# Patient Record
Sex: Male | Born: 1960 | Race: Black or African American | Hispanic: No | Marital: Single | Smoking: Current every day smoker
Health system: Southern US, Community
[De-identification: ages and names within clinical notes are randomized; demographics above are authoritative.]

## PROBLEM LIST (undated history)

## (undated) ENCOUNTER — Ambulatory Visit (HOSPITAL_COMMUNITY): Admission: EM | Disposition: A | Discharge status: Home / Self Care | Payer: Medicare Other

## (undated) ENCOUNTER — Ambulatory Visit (HOSPITAL_COMMUNITY): Admission: EM | Payer: Medicare Other | Source: Home / Self Care

## (undated) DIAGNOSIS — R112 Nausea with vomiting, unspecified: Secondary | ICD-10-CM

## (undated) DIAGNOSIS — G4730 Sleep apnea, unspecified: Secondary | ICD-10-CM

## (undated) DIAGNOSIS — R05 Cough: Secondary | ICD-10-CM

## (undated) DIAGNOSIS — K279 Peptic ulcer, site unspecified, unspecified as acute or chronic, without hemorrhage or perforation: Secondary | ICD-10-CM

## (undated) DIAGNOSIS — Z800 Family history of malignant neoplasm of digestive organs: Secondary | ICD-10-CM

## (undated) DIAGNOSIS — Z83719 Family history of colon polyps, unspecified: Secondary | ICD-10-CM

## (undated) DIAGNOSIS — R7989 Other specified abnormal findings of blood chemistry: Secondary | ICD-10-CM

## (undated) DIAGNOSIS — F329 Major depressive disorder, single episode, unspecified: Secondary | ICD-10-CM

## (undated) DIAGNOSIS — E118 Type 2 diabetes mellitus with unspecified complications: Secondary | ICD-10-CM

## (undated) DIAGNOSIS — J439 Emphysema, unspecified: Secondary | ICD-10-CM

## (undated) DIAGNOSIS — R1310 Dysphagia, unspecified: Secondary | ICD-10-CM

## (undated) DIAGNOSIS — I739 Peripheral vascular disease, unspecified: Secondary | ICD-10-CM

## (undated) DIAGNOSIS — J310 Chronic rhinitis: Secondary | ICD-10-CM

## (undated) DIAGNOSIS — I700 Atherosclerosis of aorta: Secondary | ICD-10-CM

## (undated) DIAGNOSIS — Z9889 Other specified postprocedural states: Secondary | ICD-10-CM

## (undated) DIAGNOSIS — B181 Chronic viral hepatitis B without delta-agent: Secondary | ICD-10-CM

## (undated) DIAGNOSIS — J4489 Other specified chronic obstructive pulmonary disease: Secondary | ICD-10-CM

## (undated) DIAGNOSIS — J189 Pneumonia, unspecified organism: Secondary | ICD-10-CM

## (undated) DIAGNOSIS — F1010 Alcohol abuse, uncomplicated: Secondary | ICD-10-CM

## (undated) DIAGNOSIS — N401 Enlarged prostate with lower urinary tract symptoms: Secondary | ICD-10-CM

## (undated) DIAGNOSIS — R059 Cough, unspecified: Secondary | ICD-10-CM

## (undated) DIAGNOSIS — Z973 Presence of spectacles and contact lenses: Secondary | ICD-10-CM

## (undated) DIAGNOSIS — K219 Gastro-esophageal reflux disease without esophagitis: Secondary | ICD-10-CM

## (undated) DIAGNOSIS — Z860100 Personal history of colon polyps, unspecified: Secondary | ICD-10-CM

## (undated) DIAGNOSIS — T7840XA Allergy, unspecified, initial encounter: Secondary | ICD-10-CM

## (undated) DIAGNOSIS — H269 Unspecified cataract: Secondary | ICD-10-CM

## (undated) DIAGNOSIS — Z972 Presence of dental prosthetic device (complete) (partial): Secondary | ICD-10-CM

## (undated) DIAGNOSIS — C3490 Malignant neoplasm of unspecified part of unspecified bronchus or lung: Secondary | ICD-10-CM

## (undated) DIAGNOSIS — F32A Depression, unspecified: Secondary | ICD-10-CM

## (undated) DIAGNOSIS — M545 Low back pain, unspecified: Secondary | ICD-10-CM

## (undated) DIAGNOSIS — J449 Chronic obstructive pulmonary disease, unspecified: Secondary | ICD-10-CM

## (undated) DIAGNOSIS — R911 Solitary pulmonary nodule: Secondary | ICD-10-CM

## (undated) DIAGNOSIS — I2510 Atherosclerotic heart disease of native coronary artery without angina pectoris: Secondary | ICD-10-CM

## (undated) DIAGNOSIS — Z8371 Family history of colonic polyps: Secondary | ICD-10-CM

## (undated) DIAGNOSIS — M1990 Unspecified osteoarthritis, unspecified site: Secondary | ICD-10-CM

## (undated) DIAGNOSIS — I509 Heart failure, unspecified: Secondary | ICD-10-CM

## (undated) DIAGNOSIS — F209 Schizophrenia, unspecified: Secondary | ICD-10-CM

## (undated) DIAGNOSIS — F419 Anxiety disorder, unspecified: Secondary | ICD-10-CM

## (undated) DIAGNOSIS — Z8619 Personal history of other infectious and parasitic diseases: Secondary | ICD-10-CM

## (undated) DIAGNOSIS — J45909 Unspecified asthma, uncomplicated: Secondary | ICD-10-CM

## (undated) DIAGNOSIS — F17200 Nicotine dependence, unspecified, uncomplicated: Secondary | ICD-10-CM

## (undated) DIAGNOSIS — N138 Other obstructive and reflux uropathy: Secondary | ICD-10-CM

## (undated) DIAGNOSIS — E785 Hyperlipidemia, unspecified: Secondary | ICD-10-CM

## (undated) DIAGNOSIS — D696 Thrombocytopenia, unspecified: Secondary | ICD-10-CM

## (undated) DIAGNOSIS — I10 Essential (primary) hypertension: Secondary | ICD-10-CM

## (undated) DIAGNOSIS — K635 Polyp of colon: Secondary | ICD-10-CM

## (undated) HISTORY — DX: Hyperlipidemia, unspecified: E78.5

## (undated) HISTORY — DX: Family history of colon polyps, unspecified: Z83.719

## (undated) HISTORY — DX: Essential (primary) hypertension: I10

## (undated) HISTORY — PX: CARPAL TUNNEL RELEASE: SHX101

## (undated) HISTORY — PX: TRACHEOSTOMY: SUR1362

## (undated) HISTORY — DX: Allergy, unspecified, initial encounter: T78.40XA

## (undated) HISTORY — DX: Chronic rhinitis: J31.0

## (undated) HISTORY — DX: Chronic obstructive pulmonary disease, unspecified: J44.9

## (undated) HISTORY — DX: Thrombocytopenia, unspecified: D69.6

## (undated) HISTORY — DX: Cough, unspecified: R05.9

## (undated) HISTORY — DX: Sleep apnea, unspecified: G47.30

## (undated) HISTORY — DX: Personal history of colonic polyps: Z86.010

## (undated) HISTORY — DX: Dysphagia, unspecified: R13.10

## (undated) HISTORY — DX: Type 2 diabetes mellitus with unspecified complications: E11.8

## (undated) HISTORY — DX: Peripheral vascular disease, unspecified: I73.9

## (undated) HISTORY — DX: Cough: R05

## (undated) HISTORY — DX: Emphysema, unspecified: J43.9

## (undated) HISTORY — DX: Family history of malignant neoplasm of digestive organs: Z80.0

## (undated) HISTORY — DX: Nicotine dependence, unspecified, uncomplicated: F17.200

## (undated) HISTORY — DX: Other specified abnormal findings of blood chemistry: R79.89

## (undated) HISTORY — DX: Polyp of colon: K63.5

## (undated) HISTORY — DX: Other specified chronic obstructive pulmonary disease: J44.89

## (undated) HISTORY — DX: Schizophrenia, unspecified: F20.9

## (undated) HISTORY — PX: CORONARY STENT PLACEMENT: SHX1402

## (undated) HISTORY — DX: Personal history of colon polyps, unspecified: Z86.0100

## (undated) HISTORY — DX: Family history of colonic polyps: Z83.71

## (undated) HISTORY — DX: Personal history of other infectious and parasitic diseases: Z86.19

## (undated) HISTORY — DX: Atherosclerosis of aorta: I70.0

## (undated) HISTORY — PX: COLONOSCOPY: SHX174

## (undated) HISTORY — DX: Unspecified cataract: H26.9

## (undated) HISTORY — DX: Other obstructive and reflux uropathy: N13.8

## (undated) HISTORY — DX: Chronic viral hepatitis B without delta-agent: B18.1

## (undated) HISTORY — DX: Low back pain, unspecified: M54.50

## (undated) HISTORY — DX: Alcohol abuse, uncomplicated: F10.10

## (undated) HISTORY — DX: Benign prostatic hyperplasia with lower urinary tract symptoms: N40.1

## (undated) HISTORY — DX: Anxiety disorder, unspecified: F41.9

## (undated) HISTORY — DX: Peptic ulcer, site unspecified, unspecified as acute or chronic, without hemorrhage or perforation: K27.9

## (undated) HISTORY — DX: Low back pain: M54.5

## (undated) HISTORY — DX: Depression, unspecified: F32.A

---
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
## 1998-06-05 ENCOUNTER — Emergency Department (HOSPITAL_COMMUNITY): Admission: EM | Admit: 1998-06-05 | Discharge: 1998-06-05 | Payer: Self-pay | Admitting: Emergency Medicine

## 1999-05-26 ENCOUNTER — Emergency Department (HOSPITAL_COMMUNITY): Admission: EM | Admit: 1999-05-26 | Discharge: 1999-05-26 | Payer: Self-pay | Admitting: Emergency Medicine

## 1999-05-26 ENCOUNTER — Encounter: Payer: Self-pay | Admitting: Emergency Medicine

## 1999-11-12 ENCOUNTER — Emergency Department (HOSPITAL_COMMUNITY): Admission: EM | Admit: 1999-11-12 | Discharge: 1999-11-12 | Payer: Self-pay | Admitting: Emergency Medicine

## 2000-03-03 ENCOUNTER — Encounter: Payer: Self-pay | Admitting: Emergency Medicine

## 2000-03-03 ENCOUNTER — Emergency Department (HOSPITAL_COMMUNITY): Admission: EM | Admit: 2000-03-03 | Discharge: 2000-03-03 | Payer: Self-pay | Admitting: Emergency Medicine

## 2000-04-19 ENCOUNTER — Emergency Department (HOSPITAL_COMMUNITY): Admission: EM | Admit: 2000-04-19 | Discharge: 2000-04-19 | Payer: Self-pay | Admitting: Emergency Medicine

## 2000-11-06 ENCOUNTER — Inpatient Hospital Stay (HOSPITAL_COMMUNITY): Admission: EM | Admit: 2000-11-06 | Discharge: 2000-11-08 | Payer: Self-pay | Admitting: Emergency Medicine

## 2000-11-08 ENCOUNTER — Inpatient Hospital Stay (HOSPITAL_COMMUNITY): Admission: EM | Admit: 2000-11-08 | Discharge: 2000-11-11 | Payer: Self-pay | Admitting: *Deleted

## 2001-01-22 ENCOUNTER — Encounter: Payer: Self-pay | Admitting: Emergency Medicine

## 2001-01-22 ENCOUNTER — Emergency Department (HOSPITAL_COMMUNITY): Admission: EM | Admit: 2001-01-22 | Discharge: 2001-01-22 | Payer: Self-pay | Admitting: Pediatrics

## 2001-08-19 ENCOUNTER — Emergency Department (HOSPITAL_COMMUNITY): Admission: EM | Admit: 2001-08-19 | Discharge: 2001-08-19 | Payer: Self-pay | Admitting: Emergency Medicine

## 2001-08-19 ENCOUNTER — Encounter: Payer: Self-pay | Admitting: Emergency Medicine

## 2001-08-23 ENCOUNTER — Inpatient Hospital Stay (HOSPITAL_COMMUNITY): Admission: EM | Admit: 2001-08-23 | Discharge: 2001-08-27 | Payer: Self-pay | Admitting: *Deleted

## 2001-12-20 ENCOUNTER — Inpatient Hospital Stay (HOSPITAL_COMMUNITY): Admission: EM | Admit: 2001-12-20 | Discharge: 2001-12-25 | Payer: Self-pay | Admitting: *Deleted

## 2002-03-22 ENCOUNTER — Inpatient Hospital Stay (HOSPITAL_COMMUNITY): Admission: EM | Admit: 2002-03-22 | Discharge: 2002-03-23 | Payer: Self-pay | Admitting: Psychiatry

## 2002-06-18 ENCOUNTER — Inpatient Hospital Stay (HOSPITAL_COMMUNITY): Admission: EM | Admit: 2002-06-18 | Discharge: 2002-06-27 | Payer: Self-pay | Admitting: Psychiatry

## 2002-10-18 ENCOUNTER — Encounter: Payer: Self-pay | Admitting: Emergency Medicine

## 2002-10-19 ENCOUNTER — Inpatient Hospital Stay (HOSPITAL_COMMUNITY): Admission: EM | Admit: 2002-10-19 | Discharge: 2002-10-22 | Payer: Self-pay | Admitting: Emergency Medicine

## 2002-10-21 ENCOUNTER — Encounter: Payer: Self-pay | Admitting: *Deleted

## 2002-10-28 ENCOUNTER — Encounter: Payer: Self-pay | Admitting: Emergency Medicine

## 2002-10-28 ENCOUNTER — Emergency Department (HOSPITAL_COMMUNITY): Admission: EM | Admit: 2002-10-28 | Discharge: 2002-10-29 | Payer: Self-pay | Admitting: Emergency Medicine

## 2002-12-27 ENCOUNTER — Emergency Department (HOSPITAL_COMMUNITY): Admission: EM | Admit: 2002-12-27 | Discharge: 2002-12-27 | Payer: Self-pay | Admitting: Emergency Medicine

## 2003-03-12 ENCOUNTER — Inpatient Hospital Stay (HOSPITAL_COMMUNITY): Admission: EM | Admit: 2003-03-12 | Discharge: 2003-03-13 | Payer: Self-pay | Admitting: Psychiatry

## 2003-04-03 ENCOUNTER — Emergency Department (HOSPITAL_COMMUNITY): Admission: EM | Admit: 2003-04-03 | Discharge: 2003-04-03 | Payer: Self-pay | Admitting: Emergency Medicine

## 2003-04-03 ENCOUNTER — Encounter: Payer: Self-pay | Admitting: Emergency Medicine

## 2003-04-29 ENCOUNTER — Encounter: Admission: RE | Admit: 2003-04-29 | Discharge: 2003-04-29 | Payer: Self-pay | Admitting: Psychiatry

## 2003-06-07 ENCOUNTER — Encounter: Payer: Self-pay | Admitting: Emergency Medicine

## 2003-06-07 ENCOUNTER — Inpatient Hospital Stay (HOSPITAL_COMMUNITY): Admission: EM | Admit: 2003-06-07 | Discharge: 2003-06-08 | Payer: Self-pay | Admitting: Emergency Medicine

## 2003-06-08 ENCOUNTER — Encounter: Payer: Self-pay | Admitting: Critical Care Medicine

## 2003-10-23 ENCOUNTER — Encounter: Admission: RE | Admit: 2003-10-23 | Discharge: 2003-10-23 | Payer: Self-pay | Admitting: Psychiatry

## 2004-06-28 ENCOUNTER — Encounter (HOSPITAL_COMMUNITY): Admission: RE | Admit: 2004-06-28 | Discharge: 2004-06-28 | Payer: Self-pay | Admitting: Psychiatry

## 2004-06-28 ENCOUNTER — Ambulatory Visit (HOSPITAL_COMMUNITY): Payer: Self-pay | Admitting: Psychiatry

## 2004-08-04 ENCOUNTER — Ambulatory Visit (HOSPITAL_COMMUNITY): Payer: Self-pay | Admitting: Psychiatry

## 2004-08-27 ENCOUNTER — Ambulatory Visit (HOSPITAL_COMMUNITY): Payer: Self-pay | Admitting: Psychiatry

## 2004-09-23 ENCOUNTER — Ambulatory Visit: Payer: Self-pay | Admitting: Psychiatry

## 2004-09-23 ENCOUNTER — Inpatient Hospital Stay (HOSPITAL_COMMUNITY): Admission: RE | Admit: 2004-09-23 | Discharge: 2004-09-25 | Payer: Self-pay | Admitting: Psychiatry

## 2004-10-25 ENCOUNTER — Ambulatory Visit (HOSPITAL_COMMUNITY): Payer: Self-pay | Admitting: Psychiatry

## 2004-12-10 ENCOUNTER — Ambulatory Visit (HOSPITAL_COMMUNITY): Payer: Self-pay | Admitting: Psychiatry

## 2004-12-10 ENCOUNTER — Ambulatory Visit (HOSPITAL_COMMUNITY): Admission: RE | Admit: 2004-12-10 | Discharge: 2004-12-10 | Payer: Self-pay | Admitting: Psychiatry

## 2005-02-07 ENCOUNTER — Ambulatory Visit (HOSPITAL_COMMUNITY): Payer: Self-pay | Admitting: Psychiatry

## 2005-02-07 ENCOUNTER — Ambulatory Visit: Payer: Self-pay | Admitting: Psychiatry

## 2005-04-22 ENCOUNTER — Ambulatory Visit (HOSPITAL_COMMUNITY): Payer: Self-pay | Admitting: Psychiatry

## 2005-06-24 ENCOUNTER — Ambulatory Visit (HOSPITAL_COMMUNITY): Admission: RE | Admit: 2005-06-24 | Discharge: 2005-06-24 | Payer: Self-pay | Admitting: Psychiatry

## 2005-06-24 ENCOUNTER — Ambulatory Visit (HOSPITAL_COMMUNITY): Payer: Self-pay | Admitting: Psychiatry

## 2005-08-31 ENCOUNTER — Encounter: Admission: RE | Admit: 2005-08-31 | Discharge: 2005-08-31 | Payer: Self-pay | Admitting: Cardiology

## 2005-09-09 ENCOUNTER — Ambulatory Visit (HOSPITAL_COMMUNITY): Payer: Self-pay | Admitting: Psychiatry

## 2005-10-03 ENCOUNTER — Inpatient Hospital Stay (HOSPITAL_COMMUNITY): Admission: RE | Admit: 2005-10-03 | Discharge: 2005-10-05 | Payer: Self-pay | Admitting: Psychiatry

## 2005-10-03 ENCOUNTER — Encounter: Payer: Self-pay | Admitting: Internal Medicine

## 2005-10-04 ENCOUNTER — Ambulatory Visit: Payer: Self-pay | Admitting: Psychiatry

## 2005-11-07 ENCOUNTER — Ambulatory Visit (HOSPITAL_COMMUNITY): Payer: Self-pay | Admitting: Psychiatry

## 2005-12-27 ENCOUNTER — Inpatient Hospital Stay (HOSPITAL_COMMUNITY): Admission: RE | Admit: 2005-12-27 | Discharge: 2006-01-11 | Payer: Self-pay | Admitting: Psychiatry

## 2006-01-07 ENCOUNTER — Ambulatory Visit: Payer: Self-pay | Admitting: Psychiatry

## 2006-02-17 ENCOUNTER — Emergency Department (HOSPITAL_COMMUNITY): Admission: EM | Admit: 2006-02-17 | Discharge: 2006-02-17 | Payer: Self-pay | Admitting: Emergency Medicine

## 2006-02-18 ENCOUNTER — Inpatient Hospital Stay (HOSPITAL_COMMUNITY): Admission: EM | Admit: 2006-02-18 | Discharge: 2006-02-22 | Payer: Self-pay | Admitting: *Deleted

## 2006-02-18 ENCOUNTER — Ambulatory Visit: Payer: Self-pay | Admitting: *Deleted

## 2006-02-21 ENCOUNTER — Encounter: Payer: Self-pay | Admitting: *Deleted

## 2006-03-07 ENCOUNTER — Inpatient Hospital Stay (HOSPITAL_COMMUNITY): Admission: RE | Admit: 2006-03-07 | Discharge: 2006-03-16 | Payer: Self-pay | Admitting: Psychiatry

## 2006-03-17 ENCOUNTER — Encounter (HOSPITAL_COMMUNITY): Admission: RE | Admit: 2006-03-17 | Discharge: 2006-06-15 | Payer: Self-pay | Admitting: Psychiatry

## 2006-03-31 ENCOUNTER — Ambulatory Visit (HOSPITAL_COMMUNITY): Payer: Self-pay | Admitting: Psychiatry

## 2006-04-21 ENCOUNTER — Ambulatory Visit (HOSPITAL_COMMUNITY): Payer: Self-pay | Admitting: Psychiatry

## 2006-06-01 ENCOUNTER — Ambulatory Visit (HOSPITAL_COMMUNITY): Payer: Self-pay | Admitting: Licensed Clinical Social Worker

## 2006-06-16 ENCOUNTER — Ambulatory Visit (HOSPITAL_COMMUNITY): Payer: Self-pay | Admitting: Psychiatry

## 2006-07-11 ENCOUNTER — Ambulatory Visit (HOSPITAL_COMMUNITY): Payer: Self-pay | Admitting: Licensed Clinical Social Worker

## 2006-07-14 ENCOUNTER — Ambulatory Visit (HOSPITAL_COMMUNITY): Payer: Self-pay | Admitting: Psychiatry

## 2006-07-17 ENCOUNTER — Ambulatory Visit (HOSPITAL_COMMUNITY): Payer: Self-pay | Admitting: Licensed Clinical Social Worker

## 2006-07-21 ENCOUNTER — Ambulatory Visit (HOSPITAL_COMMUNITY): Payer: Self-pay | Admitting: Psychiatry

## 2006-07-24 ENCOUNTER — Ambulatory Visit (HOSPITAL_COMMUNITY): Payer: Self-pay | Admitting: Licensed Clinical Social Worker

## 2006-07-28 ENCOUNTER — Ambulatory Visit (HOSPITAL_COMMUNITY): Payer: Self-pay | Admitting: Psychiatry

## 2006-07-31 ENCOUNTER — Ambulatory Visit (HOSPITAL_COMMUNITY): Payer: Self-pay | Admitting: Licensed Clinical Social Worker

## 2006-08-04 ENCOUNTER — Ambulatory Visit (HOSPITAL_COMMUNITY): Payer: Self-pay | Admitting: Psychiatry

## 2006-08-07 ENCOUNTER — Ambulatory Visit (HOSPITAL_COMMUNITY): Payer: Self-pay | Admitting: Licensed Clinical Social Worker

## 2006-08-09 ENCOUNTER — Ambulatory Visit (HOSPITAL_COMMUNITY): Payer: Self-pay | Admitting: Psychiatry

## 2006-08-14 ENCOUNTER — Ambulatory Visit (HOSPITAL_COMMUNITY): Payer: Self-pay | Admitting: Licensed Clinical Social Worker

## 2006-08-16 ENCOUNTER — Ambulatory Visit (HOSPITAL_COMMUNITY): Payer: Self-pay | Admitting: Psychiatry

## 2006-08-21 ENCOUNTER — Ambulatory Visit (HOSPITAL_COMMUNITY): Payer: Self-pay | Admitting: Licensed Clinical Social Worker

## 2006-08-23 ENCOUNTER — Ambulatory Visit (HOSPITAL_COMMUNITY): Payer: Self-pay | Admitting: Psychiatry

## 2006-08-28 ENCOUNTER — Ambulatory Visit (HOSPITAL_COMMUNITY): Payer: Self-pay | Admitting: Psychiatry

## 2006-08-29 ENCOUNTER — Ambulatory Visit (HOSPITAL_COMMUNITY): Payer: Self-pay | Admitting: Licensed Clinical Social Worker

## 2006-09-04 ENCOUNTER — Ambulatory Visit (HOSPITAL_COMMUNITY): Payer: Self-pay | Admitting: Licensed Clinical Social Worker

## 2006-09-06 ENCOUNTER — Ambulatory Visit (HOSPITAL_COMMUNITY): Payer: Self-pay | Admitting: Psychiatry

## 2006-09-11 ENCOUNTER — Ambulatory Visit (HOSPITAL_COMMUNITY): Payer: Self-pay | Admitting: Licensed Clinical Social Worker

## 2006-09-20 ENCOUNTER — Ambulatory Visit (HOSPITAL_COMMUNITY): Payer: Self-pay | Admitting: Psychiatry

## 2006-09-27 ENCOUNTER — Ambulatory Visit (HOSPITAL_COMMUNITY): Payer: Self-pay | Admitting: Psychiatry

## 2006-09-28 ENCOUNTER — Ambulatory Visit (HOSPITAL_COMMUNITY): Payer: Self-pay | Admitting: Licensed Clinical Social Worker

## 2006-10-10 ENCOUNTER — Ambulatory Visit (HOSPITAL_COMMUNITY): Payer: Self-pay | Admitting: Licensed Clinical Social Worker

## 2006-10-18 ENCOUNTER — Ambulatory Visit (HOSPITAL_COMMUNITY): Payer: Self-pay | Admitting: Psychiatry

## 2006-10-23 ENCOUNTER — Ambulatory Visit (HOSPITAL_COMMUNITY): Payer: Self-pay | Admitting: Licensed Clinical Social Worker

## 2006-11-07 ENCOUNTER — Ambulatory Visit (HOSPITAL_COMMUNITY): Payer: Self-pay | Admitting: Licensed Clinical Social Worker

## 2006-11-08 ENCOUNTER — Ambulatory Visit (HOSPITAL_COMMUNITY): Payer: Self-pay | Admitting: Psychiatry

## 2006-11-15 ENCOUNTER — Ambulatory Visit (HOSPITAL_COMMUNITY): Payer: Self-pay | Admitting: Psychiatry

## 2006-11-20 IMAGING — CR DG CHEST 2V
2 series · 2 of 2 positions shown · non-contrast
Comparison: 04/03/03.

CLINICAL DATA: Cough.  
 CHEST, TWO VIEWS:

[w chest pa]
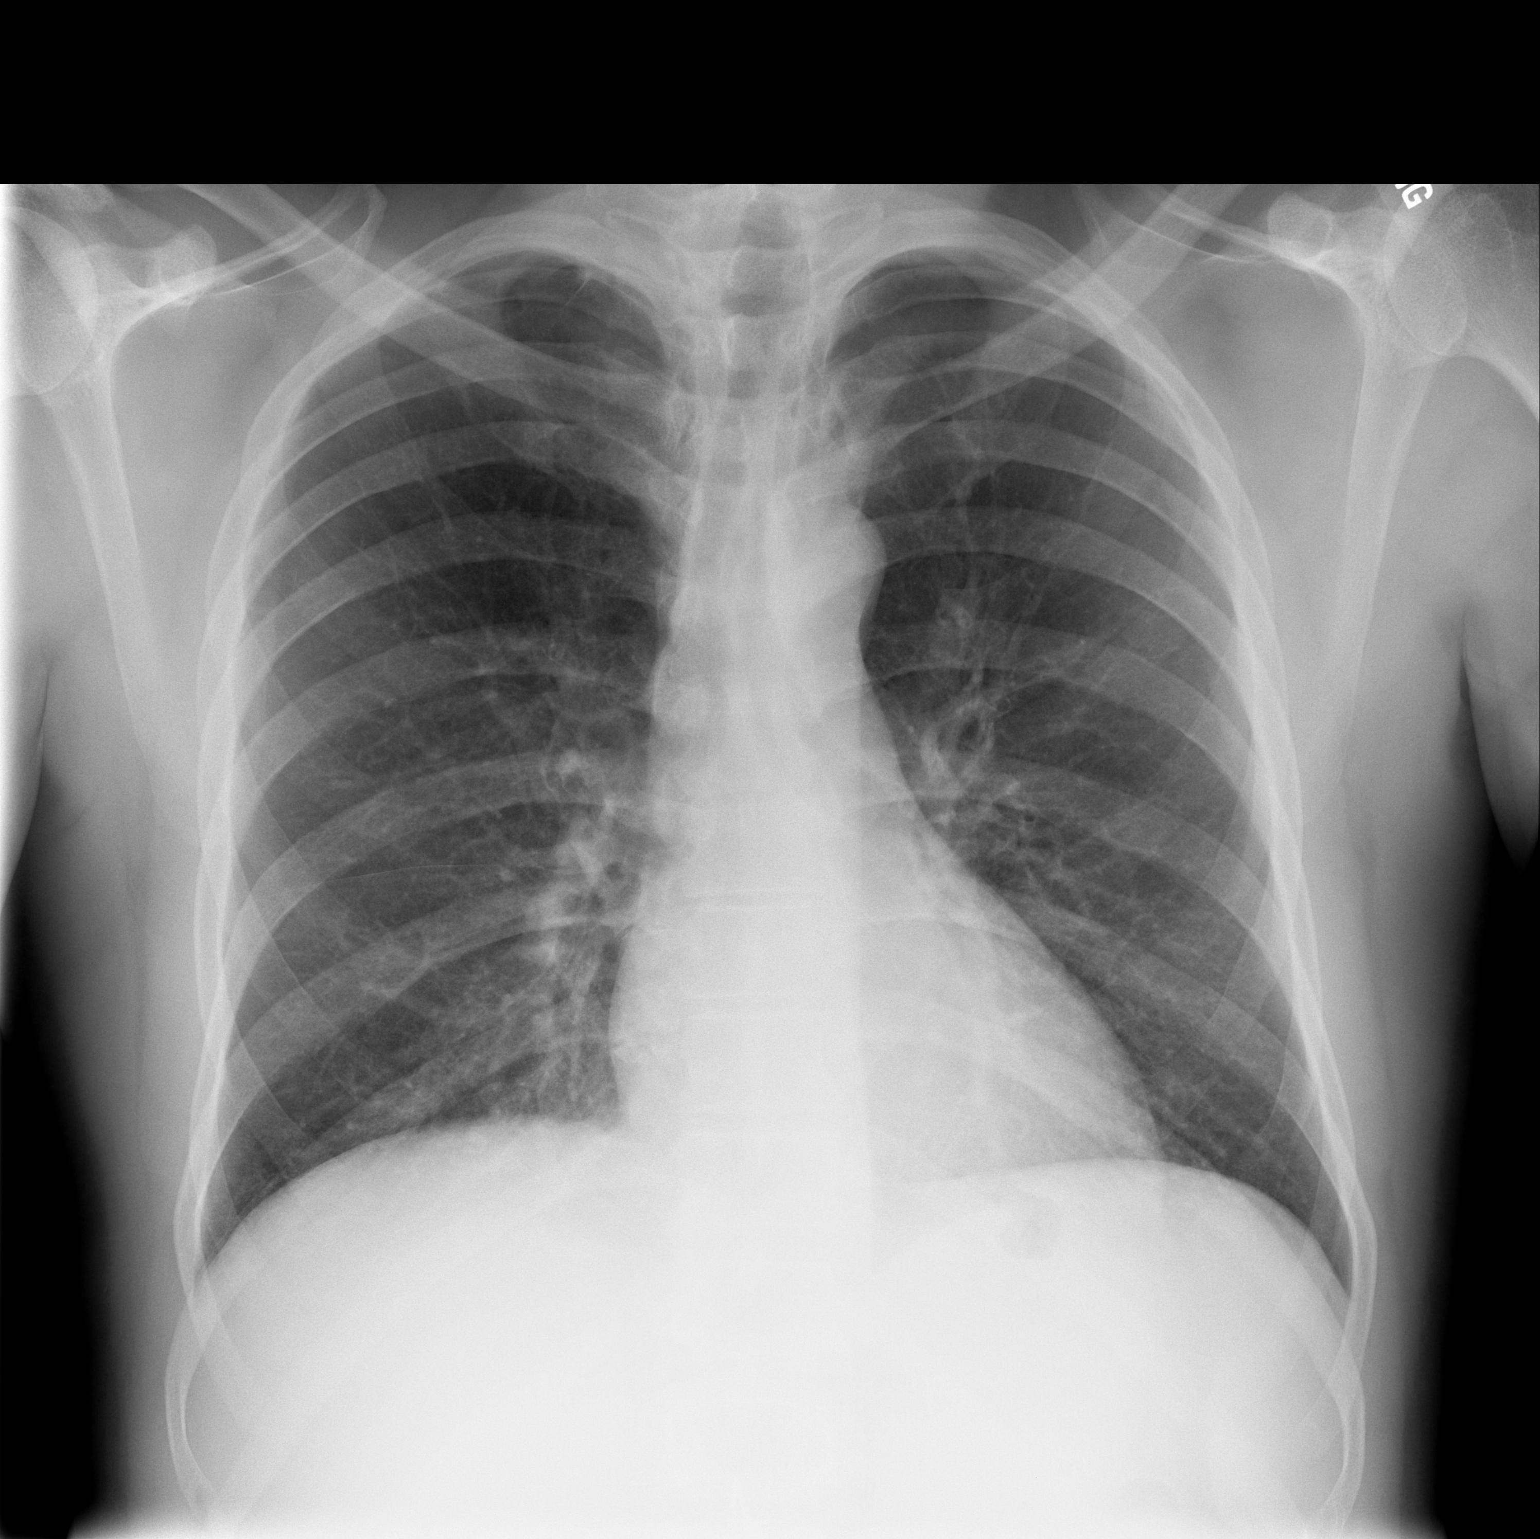

[w chest lat]
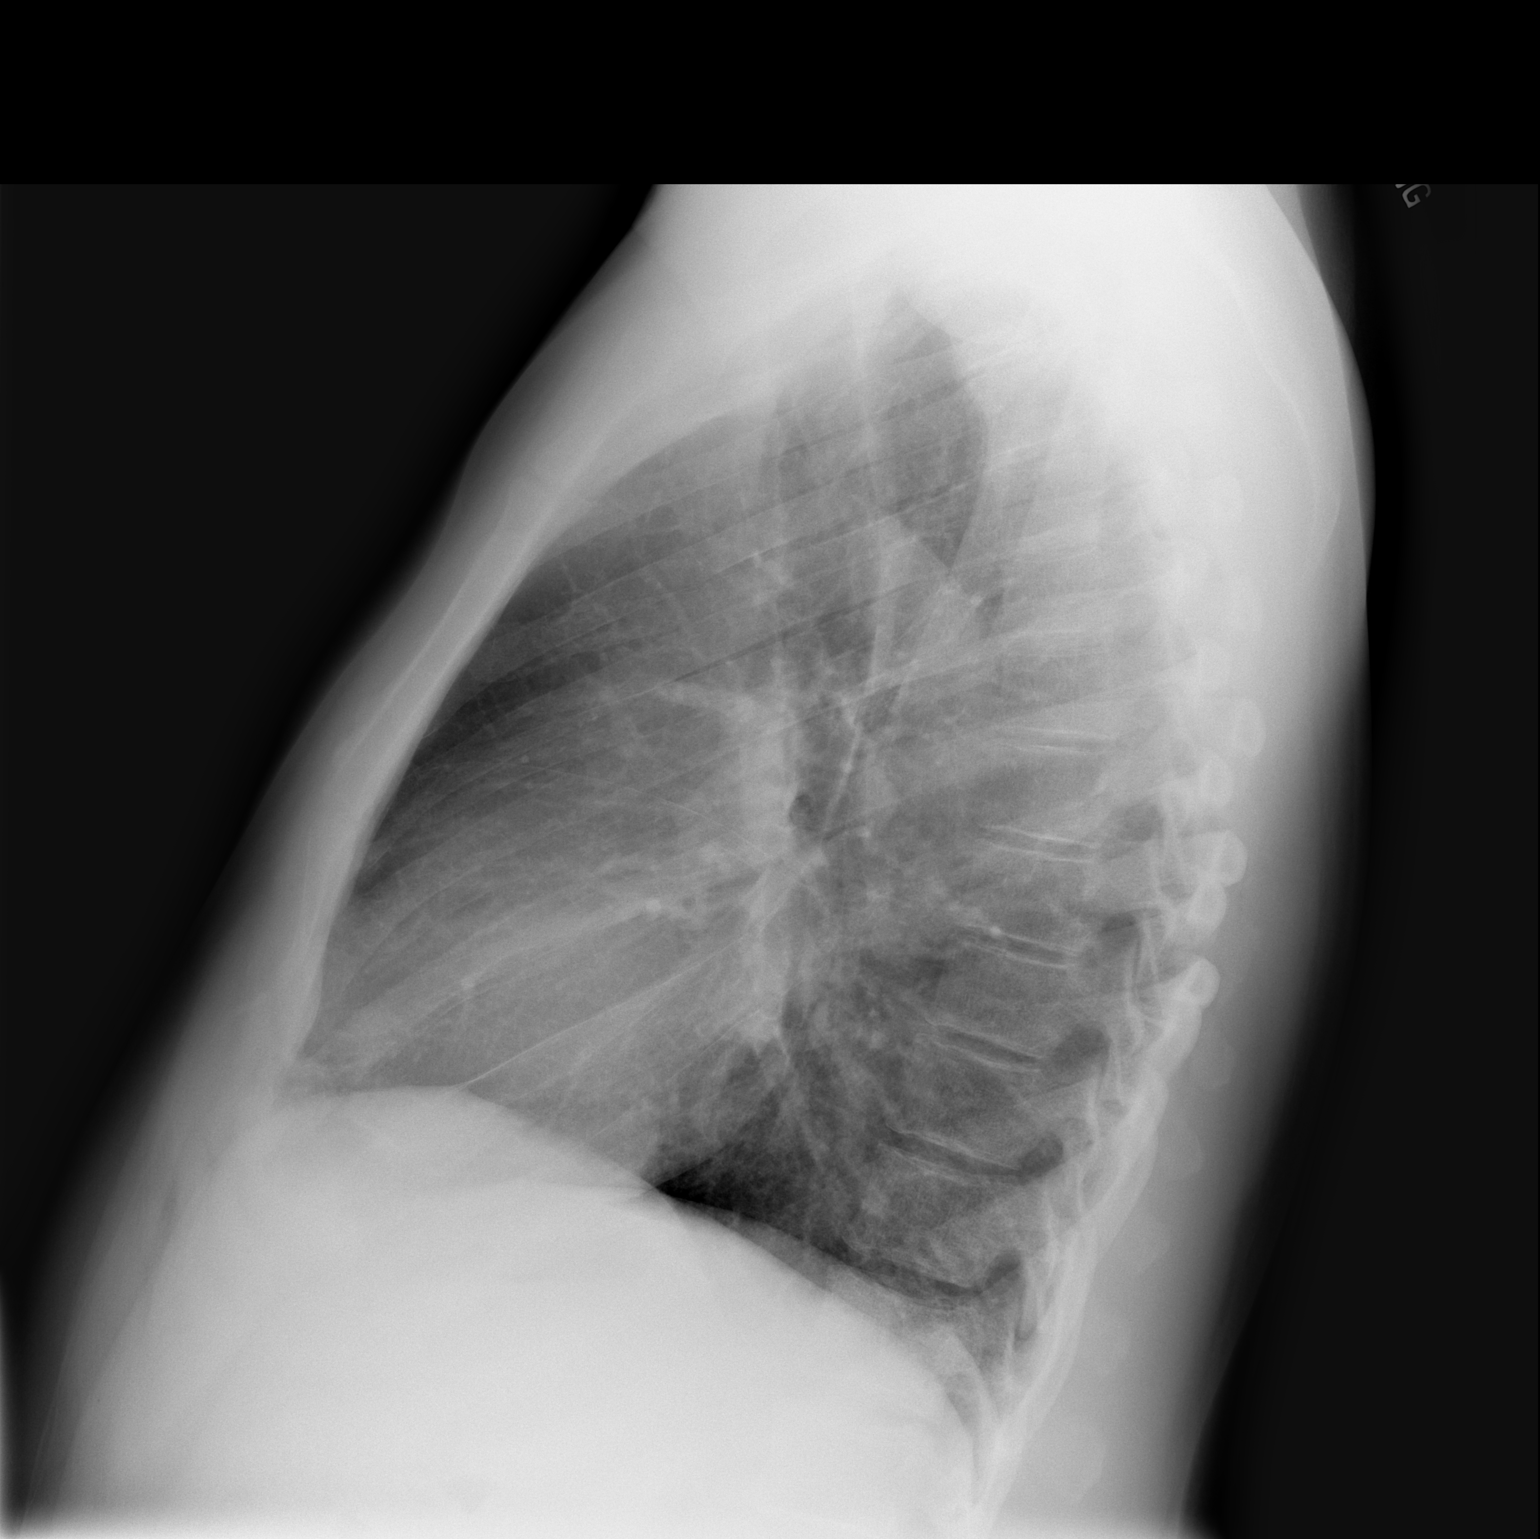

[2 of 2 positions shown; findings below may reference images not displayed]

Heart and vascularity normal.  Mild peribronchial thickening without active air space disease or pleural fluid.  Slightly accentuated basilar markings.
IMPRESSION: Bronchitic changes ? no acute air space disease.

## 2006-11-21 ENCOUNTER — Ambulatory Visit (HOSPITAL_COMMUNITY): Payer: Self-pay | Admitting: Licensed Clinical Social Worker

## 2006-11-22 ENCOUNTER — Ambulatory Visit (HOSPITAL_COMMUNITY): Payer: Self-pay | Admitting: Psychiatry

## 2006-11-29 ENCOUNTER — Ambulatory Visit (HOSPITAL_COMMUNITY): Payer: Self-pay | Admitting: Psychiatry

## 2006-12-04 ENCOUNTER — Ambulatory Visit (HOSPITAL_COMMUNITY): Payer: Self-pay | Admitting: Licensed Clinical Social Worker

## 2006-12-06 ENCOUNTER — Ambulatory Visit (HOSPITAL_COMMUNITY): Payer: Self-pay | Admitting: Psychiatry

## 2006-12-19 ENCOUNTER — Ambulatory Visit (HOSPITAL_COMMUNITY): Payer: Self-pay | Admitting: Licensed Clinical Social Worker

## 2006-12-21 ENCOUNTER — Ambulatory Visit (HOSPITAL_COMMUNITY): Payer: Self-pay | Admitting: Physician Assistant

## 2006-12-23 IMAGING — CR DG CHEST 1V PORT
1 series · 1 of 1 positions shown · non-contrast
Comparison: 08/31/05.

CLINICAL DATA: Chest pain.  
 PORTABLE ? 1 VIEW CHEST:

[view not recorded]
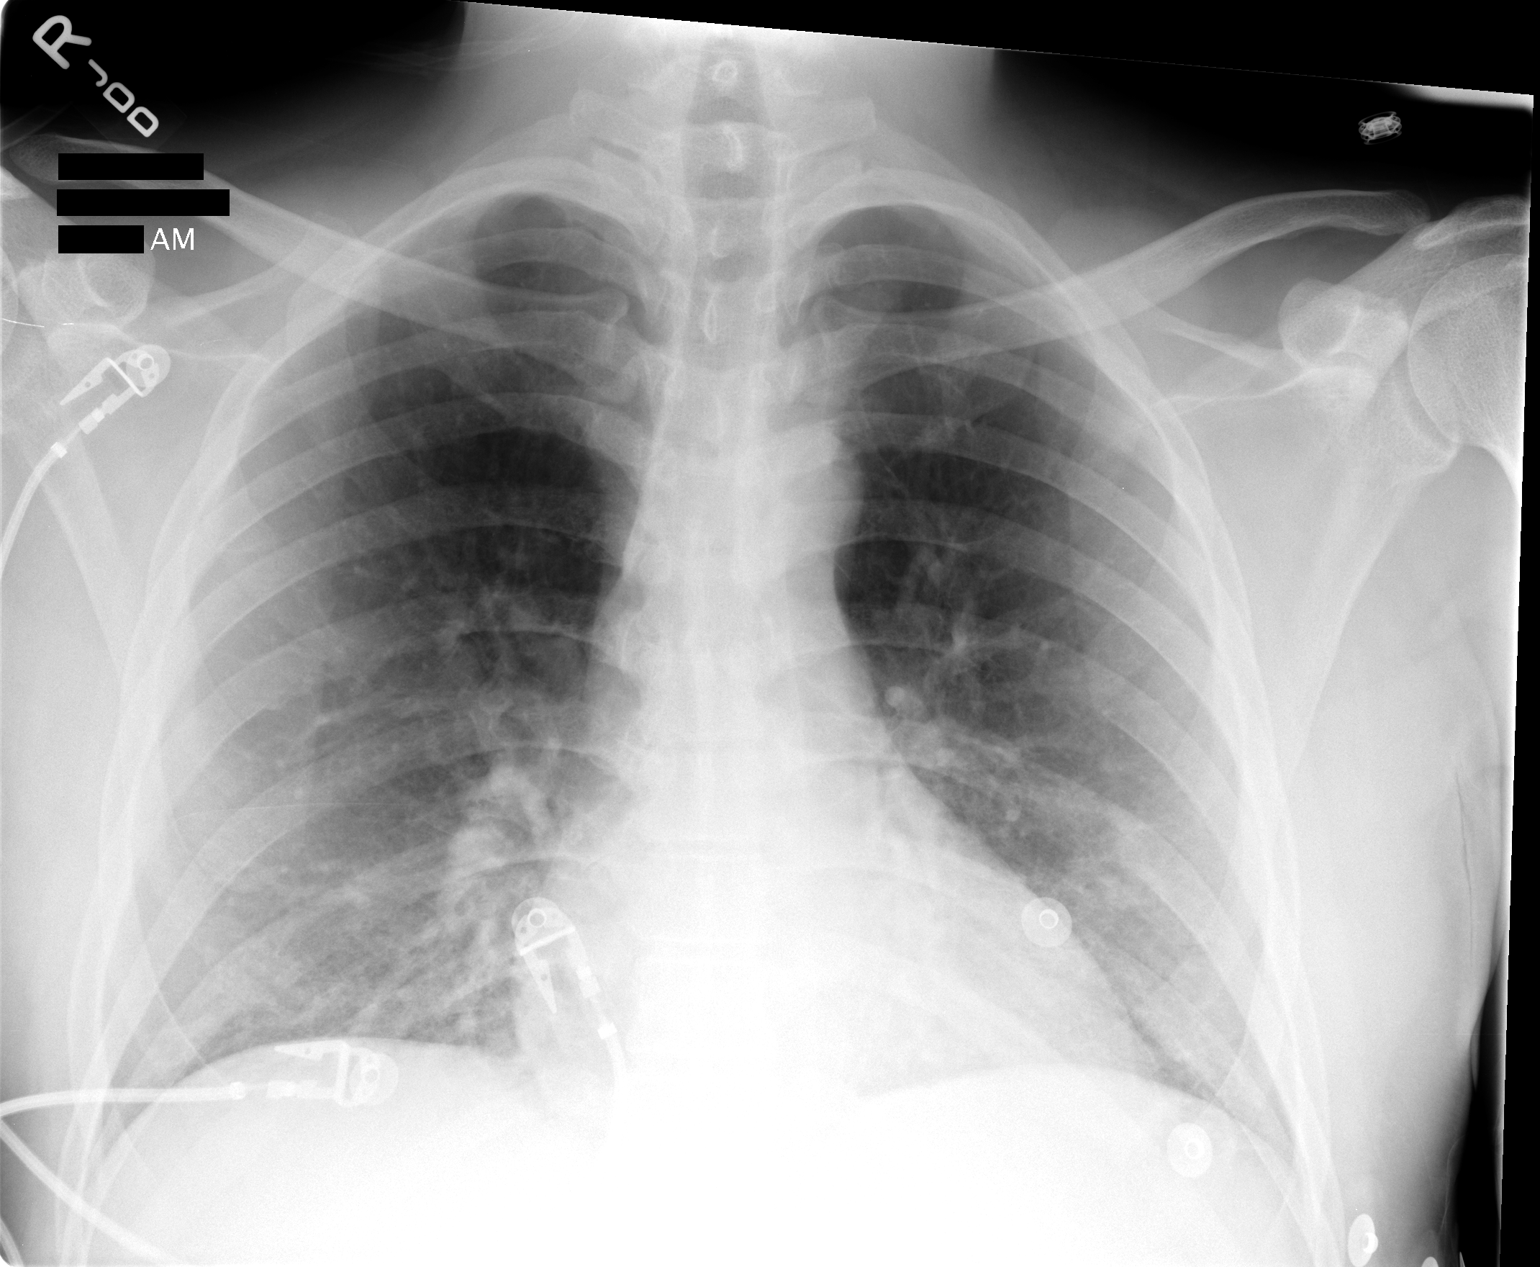

[1 of 1 positions shown; findings below may reference images not displayed]

FINDINGS: The heart size is normal.  There are no effusions or edema.  Bibasilar atelectasis is new from the prior exam.
IMPRESSION: New bibasilar atelectasis/infiltrates.

## 2007-01-08 ENCOUNTER — Ambulatory Visit (HOSPITAL_COMMUNITY): Payer: Self-pay | Admitting: Psychiatry

## 2007-01-10 ENCOUNTER — Ambulatory Visit (HOSPITAL_COMMUNITY): Payer: Self-pay | Admitting: Licensed Clinical Social Worker

## 2007-01-18 ENCOUNTER — Ambulatory Visit (HOSPITAL_COMMUNITY): Payer: Self-pay | Admitting: Licensed Clinical Social Worker

## 2007-01-23 ENCOUNTER — Ambulatory Visit (HOSPITAL_COMMUNITY): Payer: Self-pay | Admitting: Psychiatry

## 2007-01-25 ENCOUNTER — Ambulatory Visit (HOSPITAL_COMMUNITY): Payer: Self-pay | Admitting: Licensed Clinical Social Worker

## 2007-02-01 ENCOUNTER — Ambulatory Visit (HOSPITAL_COMMUNITY): Payer: Self-pay | Admitting: Licensed Clinical Social Worker

## 2007-02-08 ENCOUNTER — Ambulatory Visit (HOSPITAL_COMMUNITY): Payer: Self-pay | Admitting: Licensed Clinical Social Worker

## 2007-02-14 ENCOUNTER — Ambulatory Visit (HOSPITAL_COMMUNITY): Payer: Self-pay | Admitting: Psychiatry

## 2007-02-15 ENCOUNTER — Ambulatory Visit (HOSPITAL_COMMUNITY): Payer: Self-pay | Admitting: Licensed Clinical Social Worker

## 2007-03-08 ENCOUNTER — Ambulatory Visit (HOSPITAL_COMMUNITY): Payer: Self-pay | Admitting: Psychiatry

## 2007-03-15 ENCOUNTER — Ambulatory Visit (HOSPITAL_COMMUNITY): Payer: Self-pay | Admitting: Psychiatry

## 2007-03-18 IMAGING — CR DG CHEST 1V PORT
1 series · 1 of 1 positions shown · non-contrast
Comparison: none

CLINICAL DATA: Chest pain

Portable chest at 9986:
Comparison 10/03/2005. Improved aeration of the lung bases. No confluent
infiltrate. No overt edema. Heart size normal. No effusion. Visualized bones
unremarkable.

[view not recorded]
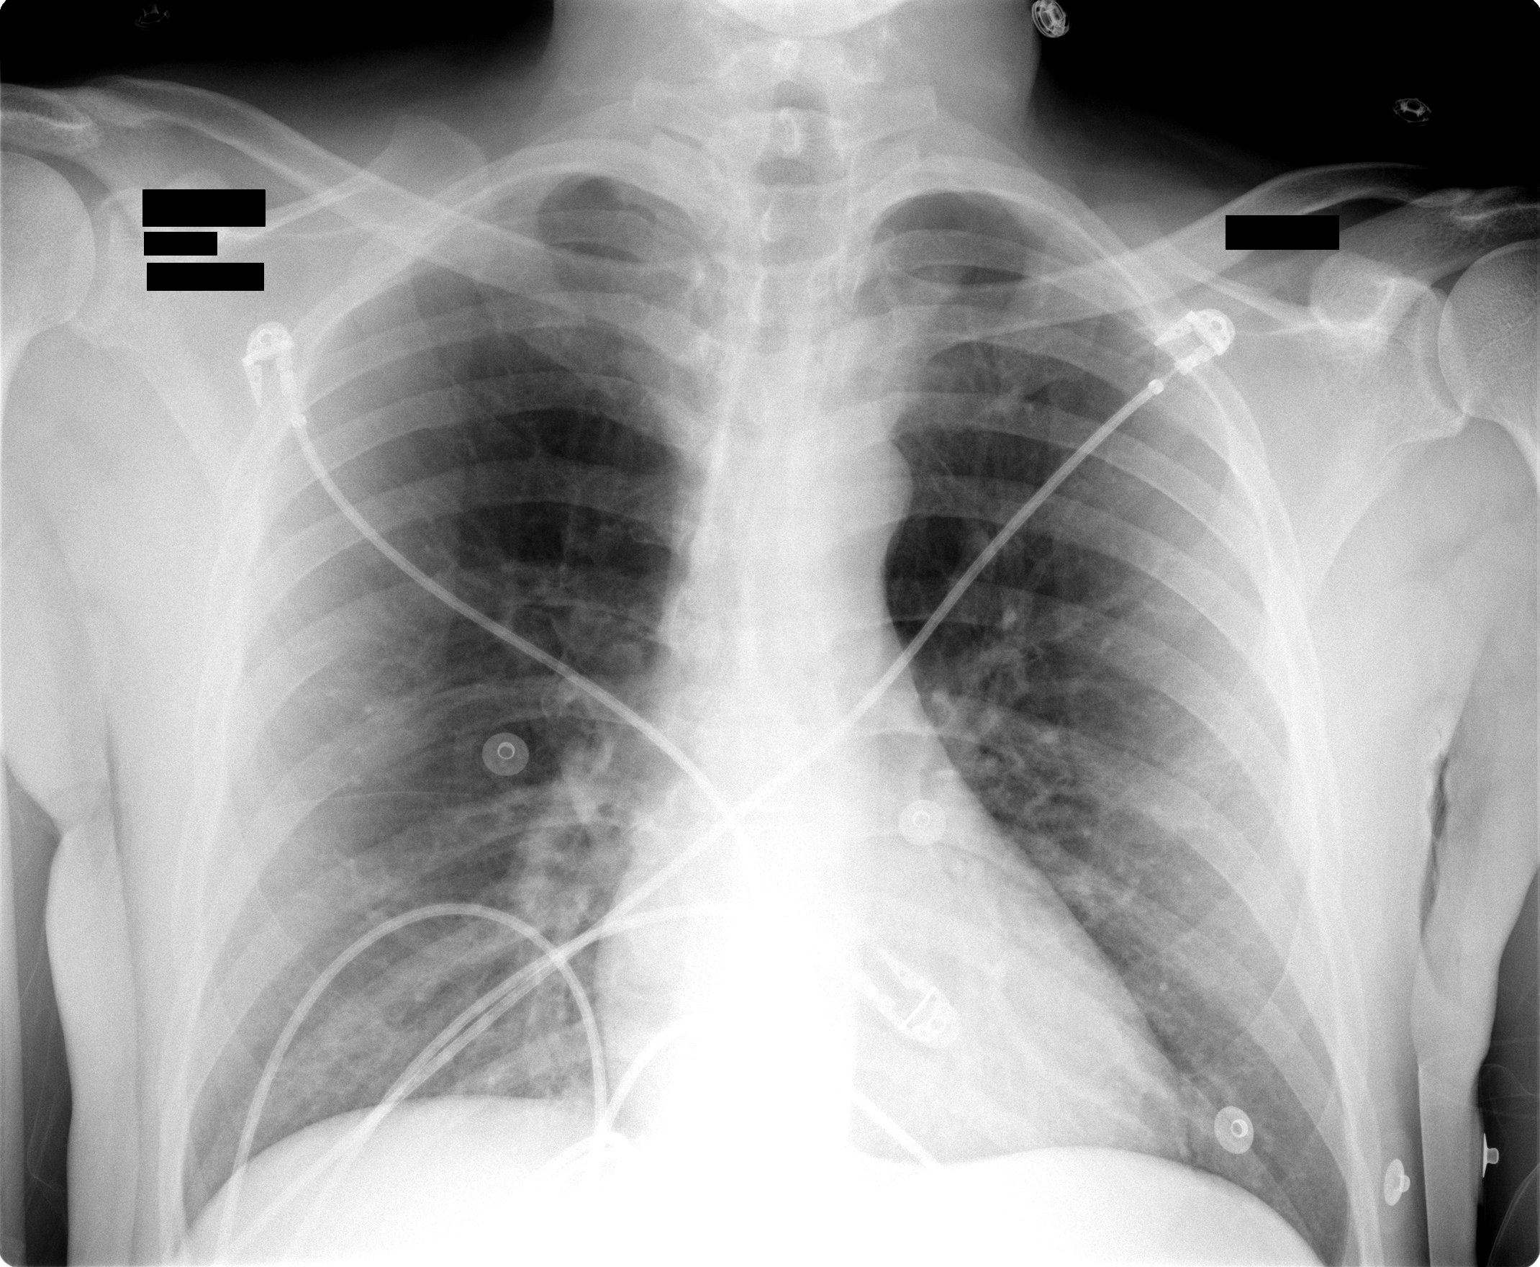

[1 of 1 positions shown; findings below may reference images not displayed]

IMPRESSION: 1. No acute disease

## 2007-03-21 ENCOUNTER — Ambulatory Visit (HOSPITAL_COMMUNITY): Payer: Self-pay | Admitting: Licensed Clinical Social Worker

## 2007-03-28 ENCOUNTER — Ambulatory Visit (HOSPITAL_COMMUNITY): Payer: Self-pay | Admitting: Licensed Clinical Social Worker

## 2007-04-03 ENCOUNTER — Ambulatory Visit (HOSPITAL_COMMUNITY): Payer: Self-pay | Admitting: Psychiatry

## 2007-04-10 ENCOUNTER — Ambulatory Visit (HOSPITAL_COMMUNITY): Payer: Self-pay | Admitting: Licensed Clinical Social Worker

## 2007-04-17 ENCOUNTER — Ambulatory Visit (HOSPITAL_COMMUNITY): Payer: Self-pay | Admitting: Licensed Clinical Social Worker

## 2007-04-26 ENCOUNTER — Ambulatory Visit (HOSPITAL_COMMUNITY): Payer: Self-pay | Admitting: Psychiatry

## 2007-04-30 ENCOUNTER — Ambulatory Visit (HOSPITAL_COMMUNITY): Payer: Self-pay | Admitting: Licensed Clinical Social Worker

## 2007-05-08 ENCOUNTER — Ambulatory Visit (HOSPITAL_COMMUNITY): Payer: Self-pay | Admitting: Licensed Clinical Social Worker

## 2007-05-13 IMAGING — US US ABDOMEN COMPLETE
1 series · 13 of 25 positions shown · non-contrast
Comparison: None.

CLINICAL DATA: History of alcohol abuse and distended abdomen.  
ABDOMEN ULTRASOUND:
TECHNIQUE: Complete abdominal ultrasound examination was performed including evaluation of the liver, gallbladder, bile ducts, pancreas, kidneys, spleen, IVC, and abdominal aorta.

[Series 1: unknown · 0.40mm/px · 13 of 81 slices shown]
[im 1/81]
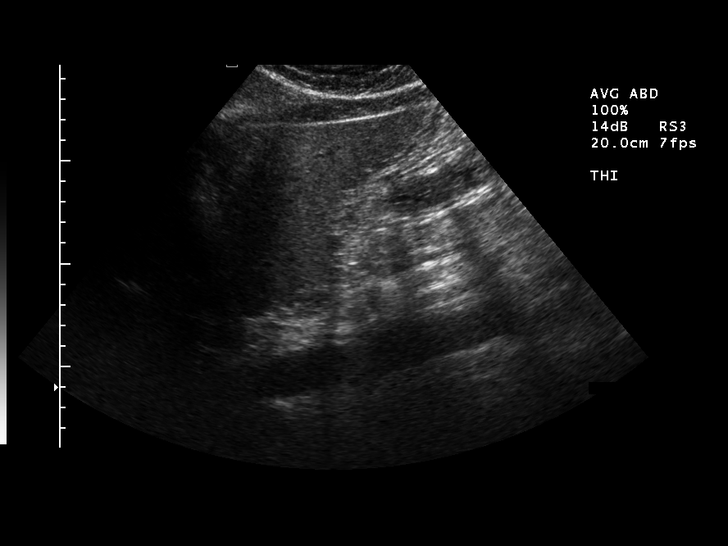
[im 7/81]
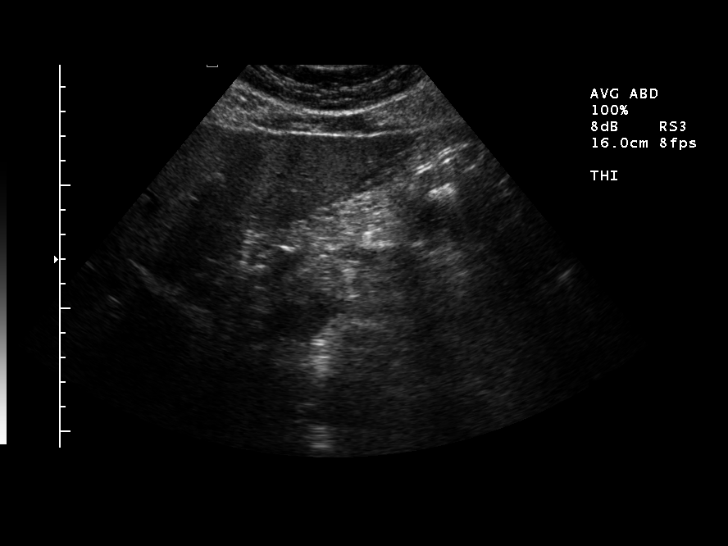
[im 14/81]
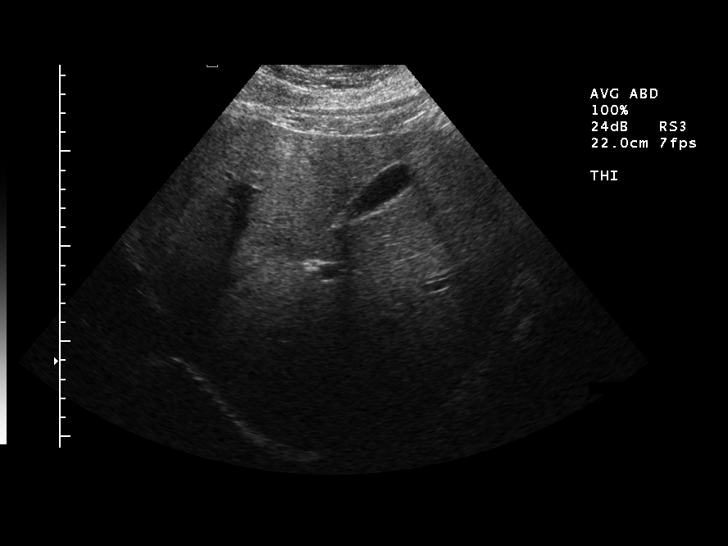
[im 21/81]
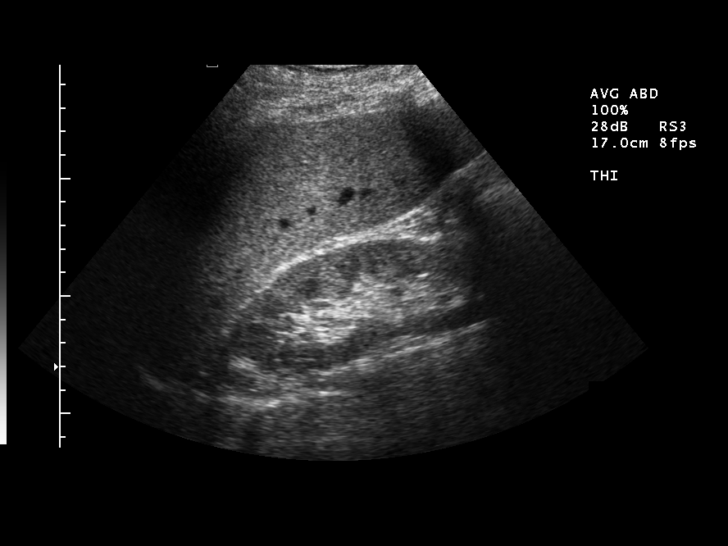
[im 27/81]
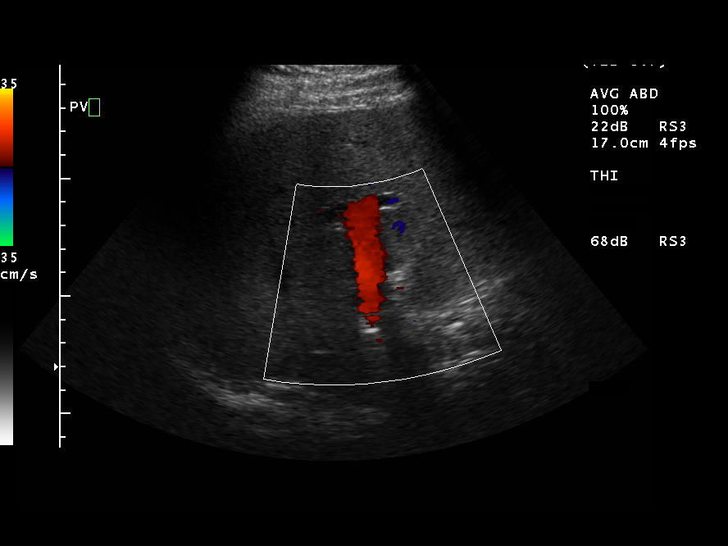
[im 34/81]
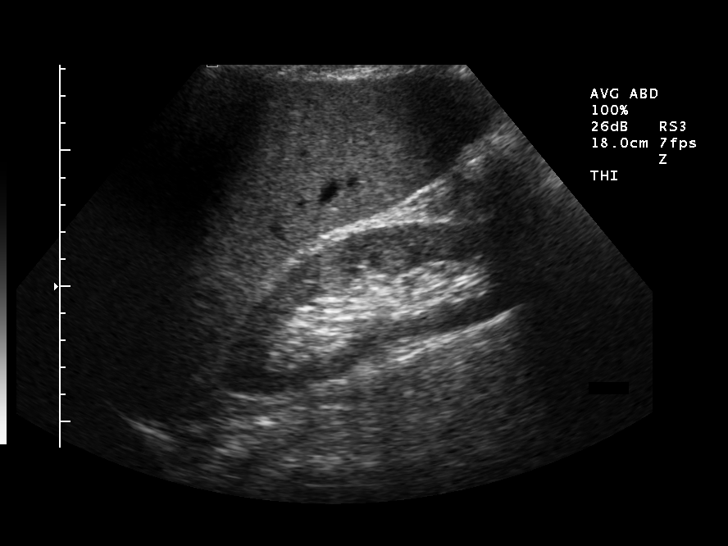
[im 41/81]
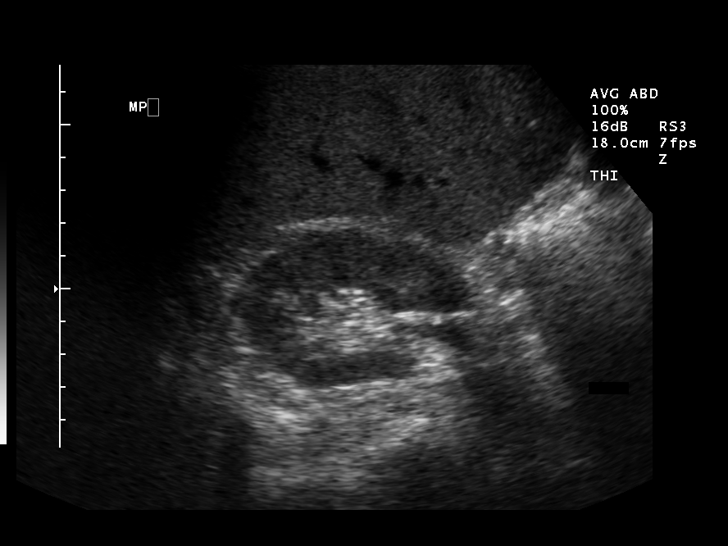
[im 47/81]
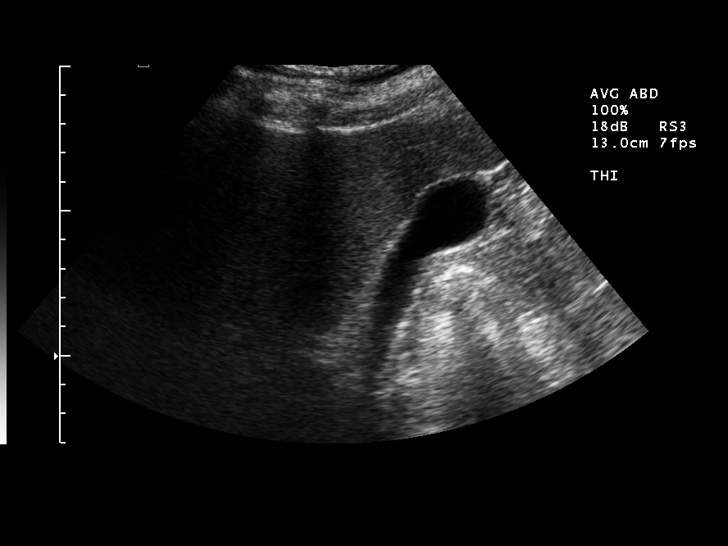
[im 54/81]
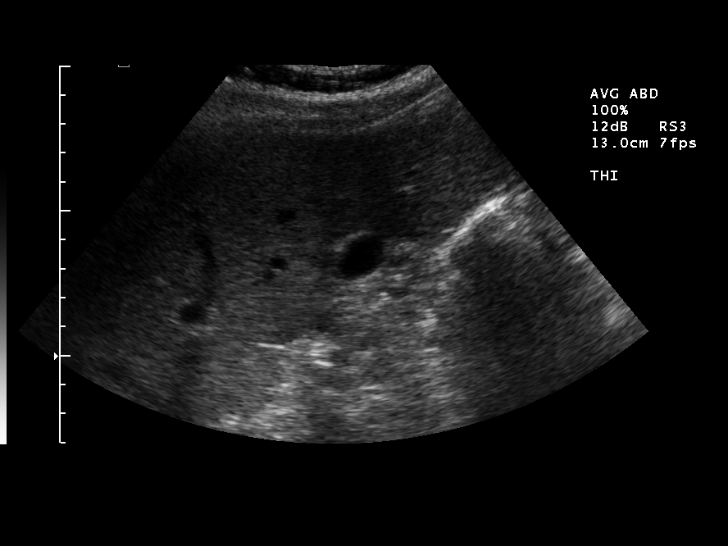
[im 61/81]
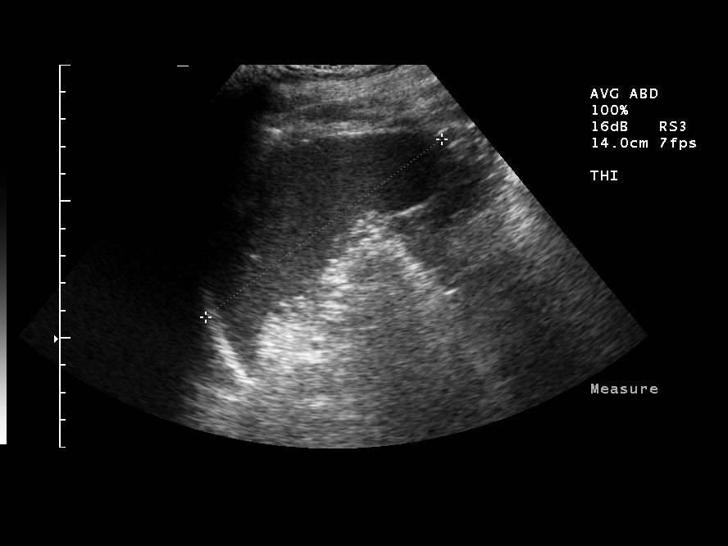
[im 67/81]
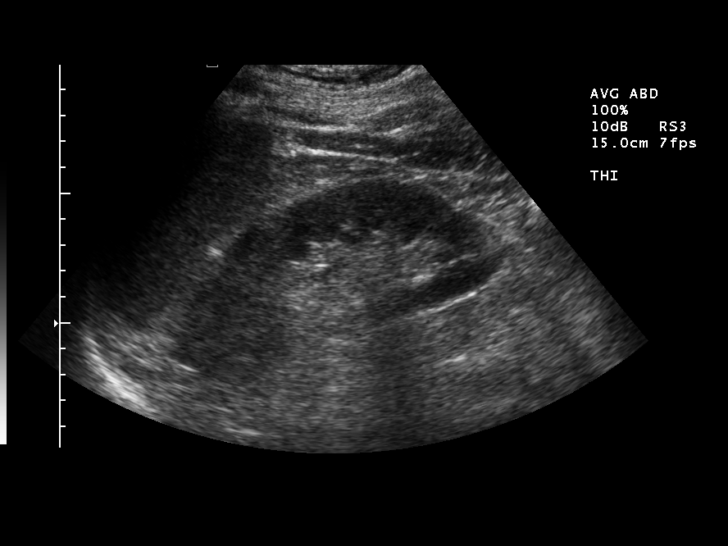
[im 74/81]
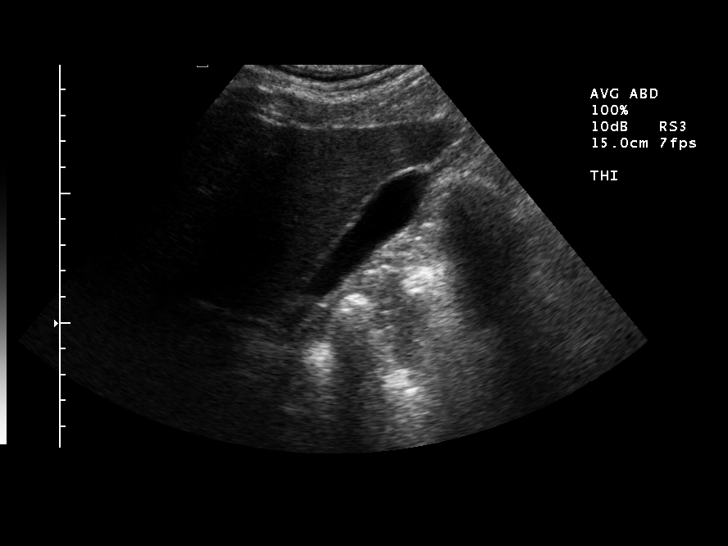
[im 81/81]
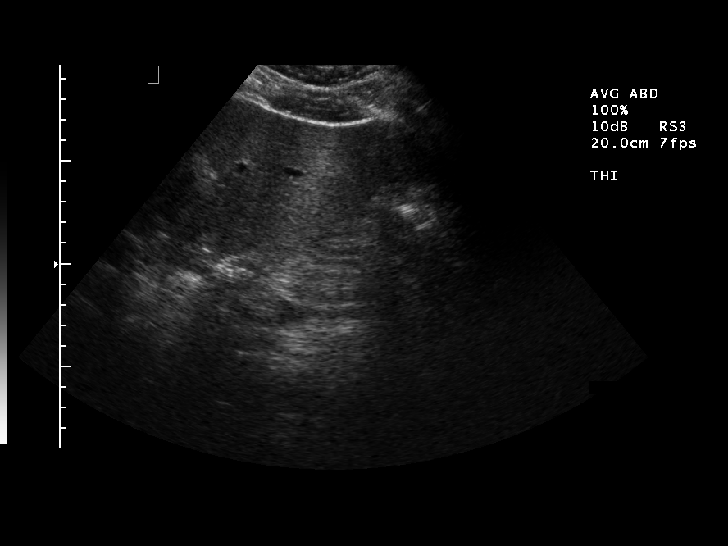

[13 of 25 positions shown; findings below may reference images not displayed]

FINDINGS: The gallbladder is normal without wall thickening, stone, or pericholecystic fluid.  Common duct normal at 5 mm.  There is no sonographic evidence of cirrhosis.  Liver normal in echo texture.  IVC normal in caliber.  Portal vein is at the upper limits of normal for size, measuring 1.3 cm.  The pancreatic tail is not well-visualized.  The pancreatic body is unremarkable.  The spleen is normal in size and echo texture at 10.8 cm.  
Right kidney 13.1 cm and left kidney 13.4 cm.  No hydronephrosis.  
The proximal abdominal aorta is non-aneurysmal.  The distal abdominal aorta is not visualized due to overlying bowel gas.
IMPRESSION: 1.  No sonographic evidence of cirrhosis.  No ascites. 
2.  The portal vein is at the upper limits of normal for size measuring 1.3 cm.  However, there is no other sign of portal venous hypertension (i.e., splenomegaly or ascites).  
3.  Limited evaluation of the pancreas and abdominal aorta.
4.  Normal appearance of the gallbladder.  No biliary ductal dilatation.

## 2007-05-15 ENCOUNTER — Ambulatory Visit (HOSPITAL_COMMUNITY): Payer: Self-pay | Admitting: Licensed Clinical Social Worker

## 2007-05-24 ENCOUNTER — Ambulatory Visit (HOSPITAL_COMMUNITY): Payer: Self-pay | Admitting: Psychiatry

## 2007-06-14 ENCOUNTER — Ambulatory Visit (HOSPITAL_COMMUNITY): Payer: Self-pay | Admitting: Licensed Clinical Social Worker

## 2007-06-18 ENCOUNTER — Ambulatory Visit (HOSPITAL_COMMUNITY): Payer: Self-pay | Admitting: Psychiatry

## 2007-06-19 ENCOUNTER — Ambulatory Visit (HOSPITAL_COMMUNITY): Payer: Self-pay | Admitting: Licensed Clinical Social Worker

## 2007-06-26 ENCOUNTER — Ambulatory Visit (HOSPITAL_COMMUNITY): Payer: Self-pay | Admitting: Licensed Clinical Social Worker

## 2007-07-09 ENCOUNTER — Ambulatory Visit (HOSPITAL_COMMUNITY): Payer: Self-pay | Admitting: Psychiatry

## 2007-07-10 ENCOUNTER — Ambulatory Visit (HOSPITAL_COMMUNITY): Payer: Self-pay | Admitting: Licensed Clinical Social Worker

## 2007-07-13 ENCOUNTER — Emergency Department (HOSPITAL_COMMUNITY): Admission: EM | Admit: 2007-07-13 | Discharge: 2007-07-13 | Payer: Self-pay | Admitting: Emergency Medicine

## 2007-07-25 ENCOUNTER — Ambulatory Visit (HOSPITAL_COMMUNITY): Payer: Self-pay | Admitting: Psychiatry

## 2007-08-08 ENCOUNTER — Ambulatory Visit (HOSPITAL_COMMUNITY): Payer: Self-pay | Admitting: Licensed Clinical Social Worker

## 2007-08-09 ENCOUNTER — Ambulatory Visit (HOSPITAL_COMMUNITY): Payer: Self-pay | Admitting: Psychiatry

## 2007-08-14 ENCOUNTER — Ambulatory Visit (HOSPITAL_COMMUNITY): Payer: Self-pay | Admitting: Licensed Clinical Social Worker

## 2007-08-16 ENCOUNTER — Ambulatory Visit (HOSPITAL_COMMUNITY): Payer: Self-pay | Admitting: Psychiatry

## 2007-08-21 ENCOUNTER — Ambulatory Visit (HOSPITAL_COMMUNITY): Payer: Self-pay | Admitting: Licensed Clinical Social Worker

## 2007-08-22 ENCOUNTER — Encounter: Admission: RE | Admit: 2007-08-22 | Discharge: 2007-08-22 | Payer: Self-pay | Admitting: Cardiology

## 2007-08-28 ENCOUNTER — Ambulatory Visit (HOSPITAL_COMMUNITY): Payer: Self-pay | Admitting: Licensed Clinical Social Worker

## 2007-08-29 ENCOUNTER — Encounter: Payer: Self-pay | Admitting: Cardiovascular Disease

## 2007-08-29 HISTORY — PX: CARDIAC CATHETERIZATION: SHX172

## 2007-09-04 ENCOUNTER — Ambulatory Visit (HOSPITAL_COMMUNITY): Payer: Self-pay | Admitting: Licensed Clinical Social Worker

## 2007-09-06 ENCOUNTER — Ambulatory Visit (HOSPITAL_COMMUNITY): Payer: Self-pay | Admitting: Psychiatry

## 2007-09-24 ENCOUNTER — Inpatient Hospital Stay (HOSPITAL_COMMUNITY): Admission: EM | Admit: 2007-09-24 | Discharge: 2007-10-19 | Payer: Self-pay | Admitting: Emergency Medicine

## 2007-09-24 ENCOUNTER — Ambulatory Visit: Payer: Self-pay | Admitting: Pulmonary Disease

## 2007-09-25 ENCOUNTER — Encounter (INDEPENDENT_AMBULATORY_CARE_PROVIDER_SITE_OTHER): Payer: Self-pay | Admitting: *Deleted

## 2007-10-02 ENCOUNTER — Encounter (INDEPENDENT_AMBULATORY_CARE_PROVIDER_SITE_OTHER): Payer: Self-pay | Admitting: Internal Medicine

## 2007-10-02 ENCOUNTER — Ambulatory Visit: Payer: Self-pay | Admitting: Infectious Diseases

## 2007-11-08 ENCOUNTER — Ambulatory Visit (HOSPITAL_COMMUNITY): Payer: Self-pay | Admitting: Psychiatry

## 2007-11-15 ENCOUNTER — Ambulatory Visit (HOSPITAL_COMMUNITY): Payer: Self-pay | Admitting: Psychiatry

## 2007-11-18 ENCOUNTER — Emergency Department (HOSPITAL_COMMUNITY): Admission: EM | Admit: 2007-11-18 | Discharge: 2007-11-18 | Payer: Self-pay | Admitting: Family Medicine

## 2007-11-19 ENCOUNTER — Ambulatory Visit: Payer: Self-pay | Admitting: Internal Medicine

## 2007-11-27 ENCOUNTER — Emergency Department (HOSPITAL_COMMUNITY): Admission: EM | Admit: 2007-11-27 | Discharge: 2007-11-27 | Payer: Self-pay | Admitting: Emergency Medicine

## 2007-11-29 ENCOUNTER — Ambulatory Visit (HOSPITAL_COMMUNITY): Payer: Self-pay | Admitting: Licensed Clinical Social Worker

## 2007-12-03 ENCOUNTER — Ambulatory Visit: Payer: Self-pay | Admitting: Internal Medicine

## 2007-12-04 ENCOUNTER — Ambulatory Visit (HOSPITAL_COMMUNITY): Payer: Self-pay | Admitting: Licensed Clinical Social Worker

## 2007-12-17 ENCOUNTER — Ambulatory Visit (HOSPITAL_COMMUNITY): Payer: Self-pay | Admitting: Licensed Clinical Social Worker

## 2007-12-18 ENCOUNTER — Ambulatory Visit (HOSPITAL_COMMUNITY): Payer: Self-pay | Admitting: Psychiatry

## 2007-12-25 ENCOUNTER — Ambulatory Visit: Payer: Self-pay | Admitting: Oncology

## 2007-12-25 ENCOUNTER — Ambulatory Visit (HOSPITAL_COMMUNITY): Payer: Self-pay | Admitting: Licensed Clinical Social Worker

## 2007-12-27 ENCOUNTER — Ambulatory Visit (HOSPITAL_COMMUNITY): Payer: Self-pay | Admitting: Psychiatry

## 2008-01-02 ENCOUNTER — Emergency Department (HOSPITAL_COMMUNITY): Admission: EM | Admit: 2008-01-02 | Discharge: 2008-01-02 | Payer: Self-pay | Admitting: Family Medicine

## 2008-01-08 ENCOUNTER — Ambulatory Visit (HOSPITAL_COMMUNITY): Payer: Self-pay | Admitting: Licensed Clinical Social Worker

## 2008-01-15 ENCOUNTER — Ambulatory Visit (HOSPITAL_COMMUNITY): Payer: Self-pay | Admitting: Licensed Clinical Social Worker

## 2008-01-22 ENCOUNTER — Ambulatory Visit (HOSPITAL_COMMUNITY): Payer: Self-pay | Admitting: Licensed Clinical Social Worker

## 2008-01-24 ENCOUNTER — Ambulatory Visit (HOSPITAL_COMMUNITY): Payer: Self-pay | Admitting: Psychiatry

## 2008-02-05 ENCOUNTER — Ambulatory Visit (HOSPITAL_COMMUNITY): Payer: Self-pay | Admitting: Licensed Clinical Social Worker

## 2008-02-12 ENCOUNTER — Ambulatory Visit (HOSPITAL_COMMUNITY): Payer: Self-pay | Admitting: Licensed Clinical Social Worker

## 2008-02-13 ENCOUNTER — Emergency Department (HOSPITAL_COMMUNITY): Admission: EM | Admit: 2008-02-13 | Discharge: 2008-02-14 | Payer: Self-pay | Admitting: Emergency Medicine

## 2008-02-26 ENCOUNTER — Encounter: Payer: Self-pay | Admitting: Internal Medicine

## 2008-03-04 ENCOUNTER — Ambulatory Visit (HOSPITAL_COMMUNITY): Payer: Self-pay | Admitting: Licensed Clinical Social Worker

## 2008-03-11 ENCOUNTER — Ambulatory Visit (HOSPITAL_COMMUNITY): Payer: Self-pay | Admitting: Licensed Clinical Social Worker

## 2008-03-18 ENCOUNTER — Ambulatory Visit (HOSPITAL_COMMUNITY): Payer: Self-pay | Admitting: Licensed Clinical Social Worker

## 2008-04-03 ENCOUNTER — Ambulatory Visit (HOSPITAL_COMMUNITY): Payer: Self-pay | Admitting: Licensed Clinical Social Worker

## 2008-04-10 ENCOUNTER — Ambulatory Visit (HOSPITAL_COMMUNITY): Payer: Self-pay | Admitting: Licensed Clinical Social Worker

## 2008-04-11 ENCOUNTER — Emergency Department (HOSPITAL_COMMUNITY): Admission: EM | Admit: 2008-04-11 | Discharge: 2008-04-12 | Payer: Self-pay | Admitting: Emergency Medicine

## 2008-04-16 ENCOUNTER — Ambulatory Visit (HOSPITAL_COMMUNITY): Admission: RE | Admit: 2008-04-16 | Discharge: 2008-04-16 | Payer: Self-pay | Admitting: *Deleted

## 2008-04-21 ENCOUNTER — Ambulatory Visit (HOSPITAL_COMMUNITY): Payer: Self-pay | Admitting: Licensed Clinical Social Worker

## 2008-05-02 ENCOUNTER — Ambulatory Visit (HOSPITAL_COMMUNITY): Payer: Self-pay | Admitting: Psychiatry

## 2008-05-05 ENCOUNTER — Ambulatory Visit (HOSPITAL_COMMUNITY): Payer: Self-pay | Admitting: Licensed Clinical Social Worker

## 2008-05-19 ENCOUNTER — Ambulatory Visit (HOSPITAL_COMMUNITY): Payer: Self-pay | Admitting: Licensed Clinical Social Worker

## 2008-06-01 ENCOUNTER — Inpatient Hospital Stay (HOSPITAL_COMMUNITY): Admission: EM | Admit: 2008-06-01 | Discharge: 2008-06-04 | Payer: Self-pay | Admitting: Emergency Medicine

## 2008-06-09 ENCOUNTER — Emergency Department (HOSPITAL_COMMUNITY): Admission: EM | Admit: 2008-06-09 | Discharge: 2008-06-10 | Payer: Self-pay | Admitting: Emergency Medicine

## 2008-06-11 ENCOUNTER — Ambulatory Visit (HOSPITAL_COMMUNITY): Payer: Self-pay | Admitting: Licensed Clinical Social Worker

## 2008-06-17 ENCOUNTER — Encounter (HOSPITAL_COMMUNITY): Admission: RE | Admit: 2008-06-17 | Discharge: 2008-09-04 | Payer: Self-pay | Admitting: Cardiology

## 2008-06-23 ENCOUNTER — Ambulatory Visit: Payer: Self-pay | Admitting: Internal Medicine

## 2008-06-23 DIAGNOSIS — R7303 Prediabetes: Secondary | ICD-10-CM | POA: Insufficient documentation

## 2008-06-23 DIAGNOSIS — N401 Enlarged prostate with lower urinary tract symptoms: Secondary | ICD-10-CM | POA: Insufficient documentation

## 2008-06-23 DIAGNOSIS — M545 Low back pain, unspecified: Secondary | ICD-10-CM | POA: Insufficient documentation

## 2008-06-23 DIAGNOSIS — R351 Nocturia: Secondary | ICD-10-CM

## 2008-06-23 DIAGNOSIS — E785 Hyperlipidemia, unspecified: Secondary | ICD-10-CM | POA: Insufficient documentation

## 2008-06-23 LAB — CONVERTED CEMR LAB
ALT: 20 units/L (ref 0–53)
BUN: 5 mg/dL — ABNORMAL LOW (ref 6–23)
Bilirubin Urine: NEGATIVE
CO2: 26 meq/L (ref 19–32)
Calcium: 9.3 mg/dL (ref 8.4–10.5)
Cholesterol: 236 mg/dL (ref 0–200)
Creatinine, Ser: 0.7 mg/dL (ref 0.4–1.5)
Direct LDL: 192.2 mg/dL
Glucose, Bld: 136 mg/dL — ABNORMAL HIGH (ref 70–99)
HCT: 42 % (ref 39.0–52.0)
HCV Ab: NEGATIVE
HIV-1 antibody: NEGATIVE
HIV-2 Ab: NEGATIVE
Hemoglobin, Urine: NEGATIVE
Hemoglobin: 14.5 g/dL (ref 13.0–17.0)
Hep B S Ab: POSITIVE — AB
Hgb A1c MFr Bld: 12.5 % — ABNORMAL HIGH (ref 4.6–6.0)
Monocytes Absolute: 0.4 10*3/uL (ref 0.1–1.0)
Monocytes Relative: 7 % (ref 3.0–12.0)
Neutro Abs: 3.1 10*3/uL (ref 1.4–7.7)
Nitrite: NEGATIVE
RDW: 15.5 % — ABNORMAL HIGH (ref 11.5–14.6)
Total CHOL/HDL Ratio: 7.6
Total Protein, Urine: NEGATIVE mg/dL
Total Protein: 7.2 g/dL (ref 6.0–8.3)
Urine Glucose: NEGATIVE mg/dL

## 2008-06-24 ENCOUNTER — Encounter: Payer: Self-pay | Admitting: Internal Medicine

## 2008-06-25 ENCOUNTER — Telehealth: Payer: Self-pay | Admitting: Internal Medicine

## 2008-06-25 ENCOUNTER — Ambulatory Visit (HOSPITAL_COMMUNITY): Payer: Self-pay | Admitting: Psychiatry

## 2008-06-25 ENCOUNTER — Encounter: Payer: Self-pay | Admitting: Internal Medicine

## 2008-06-27 ENCOUNTER — Telehealth: Payer: Self-pay | Admitting: Internal Medicine

## 2008-06-27 ENCOUNTER — Encounter: Payer: Self-pay | Admitting: Internal Medicine

## 2008-07-02 ENCOUNTER — Ambulatory Visit (HOSPITAL_COMMUNITY): Payer: Self-pay | Admitting: Licensed Clinical Social Worker

## 2008-07-04 ENCOUNTER — Ambulatory Visit: Payer: Self-pay | Admitting: Endocrinology

## 2008-07-04 ENCOUNTER — Encounter: Payer: Self-pay | Admitting: Internal Medicine

## 2008-07-07 ENCOUNTER — Encounter: Payer: Self-pay | Admitting: Internal Medicine

## 2008-07-08 ENCOUNTER — Emergency Department (HOSPITAL_COMMUNITY): Admission: EM | Admit: 2008-07-08 | Discharge: 2008-07-08 | Payer: Self-pay | Admitting: Family Medicine

## 2008-07-14 ENCOUNTER — Encounter: Payer: Self-pay | Admitting: Internal Medicine

## 2008-07-16 ENCOUNTER — Encounter: Admission: RE | Admit: 2008-07-16 | Discharge: 2008-07-28 | Payer: Self-pay | Admitting: Internal Medicine

## 2008-07-17 ENCOUNTER — Ambulatory Visit: Payer: Self-pay | Admitting: Endocrinology

## 2008-07-21 ENCOUNTER — Ambulatory Visit: Payer: Self-pay | Admitting: Internal Medicine

## 2008-07-21 DIAGNOSIS — B181 Chronic viral hepatitis B without delta-agent: Secondary | ICD-10-CM | POA: Insufficient documentation

## 2008-07-21 LAB — CONVERTED CEMR LAB
HDL goal, serum: 40 mg/dL
LDL Goal: 100 mg/dL

## 2008-07-23 ENCOUNTER — Ambulatory Visit (HOSPITAL_COMMUNITY): Payer: Self-pay | Admitting: Licensed Clinical Social Worker

## 2008-07-28 ENCOUNTER — Ambulatory Visit (HOSPITAL_COMMUNITY): Payer: Self-pay | Admitting: Licensed Clinical Social Worker

## 2008-07-29 ENCOUNTER — Ambulatory Visit: Payer: Self-pay | Admitting: Endocrinology

## 2008-07-30 ENCOUNTER — Encounter: Payer: Self-pay | Admitting: Internal Medicine

## 2008-07-31 ENCOUNTER — Encounter: Admission: RE | Admit: 2008-07-31 | Discharge: 2008-07-31 | Payer: Self-pay | Admitting: Internal Medicine

## 2008-08-01 ENCOUNTER — Ambulatory Visit (HOSPITAL_COMMUNITY): Payer: Self-pay | Admitting: Psychiatry

## 2008-08-04 ENCOUNTER — Ambulatory Visit (HOSPITAL_COMMUNITY): Payer: Self-pay | Admitting: Licensed Clinical Social Worker

## 2008-08-05 ENCOUNTER — Telehealth: Payer: Self-pay | Admitting: Internal Medicine

## 2008-08-18 ENCOUNTER — Ambulatory Visit (HOSPITAL_COMMUNITY): Payer: Self-pay | Admitting: Licensed Clinical Social Worker

## 2008-08-19 ENCOUNTER — Ambulatory Visit: Payer: Self-pay | Admitting: Internal Medicine

## 2008-09-01 ENCOUNTER — Inpatient Hospital Stay (HOSPITAL_COMMUNITY): Admission: EM | Admit: 2008-09-01 | Discharge: 2008-09-03 | Payer: Self-pay | Admitting: Emergency Medicine

## 2008-09-01 ENCOUNTER — Ambulatory Visit (HOSPITAL_COMMUNITY): Payer: Self-pay | Admitting: Licensed Clinical Social Worker

## 2008-09-03 HISTORY — PX: CARDIOVASCULAR STRESS TEST: SHX262

## 2008-09-06 ENCOUNTER — Encounter: Payer: Self-pay | Admitting: Internal Medicine

## 2008-09-08 ENCOUNTER — Ambulatory Visit (HOSPITAL_COMMUNITY): Payer: Self-pay | Admitting: Licensed Clinical Social Worker

## 2008-09-09 ENCOUNTER — Ambulatory Visit (HOSPITAL_COMMUNITY): Payer: Self-pay | Admitting: Psychiatry

## 2008-09-10 ENCOUNTER — Telehealth: Payer: Self-pay | Admitting: Internal Medicine

## 2008-09-16 ENCOUNTER — Encounter: Payer: Self-pay | Admitting: Cardiovascular Disease

## 2008-09-22 ENCOUNTER — Ambulatory Visit (HOSPITAL_COMMUNITY): Payer: Self-pay | Admitting: Licensed Clinical Social Worker

## 2008-09-29 ENCOUNTER — Ambulatory Visit (HOSPITAL_COMMUNITY): Payer: Self-pay | Admitting: Licensed Clinical Social Worker
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
## 2008-10-06 ENCOUNTER — Telehealth (INDEPENDENT_AMBULATORY_CARE_PROVIDER_SITE_OTHER): Payer: Self-pay | Admitting: *Deleted

## 2008-10-08 ENCOUNTER — Ambulatory Visit: Payer: Self-pay | Admitting: Internal Medicine

## 2008-10-08 LAB — CONVERTED CEMR LAB
ALT: 34 units/L (ref 0–53)
Basophils Absolute: 0 10*3/uL (ref 0.0–0.1)
Bilirubin Urine: NEGATIVE
Bilirubin, Direct: 0.1 mg/dL (ref 0.0–0.3)
Calcium: 9.7 mg/dL (ref 8.4–10.5)
Creatinine, Ser: 0.8 mg/dL (ref 0.4–1.5)
Creatinine,U: 79.4 mg/dL
Crystals: NEGATIVE
GFR calc Af Amer: 133 mL/min
GFR calc non Af Amer: 110 mL/min
HCT: 47.2 % (ref 39.0–52.0)
Hemoglobin, Urine: NEGATIVE
Leukocytes, UA: NEGATIVE
MCHC: 35.1 g/dL (ref 30.0–36.0)
Microalb Creat Ratio: 2.5 mg/g (ref 0.0–30.0)
Microalb, Ur: 0.2 mg/dL (ref 0.0–1.9)
Monocytes Absolute: 1 10*3/uL (ref 0.1–1.0)
Monocytes Relative: 10.5 % (ref 3.0–12.0)
Nitrite: NEGATIVE
Platelets: 140 10*3/uL — ABNORMAL LOW (ref 150–400)
RDW: 15.7 % — ABNORMAL HIGH (ref 11.5–14.6)
Sodium: 142 meq/L (ref 135–145)
TSH: 1.27 microintl units/mL (ref 0.35–5.50)
Total Bilirubin: 0.6 mg/dL (ref 0.3–1.2)
Total Protein, Urine: NEGATIVE mg/dL
Urobilinogen, UA: 0.2 (ref 0.0–1.0)
WBC, UA: NONE SEEN cells/hpf

## 2008-10-11 ENCOUNTER — Emergency Department (HOSPITAL_COMMUNITY): Admission: EM | Admit: 2008-10-11 | Discharge: 2008-10-11 | Payer: Self-pay | Admitting: Emergency Medicine

## 2008-10-18 IMAGING — CT CT CHEST W/O CM
3 of 4 series · 17 of 31 positions shown, 19 images · IV contrast (CONTRAST)
Comparison: NONE

CLINICAL DATA: Right upper lobe mass. 

CT CHEST FOLLOWING INTRAVENOUS CONTRAST
TECHNIQUE: Multiple axial slices were obtained from the lung 
apex through the upper abdomen.  75 cc of Optiray 350 was injected 
at a rate of 3 cc per second.  Lung, soft tissue, and bone window 
settings were obtained.

[Series 2: with · axial · 0.75mm/px · z∈[+685,+950]mm · 7 of 75 slices shown]
[im 11/75  lung]
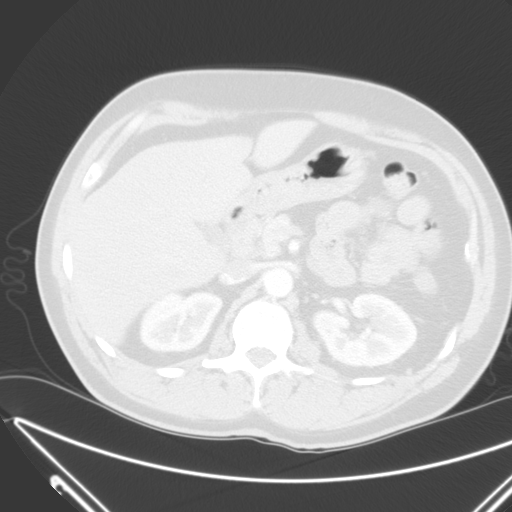
[im 22/75  lung]
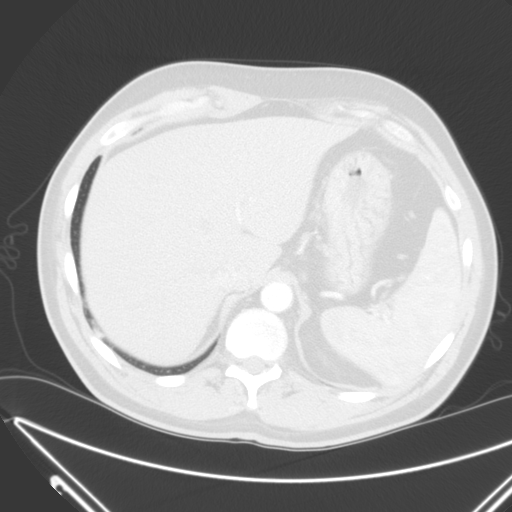
[im 32/75  lung]
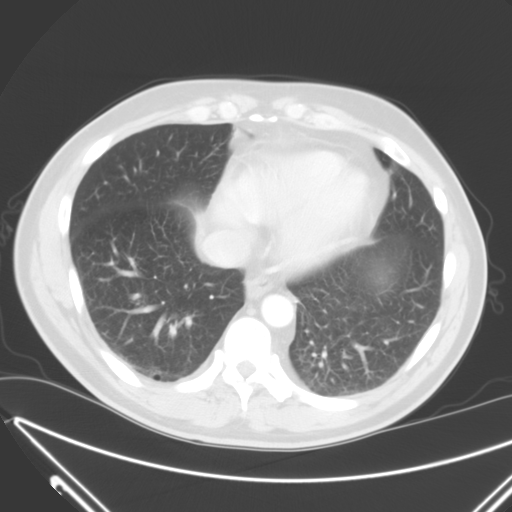
[im 36/75  lung]
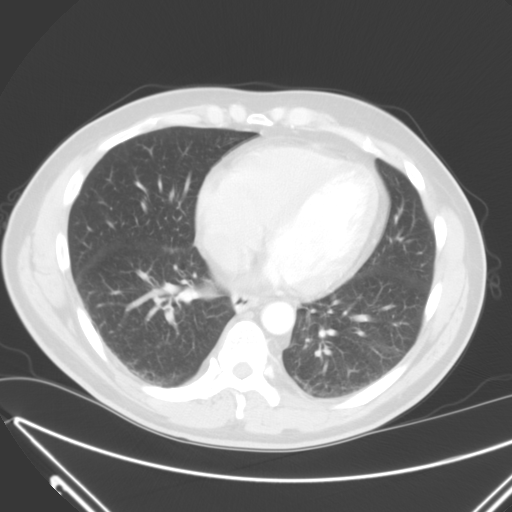
[im 43/75  lung]
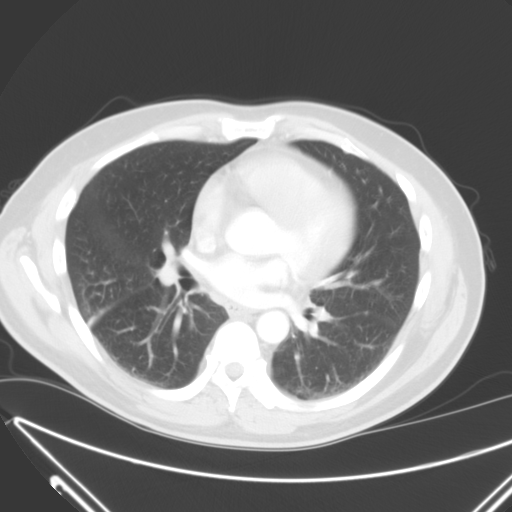
[im 53/75  lung]
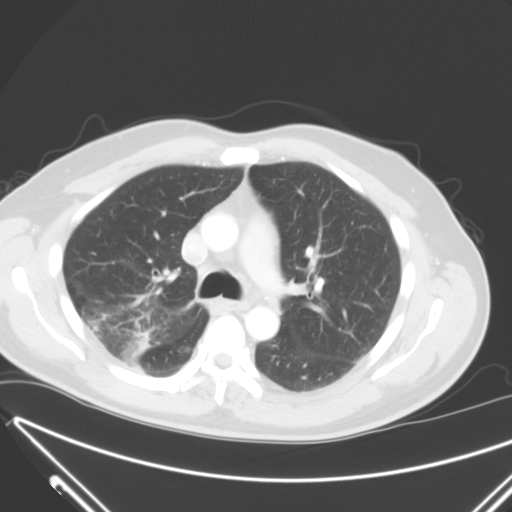
[im 64/75  lung]
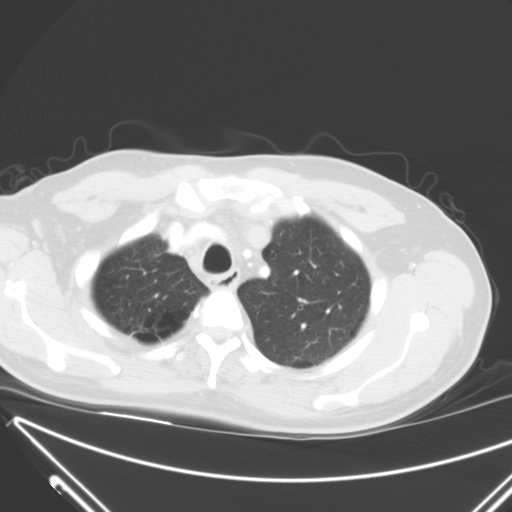

[Series 4: liver · axial · 0.75mm/px · z∈[+685,+735]mm · 2 of 31 slices shown]
[im 11/31  lung]
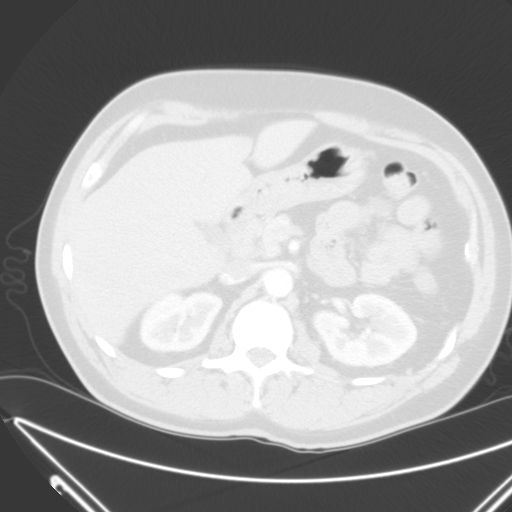
[im 21/31  lung]
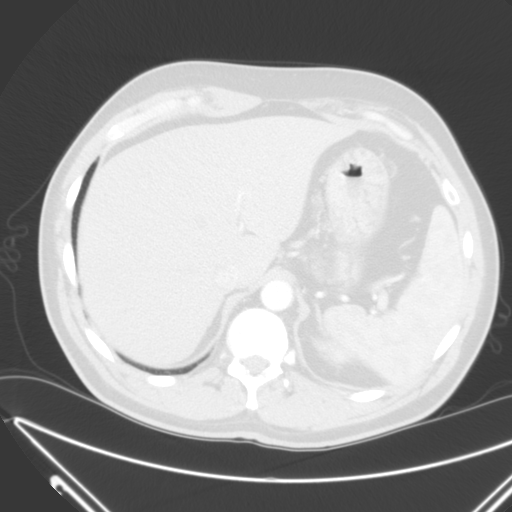

[Series 5: bone · axial · 0.75mm/px · z∈[+680,+955]mm · 8 of 75 slices shown, 10 images]
[im 10/75  mediastinal]
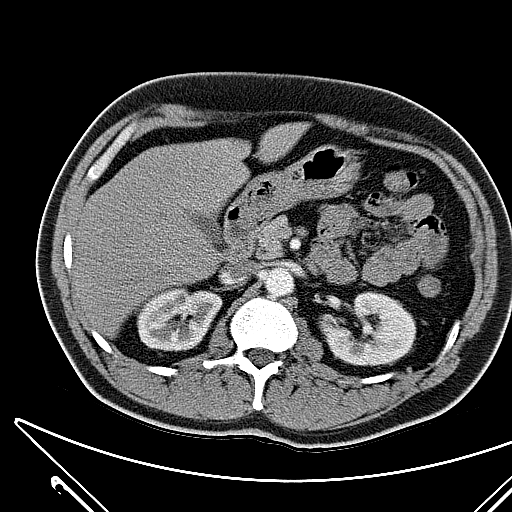
[im 10/75  lung]
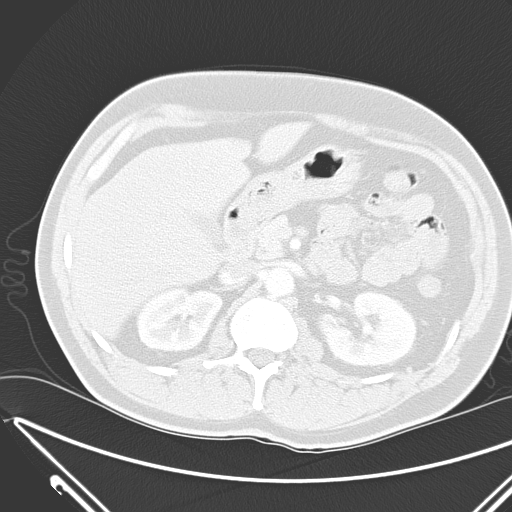
[im 19/75  lung]
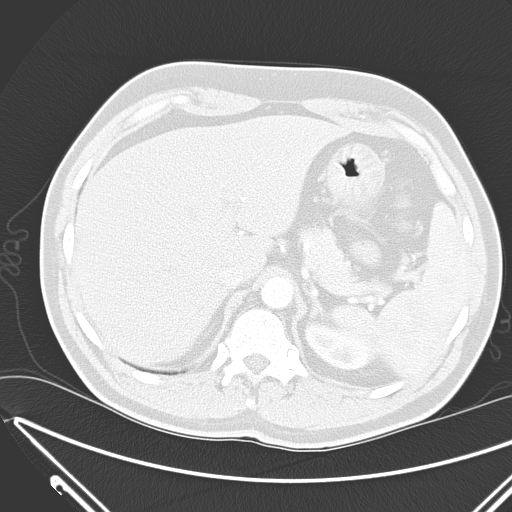
[im 28/75  lung]
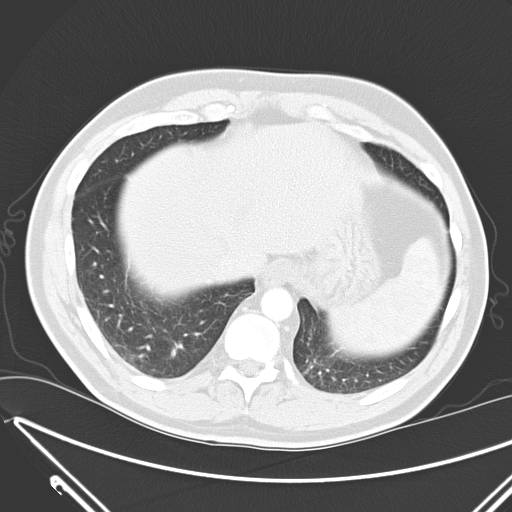
[im 36/75  lung]
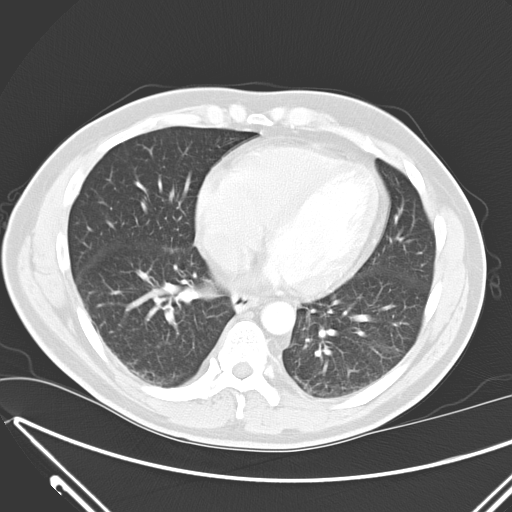
[im 38/75  mediastinal]
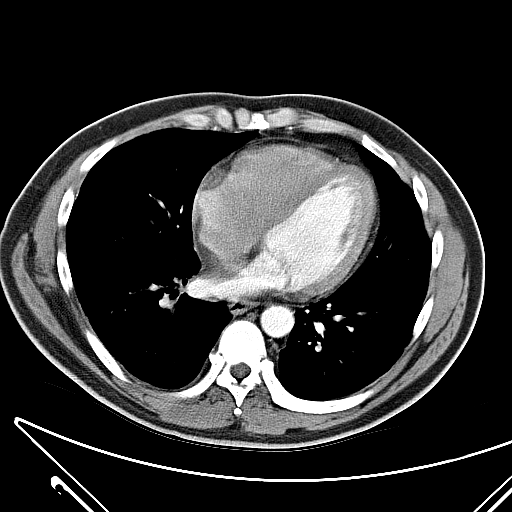
[im 38/75  lung]
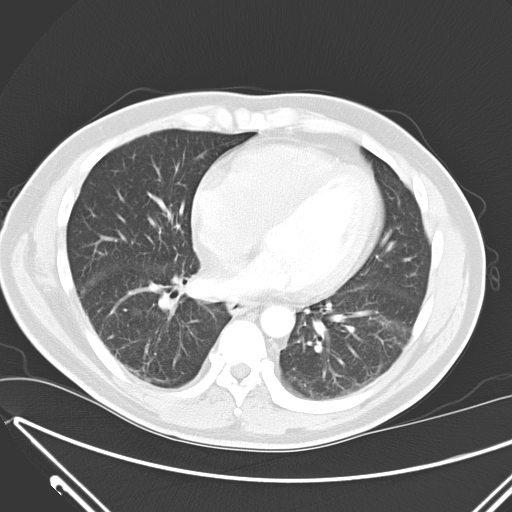
[im 47/75  lung]
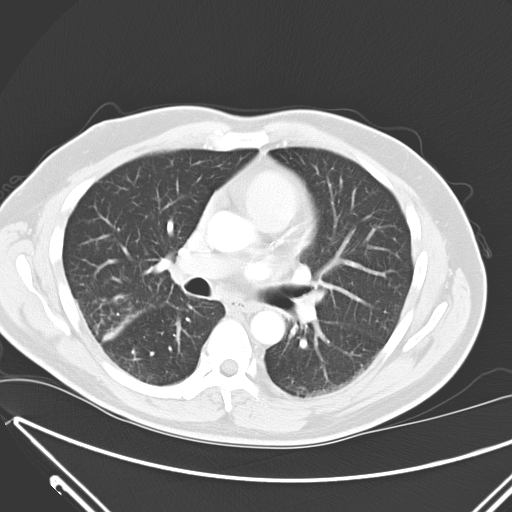
[im 56/75  lung]
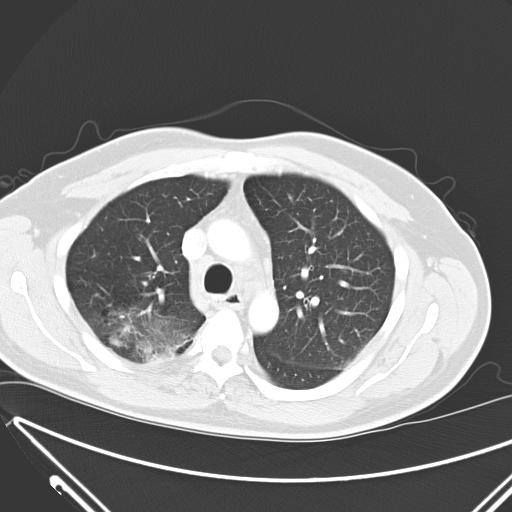
[im 65/75  lung]
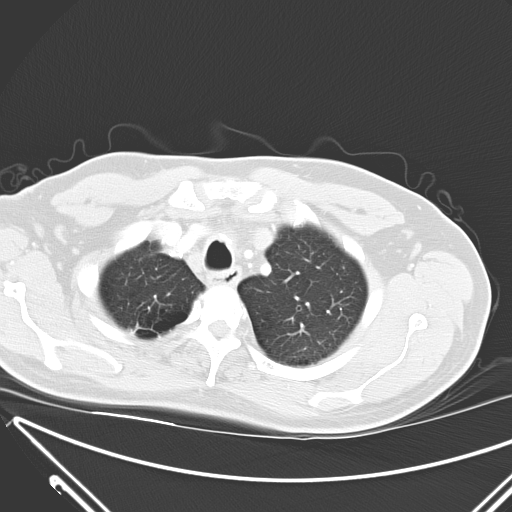

[17 of 31 positions shown; findings below may reference images not displayed]

FINDINGS: Emphysematous changes with parenchymal scarring are 
present in the upper lobes.  There is an area of parenchymal 
opacity with scarring in the inferior portion of the right upper 
lobe abutting the minor fissure.  No evidence of consolidation or 
homogeneous mass.  Lung fields are otherwise clear.  No pleural 
effusion. No axillary, mediastinal, or hilar mass or adenopathy.  
The visualized portions of the upper abdominal structures appear 
unremarkable.  No lytic or blastic lesions.
IMPRESSION: Area of chronic parenchymal scarring versus resolving 
pneumonia or possibly developing neoplasm.  No other findings in 
the chest.  Recommend limited noncontrast follow-up CT in [DATE] 
12/03/2007 Dict Date: 12/03/2007  Tran Date:  12/03/2007 DAS  JLM

## 2008-10-21 ENCOUNTER — Telehealth (INDEPENDENT_AMBULATORY_CARE_PROVIDER_SITE_OTHER): Payer: Self-pay | Admitting: *Deleted

## 2008-10-24 ENCOUNTER — Encounter: Payer: Self-pay | Admitting: Cardiovascular Disease

## 2008-10-24 ENCOUNTER — Ambulatory Visit (HOSPITAL_COMMUNITY): Admission: RE | Admit: 2008-10-24 | Discharge: 2008-10-24 | Payer: Self-pay | Admitting: Cardiovascular Disease

## 2008-10-24 HISTORY — PX: TRANSESOPHAGEAL ECHOCARDIOGRAM: SHX273

## 2008-10-30 ENCOUNTER — Ambulatory Visit: Payer: Self-pay | Admitting: Internal Medicine

## 2008-10-30 DIAGNOSIS — D696 Thrombocytopenia, unspecified: Secondary | ICD-10-CM | POA: Insufficient documentation

## 2008-10-30 LAB — CONVERTED CEMR LAB
Basophils Absolute: 0 10*3/uL (ref 0.0–0.1)
Basophils Relative: 0.3 % (ref 0.0–3.0)
Eosinophils Absolute: 0.1 10*3/uL (ref 0.0–0.7)
Lymphocytes Relative: 31.9 % (ref 12.0–46.0)
MCHC: 35.3 g/dL (ref 30.0–36.0)
MCV: 89.3 fL (ref 78.0–100.0)
Neutro Abs: 6 10*3/uL (ref 1.4–7.7)
Neutrophils Relative %: 58.1 % (ref 43.0–77.0)
Prothrombin Time: 11.4 s (ref 10.9–13.3)
RBC: 4.9 M/uL (ref 4.22–5.81)
RDW: 14.6 % (ref 11.5–14.6)
aPTT: 32.1 s — ABNORMAL HIGH (ref 21.7–29.8)

## 2008-11-03 ENCOUNTER — Encounter: Payer: Self-pay | Admitting: Internal Medicine

## 2008-11-04 ENCOUNTER — Ambulatory Visit (HOSPITAL_COMMUNITY): Payer: Self-pay | Admitting: Psychiatry

## 2008-11-06 ENCOUNTER — Ambulatory Visit (HOSPITAL_COMMUNITY): Payer: Self-pay | Admitting: Licensed Clinical Social Worker

## 2008-11-10 ENCOUNTER — Telehealth: Payer: Self-pay | Admitting: Internal Medicine

## 2008-11-10 IMAGING — CR DG CHEST 2V
2 series · 2 of 2 positions shown · non-contrast
Comparison: 02/17/2006

CLINICAL DATA: Shortness of breath and chest pain. Pre-cardiac catheterization.

CHEST - 2 VIEW

[w chest pa]
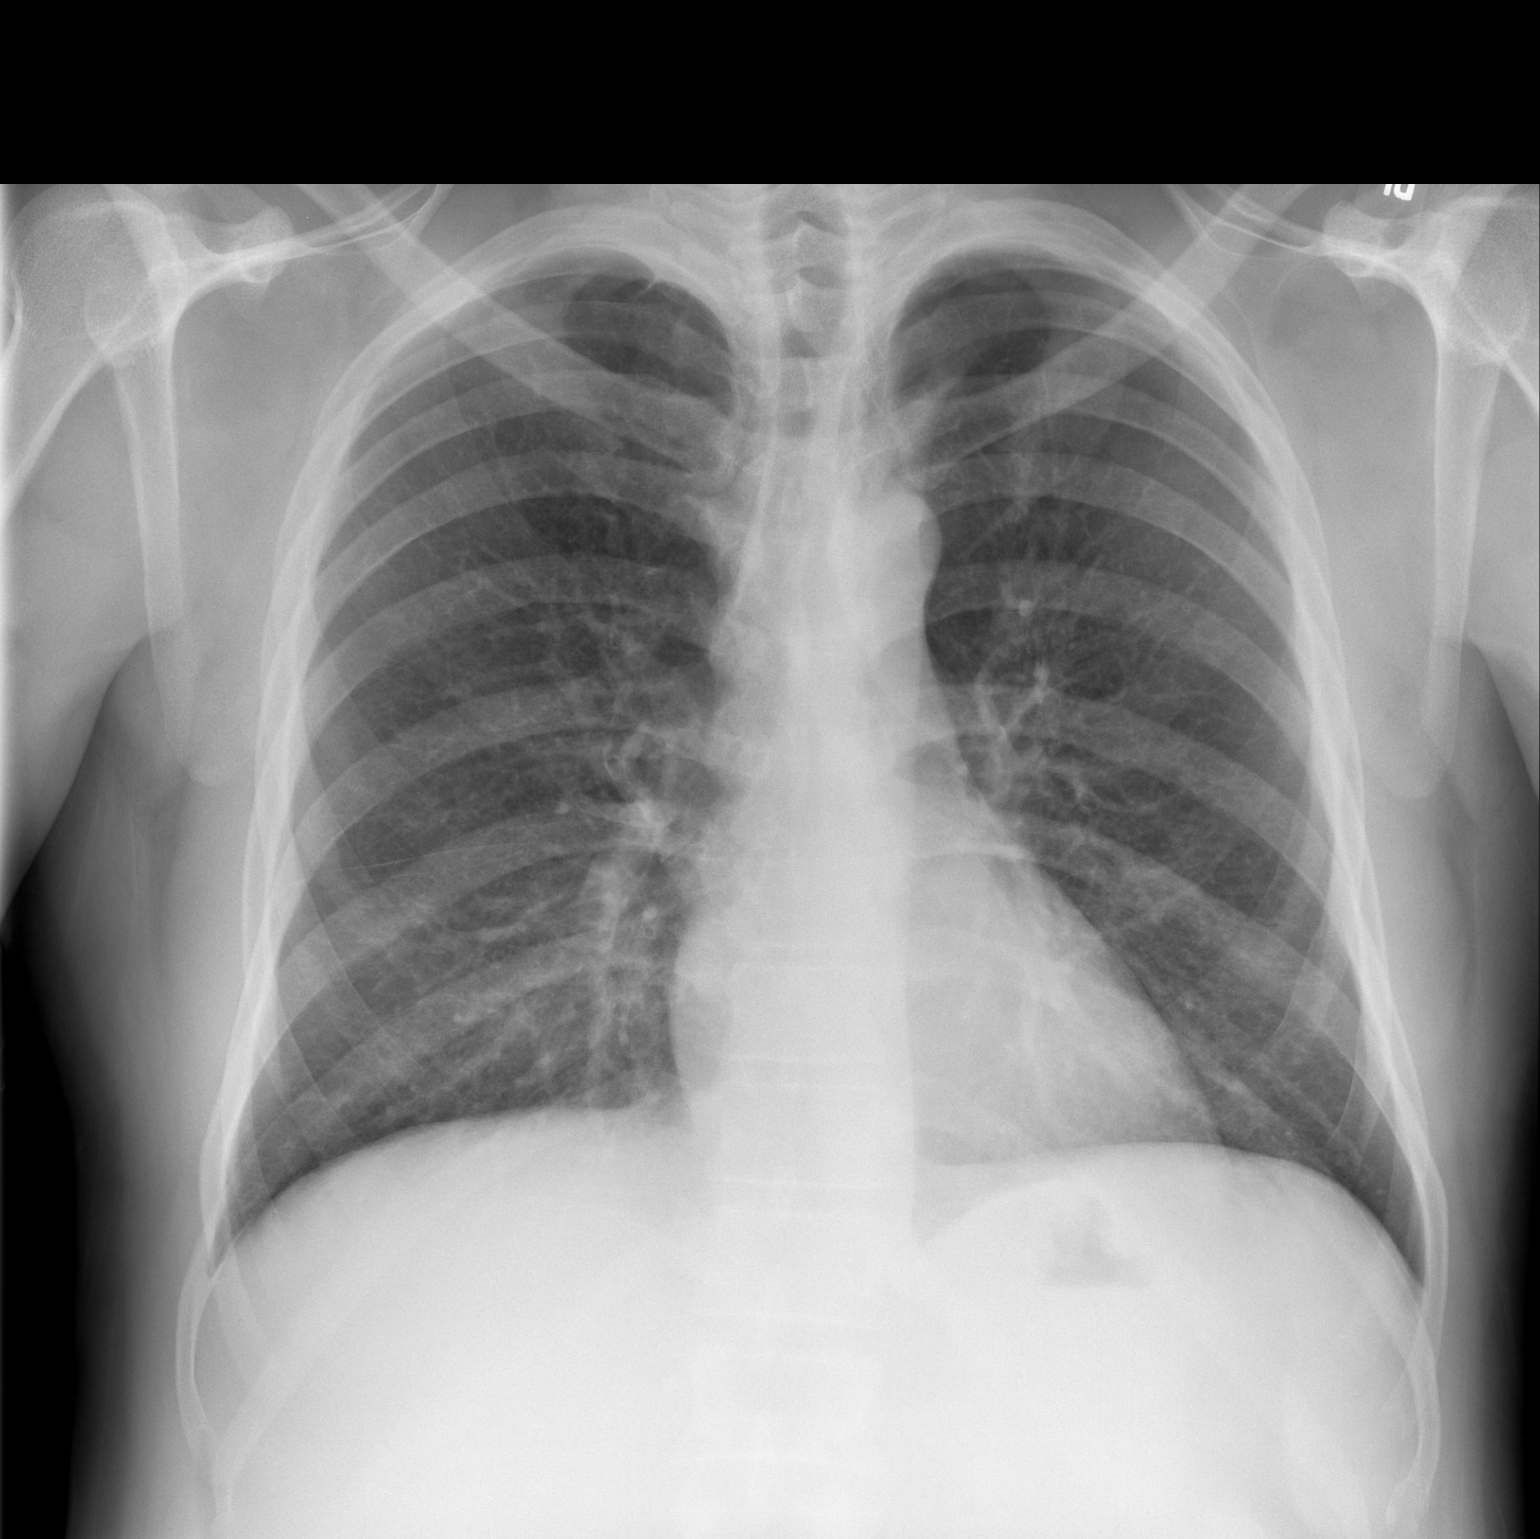

[w chest lat]
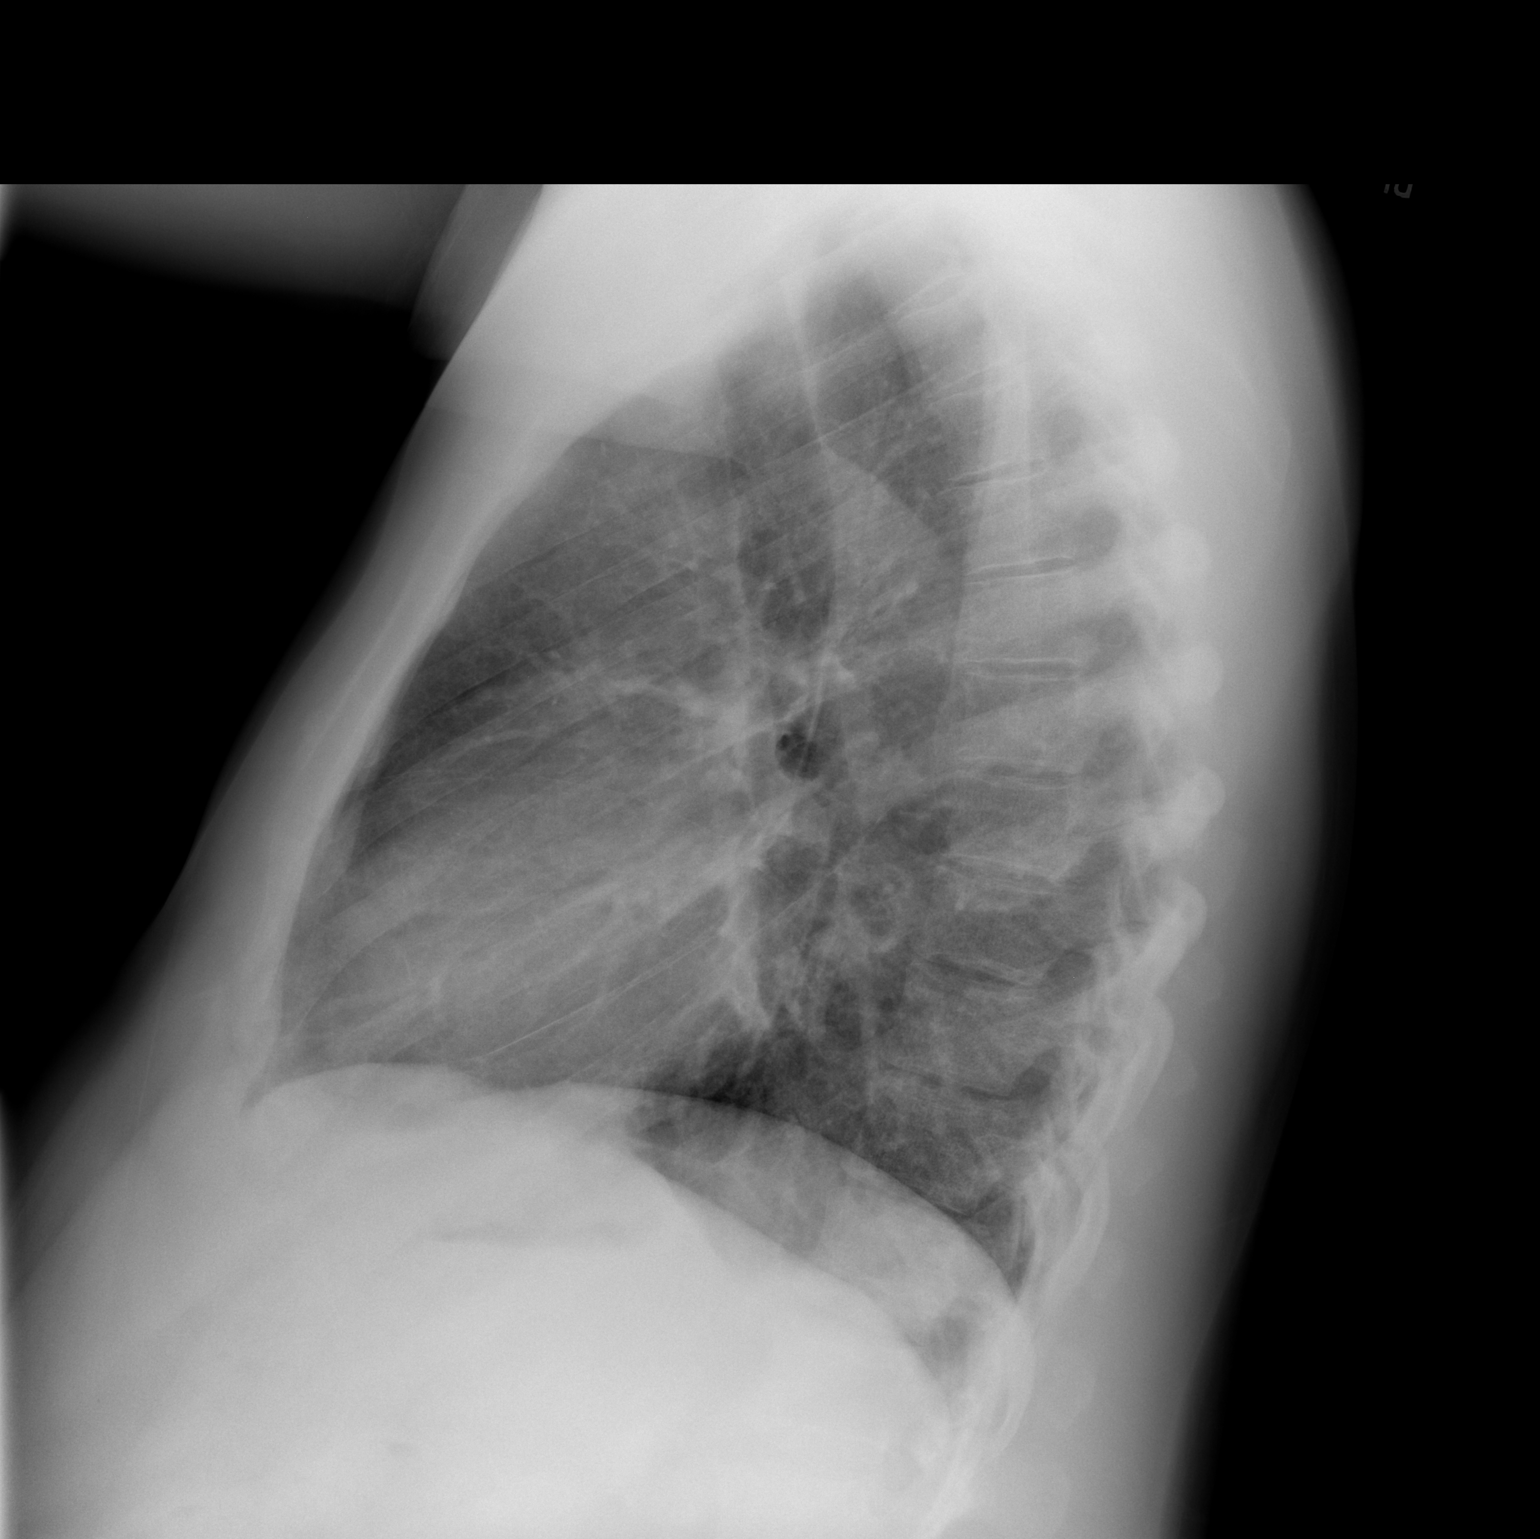

[2 of 2 positions shown; findings below may reference images not displayed]

FINDINGS: Cardiac and mediastinal contours appear normal. There is minimal
scarring at the right lung apex. The lungs appear otherwise clear. No pleural
effusion noted.

IMPRESSION

No significant thoracic abnormalities identified.

## 2008-11-17 ENCOUNTER — Telehealth (INDEPENDENT_AMBULATORY_CARE_PROVIDER_SITE_OTHER): Payer: Self-pay | Admitting: *Deleted

## 2008-11-18 ENCOUNTER — Ambulatory Visit (HOSPITAL_COMMUNITY): Payer: Self-pay | Admitting: Licensed Clinical Social Worker

## 2008-11-18 ENCOUNTER — Ambulatory Visit: Payer: Self-pay | Admitting: Endocrinology

## 2008-11-20 ENCOUNTER — Telehealth (INDEPENDENT_AMBULATORY_CARE_PROVIDER_SITE_OTHER): Payer: Self-pay | Admitting: *Deleted

## 2008-11-24 ENCOUNTER — Ambulatory Visit: Payer: Self-pay | Admitting: Internal Medicine

## 2008-11-25 ENCOUNTER — Ambulatory Visit (HOSPITAL_COMMUNITY): Payer: Self-pay | Admitting: Licensed Clinical Social Worker

## 2008-11-28 ENCOUNTER — Encounter: Payer: Self-pay | Admitting: Internal Medicine

## 2008-12-01 ENCOUNTER — Encounter: Payer: Self-pay | Admitting: Internal Medicine

## 2008-12-02 ENCOUNTER — Telehealth (INDEPENDENT_AMBULATORY_CARE_PROVIDER_SITE_OTHER): Payer: Self-pay | Admitting: *Deleted

## 2008-12-02 ENCOUNTER — Ambulatory Visit (HOSPITAL_COMMUNITY): Payer: Self-pay | Admitting: Licensed Clinical Social Worker

## 2008-12-03 ENCOUNTER — Ambulatory Visit: Payer: Self-pay | Admitting: Internal Medicine

## 2008-12-03 DIAGNOSIS — Z86010 Personal history of colonic polyps: Secondary | ICD-10-CM | POA: Insufficient documentation

## 2008-12-10 ENCOUNTER — Ambulatory Visit (HOSPITAL_COMMUNITY): Admission: RE | Admit: 2008-12-10 | Discharge: 2008-12-10 | Payer: Self-pay | Admitting: Internal Medicine

## 2008-12-13 IMAGING — CR DG CHEST 1V PORT
1 series · 1 of 1 positions shown · non-contrast
Comparison: Chest radiograph of 08/22/07.

CLINICAL DATA: Chest pain and vomiting.
PORTABLE CHEST ? 1 VIEW ? 09/24/07 ? [DATE]:

[view not recorded]
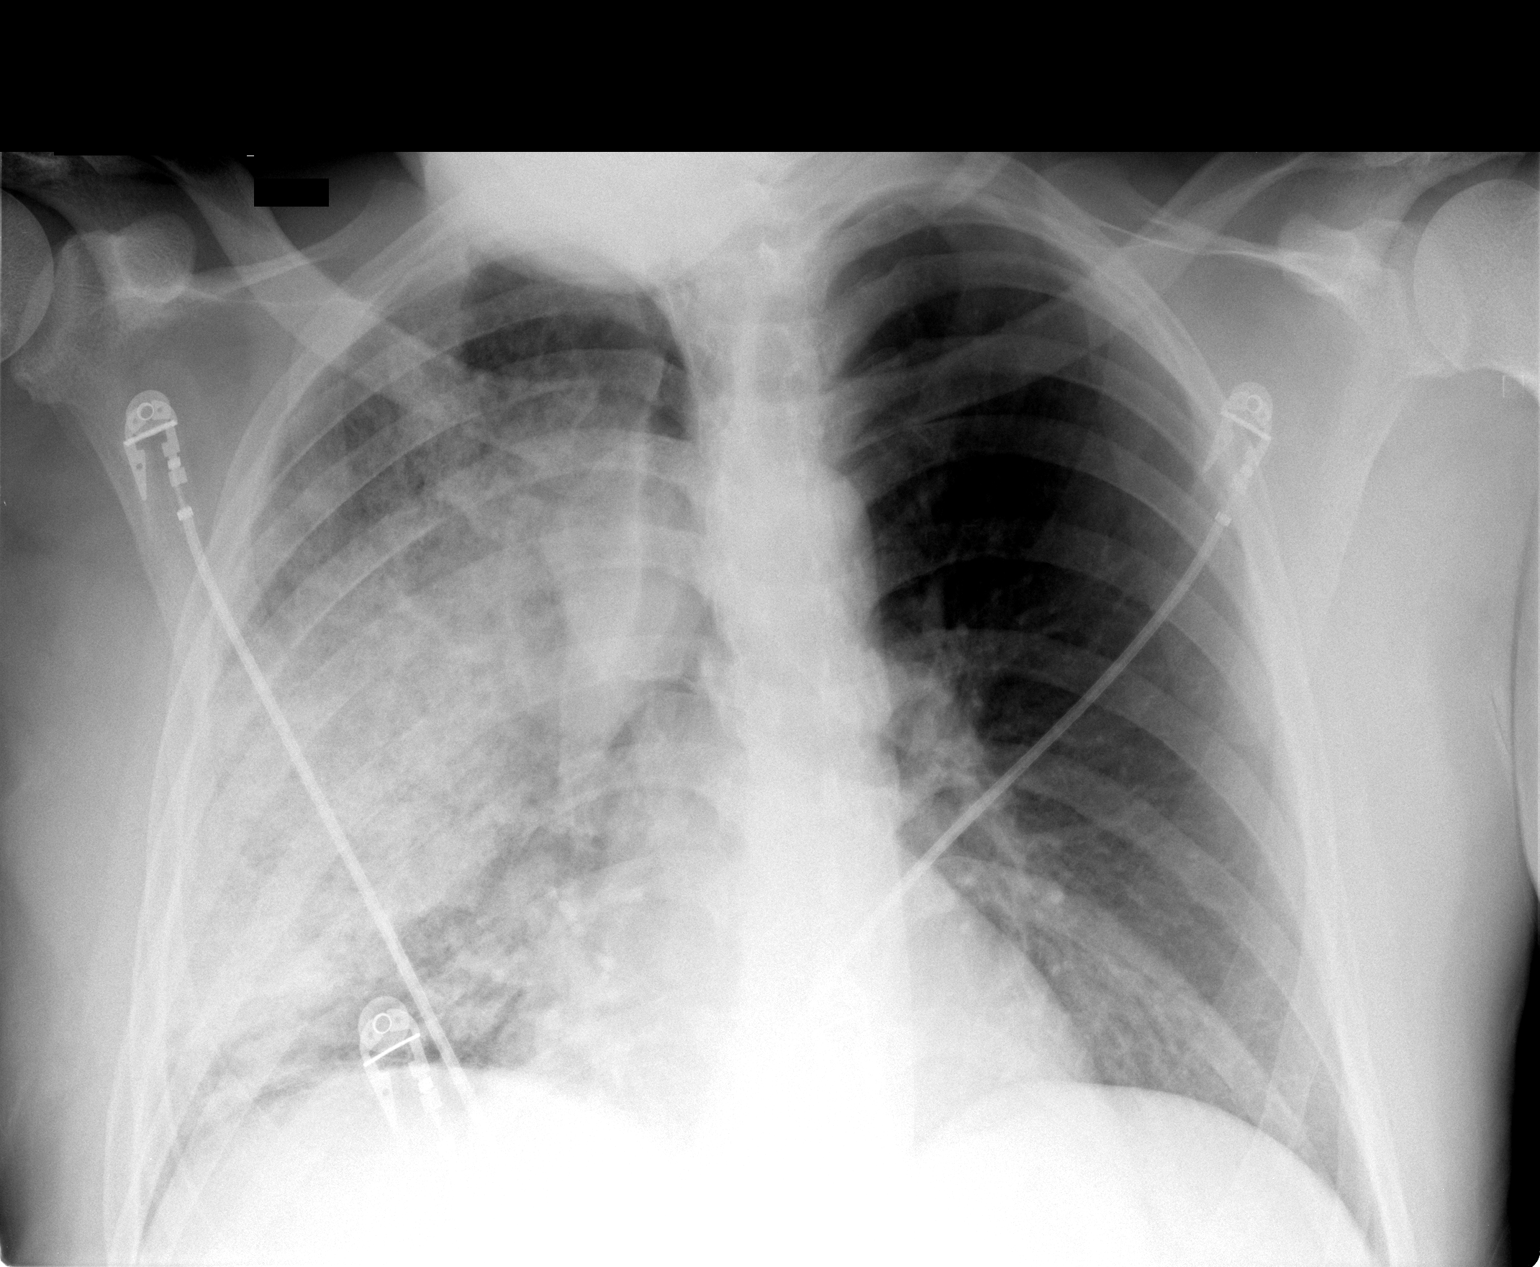

[1 of 1 positions shown; findings below may reference images not displayed]

FINDINGS: The patient is slightly rotated to the right.  There is extensive airspace opacity involving a large portion of the right upper mid and lower lung fields consistent with extensive pneumonia.  No discrete pleural effusion is identified.  
There is vague hazy opacity at the medial left lung base, which may represent a focus of atelectasis or early airspace disease.  The remainder of the left lung is clear.
IMPRESSION: Extensive right-sided airspace disease compatible with pneumonia.  
Findings were called to the [HOSPITAL] [DATE] on 09/24/07.

## 2008-12-14 IMAGING — CT CT ANGIO CHEST
2 of 5 series · 19 of 36 positions shown · IV contrast (APPLIED)
Comparison: none

CLINICAL DATA: Chest pain and shortness of breath.
 CT ANGIOGRAPHY OF CHEST WITH CONTRAST ? 09/25/07:
TECHNIQUE: Multidetector CT imaging of the chest was performed during bolus injection of intravenous contrast.  Multiplanar CT angiographic image reconstructions were generated to evaluate the vascular anatomy. 
 Contrast:  100 cc Omnipaque 300 IV.

[Series 7: pulm embolism 1.0 thins · axial · 0.72mm/px · z∈[+1144,+1394]mm · 16 of 280 slices shown]
[im 15/280  lung]
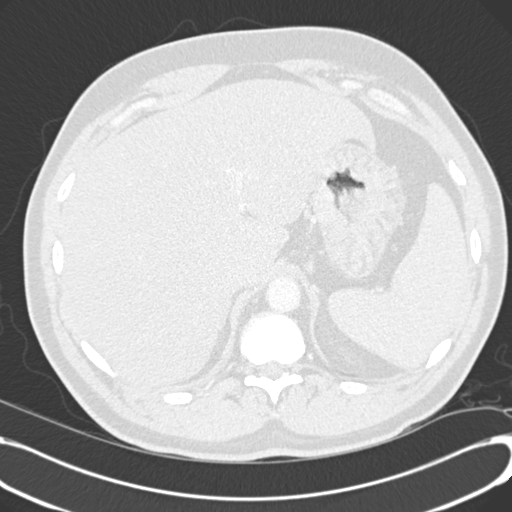
[im 30/280  mediastinal]
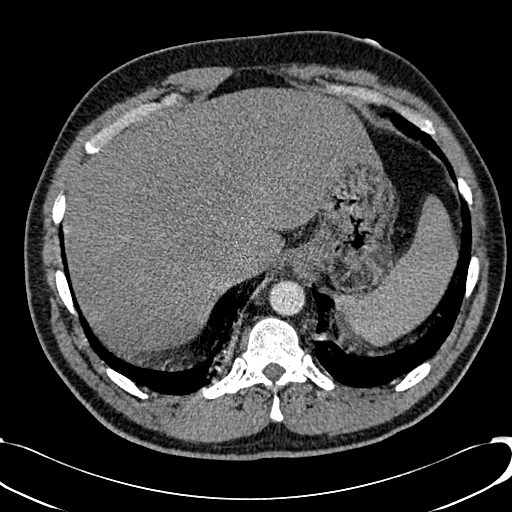
[im 45/280  lung]
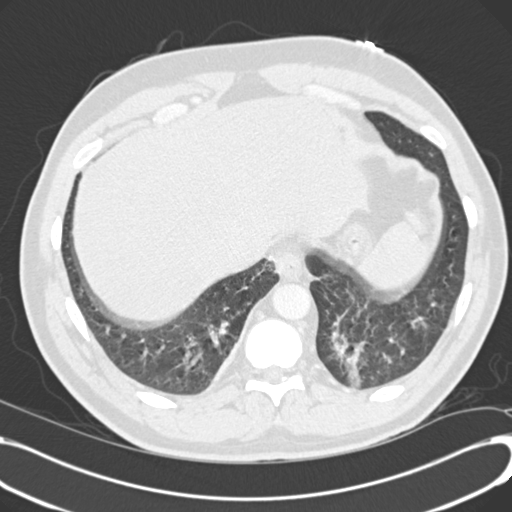
[im 59/280  mediastinal]
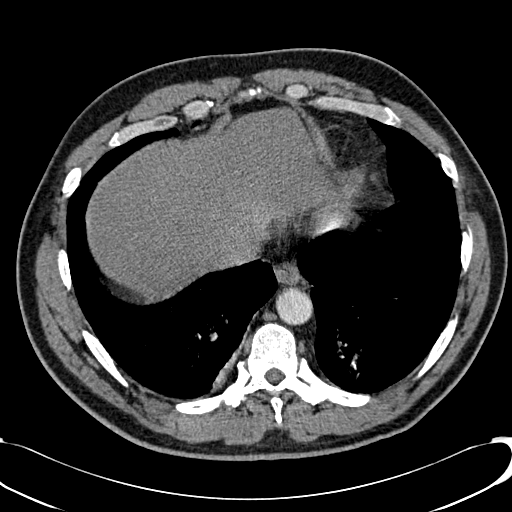
[im 89/280  lung]
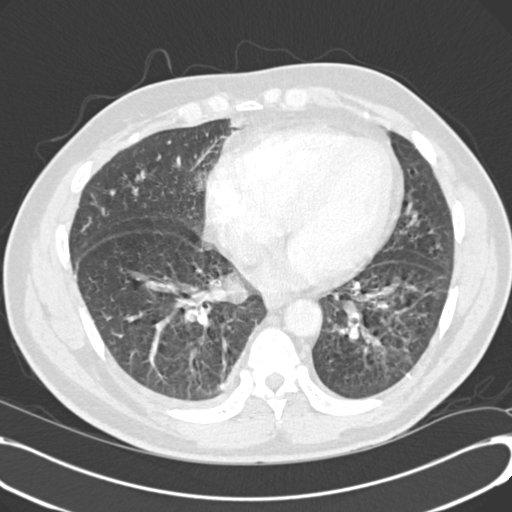
[im 103/280  mediastinal]
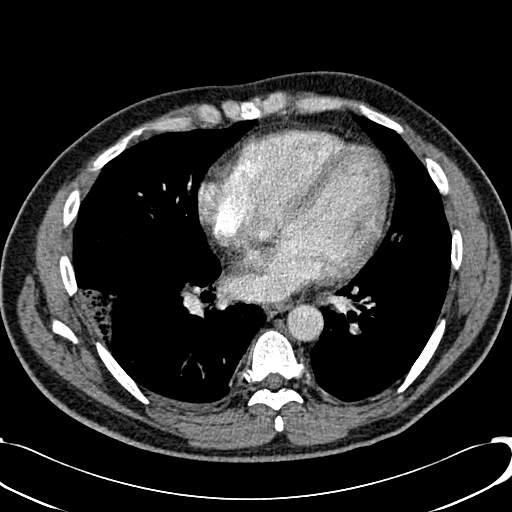
[im 118/280  lung]
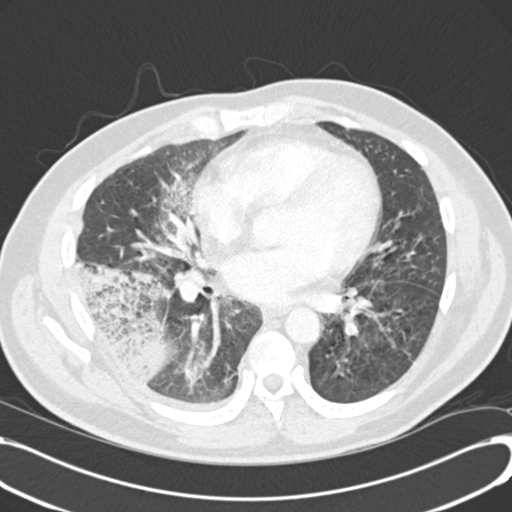
[im 133/280  mediastinal]
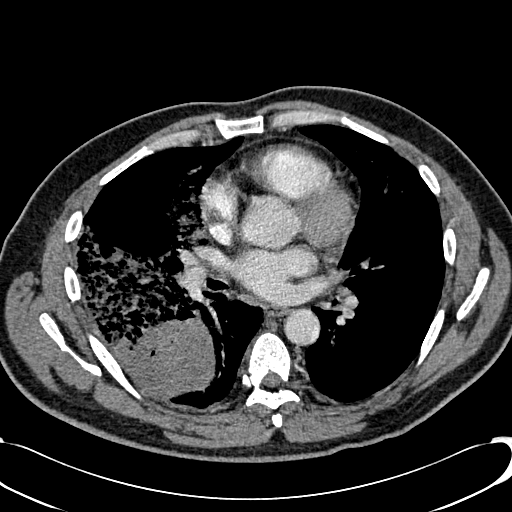
[im 147/280  lung]
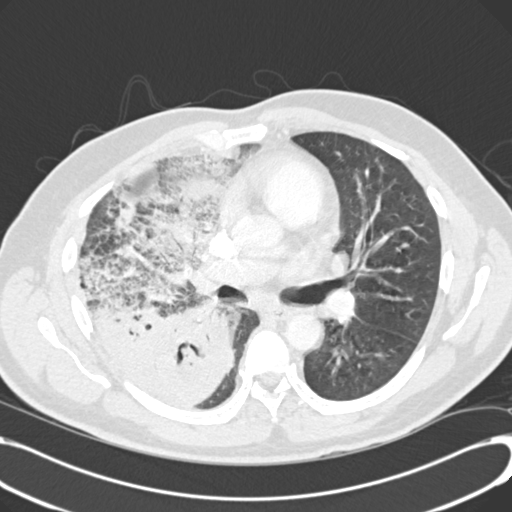
[im 162/280  mediastinal]
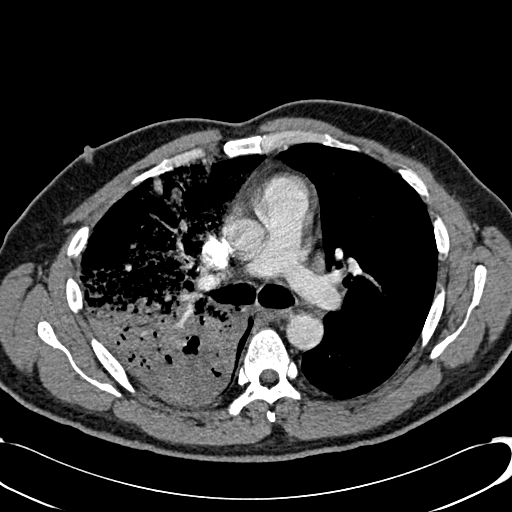
[im 177/280  lung]
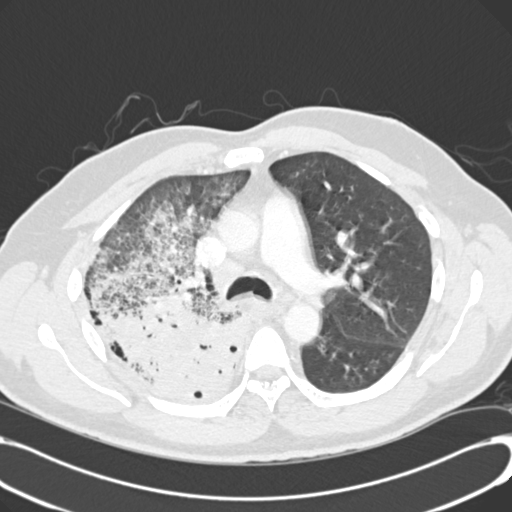
[im 191/280  mediastinal]
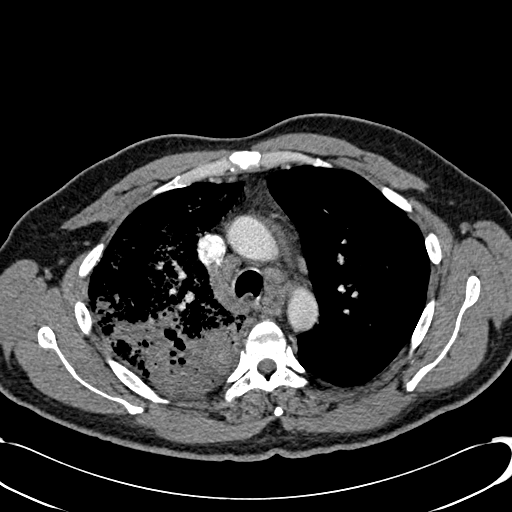
[im 221/280  lung]
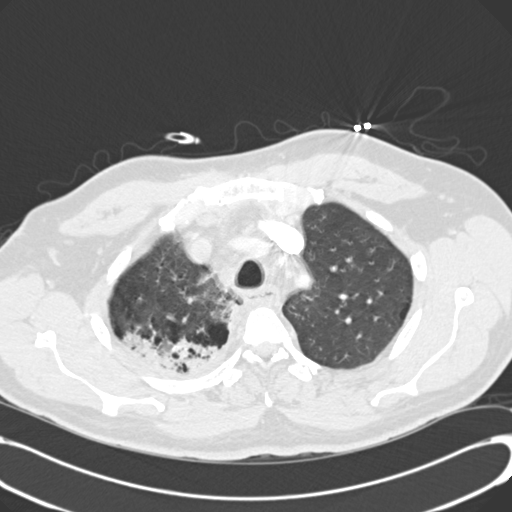
[im 235/280  mediastinal]
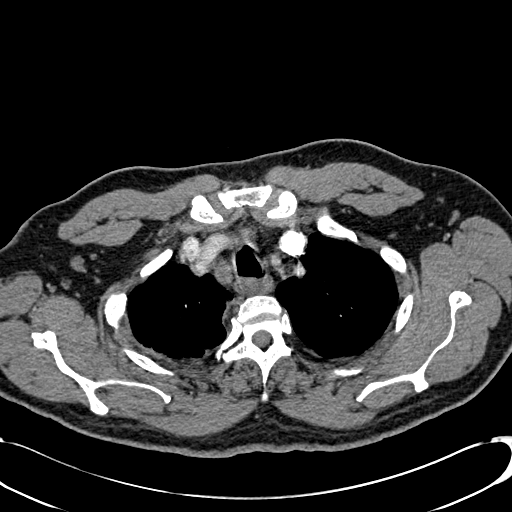
[im 250/280  lung]
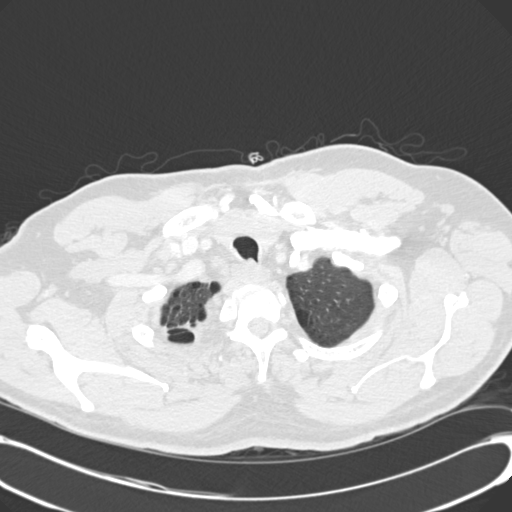
[im 265/280  mediastinal]
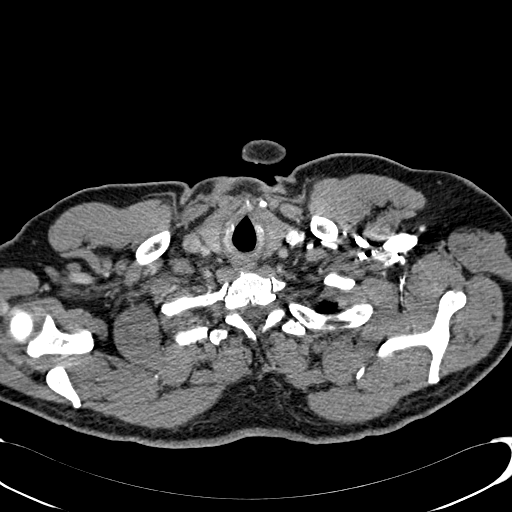

[Series 9: pulm embolism 2.0 cor · coronal · 0.71mm/px · 3 of 124 slices shown]
[im 25/124  mediastinal]
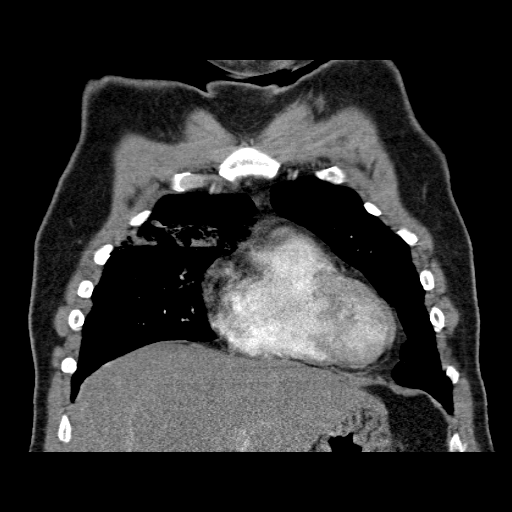
[im 50/124  mediastinal]
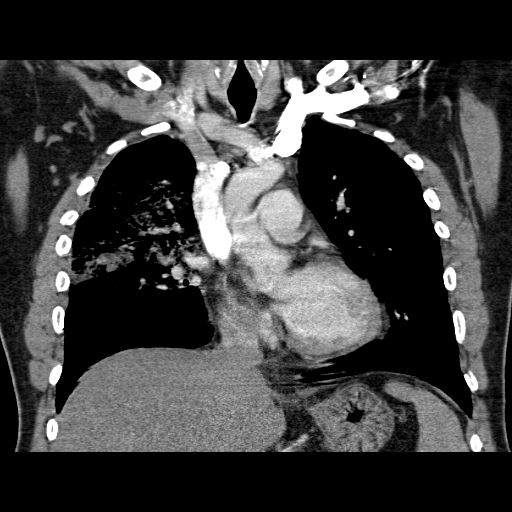
[im 74/124  mediastinal]
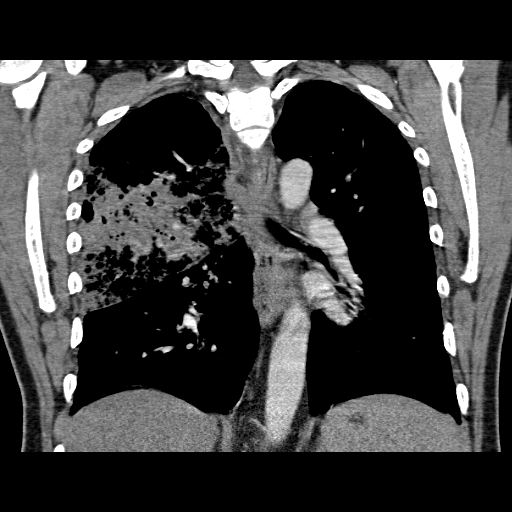

[19 of 36 positions shown; findings below may reference images not displayed]

FINDINGS: The scan demonstrates an extensive necrotizing pneumonia of the right upper lobe with inferior bulging of the major fissure because of the consolidated necrotic lung.  There are small patchy areas of infiltrate in the left lower lobe and in the medial aspect of the right middle lobe.  No effusions.  There is right hilar and mediastinal adenopathy which is probably reactive secondary to the pneumonia.  No pulmonary emboli.
 The patient?s chest x-ray was clear on 08/22/07.
IMPRESSION: Necrotizing right upper lobe pneumonia. The findings were called to the [REDACTED] on call for this patient by Dr. Dzib.

## 2008-12-15 IMAGING — CR DG CHEST 1V PORT
1 series · 1 of 1 positions shown · non-contrast
Comparison: 09/24/2007

CLINICAL DATA: Intubation, central line placement

PORTABLE CHEST - 1 VIEW:

[view not recorded]
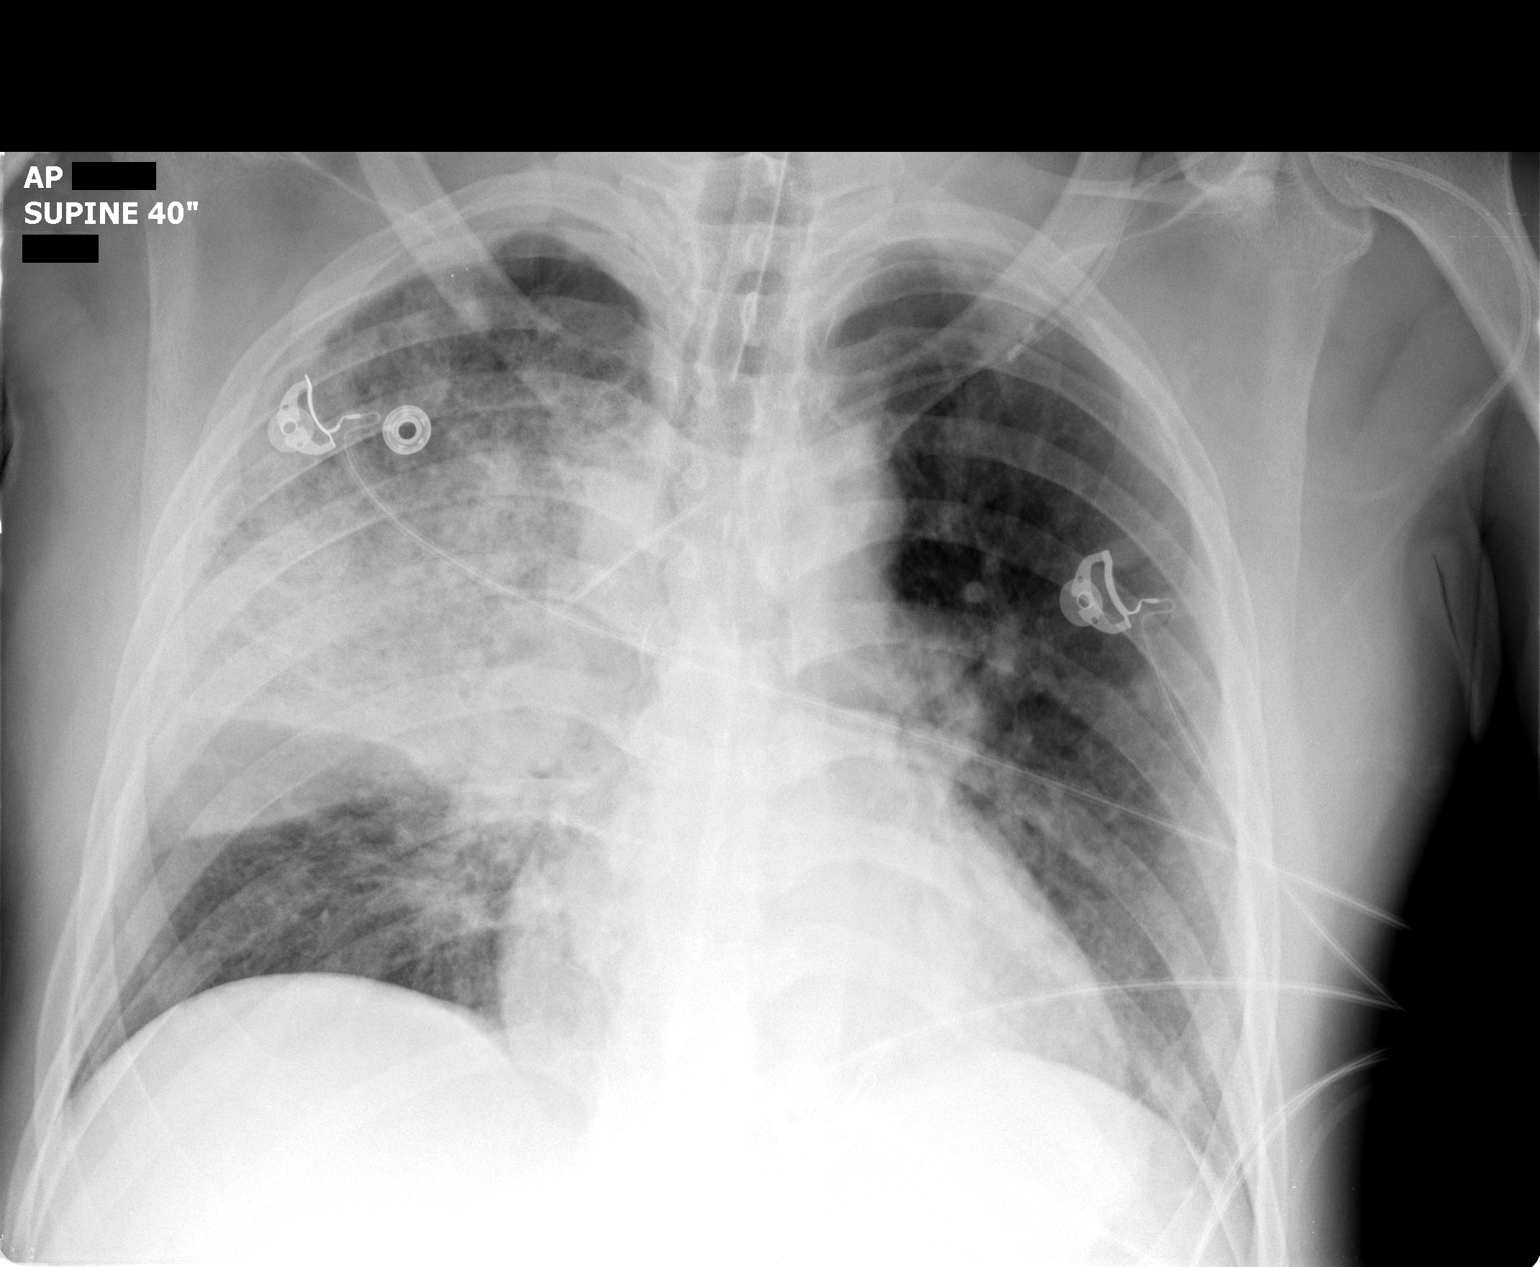

[1 of 1 positions shown; findings below may reference images not displayed]

FINDINGS: Endotracheal tube is 6 cm above the carina. Left subclavian central
line is in place with the tip in the upper SVC. No pneumothorax.

Dense consolidation in the right upper lobe, slightly improved. No effusions.
IMPRESSION: Left central line tip upper SVC, no pneumothorax. Endotracheal tube tip 6 cm
above carina.

Dense right upper lobe consolidation, slightly improved.

## 2008-12-15 IMAGING — CR DG ABD PORTABLE 1V
1 series · 1 of 1 positions shown · non-contrast
Comparison: None.

CLINICAL DATA: Panda placement.
 PORTABLE ABDOMEN ? 1 VIEW:

[view not recorded]
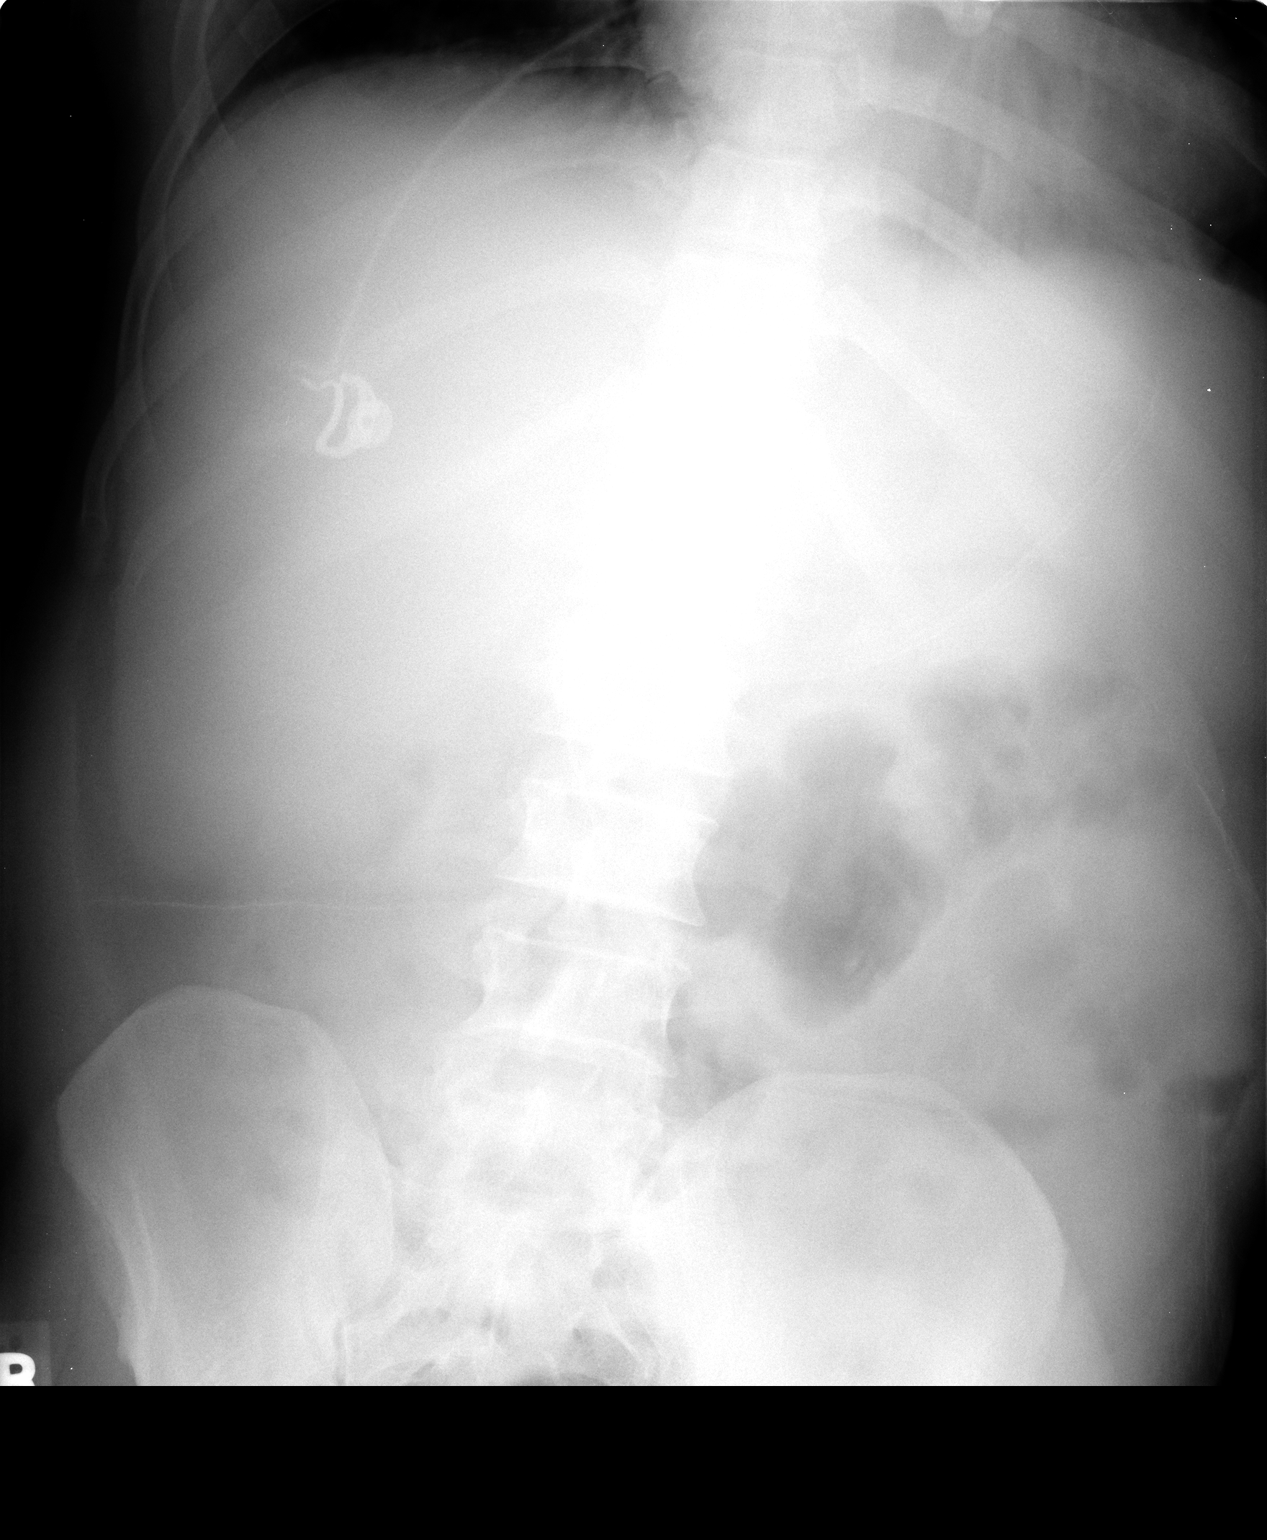

[1 of 1 positions shown; findings below may reference images not displayed]

FINDINGS: There is no visible Panda tube.  There is a possible nasogastric tube with its tip in the distal C-loop.  This also could be some type of tubing overlying the patient. This needs clinical correlation.
IMPRESSION: Possible small bore NG tube with its tip in the distal C-loop, but no visible Panda tube.

## 2008-12-16 IMAGING — CR DG CHEST 1V PORT
1 series · 1 of 1 positions shown · non-contrast
Comparison: 09/26/07.

CLINICAL DATA: Necrotizing pneumonia and respiratory failure.
 PORTABLE CHEST - 1 VIEW (9199):

[AP]
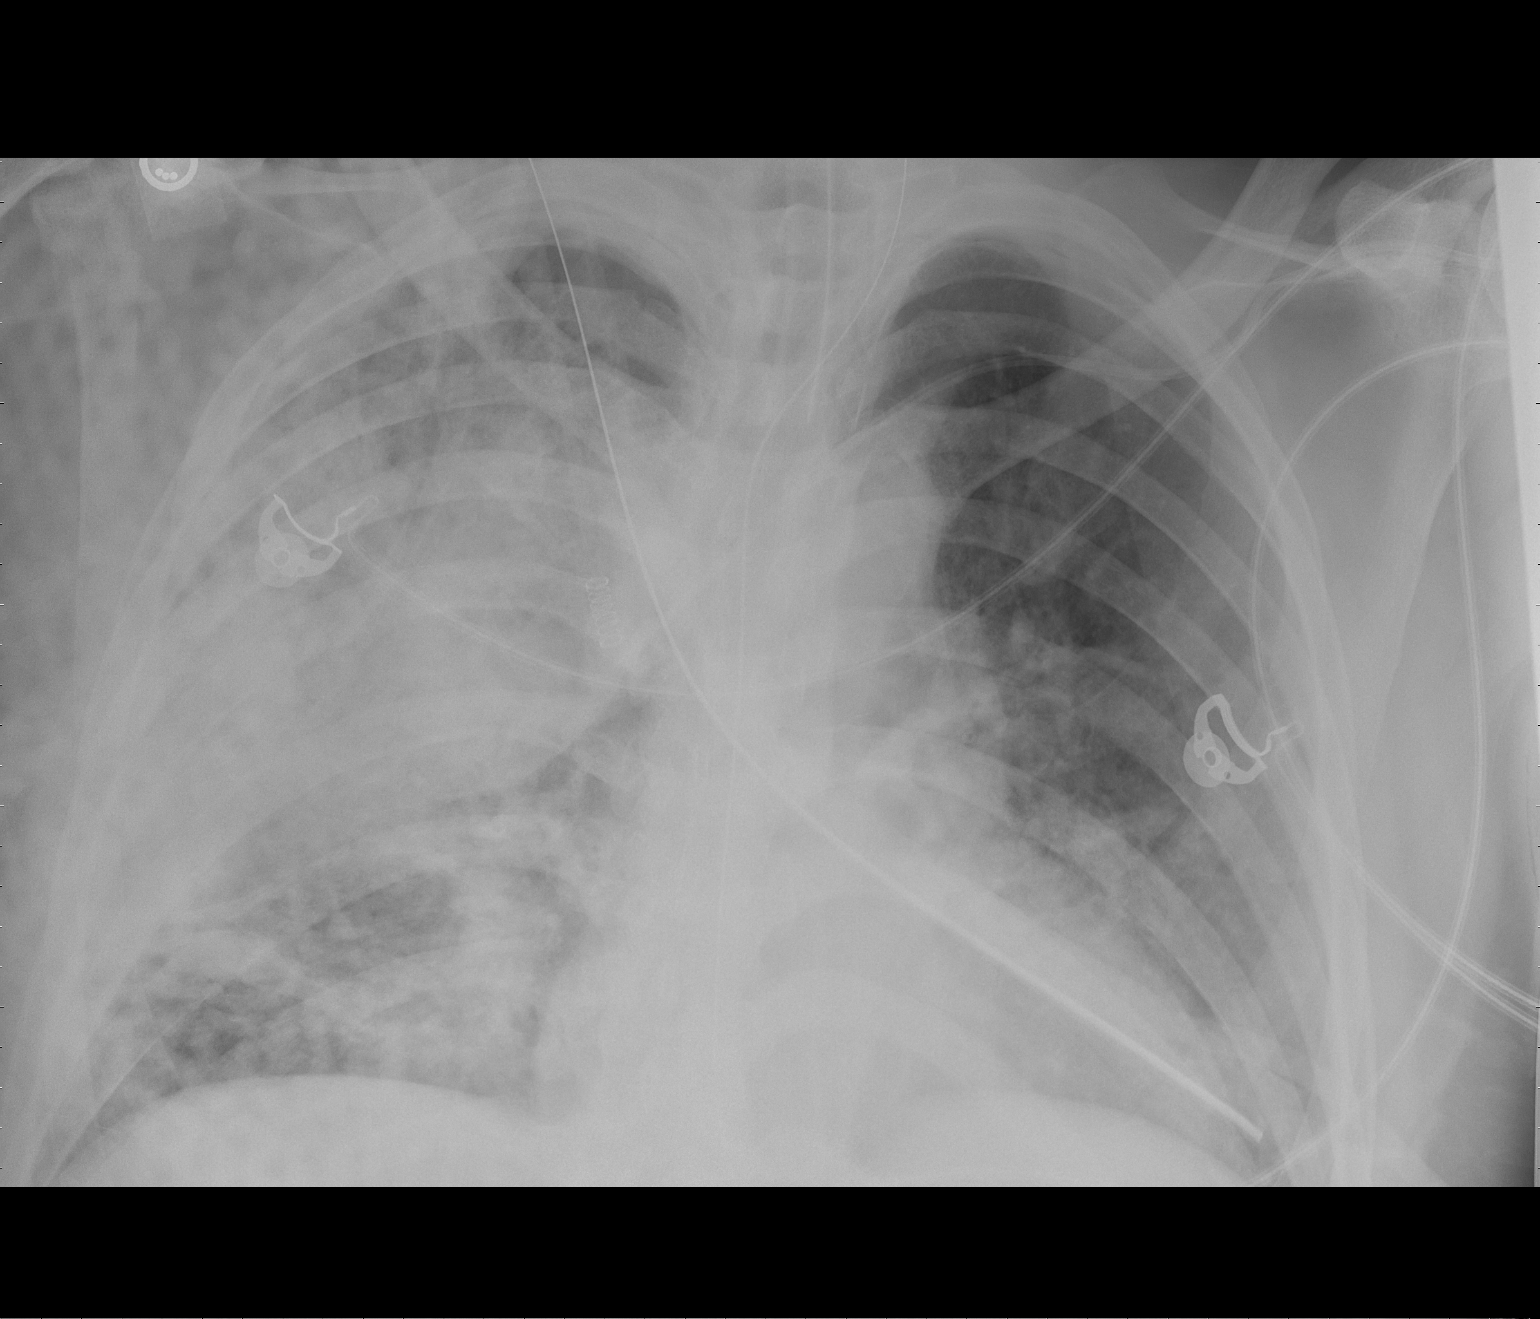

[1 of 1 positions shown; findings below may reference images not displayed]

FINDINGS: Stable positioning of endotracheal tube. The expansile necrotizing pneumonia depicted on recent CT in the posterior right upper lobe shows essentially stable appearance since the prior chest x-ray.  There is some increased potential involvement of the adjacent right lower lobe. No edema.
IMPRESSION: Appearance of right upper lobe pneumonia stable.  Probable increase in involvement of the right lower lobe.

## 2008-12-18 ENCOUNTER — Telehealth: Payer: Self-pay | Admitting: Internal Medicine

## 2008-12-18 ENCOUNTER — Ambulatory Visit: Payer: Self-pay | Admitting: Internal Medicine

## 2008-12-18 IMAGING — CR DG CHEST 1V PORT
1 series · 1 of 1 positions shown · non-contrast
Comparison: 09/28/07

CLINICAL DATA: Pneumonia.  
 PORTABLE CHEST ? 1 VIEW:

[view not recorded]
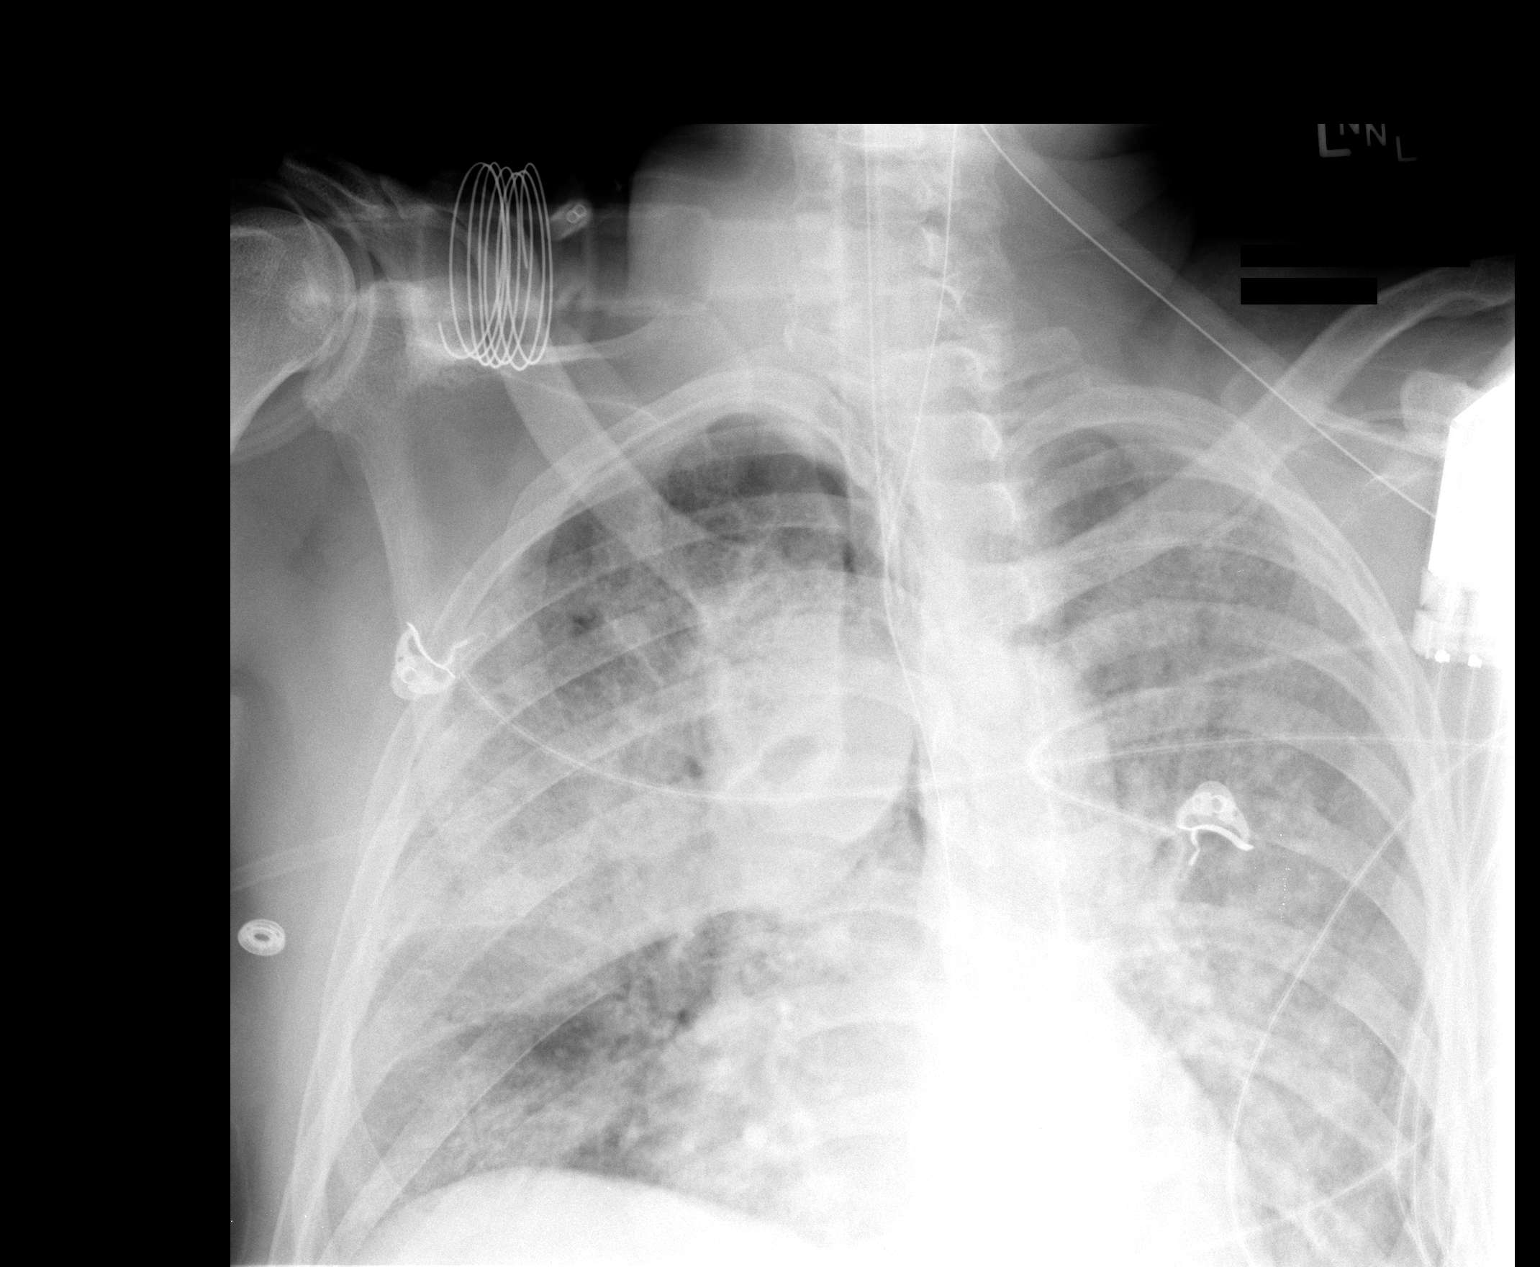

[1 of 1 positions shown; findings below may reference images not displayed]

FINDINGS: ET tube tip is stable above the carina.  Nasogastric tube is present.  Tip is below field of view. 
 Diffuse bilateral interstitial and airspace disease is again noted.  When compared with the prior exam, there is worsening aeration of right base.
IMPRESSION: 1.  Worsening aeration to the right lung base.
 2.  No change in diffuse airspace disease throughout the remaining portions of the lungs.

## 2008-12-19 ENCOUNTER — Inpatient Hospital Stay (HOSPITAL_COMMUNITY): Admission: EM | Admit: 2008-12-19 | Discharge: 2008-12-21 | Payer: Self-pay | Admitting: Emergency Medicine

## 2008-12-19 IMAGING — CR DG CHEST 1V PORT
1 series · 1 of 1 positions shown · non-contrast
Comparison: 09/29/07.

CLINICAL DATA: Pneumonia.  Renal insufficiency.  Tachycardia.
 PORTABLE CHEST ? 1 VIEW ? 09/30/07 ? 7477 HOURS:

[view not recorded]
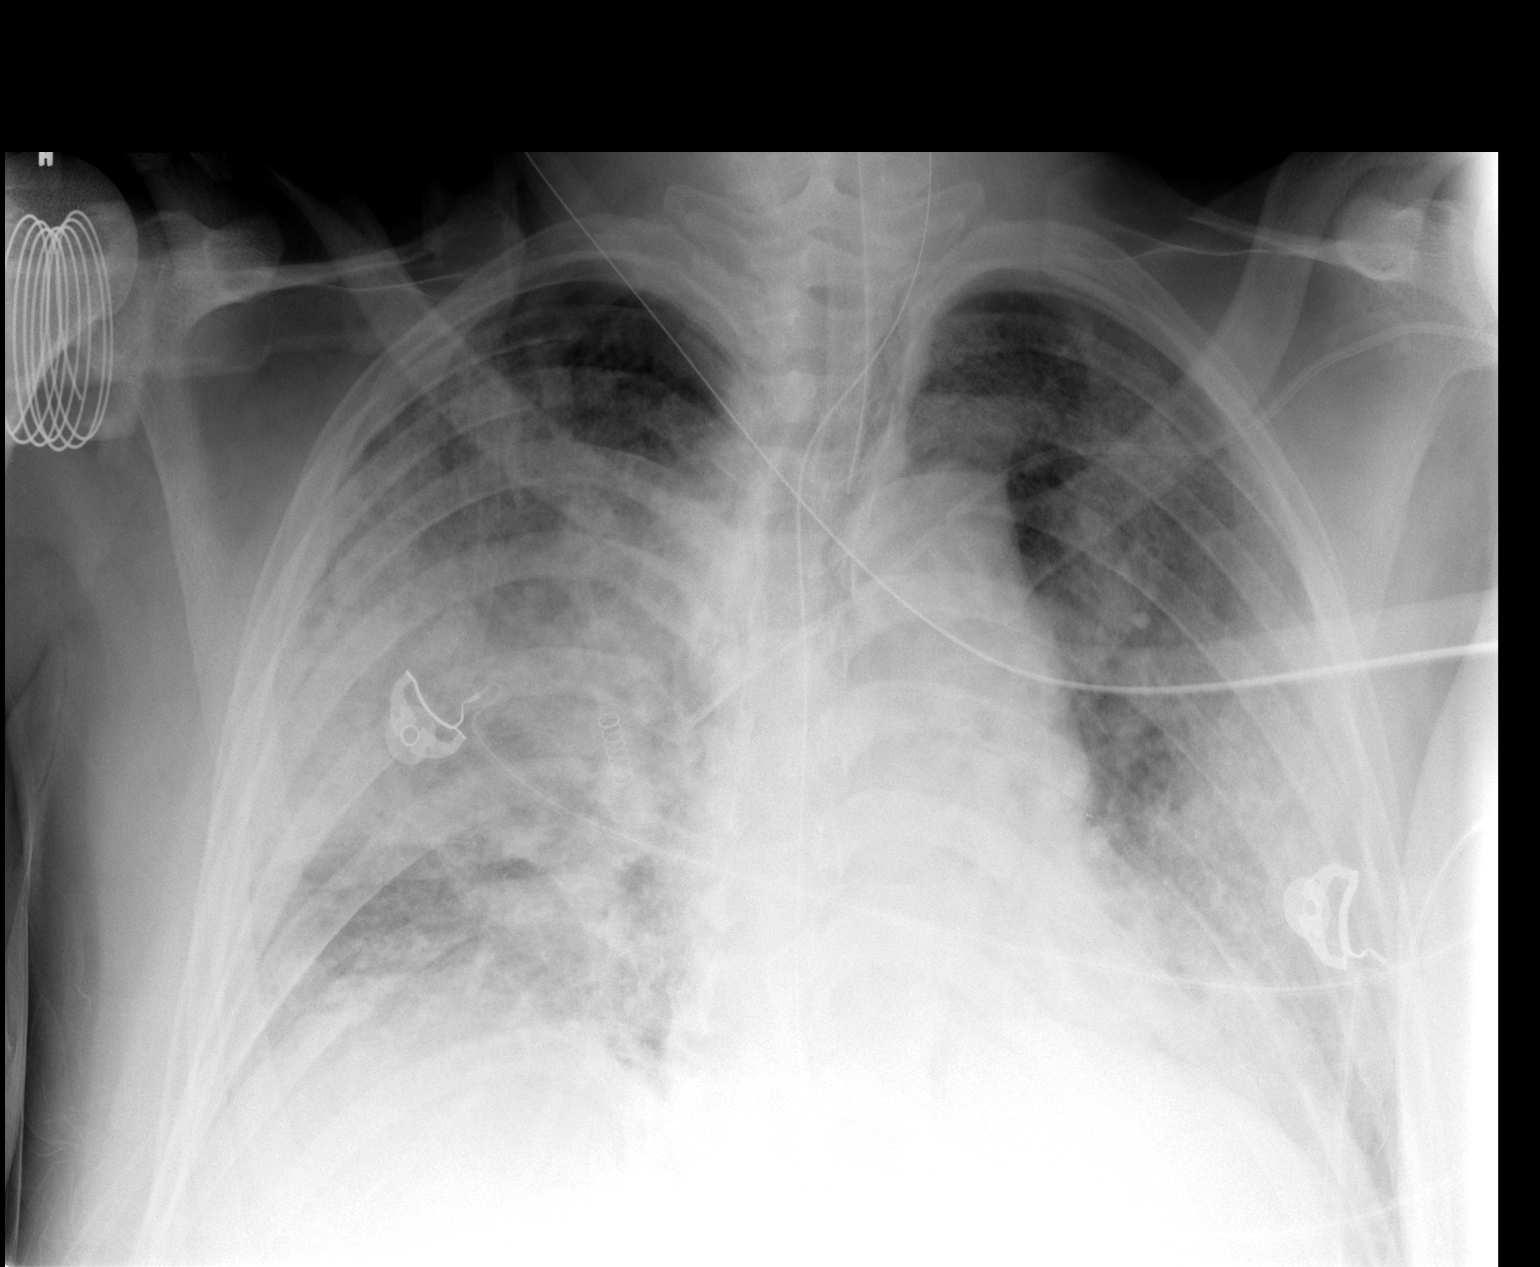

[1 of 1 positions shown; findings below may reference images not displayed]

FINDINGS: There is little change in diffuse airspace disease, right greater than left.  Endotracheal tube and left central venous line are unchanged.  NG tube remains.
IMPRESSION: Little change in diffuse patchy airspace disease.

## 2008-12-19 IMAGING — CT CT CHEST W/ CM
2 of 3 series · 15 of 36 positions shown, 18 images · IV contrast (APPLIED)
Comparison: CT chest 09/25/07.

CLINICAL DATA: Necrotizing pneumonia.  Evaluate for loculated pleural collection or abscess.  
 CHEST CT WITH CONTRAST:
TECHNIQUE: Multidetector CT imaging of the chest was performed following the standard protocol during bolus administration of intravenous contrast.
 Contrast:  100 cc Omnipaque 300.

[Series 2: routine chest 5.0 st · axial · 0.75mm/px · z∈[-280,-30]mm · 12 of 60 slices shown, 15 images]
[im 5/60  mediastinal]
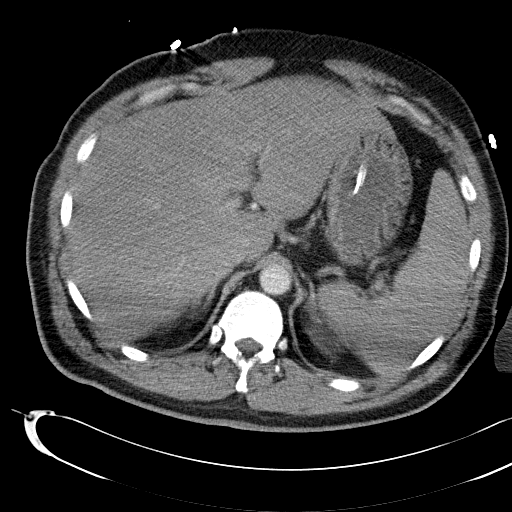
[im 5/60  lung]
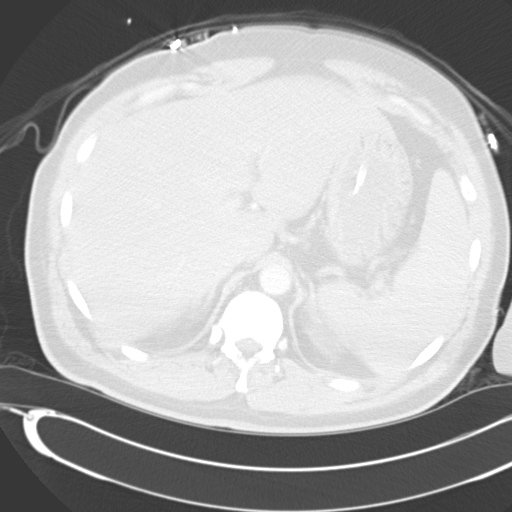
[im 9/60  lung]
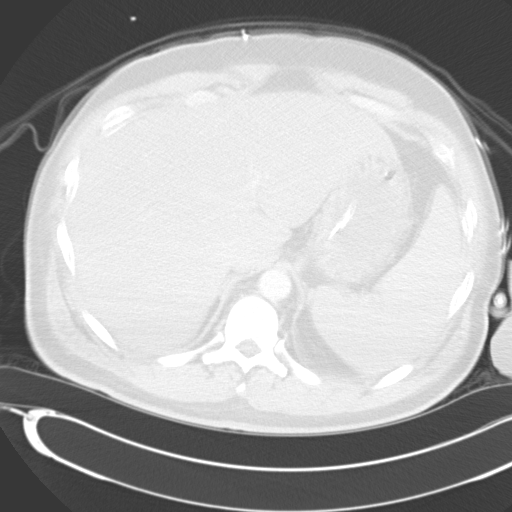
[im 14/60  lung]
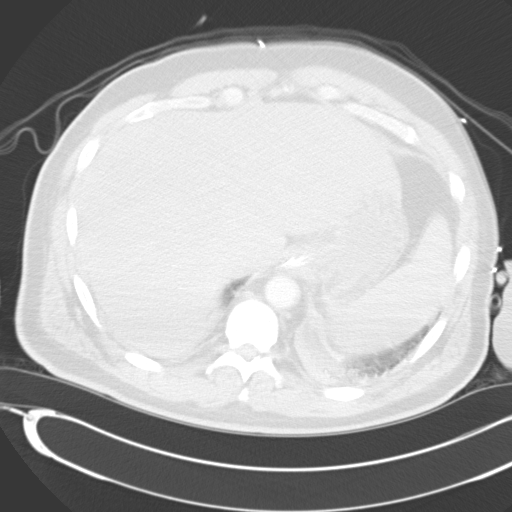
[im 18/60  lung]
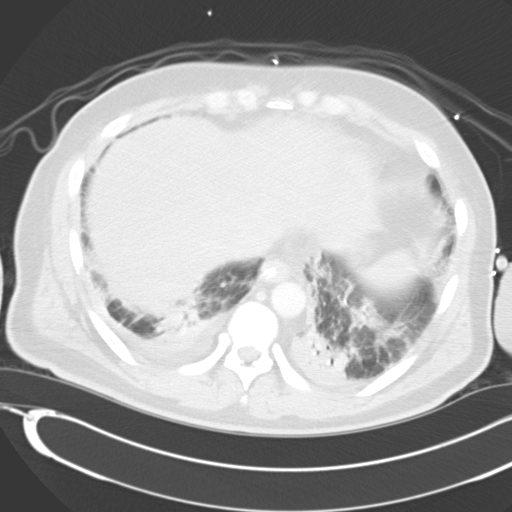
[im 22/60  mediastinal]
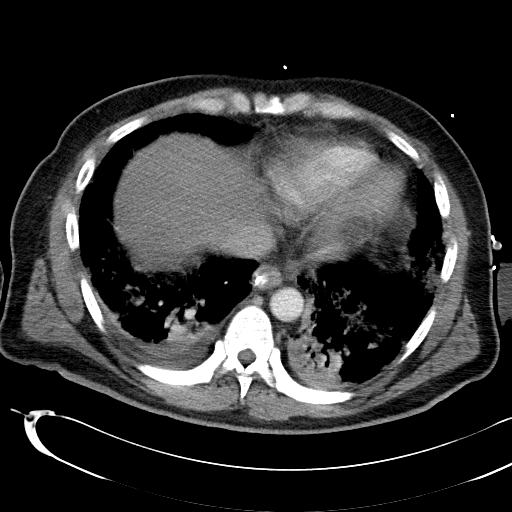
[im 22/60  lung]
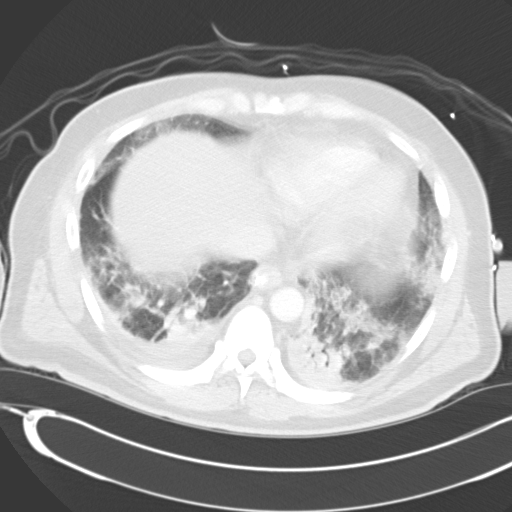
[im 27/60  lung]
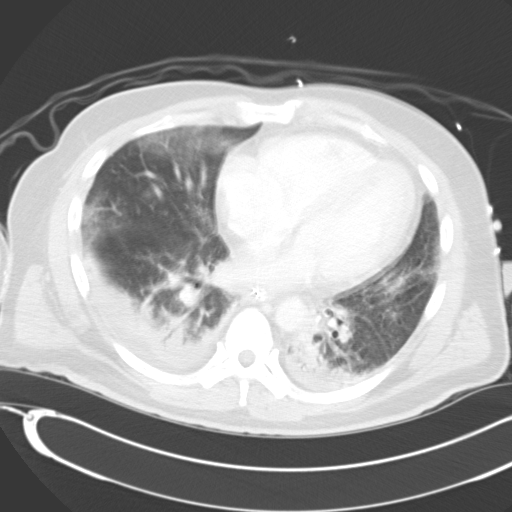
[im 33/60  lung]
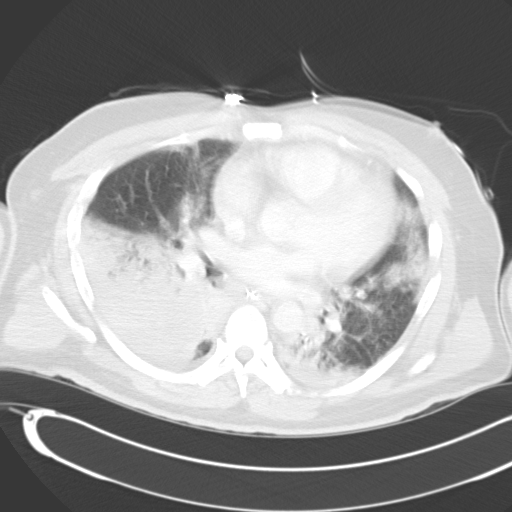
[im 38/60  lung]
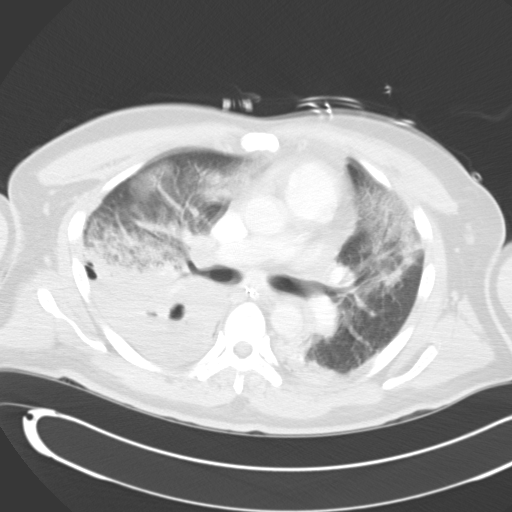
[im 42/60  mediastinal]
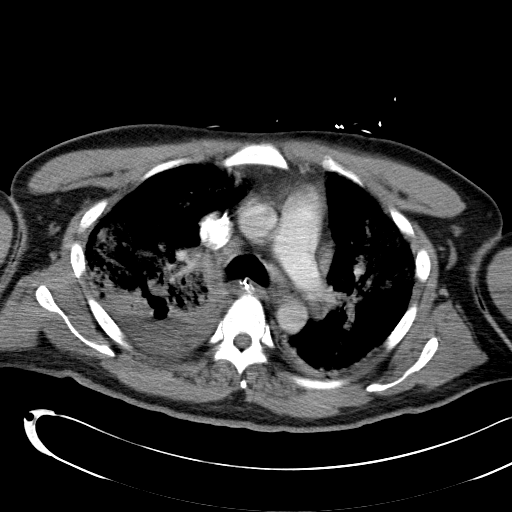
[im 42/60  lung]
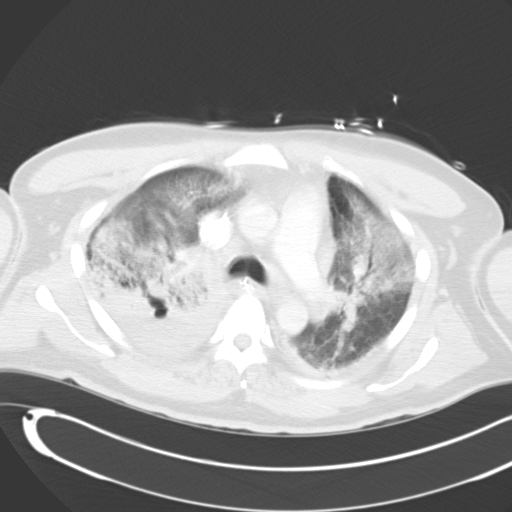
[im 46/60  lung]
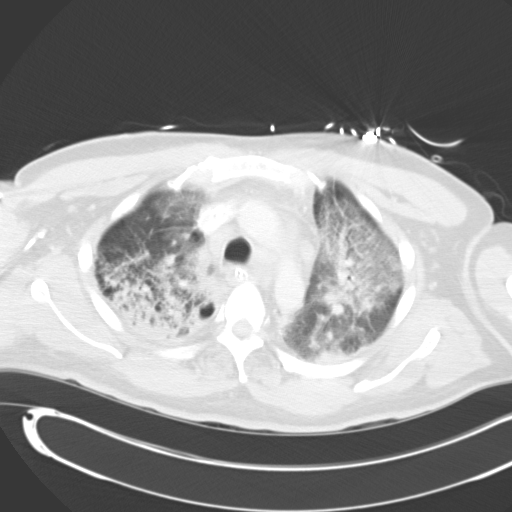
[im 51/60  lung]
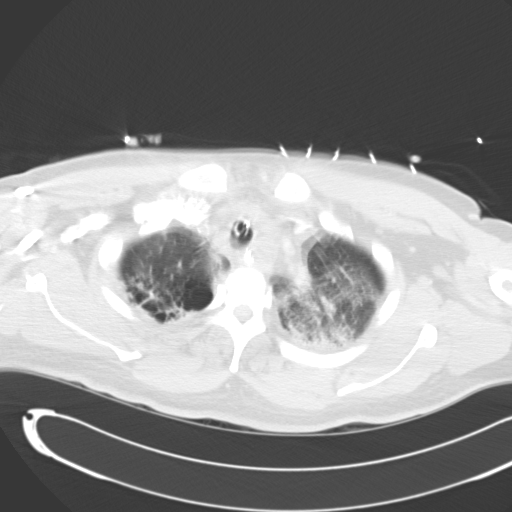
[im 55/60  lung]
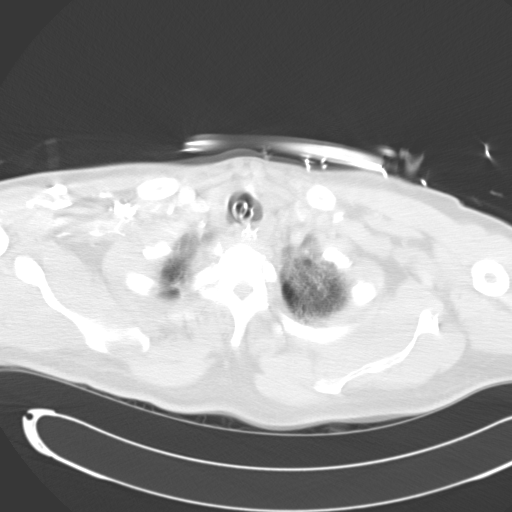

[Series 602: cor chest · coronal · 0.75mm/px · 3 of 53 slices shown]
[im 11/53  lung]
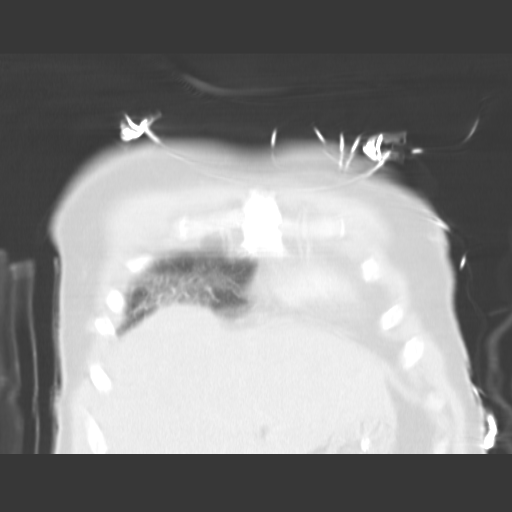
[im 21/53  lung]
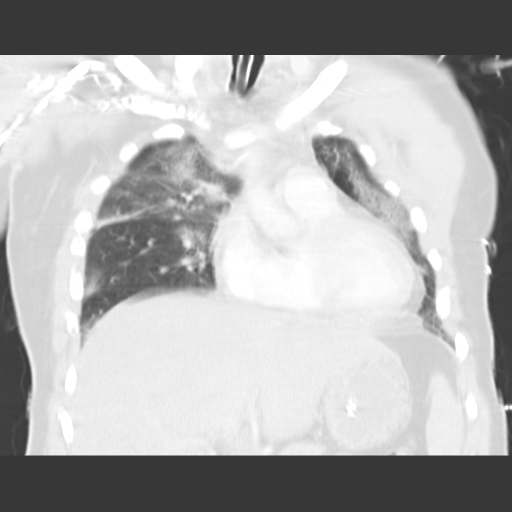
[im 32/53  lung]
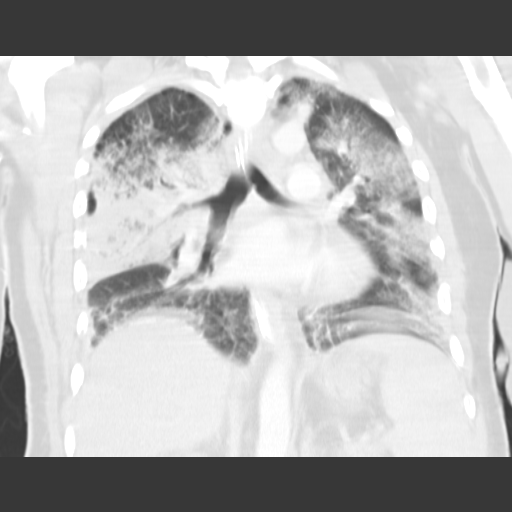

[15 of 36 positions shown; findings below may reference images not displayed]

FINDINGS: Compared to 09/25/07, there is persistent consolidation of the right lower lobe with worsening of opacity in the left lower lobe as well.  There also has been progression of airspace disease involving the lingula and to a lesser degree right middle lobe.  There is also more airspace disease involving both upper lobes.  Some cavitation is noted within the consolidated opacity within superior segment of the right lower lobe.  There is only a tiny right pleural effusion present.  No discrete abscess is detected.  Mediastinal and hilar nodes are most likely reactive.
IMPRESSION: There has been progression of necrosis and cavitation within the consolidated right lower lobe.  Progression of involvement of both upper lobes and the left lower lobe is also noted.  Only a tiny right pleural effusion is present.

## 2008-12-20 IMAGING — CR DG CHEST 1V PORT
1 series · 1 of 1 positions shown · non-contrast
Comparison: Chest CT of 09/30/07 and chest x-ray of 09/30/07.

CLINICAL DATA: Pneumonia, ventilator.
 PORTABLE CHEST ? 1 VIEW ? 10/01/07:

[AP]
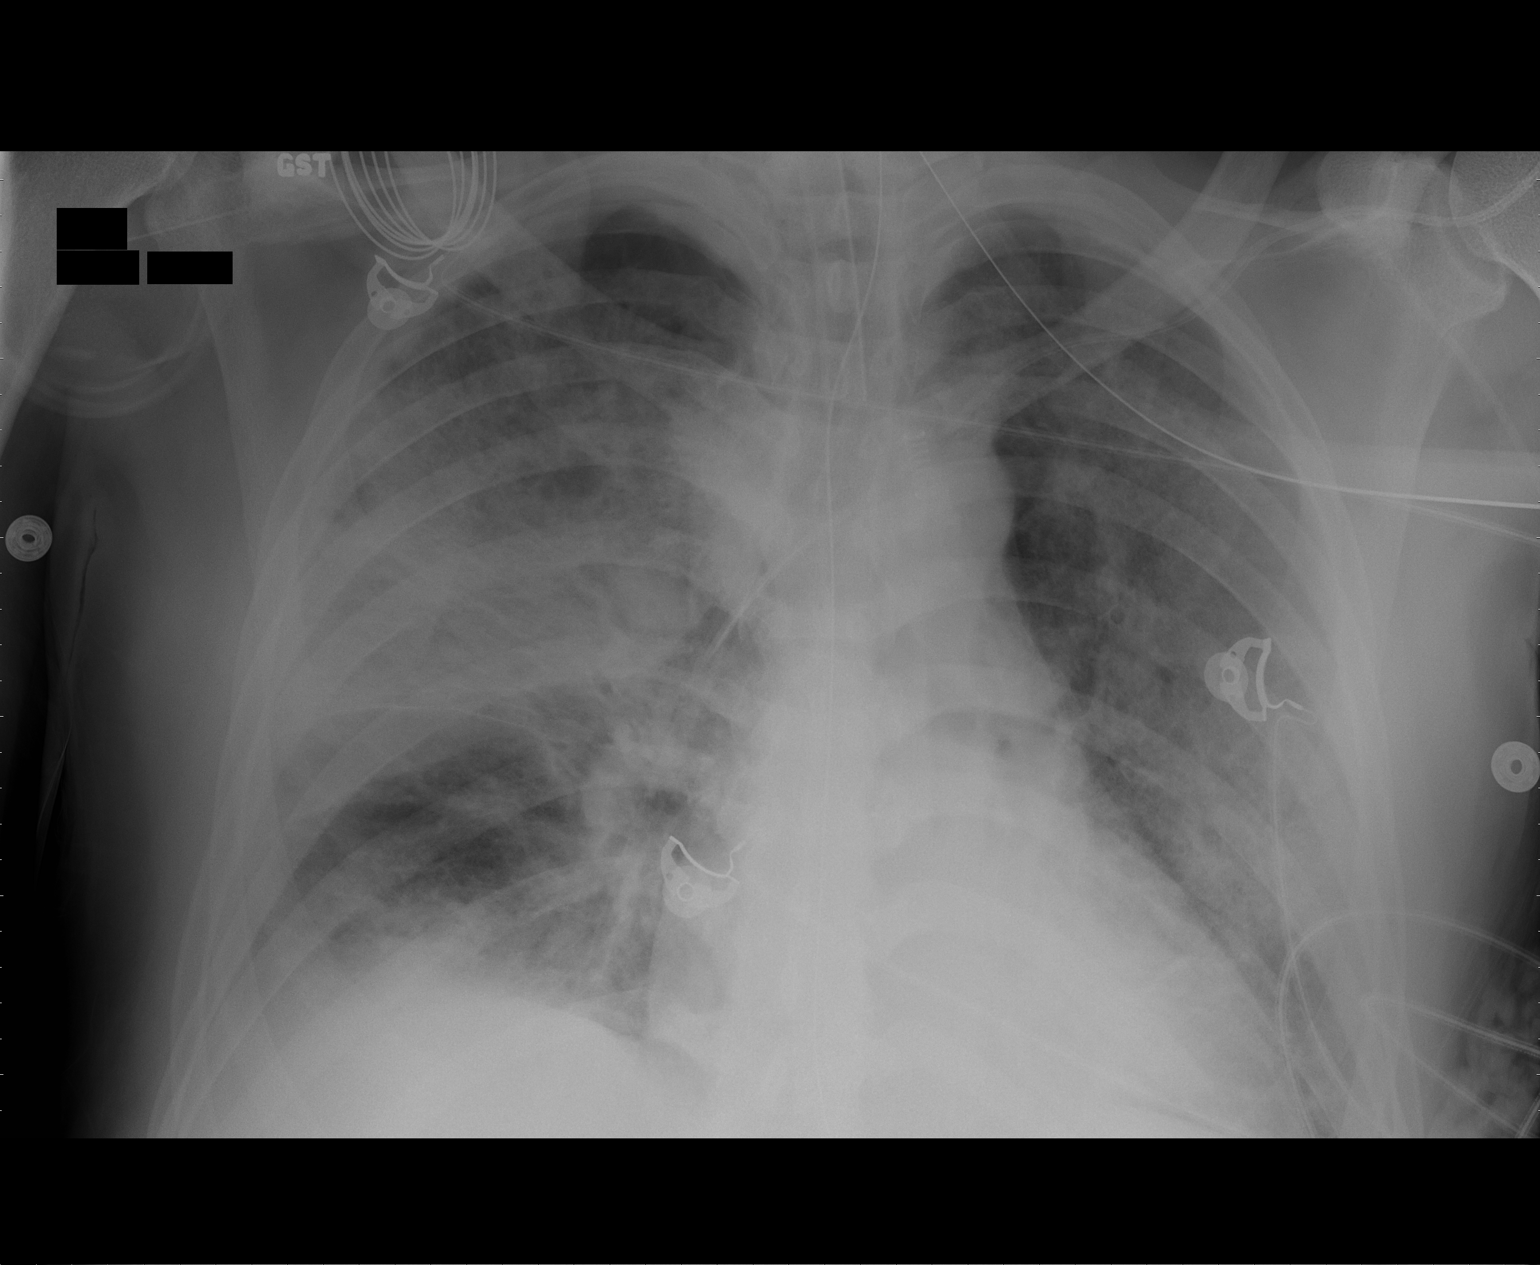

[1 of 1 positions shown; findings below may reference images not displayed]

FINDINGS: Endotracheal tube is in satisfactory position.  Nasogastric tube is followed into the stomach with the tip projecting beyond the inferior boundary of the film.  Left subclavian central line is in the proximal SVC.  Heart size is stable.
 There is severe bilateral airspace disease, worst in the right upper lobe.  Scattered lucencies within consolidated lung in the right upper lobe likely correspond to areas of necrosis on CT chest of 09/30/07.
IMPRESSION: Multilobar pneumonia with necrosis in the right upper lobe.

## 2008-12-21 IMAGING — CR DG CHEST 1V PORT
1 series · 1 of 1 positions shown · non-contrast
Comparison: Earlier today.

CLINICAL DATA: Status post bronchoscopy.  
 PORTABLE CHEST - 1 VIEW AT 6747 HOURS:

[view not recorded]
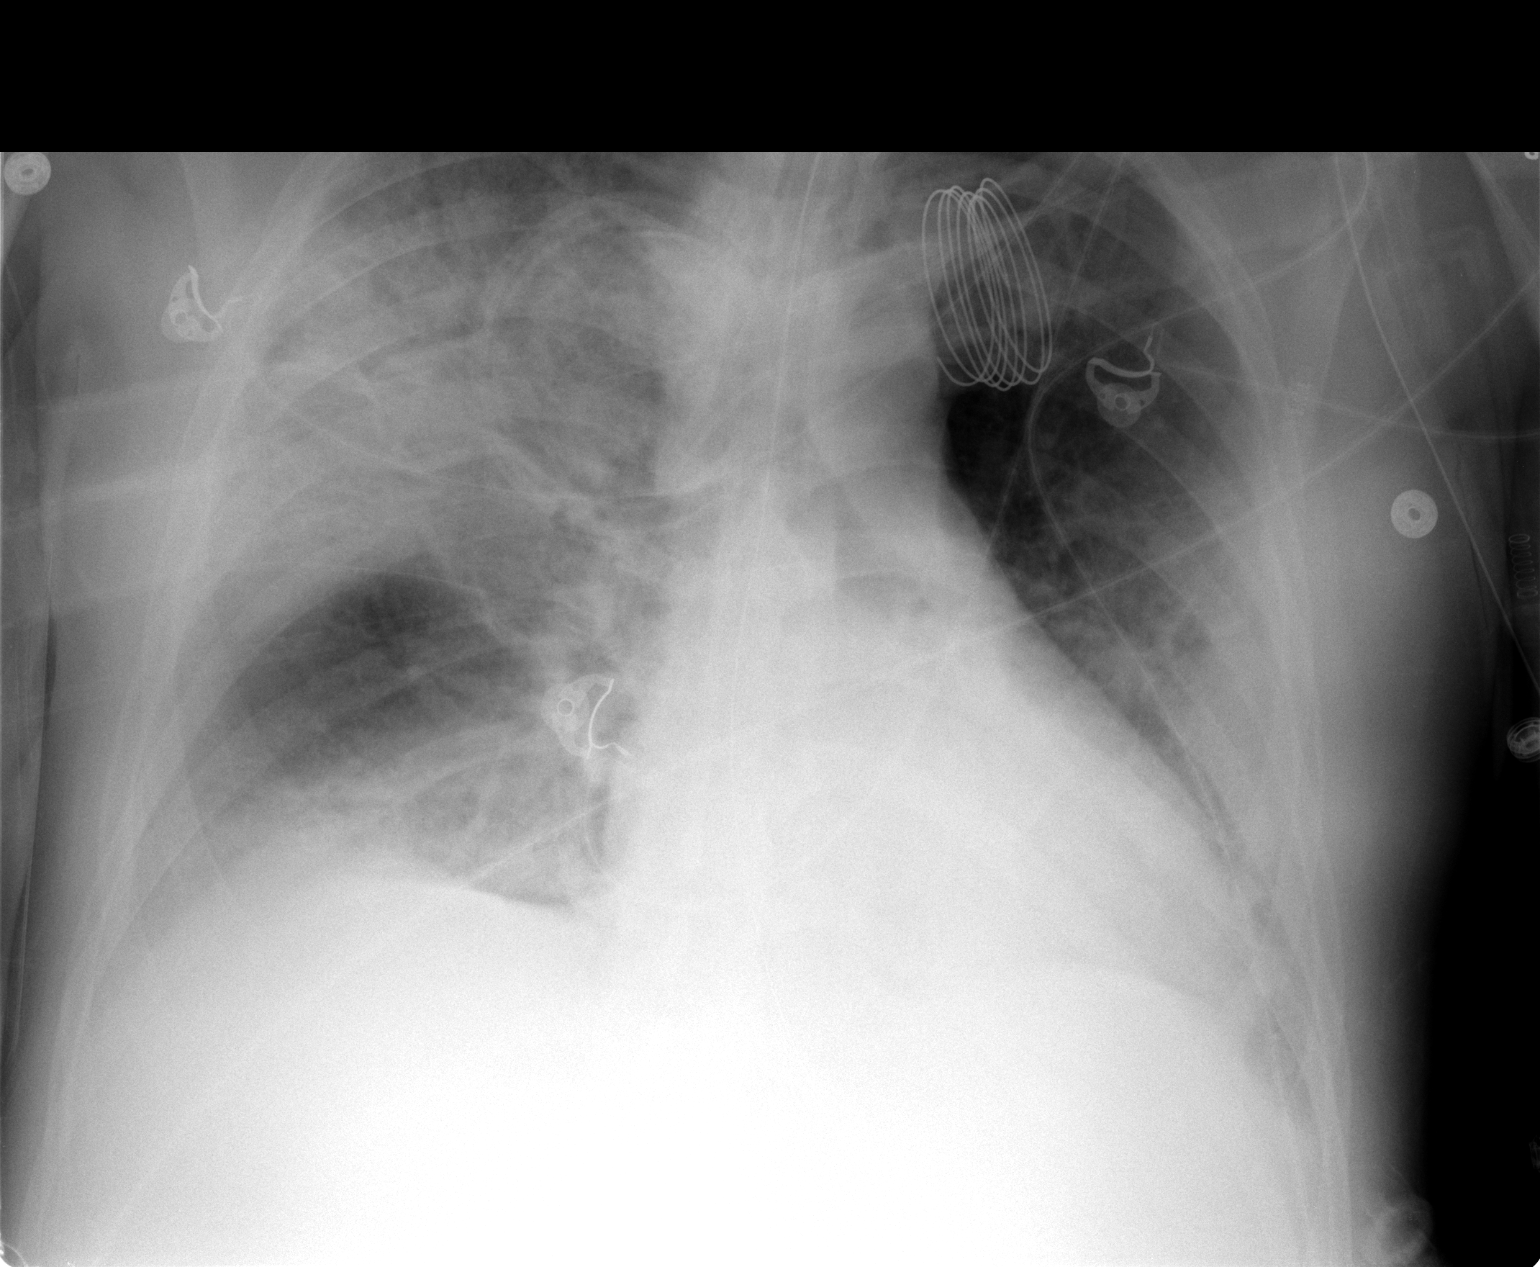

[1 of 1 positions shown; findings below may reference images not displayed]

FINDINGS: Endotracheal tube remains in good position as does the left central venous catheter.  Aeration has improved, but parenchymal opacity remains particularly in the right upper lung field and to a lesser degree at both lung bases.  No pneumothorax is seen.
IMPRESSION: Better aeration but persistent parenchymal opacity in right upper lobe and at both lung bases.

## 2008-12-22 ENCOUNTER — Telehealth (INDEPENDENT_AMBULATORY_CARE_PROVIDER_SITE_OTHER): Payer: Self-pay | Admitting: *Deleted

## 2008-12-22 ENCOUNTER — Telehealth: Payer: Self-pay | Admitting: Family Medicine

## 2008-12-22 IMAGING — CR DG CHEST 1V PORT
1 series · 1 of 1 positions shown · non-contrast
Comparison: One day prior

CLINICAL DATA: Respiratory distress.

CHEST - 1 VIEW

[AP]
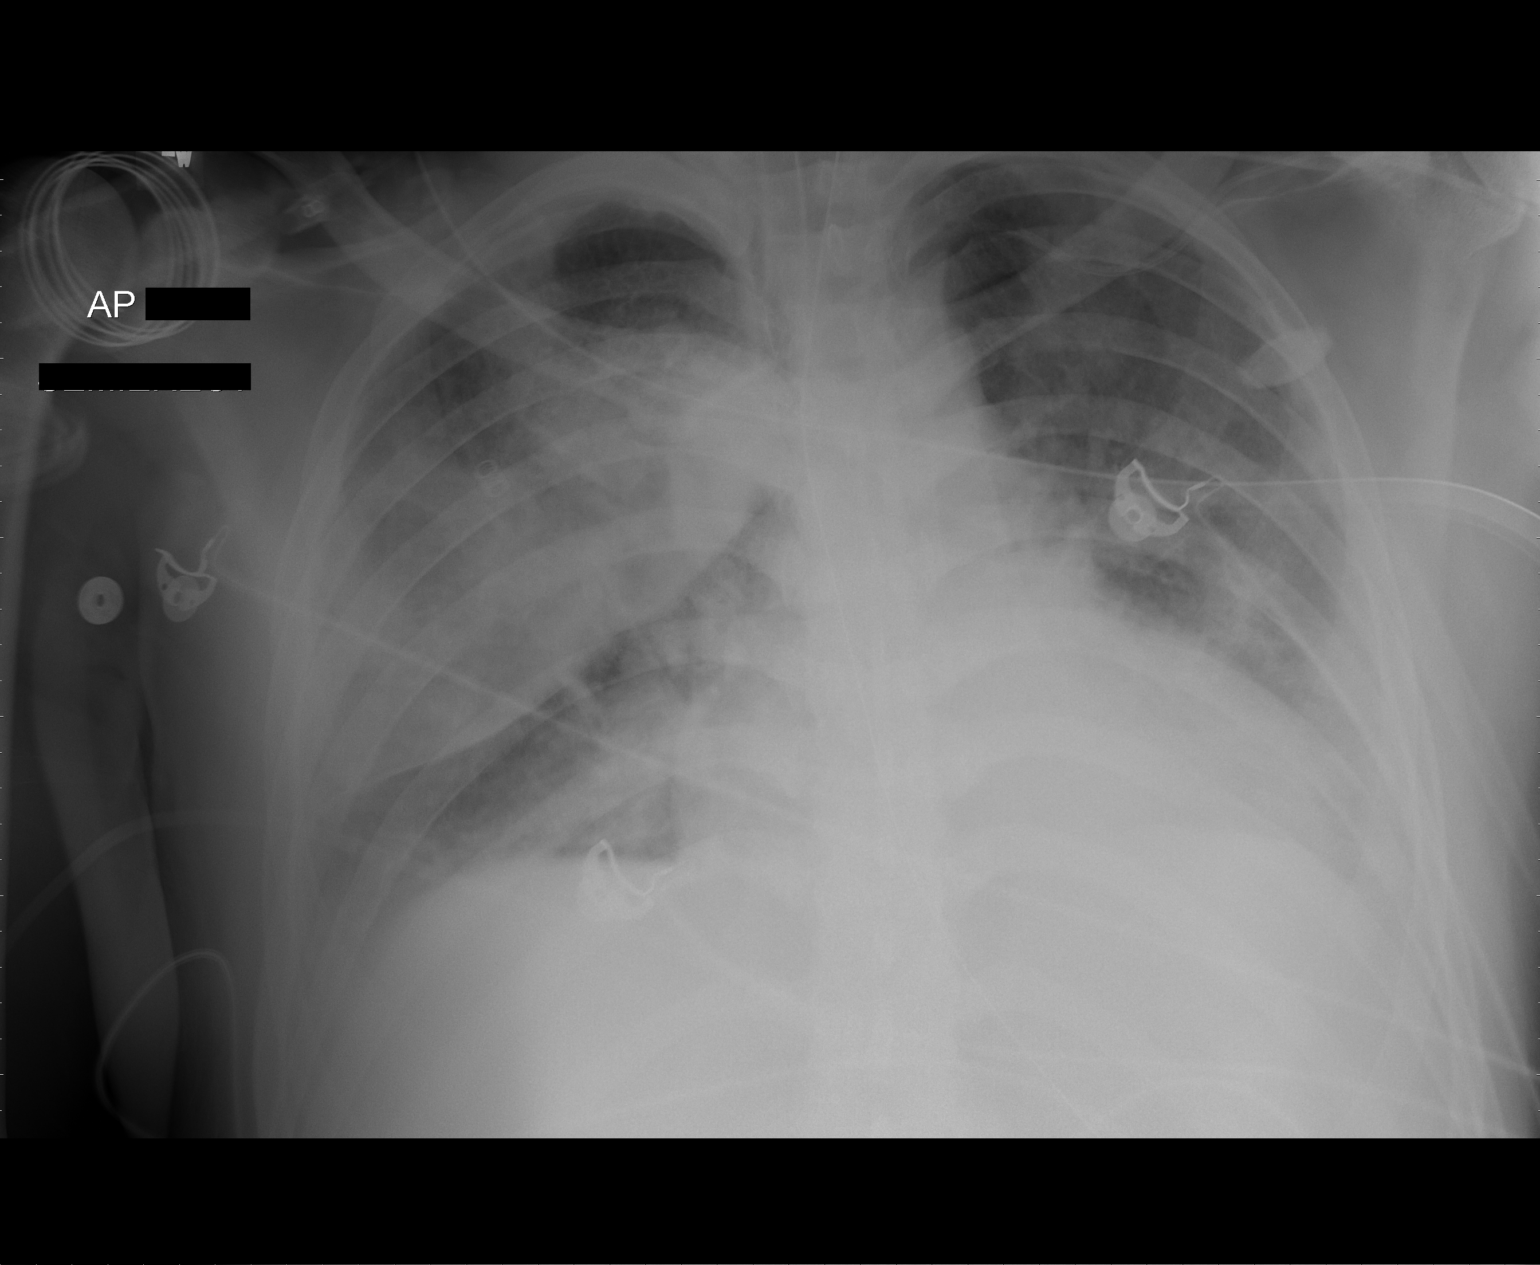

[1 of 1 positions shown; findings below may reference images not displayed]

FINDINGS: Left-sided subclavian line nasogastric tube and endotracheal tube all
unchanged in position. Moderate cardiomegaly. Bilateral pleural effusions likely
increased. No pneumothorax. Increased interstitial edema. Decreased lung
volumes.

Worsened right upper lobe aeration with suggestion of mass-effect on the right
minor fissure. More patchy bibasilar airspace disease is similar.

IMPRESSION

1. Worsening right upper lobe pneumonia with apparent mass-effect on the right
minor fissure.
2. Underlying edema and bibasilar atelectasis persist.
3. Increased bilateral pleural effusions.

## 2008-12-23 IMAGING — CR DG CHEST 1V PORT
1 series · 1 of 1 positions shown · non-contrast
Comparison: 10/03/07.

CLINICAL DATA: Pneumonia.  Tachycardia.  
 PORTABLE CHEST ? 1 VIEW ? 10/04/07 ? 7777 HOURS:

[view not recorded]
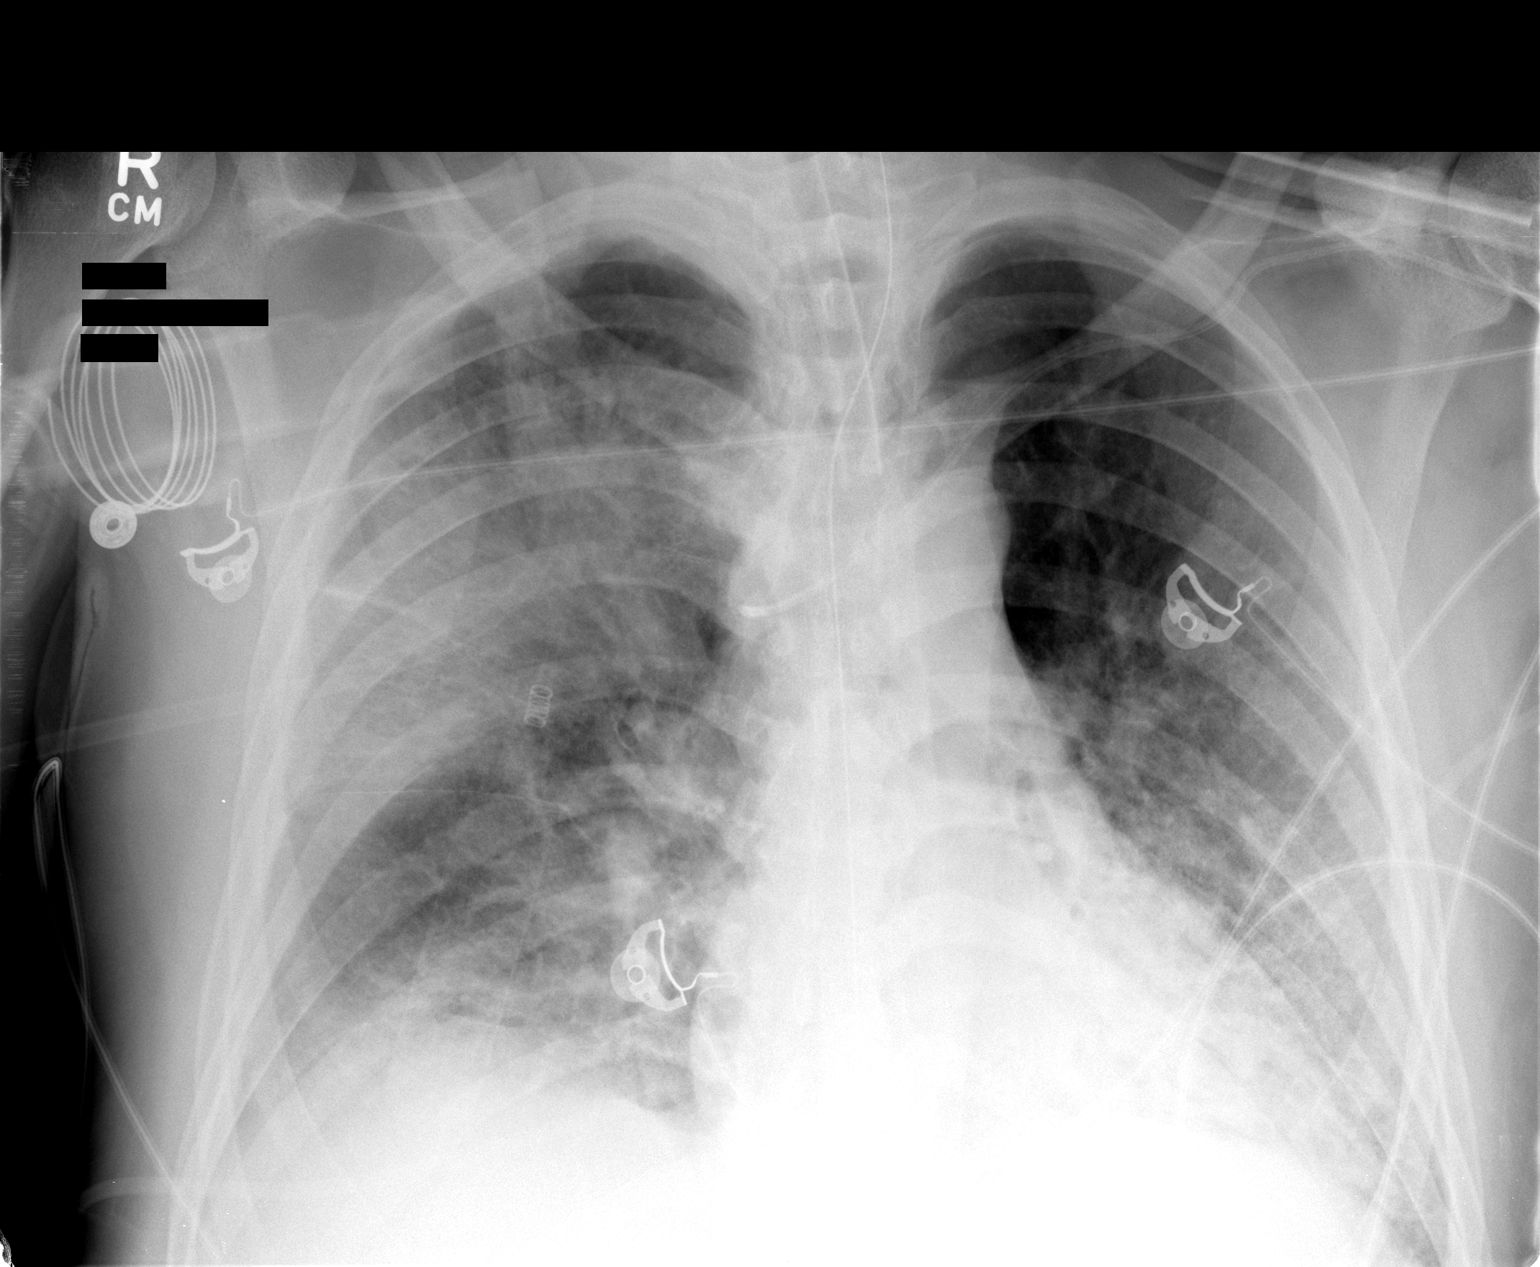

[1 of 1 positions shown; findings below may reference images not displayed]

FINDINGS: There is improved aeration with persistent opacity within the right mid upper lung field.  Basilar infiltrates remain left greater than right.  Endotracheal tube is in good position.  Left central venous line is unchanged.
IMPRESSION: Improved aeration but persistent bilateral patchy lung infiltrates.

## 2008-12-24 ENCOUNTER — Ambulatory Visit: Payer: Self-pay | Admitting: Internal Medicine

## 2008-12-24 DIAGNOSIS — J441 Chronic obstructive pulmonary disease with (acute) exacerbation: Secondary | ICD-10-CM | POA: Insufficient documentation

## 2008-12-24 IMAGING — CR DG CHEST 1V PORT
1 series · 1 of 1 positions shown · non-contrast
Comparison: 10/04/07.

CLINICAL DATA: 46 year-old-male with pneumonia. 
 PORTABLE CHEST - 1 VIEW ? 10/05/07:

[view not recorded]
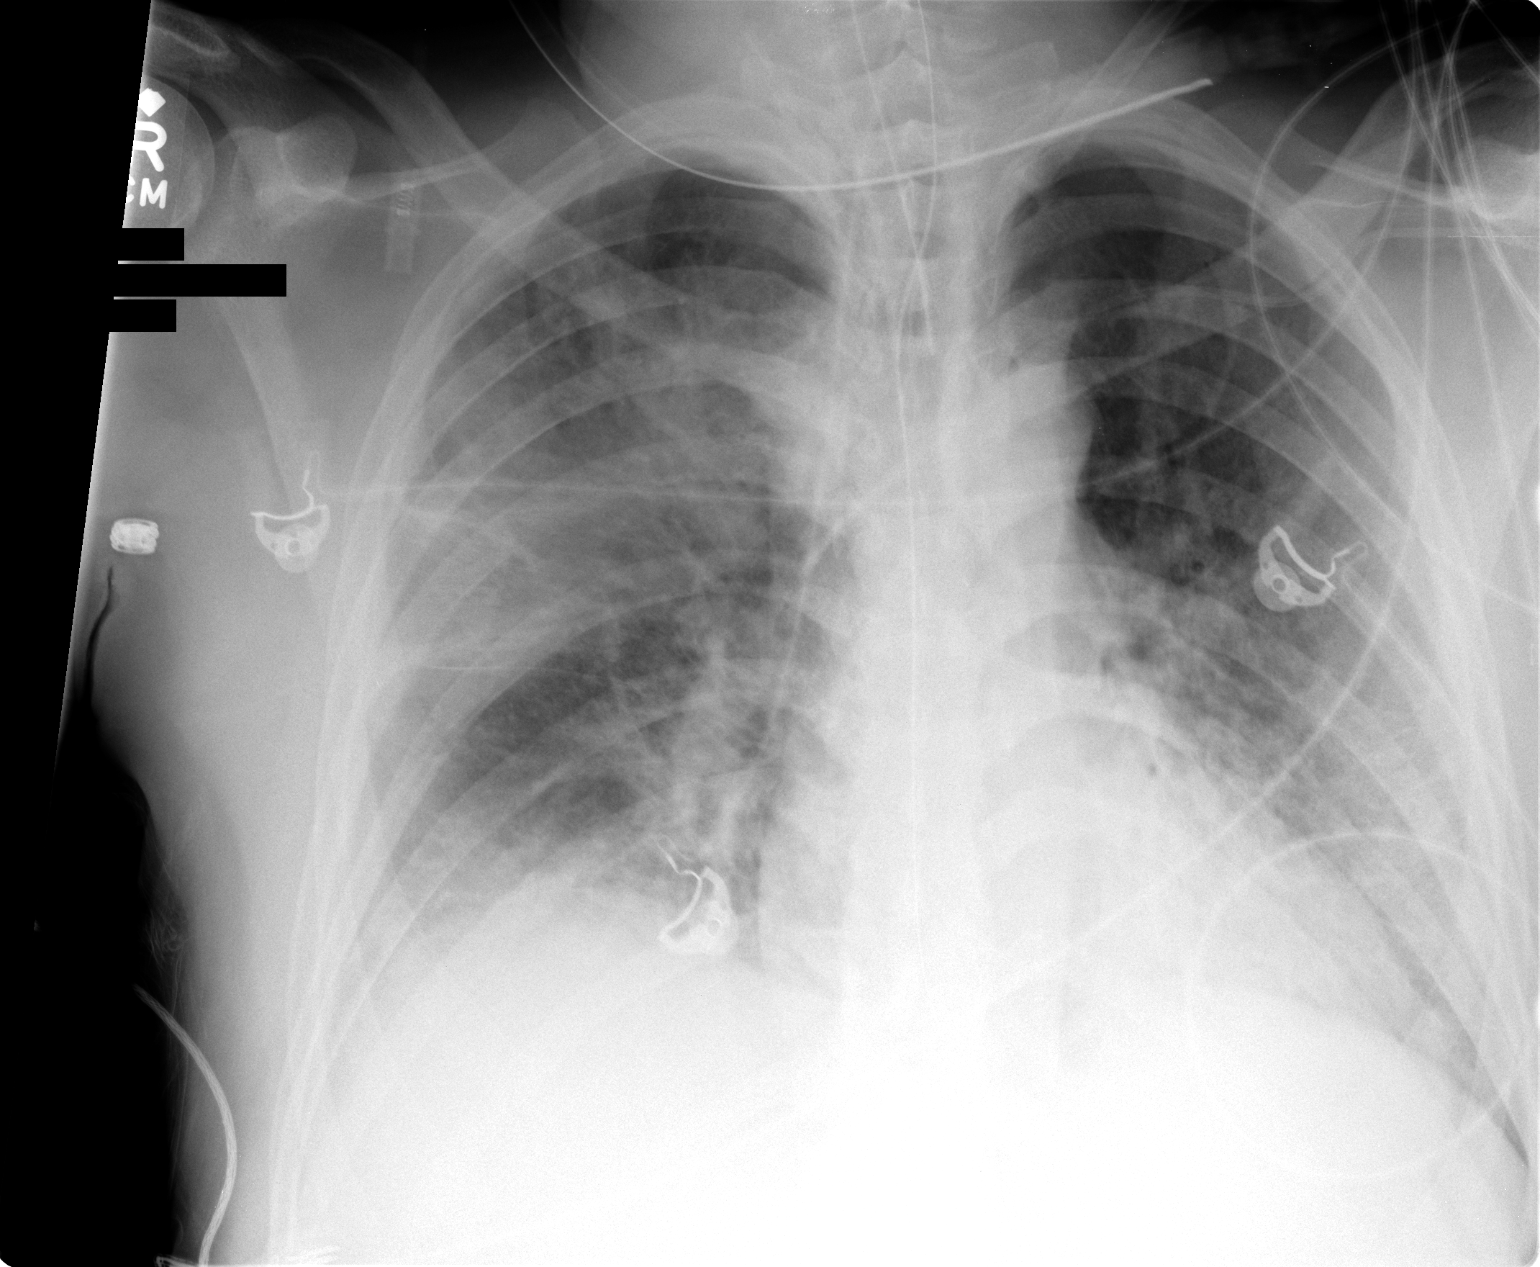

[1 of 1 positions shown; findings below may reference images not displayed]

FINDINGS: Single view of the chest again demonstrates an endotracheal tube, nasogastric tube and left central venous catheter.  Persistent airspace disease in the right upper lung which is slightly improved.  Persistent interstitial densities in the lower lung.
IMPRESSION: Minimal change with slightly improved aeration of the right upper lung.

## 2008-12-24 IMAGING — CR DG CHEST 1V PORT
1 series · 1 of 1 positions shown · non-contrast
Comparison: none

CLINICAL DATA: 46-year-old with pneumonia.  Trach tube placement. 
 PORTABLE CHEST:

[view not recorded]
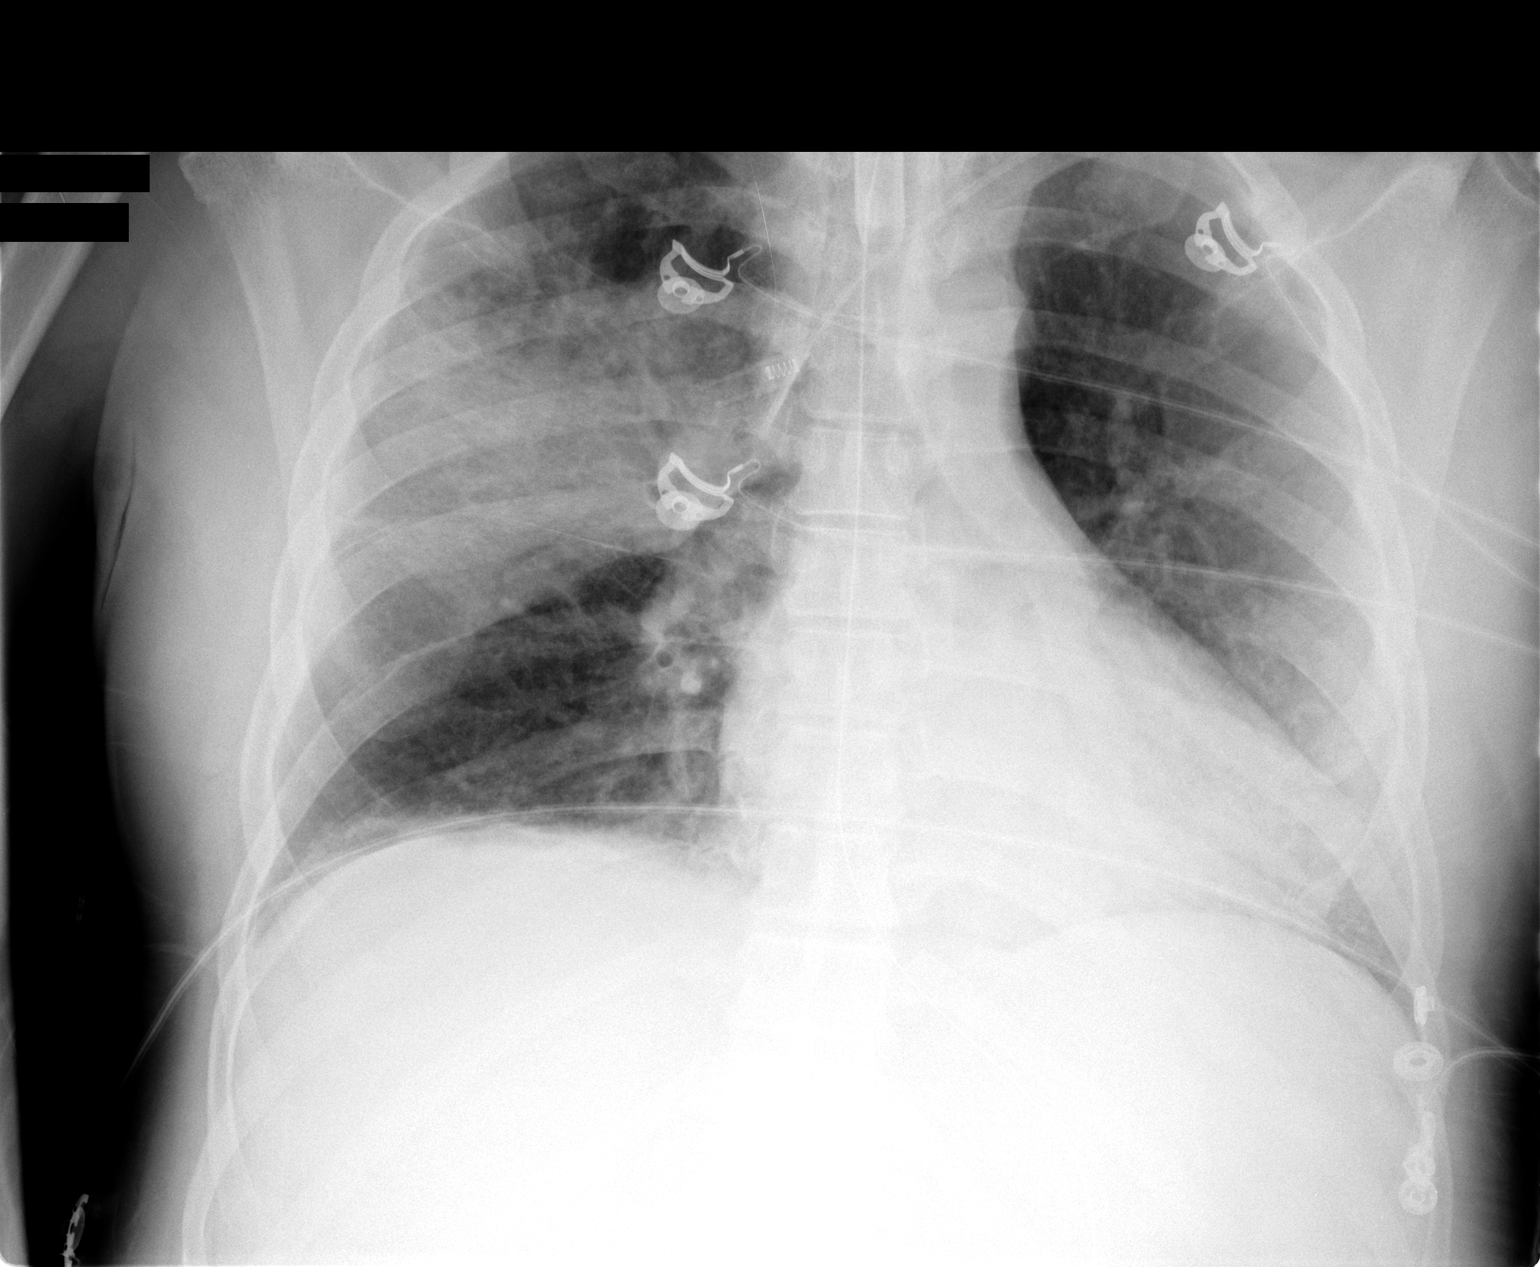

[1 of 1 positions shown; findings below may reference images not displayed]

FINDINGS: The tracheostomy tube is in good position.  The tip is 5 cm above the carina.  The remaining support apparatus is stable.  Persistent bilateral infiltrates.
IMPRESSION: 1.  Tracheostomy tube in good position without complicating features. 
 2.  Persistent bilateral infiltrates.  Slight improved bibasilar aeration.

## 2008-12-25 IMAGING — CR DG CHEST 1V PORT
1 series · 1 of 1 positions shown · non-contrast
Comparison: 10/05/07.

CLINICAL DATA: Pneumonia.
 PORTABLE CHEST - 1 VIEW (5855 hours):

[view not recorded]
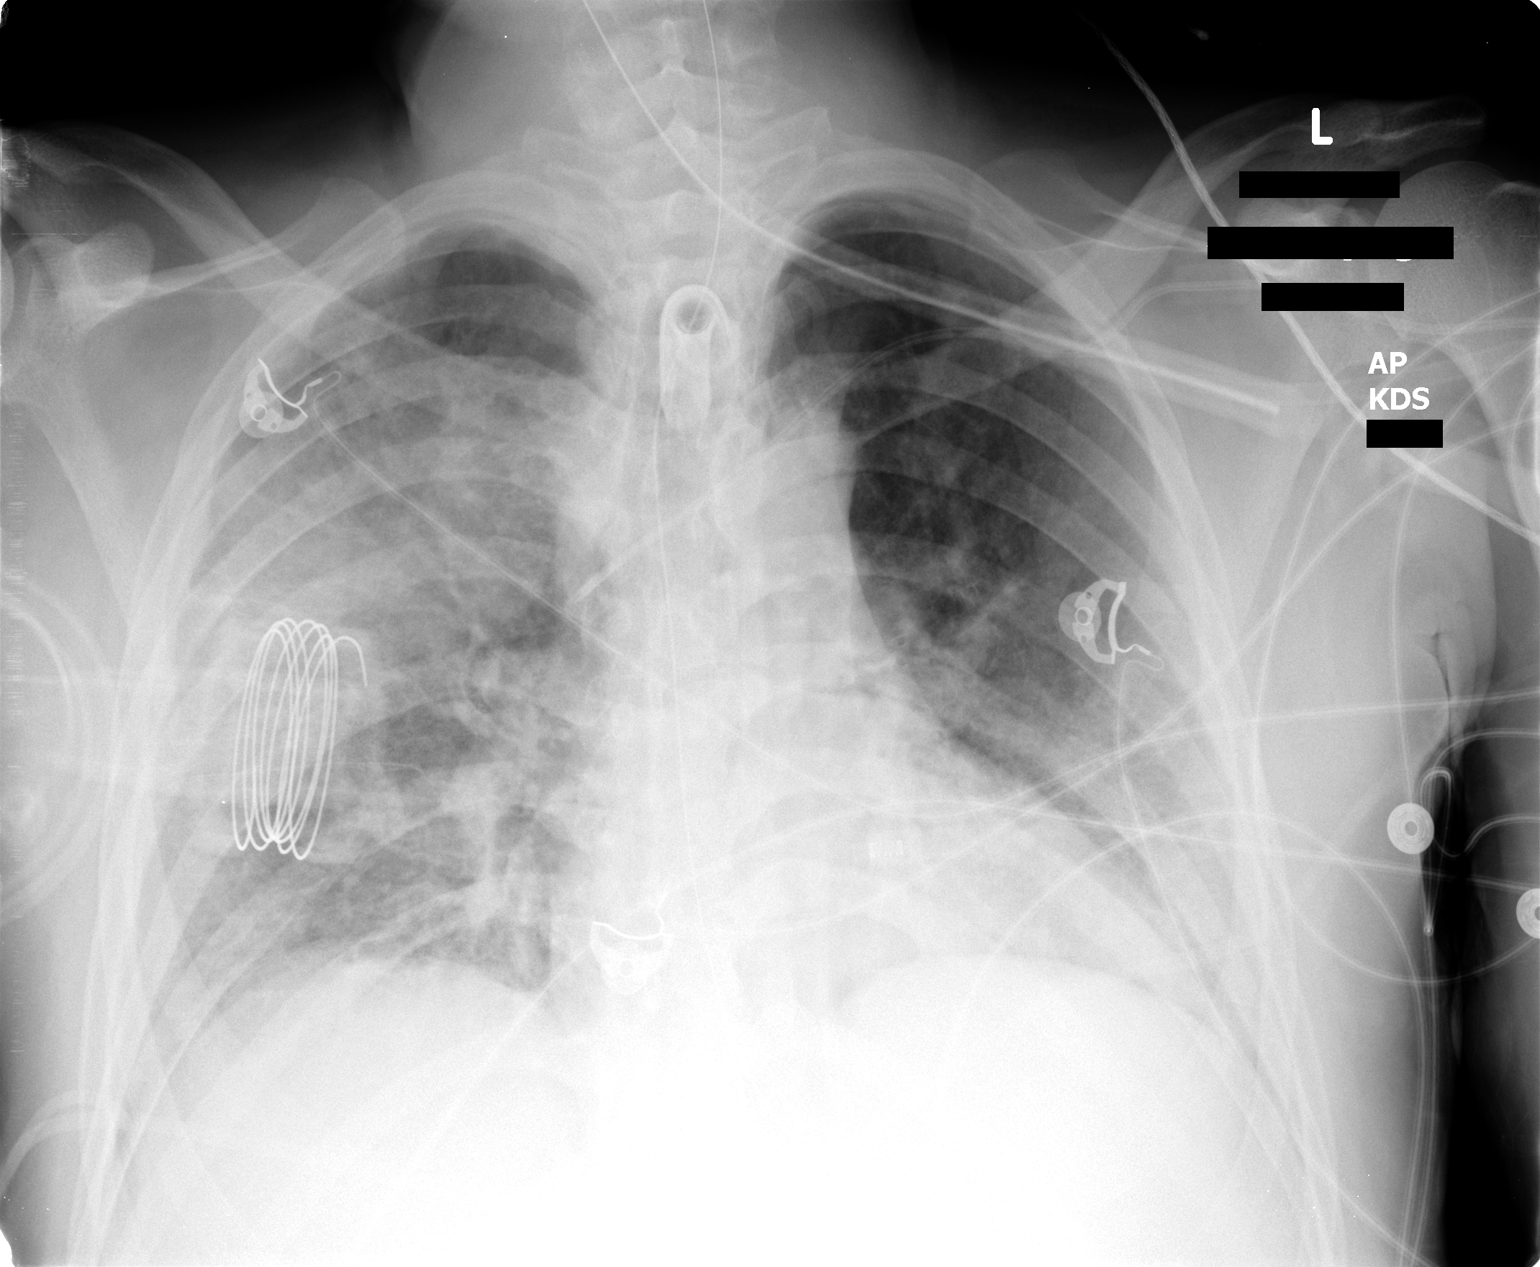

[1 of 1 positions shown; findings below may reference images not displayed]

FINDINGS: Right upper lobe consolidation is stable.  Airspace disease throughout the left lung and right base has increased. The left subclavian central venous catheter, tracheostomy tube, and NG tube are stable. No pneumothorax.
IMPRESSION: Worsening diffuse bilateral airspace disease. Right upper lobe consolidation persists.

## 2008-12-27 IMAGING — CR DG CHEST 1V PORT
1 series · 1 of 1 positions shown · non-contrast
Comparison: 10/06/07.

CLINICAL DATA: Pneumonia.  Tachycardia.  Respiratory distress. 
 PORTABLE CHEST - 1 VIEW:

[AP]
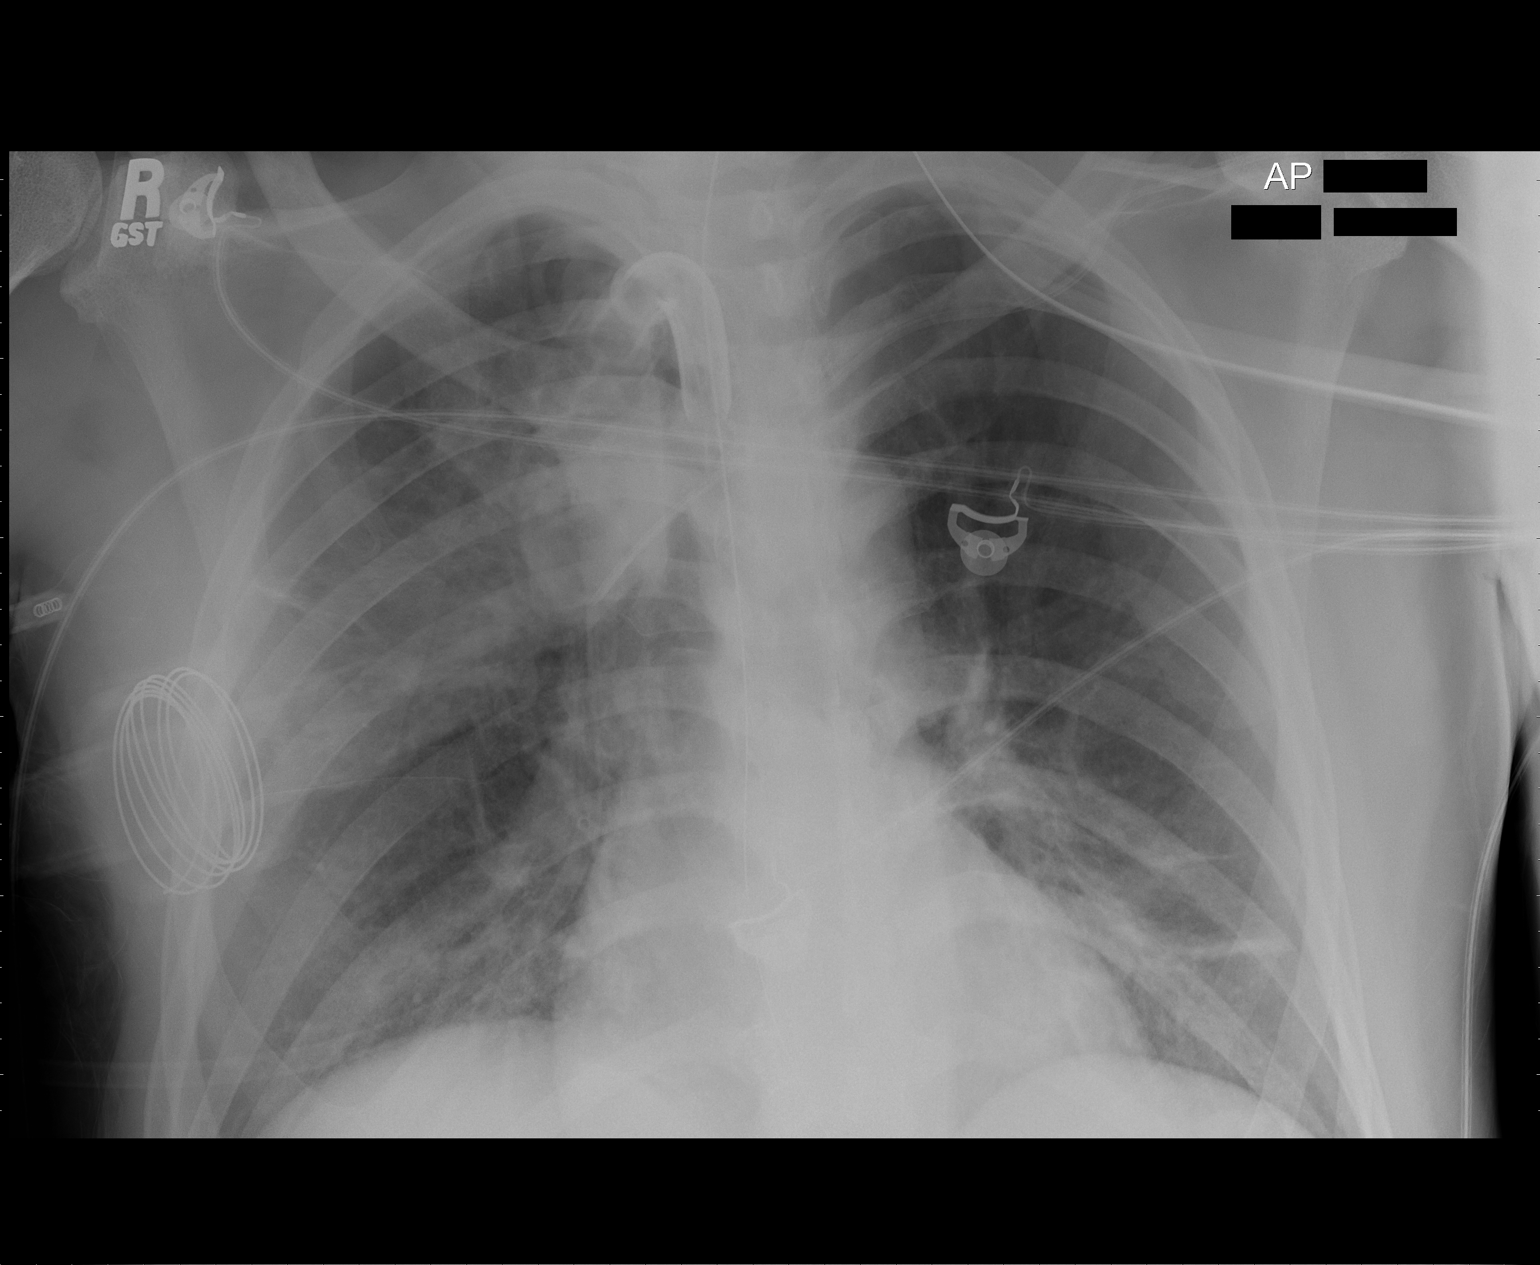

[1 of 1 positions shown; findings below may reference images not displayed]

FINDINGS: Tracheostomy tube, NG tube, left central venous catheter again noted.  Decreased pulmonary vascular congestion and improved bilaterally noted.  Decreased air space disease throughout bilateral lungs noted.
IMPRESSION: Decreased bilateral air space disease/edema and pulmonary vascular congestion.

## 2008-12-28 IMAGING — CR DG CHEST 1V PORT
1 series · 1 of 1 positions shown · non-contrast
Comparison: 10/08/2007

CLINICAL DATA: Pneumonia. Respiratory distress.

[AP]
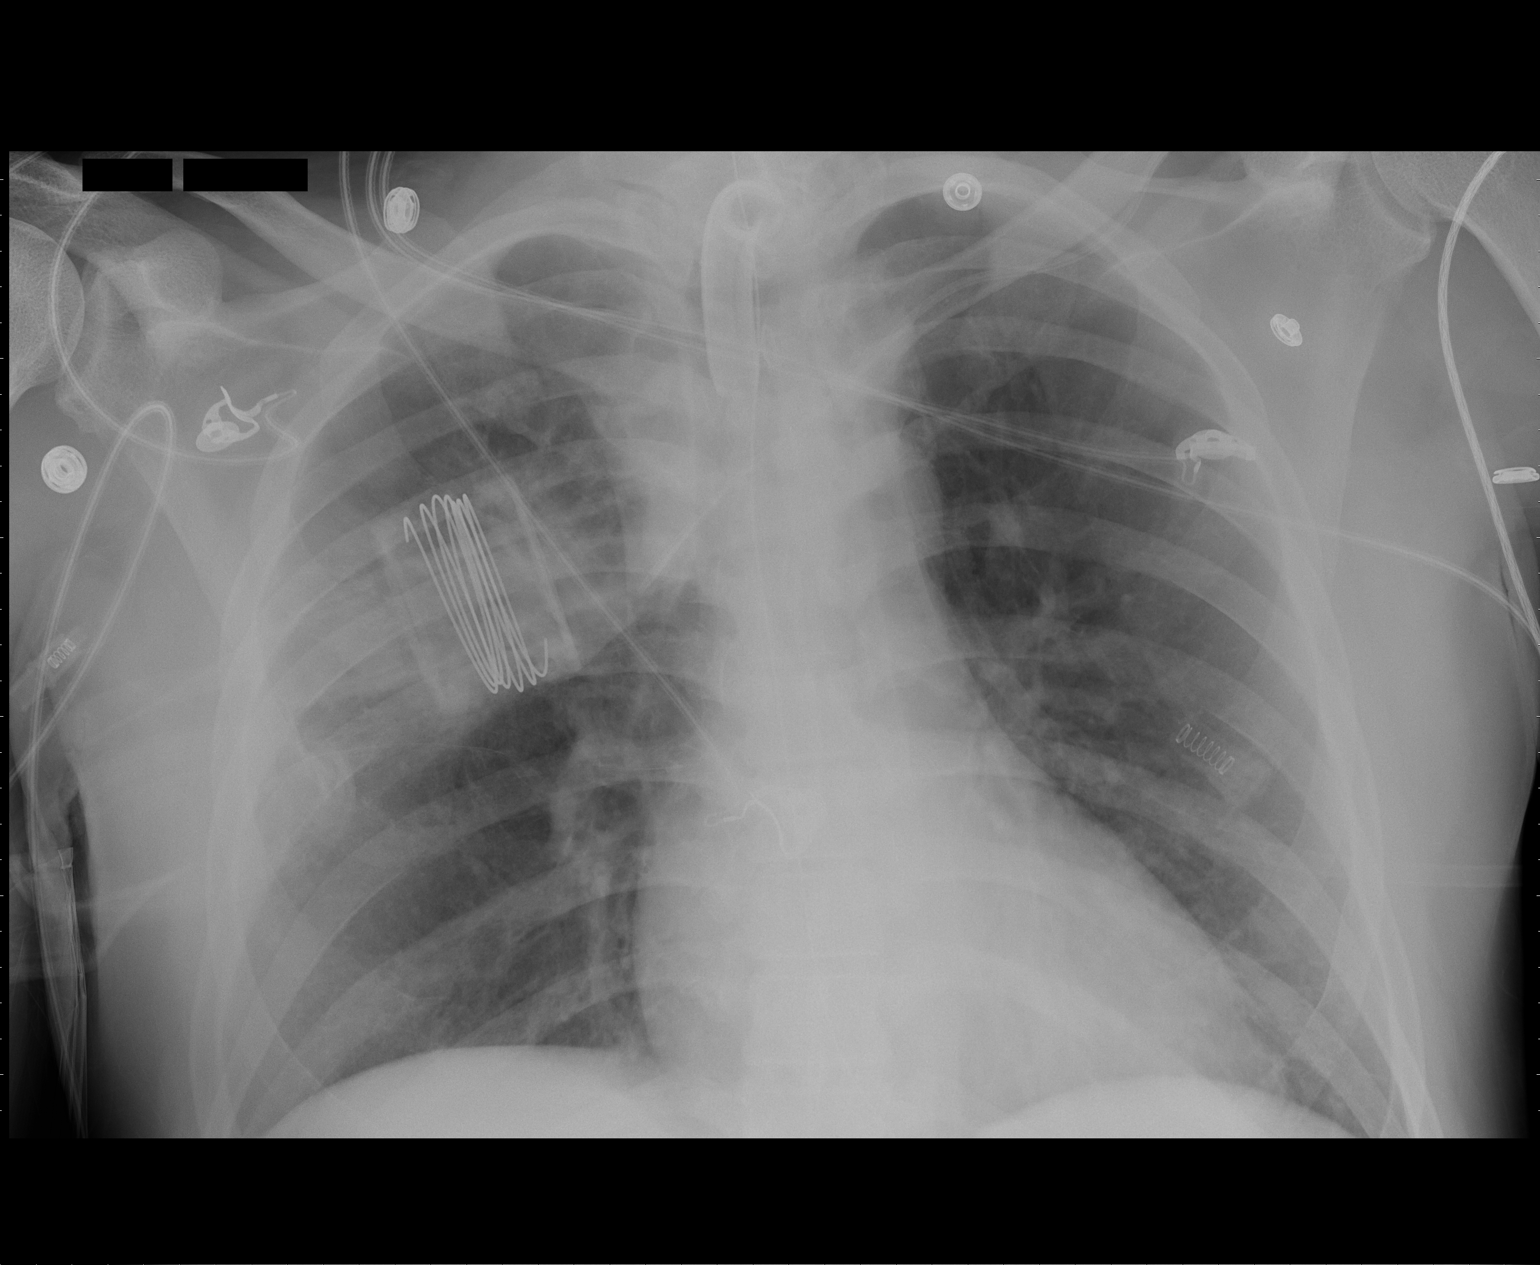

[1 of 1 positions shown; findings below may reference images not displayed]

PORTABLE CHEST - 1 VIEW:

6492 hours. Tracheostomy tube remains in place. NG tube passes into the stomach
although the distal tip is not visualized. Left subclavian central venous
catheter tip projects at the proximal SVC level near the the azygous confluence.

Heart size is within normal limits. There is some vascular congestion without
overt airspace pulmonary edema. Airspace disease seen previously has resolved in
the interval  . Opacity in the right midlung is presumably secondary to
overlying apparatus.
IMPRESSION: Interval improvement in lung aeration.

## 2008-12-29 IMAGING — CR DG CHEST 1V PORT
1 series · 1 of 1 positions shown · non-contrast
Comparison: 10/09/07.

CLINICAL DATA: Respiratory distress.  
 PORTABLE CHEST - 1 VIEW ? 10/10/07:

[view not recorded]
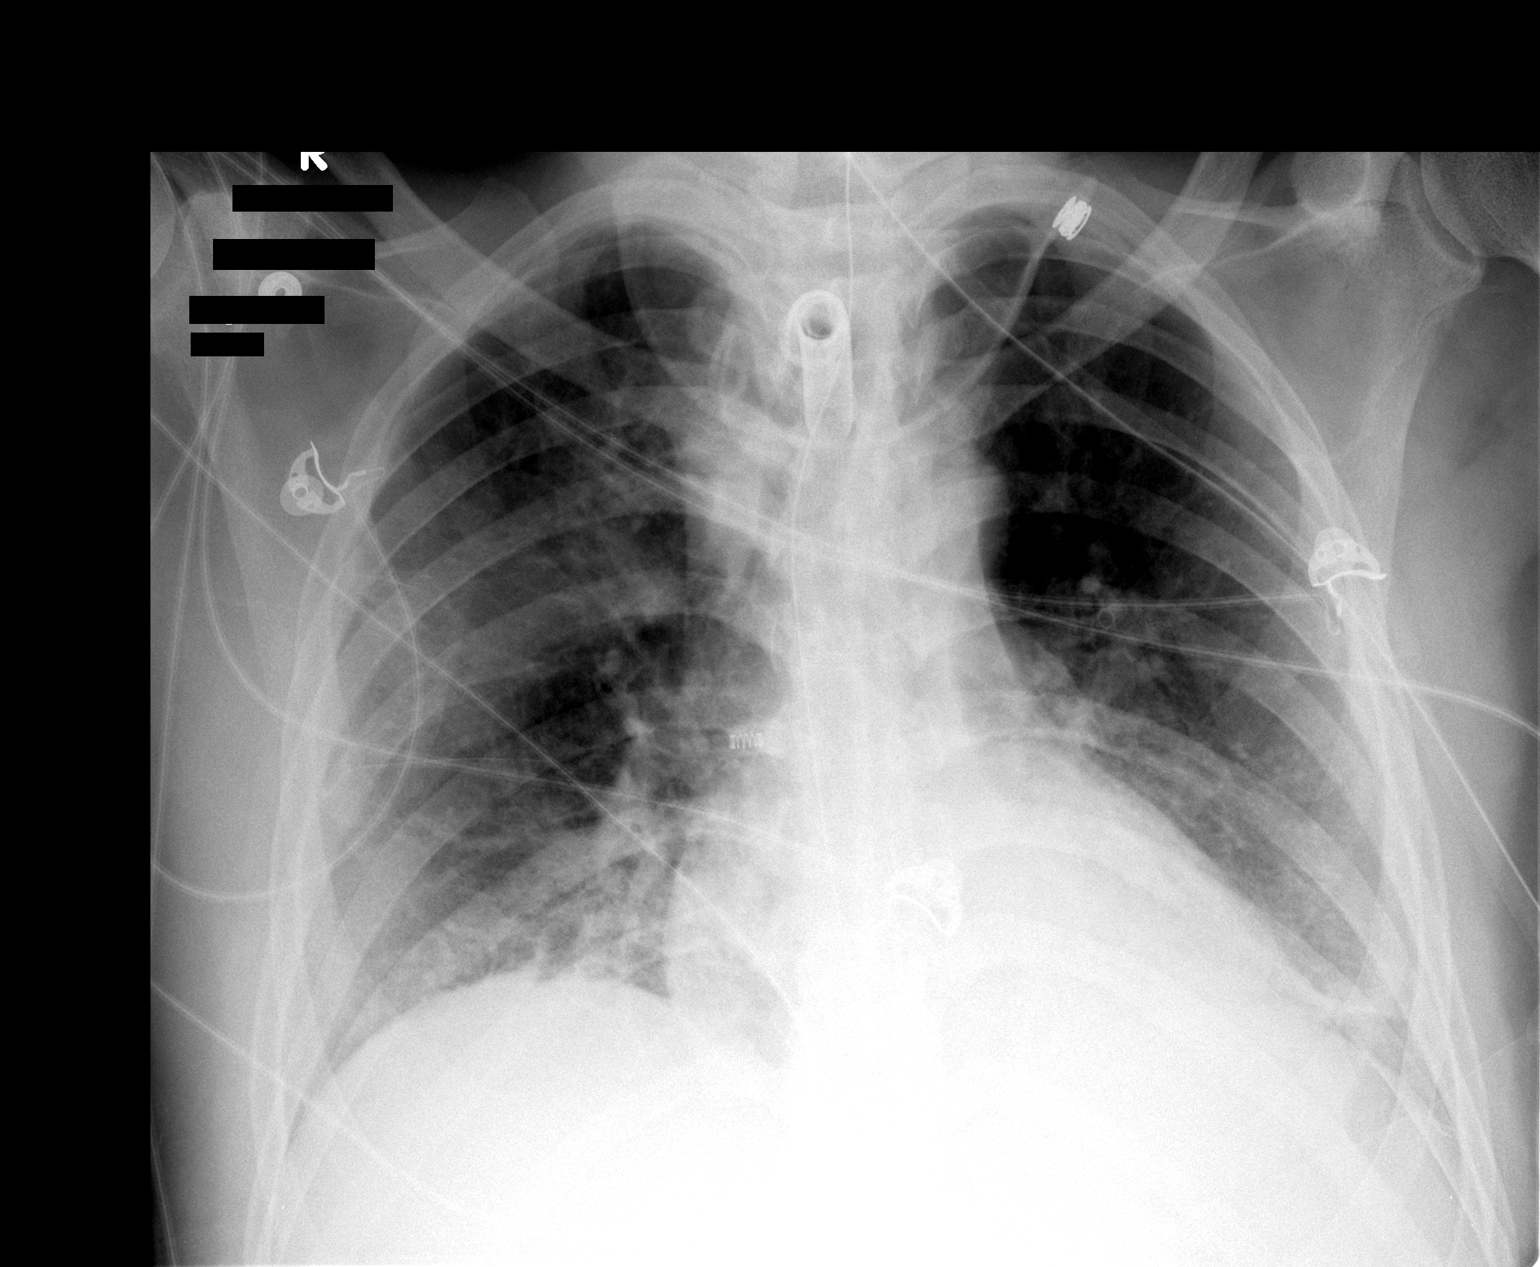

[1 of 1 positions shown; findings below may reference images not displayed]

FINDINGS: Tracheostomy is midline.  Nasogastric tube is followed into the stomach with the tip projecting beyond the inferior boundary of the film.   Bibasilar airspace disease is new from prior.  Opacity in the superior segment of the right lower lobe persists.
IMPRESSION: 1.  Persistent pneumonia in the superior segment of the right lower lobe. 
 2.  New bibasilar airspace disease may represent atelectasis and/or aspiration.

## 2008-12-30 ENCOUNTER — Telehealth: Payer: Self-pay | Admitting: Internal Medicine

## 2008-12-31 IMAGING — CR DG ABD PORTABLE 1V
1 series · 1 of 1 positions shown · non-contrast
Comparison: none

CLINICAL DATA: NG tube placement.
 PORTABLE ABDOMEN ? 1 VIEW ? 7313 HOURS:

[AP]
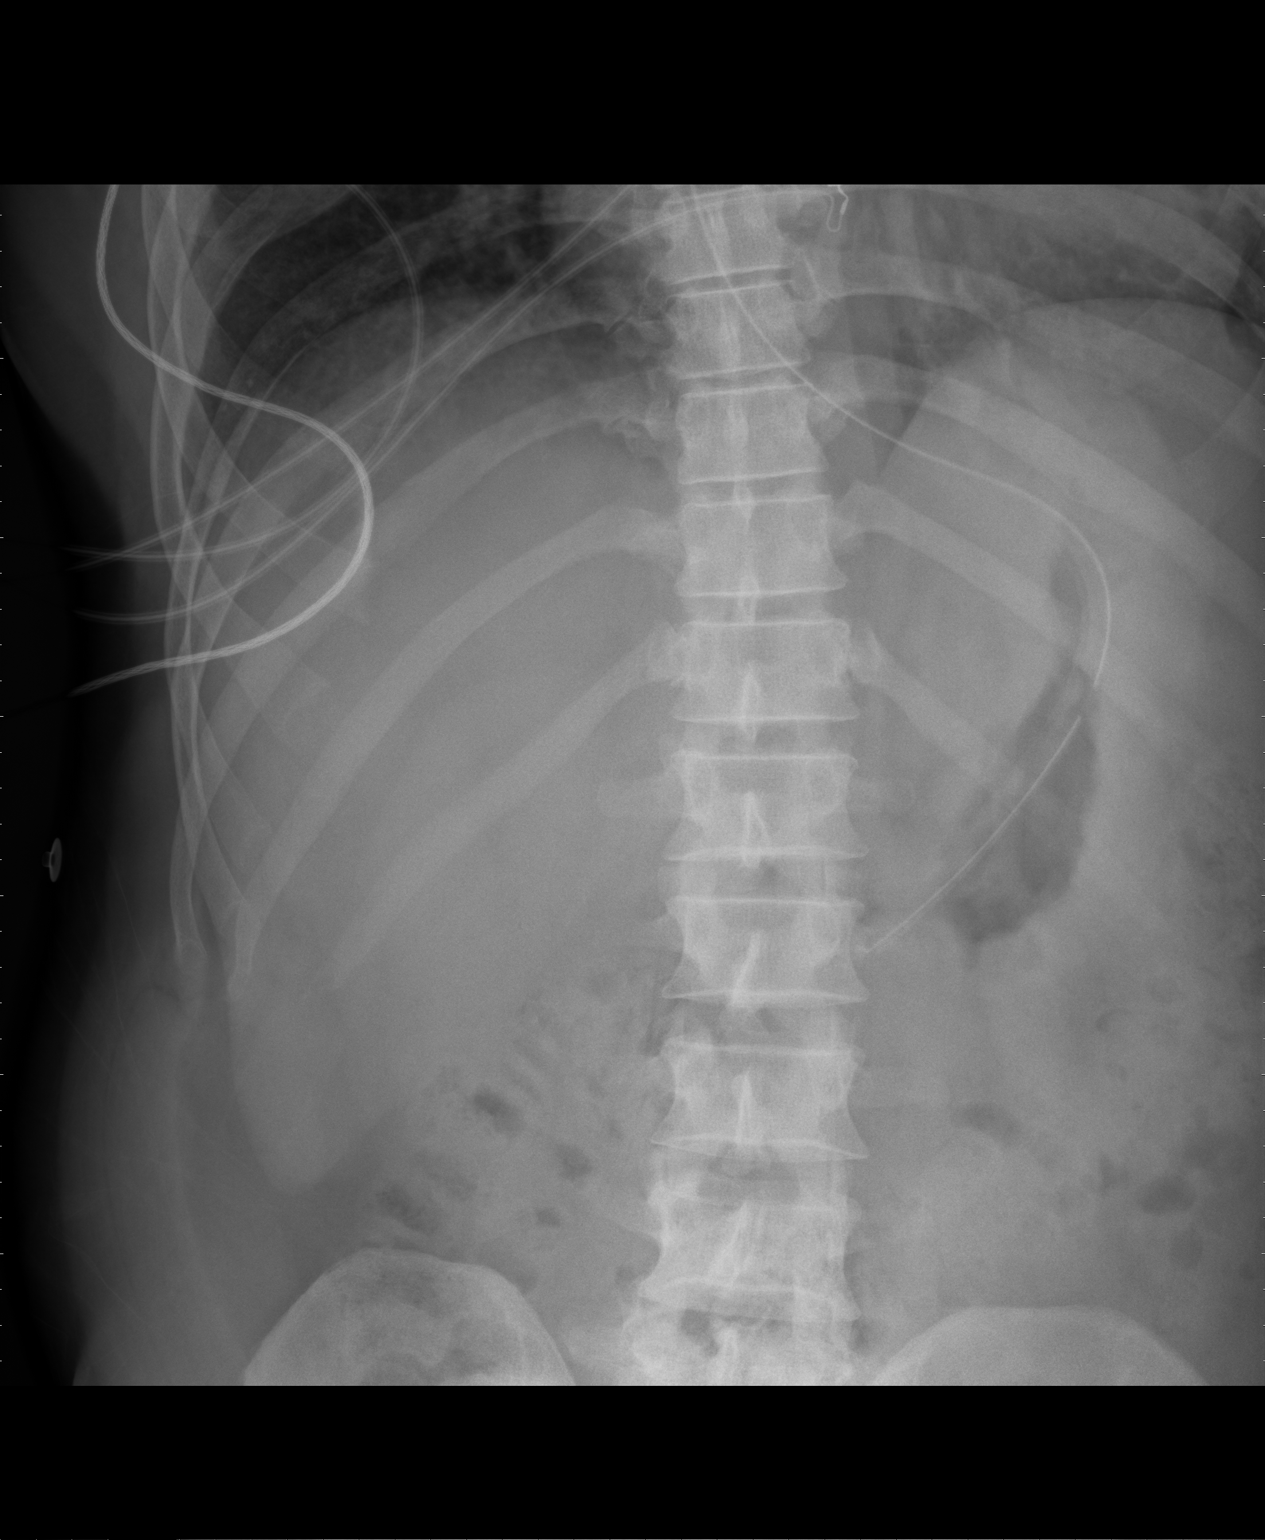

[1 of 1 positions shown; findings below may reference images not displayed]

FINDINGS: NG tube tip is in the antrum of the stomach.  The bowel gas pattern is nonspecific.
IMPRESSION: NG tube tip in distal antrum of the stomach.

## 2009-01-01 IMAGING — CR DG ABD PORTABLE 1V
1 series · 1 of 1 positions shown · non-contrast
Comparison: 10/12/2007

CLINICAL DATA: Panda tube placement. 
 PORTABLE ABDOMEN [DATE]/[id number] 2947 HOURS:

[view not recorded]
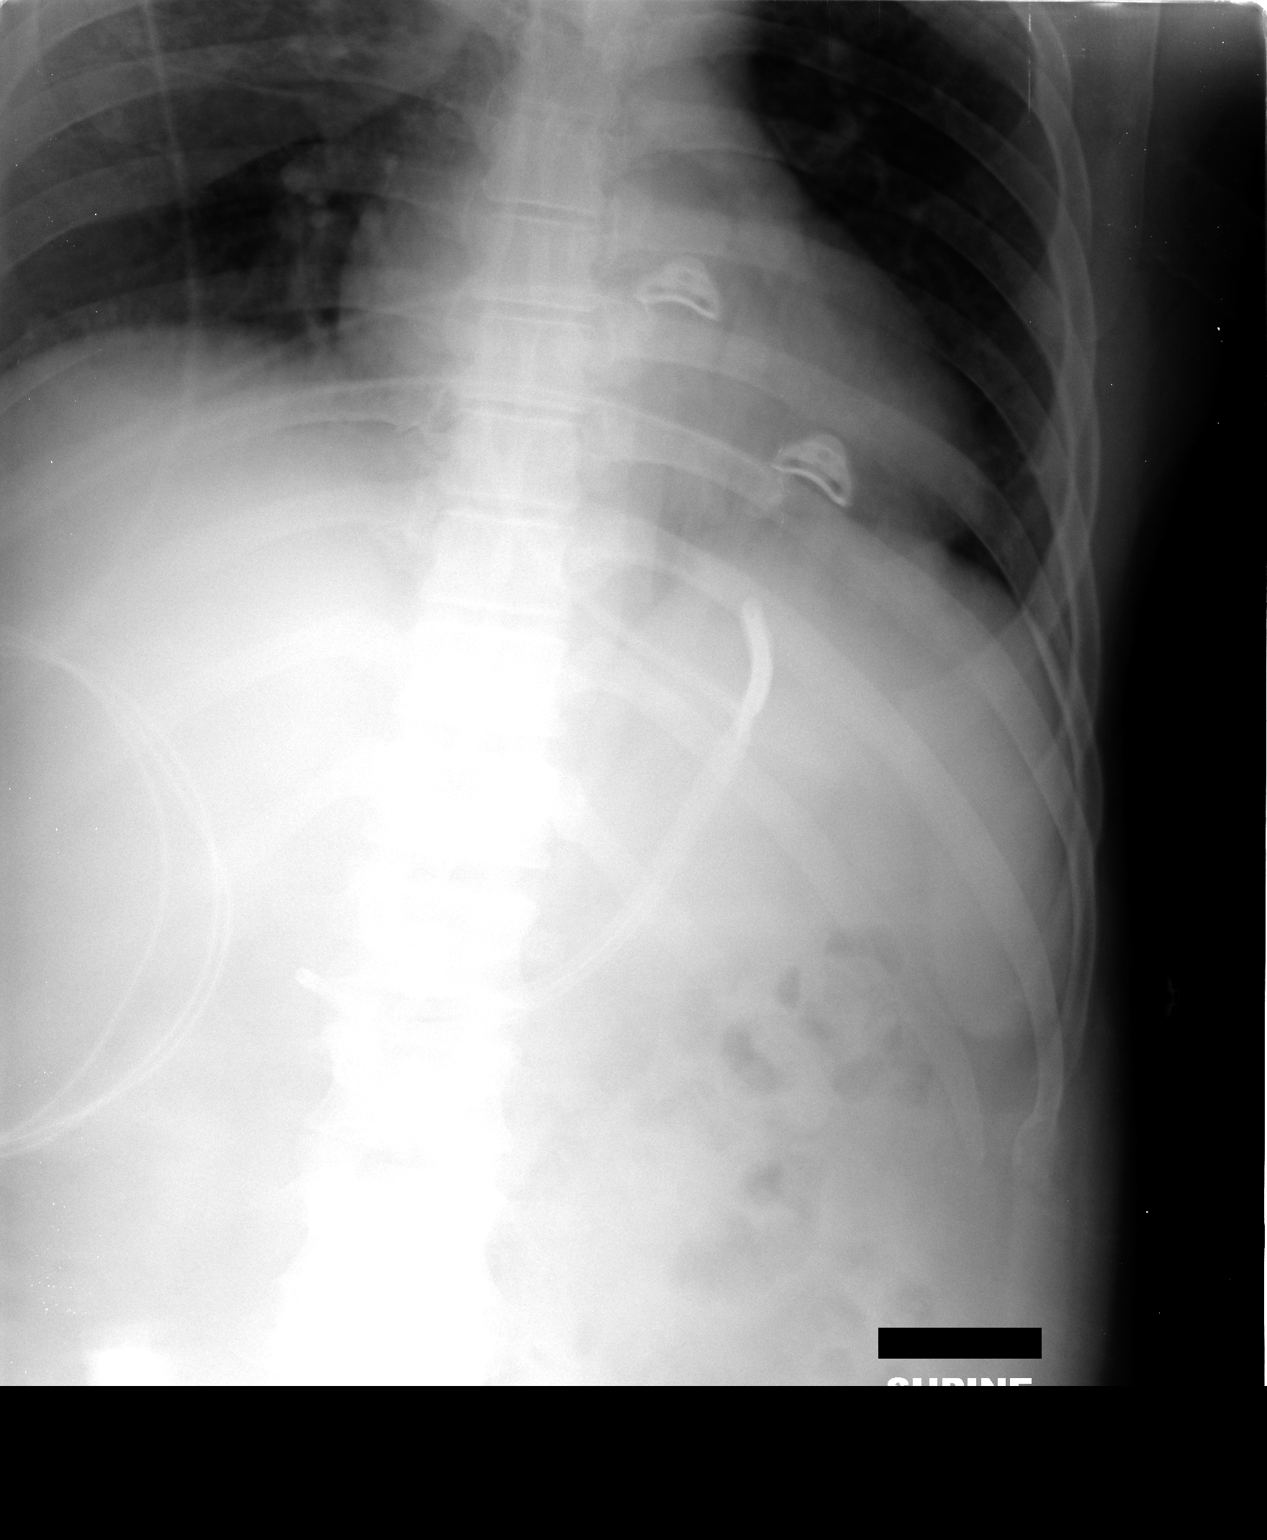

[1 of 1 positions shown; findings below may reference images not displayed]

FINDINGS: Nasogastric tube has been replaced by a Panda tube.  This enters the region of the gastric antrum, turns upon itself, and the tip is at the level of the gastric fundus.
IMPRESSION: Panda tube tip gastric fundus as noted above.

## 2009-01-02 IMAGING — CR DG CHEST 1V PORT
1 series · 1 of 1 positions shown · non-contrast
Comparison: [DATE].

CLINICAL DATA: Pneumonia.  Follow-up.
 PORTABLE CHEST - 1 VIEW, 10/14/07, 7922 HOURS:

[view not recorded]
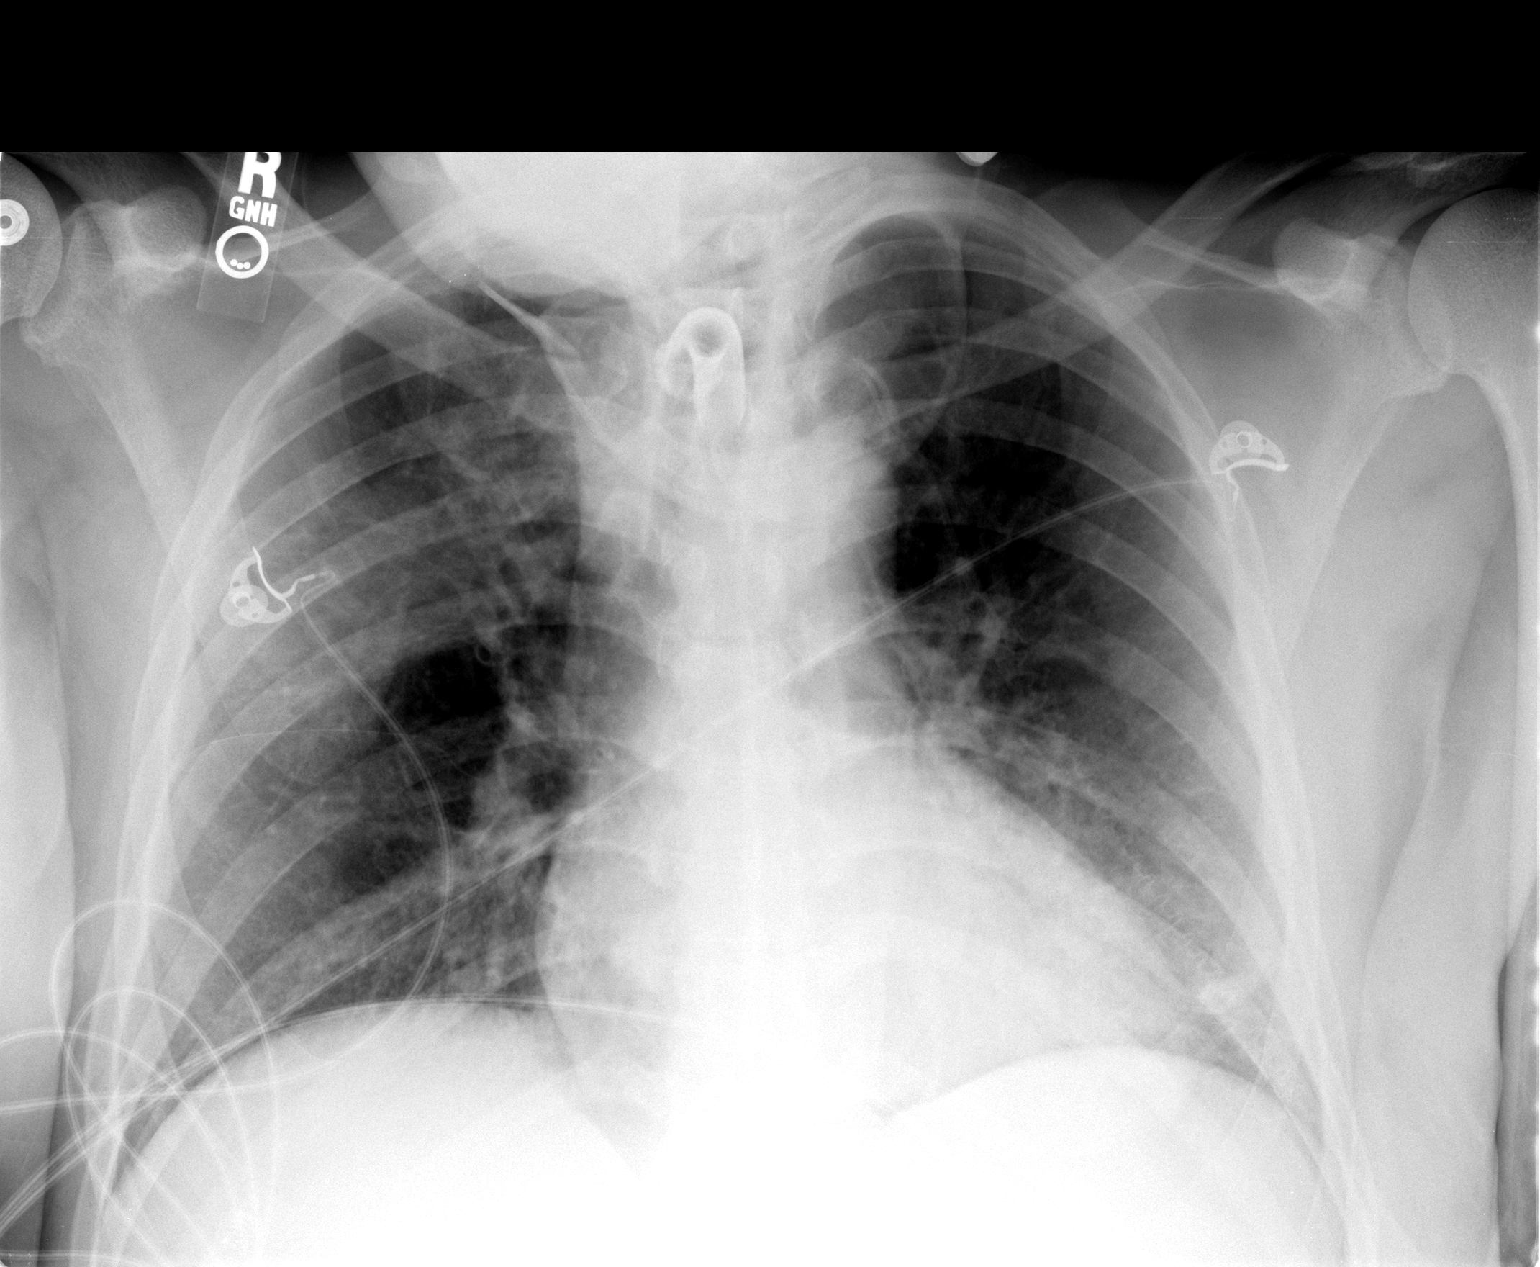

[1 of 1 positions shown; findings below may reference images not displayed]

FINDINGS: Patchy opacity remains in right mid lung suspicious for pneumonia or pleural based opacity.  A lateral view may be helpful.  The left lung is clear.  Cardiomegaly is stable.  Tracheostomy remains.  NG tube is no longer seen.
IMPRESSION: Minimal opacity remains in the right mid upper lung field most consistent with pneumonia when compared to a CT of 09/30/07.  The left lung is better aerated.

## 2009-01-02 IMAGING — CT CT CHEST W/O CM
2 of 4 series · 15 of 30 positions shown, 17 images · non-contrast
Comparison: NONE

CLINICAL DATA: Follow up parenchymal scarring versus resolving 
pneumonia  or developing neoplasm noted on prior study 11-30-07. 

CT CHEST WITHOUT INTRAVENOUS CONTRAST
TECHNIQUE: Multiple axial images were obtained from the lung 
apex through the upper abdomen.

[Series 2: chest wo · axial · 0.75mm/px · z∈[+524,+774]mm · 7 of 70 slices shown]
[im 10/70  lung]
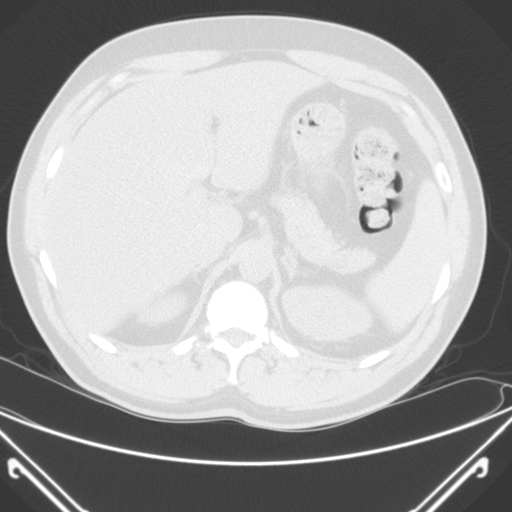
[im 20/70  lung]
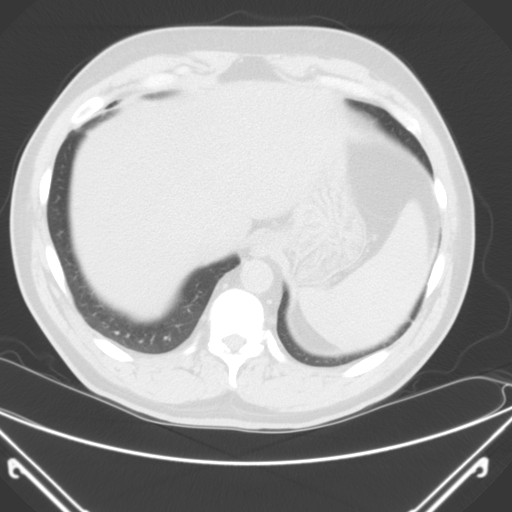
[im 30/70  lung]
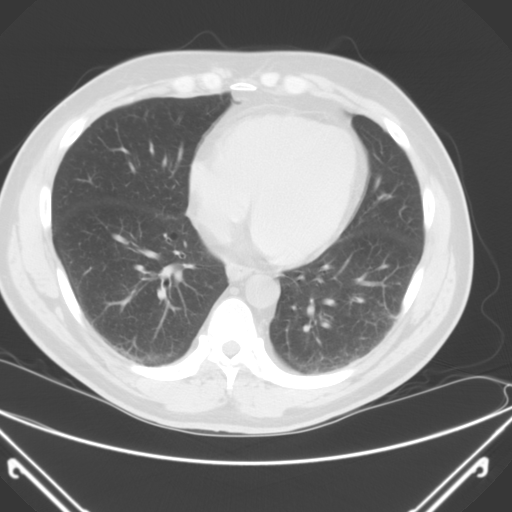
[im 34/70  lung]
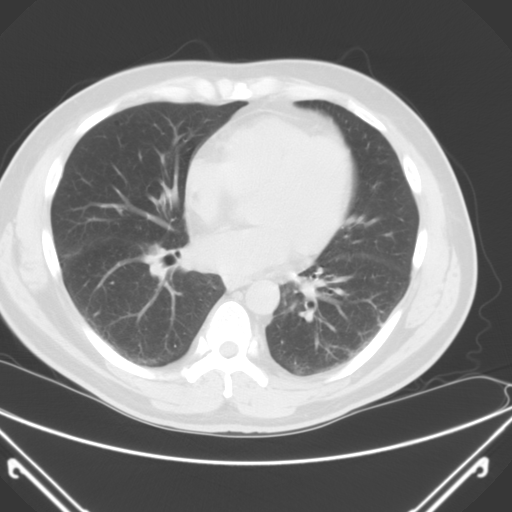
[im 40/70  lung]
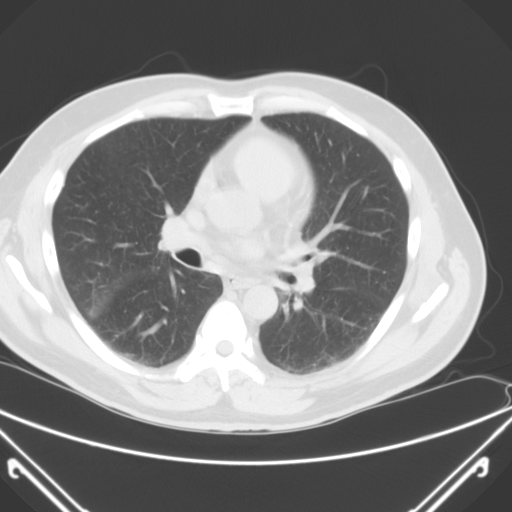
[im 50/70  lung]
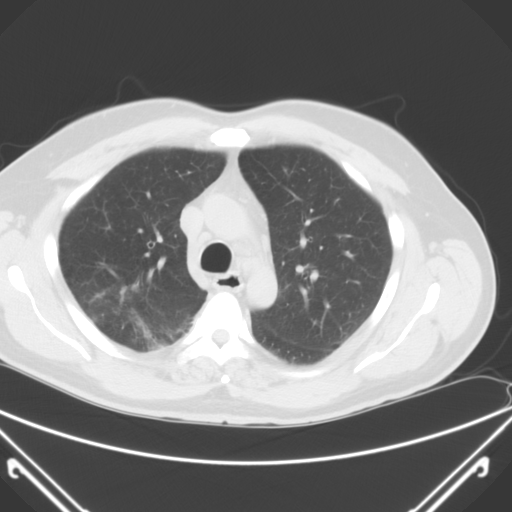
[im 60/70  lung]
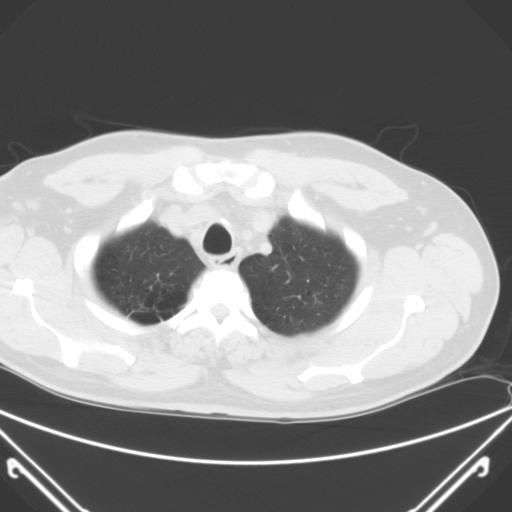

[Series 5: bone · axial · 0.75mm/px · z∈[+519,+779]mm · 8 of 70 slices shown, 10 images]
[im 9/70  mediastinal]
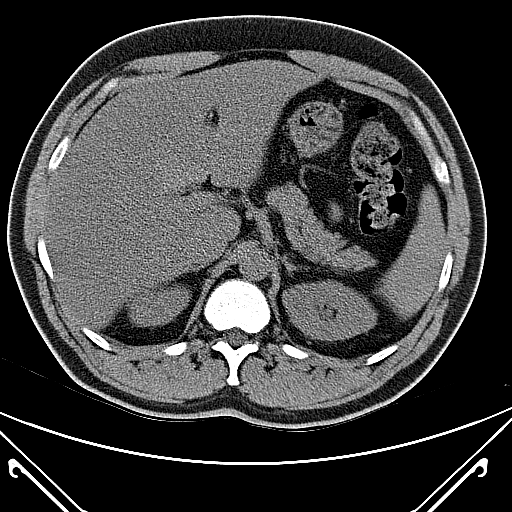
[im 9/70  lung]
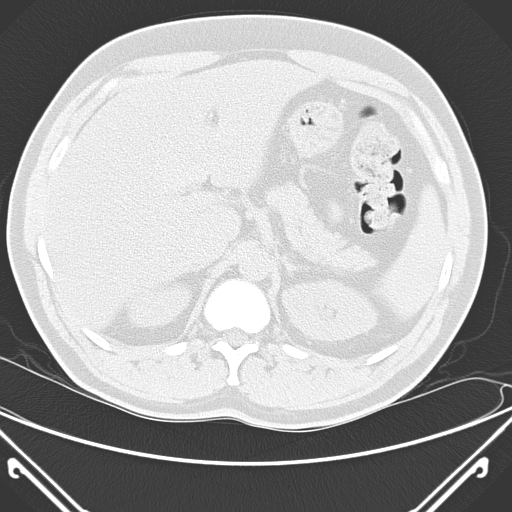
[im 18/70  lung]
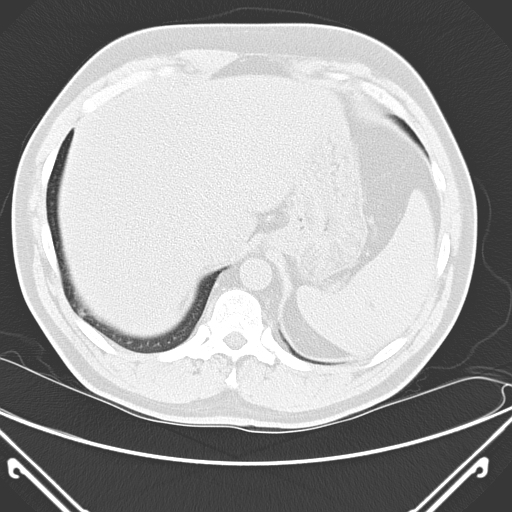
[im 26/70  lung]
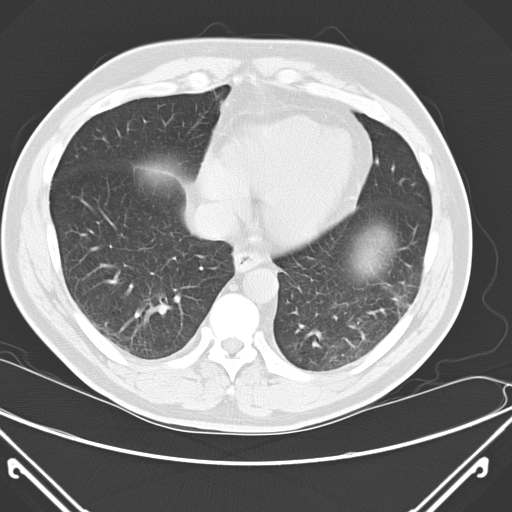
[im 34/70  lung]
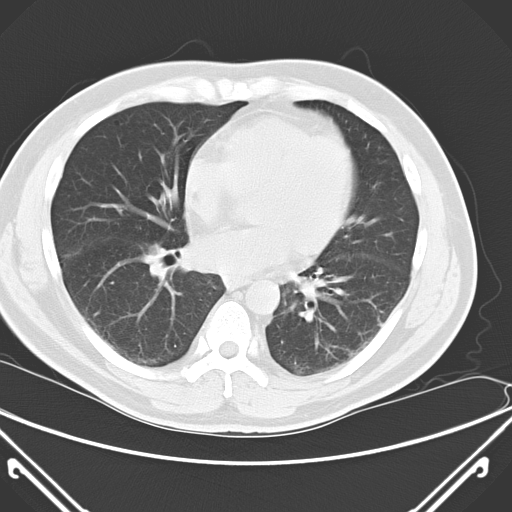
[im 35/70  mediastinal]
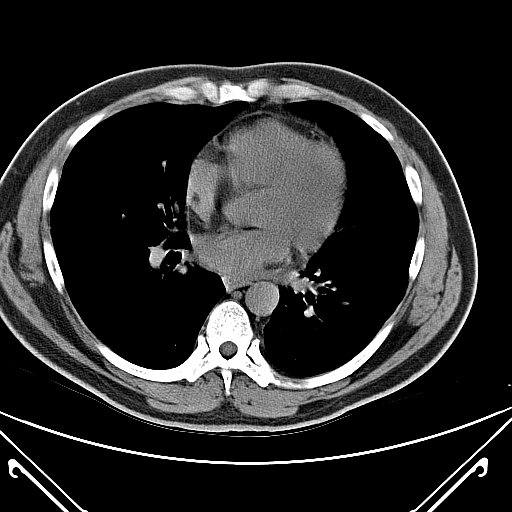
[im 35/70  lung]
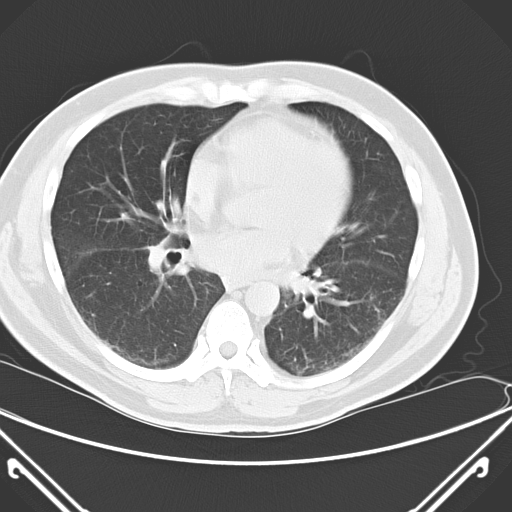
[im 44/70  lung]
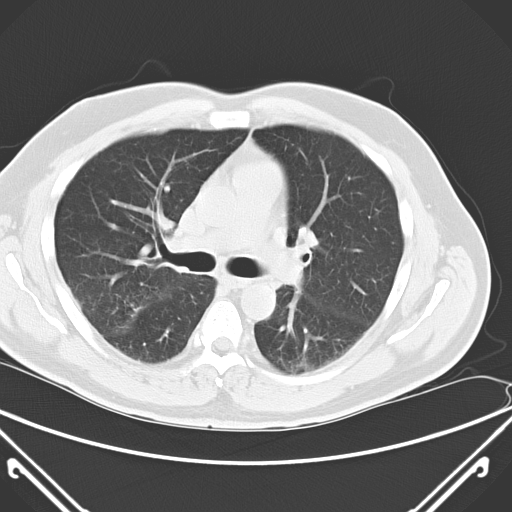
[im 52/70  lung]
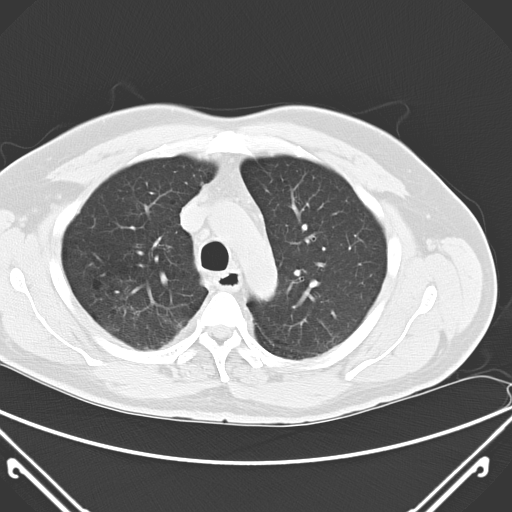
[im 61/70  lung]
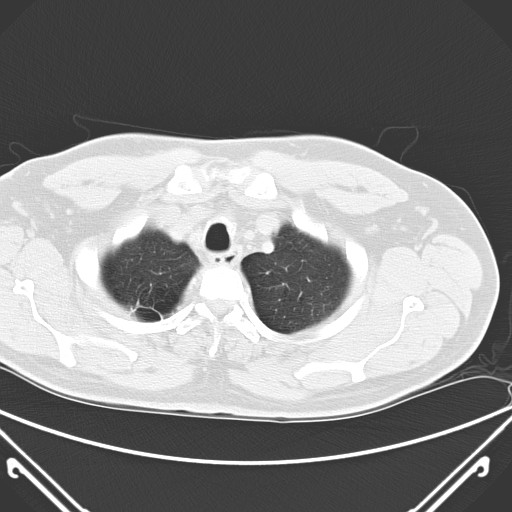

[15 of 30 positions shown; findings below may reference images not displayed]

FINDINGS: Emphysematous changes with bleb and bulla formation 
bilaterally are noted.  Area of parenchymal opacification noted 
previously persists, but is less conspicuous and most likely 
represents an area of pneumonia, which is nearly completely 
resolved with minimal areas of residual scarring or infiltrate. No 
mass, consolidation, infiltrate, edema, or effusion otherwise.  
Visualized portions of the upper abdominal structures are 
unremarkable.  No lytic or blastic lesions.
IMPRESSION: Emphysematous changes.  Resolving pneumonia in the 
posterior portion of the right upper lobe inferiorly.  Mild 
residual scarring is noted in this region.  No new or progressive 
disease process.  Recommend follow-up chest film in 6 months. 
Date: 02/15/2008  Tran Date:  02/15/2008 DAS  JLM

## 2009-01-03 ENCOUNTER — Encounter: Payer: Self-pay | Admitting: Endocrinology

## 2009-01-03 IMAGING — RF DG ABDOMEN 1V
1 series · 1 of 1 positions shown · non-contrast
Comparison: none

CLINICAL DATA: Feeding tube placement

ABDOMEN - 1 VIEW

[Series 2: run · 1 of 1 slices shown]
[im 1/1]
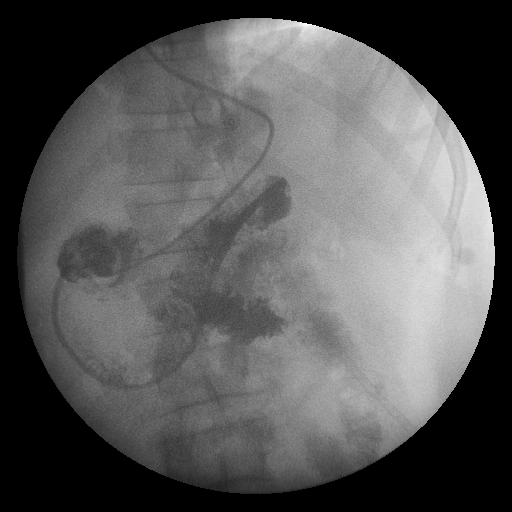

[1 of 1 positions shown; findings below may reference images not displayed]

FINDINGS: Feeding tube is in place with tip at the ligament of Treitz. Small
amount of contrast was injected there placement.
IMPRESSION: Feeding tube tip ligament of Treitz.

## 2009-01-04 IMAGING — CR DG ABD PORTABLE 1V
1 series · 1 of 1 positions shown · non-contrast
Comparison: 10/15/07

CLINICAL DATA: 46-year-old. Panda placement.
 ABDOMEN PORTABLE - 1 VIEW:

[view not recorded]
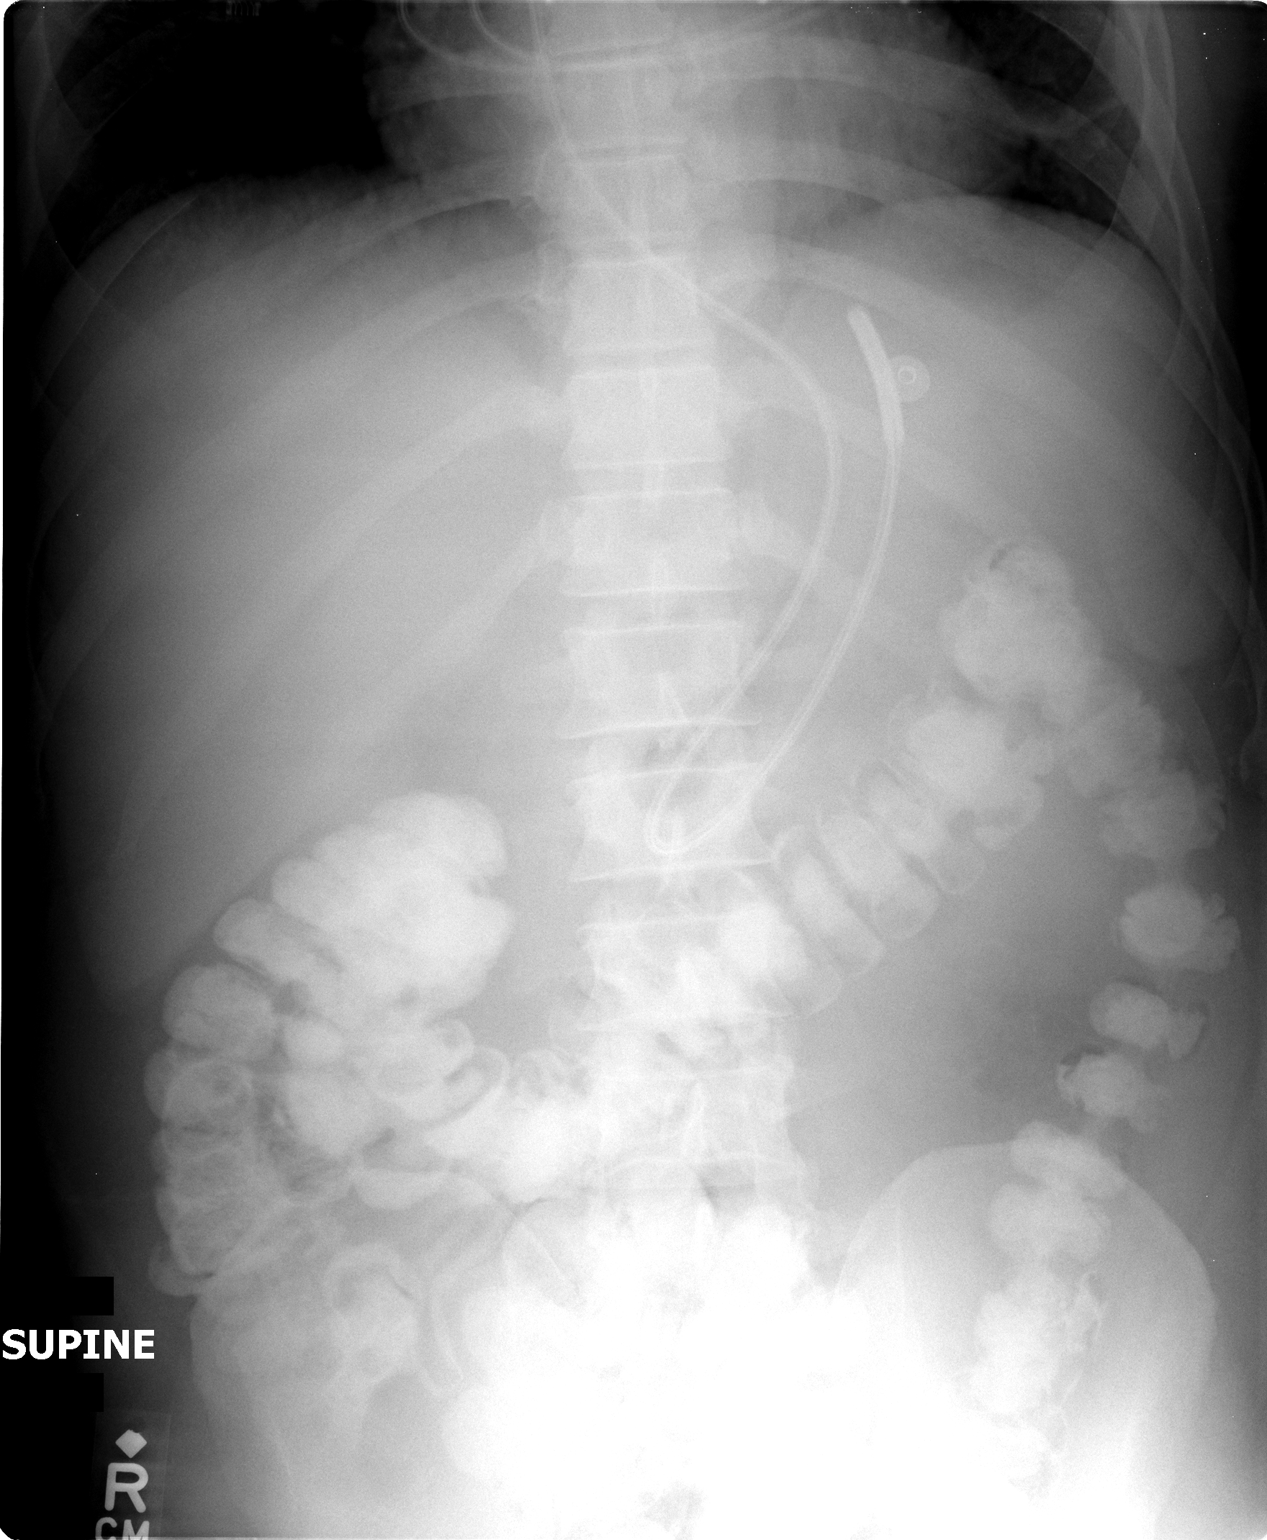

[1 of 1 positions shown; findings below may reference images not displayed]

FINDINGS: The Panda tube is coiled back on itself in the antral region of the stomach with its tip up in the fundus. Contrast is noted throughout the colon.
IMPRESSION: Panda tube coiled back on itself with its tip in the fundus of the stomach.

## 2009-01-04 IMAGING — CR DG ABD PORTABLE 1V
1 series · 1 of 1 positions shown · non-contrast
Comparison: none

CLINICAL DATA: Post Panda tube placement. 
 PORTABLE ABDOMEN - 1 VIEW - 10/16/07 AT 0176 HOURS:

[view not recorded]
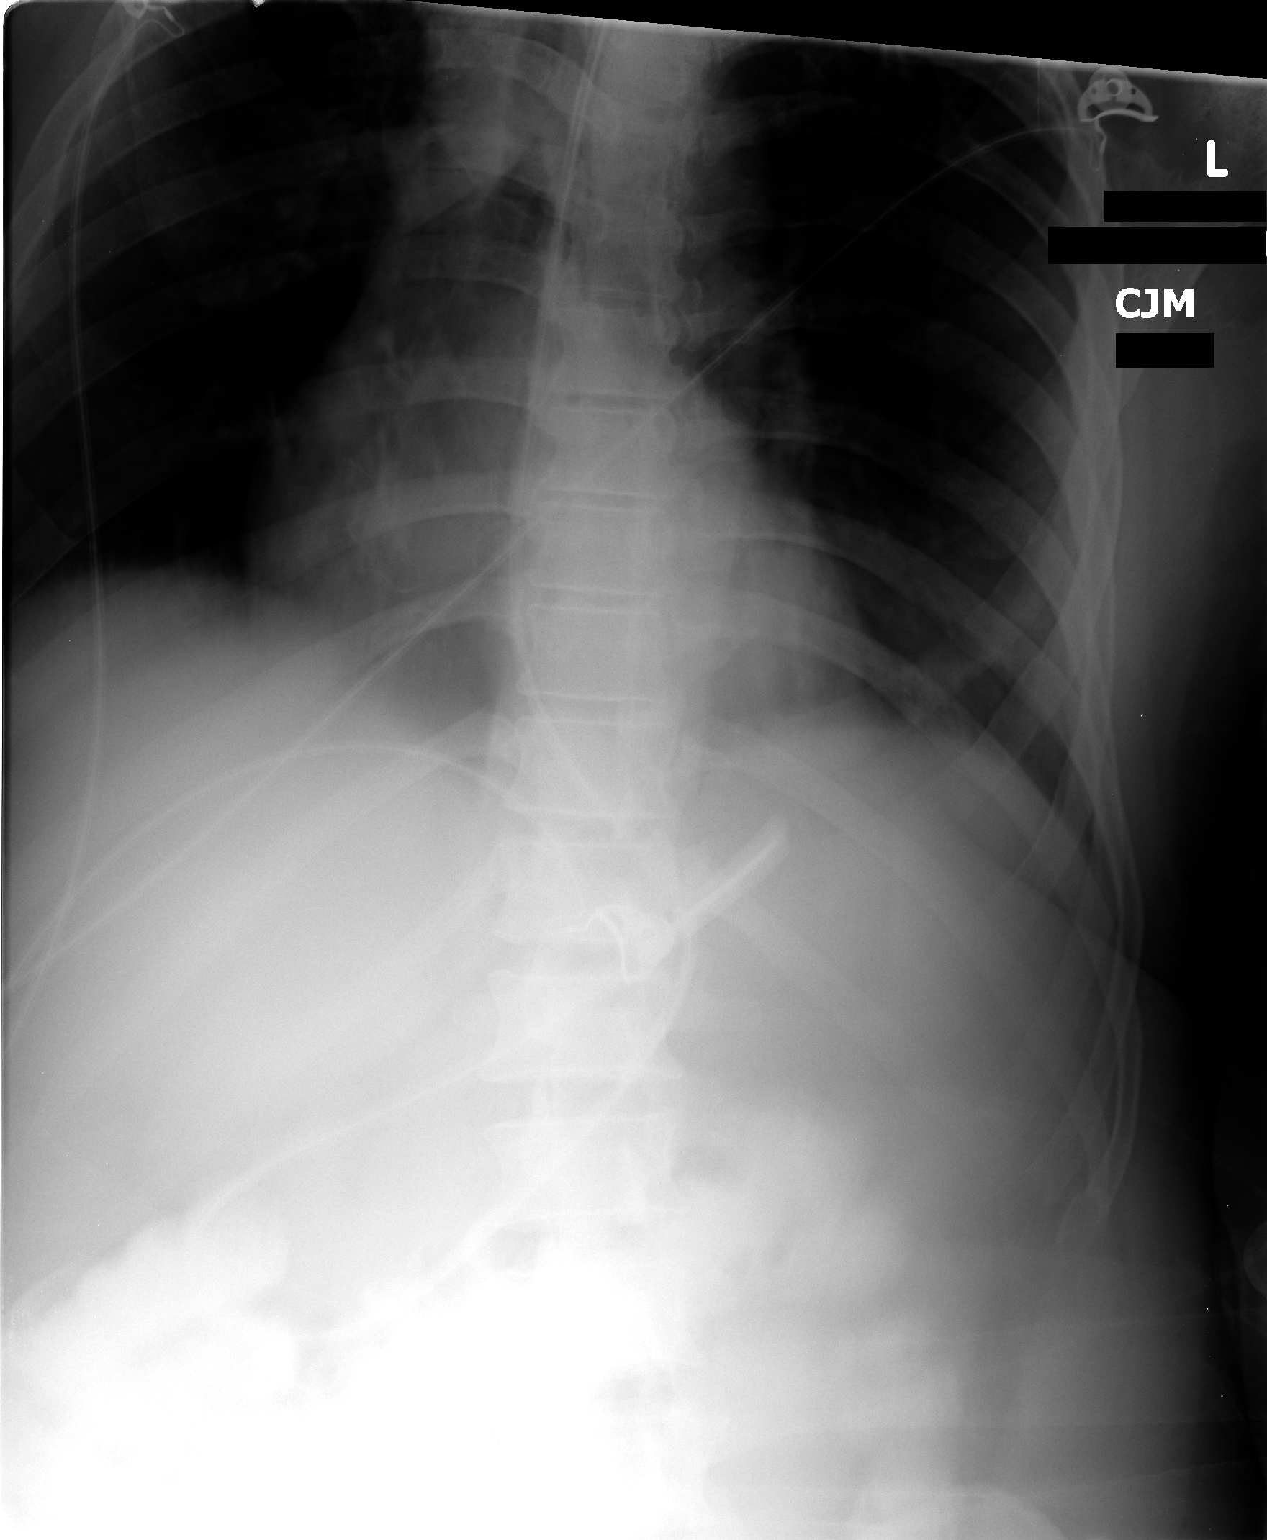

[1 of 1 positions shown; findings below may reference images not displayed]

FINDINGS: Panda tube loops back on itself in the distal antrum of the stomach.  The tip is located in the gastric fundus.
IMPRESSION: Suboptimal position of Panda tube.

## 2009-01-04 IMAGING — CR DG ABD PORTABLE 1V
1 series · 1 of 1 positions shown · non-contrast
Comparison: 7365 hours the same day.

CLINICAL DATA: 46-year-old male status post panda tube placement.  
PORTABLE AP SUPINE ABDOMEN ? 1 VIEW 10/16/07 AT 8212 HOURS:

[view not recorded]
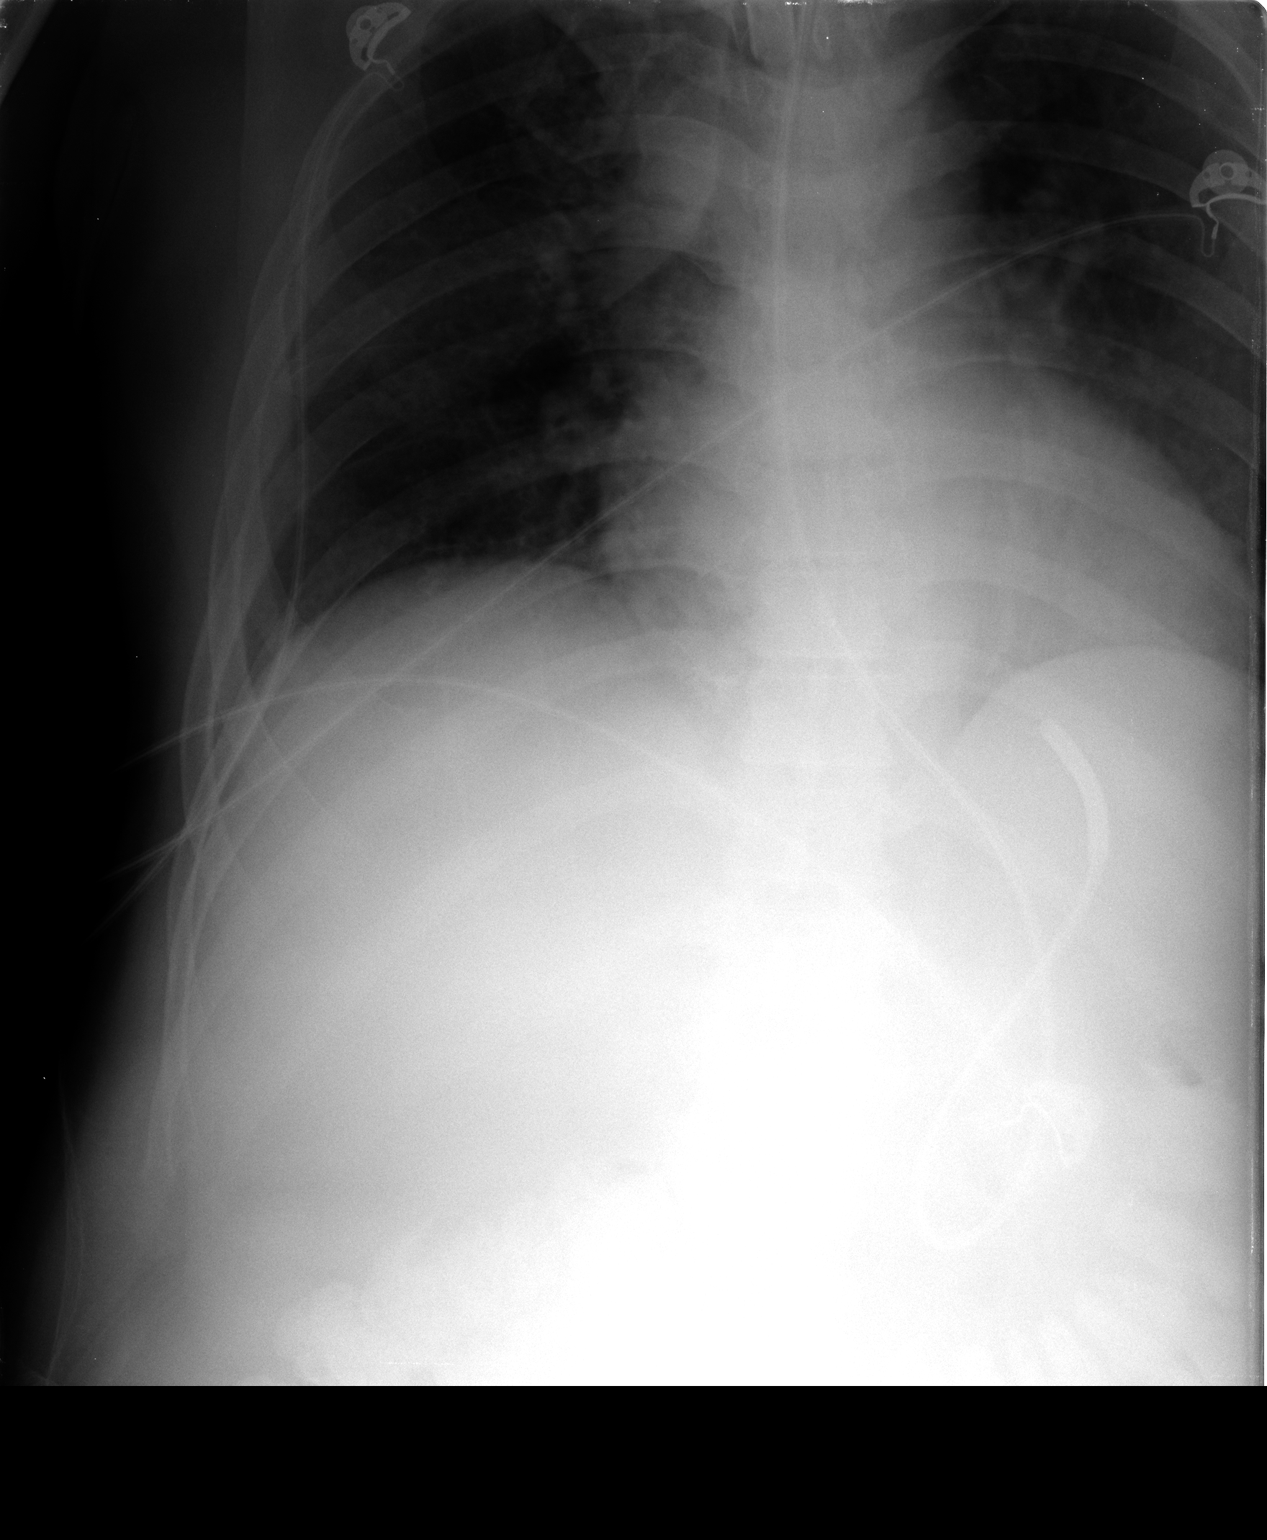

[1 of 1 positions shown; findings below may reference images not displayed]

FINDINGS: Portable AP supine view at 8212 hours.  Enteric feeding tube remains in place, the tip is at the stomach fundus.  There is probably adequate slack for transit to the duodenum.  There is retained contrast in nondilated bowel loops.
IMPRESSION: Stable feeding tube looped in the stomach probably with adequate slack to transit beyond the pylorus.

## 2009-01-06 ENCOUNTER — Telehealth: Payer: Self-pay | Admitting: Internal Medicine

## 2009-01-07 ENCOUNTER — Ambulatory Visit (HOSPITAL_COMMUNITY): Payer: Self-pay | Admitting: Licensed Clinical Social Worker

## 2009-01-07 IMAGING — CR DG CHEST 1V PORT
1 series · 1 of 1 positions shown · non-contrast
Comparison: 10/10/07 and 10/14/07.

CLINICAL DATA: Pneumonia.  Renal insufficiency.  
 PORTABLE CHEST - 1 VIEW - 10/19/07 AT 7375 HOURS:

[AP]
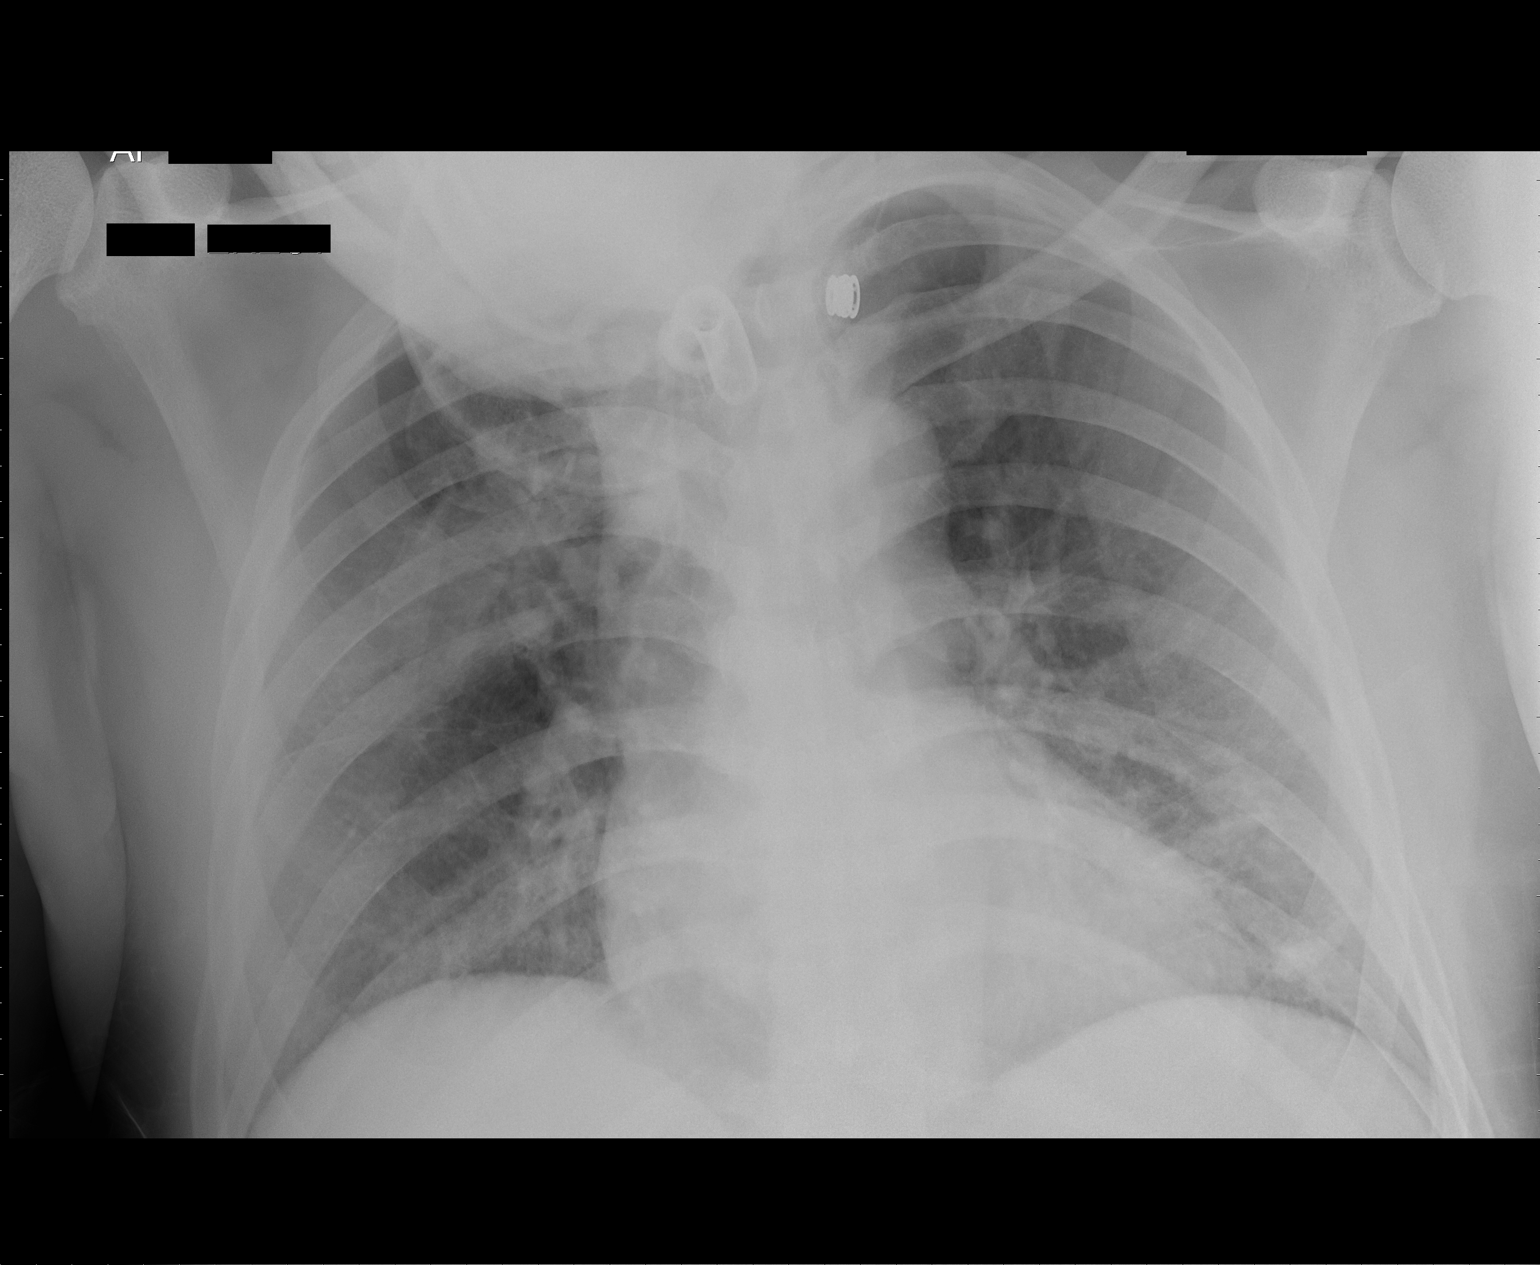

[1 of 1 positions shown; findings below may reference images not displayed]

FINDINGS: The tracheostomy is in stable position.  The patient is mildly rotated to the right.  The heart size and mediastinal contours are stable.  Right upper lobe air space disease is unchanged.  Patchy bibasilar opacities have slightly worsened. There is no pleural effusion or pneumothorax.
IMPRESSION: Stable right upper lobe air space disease with interval worsening of patchy bibasilar opacities.  Otherwise stable examination.

## 2009-01-07 IMAGING — RF DG ABDOMEN 1V
3 series · 3 of 3 positions shown · non-contrast
Comparison: none

CLINICAL DATA: Panda tube placement.
 ABDOMEN- 1 VIEW:

[Series 1: run · 1 of 1 slices shown (1 of 3)]
[im 1/1]
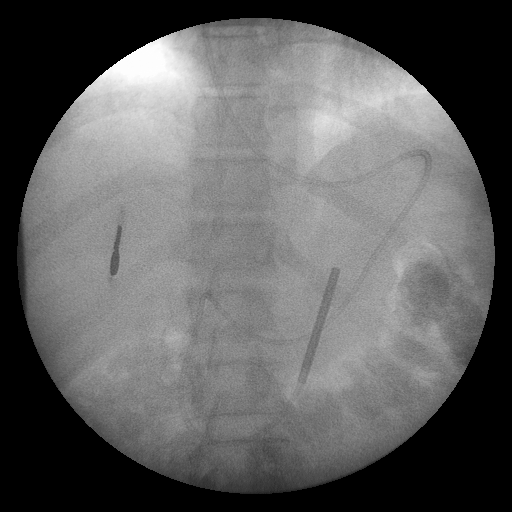

[Series 2: run · 1 of 1 slices shown (2 of 3)]
[im 1/1]
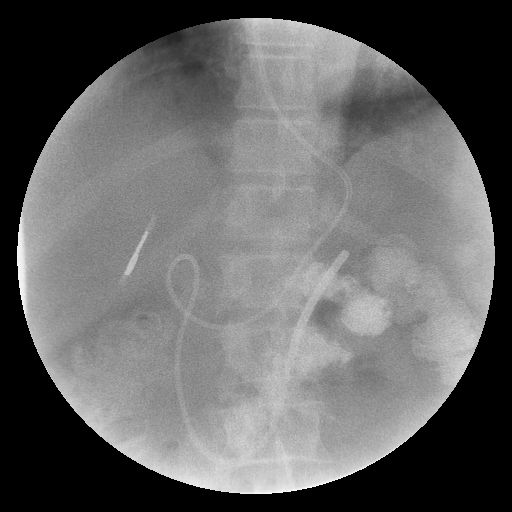

[Series 3: run · 1 of 1 slices shown (3 of 3)]
[im 1/1]
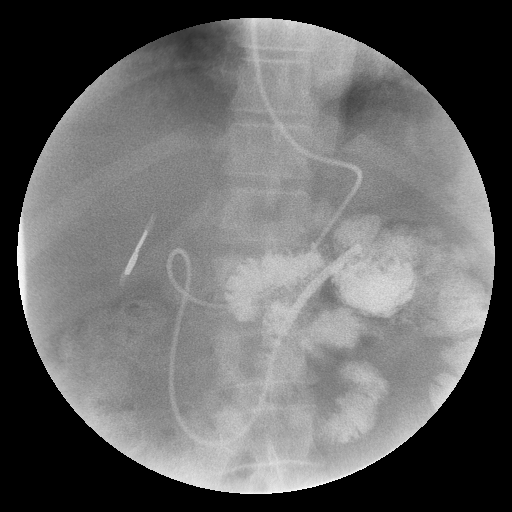

[3 of 3 positions shown; findings below may reference images not displayed]

FINDINGS: Spot images were obtained after the patient?s feeding tube placement by the Radiology Technologist. These demonstrate the tip of the tube at the ligament of Treitz.
IMPRESSION: Feeding tube tip is at the ligament of Treitz.

## 2009-01-08 ENCOUNTER — Ambulatory Visit: Payer: Self-pay | Admitting: Internal Medicine

## 2009-01-15 ENCOUNTER — Ambulatory Visit (HOSPITAL_COMMUNITY): Payer: Self-pay | Admitting: Licensed Clinical Social Worker

## 2009-01-20 ENCOUNTER — Ambulatory Visit (HOSPITAL_COMMUNITY): Payer: Self-pay | Admitting: Licensed Clinical Social Worker

## 2009-01-21 ENCOUNTER — Ambulatory Visit (HOSPITAL_COMMUNITY): Payer: Self-pay | Admitting: Psychiatry

## 2009-01-22 ENCOUNTER — Encounter: Payer: Self-pay | Admitting: Internal Medicine

## 2009-01-22 ENCOUNTER — Ambulatory Visit: Payer: Self-pay | Admitting: Internal Medicine

## 2009-01-22 LAB — HM COLONOSCOPY

## 2009-01-26 ENCOUNTER — Encounter: Payer: Self-pay | Admitting: Internal Medicine

## 2009-01-28 ENCOUNTER — Ambulatory Visit: Payer: Self-pay | Admitting: Endocrinology

## 2009-02-06 ENCOUNTER — Telehealth: Payer: Self-pay | Admitting: Internal Medicine

## 2009-02-10 ENCOUNTER — Ambulatory Visit (HOSPITAL_COMMUNITY): Payer: Self-pay | Admitting: Licensed Clinical Social Worker

## 2009-02-25 ENCOUNTER — Ambulatory Visit (HOSPITAL_COMMUNITY): Payer: Self-pay | Admitting: Licensed Clinical Social Worker

## 2009-03-03 ENCOUNTER — Ambulatory Visit: Payer: Self-pay | Admitting: Internal Medicine

## 2009-03-04 ENCOUNTER — Ambulatory Visit (HOSPITAL_COMMUNITY): Payer: Self-pay | Admitting: Licensed Clinical Social Worker

## 2009-03-05 ENCOUNTER — Telehealth (INDEPENDENT_AMBULATORY_CARE_PROVIDER_SITE_OTHER): Payer: Self-pay | Admitting: *Deleted

## 2009-03-09 ENCOUNTER — Ambulatory Visit: Payer: Self-pay | Admitting: Internal Medicine

## 2009-03-09 ENCOUNTER — Telehealth: Payer: Self-pay | Admitting: Internal Medicine

## 2009-03-11 ENCOUNTER — Telehealth: Payer: Self-pay | Admitting: Internal Medicine

## 2009-03-18 ENCOUNTER — Ambulatory Visit (HOSPITAL_COMMUNITY): Payer: Self-pay | Admitting: Licensed Clinical Social Worker

## 2009-03-23 IMAGING — CT CT ANGIO CHEST
2 of 5 series · 19 of 36 positions shown · IV contrast (100 ML OMNI 300)
Comparison: 09/30/2007.

CLINICAL DATA: Chest pain, shoulder and back pain, shortness of
breath.  Elevated D-dimer.

CT ANGIOGRAPHY CHEST
TECHNIQUE: Multidetector CT imaging of the chest using the
standard protocol during bolus administration of intravenous
contrast. Multiplanar reconstructed images obtained and reviewed to
evaluate the vascular anatomy.
Contrast: 100 ccs of Gmnipaque-Y00.

[Series 3: pe · axial · 0.70mm/px · z∈[-310,-66]mm · 16 of 221 slices shown]
[im 13/221  lung]
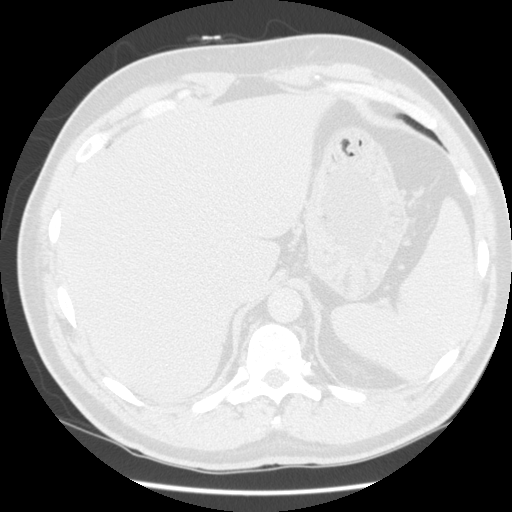
[im 26/221  mediastinal]
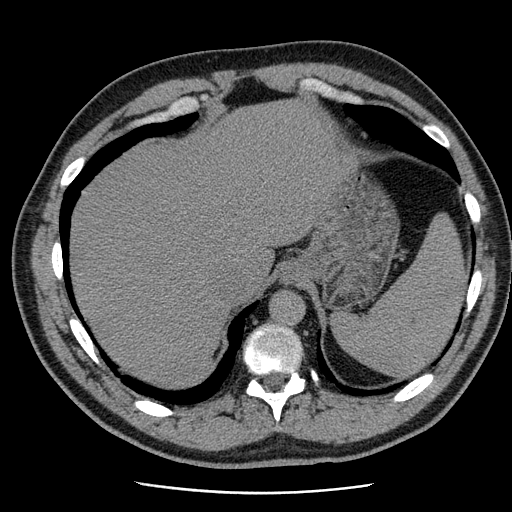
[im 39/221  lung]
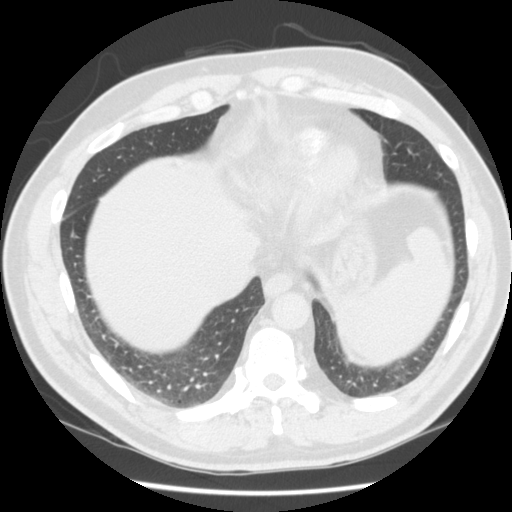
[im 52/221  mediastinal]
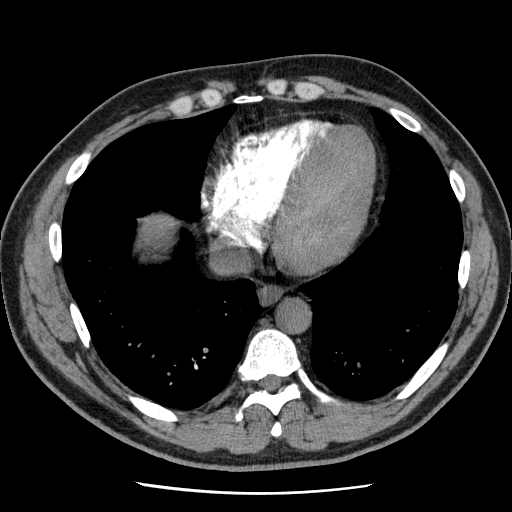
[im 65/221  lung]
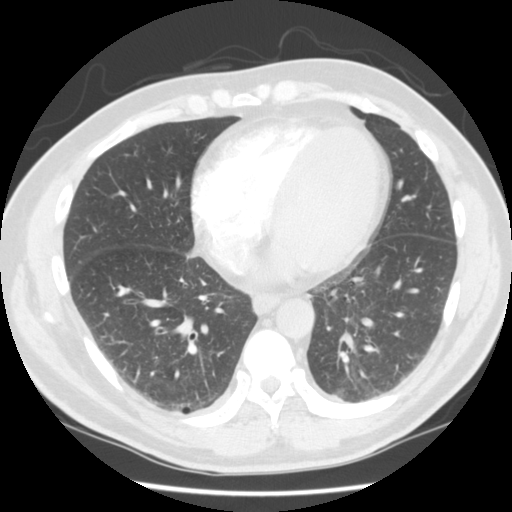
[im 78/221  mediastinal]
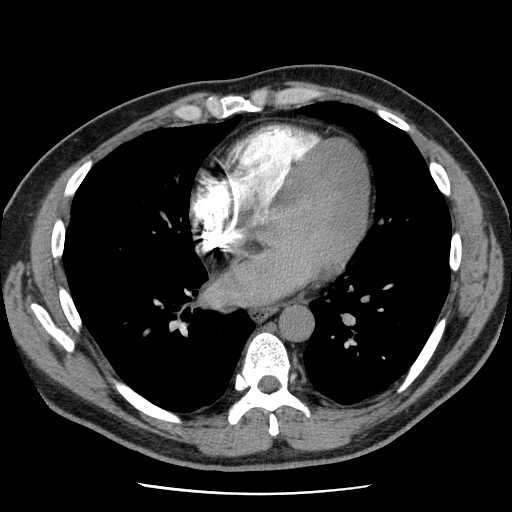
[im 91/221  lung]
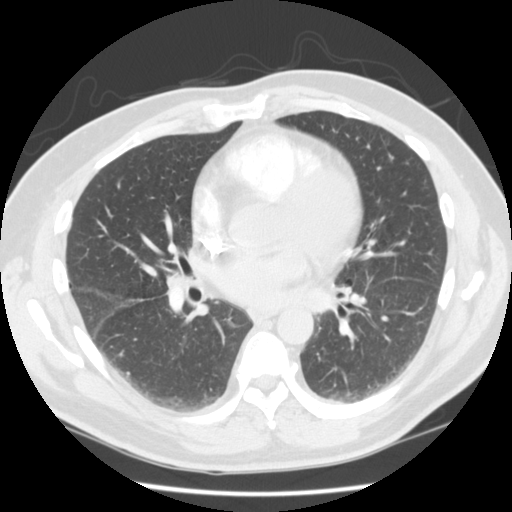
[im 104/221  mediastinal]
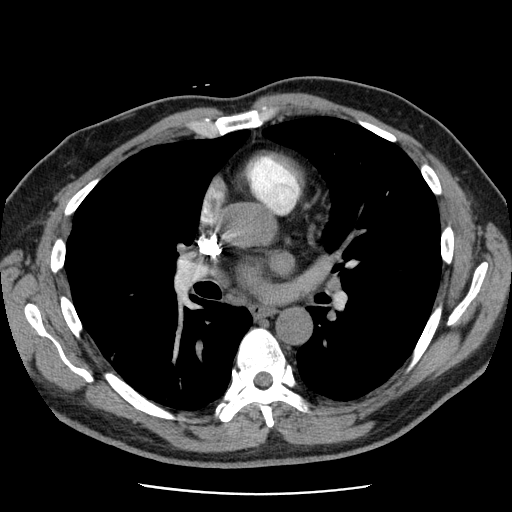
[im 117/221  lung]
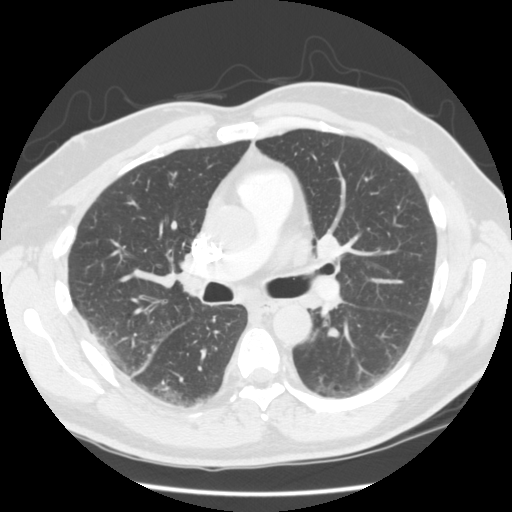
[im 130/221  mediastinal]
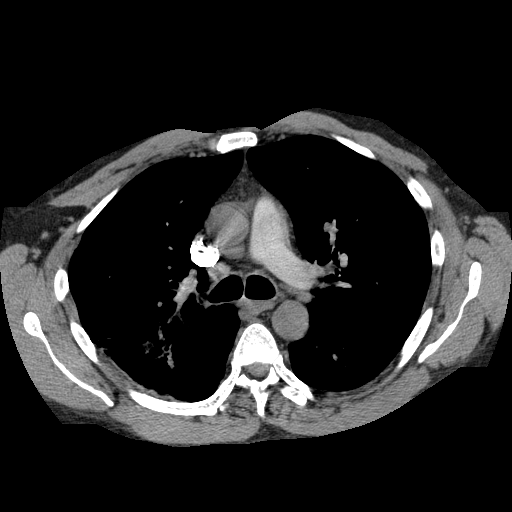
[im 143/221  lung]
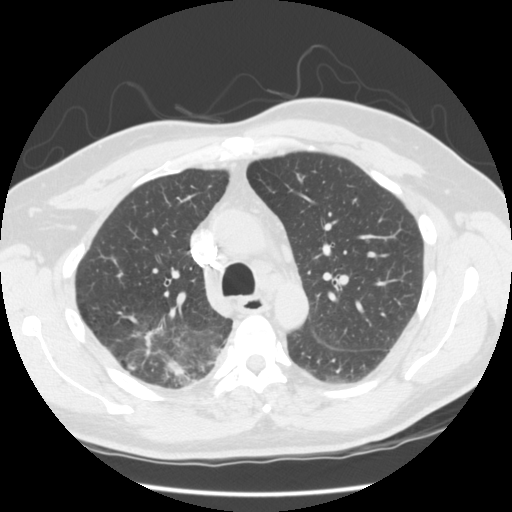
[im 156/221  mediastinal]
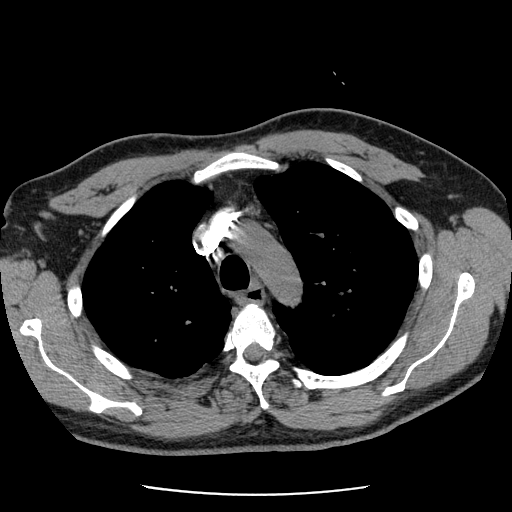
[im 169/221  lung]
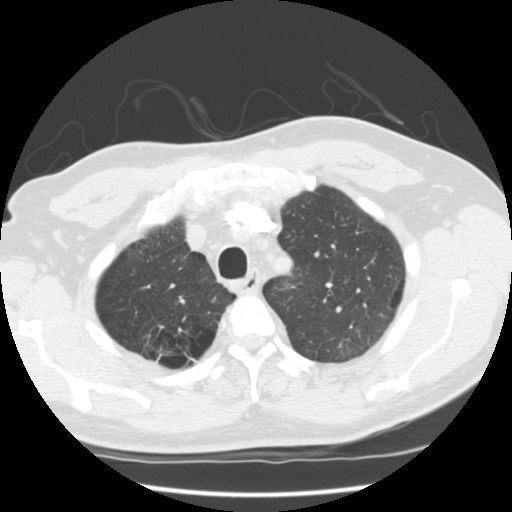
[im 182/221  mediastinal]
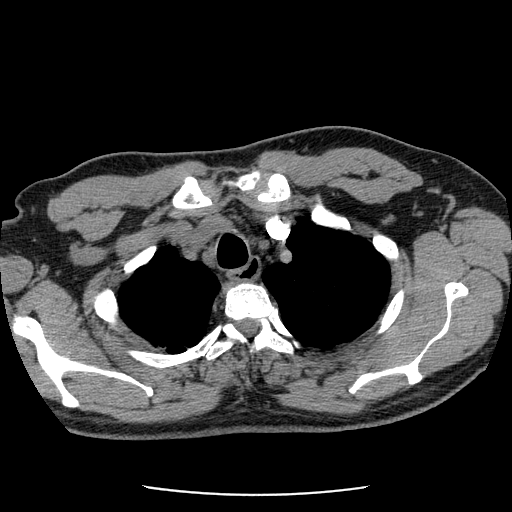
[im 195/221  lung]
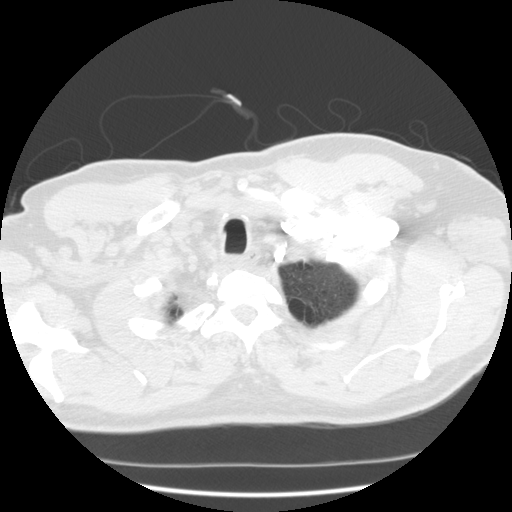
[im 208/221  mediastinal]
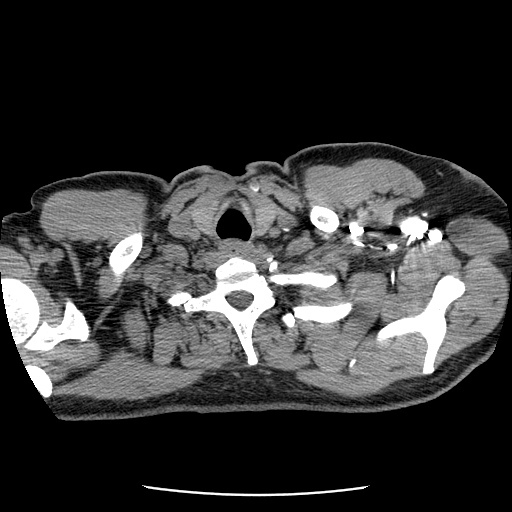

[Series 301: reformatted · coronal · 0.70mm/px · 3 of 129 slices shown]
[im 26/129  mediastinal]
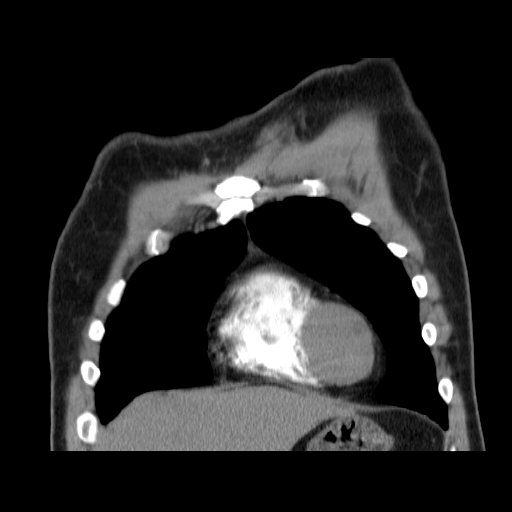
[im 52/129  mediastinal]
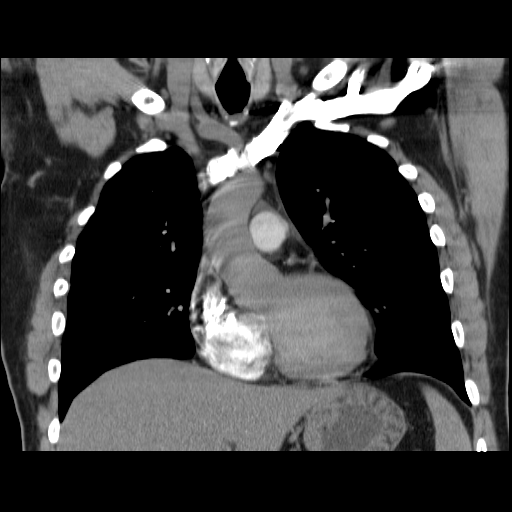
[im 77/129  mediastinal]
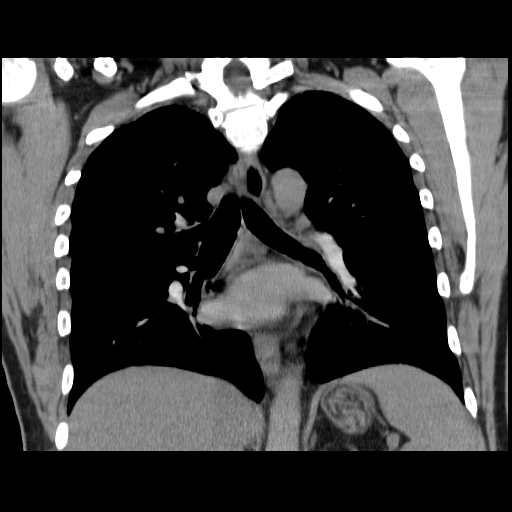

[19 of 36 positions shown; findings below may reference images not displayed]

FINDINGS: No definite pulmonary embolus.  Areas of low attenuation
in the pulmonary arteries are felt to be due to mixing artifact,
rather than pulmonary emboli.  Mediastinal lymph nodes measure up
to 1 cm in the low left paratracheal station.  Small hilar and
axillary lymph nodes.  Heart is within normal limits for size.  No
pericardial effusion.

Lungs are emphysematous.  There has been marked interval
improvement in multilobar airspace consolidation compared with
09/30/2007.  Areas of probable scarring are seen in the right upper
and right lower lobes.  No pleural fluid.  Airway unremarkable.

Incidental imaging of the upper abdomen shows a small low-density
lesion in the upper pole of the left kidney, too small to
characterize, measuring 8 mm.  No lytic or sclerotic lesions.
IMPRESSION: 1.  No definite pulmonary embolus.  Areas of low attenuation within
the pulmonary arteries are felt to be due to mixing artifact.
2.  Probable post infectious scarring in the right upper and right
lower lobes.
3.  Mediastinal and hilar lymph nodes may be reactive.

## 2009-04-08 ENCOUNTER — Ambulatory Visit (HOSPITAL_COMMUNITY): Payer: Self-pay | Admitting: Licensed Clinical Social Worker

## 2009-04-10 ENCOUNTER — Encounter: Payer: Self-pay | Admitting: Endocrinology

## 2009-04-13 ENCOUNTER — Encounter: Payer: Self-pay | Admitting: Internal Medicine

## 2009-04-14 ENCOUNTER — Ambulatory Visit (HOSPITAL_COMMUNITY): Payer: Self-pay | Admitting: Psychiatry

## 2009-04-15 ENCOUNTER — Telehealth: Payer: Self-pay | Admitting: Internal Medicine

## 2009-04-15 ENCOUNTER — Ambulatory Visit: Payer: Self-pay | Admitting: Internal Medicine

## 2009-04-15 LAB — CONVERTED CEMR LAB
AST: 32 units/L (ref 0–37)
Alkaline Phosphatase: 87 units/L (ref 39–117)
BUN: 11 mg/dL (ref 6–23)
Basophils Absolute: 0.1 10*3/uL (ref 0.0–0.1)
Bilirubin, Direct: 0.2 mg/dL (ref 0.0–0.3)
Calcium: 8.9 mg/dL (ref 8.4–10.5)
Eosinophils Relative: 1.2 % (ref 0.0–5.0)
GFR calc non Af Amer: 115.24 mL/min (ref >60)
Glucose, Bld: 128 mg/dL — ABNORMAL HIGH (ref 70–99)
Lymphocytes Relative: 29.5 % (ref 12.0–46.0)
Monocytes Relative: 11.2 % (ref 3.0–12.0)
Neutrophils Relative %: 57.4 % (ref 43.0–77.0)
Platelets: 121 10*3/uL — ABNORMAL LOW (ref 150.0–400.0)
RDW: 14.4 % (ref 11.5–14.6)
Specific Gravity, Urine: >=1.03 (ref 1.000–1.030)
TSH: 0.76 microintl units/mL (ref 0.35–5.50)
Total Bilirubin: 0.9 mg/dL (ref 0.3–1.2)
Total Protein, Urine: NEGATIVE mg/dL
Urine Glucose: NEGATIVE mg/dL
Urobilinogen, UA: 4 (ref 0.0–1.0)
WBC: 8.3 10*3/uL (ref 4.5–10.5)
pH: 5 (ref 5.0–8.0)

## 2009-04-16 ENCOUNTER — Encounter: Payer: Self-pay | Admitting: Internal Medicine

## 2009-04-22 ENCOUNTER — Ambulatory Visit (HOSPITAL_COMMUNITY): Payer: Self-pay | Admitting: Licensed Clinical Social Worker

## 2009-05-04 IMAGING — US US ABDOMEN COMPLETE
1 series · 13 of 25 positions shown · non-contrast
Comparison: 02/21/2006

CLINICAL DATA: Chest pain and shoulder pain

ABDOMEN ULTRASOUND
TECHNIQUE: Complete abdominal ultrasound examination was performed
including evaluation of the liver, gallbladder, bile ducts,
pancreas, kidneys, spleen, IVC, and abdominal aorta.

[Series 1: unknown · 0.34mm/px · 13 of 63 slices shown]
[im 1/63]
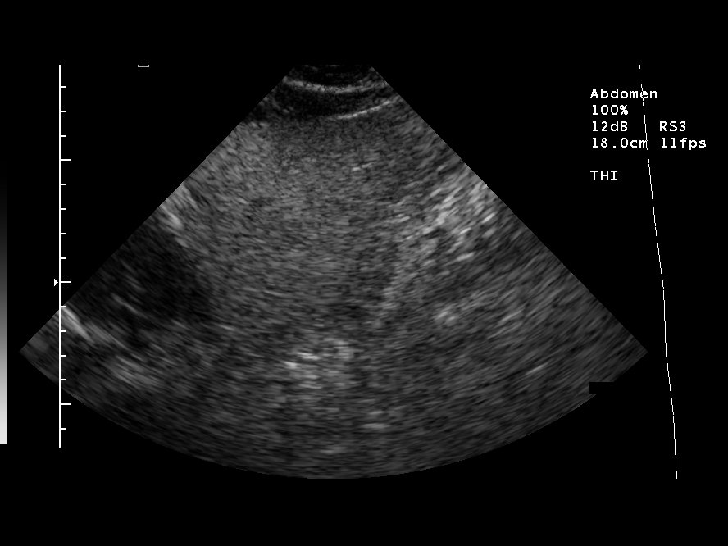
[im 6/63]
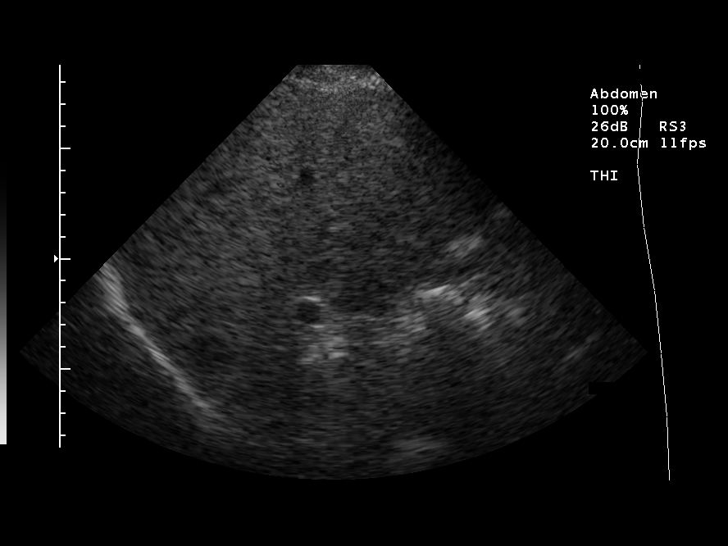
[im 11/63]
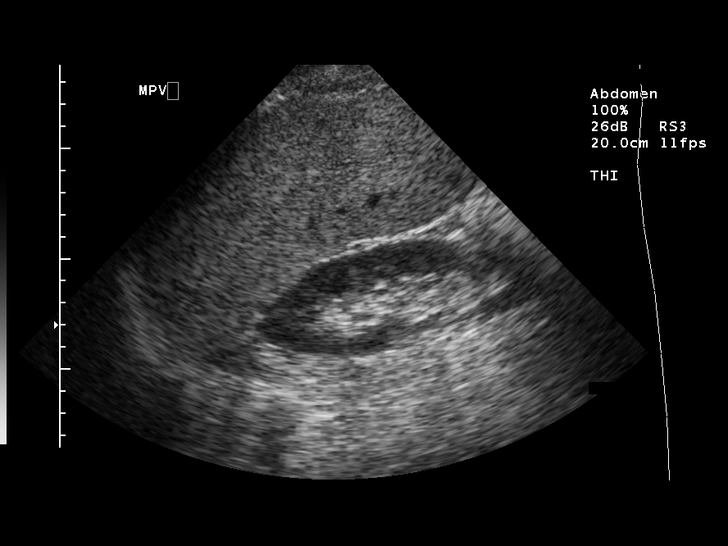
[im 16/63]
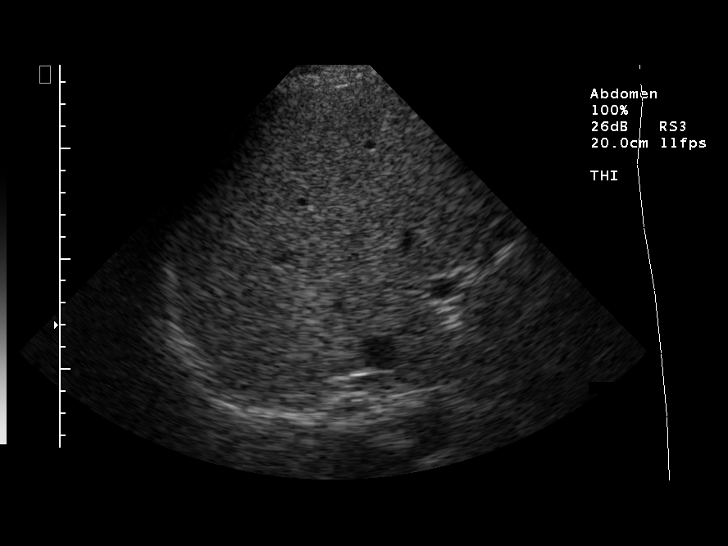
[im 21/63]
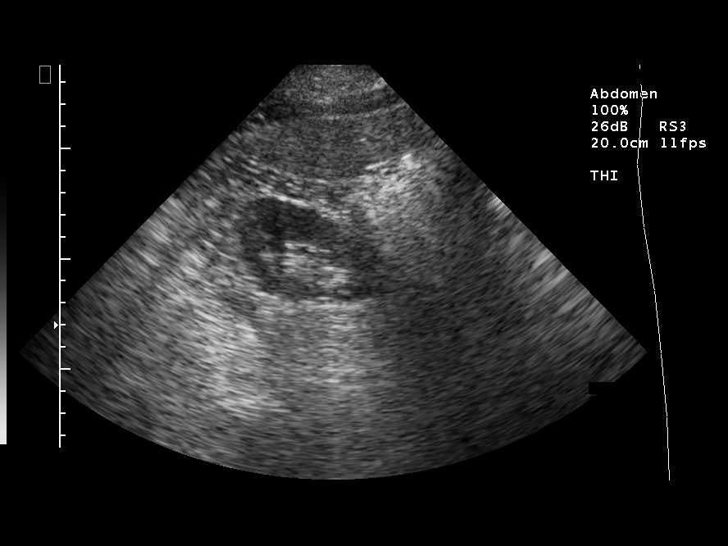
[im 26/63]
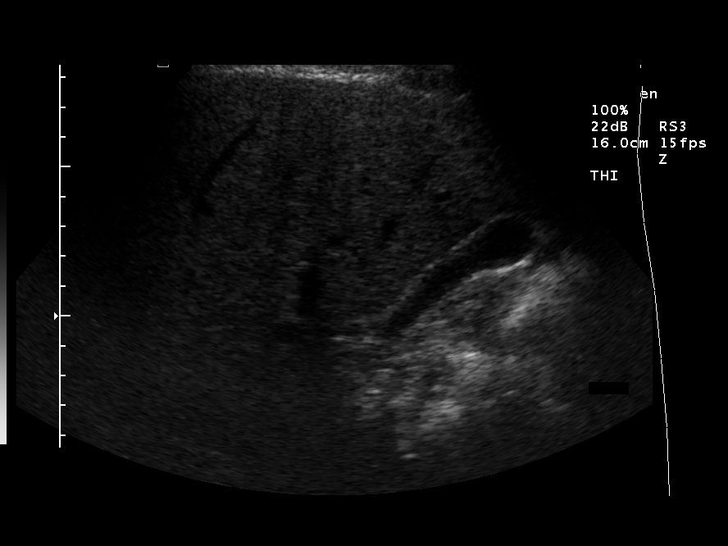
[im 32/63]
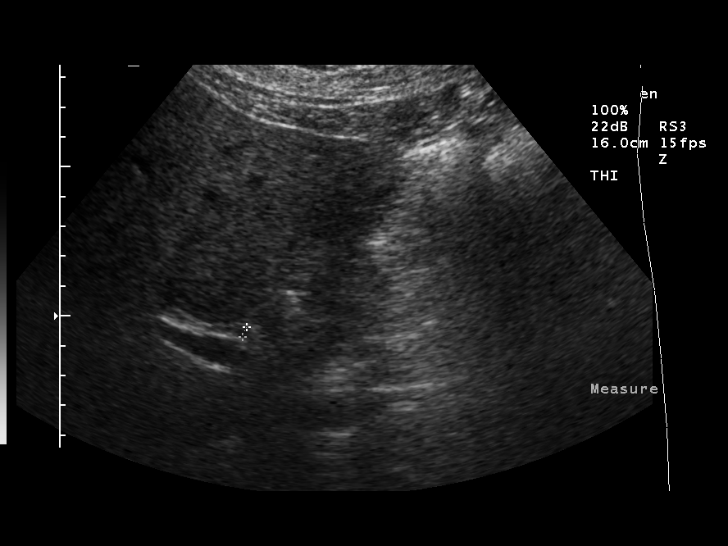
[im 37/63]
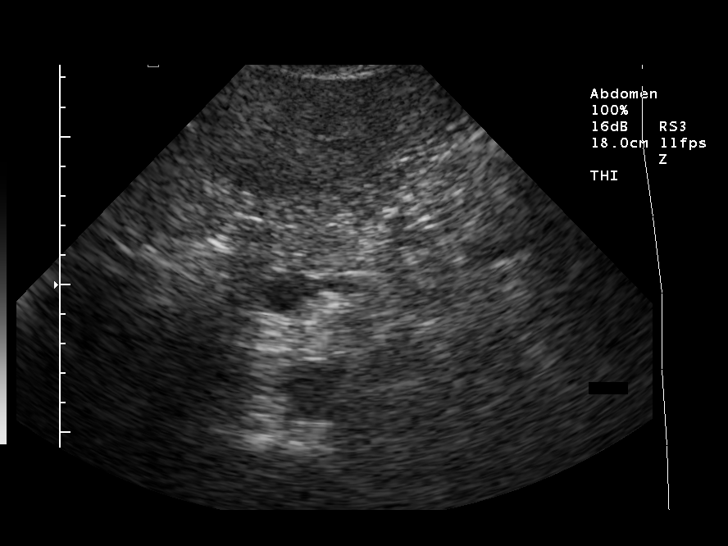
[im 42/63]
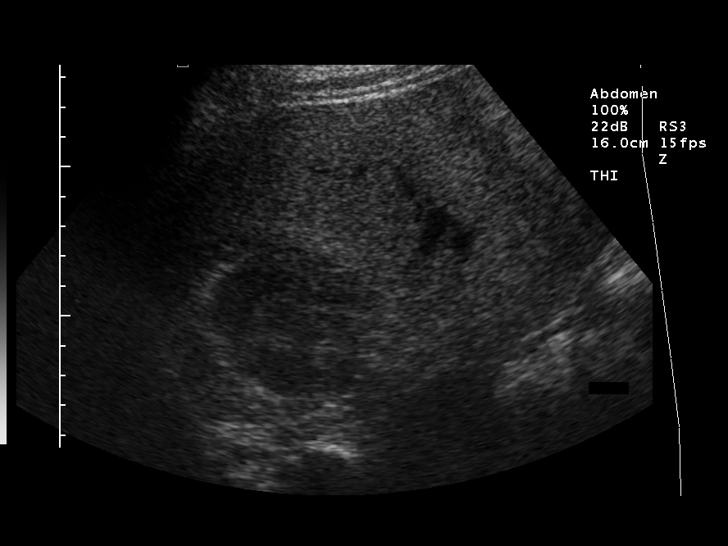
[im 47/63]
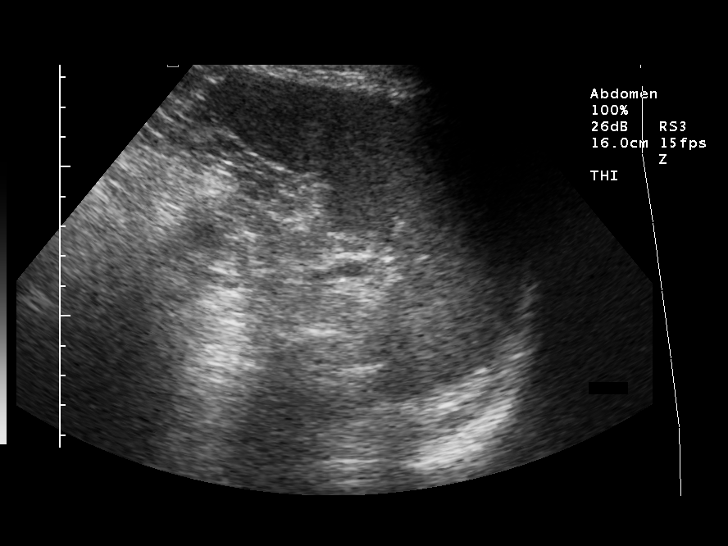
[im 52/63]
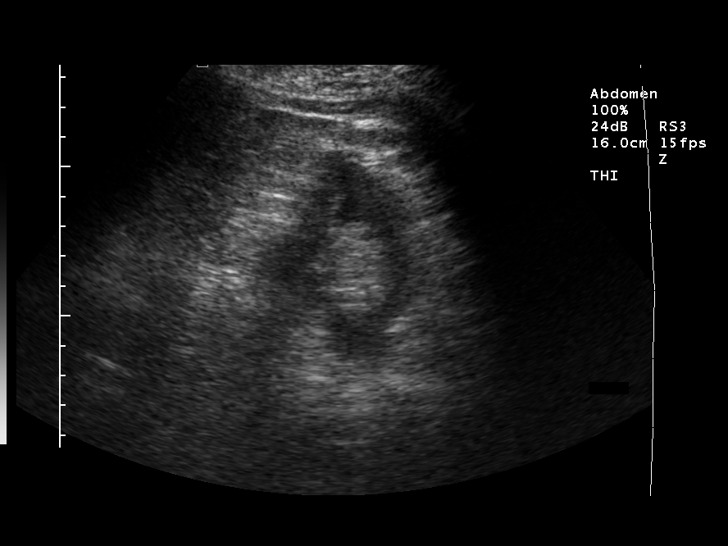
[im 57/63]
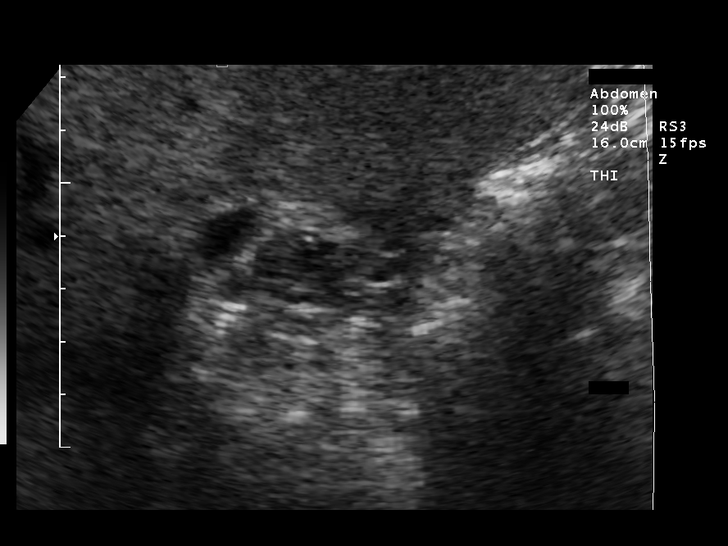
[im 63/63]
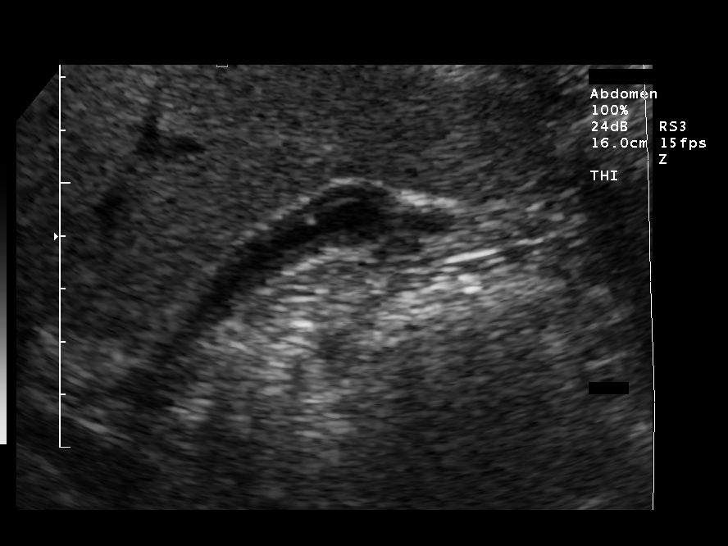

[13 of 25 positions shown; findings below may reference images not displayed]

FINDINGS: The liver measures up to 20.3 cm in length with slightly
increased echogenicity.  The main portal vein is patent.  The
proximal abdominal aorta measures up to 2.6 cm.  The main portal
vein measures 12 mm which is similar to the previous examination.
There is some distortion to the hepatic parenchyma suggesting fat
infiltration.  The gallbladder is decompressed.  The common bile
duct measures 4 mm.  Limited evaluation of the retroperitoneum and
pancreas.  Right kidney measures up to 14.1 cm without
hydronephrosis.  Spleen measures the 12.2 cm.  Left kidney measures
14.2 cm without hydronephrosis.  There is a 2 mm echogenic
structure within the gallbladder that may represent a small polyp.
The kidney sizes are large but have not significantly changed since
the previous exam and probably related to patient's body habitus.
IMPRESSION: No acute abdominal findings.

Increased echogenicity of the liver and findings suggestive for fat
infiltration.

Contracted gallbladder and probable small polyp.

## 2009-05-04 IMAGING — CR DG ABDOMEN ACUTE W/ 1V CHEST
3 series · 3 of 3 positions shown · non-contrast
Comparison: 11/27/2007 and CT from 01/02/2008

CLINICAL DATA: 47-year-old with chest pain

ACUTE ABDOMEN SERIES (ABDOMEN 2 VIEW & CHEST 1 VIEW)

[w chest pa]
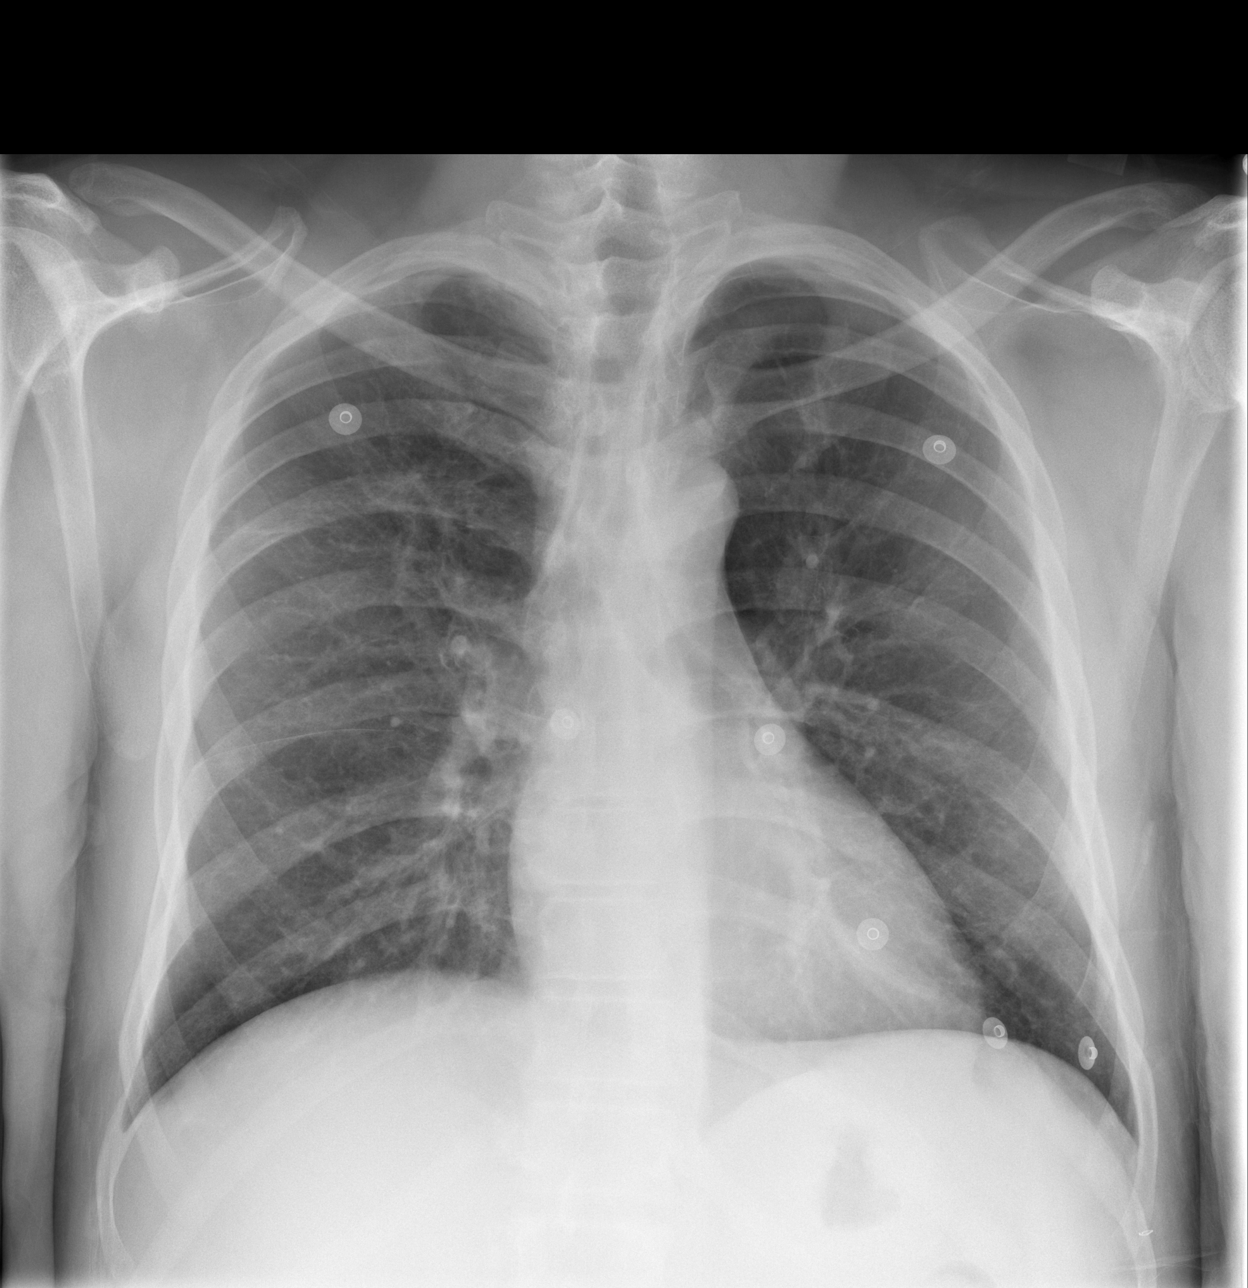

[w abdomen upright]
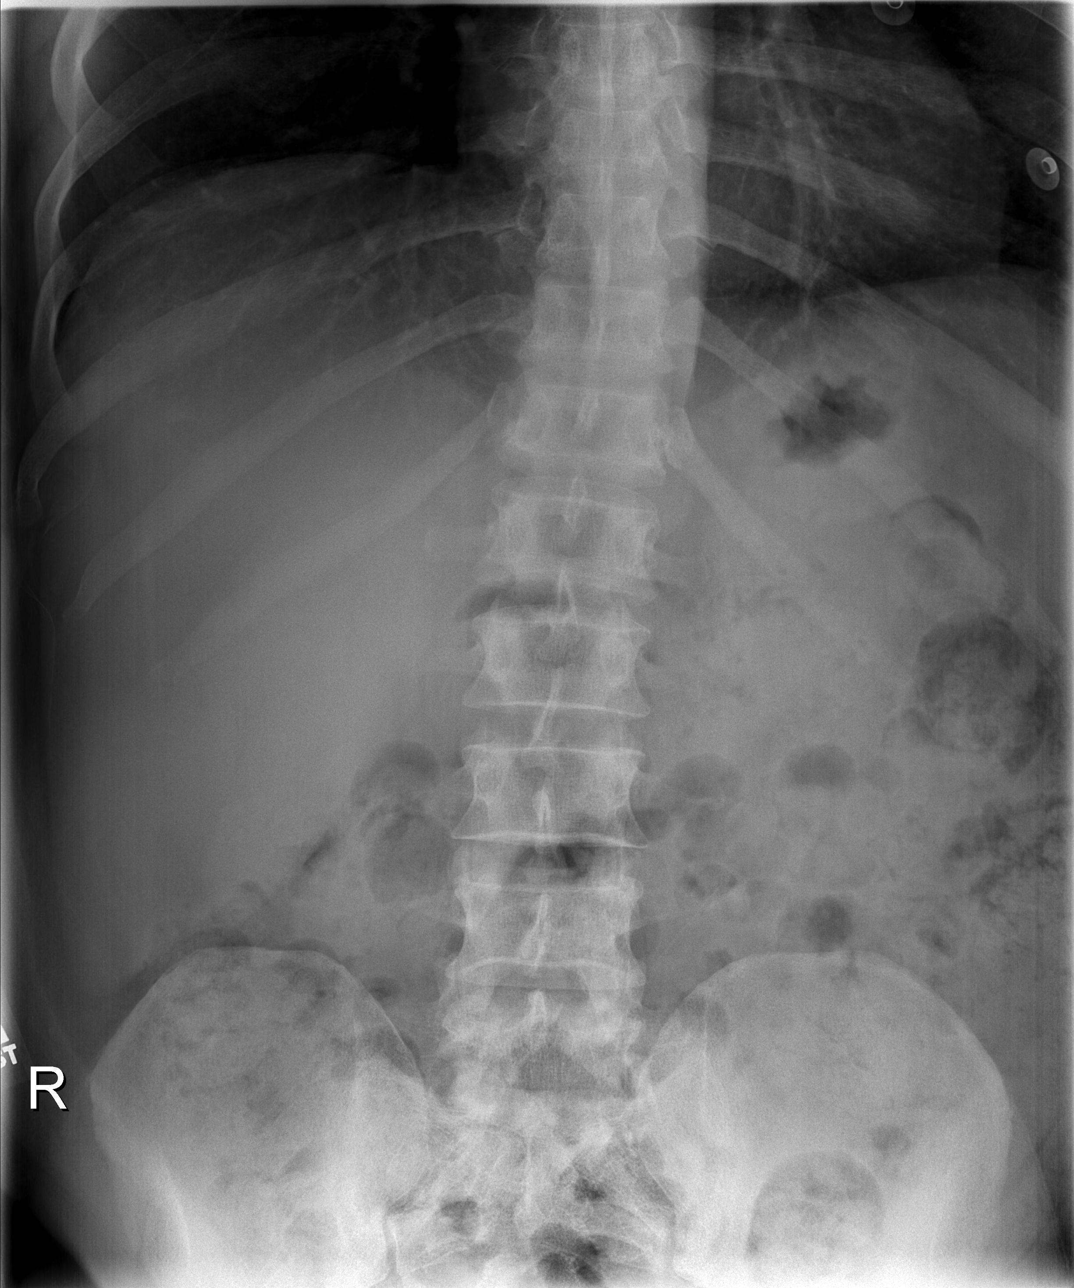

[t abdomen supine]
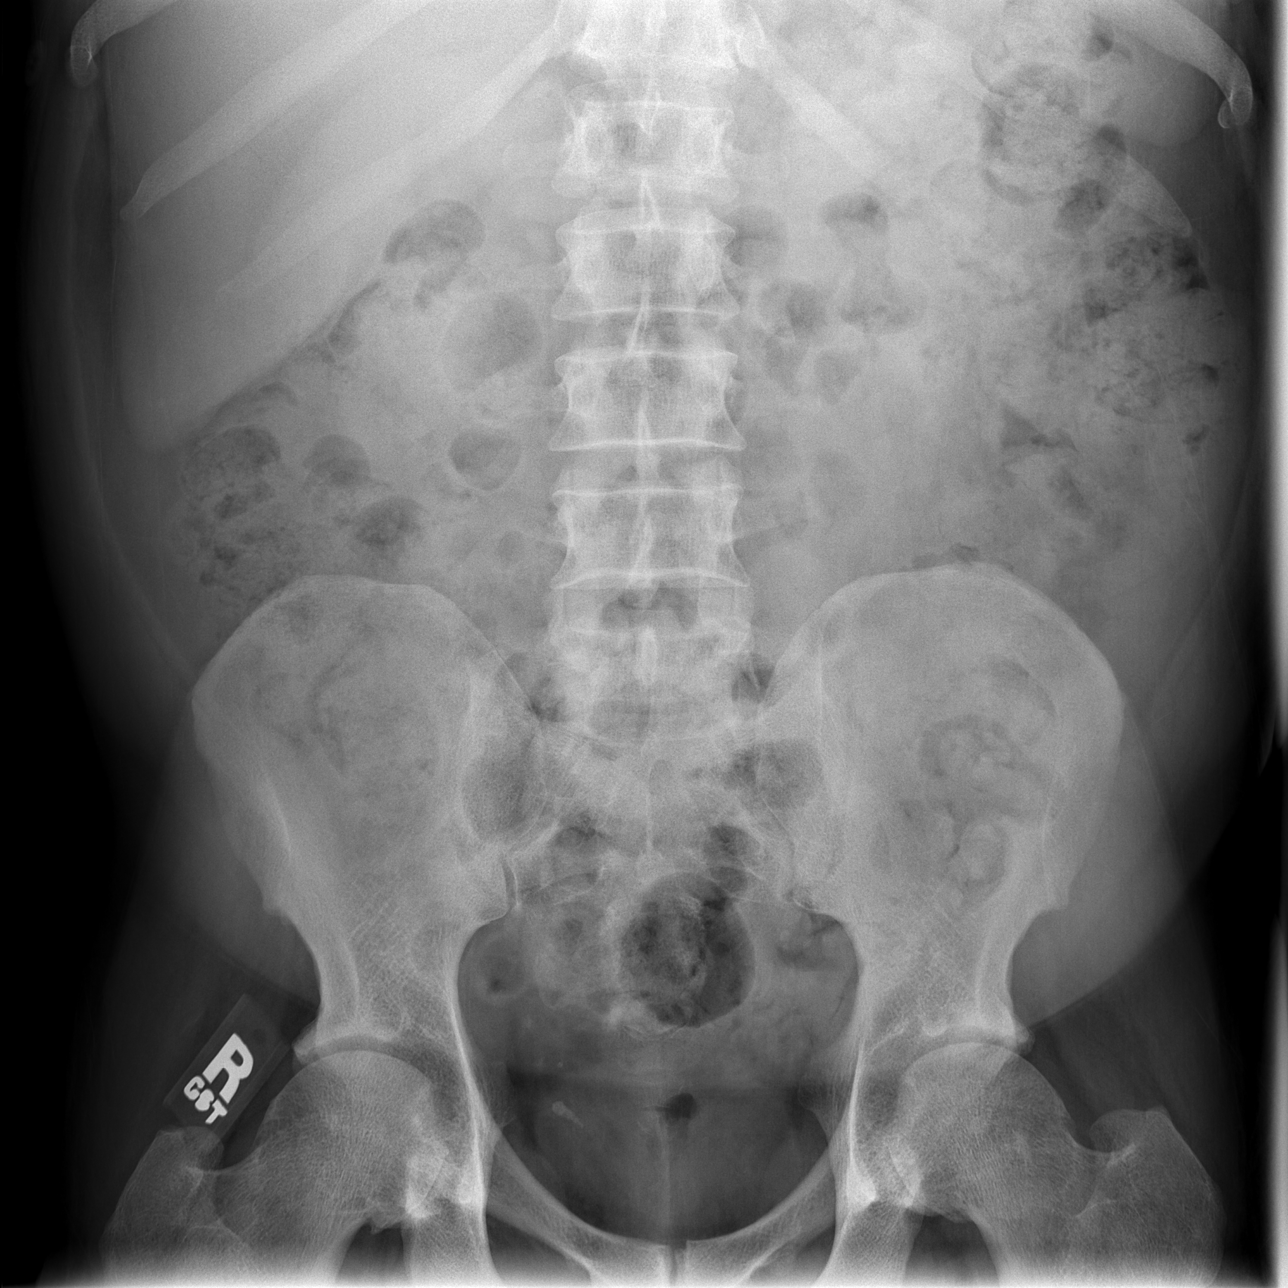

[3 of 3 positions shown; findings below may reference images not displayed]

FINDINGS: The chest radiograph demonstrates stable densities in the
right upper lung, probably related to scar.  There is no focal
airspace disease.  No evidence for free air.  The heart and
mediastinum are stable.  Large amount stool in the abdomen.. There
is a large calcification in the right hemipelvis.
IMPRESSION: Large amount of stool with a nonobstructive pattern.

No acute chest findings with probable scarring in the right upper
lobe.

## 2009-05-06 ENCOUNTER — Ambulatory Visit (HOSPITAL_COMMUNITY): Payer: Self-pay | Admitting: Licensed Clinical Social Worker

## 2009-05-07 ENCOUNTER — Ambulatory Visit: Payer: Self-pay | Admitting: Endocrinology

## 2009-05-08 LAB — CONVERTED CEMR LAB: Hgb A1c MFr Bld: 6.6 % — ABNORMAL HIGH (ref 4.6–6.5)

## 2009-05-20 ENCOUNTER — Telehealth: Payer: Self-pay | Admitting: Internal Medicine

## 2009-05-21 ENCOUNTER — Ambulatory Visit: Payer: Self-pay | Admitting: Internal Medicine

## 2009-06-08 ENCOUNTER — Telehealth: Payer: Self-pay | Admitting: Internal Medicine

## 2009-06-10 ENCOUNTER — Telehealth: Payer: Self-pay | Admitting: Internal Medicine

## 2009-06-15 ENCOUNTER — Encounter: Payer: Self-pay | Admitting: Cardiovascular Disease

## 2009-06-15 HISTORY — PX: OTHER SURGICAL HISTORY: SHX169

## 2009-06-16 ENCOUNTER — Ambulatory Visit (HOSPITAL_COMMUNITY): Payer: Self-pay | Admitting: Licensed Clinical Social Worker

## 2009-06-23 ENCOUNTER — Telehealth: Payer: Self-pay | Admitting: Endocrinology

## 2009-06-23 ENCOUNTER — Ambulatory Visit: Payer: Self-pay | Admitting: Endocrinology

## 2009-06-25 ENCOUNTER — Telehealth (INDEPENDENT_AMBULATORY_CARE_PROVIDER_SITE_OTHER): Payer: Self-pay | Admitting: *Deleted

## 2009-06-25 LAB — CONVERTED CEMR LAB: Fructosamine: 180 micromoles/L (ref <285)

## 2009-06-26 ENCOUNTER — Ambulatory Visit (HOSPITAL_COMMUNITY): Payer: Self-pay | Admitting: Licensed Clinical Social Worker

## 2009-07-06 ENCOUNTER — Encounter: Payer: Self-pay | Admitting: Cardiovascular Disease

## 2009-07-06 ENCOUNTER — Encounter: Payer: Self-pay | Admitting: Internal Medicine

## 2009-07-14 ENCOUNTER — Ambulatory Visit: Payer: Self-pay | Admitting: Internal Medicine

## 2009-07-16 ENCOUNTER — Encounter: Payer: Self-pay | Admitting: Internal Medicine

## 2009-07-27 ENCOUNTER — Encounter: Admission: RE | Admit: 2009-07-27 | Discharge: 2009-07-27 | Payer: Self-pay | Admitting: Cardiovascular Disease

## 2009-07-27 HISTORY — PX: ILIAC ARTERY STENT: SHX1786

## 2009-07-28 ENCOUNTER — Inpatient Hospital Stay (HOSPITAL_COMMUNITY): Admission: RE | Admit: 2009-07-28 | Discharge: 2009-07-29 | Payer: Self-pay | Admitting: Cardiovascular Disease

## 2009-07-29 ENCOUNTER — Telehealth: Payer: Self-pay | Admitting: Internal Medicine

## 2009-08-14 ENCOUNTER — Ambulatory Visit: Payer: Self-pay | Admitting: Internal Medicine

## 2009-08-14 LAB — CONVERTED CEMR LAB
ALT: 19 units/L (ref 0–53)
AST: 10 units/L (ref 0–37)
BUN: 7 mg/dL (ref 6–23)
Bilirubin Urine: NEGATIVE
Bilirubin, Direct: 0.1 mg/dL (ref 0.0–0.3)
Eosinophils Relative: 1.6 % (ref 0.0–5.0)
GFR calc non Af Amer: 115.08 mL/min (ref >60)
HCT: 43.2 % (ref 39.0–52.0)
Hemoglobin, Urine: NEGATIVE
Ketones, ur: NEGATIVE mg/dL
Monocytes Relative: 7.6 % (ref 3.0–12.0)
Neutrophils Relative %: 53.1 % (ref 43.0–77.0)
Platelets: 134 10*3/uL — ABNORMAL LOW (ref 150.0–400.0)
Potassium: 4.1 meq/L (ref 3.5–5.1)
RBC: 4.69 M/uL (ref 4.22–5.81)
Total Bilirubin: 0.7 mg/dL (ref 0.3–1.2)
Total Protein, Urine: NEGATIVE mg/dL
Urine Glucose: NEGATIVE mg/dL
WBC: 7.2 10*3/uL (ref 4.5–10.5)

## 2009-08-18 ENCOUNTER — Ambulatory Visit: Payer: Self-pay | Admitting: Internal Medicine

## 2009-08-19 ENCOUNTER — Encounter: Payer: Self-pay | Admitting: Internal Medicine

## 2009-08-21 IMAGING — CR DG CHEST 1V PORT
1 series · 1 of 1 positions shown · non-contrast
Comparison: 04/11/2008

CLINICAL DATA: And shortness of breath

PORTABLE CHEST - 1 VIEW

[AP]
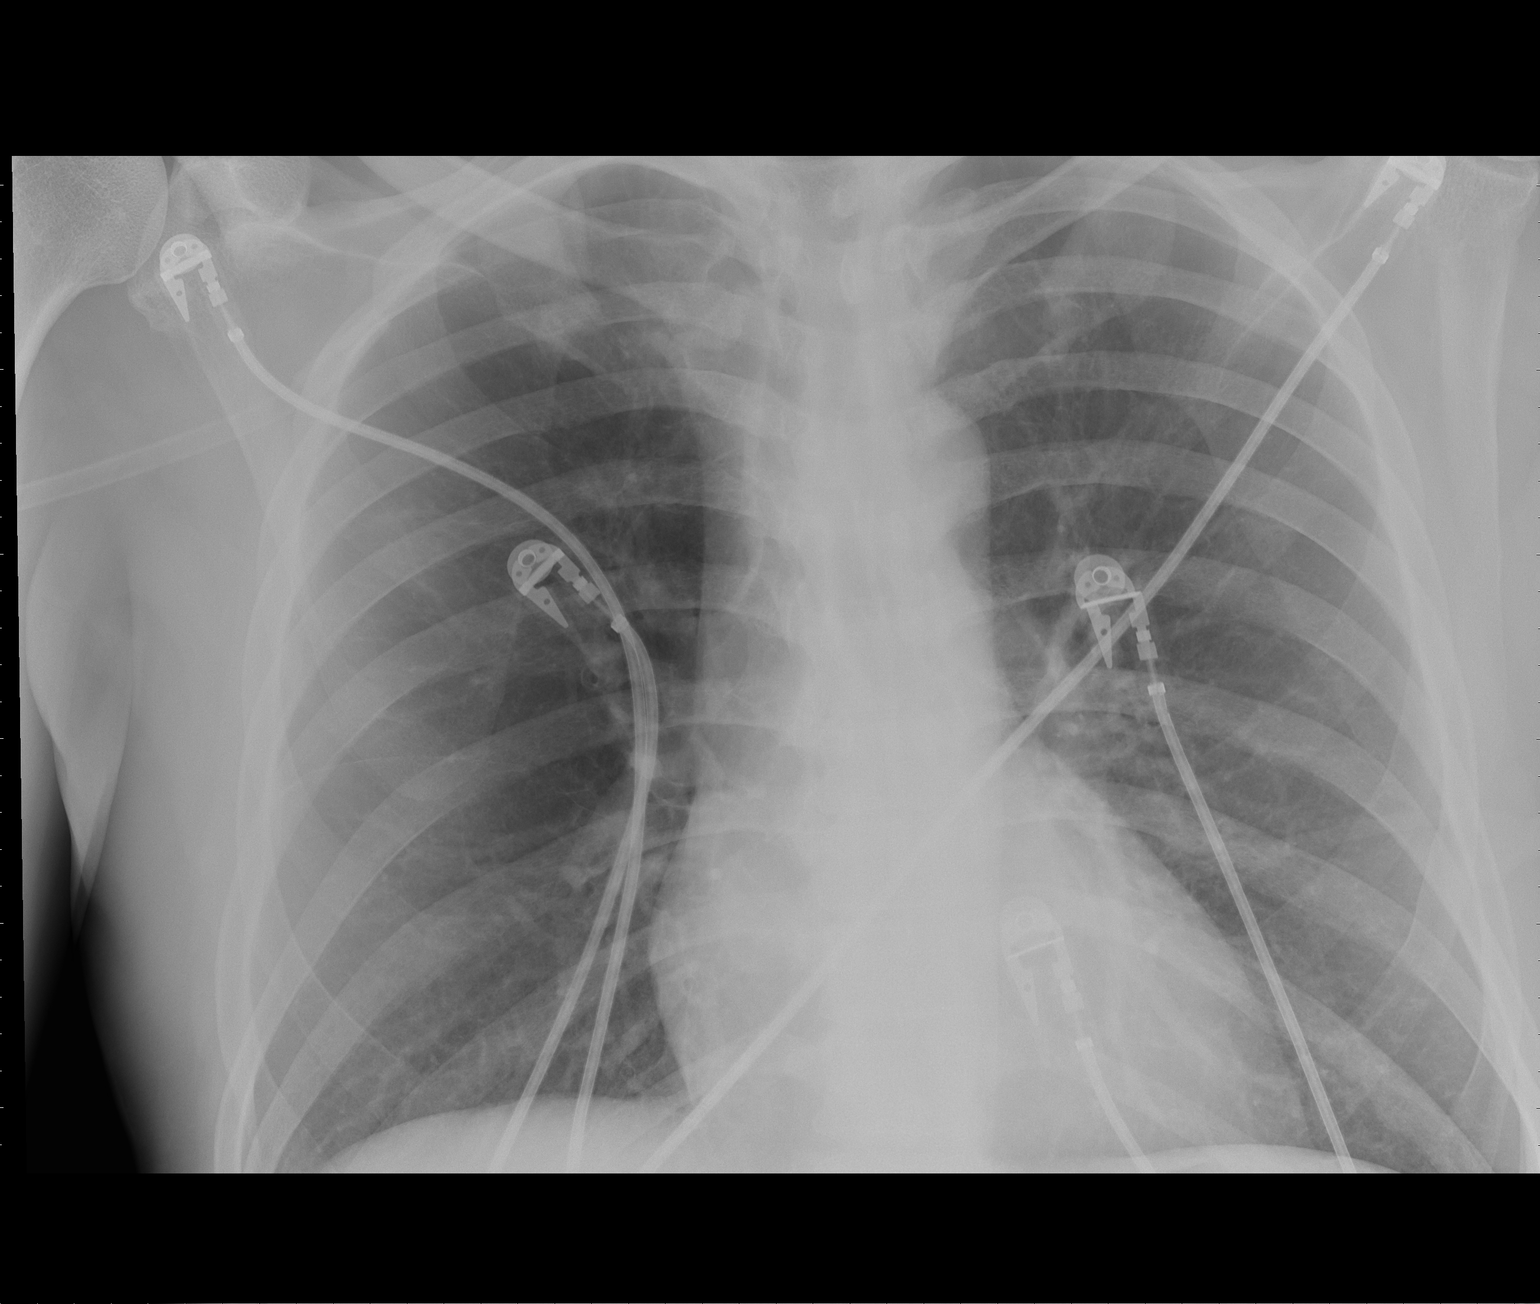

[1 of 1 positions shown; findings below may reference images not displayed]

FINDINGS: Lungs are clear.  The heart is normal in size.  No
pneumothorax or effusion.
IMPRESSION: No active cardiopulmonary disease.

## 2009-08-24 ENCOUNTER — Telehealth: Payer: Self-pay | Admitting: Internal Medicine

## 2009-08-28 ENCOUNTER — Ambulatory Visit (HOSPITAL_COMMUNITY): Payer: Self-pay | Admitting: Licensed Clinical Social Worker

## 2009-08-31 ENCOUNTER — Telehealth: Payer: Self-pay | Admitting: Internal Medicine

## 2009-09-08 ENCOUNTER — Telehealth: Payer: Self-pay | Admitting: Internal Medicine

## 2009-09-15 ENCOUNTER — Telehealth: Payer: Self-pay | Admitting: Internal Medicine

## 2009-09-15 ENCOUNTER — Ambulatory Visit: Payer: Self-pay | Admitting: Internal Medicine

## 2009-09-15 DIAGNOSIS — I739 Peripheral vascular disease, unspecified: Secondary | ICD-10-CM | POA: Insufficient documentation

## 2009-09-15 LAB — CONVERTED CEMR LAB
Chlamydia, Swab/Urine, PCR: NEGATIVE
Specific Gravity, Urine: 1.025 (ref 1.000–1.030)
Total Protein, Urine: NEGATIVE mg/dL
Urine Glucose: NEGATIVE mg/dL
Urobilinogen, UA: 1 (ref 0.0–1.0)

## 2009-09-16 ENCOUNTER — Telehealth: Payer: Self-pay | Admitting: Internal Medicine

## 2009-09-26 HISTORY — PX: OTHER SURGICAL HISTORY: SHX169

## 2009-09-28 ENCOUNTER — Telehealth: Payer: Self-pay | Admitting: Internal Medicine

## 2009-09-30 ENCOUNTER — Encounter: Payer: Self-pay | Admitting: Internal Medicine

## 2009-10-05 ENCOUNTER — Telehealth (INDEPENDENT_AMBULATORY_CARE_PROVIDER_SITE_OTHER): Payer: Self-pay | Admitting: *Deleted

## 2009-10-05 ENCOUNTER — Telehealth: Payer: Self-pay | Admitting: Internal Medicine

## 2009-10-05 ENCOUNTER — Ambulatory Visit: Payer: Self-pay | Admitting: Cardiovascular Disease

## 2009-10-05 DIAGNOSIS — I428 Other cardiomyopathies: Secondary | ICD-10-CM | POA: Insufficient documentation

## 2009-10-05 DIAGNOSIS — I10 Essential (primary) hypertension: Secondary | ICD-10-CM | POA: Insufficient documentation

## 2009-10-05 DIAGNOSIS — I429 Cardiomyopathy, unspecified: Secondary | ICD-10-CM

## 2009-10-14 ENCOUNTER — Ambulatory Visit: Payer: Self-pay | Admitting: Internal Medicine

## 2009-10-14 ENCOUNTER — Telehealth: Payer: Self-pay | Admitting: Internal Medicine

## 2009-10-14 DIAGNOSIS — L84 Corns and callosities: Secondary | ICD-10-CM | POA: Insufficient documentation

## 2009-10-20 ENCOUNTER — Ambulatory Visit (HOSPITAL_COMMUNITY): Payer: Self-pay | Admitting: Licensed Clinical Social Worker

## 2009-10-21 ENCOUNTER — Ambulatory Visit: Payer: Self-pay | Admitting: Internal Medicine

## 2009-10-23 ENCOUNTER — Ambulatory Visit: Payer: Self-pay | Admitting: Internal Medicine

## 2009-10-27 ENCOUNTER — Ambulatory Visit (HOSPITAL_COMMUNITY): Payer: Self-pay | Admitting: Psychiatry

## 2009-10-28 ENCOUNTER — Ambulatory Visit: Payer: Self-pay | Admitting: Internal Medicine

## 2009-10-28 ENCOUNTER — Telehealth: Payer: Self-pay | Admitting: Internal Medicine

## 2009-10-30 ENCOUNTER — Ambulatory Visit: Payer: Self-pay

## 2009-10-30 ENCOUNTER — Encounter: Payer: Self-pay | Admitting: Cardiovascular Disease

## 2009-11-02 ENCOUNTER — Ambulatory Visit: Payer: Self-pay | Admitting: Internal Medicine

## 2009-11-03 ENCOUNTER — Telehealth (INDEPENDENT_AMBULATORY_CARE_PROVIDER_SITE_OTHER): Payer: Self-pay | Admitting: *Deleted

## 2009-11-03 ENCOUNTER — Encounter: Payer: Self-pay | Admitting: Internal Medicine

## 2009-11-05 ENCOUNTER — Encounter: Payer: Self-pay | Admitting: Adult Health

## 2009-11-09 ENCOUNTER — Telehealth (INDEPENDENT_AMBULATORY_CARE_PROVIDER_SITE_OTHER): Payer: Self-pay | Admitting: *Deleted

## 2009-11-10 ENCOUNTER — Ambulatory Visit: Payer: Self-pay | Admitting: Emergency Medicine

## 2009-11-10 ENCOUNTER — Telehealth: Payer: Self-pay | Admitting: Pulmonary Disease

## 2009-11-11 ENCOUNTER — Ambulatory Visit (HOSPITAL_COMMUNITY): Payer: Self-pay | Admitting: Psychiatry

## 2009-11-16 ENCOUNTER — Ambulatory Visit: Payer: Self-pay | Admitting: Internal Medicine

## 2009-11-17 ENCOUNTER — Ambulatory Visit (HOSPITAL_COMMUNITY): Payer: Self-pay | Admitting: Licensed Clinical Social Worker

## 2009-11-23 ENCOUNTER — Telehealth: Payer: Self-pay | Admitting: Cardiovascular Disease

## 2009-11-24 ENCOUNTER — Telehealth: Payer: Self-pay | Admitting: Internal Medicine

## 2009-11-30 ENCOUNTER — Ambulatory Visit (HOSPITAL_COMMUNITY): Payer: Self-pay | Admitting: Licensed Clinical Social Worker

## 2009-12-14 ENCOUNTER — Ambulatory Visit: Payer: Self-pay | Admitting: Internal Medicine

## 2009-12-14 ENCOUNTER — Telehealth (INDEPENDENT_AMBULATORY_CARE_PROVIDER_SITE_OTHER): Payer: Self-pay | Admitting: *Deleted

## 2009-12-14 DIAGNOSIS — IMO0001 Reserved for inherently not codable concepts without codable children: Secondary | ICD-10-CM | POA: Insufficient documentation

## 2009-12-14 DIAGNOSIS — F17200 Nicotine dependence, unspecified, uncomplicated: Secondary | ICD-10-CM | POA: Insufficient documentation

## 2009-12-14 DIAGNOSIS — F17210 Nicotine dependence, cigarettes, uncomplicated: Secondary | ICD-10-CM

## 2009-12-14 DIAGNOSIS — B370 Candidal stomatitis: Secondary | ICD-10-CM | POA: Insufficient documentation

## 2009-12-21 ENCOUNTER — Ambulatory Visit: Payer: Self-pay | Admitting: Internal Medicine

## 2009-12-21 LAB — CONVERTED CEMR LAB
ALT: 22 units/L (ref 0–53)
AST: 18 units/L (ref 0–37)
Alkaline Phosphatase: 65 units/L (ref 39–117)
BUN: 11 mg/dL (ref 6–23)
Bilirubin, Direct: 0.2 mg/dL (ref 0.0–0.3)
Chlamydia, Swab/Urine, PCR: NEGATIVE
Eosinophils Relative: 1.2 % (ref 0.0–5.0)
GFR calc non Af Amer: 114.92 mL/min (ref >60)
HCT: 49.8 % (ref 39.0–52.0)
Ketones, ur: NEGATIVE mg/dL
Lymphocytes Relative: 27.4 % (ref 12.0–46.0)
Lymphs Abs: 2.4 10*3/uL (ref 0.7–4.0)
Monocytes Relative: 8.1 % (ref 3.0–12.0)
PSA: 0.47 ng/mL (ref 0.10–4.00)
Platelets: 122 10*3/uL — ABNORMAL LOW (ref 150.0–400.0)
Potassium: 4.3 meq/L (ref 3.5–5.1)
Sodium: 141 meq/L (ref 135–145)
Specific Gravity, Urine: 1.025 (ref 1.000–1.030)
TSH: 1.1 microintl units/mL (ref 0.35–5.50)
Total Bilirubin: 0.5 mg/dL (ref 0.3–1.2)
Total Protein, Urine: NEGATIVE mg/dL
Urine Glucose: NEGATIVE mg/dL
WBC: 8.9 10*3/uL (ref 4.5–10.5)
pH: 6 (ref 5.0–8.0)

## 2009-12-24 ENCOUNTER — Ambulatory Visit (HOSPITAL_COMMUNITY): Payer: Self-pay | Admitting: Licensed Clinical Social Worker

## 2009-12-31 IMAGING — CR DG CHEST 1V PORT
1 series · 1 of 1 positions shown · non-contrast
Comparison: Chest radiograph 09/01/2008 and chest CT 01/02/2008

CLINICAL DATA: Chest pain

PORTABLE CHEST - 1 VIEW

[view not recorded]
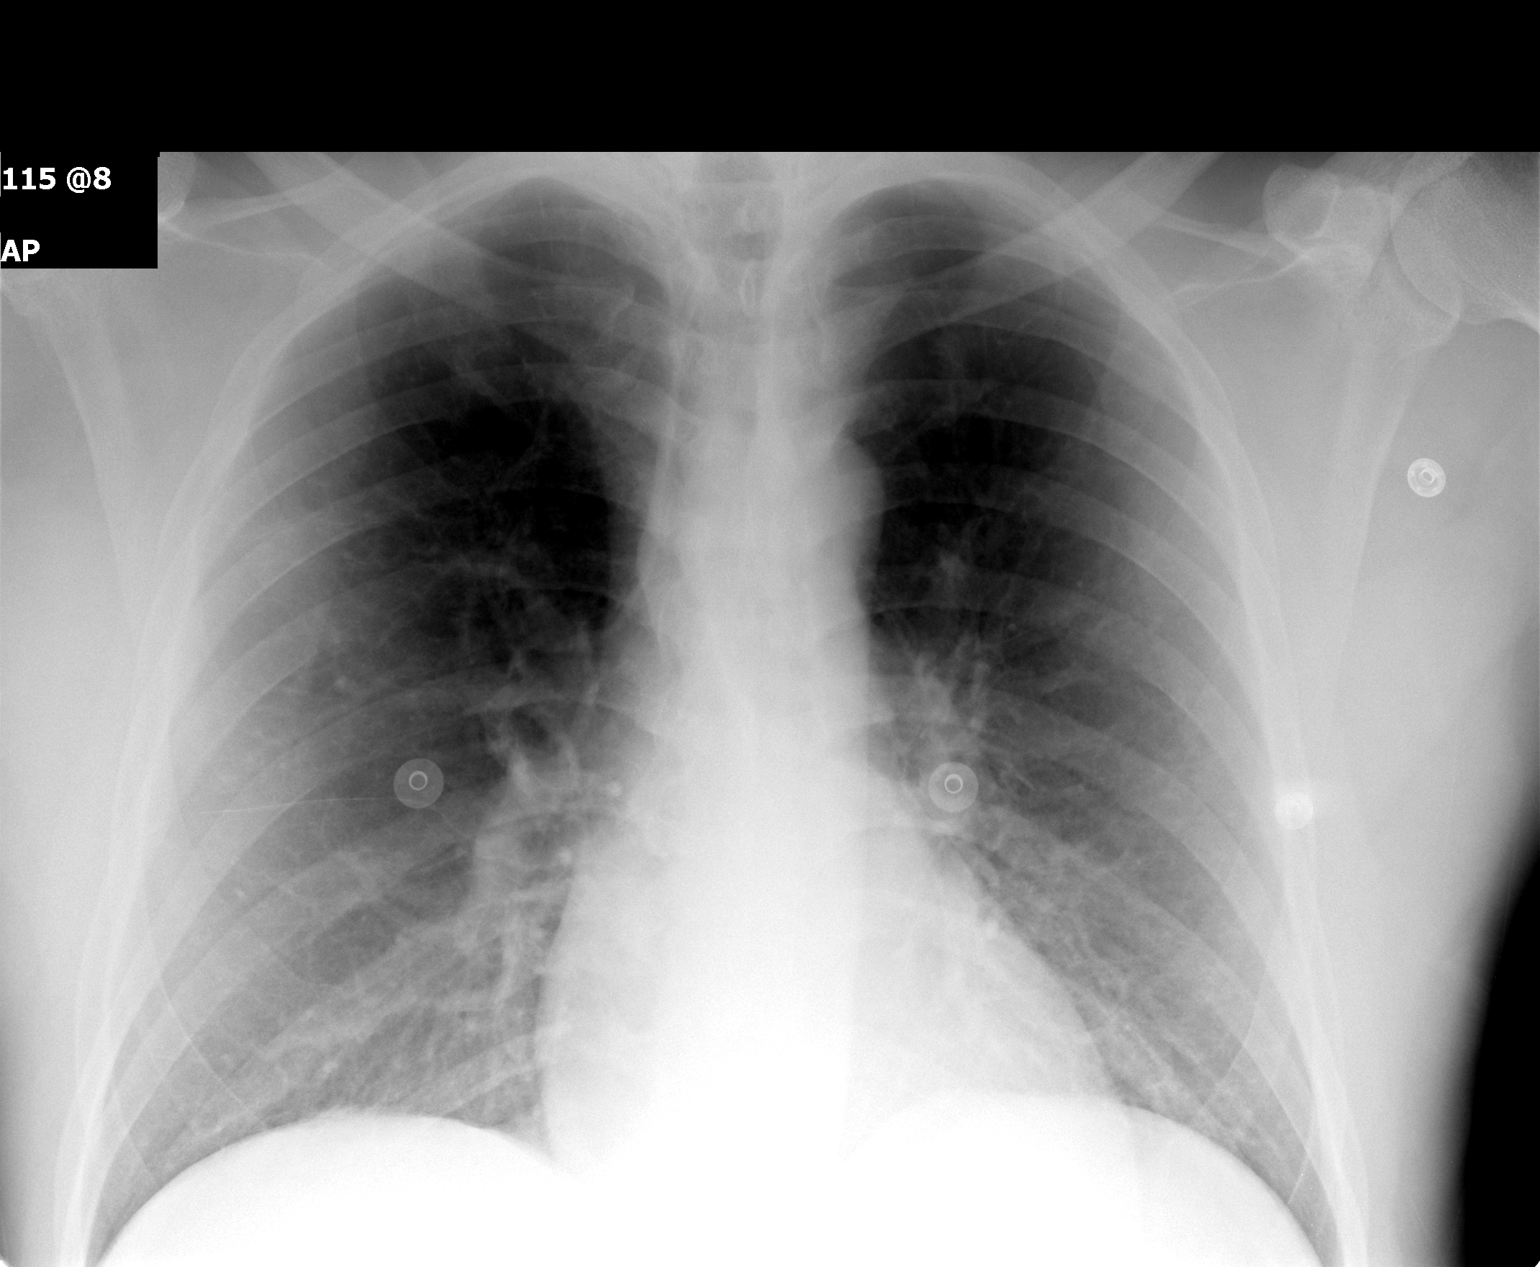

[1 of 1 positions shown; findings below may reference images not displayed]

FINDINGS: Lung volumes are slightly low.  The extreme distal aspect
of the costophrenic angles are not included on this image.  Heart
and mediastinal contours within normal limits.

Relative lucency of the apices compared to the lung bases suggests
underlying emphysema.  Negative for edema, airspace disease,
pneumothorax or definite evidence of effusion.
IMPRESSION: COPD without acute findings.

## 2010-01-07 ENCOUNTER — Telehealth: Payer: Self-pay | Admitting: Internal Medicine

## 2010-01-08 ENCOUNTER — Ambulatory Visit (HOSPITAL_COMMUNITY): Payer: Self-pay | Admitting: Licensed Clinical Social Worker

## 2010-02-15 ENCOUNTER — Ambulatory Visit: Payer: Self-pay | Admitting: Internal Medicine

## 2010-02-15 LAB — CONVERTED CEMR LAB
Hemoglobin, Urine: NEGATIVE
Ketones, ur: NEGATIVE mg/dL
Total Protein, Urine: NEGATIVE mg/dL
Urine Glucose: NEGATIVE mg/dL
Urobilinogen, UA: 0.2 (ref 0.0–1.0)

## 2010-02-18 ENCOUNTER — Ambulatory Visit (HOSPITAL_COMMUNITY): Payer: Self-pay | Admitting: Licensed Clinical Social Worker

## 2010-02-18 ENCOUNTER — Encounter: Payer: Self-pay | Admitting: Internal Medicine

## 2010-02-23 ENCOUNTER — Ambulatory Visit (HOSPITAL_COMMUNITY): Payer: Self-pay | Admitting: Psychiatry

## 2010-03-01 ENCOUNTER — Telehealth: Payer: Self-pay | Admitting: Internal Medicine

## 2010-03-03 ENCOUNTER — Encounter: Payer: Self-pay | Admitting: Endocrinology

## 2010-03-09 IMAGING — CR DG CHEST 1V PORT
1 series · 1 of 1 positions shown · non-contrast
Comparison: Portable exam 2223 hours compared to 10/11/2008

Addendum Begins

Impression should be corrected to state "No acute PULMONARY
abnormalities".
Addendum Ends
CLINICAL DATA: Shortness of breath, fever, hypertension, COPD,
diabetes
PORTABLE CHEST - 1 VIEW

[view not recorded]
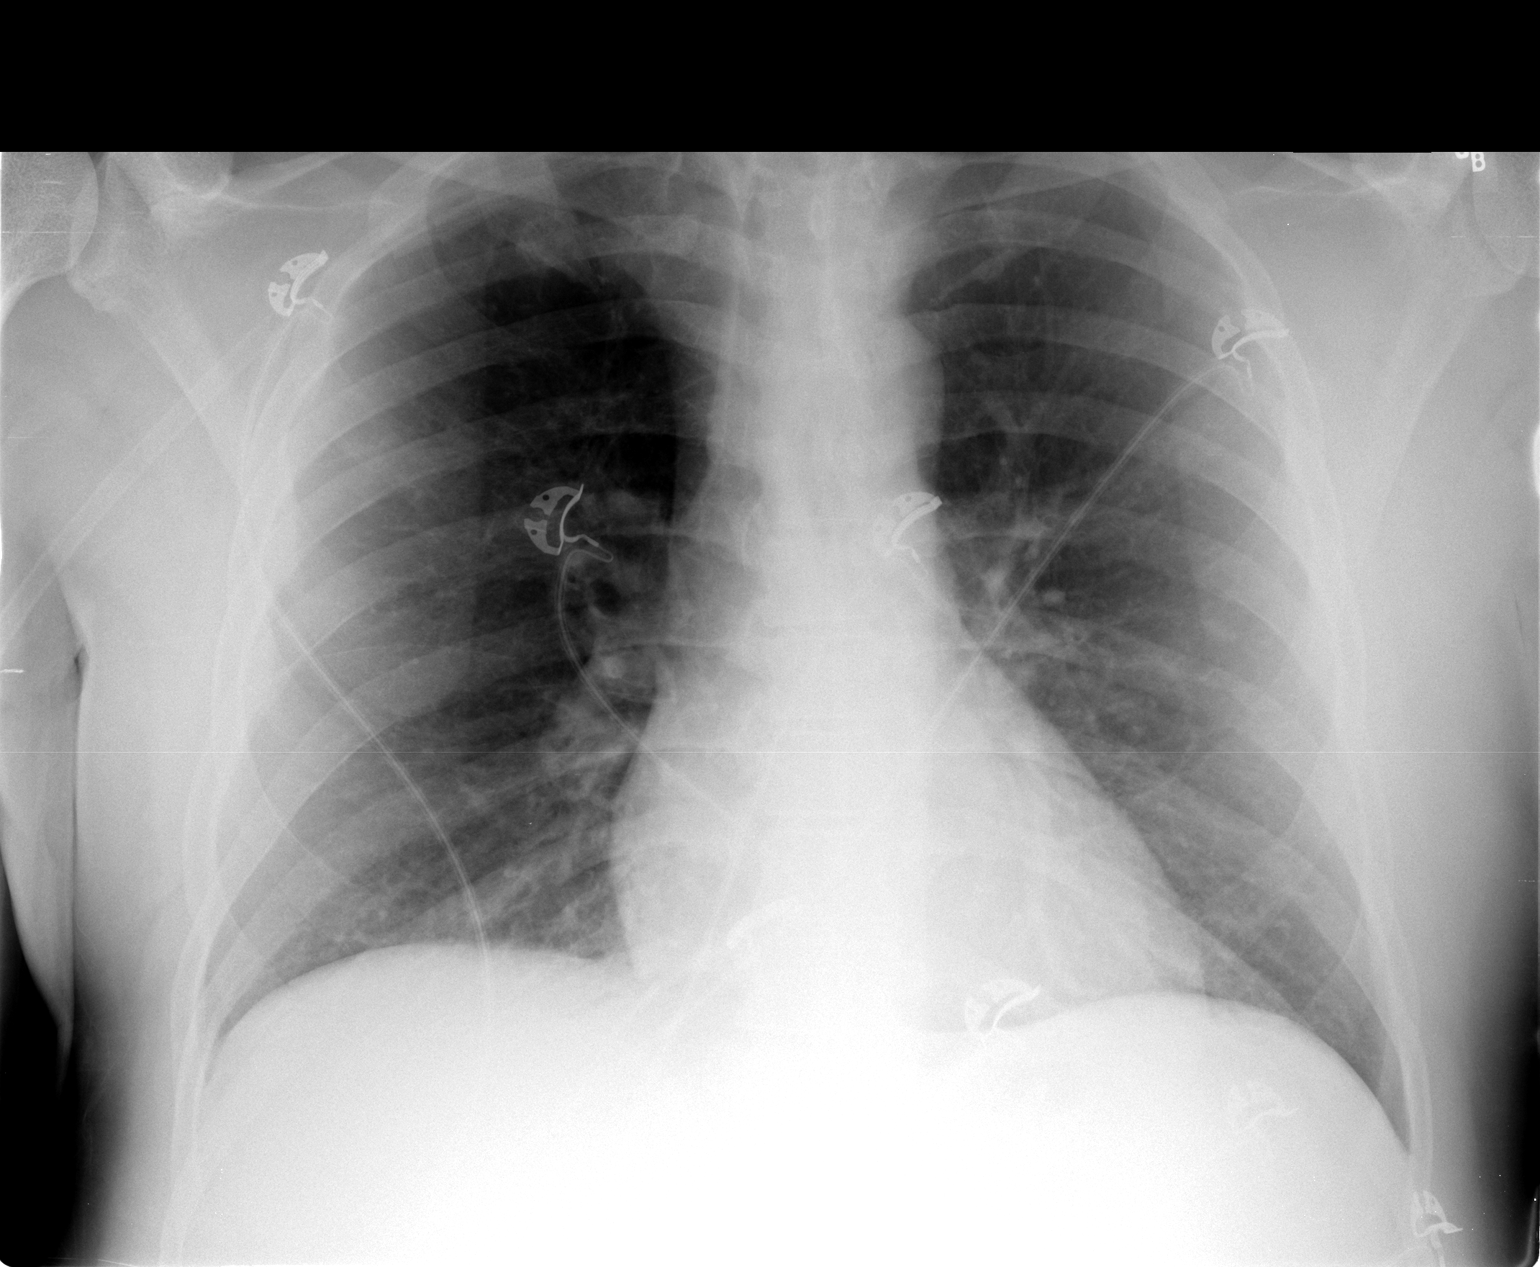

[1 of 1 positions shown; findings below may reference images not displayed]

FINDINGS: Normal heart size, mediastinal contours, and pulmonary vascularity.
Minimal chronic peribronchial thickening.
No pulmonary infiltrate, pleural effusion, or pneumothorax.
IMPRESSION: No acute bony abnormalities.

## 2010-03-10 IMAGING — CT CT ANGIO CHEST
2 of 7 series · 19 of 36 positions shown · IV contrast (APPLIED)
Comparison: 01/02/2008

CLINICAL DATA: Shortness of breath, chest pain, weakness, fever,
history diabetes, hypertension, emphysema

CT ANGIOGRAPHY CHEST
TECHNIQUE: Multidetector CT imaging of the chest was performed
using the standard protocol during bolus administration of
intravenous contrast. Multiplanar CT image reconstructions
including MIPs were obtained to evaluate the vascular anatomy.
Contrast: 80 ml Pmnipaque-O11

[Series 8: pulm embolism 1.0 b25f thins · axial · 0.66mm/px · z∈[-242,-0]mm · 17 of 270 slices shown]
[im 14/270  lung]
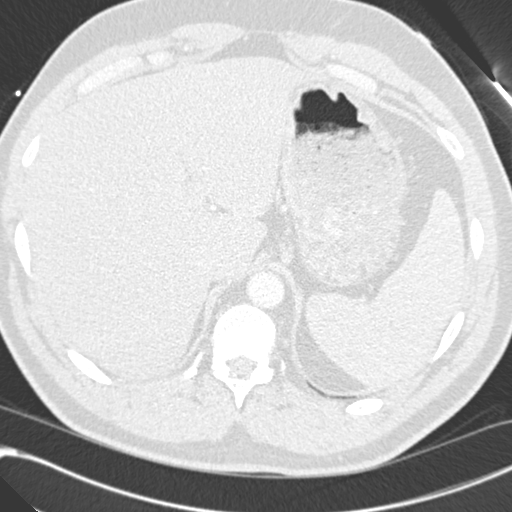
[im 27/270  mediastinal]
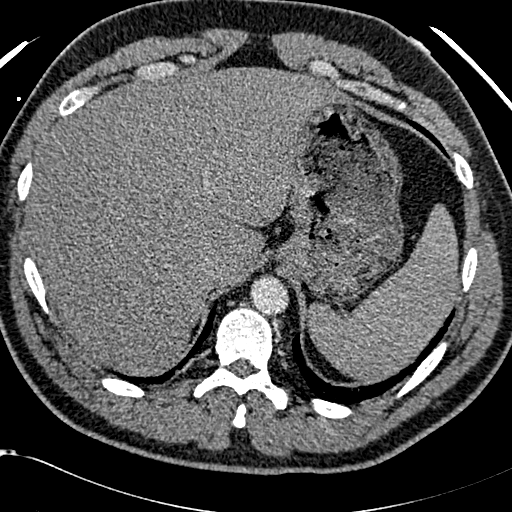
[im 41/270  lung]
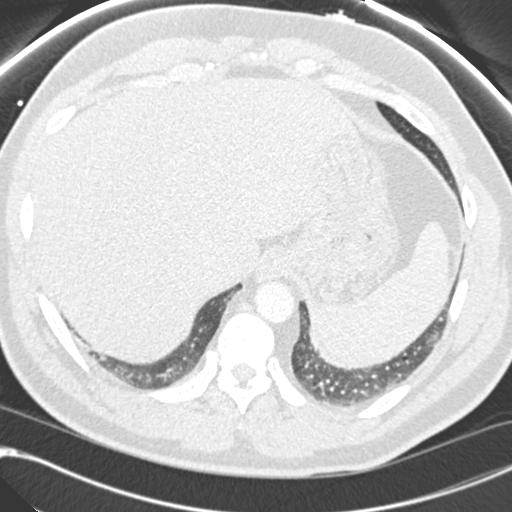
[im 54/270  mediastinal]
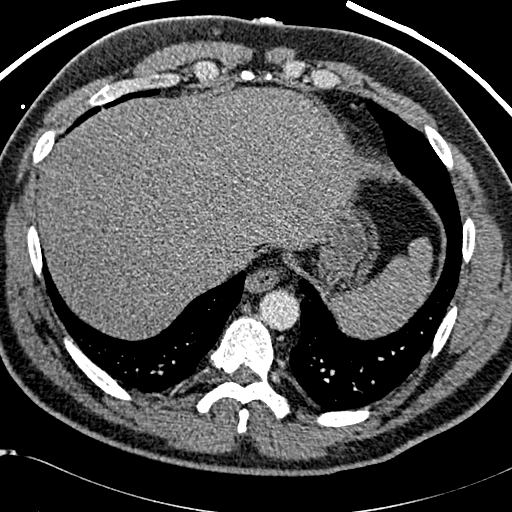
[im 81/270  lung]
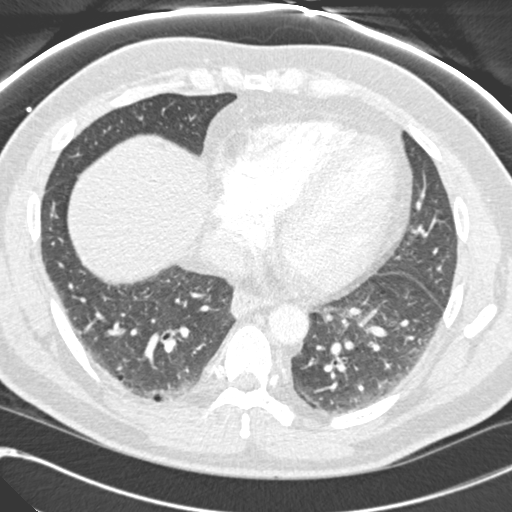
[im 95/270  mediastinal]
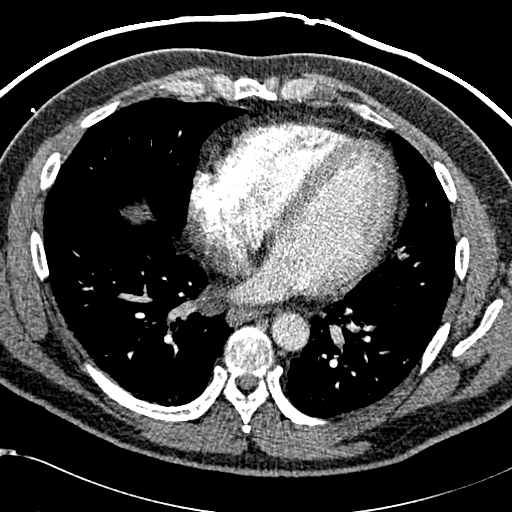
[im 108/270  lung]
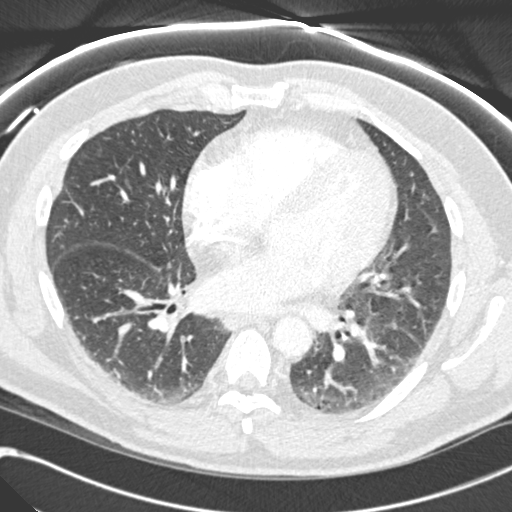
[im 122/270  mediastinal]
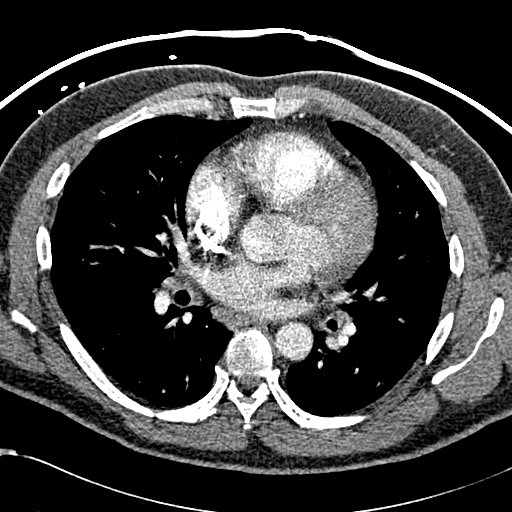
[im 135/270  lung]
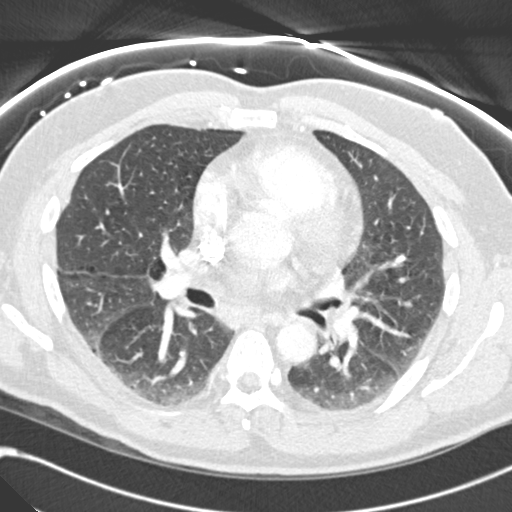
[im 148/270  mediastinal]
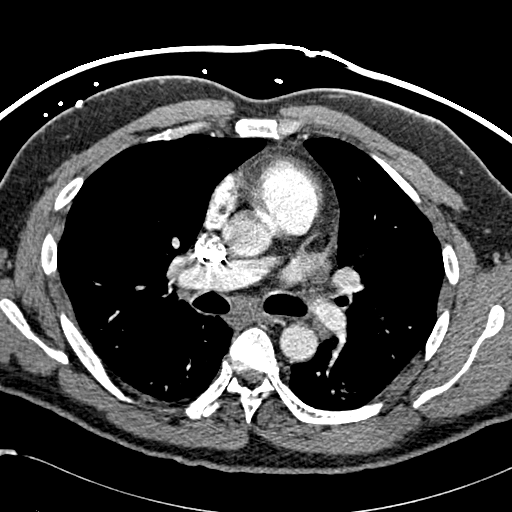
[im 162/270  lung]
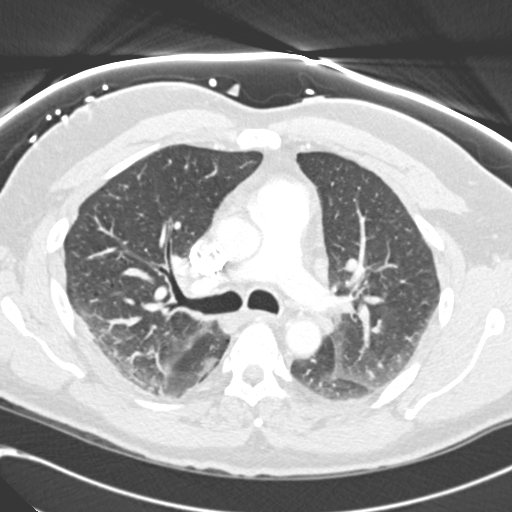
[im 175/270  mediastinal]
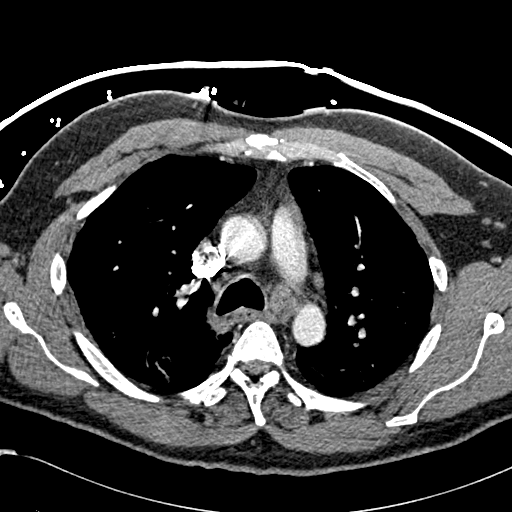
[im 189/270  lung]
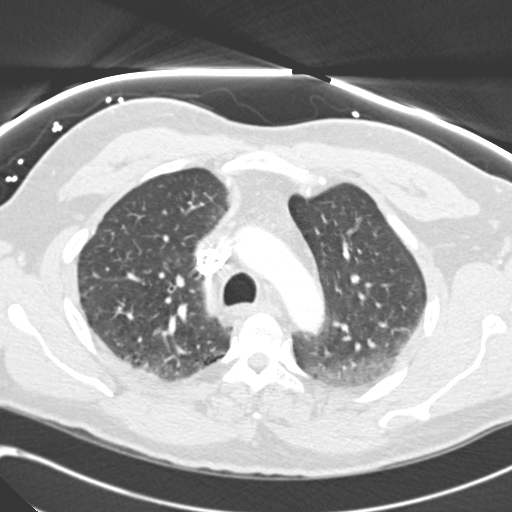
[im 216/270  mediastinal]
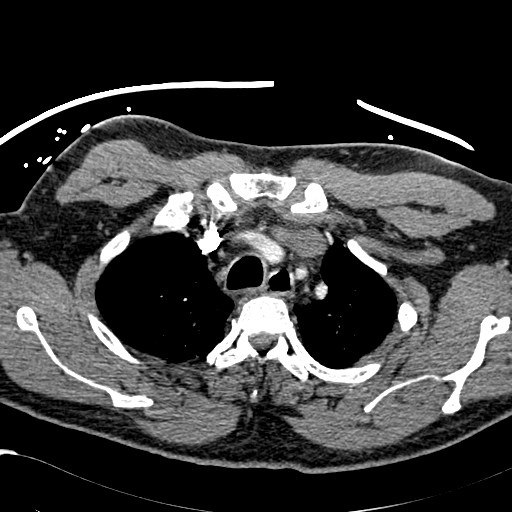
[im 229/270  lung]
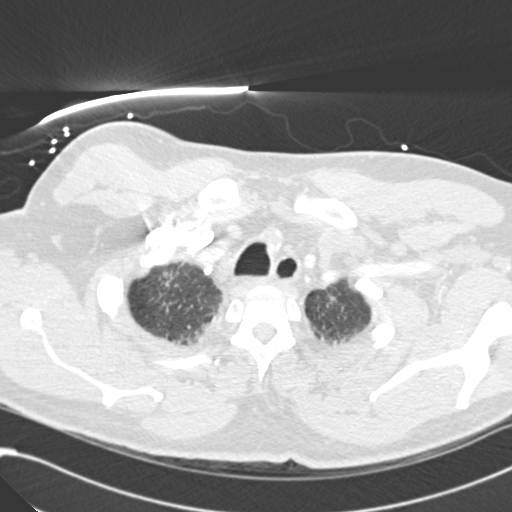
[im 243/270  mediastinal]
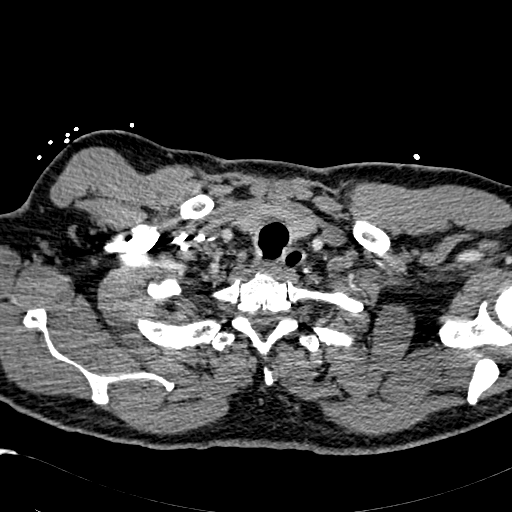
[im 256/270  lung]
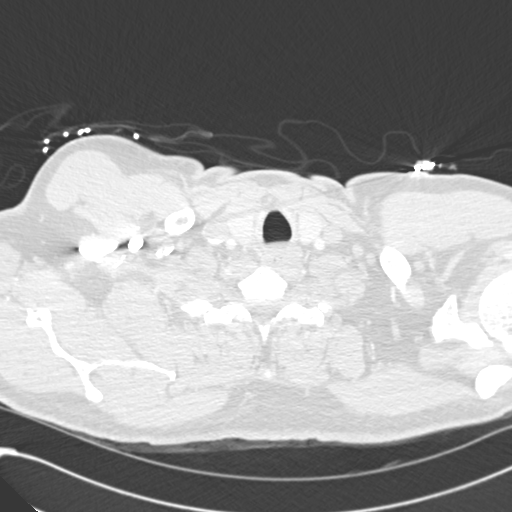

[Series 604: coronal mips · coronal · 0.66mm/px · 2 of 116 slices shown]
[im 39/116  mediastinal]
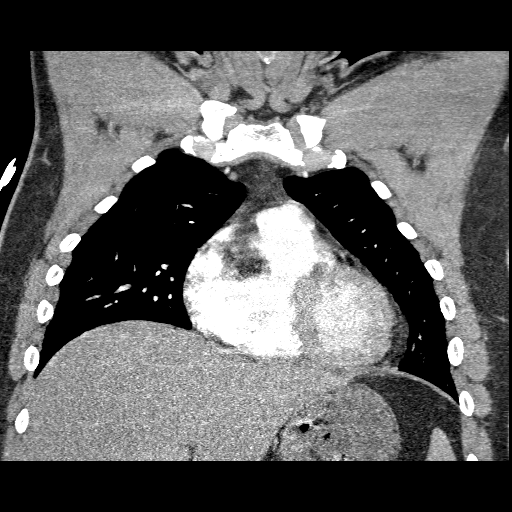
[im 77/116  mediastinal]
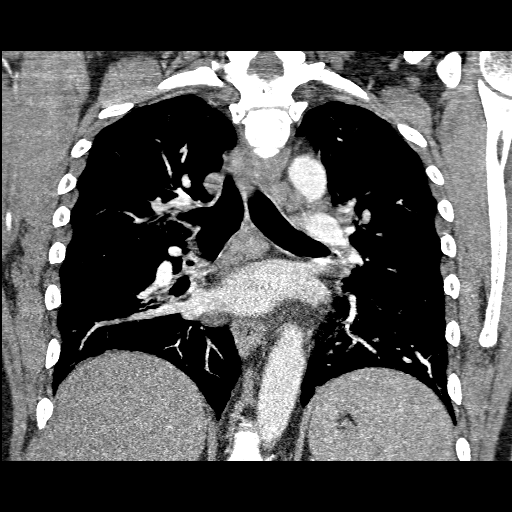

[19 of 36 positions shown; findings below may reference images not displayed]

FINDINGS: Aorta normal caliber without aneurysm or dissection.
Pulmonary arteries patent.
No evidence pulmonary embolism.
Stable minimally enlarged mediastinal lymph nodes, including AP
window nodes 16 x 13 mm and 20 x 17 mm.
Dependent atelectasis bilateral lungs.
Visualized portion of upper abdomen normal.
Scattered peribronchial thickening.
No definite pulmonary infiltrate or pleural effusion.
No acute bony abnormalities.

 Review of the MIP images confirms the above findings.
IMPRESSION: Mildly prominent mediastinal lymph nodes, unchanged from previous
study.
Dependent atelectasis and diffuse bronchitic changes.
No evidence of pulmonary embolism.

## 2010-03-17 ENCOUNTER — Ambulatory Visit: Payer: Self-pay | Admitting: Internal Medicine

## 2010-03-17 ENCOUNTER — Telehealth: Payer: Self-pay | Admitting: Internal Medicine

## 2010-03-17 DIAGNOSIS — J309 Allergic rhinitis, unspecified: Secondary | ICD-10-CM | POA: Insufficient documentation

## 2010-03-17 LAB — CONVERTED CEMR LAB
GC Probe Amp, Urine: NEGATIVE
Hemoglobin, Urine: NEGATIVE
Nitrite: NEGATIVE
Specific Gravity, Urine: >=1.03 (ref 1.000–1.030)
Total Protein, Urine: NEGATIVE mg/dL
Urine Glucose: NEGATIVE mg/dL
Urobilinogen, UA: 1 (ref 0.0–1.0)

## 2010-03-18 ENCOUNTER — Ambulatory Visit: Payer: Self-pay | Admitting: Internal Medicine

## 2010-03-18 LAB — CONVERTED CEMR LAB: Cholesterol, target level: 200 mg/dL

## 2010-04-01 ENCOUNTER — Ambulatory Visit: Payer: Self-pay | Admitting: Cardiovascular Disease

## 2010-04-01 DIAGNOSIS — N529 Male erectile dysfunction, unspecified: Secondary | ICD-10-CM | POA: Insufficient documentation

## 2010-04-02 ENCOUNTER — Telehealth: Payer: Self-pay | Admitting: Internal Medicine

## 2010-04-05 ENCOUNTER — Telehealth: Payer: Self-pay | Admitting: Internal Medicine

## 2010-04-09 ENCOUNTER — Ambulatory Visit (HOSPITAL_COMMUNITY): Payer: Self-pay | Admitting: Psychiatry

## 2010-04-12 ENCOUNTER — Ambulatory Visit (HOSPITAL_COMMUNITY): Payer: Self-pay | Admitting: Licensed Clinical Social Worker

## 2010-04-12 ENCOUNTER — Telehealth: Payer: Self-pay | Admitting: Internal Medicine

## 2010-04-26 ENCOUNTER — Telehealth (INDEPENDENT_AMBULATORY_CARE_PROVIDER_SITE_OTHER): Payer: Self-pay | Admitting: *Deleted

## 2010-04-27 ENCOUNTER — Ambulatory Visit (HOSPITAL_COMMUNITY): Payer: Self-pay | Admitting: Licensed Clinical Social Worker

## 2010-04-28 ENCOUNTER — Telehealth: Payer: Self-pay | Admitting: Internal Medicine

## 2010-04-29 ENCOUNTER — Telehealth: Payer: Self-pay | Admitting: Cardiovascular Disease

## 2010-04-29 ENCOUNTER — Encounter: Payer: Self-pay | Admitting: Cardiovascular Disease

## 2010-04-30 ENCOUNTER — Encounter: Payer: Self-pay | Admitting: Internal Medicine

## 2010-05-04 ENCOUNTER — Telehealth: Payer: Self-pay | Admitting: Internal Medicine

## 2010-05-04 ENCOUNTER — Ambulatory Visit: Payer: Self-pay | Admitting: Internal Medicine

## 2010-05-07 ENCOUNTER — Ambulatory Visit: Payer: Self-pay | Admitting: Internal Medicine

## 2010-05-07 ENCOUNTER — Encounter: Payer: Self-pay | Admitting: Internal Medicine

## 2010-05-07 DIAGNOSIS — R059 Cough, unspecified: Secondary | ICD-10-CM | POA: Insufficient documentation

## 2010-05-07 DIAGNOSIS — R05 Cough: Secondary | ICD-10-CM

## 2010-05-07 LAB — CONVERTED CEMR LAB
ALT: 24 units/L (ref 0–53)
AST: 18 units/L (ref 0–37)
Alkaline Phosphatase: 71 units/L (ref 39–117)
BUN: 10 mg/dL (ref 6–23)
Basophils Absolute: 0 10*3/uL (ref 0.0–0.1)
Bilirubin Urine: NEGATIVE
Bilirubin, Direct: 0.1 mg/dL (ref 0.0–0.3)
Calcium: 9.4 mg/dL (ref 8.4–10.5)
Cholesterol: 99 mg/dL (ref 0–200)
Creatinine, Ser: 0.8 mg/dL (ref 0.4–1.5)
Eosinophils Relative: 1.1 % (ref 0.0–5.0)
GFR calc non Af Amer: 141.61 mL/min (ref >60)
Glucose, Bld: 89 mg/dL (ref 70–99)
HCT: 49.6 % (ref 39.0–52.0)
HDL: 26.6 mg/dL — ABNORMAL LOW (ref >39.00)
Hgb A1c MFr Bld: 6.2 % (ref 4.6–6.5)
Ketones, ur: NEGATIVE mg/dL
LDL Cholesterol: 58 mg/dL (ref 0–99)
Leukocytes, UA: NEGATIVE
Lymphocytes Relative: 27.6 % (ref 12.0–46.0)
Lymphs Abs: 2.4 10*3/uL (ref 0.7–4.0)
Monocytes Relative: 7.1 % (ref 3.0–12.0)
Neutrophils Relative %: 63.8 % (ref 43.0–77.0)
Nitrite: NEGATIVE
Platelets: 119 10*3/uL — ABNORMAL LOW (ref 150.0–400.0)
Potassium: 4.3 meq/L (ref 3.5–5.1)
RDW: 14.9 % — ABNORMAL HIGH (ref 11.5–14.6)
Specific Gravity, Urine: >=1.03 (ref 1.000–1.030)
Total Bilirubin: 0.6 mg/dL (ref 0.3–1.2)
Total Protein, Urine: NEGATIVE mg/dL
VLDL: 14.8 mg/dL (ref 0.0–40.0)
WBC: 8.8 10*3/uL (ref 4.5–10.5)
pH: 5.5 (ref 5.0–8.0)

## 2010-05-10 ENCOUNTER — Telehealth: Payer: Self-pay | Admitting: Internal Medicine

## 2010-05-11 ENCOUNTER — Telehealth: Payer: Self-pay | Admitting: Internal Medicine

## 2010-05-11 ENCOUNTER — Telehealth (INDEPENDENT_AMBULATORY_CARE_PROVIDER_SITE_OTHER): Payer: Self-pay | Admitting: *Deleted

## 2010-05-12 ENCOUNTER — Ambulatory Visit (HOSPITAL_COMMUNITY): Payer: Self-pay | Admitting: Licensed Clinical Social Worker

## 2010-05-12 ENCOUNTER — Telehealth: Payer: Self-pay | Admitting: Internal Medicine

## 2010-05-12 ENCOUNTER — Telehealth: Payer: Self-pay | Admitting: Cardiovascular Disease

## 2010-05-13 ENCOUNTER — Telehealth: Payer: Self-pay | Admitting: Internal Medicine

## 2010-05-19 ENCOUNTER — Telehealth: Payer: Self-pay | Admitting: Cardiovascular Disease

## 2010-06-10 ENCOUNTER — Ambulatory Visit: Payer: Self-pay | Admitting: Internal Medicine

## 2010-06-13 ENCOUNTER — Telehealth: Payer: Self-pay | Admitting: Internal Medicine

## 2010-06-24 ENCOUNTER — Encounter: Payer: Self-pay | Admitting: Internal Medicine

## 2010-07-02 ENCOUNTER — Ambulatory Visit (HOSPITAL_COMMUNITY): Payer: Self-pay | Admitting: Licensed Clinical Social Worker

## 2010-07-06 ENCOUNTER — Telehealth: Payer: Self-pay | Admitting: Cardiovascular Disease

## 2010-07-08 ENCOUNTER — Telehealth: Payer: Self-pay | Admitting: Internal Medicine

## 2010-07-14 ENCOUNTER — Ambulatory Visit (HOSPITAL_COMMUNITY): Payer: Self-pay | Admitting: Licensed Clinical Social Worker

## 2010-07-16 ENCOUNTER — Encounter: Payer: Self-pay | Admitting: Cardiovascular Disease

## 2010-08-03 ENCOUNTER — Ambulatory Visit (HOSPITAL_COMMUNITY): Payer: Self-pay | Admitting: Licensed Clinical Social Worker

## 2010-08-05 ENCOUNTER — Ambulatory Visit: Payer: Self-pay | Admitting: Internal Medicine

## 2010-08-16 ENCOUNTER — Ambulatory Visit (HOSPITAL_COMMUNITY): Payer: Self-pay | Admitting: Licensed Clinical Social Worker

## 2010-08-25 ENCOUNTER — Encounter: Payer: Self-pay | Admitting: Internal Medicine

## 2010-09-01 ENCOUNTER — Ambulatory Visit (HOSPITAL_COMMUNITY): Payer: Self-pay | Admitting: Licensed Clinical Social Worker

## 2010-09-03 ENCOUNTER — Telehealth: Payer: Self-pay | Admitting: Internal Medicine

## 2010-09-06 ENCOUNTER — Telehealth: Payer: Self-pay | Admitting: Internal Medicine

## 2010-09-08 ENCOUNTER — Ambulatory Visit (HOSPITAL_COMMUNITY): Payer: Self-pay | Admitting: Licensed Clinical Social Worker

## 2010-09-08 ENCOUNTER — Telehealth: Payer: Self-pay | Admitting: Internal Medicine

## 2010-09-15 ENCOUNTER — Ambulatory Visit (HOSPITAL_COMMUNITY): Payer: Self-pay | Admitting: Licensed Clinical Social Worker

## 2010-09-21 ENCOUNTER — Ambulatory Visit (HOSPITAL_COMMUNITY): Payer: Self-pay | Admitting: Psychiatry

## 2010-09-22 ENCOUNTER — Ambulatory Visit (HOSPITAL_COMMUNITY): Payer: Self-pay | Admitting: Licensed Clinical Social Worker

## 2010-09-23 ENCOUNTER — Ambulatory Visit: Admit: 2010-09-23 | Payer: Self-pay | Admitting: Internal Medicine

## 2010-10-04 ENCOUNTER — Encounter: Payer: Self-pay | Admitting: Internal Medicine

## 2010-10-04 LAB — HM DIABETES EYE EXAM

## 2010-10-05 ENCOUNTER — Ambulatory Visit
Admission: RE | Admit: 2010-10-05 | Discharge: 2010-10-05 | Payer: Self-pay | Source: Home / Self Care | Attending: Internal Medicine | Admitting: Internal Medicine

## 2010-10-05 DIAGNOSIS — N342 Other urethritis: Secondary | ICD-10-CM | POA: Insufficient documentation

## 2010-10-07 ENCOUNTER — Ambulatory Visit (HOSPITAL_COMMUNITY): Admit: 2010-10-07 | Payer: Self-pay | Admitting: Licensed Clinical Social Worker

## 2010-10-07 ENCOUNTER — Other Ambulatory Visit: Payer: Self-pay | Admitting: Internal Medicine

## 2010-10-07 ENCOUNTER — Encounter: Payer: Self-pay | Admitting: Internal Medicine

## 2010-10-07 LAB — HEPATIC FUNCTION PANEL
ALT: 27 U/L (ref 0–53)
AST: 22 U/L (ref 0–37)
Albumin: 4.2 g/dL (ref 3.5–5.2)
Alkaline Phosphatase: 74 U/L (ref 39–117)
Bilirubin, Direct: 0.1 mg/dL (ref 0.0–0.3)
Total Bilirubin: 0.6 mg/dL (ref 0.3–1.2)
Total Protein: 7 g/dL (ref 6.0–8.3)

## 2010-10-07 LAB — CBC WITH DIFFERENTIAL/PLATELET
Basophils Absolute: 0 10*3/uL (ref 0.0–0.1)
Basophils Relative: 0.6 % (ref 0.0–3.0)
Eosinophils Absolute: 0.1 10*3/uL (ref 0.0–0.7)
Eosinophils Relative: 1.4 % (ref 0.0–5.0)
HCT: 47.4 % (ref 39.0–52.0)
Hemoglobin: 16.5 g/dL (ref 13.0–17.0)
Lymphocytes Relative: 32.6 % (ref 12.0–46.0)
Lymphs Abs: 2.6 10*3/uL (ref 0.7–4.0)
MCHC: 34.8 g/dL (ref 30.0–36.0)
MCV: 92.7 fl (ref 78.0–100.0)
Monocytes Absolute: 0.6 10*3/uL (ref 0.1–1.0)
Monocytes Relative: 7 % (ref 3.0–12.0)
Neutro Abs: 4.7 10*3/uL (ref 1.4–7.7)
Neutrophils Relative %: 58.4 % (ref 43.0–77.0)
Platelets: 122 10*3/uL — ABNORMAL LOW (ref 150.0–400.0)
RBC: 5.11 Mil/uL (ref 4.22–5.81)
RDW: 14.7 % — ABNORMAL HIGH (ref 11.5–14.6)
WBC: 8 10*3/uL (ref 4.5–10.5)

## 2010-10-07 LAB — BASIC METABOLIC PANEL
BUN: 10 mg/dL (ref 6–23)
CO2: 25 mEq/L (ref 19–32)
Calcium: 9 mg/dL (ref 8.4–10.5)
Chloride: 106 mEq/L (ref 96–112)
Creatinine, Ser: 0.8 mg/dL (ref 0.4–1.5)
GFR: 129.36 mL/min (ref >60.00)
Glucose, Bld: 88 mg/dL (ref 70–99)
Potassium: 4.3 mEq/L (ref 3.5–5.1)
Sodium: 138 mEq/L (ref 135–145)

## 2010-10-07 LAB — URINALYSIS, ROUTINE W REFLEX MICROSCOPIC
Bilirubin Urine: NEGATIVE
Hemoglobin, Urine: NEGATIVE
Ketones, ur: NEGATIVE
Leukocytes, UA: NEGATIVE
Nitrite: NEGATIVE
Specific Gravity, Urine: 1.015 (ref 1.000–1.030)
Total Protein, Urine: NEGATIVE
Urine Glucose: NEGATIVE
Urobilinogen, UA: 0.2 (ref 0.0–1.0)
pH: 5.5 (ref 5.0–8.0)

## 2010-10-07 LAB — LIPID PANEL
Cholesterol: 132 mg/dL (ref 0–200)
HDL: 29.6 mg/dL — ABNORMAL LOW (ref >39.00)
LDL Cholesterol: 80 mg/dL (ref 0–99)
Total CHOL/HDL Ratio: 4
Triglycerides: 111 mg/dL (ref 0.0–149.0)
VLDL: 22.2 mg/dL (ref 0.0–40.0)

## 2010-10-07 LAB — HEMOGLOBIN A1C: Hgb A1c MFr Bld: 6.3 % (ref 4.6–6.5)

## 2010-10-07 LAB — TSH: TSH: 0.79 u[IU]/mL (ref 0.35–5.50)

## 2010-10-08 ENCOUNTER — Telehealth: Payer: Self-pay | Admitting: Internal Medicine

## 2010-10-13 ENCOUNTER — Ambulatory Visit: Admit: 2010-10-13 | Payer: Self-pay | Admitting: Cardiovascular Disease

## 2010-10-13 ENCOUNTER — Encounter: Payer: Self-pay | Admitting: Cardiovascular Disease

## 2010-10-15 ENCOUNTER — Ambulatory Visit (HOSPITAL_COMMUNITY): Admit: 2010-10-15 | Payer: Self-pay | Admitting: Licensed Clinical Social Worker

## 2010-10-16 IMAGING — CR DG CHEST 2V
2 series · 2 of 2 positions shown · non-contrast
Comparison: 03/09/2009

CLINICAL DATA: Pre arteriogram.  Shortness of breath.

CHEST - 2 VIEW

[w chest pa]
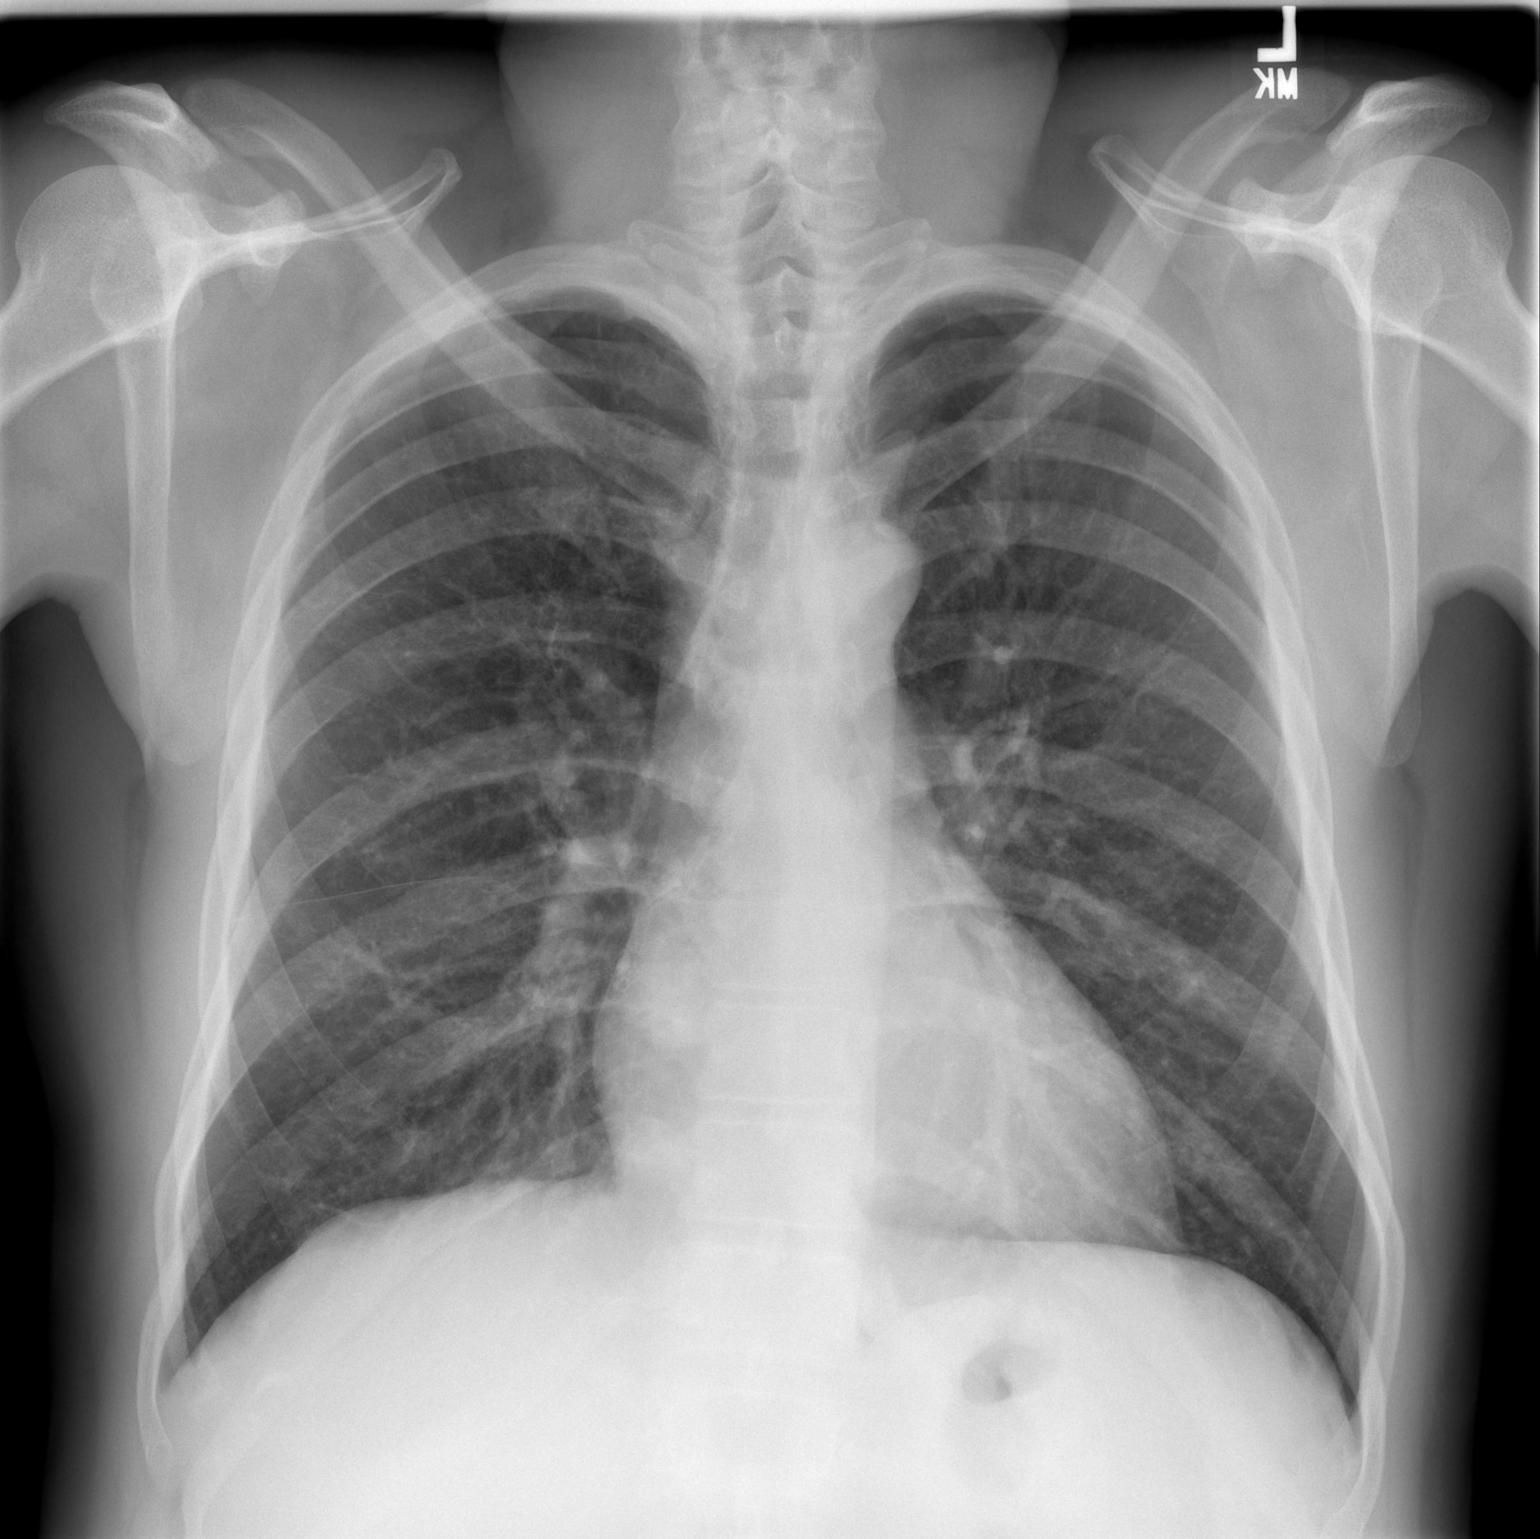

[w chest lat]
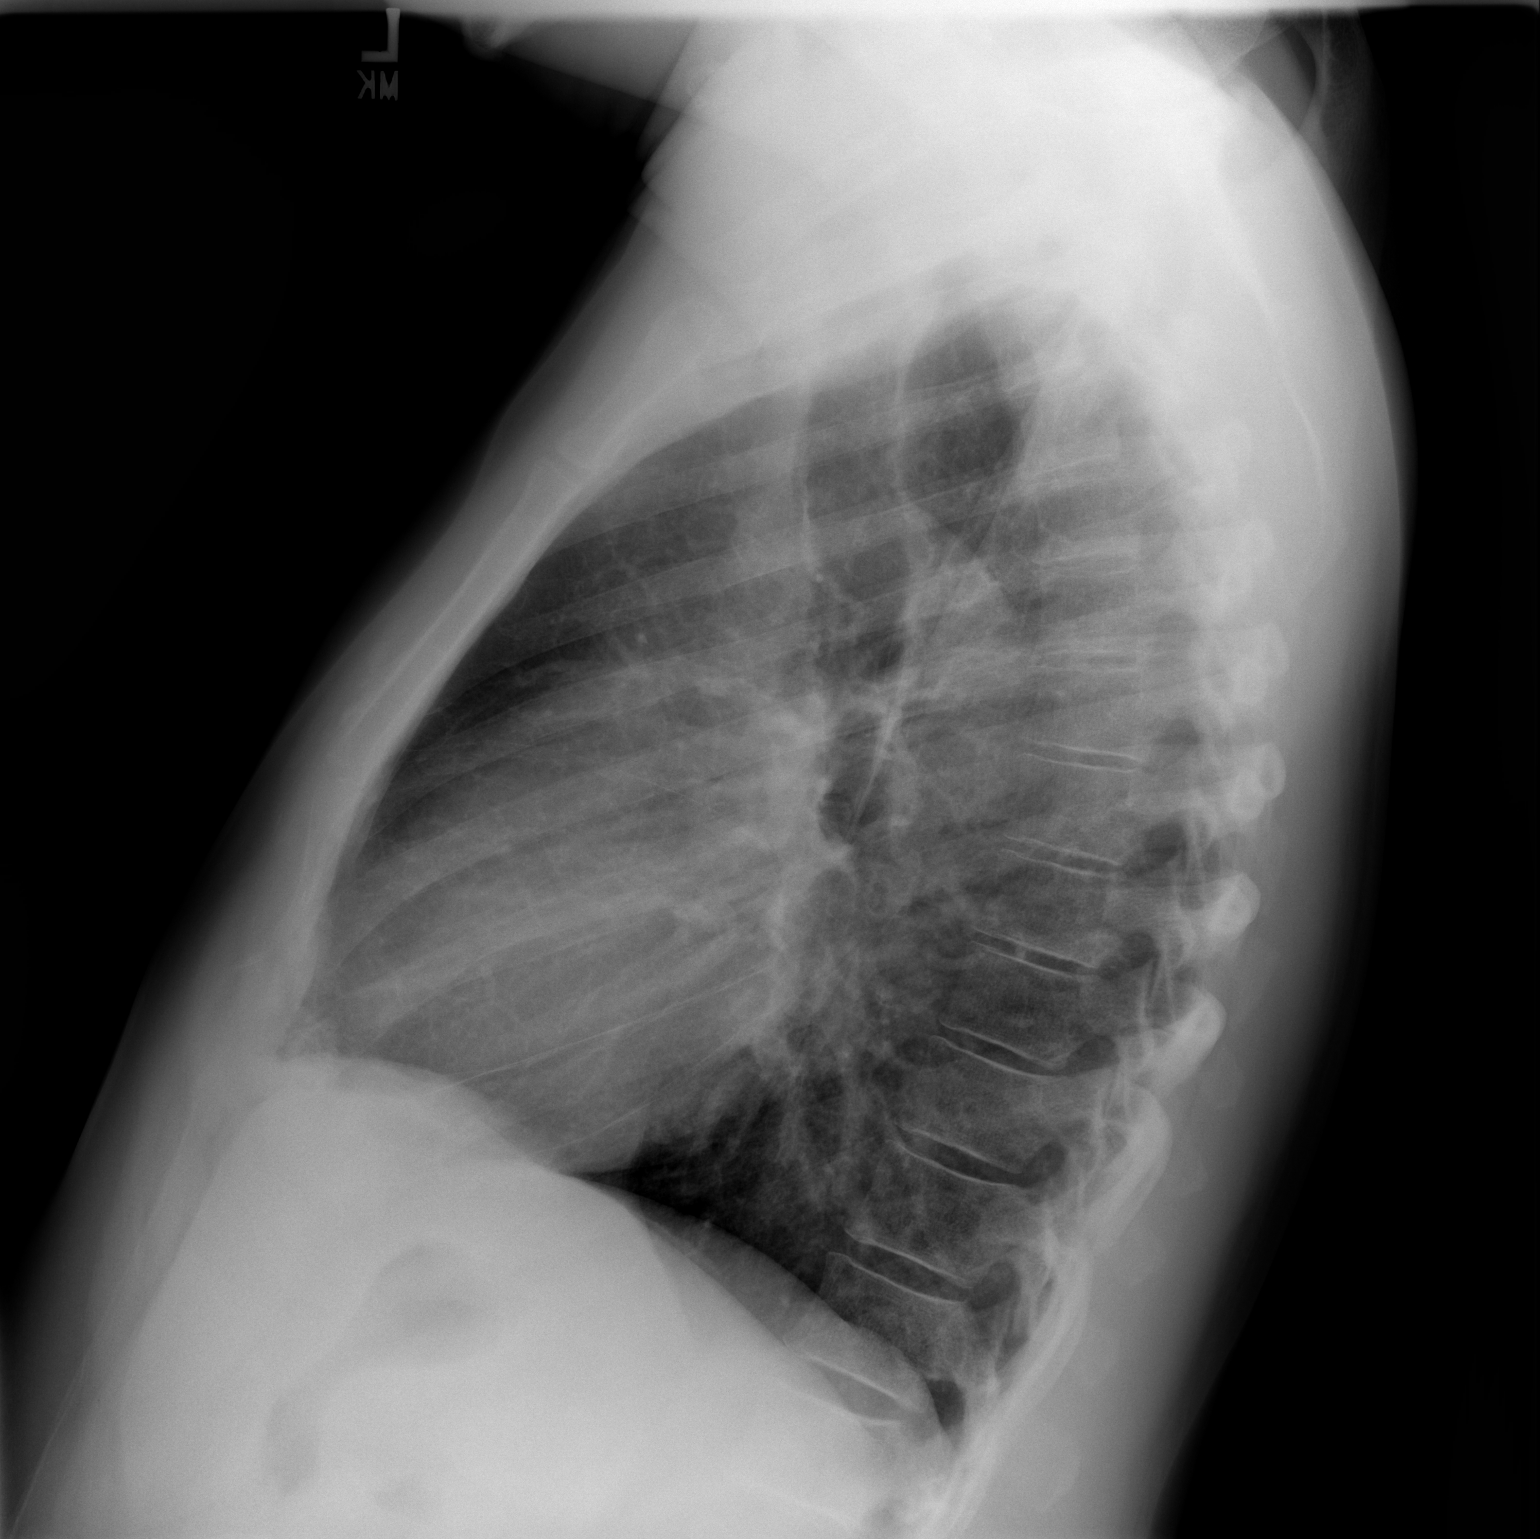

[2 of 2 positions shown; findings below may reference images not displayed]

FINDINGS: Trachea is midline.  Heart size within normal limits.
Minimal biapical pleural thickening.  There may be mild scarring at
the right lung base.  Lungs are otherwise clear.  No pleural fluid.
IMPRESSION: No acute findings.

## 2010-10-17 ENCOUNTER — Encounter: Payer: Self-pay | Admitting: Internal Medicine

## 2010-10-20 ENCOUNTER — Ambulatory Visit
Admission: RE | Admit: 2010-10-20 | Discharge: 2010-10-20 | Payer: Self-pay | Source: Home / Self Care | Attending: Internal Medicine | Admitting: Internal Medicine

## 2010-10-20 ENCOUNTER — Ambulatory Visit (HOSPITAL_COMMUNITY)
Admission: RE | Admit: 2010-10-20 | Discharge: 2010-10-20 | Payer: Self-pay | Source: Home / Self Care | Attending: Licensed Clinical Social Worker | Admitting: Licensed Clinical Social Worker

## 2010-10-20 ENCOUNTER — Encounter: Payer: Self-pay | Admitting: Internal Medicine

## 2010-10-25 ENCOUNTER — Telehealth: Payer: Self-pay | Admitting: Internal Medicine

## 2010-10-26 ENCOUNTER — Ambulatory Visit (HOSPITAL_COMMUNITY): Admit: 2010-10-26 | Payer: Self-pay | Admitting: Psychiatry

## 2010-10-26 NOTE — Progress Notes (Signed)
Summary: summit study  Phone Note Outgoing Call   Summary of Call: called patient to seee if interested in SUMMIT study. left message to call back research office [phone] and ask for Randye Lobo or [place] Initial call taken by: Kalman Shan MD,  June 13, 2010 9:16 PM

## 2010-10-26 NOTE — Assessment & Plan Note (Signed)
Summary: per check out/saf  Medications Added FLUCONAZOLE 200 MG TABS (FLUCONAZOLE) One by mouth once daily for 30 days LAMISIL 250 MG TABS (TERBINAFINE HCL) 1 tab two times a day VIAGRA 50 MG TABS (SILDENAFIL CITRATE) take as needed as directed        Visit Type:  6 mo f/u Referring Provider:  DR, Yetta Barre Primary Provider:  Etta Grandchild MD  CC:  no cardiac complaints today.  History of Present Illness: 50 yo AAM with DM, HTN, hyperlipidemia, NICM with EF of 25% and normal coronary arteries by cath 2008, PVD and tobacco abuse here today for cardiac and PV follow up. 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 Allyson Sabal 07/28/09. He was unhappy with his care in their office and wishes to change to [facility] office. He stopped taking his Plavix three weeks after his stent was placed because of nose bleeds. He also does not take ASA because it makes his nose bleed.   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 Dr. Allyson Sabal stented the left iliac artery and since then, he has had pain in his left calf muscle with ambulation. Records indicate left thigh claudication prior to the procedure.  He gets cramping in the left calf with walking and this eases off with rest. Sometimes he has rest pain in both legs. His history is vague. No SOB or CP. He continues to smoke daily. He is interested in smoking cessation and has enrolled in the cessation class at [facility] in two weeks.   He has not been having cramps in his legs when walking. He has no rest pain and no ulcerations. He has been walking one hour per day and has had no chest pain or SOB. Arterial dopplers in February 2011 with normal ABI bilaterally and no evidence of flow limiting disease. No near syncope or syncope. He has been seen by Dr Marchelle Gearing and he tells me that his lungs are doing well. He is treated with inhalers for COPD. He has been having problems  with achieving and maintaining an erection.   Current Medications (verified): 1)  Paxil 30 Mg Tabs (Paroxetine Hcl) .... Take 2 Tablet By Mouth Once A Day 2)  Trazodone Hcl 100 Mg Tabs (Trazodone Hcl) .... Take 3 Tab By Mouth At Bedtime 3)  Coreg 12.5 Mg Tabs (Carvedilol) .... Take 1 Tablet By Mouth Once Daily 4)  Xanax 1 Mg Tabs (Alprazolam) .Marland Kitchen.. 1 Tab By Mouth Every 8 Hours As Needed 5)  Ventolin Hfa 108 (90 Base) Mcg/act Aers (Albuterol Sulfate) .... Inhale 2 Puffs Every Four Hours As Needed 6)  Albuterol Sulfate (2.5 Mg/15ml) 0.083% Nebu (Albuterol Sulfate) .Marland Kitchen.. 1vial Via Neb Every 4 Hrs As Needed 7)  Vicodin Hp 10-660 Mg Tabs (Hydrocodone-Acetaminophen) .... One By Mouth Three Times A Day As Needed For Pain 8)  Fluconazole 200 Mg Tabs (Fluconazole) .... One By Mouth Once Daily For 30 Days 9)  Doxycycline Hyclate 100 Mg Tabs (Doxycycline Hyclate) .... One By Mouth Two Times A Day For 30 Days 10)  Symbicort 160-4.5 Mcg/act Aero (Budesonide-Formoterol Fumarate) .... Inhale 2 Puffs Two Times A Day 11)  Crestor 20 Mg Tabs (Rosuvastatin Calcium) .... One By Mouth Once Daily For Cholesterol 12)  Aciphex 20 Mg Tbec (Rabeprazole Sodium) .... Once Daily For Acid Reflux 13)  Fluticasone Propionate 50 Mcg/act Susp (Fluticasone Propionate) .... 2 Squirts Each Nostril  Bid 14)  Lamisil 250 Mg Tabs (Terbinafine Hcl) .Marland Kitchen.. 1 Tab Two Times A Day  Allergies: 1)  ! Ace Inhibitors 2)  ! Pcn 3)  ! * Blood Thiners 4)  ! Plavix (Clopidogrel Bisulfate)  Past History:  Past Medical History: Reviewed history from 10/02/2009 and no changes required. 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 Alcohol use-no Drug use-no Regular exercise-no Illicit Drug Use - no  Review of Systems  The patient denies fatigue, malaise, fever, weight gain/loss, vision loss, decreased hearing, hoarseness, chest pain, palpitations, shortness of breath, prolonged cough, wheezing, sleep apnea, coughing up blood, abdominal pain, blood in stool, nausea, vomiting, diarrhea, heartburn, incontinence, blood in urine, muscle weakness, joint pain, leg swelling, rash, skin lesions, headache, fainting, dizziness, depression, anxiety, enlarged lymph nodes, easy bruising or bleeding, and environmental allergies.         Impotence.   Vital Signs:  Patient profile:   50 year old male Height:      75 inches Weight:      224 pounds BMI:     28.10 Pulse rate:   78 / minute Pulse rhythm:   regular BP sitting:   100 / 60  (left arm) Cuff size:   large  Vitals Entered By: Danielle Rankin, CMA (April 01, 2010 8:48 AM)  Physical Exam  General:  General: Well developed, well nourished, NAD HEENT: OP clear, mucus membranes moist Musculoskeletal: Muscle strength 5/5 all ext Psychiatric: Mood and affect normal Neck: No JVD, no carotid bruits, no thyromegaly, no lymphadenopathy. Lungs:Clear bilaterally, no wheezes, rhonci, crackles CV: RRR no murmurs, gallops rubs Abdomen: soft, NT, ND, BS present Extremities: No edema, pulses 1-2+ in bilateral DP/PT and radial arteries.      Impression & Recommendations:  Problem # 1:  PVD  (ICD-443.9) Stable. No claudication. H/O left iliac artery stent. Will repeat ABI in 6 months. Continue daily walking.   Problem # 2:  TOBACCO ABUSE (ICD-305.1) Pt wishes to stop and plans on attending a cessation class next week.   Problem # 3:  ERECTILE DYSFUNCTION, ORGANIC (ICD-607.84) Will give trial of Viagra.   Patient Instructions: 1)  Your physician recommends that you schedule a follow-up appointment in: 6 months 2)  Your physician has recommended you make the following change in your medication: Start Viagra 50 mg by mouth as directed as needed. 3)  Your physician has requested that you have an ankle brachial index (ABI). During this test an ultrasound and blood pressure cuff are used to evaluate the arteries that supply the arms and legs with blood. Allow thirty minutes for this exam. There are no restrictions or special instructions. To be done in 6 months Prescriptions: VIAGRA 50 MG TABS (SILDENAFIL CITRATE) take as needed as directed  #10 x 1   Entered by:   Dossie Arbour, RN, BSN   Authorized by:   Verne Carrow, MD   Signed  by:   Dossie Arbour, RN, BSN on 04/01/2010   Method used:   Electronically to        [facility] (retail)       [address]       [place], [place]  [id number]       Ph: [phone] or [phone]       Fax: [phone]   RxID:   [id number]

## 2010-10-26 NOTE — Letter (Signed)
Summary: [facility]  [facility]   Imported By: Sherian Rein 11/10/2009 07:21:10  _____________________________________________________________________  External Attachment:    Type:   Image     Comment:   External Document

## 2010-10-26 NOTE — Progress Notes (Signed)
Summary: talk to nurse  Phone Note Call from Patient Call back at [phone]   Caller: Patient Call For: ramaswamy Reason for Call: Talk to Nurse Summary of Call: Wants to switch his symbicort to something else, pls advise.//[facility] Initial call taken by: Darletta Moll,  April 26, 2010 4:16 PM  Follow-up for Phone Call        Spoke with pt.  He states that he feels that the symbicort is not helping with breathing as much as the spiriva did.  He states that he has still been taking the symbicort and does not see any improvement in his breathing.  He denies any other issues, states that he does not feel sick.  He says that he was taken off of spiriva b/c of his cough, but he feels that the cough was coming from "too much smoking"- and now has "cut way back on smoking" and would like to try spiriva again.  Pls advise thanks! Follow-up by: Vernie Murders,  April 26, 2010 5:20 PM  Additional Follow-up for Phone Call Additional follow up Details #1::        ok switch to spiriva 1 puff daily dc symbicort Additional Follow-up by: Kalman Shan MD,  April 26, 2010 5:44 PM    Additional Follow-up for Phone Call Additional follow up Details #2::    Called, spoke with pt.  Pt advised to stop symbicort and start spiriva.  He verbalized understanding of instructions.  3 mo supply of spiriva sent to [facility]--pt aware.   Follow-up by: Gweneth Dimitri RN,  April 26, 2010 5:48 PM  New/Updated Medications: SPIRIVA HANDIHALER 18 MCG CAPS (TIOTROPIUM BROMIDE MONOHYDRATE) 1 inhalation once daily Prescriptions: SPIRIVA HANDIHALER 18 MCG CAPS (TIOTROPIUM BROMIDE MONOHYDRATE) 1 inhalation once daily  #90 x 1   Entered by:   Gweneth Dimitri RN   Authorized by:   Kalman Shan MD   Signed by:   Gweneth Dimitri RN on 04/26/2010   Method used:   Faxed to ...       [facility]       [address]       [place], [place]         Ph: [phone]       Fax: [phone]  RxID:   [id number]

## 2010-10-26 NOTE — Assessment & Plan Note (Signed)
Summary: ALLERGIES///kp   Copy to:  DR, Yetta Barre Primary Provider/Referring Provider:  Etta Grandchild MD  CC:  Allergy Consult-Dr. Marchelle Gearing.  History of Present Illness: May 04, 2010- 49 yoM smoker seen at kind request of Dr Marchelle Gearing for allergy evaluation.  He has smoked as much as 3 PPD, now down to 1.5 PPD, Starting about a year ago he became bothered by watery rhinorhea, nasal congestion, postnasal drip, itching and tight feeling in throat. He has lived locally all his life with no major recent changes and n o obvious reason why symptoms have started now. Symptoms perennial, but he says worse in mid-summer heat. Snorting is new, reflecting stuffiness lying down. Using Flonase two times a day. Tried zyrtec, allegra, breathe-right strips. None seemed much help. No hx ENT surgery. No unusual reactions to insects, animals, foods or hx of skin rashes. Triggers: heat, wind, grass mowing.  Preventive Screening-Counseling & Management  Alcohol-Tobacco     Alcohol drinks/day: 0     Smoking Status: current     Smoking Cessation Counseling: yes     Smoke Cessation Stage: contemplative     Packs/Day: 1.0 - 1.5     Year Started: 1974     Cans of tobacco/week: no     Passive Smoke Exposure: no     Tobacco Counseling: to quit use of tobacco products  Current Medications (verified): 1)  Paxil 30 Mg Tabs (Paroxetine Hcl) .... Take 2 Tablet By Mouth Once A Day 2)  Trazodone Hcl 100 Mg Tabs (Trazodone Hcl) .... Take 3 Tab By Mouth At Bedtime 3)  Coreg 12.5 Mg Tabs (Carvedilol) .Marland Kitchen.. 1 Tab Two Times A Day 4)  Xanax 1 Mg Tabs (Alprazolam) .Marland Kitchen.. 1 Tab By Mouth Every 8 Hours As Needed 5)  Proair Hfa 108 (90 Base) Mcg/act Aers (Albuterol Sulfate) .... 2 Puffs Every 4-6 Hours As Needed For Sob 6)  Albuterol Sulfate (2.5 Mg/53ml) 0.083% Nebu (Albuterol Sulfate) .Marland Kitchen.. 1vial Via Neb Every 4 Hrs As Needed 7)  Vicodin Hp 10-660 Mg Tabs (Hydrocodone-Acetaminophen) .... One By Mouth Three Times A Day As Needed  For Pain 8)  Spiriva Handihaler 18 Mcg Caps (Tiotropium Bromide Monohydrate) .Marland Kitchen.. 1 Inhalation Once Daily 9)  Crestor 20 Mg Tabs (Rosuvastatin Calcium) .... One By Mouth Once Daily For Cholesterol 10)  Aciphex 20 Mg Tbec (Rabeprazole Sodium) .... Once Daily For Acid Reflux 11)  Lamisil 250 Mg Tabs (Terbinafine Hcl) .Marland Kitchen.. 1 Tab Two Times A Day 12)  Viagra 50 Mg Tabs (Sildenafil Citrate) .... Take As Needed As Directed 13)  Element Control Normal Liqd (Blood Glucose Calibration) .... Check Meter With Each New Vial of Test Strips or Irregular Readings 14)  Element Test  Strp (Glucose Blood) .... Test Once Daily 15)  Lancets  Misc (Lancets) .... Test Once Daily 16)  Lancing Device  Misc (Lancet Devices) .... Teast Once Daily 17)  Element Auto System .... Test Once Daily 18)  Flonase 50 Mcg/act Susp (Fluticasone Propionate) .Marland Kitchen.. 1-2 Sprays in Each Nostril Two Times A Day  Allergies (verified): 1)  ! Ace Inhibitors 2)  ! Pcn 3)  ! * Blood Thiners 4)  ! 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 Alcohol use-no Drug use-no Regular exercise-no Illicit Drug Use - no  Review of Systems      See HPI  The patient complains of non-productive cough, acid heartburn, indigestion, headaches, nasal congestion/difficulty breathing through nose, sneezing, ear ache, anxiety, depression, and joint stiffness or pain.  The patient denies shortness of breath with activity, shortness of breath at rest, productive cough, coughing up blood, chest pain, irregular heartbeats, loss of appetite, weight change, abdominal pain, difficulty swallowing, sore throat, tooth/dental problems, itching, hand/feet swelling, rash, change in color of mucus, and fever.    Vital Signs:  Patient profile:   50 year old male Height:      75 inches Weight:      221.50  pounds BMI:     27.79 O2 Sat:      100 % on Room air Pulse rate:   70 / minute BP sitting:   120 / 68  (right arm) Cuff size:   regular  Vitals Entered By: Reynaldo Minium CMA (May 04, 2010 9:06 AM)  O2 Flow:  Room air CC: Allergy Consult-Dr. Marchelle Gearing   Physical Exam  Additional Exam:  General: A/Ox3; pleasant and cooperative, NAD, tall, lean man, intelligent SKIN: no rash, lesions NODES: no lymphadenopathy HEENT: [place]/AT, EOM- WNL, Conjuctivae- clear, PERRLA, TM-WNL, Nose- clear mucus bridging, Throat- clear and wnl, Mallampati II. No drainage or erythema. NECK: Supple w/ fair ROM, JVD- none, normal carotid impulses w/o bruits Thyroid- normal to palpation CHEST: Clear to P&A HEART: RRR, no m/g/r heard ABDOMEN: Soft and nl; nml bowel sounds; no organomegaly or masses noted QVZ:DGLO, nl pulses, no edema  NEURO: Grossly intact to observation      Impression & Recommendations:  Problem # 1:  ALLERGIC RHINITIS CAUSE UNSPECIFIED (ICD-477.9) Lack of response to antihistamines raises question, but throat itching suggests allergy. This may be mostly irritant rhinitis from summer air quality and his smoking. Try throat lozenges and astepro for throat itching  We will send IgE survey anbd return for skin testing. His updated medication list for this problem includes:    Flonase 50 Mcg/act Susp (Fluticasone propionate) .Marland Kitchen... 1-2 sprays in each nostril two times a day  Problem # 2:  TOBACCO ABUSE (ICD-305.1) I have spent 15 minutes dedicated to discussion of tobacco symptoms and disease risks as well as available support for cessation efforts.  Medications Added to Medication List This Visit: 1)  Flonase 50 Mcg/act Susp (Fluticasone propionate) .Marland Kitchen.. 1-2 sprays in each nostril two times a day  Other Orders: Consultation Level IV (75643) T-Allergy Profile [place], [place], [place], [place], [place] [phone])  Patient Instructions: 1)  Return as able for allergy skin testing. Stop all antihistamines  3 days before skin testing, including cold and allergy meds, otc sleep and cough meds.  2)  Sample Astepro nasal antihistamine spray: 3)   1-2 puffs each nostril up to twice daily if needed for itching and allergy. Stop this 3 days before skin testing. 4)  Lab 5)  Consider sugarless candies/ throat lozenges to soothe throat.

## 2010-10-26 NOTE — Progress Notes (Signed)
  Phone Note Refill Request Message from:  Fax from Pharmacy on September 03, 2010 9:46 AM  Refills Requested: Medication #1:  VICODIN HP 10-660 MG TABS One by mouth QID as needed for pain   Dosage confirmed as above?Dosage Confirmed   Supply Requested: 1 month   Last Refilled: 03/26/2010   Is this ok to refill  Initial call taken by: Rock Nephew CMA,  September 03, 2010 9:46 AM  Follow-up for Phone Call        ok Follow-up by: Etta Grandchild MD,  September 03, 2010 10:51 AM    Prescriptions: VICODIN HP 10-660 MG TABS (HYDROCODONE-ACETAMINOPHEN) One by mouth QID as needed for pain  #120 x 5   Entered by:   Rock Nephew CMA   Authorized by:   Etta Grandchild MD   Signed by:   Rock Nephew CMA on 09/03/2010   Method used:   Telephoned to ...       [facility] (retail)       [address]       [place], [place]  [address]       Ph: [phone] or [phone]       Fax: [phone]   RxID:   [id number]

## 2010-10-26 NOTE — Progress Notes (Signed)
Summary: pharmacy  Phone Note Call from Patient   Caller: Patient Call For: ramsawmay Summary of Call:  [facility] Initial call taken by: Rickard Patience,  December 14, 2009 3:07 PM  Follow-up for Phone Call        Pt states that his rxs from this am were sent to the wrong pharm.  I  re-[facility] them [facility] per pt request. Follow-up by: Vernie Murders,  December 14, 2009 3:13 PM    Prescriptions: NYSTATIN 100000 UNIT/ML  SUSP (NYSTATIN) Take one teaspoonful by mouth swish & swallow four times a day x 5 days  #110ml x 0   Entered by:   Vernie Murders   Authorized by:   Kalman Shan MD   Signed by:   Vernie Murders on 12/14/2009   Method used:   Electronically to        [facility] (retail)       [address]       [place], [place]  [address]       Ph: [phone] or [phone]       Fax: [phone]   RxID:   [id number] SYMBICORT 160-4.5 MCG/ACT  AERO (BUDESONIDE-FORMOTEROL FUMARATE) Two puffs twice daily  #1 x 6   Entered by:   Vernie Murders   Authorized by:   Kalman Shan MD   Signed by:   Vernie Murders on 12/14/2009   Method used:   Electronically to        [facility] (retail)       [address]       [place], [place]  [address]       Ph: [phone] or [phone]       Fax: [phone]   RxID:   [id number]

## 2010-10-26 NOTE — Progress Notes (Signed)
Summary: prescript for astepro  Phone Note Call from Patient Call back at [phone]   Caller: Patient Call For: young Summary of Call: pt would like prescript for astepro sent to pharmacy he had samples only. [facility] Initial call taken by: Rickard Patience,  May 11, 2010 8:40 AM  Follow-up for Phone Call        rx sent to pharmacy.  pt aware.  Aundra Millet Reynolds LPN  May 11, 2010 9:04 AM     New/Updated Medications: ASTEPRO 0.15 % SOLN (AZELASTINE HCL) 1 to 2 sprays in each nostril up to two times a day as needed for itching and allergy Prescriptions: ASTEPRO 0.15 % SOLN (AZELASTINE HCL) 1 to 2 sprays in each nostril up to two times a day as needed for itching and allergy  #3 x 3   Entered by:   Arman Filter LPN   Authorized by:   Waymon Budge MD   Signed by:   Arman Filter LPN on 62/13/0865   Method used:   Faxed to ...       [facility]       [address]       [place], [place]         Ph: [phone]       Fax: [phone]   RxID:   [id number]

## 2010-10-26 NOTE — Progress Notes (Signed)
Summary: xanax refill  Phone Note Refill Request Message from:  Fax from Pharmacy on July 08, 2010 10:24 AM  Refills Requested: Medication #1:  XANAX 1 MG TABS 1 tab by mouth every 8 hours as needed   Dosage confirmed as above?Dosage Confirmed   Supply Requested: 6 months   Last Refilled: 05/28/2010  Is this ok to fill for pt// did not see where  we gave this in EMR  [facility]  Initial call taken by: Rock Nephew CMA,  July 08, 2010 10:25 AM  Follow-up for Phone Call        yes Follow-up by: Etta Grandchild MD,  July 08, 2010 10:27 AM    Prescriptions: Prudy Feeler 1 MG TABS (ALPRAZOLAM) 1 tab by mouth every 8 hours as needed  #90 x 4   Entered by:   Rock Nephew CMA   Authorized by:   Etta Grandchild MD   Signed by:   Rock Nephew CMA on 07/08/2010   Method used:   Telephoned to ...       [facility] (retail)       [address]       [place], [place]  [id number]       Ph: [phone] or [phone]       Fax: [phone]   RxID:   [id number]

## 2010-10-26 NOTE — Letter (Signed)
Summary: [facility]  [facility]   Imported By: [facility] 10/15/2009 11:14:30  _____________________________________________________________________  External Attachment:    Type:   Image     Comment:   External Document

## 2010-10-26 NOTE — Letter (Signed)
Summary: [facility]  [facility]   Imported By: Sherian Rein 08/27/2010 15:01:45  _____________________________________________________________________  External Attachment:    Type:   Image     Comment:   External Document

## 2010-10-26 NOTE — Progress Notes (Signed)
   Pt signed ROI, faxed over to [facility]. [name]  October 05, 2009 11:57 AM    Appended Document:  Recieved Records form [facility] forwarded to [facility]

## 2010-10-26 NOTE — Progress Notes (Signed)
  Phone Note Call from Patient   Caller: Patient Summary of Call: Patient called requesting refill on his vicoden. He states that due to continued leg pain that he has had to up his dosage to 5xday and would like to know if MD would approve an increase. Please advise Initial call taken by: Rock Nephew CMA,  January 07, 2010 4:28 PM  Follow-up for Phone Call        ok Follow-up by: Etta Grandchild MD,  January 07, 2010 5:13 PM     Appended Document: medication change Medications Added VICODIN 5-500 MG TABS (HYDROCODONE-ACETAMINOPHEN) 1 tab by mouth up to five times daily as needed          Clinical Lists Changes  Medications: Changed medication from VICODIN 5-500 MG TABS (HYDROCODONE-ACETAMINOPHEN) 1 tab by mouth every 4-6 hours as needed to VICODIN 5-500 MG TABS (HYDROCODONE-ACETAMINOPHEN) 1 tab by mouth up to five times daily as needed - Signed Rx of VICODIN 5-500 MG TABS (HYDROCODONE-ACETAMINOPHEN) 1 tab by mouth up to five times daily as needed;  #150 x 3;  Signed;  Entered by: Rock Nephew CMA;  Authorized by: Etta Grandchild MD;  Method used: Telephoned to [facility] [address], [place], [place]  [id number], Ph: [phone] or [phone], Fax: [phone]    Prescriptions: VICODIN 5-500 MG TABS (HYDROCODONE-ACETAMINOPHEN) 1 tab by mouth up to five times daily as needed  #150 x 3   Entered by:   Rock Nephew CMA   Authorized by:   Etta Grandchild MD   Signed by:   Rock Nephew CMA on 01/08/2010   Method used:   Telephoned to ...       [facility] (retail)       [address]       [place], [place]  [id number]       Ph: [phone] or [phone]       Fax: [phone]   RxID:   [id number]  Patient aware/la

## 2010-10-26 NOTE — Progress Notes (Signed)
  Phone Note Call from Patient   Summary of Call: Patient is requesting results of STD & HIV. I see HIV ordered but not resulted?  Initial call taken by: Lamar Sprinkles, CMA,  May 11, 2010 12:23 PM

## 2010-10-26 NOTE — Assessment & Plan Note (Signed)
Summary: skin testing- ok per katie///kp   Vital Signs:  Patient profile:   50 year old male Height:      75 inches O2 Sat:      96 % on Room air Pulse rate:   87 / minute BP sitting:   108 / 68  (left arm) Cuff size:   regular  Vitals Entered By: Reynaldo Minium CMA (June 10, 2010 9:19 AM)  O2 Flow:  Room air CC: Allergy Testing   Copy to:  DR, Yetta Barre Primary Provider/Referring Provider:  Etta Grandchild MD  CC:  Allergy Testing.  History of Present Illness: History of Present Illness: May 04, 2010- 49 yoM smoker seen at kind request of Dr Marchelle Gearing for allergy evaluation.  He has smoked as much as 3 PPD, now down to 1.5 PPD, Starting about a year ago he became bothered by watery rhinorhea, nasal congestion, postnasal drip, itching and tight feeling in throat. He has lived locally all his life with no major recent changes and n o obvious reason why symptoms have started now. Symptoms perennial, but he says worse in mid-summer heat. Snorting is new, reflecting stuffiness lying down. Using Flonase two times a day. Tried zyrtec, allegra, breathe-right strips. None seemed much help. No hx ENT surgery. No unusual reactions to insects, animals, foods or hx of skin rashes. Triggers: heat, wind, grass mowing.  June 10, 2010- COPD/ bronchitis, Rhinitis, tobacco Here for skin testing.  Allergy Profile- Total IgE 7.2, with specific elevations for dust mites.  He denies change in symptoms associated with early Fall season. Astepro works well, used once daily, and he dropped off nasal steroid. Blows nose or stuffy  often. Ears pop. Antihistamine pills don't help. Decongestants do help Skin test- Pos mainly for house dust/ mites  Preventive Screening-Counseling & Management  Alcohol-Tobacco     Alcohol drinks/day: 0     Smoking Status: current     Smoking Cessation Counseling: yes     Smoke Cessation Stage: contemplative     Packs/Day: 1.0 - 1.5     Year Started: 1974     Cans  of tobacco/week: no     Passive Smoke Exposure: no     Tobacco Counseling: to quit use of tobacco products  Current Medications (verified): 1)  Paxil 30 Mg Tabs (Paroxetine Hcl) .... Take 2 Tablet By Mouth Once A Day 2)  Trazodone Hcl 100 Mg Tabs (Trazodone Hcl) .... Take 3 Tab By Mouth At Bedtime 3)  Coreg 12.5 Mg Tabs (Carvedilol) .Marland Kitchen.. 1 Tab Two Times A Day 4)  Xanax 1 Mg Tabs (Alprazolam) .Marland Kitchen.. 1 Tab By Mouth Every 8 Hours As Needed 5)  Albuterol Sulfate (2.5 Mg/84ml) 0.083% Nebu (Albuterol Sulfate) .Marland Kitchen.. 1vial Via Neb Every 4 Hrs As Needed 6)  Vicodin Hp 10-660 Mg Tabs (Hydrocodone-Acetaminophen) .... One By Mouth Qid As Needed For Pain 7)  Spiriva Handihaler 18 Mcg Caps (Tiotropium Bromide Monohydrate) .Marland Kitchen.. 1 Inhalation Once Daily 8)  Crestor 20 Mg Tabs (Rosuvastatin Calcium) .... One By Mouth Once Daily For Cholesterol 9)  Lamisil 250 Mg Tabs (Terbinafine Hcl) .Marland Kitchen.. 1 Tab Two Times A Day 10)  Element Control Normal Liqd (Blood Glucose Calibration) .... Check Meter With Each New Vial of Test Strips or Irregular Readings 11)  Element Test  Strp (Glucose Blood) .... Test Once Daily 12)  Lancets  Misc (Lancets) .... Test Once Daily 13)  Lancing Device  Misc (Lancet Devices) .... Teast Once Daily 14)  Element Auto System .Marland KitchenMarland KitchenMarland Kitchen  Test Once Daily 15)  Nexium 40 Mg Cpdr (Esomeprazole Magnesium) .... One By Mouth Once Daily 16)  Astepro 0.15 % Soln (Azelastine Hcl) .Marland Kitchen.. 1 To 2 Sprays in Each Nostril Up To Two Times A Day As Needed For Itching and Allergy 17)  Viagra 50 Mg Tabs (Sildenafil Citrate) .... As Needed  Allergies (verified): 1)  ! Ace Inhibitors 2)  ! Pcn 3)  ! * Blood Thiners 4)  ! 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 Alcohol use-no Drug use-no Regular exercise-no Illicit Drug Use - no  Risk Factors: Alcohol Use: 0 (06/10/2010) Exercise: yes (06/23/2008)  Risk Factors: Smoking Status: current (06/10/2010) Packs/Day: 1.0 - 1.5 (06/10/2010) Cans of tobacco/wk: no (06/10/2010) Passive Smoke Exposure: no (06/10/2010)  Past Medical History: UNSPECIFIED PERIPHERAL VASCULAR DISEASE (ICD-443.9) SEXUAL ACTIVITY, HIGH RISK (ICD-V69.2) COUGH (ICD-786.2) Rhinitis             -Allergy skin test- 06/10/10- Pos mainly for house dust/ mites COPD (ICD-496) FAMILY HISTORY COLONIC POLYPS (ICD-V18.51) FAMILY HX COLON CANCER (ICD-V16.0) PERSONAL HX COLONIC POLYPS (ICD-V12.72) DYSPHAGIA LIQUID AND SOLID (ICD-787.20) THROMBOCYTOPENIA (ICD-287.5) 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 Allyson Sabal) left foot podiatric surgery 2011  Review of Systems      See HPI  The patient denies anorexia, fever, weight loss, weight gain, vision loss, decreased hearing, hoarseness, chest pain, syncope, dyspnea on exertion, peripheral edema, prolonged cough, headaches, hemoptysis, and abdominal pain.    Physical Exam  Additional Exam:   General: A/Ox3; pleasant and cooperative, NAD, tall  man, intelligent SKIN: no rash, lesions NODES: no lymphadenopathy HEENT: [place]/AT, EOM- WNL, Conjuctivae- clear, PERRLA, TM-cerumen bilaterally, Nose- clear mucus bridging, Throat- tongue coated, Mallampati II. No drainage or erythema. NECK: Supple w/ fair ROM, JVD- none, normal carotid impulses w/o bruits Thyroid- normal to palpation CHEST: Clear to P&A HEART: RRR, no m/g/r heard ABDOMEN: Soft and nl;  WRU:EAVW, nl pulses, no edema  NEURO: Grossly intact to observation      Impression & Recommendations:  Problem # 1:  ALLERGIC RHINITIS CAUSE UNSPECIFIED (ICD-477.9)  Sensitivity to house dust/ mites. I  suggested environmental control measures including encasings and HEPA filter especially for bedroom. He can use a decongestant as needed. Tobacco smoke irritation is also likely important. His updated medication list for this problem includes:    Astepro 0.15 % Soln (Azelastine hcl) .Marland Kitchen... 1 to 2 sprays in each nostril up to two times a day as needed for itching and allergy  Problem # 2:  TOBACCO ABUSE (ICD-305.1)  Continued encouragement to stop. He has reduced smoking from 2.5 to 1 PPD.  Medications Added to Medication List This Visit: 1)  Viagra 50 Mg Tabs (Sildenafil citrate) .... As needed  Other Orders: Est. Patient Level II ([address]) Allergy Puncture Test (60454) Allergy I.D Test (09811)  Patient Instructions: 1)  Follow with Dr Marchelle Gearing as planned. i can see you again for allergy issues as needed 2)  cc Dr Marchelle Gearing 3)  Please try very hard to stop smoking- that will help your nose and chest more than anything. 4)  Consider the dust control information I gave you- especially about encasings and air cleaners. 5)  Decongestants like Sudafed or Phenylephrine can help with stuffines as needed.

## 2010-10-26 NOTE — Progress Notes (Signed)
Summary: prescription   [id number]  Phone Note Call from Patient Call back at Home Phone [phone]   Caller: Patient Call For: parrett Summary of Call: Pt is very upset, he says Almyra Free didn't send his orders to [facility] and also his prescriptiom wasn't at his pharmacy, would like some answers. Initial call taken by: Darletta Moll,  November 03, 2009 1:19 PM  Follow-up for Phone Call        [facility].  Aundra Millet Reynolds LPN  November 03, 2009 2:07 PM   Additional Follow-up for Phone Call Additional follow up Details #1::        Almyra Free, this pt states that he spoke with [facility] this am and was told that they never recieved orders for neb meds and ONO to be done- but looks like you already took care of this one.   Pt upset.  Please advise thanks! Additional Follow-up by: Vernie Murders,  November 03, 2009 2:14 PM    Additional Follow-up for Phone Call Additional follow up Details #2::    spoke to pt he has now heard from [facility] that they did get his neb med orders and ono  Follow-up by: Oneita Jolly,  November 03, 2009 3:29 PM

## 2010-10-26 NOTE — Progress Notes (Signed)
Summary: pt needs stronger pain meds   Phone Note Call from Patient Call back at Home Phone [phone]   Reason for Call: Talk to Nurse, Talk to Doctor Summary of Call: per pt call his legs are hurting worse so can he have something other than vicodin and if it is called in please call to [facility]. Initial call taken by: Omer Jack,  November 23, 2009 8:35 AM  Follow-up for Phone Call        Spoke with pt.  Instructed pt he would need to call primary MD for pain med as this was who initially prescribed it.  Pt agreeable with this plan.  ABI's normal on 10/30/09. Per Dr. Clifton James pt does not need repeat ABI's at this time. Follow-up by: Dossie Arbour, RN, BSN,  November 23, 2009 10:24 AM
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
## 2010-10-26 NOTE — Letter (Signed)
Summary: [facility]  [facility]   Imported By: Lester Lakeview 10/20/2009 16:38:18  _____________________________________________________________________  External Attachment:    Type:   Image     Comment:   External Document

## 2010-10-26 NOTE — Letter (Signed)
Summary: [facility] Office Note  [facility] Office Note   Imported By: Roderic Ovens 11/06/2009 14:00:34  _____________________________________________________________________  External Attachment:    Type:   Image     Comment:   External Document

## 2010-10-26 NOTE — Assessment & Plan Note (Signed)
Summary: freq cough/sob/offered sooner appt/cd   Vital Signs:  Patient profile:   50 year old male Height:      72 inches Weight:      213 pounds BMI:     28.99 O2 Sat:      96 % on Room air Temp:     98.2 degrees F oral Pulse rate:   75 / minute Pulse rhythm:   regular Resp:     16 per minute BP sitting:   102 / 62  (right arm) Cuff size:   large  Vitals Entered By: Rock Nephew CMA (October 21, 2009 10:19 AM)  Nutrition Counseling: Patient's BMI is greater than 25 and therefore counseled on weight management options.  O2 Flow:  Room air  Primary Care Provider:  Etta Grandchild MD   History of Present Illness: He returns c/o persistent NP cough.  Preventive Screening-Counseling & Management  Alcohol-Tobacco     Alcohol drinks/day: 0     Smoking Status: current     Smoking Cessation Counseling: yes     Smoke Cessation Stage: precontemplative     Packs/Day: 1.0     Cans of tobacco/week: no     Passive Smoke Exposure: no  Hep-HIV-STD-Contraception     Hepatitis Risk: risk noted     Hepatitis Risk Counseling: to avoid increased hepatitis risk     HIV Risk: risk noted     HIV Risk Counseling: to avoid increased HIV risk     STD Risk: risk noted     STD Risk Counseling: to avoid increased STD risk      Sexual History:  multiple partners in the past.        Drug Use:  former.        Blood Transfusions:  no.    Medications Prior to Update: 1)  Coreg 12.5 Mg Tabs (Carvedilol) .... Take 1 Tablet By Mouth Two Times A Day 2)  Vicodin 5-500 Mg Tabs (Hydrocodone-Acetaminophen) .... Four Times A Day As Needed 3)  Ventolin Hfa 108 (90 Base) Mcg/act Aers (Albuterol Sulfate) .... 2 Puff Up To Qid 4)  Clozapine 100 Mg Tabs (Clozapine) .... 1/2 Am and 100mg  At Bedtime 5)  Xanax 1 Mg Tabs (Alprazolam) .... Take 1 Tablet By Mouth Three Times A Day 6)  Paxil 30 Mg Tabs (Paroxetine Hcl) .... Two Tablets By Mouth Once Daily 7)  Onetouch Ultra Test  Strp (Glucose Blood) ....  Qid, and Lancets 250.01.  Variable Glucoses 8)  Vytorin 10-40 Mg Tabs (Ezetimibe-Simvastatin) .... Once Daily 9)  Protonix 40 Mg Tbec (Pantoprazole Sodium) .... Take 1 Tablet By Mouth Twice A Day ( Before Breakfast and Supper) 10)  Spiriva Handihaler 18 Mcg Caps (Tiotropium Bromide Monohydrate) .... One Puff Once Daily 11)  Trazodone Hcl 100 Mg Tabs (Trazodone Hcl) .... One By Mouth Qhs 12)  Aspirin 81 Mg Tbec (Aspirin) .... Take One Tablet By Mouth Daily  Current Medications (verified): 1)  Coreg 12.5 Mg Tabs (Carvedilol) .... Take 1 Tablet By Mouth Two Times A Day 2)  Vicodin 5-500 Mg Tabs (Hydrocodone-Acetaminophen) .... Four Times A Day As Needed 3)  Ventolin Hfa 108 (90 Base) Mcg/act Aers (Albuterol Sulfate) .... 2 Puff Up To Qid 4)  Xanax 1 Mg Tabs (Alprazolam) .... Take 1 Tablet By Mouth Three Times A Day 5)  Paxil 30 Mg Tabs (Paroxetine Hcl) .... Two Tablets By Mouth Once Daily 6)  Onetouch Ultra Test  Strp (Glucose Blood) .... Qid, and Lancets 250.01.  Variable Glucoses 7)  Vytorin 10-40 Mg Tabs (Ezetimibe-Simvastatin) .... Once Daily 8)  Protonix 40 Mg Tbec (Pantoprazole Sodium) .... Take 1 Tablet By Mouth Twice A Day ( Before Breakfast and Supper) 9)  Spiriva Handihaler 18 Mcg Caps (Tiotropium Bromide Monohydrate) .... One Puff Once Daily 10)  Trazodone Hcl 100 Mg Tabs (Trazodone Hcl) .... One By Mouth Qhs 11)  Aspirin 81 Mg Tbec (Aspirin) .... Take One Tablet By Mouth Daily 12)  Chantix Starting Month Pak 0.5 Mg X 11 & 1 Mg X 42 Tabs (Varenicline Tartrate) .... Take As Directed  Allergies (verified): 1)  ! Ace Inhibitors  Past History:  Past Medical History: Reviewed history from 10/02/2009 and no changes required. 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 Allyson Sabal)  Family History: Reviewed history from 10/05/2009 and no changes required. Family History of Alcoholism/Addiction Family History of Arthritis Family History Diabetes 3/6 sibs Family History High cholesterol Family History Hypertension Family History of Cardiovascular disorder Family History of Colon Cancer:Mother Family History of Colon Polyps:sisters  Mother alive at 63 with colon cancer,CVA Father deceased MI at age 68  Social History: Reviewed history from 10/05/2009 and no changes required. Disabled due to paranoid/schizophrenia Single Current Smoker 1ppd Alcohol use-no Drug use-no Regular exercise-no Illicit Drug Use - no  Review of Systems       The patient complains of weight gain and prolonged cough.  The patient denies anorexia, fever, weight loss, chest pain, syncope, dyspnea on exertion, peripheral edema, headaches, hemoptysis, abdominal pain, suspicious skin lesions, difficulty walking, depression, abnormal bleeding, enlarged lymph nodes, and angioedema.   Resp:  Complains of cough, excessive snoring, and shortness of breath; denies chest discomfort, chest pain with inspiration, coughing up blood, pleuritic, sputum productive, and wheezing.  Physical Exam  General:  alert, well-developed, well-nourished, and  well-hydrated.   Head:  normocephalic, atraumatic, no abnormalities observed, and no abnormalities palpated.   Ears:  R ear normal and L ear normal.   Nose:  no nasal discharge, no mucosal pallor, no mucosal edema, no airflow obstruction, no intranasal foreign body, no nasal polyps, no nasal mucosal lesions, no mucosal friability, no active bleeding or clots, and no sinus percussion tenderness.   Mouth:  Oral mucosa and oropharynx without lesions or exudates.  Teeth in good repair. Neck:  supple, full ROM, no masses, no thyromegaly, no thyroid nodules or tenderness, no JVD, no carotid bruits, no cervical lymphadenopathy, and no neck tenderness.   Lungs:  normal respiratory effort, no intercostal retractions, no accessory muscle use, and normal breath sounds.   Heart:  Normal rate and regular rhythm. S1 and S2 normal without gallop, murmur, click, rub or other extra sounds. Abdomen:  soft, non-tender, normal bowel sounds, no distention, no hepatomegaly, and no splenomegaly.   Msk:  normal ROM, no joint tenderness, no joint swelling, no joint warmth, and no redness over joints.  Pulses:  R femoral decreased, R popliteal decreased, R posterior tibial decreased, R dorsalis pedis decreased, L femoral decreased, L popliteal decreased, L posterior tibial decreased, and L dorsalis pedis decreased.   Extremities:  No clubbing, cyanosis, edema, or deformity noted with normal full range of motion of all joints.   Neurologic:  No cranial nerve deficits noted. Station and gait are normal. Plantar reflexes are down-going bilaterally. DTRs are symmetrical throughout. Sensory, motor and coordinative functions appear intact. Skin:  turgor normal, color normal, no rashes, no suspicious lesions, no ecchymoses, no petechiae, and no purpura.   Cervical Nodes:  no anterior cervical adenopathy and no posterior cervical adenopathy.   Psych:  Cognition and judgment appear intact. Alert and cooperative with normal attention  span and concentration. No apparent delusions, illusions, hallucinations  Diabetes Management Exam:    Foot Exam (with socks and/or shoes not present):       Sensory-Pinprick/Light touch:          Left medial foot (L-4): normal          Left dorsal foot (L-5): normal          Left lateral foot (S-1): normal          Right medial foot (L-4): normal          Right dorsal foot (L-5): normal          Right lateral foot (S-1): normal       Sensory-Monofilament:          Left foot: normal          Right foot: normal       Inspection:          Left foot: normal          Right foot: normal       Nails:          Left foot: normal          Right foot: normal   Impression & Recommendations:  Problem # 1:  TOBACCO USE (ICD-305.1) Assessment Unchanged  His updated medication list for this problem includes:    Chantix Starting Month Pak 0.5 Mg X 11 & 1 Mg X 42 Tabs (Varenicline tartrate) .Marland Kitchen... Take as directed  Encouraged smoking cessation and discussed different methods for smoking cessation.   Orders: Prescription Created Electronically [phone]) Tobacco use cessation intermediate 3-10 minutes (57846)  Problem # 2:  HYPERTENSION, BENIGN (ICD-401.1) Assessment: Improved  His updated medication list for this problem includes:    Coreg 12.5 Mg Tabs (Carvedilol) .Marland Kitchen... Take 1 tablet by mouth two times a day  BP today: 102/62 Prior BP: 136/70 (10/14/2009)  Prior 10 Yr Risk Heart Disease: Not enough information (04/15/2009)  Labs Reviewed: K+: 4.1 (08/14/2009) Creat: : 0.9 (08/14/2009)   Chol: 236 (06/23/2008)   HDL: 30.9 (06/23/2008)   LDL: DEL (06/23/2008)   TG: 135 (06/23/2008)  Problem # 3:  COPD (ICD-496) Assessment: Deteriorated  His updated medication list for this problem includes:    Ventolin Hfa 108 (90 Base) Mcg/act Aers (Albuterol sulfate) .Marland Kitchen... 2 puff up to qid    Spiriva Handihaler 18 Mcg Caps (Tiotropium bromide monohydrate) ..... One puff once  daily  Orders: Pulmonary Referral (Pulmonary) Tobacco use cessation intermediate 3-10 minutes (96295)  Pulmonary Functions Reviewed: O2 sat: 96 (10/21/2009)     Vaccines Reviewed: Pneumovax: Pneumovax (12/06/2007)   Flu Vax: Fluvax 3+ (06/23/2009)  Problem # 4:  DIAB W/UNS COMP TYPE II/UNS NOT STATED UNCNTRL (ICD-250.90) Assessment: Improved  His updated medication list for this problem includes:    Aspirin 81 Mg Tbec (Aspirin) .Marland Kitchen... Take one tablet by mouth daily  Labs Reviewed: Creat: 0.9 (08/14/2009)    Reviewed HgBA1c results: 6.2 (08/14/2009)  6.6 (05/08/2009)  Complete Medication List: 1)  Coreg 12.5 Mg Tabs (Carvedilol) .... Take 1 tablet by mouth two times a day 2)  Vicodin 5-500 Mg Tabs (Hydrocodone-acetaminophen) .... Four times a day as needed 3)  Ventolin Hfa 108 (90 Base) Mcg/act Aers (Albuterol sulfate) .... 2 puff up to qid 4)  Xanax 1 Mg Tabs (Alprazolam) .... Take 1 tablet by mouth three times a day 5)  Paxil 30 Mg Tabs (Paroxetine hcl) .... Two tablets by mouth once daily 6)  Onetouch Ultra Test Strp (Glucose blood) .... Qid, and lancets 250.01.  variable glucoses 7)  Vytorin 10-40 Mg Tabs (Ezetimibe-simvastatin) .... Once daily 8)  Protonix 40 Mg Tbec (Pantoprazole sodium) .... Take 1 tablet by mouth twice a day ( before breakfast and supper) 9)  Spiriva Handihaler 18 Mcg Caps (Tiotropium bromide monohydrate) .... One puff once daily 10)  Trazodone Hcl 100 Mg Tabs (Trazodone hcl) .... One by mouth qhs 11)  Aspirin 81 Mg Tbec (Aspirin) .... Take one tablet by mouth daily 12)  Chantix Starting Month Pak 0.5 Mg X 11 & 1 Mg X 42 Tabs (Varenicline tartrate) .... Take as directed  Patient Instructions: 1)  Please schedule a follow-up appointment in 1 month. 2)  Tobacco is very bad for your health and your loved ones! You Should stop smoking!. 3)  Stop Smoking Tips: Choose a Quit date. Cut down before the Quit date. decide what you will do as a substitute when  you feel the urge to smoke(gum,toothpick,exercise). 4)  It is important that you exercise regularly at least 20 minutes 5 times a week. If you develop chest pain, have severe difficulty breathing, or feel very tired , stop exercising immediately and seek medical attention. 5)  You need to lose weight. Consider a lower calorie diet and regular exercise.  Prescriptions: CHANTIX STARTING MONTH PAK 0.5 MG X 11 & 1 MG X 42 TABS (VARENICLINE TARTRATE) take as directed  #1 x 0   Entered and Authorized by:   Etta Grandchild MD   Signed by:   Etta Grandchild MD on 10/21/2009   Method used:   Electronically to        [facility] (retail)       [address]       [place], [place]  [id number]       Ph: [phone]       Fax: [phone]   RxID:   [id number]

## 2010-10-26 NOTE — Assessment & Plan Note (Signed)
Summary: FEELS LIKE HE CANNOT EMPTY HIS BLADDER COMPLETELY.CD   Vital Signs:  Patient profile:   50 year old male Height:      73 inches (185.42 cm) Weight:      217.75 pounds (98.98 kg) O2 Sat:      97 % on Room air Temp:     98.1 degrees F (36.72 degrees C) oral Pulse rate:   81 / minute Pulse rhythm:   regular Resp:     16 per minute BP sitting:   110 / 60  (left arm) Cuff size:   regular  Vitals Entered By: Rock Nephew CMA (December 21, 2009 9:36 AM) Sydell Axon, SMA  O2 Flow:  Room air CC: Pt c/o trouble urinating, coating on tongue//Anna   Primary Care Provider:  Etta Grandchild MD  CC:  Pt c/o trouble urinating and coating on tongue//[place].  History of Present Illness: He returns with complaints. 1. He has a dry mouth and thrush that won't go away despite Nystatin s/s so he wants to stop Symbicort and go back to Spiriva.  2. He has had trouble urinating for  1-2 months with hesitancy and dribbling.  3. He wants to be screened for STD's  Preventive Screening-Counseling & Management  Alcohol-Tobacco     Alcohol drinks/day: 0     Smoking Status: current     Smoking Cessation Counseling: yes     Smoke Cessation Stage: ready     Packs/Day: 1.0     Cans of tobacco/week: no     Passive Smoke Exposure: no     Tobacco Counseling: to quit use of tobacco products  Hep-HIV-STD-Contraception     Hepatitis Risk: risk noted     Hepatitis Risk Counseling: to avoid increased hepatitis risk     HIV Risk: risk noted     HIV Risk Counseling: to avoid increased HIV risk     STD Risk: risk noted     STD Risk Counseling: to avoid increased STD risk      Sexual History:  multiple partners currently.        Drug Use:  never.        Blood Transfusions:  no.    Medications Prior to Update: 1)  Abilify 10 Mg Tabs (Aripiprazole) .... Take 1 Tab By Mouth At Bedtime 2)  Paxil 30 Mg Tabs (Paroxetine Hcl) .... Take 2 Tablet By Mouth Once A Day 3)  Trazodone Hcl 100 Mg Tabs  (Trazodone Hcl) .... Take 3 Tab By Mouth At Bedtime 4)  Coreg 12.5 Mg Tabs (Carvedilol) .... Take 1 Tablet By Mouth Two Times A Day 5)  Vytorin 10-40 Mg Tabs (Ezetimibe-Simvastatin) .... Take 1 Tab By Mouth At Bedtime 6)  Protonix 40 Mg Tbec (Pantoprazole Sodium) .... Take 1 Tablet By Mouth Twice A Day ( Before Breakfast and Supper) 7)  Pepcid Ac 10 Mg Tabs (Famotidine) .... Take 1 Tab By Mouth At Bedtime 8)  Xanax 1 Mg Tabs (Alprazolam) .Marland Kitchen.. 1 Tab By Mouth Every 8 Hours As Needed 9)  Ventolin Hfa 108 (90 Base) Mcg/act Aers (Albuterol Sulfate) .... Inhale 2 Puffs Every Four Hours As Needed 10)  Albuterol Sulfate (2.5 Mg/62ml) 0.083% Nebu (Albuterol Sulfate) .Marland Kitchen.. 1vial Via Neb Every 4 Hrs As Needed 11)  Vicodin 5-500 Mg Tabs (Hydrocodone-Acetaminophen) .Marland Kitchen.. 1 Tab By Mouth Every 4-6 Hours As Needed 12)  Symbicort 160-4.5 Mcg/act  Aero (Budesonide-Formoterol Fumarate) .... Two Puffs Twice Daily  Current Medications (verified): 1)  Abilify 10 Mg Tabs (  Aripiprazole) .... Take 1 Tab By Mouth At Bedtime 2)  Paxil 30 Mg Tabs (Paroxetine Hcl) .... Take 2 Tablet By Mouth Once A Day 3)  Trazodone Hcl 100 Mg Tabs (Trazodone Hcl) .... Take 3 Tab By Mouth At Bedtime 4)  Coreg 12.5 Mg Tabs (Carvedilol) .... Take 1 Tablet By Mouth Two Times A Day 5)  Vytorin 10-40 Mg Tabs (Ezetimibe-Simvastatin) .... Take 1 Tab By Mouth At Bedtime 6)  Protonix 40 Mg Tbec (Pantoprazole Sodium) .... Take 1 Tablet By Mouth Twice A Day ( Before Breakfast and Supper) 7)  Pepcid Ac 10 Mg Tabs (Famotidine) .... Take 1 Tab By Mouth At Bedtime 8)  Xanax 1 Mg Tabs (Alprazolam) .Marland Kitchen.. 1 Tab By Mouth Every 8 Hours As Needed 9)  Ventolin Hfa 108 (90 Base) Mcg/act Aers (Albuterol Sulfate) .... Inhale 2 Puffs Every Four Hours As Needed 10)  Albuterol Sulfate (2.5 Mg/24ml) 0.083% Nebu (Albuterol Sulfate) .Marland Kitchen.. 1vial Via Neb Every 4 Hrs As Needed 11)  Vicodin 5-500 Mg Tabs (Hydrocodone-Acetaminophen) .Marland Kitchen.. 1 Tab By Mouth Every 4-6 Hours As  Needed 12)  Fluconazole 200 Mg Tabs (Fluconazole) .... One By Mouth Once Daily For 30 Days 13)  Spiriva Handihaler 18 Mcg Caps (Tiotropium Bromide Monohydrate) .... One Inhalation Once Daily 14)  Doxycycline Hyclate 100 Mg Tabs (Doxycycline Hyclate) .... One By Mouth Two Times A Day For 30 Days  Allergies (verified): 1)  ! Ace Inhibitors 2)  ! Pcn 3)  ! * Blood Thiners 4)  ! Plavix (Clopidogrel Bisulfate)  Past History:  Past Medical History: Reviewed history from 10/02/2009 and no changes required. 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 Family History of Alcoholism/Addiction Family History of Arthritis Family History Diabetes 3/6 sibs Family History High cholesterol Family History Hypertension Family History of Cardiovascular disorder Family History of Colon Cancer:Mother Family History of Colon Polyps:sisters  Mother alive at 26 with colon cancer,CVA Father deceased MI at age 63  Social History: Reviewed history from 10/23/2009 and no changes required. 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 Alcohol use-no Drug use-no Regular exercise-no Illicit Drug Use - no Sexual History:  multiple partners currently Drug  Use:  never  Review of Systems       The patient complains of weight gain.  The patient denies anorexia, fever, weight loss, chest pain, syncope, peripheral edema, prolonged cough, headaches, hemoptysis, abdominal pain, melena, hematochezia, severe indigestion/heartburn, hematuria, suspicious skin lesions, difficulty walking, depression, enlarged lymph nodes, angioedema, and testicular masses.   General:  Denies chills, fatigue, fever, loss of appetite, malaise, sleep disorder, sweats, weakness, and weight loss. Resp:  Complains of shortness of breath; denies chest pain with inspiration, cough, coughing up blood, pleuritic, sputum productive, and wheezing. GI:  Denies abdominal pain, bloody stools, change in bowel habits, diarrhea, indigestion, loss of appetite, nausea, vomiting, and yellowish skin color. GU:  Complains of nocturia and urinary hesitancy; denies decreased libido, discharge, dysuria, erectile dysfunction, genital sores, hematuria, incontinence, and  urinary frequency. Endo:  Complains of weight change; denies cold intolerance, excessive hunger, excessive thirst, excessive urination, heat intolerance, and polyuria.  Physical Exam  General:  alert, well-developed, well-nourished, and well-hydrated.   Head:  normocephalic, atraumatic, no abnormalities observed, and no abnormalities palpated.   Eyes:  pink moist mm., no icterus Ears:  R ear normal and L ear normal.   Nose:  no nasal discharge, no mucosal pallor, no mucosal edema, no airflow obstruction, no intranasal foreign body, no nasal polyps, no nasal mucosal lesions, no mucosal friability, no active bleeding or clots, and no sinus percussion tenderness.   Mouth:  white plaque/exudate on tongue with mild erythema. Neck:  supple, full ROM, no masses, no thyromegaly, no thyroid nodules or tenderness, no JVD, no carotid bruits, no cervical lymphadenopathy, and no neck tenderness.   Lungs:  normal respiratory effort, no intercostal  retractions, no accessory muscle use, and normal breath sounds.   Heart:  Normal rate and regular rhythm. S1 and S2 normal without gallop, murmur, click, rub or other extra sounds. Abdomen:  soft, non-tender, normal bowel sounds, no distention, no hepatomegaly, and no splenomegaly.   Rectal:  no external abnormalities, no hemorrhoids, no masses, no tenderness, no fissures, no fistulae, no perianal rash, and poor sphincter tone.  heme neg stool. Genitalia:  circumcised, no hydrocele, no varicocele, no scrotal masses, no testicular masses or atrophy, no cutaneous lesions, and no urethral discharge.   Prostate:  no nodules, no asymmetry, no induration, boggy, and 1+ enlarged.   Msk:  normal ROM, no joint tenderness, no joint swelling, no joint warmth, and no redness over joints.   Pulses:  R femoral decreased, R popliteal decreased, R posterior tibial decreased, R dorsalis pedis decreased, L femoral decreased, L popliteal decreased, L posterior tibial decreased, and L dorsalis pedis decreased.   Extremities:  No clubbing, cyanosis, edema, or deformity noted with normal full range of motion of all joints.   Neurologic:  No cranial nerve deficits noted. Station and gait are normal. Plantar reflexes are down-going bilaterally. DTRs are symmetrical throughout. Sensory, motor and coordinative functions appear intact. Skin:  turgor normal, color normal, no rashes, no suspicious lesions, no ecchymoses, no petechiae, and no purpura.   Cervical Nodes:  no anterior cervical adenopathy and no posterior cervical adenopathy.   Axillary Nodes:  no R axillary adenopathy and no L axillary adenopathy.   Inguinal Nodes:  no R inguinal adenopathy and no L inguinal adenopathy.   Psych:  Cognition and judgment appear intact. Alert and cooperative with normal attention span and concentration. No apparent delusions, illusions, hallucinations  Diabetes Management Exam:    Foot Exam (with socks and/or shoes not present):        Sensory-Pinprick/Light touch:          Left medial foot (L-4): normal          Left dorsal foot (L-5): normal          Left lateral foot (S-1): normal          Right medial foot (L-4): normal          Right dorsal foot (L-5): normal          Right lateral foot (S-1): normal       Sensory-Monofilament:          Left foot: normal          Right foot: normal       Inspection:  Left foot: normal          Right foot: normal       Nails:          Left foot: normal          Right foot: normal   Impression & Recommendations:  Problem # 1:  ACUTE PROSTATITIS (ICD-601.0) Assessment New start doxycycline Orders: Venipuncture (14782) TLB-BMP (Basic Metabolic Panel-BMET) (80048-METABOL) TLB-CBC Platelet - w/Differential (85025-CBCD) TLB-Hepatic/Liver Function Pnl (80076-HEPATIC) TLB-TSH (Thyroid Stimulating Hormone) (84443-TSH) TLB-A1C / Hgb A1C (Glycohemoglobin) (83036-A1C) TLB-Udip w/ Micro (81001-URINE) T-HIV Antibody  (Reflex) [id number]) T-RPR (Syphilis) [phone]) T-Chlamydia  Probe, urine (84132-44010) T-GC Probe, urine (27253-66440) TLB-PSA (Prostate Specific Antigen) (84153-PSA) Prescription Created Electronically [phone])  Problem # 2:  THRUSH (ICD-112.0) Assessment: Deteriorated start oral diflucan and stop symbicort Orders: Venipuncture (59563) TLB-BMP (Basic Metabolic Panel-BMET) (80048-METABOL) TLB-CBC Platelet - w/Differential (85025-CBCD) TLB-Hepatic/Liver Function Pnl (80076-HEPATIC) TLB-TSH (Thyroid Stimulating Hormone) (84443-TSH) TLB-A1C / Hgb A1C (Glycohemoglobin) (83036-A1C) TLB-Udip w/ Micro (81001-URINE) T-HIV Antibody  (Reflex) [id number]) T-RPR (Syphilis) (18841-66063) T-Chlamydia  Probe, urine (01601-09323) T-GC Probe, urine (55732-20254) TLB-PSA (Prostate Specific Antigen) (84153-PSA) Prescription Created Electronically [phone])  Problem # 3:  HYPERTENSION, BENIGN (ICD-401.1) Assessment: Improved  His updated medication list for  this problem includes:    Coreg 12.5 Mg Tabs (Carvedilol) .Marland Kitchen... Take 1 tablet by mouth two times a day  Orders: Venipuncture (37628) TLB-BMP (Basic Metabolic Panel-BMET) (80048-METABOL) TLB-CBC Platelet - w/Differential (85025-CBCD) TLB-Hepatic/Liver Function Pnl (80076-HEPATIC) TLB-TSH (Thyroid Stimulating Hormone) (84443-TSH) TLB-A1C / Hgb A1C (Glycohemoglobin) (83036-A1C) TLB-Udip w/ Micro (81001-URINE) T-HIV Antibody  (Reflex) [id number]) T-RPR (Syphilis) [phone]) T-Chlamydia  Probe, urine (54627-03500) T-GC Probe, urine (93818-29937) TLB-PSA (Prostate Specific Antigen) (84153-PSA)  BP today: 110/60 Prior BP: 128/68 (12/14/2009)  Prior 10 Yr Risk Heart Disease: Not enough information (04/15/2009)  Labs Reviewed: K+: 4.1 (08/14/2009) Creat: : 0.9 (08/14/2009)   Chol: 236 (06/23/2008)   HDL: 30.9 (06/23/2008)   LDL: DEL (06/23/2008)   TG: 135 (06/23/2008)  Problem # 4:  SEXUAL ACTIVITY, HIGH RISK (ICD-V69.2) Assessment: Unchanged  Orders: Venipuncture (16967) TLB-BMP (Basic Metabolic Panel-BMET) (80048-METABOL) TLB-CBC Platelet - w/Differential (85025-CBCD) TLB-Hepatic/Liver Function Pnl (80076-HEPATIC) TLB-TSH (Thyroid Stimulating Hormone) (84443-TSH) TLB-A1C / Hgb A1C (Glycohemoglobin) (83036-A1C) TLB-Udip w/ Micro (81001-URINE) T-HIV Antibody  (Reflex) [id number]) T-RPR (Syphilis) (02585-27782) T-Chlamydia  Probe, urine (42353-61443) T-GC Probe, urine (15400-86761) TLB-PSA (Prostate Specific Antigen) (84153-PSA)  Problem # 5:  COPD (ICD-496) Assessment: Unchanged  The following medications were removed from the medication list:    Symbicort 160-4.5 Mcg/act Aero (Budesonide-formoterol fumarate) .Marland Kitchen..Marland Kitchen Two puffs twice daily His updated medication list for this problem includes:    Ventolin Hfa 108 (90 Base) Mcg/act Aers (Albuterol sulfate) ..... Inhale 2 puffs every four hours as needed    Albuterol Sulfate (2.5 Mg/59ml) 0.083% Nebu (Albuterol sulfate)  .Marland Kitchen... 1vial via neb every 4 hrs as needed    Spiriva Handihaler 18 Mcg Caps (Tiotropium bromide monohydrate) ..... One inhalation once daily  Orders: Prescription Created Electronically ([phone])  Pulmonary Functions Reviewed: O2 sat: 97 (12/21/2009)     Vaccines Reviewed: Pneumovax: Pneumovax (12/06/2007)   Flu Vax: Fluvax 3+ (06/23/2009)  Problem # 6:  DIAB W/UNS COMP TYPE II/UNS NOT STATED UNCNTRL (ICD-250.90) Assessment: Improved  Orders: Venipuncture (26712) TLB-BMP (Basic Metabolic Panel-BMET) (80048-METABOL) TLB-CBC Platelet - w/Differential (85025-CBCD) TLB-Hepatic/Liver Function Pnl (80076-HEPATIC) TLB-TSH (Thyroid Stimulating Hormone) (84443-TSH) TLB-A1C / Hgb A1C (Glycohemoglobin) (83036-A1C) TLB-Udip w/ Micro (81001-URINE) T-HIV Antibody  (Reflex) [id number]) T-RPR (Syphilis) (25053-97673) T-Chlamydia  Probe, urine (41937-90240) T-GC Probe, urine (97353-29924) TLB-PSA (Prostate  Specific Antigen) (84153-PSA)  Labs Reviewed: Creat: 0.9 (08/14/2009)    Reviewed HgBA1c results: 6.2 (08/14/2009)  6.6 (05/08/2009)  Complete Medication List: 1)  Abilify 10 Mg Tabs (Aripiprazole) .... Take 1 tab by mouth at bedtime 2)  Paxil 30 Mg Tabs (Paroxetine hcl) .... Take 2 tablet by mouth once a day 3)  Trazodone Hcl 100 Mg Tabs (Trazodone hcl) .... Take 3 tab by mouth at bedtime 4)  Coreg 12.5 Mg Tabs (Carvedilol) .... Take 1 tablet by mouth two times a day 5)  Vytorin 10-40 Mg Tabs (Ezetimibe-simvastatin) .... Take 1 tab by mouth at bedtime 6)  Protonix 40 Mg Tbec (Pantoprazole sodium) .... Take 1 tablet by mouth twice a day ( before breakfast and supper) 7)  Pepcid Ac 10 Mg Tabs (Famotidine) .... Take 1 tab by mouth at bedtime 8)  Xanax 1 Mg Tabs (Alprazolam) .Marland Kitchen.. 1 tab by mouth every 8 hours as needed 9)  Ventolin Hfa 108 (90 Base) Mcg/act Aers (Albuterol sulfate) .... Inhale 2 puffs every four hours as needed 10)  Albuterol Sulfate (2.5 Mg/70ml) 0.083% Nebu (Albuterol  sulfate) .Marland Kitchen.. 1vial via neb every 4 hrs as needed 11)  Vicodin 5-500 Mg Tabs (Hydrocodone-acetaminophen) .Marland Kitchen.. 1 tab by mouth every 4-6 hours as needed 12)  Fluconazole 200 Mg Tabs (Fluconazole) .... One by mouth once daily for 30 days 13)  Spiriva Handihaler 18 Mcg Caps (Tiotropium bromide monohydrate) .... One inhalation once daily 14)  Doxycycline Hyclate 100 Mg Tabs (Doxycycline hyclate) .... One by mouth two times a day for 30 days  Patient Instructions: 1)  Please schedule a follow-up appointment in 2 months. 2)  Tobacco is very bad for your health and your loved ones! You Should stop smoking!. 3)  Stop Smoking Tips: Choose a Quit date. Cut down before the Quit date. decide what you will do as a substitute when you feel the urge to smoke(gum,toothpick,exercise). 4)  It is important that you exercise regularly at least 20 minutes 5 times a week. If you develop chest pain, have severe difficulty breathing, or feel very tired , stop exercising immediately and seek medical attention. 5)  You need to lose weight. Consider a lower calorie diet and regular exercise.  6)  If you could be exposed to sexually transmitted diseases, you should use a condom. 7)  Check your blood sugars regularly. If your readings are usually above 200  or below 70 you should contact our office. 8)  It is important that your Diabetic A1c level is checked every 3 months. 9)  See your eye doctor yearly to check for diabetic eye damage. 10)  Check your feet each night for sore areas, calluses or signs of infection. 11)  Check your Blood Pressure regularly. If it is above 130/80: you should make an appointment. 12)  Take your antibiotic as prescribed until ALL of it is gone, but stop if you develop a rash or swelling and contact our office as soon as possible. Prescriptions: DOXYCYCLINE HYCLATE 100 MG TABS (DOXYCYCLINE HYCLATE) One by mouth two times a day for 30 days  #60 x 1   Entered and Authorized by:   Etta Grandchild  MD   Signed by:   Etta Grandchild MD on 12/21/2009   Method used:   Electronically to        [facility] (retail)       [address]       [place], [place]  [address]  Ph: [phone] or [phone]       Fax: [phone]   RxID:   [id number] SPIRIVA HANDIHALER 18 MCG CAPS (TIOTROPIUM BROMIDE MONOHYDRATE) One inhalation once daily  #30 x 11   Entered and Authorized by:   Etta Grandchild MD   Signed by:   Etta Grandchild MD on 12/21/2009   Method used:   Electronically to        [facility] (retail)       [address]       [place], [place]  [address]       Ph: [phone] or [phone]       Fax: [phone]   RxID:   [id number] FLUCONAZOLE 200 MG TABS (FLUCONAZOLE) One by mouth once daily for 30 days  #30 x 3   Entered and Authorized by:   Etta Grandchild MD   Signed by:   Etta Grandchild MD on 12/21/2009   Method used:   Electronically to        [facility] (retail)       [address]       [place], [place]  [address]       Ph: [phone] or [phone]       Fax: [phone]   RxID:   [id number]

## 2010-10-26 NOTE — Progress Notes (Signed)
Summary: rx request   Phone Note Call from Patient   Caller: Patient Reason for Call: Talk to Nurse Summary of Call: pt requesting rx for cealis-ins won't cover viagra-[facility] #[phone] Initial call taken by: Glynda Jaeger,  May 12, 2010 1:50 PM  Follow-up for Phone Call        CMA s/w pt today to advise Dr.McAlhany was not in the office until next Tuesday 8/23 to ask if he will changed rx from viagra to cialis daily use. Pt request rx to be sent to [facility] on [address] once approved by Dr. Clifton James. Pt gave verbal understanding. Danielle Rankin, CMA  May 12, 2010 2:40 PM

## 2010-10-26 NOTE — Letter (Signed)
Summary: Lipid Letter  [facility]  [address] [place], [place] [address]   Phone: [phone]  Fax: [phone]    05/07/2010  Johann Santone [address] [place], [place]  [address]  Dear Lysle Dingwall:  We have carefully reviewed your last lipid profile from 05/07/2010 and the results are noted below with a summary of recommendations for lipid management.    Cholesterol:       99     Goal: <200   HDL "good" Cholesterol:   57.84     Goal: >40   LDL "bad" Cholesterol:   58     Goal: <70   Triglycerides:       74.0     Goal: <150  your blood sugar is normal, other labs and chest xray are normal    Your lipid goals have not been met; we recommend improving on your TLC diet and Adjunctive Measures (see below) and make the following changes to your regimen:    TLC Diet (Therapeutic Lifestyle Change): Saturated Fats & Transfatty acids should be kept < 7% of total calories ***Reduce Saturated Fats Polyunstaurated Fat can be up to 10% of total calories Monounsaturated Fat Fat can be up to 20% of total calories Total Fat should be no greater than 25-35% of total calories Carbohydrates should be 50-60% of total calories Protein should be approximately 15% of total calories Fiber should be at least 20-30 grams a day ***Increased fiber may help lower LDL Total Cholesterol should be < 200mg /day Consider adding plant stanol/sterols to diet (example: Benacol spread) ***A higher intake of unsaturated fat may reduce Triglycerides and Increase HDL    Adjunctive Measures (may lower LIPIDS and reduce risk of Heart Attack) include: Aerobic Exercise (20-30 minutes 3-4 times a week) Limit Alcohol Consumption Weight Reduction Aspirin 75-81 mg a day by mouth (if not allergic or contraindicated) Dietary Fiber 20-30 grams a day by mouth     Current Medications: 1)    Paxil 30 Mg Tabs (Paroxetine hcl) .... Take 2 tablet by mouth once a day 2)    Trazodone Hcl 100 Mg Tabs (Trazodone  hcl) .... Take 3 tab by mouth at bedtime 3)    Coreg 12.5 Mg Tabs (Carvedilol) .Marland Kitchen.. 1 tab two times a day 4)    Xanax 1 Mg Tabs (Alprazolam) .Marland Kitchen.. 1 tab by mouth every 8 hours as needed 5)    Albuterol Sulfate (2.5 Mg/47ml) 0.083% Nebu (Albuterol sulfate) .Marland Kitchen.. 1vial via neb every 4 hrs as needed 6)    Vicodin Hp 10-660 Mg Tabs (Hydrocodone-acetaminophen) .... One by mouth qid as needed for pain 7)    Spiriva Handihaler 18 Mcg Caps (Tiotropium bromide monohydrate) .Marland Kitchen.. 1 inhalation once daily 8)    Crestor 20 Mg Tabs (Rosuvastatin calcium) .... One by mouth once daily for cholesterol 9)    Lamisil 250 Mg Tabs (Terbinafine hcl) .Marland Kitchen.. 1 tab two times a day 10)    Element Control Normal Liqd (Blood glucose calibration) .... Check meter with each new vial of test strips or irregular readings 11)    Element Test  Strp (Glucose blood) .... Test once daily 12)    Lancets  Misc (Lancets) .... Test once daily 13)    Lancing Device  Misc (Lancet devices) .... Teast once daily 14)    Element Auto System  .... Test once daily 15)    Nexium 40 Mg Cpdr (Esomeprazole magnesium) .... One by mouth once daily  If you have any  questions, please call. We appreciate being able to work with you.   Sincerely,     [facility] Etta Grandchild MD

## 2010-10-26 NOTE — Medication Information (Signed)
Summary: [facility]  [facility]   Imported By: Lester [place] 06/28/2010 07:37:49  _____________________________________________________________________  External Attachment:    Type:   Image     Comment:   External Document

## 2010-10-26 NOTE — Progress Notes (Signed)
Summary: Pt request call   Phone Note Call from Patient Call back at [phone]   Summary of Call: Pt request call Initial call taken by: Judie Grieve,  May 19, 2010 10:17 AM  Follow-up for Phone Call        Spoke with pt. He has coupon for free 1 month supply of Cialis which he printed from cialis.com website. He would like to know if it is OK for him to take this and if so can prescription be called to [facility].   Pt does not have coupon with him but states it is for a 30 day supply.  I tried to check cialis.com for coupon information but site is blocked by [facility].  I spoke with RN at [facility] who states she is unable to fax coupon to me but that coupons on website that are available are as follows: 30 tabs of 2.5 or 5mg  to be taken daily or 3 tabs of 10 or 20 mg to be taken as needed. Will discuss with Dr. Clifton James Follow-up by: Dossie Arbour, RN, BSN,  May 19, 2010 4:36 PM  Additional Follow-up for Phone Call Additional follow up Details #1::        We can use the 10 mg as needed dose. thanks, cdm Additional Follow-up by: Verne Carrow, MD,  May 19, 2010 5:25 PM    Additional Follow-up for Phone Call Additional follow up Details #2::    Gave above information to pt. Will call prescription to [facility]. Instructed pt it is to be used as needed 30 - 60 minutes prior to sexual activity and that he is not to take nitrates while taking Cialis. Follow-up by: Dossie Arbour, RN, BSN,  May 19, 2010 5:38 PM

## 2010-10-26 NOTE — Progress Notes (Signed)
Summary: REQ FOR RX  Phone Note Call from Patient Call back at Work Phone [phone]   Summary of Call: Pt says that protonix is not helping w/his acid reflux. ([facility] [address]) Initial call taken by: Lamar Sprinkles, CMA,  March 01, 2010 2:02 PM  Follow-up for Phone Call        Pt informed  Follow-up by: Lamar Sprinkles, CMA,  March 01, 2010 5:41 PM    New/Updated Medications: ACIPHEX 20 MG TBEC (RABEPRAZOLE SODIUM) once daily for acid reflux Prescriptions: ACIPHEX 20 MG TBEC (RABEPRAZOLE SODIUM) once daily for acid reflux  #30 x 11   Entered by:   Lamar Sprinkles, CMA   Authorized by:   Etta Grandchild MD   Signed by:   Lamar Sprinkles, CMA on 03/01/2010   Method used:   Electronically to        [facility] [phone]* (retail)       [address]       [place], [place]  [address]       Ph: [phone]       Fax: [phone]   RxID:   [id number] ACIPHEX 20 MG TBEC (RABEPRAZOLE SODIUM) once daily for acid reflux  #30 x 11   Entered and Authorized by:   Etta Grandchild MD   Signed by:   Etta Grandchild MD on 03/01/2010   Method used:   Electronically to        [facility]* (retail)       [address]       [place], [place]  [address]       Ph: [phone] or [phone]       Fax: [phone]   RxID:   [id number]

## 2010-10-26 NOTE — Assessment & Plan Note (Signed)
Summary: FU---STC   Vital Signs:  Patient profile:   50 year old male Height:      72 inches Weight:      211 pounds BMI:     28.72 O2 Sat:      98 % on Room air Temp:     98.4 degrees F oral Pulse rate:   81 / minute Pulse rhythm:   regular Resp:     16 per minute BP sitting:   124 / 70  (left arm) Cuff size:   large  Vitals Entered By: Rock Nephew CMA (November 16, 2009 9:44 AM)  Nutrition Counseling: Patient's BMI is greater than 25 and therefore counseled on weight management options.  O2 Flow:  Room air  Primary Care Provider:  Etta Grandchild MD   History of Present Illness: HE returns for f/up and is doing well. He is ready to quit smoking and has completed the first month of Chantix with no side effects.  Hypertension History:      He denies headache, chest pain, palpitations, dyspnea with exertion, orthopnea, peripheral edema, visual symptoms, neurologic problems, syncope, and side effects from treatment.  He notes no problems with any antihypertensive medication side effects.        Positive major cardiovascular risk factors include male age 57 years old or older, diabetes, hyperlipidemia, hypertension, and current tobacco user.  Negative major cardiovascular risk factors include negative family history for ischemic heart disease.        Positive history for target organ damage include cardiac end organ damage (either CHF or LVH) and peripheral vascular disease.  Further assessment for target organ damage reveals no history of ASHD, stroke/TIA, renal insufficiency, or hypertensive retinopathy.     Preventive Screening-Counseling & Management  Alcohol-Tobacco     Alcohol drinks/day: 0     Smoking Status: current     Smoking Cessation Counseling: yes     Smoke Cessation Stage: ready     Packs/Day: 1.0     Cans of tobacco/week: no     Passive Smoke Exposure: no     Tobacco Counseling: to quit use of tobacco products  Hep-HIV-STD-Contraception     Hepatitis  Risk: risk noted     Hepatitis Risk Counseling: to avoid increased hepatitis risk     HIV Risk: risk noted     HIV Risk Counseling: to avoid increased HIV risk     STD Risk: risk noted     STD Risk Counseling: to avoid increased STD risk      Sexual History:  multiple partners in the past and not currently active.        Drug Use:  former.        Blood Transfusions:  no.    Medications Prior to Update: 1)  Spiriva Handihaler 18 Mcg Caps (Tiotropium Bromide Monohydrate) .... Inhale Contents of 1 Capsule Once A Day 2)  Abilify 10 Mg Tabs (Aripiprazole) .... Take 1 Tab By Mouth At Bedtime 3)  Paxil 30 Mg Tabs (Paroxetine Hcl) .... Take 2 Tablet By Mouth Once A Day 4)  Trazodone Hcl 100 Mg Tabs (Trazodone Hcl) .... Take 2 Tab By Mouth At Bedtime 5)  Coreg 12.5 Mg Tabs (Carvedilol) .... Take 1 Tablet By Mouth Two Times A Day 6)  Vytorin 10-40 Mg Tabs (Ezetimibe-Simvastatin) .... Take 1 Tab By Mouth At Bedtime 7)  Protonix 40 Mg Tbec (Pantoprazole Sodium) .... Take 1 Tablet By Mouth Twice A Day ( Before Breakfast and Supper) 8)  Chantix Starting Month Pak 0.5 Mg X 11 & 1 Mg X 42 Tabs (Varenicline Tartrate) .... Take As Directed 9)  Zyrtec Allergy 10 Mg Tabs (Cetirizine Hcl) .... Take 1 Tab By Mouth At Bedtime 10)  Pepcid Ac 10 Mg Tabs (Famotidine) .... Take 1 Tab By Mouth At Bedtime 11)  Xanax 1 Mg Tabs (Alprazolam) .Marland Kitchen.. 1 Tab By Mouth Every 8 Hours As Needed 12)  Ventolin Hfa 108 (90 Base) Mcg/act Aers (Albuterol Sulfate) .... Inhale 2 Puffs Every Four Hours As Needed 13)  Albuterol Sulfate (2.5 Mg/41ml) 0.083% Nebu (Albuterol Sulfate) .Marland Kitchen.. 1vial Via Neb Every 4 Hrs As Needed 14)  Vicodin 5-500 Mg Tabs (Hydrocodone-Acetaminophen) .Marland Kitchen.. 1 Tab By Mouth Every 4-6 Hours As Needed 15)  Tussionex Pennkinetic Er 8-10 Mg/45ml Lqcr (Chlorpheniramine-Hydrocodone) .Marland Kitchen.. 1  Teaspoon Every 12 Hours As Needed Cough  Current Medications (verified): 1)  Abilify 10 Mg Tabs (Aripiprazole) .... Take 1 Tab By  Mouth At Bedtime 2)  Paxil 30 Mg Tabs (Paroxetine Hcl) .... Take 2 Tablet By Mouth Once A Day 3)  Trazodone Hcl 100 Mg Tabs (Trazodone Hcl) .... Take 2 Tab By Mouth At Bedtime 4)  Coreg 12.5 Mg Tabs (Carvedilol) .... Take 1 Tablet By Mouth Two Times A Day 5)  Vytorin 10-40 Mg Tabs (Ezetimibe-Simvastatin) .... Take 1 Tab By Mouth At Bedtime 6)  Protonix 40 Mg Tbec (Pantoprazole Sodium) .... Take 1 Tablet By Mouth Twice A Day ( Before Breakfast and Supper) 7)  Pepcid Ac 10 Mg Tabs (Famotidine) .... Take 1 Tab By Mouth At Bedtime 8)  Xanax 1 Mg Tabs (Alprazolam) .Marland Kitchen.. 1 Tab By Mouth Every 8 Hours As Needed 9)  Ventolin Hfa 108 (90 Base) Mcg/act Aers (Albuterol Sulfate) .... Inhale 2 Puffs Every Four Hours As Needed 10)  Albuterol Sulfate (2.5 Mg/9ml) 0.083% Nebu (Albuterol Sulfate) .Marland Kitchen.. 1vial Via Neb Every 4 Hrs As Needed 11)  Vicodin 5-500 Mg Tabs (Hydrocodone-Acetaminophen) .Marland Kitchen.. 1 Tab By Mouth Every 4-6 Hours As Needed 12)  Chantix Continuing Month Pak 1 Mg Tabs (Varenicline Tartrate) .... One By Mouth Bid  Allergies (verified): 1)  ! Ace Inhibitors 2)  ! Pcn 3)  ! * Blood Thiners 4)  ! Plavix (Clopidogrel Bisulfate)  Past History:  Past Medical History: Reviewed history from 10/02/2009 and no changes required. 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 Allyson Sabal)  Family History: Reviewed history from 10/05/2009 and no changes required. Family History of Alcoholism/Addiction Family History of Arthritis Family History Diabetes 3/6 sibs Family History High cholesterol Family History Hypertension Family History of Cardiovascular disorder Family History of Colon Cancer:Mother Family History of Colon Polyps:sisters  Mother alive at 2 with colon cancer,CVA Father deceased MI at age 4  Social History: Reviewed history from 10/23/2009 and no changes required. 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 Alcohol use-no Drug use-no Regular exercise-no Illicit  Drug Use - no Sexual History:  multiple partners in the past, not currently active  Review of Systems  The patient denies anorexia, fever, weight loss, weight gain, hoarseness, syncope, hemoptysis, abdominal pain, hematuria, suspicious skin lesions, abnormal bleeding, enlarged lymph nodes, and angioedema.   Resp:  Denies chest discomfort, chest pain with inspiration, coughing up blood, pleuritic, shortness of breath, sputum productive, and wheezing.  Physical Exam  General:  alert, well-developed, well-nourished, and well-hydrated.   Head:  normocephalic, atraumatic, no abnormalities observed, and no abnormalities palpated.   Eyes:  pink moist mm., no icterus Ears:  R ear normal and L ear normal.   Mouth:  Oral mucosa and oropharynx without lesions or exudates.  Teeth in good repair. Neck:  supple, full ROM, no masses, no thyromegaly, no thyroid nodules or tenderness, no JVD, no carotid bruits, no cervical lymphadenopathy, and no neck tenderness.   Lungs:  normal respiratory effort, no intercostal retractions, no accessory muscle  use, and normal breath sounds.   Heart:  Normal rate and regular rhythm. S1 and S2 normal without gallop, murmur, click, rub or other extra sounds. Abdomen:  soft, non-tender, normal bowel sounds, no distention, no hepatomegaly, and no splenomegaly.   Msk:  normal ROM, no joint tenderness, no joint swelling, no joint warmth, and no redness over joints.   Pulses:  R femoral decreased, R popliteal decreased, R posterior tibial decreased, R dorsalis pedis decreased, L femoral decreased, L popliteal decreased, L posterior tibial decreased, and L dorsalis pedis decreased.   Extremities:  No clubbing, cyanosis, edema, or deformity noted with normal full range of motion of all joints.   Neurologic:  No cranial nerve deficits noted. Station and gait are normal. Plantar reflexes are down-going bilaterally. DTRs are symmetrical throughout. Sensory, motor and coordinative functions appear intact. Skin:  turgor normal, color normal, no rashes, no suspicious lesions, no ecchymoses, no petechiae, and no purpura.   Cervical Nodes:  no anterior cervical adenopathy and no posterior cervical adenopathy.   Psych:  Cognition and judgment appear intact. Alert and cooperative with normal attention span and concentration. No apparent delusions, illusions, hallucinations  Diabetes Management Exam:    Foot Exam (with socks and/or shoes not present):       Sensory-Pinprick/Light touch:          Left medial foot (L-4): normal          Left dorsal foot (L-5): normal          Left lateral foot (S-1): normal          Right medial foot (L-4): normal          Right dorsal foot (L-5): normal          Right lateral foot (S-1): normal       Sensory-Monofilament:          Left foot: normal          Right foot: normal       Inspection:          Left foot: normal          Right foot: normal       Nails:          Left foot: normal          Right foot: normal   Impression & Recommendations:  Problem # 1:  HYPERTENSION,  BENIGN (ICD-401.1) Assessment Improved  His updated medication list for this problem includes:    Coreg 12.5 Mg Tabs (Carvedilol) .Marland Kitchen... Take  1 tablet by mouth two times a day  BP today: 124/70 Prior BP: 108/60 (11/10/2009)  Prior 10 Yr Risk Heart Disease: Not enough information (04/15/2009)  Labs Reviewed: K+: 4.1 (08/14/2009) Creat: : 0.9 (08/14/2009)   Chol: 236 (06/23/2008)   HDL: 30.9 (06/23/2008)   LDL: DEL (06/23/2008)   TG: 135 (06/23/2008)  Problem # 2:  TOBACCO USE (ICD-305.1) Assessment: Improved  The following medications were removed from the medication list:    Chantix Starting Month Pak 0.5 Mg X 11 & 1 Mg X 42 Tabs (Varenicline tartrate) .Marland Kitchen... Take as directed His updated medication list for this problem includes:    Chantix Continuing Month Pak 1 Mg Tabs (Varenicline tartrate) ..... One by mouth bid  Encouraged smoking cessation and discussed different methods for smoking cessation.   Orders: Prescription Created Electronically [phone]) Tobacco use cessation intensive >10 minutes ([address])  Problem # 3:  DIAB W/UNS COMP TYPE II/UNS NOT STATED UNCNTRL (ICD-250.90) Assessment: Improved  Labs Reviewed: Creat: 0.9 (08/14/2009)    Reviewed HgBA1c results: 6.2 (08/14/2009)  6.6 (05/08/2009)  Problem # 4:  COPD (ICD-496) Assessment: Improved  The following medications were removed from the medication list:    Spiriva Handihaler 18 Mcg Caps (Tiotropium bromide monohydrate) ..... Inhale contents of 1 capsule once a day His updated medication list for this problem includes:    Ventolin Hfa 108 (90 Base) Mcg/act Aers (Albuterol sulfate) ..... Inhale 2 puffs every four hours as needed    Albuterol Sulfate (2.5 Mg/7ml) 0.083% Nebu (Albuterol sulfate) .Marland Kitchen... 1vial via neb every 4 hrs as needed  Orders: Prescription Created Electronically [phone]) Tobacco use cessation intensive >10 minutes ([address])  Problem # 5:  COUGH (ICD-786.2) Assessment:  Unchanged  Orders: Prescription Created Electronically [phone]) Tobacco use cessation intensive >10 minutes (7[address])  Complete Medication List: 1)  Abilify 10 Mg Tabs (Aripiprazole) .... Take 1 tab by mouth at bedtime 2)  Paxil 30 Mg Tabs (Paroxetine hcl) .... Take 2 tablet by mouth once a day 3)  Trazodone Hcl 100 Mg Tabs (Trazodone hcl) .... Take 2 tab by mouth at bedtime 4)  Coreg 12.5 Mg Tabs (Carvedilol) .... Take 1 tablet by mouth two times a day 5)  Vytorin 10-40 Mg Tabs (Ezetimibe-simvastatin) .... Take 1 tab by mouth at bedtime 6)  Protonix 40 Mg Tbec (Pantoprazole sodium) .... Take 1 tablet by mouth twice a day ( before breakfast and supper) 7)  Pepcid Ac 10 Mg Tabs (Famotidine) .... Take 1 tab by mouth at bedtime 8)  Xanax 1 Mg Tabs (Alprazolam) .Marland Kitchen.. 1 tab by mouth every 8 hours as needed 9)  Ventolin Hfa 108 (90 Base) Mcg/act Aers (Albuterol sulfate) .... Inhale 2 puffs every four hours as needed 10)  Albuterol Sulfate (2.5 Mg/56ml) 0.083% Nebu (Albuterol sulfate) .Marland Kitchen.. 1vial via neb every 4 hrs as needed 11)  Vicodin 5-500 Mg Tabs (Hydrocodone-acetaminophen) .Marland Kitchen.. 1 tab by mouth every 4-6 hours as needed 12)  Chantix Continuing Month Pak 1 Mg Tabs (Varenicline tartrate) .... One by mouth bid  Hypertension Assessment/Plan:      The patient's hypertensive risk group is category C: Target organ damage and/or diabetes.  Today's blood pressure is 124/70.  His blood pressure goal is < 130/80.  Patient Instructions: 1)  Please schedule a follow-up appointment in 3 months. 2)  Tobacco is very bad for your health and your loved ones! You Should stop smoking!. 3)  Stop Smoking Tips: Choose a Quit date. Cut down before the Quit date. decide  what you will do as a substitute when you feel the urge to smoke(gum,toothpick,exercise). 4)  It is important that you exercise regularly at least 20 minutes 5 times a week. If you develop chest pain, have severe difficulty breathing, or feel very  tired , stop exercising immediately and seek medical attention. 5)  You need to lose weight. Consider a lower calorie diet and regular exercise.  6)  If you could be exposed to sexually transmitted diseases, you should use a condom. 7)  Check your Blood Pressure regularly. If it is above 130/80: you should make an appointment. Prescriptions: CHANTIX CONTINUING MONTH PAK 1 MG TABS (VARENICLINE TARTRATE) One by mouth BID  #60 x 4   Entered and Authorized by:   Etta Grandchild MD   Signed by:   Etta Grandchild MD on 11/16/2009   Method used:   Electronically to        [facility]* (retail)       [address]       [place], [place]  [address]       Ph: [phone] or [phone]       Fax: [phone]   RxID:   [id number]

## 2010-10-26 NOTE — Progress Notes (Signed)
Summary: cough surup  Phone Note Call from Patient   Caller: Patient Call For: byrum Summary of Call: would like cough syrup  [facility] Initial call taken by: Rickard Patience,  November 10, 2009 3:41 PM  Follow-up for Phone Call        MR pt--Pt saw RB today for dry cough. Pt request rx for cough syrup. Please advise. Carron Curie CMA  November 10, 2009 3:55 PM  per SN give tussionex #4 ox 1 tsp every 12 hours as needed cough no refills. Carron Curie CMA  November 10, 2009 5:07 PM rx sent. pt aware. Carron Curie CMA  November 10, 2009 5:09 PM     New/Updated Medications: Sandria Senter ER 8-10 MG/5ML LQCR (CHLORPHENIRAMINE-HYDROCODONE) 1  teaspoon every 12 hours as needed cough Prescriptions: TUSSIONEX PENNKINETIC ER 8-10 MG/5ML LQCR (CHLORPHENIRAMINE-HYDROCODONE) 1  teaspoon every 12 hours as needed cough  #4 oz x 0   Entered by:   Carron Curie CMA   Authorized by:   Michele Mcalpine MD   Signed by:   Carron Curie CMA on 11/10/2009   Method used:   Telephoned to ...       [facility] (retail)       [address]       [place], [place]  [address]       Ph: [phone]       Fax: [phone]   RxID:   [id number]   Appended Document: cough surup pt states pharmacy told him they did not receive rx for cough syrup. I called the pharmacy and they had not pulled messages yet, so they do have rx. LMTCB to advise pt.   Appended Document: cough surup pt advised rx at pharmacy.

## 2010-10-26 NOTE — Procedures (Signed)
[facility]   Imported By: Lester Chelyan 11/19/2009 08:31:46  _____________________________________________________________________  External Attachment:    Type:   Image     Comment:   External Document

## 2010-10-26 NOTE — Progress Notes (Signed)
  Phone Note Other Incoming   Caller: [facility] Summary of Call: sherry from lab called and states that [name] will no execpt diagnoses code given for chlamydia test. Do you have another diagnoses code Initial call taken by: Ami Bullins CMA,  April 12, 2010 9:56 AM  Follow-up for Phone Call        no Follow-up by: Etta Grandchild MD,  April 12, 2010 10:03 AM  Additional Follow-up for Phone Call Additional follow up Details #1::        Informed sherry in the lab Additional Follow-up by: Ami Bullins CMA,  April 12, 2010 11:17 AM

## 2010-10-26 NOTE — Progress Notes (Signed)
Phone Note Refill Request Message from:  Fax from Pharmacy on April 05, 2010 3:06 PM  Refills Requested: Medication #1:  SYMBICORT 160-4.5 MCG/ACT AERO Inhale 2 puffs two times a day  Medication #2:  VICODIN HP 10-660 MG TABS One by mouth three times a day as needed for pain  Medication #3:  PROAIR HFA 108 (90 BASE) MCG/ACT AERS 2 puffs every 4-6 hours as needed for SOB Patient called stating that he will now be filling meds through (Mail order) [facility].  Caller: Patient    New/Updated Medications: ELEMENT CONTROL NORMAL LIQD (BLOOD GLUCOSE CALIBRATION) Check meter with each new vial of test strips or irregular readings ELEMENT TEST  STRP (GLUCOSE BLOOD) Test once daily LANCETS  MISC (LANCETS) Test once daily LANCING DEVICE  MISC (LANCET DEVICES) Teast once daily * ELEMENT AUTO SYSTEM Test once daily Prescriptions: PROAIR HFA 108 (90 BASE) MCG/ACT AERS (ALBUTEROL SULFATE) 2 puffs every 4-6 hours as needed for SOB  #74mo x 3   Entered by:   Rock Nephew CMA   Authorized by:   Etta Grandchild MD   Signed by:   Rock Nephew CMA on 04/05/2010   Method used:   Faxed to ...       [facility] (mail-order)       [address]       [place], [place]         Ph: [phone]       Fax: [phone]   RxID:   [id number] ELEMENT AUTO SYSTEM Test once daily  #1 x 0   Entered by:   Rock Nephew CMA   Authorized by:   Etta Grandchild MD   Signed by:   Rock Nephew CMA on 04/05/2010   Method used:   Faxed to ...       [facility] (mail-order)       [address] [place], [place]         Ph: [phone]       Fax: [phone]   RxID:   [id number] SYMBICORT 160-4.5 MCG/ACT AERO (BUDESONIDE-FORMOTEROL FUMARATE) Inhale 2 puffs two times a day  #75mo x 3   Entered by:   Rock Nephew CMA   Authorized by:   Etta Grandchild MD   Signed by:   Rock Nephew CMA on 04/05/2010   Method used:   Faxed to ...       [facility] (mail-order)       [address] [place], [place]         Ph: [phone]       Fax: [phone]   RxID:   [id number] ELEMENT CONTROL NORMAL LIQD (BLOOD GLUCOSE CALIBRATION) Check meter with each new vial of test strips or irregular readings  #1 x 3   Entered by:   Rock Nephew CMA   Authorized by:   Etta Grandchild MD   Signed by:   Rock Nephew CMA on 04/05/2010   Method used:   Faxed to ...       [facility] (mail-order)       [address] [place], [place]         Ph: [phone]       Fax: [phone]   RxID:   [id number] ELEMENT TEST  STRP (GLUCOSE BLOOD) Test once daily  #90 x 3   Entered by:  Rock Nephew CMA   Authorized by:   Etta Grandchild MD   Signed by:   Rock Nephew CMA on 04/05/2010   Method used:   Faxed to ...       [facility] (mail-order)       [address]       [place], [place]         Ph: [phone]       Fax: [phone]   RxID:   [id number] LANCETS  MISC (LANCETS) Test once daily  #90 x 3   Entered by:   Rock Nephew CMA   Authorized by:   Etta Grandchild MD   Signed by:   Rock Nephew CMA on 04/05/2010   Method used:   Faxed to ...       [facility] (mail-order)       [address]       [place], [place]         Ph: [phone]       Fax: [phone]   RxID:   [id number] LANCING DEVICE  MISC (LANCET DEVICES) Teast once daily  #1 x 0   Entered by:   Rock Nephew CMA   Authorized by:   Etta Grandchild MD   Signed by:   Rock Nephew CMA on 04/05/2010   Method used:   Faxed to ...       [facility] (mail-order)       [address]       [place], [place]         Ph: [phone]       Fax: [phone]   RxID:   [id number]

## 2010-10-26 NOTE — Assessment & Plan Note (Signed)
Summary: npv/PVD  Medications Added LISINOPRIL 10 MG TABS (LISINOPRIL) 1 tab once daily ASPIRIN 81 MG TBEC (ASPIRIN) Take one tablet by mouth daily        Visit Type:  new pt visit Referring Provider:  DR, Yetta Barre Primary Provider:  Etta Grandchild MD  CC:  leg pain...  History of Present Illness: 50 yo AAM with DM, HTN, hyperlipidemia, NICM with EF of 25% and normal coronary arteries by cath 2008, PVD and tobacco abuse here today to establish cardiology care and PV care. 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 Allyson Sabal 07/28/09. He is unhappy with his care in their office and wishes to change to [place] office. He stopped taking his Plavix three weeks after his stent was placed because of nose bleeds. He also does not take ASA because it makes his nose bleed.   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 Dr. Allyson Sabal stented the left iliac artery and since then, he has had pain in his left calf muscle with ambulation. Records indicate left thigh claudication prior to the procedure.  He gets cramping in the left calf with walking and this eases off with rest. Sometimes he has rest pain in both legs. His history is vague. No SOB or CP. He continues to smoke daily.   Current Medications (verified): 1)  Coreg 12.5 Mg Tabs (Carvedilol) .... Take 1 Tablet By Mouth Two Times A Day 2)  Novolog Flexpen 100 Unit/ml Soln (Insulin Aspart) .... 6unit Before Breakfast, 8unts Before Lunch, 6units Before Supper 3)  Vicodin 5-500 Mg Tabs (Hydrocodone-Acetaminophen) .... Four Times A Day As Needed 4)  Ventolin Hfa 108 (90 Base) Mcg/act Aers (Albuterol Sulfate) .... 2 Puff Up To Qid 5)  Clozapine 100 Mg Tabs (Clozapine) .... 2am and 3 Pm 6)  Xanax 1 Mg Tabs (Alprazolam) .... Take 1 Tablet By Mouth Three Times A Day 7)  Paxil 30 Mg Tabs (Paroxetine Hcl) .... Two Tablets By Mouth Once Daily 8)  Onetouch Ultra Test  Strp  (Glucose Blood) .... Qid, and Lancets 250.01.  Variable Glucoses 9)  Bd Ultra-Fine Pen Needles 29g X 12.6mm Misc (Insulin Pen Needle) .... Qid 10)  Vytorin 10-40 Mg Tabs (Ezetimibe-Simvastatin) .... Once Daily 11)  Protonix 40 Mg Tbec (Pantoprazole Sodium) .... Take 1 Tablet By Mouth Twice A Day ( Before Breakfast and Supper) 12)  Spiriva Handihaler 18 Mcg Caps (Tiotropium Bromide Monohydrate) .... One Puff Once Daily 13)  Trazodone Hcl 100 Mg Tabs (Trazodone Hcl) .... One By Mouth Qhs 14)  Promethazine-Dm 6.25-15 Mg/55ml Syrp (Promethazine-Dm) .... 5-10 Ml By Mouth Qid As Needed For Cough 15)  Lisinopril 10 Mg Tabs (Lisinopril) .Marland Kitchen.. 1 Tab Once Daily  Allergies: 1)  ! Ace Inhibitors  Past History:  Past Medical History: Reviewed history from 10/02/2009 and no changes required. 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 Allyson Sabal)  Family History: Family History of Alcoholism/Addiction Family History of Arthritis Family History Diabetes 3/6 sibs Family History High cholesterol Family History Hypertension Family History of Cardiovascular disorder Family History of Colon Cancer:Mother Family History of Colon  Polyps:sisters  Mother alive at 21 with colon cancer,CVA Father deceased MI at age 3  Social History: Reviewed history from 12/03/2008 and no changes required. Disabled due to paranoid/schizophrenia Single Current Smoker 1ppd Alcohol use-no Drug use-no Regular exercise-no Illicit Drug Use - no  Review of Systems  The patient denies fatigue, malaise, fever, weight gain/loss, vision loss, decreased hearing, hoarseness, chest pain, palpitations, shortness of breath, prolonged cough, wheezing, sleep apnea, coughing up blood, abdominal pain, blood in stool, nausea, vomiting, diarrhea, heartburn, incontinence, blood in urine, muscle weakness, joint pain, leg swelling, rash, skin lesions, headache, fainting, dizziness, depression, anxiety, enlarged lymph nodes, easy bruising or bleeding, and environmental allergies.         Bilateral leg pain with ambulation.   Vital Signs:  Patient profile:   50 year old male Height:      72 inches Weight:      213 pounds BMI:     28.99 Pulse rate:   82 / minute Pulse rhythm:   regular BP sitting:   100 / 54  (left arm) Cuff size:   large  Vitals Entered By: Danielle Rankin, CMA (October 05, 2009 10:26 AM)  Physical Exam  General:  .General: Well developed, well nourished, NAD HEENT: OP clear, mucus membranes moist SKIN: warm, dry Neuro: No focal deficits Musculoskeletal: Muscle strength 5/5 all ext Psychiatric: Mood and affect normal Neck: No JVD, no carotid bruits, no thyromegaly, no lymphadenopathy. Lungs:Clear bilaterally, no wheezes, rhonci, crackles CV: RRR no murmurs, gallops rubs Abdomen: soft, NT, ND, BS present Extremities: No edema, pulses 1-2+ in bilateral DP/PT and radial arteries.     EKG  Procedure date:  10/05/2009  Findings:      NSR, rate 82 bpm.   Arteriogram-Lower Extremity  Procedure date:  07/28/2009  Findings:      1. Renal artery is normal. 2. Infrarenal done, abdominal aorta normal. 3. Left lower  extremity:  Total left common iliac artery at its origin     with reconstitution distally just above the internal external     bifurcation with 3 vessel runoff. 4. Right lower extremity:  Normal with 3 vessel runoff.   PROCEDURE DESCRIPTION:  The patient received a total of 5000 units of heparin intravenously as well as Angiomax bolus and infusion.  Attempts were made to cross the occlusion antegrade using a 6-French IMA short catheter and a SOS catheter along with a stiff-angle glide.  I was able to puncture the proximal cap, but was unable to enter the true lumen distally.  Following this, the 5-French sheath in the left femoral artery was exchanged over [facility] wire for a 6-French bright tip.  The [facility] wire was advanced into the chronic total occlusion through the Quick-Cross and this was exchanged for an angled glidewire which then was passed into the true lumen of the aorta, documented angiographically and by pressure waveform.  Following this, the occluded segment was dilated with a 4 x 6 Powerflex and the ostium was stented with a 7 x 18 Genesis on OPTA balloon stent premount.  The remainder of the vessel was stented with an 8 x 4 SMART self-expanding stent.  There was no antegrade flow and a pressure gradient documented with abdominal aortography  revealing occlusion at the origin.  The assumption was that the initial stent had not either entered the true lumen or there was an obstruction proximal.  There was also what appears to be filling defects within the stented segment, possibly related to inflow issue.  Based on this, the entire segment was then restented with an 8 x 6 SMART stent covering the entire segment with stent that extended more proximally and more distally.  The ostium was dilated with a 7.4 balloon which resulted in abolition of the gradient and restoration of the antegrade flow.   Impression & Recommendations:  Problem # 1:  PVD (ICD-443.9) The patient  recently underwent placement of stents in the totally occluded left common iliac artery extending from the ostium down beyond the bifurcation into the external and internal iliac arteries. (Dr. Allyson Sabal). He has done well since then. Pulses are patent in the bilateral DP and PT arteries. His pain does not sound like claudication. He did not have significant disease in the infrainguinal vessels as demonstrated during the angiogram by Dr. Allyson Sabal in November 2010. Will get lower extremity arterial dopplers to assess flow and document current ABI. If this is abnormal, will plan CTA to evaluate stents in left common iliac artery. Will start him on ASA 81 mg per day. He does not wish to take Plavix because of nose bleeds that started while on Plavix. The nose bleeds have not recurred since stopping the Plavix. Pt to continue walking daily. We will get records from [facility] office. Smoking cessation strongly encouraged.   Problem # 2:  CARDIOMYOPATHY, SECONDARY (ICD-425.9) Volume status ok. Continue current meds.   His updated medication list for this problem includes:    Coreg 12.5 Mg Tabs (Carvedilol) .Marland Kitchen... Take 1 tablet by mouth two times a day    Lisinopril 10 Mg Tabs (Lisinopril) .Marland Kitchen... 1 tab once daily    Aspirin 81 Mg Tbec (Aspirin) .Marland Kitchen... Take one tablet by mouth daily  His updated medication list for this problem includes:    Coreg 12.5 Mg Tabs (Carvedilol) .Marland Kitchen... Take 1 tablet by mouth two times a day    Lisinopril 10 Mg Tabs (Lisinopril) .Marland Kitchen... 1 tab once daily  Problem # 3:  TOBACCO USE (ICD-305.1) Smoking cessation encouraged.   Problem # 4:  HYPERTENSION, BENIGN (ICD-401.1) Well controlled currently. Continue current therapy.   His updated medication list for this problem includes:    Coreg 12.5 Mg Tabs (Carvedilol) .Marland Kitchen... Take 1 tablet by mouth two times a day    Lisinopril 10 Mg Tabs (Lisinopril) .Marland Kitchen... 1 tab once daily    Aspirin 81 Mg Tbec (Aspirin) .Marland Kitchen... Take one tablet by mouth daily  His  updated medication list for this problem includes:    Coreg 12.5 Mg Tabs (Carvedilol) .Marland Kitchen... Take 1 tablet by mouth two times a day    Lisinopril 10 Mg Tabs (Lisinopril) .Marland Kitchen... 1 tab once daily  Other Orders: EKG w/ Interpretation (93000) Arterial Duplex Lower Extremity (Arterial Duplex Low)  Patient Instructions: 1)  Your physician recommends that you schedule a follow-up appointment in: 6 months 2)  Your physician has requested that you have a lower or upper extremity arterial duplex.  This test is an ultrasound of the arteries in the legs or arms.  It looks at arterial blood flow in the legs and arms.  Allow one hour for Lower and Upper Arterial scans. There are no restrictions or special instructions. 3)  Your physician has recommended you make the following change  in your medication: Start enteric coated asprin 81 mg daily Prescriptions: LISINOPRIL 10 MG TABS (LISINOPRIL) 1 tab once daily  #30 x 11   Entered by:   Dossie Arbour, RN, BSN   Authorized by:   Verne Carrow, MD   Signed by:   Dossie Arbour, RN, BSN on 10/05/2009   Method used:   Electronically to        [facility] (retail)       [address]       [place], [place]  [id number]       Ph: [phone]       Fax: [phone]   RxID:   [id number] VYTORIN 10-40 MG TABS (EZETIMIBE-SIMVASTATIN) once daily  #30 x 11   Entered by:   Dossie Arbour, RN, BSN   Authorized by:   Verne Carrow, MD   Signed by:   Dossie Arbour, RN, BSN on 10/05/2009   Method used:   Electronically to        [facility] (retail)       [address]       [place], [place]  [address]       Ph: [phone]       Fax: [phone]   RxID:   [id number] COREG 12.5 MG TABS (CARVEDILOL) Take 1 tablet by mouth two times a day  #60 x 11   Entered by:   Dossie Arbour, RN, BSN   Authorized by:   Verne Carrow, MD   Signed by:   Dossie Arbour, RN,  BSN on 10/05/2009   Method used:   Electronically to        [facility] (retail)       [address]       [place], [place]  [address]       Ph: [phone]       Fax: [phone]   RxID:   [id number]

## 2010-10-26 NOTE — Progress Notes (Signed)
Summary: sliding scale  Phone Note Call from Patient   Caller: Patient Call For: Etta Grandchild MD Summary of Call: Patient called stating that he was seen and d/c from hospital (stent placemet) in November with an insulin sliding scale. He has misplaced the sheet given but remembers to take  "2units if CBG is 150". I advised pt that there is no form or set sliding scale, everyone if different and he should follow up with Dr Everardo All. Appt set and he will call if needed sooner Initial call taken by: Rock Nephew CMA,  October 05, 2009 1:52 PM

## 2010-10-26 NOTE — Letter (Signed)
Summary: [facility]   Imported By: [place] 11/05/2009 09:40:42  _____________________________________________________________________  External Attachment:    Type:   Image     Comment:   External Document

## 2010-10-26 NOTE — Miscellaneous (Signed)
Summary: [facility]  [facility]   Imported By: Sherian Rein 06/17/2010 08:02:13  _____________________________________________________________________  External Attachment:    Type:   Image     Comment:   External Document

## 2010-10-26 NOTE — Medication Information (Signed)
Summary: Nebul[facility] Med[facility]  [facility]   Imported By: Sherian Rein 03/05/2010 10:04:13  _____________________________________________________________________  External Attachment:    Type:   Image     Comment:   External Document

## 2010-10-26 NOTE — Progress Notes (Signed)
Summary: HIV testing  Phone Note Call from Patient Call back at Home Phone [phone]   Caller: Patient Reason for Call: Talk to Nurse Summary of Call: pt request results of HIV testing Initial call taken by: Migdalia Dk,  May 12, 2010 9:14 AM  Follow-up for Phone Call        done Follow-up by: Etta Grandchild MD,  May 12, 2010 9:28 AM

## 2010-10-26 NOTE — Miscellaneous (Signed)
Summary: Orders Update pft charges  Clinical Lists Changes  Orders: Added new Service order of Carbon Monoxide diffusing w/capacity (94720) - Signed Added new Service order of Lung Volumes (94240) - Signed Added new Service order of Spirometry (Pre & Post) (94060) - Signed 

## 2010-10-26 NOTE — Progress Notes (Signed)
Summary: xanax rx  Phone Note Refill Request Message from:  Fax from Pharmacy on September 28, 2009 12:45 PM  Refills Requested: Medication #1:  XANAX 1 MG TABS Take 1 tablet by mouth three times a day Per pharmacy, pt used to get filled at [facility] but now see Dr Yetta Barre. Is this ok refill?  Initial call taken by: Rock Nephew CMA,  September 28, 2009 12:46 PM Caller: [facility]  Follow-up for Phone Call        yes Follow-up by: Etta Grandchild MD,  September 28, 2009 12:50 PM    Prescriptions: Prudy Feeler 1 MG TABS (ALPRAZOLAM) Take 1 tablet by mouth three times a day  #90 x 5   Entered by:   Rock Nephew CMA   Authorized by:   Etta Grandchild MD   Signed by:   Rock Nephew CMA on 09/29/2009   Method used:   Telephoned to ...       [facility] (retail)       [address]       [place], [place]  [id number]       Ph: [phone]       Fax: [phone]   RxID:   [id number]

## 2010-10-26 NOTE — Progress Notes (Signed)
Summary: new neb & results  Phone Note Call from Patient Call back at Home Phone [phone] Call back at [phone]   Caller: Patient Call For: Julia Kulzer Reason for Call: Talk to Nurse, Talk to Doctor Summary of Call: can he get a new nebulizer from [facility]?  His old one is on it's last leg.  Also, did pft today and would like results. Initial call taken by: Eugene Gavia,  October 28, 2009 11:56 AM  Follow-up for Phone Call        is it okay to palce order for new neb machine? Alos pt requesting results of PFT doen today. Carron Curie CMA  October 28, 2009 12:09 PM   please call pt and let him know if ok for new neb machine. Follow-up by: Eugene Gavia,  October 28, 2009 2:11 PM  Additional Follow-up for Phone Call Additional follow up Details #1::        Needs to see Tammy first before order for new neb machine. Tamy Parret will reivew PFTs but in brief they show that he has COPD but appears improved with spiriva. THere is element of what we call 'restriction' and could be from prior surgery in chest which he never had, or pain issues or diabetes related. Best explained face to face Additional Follow-up by: Kalman Shan MD,  October 29, 2009 2:21 PM    Additional Follow-up for Phone Call Additional follow up Details #2::    Pt advised of recs and reminded of appt with TP on 11/02/09. Carron Curie CMA  October 30, 2009 10:57 AM
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
## 2010-10-26 NOTE — Assessment & Plan Note (Signed)
Summary: FU / FLU SHOT Natale Milch  #   Vital Signs:  Patient profile:   50 year old male Height:      75 inches Weight:      217 pounds BMI:     27.22 O2 Sat:      98 % on Room air Temp:     98.1 degrees F oral Pulse rate:   83 / minute Pulse rhythm:   regular Resp:     16 per minute BP sitting:   118 / 76  (left arm) Cuff size:   large  Vitals Entered By: Rock Nephew CMA (August 05, 2010 9:14 AM)  Nutrition Counseling: Patient's BMI is greater than 25 and therefore counseled on weight management options.  O2 Flow:  Room air  Primary Care Provider:  Etta Grandchild MD   History of Present Illness:  Follow-Up Visit      This is a 50 year old man who presents for Follow-up visit.  The patient denies chest pain, palpitations, dizziness, syncope, low blood sugar symptoms, high blood sugar symptoms, edema, SOB, DOE, PND, and orthopnea.  Since the last visit the patient notes no new problems or concerns.  The patient reports taking meds as prescribed, monitoring BP, monitoring blood sugars, and dietary compliance.  When questioned about possible medication side effects, the patient notes none.    Lipid Management History:      Positive NCEP/ATP III risk factors include male age 73 years old or older, diabetes, HDL cholesterol less than 40, current tobacco user, hypertension, and peripheral vascular disease.  Negative NCEP/ATP III risk factors include no family history for ischemic heart disease, no ASHD (atherosclerotic heart disease), no prior stroke/TIA, and no history of aortic aneurysm.        The patient states that he knows about the "Therapeutic Lifestyle Change" diet.  His compliance with the TLC diet is poor.  The patient expresses understanding of adjunctive measures for cholesterol lowering.  Adjunctive measures started by the patient include fiber, ASA, limit alcohol consumpton, and weight reduction.  He expresses no side effects from his lipid-lowering medication.  The patient  denies any symptoms to suggest myopathy or liver disease.     Preventive Screening-Counseling & Management  Alcohol-Tobacco     Alcohol drinks/day: 0     Alcohol Counseling: not indicated; patient does not drink     Smoking Status: current     Smoking Cessation Counseling: yes     Smoke Cessation Stage: contemplative     Packs/Day: 1.0 - 1.5     Year Started: 1974     Cans of tobacco/week: no     Passive Smoke Exposure: no     Tobacco Counseling: to quit use of tobacco products  Hep-HIV-STD-Contraception     Hepatitis Risk: risk noted     Hepatitis Risk Counseling: to avoid increased hepatitis risk     HIV Risk: risk noted     HIV Risk Counseling: to avoid increased HIV risk     STD Risk: risk noted     STD Risk Counseling: to avoid increased STD risk      Sexual History:  multiple partners currently.        Drug Use:  never.        Blood Transfusions:  no.    Clinical Review Panels:  Prevention   Last Colonoscopy:  1) Two polyps removed, maximum size 1 cm. TUBULAR ADENOMAS 2) Otherwise normal examination 3) 9 adenomas removed in 11/2007 (01/22/2009)  Last PSA:  0.47 (12/21/2009)  Immunizations   Last Tetanus Booster:  Td (04/09/2003)   Last Flu Vaccine:  Fluvax 3+ (08/05/2010)   Last Pneumovax:  Pneumovax (12/06/2007)  Lipid Management   Cholesterol:  99 (05/07/2010)   LDL (bad choesterol):  58 (05/07/2010)   HDL (good cholesterol):  26.60 (05/07/2010)  Diabetes Management   HgBA1C:  6.2 (05/07/2010)   Creatinine:  0.8 (05/07/2010)   Last Foot Exam:  yes (08/05/2010)   Last Flu Vaccine:  Fluvax 3+ (08/05/2010)   Last Pneumovax:  Pneumovax (12/06/2007)  CBC   WBC:  8.8 (05/07/2010)   RBC:  5.39 (05/07/2010)   Hgb:  17.1 (05/07/2010)   Hct:  49.6 (05/07/2010)   Platelets:  119.0 (05/07/2010)   MCV  92.2 (05/07/2010)   MCHC  34.5 (05/07/2010)   RDW  14.9 (05/07/2010)   PMN:  63.8 (05/07/2010)   Lymphs:  27.6 (05/07/2010)   Monos:  7.1 (05/07/2010)    Eosinophils:  1.1 (05/07/2010)   Basophil:  0.4 (05/07/2010)  Complete Metabolic Panel   Glucose:  89 (05/07/2010)   Sodium:  143 (05/07/2010)   Potassium:  4.3 (05/07/2010)   Chloride:  108 (05/07/2010)   CO2:  29 (05/07/2010)   BUN:  10 (05/07/2010)   Creatinine:  0.8 (05/07/2010)   Albumin:  4.5 (05/07/2010)   Total Protein:  7.2 (05/07/2010)   Calcium:  9.4 (05/07/2010)   Total Bili:  0.6 (05/07/2010)   Alk Phos:  71 (05/07/2010)   SGPT (ALT):  24 (05/07/2010)   SGOT (AST):  18 (05/07/2010)   Medications Prior to Update: 1)  Paxil 30 Mg Tabs (Paroxetine Hcl) .... Take 2 Tablet By Mouth Once A Day 2)  Trazodone Hcl 100 Mg Tabs (Trazodone Hcl) .... Take 3 Tab By Mouth At Bedtime 3)  Coreg 12.5 Mg Tabs (Carvedilol) .Marland Kitchen.. 1 Tab Two Times A Day 4)  Xanax 1 Mg Tabs (Alprazolam) .Marland Kitchen.. 1 Tab By Mouth Every 8 Hours As Needed 5)  Albuterol Sulfate (2.5 Mg/63ml) 0.083% Nebu (Albuterol Sulfate) .Marland Kitchen.. 1vial Via Neb Every 4 Hrs As Needed 6)  Vicodin Hp 10-660 Mg Tabs (Hydrocodone-Acetaminophen) .... One By Mouth Qid As Needed For Pain 7)  Spiriva Handihaler 18 Mcg Caps (Tiotropium Bromide Monohydrate) .Marland Kitchen.. 1 Inhalation Once Daily 8)  Crestor 20 Mg Tabs (Rosuvastatin Calcium) .... One By Mouth Once Daily For Cholesterol 9)  Lamisil 250 Mg Tabs (Terbinafine Hcl) .Marland Kitchen.. 1 Tab Two Times A Day 10)  Element Control Normal Liqd (Blood Glucose Calibration) .... Check Meter With Each New Vial of Test Strips or Irregular Readings 11)  Element Test  Strp (Glucose Blood) .... Test Once Daily 12)  Lancets  Misc (Lancets) .... Test Once Daily 13)  Lancing Device  Misc (Lancet Devices) .... Teast Once Daily 14)  Element Auto System .... Test Once Daily 15)  Nexium 40 Mg Cpdr (Esomeprazole Magnesium) .... One By Mouth Once Daily 16)  Astepro 0.15 % Soln (Azelastine Hcl) .Marland Kitchen.. 1 To 2 Sprays in Each Nostril Up To Two Times A Day As Needed For Itching and Allergy 17)  Viagra 50 Mg Tabs (Sildenafil Citrate) .... As  Needed  Current Medications (verified): 1)  Paxil 30 Mg Tabs (Paroxetine Hcl) .... Take 2 Tablet By Mouth Once A Day 2)  Trazodone Hcl 100 Mg Tabs (Trazodone Hcl) .... Take 3 Tab By Mouth At Bedtime 3)  Coreg 12.5 Mg Tabs (Carvedilol) .Marland Kitchen.. 1 Tab Two Times A Day 4)  Xanax 1 Mg Tabs (Alprazolam) .Marland KitchenMarland KitchenMarland Kitchen  1 Tab By Mouth Every 8 Hours As Needed 5)  Albuterol Sulfate (2.5 Mg/67ml) 0.083% Nebu (Albuterol Sulfate) .Marland Kitchen.. 1vial Via Neb Every 4 Hrs As Needed 6)  Vicodin Hp 10-660 Mg Tabs (Hydrocodone-Acetaminophen) .... One By Mouth Qid As Needed For Pain 7)  Spiriva Handihaler 18 Mcg Caps (Tiotropium Bromide Monohydrate) .Marland Kitchen.. 1 Inhalation Once Daily 8)  Crestor 20 Mg Tabs (Rosuvastatin Calcium) .... One By Mouth Once Daily For Cholesterol 9)  Element Control Normal Liqd (Blood Glucose Calibration) .... Check Meter With Each New Vial of Test Strips or Irregular Readings 10)  Element Test  Strp (Glucose Blood) .... Test Once Daily 11)  Lancets  Misc (Lancets) .... Test Once Daily 12)  Lancing Device  Misc (Lancet Devices) .... Teast Once Daily 13)  Element Auto System .... Test Once Daily 14)  Nexium 40 Mg Cpdr (Esomeprazole Magnesium) .... One By Mouth Once Daily 15)  Astepro 0.15 % Soln (Azelastine Hcl) .Marland Kitchen.. 1 To 2 Sprays in Each Nostril Up To Two Times A Day As Needed For Itching and Allergy 16)  Viagra 50 Mg Tabs (Sildenafil Citrate) .... As Needed  Allergies (verified): 1)  ! Ace Inhibitors 2)  ! Pcn 3)  ! * Blood Thiners 4)  ! 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 Alcohol use-no Drug use-no Regular exercise-no Illicit Drug Use - no  Risk Factors: Alcohol Use: 0 (08/05/2010) Exercise: yes (06/23/2008)  Risk Factors: Smoking Status: current (08/05/2010) Packs/Day: 1.0 - 1.5 (08/05/2010) Cans of tobacco/wk: no (08/05/2010) Passive Smoke Exposure: no (08/05/2010)  Family History: Reviewed history from 10/05/2009 and no changes required. Family History of Alcoholism/Addiction Family History of Arthritis Family History Diabetes 3/6 sibs Family History High cholesterol Family History Hypertension Family History of Cardiovascular disorder Family History of Colon Cancer:Mother Family History of Colon Polyps:sisters  Mother alive at 23 with colon  cancer,CVA Father deceased MI at age 36  Social History: Reviewed history from 05/04/2010 and no changes required. 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 Alcohol use-no Drug use-no Regular exercise-no Illicit Drug Use - no  Review of Systems  The patient denies anorexia, fever, weight loss, weight gain, chest pain, syncope, dyspnea on exertion, peripheral edema, prolonged cough, headaches, hemoptysis, abdominal pain, hematuria, suspicious skin lesions, enlarged lymph nodes, and angioedema.   Resp:  Denies chest discomfort, chest pain with inspiration, cough, coughing up blood, pleuritic, shortness of breath, sputum productive, and wheezing. GU:  Complains of erectile dysfunction; denies decreased libido, discharge, dysuria, genital sores, hematuria, nocturia, urinary frequency, and urinary hesitancy.  Physical Exam  General:  alert, well-developed, well-nourished, and well-hydrated.   Head:  normocephalic, atraumatic, no abnormalities observed, and no abnormalities palpated.   Mouth:  good dentition and pharynx pink and moist.   Neck:  supple, full ROM, no masses, no thyromegaly, no thyroid nodules or tenderness, no JVD, no carotid bruits, no cervical lymphadenopathy, and no neck tenderness.   Lungs:  normal respiratory effort, no intercostal retractions, no accessory muscle use, and normal breath sounds.   Heart:  Normal rate and regular rhythm. S1 and S2 normal without gallop, murmur, click, rub or other extra sounds. Abdomen:  soft, non-tender, normal bowel sounds, no distention, no hepatomegaly, and no splenomegaly.   Msk:  normal ROM, no joint tenderness, no joint swelling, no joint warmth, and no redness over joints.   Pulses:  R femoral decreased, R popliteal decreased, R posterior tibial decreased, R dorsalis pedis decreased, L femoral decreased, L popliteal decreased, L  posterior tibial decreased, and L dorsalis pedis decreased.   Extremities:  No clubbing, cyanosis, edema, or deformity noted with normal full range of motion of all joints.   Neurologic:  No cranial nerve deficits noted. Station and gait are normal. Plantar reflexes are down-going bilaterally. DTRs are symmetrical throughout. Sensory, motor and coordinative functions appear intact. Skin:  turgor normal, color normal, no rashes, no suspicious lesions, no ecchymoses, no petechiae, and no purpura.   Cervical Nodes:  no anterior cervical adenopathy and no posterior cervical adenopathy.   Axillary Nodes:  no R axillary adenopathy and no L axillary adenopathy.   Inguinal Nodes:  no R inguinal adenopathy and no L inguinal adenopathy.   Psych:  Cognition and judgment appear intact. Alert and cooperative with normal attention span and concentration. No apparent delusions, illusions, hallucinations  Diabetes Management Exam:    Foot Exam (with socks and/or shoes not present):       Sensory-Pinprick/Light touch:          Left medial foot (L-4): normal          Left dorsal foot (L-5): normal          Left lateral foot (S-1): normal          Right medial foot (L-4): normal          Right dorsal foot (L-5): normal          Right lateral foot (S-1): normal       Sensory-Monofilament:          Left foot: normal          Right foot: normal       Inspection:          Left foot: normal          Right foot: normal       Nails:          Left foot: normal          Right foot: normal   Impression & Recommendations:  Problem # 1:  TOBACCO ABUSE (ICD-305.1) Assessment Unchanged  Encouraged  smoking cessation and discussed different methods for smoking cessation.   Orders: Tobacco use cessation intermediate 3-10 minutes (13086)  Problem # 2:  HYPERTENSION, BENIGN (ICD-401.1) Assessment: Improved  His updated medication list for this problem includes:    Coreg 12.5 Mg Tabs (Carvedilol) .Marland Kitchen... 1 tab two  times a day  BP today: 118/76 Prior BP: 108/68 (06/10/2010)  Prior 10 Yr Risk Heart Disease: Not enough information (04/15/2009)  Labs Reviewed: K+: 4.3 (05/07/2010) Creat: : 0.8 (05/07/2010)   Chol: 99 (05/07/2010)   HDL: 26.60 (05/07/2010)   LDL: 58 (05/07/2010)   TG: 74.0 (05/07/2010)  Orders: Tobacco use cessation intermediate 3-10 minutes (57846)  Problem # 3:  COPD (ICD-496) Assessment: Unchanged  His updated medication list for this problem includes:    Albuterol Sulfate (2.5 Mg/52ml) 0.083% Nebu (Albuterol sulfate) .Marland Kitchen... 1vial via neb every 4 hrs as needed    Spiriva Handihaler 18 Mcg Caps (Tiotropium bromide monohydrate) .Marland Kitchen... 1 inhalation once daily  Orders: Tobacco use cessation intermediate 3-10 minutes (96295)  Problem # 4:  DIAB W/UNS COMP TYPE II/UNS NOT STATED UNCNTRL (ICD-250.90) Assessment: Improved  Orders: Ophthalmology Referral (Ophthalmology) Tobacco use cessation intermediate 3-10 minutes (28413)  Labs Reviewed: Creat: 0.8 (05/07/2010)    Reviewed HgBA1c results: 6.2 (05/07/2010)  6.3 (12/21/2009)  Problem # 5:  BENIGN PROSTATIC HYPERTROPHY, WITH OBSTRUCTION (ICD-600.01) Assessment: Unchanged  PSA: 0.47 (12/21/2009)     Orders: Tobacco use cessation intermediate 3-10 minutes (99406)  Complete Medication List: 1)  Paxil 30 Mg Tabs (Paroxetine hcl) .... Take 2 tablet by mouth once a day 2)  Trazodone Hcl 100 Mg Tabs (Trazodone hcl) .... Take 3 tab by mouth at bedtime 3)  Coreg 12.5 Mg Tabs (Carvedilol) .Marland Kitchen.. 1 tab two times a day 4)  Xanax 1 Mg Tabs (Alprazolam) .Marland Kitchen.. 1 tab by mouth every 8 hours as needed 5)  Albuterol Sulfate (2.5 Mg/38ml) 0.083% Nebu (Albuterol sulfate) .Marland Kitchen.. 1vial via neb every 4 hrs as needed 6)  Vicodin Hp 10-660 Mg Tabs (Hydrocodone-acetaminophen) .... One by mouth qid as needed for pain 7)  Spiriva Handihaler 18 Mcg Caps (Tiotropium bromide monohydrate) .Marland Kitchen.. 1 inhalation once daily 8)  Crestor 20 Mg Tabs (Rosuvastatin  calcium) .... One by mouth once daily for cholesterol 9)  Element Control Normal Liqd (Blood glucose calibration) .... Check meter with each new vial of test strips or irregular readings 10)  Element Test Strp (Glucose blood) .... Test once daily 11)  Lancets Misc (Lancets) .... Test once daily 12)  Lancing Device Misc (Lancet devices) .... Teast once daily 13)  Element Auto System  .... Test once daily 14)  Nexium 40 Mg Cpdr (Esomeprazole magnesium) .... One by mouth once daily 15)  Astepro 0.15 % Soln (Azelastine hcl) .Marland Kitchen.. 1 to 2 sprays in each nostril up to two times a day as needed for itching and allergy 16)  Viagra 50 Mg Tabs (Sildenafil citrate) .... As needed  Other Orders: Flu Vaccine 36yrs + MEDICARE PATIENTS (K4401) Administration Flu vaccine - MCR (G0008)  Lipid Assessment/Plan:      Based on NCEP/ATP III, the patient's risk factor category is "history of coronary disease, peripheral vascular disease, cerebrovascular disease, or aortic aneurysm along with either diabetes, current smoker, or LDL > 130 plus HDL < 40 plus triglycerides > 200".  The patient's lipid goals are as follows: Total cholesterol goal is 200; LDL cholesterol goal is 70; HDL cholesterol goal is 40; Triglyceride goal is 150.  His LDL cholesterol goal has not  been met.  Secondary causes for hyperlipidemia have been ruled out.  He has been counseled on adjunctive measures for lowering his cholesterol and has been provided with dietary instructions.    Patient Instructions: 1)  Please schedule a follow-up appointment in 3 months. 2)  Tobacco is very bad for your health and your loved ones! You Should stop smoking!. 3)  Stop Smoking Tips: Choose a Quit date. Cut down before the Quit date. decide what you will do as a substitute when you feel the urge to smoke(gum,toothpick,exercise). 4)  It is important that you exercise regularly at least 20 minutes 5 times a week. If you develop chest pain, have severe difficulty  breathing, or feel very tired , stop exercising immediately and seek medical attention. 5)  You need to lose weight. Consider a lower calorie diet and regular exercise.  6)  If you could be exposed to sexually transmitted diseases, you should use a condom. 7)  Check your blood sugars regularly. If your readings are usually above 200 or below 70 you should contact our office. 8)  It is important that your Diabetic A1c level is checked every 3 months. 9)  See your eye doctor yearly to check for diabetic eye damage. 10)  Check your feet each night for sore areas, calluses or signs of infection. 11)  Check your Blood Pressure regularly. If it is above 130/80: you should make an appointment. Prescriptions: CRESTOR 20 MG TABS (ROSUVASTATIN CALCIUM) One by mouth once daily for cholesterol  #30 x 11   Entered and Authorized by:   Etta Grandchild MD   Signed by:   Etta Grandchild MD on 08/05/2010   Method used:   Electronically to        [facility] (retail)       [address]       [place], [place]  [address]       Ph: [phone] or [phone]       Fax: [phone]   RxID:   [id number] COREG 12.5 MG TABS (CARVEDILOL) 1 tab two times a day  #60 x 11   Entered and Authorized by:   Etta Grandchild MD   Signed by:   Etta Grandchild MD on 08/05/2010   Method used:   Electronically to        [facility] (retail)       [address]       [place], [place]  [address]       Ph: [phone] or [phone]       Fax: [phone]   RxID:   [id number]    Orders Added: 1)  Ophthalmology Referral [Ophthalmology] 2)  Flu Vaccine 58yrs + MEDICARE PATIENTS [Q2039] 3)  Administration Flu vaccine - MCR [G0008] 4)  Est. Patient Level III [42706] 5)  Tobacco use cessation intermediate 3-10 minutes [99406] Flu Vaccine Consent Questions     Do you have a history of severe allergic reactions to this vaccine? no    Any prior history of allergic reactions to egg and/or  gelatin? no    Do you have a sensitivity to the preservative Thimersol? no    Do you have a past history of Guillan-Barre Syndrome? no    Do you currently have an acute febrile illness? no    Have you ever had a severe reaction to latex? no    Vaccine information given and explained to patient? yes    Are you currently pregnant?  no    Lot Number:[id number]   Exp Date:03/26/2011   Site Given  Left Deltoid IMine 20yrs + MEDICARE PATIENTS [Q2039] 3)  Administration Flu vaccine - MCR [G0008]      [email or web address]

## 2010-10-26 NOTE — Progress Notes (Signed)
Summary: rx for Symbicort  Phone Note Call from Patient Call back at Home Phone [phone]   Caller: Patient Call For: [name] Reason for Call: Talk to Nurse Summary of Call: was given a sample of symbicort to try.  It works well and pt would like this called in to pharmacy.  [name] gave to pt. Sheliah Plane Initial call taken by: Eugene Gavia,  November 24, 2009 8:13 AM  Follow-up for Phone Call        called and spoke with pt.  pt is MR's pt but saw RB for a sick visit on 11-10-2009.  Pt states he was instructed to stop Zyrtec and Spiriva (which pt did) and start Symbicort 80/4.5.  Pt states breathing has improved.  Pt denied dry mouth, cough, or gasping for air.  Pt requests this rx be called into pharmacy.  Will forward message to MR to address.  Aundra Millet Reynolds LPN  November 25, 979 9:34 AM   Additional Follow-up for Phone Call Additional follow up Details #1::        Reviewed pt's last OV with RB and pt was to start Symbicort 80 Inhale 2 puffs two times a day. Called and informed pt of same. Sent to [facility] in error, called and canceled same, sent in electronic to [facility]. Zackery Barefoot CMA  November 25, 2009 11:06 AM     New/Updated Medications: SYMBICORT 80-4.5 MCG/ACT AERO (BUDESONIDE-FORMOTEROL FUMARATE) Inhale 2 puffs two times a day Prescriptions: SYMBICORT 80-4.5 MCG/ACT AERO (BUDESONIDE-FORMOTEROL FUMARATE) Inhale 2 puffs two times a day  #1 x 1   Entered by:   Zackery Barefoot CMA   Authorized by:   Kalman Shan MD   Signed by:   Zackery Barefoot CMA on 11/25/2009   Method used:   Electronically to        [facility] (retail)       [address]       [place], [place]  [id number]       Ph: [phone] or [phone]       Fax: [phone]   RxID:   [id number] SYMBICORT 80-4.5 MCG/ACT AERO (BUDESONIDE-FORMOTEROL FUMARATE) Inhale 2 puffs two times a day  #1 x 1   Entered by:   Zackery Barefoot CMA   Authorized by:   Kalman Shan MD  Signed by:   Zackery Barefoot CMA on 11/25/2009   Method used:   Electronically to        [facility] (retail)       [address]       [place], [place]  [id number]       Ph: [phone]       Fax: [phone]   RxID:   [id number]

## 2010-10-26 NOTE — Assessment & Plan Note (Signed)
Summary: "personal" reason/#/cd   Vital Signs:  Patient profile:   50 year old male Height:      75 inches Weight:      222 pounds BMI:     27.85 O2 Sat:      95 % on Room air Temp:     97.0 degrees F oral Pulse rate:   82 / minute Pulse rhythm:   regular Resp:     16 per minute BP sitting:   110 / 68  (left arm) Cuff size:   large  Vitals Entered By: Rock Nephew CMA (March 18, 2010 8:30 AM)  Nutrition Counseling: Patient's BMI is greater than 25 and therefore counseled on weight management options.  O2 Flow:  Room air  Primary Care Provider:  Etta Grandchild MD   History of Present Illness: He returns and states that he was visited by a [facility] Dept[facility] worker who told him that due to a sexual exposure that he needed to be screened for STD's but no specific type was mentioned. He has no symptoms.  He feels like his LBP would respond better to a higher dose of Vicodin that is taken less frequently.  Back Pain History:      The patient's back pain started approximately 03/11/2009.  The pain is located in the lower back region and does not radiate below the knees.  He states this is not work related.  On a scale of 1-10, he describes the pain as a 3.  He states that he has had a prior history of back pain.  The patient has not had any recent physical therapy for his back pain.  The following makes the back pain better: rest.  The following makes the back pain worse: bending.    Critical Exclusionary Diagnosis Criteria (CEDC) for Back Pain:      The patient denies a history of previous trauma.  He has no prior history of spinal surgery.  There are no symptoms to suggest infection, cauda equina, or psychosocial factors for back pain.  Cancer risk factors include age >50 yrs with new back pain.    Lipid Management History:      Positive NCEP/ATP III risk factors include male age 5 years old or older, diabetes, HDL cholesterol less than 40, current tobacco user, hypertension, and  peripheral vascular disease.  Negative NCEP/ATP III risk factors include no family history for ischemic heart disease, no ASHD (atherosclerotic heart disease), no prior stroke/TIA, and no history of aortic aneurysm.        The patient states that he knows about the "Therapeutic Lifestyle Change" diet.  His compliance with the TLC diet is poor.  The patient expresses understanding of adjunctive measures for cholesterol lowering.  Adjunctive measures started by the patient include limit alcohol consumpton.  He expresses no side effects from his lipid-lowering medication.  The patient denies any symptoms to suggest myopathy or liver disease.      Current Medications (verified): 1)  Paxil 30 Mg Tabs (Paroxetine Hcl) .... Take 2 Tablet By Mouth Once A Day 2)  Trazodone Hcl 100 Mg Tabs (Trazodone Hcl) .... Take 3 Tab By Mouth At Bedtime 3)  Coreg 12.5 Mg Tabs (Carvedilol) .... Take 1 Tablet By Mouth Once Daily 4)  Xanax 1 Mg Tabs (Alprazolam) .Marland Kitchen.. 1 Tab By Mouth Every 8 Hours As Needed 5)  Ventolin Hfa 108 (90 Base) Mcg/act Aers (Albuterol Sulfate) .... Inhale 2 Puffs Every Four Hours As Needed 6)  Albuterol  Sulfate (2.5 Mg/37ml) 0.083% Nebu (Albuterol Sulfate) .Marland Kitchen.. 1vial Via Neb Every 4 Hrs As Needed 7)  Vicodin 5-500 Mg Tabs (Hydrocodone-Acetaminophen) .Marland Kitchen.. 1 Tab By Mouth Up To Five Times Daily As Needed 8)  Fluconazole 200 Mg Tabs (Fluconazole) .... One By Mouth Once Daily For 30 Days 9)  Doxycycline Hyclate 100 Mg Tabs (Doxycycline Hyclate) .... One By Mouth Two Times A Day For 30 Days 10)  Symbicort 160-4.5 Mcg/act Aero (Budesonide-Formoterol Fumarate) .... Inhale 2 Puffs Two Times A Day 11)  Crestor 20 Mg Tabs (Rosuvastatin Calcium) .... One By Mouth Once Daily For Cholesterol 12)  Aciphex 20 Mg Tbec (Rabeprazole Sodium) .... Once Daily For Acid Reflux 13)  Zyrtec Allergy 10 Mg Tabs (Cetirizine Hcl) .... Take 1 Tablet By Mouth Once A Day 14)  Fluticasone Propionate 50 Mcg/act Susp (Fluticasone  Propionate) .... 2 Squirts Each Nostril Bid  Allergies (verified): 1)  ! Ace Inhibitors 2)  ! Pcn 3)  ! * Blood Thiners 4)  ! 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 Alcohol use-no Drug use-no Regular exercise-no Illicit Drug Use - no  Risk Factors: Alcohol Use: 0 (02/15/2010) Exercise: yes (06/23/2008)  Risk Factors: Smoking Status: current (03/17/2010) Packs/Day: 1.0 - 1.5 (03/17/2010) Cans of tobacco/wk: no (02/15/2010) Passive Smoke Exposure: no  (02/15/2010)  Family History: Reviewed history from 10/05/2009 and no changes required. Family History of Alcoholism/Addiction Family History of Arthritis Family History Diabetes 3/6 sibs Family History High cholesterol Family History Hypertension Family History of Cardiovascular disorder Family History of Colon Cancer:Mother Family History of Colon Polyps:sisters  Mother alive at 35 with colon cancer,CVA Father deceased MI at age 32  Social History: Reviewed history from 10/23/2009 and no changes required. 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 Alcohol use-no Drug use-no Regular exercise-no Illicit Drug Use - no  Review of Systems       The patient complains of weight gain.  The patient denies anorexia, fever, weight loss, chest pain, syncope, dyspnea on exertion, peripheral edema, prolonged cough, headaches, hemoptysis, abdominal pain, genital sores, suspicious skin lesions, and enlarged lymph nodes.   General:  Denies chills, fatigue, fever, loss of appetite, malaise, and sweats. GU:  Denies dysuria, genital sores, hematuria, nocturia, urinary frequency, and urinary hesitancy.  Physical Exam  General:  alert, well-developed, well-nourished, and well-hydrated.   Head:  normocephalic, atraumatic, no abnormalities observed, and no abnormalities palpated.  Mouth:  good dentition and pharynx pink and moist.   Neck:  supple, full ROM, no masses, no thyromegaly, no thyroid nodules or tenderness, no JVD, no carotid bruits, no cervical lymphadenopathy, and no neck tenderness.   Lungs:  normal respiratory effort, no intercostal retractions, no accessory muscle use, and normal breath sounds.   Heart:  Normal rate and regular rhythm. S1 and S2 normal without gallop, murmur, click, rub or other extra sounds. Abdomen:  soft, non-tender, normal bowel sounds, no distention, no hepatomegaly, and no splenomegaly.   Genitalia:  circumcised,  no hydrocele, no varicocele, no scrotal masses, no testicular masses or atrophy, no cutaneous lesions, and no urethral discharge.   Msk:  normal ROM, no joint tenderness, no joint swelling, no joint warmth, and no redness over joints.   Pulses:  R femoral decreased, R popliteal decreased, R posterior tibial decreased, R dorsalis pedis decreased, L femoral decreased, L popliteal decreased, L posterior tibial decreased, and L dorsalis pedis decreased.   Extremities:  No clubbing, cyanosis, edema, or deformity noted with normal full range of motion of all joints.   Neurologic:  No cranial nerve deficits noted. Station and gait are normal. Plantar reflexes are down-going bilaterally. DTRs are symmetrical throughout. Sensory, motor and coordinative functions appear intact. Skin:  turgor normal, color normal, no rashes, no suspicious lesions, no ecchymoses, no petechiae, and no purpura.   Cervical Nodes:  no anterior cervical adenopathy and no posterior cervical adenopathy.   Axillary Nodes:  no R axillary adenopathy and no L axillary adenopathy.   Inguinal Nodes:  no R inguinal adenopathy and no L inguinal adenopathy.   Psych:  Cognition and judgment appear intact. Alert and cooperative with normal attention span and concentration. No apparent delusions, illusions, hallucinations  Low Back Pain Physical Exam:    Motor Exam/Strength:         Left Ankle Dorsiflexion (L5,L4):     normal       Left Great Toe Dorsiflexion (L5,L4):     normal       Left Heel Walk (L5,some L4):     normal       Left Single Squat & Rise-Quads (L4):   normal       Left Toe Walk-calf (S1):       normal       Right Ankle Dorsiflexion (L5,L4):     normal       Right Great Toe Dorsiflexion (L5,L4):       normal       Right Heel Walk (L5,some L4):     normal       Right Single Squat & Rise Quads (L4):   normal       Right Toe Walk-calf (S1):       normal    Sensory Exam/Pinprick:        Left Medial Foot (L4):   normal        Left Dorsal Foot (L5):   normal       Left Lateral Foot (S1):   normal       Right Medial Foot (L4):   normal       Right Dorsal Foot (L5):   normal       Right Lateral Foot (S1):   normal    Reflexes:        Left Knee Jerk (L4):     normal       Left Ankle Reflex (S1):   normal  Right Knee Jerk:     normal       Right Ankle Reflex (S1):   normal    Straight Leg Raise (SLR):       Left Straight Leg Raise (SLR):   negative       Right Straight Leg Raise (SLR):   negative  Diabetes Management Exam:    Foot Exam (with socks and/or shoes not present):       Sensory-Pinprick/Light touch:          Left medial foot (L-4): normal          Left dorsal foot (L-5): normal          Left lateral foot (S-1): normal          Right medial foot (L-4): normal          Right dorsal foot (L-5): normal          Right lateral foot (S-1): normal       Sensory-Monofilament:          Left foot: normal          Right foot: normal       Inspection:          Left foot: normal          Right foot: normal       Nails:          Left foot: normal          Right foot: normal   Impression & Recommendations:  Problem # 1:  HYPERTENSION, BENIGN (ICD-401.1) Assessment Improved  His updated medication list for this problem includes:    Coreg 12.5 Mg Tabs (Carvedilol) .Marland Kitchen... Take 1 tablet by mouth once daily  BP today: 110/68 Prior BP: 112/60 (03/17/2010)  Prior 10 Yr Risk Heart Disease: Not enough information (04/15/2009)  Labs Reviewed: K+: 4.3 (12/21/2009) Creat: : 0.9 (12/21/2009)   Chol: 236 (06/23/2008)   HDL: 30.9 (06/23/2008)   LDL: DEL (06/23/2008)   TG: 135 (06/23/2008)  Problem # 2:  SEXUAL ACTIVITY, HIGH RISK (ICD-V69.2) Assessment: Unchanged  Problem # 3:  LOW BACK PAIN (ICD-724.2) Assessment: Unchanged  His updated medication list for this problem includes:    Vicodin Hp 10-660 Mg Tabs (Hydrocodone-acetaminophen) ..... One by mouth three times a day as needed for  pain  Discussed use of moist heat or ice, modified activities, medications, and stretching/strengthening exercises. Back care instructions given. To be seen in 2 weeks if no improvement; sooner if worsening of symptoms.   Complete Medication List: 1)  Paxil 30 Mg Tabs (Paroxetine hcl) .... Take 2 tablet by mouth once a day 2)  Trazodone Hcl 100 Mg Tabs (Trazodone hcl) .... Take 3 tab by mouth at bedtime 3)  Coreg 12.5 Mg Tabs (Carvedilol) .... Take 1 tablet by mouth once daily 4)  Xanax 1 Mg Tabs (Alprazolam) .Marland Kitchen.. 1 tab by mouth every 8 hours as needed 5)  Ventolin Hfa 108 (90 Base) Mcg/act Aers (Albuterol sulfate) .... Inhale 2 puffs every four hours as needed 6)  Albuterol Sulfate (2.5 Mg/17ml) 0.083% Nebu (Albuterol sulfate) .Marland Kitchen.. 1vial via neb every 4 hrs as needed 7)  Vicodin Hp 10-660 Mg Tabs (Hydrocodone-acetaminophen) .... One by mouth three times a day as needed for pain 8)  Fluconazole 200 Mg Tabs (Fluconazole) .... One by mouth once daily for 30 days 9)  Doxycycline Hyclate 100 Mg Tabs (Doxycycline hyclate) .... One by mouth two times a day for 30 days 10)  Symbicort  160-4.5 Mcg/act Aero (Budesonide-formoterol fumarate) .... Inhale 2 puffs two times a day 11)  Crestor 20 Mg Tabs (Rosuvastatin calcium) .... One by mouth once daily for cholesterol 12)  Aciphex 20 Mg Tbec (Rabeprazole sodium) .... Once daily for acid reflux 13)  Zyrtec Allergy 10 Mg Tabs (Cetirizine hcl) .... Take 1 tablet by mouth once a day 14)  Fluticasone Propionate 50 Mcg/act Susp (Fluticasone propionate) .... 2 squirts each nostril bid  Lipid Assessment/Plan:      Based on NCEP/ATP III, the patient's risk factor category is "history of coronary disease, peripheral vascular disease, cerebrovascular disease, or aortic aneurysm along with either diabetes, current smoker, or LDL > 130 plus HDL < 40 plus triglycerides > 200".  The patient's lipid goals are as follows: Total cholesterol goal is 200; LDL cholesterol goal is  70; HDL cholesterol goal is 40; Triglyceride goal is 150.  His LDL cholesterol goal has not been met.  Secondary causes for hyperlipidemia have been ruled out.  He has been counseled on adjunctive measures for lowering his cholesterol and has been provided with dietary instructions.    Patient Instructions: 1)  Please schedule a follow-up appointment in 3 months. 2)  If you could be exposed to sexually transmitted diseases, you should use a condom. 3)  Most patients (90%) with low back pain will improve with time (2-6 weeks). Keep active but avoid activities that are painful. Apply moist heat and/or ice to lower back several times a day. Prescriptions: VICODIN HP 10-660 MG TABS (HYDROCODONE-ACETAMINOPHEN) One by mouth three times a day as needed for pain  #90 x 3   Entered and Authorized by:   Etta Grandchild MD   Signed by:   Etta Grandchild MD on 03/18/2010   Method used:   Print then Give to Patient   RxID:   [id number]

## 2010-10-26 NOTE — Progress Notes (Signed)
Summary: results  Phone Note Call from Patient Call back at [phone]   Caller: Patient Call For: young Reason for Call: Talk to Nurse, Lab or Test Results Summary of Call: pt looking for results of labs. Initial call taken by: Eugene Gavia,  May 13, 2010 8:27 AM  Follow-up for Phone Call        Spoke to patient; he is wanting the lab results of his HIV-for some reason this test was grouped in with the allergy profile CDY requested. Told patient I would send this note to Dr. Yetta Barre to have him look at the lab and call with results.Reynaldo Minium CMA  May 13, 2010 8:50 AM   Additional Follow-up for Phone Call Additional follow up Details #1::        I informed him that his HIV test is negative Additional Follow-up by: Etta Grandchild MD,  May 13, 2010 10:23 AM

## 2010-10-26 NOTE — Assessment & Plan Note (Signed)
Summary: CONFUSED ABOUT INSULIN/NWS  #   Vital Signs:  Patient profile:   50 year old male Height:      72 inches Weight:      215 pounds BMI:     29.26 O2 Sat:      94 % on Room air Temp:     98.0 degrees F oral Pulse rate:   88 / minute Pulse rhythm:   regular Resp:     16 per minute BP sitting:   136 / 70  (left arm) Cuff size:   large  Vitals Entered By: Rock Nephew CMA (October 14, 2009 9:42 AM)  Nutrition Counseling: Patient's BMI is greater than 25 and therefore counseled on weight management options.  O2 Flow:  Room air  Primary Care Provider:  Etta Grandchild MD   History of Present Illness: He returns for f/up and says his BS has been 84-120 and he doesn't need insulin anymore. He has worked on his diet and has lost about 25 pounds.  Hypertension History:      He complains of side effects from treatment, but denies headache, chest pain, palpitations, dyspnea with exertion, peripheral edema, visual symptoms, neurologic problems, and syncope.  He notes the following problems with antihypertensive medication side effects: cough.        Positive major cardiovascular risk factors include male age 7 years old or older, diabetes, hyperlipidemia, hypertension, and current tobacco user.  Negative major cardiovascular risk factors include negative family history for ischemic heart disease.        Positive history for target organ damage include cardiac end organ damage (either CHF or LVH) and peripheral vascular disease.  Further assessment for target organ damage reveals no history of ASHD, stroke/TIA, renal insufficiency, or hypertensive retinopathy.     Preventive Screening-Counseling & Management  Alcohol-Tobacco     Alcohol drinks/day: 0     Smoking Status: current     Smoking Cessation Counseling: yes     Smoke Cessation Stage: precontemplative     Packs/Day: 1.0  Hep-HIV-STD-Contraception     Hepatitis Risk: risk noted     Hepatitis Risk Counseling: to avoid  increased hepatitis risk     HIV Risk: risk noted     HIV Risk Counseling: to avoid increased HIV risk     STD Risk: risk noted     STD Risk Counseling: to avoid increased STD risk      Sexual History:  multiple partners in the past.        Drug Use:  former.        Blood Transfusions:  no.    Clinical Review Panels:  Lipid Management   Cholesterol:  236 (06/23/2008)   LDL (bad choesterol):  DEL (06/23/2008)   HDL (good cholesterol):  30.9 (06/23/2008)  Diabetes Management   HgBA1C:  6.2 (08/14/2009)   Creatinine:  0.9 (08/14/2009)   Last Foot Exam:  yes (10/14/2009)   Last Flu Vaccine:  Fluvax 3+ (06/23/2009)   Last Pneumovax:  Pneumovax (12/06/2007)  CBC   WBC:  7.2 (08/14/2009)   RBC:  4.69 (08/14/2009)   Hgb:  14.7 (08/14/2009)   Hct:  43.2 (08/14/2009)   Platelets:  134.0 (08/14/2009)   MCV  92.0 (08/14/2009)   MCHC  33.9 (08/14/2009)   RDW  14.0 (08/14/2009)   PMN:  53.1 (08/14/2009)   Lymphs:  37.4 (08/14/2009)   Monos:  7.6 (08/14/2009)   Eosinophils:  1.6 (08/14/2009)   Basophil:  0.3 (08/14/2009)  Complete  Metabolic Panel   Glucose:  112 (08/14/2009)   Sodium:  141 (08/14/2009)   Potassium:  4.1 (08/14/2009)   Chloride:  107 (08/14/2009)   CO2:  29 (08/14/2009)   BUN:  7 (08/14/2009)   Creatinine:  0.9 (08/14/2009)   Albumin:  3.8 (08/14/2009)   Total Protein:  6.7 (08/14/2009)   Calcium:  9.0 (08/14/2009)   Total Bili:  0.7 (08/14/2009)   Alk Phos:  76 (08/14/2009)   SGPT (ALT):  19 (08/14/2009)   SGOT (AST):  10 (08/14/2009)   Current Medications (verified): 1)  Coreg 12.5 Mg Tabs (Carvedilol) .... Take 1 Tablet By Mouth Two Times A Day 2)  Vicodin 5-500 Mg Tabs (Hydrocodone-Acetaminophen) .... Four Times A Day As Needed 3)  Ventolin Hfa 108 (90 Base) Mcg/act Aers (Albuterol Sulfate) .... 2 Puff Up To Qid 4)  Clozapine 100 Mg Tabs (Clozapine) .... 1/2 Am and 100mg  At Bedtime 5)  Xanax 1 Mg Tabs (Alprazolam) .... Take 1 Tablet By Mouth Three  Times A Day 6)  Paxil 30 Mg Tabs (Paroxetine Hcl) .... Two Tablets By Mouth Once Daily 7)  Onetouch Ultra Test  Strp (Glucose Blood) .... Qid, and Lancets 250.01.  Variable Glucoses 8)  Vytorin 10-40 Mg Tabs (Ezetimibe-Simvastatin) .... Once Daily 9)  Protonix 40 Mg Tbec (Pantoprazole Sodium) .... Take 1 Tablet By Mouth Twice A Day ( Before Breakfast and Supper) 10)  Spiriva Handihaler 18 Mcg Caps (Tiotropium Bromide Monohydrate) .... One Puff Once Daily 11)  Trazodone Hcl 100 Mg Tabs (Trazodone Hcl) .... One By Mouth Qhs 12)  Aspirin 81 Mg Tbec (Aspirin) .... Take One Tablet By Mouth Daily  Allergies (verified): 1)  ! Ace Inhibitors  Past History:  Past Medical History: Reviewed history from 10/02/2009 and no changes required. 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 Allyson Sabal)  Social History: Sexual History:  multiple partners in the past  Review of Systems       The patient complains of prolonged cough.   Resp:  Complains of cough; denies chest discomfort, chest pain with inspiration, coughing up blood, morning headaches, pleuritic, shortness of  breath, sputum productive, and wheezing.  Physical Exam  General:  alert, well-developed, well-nourished, and well-hydrated.   Mouth:  Oral mucosa and oropharynx without lesions or exudates.  Teeth in good repair. Neck:  supple, full ROM, no masses, no thyromegaly, and no thyroid nodules or tenderness.   Lungs:  normal respiratory effort, no intercostal retractions, no accessory muscle use, and normal breath sounds.   Heart:  Normal rate and regular rhythm. S1 and S2 normal without gallop, murmur, click, rub or other extra sounds. Abdomen:  soft, non-tender, normal bowel sounds, no distention, no hepatomegaly, and no splenomegaly.   Msk:  normal ROM, no joint tenderness, no joint swelling, no joint warmth, and no redness over joints.   Pulses:  R femoral decreased, R popliteal  decreased, R posterior tibial decreased, R dorsalis pedis decreased, L femoral decreased, L popliteal decreased, L posterior tibial decreased, and L dorsalis pedis decreased.   Extremities:  No clubbing, cyanosis, edema, or deformity noted with normal full range of motion of all joints.   Neurologic:  No cranial nerve deficits noted. Station and gait are normal. Plantar reflexes are down-going bilaterally. DTRs are symmetrical throughout. Sensory, motor and coordinative functions appear intact. Skin:  turgor normal, color normal, no rashes, no suspicious lesions, no ecchymoses, no petechiae, and no purpura.   Cervical Nodes:  no anterior cervical adenopathy and no posterior cervical adenopathy.   Psych:  Cognition and judgment appear intact. Alert and cooperative with normal attention span and concentration. No apparent delusions, illusions, hallucinations  Diabetes Management Exam:    Foot Exam (with socks and/or shoes not present):       Sensory-Pinprick/Light touch:          Left medial foot (L-4): normal          Left dorsal foot (L-5): normal          Left lateral foot (S-1): normal          Right medial foot  (L-4): normal          Right dorsal foot (L-5): normal          Right lateral foot (S-1): normal       Sensory-Monofilament:          Left foot: normal          Right foot: normal       Inspection:          Left foot: abnormal             Comments: calluses          Right foot: normal       Nails:          Left foot: normal          Right foot: normal   Impression & Recommendations:  Problem # 1:  CALLUSES, LEFT FOOT (ICD-700) Assessment New  Orders: Podiatry Referral (Podiatry)  Problem # 2:  HYPERTENSION, BENIGN (ICD-401.1) Assessment: Improved  The following medications were removed from the medication list:    Lisinopril 10 Mg Tabs (Lisinopril) .Marland Kitchen... 1 tab once daily His updated medication list for this problem includes:    Coreg 12.5 Mg Tabs (Carvedilol) .Marland Kitchen... Take 1 tablet by mouth two times a day  Problem # 3:  COUGH (ICD-786.2) Assessment: Deteriorated  he needs to stay off of ACEI's and to quit smoking  Orders: Tobacco use cessation intermediate 3-10 minutes (99406)  Problem # 4:  DIAB W/UNS COMP TYPE II/UNS NOT STATED UNCNTRL (ICD-250.90) Assessment: Unchanged  The following medications were removed from the medication list:    Novolog Flexpen 100 Unit/ml Soln (Insulin aspart) .Marland KitchenMarland KitchenMarland KitchenMarland Kitchen 6unit before breakfast, 8unts before lunch, 6units before supper    Lisinopril 10 Mg Tabs (Lisinopril) .Marland Kitchen... 1 tab once daily His updated medication list for this problem includes:    Aspirin 81 Mg Tbec (Aspirin) .Marland Kitchen... Take one tablet by mouth daily  Labs Reviewed: Creat: 0.9 (08/14/2009)    Reviewed HgBA1c results: 6.2 (08/14/2009)  6.6 (05/08/2009)  Problem # 5:  TOBACCO USE (ICD-305.1) Assessment: Unchanged  Encouraged smoking cessation and discussed different methods for smoking cessation.   Orders: Tobacco use cessation intermediate 3-10 minutes (99406)  Complete Medication List: 1)  Coreg 12.5 Mg Tabs (Carvedilol) .... Take 1 tablet by  mouth two times a day 2)   Vicodin 5-500 Mg Tabs (Hydrocodone-acetaminophen) .... Four times a day as needed 3)  Ventolin Hfa 108 (90 Base) Mcg/act Aers (Albuterol sulfate) .... 2 puff up to qid 4)  Clozapine 100 Mg Tabs (Clozapine) .... 1/2 am and 100mg  at bedtime 5)  Xanax 1 Mg Tabs (Alprazolam) .... Take 1 tablet by mouth three times a day 6)  Paxil 30 Mg Tabs (Paroxetine hcl) .... Two tablets by mouth once daily 7)  Onetouch Ultra Test Strp (Glucose blood) .... Qid, and lancets 250.01.  variable glucoses 8)  Vytorin 10-40 Mg Tabs (Ezetimibe-simvastatin) .... Once daily 9)  Protonix 40 Mg Tbec (Pantoprazole sodium) .... Take 1 tablet by mouth twice a day ( before breakfast and supper) 10)  Spiriva Handihaler 18 Mcg Caps (Tiotropium bromide monohydrate) .... One puff once daily 11)  Trazodone Hcl 100 Mg Tabs (Trazodone hcl) .... One by mouth qhs 12)  Aspirin 81 Mg Tbec (Aspirin) .... Take one tablet by mouth daily  Hypertension Assessment/Plan:      The patient's hypertensive risk group is category C: Target organ damage and/or diabetes.  Today's blood pressure is 136/70.  His blood pressure goal is < 130/80.  Patient Instructions: 1)  Please schedule a follow-up appointment in 3 months. 2)  Tobacco is very bad for your health and your loved ones! You Should stop smoking!. 3)  Stop Smoking Tips: Choose a Quit date. Cut down before the Quit date. decide what you will do as a substitute when you feel the urge to smoke(gum,toothpick,exercise). 4)  It is important that you exercise regularly at least 20 minutes 5 times a week. If you develop chest pain, have severe difficulty breathing, or feel very tired , stop exercising immediately and seek medical attention. 5)  You need to lose weight. Consider a lower calorie diet and regular exercise.  6)  If you could be exposed to sexually transmitted diseases, you should use a condom. 7)  Check your blood sugars regularly. If your readings are usually above  200  or below  70 you should contact our office. 8)  It is important that your Diabetic A1c level is checked every 3 months. 9)  See your eye doctor yearly to check for diabetic eye damage. 10)  Check your feet each night for sore areas, calluses or signs of infection. 11)  Check your Blood Pressure regularly. If it is above 130/80: you should make an appointment.

## 2010-10-26 NOTE — Progress Notes (Signed)
Summary: Lab request  Phone Note Call from Patient Call back at Home Phone [phone]   Caller: Patient Summary of Call: Patient called stating that he has appt with pulmonary today and would like to stop by the lab and have a STD panel done. He states that a Software engineer stopped by his house and told him that he may have been possibly been exposed to an STD. If he stops by the lab today then he will not need to keep appt tomarrow. Please advise thanks Initial call taken by: Rock Nephew CMA,  March 17, 2010 8:34 AM  Follow-up for Phone Call        done Follow-up by: Etta Grandchild MD,  March 17, 2010 8:55 AM  Additional Follow-up for Phone Call Additional follow up Details #1::        Orders placed in [facility].Marland KitchenMarland KitchenAlvy Beal Archie CMA  March 17, 2010 9:02 AM   Patient notified. Lucious Groves  March 17, 2010 9:05 AM

## 2010-10-26 NOTE — Progress Notes (Signed)
Summary: lab request  Phone Note Call from Patient   Caller: Patient Summary of Call: Patient stopped in today stating that he is scheduled for a follow up on 05/07/10. Since pt will have his mother with him, he is requesting to come in prior to appointment 69mo labs to include cholesterol and STD panel. Patient is also request to have a cxr, when asked for what reason, he states becuase he has not had one in a long time and has COPD.Marland KitchenMarland KitchenAlvy Beal Archie CMA  May 04, 2010 10:16 AM   Follow-up for Phone Call        i can order all of this when I see him, thanks Follow-up by: Etta Grandchild MD,  May 04, 2010 11:04 AM  Additional Follow-up for Phone Call Additional follow up Details #1::        Patient notified .Marland KitchenMarland KitchenAlvy Beal Archie CMA  May 04, 2010 3:33 PM

## 2010-10-26 NOTE — Assessment & Plan Note (Signed)
Summary: rov 4 weeks/apc   Visit Type:  Follow-up Copy to:  DR, Yetta Barre Primary Provider/Referring Provider:  Etta Grandchild MD  CC:  Pt here for 4 wk follow up.  states breathing and cough have improved.  states he no longer wakes up at night "gasping for air."  Would like to discuss symbicort dosage.Marland Kitchen  History of Present Illness: OV: 12/14/2009: Followup Gold stage 2 COPD (fev1 77% , RV 157%, DLCO 49% feb 2011, CT 2009 with emphysema, low dlco, heavy smoker), chronic cough, paroxysmal nocutrnal dysonea, tobacco abuse. Since seeing Dr. Delton Coombes over 1 months ago he report cough, dyspnea, nocturnal/early morning gasping for air are all significantly better. He is unable to quiantify furhter but it appears that these symptoms are just mild now and no longer disabling. Attributes this to symbicort. Wants to go to higher dose of symbicort. HE is off spiriva due to concern over dry moouth but this has not helped resolve issue. Still smoking. Still on chantix. STates he is working on quitting smoking. NOTE: reviewed old chart and PFTs  Current Medications (verified): 1)  Abilify 10 Mg Tabs (Aripiprazole) .... Take 1 Tab By Mouth At Bedtime 2)  Paxil 30 Mg Tabs (Paroxetine Hcl) .... Take 2 Tablet By Mouth Once A Day 3)  Trazodone Hcl 100 Mg Tabs (Trazodone Hcl) .... Take 3 Tab By Mouth At Bedtime 4)  Coreg 12.5 Mg Tabs (Carvedilol) .... Take 1 Tablet By Mouth Two Times A Day 5)  Vytorin 10-40 Mg Tabs (Ezetimibe-Simvastatin) .... Take 1 Tab By Mouth At Bedtime 6)  Protonix 40 Mg Tbec (Pantoprazole Sodium) .... Take 1 Tablet By Mouth Twice A Day ( Before Breakfast and Supper) 7)  Pepcid Ac 10 Mg Tabs (Famotidine) .... Take 1 Tab By Mouth At Bedtime 8)  Xanax 1 Mg Tabs (Alprazolam) .Marland Kitchen.. 1 Tab By Mouth Every 8 Hours As Needed 9)  Ventolin Hfa 108 (90 Base) Mcg/act Aers (Albuterol Sulfate) .... Inhale 2 Puffs Every Four Hours As Needed 10)  Albuterol Sulfate (2.5 Mg/13ml) 0.083% Nebu (Albuterol Sulfate)  .Marland Kitchen.. 1vial Via Neb Every 4 Hrs As Needed 11)  Vicodin 5-500 Mg Tabs (Hydrocodone-Acetaminophen) .Marland Kitchen.. 1 Tab By Mouth Every 4-6 Hours As Needed 12)  Symbicort 80-4.5 Mcg/act Aero (Budesonide-Formoterol Fumarate) .... Inhale 2 Puffs Two Times A Day  Allergies (verified): 1)  ! Ace Inhibitors 2)  ! Pcn 3)  ! * Blood Thiners 4)  ! Plavix (Clopidogrel Bisulfate)  Past History:  Past Medical History: Reviewed history from 10/02/2009 and no changes required. 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 Allyson Sabal)  Family History: Reviewed history from 10/05/2009 and no changes required. Family History of Alcoholism/Addiction Family History of Arthritis Family History Diabetes 3/6 sibs Family History High cholesterol Family History Hypertension Family History of Cardiovascular disorder Family History of Colon Cancer:Mother Family History of Colon Polyps:sisters  Mother alive at 31 with  colon cancer,CVA Father deceased MI at age 63  Social History: Reviewed history from 10/23/2009 and no changes required. Disabled due to paranoid/schizophrenia Single Current Smoker  1ppd, started smoking 1974. Alcohol use-no Drug use-no Regular exercise-no Illicit Drug Use - no  Review of Systems       The patient complains of shortness of breath with activity, shortness of breath at rest, weight change, sore throat, nasal congestion/difficulty breathing through nose, anxiety, and depression.  The patient denies productive cough, non-productive cough, coughing up blood, chest pain, irregular heartbeats, acid heartburn, indigestion, loss of appetite, abdominal pain, difficulty swallowing, tooth/dental problems, headaches, sneezing, itching, ear ache, hand/feet swelling, joint stiffness or pain, rash, change in color of mucus, and fever.    Vital Signs:  Patient profile:   50 year old male Height:      72 inches Weight:      225.50 pounds BMI:     30.69 O2 Sat:      97 % on Room air Temp:     97.5 degrees F oral Pulse rate:   78 / minute BP sitting:   128 / 68  (left arm) Cuff size:   regular  Vitals Entered By: Gweneth Dimitri RN (December 14, 2009 9:30 AM)  O2 Flow:  Room air CC: Pt here for 4 wk follow up.  states breathing and cough have improved.  states he no longer wakes up at night "gasping for air."  Would like to discuss symbicort dosage. Comments Medications reviewed with patient Daytime contact number verified with patient. Gweneth Dimitri RN  December 14, 2009 9:29 AM    Physical Exam  General:  alert, well-developed, well-nourished, and well-hydrated.   Head:  normocephalic and atraumatic Eyes:  PERRLA/EOM intact; conjunctiva and sclera clear Ears:  R ear normal and L ear normal.   Nose:  no deformity, discharge, inflammation, or lesions Mouth:  very mild thrhsh on uvula.  no ulcers Neck:  scar of old tracheostomy + Chest Wall:  No deformities, masses, tenderness or gynecomastia noted. Lungs:  normal respiratory effort, no intercostal retractions, no accessory muscle use, and normal breath sounds.   Heart:  Normal rate and regular rhythm. S1 and S2  normal without gallop, murmur, click, rub or other extra sounds. Abdomen:  not examined Msk:  normal ROM, no joint tenderness, no joint swelling, no joint warmth, and no redness over joints.   Pulses:  R femoral decreased, R popliteal decreased, R posterior tibial decreased, R dorsalis pedis decreased, L femoral decreased, L popliteal decreased, L posterior tibial decreased, and L dorsalis pedis decreased.   Extremities:  no clubbing, cyanosis, edema, or deformity noted Neurologic:  non-focal Skin:  turgor normal, color normal, no rashes, no suspicious lesions, no ecchymoses, no petechiae, and no purpura.   Cervical Nodes:  no anterior cervical adenopathy and no posterior cervical adenopathy.   Axillary Nodes:  no R axillary adenopathy and no L axillary adenopathy.   Psych:  anxious, a bit tangential but easily redirected.    MISC. Report  Procedure date:  11/02/2009  Findings:       (fev1 77% , RV 157%, DLCO 49% feb 2011  Comments:      independently reviewed  Impression & Recommendations:  Problem # 1:  THRUSH (ICD-112.0) Assessment New  vermy mild thrush  plan nystatin swish and swallow advised to use listerine after every inhaler use monitor dry mouth with this if still persists, ? rheum consult  Orders: Est. Patient Level IV (16109) HFA  Instruction [phone])  Problem # 2:  COUGH (ICD-786.2) Assessment: Unchanged  Singificnatly better after stating symbicort. ? Due to copd versus asthma component  plan increase symbicort at his strong request monitor repeat spiro at followup inhaler technique re-addressed  Orders: Est. Patient Level IV (60454) HFA Instruction [phone])  Problem # 3:  COPD (ICD-496) Assessment: Improved He has Gold stage 2 copd w+/- asthma componenet  plan increase symbicort offered pulmonary rehab - he refused for the time being at followup if sore mouth not better, will add spiriva quit smoking  Problem # 4:  TOBACCO ABUSE  (ICD-305.1) Assessment: Unchanged  advised to quit conitnue chantix The following medications were removed from the medication list:    Chantix Continuing Month Pak 1 Mg Tabs (Varenicline tartrate) ..... One by mouth bid  Orders: Est. Patient Level IV (91478)  Medications Added to Medication List This Visit: 1)  Trazodone Hcl 100 Mg Tabs (Trazodone hcl) .... Take 3 tab by mouth at bedtime 2)  Symbicort 160-4.5 Mcg/act Aero (Budesonide-formoterol fumarate) .... Two puffs twice daily 3)  Nystatin 100000 Unit/ml Susp (Nystatin) .... Take one teaspoonful by mouth swish & swallow four times a day x 5 days  Patient Instructions: 1)  i am glad you are feeling better 2)  but you have some thrush (very mild) in your mouth 3)  use nystatin swish and swallow 5 times daily x 5 days 4)  rinse your mouth with listerine always after using inhalers 5)  stop lowre dose symbicort 6)  take higher dose symbicort 2 puff two times a day  7)  show inhaler technique to my nurse 8)  use netti pott 3-4 times per week 9)  try to quit smoking 10)  return to see me in 2 months Prescriptions: NYSTATIN 100000 UNIT/ML  SUSP (NYSTATIN) Take one teaspoonful by mouth swish & swallow four times a day x 5 days  #11ml x 0   Entered and Authorized by:   Kalman Shan MD   Signed by:   Kalman Shan MD on 12/14/2009   Method used:   Electronically to        [facility] (retail)       [address]       [place], [place]  [id number]       Ph: [phone]       Fax: [phone]   RxID:   [id number] SYMBICORT 160-4.5 MCG/ACT  AERO (BUDESONIDE-FORMOTEROL FUMARATE) Two puffs twice daily  #1 x 6   Entered and Authorized by:   Kalman Shan MD   Signed by:   Kalman Shan MD on 12/14/2009   Method used:   Electronically to        [facility] (retail)       [address]       [place], [place]  [id number]       Ph: [phone]       Fax:  [phone]   RxID:   [id number]

## 2010-10-26 NOTE — Assessment & Plan Note (Signed)
Summary: 3 MO ROV /NWS   Vital Signs:  Patient profile:   50 year old [name] Height:      73 inches Weight:      218 pounds BMI:     28.87 O2 Sat:      97 % on Room air Temp:     97.2 degrees F oral Pulse rate:   88 / minute Pulse rhythm:   regular Resp:     16 per minute BP sitting:   110 / 70  (left arm) Cuff size:   large  Vitals Entered By: Rock Nephew CMA (Feb 15, 2010 9:19 AM)  Nutrition Counseling: Patient's BMI is greater than 25 and therefore counseled on weight management options.  O2 Flow:  Room air CC: follow-up visit   Primary Care Provider:  Etta Grandchild MD  CC:  follow-up visit.  History of Present Illness:  Follow-Up Visit      This is a 50 year old man who presents for Follow-up visit.  The patient denies chest pain, palpitations, dizziness, syncope, low blood sugar symptoms, high blood sugar symptoms, edema, SOB, DOE, PND, and orthopnea.  Since the last visit the patient notes no new problems or concerns.  The patient reports taking meds as prescribed, monitoring BP, monitoring blood sugars, and dietary compliance.  When questioned about possible medication side effects, the patient notes none.    Preventive Screening-Counseling & Management  Alcohol-Tobacco     Alcohol drinks/day: 0     Smoking Status: current     Smoking Cessation Counseling: yes     Smoke Cessation Stage: ready     Packs/Day: 1.5     Cans of tobacco/week: no     Passive Smoke Exposure: no     Tobacco Counseling: to quit use of tobacco products  Hep-HIV-STD-Contraception     Hepatitis Risk: risk noted     Hepatitis Risk Counseling: to avoid increased hepatitis risk     HIV Risk: risk noted     HIV Risk Counseling: to avoid increased HIV risk     STD Risk: risk noted     STD Risk Counseling: to avoid increased STD risk      Sexual History:  multiple partners currently.        Drug Use:  never.        Blood Transfusions:  no.    Clinical Review Panels:  Prevention  Last Colonoscopy:  1) Two polyps removed, maximum size 1 cm. TUBULAR ADENOMAS 2) Otherwise normal examination 3) 9 adenomas removed in 11/2007 (01/22/2009)   Last PSA:  0.47 (12/21/2009)  Immunizations   Last Tetanus Booster:  Td (04/09/2003)   Last Flu Vaccine:  Fluvax 3+ (06/23/2009)   Last Pneumovax:  Pneumovax (12/06/2007)  Lipid Management   Cholesterol:  236 (06/23/2008)   LDL (bad choesterol):  DEL (06/23/2008)   HDL (good cholesterol):  30.9 (06/23/2008)  Diabetes Management   HgBA1C:  6.3 (12/21/2009)   Creatinine:  0.9 (12/21/2009)   Last Foot Exam:  yes (02/15/2010)   Last Flu Vaccine:  Fluvax 3+ (06/23/2009)   Last Pneumovax:  Pneumovax (12/06/2007)  CBC   WBC:  8.9 (12/21/2009)   RBC:  5.35 (12/21/2009)   Hgb:  16.5 (12/21/2009)   Hct:  49.8 (12/21/2009)   Platelets:  122.0 (12/21/2009)   MCV  93.0 (12/21/2009)   MCHC  33.1 (12/21/2009)   RDW  14.1 (12/21/2009)   PMN:  62.9 (12/21/2009)   Lymphs:  27.4 (12/21/2009)   Monos:  8.1 (12/21/2009)   Eosinophils:  1.2 (12/21/2009)   Basophil:  0.4 (12/21/2009)  Complete Metabolic Panel   Glucose:  89 (12/21/2009)   Sodium:  141 (12/21/2009)   Potassium:  4.3 (12/21/2009)   Chloride:  107 (12/21/2009)   CO2:  27 (12/21/2009)   BUN:  11 (12/21/2009)   Creatinine:  0.9 (12/21/2009)   Albumin:  4.4 (12/21/2009)   Total Protein:  7.6 (12/21/2009)   Calcium:  9.6 (12/21/2009)   Total Bili:  0.5 (12/21/2009)   Alk Phos:  65 (12/21/2009)   SGPT (ALT):  22 (12/21/2009)   SGOT (AST):  18 (12/21/2009)   Medications Prior to Update: 1)  Abilify 10 Mg Tabs (Aripiprazole) .... Take 1 Tab By Mouth At Bedtime 2)  Paxil 30 Mg Tabs (Paroxetine Hcl) .... Take 2 Tablet By Mouth Once A Day 3)  Trazodone Hcl 100 Mg Tabs (Trazodone Hcl) .... Take 3 Tab By Mouth At Bedtime 4)  Coreg 12.5 Mg Tabs (Carvedilol) .... Take 1 Tablet By Mouth Two Times A Day 5)  Vytorin 10-40 Mg Tabs (Ezetimibe-Simvastatin) .... Take 1 Tab By Mouth  At Bedtime 6)  Protonix 40 Mg Tbec (Pantoprazole Sodium) .... Take 1 Tablet By Mouth Twice A Day ( Before Breakfast and Supper) 7)  Pepcid Ac 10 Mg Tabs (Famotidine) .... Take 1 Tab By Mouth At Bedtime 8)  Xanax 1 Mg Tabs (Alprazolam) .Marland Kitchen.. 1 Tab By Mouth Every 8 Hours As Needed 9)  Ventolin Hfa 108 (90 Base) Mcg/act Aers (Albuterol Sulfate) .... Inhale 2 Puffs Every Four Hours As Needed 10)  Albuterol Sulfate (2.5 Mg/48ml) 0.083% Nebu (Albuterol Sulfate) .Marland Kitchen.. 1vial Via Neb Every 4 Hrs As Needed 11)  Vicodin 5-500 Mg Tabs (Hydrocodone-Acetaminophen) .Marland Kitchen.. 1 Tab By Mouth Up To Five Times Daily As Needed 12)  Fluconazole 200 Mg Tabs (Fluconazole) .... One By Mouth Once Daily For 30 Days 13)  Spiriva Handihaler 18 Mcg Caps (Tiotropium Bromide Monohydrate) .... One Inhalation Once Daily 14)  Doxycycline Hyclate 100 Mg Tabs (Doxycycline Hyclate) .... One By Mouth Two Times A Day For 30 Days  Current Medications (verified): 1)  Abilify 10 Mg Tabs (Aripiprazole) .... Take 1 Tab By Mouth At Bedtime 2)  Paxil 30 Mg Tabs (Paroxetine Hcl) .... Take 2 Tablet By Mouth Once A Day 3)  Trazodone Hcl 100 Mg Tabs (Trazodone Hcl) .... Take 3 Tab By Mouth At Bedtime 4)  Coreg 12.5 Mg Tabs (Carvedilol) .... Take 1 Tablet By Mouth Two Times A Day 5)  Protonix 40 Mg Tbec (Pantoprazole Sodium) .... Take 1 Tablet By Mouth Twice A Day ( Before Breakfast and Supper) 6)  Pepcid Ac 10 Mg Tabs (Famotidine) .... Take 1 Tab By Mouth At Bedtime 7)  Xanax 1 Mg Tabs (Alprazolam) .Marland Kitchen.. 1 Tab By Mouth Every 8 Hours As Needed 8)  Ventolin Hfa 108 (90 Base) Mcg/act Aers (Albuterol Sulfate) .... Inhale 2 Puffs Every Four Hours As Needed 9)  Albuterol Sulfate (2.5 Mg/63ml) 0.083% Nebu (Albuterol Sulfate) .Marland Kitchen.. 1vial Via Neb Every 4 Hrs As Needed 10)  Vicodin 5-500 Mg Tabs (Hydrocodone-Acetaminophen) .Marland Kitchen.. 1 Tab By Mouth Up To Five Times Daily As Needed 11)  Fluconazole 200 Mg Tabs (Fluconazole) .... One By Mouth Once Daily For 30  Days 12)  Doxycycline Hyclate 100 Mg Tabs (Doxycycline Hyclate) .... One By Mouth Two Times A Day For 30 Days 13)  Symbicort 160-4.5 Mcg/act Aero (Budesonide-Formoterol Fumarate) 14)  Crestor 20 Mg Tabs (Rosuvastatin Calcium) .... One By Mouth Once  Daily For Cholesterol  Allergies (verified): 1)  ! Ace Inhibitors 2)  ! Pcn 3)  ! * Blood Thiners 4)  ! Plavix (Clopidogrel Bisulfate)  Past History:  Past Medical History: Reviewed history from 10/02/2009 and no changes required. 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 Allyson Sabal)  Family History: Reviewed history from 10/05/2009 and no changes required. Family History of Alcoholism/Addiction Family History of Arthritis Family History Diabetes 3/6 sibs Family History High cholesterol Family History Hypertension Family History of Cardiovascular disorder Family History of Colon Cancer:Mother Family History of Colon Polyps:sisters  Mother alive at 65 with colon cancer,CVA Father deceased MI at age 56  Social History: Reviewed history from 10/23/2009 and no changes required. Disabled due to  paranoid/schizophrenia Single Current Smoker 1ppd, started smoking 1974. Alcohol use-no Drug use-no Regular exercise-no Illicit Drug Use - no Packs/Day:  1.5  Review of Systems  The patient denies anorexia, fever, weight loss, weight gain, chest pain, syncope, peripheral edema, prolonged cough, headaches, hemoptysis, abdominal pain, hematuria, suspicious skin lesions, depression, unusual weight change, enlarged lymph nodes, and angioedema.   General:  Denies chills, fatigue, fever, loss of appetite, malaise, sleep disorder, sweats, weakness, and weight loss. CV:  Denies chest pain or discomfort, difficulty breathing while lying down, fainting, fatigue, lightheadness, near fainting, palpitations, swelling of feet, and weight gain. GU:  Denies discharge, dysuria, genital sores, hematuria, incontinence, nocturia, urinary frequency, and urinary hesitancy. Endo:  Denies cold intolerance, excessive hunger, excessive thirst, excessive urination, heat intolerance, polyuria, and weight change.  Physical Exam  General:  alert, well-developed, well-nourished, and well-hydrated.   Head:  normocephalic, atraumatic, no abnormalities observed, and no abnormalities palpated.   Eyes:  vision grossly intact, pupils equal, pupils round, and pupils reactive to light.   Ears:  R ear normal and L ear normal.   Mouth:  white plaque/exudate on tongue with mild erythema. Neck:  supple, full ROM, no masses, no thyromegaly, no thyroid nodules or tenderness, no JVD, no carotid bruits, no cervical lymphadenopathy, and no neck tenderness.   Lungs:  normal respiratory effort, no intercostal retractions, no accessory muscle use, and normal breath sounds.   Heart:  Normal rate and regular rhythm. S1 and S2 normal without gallop, murmur, click, rub or other extra sounds. Abdomen:  soft, non-tender, normal bowel sounds, no distention, no hepatomegaly, and no splenomegaly.   Msk:  normal ROM, no joint tenderness, no joint  swelling, no joint warmth, and no redness over joints.   Pulses:  R femoral decreased, R popliteal decreased, R posterior tibial decreased, R dorsalis pedis decreased, L femoral decreased, L popliteal decreased, L posterior tibial decreased, and L dorsalis pedis decreased.   Extremities:  No clubbing, cyanosis, edema, or deformity noted with normal full range of motion of all joints.   Neurologic:  No cranial nerve deficits noted. Station and gait are normal. Plantar reflexes are down-going bilaterally.  DTRs are symmetrical throughout. Sensory, motor and coordinative functions appear intact. Skin:  turgor normal, color normal, no rashes, no suspicious lesions, no ecchymoses, no petechiae, and no purpura.   Cervical Nodes:  no anterior cervical adenopathy and no posterior cervical adenopathy.   Psych:  Cognition and judgment appear intact. Alert and cooperative with normal attention span and concentration. No apparent delusions, illusions, hallucinations  Diabetes Management Exam:    Foot Exam (with socks and/or shoes not present):       Sensory-Pinprick/Light touch:          Left medial foot (L-4): normal          Left dorsal foot (L-5): normal          Left lateral foot (S-1): normal          Right medial foot (L-4): normal          Right dorsal foot (L-5): normal          Right lateral foot (S-1): normal       Sensory-Monofilament:          Left foot: normal          Right foot: normal       Inspection:          Left foot: normal          Right foot: normal       Nails:          Left foot: normal          Right foot: normal   Impression & Recommendations:  Problem # 1:  ACUTE PROSTATITIS (ICD-601.0) Assessment Improved  Orders: Venipuncture (85462) TLB-Udip w/ Micro (81001-URINE) T-HIV Antibody  (Reflex) ([id number])  Problem # 2:  TOBACCO ABUSE (ICD-305.1) Assessment: Deteriorated  Encouraged smoking cessation and discussed different methods for smoking cessation.    Orders: Tobacco use cessation intermediate 3-10 minutes (82993)  Problem # 3:  HYPERTENSION, BENIGN (ICD-401.1) Assessment: Improved  His updated medication list for this problem includes:    Coreg 12.5 Mg Tabs (Carvedilol) .Marland Kitchen... Take 1 tablet by mouth two times a day  BP today: 110/70 Prior BP: 110/60 (12/21/2009)  Prior 10 Yr Risk Heart Disease: Not enough information (04/15/2009)  Labs Reviewed: K+: 4.3 (12/21/2009) Creat: : 0.9 (12/21/2009)   Chol: 236 (06/23/2008)   HDL: 30.9 (06/23/2008)   LDL: DEL (06/23/2008)   TG: 135 (06/23/2008)  Problem # 4:  SEXUAL ACTIVITY, HIGH RISK (ICD-V69.2) Assessment: Unchanged  Orders: Venipuncture (71696) TLB-Udip w/ Micro (81001-URINE) T-HIV Antibody  (Reflex) ([id number])  Problem # 5:  COPD (ICD-496) Assessment: Unchanged  The following medications were removed from the medication list:    Spiriva Handihaler 18 Mcg Caps (Tiotropium bromide monohydrate) ..... One inhalation once daily His updated medication list for this problem includes:    Ventolin Hfa 108 (90 Base) Mcg/act Aers (Albuterol sulfate) ..... Inhale 2 puffs every four hours as needed    Albuterol Sulfate (2.5 Mg/27ml) 0.083% Nebu (Albuterol sulfate) .Marland Kitchen... 1vial via neb every 4 hrs as needed    Symbicort 160-4.5 Mcg/act Aero (Budesonide-formoterol fumarate)  Orders: Tobacco use cessation intermediate 3-10 minutes (99406)  Problem # 6:  MIXED HYPERLIPIDEMIA (ICD-272.2) Assessment: Unchanged  The following medications were removed from the medication list:    Vytorin 10-40 Mg Tabs (Ezetimibe-simvastatin) .Marland Kitchen... Take 1 tab by mouth at bedtime His updated medication list for this problem includes:    Crestor 20 Mg Tabs (Rosuvastatin calcium) ..... One by mouth once daily for cholesterol  Labs Reviewed: SGOT: 18 (12/21/2009)   SGPT: 22 (12/21/2009)  Lipid Goals: Chol Goal: 200 (07/21/2008)   HDL Goal: 40 (07/21/2008)   LDL Goal: 100 (07/21/2008)   TG Goal: 150  (07/21/2008)  Prior 10 Yr Risk Heart Disease: Not enough information (04/15/2009)   HDL:30.9 (06/23/2008)  LDL:DEL (06/23/2008)  Chol:236 (06/23/2008)  Trig:135 (06/23/2008)  Problem # 7:  PVD (ICD-443.9) Assessment: Unchanged  Orders: Tobacco use cessation intermediate 3-10 minutes (99406)  Complete Medication List: 1)  Abilify 10 Mg Tabs (Aripiprazole) .... Take 1 tab by mouth at bedtime 2)  Paxil 30 Mg Tabs (Paroxetine hcl) .... Take 2 tablet by mouth once a day 3)  Trazodone Hcl 100 Mg Tabs (Trazodone hcl) .... Take 3 tab by mouth at bedtime 4)  Coreg 12.5 Mg Tabs (Carvedilol) .... Take 1 tablet by mouth two times a day 5)  Protonix 40 Mg Tbec (Pantoprazole sodium) .... Take 1 tablet by mouth twice a day ( before breakfast and supper) 6)  Pepcid Ac 10 Mg Tabs (Famotidine) .... Take 1 tab by mouth at bedtime 7)  Xanax 1 Mg Tabs (Alprazolam) .Marland Kitchen.. 1 tab by mouth every 8 hours as needed 8)  Ventolin Hfa 108 (90 Base) Mcg/act Aers (Albuterol sulfate) .... Inhale 2 puffs every four hours as needed 9)  Albuterol Sulfate (2.5 Mg/44ml) 0.083% Nebu (Albuterol sulfate) .Marland Kitchen.. 1vial via neb every 4 hrs as needed 10)  Vicodin 5-500 Mg Tabs (Hydrocodone-acetaminophen) .Marland Kitchen.. 1 tab by mouth up to five times daily as needed 11)  Fluconazole 200 Mg Tabs (Fluconazole) .... One by mouth once daily for 30 days 12)  Doxycycline Hyclate 100 Mg Tabs (Doxycycline hyclate) .... One by mouth two times a day for 30 days 13)  Symbicort 160-4.5 Mcg/act Aero (Budesonide-formoterol fumarate) 14)  Crestor 20 Mg Tabs (Rosuvastatin calcium) .... One by mouth once daily for cholesterol  Patient Instructions: 1)  Please schedule a follow-up appointment in 2 months. 2)  Tobacco is very bad for your health and your loved ones! You Should stop smoking!. 3)  Stop Smoking Tips: Choose a Quit date. Cut down before the Quit date. decide what you will do as a substitute when you feel the urge to  smoke(gum,toothpick,exercise). 4)  It is important that you exercise regularly at least 20 minutes 5 times a week. If you develop chest pain, have severe difficulty breathing, or feel very tired , stop exercising immediately and seek medical attention. 5)  You need to lose weight. Consider a lower calorie diet and regular exercise.  6)  If you could be exposed to sexually transmitted diseases, you should use a condom. 7)  Check your blood sugars regularly. If your readings are usually above 200  or below 70 you should contact our office. 8)  It is important that your Diabetic A1c level is checked every 3 months. 9)  See your eye doctor yearly to check for diabetic eye damage. 10)  Check your feet each night for sore areas, calluses or signs of infection. 11)  Check your Blood Pressure regularly. If it is above 130/80: you should make an appointment. Prescriptions: CRESTOR 20 MG TABS (ROSUVASTATIN CALCIUM) One by mouth once daily for cholesterol  #126 x 0   Entered and Authorized by:   Ronnie Grandchild MD   Signed by:   Ronnie Grandchild MD on 02/15/2010   Method used:   Samples Given   RxID:   [id number]

## 2010-10-26 NOTE — Progress Notes (Signed)
Summary: refill  Phone Note Refill Request Message from:  Patient on October 14, 2009 10:35 AM  Refills Requested: Medication #1:  VICODIN 5-500 MG TABS four times a day as needed  Is this ok to refill?  Initial call taken by: Rock Nephew CMA,  October 14, 2009 10:35 AM  Follow-up for Phone Call        yes Follow-up by: Etta Grandchild MD,  October 14, 2009 10:37 AM    Prescriptions: VICODIN 5-500 MG TABS (HYDROCODONE-ACETAMINOPHEN) four times a day as needed  #90 x 4   Entered by:   Rock Nephew CMA   Authorized by:   Etta Grandchild MD   Signed by:   Rock Nephew CMA on 10/14/2009   Method used:   Telephoned to ...       [facility] (retail)       [address]       [place], [place]  [id number]       Ph: [phone]       Fax: [phone]   RxID:   [id number]

## 2010-10-26 NOTE — Progress Notes (Signed)
Summary: ACIPHEX NEED PA  Phone Note Call from Patient Call back at [phone]   Summary of Call: Per patient, Aciphex needs PA, Patient provided # [phone]. He has used all alternatives in the past.  Initial call taken by: Lamar Sprinkles, CMA,  April 28, 2010 2:00 PM  Follow-up for Phone Call        PA-Aciphex faxed to [phone], awaiting approval. Dagoberto Reef  April 30, 2010 2:13 PM  Aciphex deined not a covererd drug. Please advise. Follow-up by: Dagoberto Reef,  April 30, 2010 4:12 PM  Additional Follow-up for Phone Call Additional follow up Details #1::        what is covered? Additional Follow-up by: Etta Grandchild MD,  April 30, 2010 4:18 PM    Additional Follow-up for Phone Call Additional follow up Details #2::    Preferrred drugs are dexilant, nexium, generic zegend and generic prevacid.   Dagoberto Reef  May 06, 2010 4:45 PM  Follow-up by: Etta Grandchild MD,  May 06, 2010 4:46 PM  New/Updated Medications: NEXIUM 40 MG CPDR (ESOMEPRAZOLE MAGNESIUM) one by mouth once daily Prescriptions: NEXIUM 40 MG CPDR (ESOMEPRAZOLE MAGNESIUM) one by mouth once daily  #30 x 11   Entered and Authorized by:   Etta Grandchild MD   Signed by:   Etta Grandchild MD on 05/06/2010   Method used:   Electronically to        [facility] (retail)       [address]       [place], [place]  [id number]       Ph: [phone] or [phone]       Fax: [phone]   RxID:   [id number]

## 2010-10-26 NOTE — Progress Notes (Signed)
Summary: results  Phone Note Call from Patient Call back at Home Phone [phone]   Caller: Patient Call For: ramaswamy Reason for Call: Talk to Nurse Summary of Call: looking for pulse ox reading. Initial call taken by: Eugene Gavia,  November 09, 2009 9:36 AM  Follow-up for Phone Call        Please advise results on ONO thanks Vernie Murders  November 09, 2009 9:36 AM   Additional Follow-up for Phone Call Additional follow up Details #1::        i do not remember ordering this Candise Bowens. Maybe [facility] has done this. Please check my box. If not there, pls have home care fax it  Additional Follow-up by: Kalman Shan MD,  November 12, 2009 3:44 PM    Additional Follow-up for Phone Call Additional follow up Details #2::    looks like TP ordered this, sorry it should not have been sent to you. I will ask TP if she has seen report yet. Carron Curie CMA  November 12, 2009 3:58 PM  Spoke to [place] and she has not seen report so I call [facility] and what happened was the pt did not do the test correctly onthe first try, so they had to teach the pt how to use the machine. It was just picked up today to be reviewed. Rep at [facility] told me she had it on her desk and will have the report to [place] in 30 minutes for review. I will then send to TP. Carron Curie CMA  November 12, 2009 3:59 PM  i still have not received the ONO results.  i was unable to find the ONO at the triage fax, the medical records fax at the front desk or at the [facility] fax.  i noticed that the patient saw RB on 11-10-08 and wondered if perhaps it went to him-it was not in his paperwork folder but he did send down his "scan" folder this morning.  called medical records to ask them to look in his folder, just in case.  was told they will look and call me back. Boone Master CNA  November 13, 2009 10:42 AM   received call-back from medical records.  no sign of pt's ONO.  called [facility] to see if they can re-fax.  was told that it was  faxed yesterday afternoon, but asked if they could resend anyway.  ONO results received. Boone Master CNA  November 13, 2009 11:01 AM   Additional Follow-up for Phone Call Additional follow up Details #3:: Details for Additional Follow-up Action Taken: called spoke with patient.  i apologized for the delay in contacting him again.  informed pt that TP nor MR are in the office today.  pt stated that he saw RB on 11-10-09 and asked if he could look at the results.  i told pt that that is up to RB, but that i will ask and call him back either way. Boone Master CNA  November 13, 2009 11:09 AM   i handed Lawson Fiscal the pt's ONO and explained to her the sitaution.  she stated that she will ask RB if he would mind looking at the ONO for the pt.  i told her that the phone msg is in my "inbox" and that i will call the patient with RB's response. Boone Master CNA  November 13, 2009 11:15 AM   I have reviewed study, scanned it in. he does not need O2 at night. Leslye Peer MD  November 13, 2009 12:35 PM   called spoke with patient.  advised of ONO results.  pt verbalized is understanding. Boone Master CNA  November 13, 2009 2:59 PM

## 2010-10-26 NOTE — Progress Notes (Signed)
Summary: med needs prior auth   Phone Note Call from Patient   Caller: Patient [phone] Reason for Call: Talk to Nurse Summary of Call: pt needs prior authorization for the viagra we gave him -[id number] Initial call taken by: Glynda Jaeger,  April 29, 2010 8:34 AM  Follow-up for Phone Call        Spoke with pt's insurance at above number. Information given for prior approval. Information will be reviewed and we will receive fax with decision. Pt will also receive phone call from insurance as to decision. I called pt with this information Dossie Arbour, RN, BSN  April 29, 2010 4:34 PM Received fax from [employer] that viagra has been denied and that erectile dysfunction drugs are not covered.  Pt given this information.  Follow-up by: Dossie Arbour, RN, BSN,  May 03, 2010 11:14 AM
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
## 2010-10-26 NOTE — Assessment & Plan Note (Signed)
Summary: 2 months/apc   Visit Type:  Follow-up Copy to:  DR, Yetta Barre Primary Provider/Referring Provider:  Etta Grandchild MD  CC:  2 month follow up.  Pt states breathing is better-currently walking 1 hour every morning with no problems.  Has not used nebs or rescue inhaler in approx 2 months.  Cough has also improved-still having an occ dry cough.  Pt still smoking 1-1 1/2 ppd.   .  History of Present Illness: Followup Gold stage 2 COPD (fev1 77% , RV 157%, DLCO 49% feb 2011, CT 2009 with emphysema, low dlco, heavy smoker), and Tobacco abuse. Hx of intolerance to spriiva (late 2010) due to sore mouth  OV 03/17/2010: Last visit was in March 2011. AT that visit we increased symbicort at his request. He also had thrush that we treated with nystatin swish and swallow. HE now reports continued improvement in dyspnea. COmpliant iwth symbicort. Walks 30 minutes twice daily without symptoms. Last albuterol rescure use was > 2 months ago. No cough. In terms of smoking, unable to quit smoking. He quit chantix  2 weeks ago. Is going to try nicotine gum. He is interested in the quit smoking pgm at [facility]. He is c/o chronic allergies (nose, throat and sinus)  and wants something more than zyrtec.   Preventive Screening-Counseling & Management  Alcohol-Tobacco     Smoking Status: current     Smoking Cessation Counseling: yes     Smoke Cessation Stage: contemplative     Packs/Day: 1.0 - 1.5     Year Started: 1974  Current Medications (verified): 1)  Paxil 30 Mg Tabs (Paroxetine Hcl) .... Take 2 Tablet By Mouth Once A Day 2)  Trazodone Hcl 100 Mg Tabs (Trazodone Hcl) .... Take 3 Tab By Mouth At Bedtime 3)  Coreg 12.5 Mg Tabs (Carvedilol) .... Take 1 Tablet By Mouth Once Daily 4)  Xanax 1 Mg Tabs (Alprazolam) .Marland Kitchen.. 1 Tab By Mouth Every 8 Hours As Needed 5)  Ventolin Hfa 108 (90 Base) Mcg/act Aers (Albuterol Sulfate) .... Inhale 2 Puffs Every Four Hours As Needed 6)  Albuterol Sulfate (2.5 Mg/26ml) 0.083% Nebu  (Albuterol Sulfate) .Marland Kitchen.. 1vial Via Neb Every 4 Hrs As Needed 7)  Vicodin 5-500 Mg Tabs (Hydrocodone-Acetaminophen) .Marland Kitchen.. 1 Tab By Mouth Up To Five Times Daily As Needed 8)  Fluconazole 200 Mg Tabs (Fluconazole) .... One By Mouth Once Daily For 30 Days 9)  Doxycycline Hyclate 100 Mg Tabs (Doxycycline Hyclate) .... One By Mouth Two Times A Day For 30 Days 10)  Symbicort 160-4.5 Mcg/act Aero (Budesonide-Formoterol Fumarate) .... Inhale 2 Puffs Two Times A Day 11)  Crestor 20 Mg Tabs (Rosuvastatin Calcium) .... One By Mouth Once Daily For Cholesterol 12)  Aciphex 20 Mg Tbec (Rabeprazole Sodium) .... Once Daily For Acid Reflux 13)  Zyrtec Allergy 10 Mg Tabs (Cetirizine Hcl) .... Take 1 Tablet By Mouth Once A Day  Allergies (verified): 1)  ! Ace Inhibitors 2)  ! Pcn 3)  ! * Blood Thiners 4)  ! 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 Allyson Sabal)  Family History: Last updated: 10/05/2009 Family History of Alcoholism/Addiction Family History of Arthritis Family History Diabetes 3/6 sibs Family History High  cholesterol Family History Hypertension Family History of Cardiovascular disorder Family History of Colon Cancer:Mother Family History of Colon Polyps:sisters  Mother alive at 48 with colon cancer,CVA Father deceased MI at age 10  Social History: Last updated: 10/23/2009 Disabled due to paranoid/schizophrenia Single Current Smoker 1ppd, started smoking 1974. Alcohol use-no Drug use-no Regular exercise-no Illicit Drug Use - no  Risk Factors: Alcohol Use: 0 (02/15/2010) Exercise: yes (06/23/2008)  Risk Factors: Smoking Status: current (03/17/2010) Packs/Day: 1.0 - 1.5 (03/17/2010) Cans of tobacco/wk: no (02/15/2010) Passive Smoke Exposure: no (02/15/2010)  Family History: Reviewed history from 10/05/2009 and no changes required. Family History of Alcoholism/Addiction Family History of Arthritis Family History Diabetes 3/6 sibs Family History High cholesterol Family History Hypertension Family History of Cardiovascular disorder Family History of Colon Cancer:Mother Family History of Colon Polyps:sisters  Mother alive at 96 with colon cancer,CVA Father deceased MI at age 33  Social History: Reviewed history from 10/23/2009 and no changes required. 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 Alcohol use-no Drug use-no Regular exercise-no Illicit Drug Use - no Packs/Day:  1.0 - 1.5  Review of Systems       The patient complains of non-productive cough, nasal congestion/difficulty breathing through nose, sneezing, ear ache, anxiety, and depression.  The patient denies shortness of breath with activity, shortness of breath at rest, productive cough, coughing up blood, chest pain, irregular heartbeats, acid heartburn, indigestion, loss of appetite, weight change, abdominal pain, difficulty swallowing, sore throat, tooth/dental problems, headaches, itching, hand/feet swelling, joint stiffness or pain, rash, change in color of  mucus, and fever.    Vital Signs:  Patient profile:   50 year old male Height:      75 inches Weight:      223 pounds BMI:     27.97 O2 Sat:      97 % on Room air Temp:     98.2 degrees F oral Pulse rate:   97 / minute BP sitting:   112 / 60  (right arm) Cuff size:   regular  Vitals Entered By: Gweneth Dimitri RN (March 17, 2010 9:38 AM)  O2 Flow:  Room air CC: 2 month follow up.  Pt states breathing is better-currently walking 1 hour every morning with no problems.  Has not used nebs or rescue inhaler in approx 2 months.  Cough has also improved-still having an occ dry cough.  Pt still smoking 1-1 1/2 ppd.    Comments Medications reviewed with patient Daytime contact number verified with patient. Gweneth Dimitri RN  March 17, 2010 9:38 AM    Physical Exam  General:  alert, well-developed, well-nourished, and well-hydrated.   Head:  normocephalic and atraumatic Eyes:  PERRLA/EOM intact; conjunctiva and sclera clear Ears:  R ear normal and L ear normal.   Nose:  no deformity, discharge, inflammation, or lesions Mouth:  very mild thrhsh on uvula.  no ulcers Neck:  scar of old tracheostomy + Chest Wall:  No deformities, masses, tenderness or gynecomastia noted. Lungs:  normal respiratory effort, no intercostal retractions, no accessory muscle use, and normal breath sounds.   Heart:  Normal rate and  regular rhythm. S1 and S2 normal without gallop, murmur, click, rub or other extra sounds. Abdomen:  not examined Msk:  normal ROM, no joint tenderness, no joint swelling, no joint warmth, and no redness over joints.   Pulses:  R femoral decreased, R popliteal decreased, R posterior tibial decreased, R dorsalis pedis decreased, L femoral decreased, L popliteal decreased, L posterior tibial decreased, and L dorsalis pedis decreased.   Extremities:  no clubbing, cyanosis, edema, or deformity noted Neurologic:  non-focal Skin:  turgor normal, color normal, no rashes, no suspicious lesions, no  ecchymoses, no petechiae, and no purpura.   Cervical Nodes:  no anterior cervical adenopathy and no posterior cervical adenopathy.   Axillary Nodes:  no R axillary adenopathy and no L axillary adenopathy.   Psych:  anxious, a bit tangential but easily redirected.    Impression & Recommendations:  Problem # 1:  ALLERGIC RHINITIS CAUSE UNSPECIFIED (ICD-477.9) Assessment New continue zyrtec try generic flonase for nasal allergies refer Dr. Maple Hudson (he is interested and is requesting an allergist referral) His updated medication list for this problem includes:    Zyrtec Allergy 10 Mg Tabs (Cetirizine hcl) .Marland Kitchen... Take 1 tablet by mouth once a day    Fluticasone Propionate 50 Mcg/act Susp (Fluticasone propionate) .Marland Kitchen... 2 squirts each nostril bid  Orders: Allergy Referral  (Allergy) Prescription Created Electronically [id number]) Est. Patient Level IV (60454)  Problem # 2:  TOBACCO ABUSE (ICD-305.1) Assessment: Unchanged  unable to quit. Intoleranct to chnatix. Will set him up at [facility] quit smoking prgm.   5 minutes counselling  Orders: Est. Patient Level IV (09811) Tobacco use cessation intermediate 3-10 minutes (91478)  Problem # 3:  THRUSH (ICD-112.0) Assessment: Improved  resolved  plan daily mouth wash after symbicort  Orders: Est. Patient Level IV (29562)  Problem # 4:  COPD (ICD-496) Assessment: Unchanged stable disease  plan daily symbicort refills sent daily exercises to continue  Medications Added to Medication List This Visit: 1)  Coreg 12.5 Mg Tabs (Carvedilol) .... Take 1 tablet by mouth once daily 2)  Symbicort 160-4.5 Mcg/act Aero (Budesonide-formoterol fumarate) .... Inhale 2 puffs two times a day 3)  Zyrtec Allergy 10 Mg Tabs (Cetirizine hcl) .... Take 1 tablet by mouth once a day 4)  Fluticasone Propionate 50 Mcg/act Susp (Fluticasone propionate) .... 2 squirts each nostril bid  Patient Instructions: 1)  please continue symbicort 2)  focus on weight  loss 3)  continue your exercise 4)  attend quit smoking program at [facility] 5)  please see Allergist Dr. Jetty Duhamel in our office 6)  return in 6 months or sooner if there are problems Prescriptions: FLUTICASONE PROPIONATE 50 MCG/ACT SUSP (FLUTICASONE PROPIONATE) 2 squirts each nostril bid  #1 x 6   Entered and Authorized by:   Kalman Shan MD   Signed by:   Kalman Shan MD on 03/17/2010   Method used:   Electronically to        [facility] [phone]* (retail)       [address]       [place], [place]  [address]       Ph: [phone]       Fax: [phone]   RxID:   [id number] VENTOLIN HFA 108 (90 BASE) MCG/ACT AERS (ALBUTEROL SULFATE) Inhale 2 puffs every four hours as needed  #1 x 3   Entered and Authorized by:   Kalman Shan MD   Signed by:   Kalman Shan MD on 03/17/2010   Method used:  Electronically to        [facility] [phone]* (retail)       [address]       [place], [place]  [address]       Ph: [phone]       Fax: [phone]   RxID:   [id number] SYMBICORT 160-4.5 MCG/ACT AERO (BUDESONIDE-FORMOTEROL FUMARATE) Inhale 2 puffs two times a day  #1 x 6   Entered and Authorized by:   Kalman Shan MD   Signed by:   Kalman Shan MD on 03/17/2010   Method used:   Electronically to        [facility] [phone]* (retail)       [address]       [place], [place]  [address]       Ph: [phone]       Fax: [phone]   RxID:   [id number]

## 2010-10-26 NOTE — Medication Information (Signed)
Summary: Prior [employer] DENIED for Aciphex/[facility]  Prior [employer] DENIED for Aciphex/[facility]   Imported By: Sherian Rein 05/04/2010 11:41:14  _____________________________________________________________________  External Attachment:    Type:   Image     Comment:   External Document

## 2010-10-26 NOTE — Assessment & Plan Note (Signed)
Summary: follow up-lb   Vital Signs:  Patient profile:   50 year old male Height:      75 inches Weight:      219.75 pounds BMI:     27.57 O2 Sat:      97 % on Room air Temp:     97.9 degrees F oral Pulse rate:   70 / minute Pulse rhythm:   regular Resp:     16 per minute BP sitting:   122 / 66  (left arm) Cuff size:   large  Vitals Entered By: Rock Nephew CMA (May 07, 2010 8:12 AM)  Nutrition Counseling: Patient's BMI is greater than 25 and therefore counseled on weight management options.  O2 Flow:  Room air CC: follow-up visit// request lab order, Lipid Management Is Patient Diabetic? Yes Did you bring your meter with you today? No Pain Assessment Patient in pain? no        Primary Care Provider:  Etta Grandchild MD  CC:  follow-up visit// request lab order and Lipid Management.  History of Present Illness:  Follow-Up Visit      This is a 50 year old man who presents for Follow-up visit.  The patient denies chest pain, palpitations, dizziness, syncope, low blood sugar symptoms, high blood sugar symptoms, edema, SOB, DOE, PND, and orthopnea.  Since the last visit the patient notes no new problems or concerns.  The patient reports taking meds as prescribed, monitoring BP, monitoring blood sugars, and dietary compliance.  When questioned about possible medication side effects, the patient notes none.    Lipid Management History:      Positive NCEP/ATP III risk factors include male age 51 years old or older, diabetes, HDL cholesterol less than 40, current tobacco user, hypertension, and peripheral vascular disease.  Negative NCEP/ATP III risk factors include no family history for ischemic heart disease, no ASHD (atherosclerotic heart disease), no prior stroke/TIA, and no history of aortic aneurysm.        The patient states that he knows about the "Therapeutic Lifestyle Change" diet.  His compliance with the TLC diet is poor.  The patient expresses understanding of  adjunctive measures for cholesterol lowering.  Adjunctive measures started by the patient include fiber, ASA, limit alcohol consumpton, and weight reduction.  He expresses no side effects from his lipid-lowering medication.  The patient denies any symptoms to suggest myopathy or liver disease.    Preventive Screening-Counseling & Management  Alcohol-Tobacco     Alcohol drinks/day: 0     Smoking Status: current     Smoking Cessation Counseling: yes     Smoke Cessation Stage: contemplative     Packs/Day: 1.0 - 1.5     Year Started: 1974     Cans of tobacco/week: no     Passive Smoke Exposure: no     Tobacco Counseling: to quit use of tobacco products  Hep-HIV-STD-Contraception     Hepatitis Risk: risk noted     Hepatitis Risk Counseling: to avoid increased hepatitis risk     HIV Risk: risk noted     HIV Risk Counseling: to avoid increased HIV risk     STD Risk: risk noted     STD Risk Counseling: to avoid increased STD risk      Sexual History:  multiple partners currently.        Drug Use:  never.        Blood Transfusions:  no.    Clinical Review Panels:  Lipid Management  Cholesterol:  236 (06/23/2008)   LDL (bad choesterol):  DEL (06/23/2008)   HDL (good cholesterol):  30.9 (06/23/2008)  Diabetes Management   HgBA1C:  6.3 (12/21/2009)   Creatinine:  0.9 (12/21/2009)   Last Foot Exam:  yes (05/07/2010)   Last Flu Vaccine:  Fluvax 3+ (06/23/2009)   Last Pneumovax:  Pneumovax (12/06/2007)  CBC   WBC:  8.9 (12/21/2009)   RBC:  5.35 (12/21/2009)   Hgb:  16.5 (12/21/2009)   Hct:  49.8 (12/21/2009)   Platelets:  122.0 (12/21/2009)   MCV  93.0 (12/21/2009)   MCHC  33.1 (12/21/2009)   RDW  14.1 (12/21/2009)   PMN:  62.9 (12/21/2009)   Lymphs:  27.4 (12/21/2009)   Monos:  8.1 (12/21/2009)   Eosinophils:  1.2 (12/21/2009)   Basophil:  0.4 (12/21/2009)  Complete Metabolic Panel   Glucose:  89 (12/21/2009)   Sodium:  141 (12/21/2009)   Potassium:  4.3 (12/21/2009)    Chloride:  107 (12/21/2009)   CO2:  27 (12/21/2009)   BUN:  11 (12/21/2009)   Creatinine:  0.9 (12/21/2009)   Albumin:  4.4 (12/21/2009)   Total Protein:  7.6 (12/21/2009)   Calcium:  9.6 (12/21/2009)   Total Bili:  0.5 (12/21/2009)   Alk Phos:  65 (12/21/2009)   SGPT (ALT):  22 (12/21/2009)   SGOT (AST):  18 (12/21/2009)   Medications Prior to Update: 1)  Paxil 30 Mg Tabs (Paroxetine Hcl) .... Take 2 Tablet By Mouth Once A Day 2)  Trazodone Hcl 100 Mg Tabs (Trazodone Hcl) .... Take 3 Tab By Mouth At Bedtime 3)  Coreg 12.5 Mg Tabs (Carvedilol) .Marland Kitchen.. 1 Tab Two Times A Day 4)  Xanax 1 Mg Tabs (Alprazolam) .Marland Kitchen.. 1 Tab By Mouth Every 8 Hours As Needed 5)  Proair Hfa 108 (90 Base) Mcg/act Aers (Albuterol Sulfate) .... 2 Puffs Every 4-6 Hours As Needed For Sob 6)  Albuterol Sulfate (2.5 Mg/53ml) 0.083% Nebu (Albuterol Sulfate) .Marland Kitchen.. 1vial Via Neb Every 4 Hrs As Needed 7)  Vicodin Hp 10-660 Mg Tabs (Hydrocodone-Acetaminophen) .... One By Mouth Three Times A Day As Needed For Pain 8)  Spiriva Handihaler 18 Mcg Caps (Tiotropium Bromide Monohydrate) .Marland Kitchen.. 1 Inhalation Once Daily 9)  Crestor 20 Mg Tabs (Rosuvastatin Calcium) .... One By Mouth Once Daily For Cholesterol 10)  Lamisil 250 Mg Tabs (Terbinafine Hcl) .Marland Kitchen.. 1 Tab Two Times A Day 11)  Viagra 50 Mg Tabs (Sildenafil Citrate) .... Take As Needed As Directed 12)  Element Control Normal Liqd (Blood Glucose Calibration) .... Check Meter With Each New Vial of Test Strips or Irregular Readings 13)  Element Test  Strp (Glucose Blood) .... Test Once Daily 14)  Lancets  Misc (Lancets) .... Test Once Daily 15)  Lancing Device  Misc (Lancet Devices) .... Teast Once Daily 16)  Element Auto System .... Test Once Daily 17)  Flonase 50 Mcg/act Susp (Fluticasone Propionate) .Marland Kitchen.. 1-2 Sprays in Each Nostril Two Times A Day 18)  Nexium 40 Mg Cpdr (Esomeprazole Magnesium) .... One By Mouth Once Daily  Current Medications (verified): 1)  Paxil 30 Mg Tabs  (Paroxetine Hcl) .... Take 2 Tablet By Mouth Once A Day 2)  Trazodone Hcl 100 Mg Tabs (Trazodone Hcl) .... Take 3 Tab By Mouth At Bedtime 3)  Coreg 12.5 Mg Tabs (Carvedilol) .Marland Kitchen.. 1 Tab Two Times A Day 4)  Xanax 1 Mg Tabs (Alprazolam) .Marland Kitchen.. 1 Tab By Mouth Every 8 Hours As Needed 5)  Albuterol Sulfate (2.5 Mg/77ml) 0.083% Nebu (Albuterol  Sulfate) .Marland Kitchen.. 1vial Via Neb Every 4 Hrs As Needed 6)  Vicodin Hp 10-660 Mg Tabs (Hydrocodone-Acetaminophen) .... One By Mouth Qid As Needed For Pain 7)  Spiriva Handihaler 18 Mcg Caps (Tiotropium Bromide Monohydrate) .Marland Kitchen.. 1 Inhalation Once Daily 8)  Crestor 20 Mg Tabs (Rosuvastatin Calcium) .... One By Mouth Once Daily For Cholesterol 9)  Lamisil 250 Mg Tabs (Terbinafine Hcl) .Marland Kitchen.. 1 Tab Two Times A Day 10)  Element Control Normal Liqd (Blood Glucose Calibration) .... Check Meter With Each New Vial of Test Strips or Irregular Readings 11)  Element Test  Strp (Glucose Blood) .... Test Once Daily 12)  Lancets  Misc (Lancets) .... Test Once Daily 13)  Lancing Device  Misc (Lancet Devices) .... Teast Once Daily 14)  Element Auto System .... Test Once Daily 15)  Nexium 40 Mg Cpdr (Esomeprazole Magnesium) .... One By Mouth Once Daily  Allergies (verified): 1)  ! Ace Inhibitors 2)  ! Pcn 3)  ! * Blood Thiners 4)  ! 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 Alcohol use-no Drug use-no Regular exercise-no Illicit Drug Use - no  Risk Factors: Alcohol Use: 0 (05/07/2010) Exercise: yes (06/23/2008)  Risk Factors: Smoking Status: current (05/07/2010) Packs/Day: 1.0 - 1.5 (05/07/2010) Cans of tobacco/wk: no (05/07/2010) Passive Smoke Exposure: no (05/07/2010)  Family History: Reviewed history from 10/05/2009 and no changes required. Family History of Alcoholism/Addiction Family History of Arthritis Family History Diabetes 3/6 sibs Family History High cholesterol Family History Hypertension Family History of Cardiovascular disorder Family History of Colon Cancer:Mother Family History of Colon Polyps:sisters  Mother alive at 46 with colon cancer,CVA Father deceased MI at age 42  Social History: Reviewed history from 05/04/2010 and no changes required. 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 Alcohol use-no Drug use-no Regular exercise-no Illicit Drug Use -  no  Review of Systems       The patient complains of weight gain and prolonged cough.  The patient denies anorexia, fever, weight loss, chest pain, syncope, dyspnea on exertion, peripheral edema, headaches, hemoptysis, abdominal pain, hematuria, genital sores, suspicious skin lesions, angioedema, and testicular masses.   General:  Denies chills, fatigue, fever, loss of appetite, malaise, sleep disorder, sweats, weakness, and weight loss. Resp:  Complains of cough; denies chest discomfort, coughing up blood, excessive snoring, hypersomnolence, morning headaches, pleuritic, shortness of breath, sputum productive, and wheezing. MS:  Complains of low back pain; denies joint pain, joint redness, joint swelling, loss of strength, muscle aches, muscle, cramps, muscle weakness, stiffness, and thoracic pain. Heme:  Denies abnormal bruising, bleeding, enlarge lymph nodes, fevers, pallor, and skin discoloration.  Physical Exam  General:  alert, well-developed, well-nourished, and well-hydrated.   Head:  normocephalic, atraumatic, no abnormalities observed, and no abnormalities palpated.   Eyes:  vision grossly intact, pupils equal, pupils round, and pupils reactive to light.   Ears:  R ear normal and L ear normal.   Mouth:  good dentition and pharynx pink and moist.   Neck:  supple, full ROM, no masses, no thyromegaly, no thyroid nodules or tenderness, no JVD, no carotid bruits, no cervical lymphadenopathy, and no neck tenderness.   Lungs:  normal respiratory effort, no intercostal retractions, no accessory muscle use, and normal breath sounds.   Heart:  Normal rate and regular rhythm. S1 and S2 normal without gallop, murmur, click, rub or other extra sounds. Abdomen:  soft, non-tender, normal bowel sounds, no distention, no hepatomegaly, and no splenomegaly.   Msk:  normal ROM, no joint tenderness, no joint swelling, no joint warmth, and no redness over joints.   Pulses:  R femoral decreased, R popliteal  decreased, R posterior tibial decreased, R dorsalis pedis decreased, L femoral decreased, L popliteal decreased, L posterior tibial decreased, and L dorsalis pedis decreased.   Extremities:  No clubbing, cyanosis, edema, or deformity noted with normal full range of motion of all joints.   Neurologic:  No cranial nerve deficits noted. Station and gait are normal. Plantar reflexes are down-going bilaterally. DTRs are symmetrical throughout. Sensory, motor and coordinative functions appear intact. Skin:  turgor normal, color normal, no rashes, no suspicious lesions, no ecchymoses, no petechiae, and no purpura.   Cervical Nodes:  no anterior cervical adenopathy and no posterior cervical adenopathy.   Axillary Nodes:  no R axillary adenopathy and no L axillary adenopathy.   Inguinal Nodes:  no R inguinal adenopathy and no L inguinal adenopathy.   Psych:  Cognition and judgment appear intact. Alert and cooperative with normal attention span and concentration. No apparent delusions, illusions, hallucinations  Diabetes Management Exam:    Foot Exam (with socks and/or shoes not present):       Sensory-Pinprick/Light touch:          Left medial foot (L-4): normal          Left dorsal foot (L-5): normal          Left lateral foot (S-1): normal          Right medial foot (L-4): normal          Right dorsal foot (L-5): normal          Right lateral foot (S-1): normal       Sensory-Monofilament:          Left foot: normal          Right foot: normal       Inspection:          Left foot: normal          Right foot: normal       Nails:          Left foot: normal          Right foot: normal   Impression & Recommendations:  Problem # 1:  COUGH (ICD-786.2) Assessment New  Orders: T-2 View CXR (71020TC) Venipuncture (10272) TLB-Lipid Panel (80061-LIPID) TLB-BMP (Basic Metabolic Panel-BMET) (80048-METABOL) TLB-CBC Platelet - w/Differential (85025-CBCD) TLB-Hepatic/Liver Function Pnl  (80076-HEPATIC) TLB-A1C / Hgb A1C (Glycohemoglobin) (83036-A1C) TLB-Udip w/ Micro (81001-URINE) T-HIV Antibody  (Reflex) ([id number])  Problem # 2:  THRUSH (ICD-112.0) Assessment: Improved  Orders: Venipuncture (42595) TLB-Lipid Panel (80061-LIPID) TLB-BMP (Basic Metabolic Panel-BMET) (80048-METABOL) TLB-CBC Platelet - w/Differential (85025-CBCD) TLB-Hepatic/Liver Function Pnl (80076-HEPATIC) TLB-A1C / Hgb A1C (Glycohemoglobin) (83036-A1C) TLB-Udip w/ Micro (81001-URINE) T-HIV Antibody  (Reflex) ([id number])  Problem # 3:  TOBACCO ABUSE (ICD-305.1) Assessment: Unchanged  Encouraged smoking cessation and discussed different methods for smoking cessation.   Problem # 4:  HYPERTENSION, BENIGN (ICD-401.1) Assessment: Improved  His updated medication list for this problem includes:    Coreg 12.5 Mg Tabs (Carvedilol) .Marland Kitchen... 1 tab two times a day  Orders: Venipuncture (95188) TLB-Lipid Panel (80061-LIPID) TLB-BMP (Basic Metabolic Panel-BMET) (80048-METABOL) TLB-CBC Platelet - w/Differential (85025-CBCD) TLB-Hepatic/Liver Function Pnl (80076-HEPATIC) TLB-A1C / Hgb A1C (Glycohemoglobin) (83036-A1C) TLB-Udip w/ Micro (81001-URINE) T-HIV Antibody  (Reflex) ([id number])  BP today: 122/66 Prior BP: 120/68 (05/04/2010)  Prior 10 Yr Risk Heart Disease: Not enough information (04/15/2009)  Labs Reviewed: K+: 4.3 (12/21/2009) Creat: : 0.9 (12/21/2009)   Chol: 236 (06/23/2008)   HDL: 30.9 (06/23/2008)   LDL: DEL (06/23/2008)   TG: 135 (06/23/2008)  Problem # 5:  THROMBOCYTOPENIA (ICD-287.5) Assessment: Unchanged  Orders: Venipuncture (01093) TLB-Lipid Panel (80061-LIPID) TLB-BMP (Basic Metabolic Panel-BMET) (80048-METABOL) TLB-CBC Platelet - w/Differential (85025-CBCD) TLB-Hepatic/Liver Function Pnl (80076-HEPATIC) TLB-A1C / Hgb A1C (Glycohemoglobin) (83036-A1C) TLB-Udip w/ Micro (81001-URINE) T-HIV Antibody  (Reflex) ([id number])  Problem # 6:  LOW BACK PAIN  (ICD-724.2) Assessment: Unchanged  His updated medication list for this problem includes:    Vicodin Hp 10-660 Mg Tabs (Hydrocodone-acetaminophen) ..... One by mouth qid as needed for pain  Orders: Venipuncture (54270) TLB-Lipid Panel (80061-LIPID) TLB-BMP (Basic Metabolic Panel-BMET) (80048-METABOL) TLB-CBC Platelet - w/Differential (85025-CBCD) TLB-Hepatic/Liver Function Pnl (80076-HEPATIC) TLB-A1C / Hgb A1C (Glycohemoglobin) (83036-A1C) TLB-Udip w/ Micro (81001-URINE) T-HIV Antibody  (Reflex) ([id number])  Problem # 7:  DIAB W/UNS COMP TYPE II/UNS NOT STATED UNCNTRL (ICD-250.90) Assessment: Unchanged  Orders: Venipuncture (17616) TLB-Lipid Panel (80061-LIPID) TLB-BMP (Basic Metabolic Panel-BMET) (80048-METABOL) TLB-CBC Platelet - w/Differential (85025-CBCD) TLB-Hepatic/Liver Function Pnl (80076-HEPATIC) TLB-A1C / Hgb A1C (Glycohemoglobin) (83036-A1C) TLB-Udip w/ Micro (81001-URINE) T-HIV Antibody  (Reflex) [id number])  Labs Reviewed: Creat: 0.9 (12/21/2009)    Reviewed HgBA1c results: 6.3 (12/21/2009)  6.2 (08/14/2009)  Problem # 8:  MIXED HYPERLIPIDEMIA (ICD-272.2) Assessment: Unchanged  His updated medication list for this problem includes:    Crestor 20 Mg Tabs (Rosuvastatin calcium) ..... One by mouth once daily for cholesterol  Orders: Venipuncture (48546) TLB-Lipid Panel (80061-LIPID) TLB-BMP (Basic Metabolic Panel-BMET) (80048-METABOL) TLB-CBC Platelet - w/Differential (85025-CBCD) TLB-Hepatic/Liver Function Pnl (80076-HEPATIC) TLB-A1C / Hgb A1C (Glycohemoglobin) (83036-A1C) TLB-Udip w/ Micro (81001-URINE) T-HIV Antibody  (Reflex) ([id number])  Complete Medication List: 1)  Paxil 30 Mg Tabs (Paroxetine hcl) .... Take 2 tablet by mouth once a day 2)  Trazodone Hcl 100 Mg Tabs (Trazodone hcl) .... Take 3 tab by mouth at bedtime 3)  Coreg 12.5 Mg Tabs (Carvedilol) .Marland Kitchen.. 1 tab two times a day 4)  Xanax 1 Mg Tabs (Alprazolam) .Marland Kitchen.. 1 tab by mouth every 8  hours as needed 5)  Albuterol Sulfate (2.5 Mg/48ml) 0.083% Nebu (Albuterol sulfate) .Marland Kitchen.. 1vial via neb every 4 hrs as needed 6)  Vicodin Hp 10-660 Mg Tabs (Hydrocodone-acetaminophen) .... One by mouth qid as needed  for pain 7)  Spiriva Handihaler 18 Mcg Caps (Tiotropium bromide monohydrate) .Marland Kitchen.. 1 inhalation once daily 8)  Crestor 20 Mg Tabs (Rosuvastatin calcium) .... One by mouth once daily for cholesterol 9)  Lamisil 250 Mg Tabs (Terbinafine hcl) .Marland Kitchen.. 1 tab two times a day 10)  Element Control Normal Liqd (Blood glucose calibration) .... Check meter with each new vial of test strips or irregular readings 11)  Element Test Strp (Glucose blood) .... Test once daily 12)  Lancets Misc (Lancets) .... Test once daily 13)  Lancing Device Misc (Lancet devices) .... Teast once daily 14)  Element Auto System  .... Test once daily 15)  Nexium 40 Mg Cpdr (Esomeprazole magnesium) .... One by mouth once daily  Lipid Assessment/Plan:      Based on NCEP/ATP III, the patient's risk factor category is "history of coronary disease, peripheral vascular disease, cerebrovascular disease, or aortic aneurysm along with either diabetes, current smoker, or LDL > 130 plus HDL < 40 plus triglycerides > 200".  The patient's lipid goals are as follows: Total cholesterol goal is 200; LDL cholesterol goal is 70; HDL cholesterol goal is 40; Triglyceride goal is 150.  His LDL cholesterol goal has not been met.  Secondary causes for hyperlipidemia have been ruled out.  He has been counseled on adjunctive measures for lowering his cholesterol and has been provided with dietary instructions.     Patient Instructions: 1)  Please schedule a follow-up appointment in 4 months. 2)  Tobacco is very bad for your health and your loved ones! You Should stop smoking!. 3)  Stop Smoking Tips: Choose a Quit date. Cut down before the Quit date. decide what you will do as a substitute when you feel the urge to smoke(gum,toothpick,exercise). 4)   It is important that you exercise regularly at least 20 minutes 5 times a week. If you develop chest pain, have severe difficulty breathing, or feel very tired , stop exercising immediately and seek medical attention. 5)  You need to lose weight. Consider a lower calorie diet and regular exercise.  6)  Check your blood sugars regularly. If your readings are usually above 200 or below 70 you should contact our office. 7)  It is important that your Diabetic A1c level is checked every 3 months. 8)  See your eye doctor yearly to check for diabetic eye damage. 9)  Check your feet each night for sore areas, calluses or signs of infection. 10)  Check your Blood Pressure regularly. If it is above 130/80: you should make an appointment. 11)  Most patients (90%) with low back pain will improve with time (2-6 weeks). Keep active but avoid activities that are painful. Apply moist heat and/or ice to lower back several times a day. Prescriptions: VICODIN HP 10-660 MG TABS (HYDROCODONE-ACETAMINOPHEN) One by mouth QID as needed for pain  #120 x 5   Entered and Authorized by:   Etta Grandchild MD   Signed by:   Etta Grandchild MD on 05/07/2010   Method used:   Print then Give to Patient   RxID:   [id number]

## 2010-10-26 NOTE — Progress Notes (Signed)
Summary: lab results  Phone Note Call from Patient Call back at Home Phone [phone]   Caller: Patient Reason for Call: Lab or Test Results Summary of Call: Patient called requesting to know lab results.Marland KitchenMarland KitchenMarland KitchenAlvy Beal Archie CMA  May 10, 2010 9:22 AM   Follow-up for Phone Call        see letter-labs look good Follow-up by: Etta Grandchild MD,  May 10, 2010 10:04 AM  Additional Follow-up for Phone Call Additional follow up Details #1::        Patient notified.Marland KitchenMarland KitchenMarland KitchenAlvy Beal Archie CMA  May 10, 2010 1:06 PM
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
## 2010-10-26 NOTE — Letter (Signed)
Summary: Notice of Denial of Medicare Prescription Drug Coverage  Notice of Denial of Medicare Prescription Drug Coverage   Imported By: Marylou Mccoy 05/17/2010 15:54:34  _____________________________________________________________________  External Attachment:    Type:   Image     Comment:   External Document

## 2010-10-26 NOTE — Progress Notes (Signed)
Summary: [facility]  Phone Note Call from Patient Call back at [phone]   Summary of Call: Patient is requesting refill of test stips.  Initial call taken by: Lamar Sprinkles, CMA,  April 02, 2010 2:19 PM  Follow-up for Phone Call        Pt called stating that [facility] will contact office via fax. Pt has requested a new DM meter through this company and he wanted [facility] to be aware that he checks his blood sugars 2-3 times daily. Follow-up by: Margaret Pyle, CMA,  April 02, 2010 3:34 PM     Appended Document: [facility] Medications Added PROAIR HFA 108 (90 BASE) MCG/ACT AERS (ALBUTEROL SULFATE) 2 puffs every 4-6 hours as needed for SOB       ventolin is no longer covered by pts insurance--ok per MR to change this to proair. med list has been updated with proair---pt stated that he is changing and going with mail order for his meds and the forms have been sent to Dr. Yetta Barre in primary care and they are going to send in his meds once this has been done.  will send a flag to Dr. Yetta Barre to make him aware and see if they will send in the proair for him Randell Loop CMA  April 02, 2010 3:45 PM   Clinical Lists Changes  Medications: Changed medication from VENTOLIN HFA 108 (90 BASE) MCG/ACT AERS (ALBUTEROL SULFATE) Inhale 2 puffs every four hours as needed to PROAIR HFA 108 (90 BASE) MCG/ACT AERS (ALBUTEROL SULFATE) 2 puffs every 4-6 hours as needed for SOB

## 2010-10-26 NOTE — Cardiovascular Report (Signed)
Summary: [facility] Cath Report  [facility] Cath Report   Imported By: Roderic Ovens 11/06/2009 14:05:08  _____________________________________________________________________  External Attachment:    Type:   Image     Comment:   External Document

## 2010-10-26 NOTE — Miscellaneous (Signed)
Summary: Orders Update  Clinical Lists Changes  Orders: Added new Test order of Arterial Duplex Lower Extremity (Arterial Duplex Low) - Signed 

## 2010-10-27 ENCOUNTER — Encounter (HOSPITAL_COMMUNITY): Payer: Self-pay | Admitting: Licensed Clinical Social Worker

## 2010-10-27 ENCOUNTER — Ambulatory Visit (HOSPITAL_COMMUNITY): Admit: 2010-10-27 | Payer: Self-pay | Admitting: Licensed Clinical Social Worker

## 2010-10-28 ENCOUNTER — Ambulatory Visit: Payer: Medicare Other | Admitting: Cardiovascular Disease

## 2010-10-28 NOTE — Letter (Signed)
Summary: [facility]  [address]   [place], [place] [address]   Phone: [phone]  Fax: [phone]    10/20/2010  [facility] [address] [place], [place]  [address]  To Whome it May Concern,    The above named patient was in our office today for a routine follow-up visit.       Sincerely,   Dr. Kalman Shan

## 2010-10-28 NOTE — Progress Notes (Signed)
Summary: lab results  Phone Note Call from Patient Call back at Home Phone [phone]   Caller: Patient Summary of Call: Pt left message on triage A, pt requests a call with his lab results. Initial call taken by: Verdell Face,  October 08, 2010 11:04 AM  Follow-up for Phone Call        everything looks great! Follow-up by: Etta Grandchild MD,  October 08, 2010 11:32 AM  Additional Follow-up for Phone Call Additional follow up Details #1::        pt informed Additional Follow-up by: Brenton Grills CMA Duncan Dull),  October 08, 2010 11:37 AM

## 2010-10-28 NOTE — Progress Notes (Signed)
Summary: Call Report  Phone Note Other Incoming   Caller: [facility] Summary of Call: [facility] Triage Call Report Triage Record Num: [id number] Operator: Albertine Grates Patient Name: [name] Call Date & Time: 09/03/2010 8:24:06PM Patient Phone: [phone] PCP: [place] Patient Gender: Male PCP Fax : [phone] Patient DOB: Feb 25, 1961 Practice Name: [facility] Reason for Call: Has "yeast" in mouth since 12-7 and states has frequently since uses inhalers. Has white patches inside mouth. Has been able to drink fluids. Has been using Listerine. Advised walk in 12-10. Protocol(s) Used: Mouth Lesions Recommended Outcome per Protocol: See Provider within 24 hours Reason for Outcome: Painful pale yellow or white spot(s) on the lining of the mouth, gums, or tongue unresponsive to 72 hours of home care or that are unusually large Care Advice: Rinse mouth with a salt solution (1/2 tsp. salt in 8 ounces [.2 liter] of water) or an antiseptic mouthwash without alcohol 3 or more times a day after eating to relieve symptoms. The solution should not be swallowed.  ~ 12/ Initial call taken by: Margaret Pyle, CMA,  September 06, 2010 8:23 AM

## 2010-10-28 NOTE — Letter (Signed)
Summary: [facility]   Imported By: Lester [place] 10/11/2010 09:27:50  _____________________________________________________________________  External Attachment:    Type:   Image     Comment:   External Document

## 2010-10-28 NOTE — Assessment & Plan Note (Addendum)
Summary: rov/jd   Visit Type:  Follow-up Copy to:  DR, [name] Primary Provider/Referring Provider:  Etta Grandchild [name]  CC:  Pt here for 6 month follow-up. [name]  History of Present Illness: Followup Gold stage 2 COPD (fev1 77%, DLCO 49%, CT 2009 with emphysema, RV 157%), Tobacco Abuse (failed chantix and nicotine gum in past). Hx of thrush with symbicort June 2011. Hx of sore mouth with spiriva but accepts it   October 20, 2010: Followup for above. Last visit was June 2011. Reports stable health. Still smoking. [facility] not [facility]. Wants to quit and seeking helpe. Dyspnea is stable. Cough is stable. BAck on spiriva for copd. Quit symbicort due to thrush. This change was done by PMD. Saw Dr. Maple [name] for allergy eval and recommended astepro as needed per record reviw. No new complaints.     Preventive Screening-Counseling & Management  Alcohol-Tobacco     Alcohol drinks/day: 0     Alcohol Counseling: not indicated; patient does not drink     Smoking Status: current     Smoking Cessation Counseling: yes     Smoke Cessation Stage: contemplative     Packs/Day: 1.0 - 1.5     Year Started: 1974     Cans of tobacco/week: no     Passive Smoke Exposure: no     Tobacco Counseling: to quit use of tobacco products  Comments: [facility]red to [facility], rehab and gave quit line toll free number  5 min counseling  Current Medications (verified): 1)  Paxil 30 Mg Tabs (Paroxetine Hcl) .... Take 2 Tablet By Mouth Once A Day 2)  Trazodone Hcl 100 Mg Tabs (Trazodone Hcl) .... Take 3 Tab By Mouth At Bedtime 3)  Coreg 12.5 Mg Tabs (Carvedilol) .[name].. 1 Tab Two Times A Day 4)  Xanax 1 Mg Tabs (Alprazolam) .[name].. 1 Tab By Mouth Every 8 Hours As Needed 5)  Albuterol Sulfate (2.5 Mg/87ml) 0.083% Nebu (Albuterol Sulfate) .[name].. 1vial Via Neb Every 4 Hrs As Needed 6)  Vicodin Hp 10-660 Mg Tabs (Hydrocodone-Acetaminophen) .... One By Mouth Qid As Needed For Pain 7)  Spiriva  Handihaler 18 Mcg Caps (Tiotropium Bromide Monohydrate) .[name].. 1 Inhalation Once Daily 8)  Crestor 20 Mg Tabs (Rosuvastatin Calcium) .... One By Mouth Once Daily For Cholesterol 9)  Element Control Normal Liqd (Blood Glucose Calibration) .... Check Meter With Each New Vial of Test Strips or Irregular Readings 10)  Element Test  Strp (Glucose Blood) .... Test Once Daily 11)  Lancets  Misc (Lancets) .... Test Once Daily 12)  Lancing Device  Misc (Lancet Devices) .... Teast Once Daily 13)  Element Auto System .... Test Once Daily 14)  Nexium 40 Mg Cpdr (Esomeprazole Magnesium) .... One By Mouth Once Daily 15)  Astepro 0.15 % Soln (Azelastine Hcl) .[name].. 1 To 2 Sprays in Each Nostril Up To Two Times A Day As Needed For Itching and Allergy 16)  Proair Hfa 108 (90 Base) Mcg/act Aers (Albuterol Sulfate) .[name].. 1 Puff Every 4 Hours As Needed  Allergies (verified): 1)  ! Ace Inhibitors 2)  ! Pcn 3)  ! * Blood Thiners 4)  ! Plavix (Clopidogrel Bisulfate)  Past History:  Past medical, surgical, family and social histories (including risk factors) reviewed, and no changes noted (except as noted below).  Past Medical History: Reviewed history from 06/10/2010 and no changes required. 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 Allyson Sabal) left foot podiatric surgery 2011  Family History: Reviewed history from 10/05/2009 and no changes required. Family History of Alcoholism/Addiction Family History of Arthritis Family History Diabetes 3/6 sibs Family History High cholesterol Family History Hypertension Family History of Cardiovascular disorder Family History of Colon Cancer:Mother Family History of Colon Polyps:sisters  Mother alive at 44 with colon cancer,CVA Father deceased MI at age 7  Social History: Reviewed history from 05/04/2010 and no changes required. Disabled due to paranoid/schizophrenia       did work Medical laboratory scientific officer, Financial planner Single Current Smoker 2 ppd, started smoking 1974. Alcohol use-no Drug use-no Regular exercise-no Illicit Drug Use - no  Review of Systems  The patient denies shortness of breath with activity, shortness of breath at rest, productive cough, non-productive cough, coughing up blood, chest pain, irregular heartbeats, acid heartburn, indigestion, loss of appetite, weight change, abdominal pain, difficulty swallowing, sore throat, tooth/dental problems, headaches, nasal congestion/difficulty breathing through nose, sneezing, itching, ear ache, anxiety, depression, hand/feet swelling, joint stiffness or pain, rash, change in color of mucus, and fever.    Vital Signs:  Patient profile:   50 year old male Height:      75 inches Weight:      229 pounds BMI:     28.73 O2 Sat:      96 % on Room air Temp:     97.9 degrees F oral Pulse rate:   70 / minute BP sitting:   104 / 66  (right arm) Cuff size:   regular  Vitals Entered By: Carron Curie CMA (October 20, 2010 9:00 AM)  O2 Flow:  Room air CC: Pt here for 6 month  follow-up.    Physical Exam  General:  alert, well-developed, well-nourished, and well-hydrated.   Head:  normocephalic and atraumatic Eyes:  PERRLA/EOM intact; conjunctiva and sclera clear Ears:  R ear normal and L ear normal.   Nose:  no deformity, discharge, inflammation, or lesions Mouth:  no ulcers Neck:  scar of old tracheostomy + Chest Wall:  No deformities, masses, tenderness or gynecomastia noted. Lungs:  normal respiratory effort, no intercostal retractions, no accessory muscle use, and normal breath sounds.   Heart:  Normal rate and regular rhythm. S1 and S2 normal without gallop, murmur, click, rub or other extra sounds. Abdomen:  not examined Msk:  normal ROM, no joint tenderness, no joint swelling, no joint warmth, and no redness over joints.   Pulses:  R femoral decreased, R popliteal decreased, R posterior tibial decreased, R dorsalis pedis decreased, L femoral decreased, L popliteal decreased, L posterior tibial decreased, and L dorsalis pedis decreased.   Extremities:  no clubbing, cyanosis, edema, or deformity noted Neurologic:  non-focal Skin:  turgor normal, color normal, no rashes, no suspicious lesions, no ecchymoses, no petechiae, and no purpura.   Cervical Nodes:  no anterior  cervical adenopathy and no posterior cervical adenopathy.   Axillary Nodes:  no R axillary adenopathy and no L axillary adenopathy.   Psych:  anxious, a bit tangential but easily redirected.    Impression & Recommendations:  Problem # 1:  TOBACCO ABUSE (ICD-305.1) Assessment Unchanged  5 min counselling to quit. See smoking section for details  Orders: Est. Patient Level III (78295) Tobacco use cessation intermediate 3-10 minutes (62130)  Problem # 2:  COPD (ICD-496) Assessment: Unchanged stabl disease  plan cointnue spiriva refer pulm rehab at followup check alpha 1 will ask Dr. Clifton James his cardiologist if he can come off coreg rov 6 months  Medications Added to  Medication List This Visit: 1)  Proair Hfa 108 (90 Base) Mcg/act Aers (Albuterol sulfate) .[name].. 1 puff every 4 hours as needed  Other Orders: Pulmonary Referral (Pulmonary) Prescription Created Electronically [phone])  Patient Instructions: 1)  Quit smoking 2)   - call [facility] 3)   - call 1-800-QUIT-NOW or [phone] 4)  continue spiriva 1 puff daily 5)  use albuterol nebulizer as needed 6)  do not take atrovent nebulizer while you take albuterol 7)  please attend pulmonary rehab 8)  ROV 6 months Prescriptions: PROAIR HFA 108 (90 BASE) MCG/ACT AERS (ALBUTEROL SULFATE) 1 puff every 4 hours as needed  #1 x 2   Entered and Authorized by:   Kalman Shan [name]   Signed by:   Kalman Shan [name] on 10/20/2010   Method used:   Electronically to        [facility] (retail)       [address]       [place], [place]  [id number]       Ph: [phone] or [phone]       Fax: [phone]   RxID:   [id number] SPIRIVA HANDIHALER 18 MCG CAPS (TIOTROPIUM BROMIDE MONOHYDRATE) 1 inhalation once daily  #1 x 6   Entered and Authorized by:   Kalman Shan [name]   Signed by:   Kalman Shan [name] on 10/20/2010   Method used:   Electronically to        [facility] (retail)       [address]       [place], [place]  [address]       Ph: [phone] or [phone]       Fax: [phone]   RxID:   [id number] ALBUTEROL SULFATE (2.5 MG/3ML) 0.083% NEBU (ALBUTEROL SULFATE) 1vial via neb every 4 hrs as needed  #120 x 2   Entered and Authorized by:   Kalman Shan [name]   Signed by:   Kalman Shan [name] on 10/20/2010   Method used:   Electronically to        [facility] (retail)       [address]       [place], [place]  [id number]       Ph: [phone] or [phone]       Fax: [phone]   RxID:   [id number]   Appended Document: rov/jd Hyacinth Meeker can he come off his coreg  ? He has copd  Thanks  MR  Appended  Document: rov/jd Etoile Looman, He is on the Coreg for cardiomyopathy but if he is having issues with COPD, it can be stopped. Chris  Appended Document: rov/jd will address dc coreg at next ov

## 2010-10-28 NOTE — Progress Notes (Signed)
Summary: return call  Phone Note Call from Patient Call back at Work Phone [phone]   Caller: Patient Summary of Call: Patient called asking for MD only to call back, would not give any details.Alvy Beal Archie CMA  September 08, 2010 11:56 AM   Follow-up for Phone Call        spoke to pt. and he wants [place] to help a friend with Christmas toys   Follow-up by: Etta Grandchild MD,  September 08, 2010 2:03 PM

## 2010-10-28 NOTE — Assessment & Plan Note (Signed)
Summary: f/u appt/cd   Vital Signs:  Patient profile:   50 year old male Height:      75 inches Weight:      223 pounds BMI:     27.97 O2 Sat:      96 % on Room air Temp:     97.9 degrees F oral Pulse rate:   88 / minute Pulse rhythm:   regular Resp:     16 per minute BP sitting:   108 / 70  (left arm) Cuff size:   large  Vitals Entered By: Rock Nephew CMA (October 05, 2010 10:04 AM)  Nutrition Counseling: Patient's BMI is greater than 25 and therefore counseled on weight management options.  O2 Flow:  Room air CC: Dysuria, Hypertension Management, Lipid Management   Primary Care Provider:  Etta Grandchild MD  CC:  Dysuria, Hypertension Management, and Lipid Management.  History of Present Illness: He returns for routine f/up but he also has some symptoms with an occasional stinging on urination and very low grade fever, he wants a full STI panel as well.  Hypertension History:      He denies headache, chest pain, palpitations, dyspnea with exertion, orthopnea, PND, peripheral edema, visual symptoms, neurologic problems, syncope, and side effects from treatment.  He notes no problems with any antihypertensive medication side effects.        Positive major cardiovascular risk factors include male age 64 years old or older, diabetes, hyperlipidemia, hypertension, and current tobacco user.  Negative major cardiovascular risk factors include negative family history for ischemic heart disease.        Positive history for target organ damage include cardiac end organ damage (either CHF or LVH) and peripheral vascular disease.  Further assessment for target organ damage reveals no history of ASHD, stroke/TIA, renal insufficiency, or hypertensive retinopathy.    Lipid Management History:      Positive NCEP/ATP III risk factors include male age 65 years old or older, diabetes, HDL cholesterol less than 40, current tobacco user, hypertension, and peripheral vascular disease.  Negative  NCEP/ATP III risk factors include no family history for ischemic heart disease, no ASHD (atherosclerotic heart disease), no prior stroke/TIA, and no history of aortic aneurysm.        The patient states that he knows about the "Therapeutic Lifestyle Change" diet.  His compliance with the TLC diet is poor.  The patient expresses understanding of adjunctive measures for cholesterol lowering.  Adjunctive measures started by the patient include fiber, ASA, limit alcohol consumpton, and weight reduction.  He expresses no side effects from his lipid-lowering medication.  The patient denies any symptoms to suggest myopathy or liver disease.     Preventive Screening-Counseling & Management  Alcohol-Tobacco     Smoking Cessation Counseling: yes  Diabetic Eye Exam  Procedure date:  10/04/2010  Findings:      No diabetic retinopathy was detected done 10/04/2010 w/ [facility] Ophthalmology Dr. Randon Goldsmith  Current Medications (verified): 1)  Paxil 30 Mg Tabs (Paroxetine Hcl) .... Take 2 Tablet By Mouth Once A Day 2)  Trazodone Hcl 100 Mg Tabs (Trazodone Hcl) .... Take 3 Tab By Mouth At Bedtime 3)  Coreg 12.5 Mg Tabs (Carvedilol) .Marland Kitchen.. 1 Tab Two Times A Day 4)  Xanax 1 Mg Tabs (Alprazolam) .Marland Kitchen.. 1 Tab By Mouth Every 8 Hours As Needed 5)  Albuterol Sulfate (2.5 Mg/66ml) 0.083% Nebu (Albuterol Sulfate) .Marland Kitchen.. 1vial Via Neb Every 4 Hrs As Needed 6)  Vicodin Hp 10-660 Mg Tabs (  Hydrocodone-Acetaminophen) .... One By Mouth Qid As Needed For Pain 7)  Spiriva Handihaler 18 Mcg Caps (Tiotropium Bromide Monohydrate) .Marland Kitchen.. 1 Inhalation Once Daily 8)  Crestor 20 Mg Tabs (Rosuvastatin Calcium) .... One By Mouth Once Daily For Cholesterol 9)  Element Control Normal Liqd (Blood Glucose Calibration) .... Check Meter With Each New Vial of Test Strips or Irregular Readings 10)  Element Test  Strp (Glucose Blood) .... Test Once Daily 11)  Lancets  Misc (Lancets) .... Test Once Daily 12)  Lancing Device  Misc (Lancet Devices) ....  Teast Once Daily 13)  Element Auto System .... Test Once Daily 14)  Nexium 40 Mg Cpdr (Esomeprazole Magnesium) .... One By Mouth Once Daily 15)  Astepro 0.15 % Soln (Azelastine Hcl) .Marland Kitchen.. 1 To 2 Sprays in Each Nostril Up To Two Times A Day As Needed For Itching and Allergy 16)  Viagra 50 Mg Tabs (Sildenafil Citrate) .... As Needed  Allergies (verified): 1)  ! Ace Inhibitors 2)  ! Pcn 3)  ! * Blood Thiners 4)  ! 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 Allyson Sabal) left foot podiatric surgery 2011  Family History: Last updated: 10/05/2009 Family History of Alcoholism/Addiction Family History of Arthritis Family History Diabetes 3/6 sibs Family History High cholesterol Family History Hypertension Family History of Cardiovascular disorder Family History of Colon Cancer:Mother Family History of Colon Polyps:sisters  Mother alive at 40 with colon cancer,CVA Father deceased MI at age 20  Social History: Last updated:  05/04/2010 Disabled due to paranoid/schizophrenia       did work Medical laboratory scientific officer, Financial planner Single Current Smoker 1ppd, started smoking 1974. Alcohol use-no Drug use-no Regular exercise-no Illicit Drug Use - no  Risk Factors: Alcohol Use: 0 (08/05/2010) Exercise: yes (06/23/2008)  Risk Factors: Smoking Status: current (08/05/2010) Packs/Day: 1.0 - 1.5 (08/05/2010) Cans of tobacco/wk: no (08/05/2010) Passive Smoke Exposure: no (08/05/2010)  Family History: Reviewed history from 10/05/2009 and no changes required. Family History of Alcoholism/Addiction Family History of Arthritis Family History Diabetes 3/6 sibs Family History High cholesterol Family History Hypertension Family History of Cardiovascular disorder Family History of Colon Cancer:Mother Family History of Colon Polyps:sisters  Mother alive at 86 with colon cancer,CVA Father deceased MI at age 57  Social History: Reviewed history from 05/04/2010 and no changes required. 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 Alcohol use-no Drug use-no Regular exercise-no Illicit Drug Use - no  Review of Systems       The patient complains of fever.  The patient denies anorexia, weight loss, weight gain, decreased hearing, hoarseness, chest pain, syncope, dyspnea on exertion, peripheral edema,  prolonged cough, headaches, hemoptysis, abdominal pain, melena, hematochezia, severe indigestion/heartburn, hematuria, genital sores, suspicious skin lesions, difficulty walking, depression, abnormal bleeding, enlarged lymph nodes, and angioedema.   General:  Complains of fatigue; denies chills, fever, loss of appetite, malaise, sleep disorder, sweats, weakness, and weight loss. GU:  Complains of dysuria; denies discharge, genital sores, hematuria, incontinence, nocturia, urinary frequency, and urinary hesitancy. Endo:   Denies cold intolerance, excessive hunger, excessive thirst, excessive urination, heat intolerance, polyuria, and weight change.  Physical Exam  General:  alert, well-developed, well-nourished, and well-hydrated.   Head:  normocephalic, atraumatic, no abnormalities observed, and no abnormalities palpated.   Eyes:  vision grossly intact, pupils equal, pupils round, and pupils reactive to light.   Ears:  R ear normal and L ear normal.   Mouth:  good dentition and pharynx pink and moist.   Neck:  supple, full ROM, no masses, no thyromegaly, no thyroid nodules or tenderness, no JVD, normal carotid upstroke, and no carotid bruits.   Lungs:  normal respiratory effort, no intercostal retractions, no accessory muscle use, normal breath sounds, no dullness, no fremitus, no crackles, and no wheezes.   Heart:  normal rate, regular rhythm, no murmur, no gallop, no rub, and no JVD.   Abdomen:  soft, non-tender, normal bowel sounds, no distention, no masses, no guarding, no rigidity, no rebound tenderness, no abdominal hernia, no inguinal hernia, no hepatomegaly, and no splenomegaly.   Msk:  normal ROM, no joint tenderness, no joint swelling, no joint warmth, no redness over joints, no joint deformities, no joint instability, and no crepitation.   Pulses:  R and L carotid,radial,femoral,dorsalis pedis and posterior tibial pulses are full and equal bilaterally Extremities:  No clubbing, cyanosis, edema, or deformity noted with normal full range of motion of all joints.   Neurologic:  No cranial nerve deficits noted. Station and gait are normal. Plantar reflexes are down-going bilaterally. DTRs are symmetrical throughout. Sensory, motor and coordinative functions appear intact. Skin:  turgor normal, color normal, no rashes, no suspicious lesions, no ecchymoses, no petechiae, and no purpura.   Cervical Nodes:  no anterior cervical adenopathy and no posterior cervical adenopathy.   Axillary Nodes:  no R axillary  adenopathy and no L axillary adenopathy.   Psych:  Cognition and judgment appear intact. Alert and cooperative with normal attention span and concentration. No apparent delusions, illusions, hallucinations  Diabetes Management Exam:    Foot Exam (with socks and/or shoes not present):       Sensory-Pinprick/Light touch:          Left medial foot (L-4): normal          Left dorsal foot (L-5): normal          Left lateral foot (S-1): normal          Right medial foot (L-4): normal          Right dorsal foot (L-5): normal          Right lateral foot (S-1): normal       Sensory-Monofilament:          Left foot: normal          Right foot: normal       Inspection:          Left foot: normal          Right foot: normal       Nails:          Left foot: normal  Right foot: normal   Impression & Recommendations:  Problem # 1:  FEVER (ICD-780.60) Assessment New  Orders: Venipuncture (81191) TLB-Lipid Panel (80061-LIPID) TLB-BMP (Basic Metabolic Panel-BMET) (80048-METABOL) TLB-CBC Platelet - w/Differential (85025-CBCD) TLB-Hepatic/Liver Function Pnl (80076-HEPATIC) TLB-TSH (Thyroid Stimulating Hormone) (84443-TSH) TLB-A1C / Hgb A1C (Glycohemoglobin) (83036-A1C) TLB-Udip w/ Micro (81001-URINE) T-HIV Antibody  (Reflex) [id number]) T-RPR (Syphilis) [phone]) T-GC Probe, urine (29528-41324) T-Chlamydia  Probe, urine [phone])  Problem # 2:  OTHER URETHRITIS (UYQ-034.74) Assessment: New  Orders: Venipuncture ([id number]) TLB-Lipid Panel (80061-LIPID) TLB-BMP (Basic Metabolic Panel-BMET) (80048-METABOL) TLB-CBC Platelet - w/Differential (85025-CBCD) TLB-Hepatic/Liver Function Pnl (80076-HEPATIC) TLB-TSH (Thyroid Stimulating Hormone) (84443-TSH) TLB-A1C / Hgb A1C (Glycohemoglobin) (83036-A1C) TLB-Udip w/ Micro (81001-URINE) T-HIV Antibody  (Reflex) [id number]) T-RPR (Syphilis) (51884-16606) T-GC Probe, urine (30160-10932) T-Chlamydia  Probe, urine  (35573-22025)  Problem # 3:  COUGH (ICD-786.2) Assessment: Improved  Problem # 4:  TOBACCO ABUSE (ICD-305.1) Assessment: Unchanged  Encouraged smoking cessation and discussed different methods for smoking cessation.   Problem # 5:  HYPERTENSION, BENIGN (ICD-401.1) Assessment: Improved  His updated medication list for this problem includes:    Coreg 12.5 Mg Tabs (Carvedilol) .Marland Kitchen... 1 tab two times a day  Orders: Venipuncture (42706) TLB-Lipid Panel (80061-LIPID) TLB-BMP (Basic Metabolic Panel-BMET) (80048-METABOL) TLB-CBC Platelet - w/Differential (85025-CBCD) TLB-Hepatic/Liver Function Pnl (80076-HEPATIC) TLB-TSH (Thyroid Stimulating Hormone) (84443-TSH) TLB-A1C / Hgb A1C (Glycohemoglobin) (83036-A1C) TLB-Udip w/ Micro (81001-URINE) T-HIV Antibody  (Reflex) [id number]) T-RPR (Syphilis) (76160-73710)  BP today: 108/70 Prior BP: 118/76 (08/05/2010)  Prior 10 Yr Risk Heart Disease: 14 % (08/05/2010)  Labs Reviewed: K+: 4.3 (05/07/2010) Creat: : 0.8 (05/07/2010)   Chol: 99 (05/07/2010)   HDL: 26.60 (05/07/2010)   LDL: 58 (05/07/2010)   TG: 74.0 (05/07/2010)  Problem # 6:  SEXUAL ACTIVITY, HIGH RISK (ICD-V69.2) Assessment: Unchanged  Problem # 7:  DIAB W/UNS COMP TYPE II/UNS NOT STATED UNCNTRL (ICD-250.90) Assessment: Unchanged  Orders: Venipuncture (62694) TLB-Lipid Panel (80061-LIPID) TLB-BMP (Basic Metabolic Panel-BMET) (80048-METABOL) TLB-CBC Platelet - w/Differential (85025-CBCD) TLB-Hepatic/Liver Function Pnl (80076-HEPATIC) TLB-TSH (Thyroid Stimulating Hormone) (84443-TSH) TLB-A1C / Hgb A1C (Glycohemoglobin) (83036-A1C) TLB-Udip w/ Micro (81001-URINE) T-HIV Antibody  (Reflex) [id number]) T-RPR (Syphilis) [phone])  Labs Reviewed: Creat: 0.8 (05/07/2010)     Last Eye Exam: No diabetic retinopathy was detected done 10/04/2010 w/ [facility] Ophthalmology Dr. Randon Goldsmith (10/04/2010) Reviewed HgBA1c results: 6.2 (05/07/2010)  6.3 (12/21/2009)  Problem # 8:   MIXED HYPERLIPIDEMIA (ICD-272.2) Assessment: Unchanged  His updated medication list for this problem includes:    Crestor 20 Mg Tabs (Rosuvastatin calcium) ..... One by mouth once daily for cholesterol  Orders: Venipuncture (71696) TLB-Lipid Panel (80061-LIPID) TLB-BMP (Basic Metabolic Panel-BMET) (80048-METABOL) TLB-CBC Platelet - w/Differential (85025-CBCD) TLB-Hepatic/Liver Function Pnl (80076-HEPATIC) TLB-TSH (Thyroid Stimulating Hormone) (84443-TSH) TLB-A1C / Hgb A1C (Glycohemoglobin) (83036-A1C) TLB-Udip w/ Micro (81001-URINE) T-HIV Antibody  (Reflex) [id number]) T-RPR (Syphilis) [phone])  Labs Reviewed: SGOT: 18 (05/07/2010)   SGPT: 24 (05/07/2010)  Lipid Goals: Chol Goal: 200 (03/18/2010)   HDL Goal: 40 (03/18/2010)   LDL Goal: 70 (03/18/2010)   TG Goal: 150 (03/18/2010)  Prior 10 Yr Risk Heart Disease: 14 % (08/05/2010)   HDL:26.60 (05/07/2010), 30.9 (06/23/2008)  LDL:58 (05/07/2010), DEL (24/23/5361)  Chol:99 (05/07/2010), 236 (06/23/2008)  Trig:74.0 (05/07/2010), 135 (06/23/2008)  Complete Medication List: 1)  Paxil 30 Mg Tabs (Paroxetine hcl) .... Take 2 tablet by mouth once a day 2)  Trazodone Hcl 100 Mg Tabs (Trazodone hcl) .... Take 3 tab by mouth at bedtime 3)  Coreg 12.5 Mg Tabs (Carvedilol) .Marland Kitchen.. 1 tab two times a day 4)  Xanax  1 Mg Tabs (Alprazolam) .Marland Kitchen.. 1 tab by mouth every 8 hours as needed 5)  Albuterol Sulfate (2.5 Mg/15ml) 0.083% Nebu (Albuterol sulfate) .Marland Kitchen.. 1vial via neb every 4 hrs as needed 6)  Vicodin Hp 10-660 Mg Tabs (Hydrocodone-acetaminophen) .... One by mouth qid as needed for pain 7)  Spiriva Handihaler 18 Mcg Caps (Tiotropium bromide monohydrate) .Marland Kitchen.. 1 inhalation once daily 8)  Crestor 20 Mg Tabs (Rosuvastatin calcium) .... One by mouth once daily for cholesterol 9)  Element Control Normal Liqd (Blood glucose calibration) .... Check meter with each new vial of test strips or irregular readings 10)  Element Test Strp (Glucose blood) ....  Test once daily 11)  Lancets Misc (Lancets) .... Test once daily 12)  Lancing Device Misc (Lancet devices) .... Teast once daily 13)  Element Auto System  .... Test once daily 14)  Nexium 40 Mg Cpdr (Esomeprazole magnesium) .... One by mouth once daily 15)  Astepro 0.15 % Soln (Azelastine hcl) .Marland Kitchen.. 1 to 2 sprays in each nostril up to two times a day as needed for itching and allergy 16)  Viagra 50 Mg Tabs (Sildenafil citrate) .... As needed  Hypertension Assessment/Plan:      The patient's hypertensive risk group is category C: Target organ damage and/or diabetes.  His calculated 10 year risk of coronary heart disease is 14 %.  Today's blood pressure is 108/70.  His blood pressure goal is < 130/80.  Lipid Assessment/Plan:      Based on NCEP/ATP III, the patient's risk factor category is "history of coronary disease, peripheral vascular disease, cerebrovascular disease, or aortic aneurysm along with either diabetes, current smoker, or LDL > 130 plus HDL < 40 plus triglycerides > 200".  The patient's lipid goals are as follows: Total cholesterol goal is 200; LDL cholesterol goal is 70; HDL cholesterol goal is 40; Triglyceride goal is 150.  His LDL cholesterol goal has not been met.  Secondary causes for hyperlipidemia have been ruled out.  He has been counseled on adjunctive measures for lowering his cholesterol and has been provided with dietary instructions.     Patient Instructions: 1)  Please schedule a follow-up appointment in 2 months. 2)  Tobacco is very bad for your health and your loved ones! You Should stop smoking!. 3)  Stop Smoking Tips: Choose a Quit date. Cut down before the Quit date. decide what you will do as a substitute when you feel the urge to smoke(gum,toothpick,exercise). 4)  It is important that you exercise regularly at least 20 minutes 5 times a week. If you develop chest pain, have severe difficulty breathing, or feel very tired , stop exercising immediately and seek  medical attention. 5)  You need to lose weight. Consider a lower calorie diet and regular exercise.  6)  If you could be exposed to sexually transmitted diseases, you should use a condom. 7)  Check your Blood Pressure regularly. If it is above 130/80: you should make an appointment.   Orders Added: 1)  Venipuncture [36415] 2)  TLB-Lipid Panel [80061-LIPID] 3)  TLB-BMP (Basic Metabolic Panel-BMET) [80048-METABOL] 4)  TLB-CBC Platelet - w/Differential [85025-CBCD] 5)  TLB-Hepatic/Liver Function Pnl [80076-HEPATIC] 6)  TLB-TSH (Thyroid Stimulating Hormone) [84443-TSH] 7)  TLB-A1C / Hgb A1C (Glycohemoglobin) [83036-A1C] 8)  TLB-Udip w/ Micro [81001-URINE] 9)  T-HIV Antibody  (Reflex) [[id number]] 10)  T-RPR (Syphilis) [09811-91478] 11)  T-GC Probe, urine [id number] 12)  T-Chlamydia  Probe, urine [[id number]] 13)  Est. Patient Level III [28413]

## 2010-10-28 NOTE — Miscellaneous (Signed)
Summary: Orders Update  Clinical Lists Changes  Orders: Added new Test order of Arterial Duplex Lower Extremity (Arterial Duplex Low) - Signed 

## 2010-11-03 ENCOUNTER — Encounter (HOSPITAL_COMMUNITY): Payer: Self-pay | Admitting: Licensed Clinical Social Worker

## 2010-11-03 NOTE — Progress Notes (Signed)
Summary: xanax  Phone Note Call from Patient   Caller: [phone] Summary of Call: Patient called requesting rx for 2mg  of xanax #10. He states that his mother is being d/c from hospital today and he is taking her home today to die. He feels that this will help him to get through. Please advise.Marland KitchenMarland KitchenAlvy Beal Archie CMA  October 25, 2010 4:13 PM  Initial call taken by: Etta Grandchild MD,  October 25, 2010 5:09 PM  Follow-up for Phone Call        his usual xanax dose should be fine Follow-up by: Etta Grandchild MD,  October 25, 2010 5:10 PM  Additional Follow-up for Phone Call Additional follow up Details #1::        Returned call to patient//[email or web address] Beal Archie CMA  October 26, 2010 8:09 AM

## 2010-11-07 IMAGING — CR DG CHEST 2V
3 series · 3 of 3 positions shown · non-contrast
Comparison: 07/27/2009.

CLINICAL DATA: Cough.

CHEST - 2 VIEW

[view not recorded (1 of 3)]
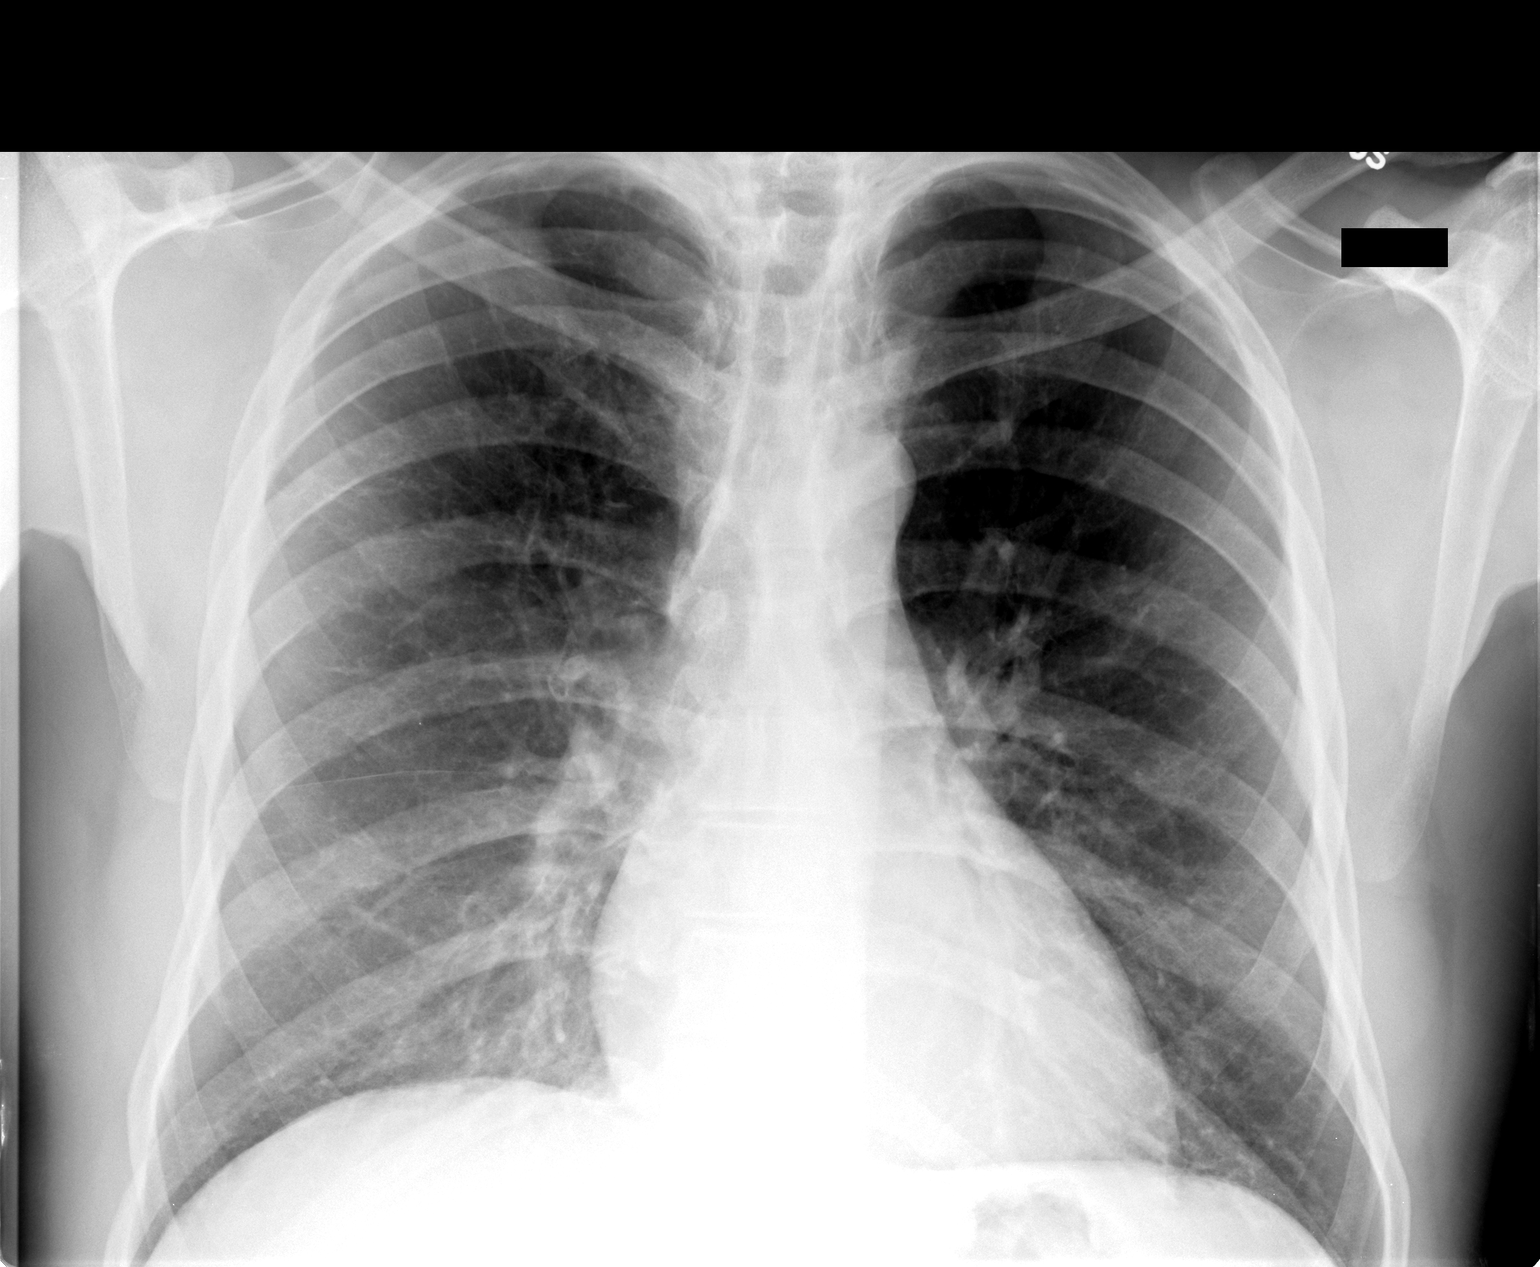

[view not recorded (2 of 3)]
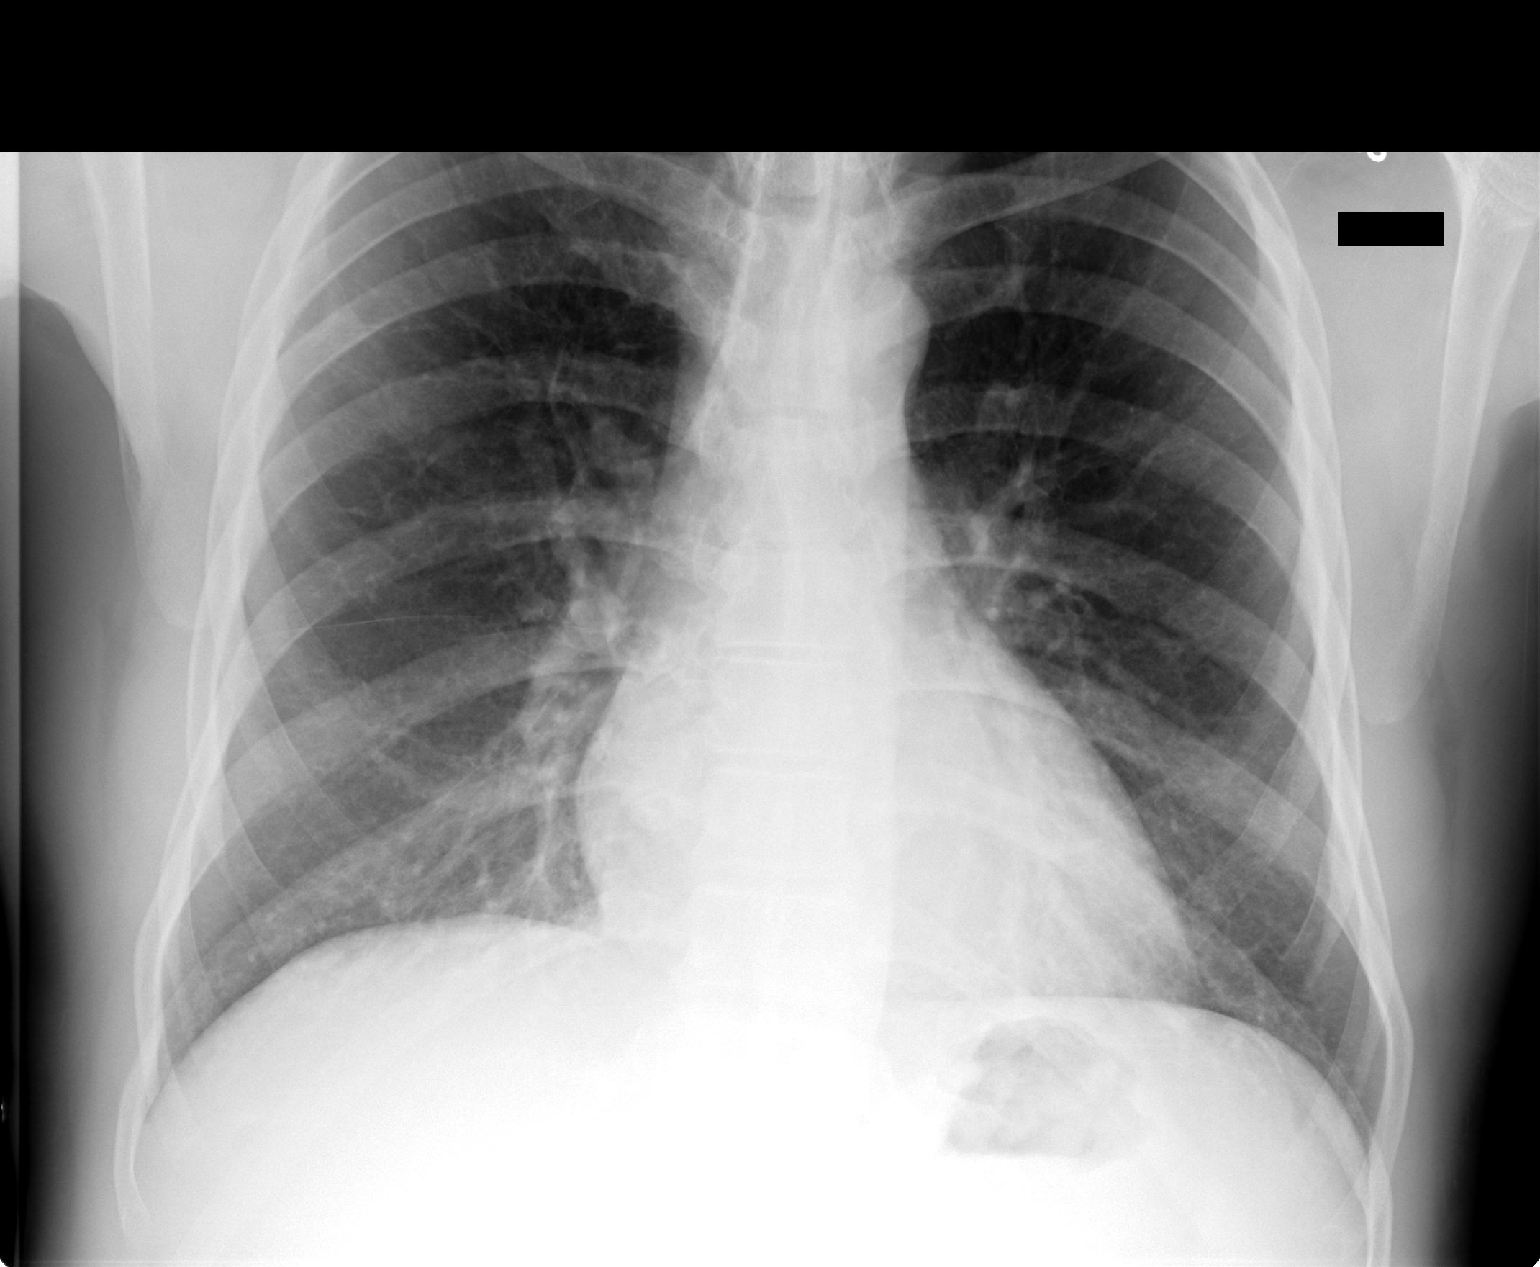

[view not recorded (3 of 3)]
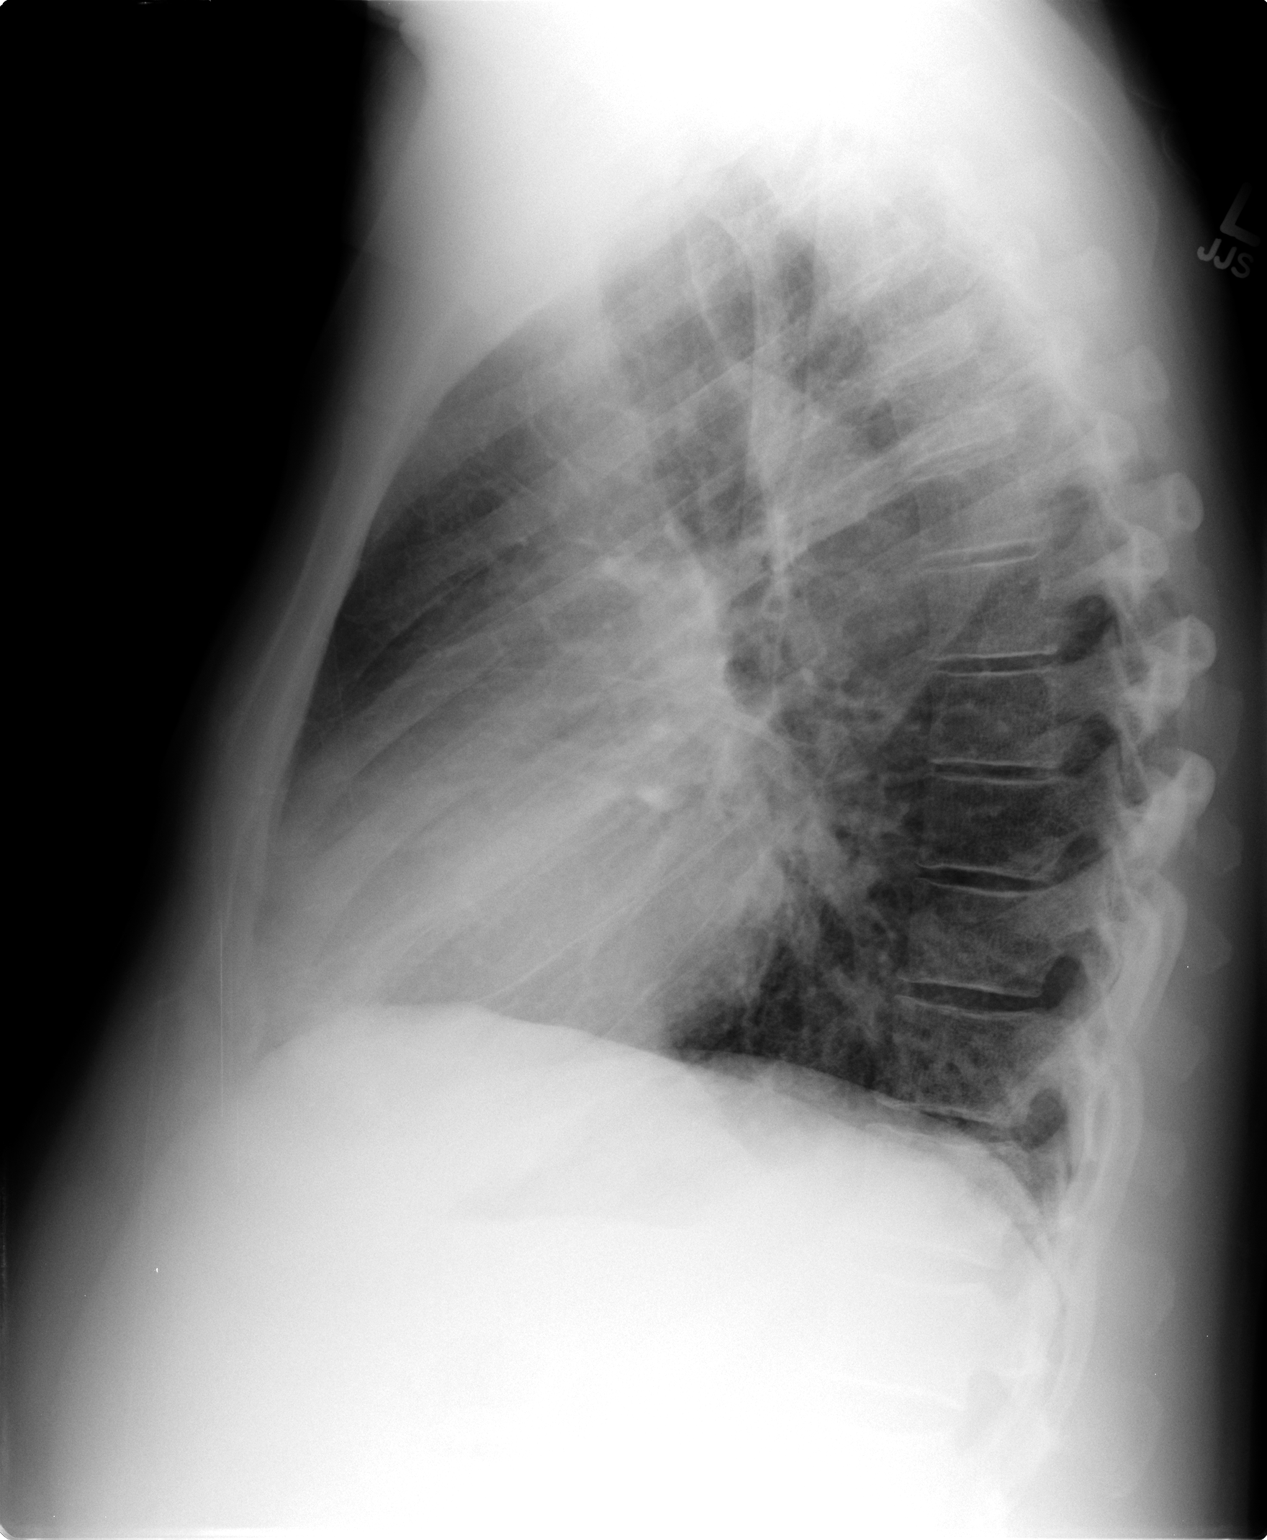

[3 of 3 positions shown; findings below may reference images not displayed]

FINDINGS: Heart size is normal and the vascularity is normal.  The
lungs are clear without infiltrate or effusion.  No interval
change.
IMPRESSION: No acute cardiopulmonary disease.

## 2010-11-10 ENCOUNTER — Encounter (HOSPITAL_COMMUNITY): Payer: Self-pay | Admitting: Licensed Clinical Social Worker

## 2010-11-15 ENCOUNTER — Other Ambulatory Visit: Payer: Self-pay | Admitting: Internal Medicine

## 2010-11-15 ENCOUNTER — Ambulatory Visit (INDEPENDENT_AMBULATORY_CARE_PROVIDER_SITE_OTHER)
Admission: RE | Admit: 2010-11-15 | Discharge: 2010-11-15 | Disposition: A | Discharge status: Home / Self Care | Payer: Medicare Other | Source: Ambulatory Visit | Attending: Internal Medicine | Admitting: Internal Medicine

## 2010-11-15 ENCOUNTER — Ambulatory Visit (INDEPENDENT_AMBULATORY_CARE_PROVIDER_SITE_OTHER): Payer: Medicare Other | Admitting: Internal Medicine

## 2010-11-15 ENCOUNTER — Encounter (HOSPITAL_COMMUNITY): Payer: Medicare Other | Admitting: Licensed Clinical Social Worker

## 2010-11-15 ENCOUNTER — Encounter: Payer: Self-pay | Admitting: Internal Medicine

## 2010-11-15 DIAGNOSIS — R059 Cough, unspecified: Secondary | ICD-10-CM

## 2010-11-15 DIAGNOSIS — R05 Cough: Secondary | ICD-10-CM

## 2010-11-15 DIAGNOSIS — J449 Chronic obstructive pulmonary disease, unspecified: Secondary | ICD-10-CM

## 2010-11-15 DIAGNOSIS — J209 Acute bronchitis, unspecified: Secondary | ICD-10-CM

## 2010-11-17 ENCOUNTER — Encounter (HOSPITAL_COMMUNITY): Payer: Self-pay | Admitting: Licensed Clinical Social Worker

## 2010-11-19 ENCOUNTER — Encounter (HOSPITAL_COMMUNITY): Payer: Medicare Other | Admitting: Licensed Clinical Social Worker

## 2010-11-23 NOTE — Assessment & Plan Note (Signed)
Summary: CONGESTION  PER  PT   STC   Vital Signs:  Patient profile:   50 year old male Height:      75 inches Weight:      221 pounds BMI:     27.72 O2 Sat:      94 % on Room air Temp:     97.7 degrees F oral Pulse rate:   92 / minute Pulse rhythm:   regular Resp:     16 per minute BP sitting:   138 / 80  (left arm) Cuff size:   large  Vitals Entered By: Rock Nephew CMA (November 15, 2010 10:05 AM)  Nutrition Counseling: Patient's BMI is greater than 25 and therefore counseled on weight management options.  O2 Flow:  Room air CC: Patient c/o congestion w/ non productive cough, URI symptoms Is Patient Diabetic? Yes Did you bring your meter with you today? No Pain Assessment Patient in pain? no       Does patient need assistance? Functional Status Self care Ambulation Normal   Primary Care Provider:  Etta Grandchild MD  CC:  Patient c/o congestion w/ non productive cough and URI symptoms.  History of Present Illness:  URI Symptoms      This is a 50 year old man who presents with URI symptoms.  The symptoms began 2 weeks ago.  The severity is described as mild.  The patient reports nasal congestion, purulent nasal discharge, and productive cough, but denies clear nasal discharge, sore throat, dry cough, earache, and sick contacts.  The patient denies fever, stiff neck, dyspnea, wheezing, rash, vomiting, diarrhea, use of an antipyretic, and response to antipyretic.  The patient denies itchy throat, sneezing, headache, muscle aches, and severe fatigue.  Risk factors for Strep sinusitis include unilateral facial pain and unilateral nasal discharge.  The patient denies the following risk factors for Strep sinusitis: poor response to decongestant, double sickening, tooth pain, Strep exposure, tender adenopathy, and absence of cough.    Preventive Screening-Counseling & Management  Alcohol-Tobacco     Alcohol drinks/day: 0     Alcohol Counseling: not indicated; patient does  not drink     Smoking Status: current     Smoking Cessation Counseling: yes     Smoke Cessation Stage: precontemplative     Packs/Day: 1.0 - 1.5     Year Started: 1974     Cans of tobacco/week: no     Passive Smoke Exposure: no     Tobacco Counseling: to quit use of tobacco products  Current Medications (verified): 1)  Paxil 30 Mg Tabs (Paroxetine Hcl) .... Take 2 Tablet By Mouth Once A Day 2)  Trazodone Hcl 100 Mg Tabs (Trazodone Hcl) .... Take 3 Tab By Mouth At Bedtime 3)  Coreg 12.5 Mg Tabs (Carvedilol) .Marland Kitchen.. 1 Tab Two Times A Day 4)  Xanax 1 Mg Tabs (Alprazolam) .Marland Kitchen.. 1 Tab By Mouth Every 8 Hours As Needed 5)  Albuterol Sulfate (2.5 Mg/32ml) 0.083% Nebu (Albuterol Sulfate) .Marland Kitchen.. 1vial Via Neb Every 4 Hrs As Needed 6)  Vicodin Hp 10-660 Mg Tabs (Hydrocodone-Acetaminophen) .... One By Mouth Qid As Needed For Pain 7)  Spiriva Handihaler 18 Mcg Caps (Tiotropium Bromide Monohydrate) .Marland Kitchen.. 1 Inhalation Once Daily 8)  Crestor 20 Mg Tabs (Rosuvastatin Calcium) .... One By Mouth Once Daily For Cholesterol 9)  Element Control Normal Liqd (Blood Glucose Calibration) .... Check Meter With Each New Vial of Test Strips or Irregular Readings 10)  Element Test  Strp (Glucose  Blood) .... Test Once Daily 11)  Lancets  Misc (Lancets) .... Test Once Daily 12)  Lancing Device  Misc (Lancet Devices) .... Teast Once Daily 13)  Element Auto System .... Test Once Daily 14)  Nexium 40 Mg Cpdr (Esomeprazole Magnesium) .... One By Mouth Once Daily 15)  Astepro 0.15 % Soln (Azelastine Hcl) .Marland Kitchen.. 1 To 2 Sprays in Each Nostril Up To Two Times A Day As Needed For Itching and Allergy 16)  Proair Hfa 108 (90 Base) Mcg/act Aers (Albuterol Sulfate) .Marland Kitchen.. 1 Puff Every 4 Hours As Needed  Allergies (verified): 1)  ! Ace Inhibitors 2)  ! Pcn 3)  ! * Blood Thiners 4)  ! 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 Allyson Sabal) left foot podiatric surgery 2011  Family History: Last updated: 10/05/2009 Family History of Alcoholism/Addiction Family History of Arthritis Family History Diabetes 3/6 sibs Family History High cholesterol Family History Hypertension Family History of Cardiovascular disorder Family History of Colon Cancer:Mother Family History of Colon Polyps:sisters  Mother alive at 66 with colon cancer,CVA Father deceased MI at age 65  Social History: Last updated: 10/20/2010 Disabled due to paranoid/schizophrenia       did work Medical laboratory scientific officer, Financial planner Single Current Smoker 2 ppd, started smoking 1974. Alcohol use-no Drug use-no Regular exercise-no Illicit Drug Use - no  Risk Factors: Alcohol Use: 0 (11/15/2010) Exercise: yes (06/23/2008)  Risk Factors: Smoking Status: current (11/15/2010) Packs/Day: 1.0 - 1.5 (11/15/2010) Cans of tobacco/wk: no (11/15/2010) Passive Smoke Exposure: no (11/15/2010)  Family History: Reviewed history from 10/05/2009 and no changes required. Family History of  Alcoholism/Addiction Family History of Arthritis Family History Diabetes 3/6 sibs Family History High cholesterol Family History Hypertension Family History of Cardiovascular disorder Family History of Colon Cancer:Mother Family History of Colon Polyps:sisters  Mother alive at 26 with colon cancer,CVA Father deceased MI at age 26  Social History: Reviewed history from 10/20/2010 and no changes required. Disabled due to paranoid/schizophrenia       did work Medical laboratory scientific officer, Financial planner Single Current Smoker 2 ppd, started smoking 1974. Alcohol use-no Drug use-no Regular exercise-no Illicit Drug Use - no  Review of Systems       The patient complains of weight gain, hoarseness, and prolonged cough.  The patient denies anorexia, fever, weight loss, chest pain, syncope, dyspnea on exertion, peripheral edema, headaches, hemoptysis, abdominal pain, hematuria, suspicious skin lesions, transient blindness, difficulty walking, enlarged lymph nodes, and angioedema.    Physical Exam  General:  alert, well-developed, well-nourished, and well-hydrated.   Head:  normocephalic, atraumatic, no abnormalities observed, and no abnormalities palpated.   Ears:  R ear normal and L ear normal.   Nose:  no  nasal discharge, no mucosal pallor, no mucosal edema, no airflow obstruction, no intranasal foreign body, no nasal polyps, no nasal mucosal lesions, no mucosal friability, no active bleeding or clots, and no sinus percussion tenderness.   Mouth:  good dentition and pharynx pink and moist.   Neck:  supple, full ROM, no masses, no thyromegaly, no thyroid nodules or tenderness, no JVD, normal carotid upstroke, and no carotid bruits.   Lungs:  normal respiratory effort, no intercostal retractions, no accessory muscle use, normal breath sounds, no dullness, no fremitus, no crackles, and no wheezes.   Heart:  normal rate, regular rhythm, no murmur, no gallop, no rub, and no JVD.   Abdomen:   soft, non-tender, normal bowel sounds, no distention, no masses, no guarding, no rigidity, no rebound tenderness, no abdominal hernia, no inguinal hernia, no hepatomegaly, and no splenomegaly.   Msk:  normal ROM, no joint tenderness, no joint swelling, no joint warmth, no redness over joints, no joint deformities, no joint instability, and no crepitation.   Pulses:  R and L carotid,radial,femoral,dorsalis pedis and posterior tibial pulses are full and equal bilaterally Extremities:  No clubbing, cyanosis, edema, or deformity noted with normal full range of motion of all joints.   Neurologic:  No cranial nerve deficits noted. Station and gait are normal. Plantar reflexes are down-going bilaterally. DTRs are symmetrical throughout. Sensory, motor and coordinative functions appear intact. Skin:  turgor normal, color normal, no rashes, no suspicious lesions, no ecchymoses, no petechiae, and no purpura.   Cervical Nodes:  no anterior cervical adenopathy and no posterior cervical adenopathy.   Axillary Nodes:  no R axillary adenopathy and no L axillary adenopathy.   Psych:  Cognition and judgment appear intact. Alert and cooperative with normal attention span and concentration. No apparent delusions, illusions, hallucinations  Diabetes Management Exam:    Foot Exam (with socks and/or shoes not present):       Sensory-Pinprick/Light touch:          Left medial foot (L-4): normal          Left dorsal foot (L-5): normal          Left lateral foot (S-1): normal          Right medial foot (L-4): normal          Right dorsal foot (L-5): normal          Right lateral foot (S-1): normal       Sensory-Monofilament:          Left foot: normal          Right foot: normal       Inspection:          Left foot: normal          Right foot: normal       Nails:          Left foot: normal          Right foot: normal   Impression & Recommendations:  Problem # 1:  BRONCHITIS-ACUTE (ICD-466.0) Assessment  New  His updated medication list for this problem includes:    Albuterol Sulfate (2.5 Mg/40ml) 0.083% Nebu (Albuterol sulfate) .Marland Kitchen... 1vial via neb every 4 hrs as needed    Spiriva Handihaler 18 Mcg Caps (Tiotropium bromide monohydrate) .Marland Kitchen... 1 inhalation once daily    Proair Hfa 108 (90 Base) Mcg/act Aers (Albuterol sulfate) .Marland Kitchen... 1 puff every 4 hours as needed    Avelox 400 Mg Tabs (Moxifloxacin hcl) ..... One  by mouth once daily for 10 days  Problem # 2:  COUGH (ICD-786.2) Assessment: Deteriorated  Orders: T-2 View CXR (71020TC)  Problem # 3:  COPD (ICD-496) Assessment: Unchanged  His updated medication list for this problem includes:    Albuterol Sulfate (2.5 Mg/36ml) 0.083% Nebu (Albuterol sulfate) .Marland Kitchen... 1vial via neb every 4 hrs as needed    Spiriva Handihaler 18 Mcg Caps (Tiotropium bromide monohydrate) .Marland Kitchen... 1 inhalation once daily    Proair Hfa 108 (90 Base) Mcg/act Aers (Albuterol sulfate) .Marland Kitchen... 1 puff every 4 hours as needed  Pulmonary Functions Reviewed: O2 sat: 94 (11/15/2010)     Vaccines Reviewed: Pneumovax: Pneumovax (12/06/2007)   Flu Vax: Fluvax 3+ (08/05/2010)  Complete Medication List: 1)  Paxil 30 Mg Tabs (Paroxetine hcl) .... Take 2 tablet by mouth once a day 2)  Trazodone Hcl 100 Mg Tabs (Trazodone hcl) .... Take 3 tab by mouth at bedtime 3)  Coreg 12.5 Mg Tabs (Carvedilol) .Marland Kitchen.. 1 tab two times a day 4)  Xanax 1 Mg Tabs (Alprazolam) .Marland Kitchen.. 1 tab by mouth every 8 hours as needed 5)  Albuterol Sulfate (2.5 Mg/17ml) 0.083% Nebu (Albuterol sulfate) .Marland Kitchen.. 1vial via neb every 4 hrs as needed 6)  Vicodin Hp 10-660 Mg Tabs (Hydrocodone-acetaminophen) .... One by mouth qid as needed for pain 7)  Spiriva Handihaler 18 Mcg Caps (Tiotropium bromide monohydrate) .Marland Kitchen.. 1 inhalation once daily 8)  Crestor 20 Mg Tabs (Rosuvastatin calcium) .... One by mouth once daily for cholesterol 9)  Element Control Normal Liqd (Blood glucose calibration) .... Check meter with each new vial of  test strips or irregular readings 10)  Element Test Strp (Glucose blood) .... Test once daily 11)  Lancets Misc (Lancets) .... Test once daily 12)  Lancing Device Misc (Lancet devices) .... Teast once daily 13)  Element Auto System  .... Test once daily 14)  Nexium 40 Mg Cpdr (Esomeprazole magnesium) .... One by mouth once daily 15)  Astepro 0.15 % Soln (Azelastine hcl) .Marland Kitchen.. 1 to 2 sprays in each nostril up to two times a day as needed for itching and allergy 16)  Proair Hfa 108 (90 Base) Mcg/act Aers (Albuterol sulfate) .Marland Kitchen.. 1 puff every 4 hours as needed 17)  Avelox 400 Mg Tabs (Moxifloxacin hcl) .... One by mouth once daily for 10 days  Patient Instructions: 1)  Please schedule a follow-up appointment in 2 weeks. 2)  Take your antibiotic as prescribed until ALL of it is gone, but stop if you develop a rash or swelling and contact our office as soon as possible. 3)  Acute bronchitis symptoms for less than 10 days are not helped by antibiotics. take over the counter cough medications. call if no improvment in  5-7 days, sooner if increasing cough, fever, or new symptoms( shortness of breath, chest pain). Prescriptions: AVELOX 400 MG TABS (MOXIFLOXACIN HCL) One by mouth once daily for 10 days  #10 x 1   Entered and Authorized by:   Etta Grandchild MD   Signed by:   Etta Grandchild MD on 11/15/2010   Method used:   Electronically to        [facility] (retail)       [address]       [place], [place]  [id number]       Ph: [phone] or [phone]       Fax: [phone]   RxID:   [id number]    Orders Added: 1)  T-2 View CXR [71020TC]  2)  Est. Patient Level IV [60454]

## 2010-11-29 ENCOUNTER — Encounter: Payer: Self-pay | Admitting: Cardiovascular Disease

## 2010-11-29 ENCOUNTER — Encounter: Payer: Self-pay | Admitting: Internal Medicine

## 2010-11-29 ENCOUNTER — Other Ambulatory Visit: Payer: Medicare Other

## 2010-11-29 ENCOUNTER — Other Ambulatory Visit: Payer: Self-pay | Admitting: Internal Medicine

## 2010-11-29 ENCOUNTER — Ambulatory Visit (INDEPENDENT_AMBULATORY_CARE_PROVIDER_SITE_OTHER): Payer: Medicare Other | Admitting: Internal Medicine

## 2010-11-29 DIAGNOSIS — G4733 Obstructive sleep apnea (adult) (pediatric): Secondary | ICD-10-CM | POA: Insufficient documentation

## 2010-11-29 DIAGNOSIS — E118 Type 2 diabetes mellitus with unspecified complications: Secondary | ICD-10-CM

## 2010-11-29 DIAGNOSIS — B370 Candidal stomatitis: Secondary | ICD-10-CM

## 2010-11-29 DIAGNOSIS — J449 Chronic obstructive pulmonary disease, unspecified: Secondary | ICD-10-CM

## 2010-11-29 DIAGNOSIS — R05 Cough: Secondary | ICD-10-CM

## 2010-11-29 DIAGNOSIS — N529 Male erectile dysfunction, unspecified: Secondary | ICD-10-CM

## 2010-11-29 DIAGNOSIS — N342 Other urethritis: Secondary | ICD-10-CM

## 2010-11-29 DIAGNOSIS — R059 Cough, unspecified: Secondary | ICD-10-CM

## 2010-11-29 DIAGNOSIS — I10 Essential (primary) hypertension: Secondary | ICD-10-CM

## 2010-11-29 DIAGNOSIS — E782 Mixed hyperlipidemia: Secondary | ICD-10-CM

## 2010-11-29 DIAGNOSIS — Z7251 High risk heterosexual behavior: Secondary | ICD-10-CM

## 2010-11-29 DIAGNOSIS — D696 Thrombocytopenia, unspecified: Secondary | ICD-10-CM

## 2010-11-29 DIAGNOSIS — J4489 Other specified chronic obstructive pulmonary disease: Secondary | ICD-10-CM

## 2010-11-29 DIAGNOSIS — G4730 Sleep apnea, unspecified: Secondary | ICD-10-CM

## 2010-11-29 LAB — CBC WITH DIFFERENTIAL/PLATELET
Basophils Relative: 0.4 % (ref 0.0–3.0)
Eosinophils Relative: 0.8 % (ref 0.0–5.0)
Hemoglobin: 16.1 g/dL (ref 13.0–17.0)
Lymphocytes Relative: 15.6 % (ref 12.0–46.0)
Monocytes Relative: 7.4 % (ref 3.0–12.0)
Neutrophils Relative %: 75.8 % (ref 43.0–77.0)
RBC: 5 Mil/uL (ref 4.22–5.81)
WBC: 13.4 10*3/uL — ABNORMAL HIGH (ref 4.5–10.5)

## 2010-11-29 LAB — CONVERTED CEMR LAB
GC Probe Amp, Urine: NEGATIVE
HIV: NONREACTIVE

## 2010-11-29 LAB — LIPID PANEL
HDL: 34 mg/dL — ABNORMAL LOW (ref >39.00)
LDL Cholesterol: 115 mg/dL — ABNORMAL HIGH (ref 0–99)
Total CHOL/HDL Ratio: 5
Triglycerides: 145 mg/dL (ref 0.0–149.0)

## 2010-11-29 LAB — HEPATIC FUNCTION PANEL
ALT: 23 U/L (ref 0–53)
AST: 22 U/L (ref 0–37)
Alkaline Phosphatase: 78 U/L (ref 39–117)
Bilirubin, Direct: 0.1 mg/dL (ref 0.0–0.3)
Total Protein: 6.5 g/dL (ref 6.0–8.3)

## 2010-11-29 LAB — BASIC METABOLIC PANEL
Calcium: 8.9 mg/dL (ref 8.4–10.5)
Creatinine, Ser: 0.8 mg/dL (ref 0.4–1.5)
Sodium: 136 mEq/L (ref 135–145)

## 2010-11-29 LAB — HM DIABETES FOOT EXAM

## 2010-11-30 ENCOUNTER — Telehealth: Payer: Self-pay | Admitting: Internal Medicine

## 2010-12-07 ENCOUNTER — Ambulatory Visit: Payer: Self-pay | Admitting: Internal Medicine

## 2010-12-07 ENCOUNTER — Telehealth: Payer: Self-pay | Admitting: Internal Medicine

## 2010-12-07 NOTE — Progress Notes (Signed)
  Phone Note Call from Patient   Caller: Patient Summary of Call: Patient lmovm requesting to check status of Cpap referral..Marland KitchenAlvy Beal Archie CMA  November 30, 2010 2:25 PM   Follow-up for Phone Call        Pt informed of sleep eval Follow-up by: Lamar Sprinkles, CMA,  December 01, 2010 1:26 PM

## 2010-12-07 NOTE — Assessment & Plan Note (Signed)
Summary: 3 MO ROV /NWS#--PER PT RS TO 2 MTH FU FROM 10/05/10-STC PER PT...   Vital Signs:  Patient profile:   50 year old male Height:      75 inches Weight:      225.75 pounds O2 Sat:      95 % on Room air Temp:     97.6 degrees F oral Pulse rate:   80 / minute Pulse rhythm:   regular Resp:     16 per minute BP sitting:   100 / 60  (left arm) Cuff size:   large  Vitals Entered By: Rock Nephew CMA (November 29, 2010 8:33 AM)  O2 Flow:  Room air CC: Pt here to discuss smoking and lab orders, Hypertension Management Is Patient Diabetic? Yes Did you bring your meter with you today? No  Does patient need assistance? Functional Status Self care Ambulation Normal   Primary Care Provider:  Etta Grandchild MD  CC:  Pt here to discuss smoking and lab orders and Hypertension Management.  History of Present Illness: He returns for f/up and he tells me that he wants to get a CPAP machine because he wakes up at night gasping for air.  He also wants to do another full STD screen. He tells me that he is monogamous and safe but is "paranoid" about infections.  Hypertension History:      He denies headache, chest pain, palpitations, dyspnea with exertion, orthopnea, PND, peripheral edema, visual symptoms, neurologic problems, syncope, and side effects from treatment.  He notes no problems with any antihypertensive medication side effects.        Positive major cardiovascular risk factors include male age 25 years old or older, diabetes, hyperlipidemia, hypertension, and current tobacco user.  Negative major cardiovascular risk factors include negative family history for ischemic heart disease.        Positive history for target organ damage include cardiac end organ damage (either CHF or LVH) and peripheral vascular disease.  Further assessment for target organ damage reveals no history of ASHD, stroke/TIA, renal insufficiency, or hypertensive retinopathy.     Preventive Screening-Counseling  & Management  Alcohol-Tobacco     Smoking Cessation Counseling: yes  Allergies: 1)  ! Ace Inhibitors 2)  ! Pcn 3)  ! * Blood Thiners 4)  ! 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 Allyson Sabal) left foot podiatric surgery 2011  Family History: Last updated: 10/05/2009 Family History of Alcoholism/Addiction Family History of Arthritis Family History Diabetes 3/6 sibs Family History High cholesterol Family History Hypertension Family History of Cardiovascular disorder Family History of Colon Cancer:Mother Family History of Colon Polyps:sisters  Mother alive at 11 with colon cancer,CVA Father deceased MI at  age 85  Social History: Last updated: 10/20/2010 Disabled due to paranoid/schizophrenia       did work Medical laboratory scientific officer, Financial planner Single Current Smoker 2 ppd, started smoking 1974. Alcohol use-no Drug use-no Regular exercise-no Illicit Drug Use - no  Risk Factors: Alcohol Use: 0  (11/15/2010) Exercise: yes (06/23/2008)  Risk Factors: Smoking Status: current (11/15/2010) Packs/Day: 1.0 - 1.5 (11/15/2010) Cans of tobacco/wk: no (11/15/2010) Passive Smoke Exposure: no (11/15/2010)  Family History: Reviewed history from 10/05/2009 and no changes required. Family History of Alcoholism/Addiction Family History of Arthritis Family History Diabetes 3/6 sibs Family History High cholesterol Family History Hypertension Family History of Cardiovascular disorder Family History of Colon Cancer:Mother Family History of Colon Polyps:sisters  Mother alive at 57 with colon cancer,CVA Father deceased MI at age 70  Social History: Reviewed history from 10/20/2010 and no changes required. Disabled due to paranoid/schizophrenia       did work Medical laboratory scientific officer, Financial planner Single Current Smoker 2 ppd, started smoking 1974. Alcohol use-no Drug use-no Regular exercise-no Illicit Drug Use - no  Review of Systems  The patient denies anorexia, fever, weight loss, weight gain, chest pain, dyspnea on exertion, peripheral edema, prolonged cough, headaches, hemoptysis, abdominal pain, hematuria, genital sores, muscle weakness, suspicious skin lesions, difficulty walking, depression, unusual weight change, abnormal bleeding, enlarged lymph nodes, angioedema, and testicular masses.   General:  Denies chills, fatigue, fever, loss of appetite, malaise, sleep disorder, sweats, and weakness. Resp:  Complains of cough, excessive snoring, hypersomnolence, and morning headaches; denies chest discomfort, chest pain with inspiration, coughing up blood, pleuritic, shortness of breath, sputum productive, and wheezing. GU:  Denies decreased libido, discharge, dysuria, erectile dysfunction, genital sores, hematuria, incontinence, nocturia, urinary frequency, and urinary hesitancy. Endo:  Denies cold intolerance, excessive hunger, excessive thirst, excessive urination, heat intolerance,  polyuria, and weight change.  Physical Exam  General:  alert, well-developed, well-nourished, and well-hydrated.   Head:  normocephalic, atraumatic, no abnormalities observed, and no abnormalities palpated.   Eyes:  vision grossly intact and pupils equal.   Mouth:  good dentition and pharynx pink and moist.   Neck:  supple, full ROM, no masses, no thyromegaly, no thyroid nodules or tenderness, no JVD, normal carotid upstroke, and no carotid bruits.   Lungs:  normal respiratory effort, no intercostal retractions, no accessory muscle use, normal breath sounds, no dullness, no fremitus, no crackles, and no wheezes.   Heart:  normal rate, regular rhythm, no murmur, no gallop, no rub, and no JVD.   Abdomen:  soft, non-tender, normal bowel sounds, no distention, no masses, no guarding, no rigidity, no rebound tenderness, no abdominal hernia, no inguinal hernia, no hepatomegaly, and no splenomegaly.   Msk:  normal ROM, no joint tenderness, no joint swelling, no joint warmth, no redness over joints, no joint deformities, no joint instability, and no crepitation.   Pulses:  R and L carotid,radial,femoral,dorsalis pedis and posterior tibial pulses are full and equal bilaterally Extremities:  No clubbing, cyanosis, edema, or deformity noted with normal full range of motion of all joints.   Neurologic:  No cranial nerve deficits noted. Station and gait are normal. Plantar reflexes are down-going bilaterally. DTRs are symmetrical throughout. Sensory, motor and coordinative functions appear intact. Skin:  Intact without suspicious lesions or rashes Cervical Nodes:  No lymphadenopathy noted Axillary Nodes:  No palpable lymphadenopathy Psych:  Cognition and judgment appear intact. Alert and cooperative with normal attention span and concentration. No apparent delusions, illusions, hallucinations  Diabetes Management Exam:  Foot Exam (with socks and/or shoes not present):       Sensory-Pinprick/Light  touch:          Left medial foot (L-4): normal          Left dorsal foot (L-5): normal          Left lateral foot (S-1): normal          Right medial foot (L-4): normal          Right dorsal foot (L-5): normal          Right lateral foot (S-1): normal       Sensory-Monofilament:          Left foot: normal          Right foot: normal       Inspection:          Left foot: normal          Right foot: normal       Nails:          Left foot: normal          Right foot: normal   Impression & Recommendations:  Problem # 1:  SLEEP APNEA (ICD-780.57) Assessment New  Orders: Sleep Disorder Referral (Sleep Disorder)  Problem # 2:  OTHER URETHRITIS (ZOX-096.04) Assessment: Unchanged  Orders: Venipuncture (54098) T-Chlamydia  Probe, urine (11914-78295) T-GC Probe, urine (62130-86578) T-HIV Antibody  (Reflex) ([id number]) T-RPR (Syphilis) (13244-01027) TLB-Lipid Panel (80061-LIPID) TLB-BMP (Basic Metabolic Panel-BMET) (80048-METABOL) TLB-CBC Platelet - w/Differential (85025-CBCD) TLB-Hepatic/Liver Function Pnl (80076-HEPATIC) TLB-TSH (Thyroid Stimulating Hormone) (84443-TSH) TLB-A1C / Hgb A1C (Glycohemoglobin) (83036-A1C)  Problem # 3:  TOBACCO ABUSE (ICD-305.1) Assessment: Unchanged  His updated medication list for this problem includes:    Chantix Starting Month Pak 0.5 Mg X 11 & 1 Mg X 42 Tabs (Varenicline tartrate)  Encouraged smoking cessation and discussed different methods for smoking cessation.   Problem # 4:  HYPERTENSION, BENIGN (ICD-401.1) Assessment: Improved  His updated medication list for this problem includes:    Coreg 12.5 Mg Tabs (Carvedilol) .Marland Kitchen... 1 tab two times a day  Orders: Venipuncture (25366) T-Chlamydia  Probe, urine (44034-74259) T-GC Probe, urine [phone]) T-HIV Antibody  (Reflex) [id number]) T-RPR (Syphilis) (06301-60109) TLB-Lipid Panel (80061-LIPID) TLB-BMP (Basic Metabolic Panel-BMET) (80048-METABOL) TLB-CBC Platelet -  w/Differential (85025-CBCD) TLB-Hepatic/Liver Function Pnl (80076-HEPATIC) TLB-TSH (Thyroid Stimulating Hormone) (84443-TSH) TLB-A1C / Hgb A1C (Glycohemoglobin) (83036-A1C)  BP today: 100/60 Prior BP: 138/80 (11/15/2010)  Prior 10 Yr Risk Heart Disease: 14 % (08/05/2010)  Labs Reviewed: K+: 4.3 (10/07/2010) Creat: : 0.8 (10/07/2010)   Chol: 132 (10/07/2010)   HDL: 29.60 (10/07/2010)   LDL: 80 (10/07/2010)   TG: 111.0 (10/07/2010)  Problem # 5:  COPD (ICD-496) Assessment: Unchanged  His updated medication list for this problem includes:    Albuterol Sulfate (2.5 Mg/12ml) 0.083% Nebu (Albuterol sulfate) .Marland Kitchen... 1vial via neb every 4 hrs as needed    Spiriva Handihaler 18 Mcg Caps (Tiotropium bromide monohydrate) .Marland Kitchen... 1 inhalation once daily    Proair Hfa 108 (90 Base) Mcg/act Aers (Albuterol sulfate) .Marland Kitchen... 1 puff every 4 hours as needed  Pulmonary Functions Reviewed: O2 sat: 95 (11/29/2010)     Vaccines Reviewed: Pneumovax: Pneumovax (12/06/2007)   Flu Vax: Fluvax 3+ (08/05/2010)  Problem # 6:  SEXUAL ACTIVITY, HIGH RISK (ICD-V69.2) Assessment: Unchanged  Orders: Venipuncture (32355) T-Chlamydia  Probe, urine (73220-25427) T-GC Probe, urine (06237-62831) T-HIV Antibody  (Reflex) ([id number]) T-RPR (Syphilis) (10626-94854) TLB-Lipid Panel (80061-LIPID) TLB-BMP (Basic Metabolic Panel-BMET) (80048-METABOL) TLB-CBC Platelet - w/Differential (  85025-CBCD) TLB-Hepatic/Liver Function Pnl (80076-HEPATIC) TLB-TSH (Thyroid Stimulating Hormone) (84443-TSH) TLB-A1C / Hgb A1C (Glycohemoglobin) (83036-A1C)  Complete Medication List: 1)  Paxil 30 Mg Tabs (Paroxetine hcl) .... Take 2 tablet by mouth once a day 2)  Trazodone Hcl 100 Mg Tabs (Trazodone hcl) .... Take 3 tab by mouth at bedtime 3)  Coreg 12.5 Mg Tabs (Carvedilol) .Marland Kitchen.. 1 tab two times a day 4)  Xanax 1 Mg Tabs (Alprazolam) .Marland Kitchen.. 1 tab by mouth every 8 hours as needed 5)  Albuterol Sulfate (2.5 Mg/47ml) 0.083% Nebu (Albuterol  sulfate) .Marland Kitchen.. 1vial via neb every 4 hrs as needed 6)  Vicodin Hp 10-660 Mg Tabs (Hydrocodone-acetaminophen) .... One by mouth qid as needed for pain 7)  Spiriva Handihaler 18 Mcg Caps (Tiotropium bromide monohydrate) .Marland Kitchen.. 1 inhalation once daily 8)  Crestor 20 Mg Tabs (Rosuvastatin calcium) .... One by mouth once daily for cholesterol 9)  Element Control Normal Liqd (Blood glucose calibration) .... Check meter with each new vial of test strips or irregular readings 10)  Element Test Strp (Glucose blood) .... Test once daily 11)  Lancets Misc (Lancets) .... Test once daily 12)  Lancing Device Misc (Lancet devices) .... Teast once daily 13)  Element Auto System  .... Test once daily 14)  Nexium 40 Mg Cpdr (Esomeprazole magnesium) .... One by mouth once daily 15)  Astepro 0.15 % Soln (Azelastine hcl) .Marland Kitchen.. 1 to 2 sprays in each nostril up to two times a day as needed for itching and allergy 16)  Proair Hfa 108 (90 Base) Mcg/act Aers (Albuterol sulfate) .Marland Kitchen.. 1 puff every 4 hours as needed 17)  Chantix Starting Month Pak 0.5 Mg X 11 & 1 Mg X 42 Tabs (Varenicline tartrate)  Hypertension Assessment/Plan:      The patient's hypertensive risk group is category C: Target organ damage and/or diabetes.  His calculated 10 year risk of coronary heart disease is 14 %.  Today's blood pressure is 100/60.  His blood pressure goal is < 130/80.  Patient Instructions: 1)  Please schedule a follow-up appointment in 3 months. 2)  Tobacco is very bad for your health and your loved ones! You Should stop smoking!. 3)  Stop Smoking Tips: Choose a Quit date. Cut down before the Quit date. decide what you will do as a substitute when you feel the urge to smoke(gum,toothpick,exercise). 4)  It is important that you exercise regularly at least 20 minutes 5 times a week. If you develop chest pain, have severe difficulty breathing, or feel very tired , stop exercising immediately and seek medical attention. 5)  You need to lose  weight. Consider a lower calorie diet and regular exercise.  6)  If you could be exposed to sexually transmitted diseases, you should use a condom. 7)  Check your blood sugars regularly. If your readings are usually above 200 or below 70 you should contact our office. 8)  It is important that your Diabetic A1c level is checked every 3 months. 9)  See your eye doctor yearly to check for diabetic eye damage. 10)  Check your feet each night for sore areas, calluses or signs of infection. 11)  Check your Blood Pressure regularly. If it is above 130/80: you should make an appointment.   Orders Added: 1)  Venipuncture [04540] 2)  T-Chlamydia  Probe, urine [phone] 3)  T-GC Probe, urine [phone] 4)  T-HIV Antibody  (Reflex) [[id number]] 5)  T-RPR (Syphilis) [84132-44010] 6)  Sleep Disorder Referral [Sleep Disorder] 7)  TLB-Lipid Panel [  80061-LIPID] 8)  TLB-BMP (Basic Metabolic Panel-BMET) [80048-METABOL] 9)  TLB-CBC Platelet - w/Differential [85025-CBCD] 10)  TLB-Hepatic/Liver Function Pnl [80076-HEPATIC] 11)  TLB-TSH (Thyroid Stimulating Hormone) [84443-TSH] 12)  TLB-A1C / Hgb A1C (Glycohemoglobin) [83036-A1C] 13)  Est. Patient Level IV [81191]

## 2010-12-08 ENCOUNTER — Encounter (HOSPITAL_COMMUNITY): Payer: Medicare Other | Admitting: Physician Assistant

## 2010-12-10 ENCOUNTER — Encounter (HOSPITAL_COMMUNITY): Payer: Medicare Other | Admitting: Licensed Clinical Social Worker

## 2010-12-10 DIAGNOSIS — F200 Paranoid schizophrenia: Secondary | ICD-10-CM

## 2010-12-14 NOTE — Progress Notes (Signed)
  Phone Note Call from Patient Call back at Home Phone [phone] Call back at [phone]   Summary of Call: Patient is requesting rx for chantix, OK?  Initial call taken by: Lamar Sprinkles, CMA,  December 07, 2010 3:29 PM  Follow-up for Phone Call        ok Follow-up by: Etta Grandchild MD,  December 07, 2010 7:08 PM    Prescriptions: CHANTIX STARTING MONTH PAK 0.5 MG X 11 & 1 MG X 42 TABS (VARENICLINE TARTRATE)   #1 x 0   Entered by:   Rock Nephew CMA   Authorized by:   Etta Grandchild MD   Signed by:   Rock Nephew CMA on 12/08/2010   Method used:   Faxed to ...       [facility] (retail)       [address]       [place], [place]  [id number]       Ph: [phone] or [phone]       Fax: [phone]   RxID:   [id number]

## 2010-12-15 ENCOUNTER — Other Ambulatory Visit: Payer: Self-pay | Admitting: Internal Medicine

## 2010-12-15 DIAGNOSIS — I739 Peripheral vascular disease, unspecified: Secondary | ICD-10-CM

## 2010-12-16 ENCOUNTER — Encounter (HOSPITAL_COMMUNITY): Payer: Medicare Other | Admitting: Licensed Clinical Social Worker

## 2010-12-16 ENCOUNTER — Encounter: Payer: Self-pay | Admitting: Cardiology

## 2010-12-16 ENCOUNTER — Ambulatory Visit: Payer: Self-pay | Admitting: Cardiovascular Disease

## 2010-12-21 ENCOUNTER — Encounter (HOSPITAL_COMMUNITY): Payer: Medicare Other | Admitting: Physician Assistant

## 2010-12-29 ENCOUNTER — Encounter (HOSPITAL_COMMUNITY): Payer: Medicare Other | Admitting: Licensed Clinical Social Worker

## 2010-12-29 LAB — GLUCOSE, CAPILLARY
Glucose-Capillary: 110 mg/dL — ABNORMAL HIGH (ref 70–99)
Glucose-Capillary: 95 mg/dL (ref 70–99)

## 2010-12-29 LAB — CBC
HCT: 44.8 % (ref 39.0–52.0)
Hemoglobin: 15.3 g/dL (ref 13.0–17.0)
MCHC: 34 g/dL (ref 30.0–36.0)
MCV: 90.9 fL (ref 78.0–100.0)
RDW: 15.3 % (ref 11.5–15.5)

## 2010-12-29 LAB — BASIC METABOLIC PANEL
CO2: 25 mEq/L (ref 19–32)
Calcium: 8.8 mg/dL (ref 8.4–10.5)
Chloride: 107 mEq/L (ref 96–112)
Glucose, Bld: 79 mg/dL (ref 70–99)
Sodium: 136 mEq/L (ref 135–145)

## 2010-12-30 ENCOUNTER — Institutional Professional Consult (permissible substitution): Payer: Medicare Other | Admitting: Pulmonary Disease

## 2011-01-04 ENCOUNTER — Other Ambulatory Visit: Payer: Self-pay | Admitting: Internal Medicine

## 2011-01-04 NOTE — Telephone Encounter (Signed)
no

## 2011-01-04 NOTE — Telephone Encounter (Signed)
Pt states that he hurt his leg in the gym and has had to take extra Vicodin due to the pain; he is requesting approval for an early refill - 5 days early. Please advise.

## 2011-01-04 NOTE — Telephone Encounter (Signed)
Pt informed

## 2011-01-05 LAB — GLUCOSE, CAPILLARY: Glucose-Capillary: 88 mg/dL (ref 70–99)

## 2011-01-06 LAB — CARDIAC PANEL(CRET KIN+CKTOT+MB+TROPI)
CK, MB: 1.3 ng/mL (ref 0.3–4.0)
CK, MB: 1.6 ng/mL (ref 0.3–4.0)
Relative Index: 0.4 (ref 0.0–2.5)
Total CK: 346 U/L — ABNORMAL HIGH (ref 7–232)
Total CK: 426 U/L — ABNORMAL HIGH (ref 7–232)
Total CK: 437 U/L — ABNORMAL HIGH (ref 7–232)
Troponin I: <0.01 ng/mL (ref 0.00–0.06)
Troponin I: <0.01 ng/mL (ref 0.00–0.06)
Troponin I: <0.01 ng/mL (ref 0.00–0.06)

## 2011-01-06 LAB — COMPREHENSIVE METABOLIC PANEL
ALT: 22 U/L (ref 0–53)
Albumin: 3.7 g/dL (ref 3.5–5.2)
Alkaline Phosphatase: 85 U/L (ref 39–117)
BUN: 6 mg/dL (ref 6–23)
Chloride: 103 mEq/L (ref 96–112)
Glucose, Bld: 182 mg/dL — ABNORMAL HIGH (ref 70–99)
Potassium: 3.2 mEq/L — ABNORMAL LOW (ref 3.5–5.1)
Sodium: 137 mEq/L (ref 135–145)
Total Bilirubin: 0.7 mg/dL (ref 0.3–1.2)

## 2011-01-06 LAB — CBC
HCT: 39.4 % (ref 39.0–52.0)
HCT: 41.2 % (ref 39.0–52.0)
HCT: 42.2 % (ref 39.0–52.0)
Hemoglobin: 13.8 g/dL (ref 13.0–17.0)
MCHC: 34.9 g/dL (ref 30.0–36.0)
MCHC: 35.3 g/dL (ref 30.0–36.0)
MCV: 88.5 fL (ref 78.0–100.0)
MCV: 88.9 fL (ref 78.0–100.0)
Platelets: 106 10*3/uL — ABNORMAL LOW (ref 150–400)
Platelets: 110 10*3/uL — ABNORMAL LOW (ref 150–400)
Platelets: 123 10*3/uL — ABNORMAL LOW (ref 150–400)
RBC: 4.44 MIL/uL (ref 4.22–5.81)
RDW: 14.2 % (ref 11.5–15.5)
RDW: 14.3 % (ref 11.5–15.5)
RDW: 14.3 % (ref 11.5–15.5)
WBC: 8.2 10*3/uL (ref 4.0–10.5)

## 2011-01-06 LAB — BASIC METABOLIC PANEL
BUN: 5 mg/dL — ABNORMAL LOW (ref 6–23)
BUN: 9 mg/dL (ref 6–23)
CO2: 23 mEq/L (ref 19–32)
Calcium: 8.9 mg/dL (ref 8.4–10.5)
Chloride: 106 mEq/L (ref 96–112)
Chloride: 108 mEq/L (ref 96–112)
Creatinine, Ser: 0.93 mg/dL (ref 0.4–1.5)
GFR calc Af Amer: >60 mL/min (ref >60)
GFR calc Af Amer: >60 mL/min (ref >60)
GFR calc non Af Amer: >60 mL/min (ref >60)
GFR calc non Af Amer: >60 mL/min (ref >60)
Glucose, Bld: 108 mg/dL — ABNORMAL HIGH (ref 70–99)
Potassium: 3.5 mEq/L (ref 3.5–5.1)
Potassium: 3.9 mEq/L (ref 3.5–5.1)
Sodium: 137 mEq/L (ref 135–145)
Sodium: 140 mEq/L (ref 135–145)

## 2011-01-06 LAB — URINALYSIS, ROUTINE W REFLEX MICROSCOPIC
Bilirubin Urine: NEGATIVE
Glucose, UA: NEGATIVE mg/dL
Hgb urine dipstick: NEGATIVE
Ketones, ur: NEGATIVE mg/dL
Nitrite: NEGATIVE
Protein, ur: NEGATIVE mg/dL
Specific Gravity, Urine: 1.011 (ref 1.005–1.030)
Urobilinogen, UA: 1 mg/dL (ref 0.0–1.0)
pH: 6 (ref 5.0–8.0)

## 2011-01-06 LAB — GLUCOSE, CAPILLARY
Glucose-Capillary: 146 mg/dL — ABNORMAL HIGH (ref 70–99)
Glucose-Capillary: 174 mg/dL — ABNORMAL HIGH (ref 70–99)
Glucose-Capillary: 178 mg/dL — ABNORMAL HIGH (ref 70–99)
Glucose-Capillary: 185 mg/dL — ABNORMAL HIGH (ref 70–99)
Glucose-Capillary: 199 mg/dL — ABNORMAL HIGH (ref 70–99)
Glucose-Capillary: 209 mg/dL — ABNORMAL HIGH (ref 70–99)

## 2011-01-06 LAB — DIFFERENTIAL
Basophils Relative: 0 % (ref 0–1)
Eosinophils Absolute: 0.1 10*3/uL (ref 0.0–0.7)
Eosinophils Relative: 1 % (ref 0–5)
Neutrophils Relative %: 72 % (ref 43–77)

## 2011-01-06 LAB — BRAIN NATRIURETIC PEPTIDE: Pro B Natriuretic peptide (BNP): <30 pg/mL (ref 0.0–100.0)

## 2011-01-06 LAB — CULTURE, BLOOD (ROUTINE X 2)
Culture: NO GROWTH
Culture: NO GROWTH

## 2011-01-06 LAB — TROPONIN I: Troponin I: <0.01 ng/mL (ref 0.00–0.06)

## 2011-01-06 LAB — PROTIME-INR
INR: 1 (ref 0.00–1.49)
Prothrombin Time: 13.8 seconds (ref 11.6–15.2)

## 2011-01-06 LAB — HEMOGLOBIN A1C
Hgb A1c MFr Bld: 6.6 % — ABNORMAL HIGH (ref 4.6–6.1)
Mean Plasma Glucose: 143 mg/dL

## 2011-01-06 LAB — URINE CULTURE

## 2011-01-06 LAB — LIPID PANEL
Cholesterol: 140 mg/dL (ref 0–200)
LDL Cholesterol: 50 mg/dL (ref 0–99)
Total CHOL/HDL Ratio: 4.5 RATIO
VLDL: 59 mg/dL — ABNORMAL HIGH (ref 0–40)

## 2011-01-06 LAB — TSH: TSH: 2.012 u[IU]/mL (ref 0.350–4.500)

## 2011-01-06 LAB — D-DIMER, QUANTITATIVE: D-Dimer, Quant: 1.75 ug/mL-FEU — ABNORMAL HIGH (ref 0.00–0.48)

## 2011-01-06 LAB — APTT: aPTT: 34 seconds (ref 24–37)

## 2011-01-10 LAB — BASIC METABOLIC PANEL
BUN: 12 mg/dL (ref 6–23)
Chloride: 106 mEq/L (ref 96–112)
Glucose, Bld: 102 mg/dL — ABNORMAL HIGH (ref 70–99)
Potassium: 4 mEq/L (ref 3.5–5.1)

## 2011-01-10 LAB — CBC
HCT: 44.9 % (ref 39.0–52.0)
MCV: 90.1 fL (ref 78.0–100.0)
Platelets: 138 10*3/uL — ABNORMAL LOW (ref 150–400)
RDW: 16.6 % — ABNORMAL HIGH (ref 11.5–15.5)

## 2011-01-10 LAB — POCT CARDIAC MARKERS
CKMB, poc: <1 ng/mL — ABNORMAL LOW (ref 1.0–8.0)
Troponin i, poc: <0.05 ng/mL (ref 0.00–0.09)

## 2011-01-10 LAB — GLUCOSE, CAPILLARY: Glucose-Capillary: 100 mg/dL — ABNORMAL HIGH (ref 70–99)

## 2011-01-18 ENCOUNTER — Encounter (HOSPITAL_BASED_OUTPATIENT_CLINIC_OR_DEPARTMENT_OTHER): Payer: Medicare Other | Admitting: Licensed Clinical Social Worker

## 2011-01-18 DIAGNOSIS — F200 Paranoid schizophrenia: Secondary | ICD-10-CM

## 2011-01-19 ENCOUNTER — Encounter (HOSPITAL_COMMUNITY): Payer: Medicare Other | Admitting: Physician Assistant

## 2011-01-26 ENCOUNTER — Encounter (HOSPITAL_COMMUNITY): Payer: Medicare Other | Admitting: Licensed Clinical Social Worker

## 2011-01-27 ENCOUNTER — Encounter: Payer: Self-pay | Admitting: Pulmonary Disease

## 2011-01-31 ENCOUNTER — Institutional Professional Consult (permissible substitution): Payer: Medicare Other | Admitting: Pulmonary Disease

## 2011-01-31 ENCOUNTER — Other Ambulatory Visit: Payer: Self-pay

## 2011-01-31 MED ORDER — ALPRAZOLAM 1 MG PO TABS: 1.0000 mg | ORAL_TABLET | Freq: Three times a day (TID) | ORAL | Status: DC | PRN

## 2011-01-31 MED ORDER — ALPRAZOLAM 1 MG PO TABS: 1.0000 mg | ORAL_TABLET | Freq: Every evening | ORAL | Status: DC | PRN

## 2011-01-31 MED ORDER — HYDROCODONE-ACETAMINOPHEN 10-660 MG PO TABS: 1.0000 | ORAL_TABLET | Freq: Four times a day (QID) | ORAL | Status: DC | PRN

## 2011-01-31 NOTE — Telephone Encounter (Signed)
Rx approved and faxed back to pharmacy for hydrocodone. Returned call to patient to inform, he then asked if MD will refill his xanax 1 mg TID. Please advise Thanks

## 2011-01-31 NOTE — Telephone Encounter (Signed)
Received fax from pharmacy request refill for hydrocodone 10-660 qid prn (#120). Please adivse Thanks

## 2011-01-31 NOTE — Telephone Encounter (Signed)
ok 

## 2011-02-01 ENCOUNTER — Encounter (HOSPITAL_COMMUNITY): Payer: Medicare Other | Admitting: Licensed Clinical Social Worker

## 2011-02-08 NOTE — Discharge Summary (Signed)
[name], [name]               ACCOUNT NO.:  [id number]   MEDICAL RECORD NO.:  [id number]          PATIENT TYPE:  INP   LOCATION:  3731                         FACILITY:  [facility]   PHYSICIAN:  Vonna Kotyk R. Jacinto Halim, MD       DATE OF BIRTH:  05-26-1961   DATE OF ADMISSION:  09/01/2008  DATE OF DISCHARGE:  09/03/2008                               DISCHARGE SUMMARY   DISCHARGE DIAGNOSES:  1. Unstable angina with normal coronaries in December 2008, myocardial      infarction ruled out.  2. Prior history of cardiomyopathy with an ejection fraction of 35% in      December 2008, echocardiogram showed an ejection fraction to be      65%.  3. Insulin-dependent diabetes.  4. Treated hypertension.  5. Chronic obstructive pulmonary disease with pneumonia in 2009      requiring intubation.  6. History of schizophrenia.  7. Chronic renal insufficiency.   HOSPITAL COURSE:  The patient is a 50 year old male who presented on  September 01, 2008, with chest pain to [facility] ER.  Symptoms were worrisome  for unstable angina.  She was seen by Dr. Garen Lah.  He was admitted to  telemetry and enzymes were obtained.  His enzymes were all negative.  Review of his records show that he had patent coronaries in December  2008.  There is a past history of cardiomyopathy, although there was  some discrepancy between the echocardiogram and the catheterization with  the echocardiogram showing an EF of 65%.  The patient did have a Myoview  done on the 9th.  This was abnormal but Dr. Garen Lah felt that it was  most likely secondary to his nonischemic cardiomyopathy.  ACE inhibitor  was added and the patient was discharged.  He will follow up in our  office as an outpatient.   DISCHARGE MEDICATIONS:  1. Coreg 6.25 mg b.i.d.  2. NovoLog 100 units a day with 6 units before breakfast, 8 units      before lunch, and 6 units before supper.  3. Vicodin p.r.n.  4. Advair Diskus 250/50 q.12 h.  5. Ventolin 2 puffs b.i.d.  6.  Clonazepam 100 mg daily.  7. Xanax 1 mg three times a day.  8. Paxil 30 mg two times a day.  9. Aspirin 325 mg a day.  10.Vytorin 10/40 daily.  11.Chantix as directed.  12.Lisinopril 10 mg a day has been added.   LABORATORY DATA:  EKG shows sinus rhythm without acute changes.  Myoview  shows LV dilatation, which appears worse with rest with evidence of mild  ischemia in the mid basilar segments with an EF of 26%.  White count  7.3, hemoglobin 15.1, hematocrit 43.2, platelets 112, admission  platelets were 135.  Sodium 140, potassium  3.8, BUN 11, creatinine 0.8.  Liver functions were normal.  CK-MB and  troponins were negative x3.  Hemoglobin A1c is 7.1.  Cholesterol of 179  with an LDL of 68 and HDL of 35.   DISPOSITION:  The patient is discharged in stable condition and will  follow up with Dr.  Gwenyth Ober, P.A.      Cristy Hilts. Jacinto Halim, MD  Electronically Signed    LKK/MEDQ  D:  10/08/2008  T:  10/09/2008  Job:  [id number]

## 2011-02-08 NOTE — Consult Note (Signed)
[name]               ACCOUNT NO.:  [id number]   MEDICAL RECORD NO.:  [id number]           PATIENT TYPE:   LOCATION:                                 FACILITY:   PHYSICIAN:  Antonietta Breach, M.D.  DATE OF BIRTH:  [birth year]   DATE OF CONSULTATION:  10/09/2007  DATE OF DISCHARGE:                                 CONSULTATION   REQUESTING PHYSICIAN:  Charlaine Dalton. Sherene Sires, MD, FCCP   REASON FOR CONSULTATION:  Psychotic disorder, psychotropic medication  adjustment.   HISTORY OF PRESENT ILLNESS:  [name] is a 50 year old male  admitted to the [facility] on the September 24, 2007, with chest  pain and shortness of breath.   The patient has had approximately 4 weeks of depressed mood, decreased  energy, decreased concentration, and thoughts of hopelessness and  helplessness.  He is not currently having any hallucinations or  delusions.   His stress includes multiple general medical problems including being  admitted with community-acquired pneumonia.  He is now in the ICU  setting.   PAST PSYCHIATRIC HISTORY:  In review of the past medical record, the  patient has listed schizophrenia.   He underwent a psychiatric admission to [facility] in May  2007.  He was admitted then for an overdose of Paxil and trazodone.  He  was stating that he did not want to live anymore.   He was restarted on his clozapine at that time, Paxil 30 mg p.o. b.i.d.,  and trazodone 300 mg nightly.   The patient does have multiple episodes of auditory hallucinations  including an episode in April 2007, where the voices were telling him to  harm himself.  He was also admitted at that time to the [facility].   FAMILY PSYCHIATRIC HISTORY:  None known.   SOCIAL HISTORY:  The patient has been to Alcoholics Anonymous as well as  Narcotics Anonymous.  He is single, and traditionally has been living  with his mother.  He has 14 years of education.  He  does have a history  of DWI.  He has an associates degree in data processing.  He has never  been married.  Occupation, disabled.   The last time he used alcohol was approximately 4 weeks ago.  He is  denying using cocaine in several years.   PAST MEDICAL HISTORY:  1. Community-acquired pneumonia.  2. Gastroesophageal reflux disease.  3. Pancreatitis.   ALLERGIES:  No known drug allergies.   MEDICATIONS:  The MAR is reviewed.  The patient is on:  1. Clonazepam 2 mg b.i.d.  2. Risperdal 2 mg b.i.d.  3. Clozaril 200 mg q.a.m.  4. Haldol 1 mg t.i.d.   LABORATORY DATA:  WBC 15.8, hemoglobin 9.2, and platelet count 328.  Magnesium normal.  Sodium 136, BUN 17, and creatinine 0.74.  ANA  negative on October 04, 2007, SGOT 35, SGPT 24, and albumin 1.3.  INR on  October 05, 2007, was 1.0.   REVIEW OF SYSTEMS:  Constitutional; head, eyes, ears, nose throat,  mouth; neurologic; psychiatric; cardiovascular;  respiratory;  gastrointestinal; genitourinary; skin; musculoskeletal; hematologic;  lymphatic; endocrine; and metabolic, all unremarkable.   PHYSICAL EXAMINATION:  VITAL SIGNS:  Temperature 99.7, pulse 116,  respiratory rate 38, blood pressure 134/70, and O2 saturation on 30% O2  92%.  The patient is being weaned.  GENERAL APPEARANCE:  [name] is a middle-aged male, lying in a supine  position in his ICU bed with no abnormal involuntary movements.  MENTAL STATUS EXAM:  [name] is alert.  His attention span is mildly  decreased.  His concentration is mildly decreased.  He is oriented to  all spheres.  His fund of knowledge and intelligence are within normal  limits.  His affect is constricted.  His mood is depressed.  His speech  is soft with normal prosody and no dysarthria.  Thought process is  logical, coherent, and goal directed.  No looseness of associations.  Thought content; no thoughts of harming himself, no thoughts of harming  others, no delusions, and no  hallucinations.  Insight is intact.  Judgment is intact.   ASSESSMENT:  AXIS I:  1. 293.82, psychotic disorder, not otherwise specified, currently in      remission.  2. 293.83, mood disorder, not otherwise specified (functional and      general medical factors), depressed.  3. Rule out 296.33, major depressive disorder, recurrent and severe.  4. History of polysubstance dependence.  AXIS II:  Deferred.  AXIS III:  See past medical history.  AXIS IV:  General medical.  AXIS V:  55.   [name] is not at risk to harm himself or others.  He agrees to call  Emergency Services immediately for any psychiatric emergency symptoms.   The undersigned provided education.  The patient understands the  indications, alternatives, and adverse effects of the psychotropic  medication regimen below, and he wants to proceed.   RECOMMENDATIONS:  Would hold the p.m. Clozaril doses today.   Would restart the Clozaril tomorrow at 100 mg p.o. nightly, and then  would increase as tolerated by 50 mg per day to 200 mg q.a.m. and 300 mg  nightly.   Would discontinue the standing Haldol.  Would also discontinue the  Risperdal.   Would continue p.r.n. Haldol for severe agitation.  Would also add  Ativan 1-2 mg p.o. IM or IV q.4 h. p.r.n. mild agitation and anxiety.   Would restart his Paxil at 10 mg p.o. q.a.m. for antidepression and  would titrate by 10 mg per day as tolerated to 40 mg p.o. q.a.m.   Would try slowly tapering off the Klonopin over 1 week.   Outpatient psychiatric followup can be obtained at one of the clinics  attached to [facility] or [facility].      Antonietta Breach, M.D.  Electronically Signed     JW/MEDQ  D:  02/09/2008  T:  02/09/2008  Job:  [id number]

## 2011-02-08 NOTE — Discharge Summary (Signed)
NAMEHERCULES, [name]               ACCOUNT NO.:  [id number]   MEDICAL RECORD NO.:  [id number]          PATIENT TYPE:  INP   LOCATION:  5127                         FACILITY:  [facility]   PHYSICIAN:  Eduard Clos, MDDATE OF BIRTH:  July 21, 1961   DATE OF ADMISSION:  06/01/2008  DATE OF DISCHARGE:  06/04/2008                               DISCHARGE SUMMARY   HOSPITAL COURSE:  A 50 year old African American man with history of  paranoid schizophrenia was brought in after the patient was complaining  of shortness of breath.  The patient was found to be in diabetic  ketoacidosis with a bicarb of 8, anion gap of 15.  The patient was  initially started on IV insulin drip with frequent monitoring of his  basic metabolic panel and correction of his potassium.  Eventually, the  patient was discontinued from his IV insulin drip once his anion gap was  corrected and placed on Lantus insulin with sliding-scale coverage.  The  patient also was started on glipizide 5 mg p.o. daily.  The patient's  hemoglobin turned out to be around 12.  The patient was given diabetic  education and was then advised to follow with Outpatient Diabetic  Clinic.  At this time, as the patient's blood glucose is largely  controlled and anion gap is also controlled, the patient will be  discharged home and advised to follow with his primary care physician  within 3 days, to recheck his basic metabolic panel and to adjust  further dose of insulin.   FINAL DIAGNOSES:  1. Status post diabetic ketoacidosis.  2. New-onset diabetes mellitus type 2.  3. Sinus tachycardia.  4. History of paranoid schizophrenia.   DISCHARGE MEDICATIONS:  1. Coreg 6.25 mg p.o. b.i.d.  2. Clozapine 200 mg p.o. b.i.d.  3. Glipizide 5 mg p.o. daily.  4. Lantus insulin 15 units subcutaneous at bedtime.  5. Paxil 60 mg p.o. daily.  6. KCl 20 mEq p.o. daily for 3 days and stop.  7. Xanax 1 mg p.o. daily p.r.n. for anxiety.  8. NovoLog insulin  sliding scale premeal t.i.d., 151-200 two units      subcutaneous, 201-250 4 units subcutaneous, 251-300 8 units      subcutaneous, 301-350 10 units subcutaneous, and 351-400 20 units      subcutaneous.   PLAN:  The patient is advised to follow with his primary care physician  within 3 days and recheck his basic metabolic panel and maintain a blood  sugar log and present this to his primary care doctor, to be cautious  about hypoglycemia.  The patient is advised to be on a carb-modified  diet, advised to quit smoking.      Eduard Clos, MD  Electronically Signed    ANK/MEDQ  D:  06/04/2008  T:  06/05/2008  Job:  [id number]

## 2011-02-08 NOTE — H&P (Signed)
NAMENIMA, KEMPPAINEN               ACCOUNT NO.:  [id number]   MEDICAL RECORD NO.:  [id number]          PATIENT TYPE:  INP   LOCATION:  4714                         FACILITY:  [facility]   PHYSICIAN:  Michiel Cowboy, MDDATE OF BIRTH:  1961-01-19   DATE OF ADMISSION:  12/18/2008  DATE OF DISCHARGE:                              HISTORY & PHYSICAL   PRIMARY CARE Carmita Boom:  Sanda Linger, M.D.   CHIEF COMPLAINT:  Shortness of breath.   HISTORY OF THE PRESENT ILLNESS:  The patient is a 50 year old gentleman  with a history of COPD and schizophrenia who presents with about a few  hours of the onset of severe shortness of breath.  He states he is  usually shortness of breath and actually has to use 2 liters of oxygen  as needed at night, but today his shortness of breath became  particularly worse.  He had a low-grade fever up to 101.1 in the  emergency department.  Initially when he presented he was fairly  tachycardiac and dyspneic, but after receiving nebulizer treatment this  rapidly improved and now he is currently back to his baseline; however,  still wants to be admitted for observation.   REVIEW OF SYSTEMS:  The patient endorses chest pain, which is on and off  throughout the day.  He reports having a negative catheterization done  and per the discharge summary in December 2009 and normal coronary  arteries in December 2008.  Otherwise he denies any lower extremity  swelling.  He reports that he has generalized body aches, but otherwise  the review of systems is negative for nausea and vomiting.  He reports  that today he had an episode of diarrhea (x1).   PAST MEDICAL HISTORY:  The past medical history is significant for:  1. Anxiety.  2. Diabetes.  3. Emphysema.  4. Schizophrenia.  5. Hypertension.  6. History of remote cardiomyopathy, but repeat echocardiogram showed      an EF of 65%.  7. Chronic renal insufficiency;  currently improved.   SOCIAL HISTORY:  The patient  continues to smoke.  He drinks alcohol  occasionally.  He denies any drug abuse.   FAMILY HISTORY:  The family history is noncontributory.   ALLERGIES:  No known drug allergies.   MEDICATIONS:  1. Advair 250/50 mg inhaled twice a day.  2. Clozaril 200 mg in the morning and 300 mg at night.  3. Coreg; he states he 12.2 mg twice a day; however, he was discharged      on 6.125 mg twice a day.  4. Lantus 22 units subcu at bedtime.  5. NovoLog sliding scale.  6. Ventolin as needed.  7. Vicodin 1 tablet as needed.  8. __________  mg as needed three times a day.  9. Lisinopril 10 mg every day.  10.Paxil 30 mg every day.  11.Protonix 40 mg every day.  12.Vytorin 10/40 mg every day.  13.Flonase spray; he uses this once a day.  14,  the patient also takes Aciphex as needed.   PHYSICAL EXAMINATION:  VITAL SIGNS:  Temperature 101.1, blood pressure  initially 142/83 and now is 137/78.  Pulse initially 111 and now down to  78.  Respirations 20 and he is satting 99% on 2 liters.  GENERAL APPEARANCE:  The patient appears to be in no acute distress.  Currently he is actually breathing very comfortably.  HEENT:  Head is atraumatic.  Moist mucous membranes.  LUNGS:  The lungs hae fairly good aeration with very minimal wheezing  bilaterally. No crackles appreciated.  HEART:  The heart has a regular rate and rhythm with no murmurs, rubs or  gallops appreciated.  ABDOMEN:  The abdomen is soft, but somewhat distended.  Nontender.  Per  the patient he always has a slightly distended abdomen.  EXTREMITIES: The lower extremities are without clubbing, cyanosis or  edema.  NEUROLOGIC EXAMINATION:  Neurologically the patient is intact.  SKIN:  The skin is clean, dry and intact.   LABORATORY DATA:  White blood cell count 9.2 and hemoglobin 14.6.  Sodium 137, potassium 3.5 and creatinine 0.93.  D-dimer elevated at  1.75.  Chest x-ray was unremarkable, except for chronic bronchitic  changes, but nothing  acute.  CT scan of the chest showed no pulmonary  embolus, but there was mild lymphadenopathy and mild __________ .  His  thyroid is unremarkable.  EKG showed sinus tachycardia and heart rate of  108.  No ischemic changes noted.   ASSESSMENT AND PLAN:  This is a 50 year old gentleman who presents with  chronic obstructive pulmonary disease exacerbation, but cannot rule out  possible viral infection as well.   1. Chronic obstructive pulmonary disease.  We will make sure he      continues albuterol, Atrovent and Advair.  I instructed the patient      to quit smoking, which he states he understands, but it is hard to      do.  We will write an order for a nicotine patch while here.  We      will give him Augmentin and prednisone.  2. Febrile illness.  Given his generalized body aches he possibly has      the flu.  We will cover him with Tamiflu.  Per reports H1N1 is      still active in the community.  We will place him on respiratory      precaution/dropplet  precautions.  3. Diarrhea.  We will monitor.  4. History of congestive heart failure.  The last study showed an      ejection fraction of 65%.  The patient appears to be euvolemic.  We      will follow the Beta-natriuretic peptide.  With overhydration we      will check orthostatics.  5. Diabetes.  We will continue sliding scale Lantus.  6. History of hypertension.  Continue Coreg and lisinopril.  7. Hyperlipidemia.  Continue Vytorin.  8. History of schizophrenia.  Continue home medications.  9. Prophylaxes.  Protonix plus Lovenox.   Dr. Rene Paci will assume care in the morning.      Michiel Cowboy, MD  Electronically Signed     AVD/MEDQ  D:  12/19/2008  T:  12/19/2008  Job:  [id number]   cc:   Sanda Linger, MD

## 2011-02-08 NOTE — Op Note (Signed)
NAMECHRISTEPHER, [name]               ACCOUNT NO.:  [id number]   MEDICAL RECORD NO.:  [id number]          PATIENT TYPE:  INP   LOCATION:  2105                         FACILITY:  [facility]   PHYSICIAN:  Nelda Bucks, MD DATE OF BIRTH:  03/03/1961   DATE OF PROCEDURE:  DATE OF DISCHARGE:                               OPERATIVE REPORT   PROCEDURE:  Percutaneous tracheostomy, bronchoscopy.   ASSISTANT:  Leslye Peer, M.D.   BLOOD LOSS:  Less than 1 cc.   PREOPERATIVE DIAGNOSIS:  Necrotizing pneumonia, failure to wean.   POSTOPERATIVE DIAGNOSIS:  Status post tracheostomy for the above  diagnoses.   Consent was obtained from all of his family and his mother and sisters,  who are fully aware of risks, including pneumothorax, bleeding,  infection, and death.  Patient was placed in a supine position and  chlorhexidine preparation was applied.  The ventilator was adjusted to a  respiratory rate of 20 secondary to the use of paralytics for this  procedure.  The patient required continuous infusion of propofol, which  he was on prior, as well as fentanyl 100 mcg x3 in three times divided  doses, Versed 4 mg total.   The bronchoscopist then advanced the bronchoscope through the ET tube  and withdrew the endotracheal tube back to approximately 18 cm.  Then 6  cc of 2% lidocaine was used over the surgical site.  A 1.5 cm vertical  incision was made over the third intratracheal space.  Dissection was  made down into the tracheal planes.  Under direct visualization, a white  catheter sheath over an 18 gauge needle was inserted into the airway and  directly visualized without any posterior wall damage.  The catheter  sheath remained.  The wire was then placed through the white catheter  sheath, of course, after the needle was withdrawn.  The white catheter  sheath was then removed.  A 14 French punch dilator was placed over the  wire into the airway and directly visualized.  The 14 French  punch  dilator was then removed.  A rhinodilator kit, progressive dilator, 37  [place], was inserted into the airway over a glider and directly  visualized.  The dilator was then removed, and a glider and wire  remained.  A size 8 tracheostomy over a 20 French dilator was placed  over the wire and glider into the airway directly.  The wire, glider,  and 28 [place] dilator was removed.  Tracheostomy remained.  The balloon  was insufflated.  Dr. Delton Coombes then removed the ventilator adaptor over to  the new tracheostomy site.  Placed the bronchoscope through the adaptor  and visualized no active bleeding and no posterior wall damage.  Patient's saturations were 90% to 100% throughout the procedure.  He was  never hypotensive and had a mild tachycardia from 100 to 115.  The  patient tolerated the procedure very well, and portable chest x-ray is  pending.      Nelda Bucks, MD  Electronically Signed    DJF/MEDQ  D:  10/05/2007  T:  10/05/2007  Job:  [id number] 

## 2011-02-08 NOTE — Procedures (Signed)
NAMELIEUTENANT, ABARCA               ACCOUNT NO.:  [id number]   MEDICAL RECORD NO.:  [id number]          PATIENT TYPE:  AMB   LOCATION:  SDS                          FACILITY:  [facility]   PHYSICIAN:  Nanetta Batty, M.D.   DATE OF BIRTH:  12-12-1960   DATE OF PROCEDURE:  DATE OF DISCHARGE:                    PERIPHERAL VASCULAR INVASIVE PROCEDURE   PERIPHERAL ANGIOGRAM/PT AND STENT REPORT   Mr. Shovlin is a 50 year old mildly overweight single African American  male who has a nonischemic cardiomyopathy with normal coronary arteries  documented by Dr. Jeanella Cara at catheterization.  He does have  claudication with what appears to be an occluded iliac by duplex  ultrasound and ABI on the left of 0.7.  He continues to smoke one pack  per day down from 3 packs per day and has diabetes, hypertension, and  hyperlipidemia.  He presents now for angiography and endovascular  therapy for left thigh intermittent claudication.   PROCEDURE DESCRIPTION:  The patient was brought to the 2nd floor of  [facility] in the postabsorptive state.  He was  premedicated with p.o. Valium, IV Versed and fentanyl.  His right and  left groins were prepped and shaved in the usual sterile fashion.  One-  percent Xylocaine was used for local anesthesia.  A 6-French crossover  sheath was inserted into the right femoral artery using Seldinger  technique.  A 5-French sheath was inserted into the left femoral artery.  A 5-French pigtail catheter was used for abdominal aortography with  bifemoral runoff, using digital subtraction bolus-chase step-table  technique.  Visipaque dye was used for the entirety of the case.  Retrograde, aortic, ventricular pressures were recorded.   ANGIOGRAPHIC RESULTS:  1. Renal artery is normal.  2. Infrarenal done, abdominal aorta normal.  3. 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  4. Right lower extremity:  Normal with 3 vessel runoff.   PROCEDURE DESCRIPTION:  The patient received a total of 5000 units of  heparin intravenously as well as Angiomax bolus and infusion.  Attempts  were made to cross the occlusion antegrade using a 6-French IMA short  catheter and a SOS catheter along with a stiff-angle glide.  I was able  to puncture the proximal cap, but was unable to enter the true lumen  distally.  Following this, the 5-French sheath in the left femoral  artery was exchanged over [facility] wire for a 6-French bright tip.  The  [facility] wire was advanced into the chronic total occlusion through the  Quick-Cross and this was exchanged for an angled glidewire which then  was passed into the true lumen of the aorta, documented angiographically  and by pressure waveform.  Following this, the occluded segment was  dilated with a 4 x 6 Powerflex and the ostium was stented with a 7 x 18  Genesis on OPTA balloon stent premount.  The remainder of the vessel was  stented with an 8 x 4 SMART self-expanding  stent.  There was no  antegrade flow and a pressure gradient documented with abdominal  aortography revealing occlusion at the origin.  The assumption was that  the initial stent had not either entered the true lumen or there was an  obstruction proximal.  There was also what appears to be filling defects  within the stented segment, possibly related to inflow issue.  Based on  this, the entire segment was then restented with an 8 x 6 SMART stent  covering the entire segment with stent that extended more proximally and  more distally.  The ostium was dilated with a 7.4 balloon which resulted  in abolition of the gradient and restoration of the antegrade flow,  documented by both aortography and by a local injection via a 5-French  short IMA catheter within the ostium of the left common iliac artery.   IMPRESSION:  Successful  complicated percutaneous transluminal  angioplasty and stent procedure of an occluded [facility]) left common iliac  artery with endovascular reconstruction using both blue expandable and  self-expanding stents.  The patient did receive Angiomax bolus because  of what appeared to be visible thrombus within the stented segment, and  this will run at 0.25 mg per minute for the remainder of the bag, after  which time the sheaths will be removed 2 hours after Angiomax is  finished.  The patient has said that he will continue to smoke despite  counseling not to, which makes long term patency of the stent  questionable.   He left the lab in stable condition.  He will be hydrated overnight  because of the contrast load and his renal function will be assessed in  the morning.  He will be discharged home if he remains clinically stable  and will follow up with Dopplers and ABIs on return office visit.  He  will be treated with aspirin and Plavix.      Nanetta Batty, M.D.  Electronically Signed     JB/MEDQ  D:  07/28/2009  T:  07/28/2009  Job:  [id number]   cc:   [facility]  Sanda Linger, MD

## 2011-02-08 NOTE — H&P (Signed)
[name], [name]               ACCOUNT NO.:  [id number]   MEDICAL RECORD NO.:  [id number]          PATIENT TYPE:  INP   LOCATION:  2920                         FACILITY:  [facility]   PHYSICIAN:  Peggye Pitt, M.D. DATE OF BIRTH:  1961/04/22   DATE OF ADMISSION:  06/01/2008  DATE OF DISCHARGE:                              HISTORY & PHYSICAL   PRIMARY CARE PHYSICIAN:  Bertram Millard. Hyacinth Meeker, M.D.   CHIEF COMPLAINT:  Shortness of breath.   HISTORY OF PRESENT ILLNESS:  [name] is a 50 year old African  American man with history of paranoid schizophrenia who was brought here  today because of shortness of breath.  He was found to be in DKA with a  bicarb of 8, anion gap of 15, and we are called to admit.  Unfortunately, I am unable to obtain history from the patient, all he  does is he repeats over and over again am I a diabetic, am I a diabetic  and will not answer my questions.   ALLERGIES:  He has no known drug allergies.   PAST MEDICAL HISTORY:  Significant for,  1. Schizophrenia.  2. Chronic renal insufficiency with an unknown baseline creatinine.  3. History of pneumonia status post intubation in January 2009.   HOME MEDICATIONS:  1. Clozaril 200 mg in the morning, 200 mg at night.  2. Coreg 12.5 mg p.o.  3. Paxil 60 mg p.o.  4. Xanax 1 mg p.o. p.r.n.  5. Protonix 40 mg daily.  6. Vicodin of 5/500 mg p.o. p.r.n.   SOCIAL HISTORY:  I am unable to obtain at this time as well as family  history and review of systems.   PHYSICAL EXAMINATION:  VITAL SIGNS:  Upon admission, blood pressure  153/85, heart rate 103, respirations 28, O2 sat 100% on 2 liters with a  temperature of 97.4.  GENERAL:  He is awake, is not responding to questions, however, has  echolalia and just repeats same words that I tell him.  HEENT:  Normocephalic, atraumatic.  His pupils are equal, reactive to  light and accommodation with intact extraocular movements.  He has  extremely dry mucous  membranes.  NECK:  Supple with no JVD.  No lymphadenopathy.  No bruits or goiter.  LUNGS:  Clear to auscultation bilaterally.  HEART:  Regular rhythm but tachycardiac with no murmurs, rubs, or  gallops auscultated.  ABDOMEN:  Soft, nontender, nondistended with positive bowel sounds.  EXTREMITIES:  No edema, positive pulses.   LABORATORY DATA:  Labs upon admission, sodium 137, potassium 5.4,  chloride 114, bicarb 8, BUN 32, creatinine 1.4, glucose of 570, anion  gap of 15, large serum ketones.  WBC 17.4 with an ANC of 15.1,  hemoglobin of 18.4, and platelets of 125, lipase of 42.  UA with greater  than 80 ketones, greater than a 1000 glucose, and moderate blood.  A  chest x-ray with no acute disease.   ASSESSMENT AND PLAN:  1. Diabetic ketoacidosis.  We will proceed with aggressive IV fluid      hydration.  We will start on insulin of a bolus  of 0.1 unit/kg and      then maintenance drip of 0.1 unit per kg as well.  We will check      CBGs every hour, once CBG reaches 250, we will start on D5.  We      will check BMETs every 2 hours to monitor electrolytes and      bicarbonate.  Once gap has closed, we will place on subcutaneous      insulin and bridge with a drip for an hour.  This is a patient who      will likely need insulin upon DC.  2. For his schizophrenia, we will have to hold all his medications      since I will initially place him n.p.o. for his DKA management.      Once he is on p.o., we will restart his psychiatric meds.  3. For prophylaxis.  Gastrointestinal prophylaxis, I will place him on      Protonix and for deep venous thrombosis prophylaxis, we will place      on subcutaneous heparin.      Peggye Pitt, M.D.  Electronically Signed     EH/MEDQ  D:  06/01/2008  T:  06/02/2008  Job:  [id number]

## 2011-02-08 NOTE — Discharge Summary (Signed)
[name], [name] NO.:  [id number]   MEDICAL RECORD NO.:  [id number]          PATIENT TYPE:  INP   LOCATION:  4714                         FACILITY:  [facility]   PHYSICIAN:  Willow Ora, MD           DATE OF BIRTH:  1960/10/16   DATE OF ADMISSION:  12/18/2008  DATE OF DISCHARGE:  12/21/2008                               DISCHARGE SUMMARY   PRIMARY CARE PHYSICIAN:  Sanda Linger, MD, [facility] in [place].   BRIEF HISTORY AND PHYSICAL:  [name] is a 50 year old gentleman with  history of COPD and schizophrenia who presented to the hospital with  shortness of breath.  He also had low-grade fever of 101.1 in the  emergency department.  On presentation, he was fairly tachycardic and  dyspneic.  After he received nebulizer treatment, he quickly improved  and he was then admitted for further care.   PHYSICAL EXAMINATION:  VITAL SIGNS:  Temperature 101.1, blood pressure  142/83, pulse initially 111 and now is down to 78, respirations 20, O2  sat 99% on 2 L.  LUNGS:  He had some wheezing bilaterally and without crackles.  HEART: Regular rate and rhythm with no murmurs.  ABDOMEN:  Soft and somewhat distended, but not tender.   LABORATORY DATA:  Laboratory and x-rays:  Initial CBC showed a white  count of 9.2, platelets of 123, and hemoglobin of 146.  His platelets  are noted to be chronically low.  Initial BMP show a potassium of 3.5  with a creatinine of 0.93.  LFTs were normal.  Cardiac enzymes x3 show  slightly elevated CK, but normal troponins.  Calcium was normal.  Blood  cultures at the time of discharge are pending.  Urine culture was  negative.  BMP was less than 30, hemoglobin A1c was 6.6.  TSH 2.0.  Upon  admission D-dimer was 1.75, which is elevated.  Subsequently, CT chest  angiogram showed no pulmonary emboli.  Chest x-ray showed no acute  abnormalities.   HOSPITAL COURSE:  The patient was admitted to the hospital with  worsening of his baseline  shortness of breath.  He also developed some  chest pain prior to admission.  The working diagnosis was COPD  exacerbation with acute respiratory failure.  He was started on  antibiotics as well as steroids.  Cardiac enzymes were negative.  His  clinical status gradually improved.  On rounds today, he states that he  feels a lot better, the cough and shortness of breath are back to  baseline.  Despite the fact that he has been getting p.o. prednisone  during this admission, his blood sugar remained in the lower side of the  100s.  At this point, I feel that he got maximal hospital benefit and he  will be discharged on all his routine medicines.   DISCHARGE INSTRUCTIONS:  As follows:  1. Coreg 12.5 twice a day.  2. NovoLog insulin 6 units, 8 units, and 6 units for breakfast, lunch,      and dinner; Vicodin 5 mg q.4 h. as needed; Advair 250/50  twice a      day.  3. Ventolin inhaler or a nebulization four times a day as needed,      clozapine 200 mg in the morning, and clozapine 300 mg at night.  4. Xanax 3 times a day.  5. Paxil a total of 60 mg daily.  6. Vytorin 10/40 once daily.  7. Lisinopril 10 mg at night.  8. Protonix 40 mg daily.  9. Zantac 300 mg at night.  10.Flonase every night.  11.Lantus 22 units a day.  12.In addition to above medicines, he will take Augmentin 875 mg twice      a day for 5 days, Tussionex 5 mL twice a day as needed for cough,      prescription for 150 mL is provided to the patient, and also      prednisone 10 mg total of 12 tablets to be taken in the next 6      days.  13.He is advised to see his primary care doctor within the next 10      days and to come back to the hospital if he feels poorly again  14.He is to call his physician if his sugars go more than 200.   ADMITTING DIAGNOSIS:  Shortness of breath.   DISCHARGE DIAGNOSES:  1. Chronic obstructive pulmonary disease exacerbation with initial      respiratory failure resolve  2. Diabetes well  controlled, despite prednisone.  3. Hypertension.  4. Hypokalemia which is resolved.  5. Chest pain with cardiac enzymes negative x3.  6. History of congestive heart failure.  7. History of schizophrenia and anxiety.  8. Tobacco abuse, for which the patient is counseled during this      admission.      Willow Ora, MD  Electronically Signed     JP/MEDQ  D:  12/21/2008  T:  12/22/2008  Job:  [id number]   cc:   Sanda Linger, MD

## 2011-02-08 NOTE — H&P (Signed)
NAMEJAHVIER, ALDEA               ACCOUNT NO.:  [id number]   MEDICAL RECORD NO.:  [id number]          PATIENT TYPE:  EMS   LOCATION:  MAJO                         FACILITY:  [facility]   PHYSICIAN:  Michaelyn Barter, M.D. DATE OF BIRTH:  Nov 20, 1960   DATE OF ADMISSION:  09/24/2007  DATE OF DISCHARGE:                              HISTORY & PHYSICAL   PRIMARY CARE PHYSICIAN:  Bertram Millard. Hyacinth Meeker, M.D. of [facility]   CHIEF COMPLAINT:  Chest pain and shortness of breath.   HISTORY OF PRESENT ILLNESS:  The patient is a 50 year old gentleman with  a past medical history of schizophrenia who states that he developed  chills Wednesday of last week.  He indicated that he felt as if he had  developed a cold.  He began to take NyQuil but indicates that his  symptoms did not respond to the medication.  Instead, his symptoms  progressed.  Over the past 3-4 days he has began to develop chest and  back pain.  He indicates that Friday and Saturday, he had an episode of  passing out.  He also indicates that he has gone on to develop  progressive shortness of breath and a cough which is productive of a  copious amount of phlegm.  In addition he complains of nausea, vomiting,  as well as subjective fevers.   PAST MEDICAL HISTORY:  1. Significant for paranoid schizophrenia.  2. Pancreatitis.  3. GERD.  4. Anxiety disorder.   ALLERGIES:  No known drug allergies.   CURRENT MEDICATIONS:  1. Paxil 30 mg p.o. b.i.d.  2. Clozaril 200 mg in the morning and 300 mg in the evening.  3. Xanax 1 mg p.o. b.i.d.   SOCIAL HISTORY:  Cigarettes; the patient started smoking cigarettes at  the age of 50.  He smokes 1-1/2 packs of cigarettes per day.  Alcohol;  the last time the patient consumed alcohol was approximately one month  ago.  He indicates that he only drinks alcohol occasionally, however,  review of prior discharge summaries indicates that the patient does have  a history of alcoholism.   Cocaine; the patient admits to using crack  cocaine in the past, but the last time he used cocaine was in February 25, 1994.   FAMILY HISTORY:  Mother has a history of hypertension as well as  Alzheimer's dementia.  Father died in 26-Feb-1967 secondary to complications  from being stung by a bee.   REVIEW OF SYSTEMS:  Positive for chest pain, positive for shortness of  breath.  All other systems as per HPI.   PHYSICAL EXAMINATION:  GENERAL:  The patient is awake.  He is  cooperative.  He uses additional accessory muscles to breathe.  He is  tachypneic.  VITAL SIGNS:  Temperature 98.0, blood pressure 121/76, heart rate 137,  respirations 22, O2 saturations 90%.  HEENT:  Normocephalic and atraumatic.  Muddy sclerae bilaterally.  Oral  mucosa is pink, no thrush, and no exudates.  All of the patient's teeth  are missing.  NECK:  Supple.  No lymphadenopathy and no thyromegaly.  HEART:  Heart sounds are  distant, but S1 and S2 can be appreciated.  LUNGS:  The patient has loud rhonchi auscultated bilaterally throughout  both lungs.  ABDOMEN:  Slightly distended, soft, and nontender.  Positive bowel  sounds x4 quadrants.  No hepatosplenomegaly appreciated.  No masses are  palpated.  EXTREMITIES:  No distal leg edema.  NEUROLOGY:  The patient is alert and oriented x3.  Cranial nerves II-XII  grossly intact excluding the gag reflex which was not assessed.  MUSCULOSKELETAL:  5/5 upper and lower extremity strength.   LABORATORY DATA:  CK-MB POC 2.6, troponin I POC less than 0.05.  BNP is  less than 30. Lipase 13.  Sodium 132, potassium 3.9, chloride 93, CO2  26, glucose 174, BUN 21, creatinine 1.53.  Bilirubin total is 1.7.  Alkaline phosphatase 63, SGOT 39, SGPT 30.  Total protein 7.0, albumin  2.7.  Calcium 8.3.   The patient had a chest x-ray completed which reveals extensive right-  sided pneumonia with no definite pneumonia on the left.   ASSESSMENT:  1. Community acquired pneumonia.  We will start the  patient on empiric      IV Rocephin and Azithromycin.  We will check sputum for gram stain,      culture, and sensitivity as well as blood cultures x2.  We will      provide the patient with oxygen as well as start the patient on      nebulized breathing treatments.  We will also confirm the patient's      HIV status.  2. Chest pain.  This is most likely secondary to the patient's      pneumonia.  However, we will rule the patient out by checking his      cardiac enzymes including his troponin I x3 as well as CK-MB x3 q.8      hours apart. Will also order a CT of the patient's chest after      hydrating him, in light of his renal insufficiency.  3. History of schizophrenia.  We will resume the patient's previously      prescribed medications.  4. Hyponatremia.  This is mild.  We will provide the patient with 0.9      normal saline.  5. Renal insufficiency.  The acuity of this is questionable.  This      more than likely is prerenal.  It may be secondary to dehydration.      We will gently hydrate the patient for now and monitor his BUN and      creatinine closely.  6. Questionable syncopal episode.  The trigger for this is      questionable.  Whether or not dehydration played a role, versus an      infectious process contributing is questionable.  We will monitor      the patient for now.  May consider additional studies after the      patient is admitted into the hospital.  7. Hypoalbuminemia.  This may be secondary to protein calorie      malnutrition, although, the patient does appear to be malnourished.      We will check a prealbumin and consider further workup.  8. Gastrointestinal prophylaxis.  We will provide Protonix.  9. Deep venous thrombosis prophylaxis.  We will provide Lovenox.      Michaelyn Barter, M.D.  Electronically Signed     OR/MEDQ  D:  09/24/2007  T:  09/24/2007  Job:  [id number]   cc:   Bertram Millard. Hyacinth Meeker,  M.D. 

## 2011-02-08 NOTE — H&P (Signed)
[name], [name]               ACCOUNT NO.:  [id number]   MEDICAL RECORD NO.:  [id number]          PATIENT TYPE:  INP   LOCATION:  3731                         FACILITY:  [facility]   PHYSICIAN:  Sheliah Mends, MD      DATE OF BIRTH:  [birth year]   DATE OF ADMISSION:  09/01/2008  DATE OF DISCHARGE:                              HISTORY & PHYSICAL   IDENTIFICATION:  A 50 year old gentleman with chest pain.   HISTORY OF PRESENT ILLNESS:  This is a 50 year old gentleman who  presented with complaints of chest pain to the emergency department at  [facility].  The patient complained of 2 hours of continuous  chest pressure and chest pain while watching TV.  He complained of  associated shortness of breath, which worsens while lying down.  There  is no radiation noted.  There is no associated nausea, vomiting, and  diaphoresis reported.  The chest pain improved somewhat with  nitroglycerin, but did not resolve.   EMERGENCY DEPARTMENT COURSE:  The patient was admitted and evaluated in  the emergency department.  His initial set of cardiac biomarkers, BNP  levels, and EKG were negative for evidence of ischemia or heart failure.  The patient has multiple risks factors for coronary artery disease  including tobacco dependence and type 2 diabetes mellitus and the  decision was made to admit him for further risk stratification.   PAST MEDICAL HISTORY:  1. COPD.  2. Pneumonia requiring mechanic ventilation in 2009.  3. Type 2 diabetes mellitus.  4. Schizophrenia.  5. Chronic renal insufficiency.   HOME MEDICATION:  1. Clozaril 200 mg in the morning and at night.  2. Coreg 12.5 mg p.o. b.i.d.  3. Paxil 60 mg p.o. daily.  4. Xanax 1 mg p.o. p.r.n.  5. Protonix 40 mg p.o. daily.  6. Vicodin 5/500 mg p.o. t.i.d. p.r.n.  7. Advair Diskus.  8. NovoLog.  9. Lantus.   SOCIAL HISTORY:  The patient is single and has no children.  He lives on  social security disability.  He has a  history of tobacco use as well as  cocaine use in the past.   FAMILY HISTORY:  Noncontributory.   REVIEW OF SYSTEMS:  Positive as above; otherwise, 12-point review of  systems negative.   PHYSICAL EXAMINATION:  GENERAL:  The patient is alert and looks  comfortable.  VITAL SIGNS:  Blood pressure 116/72, heart rate 80, respiration rate 18,  and temperature 97.8.  NECK:  Supple.  Normal JVP.  No carotid bruit.  CHEST AND LUNG:  Clear to auscultation bilaterally.  No rales or  wheezes.  HEART:  Regular rate and rhythm.  No rub, murmur, or gallop.  ABDOMEN:  Soft, nontender, and nondistended.  Positive bowel sounds.  EXTREMITIES:  No edema.   IMAGING:  EKG shows normal sinus rhythm at a rate of 83 beats per  minute.  Left axis deviation and no ST-T segment changes suggestive of  myocardial ischemia.   LABORATORY DATA:  Myoglobin 47, CK-MB less than 1, and troponin less  than 0.5.  The chemistry panel within  normal limits.   IMPRESSION:  1. Chest pain concerning for cardiac etiology/acute coronary syndrome.  2. Type 2 diabetes mellitus.  3. Tobacco dependence.  4. Paranoid schizophrenia.   PLAN:  [name] will be admitted to the hospital and followed with  serial EKGs and cardiac enzymes.  For further risk stratification, he  will be scheduled for a dobutamine myocardial perfusion scan.  Alternatively, a cardiac CT could be considered.  Currently there is no  evidence for a acute coronary event.  An Hb1Ac and lipid profile will be  obtained during the admission and to follow up on the patient's risk  factor management.   The patient will be admitted as a full code.   Thank you for allowing me to assist in the care of this patient.      Sheliah Mends, MD  Electronically Signed     JE/MEDQ  D:  09/02/2008  T:  09/02/2008  Job:  [id number]

## 2011-02-08 NOTE — Discharge Summary (Signed)
[name], [name]               ACCOUNT NO.:  [id number]   MEDICAL RECORD NO.:  [id number]          PATIENT TYPE:  INP   LOCATION:  3031                         FACILITY:  [facility]   PHYSICIAN:  Mobolaji B. Bakare, M.D.DATE OF BIRTH:  [birth year]   DATE OF ADMISSION:  09/24/2007  DATE OF DISCHARGE:  10/19/2007                               DISCHARGE SUMMARY   PRIMARY CARE PHYSICIAN:  Bertram Millard. Hyacinth Meeker, M.D.   FINAL DIAGNOSES:  1. 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  2. Necrotizing pneumonia/community acquired pneumonia.  3. Delirium/agitation.  4. History of paranoid schizophrenia.  5. 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  6. Hyperglycemia.  7. Normocytic anemia.  8. Renal insufficiency.  9. Prolonged QTC interval.   CONSULTATIONS:  1. Pulmonary and critical care consult provided by Nelda Bucks,      MD, and colleagues.  2. Psychiatric consult provided by Antonietta Breach, M.D.  3. Infectious disease consult provided by Fransisco Hertz, M.D.   PROCEDURES:  1. Chest x-ray done on September 24, 2007, showed extensive right-sided      air space disease compatible with pneumonia.  2. CT angiogram of the chest done on September 25, 2007, showed      necrotizing right upper lobe pneumonia.  3. Left central line inserted on September 26, 2007, now discontinued      on October 08, 2007.  4. CT scan of the chest done on September 30, 2007, showed progression      of necrosis and cavitation within the consolidated right lower      lobe, progression of involvement of both upper lobes and left lower      lobe were also noted.  There was a tiny right pleural effusion.  5. Multiple chest x-rays and abdominal x-rays were done serially to      monitor patient's progress.  6. A 2-D echocardiogram  done on September 25, 2007, showed left      ventricular systolic function to be vigorous with estimated      ejection fraction of 65-75%.   There was no diagnostic  left      ventricular regional wall motion abnormalities.  There was      increased ventricular wall thickness moderately and increased      relative contribution of atrial contraction to left ventricular      filling.  The left atrium was mildly dilated.  Sinus tachycardia      was noted during that evaluation.  7. Bronchoscopy was done on October 02, 2007.  Findings were extensive      secretions, tan, thick in right upper lobe, left lower lobe.      __________ left 0.5 cm nodular  erythematous lesion posterior wall      probably from trauma due to functioning vessels inflammatory versus      malignancy.  Specimens were sent.  There was no growth and cytology      was negative for malignancy.  8. Tracheostomy done on October 05, 2007, by Dr. Tyson Alias.   BRIEF HISTORY:  [name] is a 50 year old African American male with  past medical history significant for paranoid schizophrenia,  pancreatitis, gastroesophageal reflux disease, anxiety disorder, history  of drug abuse (cocaine) in the past, history of alcoholism.  He was in  his usual state of health until a three to four days prior to admission  when he developed chest pain, cough, shortness of breath, and fever.  He  came to the emergency room.  On initial evaluation, his temperature was  98, blood pressure 121/76, he was tachycardic with a heart rate of 127,  oxygen saturation was 90% on room air.  Initial laboratory data revealed  a white cell count of 17,000.  He had an ABG which showed pH of 7.45,  pCO2 of 32 and pO2 of 67 with O2 saturations of 93% on 3 liters of  oxygen.  Chest x-ray showed extensive right-sided pneumonia.  He was  admitted for community acquired pneumonia.  Blood cultures were drawn.  He was started on Rocephin and Zithromax.   It was also noted that patient had mild hyponatremia with sodium of 132  and mild renal insufficiency with BUN 21 and creatinine 1.53 on  admission.    HOSPITAL COURSE:  COMMUNITY ACQUIRED PNEUMONIA/VENTILATOR DEPENDENT  RESPIRATORY FAILURE:  Patient was admitted to a regular floor and  continued on antibiotics.  He continued to spike fever up to 102-103 on  the first day of admission.  On the night of September 26, 2007, patient  went into severe acute respiratory distress.  He was hypoxic and  hypoxemic.  He was intubated and transferred to the intensive care unit.  At this point, he was managed by the pulmonary and critical care team.  Antibiotics were switched to vancomycin, Zosyn, Levaquin (for broader  pseudomonas coverage).  He had repeat blood cultures, urine culture and  bronchoalveolar leverage.  All these cultures turned out negative.   Patient required prolonged ventilation.  A follow-up CT scan of the  chest was done on September 30, 2007, results as noted above.  At this  point, patient continued to spike fever and there was concern for  abscess.  Fortunately, there was no abscess on this repeat CT scan but  it did show more extensive and progressive necrosis.  Hence, he  underwent bronchoscopy on October 02, 2007, and result is as noted  above.  Specimens were sent for AFB, fungal culture, bacterial culture,  cytology; all these turned out negative.  Blood cultures were also  negative.  Sputum cultures negative.  He had PPD placed which was  negative.  Legionella antigen negative.  Urine Streptococcal antigen was  positive.  Infectious disease was therefore consulted.  Recommendation  was to discontinue vancomycin and continue patient on Zosyn, Levaquin  and Foltx as there was no MRSA growing.  Hence, vancomycin was  discontinued.  Patient made very slow progress.   His hospital course was marred by agitation.  He needed to be  extensively sedated.  As noted, the patient has history of paranoid  schizophrenia and was on Clozaril.  This was restarted on admission,  however, it was discontinued temporarily because of fever  which was  thought could be due to neuroleptic malignant syndrome.  He was given  Klonopin, Haldol p.r.n. to help with agitation.  He was on fentanyl  infusion.  It was felt necessary to start on methadone to help reduce  the fentanyl dose.  Patient was therefore started on methadone 20 mg  q.6h.  This  has been tapered down.  It is noted that this QTC interval  has increased.  At the time of dictation, QTC interval is 517.  Psychiatric consult was obtained to help manage agitation.  Patient was  felt to have psychotic disorder not otherwise specified and mood  disorder by Dr. Jeanie Sewer.  He recommended restarting Clozaril at 100 mg  nightly and increased as patient tolerated by 50 mg per day to a maximum  of 200 mg in a.m. and 300 mg nightly.  Risperdal was discontinued and  Haldol was used p.r.n.  Paxil was restarted at 10 mg daily, to be  titrated up to 10 mg per day to 40 mg daily.  Klonopin is to be tapered  off.  Patient underwent tracheostomy on the 9th of January 2009, for prolonged  intubation.  Subsequent to this, attempts were made at weaning.  Initial  attempts were unsuccessful.  Eventually, he was successfully weaned off  on October 11, 2007.  He is currently on tracheal collar and saturating  at 98%.  Patient was somewhat somnolent due to the Klonopin.  This has  been reduced with goal of tapering off.   FEEDING:  Patient was started on tube feeding during the course of  intubation.  He is currently still getting tube feeding.  He was not  awake enough for modified barium swallow three days ago.  Patient is now  awake and will be able to cooperate.  He should have a modified barium  swallow done and thereafter recommendation to be given for feedings.  At  the moment, he is on Jevity 1.2 at 85 mL per hour.   He has a tracheostomy with #6 Shiley and also a PMV speaking valve.  Recommendation by speech therapist is to use PMV during all waking hours  with cough deflated.   Oral care three times a day.  Further evaluation  for decannulation per physicians at [facility]-term acute care facility.   RENAL INSUFFICIENCY:  This has improved remarkably.  It is felt to be  secondary to acute illness.  BUN and creatinine at the time of discharge  was 17/0.7.  Patient is having good urine output.   HYPERGLYCEMIA DUE TO ACUTE ILLNESS:  This was managed with NovoLog.  Patient is not known to be diabetic.  Hemoglobin A1c is 6.2 on October 18, 2007.   PROLONGED QTC INTERVAL:  This is felt to be secondary to medications,  particularly Haldol, Risperdal and methadone.  QTC interval on October 10, 2007, was 473.  QTC interval on October 18, 2007, was 517.  Methadone is being tapered off.   Delirium is improving.  Patient is oriented to person and place but not  particularly to time.  He is less agitated and he has been able to  ambulate with the physical therapist.  He obeys commands.   VENTILATOR DEPENDENT RESPIRATORY FAILURE:  Patient underwent serologic  evaluation for vasculitis including ANCA antinuclear antibody, these  were negative.  HIV serology, PCP smear were also negative.   CONDITION ON DISCHARGE:  He is hemodynamically stable.  He has completed  antibiotics on October 18, 2007.  He needs rehabilitation, decannulation  of the tracheostomy and assessment for feeding.   DISCHARGE VITAL SIGNS:  Temperature 98.9, pulse 104, respiratory rate  20, blood pressure 146/88, O2 saturation 98% on trache collar.   DISCHARGE MEDICATIONS:  1. Aspirin 81 mg daily.  2. Klonopin 1.5 mg p.o. b.i.d. for two days, then 1 mg p.o.  b.i.d. for      two days, then 0.5 mg p.o. b.i.d. for two days and stop.  3. Clozaril 100 mg nightly and increase as tolerated by 50 mg per day      to 200 mg in a.m. and 300 mg nightly.  4. Paxil 20 mg daily for three days, then 30 mg daily for three days,      then increase to 40 mg daily if patient tolerates.  5. Sliding scale insulin with NovoLog  q.a.c.  6. Lovenox 40 mg subcutaneous daily until patient is quite ambulatory.  7. Methadone 10 mg per 5 mL solution, give 2.5 mL b.i.d. (5 mg b.i.d.      for five days, then reduce to 1.25 mL for five days, then      discontinue).  8. Protonix 40 mg daily.  9. Tylenol 650 mg q.4h. p.r.n.  10.Ventolin 2.5 mg inhalation q.4h. p.r.n.  11.Xanax 0.5 mg q.6h. p.r.n.  12.Haldol 2.5 mg IV or p.o. q.4h. p.r.n.  13.Vicodin one to two q.4h. p.r.n. pain.   PERTINENT LABORATORY DATA:  HIV negative.  Herpes simplex virus I and  II, PCR was negative.  Hemoglobin A1c 6.2.  Anemia panel:  Iron 10, TIBC  282, vitamin B12 655, folate 14.5, ferritin 476.  Neutrophil,  cytoplasmic antibody normal.  Antinuclear antibody negative.  Compliment  4 and compliment 3 were within normal.  C reactive protein was 214.  Sed  rate 95.  Fungal smear was negative for yeast.  Sed rate 97.  Urine drug  screen was negative for cocaine.  Pneumocystis carinii pneumonia smear  was negative.  HIV serology nonreactive.  Sodium 139, potassium 3.7,  chloride 103, CO2 27, glucose 130, BUN 17, creatinine 0.70, calcium 8.9.  Liver enzymes were normal.  AST 18, ALT 21, albumin 2.5.   RECOMMENDATIONS:  1. Follow-up EKG to ensure stability of QTC interval.  2. Speech therapist to evaluate for swallowing and further      recommendations.  3. Further decannulation of physicians at [facility].  4. Rehabilitation with physical and occupational therapists.   DIET:  Jevity 1.2 at 85 mL per hour via Panda tube.      Mobolaji B. Corky Downs, M.D.  Electronically Signed     MBB/MEDQ  D:  10/19/2007  T:  10/19/2007  Job:  [id number]   cc:   Bertram Millard. Hyacinth Meeker, M.D.

## 2011-02-08 NOTE — Assessment & Plan Note (Signed)
[facility]                                 ON-CALL NOTE   NAME:[name], Mano                      MRN:          [id number]  DATE:02/14/2009                            DOB:          01/06/1961    Time received is 11:38 p.m.  The caller is the same.  He sees Dr. Yetta Barre.  Telephone [phone].  The patient has a question about his nebulizer.  He says he has COPD and  uses an albuterol nebulizer at times along with his inhaler.  He has  been a bit tight and wheezing today.  He took a nebulizer treatment at  6:30 this evening and then a Ventolin inhaler at 9.  He wants to know if  he can use his nebulizer again now.  I told him to go ahead and use his  nebulizer every 4 hours as needed.  If he gets any tighter this evening,  he can let me know.     Tera Mater. Clent Ridges, MD  Electronically Signed    SAF/MedQ  DD: 02/15/2009  DT: 02/16/2009  Job #: [id number]

## 2011-02-11 NOTE — Discharge Summary (Signed)
[name], [name]                         ACCOUNT NO.:  [id number]   MEDICAL RECORD NO.:  [id number]                   PATIENT TYPE:  INP   LOCATION:  3714                                 FACILITY:  [facility]   PHYSICIAN:  Nanetta Batty, M.D.                DATE OF BIRTH:  1961-09-09   DATE OF ADMISSION:  DATE OF DISCHARGE:  10/22/2002                                 DISCHARGE SUMMARY   DISCHARGE DIAGNOSES:  1. Chest pain, negative myocardial infarction.  2. Pancreatitis, resolved.  3. Alcohol abuse.  4. Schizophrenia.  5. Dyslipidemia.   CONDITION ON DISCHARGE:  Improved.   PROCEDURES:  No interventional procedures.   DISCHARGE MEDICATIONS:  1. Lopressor 25 mg b.i.d.  2. Nexium 40 mg daily.  3. Enteric coated aspirin 81 mg daily.  4. Nicotine patch 14 mg will remove old and apply new patch every morning.  5. Folic acid daily.  6. Multivitamin daily.  7. Xanax 2 mg daily as before.  8. Trazodone 300 mg q.h.s.  9. Effexor 150 mg daily.  10.      Loxitane 2 mg q.i.d.  11.      Seroquel 200 mg b.i.d.  12.      Paxil 30 mg b.i.d.   DISCHARGE INSTRUCTIONS:  Activity as tolerated. Low fat, low salt diet. Do  not smoke with nicotine patch or you may have a heart attack.   FOLLOW UP:  Followup with Dr. Jenne Campus on November 12, 2002, at 11: a.m.   HISTORY OF PRESENT ILLNESS:  The patient is a 50 year old African American  male with no prior cardiac history but a positive history of schizophrenia,  was admitted on October 19, 2002, with chest pain. The pain radiated to the  upper back, left shoulder blade. The left arm felt numb and the left leg  became weak. He felt clammy, diaphoretic and short of breath and nauseated.  He also had mild abdominal discomfort. He states he had a similar episode 1  to 2 months prior to  this admission and over the period of the prior 2 days  to admission since this became worse.   PAST MEDICAL HISTORY:  1. Reflux disease.  2. Paranoid  schizophrenia.  3. Anxiety.  4. Poor historian.   FAMILY HISTORY:  Positive for coronary disease. His mother had an MI. His  father died at 2 of MI. He has a family history of diabetes.   SOCIAL HISTORY:  He lives with his mother. He is disabled. He smokes. He  denies alcohol or drug use. He is not married and does not have children.   REVIEW OF SYSTEMS:  See history and physical.   PHYSICAL EXAMINATION:  At discharge:  GENERAL:  An alert and oriented African American male in no acute distress.  VITAL SIGNS:  Blood pressure 108/60, pulse 78, respirations 18, temperature  97.2, O2 saturation 96% on 3 liters.  HEART:  S1, S2, regular rate and rhythm.  LUNGS:  Clear without rales, rhonchi or wheezes.   LABORATORY DATA:  Hemoglobin on admission 16.5, hematocrit 47, White blood  cells 9.6, MCV 89, platelets 115, neutrophils 51, lymphocytes 39, mono 8,  eosinophils 1, basophils 1. Chemistry:  Sodium 142, potassium 3.7, chloride  112, CO2 26, glucose 98, BUN 5, creatinine 1. Calcium 8.9, total protein  5.9, albumin 3.2, AST 29, ALT 46, ALP 95, total bilirubin 0.7, direct  bilirubin less than  0.1, indirect bilirubin not calibrated. Amylase 148,  lipase 121. Prior to discharge amylase was back down to 56. Cardiac enzymes:  CK 228, 159, MB negative at 2.7 and 1.8, troponin I less than  0.01 x3.  Lipids: Cholesterol 219, triglycerides 284, HDL 35 and LDL 127. Drug screen  was positive for benzodiazepines. A urinalysis was clear.   Cardiolite study was done which revealed no evidence of ischemia or scar,  some encroachment upon the inferior wall by bowel contents on both the rest  and stress studies, EF 45%. Chest x-ray mild vascular crowding at the lung  bases. Vague density on the right infrahilar region. An EKG on admission,  sinus tachycardia, heart rate 107, right superior axis deviation. Followup  revealed sinus rhythm, heart rate 90, possible inferior infarct, age  undetermined.    HOSPITAL COURSE:  The patient was admitted on October 19, 2002, with chest  pain, rule out cardiac disease, abnormal pancreas enzymes, probable  pancreatitis. The patient was admitted to the floor, telemetry and placed on  IV nitroglycerin. His pain resolved and nitroglycerin was eventually weaned  off  during the hospital stay.   Persantine Cardiolite was done which was negative, and by October 22, 2002,  he was  ready for discharge to home. Negative Cardiolite study. He will  followup as an outpatient with Dr. Jenne Campus. Discharged on beta blocker and  aspirin.      Darcella Gasman. Merilynn Finland, M.D.   LRI/MEDQ  D:  11/28/2002  T:  11/29/2002  Job:  [id number]   cc:   Lorelle Formosa, M.D.  [phone] [address]  [place]  [place] [address]  Fax: [phone]

## 2011-02-11 NOTE — H&P (Signed)
[name], [name]               ACCOUNT NO.:  [id number]   MEDICAL RECORD NO.:  [id number]          PATIENT TYPE:  IPS   LOCATION:  0403                          FACILITY:  [facility]   PHYSICIAN:  Jasmine Pang, M.D. DATE OF BIRTH:  Mar 14, 1961   DATE OF ADMISSION:  02/18/2006  DATE OF DISCHARGE:                         PSYCHIATRIC ADMISSION ASSESSMENT   IDENTIFYING INFORMATION:  This is a 50 year old single African-American  male.  The patient reports having overdosed on Paxil and trazodone.  He  states that he did not want to live anymore.  Curiously, his alcohol level  was 138.  He states that this is the first time he has had an alcohol in the  past 3 years.  They unfortunately did not do a urine drug screen.  [name] likes to use cocaine as well, and in fact when last here in April  that was what he was abusing in April.  I know [name] for many years  and he is always abusing substances, always reporting auditory  hallucinations, and always noncompliant with his medications.  Indeed when  last here, April 3 to April 18 he was started on Clozaril.  He reports that  he was getting his Clozaril at [facility], however they say au contraire, he  never picked up Clozaril there, so he has been noncompliant with the  Clozaril as well, and in fact he has not picked up any medications since  March.  So he has had no Xanax, Paxil, or trazodone from [facility].   PAST PSYCHIATRIC HISTORY:  His last admission here as already stated was a  couple of weeks ago, April 3 to April 18, and at that time he was reporting  auditory hallucinations commanding him to get cocaine and inject it into  himself.  He stated that the voices have been quite intense, that he  normally does not use cocaine.  Due to his many failures on anti-psychotics,  he was started on Clozaril.  However he did not follow up with that.   SOCIAL HISTORY:  He  states that he went to school for 14 years, that he has  an  Scientist, research (physical sciences) in data processing.  He never married, he has no  children.  He states he has been receiving social security disability since  about 2000,  and currently he is living with his mother.   FAMILY HISTORY:  He denies.   ALCOHOL AND DRUG ABUSE:  He stated that he did in fact use cocaine  yesterday, the day of admission, and he also acknowledged having used  alcohol.  He does smoke, 2 packs per day.   PAST MEDICAL HISTORY:  His primary care Tyheem Boughner is Dr. Shana Chute.  He denies  any medical issues.  He states he just has mental problems.   MEDICATIONS:  As far as I can tell he has been noncompliant with medications  since his last pickup in March, which was March 16 at [facility].   ALLERGIES:  No known drug allergies.   POSITIVE PHYSICAL FINDINGS:  PHYSICAL EXAMINATION:  His abdomen is quite  protuberant.  He has had hepatitis in the past, pending cirrhosis, and I  will get some liver functions on him and an abdominal ultrasound.  Vital  signs show he is 6 feet 2 inches, weighs 212 pounds.  Temperature is 98.2,  blood pressure is 125/76 to 114/65.  Pulse is 84, respirations at 18.  The  remainder of his physical examination is unremarkable and as per the ER.   MENTAL STATUS EXAM:  Today, he is alert and oriented x3.  He looks  unhealthy.  He is walking unaided. His eye contact is moderate.  His speech  is normal for him.  His mood is somewhat depressed, but he laughs.  It is  kind of his normal presentation actually.  His affect is congruent.  He has  a range.  His anxiety level:  He reports panic attacks, however he is not  appearing anxious at all.  Thought process is coherent, relevant, goal  oriented.  Judgment and insight are poor, concentration and memory at  intact.  Intelligence is at least average.  Today he is not suicidal or  homicidal, he is not responding to auditory or visual hallucinations and he  is not reporting them at this moment.   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  AXIS II:  No diagnosis at this time.  AXIS III:  Rule out liver cirrhosis or hepatitis.  AXIS IV:  Severe, chronic mental illness.  AXIS V:  Global assessment of function is 10.   PLAN:  Plan is to give supportive care and to assess what would be the best  medication management.      Mickie Leonarda Salon, P.A.-C.      Jasmine Pang, M.D.  Electronically Signed    MD/MEDQ  D:  02/18/2006  T:  02/18/2006  Job:  [id number]

## 2011-02-11 NOTE — Discharge Summary (Signed)
NAME:  [name], [name]                         ACCOUNT NO.:  [id number]   MEDICAL RECORD NO.:  [id number]                   PATIENT TYPE:  IPS   LOCATION:  0301                                 FACILITY:  [facility]   PHYSICIAN:  Geoffery Lyons, M.D.                   DATE OF BIRTH:  07-May-1961   DATE OF ADMISSION:  06/18/2002  DATE OF DISCHARGE:  06/27/2002                                 DISCHARGE SUMMARY   CHIEF COMPLAINT AND PRESENT ILLNESS:  This was one of several admissions to  [facility] for this 50 year old single African-American  male voluntarily admitted.  He felt that he was tired of living and the  voices were getting worse.  He came complaining of increased auditory  hallucinations, telling him you're no good; you're better off dead,  becoming frightened and he was having a strong urge to overdose on his  medication.  Reports he has strong suicidal ideation as he felt that his  mood changed overnight, tired of living and taking medications.  Has not  slept well, increased anxiety, says his mother went for minor surgery,  increased thoughts of suicide.  He was looking forward to _____ health day  program but all this started happening and he started feeling worse.   PAST PSYCHIATRIC HISTORY:  Sees [facility] at [facility].  Seen  previously by Dr. Waymon Budge and Mallie Darting, P.A.   ALCOHOL/DRUG HISTORY:  Denies any recent substance abuse.   MEDICATIONS:  Effexor XR 150 mg in the morning, Paxil 30 mg twice a day,  Zyprexa 10 mg at 9 in the morning, 3 p.m. and at bedtime, trazodone 100 mg  at bedtime for sleep, Loxitane 10 mg three times a day, _________ 25 mg  p.r.n. for anxiety.   PHYSICAL EXAMINATION:  Performed and failed to show any acute findings.   MENTAL STATUS EXAM:  Tall, medium-built male fully alert, cooperative,  pleasant.  Affect slightly flat and constricted.  Tremulous.  Mild pressured  speech, fairly articulate.  Speech is clear.  Mood is  depressed, anxious.  Thought content deals with concerns of being alone, hearing the voices, his  illness.  Cognition well-preserved.   ADMISSION DIAGNOSES:   AXIS I:  1. Schizoaffective disorder, depressed.  2. Polysubstance abuse, in remission.   AXIS II:  No diagnosis.   AXIS III:  Elevated liver enzymes.   AXIS IV:  Moderate.   AXIS V:  Global Assessment of Functioning upon admission 29; highest Global  Assessment of Functioning in the last year 59.   LABORATORY DATA:  CBC was within normal limits.  Blood chemistries were  within normal limits.  SGOT 38, SGPT 57.  Thyroid profile was within normal  limits.  Drug screen was negative for substances of abuse.   HOSPITAL COURSE:  He was admitted and started intensive individual and group  psychotherapy.  As he  endorsed that the medication was not really working  for him, we went ahead and discontinued the Zyprexa and started Seroquel 100  mg three times a day.  That was increased up to 200 mg three times a day and  then 200 mg twice a day and 300 mg at bedtime.  He was given trazodone for  sleep and the Loxitane was increased to 10 mg twice a day and 20 mg at  bedtime.  The Seroquel was further increased to 200 mg in the morning, at 3  p.m. and 400 mg at bedtime.  Loxitane was increased to 10 mg in the morning,  at 3 p.m. and 30 mg at bedtime.  Initially, he continued to complain of the  voices.  As we went through the medication transition, he started improving.  By June 27, 2002, he felt much better.  He was in full contact with  reality.  Endorsed that the voices were basically gone.  He was sleeping  well.  He was not worried.  He was not ruminating.  He was looking forward  to allowing the medications to work fully and get the voices in full  remission.  As he was not suicidal or homicidal and the voices were under  better control, we went ahead and discharged to outpatient follow-up.   DISCHARGE DIAGNOSES:   AXIS I:   Schizoaffective disorder, depressed.   AXIS II:  No diagnosis.   AXIS III:  Elevated liver enzymes.   AXIS IV:  Moderate.   AXIS V:  Global Assessment of Functioning upon discharge 55.   DISCHARGE MEDICATIONS:  1. Paxil 30 mg, 1 twice a day.  2. Effexor XR 150 mg daily.  3. Seroquel 200 mg, 1 twice a day and 2 at night.  4. Loxitane 10 mg three times a day and 30 mg at bedtime.  5. Trazodone 150 mg, 2 at night p.r.n. for sleep.   FOLLOW UP:  [facility].                                               Geoffery Lyons, M.D.    IL/MEDQ  D:  07/24/2002  T:  07/25/2002  Job:  [id number]

## 2011-02-11 NOTE — H&P (Signed)
NAME:  [name], [name]                         ACCOUNT NO.:  [id number]   MEDICAL RECORD NO.:  [id number]                   PATIENT TYPE:  IPS   LOCATION:  0301                                 FACILITY:  [facility]   PHYSICIAN:  Geoffery Lyons, M.D.                   DATE OF BIRTH:  11-11-1960   DATE OF ADMISSION:  06/18/2002  DATE OF DISCHARGE:                         PSYCHIATRIC ADMISSION ASSESSMENT   IDENTIFYING INFORMATION:  This is a 50 year old single African-American male  who is a voluntary admission.   CHIEF COMPLAINT:  I'm just tired of living and my voices are worse when I'm  alone.   HISTORY OF PRESENT ILLNESS:  This patient presented in our assessment  office, arriving under his own accord, complaining of increased auditory  hallucinations since Sunday with voices making remarks that you're no good;  you're better off dead.  He was becoming frightened because he was having  strong urges to overdose on his medications and decided that it would be  better to come here to the hospital instead of taking an overdose.  He also  reports that he has had strong suicidal thoughts since Saturday, feeling  like his mood changed overnight from being positive about the future with  plans and ambitions to being just tired of living and tired of taking his  medications.  He reports that he has not slept well since Saturday night and  does endorse some increased anxiety since Saturday, anticipating that his  mother was going into the hospital for some minor surgery on Sunday.  Since  Sunday, when his mother left, he has been in the house all alone and reports  that the voices are worse and he has increasing thoughts of suicide when  there is no one in the house with him.  Prior to that time, he was feeling  positive about looking forward to getting into [facility] for some  daytime diversionary activities and was in the application process for that  program.  The patient endorses suicidal  thoughts with a plan to overdose.  He reports that he is having auditory hallucinations which are somewhat  worse, especially at night, and visual hallucinations which he reports are  at baseline and had not changed for him.  These are hallucinations of a  dark figure that occur intermittently.  He does reports that, since  arriving here, he continues to have suicidal thoughts but he is able to  contract for safety and he has slept somewhat better since we have increased  his trazodone.   PAST PSYCHIATRIC HISTORY:  The patient is followed by Riki Sheer, his  psychotherapist, at the [facility] and previously by Dr. Waymon Budge, his  previous psychiatrist, and now by Dr. Mallie Darting, the physician's  assistant at the clinic there.  This is one of multiple admissions to [facility] with the last one being in  March of 2003.  The  patient has a long history of schizoaffective disorder and polysubstance  abuse and dependence.  He reports now that he has been in remission,  completely clean and sober, since March of 2003.  His urine drug screen is  currently pending.   SOCIAL HISTORY:  The patient is a single African-American male who lives  with his 78 year old mother, who has been hospitalized on Sunday for a minor  surgical procedure to her wrist.  She has since been discharged and is  staying now with the patient's sister in town and is apparently doing well.  The patient has a history of DWI in the past but denies any current or new  legal charges.  He reports that he spends most of the time in his room and  does not have much of a daily routine but his mother does require him to get  up out of bed and get in the shower and prompts him to perform his ADLs.  He  is looking forward to getting into [facility].   FAMILY HISTORY:  Father and siblings with a history of alcohol and different  types of substance abuse.   ALCOHOL/DRUG HISTORY:  Noted above.  The  patient denies any other recent  substance abuse.  No abuse of prescription medications or benzodiazepines.   MEDICAL HISTORY:  The patient is followed by Dr. Billee Cashing, who is  his primary care physician.  He denies any medical problems.   MEDICATIONS:  Effexor XR 150 mg p.o. q.a.m., Paxil (patient actually takes  30 mg b.i.d. of Paxil), Zyprexa 10 mg at 9 a.m., 3 p.m. and q.h.s.,  trazodone 100 mg q.h.s., Loxitane 10 mg t.i.d., Librium 25 mg p.r.n. for  anxiety and he reports that the most he ever uses is about three times  daily.   ALLERGIES:  None.   POSITIVE PHYSICAL FINDINGS:  The patient's physical examination is  documented in the progress notes and is generally negative.  Neuro exam is  generally nonfocal with normal motor.  Vital signs, this morning, were  temperature 98.1, pulse 113, respirations 20, blood pressure 145/79.  He is  6 feet 2 inches tall and weighs approximately 200 pounds by his report.  He  denies any recent fever, chills or signs of infection.   REVIEW OF SYSTEMS:  Remarkable for a distant past history of some blackouts  when he is abusing alcohol and sometimes cocaine but no recent dizziness.  No past history of seizures.  He does report some short-term memory  impairment which he said he has had a long time and he attributes to his  substance abuse.  No symptoms of infection.  No recent nausea, vomiting or  diarrhea.  He denies any risk factors for sexually transmitted diseases.  He  has reported that he has lost approximately 10 pounds in the past three  weeks but he is unclear as to why that would be.  It is noted that his  weight on the previous admission in March was 208 pounds.   LABORATORY DATA:  Mild leukocytosis with WBC of 11.1, hemoglobin 15.9,  hematocrit 46.5, MCV 89.6 and platelets 164,000.  The patient's metabolic panel reveals electrolytes within normal limits, BUN 12, creatinine 0.8.  His SGOT is mildly elevated at 38 with a  normal range of 0-37 and SGPT of 57  with a normal range of 0-40.  Thyroid panel is within normal limits with TSH  of 1.492 and free  T4 of 6.7.  His urinalysis and urine drug screen are  currently pending.  The patient is without any focal symptoms to explain his  leukocytosis.   MENTAL STATUS EXAM:  This is a tall, medium-built male who is fully alert  and cooperative with a pleasant affect, slightly flattened and constricted.  Subjectively, he feels tremulous, feeling that he is getting some withdrawal  without his Paxil dose this morning but generally he is cooperative and  pleasant.  He does not appear to be tremulous.  His speech does have some  very mild pressure at times.  He is generally fairly articulate, tracks  well.  Speech is clear.  Mood is somewhat depressed, mildly anxious about  his mother.  His thought content is dominated by his concerns about being  alone and his frustration with his mental illness and feeling generally  lonely without anything to do during the day.  Thought process is logical.  He tracks well.  He has a good focus.  He is positive for suicidal ideation  with a plan to overdose on medications but is able to contract for safety on  the unit.  No evidence of homicidal ideation.  He does have positive  auditory hallucinations and some vague visual hallucinations which come and  go of a dark figure.  These tend to occur when he gets anxious and also a  bit worse late in the evening or if he awakens because he is unable to  sleep.  Cognitively, he is intact and oriented x 3.  His insight is fairly  good.  Judgment fair.  Impulse control within normal limits.   DIAGNOSES:   AXIS I:  1. Schizoaffective disorder.  2. Polysubstance abuse, in remission six months.   AXIS II:  Deferred.   AXIS III:  Elevated liver enzymes.   AXIS IV:  Severe (problems related to the social environment and lack of  diversionary activities and concern about his mother).    AXIS V:  Current 29; past year 79.   PLAN:  Voluntarily admit the patient to treat his increased auditory  hallucinations and to alleviate his suicidal ideation.  His urine drug  screen is currently pending but, at this point, we do assume the accuracy of  his substance abuse in remission for the past six months.  We will continue  his Librium 25 mg q.i.d. p.r.n. for anxiety and we have elected to increase  his trazodone to 150 mg p.o. q.h.s. and last night he did get good results  with that.  We will continue his current doses of Zyprexa, Effexor and Paxil  and will ask the casemanager to assist with his [facility] application.  Meanwhile, we have elected to enroll him immediately in intensive group and  individual psychotherapy and will plan on having him follow up with Dr. Waymon Budge  in the [facility] after discharge.   ESTIMATED LENGTH OF STAY:  Four to five days.     Margaret A. Scott, N.P.                   Geoffery Lyons, M.D.    MAS/MEDQ  D:  06/19/2002  T:  06/20/2002  Job:  [id number]

## 2011-02-11 NOTE — Discharge Summary (Signed)
NAME:  [name], [name]                         ACCOUNT NO.:  [id number]   MEDICAL RECORD NO.:  [id number]                   PATIENT TYPE:  IPS   LOCATION:  0300                                 FACILITY:  [facility]   PHYSICIAN:  Geoffery Lyons, M.D.                   DATE OF BIRTH:  [birth year]   DATE OF ADMISSION:  03/12/2003  DATE OF DISCHARGE:  03/13/2003                                 DISCHARGE SUMMARY   CHIEF COMPLAINT AND PRESENT ILLNESS:  This was one of multiple admissions to  [facility] for this 50 year old single African-American  male voluntarily admitted.  Claimed that he got to the inpatient unit to get  his medications renewed.  __________ ran out of his medications.  Unable to  fulfill the appointment.  Has been overwhelmed.  Some visual hallucinations,  auditory hallucinations telling him to hurt himself.  After he was started  on medications, the first day, he denied any further hallucinations.   PAST PSYCHIATRIC HISTORY:  Third or fourth time to [facility].  All  other admissions to [facility].   ALCOHOL/DRUG HISTORY:  Used drugs up to 1994.  Reports no current use.   MEDICATIONS:  Seroquel 200 mg twice a day and 400 mg at night, Loxitane 10  mg twice a day and 30 mg in the evening, Paxil 30 mg daily, Xanax XR 2 mg in  the morning, trazodone 150 mg at night.   PHYSICAL EXAMINATION:  Performed and failed to show any acute findings.   MENTAL STATUS EXAM:  Alert, oriented, cooperative male with good eye  contact.  Pleasant, casually dressed.  Speech fluent and clear.  Appears  somewhat anxious as well.  Thought processes are coherent.  No evidence of  psychosis.  Does not appear to be responding to internal stimuli.  Good  insight and judgment.   ADMISSION DIAGNOSES:   AXIS I:  Schizoaffective disorder.   AXIS II:  No diagnosis.   AXIS III:  No diagnosis.   AXIS IV:  Moderate.   AXIS V:  Global Assessment of Functioning upon  admission 35; highest Global  Assessment of Functioning in the last year 60.   LABORATORY DATA:  CBC, blood chemistries, thyroid profile within normal  limits.  Drug screen was negative for substances of abuse.   HOSPITAL COURSE:  He was admitted and restarted on his medications.  He  endorsed no suicidal ideation, no homicidal ideation.  So we discharged to  outpatient follow-up.    DISCHARGE DIAGNOSES:   AXIS I:  Schizoaffective disorder.   AXIS II:  No diagnosis.   AXIS III:  No diagnosis.   AXIS IV:  Moderate.   AXIS V:  Global Assessment of Functioning upon discharge 55-60.   DISCHARGE MEDICATIONS:  1. Seroquel 200 mg twice a day and 2 at night.  2. Paxil 30 mg, 1 twice a  day.  3. Loxitane 10 mg, 1 twice a day and 3 at night.  4. Xanax XR 1 mg, 2 in the morning.  5. Trazodone 150 mg, 2 at bedtime.   FOLLOW UP:  Dr. Lourdes Sledge.                                               Geoffery Lyons, M.D.    IL/MEDQ  D:  04/02/2003  T:  04/02/2003  Job:  [id number]

## 2011-02-11 NOTE — Discharge Summary (Signed)
[name], [name]               ACCOUNT NO.:  [id number]   MEDICAL RECORD NO.:  [id number]          PATIENT TYPE:  IPS   LOCATION:  0403                          FACILITY:  [facility]   PHYSICIAN:  Jasmine Pang, M.D. DATE OF BIRTH:  02/05/1961   DATE OF ADMISSION:  02/18/2006  DATE OF DISCHARGE:  02/22/2006                                 DISCHARGE SUMMARY   IDENTIFYING INFORMATION:  The patient was a 50 year old single African-  American male admitted to my service on Feb 17, 2006.   HISTORY OF PRESENT ILLNESS:  The patient states that he did not want to live  anymore.  He had overdosed on Paxil and trazodone.  His alcohol level was  138.  He states that this is the first time he has had alcohol in three  years.  They did not have a urine drug screen, unfortunately.  [name]  likes to use cocaine as well, and in fact, when here last April, that was  when he was abusing.  He was known by the PA, who worked him up for many  years, and she reports he is always abusing substances.  She also stated  he always reports auditory hallucinations and is always noncompliant with  his medications.  Indeed when here, April 3 to April 18, he was started on  Clozaril.  He reports that he was getting his Clozaril at [facility].  However, they say he never picked up the Clozaril.  He apparently was  noncompliant with this medication, and the drug store states he has not  picked up any medications since March 2007.  So, he had no Xanax, Paxil, or  trazodone from [facility], as well as Clozaril.   MENTAL STATUS EXAM:  The patient presented as an alert and oriented times  three African-American male.  He looks unhealthy.  He is walking unaided.  His abdomen is swollen and appears to have a lot of ascites.  His eye  contact is moderate.  His speech is normal for him.  His mood is somewhat  depressed but he laughs.  His affect is congruent.  He has a range.  His  anxiety level:  He reports panic  attacks, however, is not appearing anxious  at all.  Thought processes were coherent, relevant, goal-oriented.  Judgment  and insight were poor.  Concentration and memory intact.  Intelligence at  least average.  The patient stated he was not suicidal or homicidal at the  time of the interview.  He denied responding to auditory or visual  hallucinations at this moment.   PAST PSYCHIATRIC HISTORY:  The patient's last admission was at [facility] a couple weeks ago, from April 3 to January 11, 2006.  At  that time, he was reporting auditory hallucinations commanding him to get  cocaine and inject it into himself.  He states that the voices also been  quite intense.  Due to his many failures on antipsychotics, he was started  on Clozaril.  However, he has not followed up with that.   SOCIAL HISTORY:  The patient states he went to school for 14 years, that he  has an associates degree in data processing.  He never married, has no  children.  He states he has been receiving social security disability since  about 2000.  Currently, he is living with his mother.   FAMILY PSYCHIATRIC HISTORY:  The patient denies.   SUBSTANCE ABUSE HISTORY:  The patient states that he did in fact use cocaine  yesterday on the day of admission, and he also acknowledged having used  alcohol.  He does smoke two packs per day cigarettes.   PAST MEDICAL HISTORY:  The patient's primary care Ravis Herne is Dr. Shana Chute.   MEDICAL PROBLEMS:  He denies any medical issues.  He states he just has  mental health problems.   MEDICATIONS:  The patient has apparently been noncompliant with his  medications since his last time picking them up in [facility] on March 16,  that he has not gotten any since then.   ALLERGIES:  No known drug allergies.   POSITIVE PHYSICAL FINDINGS:  ABDOMEN:  The patient's abdomen was quite  protuberant.  He has had hepatitis in the past, pending cirrhosis, and the  plan was to get  some liver functions on him and an abdominal ultrasound.  VITAL SIGNS:  He was 6 feet 2 inches tall.  Weight 212 pounds.  Temperature  was 98.2, blood pressure 125/76 to 114/65 when standing.  Pulse is 84,  respirations 18.  The remainder of his physical Exam was unremarkable as per  the emergency room.   DICTATION ENDED HERE.      Jasmine Pang, M.D.  Electronically Signed     BHS/MEDQ  D:  02/23/2006  T:  02/23/2006  Job:  [id number]

## 2011-02-11 NOTE — Discharge Summary (Signed)
[name], [name]               ACCOUNT NO.:  [id number]   MEDICAL RECORD NO.:  [id number]          PATIENT TYPE:  IPS   LOCATION:  0406                          FACILITY:  [facility]   PHYSICIAN:  Geoffery Lyons, M.D.      DATE OF BIRTH:  [birth year]   DATE OF ADMISSION:  03/07/2006  DATE OF DISCHARGE:  03/16/2006                                 DISCHARGE SUMMARY   CHIEF COMPLAINT AND PRESENTING ILLNESS:  This was one of several admissions  to [facility]  for this 50 year old African-American male,  single, voluntarily admitted, endorsed that the voices came back loudly,  telling him it was not worth it and he should just go ahead and kill  himself.  He planned to do it by overdosing.  He apparently has not been too  compliant with his appointments and he has also been missing some Clozapine  dosages, not sure how frequently.   PAST PSYCHIATRIC HISTORY:  One of multiple [facility] admissions.   ALCOHOL AND DRUG HISTORY:  History of cocaine abuse, none currently.   PAST MEDICAL HISTORY:  Gastroesophageal reflux disease.   MEDICATIONS:  Clozaril 100 mg in the morning, 200 at bedtime, trazodone 300  at bedtime, Paxil 30 mg twice a day, Xanax 1 mg 3 times a day as needed.   PHYSICAL EXAMINATION:  Performed, failed to show any acute findings.   LABORATORY WORKUP:  CBC:  White blood cells 8.0, hemoglobin 14.7.  Blood  chemistries: Liver enzymes SGOT 21, SGPT 20, total bilirubin 0.5.  TSH  2.033.  Clozapine level of June 13 was 169   MENTAL STATUS EXAM:  Reveals a fully alert male, prominent rhythmic jaw  movements.  Pleasant.  Speech normal production.  Mood anxious about the  situation with the voices, affect anxious, thought processes endorsing  auditory hallucinations, voices telling him to kill himself, upset with the  voices, no active delusions, endorsed no active suicidal or homicidal ideas,  just fear of doing what the voices tell him to  do. Cognition well preserved.   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  AXIS II:  No diagnosis.  AXIS III:  Gastroesophageal reflux disease, plantar warts.  AXIS IV:  Moderate.  AXIS V:  Upon admission 30, highest global assessment of functioning in the  last year 60.   COURSE IN HOSPITAL:  He was admitted and started back on his medications.  They were adjusted accordingly.  He endorsed persistent depression, lacking  of energy, lacking motivation.  We started Wellbutrin 150 mg per day.  We  adjusted Clozaril to 100 twice a day and 200 at bedtime.  The CBC was  maintained with his normal limits.  As already stated, endorsed increased  signs and symptoms of depression, more so than before.  He said he did okay  on Wellbutrin before and he was wanting to pursue it further.  He endorsed  that he was not compliant with the Clozaril for a couple of days, but for  the last  6 days he had been taking it on a regular basis.  Continued to  endorse the voices but the voices started  decreasing as we optimized  treatment with the Clozaril.  He was able to tolerate the Wellbutrin well  without any side effects.  By June 18 the voices were decreasing, still  depressed mood, improved sleep.  The voices continued to  decrease and on  June 21 he was in full contact with reality.  Endorsed he was feeling  better.  The voices were almost gone.  There were no delusions, felt much  better and was wanting to pursue the medications as he was taking them in  the unit.  Denied suicidal or homicidal ideations.   DISCHARGE DIAGNOSES:  AXIS I:  Schizoaffective disorder, cocaine abuse in  early partial remission.  AXIS II:  No diagnosis.  AXIS III:  Gastroesophageal reflux disease, plantar warts.  AXIS IV:  Moderate.  AXIS V:  Upon discharge 50.   DISCHARGE MEDICATIONS:  1.  Clozaril 100 twice a day and 200 at bedtime.  2.  Trazodone 100 3 at night.   3.  Protonix 40 mg per day.  4.  Paxil 30 mg twice a day.  5.  Wellbutrin XL 150 in the morning.  6.  Xanax 1 mg 3 times a day as needed.   DISPOSITION:  Follow up with Dr. Lolly Mustache in the outpatient clinic.   Clozapine level on June 21 706.      Geoffery Lyons, M.D.  Electronically Signed     IL/MEDQ  D:  03/30/2006  T:  03/30/2006  Job:  [id number]

## 2011-02-11 NOTE — Discharge Summary (Signed)
[name], [name]               ACCOUNT NO.:  [id number]   MEDICAL RECORD NO.:  [id number]          PATIENT TYPE:  IPS   LOCATION:  0405                          FACILITY:  [facility]   PHYSICIAN:  Geoffery Lyons, M.D.      DATE OF BIRTH:  07/03/1961   DATE OF ADMISSION:  10/03/2005  DATE OF DISCHARGE:  10/05/2005                                 DISCHARGE SUMMARY   CHIEF COMPLAINT AND PRESENTING ILLNESS:  This was one of multiple admissions  to [facility]  for this 50 year old male, single,  voluntarily admitted.  He presented with a history of suicidal ideas and  positive auditory hallucinations, voices telling him different things like  you're worthless.  Reports recent use of cocaine over the weekend.  He  states that otherwise he does not abuse any substances.  Reports he had  chest pain which brought him to the emergency room, then when he got there  he had stated I'd rather kill myself than live with this pain, which was  interpreted as suicidal ideation and which prompted the admission.  The  patient stated that otherwise he has been compliant with his medication,  feels he is on a satisfactory medication regime.   PAST PSYCHIATRIC HISTORY:  Third admission.  Last admitted 2 years ago.  Sees Dr. Lolly Mustache on an outpatient basis.   ALCOHOL AND DRUG HISTORY:  As already stated.  Recent use of cocaine, denies  any other substances.   PAST MEDICAL HISTORY:  Bronchial asthma.   PHYSICAL EXAMINATION:  Performed, failed to show any acute findings.   LABORATORY WORKUP:  Blood chemistries:  White blood cells 10.0, hemoglobin  14.8, glucose 102.  Liver enzymes:  SGOT 16, SGPT 16.  TSH: 0.313.   MENTAL STATUS EXAM:  Reveals an alert, cooperative male, good eye contact.  Speech was clear, normal rate and tone.  Affect was constricted.  Thought  process: Endorsing some auditory hallucinations although he denies any  intention to hurt himself, no active delusions, no  hallucinations.  Cognition well preserved.   ADMISSION DIAGNOSES:  AXIS I:  Schizophrenia, paranoid, cocaine abuse.  AXIS II:  No diagnosis.  AXIS III:  Asthma.  AXIS IV:  Moderate.  AXIS V:  Upon admission 35, highest global assessment of functioning in the  last year 55-60.   COURSE IN HOSPITAL:  He was admitted and started in individual and group  psychotherapy.  He wanted to sustain his medication regime as he felt it had  taken awhile to get the medications to work for him and he did not want to  mess it up, so he was placed on Loxitane 10 mg twice a day and 30 at  bedtime, Paxil 30 mg twice a day, Seroquel 200 twice a day and 400 at  bedtime, trazodone 300 mg at night, Abilify 30 mg at night.  He was pre-  animatedthat he did not imply that he was wanting to kill himself.  Denied  any suicidal ideation.  He did endorse that he used cocaine and marijuana  once.  He experienced a  panic attack, chest pain, went to the ER, thought  that he was going to die.  Implied that he went to the emergency room  because he did not want to die and that he did not have any suicidal  ideation.  Endorsed that he has not had any alcohol to drink and that he is  compliant with the medications.  January 10, he was back on his medications,  continued to endorse no suicidal ideations, said that his medications were  working for him and he was committed to continue to work with his outpatient  psychiatrist, denying any intention of abusing the cocaine or the marijuana.   DISCHARGE DIAGNOSES:  AXIS I:  Schizophrenia, paranoid type, cocaine and  marijuana abuse.  AXIS II:  No diagnosis.  AXIS III:  Asthma.  AXIS IV:  Moderate.  AXIS V:  Upon discharge 55-60.   DISCHARGE MEDICATIONS:  Loxitane 10 mg twice a day and 30 at night, Paxil 30  mg twice a day, Seroquel 200 one twice a day and two at night, trazodone 150  2 at night, Xanax 1mg  one, threetimes a day as needed for anxiety, Abilify  30 mg per  day.   DISPOSITION:  Follow up with Dr. Lolly Mustache as already scheduled, November 07, 2005.      Geoffery Lyons, M.D.  Electronically Signed     IL/MEDQ  D:  10/12/2005  T:  10/13/2005  Job:  [id number]

## 2011-02-11 NOTE — H&P (Signed)
NAME:  [name], [name]                         ACCOUNT NO.:  [id number]   MEDICAL RECORD NO.:  [id number]                   PATIENT TYPE:  IPS   LOCATION:  0300                                 FACILITY:  [facility]   PHYSICIAN:  Geoffery Lyons, M.D.                   DATE OF BIRTH:  [birth year]   DATE OF ADMISSION:  03/12/2003  DATE OF DISCHARGE:  03/13/2003                         PSYCHIATRIC ADMISSION ASSESSMENT   IDENTIFYING INFORMATION:  [age]-year-old single African-American male  voluntarily admitted on March 12, 2003.   HISTORY OF PRESENT ILLNESS:  The patient is admitted to behavioral health.  The patient states he is here to get his medications renewed.  He is in the  process of changing psychiatrists and he ran out of his medication for the  past two days.  He had an appointment, but Aleecia Tapia was unable to fulfill  that appointment at that time.  He has been overwhelmed.  Yesterday he  reports he reports he was having some visual hallucinations, auditory  hallucinations telling him to hurt himself.  Recently divorced, lives with  his father and brother.  He denies any current psychotic symptoms.  All he  needs is to be started on his medications.  They work well for him.  He  feels very anxious to go home to care for his 39 year old mother.   PAST PSYCHIATRIC HISTORY:  This is his third admission to behavioral health  center.  He sees Dr. Lourdes Sledge as an outpatient.  He was admitted to [facility].   SOCIAL HISTORY:  [age]-year-old single African-American male.  He has  no children.  This is his third psychiatric illness for the past two years.  He was arrested back in 1999 for a DUI.  He lives with his mother of 36  years.  He states he has a caretaker that helps him with meals and cleaning  assistance.   FAMILY HISTORY:  Two sisters with depression.   ALCOHOL AND DRUG HISTORY:  He smokes two packs per day.  He has used drugs  in 1994.  No current alcohol  or drug use.  Primary care Noorah Giammona is unknown  at this time.   MEDICAL PROBLEMS:  None.   MEDICATIONS:  1. Seroquel 200 mg b.i.d., 400 mg p.m.  2. Loxapine 10 mg b.i.d., 30 mg in the evening.  3. Paxil 30 mg b.i.d.  4. Xanax XR 2 mg q.a.m.  5. Trazodone 150 mg at bedtime.   DRUG ALLERGIES:  No known allergies.   PHYSICAL EXAMINATION:  This is a middle-aged, well nourished, African-  American male in no acute distress.  VITAL SIGNS:  Temperature 98.  Heart rate 109.  Respirations 20.  Blood  pressure 108/72.  He weighs 197 pounds.  HEENT:  His head is normocephalic, atraumatic.  Negative lymphadenopathy.  CHEST:  Clear to auscultation.  HEART:  Regular rate and rhythm.  ABDOMEN:  Soft and nontender.  EXTREMITIES:  Muscular strength is equal bilaterally.  Able to perform heel-  to-shin and normal alternating movements without any difficulty.   LABORATORY DATA:  CBC and CMET are within normal limits.  __________ 4.158.   MENTAL STATUS EXAM:  He is alert, oriented, cooperative male.  Good eye  contact.  Pleasant.  Casually dressed.  Speech is fluent and clear.  He is  somewhat anxious.  He appears somewhat anxious as well.  Thought processes  are coherent.  He has no evidence of psychosis.  Does not appear to  responding to internal stimuli.  His memory is intact.  Good insight and  judgment.   DIAGNOSES:   AXIS I:  Schizoaffective disorder.   AXIS II:  Deferred.   AXIS III:  None.   AXIS IV:  Psychosocial problems related to chronic mental illness.   AXIS V:  Current is 35; this past year 5.   PLAN:  Voluntary admission to behavioral health center for depression and  psychosis.  Contract for safety.  Check every 15 minutes.  Will stabilize  the mood and thinking so patient can be safe.  Will increase his  antipsychotics to target psychotic symptoms.  The patient is to follow with  Dr. Lourdes Sledge.  Will obtain an urine drug screen.  Maintain medication  compliance.   Tentative length of stay is two to three days.       Landry Corporal, N.P.                       Geoffery Lyons, M.D.    JO/MEDQ  D:  03/13/2003  T:  03/13/2003  Job:  [id number]

## 2011-02-11 NOTE — Discharge Summary (Signed)
   [name], [name]                         ACCOUNT NO.:  [id number]   MEDICAL RECORD NO.:  [id number]                   PATIENT TYPE:  INP   LOCATION:  3029                                 FACILITY:  [facility]   PHYSICIAN:  Shan Levans, M.D. [facility]            DATE OF BIRTH:  1961/07/11   DATE OF ADMISSION:  06/07/2003  DATE OF DISCHARGE:  06/08/2003                                 DISCHARGE SUMMARY   DISCHARGE DIAGNOSES:  1. Vent-dependent respiratory failure, secondary to poly-substance overdose.  2. Schizophrenia, followed by Dr. Antonietta Breach.   HISTORY OF PRESENT ILLNESS:  The patient is a 50 year old who presented to  the emergency department with an overdose with a compromised airway and  required intubation by the anesthesia department.  He was intubated and his  mechanical ventilatory support was secondary to substance abuse.   LABORATORY DATA:  A 12-lead electrocardiogram showed a sinus tachycardia  with a ventricular rate of 118.  A chest x-ray showed low lung volume as well as acute cardiac or pulmonary  process.  Followup extubation with left basilar subsegmental atelectasis.  Blood gases on 2 L showed a 40% FIO2, pressure-regulated volume control.  The pH of 7.29, pCO2 of 48, pO2 of 74.  WBC 8.6, hemoglobin 15.5, hematocrit  44.9, platelets 151.  Sodium 143, potassium 3.5, chloride 111, CO2 of 24,  glucose 120, BUN 5, creatinine 0.8, calcium 8.9.  Acetaminophen level was  less than 10, salicylate level was less than 4.  A drug screen was positive  for tricyclics and benzodiazepine.   HOSPITAL COURSE:  #1 - ACUTE RESPIRATORY FAILURE, SECONDARY TO POLY-  SUBSTANCE ABUSE:  The patient required an urgent intubation in the emergency  department by anesthesia.  He was supported on the vent overnight and  extubated the next day.  He was discharged home in an improved condition.  #2 - SCHIZOPHRENIA:  This was evaluated by Dr. Antonietta Breach while in the  hospital.   Please see his comprehensive note.  The patient agreed to call  the emergency service on discharge.   DISCHARGE MEDICATIONS:  Unavailable in the chart.  He is to resume his prior  medications consisting of Paxil, Xanax, trazodone, Seroquel and liquid  Cipro.   DIET:  As before.    DISPOSITION/CONDITION ON DISCHARGE:  Acute respiratory failure, secondary to  poly-substance abuse is resolved.  He is discharged in an improved  condition.        Brett Canales Minor, A.C.N.P. [facility]                 Shan Levans, M.D. [facility]    SM/MEDQ  D:  06/18/2003  T:  06/18/2003  Job:  [id number]   cc:   Antonietta Breach, M.D.  [address]  [place], [place] [address]  Fax: [phone]

## 2011-02-11 NOTE — H&P (Signed)
[name], [name]               ACCOUNT NO.:  [id number]   MEDICAL RECORD NO.:  [id number]          PATIENT TYPE:  IPS   LOCATION:  0405                          FACILITY:  [facility]   PHYSICIAN:  Geoffery Lyons, M.D.      DATE OF BIRTH:  1961/05/20   DATE OF ADMISSION:  10/03/2005  DATE OF DISCHARGE:  10/05/2005                         PSYCHIATRIC ADMISSION ASSESSMENT   A 50 year old single African-American male voluntarily admitted on October 03, 2005.   HISTORY OF PRESENT ILLNESS:  The patient presents with a history of suicidal  ideation and positive auditory hallucinations with voices telling him  different things like you're worthless.  Patient reports a recent use of  cocaine over the weekend, but he states that otherwise he does not abuse any  substances.  He reports he had chest pain which brought him to the emergency  room.  He states when he got there that he had stated I'd rather kill  myself than deal with this pain which was interpreted as a suicidal  ideation and which prompted the admission.  Patient states that, otherwise,  he has been compliant with his medications and feels he is on a satisfactory  medication regime.   PAST PSYCHIATRIC HISTORY:  Patient has had several admissions.  Patient was  last admitted about 2 years ago.  He sees Dr. Lolly Mustache for outpatient  services.  He has a history of several suicide attempts.   SOCIAL HISTORY:  This is a 51 year old single African-American male who  lives with his mother.  He is on disability.  No legal charges.   FAMILY HISTORY:  Denies.   ALCOHOL AND DRUG HISTORY:  Patient smokes, denies any alcohol use.  He  reports recent use of cocaine.   PRIMARY CARE [name]:  Dr. Donia Guiles.   MEDICAL PROBLEMS:  Asthma.   MEDICATIONS:  He been on an albuterol inhaler.   DRUG ALLERGIES:  No known drug allergies.   PHYSICAL EXAMINATION:  Patient was assessed at [facility] for chest pain.  Patient did receive a  nitroglycerin and baby aspirin with a chest x-ray  which showed new bibasilar infiltrates.  Temperature 96.9, heart rate 99,  respirations 16, blood pressure 128/84.  He is 211 pounds.  He is in no  acute distress.  He denies any chest pain.   LABORATORY DATA:  Glucose 103.  Urine drug screen positive for benzos,  positive for cocaine.   MENTAL STATUS EXAM:  He is in the bed, fully alert, cooperative, good eye  contact.  Speech is clear, normal rate and tone.  Patient's affect is  constricted.  Thought process, the patient is endorsing some positive  auditory hallucinations, although he does not appear to be responding at  this time.  Cognitive function intact.  Memory is fair.  Judgment is fair.  Insight is fair.   ADMISSION DIAGNOSES:  AXIS I:  Paranoid schizophrenia.  Cocaine abuse.  AXIS II:  Deferred.  AXIS III:  Asthma.  AXIS IV:  Other psychosocial problems related to substance abuse.  AXIS V:  Current is 35, past year 85.  PLAN:  Contract for safety.  Stabilize mood and thinking.  We will resume  his medications.  Patient is to continue to follow up with Dr. Lolly Mustache.  Substance abuse was discussed with the patient.  Patient is also to follow  up with primary care [name] for his continuing medical problems.  Tentative length of stay 4-5 days.      Landry Corporal, N.P.      Geoffery Lyons, M.D.  Electronically Signed    JO/MEDQ  D:  10/05/2005  T:  10/05/2005  Job:  [id number]

## 2011-02-11 NOTE — Discharge Summary (Signed)
[name], FELLS               ACCOUNT NO.:  [id number]   MEDICAL RECORD NO.:  [id number]          PATIENT TYPE:  IPS   LOCATION:  0402                          FACILITY:  [facility]   PHYSICIAN:  Jeanice Lim, M.D. DATE OF BIRTH:  January 20, 1961   DATE OF ADMISSION:  09/23/2004  DATE OF DISCHARGE:  09/25/2004                                 DISCHARGE SUMMARY   IDENTIFYING DATA:  This is a 50 year old single African-American male  voluntarily admitted.  Presenting with an increase in auditory  hallucinations, unmanageable since the weekend.  Reported having gotten  high.  Had been sober since 2003.  Feeling overwhelmed.   MEDICATIONS:  Seroquel, Abilify, Paxil, trazodone and Xanax.   ALLERGIES:  RISPERDAL and COGENTIN, having dystonic reactions and dry mouth  and constipation with COGENTIN, dystonic reactions with RISPERDAL.   PHYSICAL EXAMINATION:  Physical and neurologic exam within normal limits.   LABORATORY DATA:  Routine admission labs within normal limits.   MENTAL STATUS EXAM:  Alert, oriented, middle-aged male.  Cooperative.  Fair  eye contact.  Speech clear.  Mood feeling better.  Affect pleasant.  Thought  processes goal directed, positive hearing his name at times as far as  auditory hallucinations.  Cognitively intact.  Judgment and insight were  fair.   ADMISSION DIAGNOSES:   AXIS I:  1.  Paranoid schizophrenia by history.  2.  Alcohol dependence and cocaine abuse.  3.  Polysubstance abuse.   AXIS II:  Deferred.   AXIS III:  None.   AXIS IV:  Moderate (problems with primary support group and limited support  system, other psychosocial stressors).   AXIS V:  30/55.   HOSPITAL COURSE:  The patient was admitted and ordered routine p.r.n.  medications and underwent further monitoring.  Was encouraged to participate  in individual, group and milieu therapy.  The patient reported command  hallucinations to do self-harm, visual spirits coming after him,  describing  paranoia.  Able to contract for safety inside the hospital.  The patient  reports that he chronically has voices and that he is able to deal with them  but, after drinking and possibly becoming intoxicated, he became overwhelmed  and increasingly paranoid.  The patient rapidly improved after stabilization  and monitoring.   CONDITION ON DISCHARGE:  The patient was discharged in improved condition,  having insight regarding the dangerousness of substances in light of his  thought disorder.  He had no command hallucinations.  No suicidal or  homicidal ideation.  Judgment and insight had improved.  He appeared to be  euthymic, calm and appropriate for discharge with no risk issues.  A follow-  up plan was in place and patient reported understanding of the importance of  compliance with medications as well as abstinence.  The patient was given  medication education.   DISCHARGE MEDICATIONS:  1.  Seroquel 200 mg t.i.d.  2.  Abilify 20 mg q.h.s.  3.  Paxil 30 mg b.i.d.  4.  Loxitane 10 mg b.i.d. and 3 at night.  5.  Trazodone 150 mg, 2 at night.   FOLLOW  UP:  The patient was to call Dr. Lolly Mustache for follow-up appointment  within 7-10 days at [facility].   DISCHARGE DIAGNOSES:   AXIS I:  1.  Paranoid schizophrenia by history.  2.  Alcohol dependence and cocaine abuse.  3.  Polysubstance abuse.   AXIS II:  Deferred.   AXIS III:  None.   AXIS IV:  Moderate (problems with primary support group and limited support  system, other psychosocial stressors).   AXIS V:  Global Assessment of Functioning on discharge 55.      JEM/MEDQ  D:  10/27/2004  T:  10/27/2004  Job:  [id number]

## 2011-02-11 NOTE — Discharge Summary (Signed)
[name], [name]               ACCOUNT NO.:  [id number]   MEDICAL RECORD NO.:  [id number]          PATIENT TYPE:  IPS   LOCATION:  0403                          [facility]ACILITY:  [facility]   PHYSICIAN:  Jasmine Pang, M.D. DATE OF BIRTH:  1961-02-27   DATE OF ADMISSION:  02/18/2006  DATE OF DISCHARGE:  02/22/2006                                 DISCHARGE SUMMARY   CONTINUATION:   LABORATORY:  Hepatic function profile was within normal limits.  Hepatitis B  surface antigen was negative.  Anti-HCV was negative.  Anti-HAV IgM was  negative, Hepatitis B core IgM was negative.  CBC was within normal limits.  WBC was 9.6 (4-10.5).   HOSPITAL COURSE:  Upon admission, the patient was placed on Xanax 1 mg p.o.  p.r.n. q.6h. anxiety or agitation, Paxil 30 mg p.o. b.i.d., trazodone 300 mg  p.o. q.h.s., Clozaril 125 mg p.o. b.i.d.  A hepatitis panel was ordered and  an ultrasound of the liver.  The patient, however, was noncompliant with the  n.p.o. directions for the ultrasound of the liver.  It was finally able to  be done on his last day of admission.  On Feb 20, 2006, a nicotine patch 21  mg daily was ordered.  On Feb 21, 2006, Clozaril was increased in the  evening dose to 150 mg for a total dose of 275 mg that day.  The following  day, Clozaril was increased to 100 mg in the morning and 200 mg at 1800.  The patient tolerated these medications well with no significant side  effects.   On Feb 18, 2006, I met with the patient, introduced myself.  He admitted to  an overdose on Paxil and trazodone.  He stated he was hearing voices telling  him to harm himself.  He was also having some visual hallucinations.  He has  a diagnosis of schizophrenia.  The patient's UDS is positive for cocaine and  alcohol.  He states he lives with his mother in [place].  He was friendly  and cooperative with me.  On Feb 19, 2006, the patient continued to have  flat, blunted affect.  She is isolative and withdrawn.   She was not as  spontaneous with one-word answers to questions.  He was paranoid and  delusional, reports again that he is in the hospital due to an overdose  attempt.  He again talked about his auditory and visual hallucinations  telling him to hurt himself.  He states his mother with whom he lives is a  support for him.  On Feb 20, 2006, he stated he did not sleep well.  He  stated I'm nervous.  He still had auditory hallucinations with voices  telling him to hurt himself.  He contracts for safety on the unit.  He lives  with his mother, feels she is supportive.  On Feb 21, 2006, the patient  stated he felt better.  He wanted to go home.  There were no more command  hallucinations.  He does hear some voices but they were benign.  He felt the  hallucinations were  manageable.   On Feb 22, 2006, the patient was anxious about discharge but wanted to go  home.  Mother was contacted and felt he was ready to come home.  She stated  she missed him and would like him to be home.  Mental status exam had  improved from admission status.  The patient sat and talked with me in a  friendly cooperative manner.  He had good eye contact with less psychomotor  retardation than upon admission.  Still some psychomotor retardation.  Speech normal rate and flow.  Mood improved from admit but still mild  anxiety regarding discharge.  This is manageable and he still wants to  leave.  Affect constricted but able to smile at times.  No suicidal or  homicidal ideation.  No self-injurious behavior.  Positive auditory  hallucinations but no command hallucinations.  States these hallucinations  are manageable and he is used to having these.  No visual hallucinations.  No evidence of paranoia or delusions.  Cognitive exam baseline.  Alert and  oriented x4.  Attention and concentration are better.  Short-term and long-  term memory are grossly within normal limits.  Insight fair.  Judgment good.   DISCHARGE  DIAGNOSES:  AXIS I:  Polysubstance dependence.  Psychotic disorder  not otherwise specified.  AXIS II:  No diagnosis.  AXIS III:  Rule out liver cirrhosis.  AXIS IV:  Severe (chronic mental illness).  AXIS V: GAF upon discharge 40; GAF upon admission 10; GAF highest past year  50.   ACTIVITY/DIET:  There was no specific activity level or dietary  restrictions.   DISCHARGE MEDICATIONS:  1.  Paroxetine 30 mg q.d.  2.  Trazodone 150 mg, 2 pills daily at bedtime.  3.  Clozaril 100 mg, 1 pill in the morning and 200 mg at bedtime.  4.  Alprazolam 1 mg, 1 pill three times daily.   POST-HOSPITAL CARE PLANS:  The patient will see Dr. Lolly Mustache at [facility] on March 01, 2006 at 1 p.m.      Jasmine Pang, M.D.  Electronically Signed     BHS/MEDQ  D:  02/23/2006  T:  02/24/2006  Job:  [id number]

## 2011-02-11 NOTE — Discharge Summary (Signed)
[facility]  Patient:    [name], [name] Visit Number: [id number] MRN: [id number]          Service Type: PSY Attending Physician:  Denny Peon Dictated by:   Reymundo Poll Dub Mikes, M.D. Admit Date:  12/20/2001 Discharge Date: 12/25/2001                             Discharge Summary  CHIEF COMPLAINT AND HISTORY OF PRESENT ILLNESS:  This was one of several admissions to [facility] for this 50 year old male voluntarily admitted for depression and suicidal ideation.  History of intentional overdose on Zyprexa, Paxil.  Claimed command hallucinations telling him to kill himself.  History of auditory hallucinations for 20 years. Claimed that he had to drink some beer to have the courage to do it.  He was found by his mother who called 911.  No significant stressors.  Visual hallucinations about a man in a black coat; ruminations about wanting to die although could contract for safety on the unit.  PAST PSYCHIATRIC HISTORY:  Has been in several places for depression as well as for detoxification.  Sees Dr. Octavio Graves at the [facility].  SUBSTANCE ABUSE HISTORY:  Smokes, six pack a day of beer.  PAST MEDICAL HISTORY:  Noncontributory.  MEDICATIONS ON ADMISSION: 1. Effexor XR 150 mg daily. 2. Paxil 60 mg daily. 3. Zyprexa 10 mg three times a day. 4. Trazodone. 5. Librium as needed for anxiety.  MENTAL STATUS EXAMINATION ON ADMISSION:  Alert, well-nourished male, pleasant, casually dressed.  Speech: Normal and relevant.  Mood: Depressed.  Affect: Flat.  Thought processes: Coherent; denied any active suicidal ideation although admitted to voices.   Cognitive: Well preserved.  ADMITTING DIAGNOSES: Axis I:    1. Possible schizoaffective disorder, depressed.            2. Polysubstance dependence. Axis II:   No diagnosis. Axis III:  No diagnosis. Axis IV:   Moderate. Axis V:    Global assessment of functioning upon admission 25,  highest global            assessment of functioning in the last year 60.  LABORATORY DATA:  CBC was within normal limits.  Blood chemistries were within normal limits.  Thyroid profile was within normal limits.  Drug screen was negative for substances other than benzodiazepines and barbiturates.  HOSPITAL COURSE:  He was admitted and started in intensive individual and group psychotherapy.  He was detoxified using phenobarbital.  Was placed on Zyprexa 10 mg at night and 5 mg as needed for anxiety or agitation and Paxil 60 mg daily.  Zyprexa was changed to 10 mg in the morning, 3 p.m., and bedtime.  Paxil was decreased with a plan to discontinue and maintain on Effexor but the patient stated that Paxil was the medication that worked for him the most and he wanted to maintain it.  Therefore Paxil was increased back to 60 mg per day and Effexor was increased to 150 mg per day.  He was maintained on the Zyprexa and was given some Loxitane to augment.  Continued to hear voices but on April 1, he felt he was better.  Apparently Zyprexa and Loxitane had helped the voices; said that he was feeling better than he had felt in a long time.  Denied any suicidal or homicidal ideas, was willing and motivated to pursue outpatient treatment at [facility] and to maintain  the medications he was taking.  DISCHARGE DIAGNOSES: Axis I:    1. Schizoaffective disorder.            2. Alcohol dependence. Axis II:   No diagnosis. Axis III:  No diagnosis. Axis IV:   Moderate. Axis V:    Global assessment of functioning upon discharge 55-60.  DISCHARGE MEDICATIONS: 1. Zyprexa 10 mg three times a day. 2. Loxitane 10 mg three times a day. 3. Paxil 30 mg two daily. 4. Trazodone 100 mg at bedtime for sleep. 5. Librium as needed for anxiety.  FOLLOWUP:  [facility]. Dictated by:   Reymundo Poll Dub Mikes, M.D. Attending Physician:  Denny Peon DD:  02/06/02 TD:  02/08/02 Job: [id number]

## 2011-02-11 NOTE — H&P (Signed)
   NAMESIGIFREDO, PIGNATO                         ACCOUNT NO.:  [id number]   MEDICAL RECORD NO.:  [id number]                   PATIENT TYPE:  INP   LOCATION:  3029                                 FACILITY:  [facility]   PHYSICIAN:  Shan Levans, M.D. [facility]            DATE OF BIRTH:  January 21, 1961   DATE OF ADMISSION:  06/07/2003  DATE OF DISCHARGE:                                HISTORY & PHYSICAL   CHIEF COMPLAINT:  Multi-drug overdose.   HISTORY OF PRESENT ILLNESS:  This is a 50 year old African American male,  paranoid schizophrenic, took polysubstance overdose this evening and needed  intubation in the emergency room, now admitted to the ICU for ventilator  care.  This was a suicide attempt.  Drugs include Seroquel, Loxitane, Xanax,  and Paxil.  Also had ethanol on board.  This is one of multiple suicidal  attempts in the past.  The last one recorded being that in February of this  year.   PAST MEDICAL HISTORY:  1. Paranoid schizophrenia.  2. History of ethanolism.  3. Pancreatitis.  4. Gastroesophageal reflux disease.  5. Anxiety.   SOCIAL HISTORY:  Lives with his mother who is a smoker.  Previous multiple  suicide attempts in the past, last one in February 2004.   FAMILY HISTORY:  Positive for coronary artery disease.   MEDICATIONS PRIOR TO ADMISSION:  Seroquel, Loxitane, Xanax, and trazodone.   PHYSICAL EXAMINATION:  VITAL SIGNS:  Blood pressure 160/110, pulse 100,  orally intubated.  CHEST:  Equal breath sounds.  No wheeze or rhonchi.  CARDIAC:  Tachycardic without S3.  No S1 and S2.  ABDOMEN:  Soft, nontender.  EXTREMITIES:  Show no edema or clubbing or venous disease.  SKIN:  Clear.  NEUROLOGIC:  The patient is unresponsive.   LABORATORY DATA:  Hemoglobin 15, white count 8.6.  pH 7.3, PCO2 45, PO2 109  on 2 L prior to intubation.  Chest x-ray and other labs are pending at the  time of this dictation.   IMPRESSION:  Polysubstance overdose with history of paranoid  schizophrenia,  now with respiratory failure due to decreased level of consciousness.   RECOMMENDATIONS:  1. Activated charcoal administration.  2. IV sodium bicarbonate by IV fluid.  3.     Full vent support.  4. ICU admission.  5. Sedation protocol.                                                Shan Levans, M.D. [facility]    PW/MEDQ  D:  06/07/2003  T:  06/08/2003  Job:  [id number]

## 2011-02-11 NOTE — H&P (Signed)
NAMEGRACESON, NICHELSON               ACCOUNT NO.:  [id number]   MEDICAL RECORD NO.:  [id number]          PATIENT TYPE:  IPS   LOCATION:  0403                          [facility]ILITY[facility]  [facility]   PHYSICIAN:  Geoffery Lyons, M.D.      DATE OF BIRTH:  1960/10/17   DATE OF ADMISSION:  12/27/2005  DATE OF DISCHARGE:                         PSYCHIATRIC ADMISSION ASSESSMENT   IDENTIFYING DATA:  The patient is a 50 year old single African-American male  voluntarily admitted on December 27, 2005.   HISTORY OF PRESENT ILLNESS:  The patient presents with auditory  hallucinations, voices commanding him to get cocaine and inject it into  himself.  He states that voices have been very intense.  He states that  normally he does not use cocaine.  He has been compliant with his  medications.  Having difficulty with sleep.  His appetite has been  satisfactory.  He denies any significant stressors.   PAST PSYCHIATRIC HISTORY:  Several admissions to [facility].  He has been to AA and NA in the past.  He sees Dr. Lolly Mustache for outpatient  mental health services.   SOCIAL HISTORY:  This is a 50 year old single African-American male.  He  lives with his mother.  He has finished 14 years of schooling.  Has a  history of a DUI but no current legal problems.   FAMILY HISTORY:  None.   ALCOHOL/DRUG HISTORY:  The patient smokes.  Denies any alcohol use.  Reports  recent IV cocaine use.  Denies any other substances.   PRIMARY CARE PHYSICIAN:  Dr. Donia Guiles in [place].   MEDICAL PROBLEMS:  The patient is healthy.   MEDICATIONS:  The patient has been on Loxitane 10 mg b.i.d. with Loxitane 30  mg at bedtime, Xanax 1 mg t.i.d. p.r.n., Abilify 20 mg at bedtime, Seroquel  200 mg twice a day with 400 mg at bedtime and trazodone 300 mg q.h.s.   ALLERGIES:  No known allergies.   PHYSICAL EXAMINATION:  The patient was initially assessed at [facility]  Emergency Department.  He presents as a middle-aged male in  no acute  distress.  Temperature is 98.2, heart rate 89, respirations 20, blood  pressure 114/77, 98% saturated, height 6 feet 4 inches tall, weight 210  pounds.   REVIEW OF SYSTEMS:  The patient is positive for depression, positive for  substance abuse, positive for insomnia.  Remainder of systems are  noncontributory.   LABORATORY DATA:  Glucose is 100.  TSH is 0.843.  Hemoglobin A1C is 5.7.  Urine drug screen is positive for benzodiazepines, positive for cocaine.   MENTAL STATUS EXAM:  Fully alert, cooperative male with good eye contact.  Resting in bed.  Speech is clear, normal rate and tone.  The patient feels  depressed.  Affect is flat.  Thought processes are with patient endorsing  positive auditory hallucinations.  Promising safety.  Denies any visual  hallucinations or current suicidal thoughts.  Cognitive function is intact.  Memory is good.  Judgment is poor.  Insight is fair.   DIAGNOSES:  AXIS I:  Schizophrenia, paranoid.  Cocaine abuse.  AXIS II:  Deferred.  AXIS III:  None.  AXIS IV:  Problems with social environment, other psychosocial problems  related to current substance abuse.  AXIS V:  Current 30.   PLAN:  To contract for safety.  To stabilize mood and thinking.  Work on  relapse prevention.  We will discontinue Seroquel and initiate Clozaril  b.i.d.  Will check CBCs every three days.  The patient is to follow up with  Dr. Lolly Mustache.  Will consider HIV testing.  The patient at this time does not  feel this is necessary.   TENTATIVE LENGTH OF STAY:  Five to seven days.      Landry Corporal, N.P.      Geoffery Lyons, M.D.  Electronically Signed    JO/MEDQ  D:  12/29/2005  T:  12/31/2005  Job:  [id number]

## 2011-02-11 NOTE — Discharge Summary (Signed)
[name], [name]               ACCOUNT NO.:  [id number]   MEDICAL RECORD NO.:  [id number]          PATIENT TYPE:  IPS   LOCATION:  0403                          [facility]ACILITY:  [facility]   PHYSICIAN:  Geoffery Lyons, M.D.      DATE OF BIRTH:  04-Aug-1961   DATE OF ADMISSION:  12/27/2005  DATE OF DISCHARGE:  01/11/2006                                 DISCHARGE SUMMARY   CHIEF COMPLAINT AND PRESENT ILLNESS:  This was one of multiple admissions to  [facility] for this 50 year old single African-American  male voluntarily admitted.  Auditory hallucinations with voices calling him  to get cocaine and inject it into himself.  The voices have been very  intense.  Normally, he does not use cocaine.  Has been compliant with his  medication, having difficulty with sleep.  Appetite has been satisfactory.  No significant stressors.   PAST PSYCHIATRIC HISTORY:  Several admissions to [facility].  Had  been to AA and NA in the past as well as [facility].  Following up with  Dr. Lolly Mustache at [facility].   ALCOHOL/DRUG HISTORY:  Denies active alcohol use.  Reported IV cocaine use.  Claims this is a way to kill himself.  Denies any other substances.   MEDICAL HISTORY:  Noncontributory.   MEDICATIONS:  Loxitane 10 mg twice a day, 30 mg at night, Xanax 1 mg three  times a day, Abilify 20 mg at night, Seroquel 200 mg twice a day, 400 mg at  bedtime, trazodone 300 mg at bedtime.   PHYSICAL EXAMINATION:  Performed and failed to show any acute findings.   LABORATORY DATA:  Glucose 100.  TSH 0.843.  Hemoglobin A1C 5.7.  Urine drug  screen positive for benzodiazepines and cocaine.  EKG within normal limits.   MENTAL STATUS EXAM:  Alert, cooperative male.  Good eye contact.  Speech was  clear, normal rate, tempo and production.  Somewhat guarded, somewhat  reserved.  Mood depressed.  Affect flat.  Thought processes endorsing  auditory hallucinations with voices telling  him to hurt himself.  Could  promise safety.  Denied any visual hallucinations.  Cognition was well-  preserved.   ADMISSION DIAGNOSES:  AXIS I:  Schizophrenia, paranoid-type.  Cocaine abuse.  AXIS II:  No diagnosis.  AXIS III:  No diagnosis.  AXIS IV:  Moderate.  AXIS V:  GAF upon admission 25-30; highest GAF in the last year 60.   HOSPITAL COURSE:  He was admitted.  He was started in individual and group  psychotherapy.  Endorsed increased hallucinations.  Completely overwhelmed  with them.  On Seroquel, Abilify and Loxitane.  Unable to control the  voices.  Primary psychiatrist recommended switching to Clozaril.  He was  willing to give it a try as he felt he could not function like he was.  We  started decreasing the Seroquel and establishing and increasing the  Clozaril.  We were eventually able to discontinue the Loxitane and the  Abilify and we maintained the Seroquel and started increasing the Clozaril  by 25 mg per day  as tolerated.  He tolerated the Clozaril well.  Through the  hospitalization, he was still endorsing that the voices were present but  slowly he started noticing a decrease in the frequency and the intensity.  Insomnia was a big issue.  We worked with the trazodone.  By April 13th, he  was starting to sleep better.  Continued to tolerate the Clozaril well.  The  voices were present but they continued to get better.  On April 16th, still  endorses the voices but were better.  They were not telling him to hurt  himself.  He was sleeping better at night.  On April 17th, could endorse  that the voices had decreased markedly.  Endorsed it was the best he had  felt in terms of hallucinations and, on April 18th, he was in full contact  with reality.  Endorsed no suicidal or homicidal ideation.  No active  hallucinations.  No delusions.  Endorsed he was much better.  Sleeping  through the night.  First time that the voices were under control.  CBC with  white blood  cells 8.2.  Tolerating the Clozaril well.   DISCHARGE DIAGNOSES:  AXIS I:  Rule out schizoaffective disorder with  schizophrenia, paranoid-type.  AXIS II:  No diagnosis.  AXIS III:  No diagnosis.  AXIS IV:  Moderate.  AXIS V:  GAF upon discharge 50.   DISCHARGE MEDICATIONS:  1.  Paxil 20 mg twice a day.  2.  Trazodone 150 mg, 2 at night.  3.  Chantix 1 mg twice a day.  4.  Clozaril 100 mg twice a day and 25 mg twice a day.  5.  Albuterol 2 puffs every four hours as needed.  6.  Xanax 1 mg three times a day as needed for anxiety.   FOLLOW UP:  [facility] Lolly [facility].      Geoffery Lyons, M.D.  Electronically Signed     IL/MEDQ  D:  01/24/2006  T:  01/25/2006  Job:  [id number]

## 2011-02-17 ENCOUNTER — Encounter (HOSPITAL_COMMUNITY): Payer: Medicare Other | Admitting: Licensed Clinical Social Worker

## 2011-02-28 ENCOUNTER — Ambulatory Visit: Payer: Medicare Other | Admitting: Internal Medicine

## 2011-02-28 ENCOUNTER — Encounter: Payer: Self-pay | Admitting: Internal Medicine

## 2011-02-28 NOTE — Progress Notes (Unsigned)
  Subjective:    Patient ID: [name], male    DOB: 1961-05-23, 50 y.o.   MRN: [id number]  HPI    Review of Systems     Objective:   Physical Exam      Lab Results  Component Value Date   WBC 13.4* 11/29/2010   HGB 16.1 11/29/2010   HCT 46.4 11/29/2010   PLT 120.0* 11/29/2010   CHOL 178 11/29/2010   TRIG 145.0 11/29/2010   HDL 34.00* 11/29/2010   LDLDIRECT 192.2 06/23/2008   ALT 23 11/29/2010   AST 22 11/29/2010   NA 136 11/29/2010   K 4.2 11/29/2010   CL 105 11/29/2010   CREATININE 0.8 11/29/2010   BUN 12 11/29/2010   CO2 22 11/29/2010   TSH 0.82 11/29/2010   PSA 0.47 12/21/2009   INR 1.0 12/19/2008   HGBA1C 6.4 11/29/2010   MICROALBUR 0.2 10/08/2008    Assessment & Plan:

## 2011-03-03 ENCOUNTER — Other Ambulatory Visit: Payer: Self-pay | Admitting: Cardiology

## 2011-03-03 DIAGNOSIS — I739 Peripheral vascular disease, unspecified: Secondary | ICD-10-CM

## 2011-03-04 ENCOUNTER — Encounter: Payer: Self-pay | Admitting: Cardiology

## 2011-03-07 ENCOUNTER — Encounter (HOSPITAL_COMMUNITY): Payer: Medicare Other | Admitting: Licensed Clinical Social Worker

## 2011-03-07 DIAGNOSIS — F200 Paranoid schizophrenia: Secondary | ICD-10-CM

## 2011-03-14 ENCOUNTER — Encounter: Payer: Self-pay | Admitting: Internal Medicine

## 2011-03-15 ENCOUNTER — Ambulatory Visit (INDEPENDENT_AMBULATORY_CARE_PROVIDER_SITE_OTHER): Payer: Medicare Other | Admitting: Internal Medicine

## 2011-03-15 ENCOUNTER — Encounter: Payer: Self-pay | Admitting: Internal Medicine

## 2011-03-15 ENCOUNTER — Other Ambulatory Visit: Payer: Self-pay | Admitting: Internal Medicine

## 2011-03-15 ENCOUNTER — Other Ambulatory Visit (INDEPENDENT_AMBULATORY_CARE_PROVIDER_SITE_OTHER): Payer: Medicare Other

## 2011-03-15 DIAGNOSIS — M1710 Unilateral primary osteoarthritis, unspecified knee: Secondary | ICD-10-CM

## 2011-03-15 DIAGNOSIS — B181 Chronic viral hepatitis B without delta-agent: Secondary | ICD-10-CM

## 2011-03-15 DIAGNOSIS — IMO0002 Reserved for concepts with insufficient information to code with codable children: Secondary | ICD-10-CM

## 2011-03-15 DIAGNOSIS — N529 Male erectile dysfunction, unspecified: Secondary | ICD-10-CM

## 2011-03-15 DIAGNOSIS — D696 Thrombocytopenia, unspecified: Secondary | ICD-10-CM

## 2011-03-15 DIAGNOSIS — I10 Essential (primary) hypertension: Secondary | ICD-10-CM

## 2011-03-15 DIAGNOSIS — M179 Osteoarthritis of knee, unspecified: Secondary | ICD-10-CM

## 2011-03-15 DIAGNOSIS — N342 Other urethritis: Secondary | ICD-10-CM

## 2011-03-15 DIAGNOSIS — R509 Fever, unspecified: Secondary | ICD-10-CM

## 2011-03-15 DIAGNOSIS — J449 Chronic obstructive pulmonary disease, unspecified: Secondary | ICD-10-CM

## 2011-03-15 DIAGNOSIS — E782 Mixed hyperlipidemia: Secondary | ICD-10-CM

## 2011-03-15 DIAGNOSIS — Z125 Encounter for screening for malignant neoplasm of prostate: Secondary | ICD-10-CM

## 2011-03-15 DIAGNOSIS — E118 Type 2 diabetes mellitus with unspecified complications: Secondary | ICD-10-CM

## 2011-03-15 DIAGNOSIS — N138 Other obstructive and reflux uropathy: Secondary | ICD-10-CM

## 2011-03-15 DIAGNOSIS — N401 Enlarged prostate with lower urinary tract symptoms: Secondary | ICD-10-CM

## 2011-03-15 LAB — COMPREHENSIVE METABOLIC PANEL
ALT: 34 U/L (ref 0–53)
AST: 23 U/L (ref 0–37)
Albumin: 4.3 g/dL (ref 3.5–5.2)
Alkaline Phosphatase: 74 U/L (ref 39–117)
Glucose, Bld: 99 mg/dL (ref 70–99)
Potassium: 4.3 mEq/L (ref 3.5–5.1)
Sodium: 140 mEq/L (ref 135–145)
Total Protein: 6.7 g/dL (ref 6.0–8.3)

## 2011-03-15 LAB — CBC WITH DIFFERENTIAL/PLATELET
Basophils Relative: 0.4 % (ref 0.0–3.0)
Eosinophils Absolute: 0.1 10*3/uL (ref 0.0–0.7)
Lymphocytes Relative: 30.2 % (ref 12.0–46.0)
MCHC: 34.7 g/dL (ref 30.0–36.0)
Neutrophils Relative %: 60.9 % (ref 43.0–77.0)
Platelets: 116 10*3/uL — ABNORMAL LOW (ref 150.0–400.0)
RBC: 5.51 Mil/uL (ref 4.22–5.81)
WBC: 9.7 10*3/uL (ref 4.5–10.5)

## 2011-03-15 LAB — URINALYSIS, ROUTINE W REFLEX MICROSCOPIC
Hgb urine dipstick: NEGATIVE
Leukocytes, UA: NEGATIVE
Nitrite: NEGATIVE
Specific Gravity, Urine: 1.02 (ref 1.000–1.030)
Urobilinogen, UA: 0.2 (ref 0.0–1.0)

## 2011-03-15 LAB — LIPID PANEL
HDL: 35.4 mg/dL — ABNORMAL LOW (ref >39.00)
Triglycerides: 247 mg/dL — ABNORMAL HIGH (ref 0.0–149.0)
VLDL: 49.4 mg/dL — ABNORMAL HIGH (ref 0.0–40.0)

## 2011-03-15 LAB — TSH: TSH: 1.16 u[IU]/mL (ref 0.35–5.50)

## 2011-03-15 LAB — HEMOGLOBIN A1C: Hgb A1c MFr Bld: 6.6 % — ABNORMAL HIGH (ref 4.6–6.5)

## 2011-03-15 MED ORDER — CELECOXIB 200 MG PO CAPS: 200.0000 mg | ORAL_CAPSULE | Freq: Every day | ORAL | Status: DC

## 2011-03-15 MED ORDER — SILDENAFIL CITRATE 100 MG PO TABS: 100.0000 mg | ORAL_TABLET | ORAL | Status: DC | PRN

## 2011-03-15 NOTE — Assessment & Plan Note (Signed)
He has no s/s of infection or complication, I will check his CMP today

## 2011-03-15 NOTE — Assessment & Plan Note (Signed)
Will check labs and look for STI's

## 2011-03-15 NOTE — Assessment & Plan Note (Signed)
Will check FLP today.   

## 2011-03-15 NOTE — Assessment & Plan Note (Signed)
His blood sugars are well controlled 

## 2011-03-15 NOTE — Assessment & Plan Note (Signed)
Check UA and screen for STI

## 2011-03-15 NOTE — Assessment & Plan Note (Signed)
I will recheck his CBC today 

## 2011-03-15 NOTE — Assessment & Plan Note (Signed)
Continue current meds 

## 2011-03-15 NOTE — Progress Notes (Signed)
Subjective:    Patient ID: [name], male    DOB: 1960-12-15, 50 y.o.   MRN: [id number]  Erectile Dysfunction This is a chronic problem. The current episode started more than 1 year ago. The problem has been gradually worsening since onset. The nature of his difficulty is maintaining erection and penetration. He reports no anxiety, decreased libido or performance anxiety. He reports his erection duration to be less than 1 minute. Irritative symptoms do not include frequency, nocturia or urgency. Obstructive symptoms do not include dribbling, incomplete emptying, an intermittent stream, a slower stream, straining or a weak stream. Associated symptoms include dysuria. Pertinent negatives include no chills, genital pain, hematuria, hesitancy or inability to urinate. The symptoms are aggravated by medications. Past treatments include vardenafil. The treatment provided no relief. He has been using treatment for 1 to 6 months. He has had no adverse reactions caused by medications. Risk factors include diabetes mellitus and hypertension.  Diabetes He presents for his follow-up diabetic visit. He has type 2 diabetes mellitus. His disease course has been improving. There are no hypoglycemic associated symptoms. Pertinent negatives for hypoglycemia include no confusion, dizziness, headaches, nervousness/anxiousness, pallor, seizures, speech difficulty or tremors. There are no diabetic associated symptoms. Pertinent negatives for diabetes include no chest pain, no fatigue and no weakness. There are no hypoglycemic complications. Symptoms are stable. There are no diabetic complications. Risk factors for coronary artery disease include no known risk factors. Current diabetic treatment includes diet. He is compliant with treatment most of the time. His weight is stable. He is following a generally healthy diet. Meal planning includes avoidance of concentrated sweets. He has not had a previous visit with a dietician.  He participates in exercise intermittently. His breakfast blood glucose range is generally 70-90 mg/dl. His lunch blood glucose range is generally 70-90 mg/dl. His dinner blood glucose range is generally 90-110 mg/dl. His highest blood glucose is 90-110 mg/dl. His overall blood glucose range is 70-90 mg/dl. An ACE inhibitor/angiotensin II receptor blocker is contraindicated. He sees a podiatrist.Eye exam is current.      Review of Systems  Constitutional: Positive for fever. Negative for chills, diaphoresis, activity change, appetite change, fatigue and unexpected weight change.  HENT: Negative for ear pain, nosebleeds, congestion, sore throat, facial swelling, rhinorrhea, trouble swallowing, neck pain, neck stiffness, voice change, postnasal drip and sinus pressure.   Respiratory: Positive for cough (chronic, unchanged, nonproductive cough). Negative for apnea, choking, chest tightness, shortness of breath, wheezing and stridor.   Cardiovascular: Negative for chest pain, palpitations and leg swelling.  Gastrointestinal: Negative for nausea, vomiting, abdominal pain, diarrhea, constipation, blood in stool, abdominal distention, anal bleeding and rectal pain.  Genitourinary: Positive for dysuria. Negative for hesitancy, urgency, frequency, hematuria, flank pain, decreased urine volume, enuresis, difficulty urinating, decreased libido, incomplete emptying and nocturia.  Musculoskeletal: Positive for arthralgias (bilateral knee pain for many months). Negative for myalgias, back pain, joint swelling and gait problem.  Skin: Negative for color change, pallor and rash.  Neurological: Negative for dizziness, tremors, seizures, syncope, facial asymmetry, speech difficulty, weakness, light-headedness, numbness and headaches.  Hematological: Negative for adenopathy. Does not bruise/bleed easily.  Psychiatric/Behavioral: Negative for suicidal ideas, hallucinations, behavioral problems, confusion, sleep  disturbance, self-injury, dysphoric mood, decreased concentration and agitation. The patient is not nervous/anxious and is not hyperactive.        Objective:   Physical Exam  Vitals reviewed. Constitutional: He is oriented to person, place, and time. He appears well-developed and well-nourished. No distress.  HENT:  Head: Normocephalic and atraumatic.  Right Ear: External ear normal.  Left Ear: External ear normal.  Nose: Nose normal.  Mouth/Throat: Oropharynx is clear and moist. No oropharyngeal exudate.  Eyes: Conjunctivae and EOM are normal. Pupils are equal, round, and reactive to light. Right eye exhibits no discharge. Left eye exhibits no discharge. No scleral icterus.  Neck: Normal range of motion. Neck supple. No JVD present. No tracheal deviation present. No thyromegaly present.  Cardiovascular: Normal rate, regular rhythm, normal heart sounds and intact distal pulses.  Exam reveals no gallop and no friction rub.   No murmur heard. Pulmonary/Chest: Effort normal and breath sounds normal. No stridor. No respiratory distress. He has no wheezes. He has no rales. He exhibits no tenderness.  Abdominal: Soft. Bowel sounds are normal. He exhibits no distension and no mass. There is no tenderness. There is no rebound and no guarding. Hernia confirmed negative in the right inguinal area and confirmed negative in the left inguinal area.  Genitourinary: Rectum normal, prostate normal, testes normal and penis normal. Rectal exam shows no external hemorrhoid, no internal hemorrhoid, no fissure, no mass, no tenderness and anal tone normal. Guaiac negative stool. Prostate is not enlarged and not tender. Right testis shows no mass, no swelling and no tenderness. Left testis shows no mass, no swelling and no tenderness. Uncircumcised. No phimosis, paraphimosis, hypospadias, penile erythema or penile tenderness. No discharge found.  Musculoskeletal: Normal range of motion. He exhibits no edema and no  tenderness.  Lymphadenopathy:    He has no cervical adenopathy.       Right: No inguinal adenopathy present.       Left: No inguinal adenopathy present.  Neurological: He is alert and oriented to person, place, and time. He has normal reflexes. He displays normal reflexes. No cranial nerve deficit. He exhibits normal muscle tone. Coordination normal.  Skin: Skin is warm and dry. No rash noted. He is not diaphoretic. No erythema. No pallor.  Psychiatric: He has a normal mood and affect. His behavior is normal. Judgment and thought content normal.          Assessment & Plan:

## 2011-03-15 NOTE — Patient Instructions (Signed)
Chronic Obstructive Pulmonary Disease (COPD) Chronic obstructive pulmonary disease (COPD) is a condition in which airflow from the lungs is restricted. The lungs can never return to normal, but there are measures you can take which will improve them and make you feel better. CAUSES  Smoking.   Breathing in irritants (pollution, cigarette smoke, strong odors, aerosol sprays, paint fumes).   History of lung infections.  TREATMENT  Treatment focuses on making you comfortable (supportive care).  HOME CARE INSTRUCTIONS  If you smoke, stop smoking. The carbon monoxide buildup in the blood robs you of your already short oxygen supply.   Take medicines (antibiotics) that kill germs as directed.   Avoid antihistamines and cough syrups. They dry up your system and slow down the elimination of secretions. This decreases respiratory capacity and may lead to infections.   Drink enough water and fluids to keep your urine clear or pale yellow. This loosens secretions.   Use humidifiers at home and at your bedside if they do not make breathing difficult.   Receive all protective vaccines your caregiver suggests, especially pneumococcal and influenza.   Use home oxygen as suggested.  SEEK MEDICAL CARE IF:  You develop pus-like mucus (sputum).   You have an oral temperature above 100.5.   Breathing is more labored or exercise becomes difficult to do.   You are running out of the medicine you take for your breathing.  SEEK IMMEDIATE MEDICAL CARE IF:  You have a rapid heart rate.   You have agitation, confusion, tremors, or are in a stupor (family members may need to observe this).   It becomes difficult to breathe.   You develop chest pain.   You have an oral temperature above 100.5, not controlled by medicine.  MAKE SURE YOU:   Understand these instructions.   Will watch your condition.   Will get help right away if you are not doing well or get worse.  Document Released:  06/22/2005 Document Re-Released: 12/07/2009 [facility] Patient Information 2011 [place], [place].Arthritis - Degenerative, Osteoarthritis You have osteoarthritis. This is the wear and tear arthritis that comes with aging. It is also called degenerative arthritis. This is common in people past middle age. It is caused by stress on the joints from living. The large weight bearing joints of the lower extremities are most often affected. The knees, hips, back, neck, and hands can become painful, swollen, and stiff. This is the most common type of arthritis. It comes on with age, carrying too much weight, and from injury. Treatment includes resting the sore joint until the pain and swelling improve. Crutches or a walker may be needed for severe flares. Only take over-the-counter or prescription medicines for pain, discomfort, or fever as directed by your caregiver. Local heat therapy may improve motion. Cortisone shots into the joint are sometimes used to reduce pain and swelling during flares. Osteoarthritis is usually not crippling and progresses slowly. There are things you can do to decrease pain:  Avoid high impact activities.   Exercise regularly.   Low impact exercises such as walking, biking and swimming help to keep the muscles strong and keep normal joint function.   Stretching helps to keep your range of motion.   Lose weight if you are overweight. This reduces joint stress.  In severe cases when you have pain at rest or increasing disability, joint surgery may be helpful. See your caregiver for follow-up treatment as recommended.  SEEK IMMEDIATE MEDICAL CARE IF:  You have severe joint pain.  Marked swelling and redness in your joint develops.   You develop a high fever.  Document Released: 09/12/2005 Document Re-Released: 01/12/2008 [facility] Patient Information 2011 [place], [place].

## 2011-03-15 NOTE — Assessment & Plan Note (Signed)
BP is well controlled 

## 2011-03-15 NOTE — Assessment & Plan Note (Signed)
He tells me that Staxin "did not work" so I will check labs to look for secondary causes and try viagra

## 2011-03-15 NOTE — Assessment & Plan Note (Signed)
Check PSA and UA.

## 2011-03-15 NOTE — Assessment & Plan Note (Signed)
Try celebrex and he was given pt ed material about djd

## 2011-03-16 ENCOUNTER — Encounter: Payer: Self-pay | Admitting: Internal Medicine

## 2011-03-16 LAB — TESTOSTERONE, FREE, TOTAL, SHBG
Sex Hormone Binding: 26 nmol/L (ref 13–71)
Testosterone, Free: 96.2 pg/mL (ref 47.0–244.0)
Testosterone-% Free: 2.3 % (ref 1.6–2.9)

## 2011-03-16 LAB — GC/CHLAMYDIA PROBE AMP, URINE: Chlamydia, Swab/Urine, PCR: NEGATIVE

## 2011-03-18 ENCOUNTER — Ambulatory Visit: Payer: Self-pay | Admitting: Cardiovascular Disease

## 2011-03-21 ENCOUNTER — Telehealth: Payer: Self-pay | Admitting: Internal Medicine

## 2011-03-21 MED ORDER — ALBUTEROL SULFATE (2.5 MG/3ML) 0.083% IN NEBU: 2.5000 mg | INHALATION_SOLUTION | RESPIRATORY_TRACT | Status: DC | PRN

## 2011-03-21 MED ORDER — ALBUTEROL SULFATE HFA 108 (90 BASE) MCG/ACT IN AERS: 2.0000 | INHALATION_SPRAY | Freq: Four times a day (QID) | RESPIRATORY_TRACT | Status: DC | PRN

## 2011-03-21 MED ORDER — AZELASTINE HCL 0.1 % NA SOLN: 1.0000 | Freq: Two times a day (BID) | NASAL | Status: DC

## 2011-03-21 NOTE — Telephone Encounter (Signed)
Pt request refiil on albuterol neb, proair and astelin nasal spray. rx sent. Pt aware. Carron Curie, CMA

## 2011-03-23 ENCOUNTER — Encounter (HOSPITAL_COMMUNITY): Payer: Medicare Other | Admitting: Licensed Clinical Social Worker

## 2011-03-24 ENCOUNTER — Other Ambulatory Visit: Payer: Medicare Other | Admitting: Internal Medicine

## 2011-04-05 ENCOUNTER — Encounter (HOSPITAL_COMMUNITY): Payer: Medicare Other | Admitting: Physician Assistant

## 2011-04-05 DIAGNOSIS — F200 Paranoid schizophrenia: Secondary | ICD-10-CM

## 2011-04-07 ENCOUNTER — Inpatient Hospital Stay (INDEPENDENT_AMBULATORY_CARE_PROVIDER_SITE_OTHER)
Admission: RE | Admit: 2011-04-07 | Discharge: 2011-04-07 | Disposition: A | Discharge status: Home / Self Care | Payer: Medicare Other | Source: Ambulatory Visit | Attending: Emergency Medicine | Admitting: Emergency Medicine

## 2011-04-07 DIAGNOSIS — J310 Chronic rhinitis: Secondary | ICD-10-CM

## 2011-04-13 ENCOUNTER — Encounter (HOSPITAL_BASED_OUTPATIENT_CLINIC_OR_DEPARTMENT_OTHER): Payer: Medicare Other | Admitting: Licensed Clinical Social Worker

## 2011-04-13 DIAGNOSIS — F200 Paranoid schizophrenia: Secondary | ICD-10-CM

## 2011-04-20 ENCOUNTER — Encounter (HOSPITAL_COMMUNITY): Payer: Medicare Other | Admitting: Licensed Clinical Social Worker

## 2011-04-25 ENCOUNTER — Encounter (HOSPITAL_BASED_OUTPATIENT_CLINIC_OR_DEPARTMENT_OTHER): Payer: Medicare Other | Admitting: Licensed Clinical Social Worker

## 2011-04-25 DIAGNOSIS — F200 Paranoid schizophrenia: Secondary | ICD-10-CM

## 2011-04-26 ENCOUNTER — Ambulatory Visit (INDEPENDENT_AMBULATORY_CARE_PROVIDER_SITE_OTHER): Payer: Medicare Other | Admitting: Internal Medicine

## 2011-04-26 ENCOUNTER — Encounter: Payer: Self-pay | Admitting: Internal Medicine

## 2011-04-26 VITALS — BP 112/60 | HR 84 | Temp 97.8°F | Ht 75.0 in | Wt 223.0 lb

## 2011-04-26 DIAGNOSIS — J4489 Other specified chronic obstructive pulmonary disease: Secondary | ICD-10-CM

## 2011-04-26 DIAGNOSIS — F17200 Nicotine dependence, unspecified, uncomplicated: Secondary | ICD-10-CM

## 2011-04-26 DIAGNOSIS — J449 Chronic obstructive pulmonary disease, unspecified: Secondary | ICD-10-CM

## 2011-04-26 NOTE — Assessment & Plan Note (Signed)
3 min counseling. He does not want to quit due to grief over mom's death and per advise of grief counselor. Will discuss again at next office visit. AGain reeducatd about dangeers of continued smoking

## 2011-04-26 NOTE — Patient Instructions (Signed)
Will walk you for oxygen levels Will check alpha 1 genetic test for copd Continue daily spiriva Have flu shot in fall Continue regular exercises at [facility] Stop smoking when you can Return in 6-9 months or sooner if needed

## 2011-04-26 NOTE — Assessment & Plan Note (Addendum)
Stable gold stage 2 copd. Hardly much dyspnea  PLAN Continue spiriva Continue daily exercises at [facility] (so no need for rehab referral) Will check spiro next visit Will check alpha 1 today Will send message to cards to see if he can come off coreg

## 2011-04-26 NOTE — Progress Notes (Signed)
Subjective:    Patient ID: [name], male    DOB: Apr 21, 1961, 50 y.o.   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 Hx of thrush with symbicort June 2011. Also, tobacco abuse (failed chantix and nicotine gum)  July 31,, 2012 OV: Followup COPD and Tobacco abuse. STable health overall since last ov 10/20/2010. No dyspnea. Works out [employer] 3-5 times a week; treadmill 2 miles. Does not use o2. Still has baseline unchanged dry cough that is rated as moderate and worsens with heat and associated with smoking. Not ready to quit smoking - mom died, she was his "life". Grief counselor advised not to quit. Wants to rediscuss quitting smoking at next office visit. Wanted to walk him in office for o2 levels but he had to go catch bus  Past Medical History  Diagnosis Date  . PVD (peripheral vascular disease)   . Cough   . Rhinitis   . Chronic airway obstruction, not elsewhere classified   . Family history of colonic polyps   . Family history of malignant neoplasm of gastrointestinal tract   . Personal history of colonic polyps   . Dysphagia, unspecified   . Thrombocytopenia, unspecified   . 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 Tobacco use disorder   . Lumbago   . Type II or unspecified type diabetes mellitus with unspecified complication, not stated as uncontrolled   . Hypertension     MIXED  . Hypertrophy of prostate with urinary obstruction and other lower urinary tract symptoms (LUTS)      Family History  Problem Relation Age of Onset  . Colon cancer Mother   . Stroke Mother   . Cancer Mother     colon  . Diabetes      3/6 siblings  . Alcohol abuse    . Arthritis    . Hypertension    . Hyperlipidemia    . Heart disease Father   . Heart attack Father   . Colon polyps Sister      History   Social History  . Marital Status: Single    Spouse  Name: N/A    Number of Children: N/A  . Years of Education: N/A   Occupational History  . disabled, used to work in [employer], Astronomer    Social History Main Topics  . Smoking status: Current Everyday Smoker -- 2.0 packs/day for 38 years    Types: Cigarettes  . Smokeless tobacco: Not on file  . Alcohol Use: No  . Drug Use: No  . Sexually Active: Yes    Birth Control/ Protection: Condom   Other Topics Concern  . Not on file   Social History Narrative  . No narrative on file     Allergies  Allergen Reactions  . Ace Inhibitors     REACTION: cough  . Clopidogrel Bisulfate     REACTION: nose bleed     Outpatient Prescriptions Prior to Visit  Medication Sig Dispense Refill  . albuterol (PROAIR HFA) 108 (90 BASE) MCG/ACT inhaler Inhale 2 puffs into the lungs every 6 (six) hours as needed.  3 Inhaler  1  . albuterol (PROVENTIL) (2.5 MG/3ML) 0.083% nebulizer solution Take 3 mLs (2.5 mg total) by nebulization every 4 (four) hours as needed.  260 mL  3  . ALPRAZolam (XANAX) 1 MG tablet Take 1 tablet (  1 mg total) by mouth 3 (three) times daily as needed.  60 tablet  5  . azelastine (ASTELIN) 137 MCG/SPRAY nasal spray Place 1 spray into the nose 2 (two) times daily. Use in each nostril as directed  90 mL  2  . Blood Glucose Calibration (ELEMENT CONTROL) NORMAL LIQD as directed.        . Blood Glucose Monitoring Suppl (ELEMENT AUTOCODE SYSTEM) W/DEVICE KIT        . carvedilol (COREG) 12.5 MG tablet Take 12.5 mg by mouth 2 (two) times daily with a meal.        . celecoxib (CELEBREX) 200 MG capsule Take 1 capsule (200 mg total) by mouth daily.  30 capsule  2  . chlorproMAZINE (THORAZINE) 25 MG tablet Take 25 mg by mouth 2 (two) times daily.        Marland Kitchen esomeprazole (NEXIUM) 40 MG capsule Take 40 mg by mouth daily before breakfast.        . glucose blood (ELEMENT TEST) test strip 1 each by Other route as needed. Use as instructed       . Hydrocodone-Acetaminophen 10-660 MG TABS  Take 1 tablet by mouth 4 (four) times daily as needed.  120 each  5  . PARoxetine (PAXIL) 30 MG tablet Take 30 mg by mouth every morning.        . rosuvastatin (CRESTOR) 20 MG tablet Take 20 mg by mouth daily.        . sildenafil (VIAGRA) 100 MG tablet Take 1 tablet (100 mg total) by mouth as needed for erectile dysfunction.  12 tablet  11  . tiotropium (SPIRIVA) 18 MCG inhalation capsule Place 18 mcg into inhaler and inhale daily.        . traZODone (DESYREL) 100 MG tablet Take 100 mg by mouth at bedtime. 3 TABS QHS       . varenicline (CHANTIX STARTING MONTH PAK) 0.5 MG X 11 & 1 MG X 42 tablet Take by mouth 2 (two) times daily. Take one 0.5mg  tablet by mouth once daily for 3 days, then increase to one 0.5mg  tablet twice daily for 3 days, then increase to one 1mg  tablet twice daily.          Review of Systems  Constitutional: Negative for fever and unexpected weight change.  HENT: Negative for ear pain, nosebleeds, congestion, sore throat, rhinorrhea, sneezing, trouble swallowing, dental problem, postnasal drip and sinus pressure.   Eyes: Negative for redness and itching.  Respiratory: Negative for cough, chest tightness, shortness of breath and wheezing.   Cardiovascular: Negative for palpitations and leg swelling.  Gastrointestinal: Negative for nausea and vomiting.  Genitourinary: Negative for dysuria.  Musculoskeletal: Negative for joint swelling.  Skin: Negative for rash.  Neurological: Negative for headaches.  Hematological: Does not bruise/bleed easily.  Psychiatric/Behavioral: Negative for dysphoric mood. The patient is not nervous/anxious.        Objective:   Physical Exam  Nursing note and vitals reviewed. Constitutional: He is oriented to person, place, and time. He appears well-developed and well-nourished. No distress.  HENT:  Head: Normocephalic and atraumatic.  Right Ear: External ear normal.  Left Ear: External ear normal.  Mouth/Throat: Oropharynx is clear and  moist. No oropharyngeal exudate.       Scar of tracheostomy +  Eyes: Conjunctivae and EOM are normal. Pupils are equal, round, and reactive to light. Right eye exhibits no discharge. Left eye exhibits no discharge. No scleral icterus.  Neck: Normal range of motion.  Neck supple. No JVD present. No tracheal deviation present. No thyromegaly present.  Cardiovascular: Normal rate, regular rhythm and intact distal pulses.  Exam reveals no gallop and no friction rub.   No murmur heard. Pulmonary/Chest: Effort normal and breath sounds normal. No respiratory distress. He has no wheezes. He has no rales. He exhibits no tenderness.  Abdominal: Soft. Bowel sounds are normal. He exhibits no distension and no mass. There is no tenderness. There is no rebound and no guarding.  Musculoskeletal: Normal range of motion. He exhibits no edema and no tenderness.  Lymphadenopathy:    He has no cervical adenopathy.  Neurological: He is alert and oriented to person, place, and time. He has normal reflexes. No cranial nerve deficit. Coordination normal.  Skin: Skin is warm and dry. No rash noted. He is not diaphoretic. No erythema. No pallor.  Psychiatric: He has a normal mood and affect. His behavior is normal. Judgment and thought content normal.          Assessment & Plan:   No problem-specific assessment & plan notes found for this encounter.

## 2011-04-27 ENCOUNTER — Encounter (HOSPITAL_COMMUNITY): Payer: Medicare Other | Admitting: Licensed Clinical Social Worker

## 2011-05-04 ENCOUNTER — Encounter (HOSPITAL_COMMUNITY): Payer: Medicare Other | Admitting: Licensed Clinical Social Worker

## 2011-05-10 ENCOUNTER — Encounter (HOSPITAL_COMMUNITY): Payer: Medicare Other | Admitting: Physician Assistant

## 2011-05-12 ENCOUNTER — Encounter (HOSPITAL_COMMUNITY): Payer: Medicare Other | Admitting: Licensed Clinical Social Worker

## 2011-05-19 ENCOUNTER — Encounter (HOSPITAL_COMMUNITY): Payer: Medicare Other | Admitting: Licensed Clinical Social Worker

## 2011-05-23 ENCOUNTER — Encounter: Payer: Self-pay | Admitting: Internal Medicine

## 2011-05-25 ENCOUNTER — Telehealth: Payer: Self-pay

## 2011-05-25 DIAGNOSIS — M1710 Unilateral primary osteoarthritis, unspecified knee: Secondary | ICD-10-CM

## 2011-05-25 DIAGNOSIS — M179 Osteoarthritis of knee, unspecified: Secondary | ICD-10-CM

## 2011-05-25 MED ORDER — CELECOXIB 200 MG PO CAPS: 200.0000 mg | ORAL_CAPSULE | Freq: Every day | ORAL | Status: DC

## 2011-05-25 NOTE — Telephone Encounter (Signed)
Rx sent to pharmacy via escript, pt is already aware.

## 2011-05-25 NOTE — Telephone Encounter (Signed)
Patient called requesting refill on celebrex 200mg . He currently takes it once daily and wanted to know if ok to increase to bid. Please advise

## 2011-05-25 NOTE — Telephone Encounter (Signed)
No stay at once a day

## 2011-05-26 ENCOUNTER — Encounter (HOSPITAL_COMMUNITY): Payer: Medicare Other | Admitting: Licensed Clinical Social Worker

## 2011-05-27 ENCOUNTER — Encounter (HOSPITAL_COMMUNITY): Payer: Medicare Other | Admitting: Licensed Clinical Social Worker

## 2011-05-27 ENCOUNTER — Other Ambulatory Visit: Payer: Self-pay | Admitting: *Deleted

## 2011-05-27 DIAGNOSIS — M1710 Unilateral primary osteoarthritis, unspecified knee: Secondary | ICD-10-CM

## 2011-05-27 DIAGNOSIS — M179 Osteoarthritis of knee, unspecified: Secondary | ICD-10-CM

## 2011-05-27 MED ORDER — CELECOXIB 200 MG PO CAPS: 200.0000 mg | ORAL_CAPSULE | Freq: Every day | ORAL | Status: DC

## 2011-06-07 ENCOUNTER — Encounter: Payer: Self-pay | Admitting: Internal Medicine

## 2011-06-09 ENCOUNTER — Encounter: Payer: Self-pay | Admitting: Internal Medicine

## 2011-06-09 ENCOUNTER — Ambulatory Visit (INDEPENDENT_AMBULATORY_CARE_PROVIDER_SITE_OTHER): Payer: Medicare Other | Admitting: Internal Medicine

## 2011-06-09 ENCOUNTER — Telehealth: Payer: Self-pay | Admitting: *Deleted

## 2011-06-09 VITALS — BP 122/82 | HR 92 | Temp 99.0°F | Resp 16 | Wt 223.0 lb

## 2011-06-09 DIAGNOSIS — J209 Acute bronchitis, unspecified: Secondary | ICD-10-CM

## 2011-06-09 DIAGNOSIS — J441 Chronic obstructive pulmonary disease with (acute) exacerbation: Secondary | ICD-10-CM

## 2011-06-09 MED ORDER — METHYLPREDNISOLONE ACETATE 80 MG/ML IJ SUSP
120.0000 mg | Freq: Once | INTRAMUSCULAR | Status: AC
  Administered 2011-06-09: 120 mg via INTRAMUSCULAR

## 2011-06-09 MED ORDER — CEFUROXIME AXETIL 500 MG PO TABS: 500.0000 mg | ORAL_TABLET | Freq: Two times a day (BID) | ORAL | Status: AC

## 2011-06-09 NOTE — Progress Notes (Signed)
  Subjective:    Patient ID: [name], male    DOB: [birth year], 50 y.o.   MRN: [id number]  URI  This is a new problem. The current episode started 1 to 4 weeks ago. The problem has been gradually worsening. The maximum temperature recorded prior to his arrival was 100 - 100.9 F. The fever has been present for 3 to 4 days. Associated symptoms include congestion, coughing, rhinorrhea, sinus pain, a sore throat and wheezing. Pertinent negatives include no abdominal pain, chest pain, diarrhea, dysuria, headaches, joint pain, joint swelling, nausea, neck pain, plugged ear sensation, rash, sneezing, swollen glands or vomiting. He has tried inhaler use for the symptoms. The treatment provided mild relief.      Review of Systems  Constitutional: Negative for fever, chills, diaphoresis, activity change, appetite change, fatigue and unexpected weight change.  HENT: Positive for congestion, sore throat and rhinorrhea. Negative for nosebleeds, sneezing, drooling, mouth sores, trouble swallowing, neck pain, dental problem, voice change, postnasal drip and sinus pressure.   Eyes: Negative.   Respiratory: Positive for cough and wheezing. Negative for apnea, choking, chest tightness, shortness of breath and stridor.   Cardiovascular: Negative for chest pain, palpitations and leg swelling.  Gastrointestinal: Negative for nausea, vomiting, abdominal pain, diarrhea, constipation, blood in stool, abdominal distention, anal bleeding and rectal pain.  Genitourinary: Negative for dysuria, urgency, frequency, hematuria, flank pain, decreased urine volume, enuresis and difficulty urinating.  Musculoskeletal: Negative for myalgias, back pain, joint pain, joint swelling, arthralgias and gait problem.  Skin: Negative for color change, pallor, rash and wound.  Neurological: Negative for dizziness, tremors, seizures, syncope, facial asymmetry, speech difficulty, weakness, light-headedness, numbness and headaches.    Hematological: Negative for adenopathy. Does not bruise/bleed easily.  Psychiatric/Behavioral: Negative.        Objective:   Physical Exam  Vitals reviewed. Constitutional: He is oriented to person, place, and time. He appears well-developed and well-nourished. No distress.  HENT:  Mouth/Throat: Oropharynx is clear and moist. No oropharyngeal exudate.  Eyes: Conjunctivae are normal. Right eye exhibits no discharge. Left eye exhibits no discharge. No scleral icterus.  Neck: Normal range of motion. Neck supple. No JVD present. No tracheal deviation present. No thyromegaly present.  Cardiovascular: Normal rate, regular rhythm, normal heart sounds and intact distal pulses.  Exam reveals no gallop and no friction rub.   No murmur heard. Pulmonary/Chest: Effort normal. No accessory muscle usage or stridor. Not tachypneic. No respiratory distress. He has no decreased breath sounds. He has wheezes in the right middle field and the left middle field. He has no rhonchi. He has no rales.  Abdominal: Soft. Bowel sounds are normal. He exhibits no distension and no mass. There is no tenderness. There is no rebound and no guarding.  Musculoskeletal: Normal range of motion. He exhibits no edema and no tenderness.  Lymphadenopathy:    He has no cervical adenopathy.  Neurological: He is oriented to person, place, and time. He displays normal reflexes. He exhibits normal muscle tone. Coordination normal.  Skin: Skin is warm and dry. No rash noted. He is not diaphoretic. No erythema. No pallor.  Psychiatric: He has a normal mood and affect. His behavior is normal. Judgment and thought content normal.          Assessment & Plan:

## 2011-06-09 NOTE — Assessment & Plan Note (Signed)
Start ceftin for the infection 

## 2011-06-09 NOTE — Assessment & Plan Note (Signed)
Treat  With depo-medrol IM to reduce the wheezing and inflammation

## 2011-06-09 NOTE — Telephone Encounter (Signed)
Spoke w/pt - He c/o chest tightness and sob. No CP. He has had fever this week. C/o cough and wheezing - using inhalers as directed. Scheduled for OV today and directed to call office or go to ER w/any change in symptoms.

## 2011-06-09 NOTE — Patient Instructions (Signed)
Acute Bronchitis You have acute bronchitis. This means you have a chest cold. The airways in your lungs are inflamed (red and sore). Acute means it is sudden onset. Bronchitis is most often caused by a virus. In smokers, people with chronic lung problems, and elderly patients, treatment with antibiotics for bacterial infection may be needed. Exposure to cigarette smoke or irritating chemicals will make bronchitis worse. Allergies and asthma can also make bronchitis worse. Repeated episodes of bronchitis may cause long standing lung problems. Acute bronchitis is usually treated with rest, fluids, and medicines for relief of fever or cough. Bronchodilator medicines from metered inhalers or a nebulizer may be used to help open up the small airways. This reduces shortness of breath and helps control cough. Antibiotics can be prescribed if you are more seriously ill or at risk. A cool air vaporizer may help thin bronchial secretions and make it easier to clear your chest. Increased fluids may also help. You must avoid smoking, even second hand exposure. If you are a cigarette smoker, consider using nicotine gum or skin patches to help control withdrawal symptoms. Recovery from bronchitis is often slow, but you should start feeling better after 2-3 days. Cough from bronchitis frequently lasts for 3-4 weeks.  SEEK IMMEDIATE MEDICAL CARE IF YOU DEVELOP:  Increased fever, chills, or chest pain.   Severe shortness of breath or bloody sputum.   Dehydration, fainting, repeated vomiting, severe headache.   No improvement after one week of proper treatment.  MAKE SURE YOU:   Understand these instructions.   Will watch your condition.   Will get help right away if you are not doing well or get worse.  Document Released: 10/20/2004 Document Re-Released: 08/25/2008 [employer] Patient Information 2011 [employer]. 

## 2011-06-15 ENCOUNTER — Ambulatory Visit (INDEPENDENT_AMBULATORY_CARE_PROVIDER_SITE_OTHER): Payer: Medicare Other | Admitting: Internal Medicine

## 2011-06-15 ENCOUNTER — Encounter: Payer: Self-pay | Admitting: Internal Medicine

## 2011-06-15 DIAGNOSIS — IMO0002 Reserved for concepts with insufficient information to code with codable children: Secondary | ICD-10-CM

## 2011-06-15 DIAGNOSIS — E782 Mixed hyperlipidemia: Secondary | ICD-10-CM

## 2011-06-15 DIAGNOSIS — M179 Osteoarthritis of knee, unspecified: Secondary | ICD-10-CM

## 2011-06-15 DIAGNOSIS — E118 Type 2 diabetes mellitus with unspecified complications: Secondary | ICD-10-CM

## 2011-06-15 DIAGNOSIS — D696 Thrombocytopenia, unspecified: Secondary | ICD-10-CM

## 2011-06-15 DIAGNOSIS — R509 Fever, unspecified: Secondary | ICD-10-CM

## 2011-06-15 DIAGNOSIS — I10 Essential (primary) hypertension: Secondary | ICD-10-CM

## 2011-06-15 DIAGNOSIS — M1710 Unilateral primary osteoarthritis, unspecified knee: Secondary | ICD-10-CM

## 2011-06-15 DIAGNOSIS — F341 Dysthymic disorder: Secondary | ICD-10-CM

## 2011-06-15 DIAGNOSIS — F418 Other specified anxiety disorders: Secondary | ICD-10-CM | POA: Insufficient documentation

## 2011-06-15 DIAGNOSIS — B181 Chronic viral hepatitis B without delta-agent: Secondary | ICD-10-CM

## 2011-06-15 DIAGNOSIS — J449 Chronic obstructive pulmonary disease, unspecified: Secondary | ICD-10-CM

## 2011-06-15 MED ORDER — TIOTROPIUM BROMIDE MONOHYDRATE 18 MCG IN CAPS: 18.0000 ug | ORAL_CAPSULE | Freq: Every day | RESPIRATORY_TRACT | Status: DC

## 2011-06-15 MED ORDER — CARVEDILOL 12.5 MG PO TABS
12.5000 mg | ORAL_TABLET | Freq: Two times a day (BID) | ORAL | Status: DC
Start: 2011-06-15 — End: 2012-09-11

## 2011-06-15 MED ORDER — ALPRAZOLAM 1 MG PO TABS: 1.0000 mg | ORAL_TABLET | Freq: Three times a day (TID) | ORAL | Status: DC | PRN

## 2011-06-15 MED ORDER — PAROXETINE HCL 30 MG PO TABS: 30.0000 mg | ORAL_TABLET | ORAL | Status: DC

## 2011-06-15 MED ORDER — HYDROCODONE-ACETAMINOPHEN 10-660 MG PO TABS: 1.0000 | ORAL_TABLET | Freq: Four times a day (QID) | ORAL | Status: DC | PRN

## 2011-06-15 NOTE — Progress Notes (Signed)
Subjective:    Patient ID: [name], male    DOB: 04/01/1961, 50 y.o.   MRN: [id number]  Hypertension This is a chronic problem. The current episode started more than 1 year ago. The problem has been gradually improving since onset. The problem is controlled. Associated symptoms include anxiety. Pertinent negatives include no blurred vision, chest pain, headaches, malaise/fatigue, neck pain, orthopnea, palpitations, peripheral edema, PND, shortness of breath or sweats. There are no associated agents to hypertension. Past treatments include beta blockers. The current treatment provides moderate improvement. Compliance problems include exercise and diet.  There is no history of chronic renal disease.  Diabetes He presents for his follow-up diabetic visit. He has type 2 diabetes mellitus. His disease course has been improving. Hypoglycemia symptoms include nervousness/anxiousness. Pertinent negatives for hypoglycemia include no confusion, dizziness, headaches, hunger, mood changes, pallor, seizures, sleepiness, speech difficulty, sweats or tremors. Pertinent negatives for diabetes include no blurred vision, no chest pain, no fatigue, no foot paresthesias, no foot ulcerations, no polydipsia, no polyphagia, no polyuria, no visual change, no weakness and no weight loss. There are no hypoglycemic complications. Symptoms are stable. There are no diabetic complications. Risk factors for coronary artery disease include no known risk factors. Current diabetic treatment includes diet. He is compliant with treatment all of the time. His weight is stable. He is following a generally healthy diet. Meal planning includes avoidance of concentrated sweets. He never participates in exercise. There is no change in his home blood glucose trend. An ACE inhibitor/angiotensin II receptor blocker is not being taken.  Hyperlipidemia This is a chronic problem. The current episode started more than 1 year ago. The problem is  controlled. Recent lipid tests were reviewed and are variable. Exacerbating diseases include diabetes, liver disease and obesity. He has no history of chronic renal disease, hypothyroidism or nephrotic syndrome. Factors aggravating his hyperlipidemia include beta blockers. Pertinent negatives include no chest pain, focal sensory loss, focal weakness, leg pain, myalgias or shortness of breath. Current antihyperlipidemic treatment includes statins. The current treatment provides moderate improvement of lipids. Compliance problems include psychosocial issues, adherence to exercise and adherence to diet.       Review of Systems  Constitutional: Negative for fever, chills, weight loss, malaise/fatigue, diaphoresis, activity change, appetite change, fatigue and unexpected weight change.  HENT: Negative for sore throat, facial swelling, trouble swallowing, neck pain and voice change.   Eyes: Negative.  Negative for blurred vision.  Respiratory: Negative for apnea, cough, choking, chest tightness, shortness of breath, wheezing and stridor.   Cardiovascular: Negative for chest pain, palpitations, orthopnea, leg swelling and PND.  Gastrointestinal: Negative for nausea, vomiting, abdominal pain, diarrhea, constipation, blood in stool, abdominal distention and anal bleeding.  Genitourinary: Negative for dysuria, urgency, polyuria, frequency, hematuria, flank pain, decreased urine volume, enuresis and difficulty urinating.  Musculoskeletal: Positive for back pain and arthralgias. Negative for myalgias, joint swelling and gait problem.  Skin: Negative for color change, pallor, rash and wound.  Neurological: Negative for dizziness, tremors, focal weakness, seizures, syncope, facial asymmetry, speech difficulty, weakness, light-headedness, numbness and headaches.  Hematological: Negative for polydipsia, polyphagia and adenopathy. Does not bruise/bleed easily.  Psychiatric/Behavioral: Negative for suicidal ideas,  hallucinations, behavioral problems, confusion, sleep disturbance, self-injury, dysphoric mood, decreased concentration and agitation. The patient is nervous/anxious. The patient is not hyperactive.        Objective:   Physical Exam  Vitals reviewed. Constitutional: He is oriented to person, place, and time. He appears well-developed and well-nourished. No distress.  HENT:  Mouth/Throat: Oropharynx is clear and moist. No oropharyngeal exudate.  Eyes: Conjunctivae are normal. Right eye exhibits no discharge. Left eye exhibits no discharge. No scleral icterus.  Neck: Normal range of motion. Neck supple. No JVD present. No tracheal deviation present. No thyromegaly present.  Cardiovascular: Normal rate, regular rhythm, normal heart sounds and intact distal pulses.  Exam reveals no gallop and no friction rub.   No murmur heard. Pulmonary/Chest: Effort normal and breath sounds normal. No stridor. No respiratory distress. He has no wheezes. He has no rales. He exhibits no tenderness.  Abdominal: Soft. Bowel sounds are normal. He exhibits no distension and no mass. There is no tenderness. There is no rebound and no guarding.  Musculoskeletal: Normal range of motion. He exhibits no edema and no tenderness.  Lymphadenopathy:    He has no cervical adenopathy.  Neurological: He is oriented to person, place, and time. He displays normal reflexes. He exhibits normal muscle tone.  Skin: Skin is warm and dry. No rash noted. He is not diaphoretic. No erythema. No pallor.  Psychiatric: He has a normal mood and affect. His behavior is normal. Judgment and thought content normal.          Assessment & Plan:

## 2011-06-15 NOTE — Assessment & Plan Note (Signed)
Check FLP and continue crestor

## 2011-06-15 NOTE — Assessment & Plan Note (Signed)
Check LFTs 

## 2011-06-15 NOTE — Assessment & Plan Note (Signed)
BP is well controlled 

## 2011-06-15 NOTE — Assessment & Plan Note (Signed)
Check A1C 

## 2011-06-15 NOTE — Assessment & Plan Note (Signed)
Fever has resloved, will check HIV abs today

## 2011-06-15 NOTE — Assessment & Plan Note (Signed)
CBC today.  

## 2011-06-15 NOTE — Assessment & Plan Note (Signed)
Continue current meds 

## 2011-06-15 NOTE — Assessment & Plan Note (Signed)
Continue meds for intermittent joint pain

## 2011-06-15 NOTE — Assessment & Plan Note (Signed)
This has stabilized.  

## 2011-06-15 NOTE — Patient Instructions (Signed)
Chronic Obstructive Pulmonary Disease (COPD) Chronic obstructive pulmonary disease (COPD) is a condition in which airflow from the lungs is restricted. The lungs can never return to normal, but there are measures you can take which will improve them and make you feel better. CAUSES  Smoking.   Breathing in irritants (pollution, cigarette smoke, strong odors, aerosol sprays, paint fumes).   History of lung infections.  TREATMENT  Treatment focuses on making you comfortable (supportive care).  HOME CARE INSTRUCTIONS  If you smoke, stop smoking. The carbon monoxide buildup in the blood robs you of your already short oxygen supply.   Take medicines (antibiotics) that kill germs as directed.   Avoid antihistamines and cough syrups. They dry up your system and slow down the elimination of secretions. This decreases respiratory capacity and may lead to infections.   Drink enough water and fluids to keep your urine clear or pale yellow. This loosens secretions.   Use humidifiers at home and at your bedside if they do not make breathing difficult.   Receive all protective vaccines your caregiver suggests, especially pneumococcal and influenza.   Use home oxygen as suggested.  SEEK MEDICAL CARE IF:  You develop pus-like mucus (sputum).   You have an oral temperature above 100.5.   Breathing is more labored or exercise becomes difficult to do.   You are running out of the medicine you take for your breathing.  SEEK IMMEDIATE MEDICAL CARE IF:  You have a rapid heart rate.   You have agitation, confusion, tremors, or are in a stupor (family members may need to observe this).   It becomes difficult to breathe.   You develop chest pain.   You have an oral temperature above 100.5, not controlled by medicine.  MAKE SURE YOU:   Understand these instructions.   Will watch your condition.   Will get help right away if you are not doing well or get worse.  Document Released:  06/22/2005 Document Re-Released: 12/07/2009 [employer] Patient Information 2011 [employer]. 

## 2011-06-16 LAB — CBC
HCT: 29 — ABNORMAL LOW
HCT: 29.1 — ABNORMAL LOW
HCT: 29.2 — ABNORMAL LOW
HCT: 30.5 — ABNORMAL LOW
HCT: 30.9 — ABNORMAL LOW
Hemoglobin: 10 — ABNORMAL LOW
Hemoglobin: 10.4 — ABNORMAL LOW
Hemoglobin: 11.3 — ABNORMAL LOW
Hemoglobin: 9.2 — ABNORMAL LOW
Hemoglobin: 9.9 — ABNORMAL LOW
MCHC: 33.2
MCHC: 33.5
MCHC: 33.8
MCHC: 33.9
MCHC: 34
MCHC: 34.2
MCHC: 34.2
MCV: 86.9
MCV: 87.1
MCV: 87.1
MCV: 87.1
MCV: 87.3
MCV: 87.8
MCV: 87.9
MCV: 88.2
MCV: 88.4
MCV: 89.2
Platelets: 235
Platelets: 247
Platelets: 260
Platelets: 288
Platelets: 316
Platelets: 372
Platelets: 398
RBC: 3.08 — ABNORMAL LOW
RBC: 3.3 — ABNORMAL LOW
RBC: 3.34 — ABNORMAL LOW
RBC: 3.38 — ABNORMAL LOW
RBC: 3.44 — ABNORMAL LOW
RBC: 3.7 — ABNORMAL LOW
RDW: 14.7
RDW: 15.1
RDW: 15.6 — ABNORMAL HIGH
RDW: 15.6 — ABNORMAL HIGH
RDW: 15.7 — ABNORMAL HIGH
RDW: 15.8 — ABNORMAL HIGH
RDW: 15.8 — ABNORMAL HIGH
WBC: 13.4 — ABNORMAL HIGH
WBC: 14.3 — ABNORMAL HIGH
WBC: 14.5 — ABNORMAL HIGH
WBC: 16.1 — ABNORMAL HIGH
WBC: 17.4 — ABNORMAL HIGH
WBC: 18.4 — ABNORMAL HIGH
WBC: 20.1 — ABNORMAL HIGH
WBC: 21.4 — ABNORMAL HIGH

## 2011-06-16 LAB — POCT I-STAT 3, ART BLOOD GAS (G3+)
Acid-Base Excess: 2
Acid-Base Excess: 2
Acid-Base Excess: 3 — ABNORMAL HIGH
Acid-Base Excess: 3 — ABNORMAL HIGH
Acid-Base Excess: 7 — ABNORMAL HIGH
Acid-Base Excess: 7 — ABNORMAL HIGH
Bicarbonate: 23.2
Bicarbonate: 25.8 — ABNORMAL HIGH
Bicarbonate: 25.9 — ABNORMAL HIGH
Bicarbonate: 26.7 — ABNORMAL HIGH
Bicarbonate: 28 — ABNORMAL HIGH
Bicarbonate: 28.1 — ABNORMAL HIGH
Bicarbonate: 28.9 — ABNORMAL HIGH
Bicarbonate: 30.7 — ABNORMAL HIGH
Bicarbonate: 31.6 — ABNORMAL HIGH
O2 Saturation: 85
O2 Saturation: 89
O2 Saturation: 90
O2 Saturation: 92
O2 Saturation: 93
O2 Saturation: 94
O2 Saturation: 96
O2 Saturation: 96
O2 Saturation: 97
O2 Saturation: 97
O2 Saturation: 98
O2 Saturation: 99
Operator id: 234111
Operator id: 245011
Operator id: 245011
Operator id: 248711
Patient temperature: 100
Patient temperature: 100
Patient temperature: 100.4
Patient temperature: 102.3
Patient temperature: 102.9
Patient temperature: 36.9
Patient temperature: 97.7
Patient temperature: 99
Patient temperature: 99
Patient temperature: 99
Patient temperature: 99.5
Patient temperature: 99.7
TCO2: 23
TCO2: 27
TCO2: 27
TCO2: 27
TCO2: 28
TCO2: 29
TCO2: 30
TCO2: 30
TCO2: 33
TCO2: 33
pCO2 arterial: 27.8 — ABNORMAL LOW
pCO2 arterial: 33.9 — ABNORMAL LOW
pCO2 arterial: 37.7
pCO2 arterial: 44.6
pCO2 arterial: 47.1 — ABNORMAL HIGH
pCO2 arterial: 47.5 — ABNORMAL HIGH
pCO2 arterial: 53.1 — ABNORMAL HIGH
pH, Arterial: 7.249 — ABNORMAL LOW
pH, Arterial: 7.382
pH, Arterial: 7.417
pH, Arterial: 7.43
pH, Arterial: 7.431
pH, Arterial: 7.442
pH, Arterial: 7.451 — ABNORMAL HIGH
pH, Arterial: 7.469 — ABNORMAL HIGH
pH, Arterial: 7.483 — ABNORMAL HIGH
pH, Arterial: 7.542 — ABNORMAL HIGH
pO2, Arterial: 71 — ABNORMAL LOW
pO2, Arterial: 71 — ABNORMAL LOW
pO2, Arterial: 78 — ABNORMAL LOW
pO2, Arterial: 95

## 2011-06-16 LAB — URINALYSIS, ROUTINE W REFLEX MICROSCOPIC
Ketones, ur: NEGATIVE
Leukocytes, UA: NEGATIVE
Nitrite: NEGATIVE
Specific Gravity, Urine: 1.018
pH: 5

## 2011-06-16 LAB — FUNGUS CULTURE W SMEAR: Fungal Smear: NONE SEEN

## 2011-06-16 LAB — BASIC METABOLIC PANEL
BUN: 15
BUN: 15
BUN: 16
BUN: 23
BUN: 5 — ABNORMAL LOW
CO2: 23
CO2: 24
CO2: 24
CO2: 24
Calcium: 7.4 — ABNORMAL LOW
Calcium: 7.6 — ABNORMAL LOW
Calcium: 7.7 — ABNORMAL LOW
Calcium: 8 — ABNORMAL LOW
Chloride: 106
Chloride: 107
Chloride: 108
Chloride: 110
Chloride: 110
Chloride: 112
Creatinine, Ser: 0.74
Creatinine, Ser: 0.82
Creatinine, Ser: 0.9
Creatinine, Ser: 1.02
GFR calc Af Amer: >60
GFR calc Af Amer: >60
GFR calc Af Amer: >60
GFR calc Af Amer: >60
GFR calc non Af Amer: >60
GFR calc non Af Amer: >60
GFR calc non Af Amer: >60
Glucose, Bld: 128 — ABNORMAL HIGH
Glucose, Bld: 132 — ABNORMAL HIGH
Glucose, Bld: 133 — ABNORMAL HIGH
Glucose, Bld: 141 — ABNORMAL HIGH
Glucose, Bld: 156 — ABNORMAL HIGH
Glucose, Bld: 157 — ABNORMAL HIGH
Glucose, Bld: 161 — ABNORMAL HIGH
Potassium: 3.5
Potassium: 3.8
Sodium: 136
Sodium: 143

## 2011-06-16 LAB — PROTIME-INR
INR: 1
Prothrombin Time: 13.3

## 2011-06-16 LAB — COMPREHENSIVE METABOLIC PANEL
ALT: 24
ALT: 27
AST: 34
Albumin: 1.1 — ABNORMAL LOW
Albumin: 1.3 — ABNORMAL LOW
Alkaline Phosphatase: 100
Alkaline Phosphatase: 107
Alkaline Phosphatase: 99
BUN: 16
BUN: 17
CO2: 26
CO2: 27
CO2: 30
CO2: 31
Calcium: 8 — ABNORMAL LOW
Chloride: 108
Chloride: 110
Creatinine, Ser: 0.73
Creatinine, Ser: 1.1
GFR calc Af Amer: >60
GFR calc non Af Amer: >60
GFR calc non Af Amer: >60
GFR calc non Af Amer: >60
GFR calc non Af Amer: >60
Glucose, Bld: 113 — ABNORMAL HIGH
Glucose, Bld: 94
Potassium: 3.5
Potassium: 3.9
Potassium: 4
Sodium: 144
Sodium: 146 — ABNORMAL HIGH
Total Bilirubin: 0.2 — ABNORMAL LOW
Total Bilirubin: 0.2 — ABNORMAL LOW
Total Bilirubin: 0.3

## 2011-06-16 LAB — CULTURE, RESPIRATORY W GRAM STAIN: Culture: NO GROWTH

## 2011-06-16 LAB — HEPATIC FUNCTION PANEL
ALT: 72 — ABNORMAL HIGH
AST: 57 — ABNORMAL HIGH
Alkaline Phosphatase: 141 — ABNORMAL HIGH
Bilirubin, Direct: 0.3
Indirect Bilirubin: 0.1 — ABNORMAL LOW
Indirect Bilirubin: 0.3
Total Bilirubin: 0.5

## 2011-06-16 LAB — VITAMIN B12: Vitamin B-12: 655 (ref 211–911)

## 2011-06-16 LAB — DIFFERENTIAL
Basophils Absolute: 0
Basophils Absolute: 0
Basophils Relative: 0
Basophils Relative: 0
Eosinophils Absolute: 0.4
Eosinophils Relative: 4
Monocytes Absolute: 0.4
Monocytes Absolute: 0.7
Neutro Abs: 11 — ABNORMAL HIGH
Neutro Abs: 9.4 — ABNORMAL HIGH
Neutrophils Relative %: 82 — ABNORMAL HIGH

## 2011-06-16 LAB — CULTURE, BLOOD (ROUTINE X 2): Culture: NO GROWTH

## 2011-06-16 LAB — CARDIAC PANEL(CRET KIN+CKTOT+MB+TROPI)
Relative Index: 0.4
Relative Index: INVALID
Total CK: 40

## 2011-06-16 LAB — BLOOD GAS, ARTERIAL
FIO2: 0.4
MECHVT: 550
O2 Saturation: 95.6
PEEP: 5
Patient temperature: 98.6
RATE: 12
pO2, Arterial: 86.2

## 2011-06-16 LAB — LEGIONELLA PROFILE(CULTURE+DFA/SMEAR)

## 2011-06-16 LAB — URINE CULTURE
Colony Count: NO GROWTH
Culture: NO GROWTH
Special Requests: NEGATIVE

## 2011-06-16 LAB — RAPID URINE DRUG SCREEN, HOSP PERFORMED
Barbiturates: POSITIVE — AB
Benzodiazepines: POSITIVE — AB
Cocaine: NOT DETECTED
Opiates: NOT DETECTED

## 2011-06-16 LAB — CULTURE, BAL-QUANTITATIVE W GRAM STAIN: Culture: NO GROWTH

## 2011-06-16 LAB — CLOSTRIDIUM DIFFICILE EIA: C difficile Toxins A+B, EIA: NEGATIVE

## 2011-06-16 LAB — B-NATRIURETIC PEPTIDE (CONVERTED LAB)
Pro B Natriuretic peptide (BNP): 350 — ABNORMAL HIGH
Pro B Natriuretic peptide (BNP): 69
Pro B Natriuretic peptide (BNP): 92

## 2011-06-16 LAB — HIGH SENSITIVITY CRP: CRP, High Sensitivity: 214.7 — ABNORMAL HIGH

## 2011-06-16 LAB — AFB CULTURE WITH SMEAR (NOT AT ARMC): Acid Fast Smear: NONE SEEN

## 2011-06-16 LAB — URINE MICROSCOPIC-ADD ON

## 2011-06-16 LAB — MAGNESIUM
Magnesium: 2.3
Magnesium: 2.5

## 2011-06-16 LAB — SEDIMENTATION RATE: Sed Rate: 97 — ABNORMAL HIGH

## 2011-06-16 LAB — APTT: aPTT: 34

## 2011-06-16 LAB — PHOSPHORUS: Phosphorus: 4.6

## 2011-06-16 LAB — RETICULOCYTES
Retic Count, Absolute: 86.5
Retic Ct Pct: 2.2

## 2011-06-16 LAB — C4 COMPLEMENT: Complement C4, Body Fluid: 18

## 2011-06-16 LAB — IRON AND TIBC
Iron: 10 — ABNORMAL LOW
Saturation Ratios: 4 — ABNORMAL LOW
UIBC: 272

## 2011-06-16 LAB — C3 COMPLEMENT: C3 Complement: 105

## 2011-06-17 LAB — BASIC METABOLIC PANEL
BUN: 26 — ABNORMAL HIGH
CO2: 24
CO2: 25
Calcium: 8.5
Calcium: 8.7
Calcium: 8.9
Chloride: 104
Chloride: 105
Chloride: 111
Creatinine, Ser: 0.78
GFR calc Af Amer: >60
GFR calc Af Amer: >60
GFR calc Af Amer: >60
GFR calc non Af Amer: >60
GFR calc non Af Amer: >60
Glucose, Bld: 104 — ABNORMAL HIGH
Glucose, Bld: 87
Potassium: 3.9
Sodium: 136
Sodium: 137
Sodium: 139

## 2011-06-17 LAB — CBC
HCT: 30.4 — ABNORMAL LOW
HCT: 32 — ABNORMAL LOW
HCT: 32.1 — ABNORMAL LOW
HCT: 32.2 — ABNORMAL LOW
Hemoglobin: 10.3 — ABNORMAL LOW
Hemoglobin: 10.6 — ABNORMAL LOW
Hemoglobin: 10.8 — ABNORMAL LOW
Hemoglobin: 10.9 — ABNORMAL LOW
Hemoglobin: 11 — ABNORMAL LOW
MCHC: 33.8
MCHC: 33.9
MCV: 87.1
Platelets: 345
Platelets: 348
RBC: 3.52 — ABNORMAL LOW
RBC: 3.53 — ABNORMAL LOW
RBC: 3.7 — ABNORMAL LOW
RBC: 3.71 — ABNORMAL LOW
RBC: 3.79 — ABNORMAL LOW
RDW: 15.1
RDW: 15.3
RDW: 15.6 — ABNORMAL HIGH
WBC: 11 — ABNORMAL HIGH
WBC: 11.4 — ABNORMAL HIGH

## 2011-06-17 LAB — POCT I-STAT 3, ART BLOOD GAS (G3+)
Acid-Base Excess: 2
Bicarbonate: 26.6 — ABNORMAL HIGH
Operator id: 276051
pCO2 arterial: 38.8
pH, Arterial: 7.444
pO2, Arterial: 65 — ABNORMAL LOW

## 2011-06-17 LAB — DIFFERENTIAL
Basophils Absolute: 0.1
Basophils Relative: 1
Monocytes Relative: 9
Neutro Abs: 6.7
Neutrophils Relative %: 61

## 2011-06-17 LAB — COMPREHENSIVE METABOLIC PANEL
Alkaline Phosphatase: 104
BUN: 18
Glucose, Bld: 100 — ABNORMAL HIGH
Potassium: 3.8
Total Bilirubin: 1
Total Protein: 8

## 2011-06-17 LAB — HSV PCR

## 2011-06-17 LAB — TRIGLYCERIDES: Triglycerides: 152 — ABNORMAL HIGH

## 2011-06-17 LAB — HEPATIC FUNCTION PANEL
AST: 18
Albumin: 2.5 — ABNORMAL LOW
Alkaline Phosphatase: 98
Total Bilirubin: 0.5

## 2011-06-20 LAB — POCT CARDIAC MARKERS: Operator id: 285491

## 2011-06-20 LAB — I-STAT 8, (EC8 V) (CONVERTED LAB)
BUN: 5 — ABNORMAL LOW
Glucose, Bld: 97
HCT: 41
Hemoglobin: 13.9
Operator id: 285491
Potassium: 3.9
Sodium: 142
TCO2: 26

## 2011-06-21 LAB — POCT CARDIAC MARKERS
CKMB, poc: <1 — ABNORMAL LOW
Myoglobin, poc: 51.2
Operator id: 146091
Troponin i, poc: <0.05

## 2011-06-21 LAB — POCT I-STAT, CHEM 8
BUN: 8
Calcium, Ion: 1.13
Chloride: 110
HCT: 43
Potassium: 3.7

## 2011-06-21 LAB — D-DIMER, QUANTITATIVE: D-Dimer, Quant: 1.6 — ABNORMAL HIGH

## 2011-06-22 LAB — COMPREHENSIVE METABOLIC PANEL
ALT: 22
AST: 22
Albumin: 3.3 — ABNORMAL LOW
Alkaline Phosphatase: 83
Chloride: 113 — ABNORMAL HIGH
GFR calc Af Amer: >60
Potassium: 3.9
Sodium: 142
Total Bilirubin: 0.3
Total Protein: 6.2

## 2011-06-22 LAB — CK TOTAL AND CKMB (NOT AT ARMC)
Relative Index: 1.2
Total CK: 128

## 2011-06-22 LAB — URINALYSIS, ROUTINE W REFLEX MICROSCOPIC
Bilirubin Urine: NEGATIVE
Hgb urine dipstick: NEGATIVE
Ketones, ur: NEGATIVE
Nitrite: NEGATIVE
Urobilinogen, UA: 1
pH: 5.5

## 2011-06-22 LAB — TROPONIN I: Troponin I: 0.01

## 2011-06-22 LAB — DIFFERENTIAL
Basophils Absolute: 0
Basophils Relative: 1
Eosinophils Relative: 2
Monocytes Absolute: 0.7
Monocytes Relative: 10
Neutro Abs: 3.1

## 2011-06-22 LAB — CBC
HCT: 39.9
Platelets: 134 — ABNORMAL LOW
RDW: 15.9 — ABNORMAL HIGH
WBC: 7.3

## 2011-06-22 LAB — POCT CARDIAC MARKERS: Troponin i, poc: 0.08 — ABNORMAL HIGH

## 2011-06-24 LAB — POCT I-STAT, CHEM 8
Chloride: 108
HCT: 44
Hemoglobin: 15
Potassium: 3.9
Sodium: 141

## 2011-06-24 LAB — CBC
HCT: 40.8
MCHC: 34.5
MCV: 82.7
RBC: 4.94

## 2011-06-24 LAB — DIFFERENTIAL
Basophils Relative: 1
Eosinophils Absolute: 0.1
Eosinophils Relative: 1
Monocytes Absolute: 0.9
Monocytes Relative: 10
Neutrophils Relative %: 49

## 2011-06-24 LAB — POCT CARDIAC MARKERS
CKMB, poc: 1.6
CKMB, poc: <1 — ABNORMAL LOW
Myoglobin, poc: 71
Operator id: 270651
Troponin i, poc: <0.05
Troponin i, poc: <0.05

## 2011-06-24 LAB — PROTIME-INR: INR: 1

## 2011-06-27 ENCOUNTER — Telehealth: Payer: Self-pay | Admitting: *Deleted

## 2011-06-27 LAB — CBC
MCHC: 34
Platelets: 166
RDW: 17.2 — ABNORMAL HIGH

## 2011-06-27 LAB — URINALYSIS, ROUTINE W REFLEX MICROSCOPIC
Bilirubin Urine: NEGATIVE
Glucose, UA: >1000 — AB
Ketones, ur: NEGATIVE
Leukocytes, UA: NEGATIVE
Nitrite: NEGATIVE
Protein, ur: NEGATIVE
pH: 7

## 2011-06-27 LAB — DIFFERENTIAL
Basophils Absolute: 0
Basophils Relative: 0
Lymphocytes Relative: 40
Neutro Abs: 2.8

## 2011-06-27 LAB — POCT I-STAT, CHEM 8
BUN: 10
Creatinine, Ser: 0.9
Glucose, Bld: 366 — ABNORMAL HIGH
Hemoglobin: 13.9
Potassium: 3.8
Sodium: 137

## 2011-06-27 LAB — GLUCOSE, CAPILLARY: Glucose-Capillary: 351 — ABNORMAL HIGH

## 2011-06-27 NOTE — Telephone Encounter (Signed)
Pt. No show for previsit.  No answer on home phone.  Sent no show letter.   [name]

## 2011-06-28 ENCOUNTER — Encounter (HOSPITAL_COMMUNITY): Payer: Medicare Other | Admitting: Licensed Clinical Social Worker

## 2011-06-28 LAB — GLUCOSE, CAPILLARY: Glucose-Capillary: 117 — ABNORMAL HIGH

## 2011-06-29 ENCOUNTER — Encounter (HOSPITAL_COMMUNITY): Payer: Medicare Other | Admitting: Physician Assistant

## 2011-06-29 LAB — POCT I-STAT, CHEM 8
Calcium, Ion: 1.16
Calcium, Ion: 1.24
Chloride: 114 — ABNORMAL HIGH
HCT: 61 — ABNORMAL HIGH
Hemoglobin: 19 — ABNORMAL HIGH
Sodium: 137
Sodium: 143
TCO2: 8
TCO2: 8

## 2011-06-29 LAB — GLUCOSE, CAPILLARY
Glucose-Capillary: 138 — ABNORMAL HIGH
Glucose-Capillary: 154 — ABNORMAL HIGH
Glucose-Capillary: 155 — ABNORMAL HIGH
Glucose-Capillary: 156 — ABNORMAL HIGH
Glucose-Capillary: 177 — ABNORMAL HIGH
Glucose-Capillary: 180 — ABNORMAL HIGH
Glucose-Capillary: 181 — ABNORMAL HIGH
Glucose-Capillary: 213 — ABNORMAL HIGH
Glucose-Capillary: 220 — ABNORMAL HIGH
Glucose-Capillary: 225 — ABNORMAL HIGH
Glucose-Capillary: 252 — ABNORMAL HIGH
Glucose-Capillary: 267 — ABNORMAL HIGH
Glucose-Capillary: 406 — ABNORMAL HIGH
Glucose-Capillary: 445 — ABNORMAL HIGH
Glucose-Capillary: 64 — ABNORMAL LOW

## 2011-06-29 LAB — DIFFERENTIAL
Basophils Relative: 0
Eosinophils Absolute: 0
Eosinophils Relative: 0
Lymphs Abs: 1.3
Monocytes Absolute: 1
Monocytes Relative: 6
Neutrophils Relative %: 87 — ABNORMAL HIGH

## 2011-06-29 LAB — POCT I-STAT 3, VENOUS BLOOD GAS (G3P V)
Acid-base deficit: 23 — ABNORMAL HIGH
Acid-base deficit: 23 — ABNORMAL HIGH
O2 Saturation: 13
O2 Saturation: 68
TCO2: 10
pO2, Ven: 53 — ABNORMAL HIGH

## 2011-06-29 LAB — BASIC METABOLIC PANEL
BUN: 10
BUN: 10
BUN: 10
BUN: 14
BUN: 20
BUN: 22
BUN: 7
BUN: 8
BUN: 8
BUN: 8
BUN: 9
BUN: 9
CO2: 12 — ABNORMAL LOW
CO2: 16 — ABNORMAL LOW
CO2: 17 — ABNORMAL LOW
CO2: 23
CO2: 24
Calcium: 8.2 — ABNORMAL LOW
Calcium: 8.3 — ABNORMAL LOW
Calcium: 8.6
Calcium: 8.8
Calcium: 8.8
Calcium: 8.9
Calcium: 9
Calcium: 9
Calcium: 9.1
Chloride: 108
Chloride: 109
Chloride: 114 — ABNORMAL HIGH
Creatinine, Ser: 0.65
Creatinine, Ser: 0.73
Creatinine, Ser: 0.75
Creatinine, Ser: 0.77
Creatinine, Ser: 0.92
Creatinine, Ser: 1
GFR calc Af Amer: >60
GFR calc Af Amer: >60
GFR calc Af Amer: >60
GFR calc Af Amer: >60
GFR calc Af Amer: >60
GFR calc Af Amer: >60
GFR calc non Af Amer: >60
GFR calc non Af Amer: >60
GFR calc non Af Amer: >60
GFR calc non Af Amer: >60
GFR calc non Af Amer: >60
GFR calc non Af Amer: >60
GFR calc non Af Amer: >60
GFR calc non Af Amer: >60
GFR calc non Af Amer: >60
GFR calc non Af Amer: >60
GFR calc non Af Amer: >60
GFR calc non Af Amer: >60
GFR calc non Af Amer: >60
Glucose, Bld: 126 — ABNORMAL HIGH
Glucose, Bld: 143 — ABNORMAL HIGH
Glucose, Bld: 161 — ABNORMAL HIGH
Glucose, Bld: 179 — ABNORMAL HIGH
Glucose, Bld: 200 — ABNORMAL HIGH
Glucose, Bld: 250 — ABNORMAL HIGH
Glucose, Bld: 253 — ABNORMAL HIGH
Glucose, Bld: 259 — ABNORMAL HIGH
Glucose, Bld: 360 — ABNORMAL HIGH
Potassium: 3 — ABNORMAL LOW
Potassium: 3.3 — ABNORMAL LOW
Potassium: 3.4 — ABNORMAL LOW
Potassium: 3.4 — ABNORMAL LOW
Potassium: 3.6
Potassium: 3.7
Potassium: 3.7
Potassium: 3.9
Potassium: 4
Potassium: 4.3
Sodium: 136
Sodium: 137
Sodium: 137
Sodium: 138
Sodium: 140
Sodium: 140
Sodium: 141

## 2011-06-29 LAB — URINALYSIS, ROUTINE W REFLEX MICROSCOPIC
Bilirubin Urine: NEGATIVE
Ketones, ur: >80 — AB
Protein, ur: 100 — AB
Specific Gravity, Urine: 1.031 — ABNORMAL HIGH

## 2011-06-29 LAB — HEMOGLOBIN A1C
Hgb A1c MFr Bld: 12.9 — ABNORMAL HIGH
Mean Plasma Glucose: 324

## 2011-06-29 LAB — CBC
HCT: 56.8 — ABNORMAL HIGH
Hemoglobin: 18.4 — ABNORMAL HIGH
MCHC: 32.3
MCV: 89.2
RBC: 6.36 — ABNORMAL HIGH
WBC: 17.4 — ABNORMAL HIGH

## 2011-06-29 LAB — CREATININE, URINE, RANDOM: Creatinine, Urine: 39.4

## 2011-06-29 LAB — POCT CARDIAC MARKERS
CKMB, poc: 3
Myoglobin, poc: 146
Troponin i, poc: <0.05

## 2011-06-29 LAB — PROTEIN, URINE, RANDOM: Total Protein, Urine: 28

## 2011-06-29 LAB — LIPASE, BLOOD: Lipase: 42

## 2011-06-29 LAB — URINE MICROSCOPIC-ADD ON

## 2011-07-01 ENCOUNTER — Encounter (HOSPITAL_COMMUNITY): Payer: Self-pay

## 2011-07-01 LAB — DIFFERENTIAL
Basophils Absolute: 0.1 10*3/uL (ref 0.0–0.1)
Basophils Absolute: 0.1
Basophils Relative: 1 % (ref 0–1)
Eosinophils Absolute: 0.1 10*3/uL (ref 0.0–0.7)
Eosinophils Relative: 1 % (ref 0–5)
Lymphocytes Relative: 37 % (ref 12–46)
Lymphocytes Relative: 9 — ABNORMAL LOW
Monocytes Absolute: 0.6 10*3/uL (ref 0.1–1.0)
Monocytes Absolute: 1.8 — ABNORMAL HIGH
Neutro Abs: 13.8 — ABNORMAL HIGH

## 2011-07-01 LAB — CARDIAC PANEL(CRET KIN+CKTOT+MB+TROPI)
CK, MB: 1.6 ng/mL (ref 0.3–4.0)
CK, MB: 0.9
Relative Index: 1.1 (ref 0.0–2.5)
Relative Index: 1.1 (ref 0.0–2.5)
Total CK: 161 U/L (ref 7–232)
Troponin I: 0.01 ng/mL (ref 0.00–0.06)
Troponin I: 0.01 ng/mL (ref 0.00–0.06)
Troponin I: 0.03

## 2011-07-01 LAB — BASIC METABOLIC PANEL
BUN: 11 mg/dL (ref 6–23)
BUN: 9 mg/dL (ref 6–23)
BUN: 9
CO2: 24 mEq/L (ref 19–32)
CO2: 24 mEq/L (ref 19–32)
Calcium: 9.2 mg/dL (ref 8.4–10.5)
Calcium: 7.5 — ABNORMAL LOW
Calcium: 7.8 — ABNORMAL LOW
Chloride: 108 mEq/L (ref 96–112)
Creatinine, Ser: 0.75 mg/dL (ref 0.4–1.5)
GFR calc Af Amer: >60 mL/min (ref >60)
GFR calc Af Amer: >60
GFR calc non Af Amer: >60 mL/min (ref >60)
GFR calc non Af Amer: >60 mL/min (ref >60)
GFR calc non Af Amer: >60
GFR calc non Af Amer: >60
Glucose, Bld: 107 mg/dL — ABNORMAL HIGH (ref 70–99)
Glucose, Bld: 94 mg/dL (ref 70–99)
Glucose, Bld: 113 — ABNORMAL HIGH
Glucose, Bld: 173 — ABNORMAL HIGH
Potassium: 3.8 mEq/L (ref 3.5–5.1)
Potassium: 4 mEq/L (ref 3.5–5.1)
Potassium: 3.7
Sodium: 140 mEq/L (ref 135–145)
Sodium: 142 mEq/L (ref 135–145)
Sodium: 132 — ABNORMAL LOW

## 2011-07-01 LAB — CK TOTAL AND CKMB (NOT AT ARMC)
CK, MB: 2.7
CK, MB: 3.2
CK, MB: 4.7 — ABNORMAL HIGH
Relative Index: 1.2 (ref 0.0–2.5)
Relative Index: 0.8
Relative Index: 2
Total CK: 132 U/L (ref 7–232)
Total CK: 305 — ABNORMAL HIGH

## 2011-07-01 LAB — URINE CULTURE: Special Requests: NEGATIVE

## 2011-07-01 LAB — HEPATIC FUNCTION PANEL
AST: 33
Albumin: 1.8 — ABNORMAL LOW
Bilirubin, Direct: 0.3
Total Protein: 5.6 — ABNORMAL LOW

## 2011-07-01 LAB — CBC
HCT: 42.3 % (ref 39.0–52.0)
HCT: 38.5 — ABNORMAL LOW
HCT: 42.7
Hemoglobin: 14 g/dL (ref 13.0–17.0)
Hemoglobin: 14 g/dL (ref 13.0–17.0)
Hemoglobin: 12.9 — ABNORMAL LOW
MCHC: 33 g/dL (ref 30.0–36.0)
MCHC: 34.1 g/dL (ref 30.0–36.0)
MCHC: 34.3
MCV: 89.6 fL (ref 78.0–100.0)
MCV: 86.9
Platelets: 112 10*3/uL — ABNORMAL LOW (ref 150–400)
Platelets: 112 10*3/uL — ABNORMAL LOW (ref 150–400)
Platelets: 135 10*3/uL — ABNORMAL LOW (ref 150–400)
Platelets: 136 — ABNORMAL LOW
Platelets: 155
RBC: 4.29
RDW: 18 % — ABNORMAL HIGH (ref 11.5–15.5)
RDW: 18.3 % — ABNORMAL HIGH (ref 11.5–15.5)
RDW: 18.5 % — ABNORMAL HIGH (ref 11.5–15.5)
RDW: 14.7
RDW: 15
RDW: 15.1
WBC: 6.6 10*3/uL (ref 4.0–10.5)
WBC: 10.6 — ABNORMAL HIGH
WBC: 14 — ABNORMAL HIGH
WBC: 17.3 — ABNORMAL HIGH

## 2011-07-01 LAB — BLOOD GAS, ARTERIAL
Acid-Base Excess: 2.6 — ABNORMAL HIGH
Acid-base deficit: 0.6
Bicarbonate: 22.8
Bicarbonate: 26.5 — ABNORMAL HIGH
O2 Saturation: 93.5
Patient temperature: 98.6
TCO2: 27.7
pH, Arterial: 7.442
pO2, Arterial: 66.8 — ABNORMAL LOW

## 2011-07-01 LAB — COMPREHENSIVE METABOLIC PANEL
ALT: 25 U/L (ref 0–53)
AST: 16 U/L (ref 0–37)
Albumin: 2.7 — ABNORMAL LOW
Alkaline Phosphatase: 77 U/L (ref 39–117)
BUN: 21
CO2: 24 mEq/L (ref 19–32)
Calcium: 8.3 — ABNORMAL LOW
Chloride: 108 mEq/L (ref 96–112)
Chloride: 93 — ABNORMAL LOW
Creatinine, Ser: 1.53 — ABNORMAL HIGH
GFR calc Af Amer: >60 mL/min (ref >60)
GFR calc non Af Amer: >60 mL/min (ref >60)
Glucose, Bld: 131 mg/dL — ABNORMAL HIGH (ref 70–99)
Sodium: 140 mEq/L (ref 135–145)
Total Bilirubin: 0.5 mg/dL (ref 0.3–1.2)
Total Bilirubin: 1.7 — ABNORMAL HIGH

## 2011-07-01 LAB — GLUCOSE, CAPILLARY
Glucose-Capillary: 110 mg/dL — ABNORMAL HIGH (ref 70–99)
Glucose-Capillary: 112 mg/dL — ABNORMAL HIGH (ref 70–99)
Glucose-Capillary: 129 mg/dL — ABNORMAL HIGH (ref 70–99)
Glucose-Capillary: 172 mg/dL — ABNORMAL HIGH (ref 70–99)
Glucose-Capillary: 90 mg/dL (ref 70–99)
Glucose-Capillary: 96 mg/dL (ref 70–99)

## 2011-07-01 LAB — TROPONIN I
Troponin I: <0.01 ng/mL (ref 0.00–0.06)
Troponin I: 0.02
Troponin I: 0.02
Troponin I: 0.17 — ABNORMAL HIGH

## 2011-07-01 LAB — CULTURE, BLOOD (ROUTINE X 2): Culture: NO GROWTH

## 2011-07-01 LAB — P CARINII SMEAR DFA: Pneumocystis carinii DFA: NEGATIVE

## 2011-07-01 LAB — B-NATRIURETIC PEPTIDE (CONVERTED LAB): Pro B Natriuretic peptide (BNP): <30 pg/mL (ref 0.0–100.0)

## 2011-07-01 LAB — STREP PNEUMONIAE URINARY ANTIGEN: Strep Pneumo Urinary Antigen: POSITIVE — AB

## 2011-07-01 LAB — URINALYSIS, ROUTINE W REFLEX MICROSCOPIC
Glucose, UA: NEGATIVE
Leukocytes, UA: NEGATIVE
Leukocytes, UA: NEGATIVE
Nitrite: NEGATIVE
Nitrite: NEGATIVE
Protein, ur: 100 — AB
Specific Gravity, Urine: 1.023
Specific Gravity, Urine: 1.037 — ABNORMAL HIGH
Urobilinogen, UA: >8 — ABNORMAL HIGH
pH: 6

## 2011-07-01 LAB — EXPECTORATED SPUTUM ASSESSMENT W GRAM STAIN, RFLX TO RESP C

## 2011-07-01 LAB — POCT CARDIAC MARKERS
CKMB, poc: <1 ng/mL — ABNORMAL LOW (ref 1.0–8.0)
CKMB, poc: <1 ng/mL — ABNORMAL LOW (ref 1.0–8.0)
Myoglobin, poc: 44.2 ng/mL (ref 12–200)
Myoglobin, poc: >500
Operator id: 285841
Troponin i, poc: <0.05 ng/mL (ref 0.00–0.09)
Troponin i, poc: <0.05 ng/mL (ref 0.00–0.09)

## 2011-07-01 LAB — APTT: aPTT: 41 — ABNORMAL HIGH

## 2011-07-01 LAB — URINE MICROSCOPIC-ADD ON

## 2011-07-01 LAB — POCT I-STAT 3, ART BLOOD GAS (G3+)
Bicarbonate: 24.6 — ABNORMAL HIGH
Operator id: 245011
Patient temperature: 37.2
TCO2: 26
pO2, Arterial: 113 — ABNORMAL HIGH

## 2011-07-01 LAB — HEMOGLOBIN A1C
Hgb A1c MFr Bld: 7.1 % — ABNORMAL HIGH (ref 4.6–6.1)
Mean Plasma Glucose: 157 mg/dL

## 2011-07-01 LAB — CULTURE, BAL-QUANTITATIVE W GRAM STAIN
Colony Count: NO GROWTH
Culture: NO GROWTH

## 2011-07-01 LAB — PROTIME-INR
INR: 1.1
Prothrombin Time: 14.3

## 2011-07-01 LAB — LIPID PANEL
Cholesterol: 179 mg/dL (ref 0–200)
LDL Cholesterol: 68 mg/dL (ref 0–99)

## 2011-07-01 LAB — D-DIMER, QUANTITATIVE: D-Dimer, Quant: 3.4 — ABNORMAL HIGH

## 2011-07-01 LAB — PHOSPHORUS: Phosphorus: 3.4

## 2011-07-01 LAB — CULTURE, RESPIRATORY W GRAM STAIN

## 2011-07-01 LAB — LEGIONELLA PROFILE(CULTURE+DFA/SMEAR)

## 2011-07-01 LAB — LEGIONELLA ANTIGEN, URINE

## 2011-07-03 ENCOUNTER — Inpatient Hospital Stay (INDEPENDENT_AMBULATORY_CARE_PROVIDER_SITE_OTHER)
Admission: RE | Admit: 2011-07-03 | Discharge: 2011-07-03 | Disposition: A | Discharge status: Home / Self Care | Payer: Medicare Other | Source: Ambulatory Visit | Attending: Emergency Medicine | Admitting: Emergency Medicine

## 2011-07-03 ENCOUNTER — Emergency Department (HOSPITAL_COMMUNITY): Payer: Medicare Other

## 2011-07-03 ENCOUNTER — Emergency Department (HOSPITAL_COMMUNITY)
Admission: EM | Admit: 2011-07-03 | Discharge: 2011-07-03 | Disposition: A | Discharge status: Home / Self Care | Payer: Medicare Other | Attending: Emergency Medicine | Admitting: Emergency Medicine

## 2011-07-03 DIAGNOSIS — I739 Peripheral vascular disease, unspecified: Secondary | ICD-10-CM | POA: Insufficient documentation

## 2011-07-03 DIAGNOSIS — Z79899 Other long term (current) drug therapy: Secondary | ICD-10-CM | POA: Insufficient documentation

## 2011-07-03 DIAGNOSIS — I509 Heart failure, unspecified: Secondary | ICD-10-CM | POA: Insufficient documentation

## 2011-07-03 DIAGNOSIS — J438 Other emphysema: Secondary | ICD-10-CM | POA: Insufficient documentation

## 2011-07-03 DIAGNOSIS — R142 Eructation: Secondary | ICD-10-CM | POA: Insufficient documentation

## 2011-07-03 DIAGNOSIS — R141 Gas pain: Secondary | ICD-10-CM | POA: Insufficient documentation

## 2011-07-03 DIAGNOSIS — R079 Chest pain, unspecified: Secondary | ICD-10-CM

## 2011-07-03 DIAGNOSIS — I10 Essential (primary) hypertension: Secondary | ICD-10-CM | POA: Insufficient documentation

## 2011-07-03 DIAGNOSIS — R143 Flatulence: Secondary | ICD-10-CM | POA: Insufficient documentation

## 2011-07-03 DIAGNOSIS — E119 Type 2 diabetes mellitus without complications: Secondary | ICD-10-CM | POA: Insufficient documentation

## 2011-07-03 LAB — DIFFERENTIAL
Basophils Absolute: 0 10*3/uL (ref 0.0–0.1)
Lymphocytes Relative: 25 % (ref 12–46)
Monocytes Absolute: 0.8 10*3/uL (ref 0.1–1.0)
Neutro Abs: 6.7 10*3/uL (ref 1.7–7.7)
Neutrophils Relative %: 66 % (ref 43–77)

## 2011-07-03 LAB — CBC
HCT: 46.9 % (ref 39.0–52.0)
Hemoglobin: 16.8 g/dL (ref 13.0–17.0)
MCHC: 35.8 g/dL (ref 30.0–36.0)
RBC: 5.16 MIL/uL (ref 4.22–5.81)
WBC: 10.2 10*3/uL (ref 4.0–10.5)

## 2011-07-03 LAB — BASIC METABOLIC PANEL
BUN: 9 mg/dL (ref 6–23)
CO2: 26 mEq/L (ref 19–32)
Chloride: 103 mEq/L (ref 96–112)
Glucose, Bld: 104 mg/dL — ABNORMAL HIGH (ref 70–99)
Potassium: 3.8 mEq/L (ref 3.5–5.1)

## 2011-07-03 LAB — POCT I-STAT TROPONIN I: Troponin i, poc: 0 ng/mL (ref 0.00–0.08)

## 2011-07-06 LAB — DIFFERENTIAL
Basophils Absolute: 0.2 — ABNORMAL HIGH
Basophils Relative: 2 — ABNORMAL HIGH
Eosinophils Absolute: 0.3
Eosinophils Relative: 3
Lymphocytes Relative: 39
Lymphs Abs: 3.9 — ABNORMAL HIGH
Monocytes Absolute: 1 — ABNORMAL HIGH
Monocytes Relative: 10
Neutro Abs: 4.7
Neutrophils Relative %: 47

## 2011-07-06 LAB — CBC
HCT: 46.4
Hemoglobin: 15.7
MCHC: 33.9
MCV: 91.2
Platelets: 166
RBC: 5.09
RDW: 14.9 — ABNORMAL HIGH
WBC: 10.2

## 2011-07-06 LAB — COMPREHENSIVE METABOLIC PANEL
ALT: 34
Albumin: 3.8
Alkaline Phosphatase: 86
BUN: 5 — ABNORMAL LOW
Chloride: 112
Glucose, Bld: 82
Potassium: 4.3
Sodium: 144
Total Bilirubin: 0.9

## 2011-07-06 LAB — COMPREHENSIVE METABOLIC PANEL WITH GFR
AST: 35
CO2: 22
Calcium: 9.1
Creatinine, Ser: 0.8
GFR calc Af Amer: >60
GFR calc non Af Amer: >60
Total Protein: 6.7

## 2011-07-06 LAB — POCT CARDIAC MARKERS: Myoglobin, poc: 77.9

## 2011-07-06 LAB — ETHANOL: Alcohol, Ethyl (B): 225 — ABNORMAL HIGH

## 2011-07-06 LAB — LIPASE, BLOOD: Lipase: 32

## 2011-07-08 ENCOUNTER — Other Ambulatory Visit: Payer: Medicare Other | Admitting: Internal Medicine

## 2011-07-11 ENCOUNTER — Ambulatory Visit: Payer: Medicare Other | Admitting: Physician Assistant

## 2011-07-21 ENCOUNTER — Ambulatory Visit (INDEPENDENT_AMBULATORY_CARE_PROVIDER_SITE_OTHER): Payer: Medicare Other | Admitting: Internal Medicine

## 2011-07-21 ENCOUNTER — Other Ambulatory Visit (INDEPENDENT_AMBULATORY_CARE_PROVIDER_SITE_OTHER): Payer: Medicare Other

## 2011-07-21 ENCOUNTER — Encounter: Payer: Self-pay | Admitting: Internal Medicine

## 2011-07-21 VITALS — BP 130/78 | HR 80 | Temp 98.8°F | Resp 16 | Wt 212.4 lb

## 2011-07-21 DIAGNOSIS — R079 Chest pain, unspecified: Secondary | ICD-10-CM | POA: Insufficient documentation

## 2011-07-21 DIAGNOSIS — R0789 Other chest pain: Secondary | ICD-10-CM

## 2011-07-21 DIAGNOSIS — A09 Infectious gastroenteritis and colitis, unspecified: Secondary | ICD-10-CM

## 2011-07-21 DIAGNOSIS — R0989 Other specified symptoms and signs involving the circulatory and respiratory systems: Secondary | ICD-10-CM

## 2011-07-21 DIAGNOSIS — E118 Type 2 diabetes mellitus with unspecified complications: Secondary | ICD-10-CM

## 2011-07-21 DIAGNOSIS — I739 Peripheral vascular disease, unspecified: Secondary | ICD-10-CM

## 2011-07-21 DIAGNOSIS — E782 Mixed hyperlipidemia: Secondary | ICD-10-CM

## 2011-07-21 DIAGNOSIS — R0609 Other forms of dyspnea: Secondary | ICD-10-CM

## 2011-07-21 DIAGNOSIS — I10 Essential (primary) hypertension: Secondary | ICD-10-CM

## 2011-07-21 LAB — LIPID PANEL
Cholesterol: 83 mg/dL (ref 0–200)
LDL Cholesterol: 35 mg/dL (ref 0–99)
Total CHOL/HDL Ratio: 2
Triglycerides: 51 mg/dL (ref 0.0–149.0)
VLDL: 10.2 mg/dL (ref 0.0–40.0)

## 2011-07-21 LAB — CBC WITH DIFFERENTIAL/PLATELET
Basophils Absolute: 0 10*3/uL (ref 0.0–0.1)
Eosinophils Absolute: 0.1 10*3/uL (ref 0.0–0.7)
Hemoglobin: 17.2 g/dL — ABNORMAL HIGH (ref 13.0–17.0)
Lymphocytes Relative: 25.9 % (ref 12.0–46.0)
MCHC: 33.6 g/dL (ref 30.0–36.0)
Monocytes Relative: 9.9 % (ref 3.0–12.0)
Neutrophils Relative %: 63 % (ref 43.0–77.0)
Platelets: 133 10*3/uL — ABNORMAL LOW (ref 150.0–400.0)
RDW: 15.2 % — ABNORMAL HIGH (ref 11.5–14.6)

## 2011-07-21 LAB — COMPREHENSIVE METABOLIC PANEL
ALT: 33 U/L (ref 0–53)
AST: 20 U/L (ref 0–37)
Albumin: 3.7 g/dL (ref 3.5–5.2)
CO2: 26 mEq/L (ref 19–32)
Calcium: 8.7 mg/dL (ref 8.4–10.5)
Chloride: 107 mEq/L (ref 96–112)
Creatinine, Ser: 0.9 mg/dL (ref 0.4–1.5)
GFR: 111.33 mL/min (ref >60.00)
Potassium: 4.1 mEq/L (ref 3.5–5.1)
Sodium: 141 mEq/L (ref 135–145)
Total Protein: 6.6 g/dL (ref 6.0–8.3)

## 2011-07-21 LAB — CARDIAC PANEL: Relative Index: 2.2 calc (ref 0.0–2.5)

## 2011-07-21 MED ORDER — METRONIDAZOLE 500 MG PO TABS: 500.0000 mg | ORAL_TABLET | Freq: Three times a day (TID) | ORAL | Status: AC

## 2011-07-21 NOTE — Progress Notes (Signed)
Subjective:    Patient ID: [name], male    DOB: April 26, 1961, 50 y.o.   MRN: [id number]  Diarrhea  This is a new problem. Episode onset: 3 weeks. The problem occurs 5 to 10 times per day. The problem has been gradually improving. The stool consistency is described as watery. Pertinent negatives include no abdominal pain, arthralgias, bloating, chills, coughing, fever, headaches, increased  flatus, myalgias, sweats, URI, vomiting or weight loss. The symptoms are aggravated by nothing. Risk factors include recent antibiotic use. He has tried anti-motility drug for the symptoms. The treatment provided moderate relief.  Chest Pain  This is a new problem. Episode onset: for one week. The onset quality is gradual. The problem occurs intermittently. The problem has been unchanged. The pain is present in the lateral region. The pain is at a severity of 1/10. The pain is mild. The quality of the pain is described as burning. The pain does not radiate. Pertinent negatives include no abdominal pain, back pain, cough, diaphoresis, dizziness, exertional chest pressure, fever, headaches, hemoptysis, irregular heartbeat, leg pain, lower extremity edema, malaise/fatigue, nausea, near-syncope, numbness, orthopnea, palpitations, PND, shortness of breath, sputum production, syncope, vomiting or weakness. The pain is aggravated by nothing. He has tried nothing for the symptoms.  Pertinent negatives for past medical history include no seizures. Prior diagnostic workup includes cardiac catherization.      Review of Systems  Constitutional: Negative for fever, chills, weight loss, malaise/fatigue, diaphoresis, activity change, appetite change, fatigue and unexpected weight change.  HENT: Negative.   Eyes: Negative.   Respiratory: Negative for apnea, cough, hemoptysis, sputum production, choking, chest tightness, shortness of breath, wheezing and stridor.   Cardiovascular: Positive for chest pain. Negative for  palpitations, orthopnea, leg swelling, syncope, PND and near-syncope.  Gastrointestinal: Positive for diarrhea. Negative for nausea, vomiting, abdominal pain, constipation, blood in stool, abdominal distention, anal bleeding, rectal pain, bloating and flatus.  Genitourinary: Negative.   Musculoskeletal: Negative for myalgias, back pain, joint swelling, arthralgias and gait problem.  Skin: Negative for color change, pallor, rash and wound.  Neurological: Negative for dizziness, tremors, seizures, syncope, facial asymmetry, speech difficulty, weakness, light-headedness, numbness and headaches.  Hematological: Negative for adenopathy. Does not bruise/bleed easily.  Psychiatric/Behavioral: Negative.        Objective:   Physical Exam  Vitals reviewed. Constitutional: He is oriented to person, place, and time. He appears well-developed and well-nourished. No distress.  HENT:  Head: Normocephalic and atraumatic.  Mouth/Throat: Oropharynx is clear and moist. No oropharyngeal exudate.  Eyes: Conjunctivae are normal. Right eye exhibits no discharge. Left eye exhibits no discharge. No scleral icterus.  Neck: Normal range of motion. Neck supple. No JVD present. No tracheal deviation present. No thyromegaly present.  Cardiovascular: Normal rate, regular rhythm, normal heart sounds and intact distal pulses.  Exam reveals no gallop and no friction rub.   No murmur heard. Pulmonary/Chest: Effort normal and breath sounds normal. No stridor. No respiratory distress. He has no wheezes. He has no rales. He exhibits no tenderness.  Abdominal: Soft. Bowel sounds are normal. He exhibits no distension and no mass. There is no tenderness. There is no rebound and no guarding.  Musculoskeletal: Normal range of motion. He exhibits no edema and no tenderness.  Lymphadenopathy:    He has no cervical adenopathy.  Neurological: He is oriented to person, place, and time.  Skin: Skin is warm and dry. No rash noted. He is  not diaphoretic. No erythema. No pallor.  Psychiatric: He has  a normal mood and affect. His behavior is normal. Judgment and thought content normal.     Lab Results  Component Value Date   WBC 10.2 07/03/2011   HGB 16.8 07/03/2011   HCT 46.9 07/03/2011   PLT 109* 07/03/2011   GLUCOSE 104* 07/03/2011   CHOL 162 03/15/2011   TRIG 247.0* 03/15/2011   HDL 35.40* 03/15/2011   LDLDIRECT 98.8 03/15/2011   LDLCALC 115* 11/29/2010   ALT 34 03/15/2011   AST 23 03/15/2011   NA 138 07/03/2011   K 3.8 07/03/2011   CL 103 07/03/2011   CREATININE 0.73 07/03/2011   BUN 9 07/03/2011   CO2 26 07/03/2011   TSH 1.16 03/15/2011   PSA 0.58 03/15/2011   INR 1.0 12/19/2008   HGBA1C 6.6* 03/15/2011   MICROALBUR 0.2 10/08/2008       Assessment & Plan:

## 2011-07-21 NOTE — Assessment & Plan Note (Signed)
He is due for his a1c and other labs

## 2011-07-21 NOTE — Patient Instructions (Signed)
Chest Pain (Nonspecific) It is often hard to give a specific diagnosis for the cause of chest pain. There is always a chance that your pain could be related to something serious, such as a heart attack or a blood clot in the lungs. You need to follow up with your caregiver for further evaluation. CAUSES   Heartburn.   Pneumonia or bronchitis.   Anxiety and stress.   Inflammation around your heart (pericarditis) or lung (pleuritis or pleurisy).   A blood clot in the lung.   A collapsed lung (pneumothorax). It can develop suddenly on its own (spontaneous pneumothorax) or from injury (trauma) to the chest.  The chest wall is composed of bones, muscles, and cartilage. Any of these can be the source of the pain.  The bones can be bruised by injury.   The muscles or cartilage can be strained by coughing or overwork.   The cartilage can be affected by inflammation and become sore (costochondritis).  DIAGNOSIS  Lab tests or other studies, such as X-rays, an EKG, stress testing, or cardiac imaging, may be needed to find the cause of your pain.  TREATMENT   Treatment depends on what may be causing your chest pain. Treatment may include:   Acid blockers for heartburn.   Anti-inflammatory medicine.   Pain medicine for inflammatory conditions.   Antibiotics if an infection is present.   You may be advised to change lifestyle habits. This includes stopping smoking and avoiding caffeine and chocolate.   You may be advised to keep your head raised (elevated) when sleeping. This reduces the chance of acid going backward from your stomach into your esophagus.   Most of the time, nonspecific chest pain will improve within 2 to 3 days with rest and mild pain medicine.  HOME CARE INSTRUCTIONS   If antibiotics were prescribed, take the full amount even if you start to feel better.   For the next few days, avoid physical activities that bring on chest pain. Continue physical activities as  directed.   Do not smoke cigarettes or drink alcohol until your symptoms are gone.   Only take over-the-counter or prescription medicine for pain, discomfort, or fever as directed by your caregiver.   Follow your caregiver's suggestions for further testing if your chest pain does not go away.   Keep any follow-up appointments you made. If you do not go to an appointment, you could develop lasting (chronic) problems with pain. If there is any problem keeping an appointment, you must call to reschedule.  SEEK MEDICAL CARE IF:   You think you are having problems from the medicine you are taking. Read your medicine instructions carefully.   Your chest pain does not go away, even after treatment.   You develop a rash with blisters on your chest.  SEEK IMMEDIATE MEDICAL CARE IF:   You have increased chest pain or pain that spreads to your arm, neck, jaw, back, or belly (abdomen).   You develop shortness of breath, an increasing cough, or you are coughing up blood.   You have severe back or abdominal pain, feel sick to your stomach (nauseous) or throw up (vomit).   You develop severe weakness, fainting, or chills.   You have an oral temperature above 102 F (38.9 C), not controlled by medicine.  THIS IS AN EMERGENCY. Do not wait to see if the pain will go away. Get medical help at once. Call your local emergency services (911 in [place] Do not drive yourself to   the hospital. MAKE SURE YOU:   Understand these instructions.   Will watch your condition.   Will get help right away if you are not doing well or get worse.  Document Released: 06/22/2005 Document Revised: 05/25/2011 Document Reviewed: 04/17/2008 [facility] Patient Information 2012 [place], [place].Chronic Diarrhea Diarrhea is loose, watery stools. Having diarrhea means passing loose stools 3 or more times a day. Diarrhea that lasts longer than 4 weeks is considered long-lasting (chronic). Symptoms of chronic diarrhea may be  continual or may come and go. People of all ages can get diarrhea. Body fluid loss (dehydration) may occur as a result of diarrhea. This means the body does not have as many fluids and salts (electrolytes) as it needs. CAUSES  There are many causes of chronic diarrhea. Causes may be different for children and adults. The various causes can be grouped into 2 categories: diarrhea caused by an infection and diarrhea not caused by an infection. Sometimes, the cause is unknown. Diarrhea caused by an infection may result from:  Parasites.   Bacteria.   Viral infections.  Diarrhea not caused by an infection may result from:  Irritable bowel syndrome.   Reaction to medicines, such as antibiotics, cancer drugs, blood pressure medicines, and antacids.   Intestinal disease (Crohn's disease, ulcerative colitis, celiac disease).   Food allergies or sensitivity to additives (fructose, lactose, sugar substitutes).   Tumors.   Diabetes, thyroid disease, and other endocrine diseases.   Reduced blood flow to the intestine.   Previous surgery or radiation of the abdomen or gastrointestinal tract.  Risk factors for chronic diarrhea include:  Having a severely weakened immune system, such as from HIV/AIDS.   Taking certain types of cancer-fighting drugs (chemotherapy) or other medicines.   A recent organ transplant.   Having a portion of the stomach removed.   Traveling to countries where food and water supplies are often contaminated.  SYMPTOMS  In addition to frequent, loose stools, diarrhea may cause:  Cramping.   Abdominal pain.   Nausea.   Urgent need to use the bathroom, or loss of bowel control.  If dehydration occurs, problems include:  Thirst.   Less frequent urination.   Dark urine.   Dry skin.   Fatigue.   Dizziness.  Infections that cause diarrhea may also cause a fever, chills, or bloody stools. DIAGNOSIS  Diagnosis may be difficult. Your caregiver must take a  careful history and perform a physical exam. Tests given are based on your symptoms and history. Tests may include:  Blood or stool tests, in which 3 or more stool samples may be examined. Stool cultures may be used to test for bacteria or parasites.   X-rays.   A procedure in which a thin tube is inserted into the mouth or rectum (endoscopy). This allows the caregiver to look inside the intestine.  TREATMENT   Diarrhea caused by an infection can often be treated with antibiotics.   Diarrhea not caused by an infection is more difficult to diagnose and treat. Long-term medicine use or surgery may be required. Specific treatment should be discussed with your caregiver.   If the cause cannot determined, treatment to relieve symptoms includes:   Preventing dehydration. Serious health problems can occur if you do not maintain proper fluid levels. Many oral rehydration solutions (ORS) are available at drug stores. Ask your caregiver what product is best for you.   Not drinking beverages that contain caffeine (tea, coffee, soft drinks).   Not drinking alcohol. It causes dehydration.  Not relying on sports drinks and broths alone to maintain proper fluid levels. They should not be used to prevent severe dehydration.   Maintaining well-balanced nutrition. This may help you recover faster.  PREVENTION   Drink clean or purified water.   Use proper food handling techniques.   Maintain proper hand-washing habits.  HOME CARE INSTRUCTIONS   Avoid:   Caffeine.   Greasy foods.   High fiber.   If you have problems digesting lactose during or after an episode of diarrhea, you might want to try yogurt. Yogurt is often better tolerated, because it has less lactose than milk. Yogurt with active, live bacterial cultures may even help you recover faster.  SEEK MEDICAL CARE IF:  The person with diarrhea is an otherwise healthy adult and has:  Signs of dehydration.   Diarrhea for more than 2  days.   Severe pain in the abdomen or rectum.   An oral temperature above 102 F (38.9 C).   Stools containing blood or pus.   Stools that are black and tarry.  SEEK IMMEDIATE MEDICAL CARE IF:  The person with diarrhea is a child, elderly person, or has a weakened immune system and has:  Signs of dehydration.   Diarrhea for more than 1 day.   Severe pain in the abdomen or rectum.   An oral temperature above 102 F (38.9 C), not controlled by medicine.   Stools containing blood or pus.   Stools that are black and tarry.  Document Released: 12/03/2003 Document Revised: 05/25/2011 Document Reviewed: 01/29/2010 [facility] Patient Information 2012 [place], [place].

## 2011-07-21 NOTE — Assessment & Plan Note (Signed)
His EKG is normal today and his pain is atypical, it does not sound cardiac in nature to me, I will check for ischemia with enzymes and will look for CHF with a BNP and PE with a D-dimer

## 2011-07-21 NOTE — Assessment & Plan Note (Signed)
He has risk factors for C diff infection so I will empirically ask him to take flagyl but have also asked him to submit stool for testing as well

## 2011-07-21 NOTE — Assessment & Plan Note (Signed)
His BP is well controlled 

## 2011-07-21 NOTE — Assessment & Plan Note (Signed)
I will check his labs today 

## 2011-07-22 ENCOUNTER — Other Ambulatory Visit: Payer: Medicare Other

## 2011-07-22 DIAGNOSIS — A09 Infectious gastroenteritis and colitis, unspecified: Secondary | ICD-10-CM

## 2011-07-22 LAB — D-DIMER, QUANTITATIVE: D-Dimer, Quant: 2 ug{FEU}/mL — ABNORMAL HIGH (ref 0.00–0.48)

## 2011-07-26 LAB — STOOL CULTURE

## 2011-07-26 LAB — CLOSTRIDIUM DIFFICILE EIA: CDIFTX: NEGATIVE

## 2011-07-27 IMAGING — CR DG CHEST 2V
2 series · 2 of 2 positions shown · non-contrast
Comparison: 08/18/2009

CLINICAL DATA: Cough.  Follow-up COPD.  Smoker

CHEST - 2 VIEW

[view not recorded (1 of 2)]
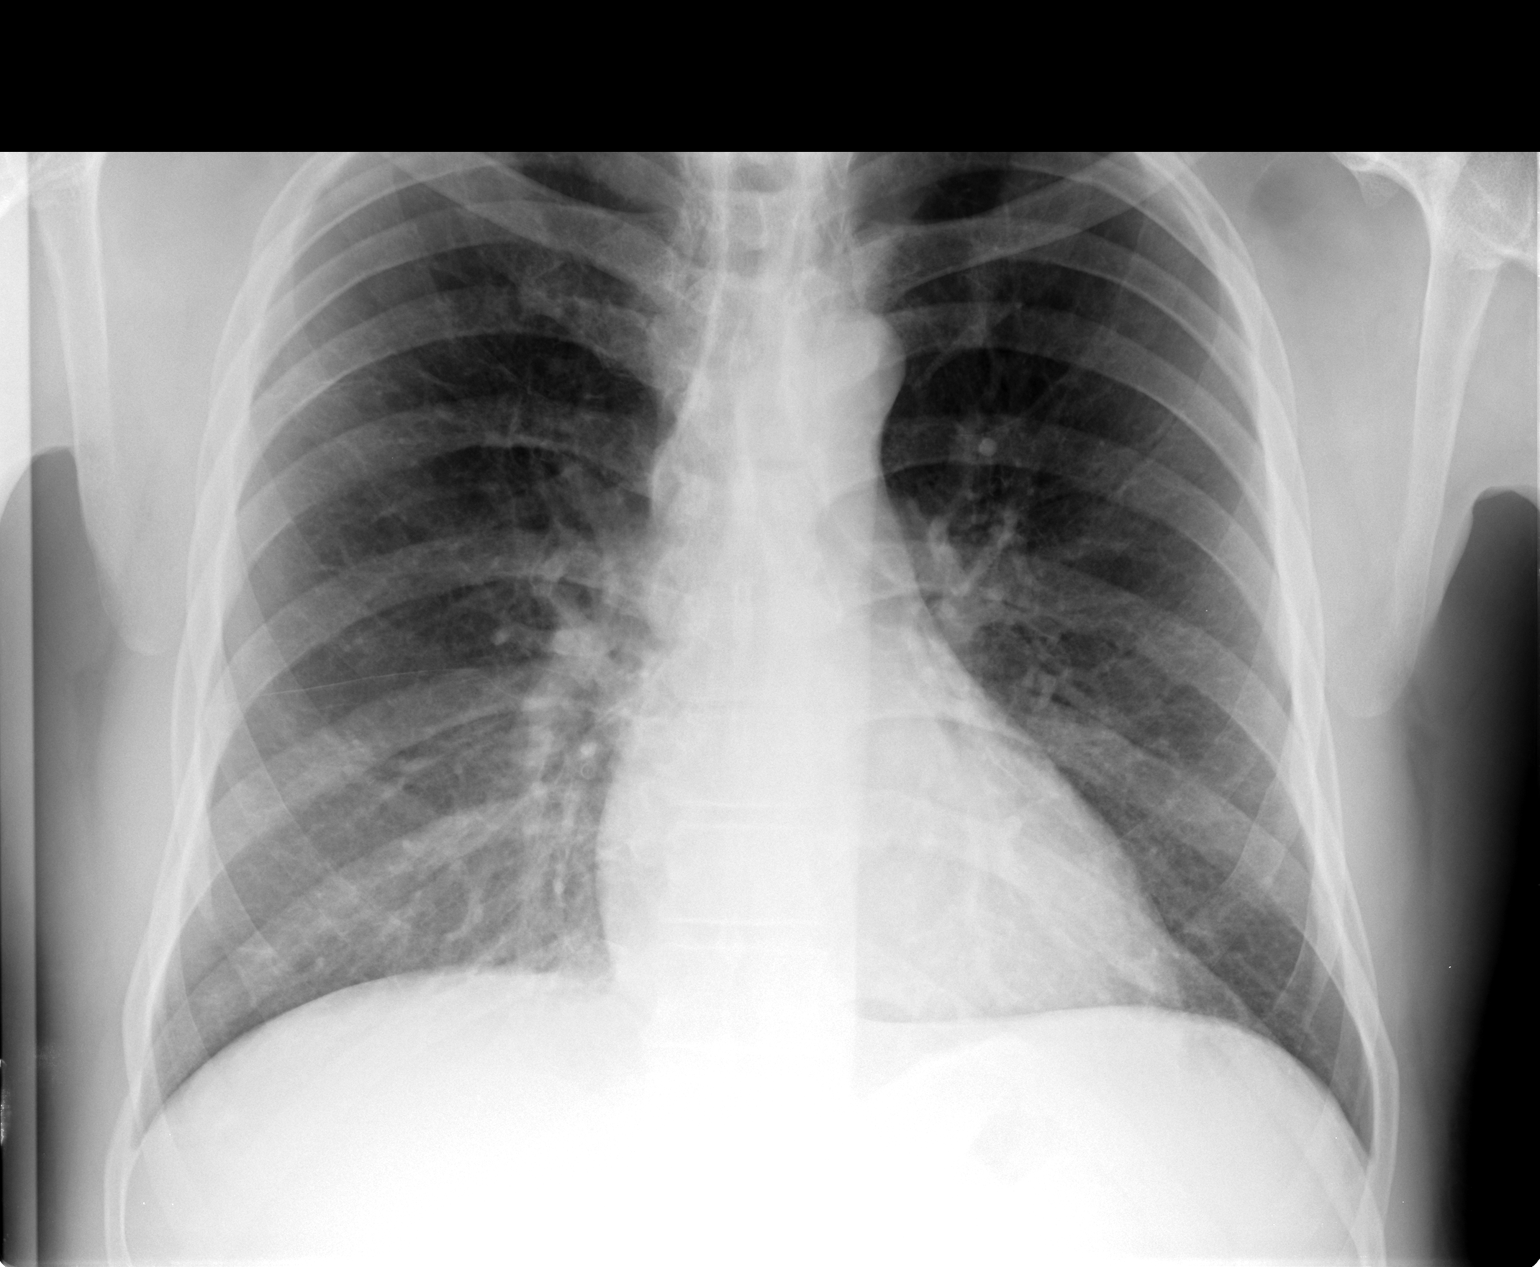

[view not recorded (2 of 2)]
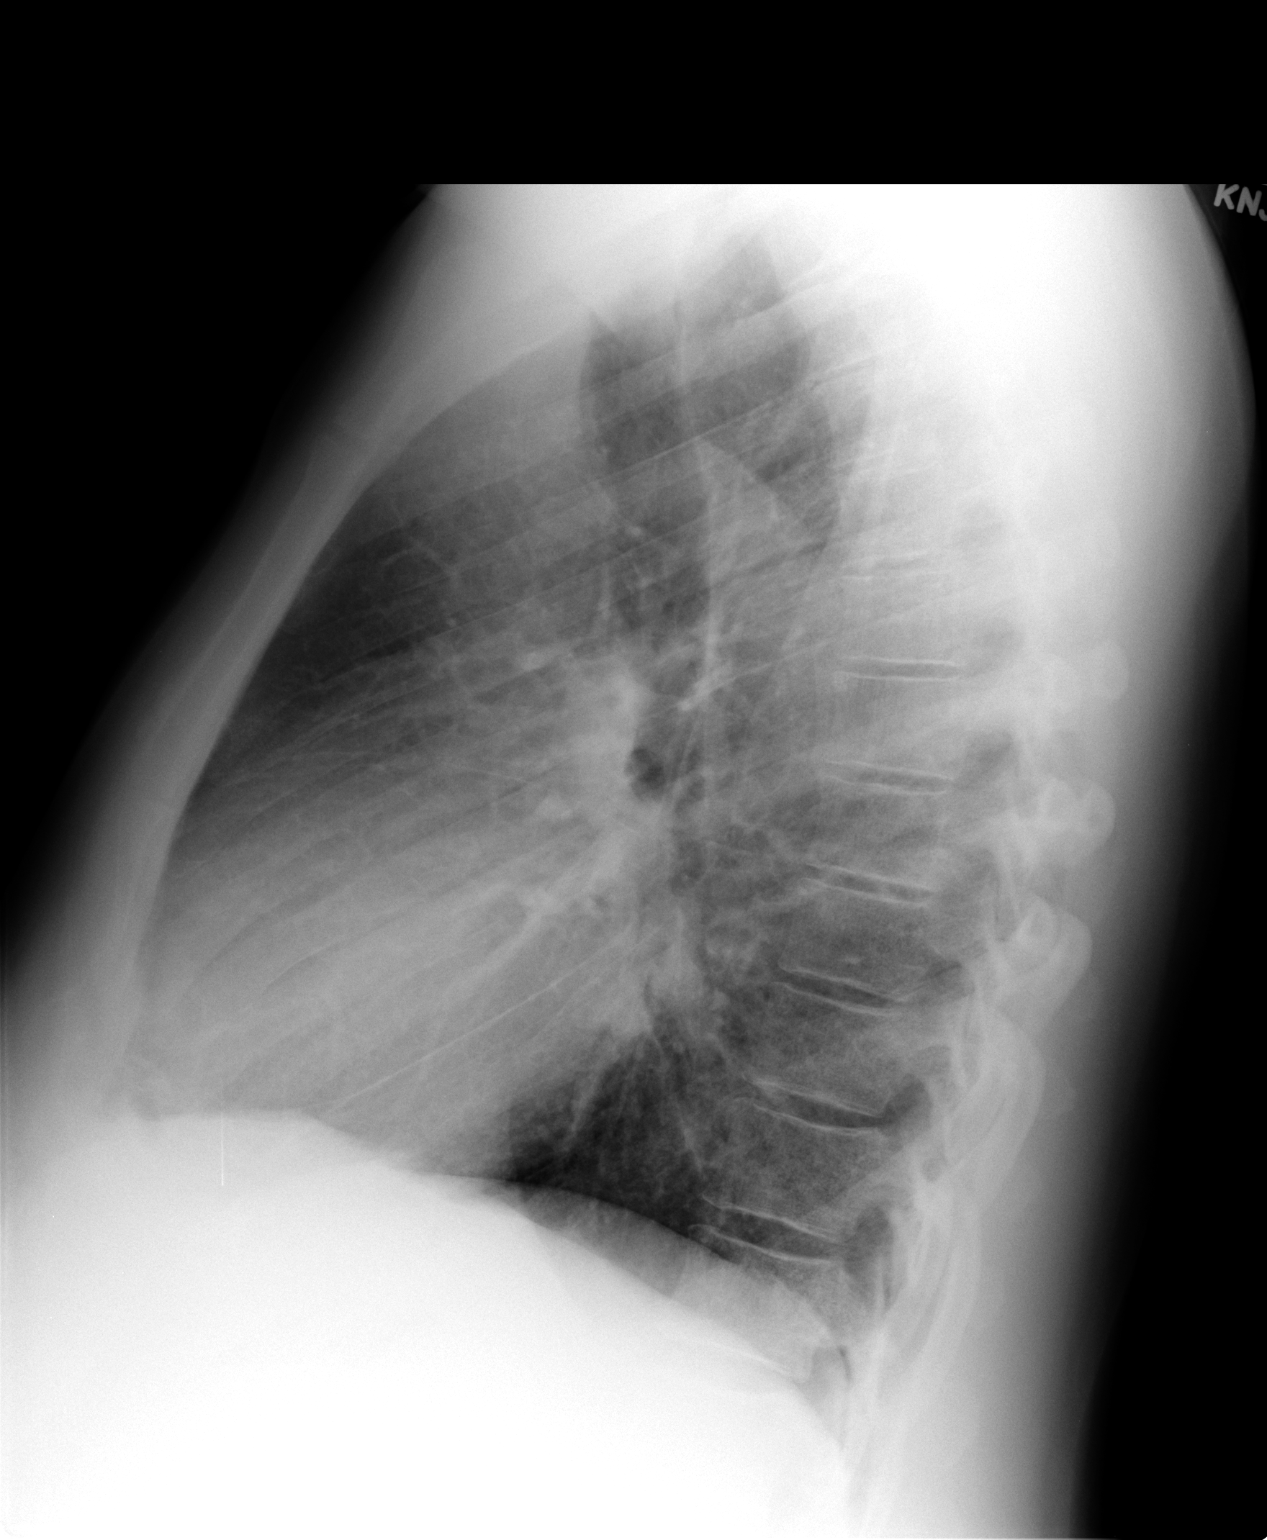

[2 of 2 positions shown; findings below may reference images not displayed]

FINDINGS: Heart and mediastinal contours are within normal limits.
The lung fields demonstrate some increase in peribronchial
thickening centrally with some prominence of the interstitial
markings most notable at the lung bases.  This finding is unchanged
in comparison with the prior exam and would correlate with the
patient's history of smoking.  No new focal infiltrates or signs of
congestive failure are seen.  No pleural fluid is seen.  Mild
bilateral apical pleural thickening is stable.

Bony structures appear intact.
IMPRESSION: Stable cardiopulmonary appearance with no new focal or acute
abnormality noted.

## 2011-08-01 ENCOUNTER — Emergency Department (HOSPITAL_COMMUNITY): Payer: PRIVATE HEALTH INSURANCE

## 2011-08-01 ENCOUNTER — Encounter (HOSPITAL_COMMUNITY): Payer: Self-pay | Admitting: *Deleted

## 2011-08-01 ENCOUNTER — Emergency Department (HOSPITAL_COMMUNITY)
Admission: EM | Admit: 2011-08-01 | Discharge: 2011-08-01 | Disposition: A | Discharge status: Home / Self Care | Payer: PRIVATE HEALTH INSURANCE | Attending: Emergency Medicine | Admitting: Emergency Medicine

## 2011-08-01 ENCOUNTER — Other Ambulatory Visit: Payer: Self-pay

## 2011-08-01 DIAGNOSIS — R0602 Shortness of breath: Secondary | ICD-10-CM | POA: Insufficient documentation

## 2011-08-01 DIAGNOSIS — K219 Gastro-esophageal reflux disease without esophagitis: Secondary | ICD-10-CM | POA: Insufficient documentation

## 2011-08-01 DIAGNOSIS — F200 Paranoid schizophrenia: Secondary | ICD-10-CM | POA: Diagnosis present

## 2011-08-01 DIAGNOSIS — E119 Type 2 diabetes mellitus without complications: Secondary | ICD-10-CM | POA: Insufficient documentation

## 2011-08-01 DIAGNOSIS — J45909 Unspecified asthma, uncomplicated: Secondary | ICD-10-CM | POA: Insufficient documentation

## 2011-08-01 DIAGNOSIS — I739 Peripheral vascular disease, unspecified: Secondary | ICD-10-CM | POA: Insufficient documentation

## 2011-08-01 DIAGNOSIS — R197 Diarrhea, unspecified: Secondary | ICD-10-CM | POA: Insufficient documentation

## 2011-08-01 DIAGNOSIS — I10 Essential (primary) hypertension: Secondary | ICD-10-CM | POA: Insufficient documentation

## 2011-08-01 DIAGNOSIS — R61 Generalized hyperhidrosis: Secondary | ICD-10-CM | POA: Insufficient documentation

## 2011-08-01 DIAGNOSIS — I509 Heart failure, unspecified: Secondary | ICD-10-CM | POA: Insufficient documentation

## 2011-08-01 DIAGNOSIS — R079 Chest pain, unspecified: Secondary | ICD-10-CM | POA: Insufficient documentation

## 2011-08-01 HISTORY — DX: Gastro-esophageal reflux disease without esophagitis: K21.9

## 2011-08-01 LAB — COMPREHENSIVE METABOLIC PANEL
ALT: 95 U/L — ABNORMAL HIGH (ref 0–53)
AST: 38 U/L — ABNORMAL HIGH (ref 0–37)
Albumin: 3.6 g/dL (ref 3.5–5.2)
CO2: 25 mEq/L (ref 19–32)
Calcium: 9.1 mg/dL (ref 8.4–10.5)
Creatinine, Ser: 0.8 mg/dL (ref 0.50–1.35)
Sodium: 140 mEq/L (ref 135–145)

## 2011-08-01 LAB — DIFFERENTIAL
Basophils Absolute: 0 10*3/uL (ref 0.0–0.1)
Basophils Relative: 1 % (ref 0–1)
Eosinophils Relative: 1 % (ref 0–5)
Lymphocytes Relative: 38 % (ref 12–46)
Monocytes Absolute: 0.7 10*3/uL (ref 0.1–1.0)
Neutro Abs: 4.3 10*3/uL (ref 1.7–7.7)

## 2011-08-01 LAB — CBC
HCT: 46.3 % (ref 39.0–52.0)
MCHC: 35.6 g/dL (ref 30.0–36.0)
MCV: 91.1 fL (ref 78.0–100.0)
Platelets: 129 10*3/uL — ABNORMAL LOW (ref 150–400)
RDW: 14.7 % (ref 11.5–15.5)
WBC: 8.3 10*3/uL (ref 4.0–10.5)

## 2011-08-01 LAB — POCT I-STAT TROPONIN I: Troponin i, poc: 0 ng/mL (ref 0.00–0.08)

## 2011-08-01 MED ORDER — NITROGLYCERIN 2 % TD OINT
1.0000 [in_us] | TOPICAL_OINTMENT | Freq: Once | TRANSDERMAL | Status: AC
  Administered 2011-08-01: 1 [in_us] via TOPICAL
  Filled 2011-08-01 (×2): qty 1

## 2011-08-01 MED ORDER — NITROGLYCERIN 0.4 MG SL SUBL: 0.4000 mg | SUBLINGUAL_TABLET | SUBLINGUAL | Status: DC | PRN

## 2011-08-01 NOTE — ED Provider Notes (Signed)
History     CSN: [id number] Arrival date & time: 08/01/2011  4:12 PM   First MD Initiated Contact with Patient 08/01/11 1619      Chief Complaint  Patient presents with  . Chest Pain  . Shortness of Breath    (Consider location/radiation/quality/duration/timing/severity/associated sxs/prior treatment) HPI  Patient relates yesterday he started getting shortness of breath and chest pressure and tightness in the center and right side of his chest that got worse today. It does not radiate. He denies nausea vomiting wheezing abdominal swelling or swelling in his legs. He states today he did get diaphoretic and felt like he was choking. He relates the EMS gave him 4 baby aspirin and a nitroglycerin and his pain greatly improved. He states his pain was a 4 at its worst today and now is a 1. He also relates he's had diarrhea and finished a course of Flagyl yesterday. He states he's now only having 3 loose and watery stools a day which is much improved. He was seen on October 25 by Dr. Yetta Barre for his diarrhea and has an appointment to re\re see him on November 15. He relates he is shortness of breath is now gone. Nothing made it worse at home, nothing made it feel better at home,. He states he had used his nebulizer at home without improvement.  Pt relates he had a cardiac cath in 2008 by Dr Shon Baton which was normal.  Primary Care Dr Yetta Barre of [facility] Cardiologist Dr Sanjuana Kava  Past Medical History  Diagnosis Date  . PVD (peripheral vascular disease)   . Cough   . Rhinitis   . Chronic airway obstruction, not elsewhere classified   . Family history of colonic polyps   . Family history of malignant neoplasm of gastrointestinal tract   . Personal history of colonic polyps   . Dysphagia, unspecified   . Thrombocytopenia, unspecified   . 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 Tobacco use disorder   . Lumbago   . Type II or unspecified  type diabetes mellitus with unspecified complication, not stated as uncontrolled   . Hypertension     MIXED  . Hypertrophy of prostate with urinary obstruction and other lower urinary tract symptoms (LUTS)   . Asthma   . 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 Tracheostomy   . Carpal tunnel release     RIGHT WRIST  . Iliac artery stent 07/2009    STENT LEFT COMMON ILIAC ARTERY. (DR. BERRY)  . Podiatric 2011    LEFT FOOT SURGERY  . Coronary stent placement   Pt denies Cardiac Stent   Family History  Problem Relation Age of Onset  . Colon cancer Mother   . Stroke Mother   . Cancer Mother     colon  . Diabetes      3/6 siblings  . Alcohol abuse    . Arthritis    . Hypertension    . Hyperlipidemia    . Heart disease Father   . Heart attack Father   . Colon polyps Sister     History  Substance Use Topics  . Smoking status: Current Everyday Smoker -- 2.0 packs/day for 38 years    Types: Cigarettes  . Smokeless tobacco: Not on file  . Alcohol Use: Heavy 12  ppd of beer  Disability for paranoid schiozophrenia Pt not interested in detox at this time, states he has a therapist and a psychiatrist    Review of Systems  All other systems reviewed and are negative.    Allergies  Ace inhibitors and Clopidogrel bisulfate  Home Medications   Current Outpatient Rx  Name Route Sig Dispense Refill  . ALBUTEROL SULFATE HFA 108 (90 BASE) MCG/ACT IN AERS Inhalation Inhale 2 puffs into the lungs every 6 (six) hours as needed. 3 Inhaler 1  . ALPRAZOLAM 1 MG PO TABS Oral Take 1 tablet (1 mg total) by mouth 3 (three) times daily as needed. 90 tablet 5  . AZELASTINE HCL 137 MCG/SPRAY NA SOLN Nasal Place 1 spray into the nose 2 (two) times daily. Use in each nostril as directed 90 mL 2  . CARVEDILOL 12.5 MG PO TABS Oral Take 1  tablet (12.5 mg total) by mouth 2 (two) times daily with a meal. 180 tablet 3  . CELECOXIB 200 MG PO CAPS Oral Take 1 capsule (200 mg total) by mouth daily. 30 capsule 5  . ESOMEPRAZOLE MAGNESIUM 40 MG PO CPDR Oral Take 40 mg by mouth daily before breakfast.      . HYDROCODONE-ACETAMINOPHEN 10-660 MG PO TABS Oral Take 1 tablet by mouth 4 (four) times daily as needed. 120 each 5  . PAROXETINE HCL 30 MG PO TABS Oral Take 1 tablet (30 mg total) by mouth every morning. 90 tablet 3  . ROSUVASTATIN CALCIUM 20 MG PO TABS Oral Take 20 mg by mouth daily.      Marland Kitchen TIOTROPIUM BROMIDE MONOHYDRATE 18 MCG IN CAPS Inhalation Place 1 capsule (18 mcg total) into inhaler and inhale daily. 90 capsule 3  . TRAZODONE HCL 100 MG PO TABS Oral Take 100 mg by mouth at bedtime. 3 TABS QHS     . ALBUTEROL SULFATE (2.5 MG/3ML) 0.083% IN NEBU Nebulization Take 3 mLs (2.5 mg total) by nebulization every 4 (four) hours as needed. 260 mL 3  . ELEMENT CONTROL NORMAL VI LIQD  as directed.      Marland Kitchen ELEMENT AUTOCODE SYSTEM W/DEVICE KIT       . GLUCOSE BLOOD VI STRP Other 1 each by Other route as needed. Use as instructed     . METRONIDAZOLE 500 MG PO TABS Oral Take 1 tablet (500 mg total) by mouth 3 (three) times daily. 30 tablet 0    BP 135/81  Pulse 88  Temp(Src) 97.7 F (36.5 C) (Oral)  Resp 24  SpO2 97%  Vital signs are normal   Physical Exam  Vitals reviewed. Constitutional: He is oriented to person, place, and time. He appears well-developed and well-nourished.  HENT:  Head: Normocephalic and atraumatic.       Edentulous  Eyes: Conjunctivae and EOM are normal. Pupils are equal, round, and reactive to light.  Neck: Normal range of motion. Neck supple.  Cardiovascular: Normal rate, regular rhythm and normal heart sounds.   Pulmonary/Chest:       Patient has marked decreased breath sounds diffusely there is no wheezes rhonchi or rales there is no retractions  Abdominal: Soft. Bowel sounds are normal. He exhibits  distension. There is no tenderness. There is no rebound and no guarding.  Musculoskeletal: Normal range of motion.       No edema seen  Neurological: He is alert and oriented to person, place, and time.  Skin: Skin is warm and dry. No rash noted. No erythema. No pallor.  Psychiatric: He has a normal mood and affect. His behavior is normal. Thought content normal.    ED Course  Procedures (including critical care time)  Results for orders placed during the hospital encounter of 08/01/11  CBC      Component Value Range   WBC 8.3  4.0 - 10.5 (K/uL)   RBC 5.08  4.22 - 5.81 (MIL/uL)   Hemoglobin 16.5  13.0 - 17.0 (g/dL)   HCT 16.1  09.6 - 04.5 (%)   MCV 91.1  78.0 - 100.0 (fL)   MCH 32.5  26.0 - 34.0 (pg)   MCHC 35.6  30.0 - 36.0 (g/dL)   RDW 40.9  81.1 - 91.4 (%)   Platelets 129 (*) 150 - 400 (K/uL)  DIFFERENTIAL      Component Value Range   Neutrophils Relative 52  43 - 77 (%)   Neutro Abs 4.3  1.7 - 7.7 (K/uL)   Lymphocytes Relative 38  12 - 46 (%)   Lymphs Abs 3.2  0.7 - 4.0 (K/uL)   Monocytes Relative 9  3 - 12 (%)   Monocytes Absolute 0.7  0.1 - 1.0 (K/uL)   Eosinophils Relative 1  0 - 5 (%)   Eosinophils Absolute 0.1  0.0 - 0.7 (K/uL)   Basophils Relative 1  0 - 1 (%)   Basophils Absolute 0.0  0.0 - 0.1 (K/uL)  COMPREHENSIVE METABOLIC PANEL      Component Value Range   Sodium 140  135 - 145 (mEq/L)   Potassium 4.0  3.5 - 5.1 (mEq/L)   Chloride 105  96 - 112 (mEq/L)   CO2 25  19 - 32 (mEq/L)   Glucose, Bld 95  70 - 99 (mg/dL)   BUN 9  6 - 23 (mg/dL)   Creatinine, Ser 7.82  0.50 - 1.35 (mg/dL)   Calcium 9.1  8.4 - 95.6 (mg/dL)   Total Protein 6.4  6.0 - 8.3 (g/dL)   Albumin 3.6  3.5 - 5.2 (g/dL)   AST 38 (*) 0 - 37 (U/L)   ALT 95 (*) 0 - 53 (U/L)   Alkaline Phosphatase 70  39 - 117 (U/L)   Total Bilirubin 0.5  0.3 - 1.2 (mg/dL)   GFR calc non Af Amer >90  >90 (mL/min)   GFR calc Af Amer >90  >90 (mL/min)  PRO B NATRIURETIC PEPTIDE      Component Value Range    BNP, POC 18.2  0 - 125 (pg/mL)  POCT I-STAT TROPONIN I      Component Value Range   Troponin i, poc 0.00  0.00 - 0.08 (ng/mL)   Comment 3           Dg Chest 2 View  Laboratory interpretation mild thrombocytopenia otherwise normal   Dg Chest Portable 1 View  08/01/2011  *RADIOLOGY REPORT*  Clinical Data: Chest pain, hypertension, asthma, smoker cardiomyopathy, COPD  PORTABLE CHEST - 1 VIEW  Comparison: Portable exam 1710 hours compared to 07/03/2011  Findings: Normal heart size, mediastinal contours, and pulmonary vascularity. Lungs clear. No pleural effusion or pneumothorax. Bones unremarkable.  IMPRESSION: No acute abnormalities.  Original Report Authenticated By: Lollie Marrow, M.D.       Date: 08/01/2011  Rate: 84  Rhythm: normal sinus rhythm  QRS Axis: left  Intervals: normal  ST/T Wave abnormalities: normal  Conduction Disutrbances:none  Narrative Interpretation: LAE  Old EKG Reviewed: unchanged from 07/03/2011   Hospital course patient remains pain-free he ate with no difficulty. Reviewing  his problem list he has a history of atypical chest pain in the past. Since nitroglycerin helped his pain he will be prescribed nitroglycerin to take at home. He is advised to followup with Dr. Clifton James soon. Patient had normal cardiac catheter in 2008 so suspicion for coronary artery disease is low   Diagnoses that have been ruled out:  Diagnoses that are still under consideration:  Final diagnoses:  Chest pain    Medications  nitroGLYCERIN (NITROSTAT) 0.4 MG SL tablet (not administered)  nitroGLYCERIN (NITROGLYN) 2 % ointment 1 inch (1 inch Topical Given 08/01/11 1703)   Devoria Albe, MD, FACEP    MDM          Ward Givens, MD 08/01/11 2220

## 2011-08-01 NOTE — ED Notes (Signed)
Patient is resting comfortably. Introduced myself to patient. Patient given some Ice chips. toileting offered and accepted. Patient denies pain

## 2011-08-01 NOTE — ED Notes (Signed)
Pt c/o R sided CP & SOB onset @ 12:30 today, CP radiates to L ABD, pt denies N/V, pt reports Diarrhea x 1wk, pt reports taking an antibiotic & states that he has 2 more days of antibiotics left but unable to identify the name of the antibiotic, pt currently has a HA post 324 Aspirin & 1 SL Nitro in route via [facility], pt rated pain 4/10 until meds were admin in route & now rates pain 1/10 described as chest tightness that is constant, pt A&O x4, no respiratory distress, NAD

## 2011-08-01 NOTE — ED Notes (Addendum)
Patient states that chest tightness has returned.  Tightness is mid chest and is non-radiating

## 2011-08-01 NOTE — ED Notes (Signed)
Ekg done and old ekg found in [facility] and given to Dr. Wentz. 

## 2011-08-01 NOTE — ED Notes (Signed)
Pt aggravated  Stating he has been calling for his rn.  Pt states he is having increased chest pain and shortness of breath.  MD made aware of pt status and MD states she will get around to seeing pt.

## 2011-08-02 ENCOUNTER — Ambulatory Visit (INDEPENDENT_AMBULATORY_CARE_PROVIDER_SITE_OTHER)
Admission: RE | Admit: 2011-08-02 | Discharge: 2011-08-02 | Disposition: A | Discharge status: Home / Self Care | Payer: PRIVATE HEALTH INSURANCE | Source: Ambulatory Visit | Attending: Internal Medicine | Admitting: Internal Medicine

## 2011-08-02 ENCOUNTER — Other Ambulatory Visit: Payer: Self-pay | Admitting: Internal Medicine

## 2011-08-02 ENCOUNTER — Telehealth: Payer: Self-pay

## 2011-08-02 DIAGNOSIS — R0789 Other chest pain: Secondary | ICD-10-CM

## 2011-08-02 DIAGNOSIS — R791 Abnormal coagulation profile: Secondary | ICD-10-CM

## 2011-08-02 DIAGNOSIS — R7989 Other specified abnormal findings of blood chemistry: Secondary | ICD-10-CM

## 2011-08-02 MED ORDER — IOHEXOL 300 MG/ML  SOLN
80.0000 mL | Freq: Once | INTRAMUSCULAR | Status: AC | PRN
  Administered 2011-08-02: 80 mL via INTRAVENOUS

## 2011-08-02 NOTE — Telephone Encounter (Signed)
I called him and told him that I have ordered a CT scan of the chest

## 2011-08-02 NOTE — Telephone Encounter (Signed)
Patient called stating that he was seen in ER 08/01/11 for chest pain. He c/o headache, blood in stool and afraid he may have a clot. ER records pulled for MD advisement

## 2011-08-04 ENCOUNTER — Ambulatory Visit (INDEPENDENT_AMBULATORY_CARE_PROVIDER_SITE_OTHER): Payer: 59 | Admitting: Internal Medicine

## 2011-08-04 ENCOUNTER — Encounter: Payer: Self-pay | Admitting: Internal Medicine

## 2011-08-04 DIAGNOSIS — B181 Chronic viral hepatitis B without delta-agent: Secondary | ICD-10-CM

## 2011-08-04 DIAGNOSIS — D696 Thrombocytopenia, unspecified: Secondary | ICD-10-CM

## 2011-08-04 DIAGNOSIS — I429 Cardiomyopathy, unspecified: Secondary | ICD-10-CM

## 2011-08-04 DIAGNOSIS — F341 Dysthymic disorder: Secondary | ICD-10-CM

## 2011-08-04 DIAGNOSIS — F418 Other specified anxiety disorders: Secondary | ICD-10-CM

## 2011-08-04 MED ORDER — KETOCONAZOLE 2 % EX SHAM: MEDICATED_SHAMPOO | CUTANEOUS | Status: AC

## 2011-08-04 NOTE — Assessment & Plan Note (Signed)
His recent LFT's were slightly elevated due to the EtOH abuse, it does not look like he has a recurrence of Hep B

## 2011-08-04 NOTE — Assessment & Plan Note (Signed)
He has stopped drinking and will restart Paxil

## 2011-08-04 NOTE — Progress Notes (Signed)
Subjective:    Patient ID: [name], male    DOB: Apr 14, 1961, 50 y.o.   MRN: [id number]  HPI He returns for f/up after being seen in the ER for CP and rectal bleeding, these symptoms started after he had a relapse on EtOH after nearly 18 years of being clean and sober. He had become depressed and lonely so he was consuming a 12 pack of beer each day + 2 bottles of wine daily for a few weeks. His last drink was one week ago and since then the CP and bleeding have resolved. He had a slightly elevated d-dimer but a CT of his chest was negative for PE.   Review of Systems  Constitutional: Positive for unexpected weight change (weight gain). Negative for fever, chills, diaphoresis, activity change, appetite change and fatigue.  HENT: Negative.   Eyes: Negative.   Respiratory: Negative for apnea, cough, choking, chest tightness, shortness of breath, wheezing and stridor.   Cardiovascular: Negative for chest pain, palpitations and leg swelling.  Gastrointestinal: Negative for nausea, vomiting, abdominal pain, diarrhea, constipation, blood in stool and abdominal distention.  Genitourinary: Negative for dysuria, urgency, frequency, hematuria, flank pain, decreased urine volume, enuresis and difficulty urinating.  Musculoskeletal: Negative for myalgias, back pain, joint swelling, arthralgias and gait problem.  Skin: Negative.   Neurological: Negative for dizziness, tremors, seizures, syncope, facial asymmetry, speech difficulty, weakness, light-headedness, numbness and headaches.  Hematological: Negative for adenopathy. Does not bruise/bleed easily.  Psychiatric/Behavioral: Negative.        Objective:   Physical Exam  Vitals reviewed. Constitutional: He is oriented to person, place, and time. He appears well-developed and well-nourished. No distress.  HENT:  Head: Normocephalic and atraumatic.  Mouth/Throat: Oropharynx is clear and moist. No oropharyngeal exudate.  Eyes: Conjunctivae are  normal. Right eye exhibits no discharge. Left eye exhibits no discharge. No scleral icterus.  Neck: Normal range of motion. Neck supple. No JVD present. No tracheal deviation present. No thyromegaly present.  Cardiovascular: Normal rate, regular rhythm, normal heart sounds and intact distal pulses.  Exam reveals no gallop and no friction rub.   No murmur heard. Pulmonary/Chest: Effort normal and breath sounds normal. No stridor. No respiratory distress. He has no wheezes. He has no rales. He exhibits no tenderness.  Abdominal: Soft. Bowel sounds are normal. He exhibits no distension and no mass. There is no tenderness. There is no rebound and no guarding. Hernia confirmed negative in the right inguinal area and confirmed negative in the left inguinal area.  Genitourinary: Prostate normal, testes normal and penis normal. Rectal exam shows anal tone abnormal. Rectal exam shows no external hemorrhoid, no internal hemorrhoid, no fissure, no mass and no tenderness. Guaiac negative stool. Prostate is not enlarged and not tender. Right testis shows no mass, no swelling and no tenderness. Right testis is descended. Left testis shows no mass, no swelling and no tenderness. Left testis is descended. Uncircumcised. No phimosis, paraphimosis, hypospadias, penile erythema or penile tenderness. No discharge found.  Musculoskeletal: Normal range of motion. He exhibits no edema and no tenderness.  Lymphadenopathy:    He has no cervical adenopathy.       Right: No inguinal adenopathy present.       Left: No inguinal adenopathy present.  Neurological: He is oriented to person, place, and time. He displays normal reflexes. He exhibits normal muscle tone. Coordination normal.  Skin: Skin is warm and dry. No rash noted. He is not diaphoretic. No erythema. No pallor.  Psychiatric: He  has a normal mood and affect. His behavior is normal. Judgment and thought content normal.      Lab Results  Component Value Date   WBC  8.3 08/01/2011   HGB 16.5 08/01/2011   HCT 46.3 08/01/2011   PLT 129* 08/01/2011   GLUCOSE 95 08/01/2011   CHOL 83 07/21/2011   TRIG 51.0 07/21/2011   HDL 37.60* 07/21/2011   LDLDIRECT 98.8 03/15/2011   LDLCALC 35 07/21/2011   ALT 95* 08/01/2011   AST 38* 08/01/2011   NA 140 08/01/2011   K 4.0 08/01/2011   CL 105 08/01/2011   CREATININE 0.80 08/01/2011   BUN 9 08/01/2011   CO2 25 08/01/2011   TSH 1.16 03/15/2011   PSA 0.58 03/15/2011   INR 1.0 12/19/2008   HGBA1C 6.1 07/21/2011   MICROALBUR 0.2 10/08/2008      Assessment & Plan:

## 2011-08-04 NOTE — Assessment & Plan Note (Signed)
It appears that this is stable

## 2011-08-04 NOTE — Assessment & Plan Note (Signed)
This is stable 

## 2011-08-05 ENCOUNTER — Telehealth: Payer: Self-pay

## 2011-08-05 MED ORDER — TRAZODONE HCL 100 MG PO TABS: ORAL_TABLET | ORAL | Status: DC

## 2011-08-05 NOTE — Telephone Encounter (Signed)
Refill

## 2011-08-11 ENCOUNTER — Ambulatory Visit: Payer: Medicare Other | Admitting: Internal Medicine

## 2011-08-29 ENCOUNTER — Other Ambulatory Visit: Payer: Self-pay | Admitting: Internal Medicine

## 2011-09-29 ENCOUNTER — Encounter: Payer: Self-pay | Admitting: Internal Medicine

## 2011-09-29 ENCOUNTER — Ambulatory Visit (INDEPENDENT_AMBULATORY_CARE_PROVIDER_SITE_OTHER): Payer: Medicare Other | Admitting: Internal Medicine

## 2011-09-29 ENCOUNTER — Other Ambulatory Visit (INDEPENDENT_AMBULATORY_CARE_PROVIDER_SITE_OTHER): Payer: Medicare Other

## 2011-09-29 ENCOUNTER — Other Ambulatory Visit: Payer: Self-pay | Admitting: Internal Medicine

## 2011-09-29 VITALS — BP 124/72 | HR 73 | Temp 97.1°F | Resp 16 | Wt 214.0 lb

## 2011-09-29 DIAGNOSIS — Z23 Encounter for immunization: Secondary | ICD-10-CM

## 2011-09-29 DIAGNOSIS — B181 Chronic viral hepatitis B without delta-agent: Secondary | ICD-10-CM

## 2011-09-29 DIAGNOSIS — E782 Mixed hyperlipidemia: Secondary | ICD-10-CM

## 2011-09-29 DIAGNOSIS — I10 Essential (primary) hypertension: Secondary | ICD-10-CM

## 2011-09-29 DIAGNOSIS — M179 Osteoarthritis of knee, unspecified: Secondary | ICD-10-CM

## 2011-09-29 DIAGNOSIS — D696 Thrombocytopenia, unspecified: Secondary | ICD-10-CM

## 2011-09-29 DIAGNOSIS — R509 Fever, unspecified: Secondary | ICD-10-CM

## 2011-09-29 DIAGNOSIS — E118 Type 2 diabetes mellitus with unspecified complications: Secondary | ICD-10-CM

## 2011-09-29 DIAGNOSIS — J449 Chronic obstructive pulmonary disease, unspecified: Secondary | ICD-10-CM

## 2011-09-29 DIAGNOSIS — IMO0002 Reserved for concepts with insufficient information to code with codable children: Secondary | ICD-10-CM

## 2011-09-29 DIAGNOSIS — M1710 Unilateral primary osteoarthritis, unspecified knee: Secondary | ICD-10-CM

## 2011-09-29 LAB — URINALYSIS, ROUTINE W REFLEX MICROSCOPIC
Ketones, ur: NEGATIVE
Leukocytes, UA: NEGATIVE
Nitrite: NEGATIVE
Specific Gravity, Urine: 1.025 (ref 1.000–1.030)
Urobilinogen, UA: 0.2 (ref 0.0–1.0)
pH: 6 (ref 5.0–8.0)

## 2011-09-29 LAB — CBC WITH DIFFERENTIAL/PLATELET
Basophils Relative: 0.5 % (ref 0.0–3.0)
Eosinophils Relative: 1.1 % (ref 0.0–5.0)
Hemoglobin: 16.8 g/dL (ref 13.0–17.0)
MCV: 95.3 fl (ref 78.0–100.0)
Monocytes Absolute: 0.7 10*3/uL (ref 0.1–1.0)
Neutro Abs: 6.2 10*3/uL (ref 1.4–7.7)
Neutrophils Relative %: 62 % (ref 43.0–77.0)
RBC: 5.16 Mil/uL (ref 4.22–5.81)
WBC: 10 10*3/uL (ref 4.5–10.5)

## 2011-09-29 LAB — COMPREHENSIVE METABOLIC PANEL
Albumin: 3.5 g/dL (ref 3.5–5.2)
BUN: 8 mg/dL (ref 6–23)
CO2: 29 mEq/L (ref 19–32)
GFR: 117.11 mL/min (ref >60.00)
Glucose, Bld: 101 mg/dL — ABNORMAL HIGH (ref 70–99)
Potassium: 4.5 mEq/L (ref 3.5–5.1)
Sodium: 143 mEq/L (ref 135–145)
Total Bilirubin: 0.5 mg/dL (ref 0.3–1.2)
Total Protein: 5.8 g/dL — ABNORMAL LOW (ref 6.0–8.3)

## 2011-09-29 LAB — HIV ANTIBODY (ROUTINE TESTING W REFLEX): HIV: NONREACTIVE

## 2011-09-29 LAB — CK: Total CK: 155 U/L (ref 7–232)

## 2011-09-29 LAB — LIPID PANEL: HDL: 31.2 mg/dL — ABNORMAL LOW (ref >39.00)

## 2011-09-29 NOTE — Assessment & Plan Note (Signed)
I will recheck his CBC today 

## 2011-09-29 NOTE — Progress Notes (Signed)
Subjective:    Patient ID: [name], male    DOB: [birth year], 51 y.o.   MRN: [id number]  Hyperlipidemia This is a chronic problem. The current episode started more than 1 year ago. The problem is controlled. Recent lipid tests were reviewed and are variable. Exacerbating diseases include diabetes. He has no history of chronic renal disease, hypothyroidism, liver disease, obesity or nephrotic syndrome. Factors aggravating his hyperlipidemia include no known factors. Associated symptoms include leg pain and myalgias. Pertinent negatives include no chest pain, focal sensory loss, focal weakness or shortness of breath. Current antihyperlipidemic treatment includes statins. The current treatment provides moderate improvement of lipids. Compliance problems include adherence to exercise, adherence to diet and medication side effects.   Diabetes He presents for his follow-up diabetic visit. He has type 2 diabetes mellitus. His disease course has been improving. There are no hypoglycemic associated symptoms. Pertinent negatives for hypoglycemia include no dizziness, headaches, pallor, seizures, speech difficulty or tremors. Pertinent negatives for diabetes include no blurred vision, no chest pain, no fatigue, no foot paresthesias, no foot ulcerations, no polydipsia, no polyphagia, no polyuria, no visual change, no weakness and no weight loss. There are no hypoglycemic complications. Symptoms are stable. There are no diabetic complications. His weight is stable. He is following a generally healthy diet. Meal planning includes avoidance of concentrated sweets. He has not had a previous visit with a dietician. He participates in exercise intermittently. There is no change in his home blood glucose trend. An ACE inhibitor/angiotensin II receptor blocker is not being taken. He does not see a podiatrist.Eye exam is current.      Review of Systems  Constitutional: Negative for fever, chills, weight loss,  diaphoresis, activity change, appetite change, fatigue and unexpected weight change.  HENT: Negative.   Eyes: Negative.  Negative for blurred vision.  Respiratory: Negative for apnea, cough, choking, chest tightness, shortness of breath and stridor.   Cardiovascular: Negative for chest pain, palpitations and leg swelling.  Gastrointestinal: Negative for nausea, vomiting, abdominal pain, diarrhea, constipation, blood in stool and abdominal distention.  Genitourinary: Negative.  Negative for polyuria.  Musculoskeletal: Positive for myalgias. Negative for back pain, joint swelling, arthralgias and gait problem.  Skin: Negative for color change, pallor, rash and wound.  Neurological: Negative for dizziness, tremors, focal weakness, seizures, syncope, facial asymmetry, speech difficulty, weakness, light-headedness, numbness and headaches.  Hematological: Negative for polydipsia, polyphagia and adenopathy. Does not bruise/bleed easily.  Psychiatric/Behavioral: Negative.        Objective:   Physical Exam  Vitals reviewed. Constitutional: He is oriented to person, place, and time. He appears well-developed and well-nourished. No distress.  HENT:  Head: Normocephalic and atraumatic.  Mouth/Throat: Oropharynx is clear and moist. No oropharyngeal exudate.  Eyes: Conjunctivae are normal. Right eye exhibits no discharge. Left eye exhibits no discharge. No scleral icterus.  Neck: Normal range of motion. Neck supple. No JVD present. No tracheal deviation present. No thyromegaly present.  Cardiovascular: Normal rate, regular rhythm, normal heart sounds and intact distal pulses.  Exam reveals no gallop and no friction rub.   No murmur heard. Pulmonary/Chest: Effort normal and breath sounds normal. No stridor. No respiratory distress. He has no wheezes. He has no rales. He exhibits no tenderness.  Abdominal: Soft. Bowel sounds are normal. He exhibits no distension and no mass. There is no tenderness. There  is no rebound and no guarding.  Musculoskeletal: Normal range of motion. He exhibits no edema and no tenderness.  Lymphadenopathy:  He has no cervical adenopathy.  Neurological: He is oriented to person, place, and time.  Skin: Skin is warm and dry. No rash noted. He is not diaphoretic. No erythema. No pallor.  Psychiatric: He has a normal mood and affect. His behavior is normal. Judgment and thought content normal.      Lab Results  Component Value Date   WBC 8.3 08/01/2011   HGB 16.5 08/01/2011   HCT 46.3 08/01/2011   PLT 129* 08/01/2011   GLUCOSE 95 08/01/2011   CHOL 83 07/21/2011   TRIG 51.0 07/21/2011   HDL 37.60* 07/21/2011   LDLDIRECT 98.8 03/15/2011   LDLCALC 35 07/21/2011   ALT 95* 08/01/2011   AST 38* 08/01/2011   NA 140 08/01/2011   K 4.0 08/01/2011   CL 105 08/01/2011   CREATININE 0.80 08/01/2011   BUN 9 08/01/2011   CO2 25 08/01/2011   TSH 1.16 03/15/2011   PSA 0.58 03/15/2011   INR 1.0 12/19/2008   HGBA1C 6.1 07/21/2011   MICROALBUR 0.2 10/08/2008      Assessment & Plan:

## 2011-09-29 NOTE — Assessment & Plan Note (Signed)
His BP is well controlled 

## 2011-09-29 NOTE — Assessment & Plan Note (Signed)
He is due for an a1c check and I will monitor his renal function as well

## 2011-09-29 NOTE — Assessment & Plan Note (Signed)
He has myalgias in his legs so I have asked him to stop crestor for a few weeks and we'll so if that helps, also I will check his CPK level to see if he has an elevation there and to look for rhabdo, I will check his LFT's as well

## 2011-09-30 ENCOUNTER — Encounter: Payer: Self-pay | Admitting: Internal Medicine

## 2011-10-14 ENCOUNTER — Ambulatory Visit: Payer: Medicare Other | Admitting: Internal Medicine

## 2011-10-18 ENCOUNTER — Encounter: Payer: Self-pay | Admitting: *Deleted

## 2011-10-18 ENCOUNTER — Ambulatory Visit (INDEPENDENT_AMBULATORY_CARE_PROVIDER_SITE_OTHER): Payer: Medicare Other | Admitting: Licensed Clinical Social Worker

## 2011-10-18 ENCOUNTER — Encounter (HOSPITAL_COMMUNITY): Payer: Self-pay | Admitting: Licensed Clinical Social Worker

## 2011-10-18 ENCOUNTER — Telehealth: Payer: Self-pay | Admitting: *Deleted

## 2011-10-18 DIAGNOSIS — F200 Paranoid schizophrenia: Secondary | ICD-10-CM

## 2011-10-18 DIAGNOSIS — F1021 Alcohol dependence, in remission: Secondary | ICD-10-CM

## 2011-10-18 NOTE — Telephone Encounter (Signed)
Pt NOS for previsit 10/18/11  Left message on answering machine.  Sent NOS letter  [name]

## 2011-10-18 NOTE — Progress Notes (Signed)
   THERAPIST PROGRESS NOTE  Session Time: 8:30am-9:20am  Participation Level: Active  Behavioral Response: Well GroomedAlertEuthymic  Type of Therapy: Individual Therapy  Treatment Goals addressed: Coping and Diagnosis: paranoid schizophrenia, ETOH dependence in early remission  Interventions: Motivational Interviewing, Strength-based and Supportive  Summary: [name] is a 51 y.o. male who presents with euthymic mood and affect. This is patients first session since July of 2012. He had missed multiple appointments after July and attributes this to drinking alcohol again. He says that he has lied for a year and a half and that he started abusing alcohol again before his mother died. He after his mother died, his consumption increased and at his worst he drank 6 large bottles of wine per day. He went to [place] in an attempt to avoid the grief process and drank excessively while there. He decided on December 12th to stop drinking and returned to [place].  Since returning, he attends two AA meetings per day and has not drank since the 12th of December. He is pleased that he has been honest and is able to recognize his inappropriate behavior in the past while intoxicated. He endorses paranoia and AH and he is not currently on an anti-psychotic. He will talk to his PCP about an anti-psychotic who has agreed to provide this for him until he finds a new psychiatrist. His sleep and appetite are wnl.   Suicidal/Homicidal: Nowithout intent/plan  Therapist Response: Patient is pleasant today and pleased to be back in therapy. Assisted him with expression of his grief and educated/noramlized patients current grief process. Praised patient for his sobriety and encourage him to obtain a sponsor. Used motivational interviewing to assist and encourage patient through the change process. Explored patients barriers to change. Reviewed patients self care plan. Assessed his progress related to self care.  Patient's self care is fair. Recommend proper diet, regular exercise, socialization and recreation. He continues to harbor anger and resentment towards his siblings. Will focus on this in future sessions.  Plan: Return again in one weeks.  Diagnosis: Axis I: paranoid schizophrenia, etoh dependence in early remission    Axis II: Deferred    Torianne Laflam, LCSW 10/18/2011

## 2011-10-25 ENCOUNTER — Telehealth: Payer: Self-pay | Admitting: *Deleted

## 2011-10-25 ENCOUNTER — Ambulatory Visit (HOSPITAL_COMMUNITY): Payer: Medicare Other | Admitting: Licensed Clinical Social Worker

## 2011-10-25 NOTE — Telephone Encounter (Signed)
No show for previsit.  Send no show letter.  [name]

## 2011-10-26 ENCOUNTER — Ambulatory Visit: Payer: 59 | Admitting: Internal Medicine

## 2011-10-31 ENCOUNTER — Other Ambulatory Visit: Payer: 59 | Admitting: Internal Medicine

## 2011-10-31 ENCOUNTER — Other Ambulatory Visit: Payer: Self-pay | Admitting: Internal Medicine

## 2011-11-01 ENCOUNTER — Ambulatory Visit (HOSPITAL_COMMUNITY): Payer: Medicare Other | Admitting: Licensed Clinical Social Worker

## 2011-11-08 ENCOUNTER — Ambulatory Visit (INDEPENDENT_AMBULATORY_CARE_PROVIDER_SITE_OTHER): Payer: Medicare Other | Admitting: Licensed Clinical Social Worker

## 2011-11-08 ENCOUNTER — Encounter (HOSPITAL_COMMUNITY): Payer: Self-pay | Admitting: Licensed Clinical Social Worker

## 2011-11-08 DIAGNOSIS — F200 Paranoid schizophrenia: Secondary | ICD-10-CM

## 2011-11-08 NOTE — Progress Notes (Signed)
   THERAPIST PROGRESS NOTE  Session Time: 8:30am-9:20am  Participation Level: Active  Behavioral Response: Well GroomedAlertEuthymic  Type of Therapy: Individual Therapy  Treatment Goals addressed: Anxiety, Coping and Diagnosis: paranoid schizophrenia  Interventions: Motivational Interviewing, Strength-based, Supportive and Other: grief   Summary: [name] is a 51 y.o. male who presents with euthymic mood and bright affect. He reports that he is doing well, continues to maintain his sobriety, attends daily AA meetings and has a temporary sponsor. He has felt tempted to drink twice, once while watching a commercial and once while he was in the store and was down the liquor isle. He did not act upon his desires and is proud about that. He goes to the [facility] each day to exercise for up to three hours at a time. He bought a dog and finds this companionship helpful and healthy. The anniversary of his mothers death is [id number] he may go to her grave site for the first time. He wants to reconcile with his sisters and wants to let go of his anger. His AH are at baseline, but keeping himself busy helps decrease them. His sleep and appetite are wnl. His paranoia is well controlled.    Suicidal/Homicidal: Nowithout intent/plan  Therapist Response: Patient appears well today, with energy and excitement about the progress he is making. Praised him for his commitment to sobriety and his decision to quit smoking tomorrow. He is motivated for change and is following through on his goals. Assisted with his grief reaction, normalized his grief and processed his feelings about the anniversary of his mothers death. Processed w/him his desire to reconcile with his sisters. Challenged his anger which remains and provided feedback about the negative consequences he experiences as a result of his resentment. Used CBT to assist patient with the identification of his negative distortions and irrational  thoughts. Encouraged patient to verbalize alternative and factual responses which challenge his distortions. Used motivational interviewing to assist and encourage patient through the change process. Explored patients barriers to change. Reviewed patients self care plan. Assessed his progress related to self care. Patient's self care is good. Recommend proper diet, regular exercise, socialization and recreation.   Plan: Return again in one weeks.  Diagnosis: Axis I: Paranoid Schizophrenia    Axis II: No diagnosis    Princesa Willig, LCSW 11/08/2011

## 2011-11-15 ENCOUNTER — Telehealth: Payer: Self-pay | Admitting: Internal Medicine

## 2011-11-15 ENCOUNTER — Ambulatory Visit: Payer: Self-pay | Admitting: Internal Medicine

## 2011-11-15 NOTE — Telephone Encounter (Signed)
We only have albuterol listed on med list. No atrovent. ATC the number provided for [facility] and this was a fax number. ATC and ask the pt what he is needing and he did not answer, no option to leave a msg, WCB.

## 2011-11-16 MED ORDER — ALBUTEROL SULFATE (2.5 MG/3ML) 0.083% IN NEBU: 2.5000 mg | INHALATION_SOLUTION | RESPIRATORY_TRACT | Status: DC | PRN

## 2011-11-16 MED ORDER — ALBUTEROL SULFATE HFA 108 (90 BASE) MCG/ACT IN AERS: 2.0000 | INHALATION_SPRAY | Freq: Four times a day (QID) | RESPIRATORY_TRACT | Status: DC | PRN

## 2011-11-16 NOTE — Telephone Encounter (Signed)
Called, spoke with pt.    States he needs the albuterol for neb and proair sent to [facility].  He does not need atrovent and isn't taking this.  Advised rxs will be sent -- nothing further needed.

## 2011-11-18 ENCOUNTER — Ambulatory Visit (HOSPITAL_COMMUNITY): Payer: Medicare Other | Admitting: Licensed Clinical Social Worker

## 2011-11-21 ENCOUNTER — Other Ambulatory Visit: Payer: Self-pay

## 2011-11-21 ENCOUNTER — Telehealth: Payer: Self-pay | Admitting: Internal Medicine

## 2011-11-21 DIAGNOSIS — J449 Chronic obstructive pulmonary disease, unspecified: Secondary | ICD-10-CM

## 2011-11-21 NOTE — Telephone Encounter (Signed)
Spoke with pt and he needs an order a portable nebulizer to be sent to [facility].  Pt's current machine is broken .  Pt instructed that order will be sent to [facility] and he should receive a call from [facility] regarding delivery of machine.

## 2011-11-21 NOTE — Telephone Encounter (Signed)
Unable to reach anyone at [facility] after 5:30pm. Will try again in the morning.

## 2011-11-21 NOTE — Telephone Encounter (Signed)
Patient called requesting refills on Vicodin and hydrocodone [facility]. Patient last seen 10/09/11 Thanks

## 2011-11-22 NOTE — Telephone Encounter (Signed)
LMTCB for someone from [facility] TCB

## 2011-11-22 NOTE — Telephone Encounter (Signed)
Request is for xanax and hydrocodone, Thanks

## 2011-11-23 ENCOUNTER — Other Ambulatory Visit: Payer: Self-pay | Admitting: Internal Medicine

## 2011-11-23 DIAGNOSIS — I10 Essential (primary) hypertension: Secondary | ICD-10-CM

## 2011-11-23 DIAGNOSIS — F418 Other specified anxiety disorders: Secondary | ICD-10-CM

## 2011-11-23 MED ORDER — ALPRAZOLAM 1 MG PO TABS: 1.0000 mg | ORAL_TABLET | Freq: Three times a day (TID) | ORAL | Status: DC | PRN

## 2011-11-23 MED ORDER — HYDROCODONE-ACETAMINOPHEN 10-660 MG PO TABS: 1.0000 | ORAL_TABLET | Freq: Four times a day (QID) | ORAL | Status: DC | PRN

## 2011-11-23 NOTE — Telephone Encounter (Signed)
I spoke with April at [facility] and she said they are needing there form signed by MR for pt's nebulizer medications. I have asked April to refax the form and this will be placed in MR look at. Will close msg but forward to [place]. so she is aware they need form ASAP.

## 2011-11-23 NOTE — Telephone Encounter (Signed)
Patient notified that RX ready and faxed to [facility] per his request

## 2011-11-24 ENCOUNTER — Telehealth: Payer: Self-pay | Admitting: Internal Medicine

## 2011-11-24 ENCOUNTER — Ambulatory Visit (HOSPITAL_COMMUNITY): Payer: Medicare Other | Admitting: Licensed Clinical Social Worker

## 2011-11-24 NOTE — Telephone Encounter (Signed)
Per pt's chart, pt is on spiriva and albuterol neb soln PRN.  Last ov note with MR 02/2011, pt to continue taking the spiriva daily.  Called spoke with April, informed her of this.  She stated that she will make a note of this in pt's fine.  Nothing further needed at this time, will sign off.

## 2011-11-30 ENCOUNTER — Ambulatory Visit (HOSPITAL_COMMUNITY): Payer: Medicare Other | Admitting: Licensed Clinical Social Worker

## 2011-12-01 ENCOUNTER — Ambulatory Visit: Payer: Self-pay | Admitting: Internal Medicine

## 2011-12-05 ENCOUNTER — Other Ambulatory Visit (INDEPENDENT_AMBULATORY_CARE_PROVIDER_SITE_OTHER): Payer: Medicare Other

## 2011-12-05 ENCOUNTER — Encounter: Payer: Self-pay | Admitting: Internal Medicine

## 2011-12-05 ENCOUNTER — Ambulatory Visit (INDEPENDENT_AMBULATORY_CARE_PROVIDER_SITE_OTHER): Payer: Medicare Other | Admitting: Internal Medicine

## 2011-12-05 DIAGNOSIS — F418 Other specified anxiety disorders: Secondary | ICD-10-CM

## 2011-12-05 DIAGNOSIS — N529 Male erectile dysfunction, unspecified: Secondary | ICD-10-CM

## 2011-12-05 DIAGNOSIS — R509 Fever, unspecified: Secondary | ICD-10-CM

## 2011-12-05 DIAGNOSIS — J449 Chronic obstructive pulmonary disease, unspecified: Secondary | ICD-10-CM

## 2011-12-05 DIAGNOSIS — E118 Type 2 diabetes mellitus with unspecified complications: Secondary | ICD-10-CM

## 2011-12-05 DIAGNOSIS — J4489 Other specified chronic obstructive pulmonary disease: Secondary | ICD-10-CM

## 2011-12-05 DIAGNOSIS — F17200 Nicotine dependence, unspecified, uncomplicated: Secondary | ICD-10-CM

## 2011-12-05 DIAGNOSIS — E782 Mixed hyperlipidemia: Secondary | ICD-10-CM

## 2011-12-05 DIAGNOSIS — F341 Dysthymic disorder: Secondary | ICD-10-CM

## 2011-12-05 DIAGNOSIS — I10 Essential (primary) hypertension: Secondary | ICD-10-CM

## 2011-12-05 LAB — LIPID PANEL
HDL: 35.3 mg/dL — ABNORMAL LOW (ref >39.00)
Total CHOL/HDL Ratio: 5
Triglycerides: 168 mg/dL — ABNORMAL HIGH (ref 0.0–149.0)
VLDL: 33.6 mg/dL (ref 0.0–40.0)

## 2011-12-05 LAB — COMPREHENSIVE METABOLIC PANEL
AST: 19 U/L (ref 0–37)
Alkaline Phosphatase: 62 U/L (ref 39–117)
BUN: 16 mg/dL (ref 6–23)
Creatinine, Ser: 0.8 mg/dL (ref 0.4–1.5)

## 2011-12-05 MED ORDER — PITAVASTATIN CALCIUM 2 MG PO TABS: 1.0000 | ORAL_TABLET | Freq: Every day | ORAL | Status: DC

## 2011-12-05 MED ORDER — OMEGA-3-ACID ETHYL ESTERS 1 G PO CAPS: 2.0000 g | ORAL_CAPSULE | Freq: Two times a day (BID) | ORAL | Status: DC

## 2011-12-05 MED ORDER — BUPROPION HCL ER (XL) 150 MG PO TB24: 150.0000 mg | ORAL_TABLET | Freq: Every day | ORAL | Status: DC

## 2011-12-05 NOTE — Assessment & Plan Note (Signed)
When he took crestor he had myalgias in his calves but that has resolved since he stopped crestor so I have given him samples of livalo and lovaza today to see if he can tolerate that

## 2011-12-05 NOTE — Assessment & Plan Note (Signed)
His BP is well controlled 

## 2011-12-05 NOTE — Assessment & Plan Note (Signed)
He has persistent apathy and fatigue so I have added bupropion to his med regimen

## 2011-12-05 NOTE — Patient Instructions (Signed)
Hypertriglyceridemia  Diet for High blood levels of Triglycerides Most fats in food are triglycerides. Triglycerides in your blood are stored as fat in your body. High levels of triglycerides in your blood may put you at a greater risk for heart disease and stroke.  Normal triglyceride levels are less than 150 mg/dL. Borderline high levels are 150-199 mg/dl. High levels are 200 - 499 mg/dL, and very high triglyceride levels are greater than 500 mg/dL. The decision to treat high triglycerides is generally based on the level. For people with borderline or high triglyceride levels, treatment includes weight loss and exercise. Drugs are recommended for people with very high triglyceride levels. Many people who need treatment for high triglyceride levels have metabolic syndrome. This syndrome is a collection of disorders that often include: insulin resistance, high blood pressure, blood clotting problems, high cholesterol and triglycerides. TESTING PROCEDURE FOR TRIGLYCERIDES  You should not eat 4 hours before getting your triglycerides measured. The normal range of triglycerides is between 10 and 250 milligrams per deciliter (mg/dl). Some people may have extreme levels (1000 or above), but your triglyceride level may be too high if it is above 150 mg/dl, depending on what other risk factors you have for heart disease.   People with high blood triglycerides may also have high blood cholesterol levels. If you have high blood cholesterol as well as high blood triglycerides, your risk for heart disease is probably greater than if you only had high triglycerides. High blood cholesterol is one of the main risk factors for heart disease.  CHANGING YOUR DIET  Your weight can affect your blood triglyceride level. If you are more than 20% above your ideal body weight, you may be able to lower your blood triglycerides by losing weight. Eating less and exercising regularly is the best way to combat this. Fat provides  more calories than any other food. The best way to lose weight is to eat less fat. Only 30% of your total calories should come from fat. Less than 7% of your diet should come from saturated fat. A diet low in fat and saturated fat is the same as a diet to decrease blood cholesterol. By eating a diet lower in fat, you may lose weight, lower your blood cholesterol, and lower your blood triglyceride level.  Eating a diet low in fat, especially saturated fat, may also help you lower your blood triglyceride level. Ask your dietitian to help you figure how much fat you can eat based on the number of calories your caregiver has prescribed for you.  Exercise, in addition to helping with weight loss may also help lower triglyceride levels.   Alcohol can increase blood triglycerides. You may need to stop drinking alcoholic beverages.   Too much carbohydrate in your diet may also increase your blood triglycerides. Some complex carbohydrates are necessary in your diet. These may include bread, rice, potatoes, other starchy vegetables and cereals.   Reduce "simple" carbohydrates. These may include pure sugars, candy, honey, and jelly without losing other nutrients. If you have the kind of high blood triglycerides that is affected by the amount of carbohydrates in your diet, you will need to eat less sugar and less high-sugar foods. Your caregiver can help you with this.   Adding 2-4 grams of fish oil (EPA+ DHA) may also help lower triglycerides. Speak with your caregiver before adding any supplements to your regimen.  Following the Diet  Maintain your ideal weight. Your caregivers can help you with a diet. Generally,   eating less food and getting more exercise will help you lose weight. Joining a weight control group may also help. Ask your caregivers for a good weight control group in your area.  Eat low-fat foods instead of high-fat foods. This can help you lose weight too.  These foods are lower in fat. Eat MORE  of these:   Dried beans, peas, and lentils.   Egg whites.   Low-fat cottage cheese.   Fish.   Lean cuts of meat, such as round, sirloin, rump, and flank (cut extra fat off meat you fix).   Whole grain breads, cereals and pasta.   Skim and nonfat dry milk.   Low-fat yogurt.   Poultry without the skin.   Cheese made with skim or part-skim milk, such as mozzarella, parmesan, farmers', ricotta, or pot cheese.  These are higher fat foods. Eat LESS of these:   Whole milk and foods made from whole milk, such as American, blue, cheddar, monterey jack, and swiss cheese   High-fat meats, such as luncheon meats, sausages, knockwurst, bratwurst, hot dogs, ribs, corned beef, ground pork, and regular ground beef.   Fried foods.  Limit saturated fats in your diet. Substituting unsaturated fat for saturated fat may decrease your blood triglyceride level. You will need to read package labels to know which products contain saturated fats.  These foods are high in saturated fat. Eat LESS of these:   Fried pork skins.   Whole milk.   Skin and fat from poultry.   Palm oil.   Butter.   Shortening.   Cream cheese.   Bacon.   Margarines and baked goods made from listed oils.   Vegetable shortenings.   Chitterlings.   Fat from meats.   Coconut oil.   Palm kernel oil.   Lard.   Cream.   Sour cream.   Fatback.   Coffee whiteners and non-dairy creamers made with these oils.   Cheese made from whole milk.  Use unsaturated fats (both polyunsaturated and monounsaturated) moderately. Remember, even though unsaturated fats are better than saturated fats; you still want a diet low in total fat.  These foods are high in unsaturated fat:   Canola oil.   Sunflower oil.   Mayonnaise.   Almonds.   Peanuts.   Pine nuts.   Margarines made with these oils.   Safflower oil.   Olive oil.   Avocados.   Cashews.   Peanut butter.   Sunflower seeds.   Soybean oil.     Peanut oil.   Olives.   Pecans.   Walnuts.   Pumpkin seeds.  Avoid sugar and other high-sugar foods. This will decrease carbohydrates without decreasing other nutrients. Sugar in your food goes rapidly to your blood. When there is excess sugar in your blood, your liver may use it to make more triglycerides. Sugar also contains calories without other important nutrients.  Eat LESS of these:   Sugar, brown sugar, powdered sugar, jam, jelly, preserves, honey, syrup, molasses, pies, candy, cakes, cookies, frosting, pastries, colas, soft drinks, punches, fruit drinks, and regular gelatin.   Avoid alcohol. Alcohol, even more than sugar, may increase blood triglycerides. In addition, alcohol is high in calories and low in nutrients. Ask for sparkling water, or a diet soft drink instead of an alcoholic beverage.  Suggestions for planning and preparing meals   Bake, broil, grill or roast meats instead of frying.   Remove fat from meats and skin from poultry before cooking.   Add spices,   herbs, lemon juice or vinegar to vegetables instead of salt, rich sauces or gravies.   Use a non-stick skillet without fat or use no-stick sprays.   Cool and refrigerate stews and broth. Then remove the hardened fat floating on the surface before serving.   Refrigerate meat drippings and skim off fat to make low-fat gravies.   Serve more fish.   Use less butter, margarine and other high-fat spreads on bread or vegetables.   Use skim or reconstituted non-fat dry milk for cooking.   Cook with low-fat cheeses.   Substitute low-fat yogurt or cottage cheese for all or part of the sour cream in recipes for sauces, dips or congealed salads.   Use half yogurt/half mayonnaise in salad recipes.   Substitute evaporated skim milk for cream. Evaporated skim milk or reconstituted non-fat dry milk can be whipped and substituted for whipped cream in certain recipes.   Choose fresh fruits for dessert instead of  high-fat foods such as pies or cakes. Fruits are naturally low in fat.  When Dining Out   Order low-fat appetizers such as fruit or vegetable juice, pasta with vegetables or tomato sauce.   Select clear, rather than cream soups.   Ask that dressings and gravies be served on the side. Then use less of them.   Order foods that are baked, broiled, poached, steamed, stir-fried, or roasted.   Ask for margarine instead of butter, and use only a small amount.   Drink sparkling water, unsweetened tea or coffee, or diet soft drinks instead of alcohol or other sweet beverages.  QUESTIONS AND ANSWERS ABOUT OTHER FATS IN THE BLOOD: SATURATED FAT, TRANS FAT, AND CHOLESTEROL What is trans fat? Trans fat is a type of fat that is formed when vegetable oil is hardened through a process called hydrogenation. This process helps makes foods more solid, gives them shape, and prolongs their shelf life. Trans fats are also called hydrogenated or partially hydrogenated oils.  What do saturated fat, trans fat, and cholesterol in foods have to do with heart disease? Saturated fat, trans fat, and cholesterol in the diet all raise the level of LDL "bad" cholesterol in the blood. The higher the LDL cholesterol, the greater the risk for coronary heart disease (CHD). Saturated fat and trans fat raise LDL similarly.  What foods contain saturated fat, trans fat, and cholesterol? High amounts of saturated fat are found in animal products, such as fatty cuts of meat, chicken skin, and full-fat dairy products like butter, whole milk, cream, and cheese, and in tropical vegetable oils such as palm, palm kernel, and coconut oil. Trans fat is found in some of the same foods as saturated fat, such as vegetable shortening, some margarines (especially hard or stick margarine), crackers, cookies, baked goods, fried foods, salad dressings, and other processed foods made with partially hydrogenated vegetable oils. Small amounts of trans fat  also occur naturally in some animal products, such as milk products, beef, and lamb. Foods high in cholesterol include liver, other organ meats, egg yolks, shrimp, and full-fat dairy products. How can I use the new food label to make heart-healthy food choices? Check the Nutrition Facts panel of the food label. Choose foods lower in saturated fat, trans fat, and cholesterol. For saturated fat and cholesterol, you can also use the Percent Daily Value (%DV): 5% DV or less is low, and 20% DV or more is high. (There is no %DV for trans fat.) Use the Nutrition Facts panel to choose foods low in   saturated fat and cholesterol, and if the trans fat is not listed, read the ingredients and limit products that list shortening or hydrogenated or partially hydrogenated vegetable oil, which tend to be high in trans fat. POINTS TO REMEMBER: YOU NEED A LITTLE TLC (THERAPEUTIC LIFESTYLE CHANGES)  Discuss your risk for heart disease with your caregivers, and take steps to reduce risk factors.   Change your diet. Choose foods that are low in saturated fat, trans fat, and cholesterol.   Add exercise to your daily routine if it is not already being done. Participate in physical activity of moderate intensity, like brisk walking, for at least 30 minutes on most, and preferably all days of the week. No time? Break the 30 minutes into three, 10-minute segments during the day.   Stop smoking. If you do smoke, contact your caregiver to discuss ways in which they can help you quit.   Do not use street drugs.   Maintain a normal weight.   Maintain a healthy blood pressure.   Keep up with your blood work for checking the fats in your blood as directed by your caregiver.  Document Released: 06/30/2004 Document Revised: 09/01/2011 Document Reviewed: 01/26/2009 [employer] Patient Information 2012 [employer]. 

## 2011-12-05 NOTE — Assessment & Plan Note (Signed)
He has decided to add wellbutrin for depression and fatigue and he will try to quit smoking with this addition

## 2011-12-05 NOTE — Assessment & Plan Note (Signed)
I will check his testosterone level today to see if the fatigue is caused by low T

## 2011-12-05 NOTE — Assessment & Plan Note (Signed)
I will recheck his HIV status

## 2011-12-05 NOTE — Assessment & Plan Note (Signed)
I will check his a1c today 

## 2011-12-05 NOTE — Progress Notes (Signed)
Subjective:    Patient ID: [name], male    DOB: 09-20-1961, 51 y.o.   MRN: [id number]  Hyperlipidemia This is a chronic problem. The current episode started more than 1 year ago. The problem is resistant. Recent lipid tests were reviewed and are variable. Exacerbating diseases include diabetes and obesity. He has no history of chronic renal disease, hypothyroidism, liver disease or nephrotic syndrome. Factors aggravating his hyperlipidemia include no known factors. Pertinent negatives include no chest pain, focal sensory loss, focal weakness, leg pain, myalgias or shortness of breath. He is currently on no antihyperlipidemic treatment. The current treatment provides moderate improvement of lipids. Compliance problems include adherence to exercise and adherence to diet.       Review of Systems  Constitutional: Positive for fever and fatigue. Negative for chills, diaphoresis, activity change, appetite change and unexpected weight change.  HENT: Negative.   Eyes: Negative.   Respiratory: Negative for apnea, cough, choking, chest tightness, shortness of breath, wheezing and stridor.   Cardiovascular: Negative for chest pain, palpitations and leg swelling.  Gastrointestinal: Negative for nausea, vomiting, abdominal pain, diarrhea, constipation, blood in stool, abdominal distention, anal bleeding and rectal pain.  Genitourinary: Negative for dysuria, urgency, frequency, hematuria, flank pain, decreased urine volume, discharge, penile swelling, scrotal swelling, enuresis, difficulty urinating, genital sores, penile pain and testicular pain.  Musculoskeletal: Negative for myalgias, back pain, joint swelling, arthralgias and gait problem.  Skin: Negative for color change, pallor, rash and wound.  Neurological: Negative for dizziness, tremors, focal weakness, seizures, syncope, facial asymmetry, speech difficulty, weakness, light-headedness, numbness and headaches.  Hematological: Negative for  adenopathy. Does not bruise/bleed easily.  Psychiatric/Behavioral: Positive for dysphoric mood (anhedonia, apathy, fatigue, hypersomnolence) and decreased concentration. Negative for suicidal ideas, hallucinations, behavioral problems, confusion, sleep disturbance, self-injury and agitation. The patient is nervous/anxious. The patient is not hyperactive.        Objective:   Physical Exam  Vitals reviewed. Constitutional: He is oriented to person, place, and time. He appears well-developed and well-nourished. No distress.  HENT:  Head: Normocephalic and atraumatic.  Mouth/Throat: Oropharynx is clear and moist. No oropharyngeal exudate.  Eyes: Conjunctivae are normal. Right eye exhibits no discharge. Left eye exhibits no discharge. No scleral icterus.  Neck: Normal range of motion. Neck supple. No JVD present. No tracheal deviation present. No thyromegaly present.  Cardiovascular: Normal rate, regular rhythm, normal heart sounds and intact distal pulses.  Exam reveals no gallop and no friction rub.   No murmur heard. Pulmonary/Chest: Effort normal and breath sounds normal. No stridor. No respiratory distress. He has no wheezes. He has no rales. He exhibits no tenderness.  Abdominal: Soft. Bowel sounds are normal. He exhibits no distension and no mass. There is no tenderness. There is no rebound and no guarding.  Musculoskeletal: Normal range of motion. He exhibits no edema and no tenderness.  Lymphadenopathy:    He has no cervical adenopathy.  Neurological: He is oriented to person, place, and time.  Skin: Skin is warm and dry. No rash noted. He is not diaphoretic. No erythema. No pallor.  Psychiatric: He has a normal mood and affect. His behavior is normal. Judgment and thought content normal.      Lab Results  Component Value Date   WBC 10.0 09/29/2011   HGB 16.8 09/29/2011   HCT 49.2 09/29/2011   PLT 134.0* 09/29/2011   GLUCOSE 101* 09/29/2011   CHOL 125 09/29/2011   TRIG 209.0* 09/29/2011    HDL 31.20* 09/29/2011  LDLDIRECT 71.2 09/29/2011   LDLCALC 35 07/21/2011   ALT 22 09/29/2011   AST 13 09/29/2011   NA 143 09/29/2011   K 4.5 09/29/2011   CL 112 09/29/2011   CREATININE 0.9 09/29/2011   BUN 8 09/29/2011   CO2 29 09/29/2011   TSH 1.16 03/15/2011   PSA 0.58 03/15/2011   INR 1.0 12/19/2008   HGBA1C 5.9 09/29/2011   MICROALBUR 0.2 10/08/2008      Assessment & Plan:

## 2011-12-06 ENCOUNTER — Telehealth: Payer: Self-pay

## 2011-12-06 ENCOUNTER — Encounter: Payer: Self-pay | Admitting: Internal Medicine

## 2011-12-06 LAB — TESTOSTERONE, FREE, TOTAL, SHBG
Testosterone, Free: 63.6 pg/mL (ref 47.0–244.0)
Testosterone-% Free: 2.1 % (ref 1.6–2.9)

## 2011-12-06 LAB — HIV ANTIBODY (ROUTINE TESTING W REFLEX): HIV: NONREACTIVE

## 2011-12-06 NOTE — Telephone Encounter (Signed)
Patient informed of lab results and would like to know if testosterone is low enough to start a replacement to help with low energy and sex drive.

## 2011-12-06 NOTE — Telephone Encounter (Signed)
Returned call to patient.  LMOVM 

## 2011-12-06 NOTE — Telephone Encounter (Signed)
no

## 2011-12-08 ENCOUNTER — Ambulatory Visit (HOSPITAL_COMMUNITY): Payer: Self-pay | Admitting: Licensed Clinical Social Worker

## 2011-12-13 ENCOUNTER — Ambulatory Visit (HOSPITAL_COMMUNITY): Payer: Self-pay | Admitting: Licensed Clinical Social Worker

## 2011-12-16 ENCOUNTER — Telehealth: Payer: Self-pay | Admitting: Internal Medicine

## 2011-12-16 ENCOUNTER — Other Ambulatory Visit: Payer: Self-pay

## 2011-12-16 DIAGNOSIS — E782 Mixed hyperlipidemia: Secondary | ICD-10-CM

## 2011-12-16 MED ORDER — OMEGA-3-ACID ETHYL ESTERS 1 G PO CAPS: 2.0000 g | ORAL_CAPSULE | Freq: Two times a day (BID) | ORAL | Status: DC

## 2011-12-16 NOTE — Telephone Encounter (Signed)
Pt requesting fish oil pill [facility]  Pt ph#  [phone]--

## 2011-12-16 NOTE — Telephone Encounter (Signed)
RX sent e-script 

## 2011-12-21 ENCOUNTER — Telehealth: Payer: Self-pay

## 2011-12-21 MED ORDER — CICLOPIROX & LACQUER REMOVAL 8 % EX KIT: 1.0000 | PACK | Freq: Every day | CUTANEOUS | Status: DC

## 2011-12-21 NOTE — Telephone Encounter (Signed)
Patient called requesting nail fungus ciclotropriox 80% apply qd as directed. He states this was given by for md but no longer sees him. Please send to [facility]

## 2011-12-21 NOTE — Telephone Encounter (Signed)
ok 

## 2011-12-26 ENCOUNTER — Ambulatory Visit (HOSPITAL_COMMUNITY): Payer: Self-pay | Admitting: Licensed Clinical Social Worker

## 2011-12-28 ENCOUNTER — Ambulatory Visit: Payer: 59 | Admitting: Internal Medicine

## 2012-02-04 IMAGING — CR DG CHEST 2V
3 series · 3 of 3 positions shown · non-contrast
Comparison: PA and lateral chest 05/07/2010.CT chest 12/19/2008.

CLINICAL DATA: Shortness of breath and cough.

CHEST - 2 VIEW

[view not recorded (1 of 3)]
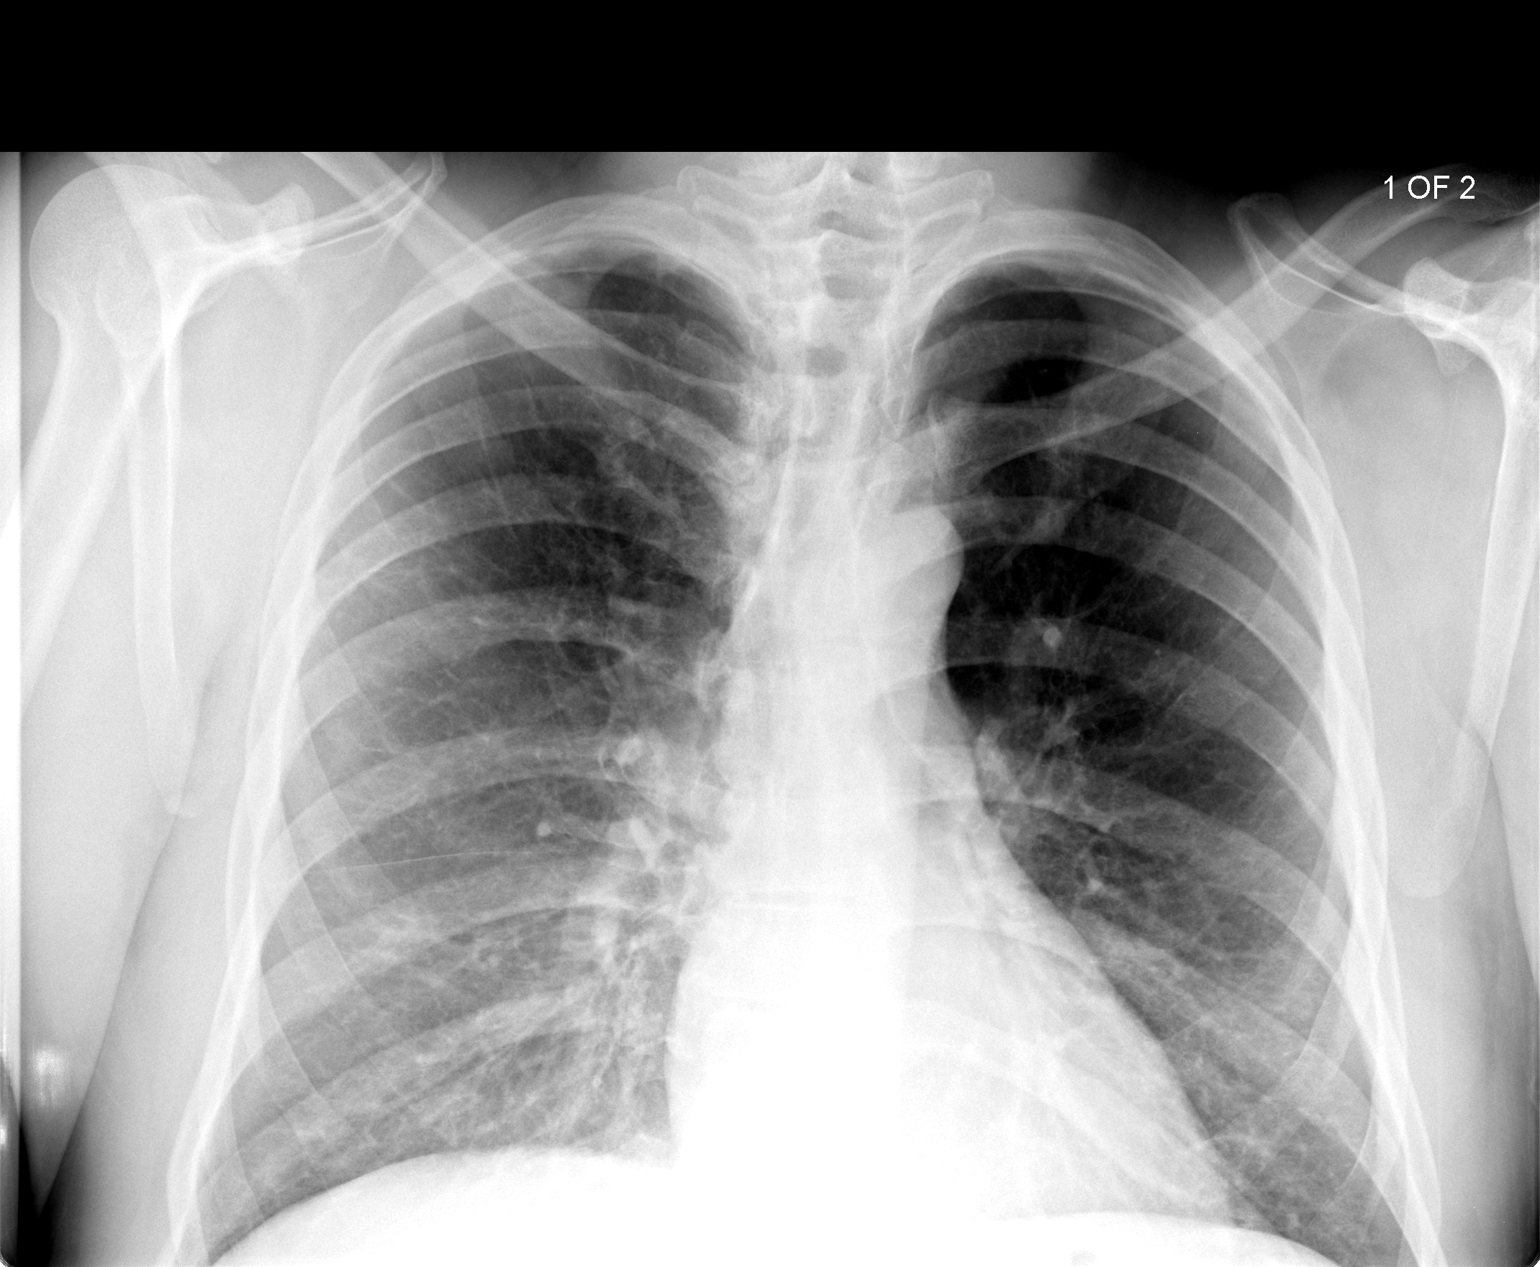

[view not recorded (2 of 3)]
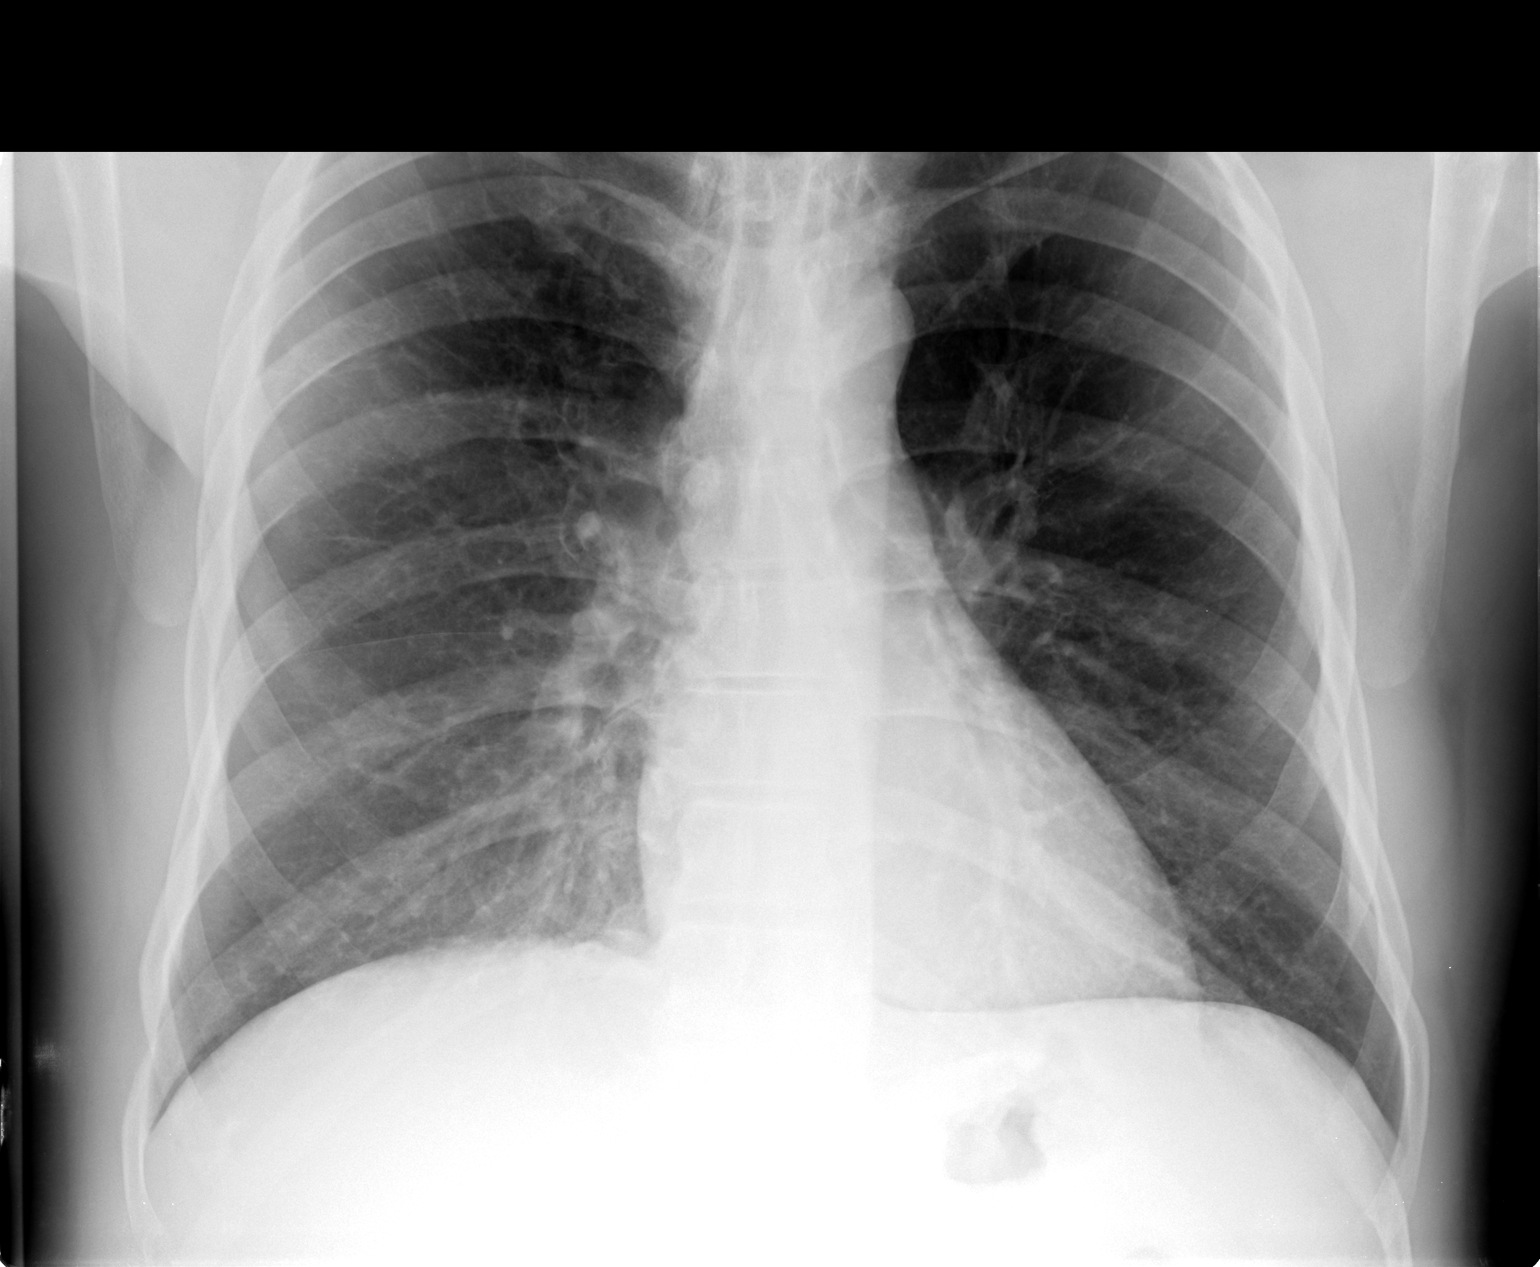

[view not recorded (3 of 3)]
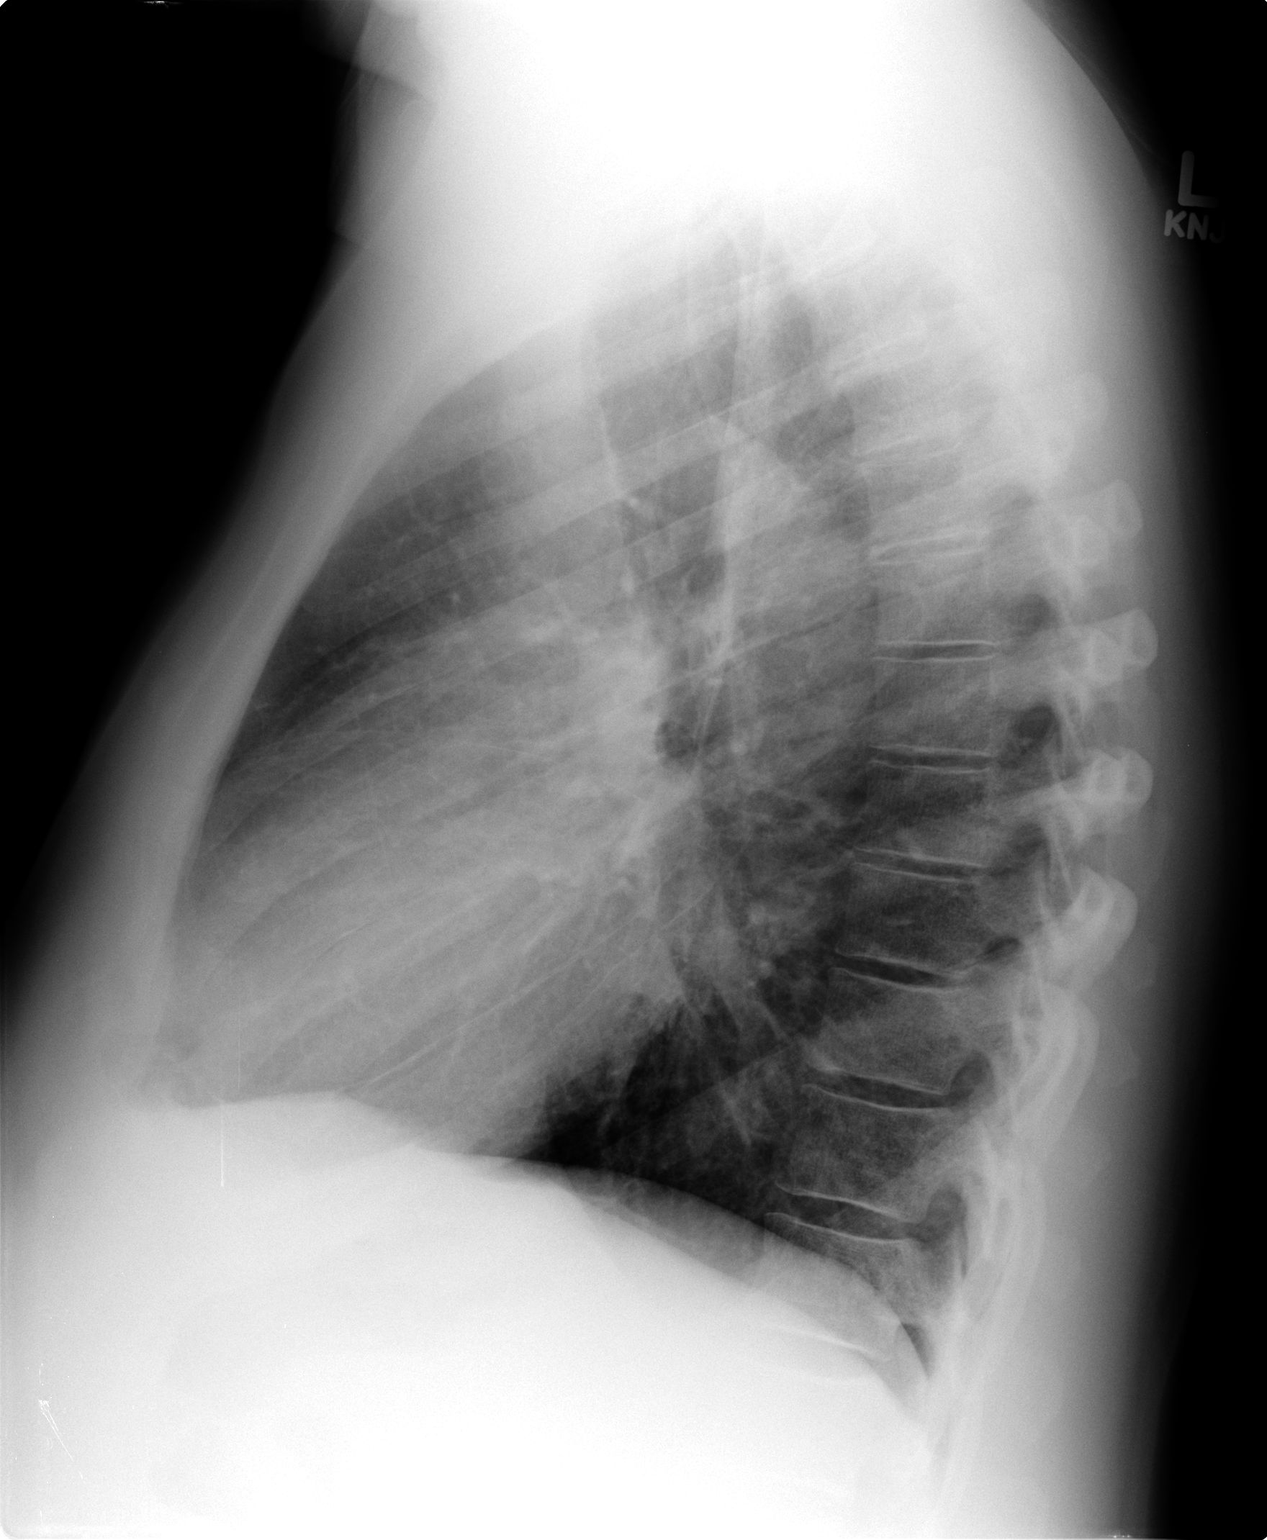

[3 of 3 positions shown; findings below may reference images not displayed]

FINDINGS: The lungs are clear.  No pneumothorax or pleural
effusion.  Heart size normal.
IMPRESSION: No acute disease.

## 2012-03-05 ENCOUNTER — Ambulatory Visit: Payer: Self-pay | Admitting: Internal Medicine

## 2012-03-13 ENCOUNTER — Ambulatory Visit: Payer: Self-pay | Admitting: Internal Medicine

## 2012-03-13 ENCOUNTER — Ambulatory Visit: Payer: Self-pay | Admitting: Cardiovascular Disease

## 2012-03-15 ENCOUNTER — Ambulatory Visit: Payer: Self-pay | Admitting: Internal Medicine

## 2012-03-28 ENCOUNTER — Encounter: Payer: Self-pay | Admitting: Internal Medicine

## 2012-03-28 ENCOUNTER — Telehealth: Payer: Self-pay | Admitting: Internal Medicine

## 2012-03-28 ENCOUNTER — Ambulatory Visit (INDEPENDENT_AMBULATORY_CARE_PROVIDER_SITE_OTHER): Payer: Medicare HMO | Admitting: Internal Medicine

## 2012-03-28 VITALS — BP 128/72 | HR 90 | Temp 99.4°F | Resp 16 | Wt 215.0 lb

## 2012-03-28 DIAGNOSIS — M1710 Unilateral primary osteoarthritis, unspecified knee: Secondary | ICD-10-CM

## 2012-03-28 DIAGNOSIS — M179 Osteoarthritis of knee, unspecified: Secondary | ICD-10-CM

## 2012-03-28 DIAGNOSIS — E782 Mixed hyperlipidemia: Secondary | ICD-10-CM

## 2012-03-28 DIAGNOSIS — IMO0002 Reserved for concepts with insufficient information to code with codable children: Secondary | ICD-10-CM

## 2012-03-28 DIAGNOSIS — F341 Dysthymic disorder: Secondary | ICD-10-CM

## 2012-03-28 DIAGNOSIS — J449 Chronic obstructive pulmonary disease, unspecified: Secondary | ICD-10-CM

## 2012-03-28 DIAGNOSIS — E118 Type 2 diabetes mellitus with unspecified complications: Secondary | ICD-10-CM

## 2012-03-28 DIAGNOSIS — F200 Paranoid schizophrenia: Secondary | ICD-10-CM

## 2012-03-28 DIAGNOSIS — I10 Essential (primary) hypertension: Secondary | ICD-10-CM

## 2012-03-28 DIAGNOSIS — F418 Other specified anxiety disorders: Secondary | ICD-10-CM

## 2012-03-28 MED ORDER — HYDROCODONE-ACETAMINOPHEN 10-660 MG PO TABS: 1.0000 | ORAL_TABLET | Freq: Four times a day (QID) | ORAL | Status: DC | PRN

## 2012-03-28 MED ORDER — TIOTROPIUM BROMIDE MONOHYDRATE 18 MCG IN CAPS: 18.0000 ug | ORAL_CAPSULE | Freq: Every day | RESPIRATORY_TRACT | Status: DC

## 2012-03-28 MED ORDER — QUETIAPINE FUMARATE ER 50 MG PO TB24: 50.0000 mg | ORAL_TABLET | Freq: Every day | ORAL | Status: DC

## 2012-03-28 MED ORDER — CELECOXIB 200 MG PO CAPS: 200.0000 mg | ORAL_CAPSULE | Freq: Every day | ORAL | Status: DC

## 2012-03-28 MED ORDER — ALPRAZOLAM 1 MG PO TABS: 1.0000 mg | ORAL_TABLET | Freq: Three times a day (TID) | ORAL | Status: DC | PRN

## 2012-03-28 MED ORDER — ESOMEPRAZOLE MAGNESIUM 40 MG PO CPDR: 40.0000 mg | DELAYED_RELEASE_CAPSULE | Freq: Every day | ORAL | Status: DC

## 2012-03-28 MED ORDER — PITAVASTATIN CALCIUM 2 MG PO TABS: 1.0000 | ORAL_TABLET | Freq: Every day | ORAL | Status: DC

## 2012-03-28 NOTE — Telephone Encounter (Signed)
Caller: [place]/Patient; PCP: Sanda Linger; CB#: [phone]; Call regarding Seen At 0900; Rx Not At Pharmacy;  Requests Rx. to be sent to [facility].  Reviewed EMR- new Rx for this date Alprazolam, Hydrocodone-APAP, Quetiapine.  Medication Questions - Adult protocol used.  Note to office for follow up.  Caller's cell phone battery low and unable to give further instructions; he asked for message to be left on his cell phone.  Thank you.

## 2012-03-28 NOTE — Progress Notes (Signed)
Subjective:    Patient ID: [name], male    DOB: 29-Sep-1960, 51 y.o.   MRN: [id number]  Anxiety Presents for follow-up visit. Symptoms include depressed mood, excessive worry, insomnia, irritability, nervous/anxious behavior and obsessions (and paranoia). Patient reports no chest pain, compulsions, confusion, decreased concentration, dizziness, dry mouth, feeling of choking, hyperventilation, impotence, malaise, muscle tension, nausea, palpitations, panic, restlessness, shortness of breath or suicidal ideas. Symptoms occur most days. Duration: 5 weeks. The severity of symptoms is mild. The quality of sleep is poor. Nighttime awakenings: occasional.   Compliance with medications is 0-25%.      Review of Systems  Constitutional: Positive for irritability.  HENT: Negative.   Eyes: Negative.   Respiratory: Negative.  Negative for shortness of breath.   Cardiovascular: Negative.  Negative for chest pain and palpitations.  Gastrointestinal: Negative.  Negative for nausea.  Genitourinary: Negative.  Negative for impotence.  Musculoskeletal: Negative.   Skin: Negative.   Neurological: Negative.  Negative for dizziness.  Hematological: Negative.   Psychiatric/Behavioral: Positive for behavioral problems (he is concerned that someone is poisoning him or watching him), disturbed wake/sleep cycle, dysphoric mood and agitation. Negative for suicidal ideas, hallucinations, confusion, self-injury and decreased concentration. The patient is nervous/anxious and has insomnia. The patient is not hyperactive.        Objective:   Physical Exam  Vitals reviewed. Constitutional: He is oriented to person, place, and time. He appears well-developed and well-nourished. No distress.  HENT:  Head: Normocephalic and atraumatic.  Mouth/Throat: Oropharynx is clear and moist. No oropharyngeal exudate.  Eyes: Conjunctivae are normal. Right eye exhibits no discharge. Left eye exhibits no discharge. No  scleral icterus.  Neck: Normal range of motion. Neck supple. No JVD present. No tracheal deviation present. No thyromegaly present.  Cardiovascular: Normal rate, regular rhythm, normal heart sounds and intact distal pulses.  Exam reveals no gallop and no friction rub.   No murmur heard. Pulmonary/Chest: Effort normal and breath sounds normal. No stridor. No respiratory distress. He has no wheezes. He has no rales. He exhibits no tenderness.  Abdominal: Soft. Bowel sounds are normal. He exhibits no distension and no mass. There is no tenderness. There is no rebound and no guarding.  Musculoskeletal: Normal range of motion. He exhibits no edema and no tenderness.  Lymphadenopathy:    He has no cervical adenopathy.  Neurological: He is oriented to person, place, and time.  Skin: Skin is warm and dry. No rash noted. He is not diaphoretic. No erythema. No pallor.  Psychiatric: His behavior is normal. Judgment normal. His mood appears anxious. His affect is not angry, not blunt, not labile and not inappropriate. His speech is not rapid and/or pressured, not delayed, not tangential and not slurred. He is not agitated, not aggressive, is not hyperactive, not slowed, not withdrawn, not actively hallucinating and not combative. Thought content is paranoid. Thought content is not delusional. Cognition and memory are normal. Cognition and memory are not impaired. He does not express impulsivity or inappropriate judgment. He exhibits a depressed mood. He expresses no homicidal and no suicidal ideation. He expresses no suicidal plans and no homicidal plans. He is communicative. He exhibits normal recent memory and normal remote memory. He is attentive.      Lab Results  Component Value Date   WBC 10.0 09/29/2011   HGB 16.8 09/29/2011   HCT 49.2 09/29/2011   PLT 134.0* 09/29/2011   GLUCOSE 107* 12/05/2011   CHOL 189 12/05/2011   TRIG 168.0* 12/05/2011  HDL 35.30* 12/05/2011   LDLDIRECT 71.2 09/29/2011   LDLCALC 120*  12/05/2011   ALT 22 12/05/2011   AST 19 12/05/2011   NA 138 12/05/2011   K 4.5 12/05/2011   CL 108 12/05/2011   CREATININE 0.8 12/05/2011   BUN 16 12/05/2011   CO2 27 12/05/2011   TSH 1.16 03/15/2011   PSA 0.58 03/15/2011   INR 1.0 12/19/2008   HGBA1C 6.1 12/05/2011   MICROALBUR 0.2 10/08/2008      Assessment & Plan:

## 2012-03-28 NOTE — Telephone Encounter (Signed)
Pt calling and states that he was seen in the office today 03/28/12 at 0900 and went to pharmacy to get Xanax as ordered today.  Pharmacy stated that MD will need to get an "over ride" from insurance company in order to fill this Rx. Pt is wanting to get this Xanax Rx filled today. OFFICE PLEASE REVIEW AND CALL PT BACK REGARDING XANAX.

## 2012-03-28 NOTE — Assessment & Plan Note (Signed)
His BP is well controlled 

## 2012-03-28 NOTE — Assessment & Plan Note (Signed)
Continue current meds as needed 

## 2012-03-28 NOTE — Patient Instructions (Signed)
Schizophrenia  Schizophrenia is a serious mental illness. There is disturbed and disorganized thinking, language, and behavior. Patients may see, hear, or feel things that are not really there. Sometimes speech is incoherent and there are multiple problems with day to day living. Schizophrenia should not be confused with multiple personality disorder (now called dissociative identity disorder) in which a person has at least two distinct personalities. About 1% of people have schizophrenia in their lifetimes. It affects men and women equally.  CAUSES   There are many theories about the cause of schizophrenia. The genes a person inherits from his or her parents may be partially responsible. Stress in a person's environment can trigger episodes. A person may have functioned normally for years and then have an acute (sudden) psychotic episode caused by stress. Some scientists believe that something might happen before birth, such as a viral infection in the womb, that causes schizophrenic symptoms (problems) decades later. Special scans, such as PET (positron-emission tomography) have been used to look at the brains of people with this illness. These pictures show that some parts of the brain seem to have metabolic or chemical abnormalities. Lab studies have shown that nerve cells in some parts of the brains of schizophrenics may be uneven or damaged. Another possible cause is that chemicals carrying signals between nerve cells may not be working. Schizophrenia does not appear to be caused by family problems. Stress does appear to make things worse for people with this illness.  SYMPTOMS  No single symptom defines this condition. Important signs are:   Hallucinations (hearing voices or seeing things that are not there to hear or see).   Dressing inappropriately.   Neglecting personal hygiene and grooming.   Withdrawing from social contacts and not speaking to anybody (autism).   Inability to understand what a  schizophrenic is saying.   A growing distrust of people without good cause.   Being very bland or blunted emotionally (flat affect).  TYPES OF SCHIZOPHRENIA   Paranoid schizophrenia involves delusional thoughts. The patient believes people around them are against them and plotting against them.   In grandiose schizophrenia the patient may feel that he is God, or the President, etc.   Disorganized schizophrenia involves symptoms of disorganized speech.  TREATMENT   Medications are the most important part of the treatment of schizophrenia. Many medications are available that can relieve symptoms. It is often helpful if these can be administered by a more trusted family member because the patient may sometimes think they are being poisoned. It is important that the medications be given regularly even when the patient seems to be doing well. Do not stop giving medications without instruction by a caregiver. This could lead to a relapse. Hospitalization may sometimes be necessary if symptoms cannot be controlled with medications.  Schizophrenia may be lifelong. However, periods of illness may be inter spaced with long periods of normality. Medications can greatly improve the quality of life. Non prescribed drugs and alcohol should be avoided.   Assistance is available for care. The National Alliance for the Mentally Ill is an organization of family members of people with severe mental illness. They direct families and patients to support groups, education, and advocacy programs for additional help.  NAMI's (National [employer] for the Mentally Ill) toll-free help line number is 800/950-NAMI, or 800/950-6264.  Document Released: 09/09/2000 Document Revised: 09/01/2011 Document Reviewed: 09/12/2005  ExitCare Patient Information 2012 [employer].

## 2012-03-28 NOTE — Telephone Encounter (Signed)
Pt contacted and states that he needed refills of Nexium, Spiriva, Livalo, and Celebrex. Pt received Rxs for controlled medications in office.

## 2012-03-30 ENCOUNTER — Ambulatory Visit: Payer: Self-pay | Admitting: Internal Medicine

## 2012-04-04 ENCOUNTER — Ambulatory Visit (HOSPITAL_COMMUNITY): Payer: Self-pay | Admitting: Licensed Clinical Social Worker

## 2012-05-04 ENCOUNTER — Telehealth: Payer: Self-pay

## 2012-05-04 NOTE — Telephone Encounter (Signed)
Received pharmacy rejection stating that insurance will not cover livalo 2 mg without a prior authorization. Called insurance at [phone] (id# [id number]), form will be faxed to our office for completeion.

## 2012-05-07 NOTE — Telephone Encounter (Signed)
Livalo 2mg  approved 05/07/12- 05/08/15, pharmacy notified

## 2012-05-09 ENCOUNTER — Telehealth: Payer: Self-pay | Admitting: Internal Medicine

## 2012-05-09 ENCOUNTER — Ambulatory Visit: Payer: Self-pay | Admitting: Internal Medicine

## 2012-05-09 MED ORDER — TRAZODONE HCL 100 MG PO TABS: ORAL_TABLET | ORAL | Status: DC

## 2012-05-09 NOTE — Telephone Encounter (Signed)
Refill sent e-script and approved x 1 yr per MD

## 2012-05-09 NOTE — Telephone Encounter (Signed)
Caller: [name]/Patient; Phone: [phone]; Reason for Call: Caller: [name]/Patient; Patient Name: [name]; PCP: Sanda Linger; Best Callback Phone Number: [phone]  Patient calling.  States he requested a refill for Trazadone on 05/04/12.  States refill order has not been received by Pharmacy.  Patient states he has been out of his Trazadone since 05/06/12.   [facility] Electronic Health Record Reviewed.  No documentation noted regarding refill request for Trazadone.  RN also verified with [facility] on [address] that refill order was not received.   PATIENT REQUESTING PRESCRIPTION REFILL FOR TRAZADONE 100MG .  ; 3 TABLETS AT BEDTIME.  STATES HE RECEIVES ONE MONTH SUPPLY.  PATIENT REQUESTING REFILL TO BE CALLED INTO [facility] ON [address], [place] AT [phone].  PLEASE RETURN CALL TO PATIENT AT [phone] TO INFORM HIM IF PRESCRIPTION HAS BEEN CALLED INTO PHARMACY.

## 2012-05-15 LAB — HM DIABETES EYE EXAM

## 2012-05-27 ENCOUNTER — Other Ambulatory Visit: Payer: Self-pay | Admitting: Internal Medicine

## 2012-06-04 ENCOUNTER — Encounter: Payer: Self-pay | Admitting: Internal Medicine

## 2012-06-04 ENCOUNTER — Ambulatory Visit (INDEPENDENT_AMBULATORY_CARE_PROVIDER_SITE_OTHER): Payer: 59 | Admitting: Internal Medicine

## 2012-06-04 ENCOUNTER — Other Ambulatory Visit (INDEPENDENT_AMBULATORY_CARE_PROVIDER_SITE_OTHER): Payer: 59

## 2012-06-04 VITALS — BP 130/88 | HR 84 | Temp 98.9°F | Resp 16 | Wt 218.0 lb

## 2012-06-04 DIAGNOSIS — N401 Enlarged prostate with lower urinary tract symptoms: Secondary | ICD-10-CM

## 2012-06-04 DIAGNOSIS — N138 Other obstructive and reflux uropathy: Secondary | ICD-10-CM

## 2012-06-04 DIAGNOSIS — Z0000 Encounter for general adult medical examination without abnormal findings: Secondary | ICD-10-CM | POA: Insufficient documentation

## 2012-06-04 DIAGNOSIS — I10 Essential (primary) hypertension: Secondary | ICD-10-CM

## 2012-06-04 DIAGNOSIS — Z23 Encounter for immunization: Secondary | ICD-10-CM

## 2012-06-04 DIAGNOSIS — E118 Type 2 diabetes mellitus with unspecified complications: Secondary | ICD-10-CM

## 2012-06-04 DIAGNOSIS — D696 Thrombocytopenia, unspecified: Secondary | ICD-10-CM

## 2012-06-04 DIAGNOSIS — E782 Mixed hyperlipidemia: Secondary | ICD-10-CM

## 2012-06-04 LAB — COMPREHENSIVE METABOLIC PANEL
ALT: 44 U/L (ref 0–53)
AST: 32 U/L (ref 0–37)
BUN: 9 mg/dL (ref 6–23)
Calcium: 9.1 mg/dL (ref 8.4–10.5)
Chloride: 105 mEq/L (ref 96–112)
Creatinine, Ser: 0.7 mg/dL (ref 0.4–1.5)
GFR: 152.09 mL/min (ref >60.00)
Total Bilirubin: 0.7 mg/dL (ref 0.3–1.2)

## 2012-06-04 LAB — URINALYSIS, ROUTINE W REFLEX MICROSCOPIC
Bilirubin Urine: NEGATIVE
Hgb urine dipstick: NEGATIVE
Leukocytes, UA: NEGATIVE
Nitrite: NEGATIVE
Urobilinogen, UA: 0.2 (ref 0.0–1.0)
pH: 5.5 (ref 5.0–8.0)

## 2012-06-04 LAB — LIPID PANEL
Cholesterol: 194 mg/dL (ref 0–200)
Triglycerides: 137 mg/dL (ref 0.0–149.0)

## 2012-06-04 LAB — CBC WITH DIFFERENTIAL/PLATELET
Basophils Relative: 0.3 % (ref 0.0–3.0)
HCT: 52 % (ref 39.0–52.0)
Hemoglobin: 17.5 g/dL — ABNORMAL HIGH (ref 13.0–17.0)
Lymphocytes Relative: 23.6 % (ref 12.0–46.0)
Lymphs Abs: 2 10*3/uL (ref 0.7–4.0)
Monocytes Relative: 8.9 % (ref 3.0–12.0)
Neutro Abs: 5.6 10*3/uL (ref 1.4–7.7)
RBC: 5.42 Mil/uL (ref 4.22–5.81)

## 2012-06-04 LAB — HM DIABETES FOOT EXAM: HM Diabetic Foot Exam: NORMAL

## 2012-06-04 LAB — HEMOGLOBIN A1C: Hgb A1c MFr Bld: 5.9 % (ref 4.6–6.5)

## 2012-06-04 LAB — PSA: PSA: 0.6 ng/mL (ref 0.10–4.00)

## 2012-06-04 LAB — TSH: TSH: 0.88 u[IU]/mL (ref 0.35–5.50)

## 2012-06-04 NOTE — Assessment & Plan Note (Signed)
PSA today

## 2012-06-04 NOTE — Assessment & Plan Note (Signed)
I will check his a1c today 

## 2012-06-04 NOTE — Progress Notes (Signed)
  Subjective:    Patient ID: [name], male    DOB: 1960-10-20, 51 y.o.   MRN: [id number]  HPI  He returns for a physical and he tells me that he feels well today.  Review of Systems  Constitutional: Negative.   HENT: Negative.   Eyes: Negative.   Respiratory: Negative.   Cardiovascular: Negative.   Gastrointestinal: Negative.   Genitourinary: Negative.   Musculoskeletal: Negative.   Skin: Negative.   Neurological: Negative.   Hematological: Negative.   Psychiatric/Behavioral: Negative.        Objective:   Physical Exam  Vitals reviewed. Constitutional: He is oriented to person, place, and time. He appears well-developed and well-nourished. No distress.  HENT:  Head: Normocephalic and atraumatic.  Mouth/Throat: Oropharynx is clear and moist. No oropharyngeal exudate.  Eyes: Conjunctivae are normal. Right eye exhibits no discharge. Left eye exhibits no discharge. No scleral icterus.  Neck: Normal range of motion. Neck supple. No JVD present. No tracheal deviation present. No thyromegaly present.  Cardiovascular: Normal rate, regular rhythm, normal heart sounds and intact distal pulses.  Exam reveals no gallop and no friction rub.   No murmur heard. Pulmonary/Chest: Effort normal and breath sounds normal. No stridor. No respiratory distress. He has no wheezes. He has no rales. He exhibits no tenderness.  Abdominal: Soft. Bowel sounds are normal. He exhibits no distension. There is no tenderness. There is no rebound and no guarding. Hernia confirmed negative in the right inguinal area and confirmed negative in the left inguinal area.  Genitourinary: Rectum normal, prostate normal, testes normal and penis normal. Rectal exam shows no external hemorrhoid, no internal hemorrhoid, no fissure, no mass, no tenderness and anal tone normal. Guaiac negative stool. Prostate is not enlarged and not tender. Right testis shows no mass, no swelling and no tenderness. 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 Left testis shows no mass, no swelling and no tenderness. Left testis is descended. Uncircumcised. No phimosis, paraphimosis, hypospadias, penile erythema or penile tenderness. No discharge found.  Musculoskeletal: Normal range of motion. He exhibits no edema and no tenderness.  Lymphadenopathy:    He has no cervical adenopathy.       Right: No inguinal adenopathy present.       Left: No inguinal adenopathy present.  Neurological: He is oriented to person, place, and time.  Skin: Skin is warm and dry. No rash noted. He is not diaphoretic. No erythema. No pallor.  Psychiatric: He has a normal mood and affect. His behavior is normal. Judgment and thought content normal.      Lab Results  Component Value Date   WBC 10.0 09/29/2011   HGB 16.8 09/29/2011   HCT 49.2 09/29/2011   PLT 134.0* 09/29/2011   GLUCOSE 107* 12/05/2011   CHOL 189 12/05/2011   TRIG 168.0* 12/05/2011   HDL 35.30* 12/05/2011   LDLDIRECT 71.2 09/29/2011   LDLCALC 120* 12/05/2011   ALT 22 12/05/2011   AST 19 12/05/2011   NA 138 12/05/2011   K 4.5 12/05/2011   CL 108 12/05/2011   CREATININE 0.8 12/05/2011   BUN 16 12/05/2011   CO2 27 12/05/2011   TSH 1.16 03/15/2011   PSA 0.58 03/15/2011   INR 1.0 12/19/2008   HGBA1C 6.1 12/05/2011   MICROALBUR 0.2 10/08/2008      Assessment & Plan:

## 2012-06-04 NOTE — Assessment & Plan Note (Signed)
He is doing well on livalo 

## 2012-06-04 NOTE — Assessment & Plan Note (Addendum)
The patient is here for annual Medicare wellness examination and management of other chronic and acute problems.   The risk factors are reflected in the social history.  The roster of all physicians providing medical care to patient - is listed in the Snapshot section of the chart.  Activities of daily living:  The patient is 100% inedpendent in all ADLs: dressing, toileting, feeding as well as independent mobility  Home safety : The patient has smoke detectors in the home. They wear seatbelts.No firearms at home ( firearms are present in the home, kept in a safe fashion). There is no violence in the home.   There is no risks for hepatitis, STDs or HIV. There is no   history of blood transfusion. They have no travel history to infectious disease endemic areas of the world.  The patient has (has not) seen their dentist in the last six month. They have (not) seen their eye doctor in the last year. They deny (admit to) any hearing difficulty and have not had audiologic testing in the last year.  They do not  have excessive sun exposure. Discussed the need for sun protection: hats, long sleeves and use of sunscreen if there is significant sun exposure.   Diet: the importance of a healthy diet is discussed. They do have a healthy (unhealthy-high fat/fast food) diet.  The patient has a regular exercise program.  Depression screen: there are no signs or vegative symptoms of depression- irritability, change in appetite, anhedonia, sadness/tearfullness.  Cognitive assessment: the patient manages all their financial and personal affairs and is actively engaged. They could relate day,date,year and events; recalled 3/3 objects at 3 minutes; performed clock-face test normally.  The following portions of the patient's history were reviewed and updated as appropriate: allergies, current medications, past family history, past medical history,  past surgical history, past social history  and problem  list.  Vision, hearing, body mass index were assessed and reviewed.   During the course of the visit the patient was educated and counseled about appropriate screening and preventive services including : fall prevention , diabetes screening, nutrition counseling, colorectal cancer screening, and recommended immunizations.  

## 2012-06-04 NOTE — Assessment & Plan Note (Signed)
CBC and HIV test today

## 2012-06-04 NOTE — Assessment & Plan Note (Signed)
His BP is well controlled, I will check his lytes and renal function toady

## 2012-06-04 NOTE — Patient Instructions (Signed)
Health Maintenance, Males A healthy lifestyle and preventative care can promote health and wellness.  Maintain regular health, dental, and eye exams.   Eat a healthy diet. Foods like vegetables, fruits, whole grains, low-fat dairy products, and lean protein foods contain the nutrients you need without too many calories. Decrease your intake of foods high in solid fats, added sugars, and salt. Get information about a proper diet from your caregiver, if necessary.   Regular physical exercise is one of the most important things you can do for your health. Most adults should get at least 150 minutes of moderate-intensity exercise (any activity that increases your heart rate and causes you to sweat) each week. In addition, most adults need muscle-strengthening exercises on 2 or more days a week.    Maintain a healthy weight. The body mass index (BMI) is a screening tool to identify possible weight problems. It provides an estimate of body fat based on height and weight. Your caregiver can help determine your BMI, and can help you achieve or maintain a healthy weight. For adults 20 years and older:   A BMI below 18.5 is considered underweight.   A BMI of 18.5 to 24.9 is normal.   A BMI of 25 to 29.9 is considered overweight.   A BMI of 30 and above is considered obese.   Maintain normal blood lipids and cholesterol by exercising and minimizing your intake of saturated fat. Eat a balanced diet with plenty of fruits and vegetables. Blood tests for lipids and cholesterol should begin at age 20 and be repeated every 5 years. If your lipid or cholesterol levels are high, you are over 50, or you are a high risk for heart disease, you may need your cholesterol levels checked more frequently.Ongoing high lipid and cholesterol levels should be treated with medicines, if diet and exercise are not effective.   If you smoke, find out from your caregiver how to quit. If you do not use tobacco, do not start.    If you choose to drink alcohol, do not exceed 2 drinks per day. One drink is considered to be 12 ounces (355 mL) of beer, 5 ounces (148 mL) of wine, or 1.5 ounces (44 mL) of liquor.   Avoid use of street drugs. Do not share needles with anyone. Ask for help if you need support or instructions about stopping the use of drugs.   High blood pressure causes heart disease and increases the risk of stroke. Blood pressure should be checked at least every 1 to 2 years. Ongoing high blood pressure should be treated with medicines if weight loss and exercise are not effective.   If you are 45 to 52 years old, ask your caregiver if you should take aspirin to prevent heart disease.   Diabetes screening involves taking a blood sample to check your fasting blood sugar level. This should be done once every 3 years, after age 45, if you are within normal weight and without risk factors for diabetes. Testing should be considered at a younger age or be carried out more frequently if you are overweight and have at least 1 risk factor for diabetes.   Colorectal cancer can be detected and often prevented. Most routine colorectal cancer screening begins at the age of 50 and continues through age 75. However, your caregiver may recommend screening at an earlier age if you have risk factors for colon cancer. On a yearly basis, your caregiver may provide home test kits to check for hidden   blood in the stool. Use of a small camera at the end of a tube, to directly examine the colon (sigmoidoscopy or colonoscopy), can detect the earliest forms of colorectal cancer. Talk to your caregiver about this at age 50, when routine screening begins. Direct examination of the colon should be repeated every 5 to 10 years through age 75, unless early forms of pre-cancerous polyps or small growths are found.   Hepatitis C blood testing is recommended for all people born from 1945 through 1965 and any individual with known risks for  hepatitis C.   Healthy men should no longer receive prostate-specific antigen (PSA) blood tests as part of routine cancer screening. Consult with your caregiver about prostate cancer screening.   Testicular cancer screening is not recommended for adolescents or adult males who have no symptoms. Screening includes self-exam, caregiver exam, and other screening tests. Consult with your caregiver about any symptoms you have or any concerns you have about testicular cancer.   Practice safe sex. Use condoms and avoid high-risk sexual practices to reduce the spread of sexually transmitted infections (STIs).   Use sunscreen with a sun protection factor (SPF) of 30 or greater. Apply sunscreen liberally and repeatedly throughout the day. You should seek shade when your shadow is shorter than you. Protect yourself by wearing long sleeves, pants, a wide-brimmed hat, and sunglasses year round, whenever you are outdoors.   Notify your caregiver of new moles or changes in moles, especially if there is a change in shape or color. Also notify your caregiver if a mole is larger than the size of a pencil eraser.   A one-time screening for abdominal aortic aneurysm (AAA) and surgical repair of large AAAs by sound wave imaging (ultrasonography) is recommended for ages 65 to 75 years who are current or former smokers.   Stay current with your immunizations.  Document Released: 03/10/2008 Document Revised: 09/01/2011 Document Reviewed: 02/07/2011 [employer] Patient Information 2012 [employer]. 

## 2012-06-18 ENCOUNTER — Telehealth: Payer: Self-pay | Admitting: Internal Medicine

## 2012-06-18 ENCOUNTER — Encounter: Payer: Self-pay | Admitting: Internal Medicine

## 2012-06-18 MED ORDER — ALBUTEROL SULFATE HFA 108 (90 BASE) MCG/ACT IN AERS: 2.0000 | INHALATION_SPRAY | Freq: Four times a day (QID) | RESPIRATORY_TRACT | Status: DC | PRN

## 2012-06-18 NOTE — Telephone Encounter (Signed)
Spoke with pt advised him that he would need to keep his appt  On 07-05-12 With MR for further refills on the pro air rx sent to pharmacy with 1 refill pt understood and nothing further was needed.

## 2012-06-27 ENCOUNTER — Other Ambulatory Visit: Payer: Self-pay | Admitting: Internal Medicine

## 2012-07-05 ENCOUNTER — Ambulatory Visit: Payer: Self-pay | Admitting: Internal Medicine

## 2012-07-25 ENCOUNTER — Other Ambulatory Visit: Payer: Self-pay

## 2012-07-25 DIAGNOSIS — F418 Other specified anxiety disorders: Secondary | ICD-10-CM

## 2012-07-25 DIAGNOSIS — F200 Paranoid schizophrenia: Secondary | ICD-10-CM

## 2012-07-25 MED ORDER — QUETIAPINE FUMARATE ER 50 MG PO TB24: 50.0000 mg | ORAL_TABLET | Freq: Every day | ORAL | Status: DC

## 2012-08-08 ENCOUNTER — Telehealth: Payer: Self-pay | Admitting: Internal Medicine

## 2012-08-08 NOTE — Telephone Encounter (Signed)
Caller: [place]/Patient; Patient Name: [name]; PCP: Sanda Linger (Adults only); Best Callback Phone Number: [phone]. Patient reports that a request for refill of diabetic testing strips and breathing treatments was faxed over to the office on 08/07/12. Patient wants to make sure we have received these requests. [facility] for testing strips/diabetic supplies & [facility] for the breathing treatment medications will be sending fax requests for these. Verified fax number for patient. PLEASE CALL PATIENT BACK IF THERE ARE ANY QUESTIONS.

## 2012-08-08 NOTE — Telephone Encounter (Signed)
Nothing received as of yet.

## 2012-08-08 NOTE — Telephone Encounter (Signed)
Patient calling in follow up regarding testing strips.  States contacted office AM 08/08/12 to notify that his mail order supply company had faxed request to office 08/07/12.  Per Epic note, fax had not been received by 1300 08/08/12; verified fax number with patient.  States will have supply company call the office for assistance.  krs/can

## 2012-08-13 NOTE — Telephone Encounter (Signed)
Patient calling to see if mail order supply company had faxed patients' refill request. Per EPIC nothing entered. Pt. Is requesting that Diabetic testing supplies (strips and lancets), Proventil nebulizer; Tiotropium and Pro Air rescue inhaler be called to [facility] [phone] .  He has approximately 1 weeks worth of supplies/meds.

## 2012-08-14 NOTE — Telephone Encounter (Signed)
Patient notified

## 2012-08-22 ENCOUNTER — Telehealth: Payer: Self-pay | Admitting: Internal Medicine

## 2012-08-22 ENCOUNTER — Other Ambulatory Visit: Payer: Self-pay

## 2012-08-22 DIAGNOSIS — M179 Osteoarthritis of knee, unspecified: Secondary | ICD-10-CM

## 2012-08-22 DIAGNOSIS — F418 Other specified anxiety disorders: Secondary | ICD-10-CM

## 2012-08-22 DIAGNOSIS — E118 Type 2 diabetes mellitus with unspecified complications: Secondary | ICD-10-CM

## 2012-08-22 DIAGNOSIS — J449 Chronic obstructive pulmonary disease, unspecified: Secondary | ICD-10-CM

## 2012-08-22 DIAGNOSIS — E782 Mixed hyperlipidemia: Secondary | ICD-10-CM

## 2012-08-22 DIAGNOSIS — M1710 Unilateral primary osteoarthritis, unspecified knee: Secondary | ICD-10-CM

## 2012-08-22 MED ORDER — TIOTROPIUM BROMIDE MONOHYDRATE 18 MCG IN CAPS: 18.0000 ug | ORAL_CAPSULE | Freq: Every day | RESPIRATORY_TRACT | Status: DC

## 2012-08-22 MED ORDER — ALPRAZOLAM 1 MG PO TABS: ORAL_TABLET | ORAL | Status: DC

## 2012-08-22 MED ORDER — ALBUTEROL SULFATE HFA 108 (90 BASE) MCG/ACT IN AERS: 2.0000 | INHALATION_SPRAY | Freq: Four times a day (QID) | RESPIRATORY_TRACT | Status: DC | PRN

## 2012-08-22 MED ORDER — PAROXETINE HCL 30 MG PO TABS: 30.0000 mg | ORAL_TABLET | ORAL | Status: DC

## 2012-08-22 MED ORDER — CELECOXIB 200 MG PO CAPS: 200.0000 mg | ORAL_CAPSULE | Freq: Every day | ORAL | Status: DC

## 2012-08-22 MED ORDER — ESOMEPRAZOLE MAGNESIUM 40 MG PO CPDR: 40.0000 mg | DELAYED_RELEASE_CAPSULE | Freq: Every day | ORAL | Status: DC

## 2012-08-22 MED ORDER — PITAVASTATIN CALCIUM 2 MG PO TABS: 1.0000 | ORAL_TABLET | Freq: Every day | ORAL | Status: DC

## 2012-08-22 MED ORDER — TRAZODONE HCL 100 MG PO TABS: ORAL_TABLET | ORAL | Status: DC

## 2012-08-22 NOTE — Telephone Encounter (Signed)
Patient called states he has moved and changing pharmacy. Please advise on xanx

## 2012-08-22 NOTE — Telephone Encounter (Signed)
Pt called triager and left call back number [phone].  Triager unable to reach and left message on voice mail for pt to call office back if needed.

## 2012-09-11 ENCOUNTER — Telehealth: Payer: Self-pay | Admitting: Internal Medicine

## 2012-09-11 DIAGNOSIS — J449 Chronic obstructive pulmonary disease, unspecified: Secondary | ICD-10-CM

## 2012-09-11 MED ORDER — CARVEDILOL 12.5 MG PO TABS: 12.5000 mg | ORAL_TABLET | Freq: Two times a day (BID) | ORAL | Status: DC

## 2012-09-11 NOTE — Telephone Encounter (Signed)
RX sent to [facility] per pt request

## 2012-09-11 NOTE — Telephone Encounter (Signed)
Patient Information:  Caller Name: Thelbert  Phone: [phone]  Patient: [name], [name]  Gender: Male  DOB: 1961/07/19  Age: 51 Years  PCP: Sanda Linger (Adults only)  Office Follow Up:  Does the office need to follow up with this patient?: Yes  Instructions For The Office: Needs a refill on blood pressure medication.   Symptoms  Reason For Call & Symptoms: Pt is calling for a refill on his blood pressure medication. He lost his meds a week ago. BP 164/103. Appt declined  Reviewed Health History In EMR: Yes  Reviewed Medications In EMR: Yes  Reviewed Allergies In EMR: Yes  Reviewed Surgeries / Procedures: Yes  Date of Onset of Symptoms: 09/09/2012  Guideline(s) Used:  High Blood Pressure  Disposition Per Guideline:   Discuss with PCP and Callback by Nurse Today  Reason For Disposition Reached:   Ran out of BP medications  Advice Given:  Call Back If:  You become worse.

## 2012-09-21 IMAGING — CR DG CHEST 2V
2 series · 2 of 2 positions shown · non-contrast
Comparison: 11/15/2010

CLINICAL DATA: Chest pain

CHEST - 2 VIEW

[w chest pa]
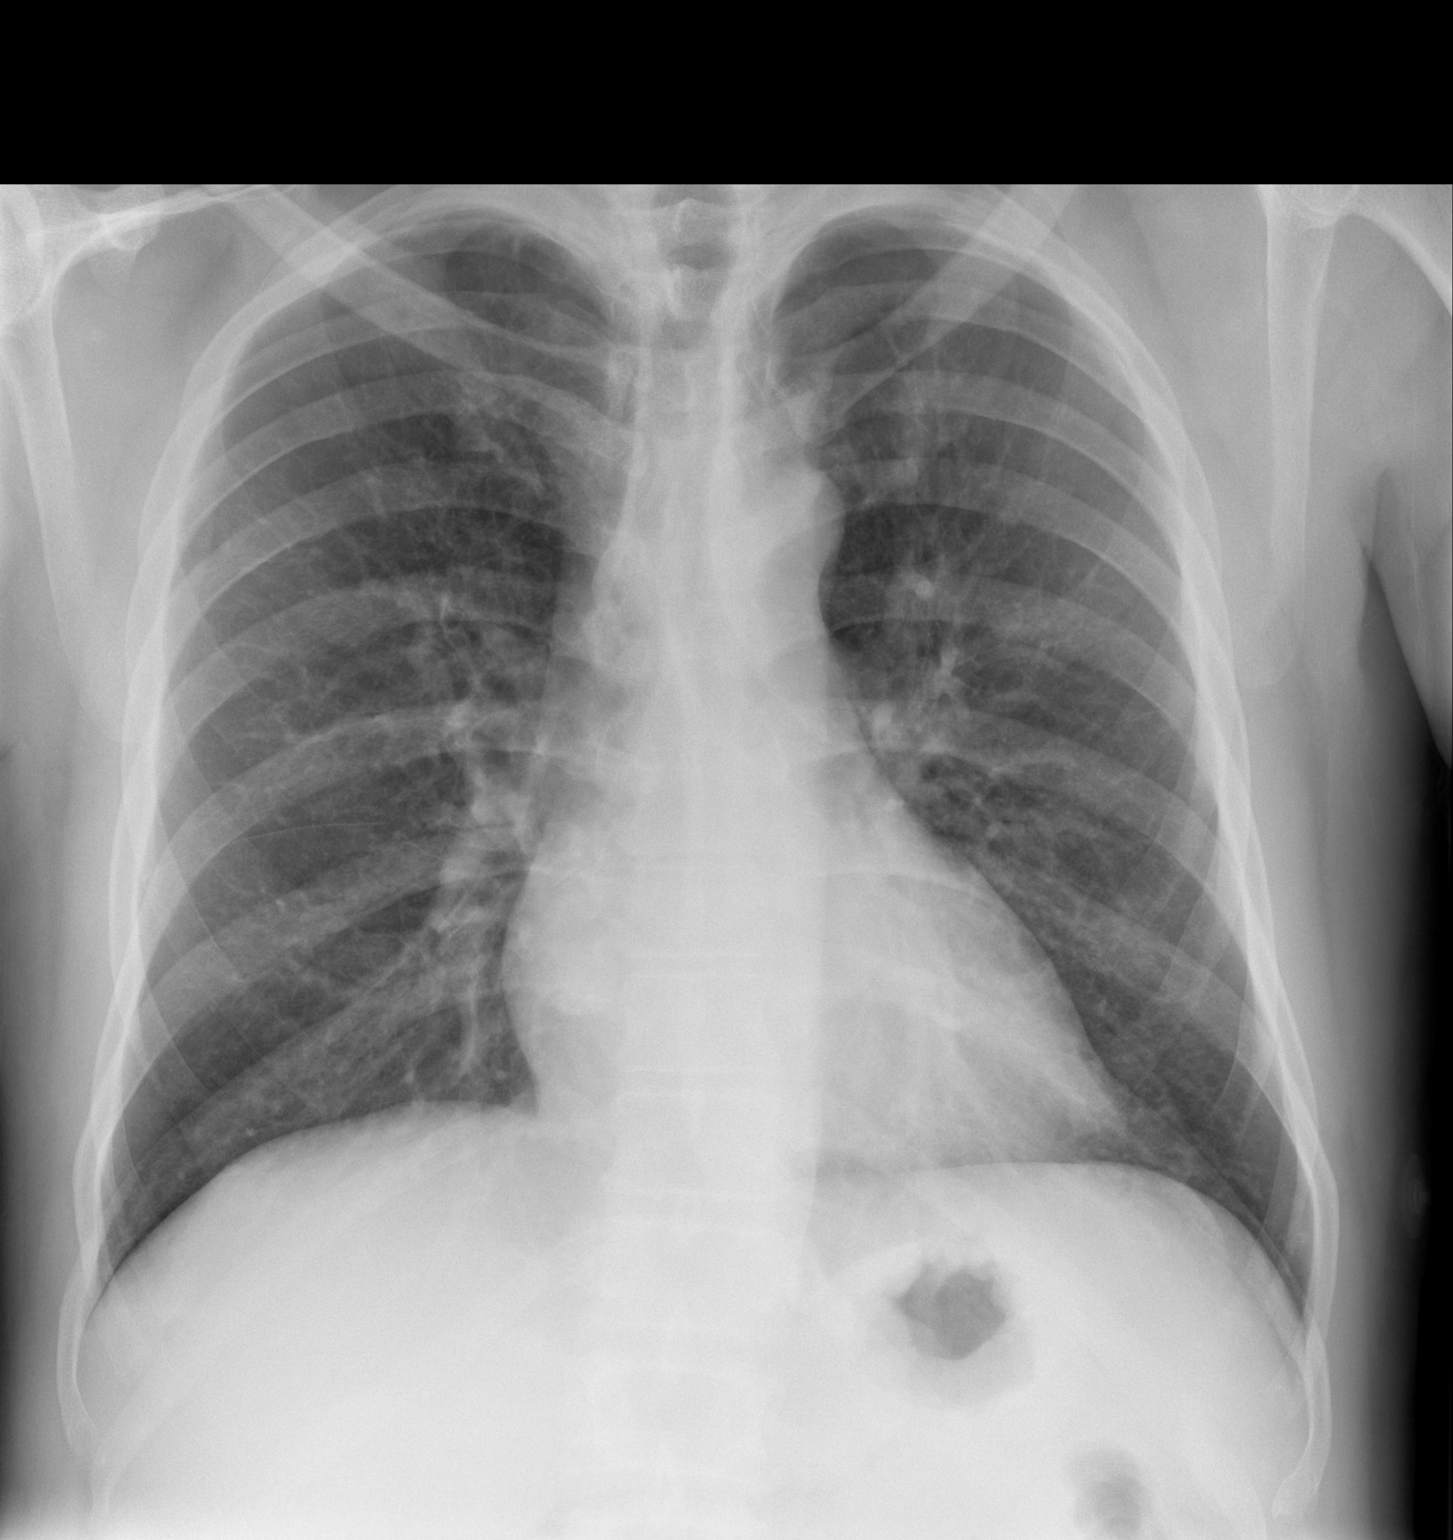

[w chest lat]
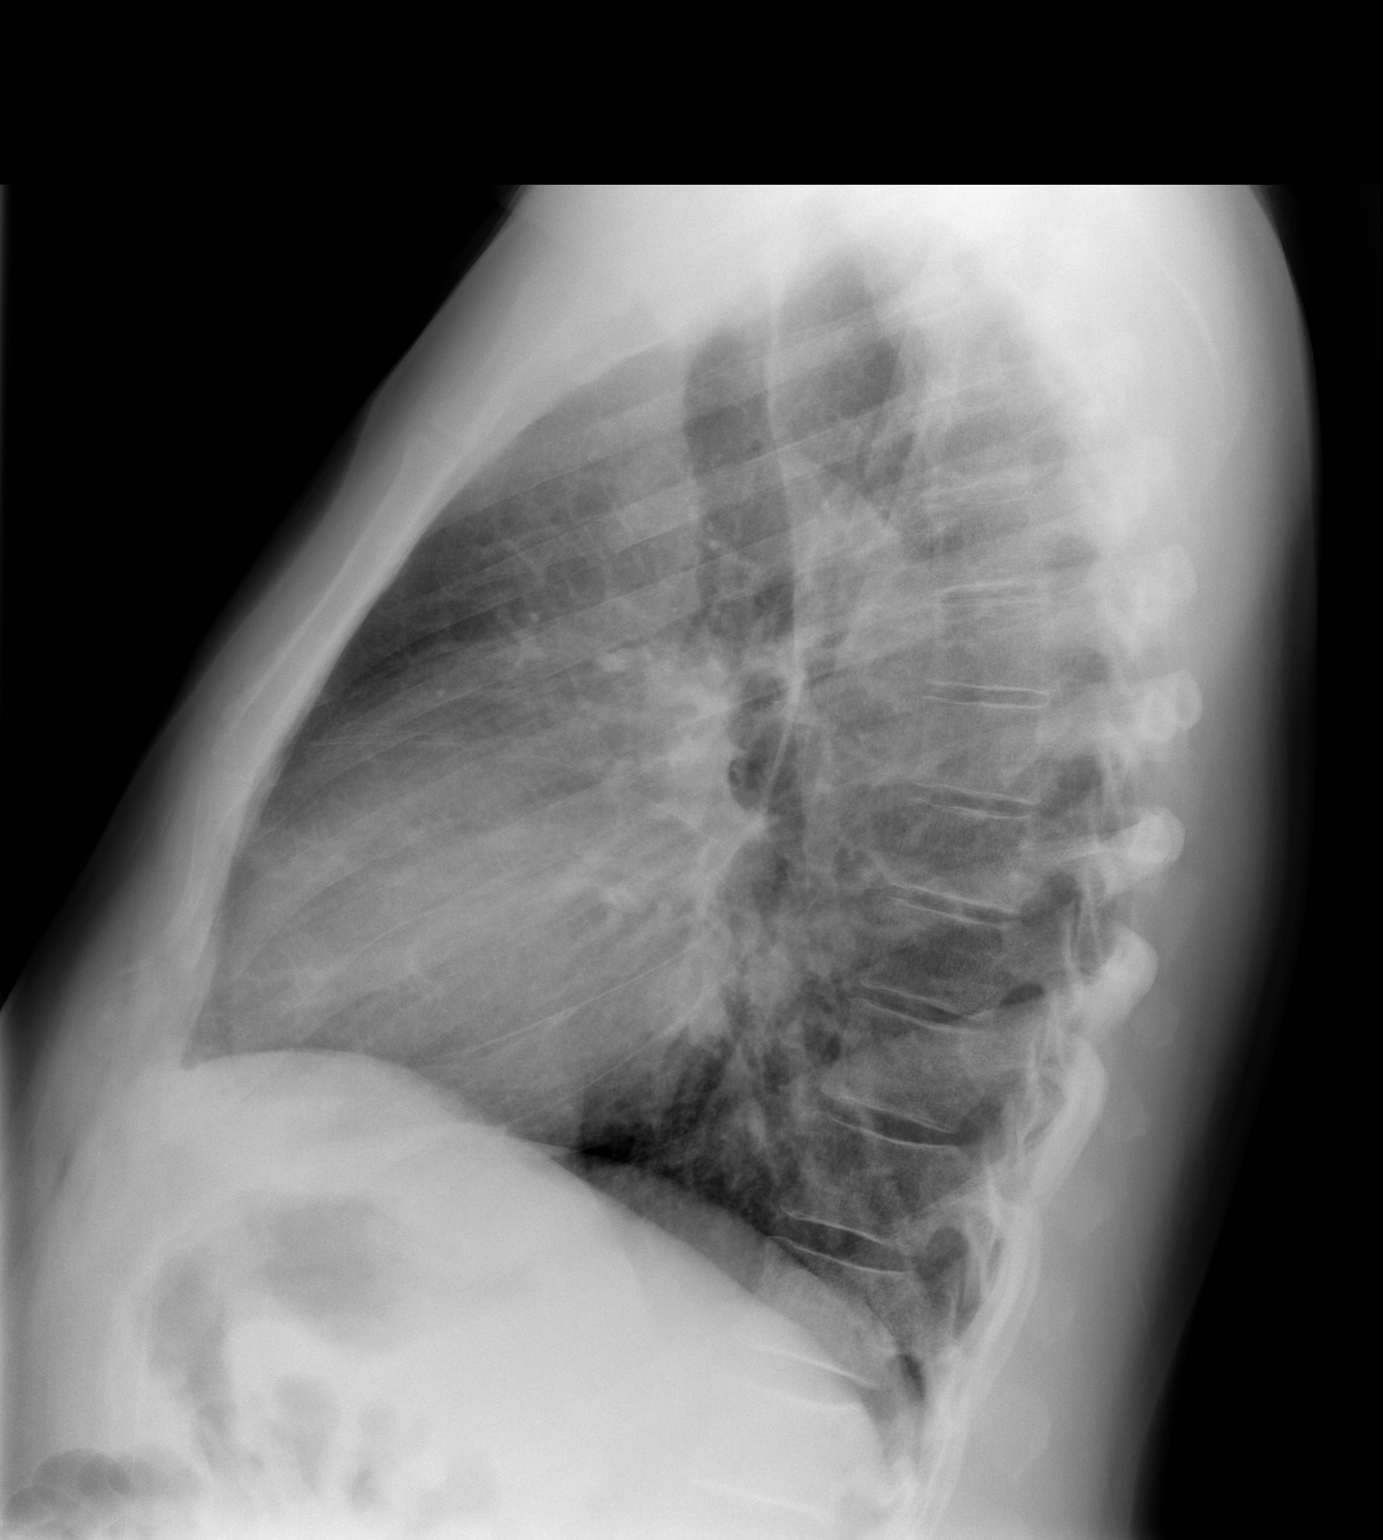

[2 of 2 positions shown; findings below may reference images not displayed]

FINDINGS: The heart size and mediastinal contours are within
normal limits.  Both lungs are clear.  The visualized skeletal
structures are unremarkable.
IMPRESSION: No active cardiopulmonary disease.

## 2012-10-04 ENCOUNTER — Ambulatory Visit (INDEPENDENT_AMBULATORY_CARE_PROVIDER_SITE_OTHER)
Admission: RE | Admit: 2012-10-04 | Discharge: 2012-10-04 | Disposition: A | Discharge status: Home / Self Care | Payer: 59 | Source: Ambulatory Visit | Attending: Internal Medicine | Admitting: Internal Medicine

## 2012-10-04 ENCOUNTER — Encounter: Payer: Self-pay | Admitting: Internal Medicine

## 2012-10-04 ENCOUNTER — Ambulatory Visit (INDEPENDENT_AMBULATORY_CARE_PROVIDER_SITE_OTHER): Payer: 59 | Admitting: Internal Medicine

## 2012-10-04 ENCOUNTER — Other Ambulatory Visit (INDEPENDENT_AMBULATORY_CARE_PROVIDER_SITE_OTHER): Payer: 59

## 2012-10-04 VITALS — BP 110/68 | HR 107 | Temp 98.7°F | Resp 16 | Wt 211.0 lb

## 2012-10-04 DIAGNOSIS — J449 Chronic obstructive pulmonary disease, unspecified: Secondary | ICD-10-CM

## 2012-10-04 DIAGNOSIS — R079 Chest pain, unspecified: Secondary | ICD-10-CM

## 2012-10-04 DIAGNOSIS — F200 Paranoid schizophrenia: Secondary | ICD-10-CM

## 2012-10-04 DIAGNOSIS — M179 Osteoarthritis of knee, unspecified: Secondary | ICD-10-CM

## 2012-10-04 DIAGNOSIS — E118 Type 2 diabetes mellitus with unspecified complications: Secondary | ICD-10-CM

## 2012-10-04 DIAGNOSIS — F341 Dysthymic disorder: Secondary | ICD-10-CM

## 2012-10-04 DIAGNOSIS — D696 Thrombocytopenia, unspecified: Secondary | ICD-10-CM

## 2012-10-04 DIAGNOSIS — M1710 Unilateral primary osteoarthritis, unspecified knee: Secondary | ICD-10-CM

## 2012-10-04 DIAGNOSIS — F418 Other specified anxiety disorders: Secondary | ICD-10-CM

## 2012-10-04 DIAGNOSIS — I10 Essential (primary) hypertension: Secondary | ICD-10-CM

## 2012-10-04 DIAGNOSIS — I429 Cardiomyopathy, unspecified: Secondary | ICD-10-CM

## 2012-10-04 DIAGNOSIS — E782 Mixed hyperlipidemia: Secondary | ICD-10-CM

## 2012-10-04 DIAGNOSIS — IMO0002 Reserved for concepts with insufficient information to code with codable children: Secondary | ICD-10-CM

## 2012-10-04 LAB — CBC WITH DIFFERENTIAL/PLATELET
Eosinophils Absolute: 0.1 10*3/uL (ref 0.0–0.7)
Eosinophils Relative: 1.2 % (ref 0.0–5.0)
HCT: 51 % (ref 39.0–52.0)
Lymphs Abs: 2.8 10*3/uL (ref 0.7–4.0)
MCHC: 34.4 g/dL (ref 30.0–36.0)
MCV: 93.8 fl (ref 78.0–100.0)
Monocytes Absolute: 0.6 10*3/uL (ref 0.1–1.0)
Platelets: 112 10*3/uL — ABNORMAL LOW (ref 150.0–400.0)
WBC: 7.4 10*3/uL (ref 4.5–10.5)

## 2012-10-04 LAB — COMPREHENSIVE METABOLIC PANEL
Alkaline Phosphatase: 51 U/L (ref 39–117)
BUN: 7 mg/dL (ref 6–23)
Glucose, Bld: 90 mg/dL (ref 70–99)
Sodium: 142 mEq/L (ref 135–145)
Total Bilirubin: 0.6 mg/dL (ref 0.3–1.2)
Total Protein: 6.3 g/dL (ref 6.0–8.3)

## 2012-10-04 LAB — CARDIAC PANEL
Relative Index: 1.4 calc (ref 0.0–2.5)
Total CK: 125 U/L (ref 7–232)

## 2012-10-04 LAB — TROPONIN I: Troponin I: <0.01 ng/mL (ref <0.06)

## 2012-10-04 LAB — HEMOGLOBIN A1C: Hgb A1c MFr Bld: 6 % (ref 4.6–6.5)

## 2012-10-04 MED ORDER — ALPRAZOLAM 1 MG PO TABS: ORAL_TABLET | ORAL | Status: DC

## 2012-10-04 MED ORDER — HYDROCODONE-ACETAMINOPHEN 10-325 MG PO TABS: 1.0000 | ORAL_TABLET | Freq: Three times a day (TID) | ORAL | Status: DC | PRN

## 2012-10-04 NOTE — Assessment & Plan Note (Signed)
Continue current meds for pain 

## 2012-10-04 NOTE — Assessment & Plan Note (Signed)
I have asked him to have a f/up with cardiology 

## 2012-10-04 NOTE — Patient Instructions (Signed)
Chest Pain (Nonspecific) It is often hard to give a specific diagnosis for the cause of chest pain. There is always a chance that your pain could be related to something serious, such as a heart attack or a blood clot in the lungs. You need to follow up with your caregiver for further evaluation. CAUSES   Heartburn.  Pneumonia or bronchitis.  Anxiety or stress.  Inflammation around your heart (pericarditis) or lung (pleuritis or pleurisy).  A blood clot in the lung.  A collapsed lung (pneumothorax). It can develop suddenly on its own (spontaneous pneumothorax) or from injury (trauma) to the chest.  Shingles infection (herpes zoster virus). The chest wall is composed of bones, muscles, and cartilage. Any of these can be the source of the pain.  The bones can be bruised by injury.  The muscles or cartilage can be strained by coughing or overwork.  The cartilage can be affected by inflammation and become sore (costochondritis). DIAGNOSIS  Lab tests or other studies, such as X-rays, electrocardiography, stress testing, or cardiac imaging, may be needed to find the cause of your pain.  TREATMENT   Treatment depends on what may be causing your chest pain. Treatment may include:  Acid blockers for heartburn.  Anti-inflammatory medicine.  Pain medicine for inflammatory conditions.  Antibiotics if an infection is present.  You may be advised to change lifestyle habits. This includes stopping smoking and avoiding alcohol, caffeine, and chocolate.  You may be advised to keep your head raised (elevated) when sleeping. This reduces the chance of acid going backward from your stomach into your esophagus.  Most of the time, nonspecific chest pain will improve within 2 to 3 days with rest and mild pain medicine. HOME CARE INSTRUCTIONS   If antibiotics were prescribed, take your antibiotics as directed. Finish them even if you start to feel better.  For the next few days, avoid physical  activities that bring on chest pain. Continue physical activities as directed.  Do not smoke.  Avoid drinking alcohol.  Only take over-the-counter or prescription medicine for pain, discomfort, or fever as directed by your caregiver.  Follow your caregiver's suggestions for further testing if your chest pain does not go away.  Keep any follow-up appointments you made. If you do not go to an appointment, you could develop lasting (chronic) problems with pain. If there is any problem keeping an appointment, you must call to reschedule. SEEK MEDICAL CARE IF:   You think you are having problems from the medicine you are taking. Read your medicine instructions carefully.  Your chest pain does not go away, even after treatment.  You develop a rash with blisters on your chest. SEEK IMMEDIATE MEDICAL CARE IF:   You have increased chest pain or pain that spreads to your arm, neck, jaw, back, or abdomen.  You develop shortness of breath, an increasing cough, or you are coughing up blood.  You have severe back or abdominal pain, feel nauseous, or vomit.  You develop severe weakness, fainting, or chills.  You have a fever. THIS IS AN EMERGENCY. Do not wait to see if the pain will go away. Get medical help at once. Call your local emergency services (911 in [place] Do not drive yourself to the hospital. MAKE SURE YOU:   Understand these instructions.  Will watch your condition.  Will get help right away if you are not doing well or get worse. Document Released: 06/22/2005 Document Revised: 12/05/2011 Document Reviewed: 04/17/2008 Exit[employer] Patient Information 2013 [employer].  

## 2012-10-04 NOTE — Assessment & Plan Note (Signed)
He is doing well on his current meds

## 2012-10-04 NOTE — Progress Notes (Signed)
Subjective:    Patient ID: [name], male    DOB: 24-Dec-1960, 52 y.o.   MRN: [id number]  Chest Pain  This is a new problem. The current episode started 1 to 4 weeks ago. The onset quality is gradual. The problem occurs intermittently. The problem has been unchanged. Pain location: right upper anterior chest area. The pain is at a severity of 3/10. The pain is mild. The quality of the pain is described as dull (aching). The pain radiates to the upper back. Associated symptoms include back pain (aching in his lower back). Pertinent negatives include no abdominal pain, claudication, cough, diaphoresis, dizziness, exertional chest pressure, fever, headaches, hemoptysis, irregular heartbeat, leg pain, lower extremity edema, malaise/fatigue, nausea, near-syncope, numbness, orthopnea, palpitations, PND, shortness of breath, sputum production, syncope, vomiting or weakness. The pain is aggravated by coughing (and sitting up). He has tried NSAIDs, analgesics and acetaminophen for the symptoms. The treatment provided moderate relief. Risk factors include smoking/tobacco exposure and male gender.  His past medical history is significant for hypertension.  Pertinent negatives for past medical history include no CHF, no diabetes and no seizures.      Review of Systems  Constitutional: Negative for fever, chills, malaise/fatigue, diaphoresis, activity change, appetite change, fatigue and unexpected weight change.  HENT: Negative.   Eyes: Negative.   Respiratory: Negative for apnea, cough, hemoptysis, sputum production, choking, chest tightness, shortness of breath, wheezing and stridor.   Cardiovascular: Positive for chest pain. Negative for palpitations, orthopnea, claudication, leg swelling, syncope, PND and near-syncope.  Gastrointestinal: Negative for nausea, vomiting, abdominal pain, diarrhea, constipation, blood in stool, abdominal distention, anal bleeding and rectal pain.  Genitourinary: Negative.    Musculoskeletal: Positive for back pain (aching in his lower back) and arthralgias (knees). Negative for myalgias, joint swelling and gait problem.  Skin: Negative for color change, pallor, rash and wound.  Neurological: Negative for dizziness, tremors, seizures, syncope, facial asymmetry, speech difficulty, weakness, light-headedness, numbness and headaches.  Hematological: Negative for adenopathy. Does not bruise/bleed easily.  Psychiatric/Behavioral: Positive for sleep disturbance. Negative for suicidal ideas, hallucinations, behavioral problems, confusion, self-injury, dysphoric mood, decreased concentration and agitation. The patient is nervous/anxious. The patient is not hyperactive.        Objective:   Physical Exam  Vitals reviewed. Constitutional: He is oriented to person, place, and time. Vital signs are normal. He appears well-developed and well-nourished.  Non-toxic appearance. He does not have a sickly appearance. He does not appear ill. No distress.  HENT:  Head: Normocephalic and atraumatic.  Mouth/Throat: Oropharynx is clear and moist. No oropharyngeal exudate.  Eyes: Conjunctivae normal are normal. Right eye exhibits no discharge. Left eye exhibits no discharge. No scleral icterus.  Neck: Normal range of motion. Neck supple. No JVD present. No tracheal deviation present. No thyromegaly present.  Cardiovascular: Normal rate, regular rhythm, normal heart sounds and intact distal pulses.  Exam reveals no gallop and no friction rub.   No murmur heard. Pulmonary/Chest: Effort normal and breath sounds normal. No accessory muscle usage or stridor. Not tachypneic. No respiratory distress. He has no decreased breath sounds. He has no wheezes. He has no rhonchi. He has no rales. He exhibits no mass, no tenderness, no laceration, no crepitus, no edema, no deformity, no swelling and no retraction. Right breast exhibits no inverted nipple, no mass, no nipple discharge, no skin change and no  tenderness. Left breast exhibits no inverted nipple, no mass, no nipple discharge, no skin change and no tenderness. Breasts are symmetrical.  Abdominal: Soft. Bowel sounds are normal. He exhibits no distension and no mass. There is no tenderness. There is no rebound and no guarding.  Musculoskeletal: Normal range of motion. He exhibits no edema and no tenderness.  Lymphadenopathy:    He has no cervical adenopathy.       Right cervical: No superficial cervical, no deep cervical and no posterior cervical adenopathy present.      Left cervical: No superficial cervical, no deep cervical and no posterior cervical adenopathy present.    He has no axillary adenopathy.       Right: No supraclavicular adenopathy present.       Left: No supraclavicular adenopathy present.  Neurological: He is oriented to person, place, and time.  Skin: Skin is warm and dry. No rash noted. He is not diaphoretic. No erythema. No pallor.  Psychiatric: He has a normal mood and affect. His behavior is normal. Judgment and thought content normal.      Lab Results  Component Value Date   WBC 8.5 06/04/2012   HGB 17.5* 06/04/2012   HCT 52.0 06/04/2012   PLT 121.0* 06/04/2012   GLUCOSE 113* 06/04/2012   CHOL 194 06/04/2012   TRIG 137.0 06/04/2012   HDL 39.10 06/04/2012   LDLDIRECT 71.2 09/29/2011   LDLCALC 128* 06/04/2012   ALT 44 06/04/2012   AST 32 06/04/2012   NA 138 06/04/2012   K 4.2 06/04/2012   CL 105 06/04/2012   CREATININE 0.7 06/04/2012   BUN 9 06/04/2012   CO2 23 06/04/2012   TSH 0.88 06/04/2012   PSA 0.60 06/04/2012   INR 1.0 12/19/2008   HGBA1C 5.9 06/04/2012   MICROALBUR 0.2 10/08/2008      Assessment & Plan:

## 2012-10-04 NOTE — Assessment & Plan Note (Signed)
I will check his A1C today and will treat if needed

## 2012-10-04 NOTE — Assessment & Plan Note (Signed)
His EKG is unchanged (mild LAE and leftward axis) I will check a CXR to see if there is a structural lesion Will check labs to look for ischemia

## 2012-10-04 NOTE — Assessment & Plan Note (Signed)
Repeat CBC today 

## 2012-10-04 NOTE — Assessment & Plan Note (Signed)
His BP is well controlled 

## 2012-10-05 ENCOUNTER — Telehealth: Payer: Self-pay | Admitting: Internal Medicine

## 2012-10-05 DIAGNOSIS — J449 Chronic obstructive pulmonary disease, unspecified: Secondary | ICD-10-CM

## 2012-10-05 DIAGNOSIS — E118 Type 2 diabetes mellitus with unspecified complications: Secondary | ICD-10-CM

## 2012-10-05 DIAGNOSIS — F200 Paranoid schizophrenia: Secondary | ICD-10-CM

## 2012-10-05 DIAGNOSIS — F418 Other specified anxiety disorders: Secondary | ICD-10-CM

## 2012-10-05 DIAGNOSIS — M1710 Unilateral primary osteoarthritis, unspecified knee: Secondary | ICD-10-CM

## 2012-10-05 DIAGNOSIS — M179 Osteoarthritis of knee, unspecified: Secondary | ICD-10-CM

## 2012-10-05 DIAGNOSIS — E782 Mixed hyperlipidemia: Secondary | ICD-10-CM

## 2012-10-05 MED ORDER — PAROXETINE HCL 30 MG PO TABS: 30.0000 mg | ORAL_TABLET | ORAL | Status: DC

## 2012-10-05 MED ORDER — QUETIAPINE FUMARATE ER 50 MG PO TB24: 50.0000 mg | ORAL_TABLET | Freq: Every day | ORAL | Status: DC

## 2012-10-05 MED ORDER — CARVEDILOL 12.5 MG PO TABS: 12.5000 mg | ORAL_TABLET | Freq: Two times a day (BID) | ORAL | Status: DC

## 2012-10-05 MED ORDER — ESOMEPRAZOLE MAGNESIUM 40 MG PO CPDR: 40.0000 mg | DELAYED_RELEASE_CAPSULE | Freq: Every day | ORAL | Status: DC

## 2012-10-05 MED ORDER — PITAVASTATIN CALCIUM 2 MG PO TABS: 1.0000 | ORAL_TABLET | Freq: Every day | ORAL | Status: DC

## 2012-10-05 MED ORDER — OMEGA-3-ACID ETHYL ESTERS 1 G PO CAPS: 2.0000 g | ORAL_CAPSULE | Freq: Two times a day (BID) | ORAL | Status: DC

## 2012-10-05 MED ORDER — CELECOXIB 200 MG PO CAPS: 200.0000 mg | ORAL_CAPSULE | Freq: Every day | ORAL | Status: DC

## 2012-10-05 NOTE — Telephone Encounter (Signed)
[facility] Schanz Fax: [phone] From: [facility]  Date/Time:10/04/2012 7:04 PM Taken By: Forbes Cellar, CSR Caller: Zella Ball  Facility: [facility] Patient: [name], [name] DOB: May 10, 1961 Phone: [phone] Reason for Call: Zella Ball is calling from [facility] regarding a [place] I ordered on [place], [place] by Maisie Fus Jones1/05/2013 1:59:00 PM. The results were Less than 0.01.

## 2012-10-08 ENCOUNTER — Ambulatory Visit: Payer: Self-pay | Admitting: Internal Medicine

## 2012-10-18 ENCOUNTER — Ambulatory Visit: Payer: Self-pay | Admitting: Internal Medicine

## 2012-10-20 IMAGING — CR DG CHEST 1V PORT
1 series · 1 of 1 positions shown · non-contrast
Comparison: Portable exam 4940 hours compared to 07/03/2011

CLINICAL DATA: Chest pain, hypertension, asthma, smoker
cardiomyopathy, COPD

PORTABLE CHEST - 1 VIEW

[view not recorded]
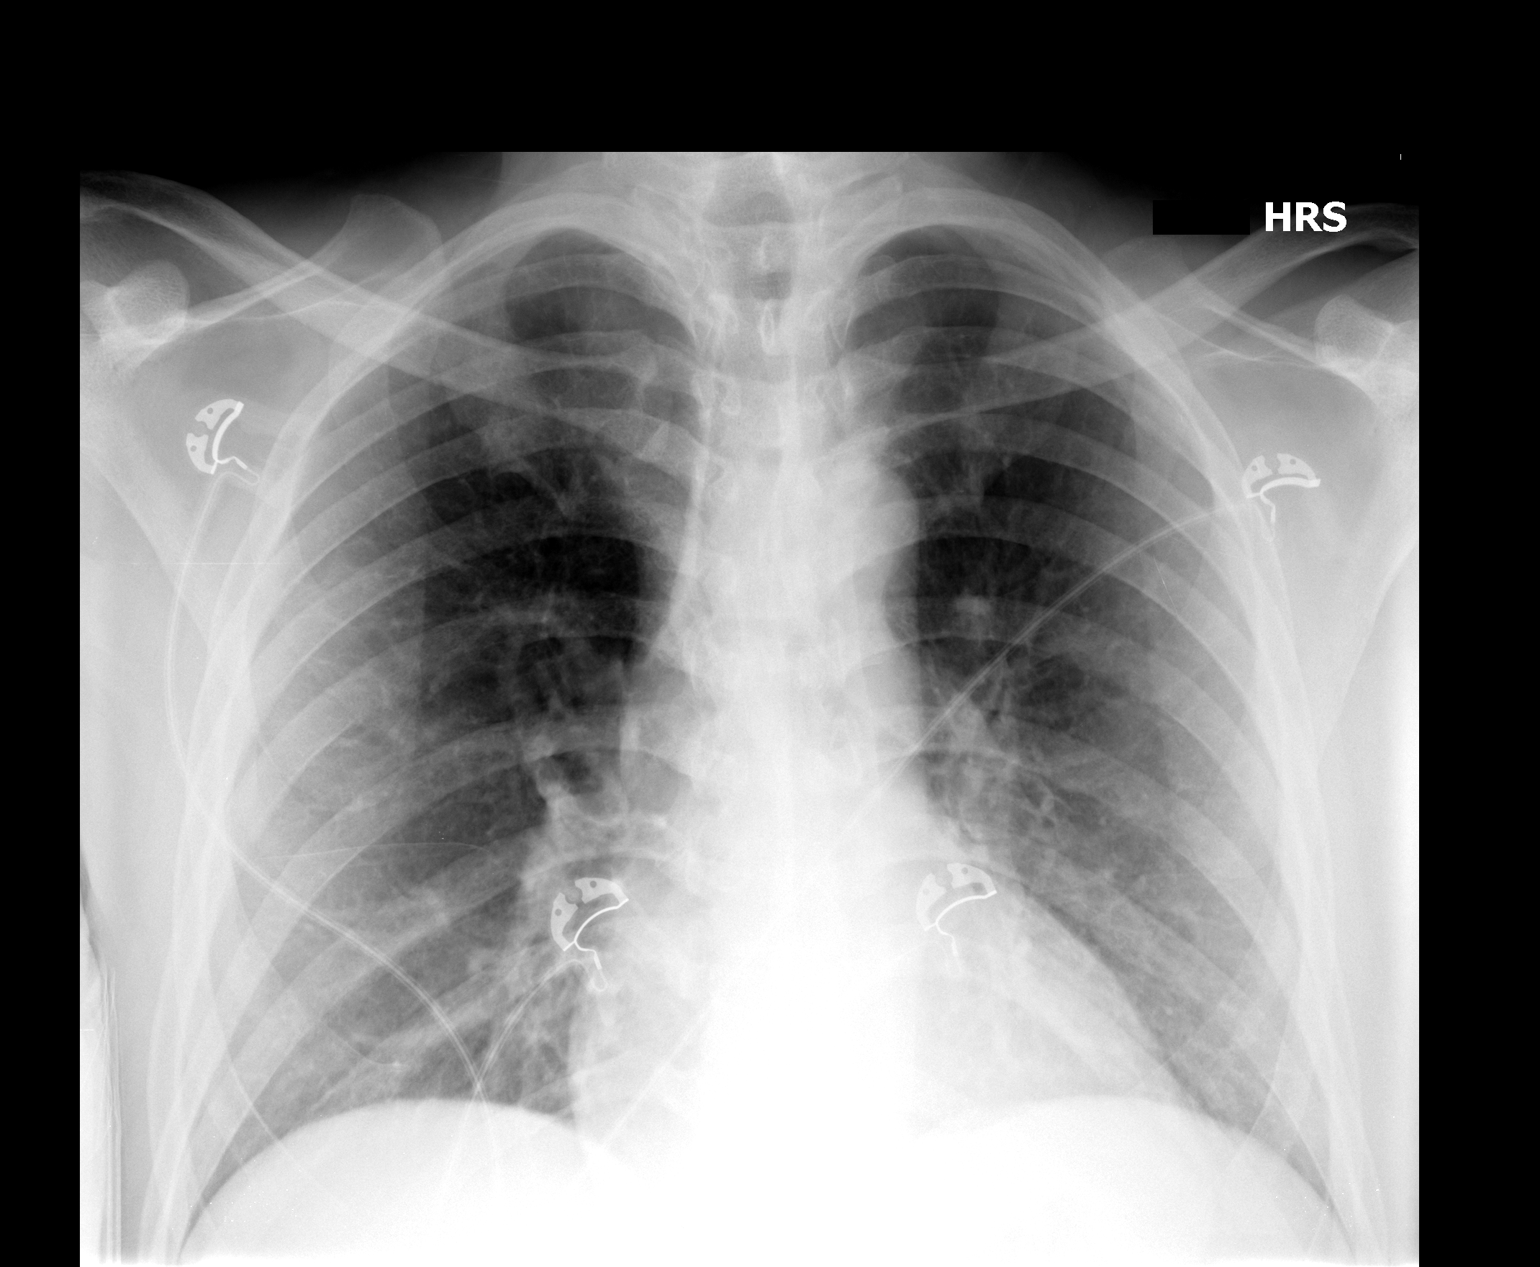

[1 of 1 positions shown; findings below may reference images not displayed]

FINDINGS: Normal heart size, mediastinal contours, and pulmonary vascularity.
Lungs clear.
No pleural effusion or pneumothorax.
Bones unremarkable.
IMPRESSION: No acute abnormalities.

## 2012-10-21 IMAGING — CT CT ANGIO CHEST
2 of 6 series · 19 of 36 positions shown · IV contrast (Omnipaque 300)
Comparison: None

CLINICAL DATA: Chest pain, positive D-dimer, shortness of breath

CT ANGIOGRAPHY CHEST WITH CONTRAST
TECHNIQUE: Multidetector CT imaging of the chest was performed
using the standard protocol during bolus administration of
intravenous contrast.  Multiplanar CT image reconstructions
including MIPs were obtained to evaluate the vascular anatomy.
Contrast: 80mL OMNIPAQUE IOHEXOL 300 MG/ML IV SOLN

[Series 5: thins (id) / (id) · axial · 0.74mm/px · z∈[-280,-28]mm · 18 of 280 slices shown]
[im 14/280  lung]
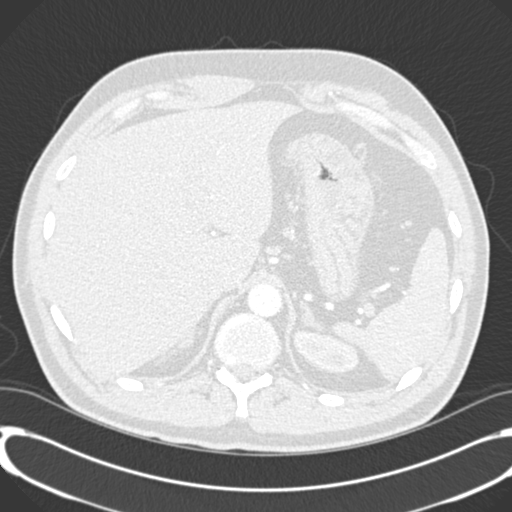
[im 28/280  mediastinal]
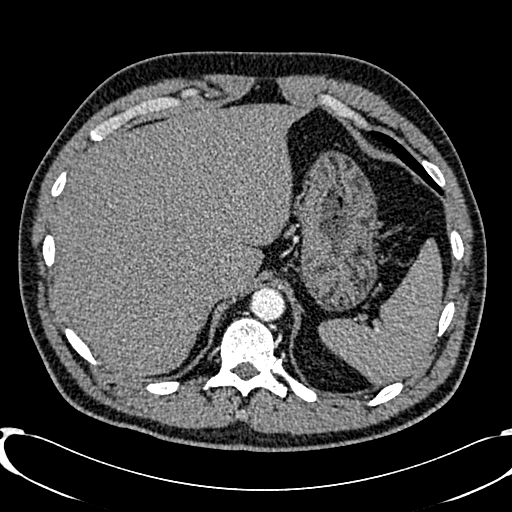
[im 42/280  lung]
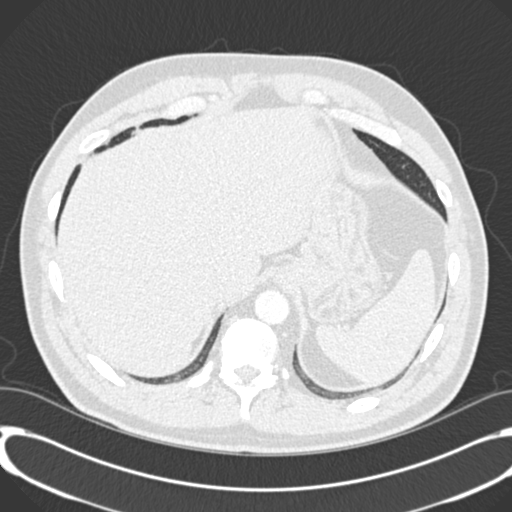
[im 56/280  mediastinal]
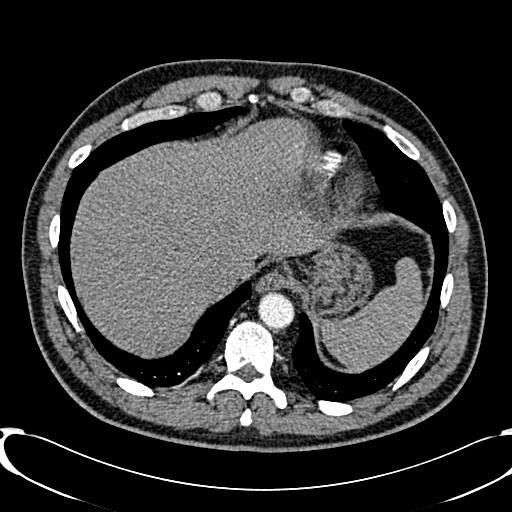
[im 70/280  lung]
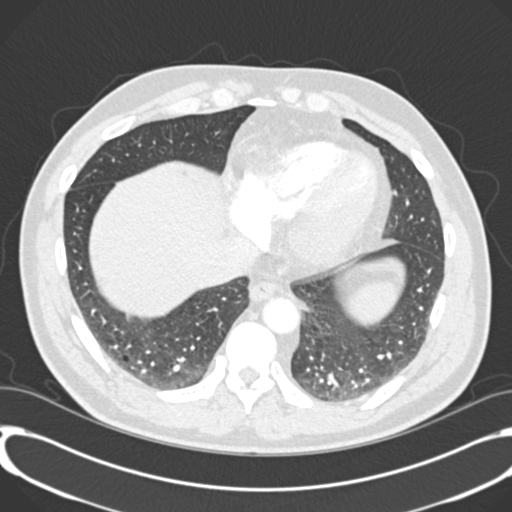
[im 84/280  mediastinal]
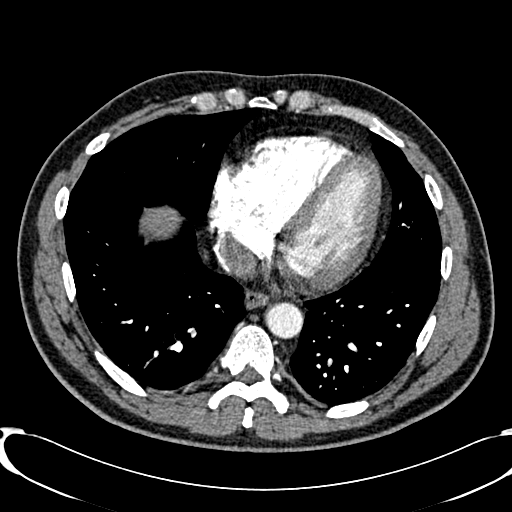
[im 98/280  lung]
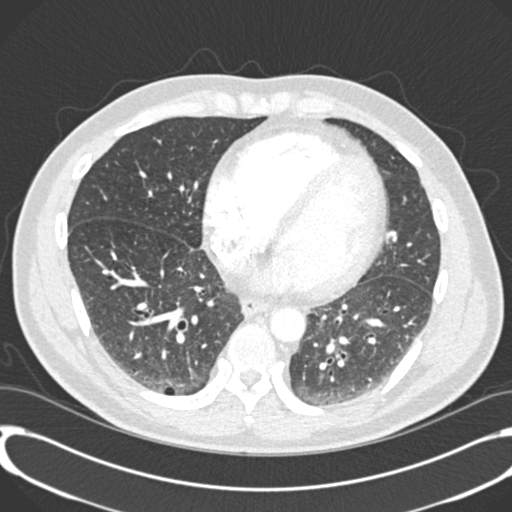
[im 112/280  mediastinal]
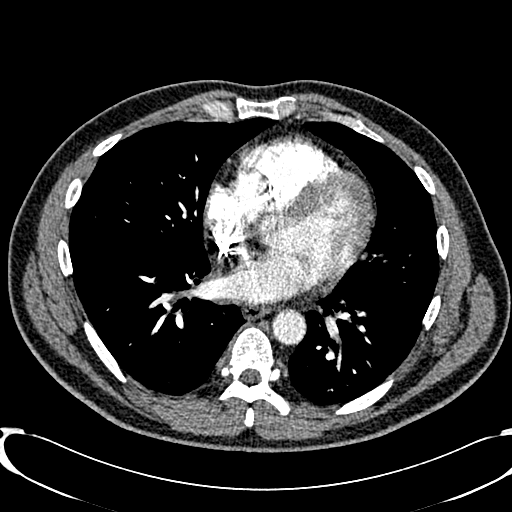
[im 126/280  lung]
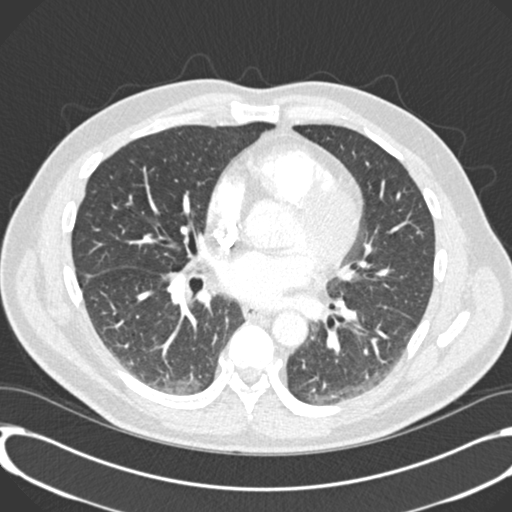
[im 154/280  mediastinal]
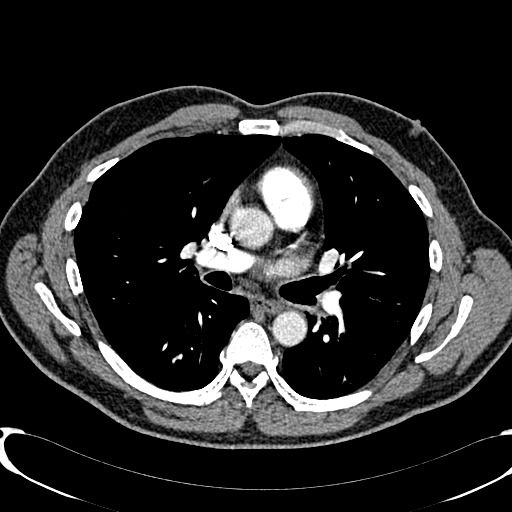
[im 168/280  lung]
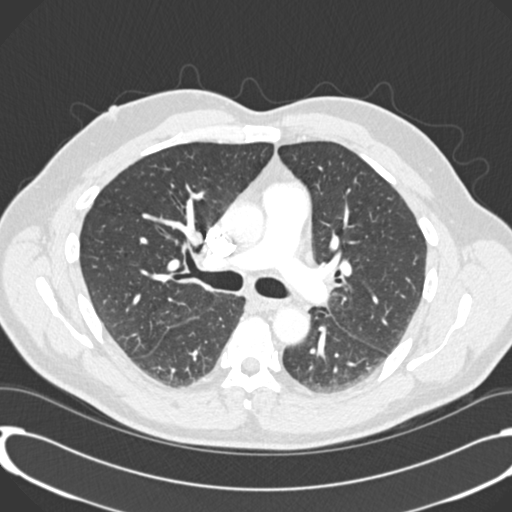
[im 182/280  mediastinal]
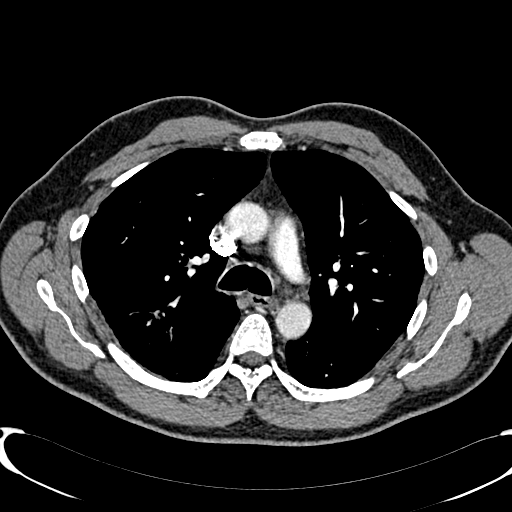
[im 196/280  lung]
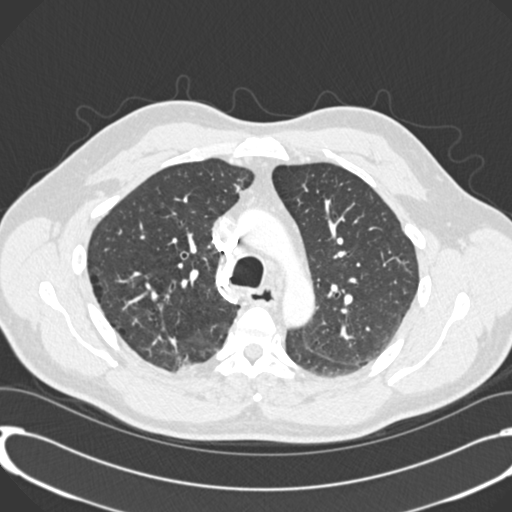
[im 210/280  mediastinal]
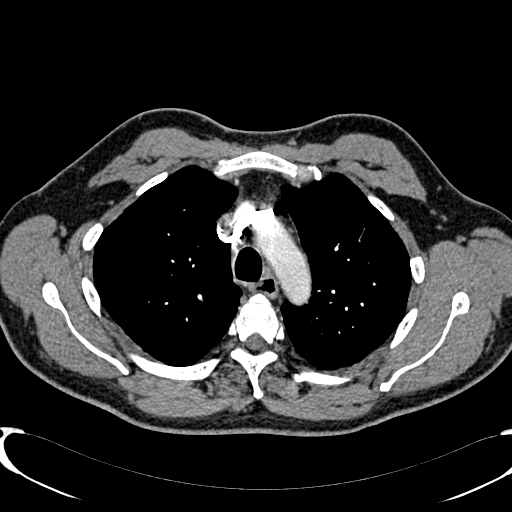
[im 224/280  lung]
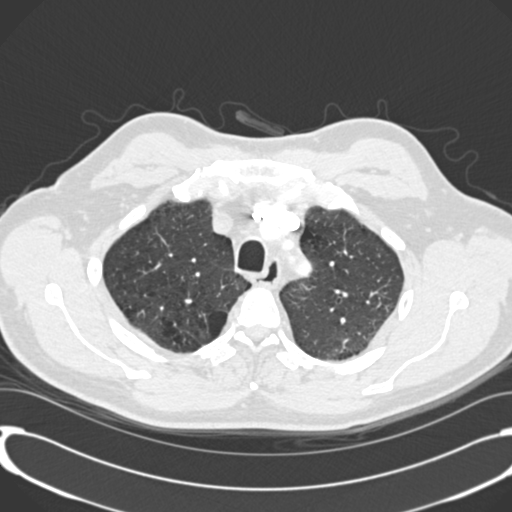
[im 238/280  mediastinal]
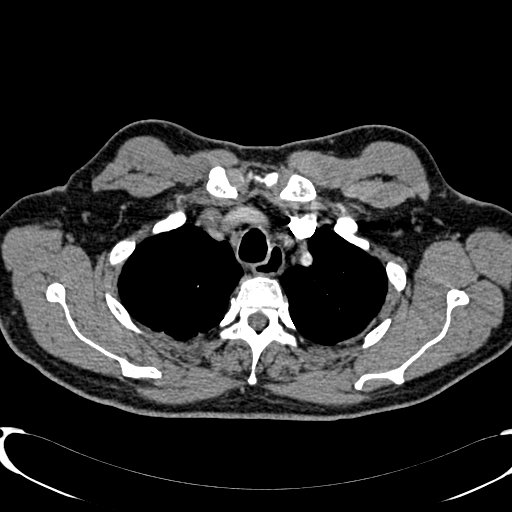
[im 252/280  lung]
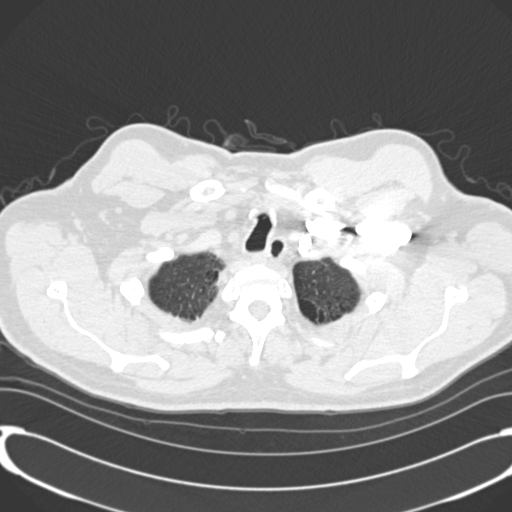
[im 266/280  mediastinal]
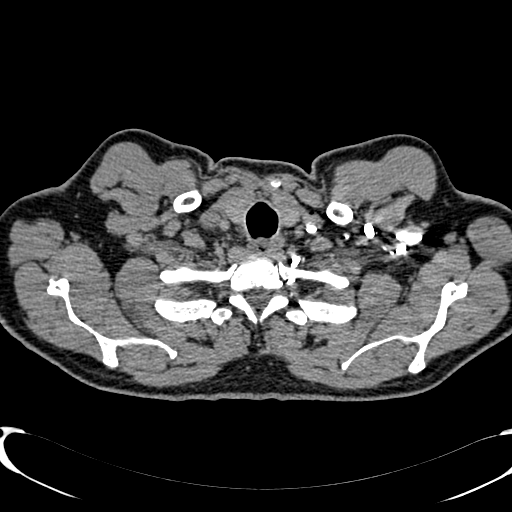

[Series 602: cor mpr · coronal · 0.74mm/px · 1 of 110 slices shown]
[im 55/110  mediastinal]
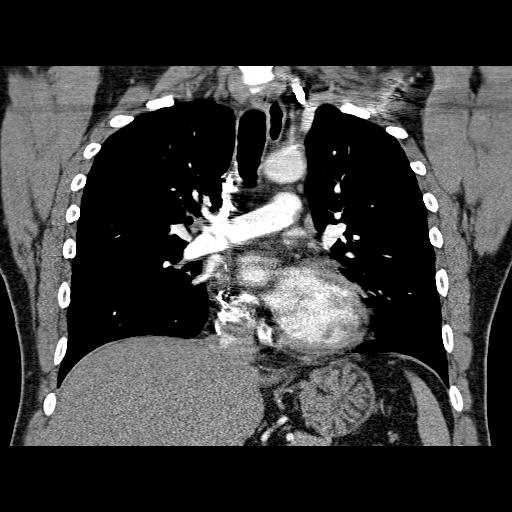

[19 of 36 positions shown; findings below may reference images not displayed]

FINDINGS: Both lungs are clear with no evidence of mass,
infiltrate, or effusion.  There is mild paraseptal emphysema.
There is no axillary, mediastinal, or hilar adenopathy.  There are
no fixed filling defects within either pulmonary arterial system to
suggest a segmental or subsegmental pulmonary embolus.  The
thoracic aorta has a normal caliber and appearance.  The osseous
structures and visualized upper abdomen are unremarkable.

Review of the MIP images confirms the above findings.
IMPRESSION: There is mild paraseptal emphysema.  Otherwise, the CT scan of the
chest is normal.

## 2012-10-25 ENCOUNTER — Ambulatory Visit: Payer: Self-pay | Admitting: Internal Medicine

## 2012-10-31 ENCOUNTER — Ambulatory Visit: Payer: 59 | Admitting: Internal Medicine

## 2012-10-31 DIAGNOSIS — Z0289 Encounter for other administrative examinations: Secondary | ICD-10-CM

## 2012-11-12 ENCOUNTER — Other Ambulatory Visit: Payer: Self-pay

## 2012-11-12 DIAGNOSIS — E782 Mixed hyperlipidemia: Secondary | ICD-10-CM

## 2012-11-12 DIAGNOSIS — E118 Type 2 diabetes mellitus with unspecified complications: Secondary | ICD-10-CM

## 2012-11-12 MED ORDER — PITAVASTATIN CALCIUM 2 MG PO TABS: 1.0000 | ORAL_TABLET | Freq: Every day | ORAL | Status: DC

## 2012-11-23 ENCOUNTER — Ambulatory Visit (INDEPENDENT_AMBULATORY_CARE_PROVIDER_SITE_OTHER): Payer: 59 | Admitting: Internal Medicine

## 2012-11-23 ENCOUNTER — Encounter: Payer: Self-pay | Admitting: Internal Medicine

## 2012-11-23 VITALS — BP 126/82 | HR 86 | Temp 98.2°F | Resp 16 | Wt 216.0 lb

## 2012-11-23 DIAGNOSIS — M179 Osteoarthritis of knee, unspecified: Secondary | ICD-10-CM

## 2012-11-23 DIAGNOSIS — B370 Candidal stomatitis: Secondary | ICD-10-CM

## 2012-11-23 DIAGNOSIS — I429 Cardiomyopathy, unspecified: Secondary | ICD-10-CM

## 2012-11-23 DIAGNOSIS — M1710 Unilateral primary osteoarthritis, unspecified knee: Secondary | ICD-10-CM

## 2012-11-23 DIAGNOSIS — I739 Peripheral vascular disease, unspecified: Secondary | ICD-10-CM

## 2012-11-23 DIAGNOSIS — J449 Chronic obstructive pulmonary disease, unspecified: Secondary | ICD-10-CM

## 2012-11-23 DIAGNOSIS — F17200 Nicotine dependence, unspecified, uncomplicated: Secondary | ICD-10-CM

## 2012-11-23 DIAGNOSIS — IMO0002 Reserved for concepts with insufficient information to code with codable children: Secondary | ICD-10-CM

## 2012-11-23 DIAGNOSIS — I10 Essential (primary) hypertension: Secondary | ICD-10-CM

## 2012-11-23 MED ORDER — ASPIRIN EC 81 MG PO TBEC: 81.0000 mg | DELAYED_RELEASE_TABLET | Freq: Every day | ORAL | Status: DC

## 2012-11-23 NOTE — Assessment & Plan Note (Signed)
Continue current meds for pain 

## 2012-11-23 NOTE — Assessment & Plan Note (Signed)
He will stop smoking Continue asa 81 mg per day Continue statins

## 2012-11-23 NOTE — Patient Instructions (Signed)

## 2012-11-23 NOTE — Progress Notes (Signed)
  Subjective:    Patient ID: [name], male    DOB: 03/09/1961, 52 y.o.   MRN: [id number]  Arthritis Presents for follow-up visit. He complains of pain. He reports no stiffness, joint swelling or joint warmth. Affected locations include the left knee and right knee. His pain is at a severity of 2/10. Pertinent negatives include no diarrhea, dry eyes, dry mouth, dysuria, fatigue, fever, pain at night, pain while resting, rash, Raynaud's syndrome, uveitis or weight loss. Compliance with total regimen is 76-100%. Compliance with medications is 76-100%.      Review of Systems  Constitutional: Negative for fever, chills, weight loss, diaphoresis, activity change, appetite change, fatigue and unexpected weight change.  HENT: Negative.   Eyes: Negative.   Respiratory: Negative for cough, chest tightness, shortness of breath, wheezing and stridor.   Cardiovascular: Negative for chest pain, palpitations and leg swelling.  Gastrointestinal: Negative for vomiting, abdominal pain, diarrhea and blood in stool.  Endocrine: Negative.   Genitourinary: Negative.  Negative for dysuria.  Musculoskeletal: Positive for back pain (chronic, unchanged), arthralgias and arthritis. Negative for myalgias, joint swelling, gait problem and stiffness.  Skin: Negative for color change, pallor, rash and wound.  Allergic/Immunologic: Negative.   Neurological: Negative for dizziness, weakness and light-headedness.  Hematological: Negative for adenopathy. Does not bruise/bleed easily.  Psychiatric/Behavioral: Negative.        Objective:   Physical Exam  Vitals reviewed. Constitutional: He is oriented to person, place, and time. He appears well-developed and well-nourished. No distress.  HENT:  Head: Normocephalic and atraumatic.  Mouth/Throat: Oropharynx is clear and moist. No oropharyngeal exudate.  Eyes: Conjunctivae are normal. Right eye exhibits no discharge. Left eye exhibits no discharge. No scleral  icterus.  Neck: Normal range of motion. Neck supple. No JVD present. No tracheal deviation present. No thyromegaly present.  Cardiovascular: Normal rate, regular rhythm, normal heart sounds and intact distal pulses.  Exam reveals no gallop and no friction rub.   No murmur heard. Pulmonary/Chest: Effort normal and breath sounds normal. No stridor. No respiratory distress. He has no wheezes. He has no rales. He exhibits no tenderness.  Abdominal: Soft. Bowel sounds are normal. He exhibits no distension and no mass. There is no tenderness. There is no rebound and no guarding.  Musculoskeletal: Normal range of motion. He exhibits no edema and no tenderness.  Lymphadenopathy:    He has no cervical adenopathy.  Neurological: He is oriented to person, place, and time.  Skin: Skin is warm and dry. No rash noted. He is not diaphoretic. No erythema. No pallor.  Psychiatric: He has a normal mood and affect. His behavior is normal. Judgment and thought content normal.     Lab Results  Component Value Date   WBC 7.4 10/04/2012   HGB 17.6* 10/04/2012   HCT 51.0 10/04/2012   PLT 112.0* 10/04/2012   GLUCOSE 90 10/04/2012   CHOL 194 06/04/2012   TRIG 137.0 06/04/2012   HDL 39.10 06/04/2012   LDLDIRECT 71.2 09/29/2011   LDLCALC 128* 06/04/2012   ALT 33 10/04/2012   AST 24 10/04/2012   NA 142 10/04/2012   K 3.9 10/04/2012   CL 108 10/04/2012   CREATININE 0.8 10/04/2012   BUN 7 10/04/2012   CO2 28 10/04/2012   TSH 0.88 06/04/2012   PSA 0.60 06/04/2012   INR 1.0 12/19/2008   HGBA1C 6.0 10/04/2012   MICROALBUR 0.2 10/08/2008       Assessment & Plan:

## 2012-11-23 NOTE — Assessment & Plan Note (Signed)
His BP is well controlled 

## 2012-11-23 NOTE — Assessment & Plan Note (Signed)
His quit date is tomorrow

## 2012-11-23 NOTE — Assessment & Plan Note (Signed)
I will recheck his HIV test today

## 2013-01-09 ENCOUNTER — Ambulatory Visit (INDEPENDENT_AMBULATORY_CARE_PROVIDER_SITE_OTHER): Payer: 59 | Admitting: Internal Medicine

## 2013-01-09 ENCOUNTER — Other Ambulatory Visit (INDEPENDENT_AMBULATORY_CARE_PROVIDER_SITE_OTHER): Payer: 59

## 2013-01-09 ENCOUNTER — Ambulatory Visit (INDEPENDENT_AMBULATORY_CARE_PROVIDER_SITE_OTHER)
Admission: RE | Admit: 2013-01-09 | Discharge: 2013-01-09 | Disposition: A | Discharge status: Home / Self Care | Payer: 59 | Source: Ambulatory Visit | Attending: Internal Medicine | Admitting: Internal Medicine

## 2013-01-09 VITALS — BP 112/72 | HR 83 | Temp 98.6°F | Resp 16 | Wt 211.5 lb

## 2013-01-09 DIAGNOSIS — K921 Melena: Secondary | ICD-10-CM | POA: Insufficient documentation

## 2013-01-09 DIAGNOSIS — K279 Peptic ulcer, site unspecified, unspecified as acute or chronic, without hemorrhage or perforation: Secondary | ICD-10-CM

## 2013-01-09 DIAGNOSIS — M1710 Unilateral primary osteoarthritis, unspecified knee: Secondary | ICD-10-CM

## 2013-01-09 DIAGNOSIS — M25551 Pain in right hip: Secondary | ICD-10-CM

## 2013-01-09 DIAGNOSIS — R10819 Abdominal tenderness, unspecified site: Secondary | ICD-10-CM

## 2013-01-09 DIAGNOSIS — D696 Thrombocytopenia, unspecified: Secondary | ICD-10-CM

## 2013-01-09 DIAGNOSIS — M25559 Pain in unspecified hip: Secondary | ICD-10-CM

## 2013-01-09 DIAGNOSIS — M179 Osteoarthritis of knee, unspecified: Secondary | ICD-10-CM

## 2013-01-09 DIAGNOSIS — M545 Low back pain, unspecified: Secondary | ICD-10-CM

## 2013-01-09 DIAGNOSIS — IMO0002 Reserved for concepts with insufficient information to code with codable children: Secondary | ICD-10-CM

## 2013-01-09 LAB — URINALYSIS, ROUTINE W REFLEX MICROSCOPIC
Bilirubin Urine: NEGATIVE
Hgb urine dipstick: NEGATIVE
Leukocytes, UA: NEGATIVE
Nitrite: NEGATIVE

## 2013-01-09 LAB — CBC WITH DIFFERENTIAL/PLATELET
Basophils Absolute: 0 10*3/uL (ref 0.0–0.1)
Eosinophils Relative: 0.7 % (ref 0.0–5.0)
Hemoglobin: 16.6 g/dL (ref 13.0–17.0)
Lymphocytes Relative: 29.8 % (ref 12.0–46.0)
Monocytes Relative: 10.2 % (ref 3.0–12.0)
Neutro Abs: 4.9 10*3/uL (ref 1.4–7.7)
Platelets: 117 10*3/uL — ABNORMAL LOW (ref 150.0–400.0)
RDW: 15 % — ABNORMAL HIGH (ref 11.5–14.6)
WBC: 8.3 10*3/uL (ref 4.5–10.5)

## 2013-01-09 LAB — LIPASE: Lipase: 25 U/L (ref 11.0–59.0)

## 2013-01-09 LAB — COMPREHENSIVE METABOLIC PANEL
ALT: 25 U/L (ref 0–53)
CO2: 26 mEq/L (ref 19–32)
Calcium: 8.8 mg/dL (ref 8.4–10.5)
Chloride: 107 mEq/L (ref 96–112)
Creatinine, Ser: 0.9 mg/dL (ref 0.4–1.5)
GFR: 112.1 mL/min (ref >60.00)
Glucose, Bld: 109 mg/dL — ABNORMAL HIGH (ref 70–99)

## 2013-01-09 LAB — HIV ANTIBODY (ROUTINE TESTING W REFLEX): HIV: NONREACTIVE

## 2013-01-09 MED ORDER — HYDROCODONE-ACETAMINOPHEN 10-325 MG PO TABS: 1.0000 | ORAL_TABLET | ORAL | Status: DC | PRN

## 2013-01-09 NOTE — Progress Notes (Signed)
Subjective:    Patient ID: [name], male    DOB: 1960-10-20, 52 y.o.   MRN: [id number]  Arthritis Presents for follow-up visit. He complains of pain. He reports no stiffness, joint swelling or joint warmth. Affected locations include the left hip, right hip, left knee and right knee. His pain is at a severity of 2/10. Associated symptoms include weight loss. Pertinent negatives include no diarrhea, dry eyes, dry mouth, dysuria, fatigue, fever, pain at night, pain while resting, rash, Raynaud's syndrome or uveitis. Compliance with total regimen is 76-100%. Compliance with medications is 76-100%.  Abdominal Pain This is a recurrent problem. The current episode started more than 1 month ago. The onset quality is gradual. The problem occurs intermittently. The problem has been unchanged. The pain is located in the suprapubic region. The pain is at a severity of 1/10. The pain is mild. The quality of the pain is dull. The abdominal pain does not radiate. Associated symptoms include arthralgias, constipation, melena and weight loss. Pertinent negatives include no anorexia, belching, diarrhea, dysuria, fever, flatus, frequency, headaches, hematochezia, hematuria, myalgias, nausea or vomiting. Nothing aggravates the pain. The pain is relieved by nothing. He has tried nothing for the symptoms. His past medical history is significant for PUD.      Review of Systems  Constitutional: Positive for weight loss. Negative for fever, chills, diaphoresis, activity change, appetite change, fatigue and unexpected weight change.  HENT: Negative.   Eyes: Negative.   Respiratory: Negative.  Negative for cough, chest tightness, shortness of breath, wheezing and stridor.   Cardiovascular: Negative.  Negative for chest pain, palpitations and leg swelling.  Gastrointestinal: Positive for abdominal pain, constipation and melena. Negative for nausea, vomiting, diarrhea, blood in stool, hematochezia, abdominal  distention, anal bleeding, rectal pain, anorexia and flatus.  Endocrine: Negative.   Genitourinary: Negative.  Negative for dysuria, urgency, frequency, hematuria, decreased urine volume and difficulty urinating.  Musculoskeletal: Positive for back pain, arthralgias and arthritis. Negative for myalgias, joint swelling, gait problem and stiffness.  Skin: Negative.  Negative for color change, pallor, rash and wound.  Allergic/Immunologic: Negative.   Neurological: Negative for headaches.  Hematological: Negative.  Negative for adenopathy. Does not bruise/bleed easily.  Psychiatric/Behavioral: Positive for sleep disturbance. Negative for suicidal ideas, hallucinations, behavioral problems, self-injury, dysphoric mood, decreased concentration and agitation. The patient is nervous/anxious. The patient is not hyperactive.        Objective:   Physical Exam  Vitals reviewed. Constitutional: He is oriented to person, place, and time. He appears well-developed and well-nourished. No distress.  HENT:  Head: Normocephalic and atraumatic.  Mouth/Throat: Oropharynx is clear and moist. No oropharyngeal exudate.  Eyes: Conjunctivae are normal. Right eye exhibits no discharge. Left eye exhibits no discharge. No scleral icterus.  Neck: Normal range of motion. Neck supple. No JVD present. No tracheal deviation present. No thyromegaly present.  Cardiovascular: Normal rate, regular rhythm, normal heart sounds and intact distal pulses.  Exam reveals no gallop and no friction rub.   No murmur heard. Pulmonary/Chest: Breath sounds normal. No stridor. No respiratory distress. He has no wheezes. He has no rales. He exhibits no tenderness.  Abdominal: Soft. Normal appearance and bowel sounds are normal. He exhibits no shifting dullness, no distension, no pulsatile liver, no fluid wave, no abdominal bruit, no ascites, no pulsatile midline mass and no mass. There is no hepatosplenomegaly, splenomegaly or hepatomegaly.  There is no tenderness. There is no rigidity, no rebound, no guarding, no CVA tenderness, no tenderness at  McBurney's point and negative Murphy's sign. No hernia. Hernia confirmed negative in the ventral area, confirmed negative in the right inguinal area and confirmed negative in the left inguinal area.  Genitourinary: Testes normal and penis normal. Rectal exam shows anal tone abnormal. Rectal exam shows no external hemorrhoid, no internal hemorrhoid, no fissure, no mass and no tenderness. Guaiac positive stool. Prostate is not enlarged and not tender. Right testis shows no mass, no swelling and no tenderness. Right testis is descended. Left testis shows no mass, no swelling and no tenderness. Left testis is descended. No penile erythema or penile tenderness. No discharge found.  Musculoskeletal: Normal range of motion. He exhibits no edema and no tenderness.  Lymphadenopathy:    He has no cervical adenopathy.       Right: No inguinal adenopathy present.       Left: No inguinal adenopathy present.  Neurological: He is oriented to person, place, and time.  Skin: Skin is warm and dry. No rash noted. He is not diaphoretic. No erythema. No pallor.  Psychiatric: He has a normal mood and affect. His behavior is normal. Judgment and thought content normal.     Lab Results  Component Value Date   WBC 7.4 10/04/2012   HGB 17.6* 10/04/2012   HCT 51.0 10/04/2012   PLT 112.0* 10/04/2012   GLUCOSE 90 10/04/2012   CHOL 194 06/04/2012   TRIG 137.0 06/04/2012   HDL 39.10 06/04/2012   LDLDIRECT 71.2 09/29/2011   LDLCALC 128* 06/04/2012   ALT 33 10/04/2012   AST 24 10/04/2012   NA 142 10/04/2012   K 3.9 10/04/2012   CL 108 10/04/2012   CREATININE 0.8 10/04/2012   BUN 7 10/04/2012   CO2 28 10/04/2012   TSH 0.88 06/04/2012   PSA 0.60 06/04/2012   INR 1.0 12/19/2008   HGBA1C 6.0 10/04/2012   MICROALBUR 0.2 10/08/2008       Assessment & Plan:

## 2013-01-09 NOTE — Patient Instructions (Signed)
Abdominal Pain  Abdominal pain can be caused by many things. Your caregiver decides the seriousness of your pain by an examination and possibly blood tests and X-rays. Many cases can be observed and treated at home. Most abdominal pain is not caused by a disease and will probably improve without treatment. However, in many cases, more time must pass before a clear cause of the pain can be found. Before that point, it may not be known if you need more testing, or if hospitalization or surgery is needed.  HOME CARE INSTRUCTIONS   · Do not take laxatives unless directed by your caregiver.  · Take pain medicine only as directed by your caregiver.  · Only take over-the-counter or prescription medicines for pain, discomfort, or fever as directed by your caregiver.  · Try a clear liquid diet (broth, tea, or water) for as long as directed by your caregiver. Slowly move to a bland diet as tolerated.  SEEK IMMEDIATE MEDICAL CARE IF:   · The pain does not go away.  · You have a fever.  · You keep throwing up (vomiting).  · The pain is felt only in portions of the abdomen. Pain in the right side could possibly be appendicitis. In an adult, pain in the left lower portion of the abdomen could be colitis or diverticulitis.  · You pass bloody or black tarry stools.  MAKE SURE YOU:   · Understand these instructions.  · Will watch your condition.  · Will get help right away if you are not doing well or get worse.  Document Released: 06/22/2005 Document Revised: 12/05/2011 Document Reviewed: 04/30/2008  [employer]® Patient Information ©2013 [employer].

## 2013-01-10 ENCOUNTER — Encounter: Payer: Self-pay | Admitting: Internal Medicine

## 2013-01-11 ENCOUNTER — Encounter: Payer: Self-pay | Admitting: Internal Medicine

## 2013-01-11 NOTE — Assessment & Plan Note (Signed)
I will check plain films today He will continue the current meds for pain relief

## 2013-01-11 NOTE — Assessment & Plan Note (Signed)
I will check his labs to look for secondary causes 

## 2013-01-11 NOTE — Assessment & Plan Note (Signed)
He will stop celebrex and will restart nexium I have asked him to see GI (? Need for EGD)

## 2013-01-11 NOTE — Assessment & Plan Note (Signed)
His CBC today shows H/H is down very very slightly He will restart nexium I have asked him to see GI

## 2013-01-11 NOTE — Assessment & Plan Note (Signed)
CBC today.  

## 2013-01-11 NOTE — Assessment & Plan Note (Signed)
I will check a plain film today to see if he has DJD, AVN, etc

## 2013-01-31 ENCOUNTER — Ambulatory Visit (INDEPENDENT_AMBULATORY_CARE_PROVIDER_SITE_OTHER): Payer: 59 | Admitting: Internal Medicine

## 2013-01-31 ENCOUNTER — Encounter: Payer: Self-pay | Admitting: Internal Medicine

## 2013-01-31 VITALS — BP 100/60 | HR 106 | Temp 98.7°F | Resp 16 | Wt 205.0 lb

## 2013-01-31 DIAGNOSIS — I10 Essential (primary) hypertension: Secondary | ICD-10-CM

## 2013-01-31 DIAGNOSIS — K279 Peptic ulcer, site unspecified, unspecified as acute or chronic, without hemorrhage or perforation: Secondary | ICD-10-CM

## 2013-01-31 NOTE — Patient Instructions (Signed)

## 2013-01-31 NOTE — Assessment & Plan Note (Signed)
He has started smoking heavily again He will continue nexium He sees GI soon about this

## 2013-01-31 NOTE — Progress Notes (Signed)
  Subjective:    Patient ID: [name], male    DOB: Nov 23, 1960, 52 y.o.   MRN: [id number]  HPI  He returns for fu/p and he tells me that the abd pain and melena has resolved. He feels well today and offers no complaints.  Review of Systems  All other systems reviewed and are negative.       Objective:   Physical Exam  Vitals reviewed. Constitutional: He is oriented to person, place, and time. He appears well-developed and well-nourished. No distress.  HENT:  Head: Normocephalic and atraumatic.  Mouth/Throat: Oropharynx is clear and moist. No oropharyngeal exudate.  Eyes: Conjunctivae are normal. Right eye exhibits no discharge. Left eye exhibits no discharge. No scleral icterus.  Neck: Normal range of motion. Neck supple. No JVD present. No tracheal deviation present. No thyromegaly present.  Cardiovascular: Normal rate, regular rhythm, normal heart sounds and intact distal pulses.  Exam reveals no gallop and no friction rub.   No murmur heard. Pulmonary/Chest: Effort normal and breath sounds normal. No stridor. No respiratory distress. He has no wheezes. He has no rales. He exhibits no tenderness.  Abdominal: Soft. Bowel sounds are normal. He exhibits no distension and no mass. There is no tenderness. There is no rebound and no guarding.  Musculoskeletal: Normal range of motion. He exhibits no edema and no tenderness.  Lymphadenopathy:    He has no cervical adenopathy.  Neurological: He is oriented to person, place, and time.  Skin: Skin is warm and dry. No rash noted. He is not diaphoretic. No erythema. No pallor.  Psychiatric: He has a normal mood and affect. His behavior is normal. Judgment and thought content normal.      Lab Results  Component Value Date   WBC 8.3 01/09/2013   HGB 16.6 01/09/2013   HCT 48.8 01/09/2013   PLT 117.0* 01/09/2013   GLUCOSE 109* 01/09/2013   CHOL 194 06/04/2012   TRIG 137.0 06/04/2012   HDL 39.10 06/04/2012   LDLDIRECT 71.2 09/29/2011   LDLCALC  128* 06/04/2012   ALT 25 01/09/2013   AST 21 01/09/2013   NA 139 01/09/2013   K 4.8 01/09/2013   CL 107 01/09/2013   CREATININE 0.9 01/09/2013   BUN 12 01/09/2013   CO2 26 01/09/2013   TSH 0.88 06/04/2012   PSA 0.60 06/04/2012   INR 1.0 12/19/2008   HGBA1C 6.0 10/04/2012   MICROALBUR 0.2 10/08/2008      Assessment & Plan:

## 2013-01-31 NOTE — Assessment & Plan Note (Signed)
His BP is well controlled 

## 2013-02-12 ENCOUNTER — Encounter: Payer: Self-pay | Admitting: Internal Medicine

## 2013-02-12 ENCOUNTER — Ambulatory Visit (INDEPENDENT_AMBULATORY_CARE_PROVIDER_SITE_OTHER): Payer: 59 | Admitting: Internal Medicine

## 2013-02-12 VITALS — BP 100/60 | HR 100 | Ht 75.0 in | Wt 205.4 lb

## 2013-02-12 DIAGNOSIS — Z86010 Personal history of colonic polyps: Secondary | ICD-10-CM

## 2013-02-12 DIAGNOSIS — K921 Melena: Secondary | ICD-10-CM

## 2013-02-12 MED ORDER — NA SULFATE-K SULFATE-MG SULF 17.5-3.13-1.6 GM/177ML PO SOLN: ORAL | Status: DC

## 2013-02-12 NOTE — Progress Notes (Signed)
  Subjective:    Patient ID: [name], male    DOB: 10/01/1960, 52 y.o.   MRN: [id number]  HPI This man presents with c/o melena for a few days in mid April. He saw Dr. Yetta Barre and had a he,me + stool, color not described. Says melena lasted only a few days. Also having mild-moderate periumbilcal pain. He says he is better on PPI. Claims weight is down. Wt Readings from Last 3 Encounters:  02/12/13 205 lb 6 oz (93.157 kg)  01/31/13 205 lb (92.987 kg)  01/09/13 211 lb 8 oz (95.936 kg)   He also has a hx of adnomatous colon polyps and is overdue for a surveillance/screening colonoscopy. Medications, allergies, past medical history, past surgical history, family history and social history are reviewed and updated in the EMR.   Review of Systems Is a paranoid schizophrenic - did go to ED in [place] yesterday - heard some voices - better today, no homicidal or suicidal ideation. All other ROS negative or as above.    Objective:   Physical Exam General:  Well-developed, well-nourished and in no acute distress Eyes:  anicteric. ENT:   Mouth and posterior pharynx free of lesions + dentures.  Neck:   supple w/o thyromegaly or mass.  Lungs: Clear to auscultation bilaterally. Heart:  S1S2, no rubs, murmurs, gallops. Abdomen:  soft, non-tender, no hepatosplenomegaly, hernia, or mass and BS+.  Rectal: deferred Lymph:  no cervical or supraclavicular adenopathy. Extremities:   no edema Skin   no rash. Neuro:  A&O x 3.  Psych:  appropriate mood and  Affect.   Data Reviewed: Lab Results  Component Value Date   WBC 8.3 01/09/2013   HGB 16.6 01/09/2013   HCT 48.8 01/09/2013   MCV 94.4 01/09/2013   PLT 117.0* 01/09/2013   Dr. Yetta Barre 01/09/2013 note    Assessment & Plan:  Blood in stool  Personal history of colonic polyps   1. Evaluate these signs and sxs and hx of polyps with EGD and colonoscopy The risks and benefits as well as alternatives of endoscopic procedure(s) have been discussed  and reviewed. All questions answered. The patient agrees to proceed.

## 2013-02-12 NOTE — Patient Instructions (Addendum)
You have been scheduled for an endoscopy and colonoscopy with propofol. Please follow the written instructions given to you at your visit today. Please use the prep kit you have been given today.. If you use inhalers (even only as needed), please bring them with you on the day of your procedure. Your physician has requested that you go to www.startemmi.com and enter the access code given to you at your visit today. This web site gives a general overview about your procedure. However, you should still follow specific instructions given to you by our office regarding your preparation for the procedure.  Thank you for choosing me and [facility].  Iva Boop, M.D., [facility]

## 2013-02-13 ENCOUNTER — Encounter: Payer: Self-pay | Admitting: Internal Medicine

## 2013-02-28 ENCOUNTER — Encounter: Payer: 59 | Admitting: Internal Medicine

## 2013-02-28 ENCOUNTER — Telehealth: Payer: Self-pay | Admitting: Internal Medicine

## 2013-02-28 NOTE — Telephone Encounter (Signed)
Ok No charge

## 2013-02-28 NOTE — Telephone Encounter (Signed)
Please contact him in the future about rescheduling

## 2013-03-13 ENCOUNTER — Encounter: Payer: Self-pay | Admitting: Internal Medicine

## 2013-03-13 MED ORDER — ALBUTEROL SULFATE HFA 108 (90 BASE) MCG/ACT IN AERS: 2.0000 | INHALATION_SPRAY | Freq: Four times a day (QID) | RESPIRATORY_TRACT | Status: DC | PRN

## 2013-03-25 ENCOUNTER — Other Ambulatory Visit: Payer: Self-pay | Admitting: Internal Medicine

## 2013-03-26 ENCOUNTER — Encounter: Payer: Self-pay | Admitting: Internal Medicine

## 2013-04-20 ENCOUNTER — Other Ambulatory Visit: Payer: Self-pay | Admitting: Internal Medicine

## 2013-04-30 ENCOUNTER — Other Ambulatory Visit: Payer: Self-pay | Admitting: Internal Medicine

## 2013-04-30 ENCOUNTER — Telehealth: Payer: Self-pay | Admitting: Internal Medicine

## 2013-04-30 NOTE — Telephone Encounter (Signed)
Pt's pharmacy [facility] on [address]) told him the hydrocodone was denied.  Please call him to let him know if it is going to be called in.

## 2013-05-30 ENCOUNTER — Ambulatory Visit: Payer: Self-pay | Admitting: Internal Medicine

## 2013-06-06 ENCOUNTER — Encounter: Payer: Self-pay | Admitting: Internal Medicine

## 2013-06-06 ENCOUNTER — Ambulatory Visit (INDEPENDENT_AMBULATORY_CARE_PROVIDER_SITE_OTHER): Payer: 59 | Admitting: Internal Medicine

## 2013-06-06 ENCOUNTER — Other Ambulatory Visit (INDEPENDENT_AMBULATORY_CARE_PROVIDER_SITE_OTHER): Payer: 59

## 2013-06-06 VITALS — BP 110/68 | HR 85 | Temp 98.4°F | Resp 16 | Wt 196.0 lb

## 2013-06-06 DIAGNOSIS — R7309 Other abnormal glucose: Secondary | ICD-10-CM

## 2013-06-06 DIAGNOSIS — E782 Mixed hyperlipidemia: Secondary | ICD-10-CM

## 2013-06-06 DIAGNOSIS — I10 Essential (primary) hypertension: Secondary | ICD-10-CM

## 2013-06-06 DIAGNOSIS — B370 Candidal stomatitis: Secondary | ICD-10-CM

## 2013-06-06 DIAGNOSIS — Z23 Encounter for immunization: Secondary | ICD-10-CM

## 2013-06-06 DIAGNOSIS — Z0000 Encounter for general adult medical examination without abnormal findings: Secondary | ICD-10-CM

## 2013-06-06 LAB — LIPID PANEL
HDL: 28.6 mg/dL — ABNORMAL LOW (ref >39.00)
LDL Cholesterol: 95 mg/dL (ref 0–99)
Total CHOL/HDL Ratio: 5
VLDL: 22.4 mg/dL (ref 0.0–40.0)

## 2013-06-06 LAB — CBC WITH DIFFERENTIAL/PLATELET
Basophils Relative: 0.4 % (ref 0.0–3.0)
Eosinophils Absolute: 0.1 10*3/uL (ref 0.0–0.7)
Hemoglobin: 16.5 g/dL (ref 13.0–17.0)
Lymphocytes Relative: 30.9 % (ref 12.0–46.0)
MCHC: 34.4 g/dL (ref 30.0–36.0)
MCV: 92.9 fl (ref 78.0–100.0)
Neutro Abs: 5 10*3/uL (ref 1.4–7.7)
RBC: 5.17 Mil/uL (ref 4.22–5.81)

## 2013-06-06 LAB — COMPREHENSIVE METABOLIC PANEL
AST: 16 U/L (ref 0–37)
Alkaline Phosphatase: 57 U/L (ref 39–117)
BUN: 8 mg/dL (ref 6–23)
Calcium: 8.9 mg/dL (ref 8.4–10.5)
Creatinine, Ser: 0.9 mg/dL (ref 0.4–1.5)
Total Bilirubin: 1 mg/dL (ref 0.3–1.2)

## 2013-06-06 LAB — TSH: TSH: 0.68 u[IU]/mL (ref 0.35–5.50)

## 2013-06-06 NOTE — Patient Instructions (Signed)

## 2013-06-06 NOTE — Assessment & Plan Note (Signed)
LDL goal has been achieved

## 2013-06-06 NOTE — Assessment & Plan Note (Signed)
His BP is well controlled 

## 2013-06-06 NOTE — Assessment & Plan Note (Signed)
His A1C is c/w pre-diabetes He is working on his lifestyle modifications

## 2013-06-06 NOTE — Progress Notes (Signed)
  Subjective:    Patient ID: [name], male    DOB: Mar 17, 1961, 52 y.o.   MRN: [id number]  Hyperlipidemia This is a chronic problem. The current episode started more than 1 year ago. The problem is controlled. Recent lipid tests were reviewed and are variable. Exacerbating diseases include diabetes, liver disease and obesity. He has no history of chronic renal disease or hypothyroidism. Factors aggravating his hyperlipidemia include smoking. Pertinent negatives include no chest pain, focal sensory loss, focal weakness, leg pain, myalgias or shortness of breath. Current antihyperlipidemic treatment includes statins and diet change. The current treatment provides moderate improvement of lipids. Compliance problems include adherence to exercise.       Review of Systems  Constitutional: Negative.   HENT: Negative.   Eyes: Negative.   Respiratory: Negative.  Negative for cough, choking, chest tightness, shortness of breath, wheezing and stridor.   Cardiovascular: Negative.  Negative for chest pain, palpitations and leg swelling.  Gastrointestinal: Negative.  Negative for nausea, vomiting, abdominal pain, diarrhea and constipation.  Endocrine: Negative.   Genitourinary: Negative.   Musculoskeletal: Negative.  Negative for myalgias, back pain, joint swelling, arthralgias and gait problem.  Skin: Negative.   Allergic/Immunologic: Negative.   Neurological: Negative.  Negative for focal weakness.  Hematological: Negative.  Negative for adenopathy. Does not bruise/bleed easily.  Psychiatric/Behavioral: Negative.        Objective:   Physical Exam  Vitals reviewed. Constitutional: He is oriented to person, place, and time. He appears well-developed and well-nourished. No distress.  HENT:  Head: Normocephalic and atraumatic.  Mouth/Throat: Oropharynx is clear and moist. No oropharyngeal exudate.  Eyes: Conjunctivae are normal. Right eye exhibits no discharge. Left eye exhibits no discharge.  No scleral icterus.  Neck: Normal range of motion. Neck supple. No JVD present. No tracheal deviation present. No thyromegaly present.  Cardiovascular: Normal rate, regular rhythm, normal heart sounds and intact distal pulses.  Exam reveals no gallop and no friction rub.   No murmur heard. Pulmonary/Chest: Effort normal and breath sounds normal. No stridor. No respiratory distress. He has no wheezes. He has no rales. He exhibits no tenderness.  Abdominal: Soft. Bowel sounds are normal. He exhibits no distension and no mass. There is no tenderness. There is no rebound and no guarding.  Musculoskeletal: Normal range of motion. He exhibits no edema.  Lymphadenopathy:    He has no cervical adenopathy.  Neurological: He is oriented to person, place, and time.  Skin: Skin is warm and dry. No rash noted. He is not diaphoretic. No erythema. No pallor.     Lab Results  Component Value Date   WBC 8.3 01/09/2013   HGB 16.6 01/09/2013   HCT 48.8 01/09/2013   PLT 117.0* 01/09/2013   GLUCOSE 109* 01/09/2013   CHOL 194 06/04/2012   TRIG 137.0 06/04/2012   HDL 39.10 06/04/2012   LDLDIRECT 71.2 09/29/2011   LDLCALC 128* 06/04/2012   ALT 25 01/09/2013   AST 21 01/09/2013   NA 139 01/09/2013   K 4.8 01/09/2013   CL 107 01/09/2013   CREATININE 0.9 01/09/2013   BUN 12 01/09/2013   CO2 26 01/09/2013   TSH 0.88 06/04/2012   PSA 0.60 06/04/2012   INR 1.0 12/19/2008   HGBA1C 6.0 10/04/2012   MICROALBUR 0.2 10/08/2008       Assessment & Plan:

## 2013-06-07 ENCOUNTER — Encounter: Payer: Self-pay | Admitting: Internal Medicine

## 2013-07-05 ENCOUNTER — Telehealth: Payer: Self-pay | Admitting: Internal Medicine

## 2013-07-05 MED ORDER — HYDROCODONE-ACETAMINOPHEN 10-325 MG PO TABS: ORAL_TABLET | ORAL | Status: DC

## 2013-07-05 NOTE — Telephone Encounter (Signed)
Pt request refill for hydrocodone. Please advise. Pt is aware that Dr. Yetta Barre is not in the office.

## 2013-07-05 NOTE — Telephone Encounter (Signed)
Done hardcopy to robin  

## 2013-07-23 ENCOUNTER — Telehealth: Payer: Self-pay

## 2013-07-23 DIAGNOSIS — F418 Other specified anxiety disorders: Secondary | ICD-10-CM

## 2013-07-23 MED ORDER — TRAZODONE HCL 100 MG PO TABS: ORAL_TABLET | ORAL | Status: DC

## 2013-07-23 NOTE — Telephone Encounter (Signed)
Patient called triage line requesting refill on trazodone med was last filled on 08/22/2012 and patient was last seen on 06/06/2013.  Please advise,   Thanks!

## 2013-07-23 NOTE — Telephone Encounter (Signed)
Pt called requesting RX refill for Vicodin (last filled 07/05/13) and trazodone (08/22/12), pt last seen 06/06/13. Please advise if ok and if you want 3 separate scripts for Vicodin?

## 2013-07-24 ENCOUNTER — Telehealth: Payer: Self-pay | Admitting: Internal Medicine

## 2013-07-24 MED ORDER — HYDROCODONE-ACETAMINOPHEN 10-325 MG PO TABS: ORAL_TABLET | ORAL | Status: DC

## 2013-07-24 NOTE — Telephone Encounter (Signed)
07/24/2013   Pt needs refill on HYDROcodone-acetaminophen (NORCO) 10-325 MG per tablet. PT # [phone]

## 2013-07-24 NOTE — Telephone Encounter (Signed)
RX ready, called pt//lmovm to pick up

## 2013-08-08 ENCOUNTER — Emergency Department (INDEPENDENT_AMBULATORY_CARE_PROVIDER_SITE_OTHER)
Admission: EM | Admit: 2013-08-08 | Discharge: 2013-08-08 | Disposition: A | Discharge status: Home / Self Care | Payer: 59 | Source: Home / Self Care | Attending: Emergency Medicine | Admitting: Emergency Medicine

## 2013-08-08 ENCOUNTER — Encounter (HOSPITAL_COMMUNITY): Payer: Self-pay | Admitting: Emergency Medicine

## 2013-08-08 ENCOUNTER — Emergency Department (INDEPENDENT_AMBULATORY_CARE_PROVIDER_SITE_OTHER): Payer: 59

## 2013-08-08 DIAGNOSIS — M25519 Pain in unspecified shoulder: Secondary | ICD-10-CM

## 2013-08-08 DIAGNOSIS — M542 Cervicalgia: Secondary | ICD-10-CM

## 2013-08-08 DIAGNOSIS — M25511 Pain in right shoulder: Secondary | ICD-10-CM

## 2013-08-08 MED ORDER — MELOXICAM 7.5 MG PO TABS: 7.5000 mg | ORAL_TABLET | Freq: Every day | ORAL | Status: DC

## 2013-08-08 NOTE — ED Provider Notes (Signed)
CSN: [id number]     Arrival date & time 08/08/13  1220 History   First MD Initiated Contact with Patient 08/08/13 1249     Chief Complaint  Patient presents with  . Extremity Pain   (Consider location/radiation/quality/duration/timing/severity/associated sxs/prior Treatment) Patient is a 52 y.o. male presenting with extremity pain. The history is provided by the patient. No language interpreter was used.  Extremity Pain This is a new problem. The problem occurs constantly. The problem has been gradually worsening. Nothing aggravates the symptoms. Nothing relieves the symptoms. He has tried nothing for the symptoms. The treatment provided no relief.  Pt complains of pain in right shoulder and neck.  Pt reports pain down to thumb.    Past Medical History  Diagnosis Date  . PVD (peripheral vascular disease)   . Cough   . Rhinitis   . Chronic airway obstruction, not elsewhere classified   . Family history of colonic polyps   . Family history of malignant neoplasm of gastrointestinal tract   . Personal history of colonic polyps   . Dysphagia, unspecified(787.20)   . Thrombocytopenia, unspecified   . 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 Tobacco use disorder   . Lumbago   . Type II or unspecified type diabetes mellitus with unspecified complication, not stated as uncontrolled   . Hypertension     MIXED  . Hypertrophy of prostate with urinary obstruction and other lower urinary tract symptoms (LUTS)   . Asthma   . GERD (gastroesophageal reflux disease)   . Schizophrenia   . PUD (peptic ulcer disease)   . Colon polyp    Past Surgical History  Procedure Laterality Date  . Tracheostomy    . Carpal tunnel release Right     WRIST  . Iliac artery stent Left 07/2009    STENT COMMON ILIAC ARTERY. (DR. Allyson Sabal)  . Podiatric Left 2011    FOOT SURGERY  . Coronary stent placement    . Colonoscopy  approx 2-3 years ago   Family  History  Problem Relation Age of Onset  . Colon cancer Mother   . Stroke Mother   . Colon cancer Mother   . Dementia Mother   . Diabetes      3/6 siblings  . Alcohol abuse    . Arthritis    . Hypertension    . Hyperlipidemia    . Heart disease Father   . Heart attack Father   . Alcohol abuse Father   . Colon polyps Sister   . Alcohol abuse Sister   . Anxiety disorder Sister   . Depression Sister   . Drug abuse Brother   . Alcohol abuse Brother   . Alcohol abuse Sister   . Alcohol abuse Sister   . Depression Sister   . Anxiety disorder Sister   . Drug abuse Brother   . Alcohol abuse Brother    History  Substance Use Topics  . Smoking status: Current Every Day Smoker -- 1.00 packs/day for 38 years    Types: Cigarettes  . Smokeless tobacco: Never Used  . Alcohol Use: No    Review of Systems  Musculoskeletal: Positive for joint swelling and myalgias.  All other systems reviewed and are negative.    Allergies  Crestor; Ace inhibitors; and Clopidogrel bisulfate  Home Medications   Current Outpatient Rx  Name  Route  Sig  Dispense  Refill  . albuterol (PROAIR HFA) 108 (90 BASE)  MCG/ACT inhaler   Inhalation   Inhale 2 puffs into the lungs every 6 (six) hours as needed.   1 Inhaler   5   . albuterol (PROVENTIL) (2.5 MG/3ML) 0.083% nebulizer solution   Nebulization   Take 3 mLs (2.5 mg total) by nebulization every 4 (four) hours as needed.   260 mL   0   . ALPRAZolam (XANAX) 1 MG tablet      take 1 tablet by mouth three times a day if needed   90 tablet   3   . Blood Glucose Calibration (ELEMENT CONTROL) NORMAL LIQD      as directed.           . Blood Glucose Monitoring Suppl (ELEMENT AUTOCODE SYSTEM) W/DEVICE KIT                . carvedilol (COREG) 12.5 MG tablet   Oral   Take 1 tablet (12.5 mg total) by mouth 2 (two) times daily with a meal.   180 tablet   3   . esomeprazole (NEXIUM) 40 MG capsule   Oral   Take 1 capsule (40 mg total) by  mouth daily before breakfast.   90 capsule   3   . glucose blood (ELEMENT TEST) test strip   Other   1 each by Other route as needed. Use as instructed          . HYDROcodone-acetaminophen (NORCO) 10-325 MG per tablet      TAKE 1  TABLET BY MOUTH EVERY 4 HOURS AS NEEDED FOR PAIN with limit 4 per day   100 tablet   0   . Na Sulfate-K Sulfate-Mg Sulf (SUPREP BOWEL PREP) SOLN      Use as directed   2 Bottle   0   . omega-3 acid ethyl esters (LOVAZA) 1 G capsule   Oral   Take 2 capsules (2 g total) by mouth 2 (two) times daily.   120 capsule   11   . PARoxetine (PAXIL) 30 MG tablet   Oral   Take 1 tablet (30 mg total) by mouth every morning.   90 tablet   3   . Pitavastatin Calcium (LIVALO) 2 MG TABS   Oral   Take 1 tablet (2 mg total) by mouth daily.   90 tablet   3   . tiotropium (SPIRIVA) 18 MCG inhalation capsule   Inhalation   Place 1 capsule (18 mcg total) into inhaler and inhale daily.   90 capsule   3   . traZODone (DESYREL) 100 MG tablet      3 TABS QHS   90 tablet   11    BP 131/98  Pulse 89  Temp(Src) 98 F (36.7 C) (Oral)  Resp 16  SpO2 100% Physical Exam  Nursing note and vitals reviewed. Constitutional: He is oriented to person, place, and time. He appears well-developed and well-nourished.  HENT:  Head: Normocephalic.  Cardiovascular: Normal rate.   Pulmonary/Chest: Effort normal.  Abdominal: Soft.  Musculoskeletal: He exhibits tenderness.  Tender right shoulder, diffusely tender c spine  Neurological: He is alert and oriented to person, place, and time. He has normal reflexes.  Skin: Skin is warm.  Psychiatric: He has a normal mood and affect.    ED Course  Procedures (including critical care time) Labs Review Labs Reviewed - No data to display Imaging Review Dg Cervical Spine Complete  08/08/2013   CLINICAL DATA:  Extremity pain.  EXAM: CERVICAL SPINE  4+  VIEWS  COMPARISON:  None.  FINDINGS: There is no evidence of cervical  spine fracture or prevertebral soft tissue swelling. Alignment is normal. No other significant bone abnormalities are identified.  IMPRESSION: Negative cervical spine radiographs.   Electronically Signed   By: Charlett Nose M.D.   On: 08/08/2013 13:59   Dg Shoulder Right  08/08/2013   CLINICAL DATA:  52 year old male with right shoulder pain. Extremity pain. Initial encounter.  EXAM: RIGHT SHOULDER - 2+ VIEW  COMPARISON:  None.  FINDINGS: Bone mineralization is within normal limits. No glenohumeral joint dislocation. Proximal right humerus intact. Visible right clavicle intact. No scapula fracture identified. Visible right ribs appear intact.  IMPRESSION: No acute osseous abnormality identified at the right shoulder   Electronically Signed   By: Augusto Gamble M.D.   On: 08/08/2013 13:41    EKG Interpretation     Ventricular Rate:    PR Interval:    QRS Duration:   QT Interval:    QTC Calculation:   R Axis:     Text Interpretation:              MDM   1. Neck pain   2. Shoulder pain, right    Pt advised to follow up with his Md or orthopaedist for recheck.   Ice to area of pain.   meloxicam     Elson Areas, PA-C 08/08/13 1428  Lonia Skinner [place], PA-C 08/08/13 1431

## 2013-08-08 NOTE — ED Provider Notes (Signed)
Medical screening examination/treatment/procedure(s) were performed by non-physician practitioner and as supervising physician I was immediately available for consultation/collaboration.  Leslee Home, M.D.  Reuben Likes, MD 08/08/13 [phone]

## 2013-08-08 NOTE — ED Notes (Signed)
Pt      Reports pain       r   Arm  From  Shoulder  Down  With  Weakness  Present  And  Tingling  In  Hand   -   denys  Any  Injury  denys  Any  Chest pain  Or  Shortness  Of  Breath

## 2013-08-14 ENCOUNTER — Ambulatory Visit (INDEPENDENT_AMBULATORY_CARE_PROVIDER_SITE_OTHER): Payer: 59 | Admitting: Internal Medicine

## 2013-08-14 ENCOUNTER — Encounter: Payer: Self-pay | Admitting: Internal Medicine

## 2013-08-14 VITALS — BP 122/80 | HR 86 | Temp 98.5°F | Resp 16 | Ht 75.0 in | Wt 198.0 lb

## 2013-08-14 DIAGNOSIS — M545 Low back pain, unspecified: Secondary | ICD-10-CM

## 2013-08-14 DIAGNOSIS — R200 Anesthesia of skin: Secondary | ICD-10-CM | POA: Insufficient documentation

## 2013-08-14 DIAGNOSIS — I10 Essential (primary) hypertension: Secondary | ICD-10-CM

## 2013-08-14 DIAGNOSIS — M1710 Unilateral primary osteoarthritis, unspecified knee: Secondary | ICD-10-CM

## 2013-08-14 DIAGNOSIS — R209 Unspecified disturbances of skin sensation: Secondary | ICD-10-CM

## 2013-08-14 DIAGNOSIS — IMO0002 Reserved for concepts with insufficient information to code with codable children: Secondary | ICD-10-CM

## 2013-08-14 DIAGNOSIS — M179 Osteoarthritis of knee, unspecified: Secondary | ICD-10-CM

## 2013-08-14 MED ORDER — HYDROCODONE-ACETAMINOPHEN 10-325 MG PO TABS: ORAL_TABLET | ORAL | Status: DC

## 2013-08-14 NOTE — Assessment & Plan Note (Signed)
He will continue norco as needed for pain 

## 2013-08-14 NOTE — Assessment & Plan Note (Signed)
He has a very diminished DTR in his RUE, I have ordered a NCS/EMG to find out where the source of his complaint is located (neck, brachial plexus, etc.)

## 2013-08-14 NOTE — Progress Notes (Signed)
Subjective:    Patient ID: [name], male    DOB: 1961/08/23, 52 y.o.   MRN: [id number]  Arthritis Presents for follow-up visit. He complains of pain. He reports no stiffness, joint swelling or joint warmth. Associated symptoms include pain at night and pain while resting. Pertinent negatives include no diarrhea, dry eyes, dry mouth, dysuria, fatigue, fever, rash, Raynaud's syndrome, uveitis or weight loss. His pertinent risk factors include overuse. Past treatments include an opioid. The treatment provided significant relief. Factors aggravating his arthritis include activity. Compliance with prior treatments has been good.      Review of Systems  Constitutional: Negative.  Negative for fever, chills, weight loss, diaphoresis, appetite change and fatigue.  HENT: Negative.   Eyes: Negative.   Respiratory: Negative.  Negative for cough, chest tightness, shortness of breath, wheezing and stridor.   Cardiovascular: Negative.  Negative for chest pain, palpitations and leg swelling.  Gastrointestinal: Negative.  Negative for nausea, vomiting, abdominal pain, diarrhea, constipation and blood in stool.  Endocrine: Negative.   Genitourinary: Negative.  Negative for dysuria.  Musculoskeletal: Positive for arthralgias, arthritis and back pain. Negative for gait problem, joint swelling, myalgias, neck pain, neck stiffness and stiffness.  Skin: Negative.  Negative for rash.  Allergic/Immunologic: Negative.   Neurological: Positive for numbness (he complains of numbness and tinlging in his right arm for several weeks). Negative for dizziness, tremors, seizures, syncope, facial asymmetry, speech difficulty, weakness, light-headedness and headaches.  Hematological: Negative.  Negative for adenopathy. Does not bruise/bleed easily.  Psychiatric/Behavioral: Negative.        Objective:   Physical Exam  Vitals reviewed. Constitutional: He is oriented to person, place, and time. He appears  well-developed and well-nourished. No distress.  HENT:  Head: Normocephalic and atraumatic.  Mouth/Throat: Oropharynx is clear and moist. No oropharyngeal exudate.  Eyes: Conjunctivae are normal. Right eye exhibits no discharge. Left eye exhibits no discharge. No scleral icterus.  Neck: Normal range of motion. Neck supple. No JVD present. No tracheal deviation present. No thyromegaly present.  Cardiovascular: Normal rate, regular rhythm, normal heart sounds and intact distal pulses.  Exam reveals no gallop and no friction rub.   No murmur heard. Pulmonary/Chest: Effort normal and breath sounds normal. No stridor. No respiratory distress. He has no wheezes. He has no rales. He exhibits no tenderness.  Abdominal: Soft. Bowel sounds are normal. He exhibits no distension and no mass. There is no tenderness. There is no rebound and no guarding.  Musculoskeletal: Normal range of motion. He exhibits no edema and no tenderness.  Lymphadenopathy:    He has no cervical adenopathy.  Neurological: He is alert and oriented to person, place, and time. He has normal strength. He displays abnormal reflex. He displays no atrophy and no tremor. No cranial nerve deficit or sensory deficit. He exhibits normal muscle tone. He displays a negative Romberg sign. He displays no seizure activity. Coordination and gait normal. He displays no Babinski's sign on the right side. He displays no Babinski's sign on the left side.  Reflex Scores:      Tricep reflexes are 0 on the right side and 2+ on the left side.      Bicep reflexes are 0 on the right side and 2+ on the left side.      Brachioradialis reflexes are 1+ on the right side and 1+ on the left side.      Patellar reflexes are 1+ on the right side and 1+ on the left side.  Achilles reflexes are 1+ on the right side and 1+ on the left side. Skin: Skin is warm and dry. No rash noted. He is not diaphoretic. No erythema. No pallor.  Psychiatric: He has a normal mood  and affect. His behavior is normal. Judgment and thought content normal.     Lab Results  Component Value Date   WBC 8.8 06/06/2013   HGB 16.5 06/06/2013   HCT 48.0 06/06/2013   PLT 113.0* 06/06/2013   GLUCOSE 103* 06/06/2013   CHOL 146 06/06/2013   TRIG 112.0 06/06/2013   HDL 28.60* 06/06/2013   LDLDIRECT 71.2 09/29/2011   LDLCALC 95 06/06/2013   ALT 21 06/06/2013   AST 16 06/06/2013   NA 139 06/06/2013   K 4.2 06/06/2013   CL 109 06/06/2013   CREATININE 0.9 06/06/2013   BUN 8 06/06/2013   CO2 27 06/06/2013   TSH 0.68 06/06/2013   PSA 0.60 06/04/2012   INR 1.0 12/19/2008   HGBA1C 6.2 06/06/2013   MICROALBUR 0.2 10/08/2008       Assessment & Plan:

## 2013-08-14 NOTE — Patient Instructions (Signed)
Osteoarthritis Osteoarthritis is the most common form of arthritis. It is redness, soreness, and swelling (inflammation) affecting the cartilage. Cartilage acts as a cushion, covering the ends of bones where they meet to form a joint. CAUSES  Over time, the cartilage begins to wear away. This causes bone to rub on bone. This produces pain and stiffness in the affected joints. Factors that contribute to this problem are:  Excessive body weight.  Age.  Overuse of joints. SYMPTOMS   People with osteoarthritis usually experience joint pain, swelling, or stiffness.  Over time, the joint may lose its normal shape.  Small deposits of bone (osteophytes) may grow on the edges of the joint.  Bits of bone or cartilage can break off and float inside the joint space. This may cause more pain and damage.  Osteoarthritis can lead to depression, anxiety, feelings of helplessness, and limitations on daily activities. The most commonly affected joints are in the:  Ends of the fingers.  Thumbs.  Neck.  Lower back.  Knees.  Hips. DIAGNOSIS  Diagnosis is mostly based on your symptoms and exam. Tests may be helpful, including:  X-rays of the affected joint.  A computerized magnetic scan (MRI).  Blood tests to rule out other types of arthritis.  Joint fluid tests. This involves using a needle to draw fluid from the joint and examining the fluid under a microscope. TREATMENT  Goals of treatment are to control pain, improve joint function, maintain a normal body weight, and maintain a healthy lifestyle. Treatment approaches may include:  A prescribed exercise program with rest and joint relief.  Weight control with nutritional education.  Pain relief techniques such as:  Properly applied heat and cold.  Electric pulses delivered to nerve endings under the skin (transcutaneous electrical nerve stimulation, TENS).  Massage.  Certain supplements. Ask your caregiver before using any  supplements, especially in combination with prescribed drugs.  Medicines to control pain, such as:  Acetaminophen.  Nonsteroidal anti-inflammatory drugs (NSAIDs), such as naproxen.  Narcotic or central-acting agents, such as tramadol. This drug carries a risk of addiction and is generally prescribed for short-term use.  Corticosteroids. These can be given orally or as injection. This is a short-term treatment, not recommended for routine use.  Surgery to reposition the bones and relieve pain (osteotomy) or to remove loose pieces of bone and cartilage. Joint replacement may be needed in advanced states of osteoarthritis. HOME CARE INSTRUCTIONS  Your caregiver can recommend specific types of exercise. These may include:  Strengthening exercises. These are done to strengthen the muscles that support joints affected by arthritis. They can be performed with weights or with exercise bands to add resistance.  Aerobic activities. These are exercises, such as brisk walking or low-impact aerobics, that get your heart pumping. They can help keep your lungs and circulatory system in shape.  Range-of-motion activities. These keep your joints limber.  Balance and agility exercises. These help you maintain daily living skills. Learning about your condition and being actively involved in your care will help improve the course of your osteoarthritis. SEEK MEDICAL CARE IF:   You feel hot or your skin turns red.  You develop a rash in addition to your joint pain.  You have an oral temperature above 102 F (38.9 C). FOR MORE INFORMATION  National Institute of Arthritis and Musculoskeletal and Skin Diseases: www.niams.nih.gov National Institute on Aging: www.nia.nih.gov American College of Rheumatology: www.rheumatology.org Document Released: 09/12/2005 Document Revised: 12/05/2011 Document Reviewed: 12/24/2009 [employer] Patient Information 2014 [employer].  

## 2013-08-14 NOTE — Assessment & Plan Note (Signed)
His BP is well controlled 

## 2013-08-15 ENCOUNTER — Other Ambulatory Visit: Payer: Self-pay | Admitting: Internal Medicine

## 2013-08-27 ENCOUNTER — Other Ambulatory Visit: Payer: Self-pay

## 2013-08-27 NOTE — Telephone Encounter (Signed)
Patient called to check status of rx request for xanax. Per Epic, request received 08/15/13, approved 08/22/13 and never called in to the pharmacy. Rx has now been called in and pt notified.

## 2013-08-29 ENCOUNTER — Encounter: Payer: Self-pay | Admitting: Radiology

## 2013-08-29 ENCOUNTER — Telehealth: Payer: Self-pay | Admitting: Neurology

## 2013-08-29 ENCOUNTER — Encounter: Payer: Self-pay | Admitting: Neurology

## 2013-08-29 NOTE — Telephone Encounter (Signed)
The patient did not show for the EMG evaluation today.

## 2013-09-04 ENCOUNTER — Telehealth: Payer: Self-pay | Admitting: Neurology

## 2013-09-04 NOTE — Telephone Encounter (Signed)
Patient called to reschedule 12/4 no show appointment, stating that he had a death in the family. Rescheduled for 12/16.

## 2013-09-10 ENCOUNTER — Telehealth: Payer: Self-pay | Admitting: Neurology

## 2013-09-10 ENCOUNTER — Encounter: Payer: Self-pay | Admitting: Internal Medicine

## 2013-09-10 ENCOUNTER — Encounter: Payer: Self-pay | Admitting: Neurology

## 2013-09-10 NOTE — Telephone Encounter (Signed)
Gave to Angie for no more scheduled appt.

## 2013-09-10 NOTE — Telephone Encounter (Signed)
This patient has had his second no-show for EMG appointment. The patient will not be rescheduled.

## 2013-09-12 ENCOUNTER — Other Ambulatory Visit: Payer: Self-pay | Admitting: Internal Medicine

## 2013-09-16 ENCOUNTER — Telehealth: Payer: Self-pay | Admitting: *Deleted

## 2013-09-16 ENCOUNTER — Ambulatory Visit (INDEPENDENT_AMBULATORY_CARE_PROVIDER_SITE_OTHER): Payer: PRIVATE HEALTH INSURANCE | Admitting: Neurology

## 2013-09-16 ENCOUNTER — Ambulatory Visit (INDEPENDENT_AMBULATORY_CARE_PROVIDER_SITE_OTHER): Payer: Self-pay | Admitting: Radiology

## 2013-09-16 DIAGNOSIS — M79609 Pain in unspecified limb: Secondary | ICD-10-CM

## 2013-09-16 DIAGNOSIS — M1710 Unilateral primary osteoarthritis, unspecified knee: Secondary | ICD-10-CM

## 2013-09-16 DIAGNOSIS — Z0289 Encounter for other administrative examinations: Secondary | ICD-10-CM

## 2013-09-16 DIAGNOSIS — R209 Unspecified disturbances of skin sensation: Secondary | ICD-10-CM

## 2013-09-16 DIAGNOSIS — M179 Osteoarthritis of knee, unspecified: Secondary | ICD-10-CM

## 2013-09-16 DIAGNOSIS — M545 Low back pain, unspecified: Secondary | ICD-10-CM

## 2013-09-16 MED ORDER — HYDROCODONE-ACETAMINOPHEN 10-325 MG PO TABS: ORAL_TABLET | ORAL | Status: DC

## 2013-09-16 NOTE — Telephone Encounter (Signed)
He will have to come pick it up 

## 2013-09-16 NOTE — Procedures (Signed)
     HISTORY:  [name] is a 52 year old gentleman with a several week history of some shoulder discomfort, and some numbness involving the right hand. The patient has some pain down the right arm. The patient is being evaluated for possible neuropathy or a cervical radiculopathy.  NERVE CONDUCTION STUDIES:  Nerve conduction studies were performed on the right upper extremity. The distal motor latencies and motor amplitudes for the median and ulnar nerves were within normal limits. The F wave latencies and nerve conduction velocities for these nerves were also normal. The sensory latencies for the median, radial, and ulnar nerves were normal.   EMG STUDIES:  EMG study was performed on the right upper extremity:  The first dorsal interosseous muscle reveals 2 to 4 K units with full recruitment. No fibrillations or positive waves were noted. The abductor pollicis brevis muscle reveals 2 to 4 K units with full recruitment. No fibrillations or positive waves were noted. The extensor indicis proprius muscle reveals 1 to 3 K units with full recruitment. No fibrillations or positive waves were noted. The pronator teres muscle reveals 2 to 3 K units with full recruitment. No fibrillations or positive waves were noted. The biceps muscle reveals 1 to 2 K units with full recruitment. No fibrillations or positive waves were noted. The triceps muscle reveals 2 to 4 K units with full recruitment. No fibrillations or positive waves were noted. The anterior deltoid muscle reveals 2 to 3 K units with full recruitment. No fibrillations or positive waves were noted. The cervical paraspinal muscles were tested at 2 levels. No abnormalities of insertional activity were seen at either level tested. There was good relaxation.   IMPRESSION:  Nerve conduction studies done on the right upper extremity were unremarkable, no evidence of carpal tunnel syndrome is seen. EMG evaluation of the right upper extremity  is normal, no evidence of cervical radiculopathy is noted.  Marlan Palau MD 09/16/2013 4:14 PM  [facility] [address] [place], [place] [address]  Phone [phone] Fax [phone]

## 2013-09-16 NOTE — Telephone Encounter (Signed)
Pt called requesting Vicodin refill.  Rx is not due to be refilled until 12.26.14.  Please advise

## 2013-09-16 NOTE — Telephone Encounter (Signed)
Spoke with pt advised Rx ready. 

## 2013-10-04 ENCOUNTER — Other Ambulatory Visit: Payer: Self-pay | Admitting: Internal Medicine

## 2013-10-16 ENCOUNTER — Ambulatory Visit (INDEPENDENT_AMBULATORY_CARE_PROVIDER_SITE_OTHER): Payer: PRIVATE HEALTH INSURANCE | Admitting: Podiatry

## 2013-10-16 ENCOUNTER — Encounter: Payer: Self-pay | Admitting: Podiatry

## 2013-10-16 VITALS — BP 114/74 | HR 76 | Ht 75.0 in | Wt 198.0 lb

## 2013-10-16 DIAGNOSIS — B351 Tinea unguium: Secondary | ICD-10-CM

## 2013-10-16 DIAGNOSIS — M79609 Pain in unspecified limb: Secondary | ICD-10-CM

## 2013-10-16 DIAGNOSIS — M79673 Pain in unspecified foot: Secondary | ICD-10-CM | POA: Insufficient documentation

## 2013-10-16 DIAGNOSIS — L851 Acquired keratosis [keratoderma] palmaris et plantaris: Secondary | ICD-10-CM

## 2013-10-16 MED ORDER — CICLOPIROX 8 % EX SOLN: Freq: Every day | CUTANEOUS | Status: DC

## 2013-10-16 NOTE — Progress Notes (Addendum)
Subjective: 53 year old male presents complaining of pain on left foot callus under ball and right big toe from thick callus.  Stated that his blood sugar runs good with diet and exercise. Stated that his A1c was 5 something.  Objective: Dermatologic: Painful porokeratosis under first MPJ left, plantar medial right hallux. Thick dystrophic nails x 10. Epicritic and tactile sensations grossly intact. Mild digital contracture lesser digits 4-5 bilateral.  Assessment: Onychomycosis x 10. Painful porokeratosis sub 1 left, right hallux.  Plan: Reviewed diabetic foot care. Debrided all nails.  Penlac prescribed as per patient request.

## 2013-10-16 NOTE — Addendum Note (Signed)
Addended by: Camelia Phenes on: 10/16/2013 12:01 PM   Modules accepted: Orders

## 2013-10-16 NOTE — Patient Instructions (Signed)
Seen for painful callus and toe nails. All debrided. Return in 3 months.

## 2013-10-17 ENCOUNTER — Ambulatory Visit: Payer: 59 | Admitting: Internal Medicine

## 2013-10-18 ENCOUNTER — Encounter: Payer: Self-pay | Admitting: Internal Medicine

## 2013-10-18 ENCOUNTER — Telehealth: Payer: Self-pay | Admitting: Internal Medicine

## 2013-10-18 DIAGNOSIS — M179 Osteoarthritis of knee, unspecified: Secondary | ICD-10-CM

## 2013-10-18 DIAGNOSIS — M545 Low back pain, unspecified: Secondary | ICD-10-CM

## 2013-10-18 DIAGNOSIS — M1710 Unilateral primary osteoarthritis, unspecified knee: Secondary | ICD-10-CM

## 2013-10-18 MED ORDER — HYDROCODONE-ACETAMINOPHEN 10-325 MG PO TABS: ORAL_TABLET | ORAL | Status: DC

## 2013-10-18 NOTE — Telephone Encounter (Signed)
Spoke with pt advised Rx ready.

## 2013-10-18 NOTE — Telephone Encounter (Signed)
done

## 2013-10-18 NOTE — Telephone Encounter (Signed)
Pt is requesting to pick up an RX for ( norco) hydrocodone.

## 2013-10-21 ENCOUNTER — Encounter: Payer: Self-pay | Admitting: Internal Medicine

## 2013-10-21 ENCOUNTER — Ambulatory Visit (INDEPENDENT_AMBULATORY_CARE_PROVIDER_SITE_OTHER): Payer: 59 | Admitting: Internal Medicine

## 2013-10-21 ENCOUNTER — Ambulatory Visit (INDEPENDENT_AMBULATORY_CARE_PROVIDER_SITE_OTHER)
Admission: RE | Admit: 2013-10-21 | Discharge: 2013-10-21 | Disposition: A | Discharge status: Home / Self Care | Payer: 59 | Source: Ambulatory Visit | Attending: Internal Medicine | Admitting: Internal Medicine

## 2013-10-21 ENCOUNTER — Ambulatory Visit: Payer: Self-pay | Admitting: Internal Medicine

## 2013-10-21 VITALS — BP 150/90 | HR 80 | Temp 98.3°F | Resp 16 | Wt 204.0 lb

## 2013-10-21 DIAGNOSIS — M25551 Pain in right hip: Secondary | ICD-10-CM

## 2013-10-21 DIAGNOSIS — M545 Low back pain, unspecified: Secondary | ICD-10-CM

## 2013-10-21 DIAGNOSIS — M25559 Pain in unspecified hip: Secondary | ICD-10-CM

## 2013-10-21 NOTE — Assessment & Plan Note (Signed)
The plain film shows mild DJD - will cont the current meds for pain He has stopped taking nsaids due to lack of efficacy and concern over a recurrence of PUDz

## 2013-10-21 NOTE — Progress Notes (Signed)
Pre visit review using our clinic review tool, if applicable. No additional management support is needed unless otherwise documented below in the visit note. 

## 2013-10-21 NOTE — Patient Instructions (Signed)
Back Pain, Adult Low back pain is very common. About 1 in 5 people have back pain.The cause of low back pain is rarely dangerous. The pain often gets better over time.About half of people with a sudden onset of back pain feel better in just 2 weeks. About 8 in 10 people feel better by 6 weeks.  CAUSES Some common causes of back pain include:  Strain of the muscles or ligaments supporting the spine.  Wear and tear (degeneration) of the spinal discs.  Arthritis.  Direct injury to the back. DIAGNOSIS Most of the time, the direct cause of low back pain is not known.However, back pain can be treated effectively even when the exact cause of the pain is unknown.Answering your caregiver's questions about your overall health and symptoms is one of the most accurate ways to make sure the cause of your pain is not dangerous. If your caregiver needs more information, he or she may order lab work or imaging tests (X-rays or MRIs).However, even if imaging tests show changes in your back, this usually does not require surgery. HOME CARE INSTRUCTIONS For many people, back pain returns.Since low back pain is rarely dangerous, it is often a condition that people can learn to manageon their own.   Remain active. It is stressful on the back to sit or stand in one place. Do not sit, drive, or stand in one place for more than 30 minutes at a time. Take short walks on level surfaces as soon as pain allows.Try to increase the length of time you walk each day.  Do not stay in bed.Resting more than 1 or 2 days can delay your recovery.  Do not avoid exercise or work.Your body is made to move.It is not dangerous to be active, even though your back may hurt.Your back will likely heal faster if you return to being active before your pain is gone.  Pay attention to your body when you bend and lift. Many people have less discomfortwhen lifting if they bend their knees, keep the load close to their bodies,and  avoid twisting. Often, the most comfortable positions are those that put less stress on your recovering back.  Find a comfortable position to sleep. Use a firm mattress and lie on your side with your knees slightly bent. If you lie on your back, put a pillow under your knees.  Only take over-the-counter or prescription medicines as directed by your caregiver. Over-the-counter medicines to reduce pain and inflammation are often the most helpful.Your caregiver may prescribe muscle relaxant drugs.These medicines help dull your pain so you can more quickly return to your normal activities and healthy exercise.  Put ice on the injured area.  Put ice in a plastic bag.  Place a towel between your skin and the bag.  Leave the ice on for 15-20 minutes, 03-04 times a day for the first 2 to 3 days. After that, ice and heat may be alternated to reduce pain and spasms.  Ask your caregiver about trying back exercises and gentle massage. This may be of some benefit.  Avoid feeling anxious or stressed.Stress increases muscle tension and can worsen back pain.It is important to recognize when you are anxious or stressed and learn ways to manage it.Exercise is a great option. SEEK MEDICAL CARE IF:  You have pain that is not relieved with rest or medicine.  You have pain that does not improve in 1 week.  You have new symptoms.  You are generally not feeling well. SEEK   IMMEDIATE MEDICAL CARE IF:   You have pain that radiates from your back into your legs.  You develop new bowel or bladder control problems.  You have unusual weakness or numbness in your arms or legs.  You develop nausea or vomiting.  You develop abdominal pain.  You feel faint. Document Released: 09/12/2005 Document Revised: 03/13/2012 Document Reviewed: 01/31/2011 [employer] Patient Information 2014 [employer].  

## 2013-10-21 NOTE — Assessment & Plan Note (Signed)
His plain films are unremarkable, he will cont the current meds for pain and will recheck in 1-2 months and will consider getting an MRI done to look for HNP, nerve impingement, etc.

## 2013-10-21 NOTE — Progress Notes (Signed)
Subjective:    Patient ID: [name], male    DOB: 10-28-1960, 53 y.o.   MRN: [id number]  Back Pain This is a recurrent problem. The current episode started more than 1 year ago. The problem occurs intermittently. The problem has been gradually worsening since onset. The pain is present in the lumbar spine. The quality of the pain is described as aching. The pain radiates to the right thigh. The pain is at a severity of 6/10. The pain is moderate. The pain is worse during the day. The symptoms are aggravated by position and bending. Stiffness is present all day. Associated symptoms include paresthesias (tingling and weakness in the RLE), tingling and weakness. Pertinent negatives include no abdominal pain, bladder incontinence, bowel incontinence, chest pain, dysuria, fever, headaches, leg pain, numbness, paresis, pelvic pain, perianal numbness or weight loss. Risk factors include obesity and lack of exercise. He has tried analgesics and home exercises for the symptoms. The treatment provided mild relief.      Review of Systems  Constitutional: Negative for fever, chills, weight loss, diaphoresis, appetite change and fatigue.  HENT: Negative.   Eyes: Negative.   Respiratory: Negative.  Negative for cough, choking, chest tightness, shortness of breath, wheezing and stridor.   Cardiovascular: Negative.  Negative for chest pain, palpitations and leg swelling.  Gastrointestinal: Negative.  Negative for nausea, vomiting, abdominal pain, diarrhea, constipation and bowel incontinence.  Endocrine: Negative.   Genitourinary: Negative.  Negative for bladder incontinence, dysuria and pelvic pain.  Musculoskeletal: Positive for arthralgias (right hip pain) and back pain. Negative for gait problem, joint swelling, myalgias, neck pain and neck stiffness.  Skin: Negative.   Allergic/Immunologic: Negative.   Neurological: Positive for tingling, weakness and paresthesias (tingling and weakness in the  RLE). Negative for dizziness, tremors, numbness and headaches.  Hematological: Negative.  Negative for adenopathy. Does not bruise/bleed easily.  Psychiatric/Behavioral: Negative.        Objective:   Physical Exam  Vitals reviewed. Constitutional: He is oriented to person, place, and time. He appears well-developed and well-nourished. No distress.  HENT:  Head: Normocephalic and atraumatic.  Mouth/Throat: Oropharynx is clear and moist. No oropharyngeal exudate.  Eyes: Conjunctivae are normal. Right eye exhibits no discharge. Left eye exhibits no discharge. No scleral icterus.  Neck: Normal range of motion. Neck supple. No JVD present. No tracheal deviation present. No thyromegaly present.  Cardiovascular: Normal rate, regular rhythm, normal heart sounds and intact distal pulses.  Exam reveals no gallop and no friction rub.   No murmur heard. Pulmonary/Chest: No stridor. No respiratory distress. He has no wheezes. He has no rales. He exhibits no tenderness.  Abdominal: Soft. Bowel sounds are normal. He exhibits no distension and no mass. There is no tenderness. There is no rebound and no guarding.  Musculoskeletal: Normal range of motion. He exhibits no edema and no tenderness.       Lumbar back: Normal. He exhibits normal range of motion, no tenderness, no bony tenderness, no swelling, no edema, no deformity, no laceration, no pain, no spasm and normal pulse.  Lymphadenopathy:    He has no cervical adenopathy.  Neurological: He is alert and oriented to person, place, and time. He has normal strength. He displays no atrophy, no tremor and normal reflexes. No cranial nerve deficit or sensory deficit. He exhibits normal muscle tone. He displays a negative Romberg sign. He displays no seizure activity. Coordination and gait normal. He displays no Babinski's sign on the right side. He displays  no Babinski's sign on the left side.  Reflex Scores:      Tricep reflexes are 1+ on the right side and  1+ on the left side.      Bicep reflexes are 1+ on the right side and 1+ on the left side.      Brachioradialis reflexes are 1+ on the right side and 1+ on the left side.      Patellar reflexes are 1+ on the right side and 1+ on the left side.      Achilles reflexes are 1+ on the right side and 1+ on the left side. +SLR in RLE  Skin: Skin is warm and dry. No rash noted. He is not diaphoretic. No erythema. No pallor.  Psychiatric: He has a normal mood and affect. His behavior is normal. Judgment and thought content normal.     Lab Results  Component Value Date   WBC 8.8 06/06/2013   HGB 16.5 06/06/2013   HCT 48.0 06/06/2013   PLT 113.0* 06/06/2013   GLUCOSE 103* 06/06/2013   CHOL 146 06/06/2013   TRIG 112.0 06/06/2013   HDL 28.60* 06/06/2013   LDLDIRECT 71.2 09/29/2011   LDLCALC 95 06/06/2013   ALT 21 06/06/2013   AST 16 06/06/2013   NA 139 06/06/2013   K 4.2 06/06/2013   CL 109 06/06/2013   CREATININE 0.9 06/06/2013   BUN 8 06/06/2013   CO2 27 06/06/2013   TSH 0.68 06/06/2013   PSA 0.60 06/04/2012   INR 1.0 12/19/2008   HGBA1C 6.2 06/06/2013   MICROALBUR 0.2 10/08/2008       Assessment & Plan:

## 2013-11-01 ENCOUNTER — Telehealth: Payer: Self-pay

## 2013-11-01 DIAGNOSIS — M25551 Pain in right hip: Secondary | ICD-10-CM

## 2013-11-01 NOTE — Telephone Encounter (Signed)
Referral done

## 2013-11-01 NOTE — Telephone Encounter (Signed)
Patient called [facility] c/o continued R hip and leg pain. He is requesting a referral a specialist and/or MRI. Thanks

## 2013-11-01 NOTE — Telephone Encounter (Signed)
[facility] to notify

## 2013-11-06 ENCOUNTER — Telehealth: Payer: Self-pay | Admitting: *Deleted

## 2013-11-06 DIAGNOSIS — M545 Low back pain, unspecified: Secondary | ICD-10-CM

## 2013-11-06 DIAGNOSIS — M1710 Unilateral primary osteoarthritis, unspecified knee: Secondary | ICD-10-CM

## 2013-11-06 DIAGNOSIS — M179 Osteoarthritis of knee, unspecified: Secondary | ICD-10-CM

## 2013-11-06 MED ORDER — HYDROCODONE-ACETAMINOPHEN 10-325 MG PO TABS: ORAL_TABLET | ORAL | Status: DC

## 2013-11-06 NOTE — Telephone Encounter (Signed)
Patient phoned requesting refill for his norco.  Last OV with PCP 10/21/13 and last filled 10/18/13  Please advise.   CB# [phone]

## 2013-11-06 NOTE — Telephone Encounter (Signed)
done

## 2013-11-06 NOTE — Telephone Encounter (Signed)
Phoned and notified patient that script would be available for p/u tomorrow.

## 2013-12-05 ENCOUNTER — Encounter: Payer: Self-pay | Admitting: Internal Medicine

## 2013-12-05 ENCOUNTER — Ambulatory Visit (INDEPENDENT_AMBULATORY_CARE_PROVIDER_SITE_OTHER): Payer: 59 | Admitting: Internal Medicine

## 2013-12-05 ENCOUNTER — Other Ambulatory Visit (INDEPENDENT_AMBULATORY_CARE_PROVIDER_SITE_OTHER): Payer: 59

## 2013-12-05 VITALS — BP 120/74 | HR 80 | Temp 98.0°F | Resp 16 | Ht 75.0 in | Wt 192.2 lb

## 2013-12-05 DIAGNOSIS — E782 Mixed hyperlipidemia: Secondary | ICD-10-CM

## 2013-12-05 DIAGNOSIS — M179 Osteoarthritis of knee, unspecified: Secondary | ICD-10-CM

## 2013-12-05 DIAGNOSIS — I10 Essential (primary) hypertension: Secondary | ICD-10-CM

## 2013-12-05 DIAGNOSIS — N401 Enlarged prostate with lower urinary tract symptoms: Secondary | ICD-10-CM

## 2013-12-05 DIAGNOSIS — Z0000 Encounter for general adult medical examination without abnormal findings: Secondary | ICD-10-CM

## 2013-12-05 DIAGNOSIS — IMO0002 Reserved for concepts with insufficient information to code with codable children: Secondary | ICD-10-CM

## 2013-12-05 DIAGNOSIS — R7309 Other abnormal glucose: Secondary | ICD-10-CM

## 2013-12-05 DIAGNOSIS — M545 Low back pain, unspecified: Secondary | ICD-10-CM

## 2013-12-05 DIAGNOSIS — N138 Other obstructive and reflux uropathy: Secondary | ICD-10-CM

## 2013-12-05 DIAGNOSIS — D696 Thrombocytopenia, unspecified: Secondary | ICD-10-CM

## 2013-12-05 DIAGNOSIS — M1710 Unilateral primary osteoarthritis, unspecified knee: Secondary | ICD-10-CM

## 2013-12-05 LAB — LIPID PANEL
CHOLESTEROL: 166 mg/dL (ref 0–200)
HDL: 52.6 mg/dL (ref >39.00)
LDL CALC: 97 mg/dL (ref 0–99)
Total CHOL/HDL Ratio: 3
Triglycerides: 82 mg/dL (ref 0.0–149.0)
VLDL: 16.4 mg/dL (ref 0.0–40.0)

## 2013-12-05 LAB — COMPREHENSIVE METABOLIC PANEL
ALT: 51 U/L (ref 0–53)
AST: 48 U/L — ABNORMAL HIGH (ref 0–37)
Albumin: 3.9 g/dL (ref 3.5–5.2)
Alkaline Phosphatase: 71 U/L (ref 39–117)
BUN: 7 mg/dL (ref 6–23)
CO2: 27 meq/L (ref 19–32)
Calcium: 8.8 mg/dL (ref 8.4–10.5)
Chloride: 111 mEq/L (ref 96–112)
Creatinine, Ser: 0.8 mg/dL (ref 0.4–1.5)
GFR: 137.53 mL/min (ref >60.00)
GLUCOSE: 110 mg/dL — AB (ref 70–99)
POTASSIUM: 4.8 meq/L (ref 3.5–5.1)
Sodium: 144 mEq/L (ref 135–145)
TOTAL PROTEIN: 7.3 g/dL (ref 6.0–8.3)
Total Bilirubin: 0.7 mg/dL (ref 0.3–1.2)

## 2013-12-05 LAB — CBC WITH DIFFERENTIAL/PLATELET
Basophils Absolute: 0 10*3/uL (ref 0.0–0.1)
Basophils Relative: 0.4 % (ref 0.0–3.0)
Eosinophils Absolute: 0.1 10*3/uL (ref 0.0–0.7)
Eosinophils Relative: 2.2 % (ref 0.0–5.0)
HCT: 52 % (ref 39.0–52.0)
Hemoglobin: 17.7 g/dL — ABNORMAL HIGH (ref 13.0–17.0)
LYMPHS ABS: 1.9 10*3/uL (ref 0.7–4.0)
Lymphocytes Relative: 30.9 % (ref 12.0–46.0)
MCHC: 34.1 g/dL (ref 30.0–36.0)
MCV: 92.5 fl (ref 78.0–100.0)
MONO ABS: 0.7 10*3/uL (ref 0.1–1.0)
MONOS PCT: 11.2 % (ref 3.0–12.0)
Neutro Abs: 3.3 10*3/uL (ref 1.4–7.7)
Neutrophils Relative %: 55.3 % (ref 43.0–77.0)
PLATELETS: 130 10*3/uL — AB (ref 150.0–400.0)
RBC: 5.61 Mil/uL (ref 4.22–5.81)
RDW: 17 % — ABNORMAL HIGH (ref 11.5–14.6)
WBC: 6 10*3/uL (ref 4.5–10.5)

## 2013-12-05 LAB — URINALYSIS, ROUTINE W REFLEX MICROSCOPIC
BILIRUBIN URINE: NEGATIVE
KETONES UR: NEGATIVE
Leukocytes, UA: NEGATIVE
Nitrite: NEGATIVE
Specific Gravity, Urine: 1.025 (ref 1.000–1.030)
Total Protein, Urine: 30 — AB
UROBILINOGEN UA: 0.2 (ref 0.0–1.0)
Urine Glucose: NEGATIVE
pH: 6 (ref 5.0–8.0)

## 2013-12-05 LAB — HIV ANTIBODY (ROUTINE TESTING W REFLEX): HIV: NONREACTIVE

## 2013-12-05 LAB — HEMOGLOBIN A1C: Hgb A1c MFr Bld: 6 % (ref 4.6–6.5)

## 2013-12-05 LAB — TSH: TSH: 0.94 u[IU]/mL (ref 0.35–5.50)

## 2013-12-05 LAB — PSA: PSA: 0.69 ng/mL (ref 0.10–4.00)

## 2013-12-05 MED ORDER — HYDROCODONE-ACETAMINOPHEN 10-325 MG PO TABS: ORAL_TABLET | ORAL | Status: DC

## 2013-12-05 NOTE — Progress Notes (Signed)
Subjective:    Patient ID: [name], male    DOB: 11-Feb-1961, 53 y.o.   MRN: [id number]  Hyperlipidemia This is a chronic problem. The current episode started more than 1 year ago. The problem is controlled. Recent lipid tests were reviewed and are variable. Exacerbating diseases include liver disease. He has no history of chronic renal disease, diabetes, hypothyroidism, obesity or nephrotic syndrome. Factors aggravating his hyperlipidemia include smoking and fatty foods. Pertinent negatives include no chest pain, focal sensory loss, focal weakness, leg pain, myalgias or shortness of breath. Current antihyperlipidemic treatment includes statins. The current treatment provides moderate improvement of lipids. There are no compliance problems.       Review of Systems  Constitutional: Negative for fever, chills, diaphoresis, appetite change and fatigue.  HENT: Negative.   Eyes: Negative.   Respiratory: Negative.  Negative for cough, choking, chest tightness, shortness of breath and stridor.   Cardiovascular: Negative.  Negative for chest pain, palpitations and leg swelling.  Gastrointestinal: Negative.  Negative for nausea, vomiting, abdominal pain, diarrhea, constipation and blood in stool.  Endocrine: Negative.   Genitourinary: Negative.   Musculoskeletal: Positive for arthralgias and back pain. Negative for gait problem, joint swelling, myalgias, neck pain and neck stiffness.  Skin: Negative.   Allergic/Immunologic: Negative.   Neurological: Negative.  Negative for dizziness, tremors, focal weakness, weakness, light-headedness, numbness and headaches.  Hematological: Negative.  Negative for adenopathy. Does not bruise/bleed easily.  Psychiatric/Behavioral: Positive for sleep disturbance. Negative for suicidal ideas, hallucinations, behavioral problems, confusion, self-injury, dysphoric mood, decreased concentration and agitation. The patient is nervous/anxious. The patient is not  hyperactive.        Objective:   Physical Exam  Vitals reviewed. Constitutional: He is oriented to person, place, and time. He appears well-developed and well-nourished. No distress.  HENT:  Head: Normocephalic and atraumatic.  Mouth/Throat: Oropharynx is clear and moist. No oropharyngeal exudate.  Eyes: Conjunctivae are normal. Right eye exhibits no discharge. Left eye exhibits no discharge. No scleral icterus.  Neck: Normal range of motion. Neck supple. No JVD present. No tracheal deviation present. No thyromegaly present.  Cardiovascular: Normal rate, regular rhythm, normal heart sounds and intact distal pulses.  Exam reveals no gallop and no friction rub.   No murmur heard. Pulmonary/Chest: Effort normal and breath sounds normal. No stridor. No respiratory distress. He has no wheezes. He has no rales. He exhibits no tenderness.  Abdominal: Soft. Bowel sounds are normal. He exhibits no distension and no mass. There is no tenderness. There is no rebound and no guarding. Hernia confirmed negative in the right inguinal area and confirmed negative in the left inguinal area.  Genitourinary: Testes normal and penis normal. Rectal exam shows no external hemorrhoid, no internal hemorrhoid, no fissure, no mass, no tenderness and anal tone normal. Guaiac negative stool. Prostate is enlarged (1+ smooth symm BPH). Prostate is not tender. Right testis shows no mass, no swelling and no tenderness. Right testis is descended. Left testis shows no mass, no swelling and no tenderness. Left testis is descended. Uncircumcised. No phimosis, paraphimosis, hypospadias, penile erythema or penile tenderness. No discharge found.  Musculoskeletal: Normal range of motion. He exhibits no edema and no tenderness.  Lymphadenopathy:    He has no cervical adenopathy.       Right: No inguinal adenopathy present.       Left: No inguinal adenopathy present.  Neurological: He is oriented to person, place, and time.  Skin:  Skin is warm and dry. No  rash noted. He is not diaphoretic. No erythema. No pallor.  Psychiatric: He has a normal mood and affect. His behavior is normal. Judgment and thought content normal.     Lab Results  Component Value Date   WBC 8.8 06/06/2013   HGB 16.5 06/06/2013   HCT 48.0 06/06/2013   PLT 113.0* 06/06/2013   GLUCOSE 103* 06/06/2013   CHOL 146 06/06/2013   TRIG 112.0 06/06/2013   HDL 28.60* 06/06/2013   LDLDIRECT 71.2 09/29/2011   LDLCALC 95 06/06/2013   ALT 21 06/06/2013   AST 16 06/06/2013   NA 139 06/06/2013   K 4.2 06/06/2013   CL 109 06/06/2013   CREATININE 0.9 06/06/2013   BUN 8 06/06/2013   CO2 27 06/06/2013   TSH 0.68 06/06/2013   PSA 0.60 06/04/2012   INR 1.0 12/19/2008   HGBA1C 6.2 06/06/2013   MICROALBUR 0.2 10/08/2008       Assessment & Plan:

## 2013-12-05 NOTE — Progress Notes (Signed)
Pre visit review using our clinic review tool, if applicable. No additional management support is needed unless otherwise documented below in the visit note. 

## 2013-12-05 NOTE — Patient Instructions (Signed)
Hypercholesterolemia High Blood Cholesterol Cholesterol is a white, waxy, fat-like protein needed by your body in small amounts. The liver makes all the cholesterol you need. It is carried from the liver by the blood through the blood vessels. Deposits (plaque) may build up on blood vessel walls. This makes the arteries narrower and stiffer. Plaque increases the risk for heart attack and stroke. You cannot feel your cholesterol level even if it is very high. The only way to know is by a blood test to check your lipid (fats) levels. Once you know your cholesterol levels, you should keep a record of the test results. Work with your caregiver to to keep your levels in the desired range. WHAT THE RESULTS MEAN:  Total cholesterol is a rough measure of all the cholesterol in your blood.  LDL is the so-called bad cholesterol. This is the type that deposits cholesterol in the walls of the arteries. You want this level to be low.  HDL is the good cholesterol because it cleans the arteries and carries the LDL away. You want this level to be high.  Triglycerides are fat that the body can either burn for energy or store. High levels are closely linked to heart disease. DESIRED LEVELS:  Total cholesterol below 200.  LDL below 100 for people at risk, below 70 for very high risk.  HDL above 50 is good, above 60 is best.  Triglycerides below 150. HOW TO LOWER YOUR CHOLESTEROL:  Diet.  Choose fish or white meat chicken and [place], roasted or baked. Limit fatty cuts of red meat, fried foods, and processed meats, such as sausage and lunch meat.  Eat lots of fresh fruits and vegetables. Choose whole grains, beans, pasta, potatoes and cereals.  Use only small amounts of olive, corn or canola oils. Avoid butter, mayonnaise, shortening or palm kernel oils. Avoid foods with trans-fats.  Use skim/nonfat milk and low-fat/nonfat yogurt and cheeses. Avoid whole milk, cream, ice cream, egg yolks and cheeses.  Healthy desserts include angel food cake, gingersnaps, animal crackers, hard candy, popsicles, and low-fat/nonfat frozen yogurt. Avoid pastries, cakes, pies and cookies.  Exercise.  A regular program helps decrease LDL and raises HDL.  Helps with weight control.  Do things that increase your activity level like gardening, walking, or taking the stairs.  Medication.  May be prescribed by your caregiver to help lowering cholesterol and the risk for heart disease.  You may need medicine even if your levels are normal if you have several risk factors. HOME CARE INSTRUCTIONS   Follow your diet and exercise programs as suggested by your caregiver.  Take medications as directed.  Have blood work done when your caregiver feels it is necessary. MAKE SURE YOU:   Understand these instructions.  Will watch your condition.  Will get help right away if you are not doing well or get worse. Document Released: 09/12/2005 Document Revised: 12/05/2011 Document Reviewed: 02/28/2007 [facility] Patient Information 2014 [place]. Health Maintenance, Males A healthy lifestyle and preventative care can promote health and wellness.  Maintain regular health, dental, and eye exams.  Eat a healthy diet. Foods like vegetables, fruits, whole grains, low-fat dairy products, and lean protein foods contain the nutrients you need and are low in calories. Decrease your intake of foods high in solid fats, added sugars, and salt. Get information about a proper diet from your health care provider, if necessary.  Regular physical exercise is one of the most important things you can do for your health. Most  adults should get at least 150 minutes of moderate-intensity exercise (any activity that increases your heart rate and causes you to sweat) each week. In addition, most adults need muscle-strengthening exercises on 2 or more days a week.   Maintain a healthy weight. The body mass index (BMI) is a screening  tool to identify possible weight problems. It provides an estimate of body fat based on height and weight. Your health care provider can find your BMI and can help you achieve or maintain a healthy weight. For males 20 years and older:  A BMI below 18.5 is considered underweight.  A BMI of 18.5 to 24.9 is normal.  A BMI of 25 to 29.9 is considered overweight.  A BMI of 30 and above is considered obese.  Maintain normal blood lipids and cholesterol by exercising and minimizing your intake of saturated fat. Eat a balanced diet with plenty of fruits and vegetables. Blood tests for lipids and cholesterol should begin at age 30 and be repeated every 5 years. If your lipid or cholesterol levels are high, you are over 50, or you are at high risk for heart disease, you may need your cholesterol levels checked more frequently.Ongoing high lipid and cholesterol levels should be treated with medicines, if diet and exercise are not working.  If you smoke, find out from your health care provider how to quit. If you do not use tobacco, do not start.  Lung cancer screening is recommended for adults aged 58 80 years who are at high risk for developing lung cancer because of a history of smoking. A yearly low-dose CT scan of the lungs is recommended for people who have at least a 30-pack-year history of smoking and are a current smoker or have quit within the past 15 years. A pack year of smoking is smoking an average of 1 pack of cigarettes a day for 1 year (for example, a 30-pack-year history of smoking could mean smoking 1 pack a day for 30 years or 2 packs a day for 15 years). Yearly screening should continue until the smoker has stopped smoking for at least 15 years. Yearly screening should be stopped for people who develop a health problem that would prevent them from having lung cancer treatment.  If you choose to drink alcohol, do not have more than 2 drinks per day. One drink is considered to be 12 oz (360  mL) of beer, 5 oz (150 mL) of wine, or 1.5 oz (45 mL) of liquor.  Avoid use of street drugs. Do not share needles with anyone. Ask for help if you need support or instructions about stopping the use of drugs.  High blood pressure causes heart disease and increases the risk of stroke. Blood pressure should be checked at least every 1 2 years. Ongoing high blood pressure should be treated with medicines if weight loss and exercise are not effective.  If you are 63 54 years old, ask your health care provider if you should take aspirin to prevent heart disease.  Diabetes screening involves taking a blood sample to check your fasting blood sugar level. This should be done once every 3 years after age 49, if you are at a normal weight and without risk factors for diabetes. Testing should be considered at a younger age or be carried out more frequently if you are overweight and have at least 1 risk factor for diabetes.  Colorectal cancer can be detected and often prevented. Most routine colorectal cancer screening begins at  the age of 51 and continues through age 48. However, your health care provider may recommend screening at an earlier age if you have risk factors for colon cancer. On a yearly basis, your health care provider may provide home test kits to check for hidden blood in the stool. A small camera at the end of a tube may be used to directly examine the colon (sigmoidoscopy or colonoscopy) to detect the earliest forms of colorectal cancer. Talk to your health care provider about this at age 18, when routine screening begins. A direct exam of the colon should be repeated every 5 10 years through age 57, unless early forms of pre-cancerous polyps or small growths are found.  People who are at an increased risk for hepatitis B should be screened for this virus. You are considered at high risk for hepatitis B if:  You were born in a country where hepatitis B occurs often. Talk with your health care  provider about which countries are considered high-risk.  Your parents were born in a high-risk country and you have not received a shot to protect against hepatitis B (hepatitis B vaccine).  You have HIV or AIDS.  You use needles to inject street drugs.  You live with, or have sex with, someone who has hepatitis B.  You are a man who has sex with other men (MSM).  You get hemodialysis treatment.  You take certain medicines for conditions like cancer, organ transplantation, and autoimmune conditions.  Hepatitis C blood testing is recommended for all people born from [birth year] through 1966 and any individual with known risk factors for hepatitis C.  Healthy men should no longer receive prostate-specific antigen (PSA) blood tests as part of routine cancer screening. Talk to your health care provider about prostate cancer screening.  Testicular cancer screening is not recommended for adolescents or adult males who have no symptoms. Screening includes self-exam, a health care provider exam, and other screening tests. Consult with your health care provider about any symptoms you have or any concerns you have about testicular cancer.  Practice safe sex. Use condoms and avoid high-risk sexual practices to reduce the spread of sexually transmitted infections (STIs).  Use sunscreen. Apply sunscreen liberally and repeatedly throughout the day. You should seek shade when your shadow is shorter than you. Protect yourself by wearing long sleeves, pants, a wide-brimmed hat, and sunglasses year round, whenever you are outdoors.  Tell your health care provider of new moles or changes in moles, especially if there is a change in shape or color. Also tell your provider if a mole is larger than the size of a pencil eraser.  A one-time screening for abdominal aortic aneurysm (AAA) and surgical repair of large AAAs by ultrasound is recommended for men aged 31 75 years who are current or former smokers.  Stay  current with your vaccines (immunizations). Document Released: 03/10/2008 Document Revised: 07/03/2013 Document Reviewed: 02/07/2011 [facility] Patient Information 2014 [place], [place].

## 2013-12-06 NOTE — Assessment & Plan Note (Signed)

## 2013-12-06 NOTE — Assessment & Plan Note (Signed)
His BP is well controlled 

## 2013-12-06 NOTE — Assessment & Plan Note (Signed)
FLP today 

## 2013-12-06 NOTE — Assessment & Plan Note (Signed)
I will recheck him for HIV today

## 2013-12-06 NOTE — Assessment & Plan Note (Signed)
Will cont the current meds for pain

## 2013-12-06 NOTE — Assessment & Plan Note (Signed)
His PSA is normal and he has no s/s to address

## 2013-12-14 ENCOUNTER — Other Ambulatory Visit: Payer: Self-pay | Admitting: Internal Medicine

## 2013-12-24 IMAGING — CR DG CHEST 2V
2 series · 2 of 2 positions shown · non-contrast
Comparison: 08/02/2011

CLINICAL DATA: Right chest pain.  Emphysema.  Tobacco use.

CHEST - 2 VIEW

[view not recorded (1 of 2)]
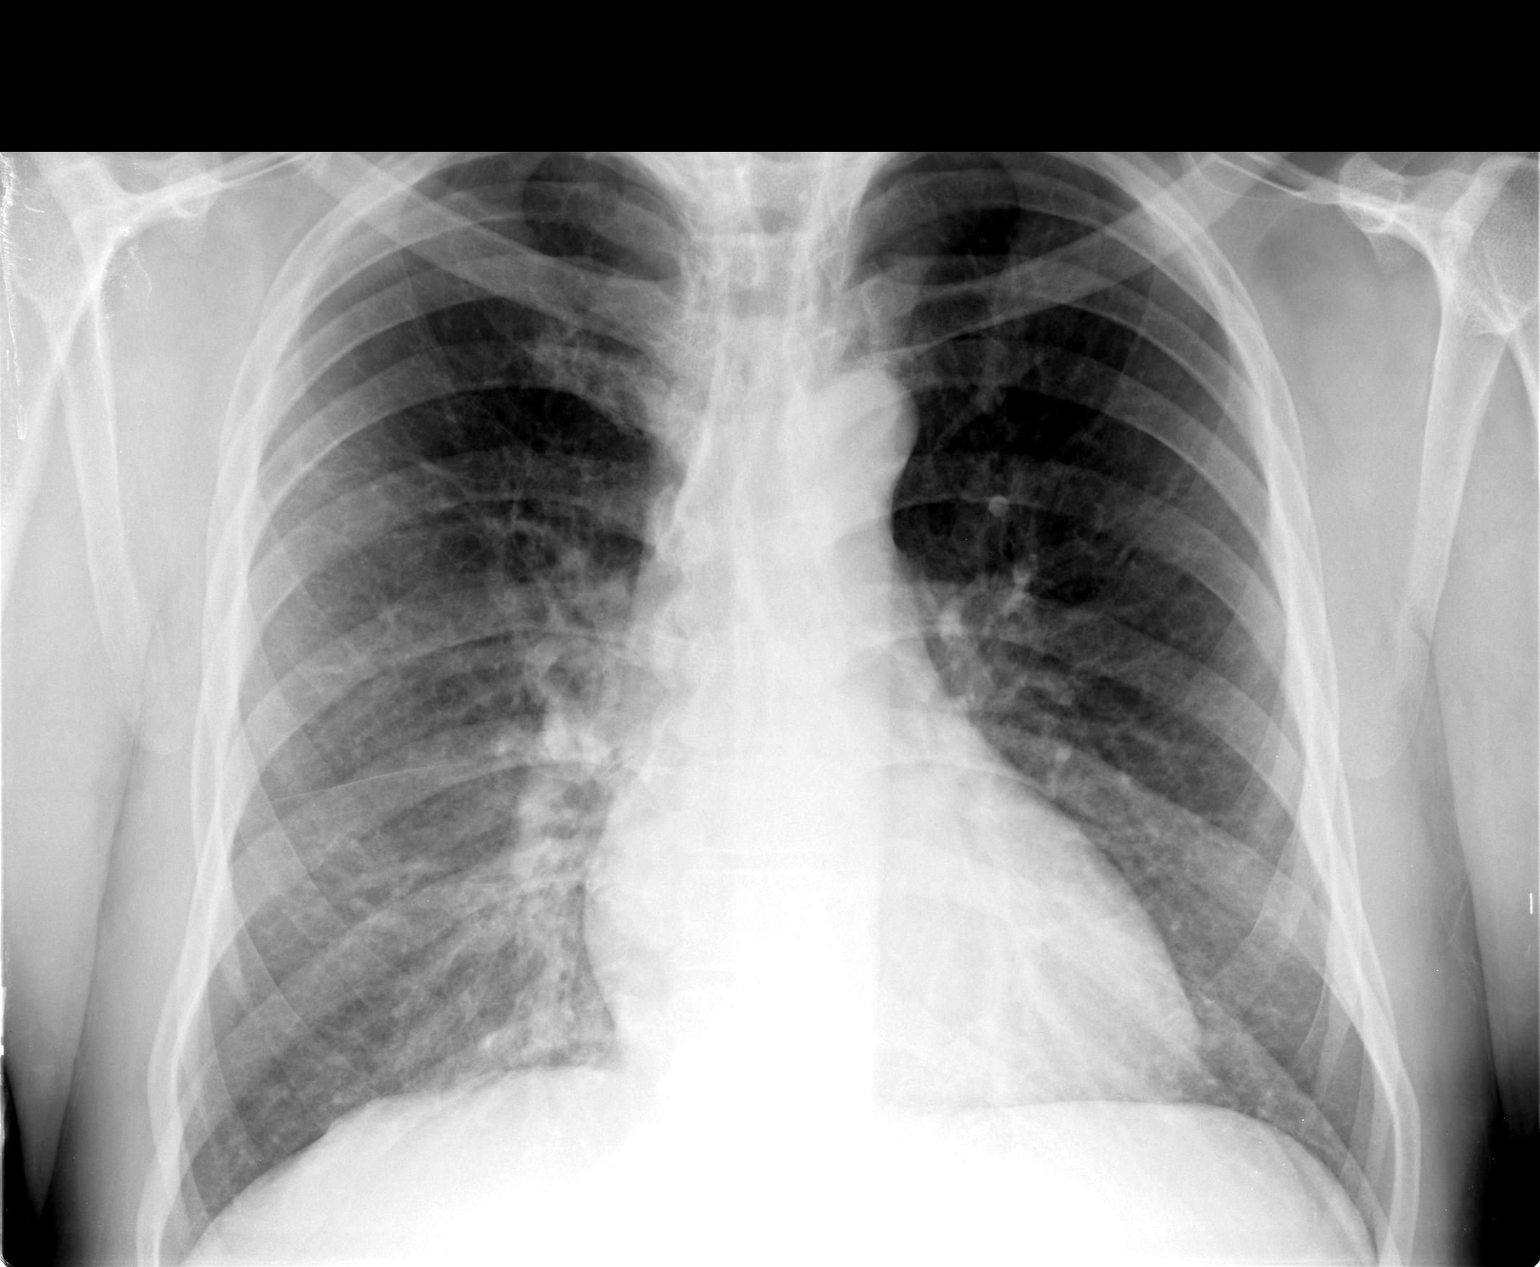

[view not recorded (2 of 2)]
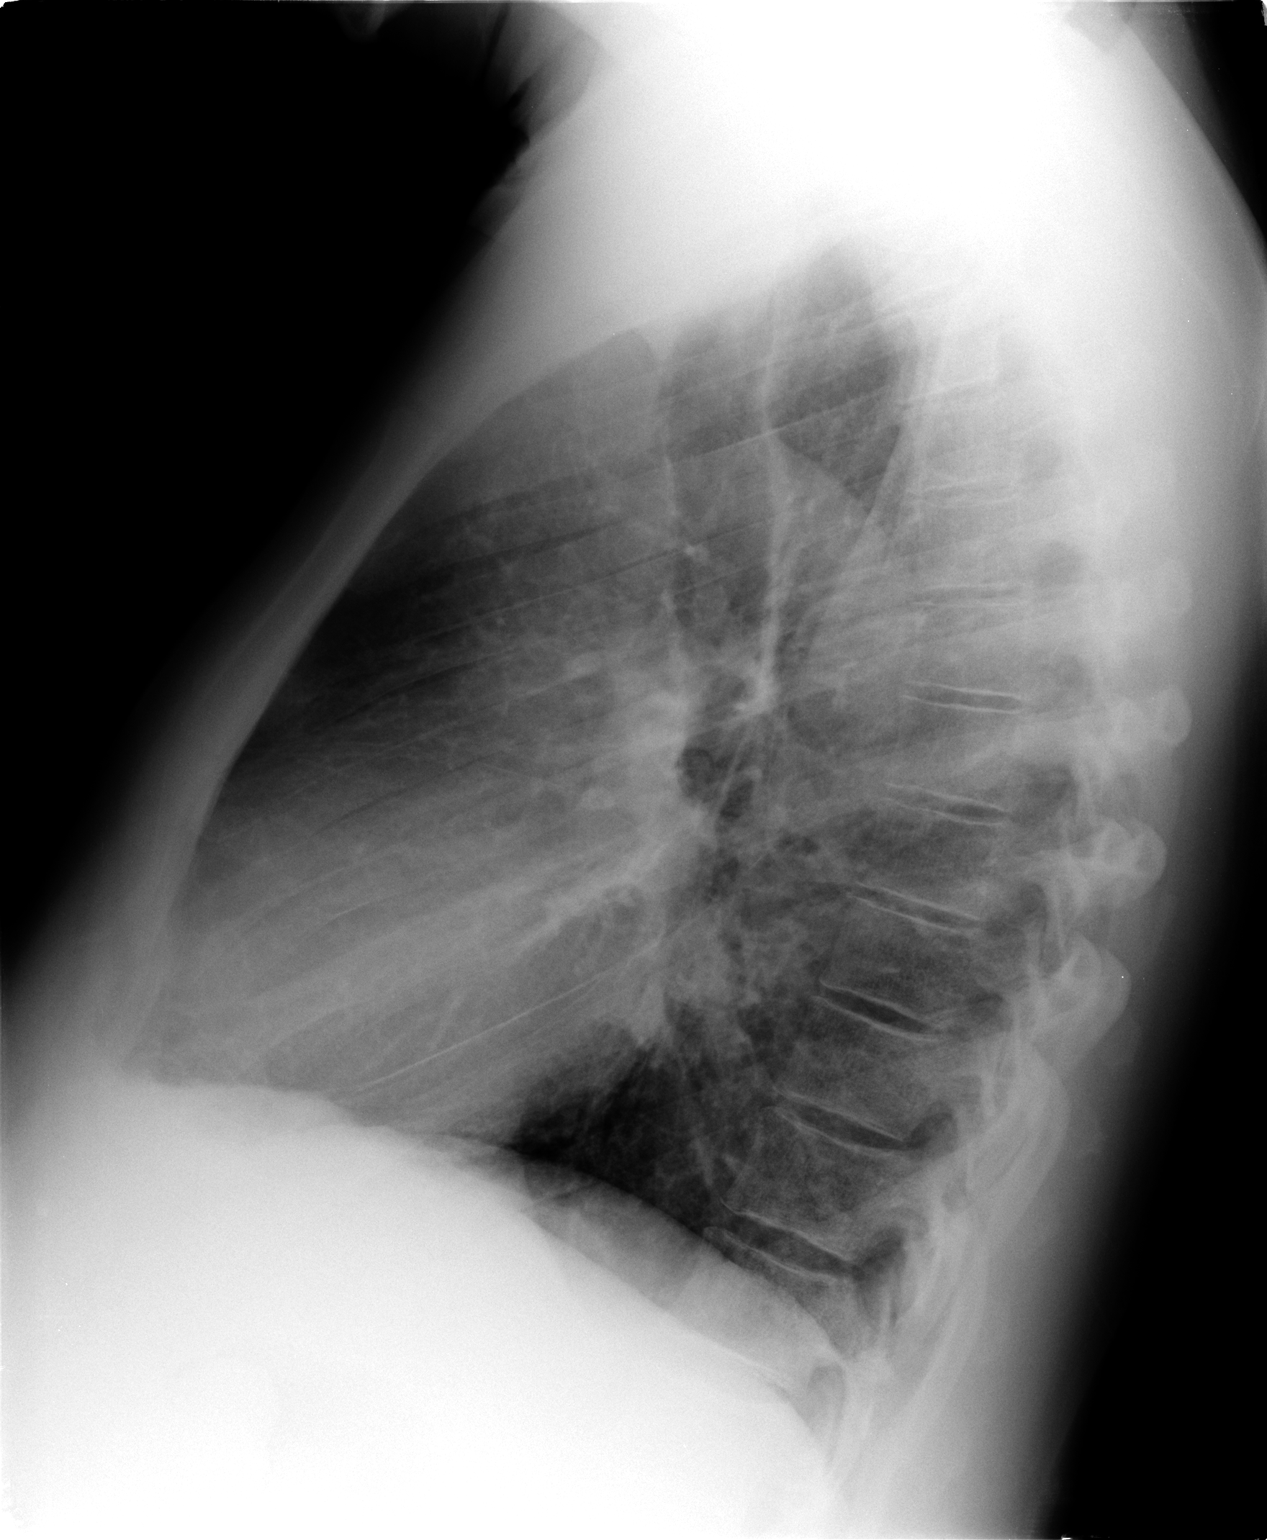

[2 of 2 positions shown; findings below may reference images not displayed]

FINDINGS: Emphysema noted with airway thickening.

Cardiac and mediastinal contours appear unremarkable.

No pleural effusion noted.
IMPRESSION: 1.  Emphysema.
2.  Airway thickening, query bronchitis or reactive airways
disease.

## 2014-01-13 ENCOUNTER — Encounter: Payer: Self-pay | Admitting: Cardiovascular Disease

## 2014-01-13 ENCOUNTER — Encounter: Payer: Self-pay | Admitting: Internal Medicine

## 2014-01-14 ENCOUNTER — Ambulatory Visit: Payer: PRIVATE HEALTH INSURANCE | Admitting: Podiatry

## 2014-01-25 ENCOUNTER — Other Ambulatory Visit: Payer: Self-pay | Admitting: Internal Medicine

## 2014-01-27 ENCOUNTER — Encounter: Payer: Self-pay | Admitting: Internal Medicine

## 2014-01-27 ENCOUNTER — Telehealth: Payer: Self-pay | Admitting: Internal Medicine

## 2014-01-27 NOTE — Telephone Encounter (Signed)
Patient is calling to request a refill on his Xanax rx. Wants to know why his request was denied. Please advise.

## 2014-01-28 NOTE — Telephone Encounter (Signed)
I wrote him a note in [place]

## 2014-01-30 ENCOUNTER — Other Ambulatory Visit: Payer: Self-pay | Admitting: Internal Medicine

## 2014-01-30 ENCOUNTER — Encounter: Payer: Self-pay | Admitting: Internal Medicine

## 2014-02-04 ENCOUNTER — Other Ambulatory Visit: Payer: Self-pay | Admitting: Internal Medicine

## 2014-02-12 ENCOUNTER — Telehealth: Payer: Self-pay | Admitting: *Deleted

## 2014-02-12 NOTE — Telephone Encounter (Signed)
Left msg on triage stating pt is wanting to start getting diabetic supplies with them. Requesting verbal order...[name]

## 2014-02-12 NOTE — Telephone Encounter (Signed)
He does not have diabetes

## 2014-02-13 ENCOUNTER — Ambulatory Visit: Payer: Self-pay | Admitting: Internal Medicine

## 2014-02-13 NOTE — Telephone Encounter (Signed)
Notified Dianne with md response...Johny Chess

## 2014-02-18 ENCOUNTER — Ambulatory Visit: Payer: PRIVATE HEALTH INSURANCE | Admitting: Podiatry

## 2014-03-05 ENCOUNTER — Ambulatory Visit (INDEPENDENT_AMBULATORY_CARE_PROVIDER_SITE_OTHER): Payer: PRIVATE HEALTH INSURANCE | Admitting: Podiatry

## 2014-03-05 ENCOUNTER — Encounter: Payer: Self-pay | Admitting: Podiatry

## 2014-03-05 VITALS — BP 143/87 | HR 95

## 2014-03-05 DIAGNOSIS — M79609 Pain in unspecified limb: Secondary | ICD-10-CM

## 2014-03-05 DIAGNOSIS — B351 Tinea unguium: Secondary | ICD-10-CM

## 2014-03-05 DIAGNOSIS — M79606 Pain in leg, unspecified: Secondary | ICD-10-CM

## 2014-03-05 DIAGNOSIS — Q828 Other specified congenital malformations of skin: Secondary | ICD-10-CM

## 2014-03-05 NOTE — Progress Notes (Signed)
Subjective:  53 year old male presents complaining of pain on left foot callus under ball and right big toe from thick callus.  Stated that his blood sugar runs good with diet and exercise. Stated that his A1c was still good.  Objective: Dermatologic:  Painful porokeratosis under first MPJ left, plantar medial right hallux.  Thick dystrophic nails x 10.  All pedal pulses are palpable.  Epicritic and tactile sensations grossly intact.  Mild digital contracture lesser digits 4-5 bilateral.   Assessment: Onychomycosis x 10.  Painful porokeratosis sub 1 left foot and medial aspect of the right hallux.   Plan: Debrided all nails.

## 2014-03-05 NOTE — Patient Instructions (Signed)
Seen for hypertrophic nails. All nails debrided. Return in 3 months or as needed.  

## 2014-03-07 ENCOUNTER — Ambulatory Visit: Payer: Self-pay | Admitting: Internal Medicine

## 2014-03-13 ENCOUNTER — Ambulatory Visit: Payer: Self-pay | Admitting: Internal Medicine

## 2014-03-13 ENCOUNTER — Telehealth: Payer: Self-pay | Admitting: Internal Medicine

## 2014-03-13 NOTE — Telephone Encounter (Signed)
FYI - Patient did not make appt today.  Looks like they rescheduled to 03/21/2014 at 8:15.

## 2014-03-21 ENCOUNTER — Encounter: Payer: Self-pay | Admitting: Internal Medicine

## 2014-03-21 ENCOUNTER — Ambulatory Visit (INDEPENDENT_AMBULATORY_CARE_PROVIDER_SITE_OTHER): Payer: 59 | Admitting: Internal Medicine

## 2014-03-21 VITALS — BP 140/80 | HR 77 | Temp 98.5°F | Resp 16 | Ht 75.0 in | Wt 196.4 lb

## 2014-03-21 DIAGNOSIS — F341 Dysthymic disorder: Secondary | ICD-10-CM

## 2014-03-21 DIAGNOSIS — G4730 Sleep apnea, unspecified: Secondary | ICD-10-CM

## 2014-03-21 DIAGNOSIS — N529 Male erectile dysfunction, unspecified: Secondary | ICD-10-CM

## 2014-03-21 DIAGNOSIS — F200 Paranoid schizophrenia: Secondary | ICD-10-CM

## 2014-03-21 DIAGNOSIS — F418 Other specified anxiety disorders: Secondary | ICD-10-CM

## 2014-03-21 DIAGNOSIS — M545 Low back pain, unspecified: Secondary | ICD-10-CM

## 2014-03-21 DIAGNOSIS — F17200 Nicotine dependence, unspecified, uncomplicated: Secondary | ICD-10-CM

## 2014-03-21 DIAGNOSIS — M1710 Unilateral primary osteoarthritis, unspecified knee: Secondary | ICD-10-CM

## 2014-03-21 DIAGNOSIS — M179 Osteoarthritis of knee, unspecified: Secondary | ICD-10-CM

## 2014-03-21 MED ORDER — HYDROCODONE-ACETAMINOPHEN 10-325 MG PO TABS: ORAL_TABLET | ORAL | Status: DC

## 2014-03-21 NOTE — Progress Notes (Signed)
Subjective:    Patient ID: [name], male    DOB: 11/05/1960, 53 y.o.   MRN: [id number]  Arthritis Presents for follow-up visit. He complains of pain. He reports no stiffness, joint swelling or joint warmth. Affected locations include the left knee, right knee, left hip and right hip. His pain is at a severity of 4/10. Associated symptoms include pain at night and pain while resting. Pertinent negatives include no diarrhea, dry eyes, dry mouth, dysuria, fatigue, fever, rash, Raynaud's syndrome, uveitis or weight loss. His past medical history is significant for osteoarthritis. His pertinent risk factors include overuse. Past treatments include NSAIDs. The treatment provided mild relief. Factors aggravating his arthritis include activity. Compliance with prior treatments has been variable.      Review of Systems  Constitutional: Negative.  Negative for fever, chills, weight loss, diaphoresis, appetite change and fatigue.  HENT: Negative.   Eyes: Negative.   Respiratory: Negative.  Negative for cough, choking, chest tightness and shortness of breath.   Cardiovascular: Negative.  Negative for chest pain, palpitations and leg swelling.  Gastrointestinal: Negative.  Negative for nausea, vomiting, abdominal pain, diarrhea, constipation and blood in stool.  Endocrine: Negative.   Genitourinary: Negative.  Negative for dysuria.  Musculoskeletal: Positive for arthralgias, arthritis and back pain. Negative for gait problem, joint swelling, myalgias, neck pain, neck stiffness and stiffness.  Skin: Negative.  Negative for rash.  Allergic/Immunologic: Negative.   Neurological: Negative.  Negative for dizziness, tremors, seizures, syncope, weakness, light-headedness, numbness and headaches.  Hematological: Negative.  Negative for adenopathy. Does not bruise/bleed easily.  Psychiatric/Behavioral: Negative for suicidal ideas, hallucinations, behavioral problems, confusion, sleep disturbance,  self-injury, dysphoric mood, decreased concentration and agitation. The patient is nervous/anxious. The patient is not hyperactive.        Objective:   Physical Exam  Vitals reviewed. Constitutional: He is oriented to person, place, and time. He appears well-developed and well-nourished. No distress.  HENT:  Head: Normocephalic and atraumatic.  Mouth/Throat: Oropharynx is clear and moist. No oropharyngeal exudate.  Eyes: Conjunctivae are normal. Right eye exhibits no discharge. Left eye exhibits no discharge. No scleral icterus.  Neck: Normal range of motion. Neck supple. No JVD present. No tracheal deviation present. No thyromegaly present.  Cardiovascular: Normal rate, regular rhythm, normal heart sounds and intact distal pulses.  Exam reveals no gallop and no friction rub.   No murmur heard. Pulmonary/Chest: Effort normal and breath sounds normal. No stridor. No respiratory distress. He has no wheezes. He has no rales. He exhibits no tenderness.  Abdominal: Soft. Bowel sounds are normal. He exhibits no distension and no mass. There is no tenderness. There is no rebound and no guarding.  Musculoskeletal: Normal range of motion. He exhibits no edema and no tenderness.  Lymphadenopathy:    He has no cervical adenopathy.  Neurological: He is oriented to person, place, and time.  Skin: Skin is warm and dry. No rash noted. He is not diaphoretic. No erythema. No pallor.  Psychiatric: He has a normal mood and affect. His behavior is normal. Judgment and thought content normal. His mood appears not anxious. His affect is not angry, not blunt, not labile and not inappropriate. His speech is not rapid and/or pressured. Thought content is not paranoid and not delusional. He does not exhibit a depressed mood. He expresses no homicidal and no suicidal ideation. He expresses no suicidal plans and no homicidal plans.    Lab Results  Component Value Date   WBC 6.0 12/05/2013  HGB 17.7* 12/05/2013   HCT  52.0 12/05/2013   PLT 130.0* 12/05/2013   GLUCOSE 110* 12/05/2013   CHOL 166 12/05/2013   TRIG 82.0 12/05/2013   HDL 52.60 12/05/2013   LDLDIRECT 71.2 09/29/2011   LDLCALC 97 12/05/2013   ALT 51 12/05/2013   AST 48* 12/05/2013   NA 144 12/05/2013   K 4.8 12/05/2013   CL 111 12/05/2013   CREATININE 0.8 12/05/2013   BUN 7 12/05/2013   CO2 27 12/05/2013   TSH 0.94 12/05/2013   PSA 0.69 12/05/2013   INR 1.0 12/19/2008   HGBA1C 6.0 12/05/2013   MICROALBUR 0.2 10/08/2008        Assessment & Plan:

## 2014-03-21 NOTE — Progress Notes (Signed)
Pre visit review using our clinic review tool, if applicable. No additional management support is needed unless otherwise documented below in the visit note. 

## 2014-03-21 NOTE — Assessment & Plan Note (Signed)
Cont norco as needed for pain

## 2014-03-21 NOTE — Assessment & Plan Note (Signed)
Will cont norco as needed for pain 

## 2014-03-21 NOTE — Patient Instructions (Signed)

## 2014-03-21 NOTE — Assessment & Plan Note (Signed)
His last UDS was neg for BZD and was pos for EtOH I have asked him to stay off of the BZD since this combo of meds + EtOH could cause problems with oversedation and accidental OD He agrees with this plan

## 2014-03-31 IMAGING — CR DG HIP COMPLETE 2+V*R*
3 series · 3 of 3 positions shown · non-contrast
Comparison: None.

CLINICAL DATA: Low back pain, injury 15 years ago

RIGHT HIP - COMPLETE 2+ VIEW

[view not recorded (1 of 3)]
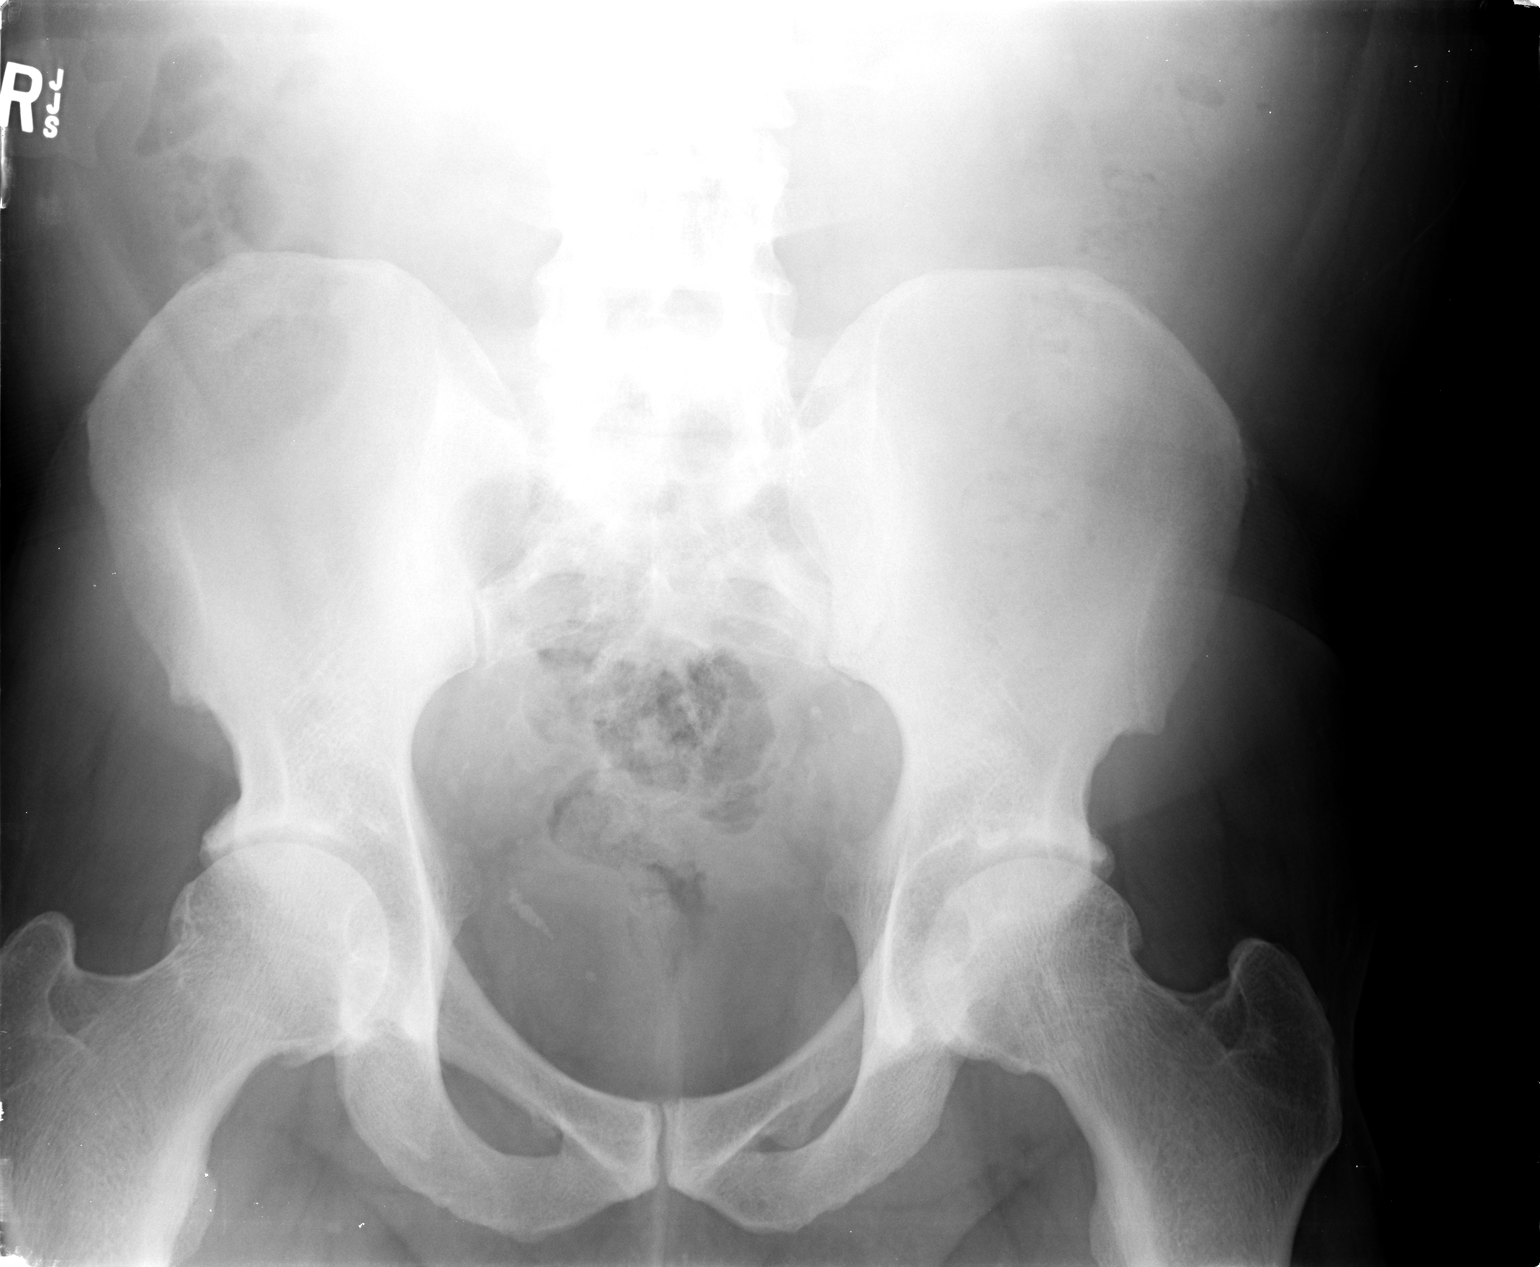

[view not recorded (2 of 3)]
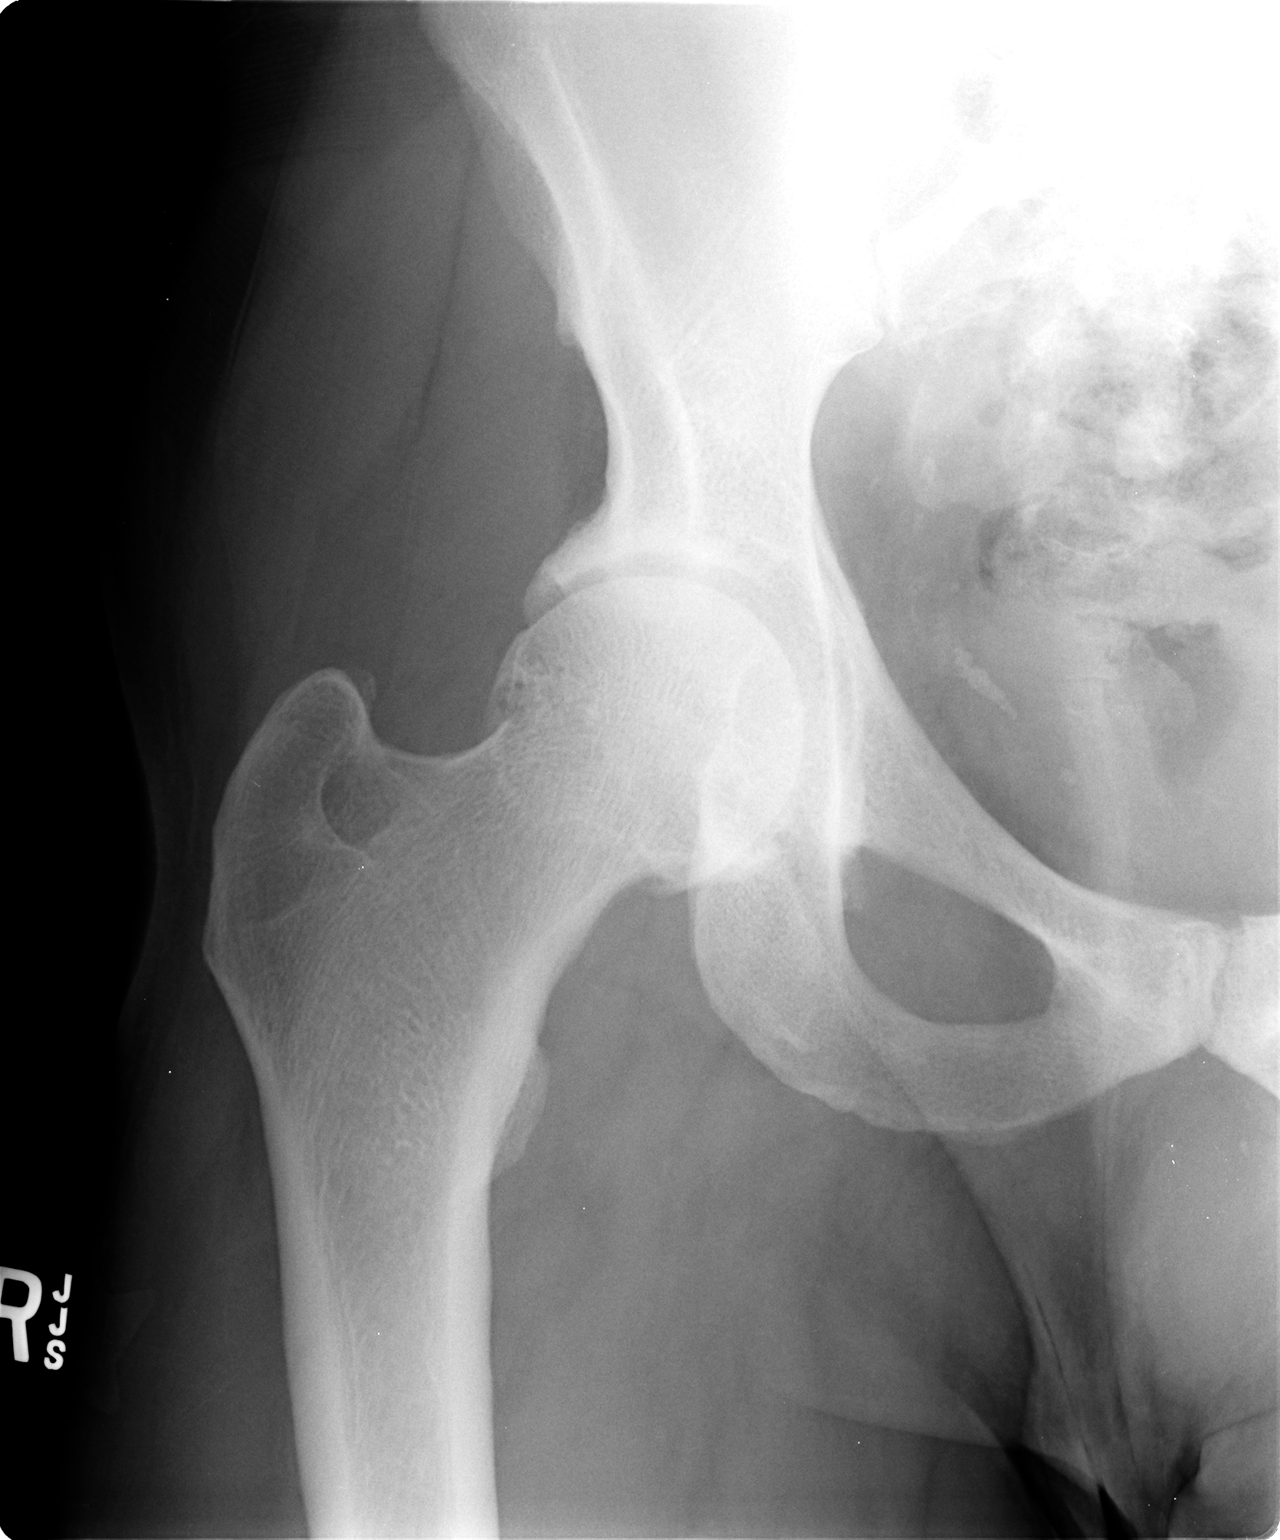

[view not recorded (3 of 3)]
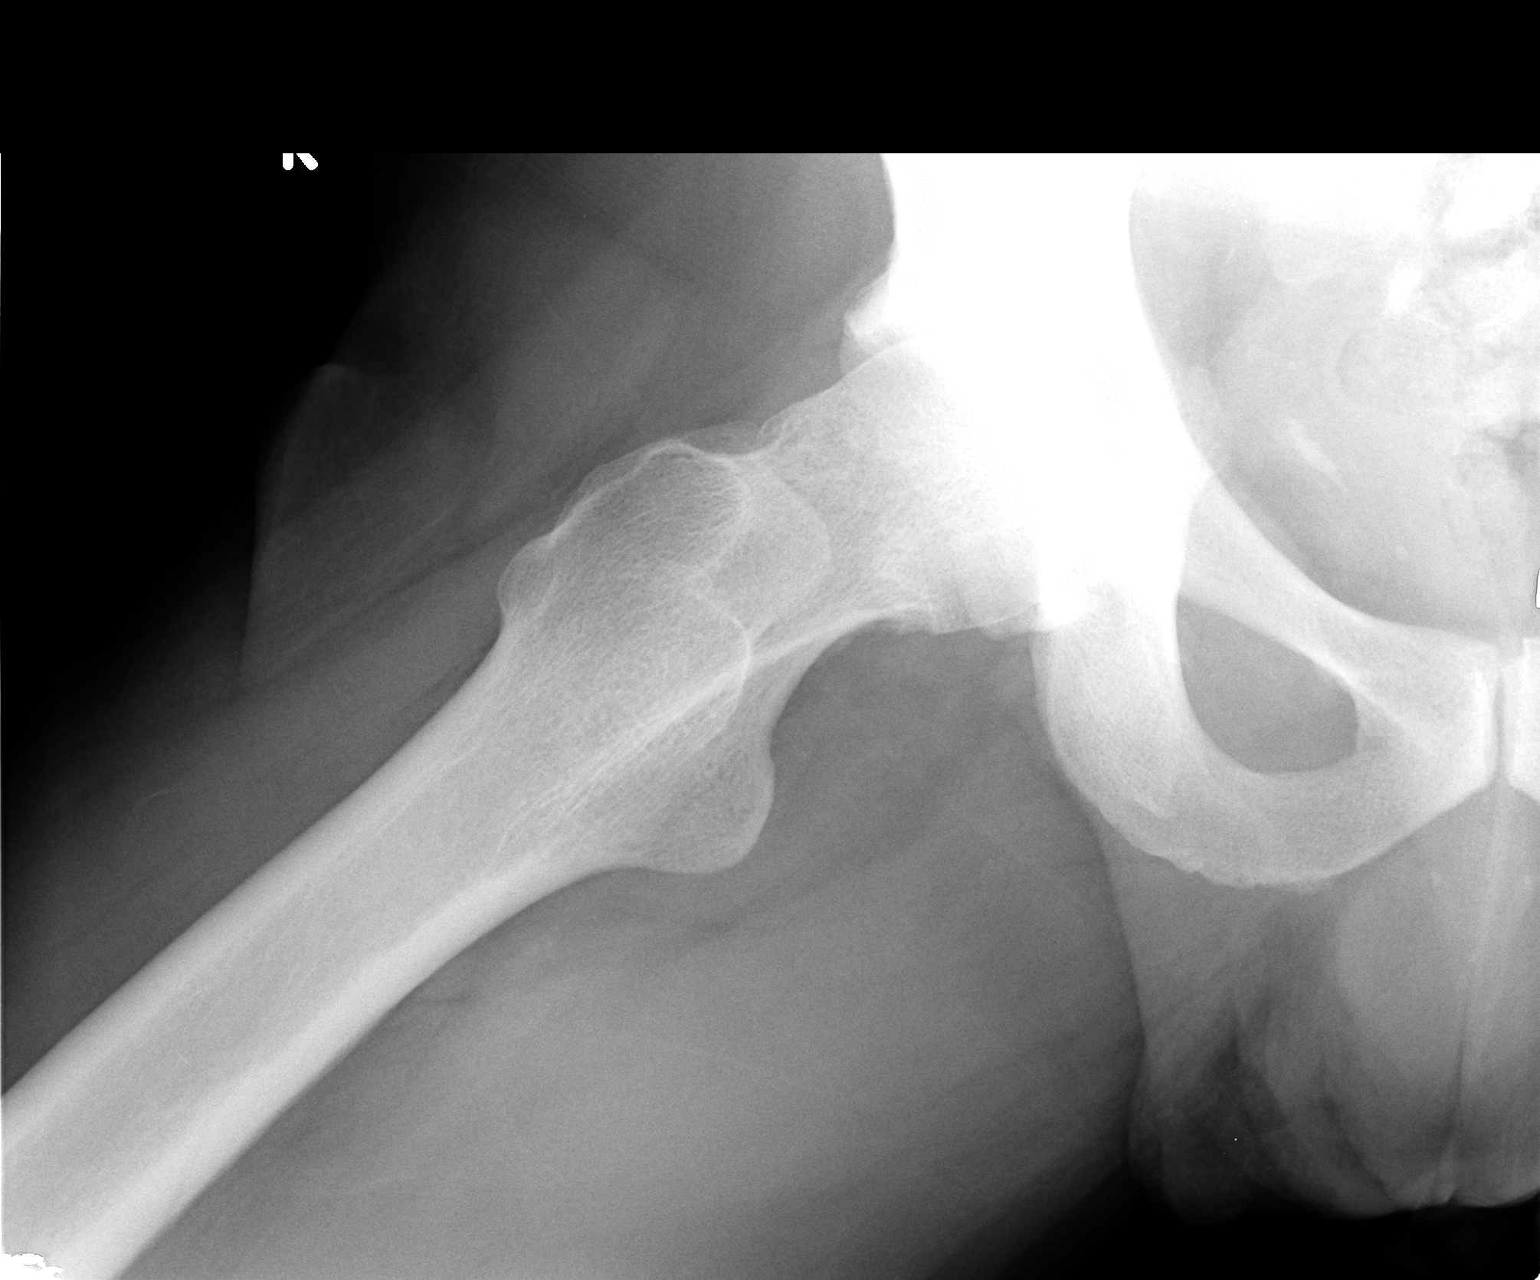

[3 of 3 positions shown; findings below may reference images not displayed]

FINDINGS: Symmetric SI joints.
Mild narrowing of right hip joint space versus left.
Minimal circumferential spur formation at right femoral head.
Osseous mineralization normal.
Scattered pelvic phleboliths.
No acute fracture, dislocation, or bone destruction.
IMPRESSION: Mild osteoarthritic changes right hip joint.
No acute bony abnormalities.

## 2014-03-31 IMAGING — CR DG LUMBAR SPINE COMPLETE 4+V
5 series · 5 of 5 positions shown · non-contrast
Comparison: None.

CLINICAL DATA: Low back pain

LUMBAR SPINE - COMPLETE 4+ VIEW

[view not recorded (1 of 5)]
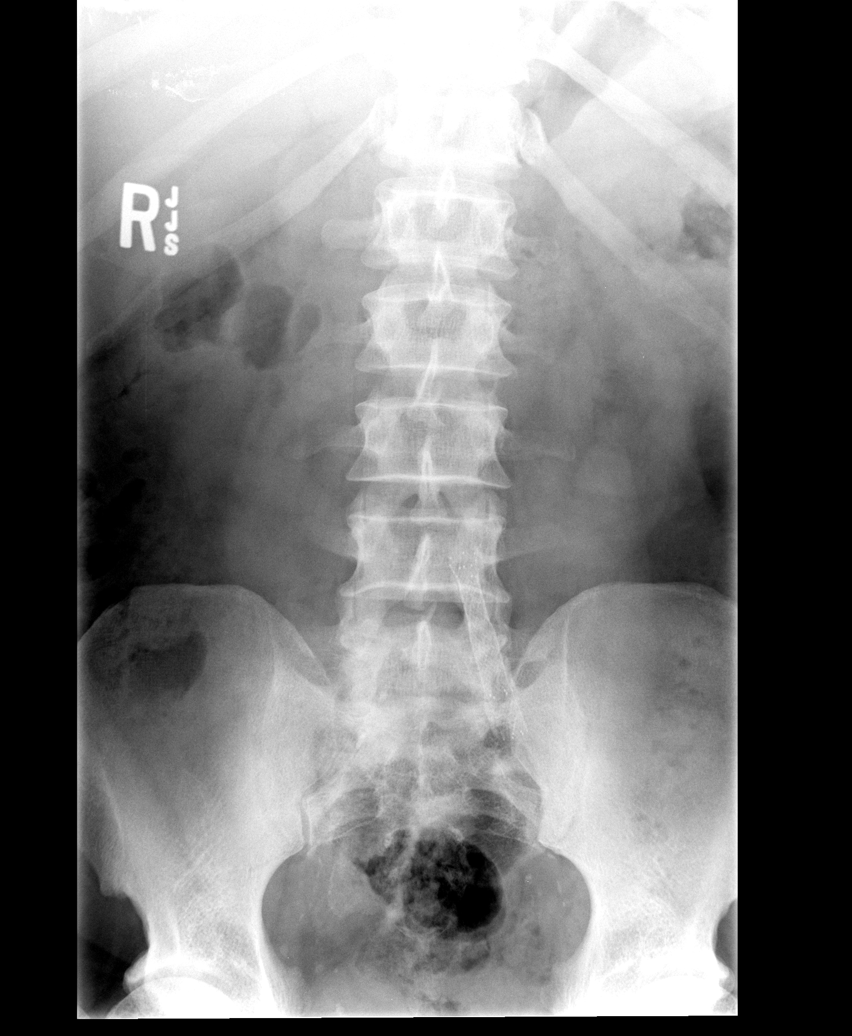

[view not recorded (2 of 5)]
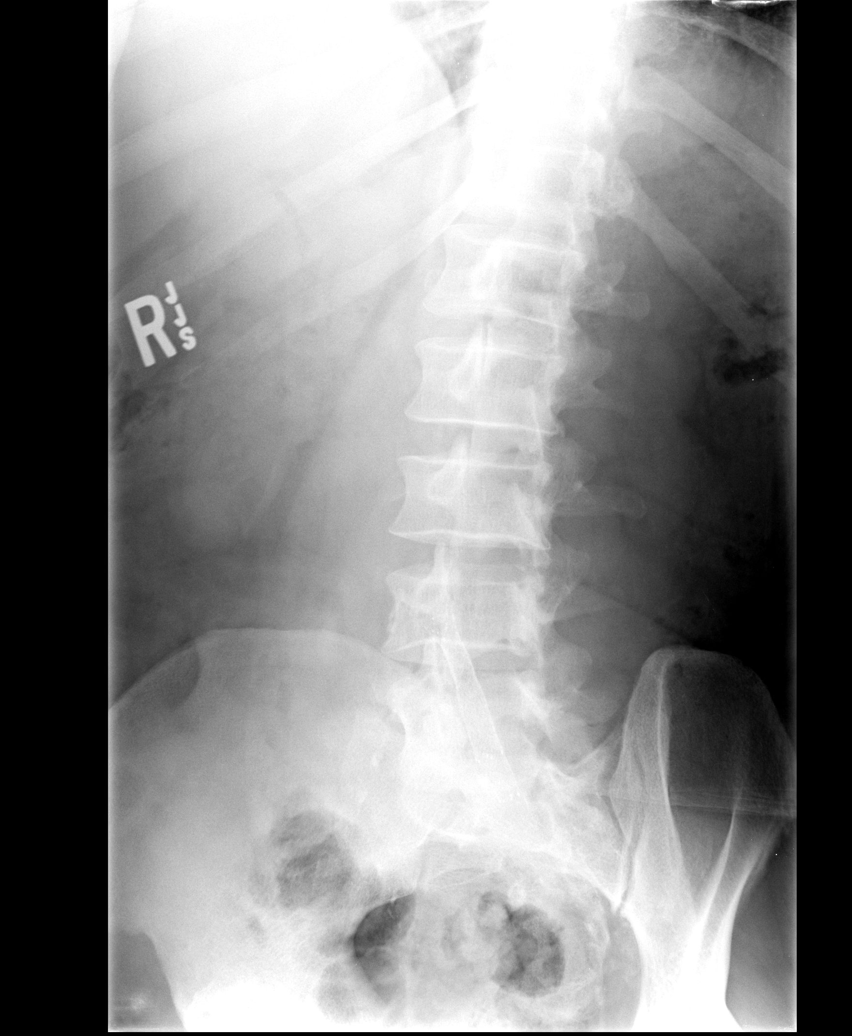

[view not recorded (3 of 5)]
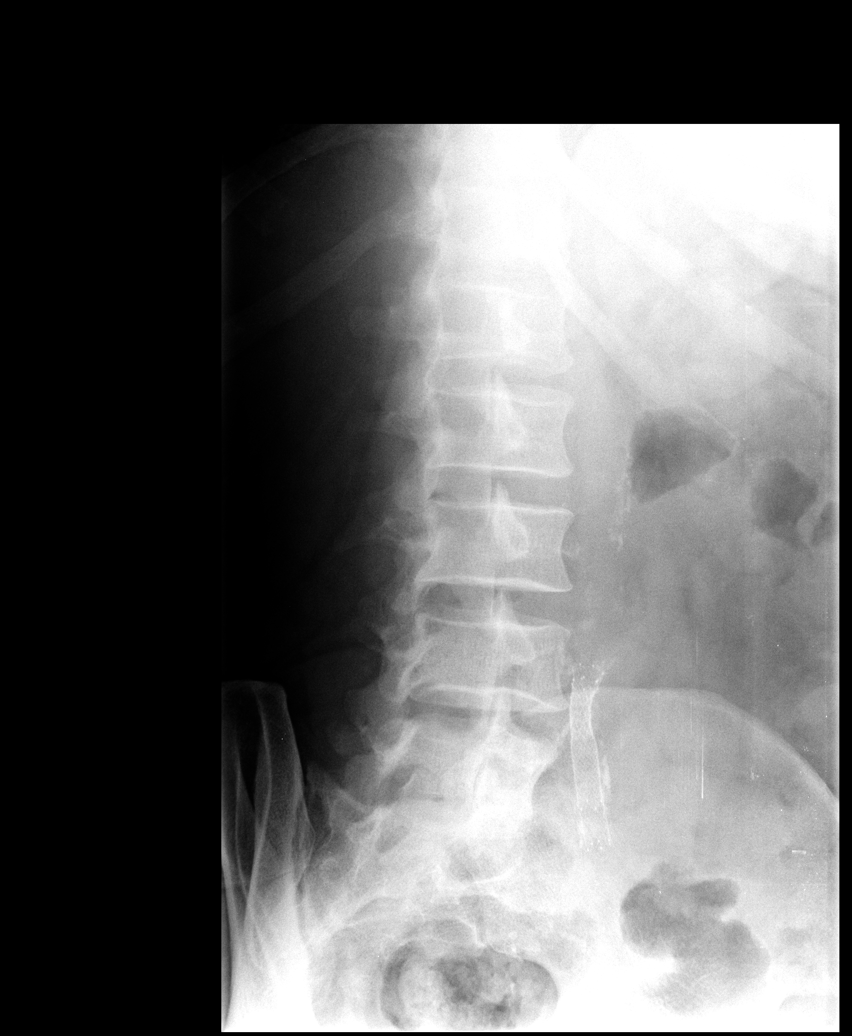

[view not recorded (4 of 5)]
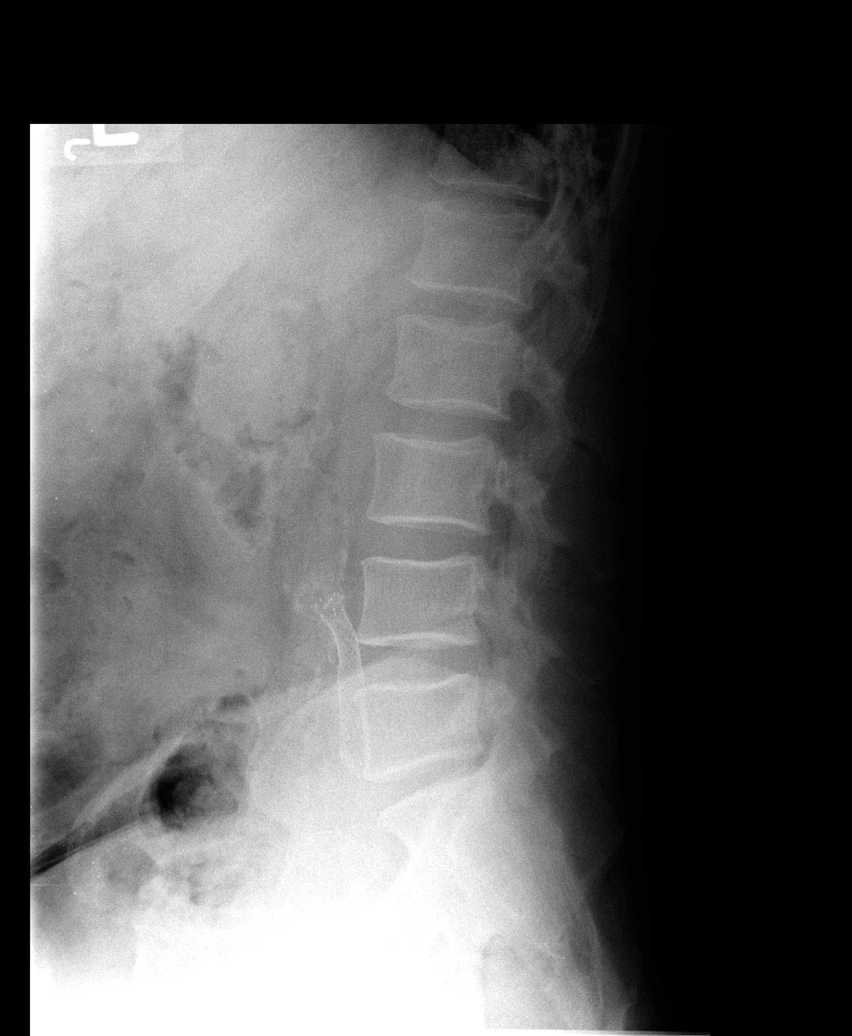

[view not recorded (5 of 5)]
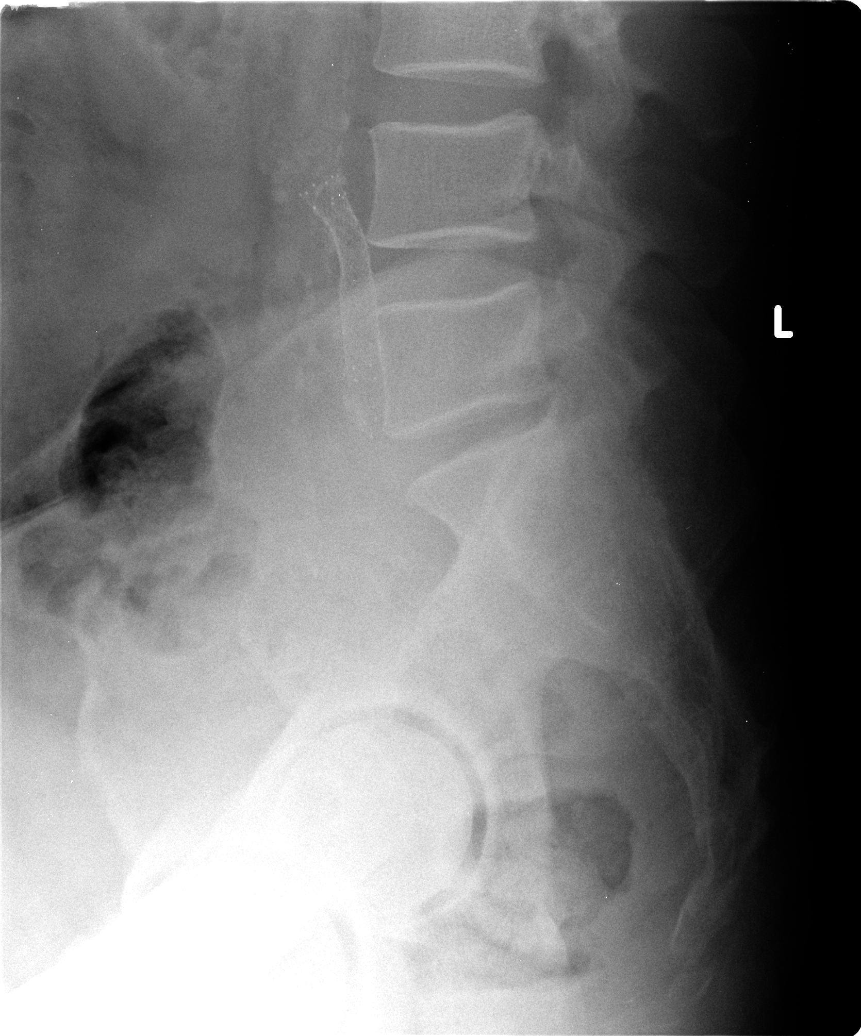

[5 of 5 positions shown; findings below may reference images not displayed]

FINDINGS: Normal lumbar alignment.  Negative for fracture or mass.
Disc spaces are maintained.  Negative for pars defect.

Left iliac arterial stent is present
IMPRESSION: No significant bony abnormality.

## 2014-04-08 ENCOUNTER — Telehealth: Payer: Self-pay

## 2014-04-08 NOTE — Telephone Encounter (Signed)
Patient notified

## 2014-04-08 NOTE — Telephone Encounter (Signed)
I think you mean PREP - pre-exposure prophylaxis with truvada once daily He may be a good candidate for this

## 2014-04-08 NOTE — Telephone Encounter (Signed)
Patient called requeting MD opinion regarding PeEP. He states that this is for high risk patient to help protect from HIV. "It can be a once monthly injection or a tablet taken right before sex". He would like to know MD advisement about this drug and also if MD feels that he would be a good candidate. Thanks

## 2014-04-08 NOTE — Telephone Encounter (Signed)
Patient also would like to know how this drug works. Thanks

## 2014-04-08 NOTE — Telephone Encounter (Signed)
Called pt, no answer and unable to accept VM per recording.

## 2014-04-14 ENCOUNTER — Encounter: Payer: Self-pay | Admitting: Internal Medicine

## 2014-06-06 ENCOUNTER — Ambulatory Visit: Payer: PRIVATE HEALTH INSURANCE | Admitting: Podiatry

## 2014-06-12 ENCOUNTER — Other Ambulatory Visit: Payer: Self-pay | Admitting: Internal Medicine

## 2014-06-17 ENCOUNTER — Other Ambulatory Visit: Payer: Self-pay | Admitting: Internal Medicine

## 2014-06-17 DIAGNOSIS — M179 Osteoarthritis of knee, unspecified: Secondary | ICD-10-CM

## 2014-06-17 DIAGNOSIS — M545 Low back pain, unspecified: Secondary | ICD-10-CM

## 2014-06-17 MED ORDER — HYDROCODONE-ACETAMINOPHEN 10-325 MG PO TABS: ORAL_TABLET | ORAL | Status: DC

## 2014-07-08 ENCOUNTER — Encounter: Payer: Self-pay | Admitting: Internal Medicine

## 2014-07-08 ENCOUNTER — Other Ambulatory Visit (INDEPENDENT_AMBULATORY_CARE_PROVIDER_SITE_OTHER): Payer: 59

## 2014-07-08 ENCOUNTER — Ambulatory Visit (INDEPENDENT_AMBULATORY_CARE_PROVIDER_SITE_OTHER): Payer: 59 | Admitting: Internal Medicine

## 2014-07-08 VITALS — BP 136/88 | HR 86 | Temp 98.0°F | Resp 16 | Ht 75.0 in | Wt 186.0 lb

## 2014-07-08 DIAGNOSIS — R739 Hyperglycemia, unspecified: Secondary | ICD-10-CM

## 2014-07-08 DIAGNOSIS — Z7251 High risk heterosexual behavior: Secondary | ICD-10-CM

## 2014-07-08 DIAGNOSIS — M179 Osteoarthritis of knee, unspecified: Secondary | ICD-10-CM

## 2014-07-08 DIAGNOSIS — E782 Mixed hyperlipidemia: Secondary | ICD-10-CM

## 2014-07-08 DIAGNOSIS — I10 Essential (primary) hypertension: Secondary | ICD-10-CM

## 2014-07-08 DIAGNOSIS — M545 Low back pain, unspecified: Secondary | ICD-10-CM

## 2014-07-08 DIAGNOSIS — F418 Other specified anxiety disorders: Secondary | ICD-10-CM

## 2014-07-08 DIAGNOSIS — E785 Hyperlipidemia, unspecified: Secondary | ICD-10-CM

## 2014-07-08 DIAGNOSIS — F200 Paranoid schizophrenia: Secondary | ICD-10-CM

## 2014-07-08 DIAGNOSIS — Z23 Encounter for immunization: Secondary | ICD-10-CM

## 2014-07-08 DIAGNOSIS — D696 Thrombocytopenia, unspecified: Secondary | ICD-10-CM

## 2014-07-08 DIAGNOSIS — M170 Bilateral primary osteoarthritis of knee: Secondary | ICD-10-CM

## 2014-07-08 LAB — COMPREHENSIVE METABOLIC PANEL
ALK PHOS: 59 U/L (ref 39–117)
ALT: 40 U/L (ref 0–53)
AST: 30 U/L (ref 0–37)
Albumin: 3.8 g/dL (ref 3.5–5.2)
BILIRUBIN TOTAL: 0.7 mg/dL (ref 0.2–1.2)
BUN: 9 mg/dL (ref 6–23)
CO2: 26 meq/L (ref 19–32)
CREATININE: 0.9 mg/dL (ref 0.4–1.5)
Calcium: 9.6 mg/dL (ref 8.4–10.5)
Chloride: 102 mEq/L (ref 96–112)
GFR: 107.38 mL/min (ref >60.00)
GLUCOSE: 132 mg/dL — AB (ref 70–99)
Potassium: 4.2 mEq/L (ref 3.5–5.1)
Sodium: 138 mEq/L (ref 135–145)
Total Protein: 8 g/dL (ref 6.0–8.3)

## 2014-07-08 LAB — HEMOGLOBIN A1C: HEMOGLOBIN A1C: 5.8 % (ref 4.6–6.5)

## 2014-07-08 LAB — LIPID PANEL
CHOLESTEROL: 208 mg/dL — AB (ref 0–200)
HDL: 47.6 mg/dL (ref >39.00)
LDL Cholesterol: 143 mg/dL — ABNORMAL HIGH (ref 0–99)
NonHDL: 160.4
Total CHOL/HDL Ratio: 4
Triglycerides: 88 mg/dL (ref 0.0–149.0)
VLDL: 17.6 mg/dL (ref 0.0–40.0)

## 2014-07-08 LAB — CBC WITH DIFFERENTIAL/PLATELET
BASOS PCT: 0.4 % (ref 0.0–3.0)
Basophils Absolute: 0 10*3/uL (ref 0.0–0.1)
Eosinophils Absolute: 0.1 10*3/uL (ref 0.0–0.7)
Eosinophils Relative: 0.9 % (ref 0.0–5.0)
HEMATOCRIT: 54.8 % — AB (ref 39.0–52.0)
LYMPHS ABS: 2 10*3/uL (ref 0.7–4.0)
Lymphocytes Relative: 27.5 % (ref 12.0–46.0)
MCHC: 33 g/dL (ref 30.0–36.0)
MCV: 97.4 fl (ref 78.0–100.0)
MONO ABS: 0.8 10*3/uL (ref 0.1–1.0)
Monocytes Relative: 11.1 % (ref 3.0–12.0)
Neutro Abs: 4.3 10*3/uL (ref 1.4–7.7)
Neutrophils Relative %: 60.1 % (ref 43.0–77.0)
Platelets: 115 10*3/uL — ABNORMAL LOW (ref 150.0–400.0)
RBC: 5.62 Mil/uL (ref 4.22–5.81)
RDW: 14.5 % (ref 11.5–15.5)
WBC: 7.2 10*3/uL (ref 4.0–10.5)

## 2014-07-08 LAB — TSH: TSH: 1.18 u[IU]/mL (ref 0.35–4.50)

## 2014-07-08 LAB — HIV ANTIBODY (ROUTINE TESTING W REFLEX): HIV 1&2 Ab, 4th Generation: NONREACTIVE

## 2014-07-08 MED ORDER — HYDROCODONE-ACETAMINOPHEN 10-325 MG PO TABS: ORAL_TABLET | ORAL | Status: DC

## 2014-07-08 MED ORDER — QUETIAPINE FUMARATE ER 50 MG PO TB24: 50.0000 mg | ORAL_TABLET | Freq: Every day | ORAL | Status: DC

## 2014-07-08 MED ORDER — PITAVASTATIN CALCIUM 2 MG PO TABS: 1.0000 | ORAL_TABLET | Freq: Every day | ORAL | Status: DC

## 2014-07-08 MED ORDER — ALPRAZOLAM 1 MG PO TABS: 1.0000 mg | ORAL_TABLET | Freq: Two times a day (BID) | ORAL | Status: DC | PRN

## 2014-07-08 NOTE — Progress Notes (Signed)
Subjective:    Patient ID: [name], male    DOB: [birth year], 53 y.o.   MRN: [id number]  Hypertension This is a chronic problem. The current episode started more than 1 year ago. The problem is unchanged. The problem is controlled. Associated symptoms include anxiety. Pertinent negatives include no blurred vision, chest pain, headaches, malaise/fatigue, neck pain, orthopnea, palpitations, peripheral edema, PND, shortness of breath or sweats. Risk factors for coronary artery disease include smoking/tobacco exposure. Past treatments include beta blockers. The current treatment provides moderate improvement. Compliance problems include diet and exercise.       Review of Systems  Constitutional: Negative.  Negative for fever, chills, malaise/fatigue, diaphoresis, appetite change and fatigue.  HENT: Negative.   Eyes: Negative.  Negative for blurred vision.  Respiratory: Negative.  Negative for apnea, cough, choking, chest tightness, shortness of breath, wheezing and stridor.   Cardiovascular: Negative.  Negative for chest pain, palpitations, orthopnea, leg swelling and PND.  Gastrointestinal: Negative.  Negative for nausea, vomiting, abdominal pain, diarrhea, constipation and blood in stool.  Endocrine: Negative.  Negative for polydipsia, polyphagia and polyuria.  Musculoskeletal: Positive for arthralgias (knees, hips) and back pain. Negative for gait problem, joint swelling, neck pain and neck stiffness.  Skin: Negative.  Negative for rash.  Allergic/Immunologic: Negative.   Neurological: Negative.  Negative for headaches.  Hematological: Negative.  Negative for adenopathy. Does not bruise/bleed easily.  Psychiatric/Behavioral: Positive for sleep disturbance and dysphoric mood. Negative for suicidal ideas, hallucinations, behavioral problems, confusion, self-injury, decreased concentration and agitation. The patient is nervous/anxious. The patient is not hyperactive.        Objective:     Physical Exam  Vitals reviewed. Constitutional: He is oriented to person, place, and time. He appears well-developed and well-nourished. No distress.  HENT:  Head: Normocephalic and atraumatic.  Mouth/Throat: Oropharynx is clear and moist. No oropharyngeal exudate.  Eyes: Conjunctivae are normal. Right eye exhibits no discharge. Left eye exhibits no discharge. No scleral icterus.  Neck: Normal range of motion. Neck supple. No JVD present. No tracheal deviation present. No thyromegaly present.  Cardiovascular: Normal rate, normal heart sounds and intact distal pulses.  Exam reveals no gallop and no friction rub.   No murmur heard. Pulmonary/Chest: Effort normal and breath sounds normal. No stridor. No respiratory distress. He has no wheezes. He has no rales. He exhibits no tenderness.  Abdominal: Soft. Bowel sounds are normal. He exhibits no distension and no mass. There is no tenderness. There is no rebound and no guarding.  Musculoskeletal: Normal range of motion. He exhibits no edema and no tenderness.  Lymphadenopathy:    He has no cervical adenopathy.  Neurological: He is oriented to person, place, and time.  Skin: Skin is warm and dry. No rash noted. He is not diaphoretic. No erythema. No pallor.      Lab Results  Component Value Date   WBC 6.0 12/05/2013   HGB 17.7* 12/05/2013   HCT 52.0 12/05/2013   PLT 130.0* 12/05/2013   GLUCOSE 110* 12/05/2013   CHOL 166 12/05/2013   TRIG 82.0 12/05/2013   HDL 52.60 12/05/2013   LDLDIRECT 71.2 09/29/2011   LDLCALC 97 12/05/2013   ALT 51 12/05/2013   AST 48* 12/05/2013   NA 144 12/05/2013   K 4.8 12/05/2013   CL 111 12/05/2013   CREATININE 0.8 12/05/2013   BUN 7 12/05/2013   CO2 27 12/05/2013   TSH 0.94 12/05/2013   PSA 0.69 12/05/2013   INR 1.0 12/19/2008  HGBA1C 6.0 12/05/2013   MICROALBUR 0.2 10/08/2008      Assessment & Plan:

## 2014-07-08 NOTE — Progress Notes (Signed)
Pre visit review using our clinic review tool, if applicable. No additional management support is needed unless otherwise documented below in the visit note. 

## 2014-07-08 NOTE — Patient Instructions (Signed)

## 2014-07-09 ENCOUNTER — Telehealth: Payer: Self-pay | Admitting: Geriatric Medicine

## 2014-07-09 DIAGNOSIS — Z7252 High risk homosexual behavior: Secondary | ICD-10-CM | POA: Insufficient documentation

## 2014-07-09 MED ORDER — EMTRICITABINE-TENOFOVIR DF 200-300 MG PO TABS
1.0000 | ORAL_TABLET | Freq: Every day | ORAL | Status: DC
Start: 2014-07-09 — End: 2015-03-31

## 2014-07-09 NOTE — Assessment & Plan Note (Signed)
He wants to restart xanax, will also restart seroquel

## 2014-07-09 NOTE — Assessment & Plan Note (Signed)
PLT ct is stable HIV is nev This appears to be stable

## 2014-07-09 NOTE — Assessment & Plan Note (Signed)
He will wants to start PREP, will start truvada

## 2014-07-09 NOTE — Assessment & Plan Note (Signed)
Restart seruquel

## 2014-07-09 NOTE — Assessment & Plan Note (Signed)
His BP is well controlled His lytes and renal function are stable 

## 2014-07-09 NOTE — Telephone Encounter (Signed)
Patient called [email or web address] requesting a Rx for the preventative HIV med sent to his local pharmacy, states pt. Assistance program no longer needed. Thanks

## 2014-07-09 NOTE — Addendum Note (Signed)
Addended by: [name] on: 07/09/2014 08:31 AM   Modules accepted: Orders

## 2014-07-09 NOTE — Assessment & Plan Note (Signed)
Cont norco as needed for pain

## 2014-08-11 ENCOUNTER — Other Ambulatory Visit: Payer: Self-pay | Admitting: Internal Medicine

## 2014-08-11 DIAGNOSIS — J441 Chronic obstructive pulmonary disease with (acute) exacerbation: Secondary | ICD-10-CM

## 2014-08-11 DIAGNOSIS — M545 Low back pain, unspecified: Secondary | ICD-10-CM

## 2014-08-11 DIAGNOSIS — M170 Bilateral primary osteoarthritis of knee: Secondary | ICD-10-CM

## 2014-08-11 MED ORDER — ALBUTEROL SULFATE 108 (90 BASE) MCG/ACT IN AEPB: 1.0000 | INHALATION_SPRAY | Freq: Four times a day (QID) | RESPIRATORY_TRACT | Status: DC | PRN

## 2014-08-11 MED ORDER — HYDROCODONE-ACETAMINOPHEN 10-325 MG PO TABS: ORAL_TABLET | ORAL | Status: DC

## 2014-09-02 ENCOUNTER — Ambulatory Visit: Payer: PRIVATE HEALTH INSURANCE | Admitting: Podiatry

## 2014-09-08 ENCOUNTER — Other Ambulatory Visit: Payer: Self-pay | Admitting: Internal Medicine

## 2014-09-09 ENCOUNTER — Ambulatory Visit (INDEPENDENT_AMBULATORY_CARE_PROVIDER_SITE_OTHER): Payer: PRIVATE HEALTH INSURANCE | Admitting: Podiatry

## 2014-09-09 ENCOUNTER — Other Ambulatory Visit: Payer: Self-pay | Admitting: Internal Medicine

## 2014-09-09 ENCOUNTER — Encounter: Payer: Self-pay | Admitting: Podiatry

## 2014-09-09 VITALS — BP 144/84 | HR 86 | Ht 75.0 in | Wt 189.0 lb

## 2014-09-09 DIAGNOSIS — M170 Bilateral primary osteoarthritis of knee: Secondary | ICD-10-CM

## 2014-09-09 DIAGNOSIS — M545 Low back pain, unspecified: Secondary | ICD-10-CM

## 2014-09-09 DIAGNOSIS — M79606 Pain in leg, unspecified: Secondary | ICD-10-CM

## 2014-09-09 DIAGNOSIS — B351 Tinea unguium: Secondary | ICD-10-CM

## 2014-09-09 DIAGNOSIS — Q828 Other specified congenital malformations of skin: Secondary | ICD-10-CM

## 2014-09-09 NOTE — Telephone Encounter (Signed)
yes

## 2014-09-09 NOTE — Patient Instructions (Signed)
Seen for hypertrophic nails and calluses. All nails and calluses debrided. Return in 3 months or as needed. May benefit from excision callused lesion after acid treatment under left foot.

## 2014-09-09 NOTE — Progress Notes (Signed)
Subjective: 53 year old male presents complaining of painful feet. Left foot callus under ball and right big toe from thick callus are painful.   Objective: Dermatologic: Painful porokeratosis under first MPJ left, plantar medial right hallux. Thick dystrophic nails x 10. Epicritic and tactile sensations grossly intact. Mild digital contracture lesser digits 4-5 bilateral.  Assessment: Onychomycosis x 10. Painful porokeratosis sub 1 left, right hallux.  Plan: Reviewed findings and available options. Debrided all nails and calluses.  May benefit from excision of porokeratotic lesion after acid treatment. Patient will return after he use acid patch for at least a week.  Penlac prescribed as per patient request

## 2014-09-09 NOTE — Telephone Encounter (Signed)
HYDROcodone-acetaminophen (NORCO) 10-325 MG, please advise if ok to refill?

## 2014-09-10 ENCOUNTER — Telehealth: Payer: Self-pay | Admitting: Internal Medicine

## 2014-09-10 ENCOUNTER — Encounter: Payer: Self-pay | Admitting: Internal Medicine

## 2014-09-10 MED ORDER — HYDROCODONE-ACETAMINOPHEN 10-325 MG PO TABS: ORAL_TABLET | ORAL | Status: DC

## 2014-09-10 NOTE — Telephone Encounter (Signed)
emmi emailed °

## 2014-09-11 ENCOUNTER — Encounter (HOSPITAL_COMMUNITY): Payer: Self-pay | Admitting: *Deleted

## 2014-09-11 ENCOUNTER — Other Ambulatory Visit: Payer: Self-pay

## 2014-09-11 ENCOUNTER — Emergency Department (INDEPENDENT_AMBULATORY_CARE_PROVIDER_SITE_OTHER)
Admission: EM | Admit: 2014-09-11 | Discharge: 2014-09-11 | Disposition: A | Discharge status: Home / Self Care | Payer: 59 | Source: Home / Self Care | Attending: Emergency Medicine | Admitting: Emergency Medicine

## 2014-09-11 DIAGNOSIS — S29011A Strain of muscle and tendon of front wall of thorax, initial encounter: Secondary | ICD-10-CM

## 2014-09-11 MED ORDER — CYCLOBENZAPRINE HCL 10 MG PO TABS: 10.0000 mg | ORAL_TABLET | Freq: Every evening | ORAL | Status: DC | PRN

## 2014-09-11 MED ORDER — MELOXICAM 15 MG PO TABS: 15.0000 mg | ORAL_TABLET | Freq: Every day | ORAL | Status: DC | PRN

## 2014-09-11 NOTE — ED Provider Notes (Signed)
CSN: [id number]     Arrival date & time 09/11/14  1137 History   First MD Initiated Contact with Patient 09/11/14 1150     Chief Complaint  Patient presents with  . Chest Pain   (Consider location/radiation/quality/duration/timing/severity/associated sxs/prior Treatment) HPI He is a 53 year old man with multiple medical problems presenting for left shoulder pain. He reports pain in the anterior left shoulder and left upper back for several days. He states he helped a friend move over the weekend. The pain is described as an ache. It is better when he lays down. It is also better when he presses on the chest and upper back. He denies any radiating pain. No problems with shoulder movement. No numbness, tingling, weakness in the upper extremity. He denies any associated nausea, shortness of breath, dizziness, diaphoresis.  Past Medical History  Diagnosis Date  . PVD (peripheral vascular disease)   . Cough   . Rhinitis   . Chronic airway obstruction, not elsewhere classified   . Family history of colonic polyps   . Family history of malignant neoplasm of gastrointestinal tract   . Personal history of colonic polyps   . Dysphagia, unspecified(787.20)   . Thrombocytopenia, unspecified   . 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 Tobacco use disorder   . Lumbago   . Type II or unspecified type diabetes mellitus with unspecified complication, not stated as uncontrolled   . Hypertension     MIXED  . Hypertrophy of prostate with urinary obstruction and other lower urinary tract symptoms (LUTS)   . Asthma   . GERD (gastroesophageal reflux disease)   . Schizophrenia   . PUD (peptic ulcer disease)   . Colon polyp    Past Surgical History  Procedure Laterality Date  . Tracheostomy    . Carpal tunnel release Right     WRIST  . Iliac artery stent Left 07/2009    STENT COMMON ILIAC ARTERY. (DR. Gwenlyn Found)  . Podiatric Left 2011    FOOT  SURGERY  . Coronary stent placement    . Colonoscopy  approx 2-3 years ago   Family History  Problem Relation Age of Onset  . Colon cancer Mother   . Stroke Mother   . Colon cancer Mother   . Dementia Mother   . Diabetes      3/6 siblings  . Alcohol abuse    . Arthritis    . Hypertension    . Hyperlipidemia    . Heart disease Father   . Heart attack Father   . Alcohol abuse Father   . Colon polyps Sister   . Alcohol abuse Sister   . Anxiety disorder Sister   . Depression Sister   . Drug abuse Brother   . Alcohol abuse Brother   . Alcohol abuse Sister   . Alcohol abuse Sister   . Depression Sister   . Anxiety disorder Sister   . Drug abuse Brother   . Alcohol abuse Brother    History  Substance Use Topics  . Smoking status: Current Every Day Smoker -- 1.00 packs/day for 38 years    Types: Cigarettes  . Smokeless tobacco: Never Used  . Alcohol Use: No    Review of Systems  Constitutional: Negative.   Respiratory: Negative.   Cardiovascular: Negative.   Gastrointestinal: Negative.   Musculoskeletal: Positive for back pain.       Shoulder/anterior chest pain  Neurological: Negative.  Allergies  Crestor; Ace inhibitors; and Clopidogrel bisulfate  Home Medications   Prior to Admission medications   Medication Sig Start Date End Date Taking? Authorizing Provider  Albuterol Sulfate (PROAIR RESPICLICK) 622 (90 BASE) MCG/ACT AEPB Inhale 1 puff into the lungs 4 (four) times daily as needed. 08/11/14   [name], MD  ALPRAZolam Duanne Moron) 1 MG tablet Take 1 tablet (1 mg total) by mouth 2 (two) times daily as needed for anxiety. 07/08/14   [name], MD  Blood Glucose Calibration (ELEMENT CONTROL) NORMAL LIQD as directed.      Historical Provider, MD  Blood Glucose Monitoring Suppl ([place]) W/DEVICE KIT      Historical Provider, MD  carvedilol (COREG) 12.5 MG tablet take 1 tablet by mouth twice a day with meals 12/14/13   [name], MD   cyclobenzaprine (FLEXERIL) 10 MG tablet Take 1 tablet (10 mg total) by mouth at bedtime as needed for muscle spasms. 09/11/14   Melony Overly, MD  emtricitabine-tenofovir (TRUVADA) 200-300 MG per tablet Take 1 tablet by mouth daily. 07/09/14   [name], MD  esomeprazole (NEXIUM) 40 MG capsule Take 1 capsule (40 mg total) by mouth daily before breakfast. 10/05/12   [name], MD  glucose blood (ELEMENT TEST) test strip 1 each by Other route as needed. Use as instructed     Historical Provider, MD  HYDROcodone-acetaminophen (NORCO) 10-325 MG per tablet TAKE 1  TABLET BY MOUTH EVERY 4 HOURS AS NEEDED FOR PAIN with limit 4 per day 09/10/14   [name], MD  LOVAZA 1 G capsule take 2 capsules by mouth twice a day 02/04/14   [name], MD  meloxicam (MOBIC) 15 MG tablet Take 1 tablet (15 mg total) by mouth daily as needed for pain. 09/11/14   Melony Overly, MD  PARoxetine (PAXIL) 30 MG tablet TAKE 1 TABLET BY MOUTH EVERY MORNING 12/14/13   [name], MD  Pitavastatin Calcium (LIVALO) 2 MG TABS Take 1 tablet (2 mg total) by mouth daily. 07/08/14   [name], MD  QUEtiapine (SEROQUEL XR) 50 MG TB24 24 hr tablet Take 1 tablet (50 mg total) by mouth at bedtime. 07/08/14   [name], MD  SPIRIVA HANDIHALER 18 MCG inhalation capsule PLACE 1 CAPSULE INTO THE INHALER AND INHALE DAILY 09/12/13   [name], MD  traZODone (DESYREL) 100 MG tablet take 3 tablets by mouth at bedtime 08/11/14   [name], MD   BP 137/93 mmHg  Pulse 88  Temp(Src) 98.4 F (36.9 C) (Oral)  Resp 16  SpO2 96% Physical Exam  Constitutional: He is oriented to person, place, and time. He appears well-developed and well-nourished. No distress.  HENT:  Head: Normocephalic and atraumatic.  Neck: Neck supple.  Cardiovascular: Normal rate, regular rhythm and normal heart sounds.   No murmur heard. Pulmonary/Chest: Effort normal and breath sounds normal. No respiratory distress. He has no wheezes. He  has no rales.  Musculoskeletal:  Left shoulder: full active ROM without pain.  No point tenderness. Subjective improvement in pain with palpation of left superior chest wall and left upper back.  Neurological: He is alert and oriented to person, place, and time.    ED Course  ED EKG  Date/Time: 09/11/2014 12:25 PM Performed by: Melony Overly Authorized by: Melony Overly Interpreted by ED physician Comparison: compared with previous ECG  Similar to previous ECG Rhythm: sinus rhythm Rate:  normal QRS axis: left Conduction: conduction normal ST Segments: ST segments normal T Waves: T waves normal Clinical impression: abnormal ECG Comments: NSR with left axis deviation   (including critical care time) Labs Review Labs Reviewed - No data to display  Imaging Review No results found.   MDM   1. Muscle strain of chest wall, initial encounter    EKG negative for ischemia. We'll treat for muscular strain with meloxicam and Flexeril. Recommended alternating ice and heat to the area. Follow-up as needed.    Melony Overly, MD 09/11/14 1226

## 2014-09-11 NOTE — ED Notes (Signed)
Pt        Reports  l sided      Chest  Pain       With       Pain  l  Upper  Back     X  5  Days   Pt  Reports he  Did  Some  Lifting   5  Days  Ago        His skin is warm  Dry   He   Has  A history of  Copd/chf  In past  Yet   Is still smoking    - he  Is sitting upright on the  Exam table in complete  sentances

## 2014-09-11 NOTE — Discharge Instructions (Signed)
You have strained the muscles in your chest and back. Your EKG is normal. Take meloxicam once a day as needed.  Do not take with aleve, ibuprofen or motrin. Use flexeril at bedtime as needed. It should gradually improve over the next 1-2 weeks. Followup as needed.

## 2014-09-15 ENCOUNTER — Emergency Department (HOSPITAL_COMMUNITY): Payer: Medicare Other

## 2014-09-15 ENCOUNTER — Emergency Department (HOSPITAL_COMMUNITY)
Admission: EM | Admit: 2014-09-15 | Discharge: 2014-09-15 | Disposition: A | Discharge status: Home / Self Care | Payer: Medicare Other | Attending: Emergency Medicine | Admitting: Emergency Medicine

## 2014-09-15 ENCOUNTER — Encounter (HOSPITAL_COMMUNITY): Payer: Self-pay | Admitting: Emergency Medicine

## 2014-09-15 ENCOUNTER — Other Ambulatory Visit: Payer: Self-pay | Admitting: Internal Medicine

## 2014-09-15 DIAGNOSIS — Z86010 Personal history of colonic polyps: Secondary | ICD-10-CM | POA: Insufficient documentation

## 2014-09-15 DIAGNOSIS — E119 Type 2 diabetes mellitus without complications: Secondary | ICD-10-CM | POA: Insufficient documentation

## 2014-09-15 DIAGNOSIS — R000 Tachycardia, unspecified: Secondary | ICD-10-CM | POA: Diagnosis not present

## 2014-09-15 DIAGNOSIS — J449 Chronic obstructive pulmonary disease, unspecified: Secondary | ICD-10-CM | POA: Insufficient documentation

## 2014-09-15 DIAGNOSIS — K219 Gastro-esophageal reflux disease without esophagitis: Secondary | ICD-10-CM | POA: Insufficient documentation

## 2014-09-15 DIAGNOSIS — Z8659 Personal history of other mental and behavioral disorders: Secondary | ICD-10-CM | POA: Diagnosis not present

## 2014-09-15 DIAGNOSIS — I10 Essential (primary) hypertension: Secondary | ICD-10-CM | POA: Insufficient documentation

## 2014-09-15 DIAGNOSIS — Z862 Personal history of diseases of the blood and blood-forming organs and certain disorders involving the immune mechanism: Secondary | ICD-10-CM | POA: Diagnosis not present

## 2014-09-15 DIAGNOSIS — F1010 Alcohol abuse, uncomplicated: Secondary | ICD-10-CM

## 2014-09-15 DIAGNOSIS — Z8619 Personal history of other infectious and parasitic diseases: Secondary | ICD-10-CM | POA: Diagnosis not present

## 2014-09-15 DIAGNOSIS — R079 Chest pain, unspecified: Secondary | ICD-10-CM

## 2014-09-15 DIAGNOSIS — Z720 Tobacco use: Secondary | ICD-10-CM | POA: Insufficient documentation

## 2014-09-15 DIAGNOSIS — Z8711 Personal history of peptic ulcer disease: Secondary | ICD-10-CM | POA: Insufficient documentation

## 2014-09-15 DIAGNOSIS — Z79899 Other long term (current) drug therapy: Secondary | ICD-10-CM | POA: Insufficient documentation

## 2014-09-15 LAB — I-STAT TROPONIN, ED
TROPONIN I, POC: 0.01 ng/mL (ref 0.00–0.08)
Troponin i, poc: 0 ng/mL (ref 0.00–0.08)

## 2014-09-15 LAB — CBC
HEMATOCRIT: 54.1 % — AB (ref 39.0–52.0)
Hemoglobin: 19.3 g/dL — ABNORMAL HIGH (ref 13.0–17.0)
MCH: 33.8 pg (ref 26.0–34.0)
MCHC: 35.7 g/dL (ref 30.0–36.0)
MCV: 94.7 fL (ref 78.0–100.0)
Platelets: 110 10*3/uL — ABNORMAL LOW (ref 150–400)
RBC: 5.71 MIL/uL (ref 4.22–5.81)
RDW: 14.7 % (ref 11.5–15.5)
WBC: 6.3 10*3/uL (ref 4.0–10.5)

## 2014-09-15 LAB — BASIC METABOLIC PANEL
Anion gap: 14 (ref 5–15)
BUN: 8 mg/dL (ref 6–23)
CALCIUM: 9.5 mg/dL (ref 8.4–10.5)
CHLORIDE: 103 meq/L (ref 96–112)
CO2: 23 meq/L (ref 19–32)
CREATININE: 0.73 mg/dL (ref 0.50–1.35)
GFR calc Af Amer: >90 mL/min (ref >90)
GFR calc non Af Amer: >90 mL/min (ref >90)
Glucose, Bld: 108 mg/dL — ABNORMAL HIGH (ref 70–99)
Potassium: 4.4 mEq/L (ref 3.7–5.3)
Sodium: 140 mEq/L (ref 137–147)

## 2014-09-15 MED ORDER — KETOROLAC TROMETHAMINE 30 MG/ML IJ SOLN
30.0000 mg | Freq: Once | INTRAMUSCULAR | Status: AC
  Administered 2014-09-15: 30 mg via INTRAVENOUS

## 2014-09-15 MED ORDER — OMEPRAZOLE 20 MG PO CPDR: 20.0000 mg | DELAYED_RELEASE_CAPSULE | Freq: Every day | ORAL | Status: DC

## 2014-09-15 MED ORDER — KETOROLAC TROMETHAMINE 30 MG/ML IJ SOLN: 30.0000 mg | Freq: Once | INTRAMUSCULAR | Status: DC

## 2014-09-15 MED ORDER — PANTOPRAZOLE SODIUM 40 MG PO TBEC
40.0000 mg | DELAYED_RELEASE_TABLET | Freq: Once | ORAL | Status: AC
  Administered 2014-09-15: 40 mg via ORAL
  Filled 2014-09-15: qty 1

## 2014-09-15 MED ORDER — SODIUM CHLORIDE 0.9 % IV BOLUS (SEPSIS)
1000.0000 mL | Freq: Once | INTRAVENOUS | Status: AC
  Administered 2014-09-15: 1000 mL via INTRAVENOUS

## 2014-09-15 MED ORDER — KETOROLAC TROMETHAMINE 30 MG/ML IJ SOLN
30.0000 mg | Freq: Once | INTRAMUSCULAR | Status: DC
  Filled 2014-09-15: qty 1

## 2014-09-15 MED ORDER — GI COCKTAIL ~~LOC~~
30.0000 mL | Freq: Once | ORAL | Status: AC
  Administered 2014-09-15: 30 mL via ORAL
  Filled 2014-09-15: qty 30

## 2014-09-15 NOTE — ED Notes (Signed)
NAD at this time. Pt is stable and leaving on the bus.

## 2014-09-15 NOTE — ED Provider Notes (Signed)
CSN: [id number]     Arrival date & time 09/15/14  0550 History   First MD Initiated Contact with Patient 09/15/14 0601     Chief Complaint  Patient presents with  . Chest Pain     (Consider location/radiation/quality/duration/timing/severity/associated sxs/prior Treatment) HPI Comments: Patient presents today with a chief complaint of left anterior chest pain. He reports that the pain radiates to the left shoulder blade.  Pain has been present for the past 8 days.  He reports that the pain is there almost constantly, but will temporarily resolve when applying pressure or moving positions.  Pain is not pleuritic.  He was seen at [facility] for the same 4 days ago.  He was diagnosed with a muscle strain at that time.  He had been helping a friend move prior to onset of pain.  He has been taking Aleve and Flexeril, but does not feel that it is helping.  He denies fever, chills, SOB, nausea, vomiting, abdominal pain, numbness, or tingling.  He does have a history of HTN, Hyperlipidemia, and does smoke 1.5-2 ppd.  He also reports that his father had a MI at the age of 50.  He denies history of PE.  No prolonged travel or surgeries in the past 4 weeks.  No LE edema.   He also reports that he drinks a 12 pack of beer daily and that he drank two beers prior to coming in today.  Patient is a 53 y.o. male presenting with chest pain. The history is provided by the patient.  Chest Pain   Past Medical History  Diagnosis Date  . PVD (peripheral vascular disease)   . Cough   . Rhinitis   . Chronic airway obstruction, not elsewhere classified   . Family history of colonic polyps   . Family history of malignant neoplasm of gastrointestinal tract   . Personal history of colonic polyps   . Dysphagia, unspecified(787.20)   . Thrombocytopenia, unspecified   . 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 Tobacco use disorder   . Lumbago   . Type II or  unspecified type diabetes mellitus with unspecified complication, not stated as uncontrolled   . Hypertension     MIXED  . Hypertrophy of prostate with urinary obstruction and other lower urinary tract symptoms (LUTS)   . Asthma   . GERD (gastroesophageal reflux disease)   . Schizophrenia   . PUD (peptic ulcer disease)   . Colon polyp    Past Surgical History  Procedure Laterality Date  . Tracheostomy    . Carpal tunnel release Right     WRIST  . Iliac artery stent Left 07/2009    STENT COMMON ILIAC ARTERY. (DR. Gwenlyn Found)  . Podiatric Left 2011    FOOT SURGERY  . Coronary stent placement    . Colonoscopy  approx 2-3 years ago   Family History  Problem Relation Age of Onset  . Colon cancer Mother   . Stroke Mother   . Colon cancer Mother   . Dementia Mother   . Diabetes      3/6 siblings  . Alcohol abuse    . Arthritis    . Hypertension    . Hyperlipidemia    . Heart disease Father   . Heart attack Father   . Alcohol abuse Father   . Colon polyps Sister   . Alcohol abuse Sister   . Anxiety disorder Sister   . Depression  Sister   . Drug abuse Brother   . Alcohol abuse Brother   . Alcohol abuse Sister   . Alcohol abuse Sister   . Depression Sister   . Anxiety disorder Sister   . Drug abuse Brother   . Alcohol abuse Brother    History  Substance Use Topics  . Smoking status: Current Every Day Smoker -- 1.50 packs/day for 38 years    Types: Cigarettes  . Smokeless tobacco: Never Used  . Alcohol Use: No    Review of Systems  Cardiovascular: Positive for chest pain.  All other systems reviewed and are negative.     Allergies  Crestor; Ace inhibitors; and Clopidogrel bisulfate  Home Medications   Prior to Admission medications   Medication Sig Start Date End Date Taking? Authorizing Provider  Albuterol Sulfate (PROAIR RESPICLICK) 349 (90 BASE) MCG/ACT AEPB Inhale 1 puff into the lungs 4 (four) times daily as needed. 08/11/14   [name], MD   ALPRAZolam Duanne Moron) 1 MG tablet Take 1 tablet (1 mg total) by mouth 2 (two) times daily as needed for anxiety. 07/08/14   [name], MD  Blood Glucose Calibration (ELEMENT CONTROL) NORMAL LIQD as directed.      Historical Provider, MD  Blood Glucose Monitoring Suppl ([place]) W/DEVICE KIT      Historical Provider, MD  carvedilol (COREG) 12.5 MG tablet take 1 tablet by mouth twice a day with meals 12/14/13   [name], MD  cyclobenzaprine (FLEXERIL) 10 MG tablet Take 1 tablet (10 mg total) by mouth at bedtime as needed for muscle spasms. 09/11/14   Melony Overly, MD  emtricitabine-tenofovir (TRUVADA) 200-300 MG per tablet Take 1 tablet by mouth daily. 07/09/14   [name], MD  esomeprazole (NEXIUM) 40 MG capsule Take 1 capsule (40 mg total) by mouth daily before breakfast. 10/05/12   [name], MD  glucose blood (ELEMENT TEST) test strip 1 each by Other route as needed. Use as instructed     Historical Provider, MD  HYDROcodone-acetaminophen (NORCO) 10-325 MG per tablet TAKE 1  TABLET BY MOUTH EVERY 4 HOURS AS NEEDED FOR PAIN with limit 4 per day 09/10/14   [name], MD  LOVAZA 1 G capsule take 2 capsules by mouth twice a day 02/04/14   [name], MD  meloxicam (MOBIC) 15 MG tablet Take 1 tablet (15 mg total) by mouth daily as needed for pain. 09/11/14   Melony Overly, MD  PARoxetine (PAXIL) 30 MG tablet TAKE 1 TABLET BY MOUTH EVERY MORNING 12/14/13   [name], MD  Pitavastatin Calcium (LIVALO) 2 MG TABS Take 1 tablet (2 mg total) by mouth daily. 07/08/14   [name], MD  QUEtiapine (SEROQUEL XR) 50 MG TB24 24 hr tablet Take 1 tablet (50 mg total) by mouth at bedtime. 07/08/14   [name], MD  SPIRIVA HANDIHALER 18 MCG inhalation capsule PLACE 1 CAPSULE INTO THE INHALER AND INHALE DAILY 09/12/13   [name], MD  traZODone (DESYREL) 100 MG tablet take 3 tablets by mouth at bedtime 08/11/14   [name], MD   BP 152/84 mmHg  Pulse 91   Temp(Src) 98.2 F (36.8 C) (Oral)  Resp 20  Ht '6\' 3"'  (1.905 m)  Wt 189 lb (85.73 kg)  BMI 23.62 kg/m2  SpO2 98% Physical Exam  Constitutional: He appears well-developed and well-nourished.  HENT:  Head: Normocephalic and atraumatic.  Mouth/Throat: Oropharynx  is clear and moist.  Neck: Normal range of motion. Neck supple.  Cardiovascular: Regular rhythm, normal heart sounds and intact distal pulses.  Tachycardia present.   Pulses:      Dorsalis pedis pulses are 2+ on the right side, and 2+ on the left side.  Pulmonary/Chest: Effort normal and breath sounds normal. No respiratory distress. He has no wheezes. He has no rales.  Abdominal: Soft. Bowel sounds are normal. He exhibits no distension and no mass. There is no tenderness. There is no rebound and no guarding.  Musculoskeletal: Normal range of motion.  No LE edema or erythema bilaterally  Neurological: He is alert.  Skin: Skin is warm and dry. He is not diaphoretic.  Psychiatric: He has a normal mood and affect.  Nursing note and vitals reviewed.   ED Course  Procedures (including critical care time) Labs Review Labs Reviewed  CBC  BASIC METABOLIC PANEL  Randolm Idol, ED    Imaging Review Dg Chest 2 View  09/15/2014   CLINICAL DATA:  Acute onset of left-sided chest pressure. Initial encounter.  EXAM: CHEST  2 VIEW  COMPARISON:  Chest radiograph from 10/04/2012  FINDINGS: The lungs are well-aerated. Chronic peribronchial thickening is noted. Mild bibasilar atelectasis is seen. There is no evidence of pleural effusion or pneumothorax.  The heart is normal in size; the mediastinal contour is within normal limits. No acute osseous abnormalities are seen.  IMPRESSION: Chronic peribronchial thickening noted. Mild bibasilar atelectasis seen.   Electronically Signed   By: Garald Balding M.D.   On: 09/15/2014 06:51     EKG Interpretation   Date/Time:  Monday September 15 2014 05:55:29 EST Ventricular Rate:  114 PR Interval:   141 QRS Duration: 92 QT Interval:  334 QTC Calculation: 460 R Axis:   -49 Text Interpretation:  Sinus tachycardia LAE, consider biatrial enlargement  LAD, consider left anterior fascicular block ED PHYSICIAN INTERPRETATION  AVAILABLE IN [facility] Confirmed by TEST, Record ([id number]) on  09/17/2014 7:44:30 AM     8:00 AM Reassessed patient.  He reports that his pain is a "whole lot better" at this time. 9:53 AM Reassessed patient.  He reports that he is feeling much better.  No CP at this time. MDM   Final diagnoses:  Chest pain   Patient presents today with chest pain.  He reports that the pain has been present for the past 8 days after helping his friend move.  He reports improvement in pain with palpation and also certain positions.  Pain is atypical.  No ischemic changes on EKG.  Initial and 3 hour troponin are negative.  History not consistent with ACS or PE.  Patient initially tachycardic, however, he is an alcoholic and likely dehydrated.  Tachycardia resolved with IVF.  Pulse ox 98 on RA.  Chest pain completely resolved after given pain medication in the ED.  No acute changes on CXR.  Feel that the patient is stable for discharge.  Patient discussed with Dr. Claudine Mouton who is in agreement with the plan.  Return precautions given.      Hyman Bible, PA-C 09/17/14 1516  Everlene Balls, MD 09/18/14 0700

## 2014-09-15 NOTE — ED Notes (Signed)
Pt to xray

## 2014-09-15 NOTE — ED Notes (Signed)
Pt arrives with left chest pain ongoing since last Sunday, states they diagnosed him with muscular strain and given RX aleve and flexaril, not effective. Aching pain in left chest that goes back into shoulder. 2/10.

## 2014-09-24 ENCOUNTER — Other Ambulatory Visit: Payer: Self-pay | Admitting: *Deleted

## 2014-09-30 ENCOUNTER — Ambulatory Visit: Payer: PRIVATE HEALTH INSURANCE | Admitting: Podiatry

## 2014-10-07 ENCOUNTER — Encounter: Payer: Self-pay | Admitting: Internal Medicine

## 2014-10-08 ENCOUNTER — Other Ambulatory Visit: Payer: Self-pay | Admitting: Internal Medicine

## 2014-10-08 DIAGNOSIS — M545 Low back pain, unspecified: Secondary | ICD-10-CM

## 2014-10-08 DIAGNOSIS — M170 Bilateral primary osteoarthritis of knee: Secondary | ICD-10-CM

## 2014-10-08 MED ORDER — HYDROCODONE-ACETAMINOPHEN 10-325 MG PO TABS: ORAL_TABLET | ORAL | Status: DC

## 2014-10-14 ENCOUNTER — Encounter: Payer: Self-pay | Admitting: Internal Medicine

## 2014-10-14 ENCOUNTER — Ambulatory Visit: Payer: Self-pay | Admitting: Internal Medicine

## 2014-10-15 DIAGNOSIS — J449 Chronic obstructive pulmonary disease, unspecified: Secondary | ICD-10-CM | POA: Diagnosis not present

## 2014-10-20 ENCOUNTER — Ambulatory Visit: Payer: Self-pay | Admitting: Internal Medicine

## 2014-10-23 ENCOUNTER — Encounter: Payer: Self-pay | Admitting: Internal Medicine

## 2014-10-23 ENCOUNTER — Ambulatory Visit (INDEPENDENT_AMBULATORY_CARE_PROVIDER_SITE_OTHER): Payer: Medicare Other | Admitting: Internal Medicine

## 2014-10-23 VITALS — BP 118/62 | HR 88 | Temp 98.1°F | Resp 16 | Ht 75.0 in | Wt 188.0 lb

## 2014-10-23 DIAGNOSIS — J209 Acute bronchitis, unspecified: Secondary | ICD-10-CM | POA: Diagnosis not present

## 2014-10-23 DIAGNOSIS — J441 Chronic obstructive pulmonary disease with (acute) exacerbation: Secondary | ICD-10-CM | POA: Diagnosis not present

## 2014-10-23 DIAGNOSIS — I10 Essential (primary) hypertension: Secondary | ICD-10-CM | POA: Diagnosis not present

## 2014-10-23 MED ORDER — AZITHROMYCIN 500 MG PO TABS: 500.0000 mg | ORAL_TABLET | Freq: Every day | ORAL | Status: DC

## 2014-10-23 NOTE — Progress Notes (Signed)
Pre visit review using our clinic review tool, if applicable. No additional management support is needed unless otherwise documented below in the visit note. 

## 2014-10-23 NOTE — Progress Notes (Signed)
Subjective:    Patient ID: [name], male    DOB: 09-Apr-1961, 54 y.o.   MRN: [id number]  Cough This is a new problem. The current episode started 1 to 4 weeks ago. The problem has been unchanged. The problem occurs every few hours. The cough is productive of purulent sputum. Associated symptoms include chills and a sore throat. Pertinent negatives include no chest pain, ear congestion, ear pain, fever, headaches, heartburn, hemoptysis, myalgias, nasal congestion, postnasal drip, rash, rhinorrhea, shortness of breath, sweats, weight loss or wheezing. He has tried prescription cough suppressant for the symptoms. The treatment provided moderate relief. His past medical history is significant for bronchitis, COPD and emphysema. There is no history of asthma, bronchiectasis, environmental allergies or pneumonia.      Review of Systems  Constitutional: Positive for chills. Negative for fever, weight loss, diaphoresis and fatigue.  HENT: Positive for sore throat. Negative for ear pain, postnasal drip, rhinorrhea, sinus pressure and trouble swallowing.   Eyes: Negative.   Respiratory: Positive for cough. Negative for hemoptysis, choking, chest tightness, shortness of breath, wheezing and stridor.   Cardiovascular: Negative.  Negative for chest pain, palpitations and leg swelling.  Gastrointestinal: Negative.  Negative for heartburn, nausea, vomiting, abdominal pain, diarrhea, constipation and blood in stool.  Endocrine: Negative.   Genitourinary: Negative.   Musculoskeletal: Negative.  Negative for myalgias.  Skin: Negative.  Negative for rash.  Allergic/Immunologic: Negative.  Negative for environmental allergies.  Neurological: Negative.  Negative for headaches.  Hematological: Negative.  Negative for adenopathy. Does not bruise/bleed easily.  Psychiatric/Behavioral: Negative.        Objective:   Physical Exam  Constitutional: He is oriented to person, place, and time. He appears  well-developed and well-nourished.  Non-toxic appearance. He does not have a sickly appearance. He does not appear ill. No distress.  HENT:  Head: Normocephalic and atraumatic.  Mouth/Throat: Oropharynx is clear and moist. No oropharyngeal exudate.  Eyes: Conjunctivae are normal. Right eye exhibits no discharge. Left eye exhibits no discharge. No scleral icterus.  Neck: Normal range of motion. Neck supple. No JVD present. No tracheal deviation present. No thyromegaly present.  Cardiovascular: Normal rate, regular rhythm, normal heart sounds and intact distal pulses.  Exam reveals no gallop and no friction rub.   No murmur heard. Pulmonary/Chest: Effort normal and breath sounds normal. No stridor. No respiratory distress. He has no wheezes. He has no rales. He exhibits no tenderness.  Abdominal: Soft. Bowel sounds are normal. He exhibits no distension and no mass. There is no tenderness. There is no rebound and no guarding.  Musculoskeletal: Normal range of motion. He exhibits no edema or tenderness.  Lymphadenopathy:    He has no cervical adenopathy.  Neurological: He is oriented to person, place, and time.  Skin: Skin is warm and dry. No rash noted. He is not diaphoretic. No erythema. No pallor.  Vitals reviewed.     Lab Results  Component Value Date   WBC 6.3 09/15/2014   HGB 19.3* 09/15/2014   HCT 54.1* 09/15/2014   PLT 110* 09/15/2014   GLUCOSE 108* 09/15/2014   CHOL 208* 07/08/2014   TRIG 88.0 07/08/2014   HDL 47.60 07/08/2014   LDLDIRECT 71.2 09/29/2011   LDLCALC 143* 07/08/2014   ALT 40 07/08/2014   AST 30 07/08/2014   NA 140 09/15/2014   K 4.4 09/15/2014   CL 103 09/15/2014   CREATININE 0.73 09/15/2014   BUN 8 09/15/2014   CO2 23 09/15/2014  TSH 1.18 07/08/2014   PSA 0.69 12/05/2013   INR 1.0 12/19/2008   HGBA1C 5.8 07/08/2014   MICROALBUR 0.2 10/08/2008  Dg Chest 2 View  09/15/2014   CLINICAL DATA:  Acute onset of left-sided chest pressure. Initial encounter.   EXAM: CHEST  2 VIEW  COMPARISON:  Chest radiograph from 10/04/2012  FINDINGS: The lungs are well-aerated. Chronic peribronchial thickening is noted. Mild bibasilar atelectasis is seen. There is no evidence of pleural effusion or pneumothorax.  The heart is normal in size; the mediastinal contour is within normal limits. No acute osseous abnormalities are seen.  IMPRESSION: Chronic peribronchial thickening noted. Mild bibasilar atelectasis seen.   Electronically Signed   By: Garald Balding M.D.   On: 09/15/2014 06:51     Assessment & Plan:

## 2014-10-23 NOTE — Assessment & Plan Note (Signed)
This is well controlled on his current regimen

## 2014-10-23 NOTE — Patient Instructions (Signed)

## 2014-10-23 NOTE — Assessment & Plan Note (Signed)
His BP is well controlled 

## 2014-10-23 NOTE — Assessment & Plan Note (Signed)
I will treat the infection with zithromax 

## 2014-10-24 ENCOUNTER — Telehealth: Payer: Self-pay | Admitting: Internal Medicine

## 2014-10-24 NOTE — Telephone Encounter (Signed)
emmi emailed °

## 2014-10-28 IMAGING — CR DG CERVICAL SPINE COMPLETE 4+V
5 series · 5 of 5 positions shown · non-contrast
Comparison: None.

CLINICAL DATA: Extremity pain.

EXAM:
CERVICAL SPINE  4+ VIEWS

[view not recorded (1 of 5)]
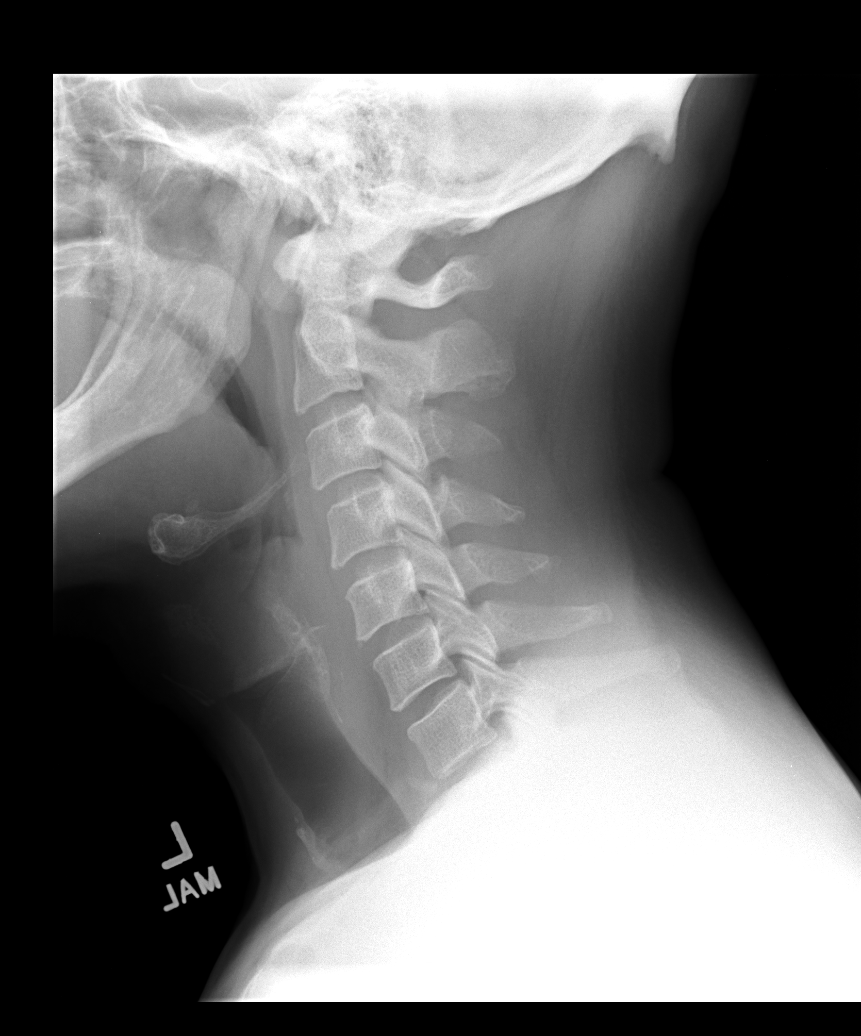

[view not recorded (2 of 5)]
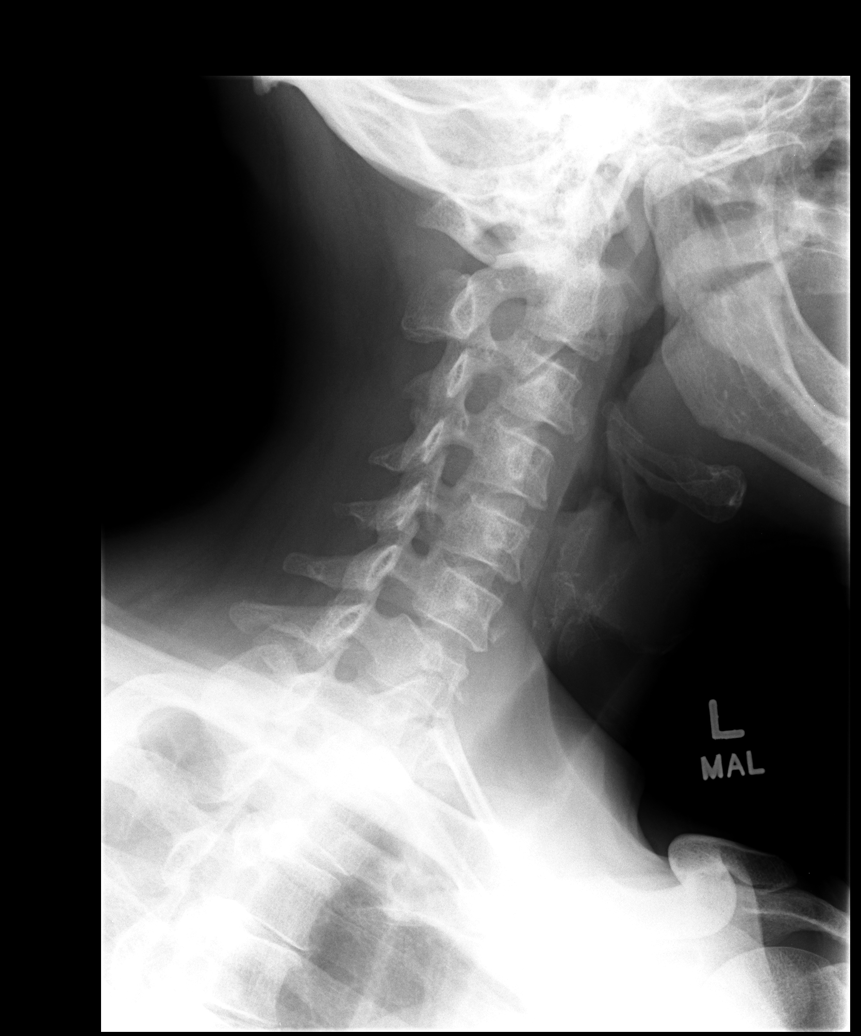

[view not recorded (3 of 5)]
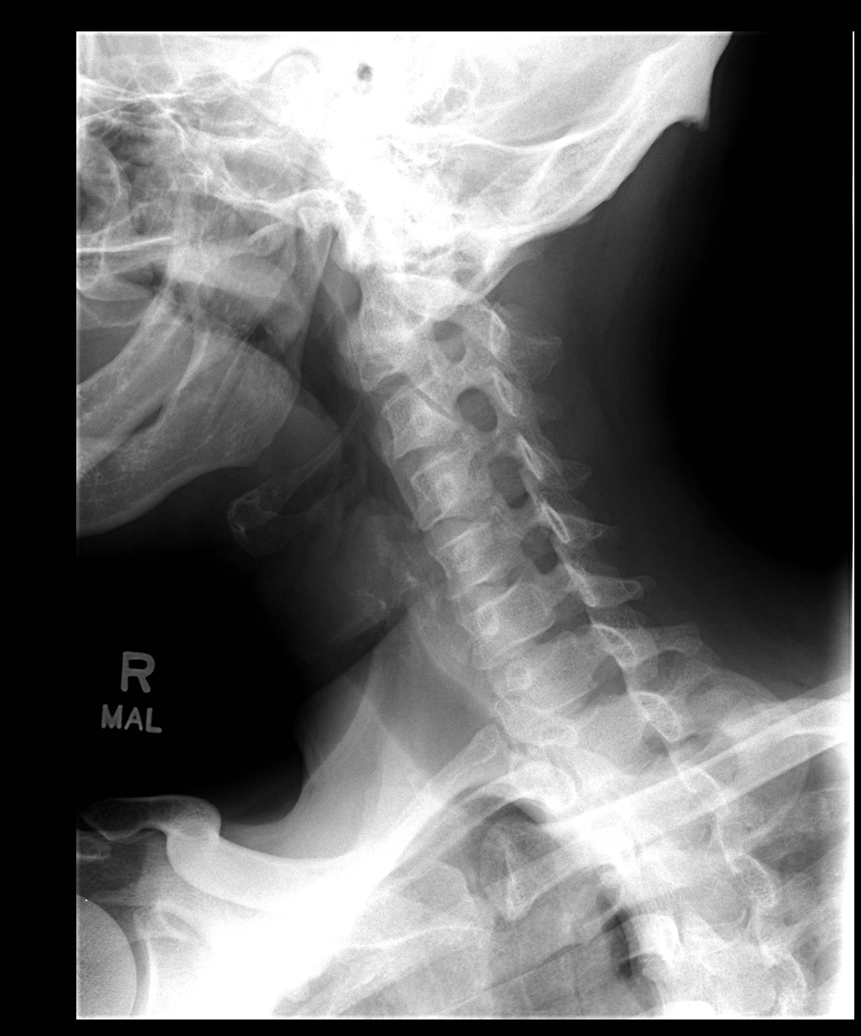

[view not recorded (4 of 5)]
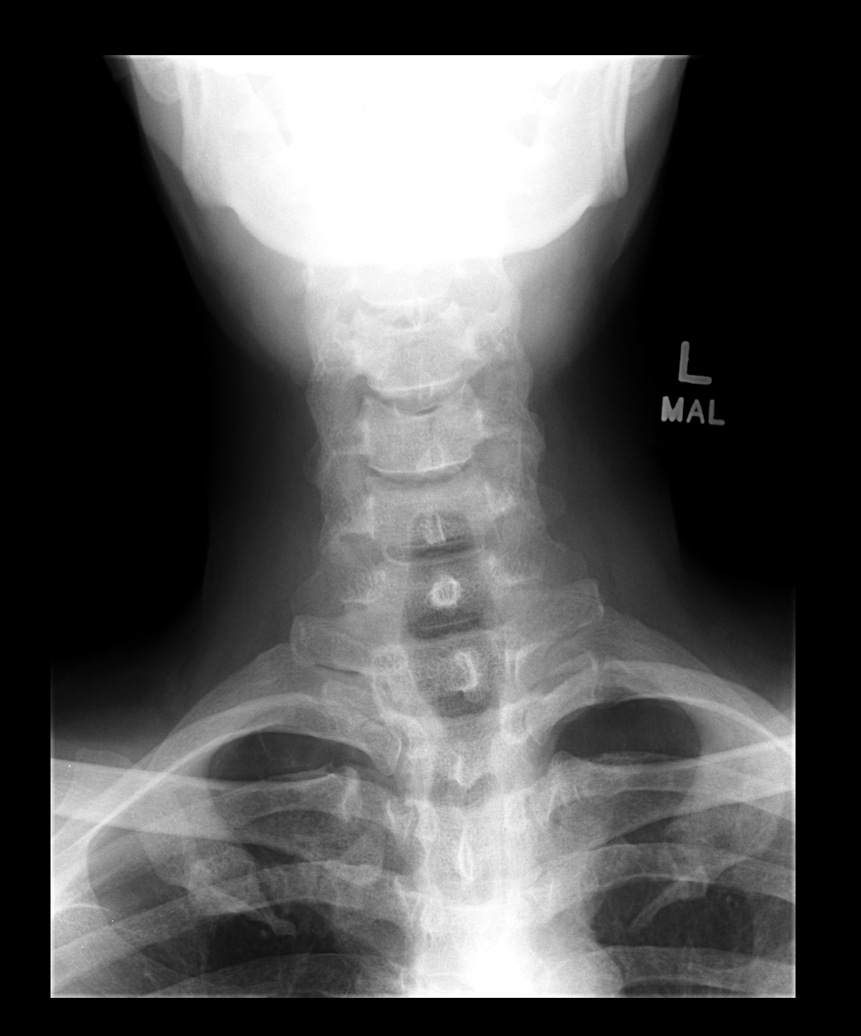

[view not recorded (5 of 5)]
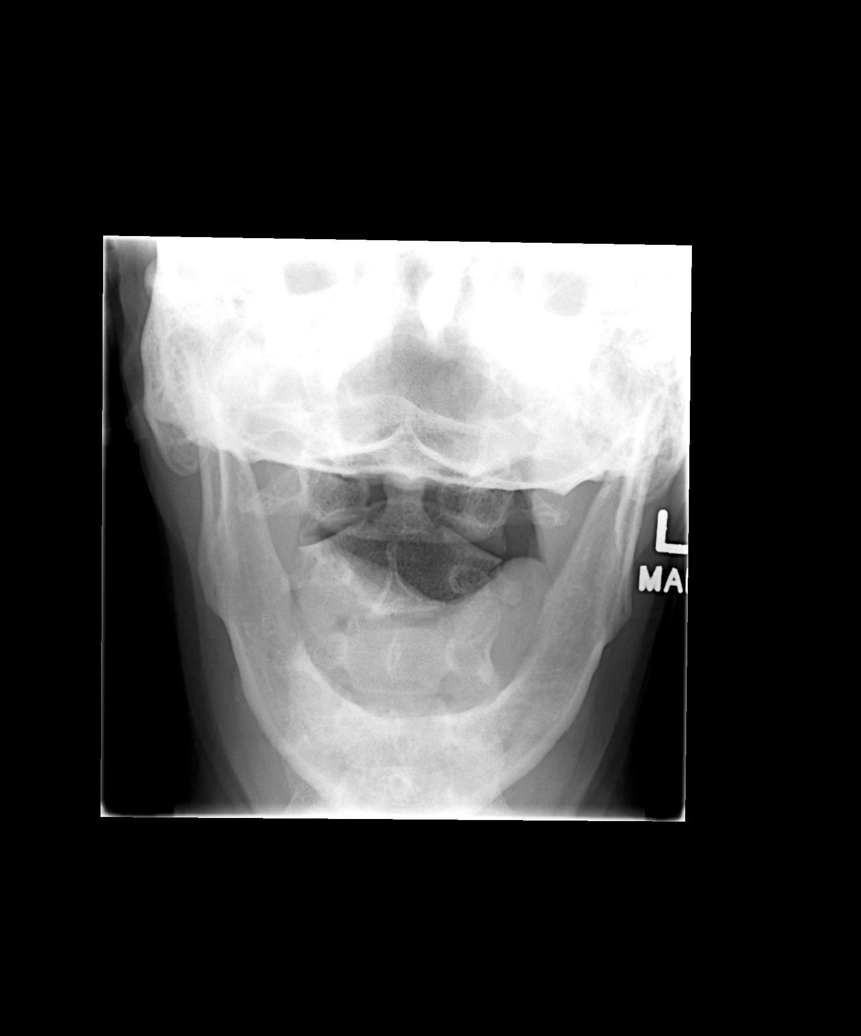

[5 of 5 positions shown; findings below may reference images not displayed]

FINDINGS: There is no evidence of cervical spine fracture or prevertebral soft
tissue swelling. Alignment is normal. No other significant bone
abnormalities are identified.
IMPRESSION: Negative cervical spine radiographs.

## 2014-10-28 IMAGING — CR DG SHOULDER 2+V*R*
3 series · 3 of 3 positions shown · non-contrast
Comparison: None.

CLINICAL DATA: 52-year-old male with right shoulder pain. Extremity
pain. Initial encounter.

EXAM:
RIGHT SHOULDER - 2+ VIEW

[view not recorded (1 of 3)]
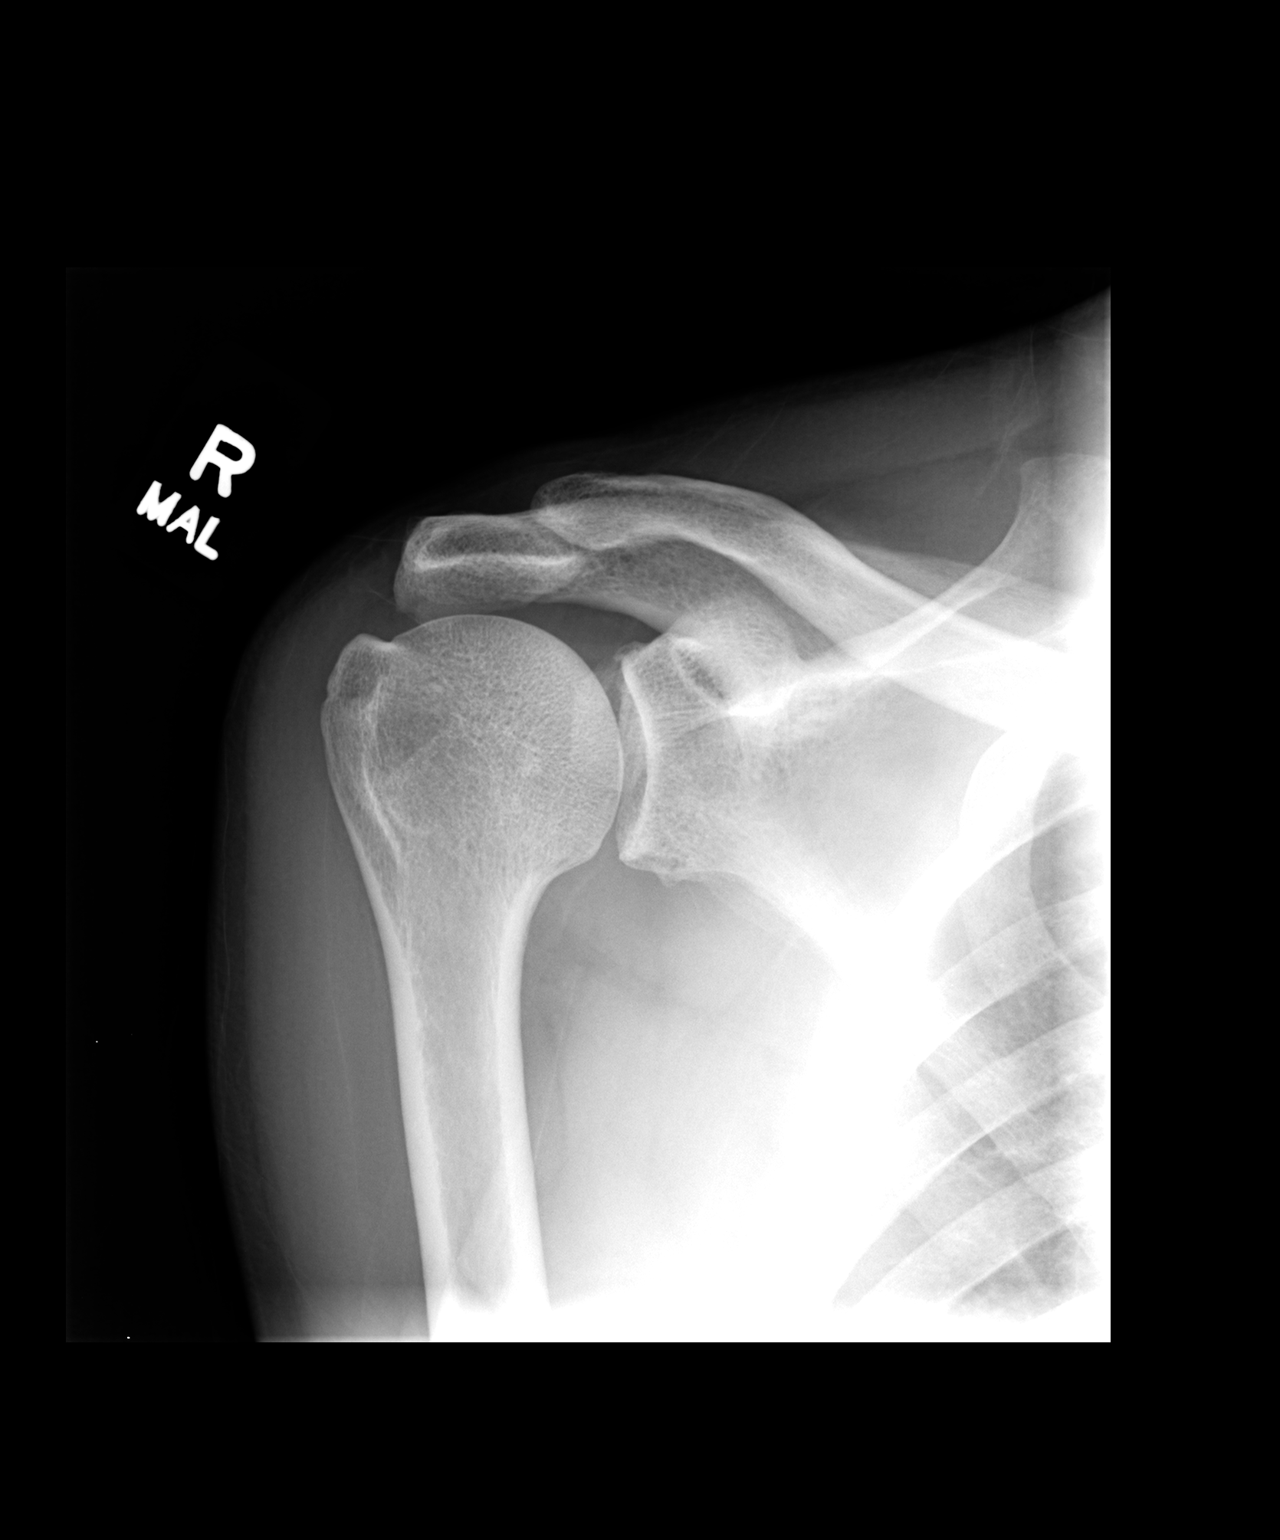

[view not recorded (2 of 3)]
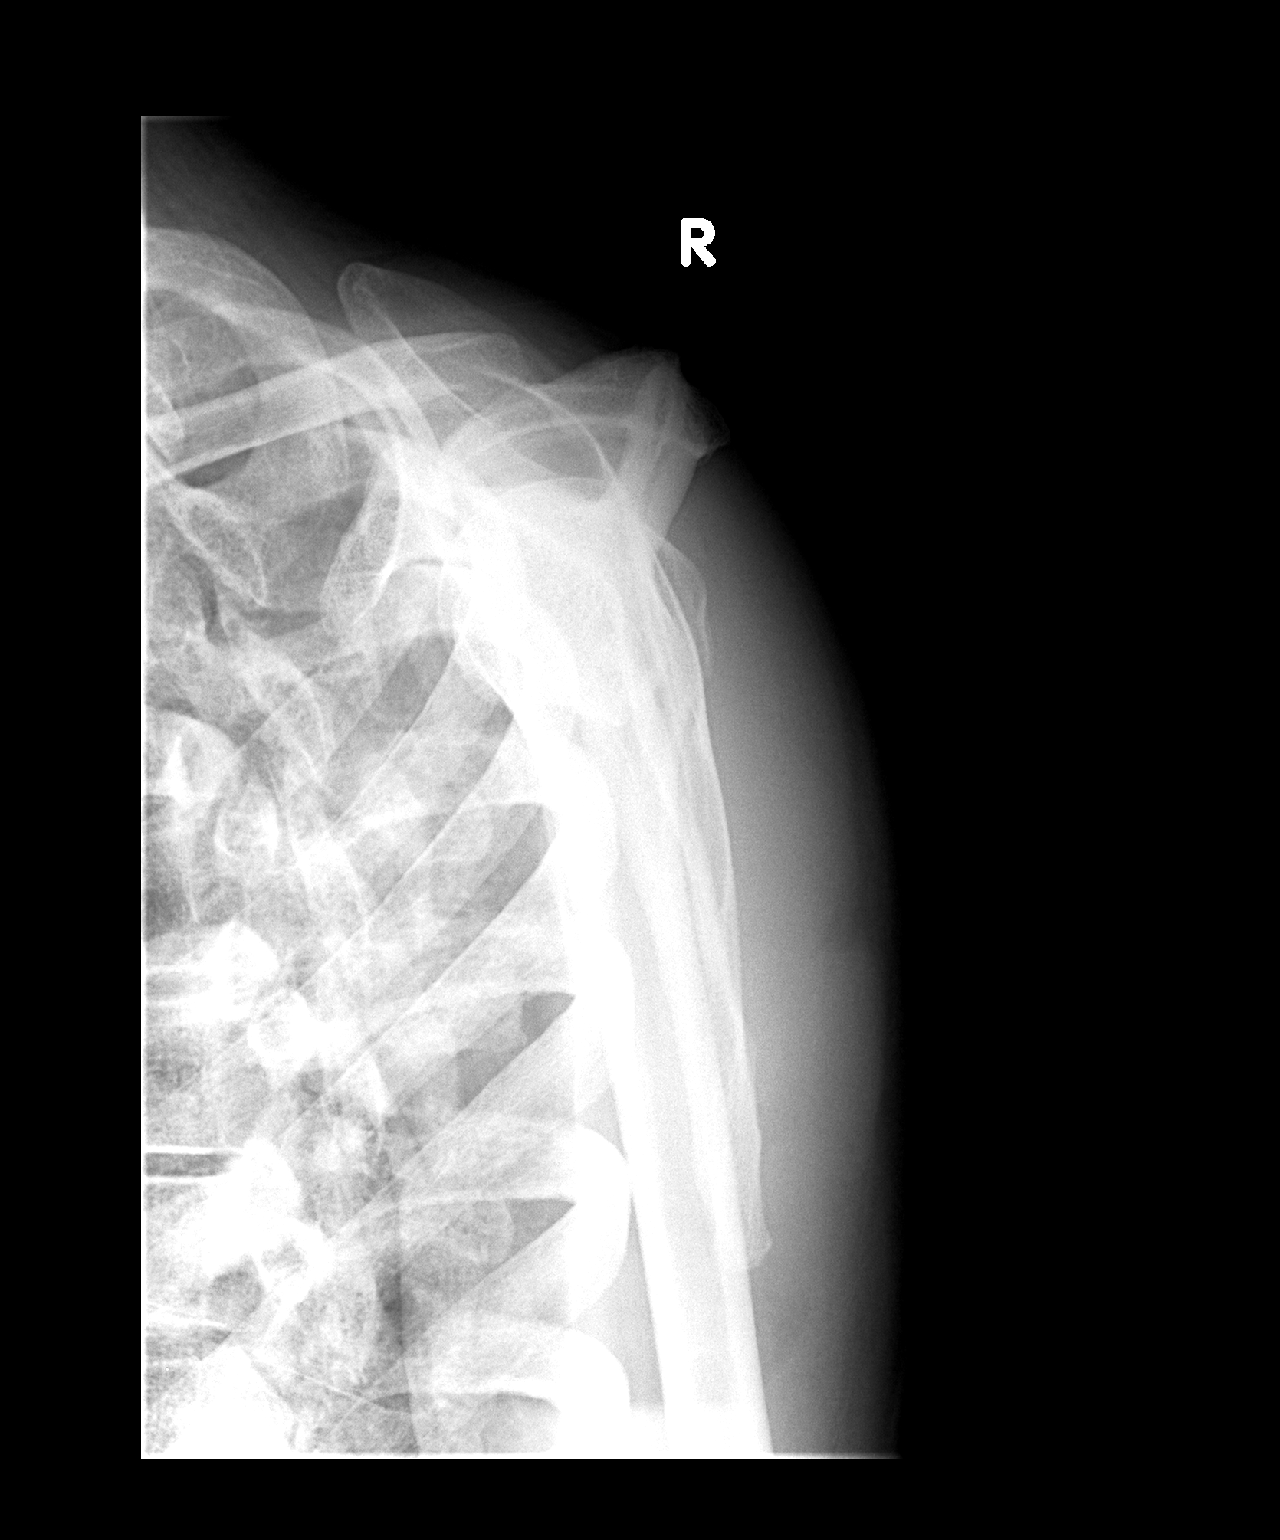

[view not recorded (3 of 3)]
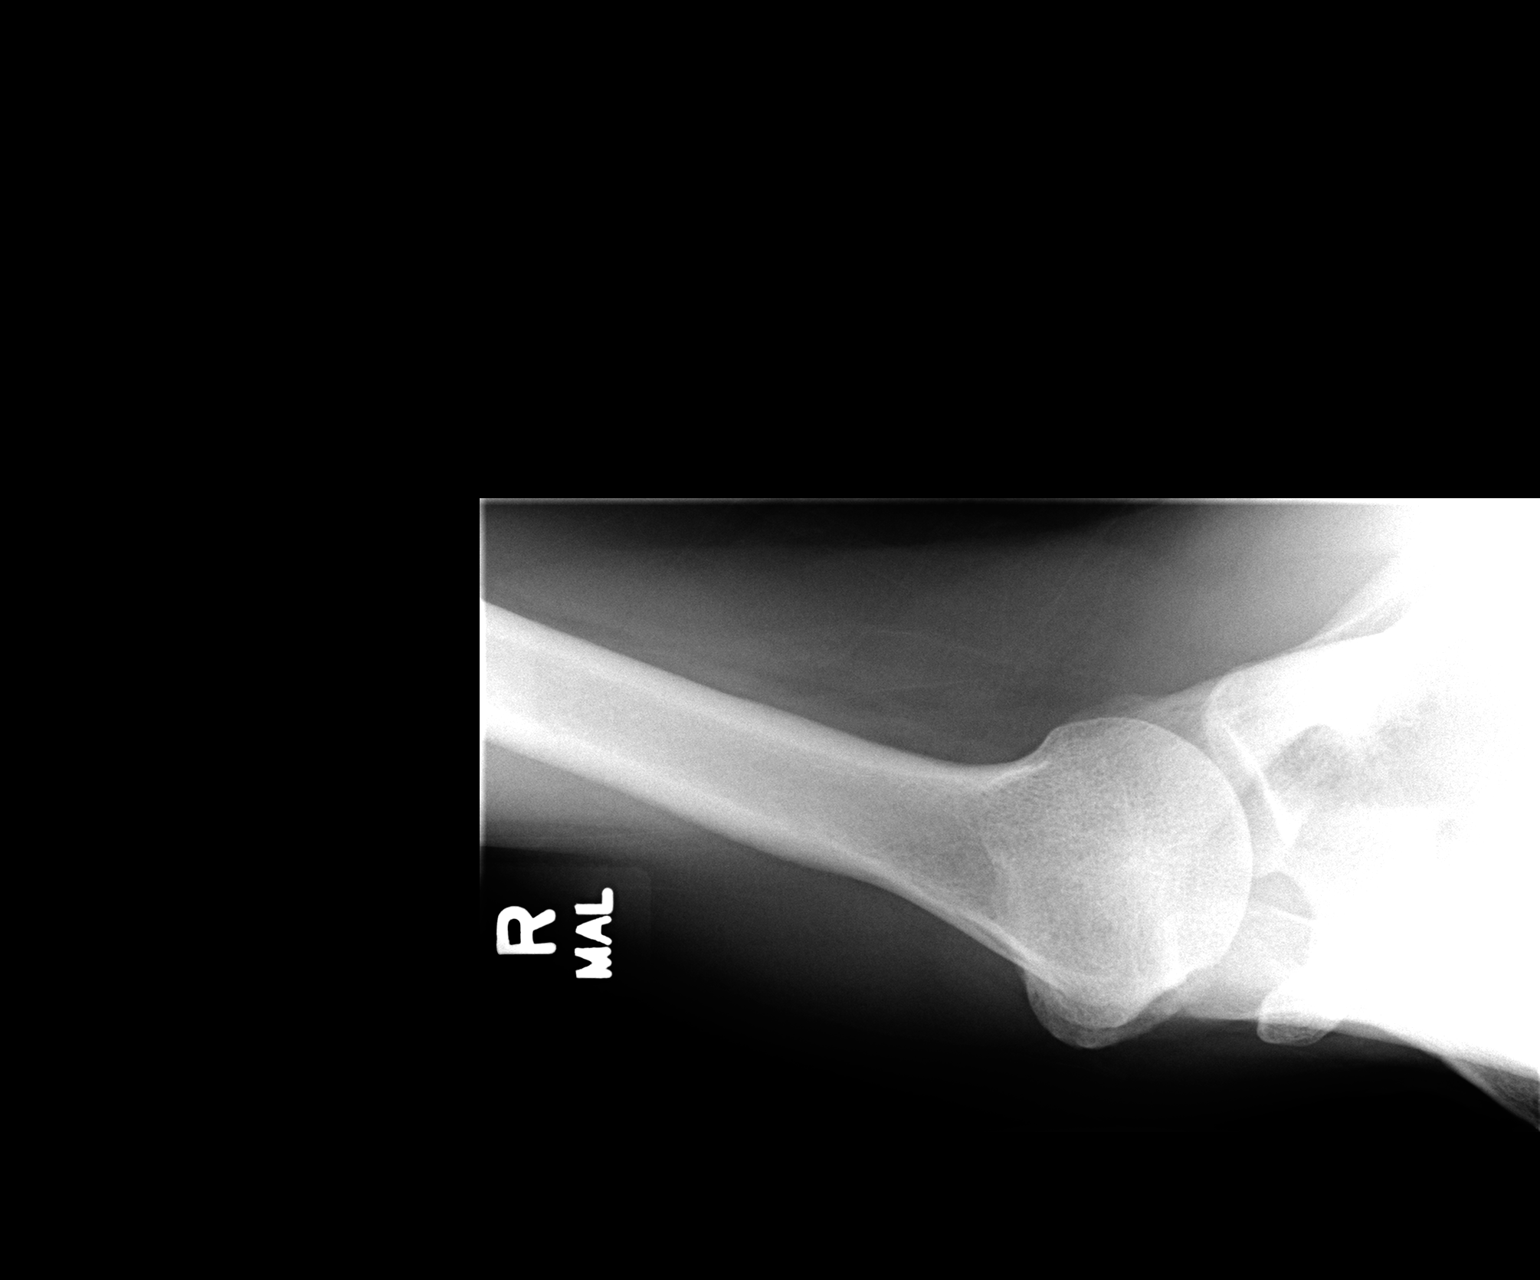

[3 of 3 positions shown; findings below may reference images not displayed]

FINDINGS: Bone mineralization is within normal limits. No glenohumeral joint
dislocation. Proximal right humerus intact. Visible right clavicle
intact. No scapula fracture identified. Visible right ribs appear
intact.
IMPRESSION: No acute osseous abnormality identified at the right shoulder

## 2014-11-03 ENCOUNTER — Encounter (INDEPENDENT_AMBULATORY_CARE_PROVIDER_SITE_OTHER): Payer: Self-pay

## 2014-11-03 ENCOUNTER — Other Ambulatory Visit: Payer: Self-pay | Admitting: Internal Medicine

## 2014-11-03 DIAGNOSIS — M545 Low back pain, unspecified: Secondary | ICD-10-CM

## 2014-11-03 DIAGNOSIS — M170 Bilateral primary osteoarthritis of knee: Secondary | ICD-10-CM

## 2014-11-03 DIAGNOSIS — F418 Other specified anxiety disorders: Secondary | ICD-10-CM

## 2014-11-03 MED ORDER — HYDROCODONE-ACETAMINOPHEN 10-325 MG PO TABS: ORAL_TABLET | ORAL | Status: DC

## 2014-11-03 MED ORDER — ALPRAZOLAM 1 MG PO TABS: 1.0000 mg | ORAL_TABLET | Freq: Two times a day (BID) | ORAL | Status: DC | PRN

## 2014-11-03 MED ORDER — TIOTROPIUM BROMIDE MONOHYDRATE 18 MCG IN CAPS: ORAL_CAPSULE | RESPIRATORY_TRACT | Status: DC

## 2014-11-03 NOTE — Telephone Encounter (Signed)
Rx fax to [facility]...Johny Chess

## 2014-11-03 NOTE — Addendum Note (Signed)
Addended by: Estell Harpin T on: 11/03/2014 02:29 PM   Modules accepted: Orders

## 2014-11-03 NOTE — Telephone Encounter (Signed)
Left msg on triage requesting refill on his xanax...[name]

## 2014-11-03 NOTE — Telephone Encounter (Signed)
Pt call left msg on triage needing refill on his xanax...[name]

## 2014-12-01 ENCOUNTER — Telehealth: Payer: Self-pay

## 2014-12-01 MED ORDER — PITAVASTATIN CALCIUM 2 MG PO TABS: 1.0000 | ORAL_TABLET | Freq: Every day | ORAL | Status: DC

## 2014-12-01 NOTE — Telephone Encounter (Signed)
Received 90 day request from new [place] for [place]. Rx sent and old pharmacy removed

## 2014-12-02 ENCOUNTER — Encounter: Payer: Self-pay | Admitting: Internal Medicine

## 2014-12-02 ENCOUNTER — Other Ambulatory Visit: Payer: Self-pay | Admitting: Internal Medicine

## 2014-12-02 DIAGNOSIS — M170 Bilateral primary osteoarthritis of knee: Secondary | ICD-10-CM

## 2014-12-02 DIAGNOSIS — M545 Low back pain, unspecified: Secondary | ICD-10-CM

## 2014-12-03 MED ORDER — HYDROCODONE-ACETAMINOPHEN 10-325 MG PO TABS: ORAL_TABLET | ORAL | Status: DC

## 2014-12-04 ENCOUNTER — Encounter (HOSPITAL_COMMUNITY): Payer: Self-pay | Admitting: Nurse Practitioner

## 2014-12-04 ENCOUNTER — Emergency Department (HOSPITAL_COMMUNITY): Payer: Medicare Other

## 2014-12-04 ENCOUNTER — Emergency Department (HOSPITAL_COMMUNITY)
Admission: EM | Admit: 2014-12-04 | Discharge: 2014-12-04 | Disposition: A | Discharge status: Home / Self Care | Payer: Medicare Other | Attending: Emergency Medicine | Admitting: Emergency Medicine

## 2014-12-04 DIAGNOSIS — R195 Other fecal abnormalities: Secondary | ICD-10-CM | POA: Diagnosis not present

## 2014-12-04 DIAGNOSIS — K279 Peptic ulcer, site unspecified, unspecified as acute or chronic, without hemorrhage or perforation: Secondary | ICD-10-CM | POA: Diagnosis not present

## 2014-12-04 DIAGNOSIS — Z8659 Personal history of other mental and behavioral disorders: Secondary | ICD-10-CM | POA: Insufficient documentation

## 2014-12-04 DIAGNOSIS — R531 Weakness: Secondary | ICD-10-CM | POA: Diagnosis not present

## 2014-12-04 DIAGNOSIS — E119 Type 2 diabetes mellitus without complications: Secondary | ICD-10-CM | POA: Insufficient documentation

## 2014-12-04 DIAGNOSIS — Z791 Long term (current) use of non-steroidal anti-inflammatories (NSAID): Secondary | ICD-10-CM | POA: Insufficient documentation

## 2014-12-04 DIAGNOSIS — Z86010 Personal history of colonic polyps: Secondary | ICD-10-CM | POA: Insufficient documentation

## 2014-12-04 DIAGNOSIS — K625 Hemorrhage of anus and rectum: Secondary | ICD-10-CM

## 2014-12-04 DIAGNOSIS — K3189 Other diseases of stomach and duodenum: Secondary | ICD-10-CM | POA: Diagnosis not present

## 2014-12-04 DIAGNOSIS — Z862 Personal history of diseases of the blood and blood-forming organs and certain disorders involving the immune mechanism: Secondary | ICD-10-CM | POA: Insufficient documentation

## 2014-12-04 DIAGNOSIS — Z8619 Personal history of other infectious and parasitic diseases: Secondary | ICD-10-CM | POA: Diagnosis not present

## 2014-12-04 DIAGNOSIS — K6289 Other specified diseases of anus and rectum: Secondary | ICD-10-CM | POA: Diagnosis present

## 2014-12-04 DIAGNOSIS — K922 Gastrointestinal hemorrhage, unspecified: Secondary | ICD-10-CM | POA: Diagnosis not present

## 2014-12-04 DIAGNOSIS — Z792 Long term (current) use of antibiotics: Secondary | ICD-10-CM | POA: Diagnosis not present

## 2014-12-04 DIAGNOSIS — R404 Transient alteration of awareness: Secondary | ICD-10-CM | POA: Diagnosis not present

## 2014-12-04 DIAGNOSIS — R000 Tachycardia, unspecified: Secondary | ICD-10-CM | POA: Insufficient documentation

## 2014-12-04 DIAGNOSIS — J441 Chronic obstructive pulmonary disease with (acute) exacerbation: Secondary | ICD-10-CM | POA: Insufficient documentation

## 2014-12-04 DIAGNOSIS — I10 Essential (primary) hypertension: Secondary | ICD-10-CM | POA: Insufficient documentation

## 2014-12-04 DIAGNOSIS — K760 Fatty (change of) liver, not elsewhere classified: Secondary | ICD-10-CM | POA: Diagnosis not present

## 2014-12-04 DIAGNOSIS — K219 Gastro-esophageal reflux disease without esophagitis: Secondary | ICD-10-CM | POA: Diagnosis not present

## 2014-12-04 DIAGNOSIS — Z79899 Other long term (current) drug therapy: Secondary | ICD-10-CM | POA: Insufficient documentation

## 2014-12-04 LAB — COMPREHENSIVE METABOLIC PANEL
ALBUMIN: 3.7 g/dL (ref 3.5–5.2)
ALT: 39 U/L (ref 0–53)
ANION GAP: 9 (ref 5–15)
AST: 50 U/L — AB (ref 0–37)
Alkaline Phosphatase: 55 U/L (ref 39–117)
BILIRUBIN TOTAL: 1.6 mg/dL — AB (ref 0.3–1.2)
BUN: 9 mg/dL (ref 6–23)
CALCIUM: 8.5 mg/dL (ref 8.4–10.5)
CHLORIDE: 106 mmol/L (ref 96–112)
CO2: 23 mmol/L (ref 19–32)
Creatinine, Ser: 0.78 mg/dL (ref 0.50–1.35)
GFR calc Af Amer: >90 mL/min (ref >90)
GFR calc non Af Amer: >90 mL/min (ref >90)
Glucose, Bld: 100 mg/dL — ABNORMAL HIGH (ref 70–99)
Potassium: 5 mmol/L (ref 3.5–5.1)
Sodium: 138 mmol/L (ref 135–145)
TOTAL PROTEIN: 7.2 g/dL (ref 6.0–8.3)

## 2014-12-04 LAB — CBC WITH DIFFERENTIAL/PLATELET
BASOS ABS: 0 10*3/uL (ref 0.0–0.1)
Basophils Relative: 0 % (ref 0–1)
EOS ABS: 0.1 10*3/uL (ref 0.0–0.7)
Eosinophils Relative: 1 % (ref 0–5)
HCT: 48.8 % (ref 39.0–52.0)
Hemoglobin: 17.5 g/dL — ABNORMAL HIGH (ref 13.0–17.0)
LYMPHS ABS: 3.2 10*3/uL (ref 0.7–4.0)
LYMPHS PCT: 36 % (ref 12–46)
MCH: 34.4 pg — ABNORMAL HIGH (ref 26.0–34.0)
MCHC: 35.9 g/dL (ref 30.0–36.0)
MCV: 95.9 fL (ref 78.0–100.0)
MONOS PCT: 9 % (ref 3–12)
Monocytes Absolute: 0.8 10*3/uL (ref 0.1–1.0)
NEUTROS PCT: 54 % (ref 43–77)
Neutro Abs: 4.7 10*3/uL (ref 1.7–7.7)
Platelets: 152 10*3/uL (ref 150–400)
RBC: 5.09 MIL/uL (ref 4.22–5.81)
RDW: 13.3 % (ref 11.5–15.5)
WBC: 8.8 10*3/uL (ref 4.0–10.5)

## 2014-12-04 LAB — LIPASE, BLOOD: Lipase: 27 U/L (ref 11–59)

## 2014-12-04 LAB — PROTIME-INR
INR: 0.94 (ref 0.00–1.49)
Prothrombin Time: 12.6 seconds (ref 11.6–15.2)

## 2014-12-04 LAB — TYPE AND SCREEN
ABO/RH(D): O POS
Antibody Screen: NEGATIVE

## 2014-12-04 MED ORDER — PANTOPRAZOLE SODIUM 40 MG IV SOLR
40.0000 mg | Freq: Once | INTRAVENOUS | Status: AC
  Administered 2014-12-04: 40 mg via INTRAVENOUS
  Filled 2014-12-04: qty 40

## 2014-12-04 MED ORDER — SODIUM CHLORIDE 0.9 % IV SOLN
8.0000 mg/h | INTRAVENOUS | Status: DC
  Administered 2014-12-04: 8 mg/h via INTRAVENOUS
  Filled 2014-12-04 (×2): qty 80

## 2014-12-04 MED ORDER — IOHEXOL 300 MG/ML  SOLN
100.0000 mL | Freq: Once | INTRAMUSCULAR | Status: AC | PRN
  Administered 2014-12-04: 100 mL via INTRAVENOUS

## 2014-12-04 MED ORDER — SODIUM CHLORIDE 0.9 % IV SOLN
1000.0000 mL | Freq: Once | INTRAVENOUS | Status: AC
  Administered 2014-12-04: 1000 mL via INTRAVENOUS

## 2014-12-04 MED ORDER — IOHEXOL 300 MG/ML  SOLN
25.0000 mL | Freq: Once | INTRAMUSCULAR | Status: AC | PRN
  Administered 2014-12-04: 25 mL via ORAL

## 2014-12-04 NOTE — Discharge Instructions (Signed)
As discussed, it is important that you follow up as soon as possible with your physician for continued management of your condition.  If you develop any new, or concerning changes in your condition, please return to the emergency department immediately.  Bloody Stools Bloody stools often mean that there is a problem in the digestive tract. Your caregiver may use the term "melena" to describe black, tarry, and bad smelling stools or "hematochezia" to describe red or maroon-colored stools. Blood seen in the stool can be caused by bleeding anywhere along the intestinal tract.  A black stool usually means that blood is coming from the upper part of the gastrointestinal tract (esophagus, stomach, or small bowel). Passing maroon-colored stools or bright red blood usually means that blood is coming from lower down in the large bowel or the rectum. However, sometimes massive bleeding in the stomach or small intestine can cause bright red bloody stools.  Consuming black licorice, lead, iron pills, medicines containing bismuth subsalicylate, or blueberries can also cause black stools. Your caregiver can test black stools to see if blood is present. It is important that the cause of the bleeding be found. Treatment can then be started, and the problem can be corrected. Rectal bleeding may not be serious, but you should not assume everything is okay until you know the cause.It is very important to follow up with your caregiver or a specialist in gastrointestinal problems. CAUSES  Blood in the stools can come from various underlying causes.Often, the cause is not found during your first visit. Testing is often needed to discover the cause of bleeding in the gastrointestinal tract. Causes range from simple to serious or even life-threatening.Possible causes include:  Hemorrhoids.These are veins that are full of blood (engorged) in the rectum. They cause pain, inflammation, and may bleed.  Anal fissures.These  are areas of painful tearing which may bleed. They are often caused by passing hard stool.  Diverticulosis.These are pouches that form on the colon over time, with age, and may bleed significantly.  Diverticulitis.This is inflammation in areas with diverticulosis. It can cause pain, fever, and bloody stools, although bleeding is rare.  Proctitis and colitis. These are inflamed areas of the rectum or colon. They may cause pain, fever, and bloody stools.  Polyps and cancer. Colon cancer is a leading cause of preventable cancer death.It often starts out as precancerous polyps that can be removed during a colonoscopy, preventing progression into cancer. Sometimes, polyps and cancer may cause rectal bleeding.  Gastritis and ulcers.Bleeding from the upper gastrointestinal tract (near the stomach) may travel through the intestines and produce black, sometimes tarry, often bad smelling stools. In certain cases, if the bleeding is fast enough, the stools may not be black, but red and the condition may be life-threatening. SYMPTOMS  You may have stools that are bright red and bloody, that are normal color with blood on them, or that are dark black and tarry. In some cases, you may only have blood in the toilet bowl. Any of these cases need medical care. You may also have:  Pain at the anus or anywhere in the rectum.  Lightheadedness or feeling faint.  Extreme weakness.  Nausea or vomiting.  Fever. DIAGNOSIS Your caregiver may use the following methods to find the cause of your bleeding:  Taking a medical history. Age is important. Older people tend to develop polyps and cancer more often. If there is anal pain and a hard, large stool associated with bleeding, a tear of the anus  may be the cause. If blood drips into the toilet after a bowel movement, bleeding hemorrhoids may be the problem. The color and frequency of the bleeding are additional considerations. In most cases, the medical history  provides clues, but seldom the final answer.  A visual and finger (digital) exam. Your caregiver will inspect the anal area, looking for tears and hemorrhoids. A finger exam can provide information when there is tenderness or a growth inside. In men, the prostate is also examined.  Endoscopy. Several types of small, long scopes (endoscopes) are used to view the colon.  In the office, your caregiver may use a rigid, or more commonly, a flexible viewing sigmoidoscope. This exam is called flexible sigmoidoscopy. It is performed in 5 to 10 minutes.  A more thorough exam is accomplished with a colonoscope. It allows your caregiver to view the entire 5 to 6 foot long colon. Medicine to help you relax (sedative) is usually given for this exam. Frequently, a bleeding lesion may be present beyond the reach of the sigmoidoscope. So, a colonoscopy may be the best exam to start with. Both exams are usually done on an outpatient basis. This means the patient does not stay overnight in the hospital or surgery center.  An upper endoscopy may be needed to examine your stomach. Sedation is used and a flexible endoscope is put in your mouth, down to your stomach.  A barium enema X-ray. This is an X-ray exam. It uses liquid barium inserted by enema into the rectum. This test alone may not identify an actual bleeding point. X-rays highlight abnormal shadows, such as those made by lumps (tumors), diverticuli, or colitis. TREATMENT  Treatment depends on the cause of your bleeding.   For bleeding from the stomach or colon, the caregiver doing your endoscopy or colonoscopy may be able to stop the bleeding as part of the procedure.  Inflammation or infection of the colon can be treated with medicines.  Many rectal problems can be treated with creams, suppositories, or warm baths.  Surgery is sometimes needed.  Blood transfusions are sometimes needed if you have lost a lot of blood.  For any bleeding problem, let  your caregiver know if you take aspirin or other blood thinners regularly. HOME CARE INSTRUCTIONS   Take any medicines exactly as prescribed.  Keep your stools soft by eating a diet high in fiber. Prunes (1 to 3 a day) work well for many people.  Drink enough water and fluids to keep your urine clear or pale yellow.  Take sitz baths if advised. A sitz bath is when you sit in a bathtub with warm water for 10 to 15 minutes to soak, soothe, and cleanse the rectal area.  If enemas or suppositories are advised, be sure you know how to use them. Tell your caregiver if you have problems with this.  Monitor your bowel movements to look for signs of improvement or worsening. SEEK MEDICAL CARE IF:   You do not improve in the time expected.  Your condition worsens after initial improvement.  You develop any new symptoms. SEEK IMMEDIATE MEDICAL CARE IF:   You develop severe or prolonged rectal bleeding.  You vomit blood.  You feel weak or faint.  You have a fever. MAKE SURE YOU:  Understand these instructions.  Will watch your condition.  Will get help right away if you are not doing well or get worse. Document Released: 09/02/2002 Document Revised: 12/05/2011 Document Reviewed: 01/28/2011 [facility] Patient Information 2015 [place], [place].  This information is not intended to replace advice given to you by your health care provider. Make sure you discuss any questions you have with your health care provider. ° °

## 2014-12-04 NOTE — ED Notes (Signed)
Pt ambulating independently w/ steady gait on d/c in no acute distress, A&Ox4.D/c instructions reviewed w/ pt and family - pt and family deny any further questions or concerns at present.  

## 2014-12-04 NOTE — ED Provider Notes (Signed)
CSN: [id number]     Arrival date & time 12/04/14  1512 History   First MD Initiated Contact with Patient 12/04/14 1528     Chief Complaint  Patient presents with  . Hematochezia  . Rectal Pain   HPI  Patient presents with concern of rectal bleeding, dizziness. Patient states that for the past week he has had pain in his rectal area, inconsistently. Over the past 3 days patient has had grossly bloody stools, with associated pain in his lower abdomen. Today the patient also has dizziness. No chest pain, no dyspnea. Patient continues to have rectal intercourse in spite of his rectal bleeding.  He is sexually active with men, last anal intercourse yesterday.  He denies HIV  Past Medical History  Diagnosis Date  . PVD (peripheral vascular disease)   . Cough   . Rhinitis   . Chronic airway obstruction, not elsewhere classified   . Family history of colonic polyps   . Family history of malignant neoplasm of gastrointestinal tract   . Personal history of colonic polyps   . Dysphagia, unspecified(787.20)   . Thrombocytopenia, unspecified   . 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 Tobacco use disorder   . Lumbago   . Type II or unspecified type diabetes mellitus with unspecified complication, not stated as uncontrolled   . Hypertension     MIXED  . Hypertrophy of prostate with urinary obstruction and other lower urinary tract symptoms (LUTS)   . Asthma   . GERD (gastroesophageal reflux disease)   . Schizophrenia   . PUD (peptic ulcer disease)   . Colon polyp    Past Surgical History  Procedure Laterality Date  . Tracheostomy    . Carpal tunnel release Right     WRIST  . Iliac artery stent Left 07/2009    STENT COMMON ILIAC ARTERY. (DR. Gwenlyn Found)  . Podiatric Left 2011    FOOT SURGERY  . Coronary stent placement    . Colonoscopy  approx 2-3 years ago   Family History  Problem Relation Age of Onset  . Colon cancer  Mother   . Stroke Mother   . Colon cancer Mother   . Dementia Mother   . Diabetes      3/6 siblings  . Alcohol abuse    . Arthritis    . Hypertension    . Hyperlipidemia    . Heart disease Father   . Heart attack Father   . Alcohol abuse Father   . Colon polyps Sister   . Alcohol abuse Sister   . Anxiety disorder Sister   . Depression Sister   . Drug abuse Brother   . Alcohol abuse Brother   . Alcohol abuse Sister   . Alcohol abuse Sister   . Depression Sister   . Anxiety disorder Sister   . Drug abuse Brother   . Alcohol abuse Brother    History  Substance Use Topics  . Smoking status: Current Every Day Smoker -- 1.50 packs/day for 38 years    Types: Cigarettes  . Smokeless tobacco: Never Used  . Alcohol Use: No    Review of Systems  Constitutional:       Per HPI, otherwise negative  HENT:       Per HPI, otherwise negative  Respiratory:       Per HPI, otherwise negative  Cardiovascular:       Per HPI, otherwise negative  Gastrointestinal: Positive  for blood in stool and anal bleeding. Negative for vomiting.  Endocrine:       Negative aside from HPI  Genitourinary:       Neg aside from HPI   Musculoskeletal:       Per HPI, otherwise negative  Skin: Negative.   Neurological: Positive for weakness and light-headedness. Negative for syncope.      Allergies  Crestor; Ace inhibitors; and Clopidogrel bisulfate  Home Medications   Prior to Admission medications   Medication Sig Start Date End Date Taking? Authorizing Provider  Albuterol Sulfate (PROAIR RESPICLICK) 664 (90 BASE) MCG/ACT AEPB Inhale 1 puff into the lungs 4 (four) times daily as needed. Patient taking differently: Inhale 1 puff into the lungs 4 (four) times daily as needed (asthma).  08/11/14   [name], MD  ALPRAZolam Duanne Moron) 1 MG tablet Take 1 tablet (1 mg total) by mouth 2 (two) times daily as needed for anxiety. 11/03/14   [name], MD  azithromycin (ZITHROMAX) 500 MG tablet Take  1 tablet (500 mg total) by mouth daily. 10/23/14   [name], MD  Blood Glucose Calibration (ELEMENT CONTROL) NORMAL LIQD as directed.      Historical Provider, MD  Blood Glucose Monitoring Suppl ([place]) W/DEVICE KIT      Historical Provider, MD  carvedilol (COREG) 12.5 MG tablet take 1 tablet by mouth twice a day with meals 11/03/14   [name], MD  cyclobenzaprine (FLEXERIL) 10 MG tablet Take 1 tablet (10 mg total) by mouth at bedtime as needed for muscle spasms. Patient not taking: Reported on 10/23/2014 09/11/14   Melony Overly, MD  emtricitabine-tenofovir (TRUVADA) 200-300 MG per tablet Take 1 tablet by mouth daily. 07/09/14   [name], MD  esomeprazole (NEXIUM) 40 MG capsule Take 1 capsule (40 mg total) by mouth daily before breakfast. 10/05/12   [name], MD  glucose blood (ELEMENT TEST) test strip 1 each by Other route as needed. Use as instructed     Historical Provider, MD  HYDROcodone-acetaminophen (NORCO) 10-325 MG per tablet TAKE 1  TABLET BY MOUTH EVERY 4 HOURS AS NEEDED FOR PAIN with limit 4 per day 12/03/14   [name], MD  LOVAZA 1 G capsule take 2 capsules by mouth twice a day 02/04/14   [name], MD  meloxicam (MOBIC) 15 MG tablet Take 1 tablet (15 mg total) by mouth daily as needed for pain. Patient not taking: Reported on 10/23/2014 09/11/14   Melony Overly, MD  NEXIUM 40 MG capsule take 1 capsule by mouth once daily BEFORE BREAKFAST 09/16/14   [name], MD  omeprazole (PRILOSEC) 20 MG capsule Take 1 capsule (20 mg total) by mouth daily. Patient not taking: Reported on 10/23/2014 09/15/14   Hyman Bible, PA-C  PARoxetine (PAXIL) 30 MG tablet TAKE 1 TABLET BY MOUTH EVERY MORNING 12/14/13   [name], MD  Pitavastatin Calcium (LIVALO) 2 MG TABS Take 1 tablet (2 mg total) by mouth daily. 12/01/14   [name], MD  QUEtiapine (SEROQUEL XR) 50 MG TB24 24 hr tablet Take 1 tablet (50 mg total) by mouth at bedtime. 07/08/14   [name], MD  tiotropium (SPIRIVA HANDIHALER) 18 MCG inhalation capsule PLACE 1 CAPSULE INTO THE INHALER AND INHALE DAILY 11/03/14   [name], MD  traZODone (DESYREL) 100 MG tablet take 3 tablets by mouth at bedtime 08/11/14   [name], MD  BP 124/75 mmHg  Pulse 102  Temp(Src) 98.6 F (37 C) (Oral)  Resp 22  SpO2 94% Physical Exam  Constitutional: He is oriented to person, place, and time. He appears well-developed. No distress.  HENT:  Head: Normocephalic and atraumatic.  edentulous  Eyes: Conjunctivae and EOM are normal.  Cardiovascular: Regular rhythm.  Tachycardia present.   Pulmonary/Chest: Effort normal. No stridor. No respiratory distress.  Abdominal: He exhibits no distension.    Genitourinary:     Musculoskeletal: He exhibits no edema.  Neurological: He is alert and oriented to person, place, and time.  Skin: Skin is warm and dry.  Psychiatric: He has a normal mood and affect.  Nursing note and vitals reviewed.   ED Course  Procedures (including critical care time) Labs Review Labs Reviewed  CBC WITH DIFFERENTIAL/PLATELET - Abnormal; Notable for the following:    Hemoglobin 17.5 (*)    MCH 34.4 (*)    All other components within normal limits  COMPREHENSIVE METABOLIC PANEL - Abnormal; Notable for the following:    Glucose, Bld 100 (*)    AST 50 (*)    Total Bilirubin 1.6 (*)    All other components within normal limits  LIPASE, BLOOD  PROTIME-INR  I-STAT CG4 LACTIC ACID, ED  TYPE AND SCREEN    Imaging Review Ct Abdomen Pelvis W Contrast  12/04/2014   CLINICAL DATA:  Bloody stools. No pain. History of common iliac artery stent. Peptic ulcer.  EXAM: CT ABDOMEN AND PELVIS WITH CONTRAST  TECHNIQUE: Multidetector CT imaging of the abdomen and pelvis was performed using the standard protocol following bolus administration of intravenous contrast.  CONTRAST:  22m OMNIPAQUE IOHEXOL 300 MG/ML SOLN, 1013mOMNIPAQUE IOHEXOL 300 MG/ML SOLN  COMPARISON:   02/21/2006 abdominal ultrasound.  No prior C2.  FINDINGS: Lower chest: Clear lung bases. Normal heart size without pericardial or pleural effusion.  Hepatobiliary: Moderate hepatic steatosis, with more focal steatosis adjacent to falciform ligament. Normal gallbladder, without biliary ductal dilatation.  Pancreas: Normal, without mass or ductal dilatation.  Spleen: Normal  Adrenals/Urinary Tract: Normal adrenal glands. Bilateral too small to characterize renal lesions. No hydronephrosis. Normal urinary bladder.  Stomach/Bowel: Partial gastric underdistention. Apparent wall thickening, at 2.2 cm on image 14 involving the gastric body. Normal appendix and terminal ileum. Normal small bowel. Portions of the rectum appear thick-walled, including posteriorly on image 74 and circumferentially at the rectosigmoid junction on image 69.  Vascular/Lymphatic: Advanced for age aortic and branch vessel atherosclerosis. Non aneurysmal dilatation of the infrarenal segment 2.4 cm. A left common iliac artery stent is identified with normal contrast opacification distally. No pelvic adenopathy.  Reproductive: Normal prostate.  Other: No significant free fluid.  Musculoskeletal: Degenerative disc disease at the lumbosacral junction. Mild disc bulges at L4-5 and L5-S1. Left femoral head avascular necrosis.  IMPRESSION: 1. No definite explanation for GI bleeding. Areas of apparent wall thickening within the rectum and rectosigmoid junction are likely due to retained stool and underdistention respectively. No evidence of colitis. 2. Moderate hepatic steatosis. 3. Gastric wall thickening which could be at least partially due to underdistention. Cannot exclude gastritis. 4. Advanced atherosclerosis. 5. Left femoral head avascular necrosis.   Electronically Signed   By: KyAbigail Miyamoto.D.   On: 12/04/2014 19:23    Cardiac: 101 - st, abnormal  O2- 99%ra, nml  9:01 PM Patient in no distress.  He states that he had a bowel movement that  was essentially STOOL, with minimal blood. I discussed all findings with  him and his companion. He will follow-up with his gastroenterologist tomorrow via telephone.  MDM  She presents with ongoing rectal bleeding. The patient is awake and alert, hemodynamically stable. Patient has gross blood per rectum, but is hemodynamically consistently stable for hours of monitoring here. Hemoglobin stable, belly is soft, CT unremarkable. Patient's bleeding was resolving, with defecation prior to discharge. Patient has a gastroenterologist for follow-up tomorrow.   Carmin Muskrat, MD 12/04/14 2108

## 2014-12-04 NOTE — ED Notes (Signed)
Pt presents from home via EMS, c/o rectal pain/discomfort rating 2/10, states he has noticed in the last 3 days he has been having bloody stools which worsened today when he had a significant amount of blood without with a bowel movement. Denies shortness of breath from his COPD baseline, reports being lightheaded and dizzy, denies chest pain.

## 2014-12-04 NOTE — ED Notes (Signed)
Bed: OM60 Expected date:  Expected time:  Means of arrival:  Comments: EMS- rectal bleed

## 2014-12-05 LAB — ABO/RH: ABO/RH(D): O POS

## 2014-12-22 DIAGNOSIS — J449 Chronic obstructive pulmonary disease, unspecified: Secondary | ICD-10-CM | POA: Diagnosis not present

## 2014-12-31 ENCOUNTER — Other Ambulatory Visit: Payer: Self-pay | Admitting: Internal Medicine

## 2014-12-31 DIAGNOSIS — M545 Low back pain, unspecified: Secondary | ICD-10-CM

## 2014-12-31 DIAGNOSIS — M170 Bilateral primary osteoarthritis of knee: Secondary | ICD-10-CM

## 2014-12-31 MED ORDER — HYDROCODONE-ACETAMINOPHEN 10-325 MG PO TABS: ORAL_TABLET | ORAL | Status: DC

## 2015-01-01 ENCOUNTER — Telehealth: Payer: Self-pay | Admitting: Internal Medicine

## 2015-01-01 NOTE — Telephone Encounter (Signed)
Patient is requesting main pharmacy to be noted as [facility] on [address].

## 2015-01-01 NOTE — Telephone Encounter (Signed)
Pharmacy  Information updated in Epic.

## 2015-01-06 ENCOUNTER — Ambulatory Visit: Payer: Self-pay | Admitting: Internal Medicine

## 2015-01-10 IMAGING — CR DG HIP COMPLETE 2+V*R*
3 series · 3 of 3 positions shown · non-contrast
Comparison: 01/09/2013

CLINICAL DATA: Right hip pain

EXAM:
RIGHT HIP - COMPLETE 2+ VIEW

[view not recorded (1 of 3)]
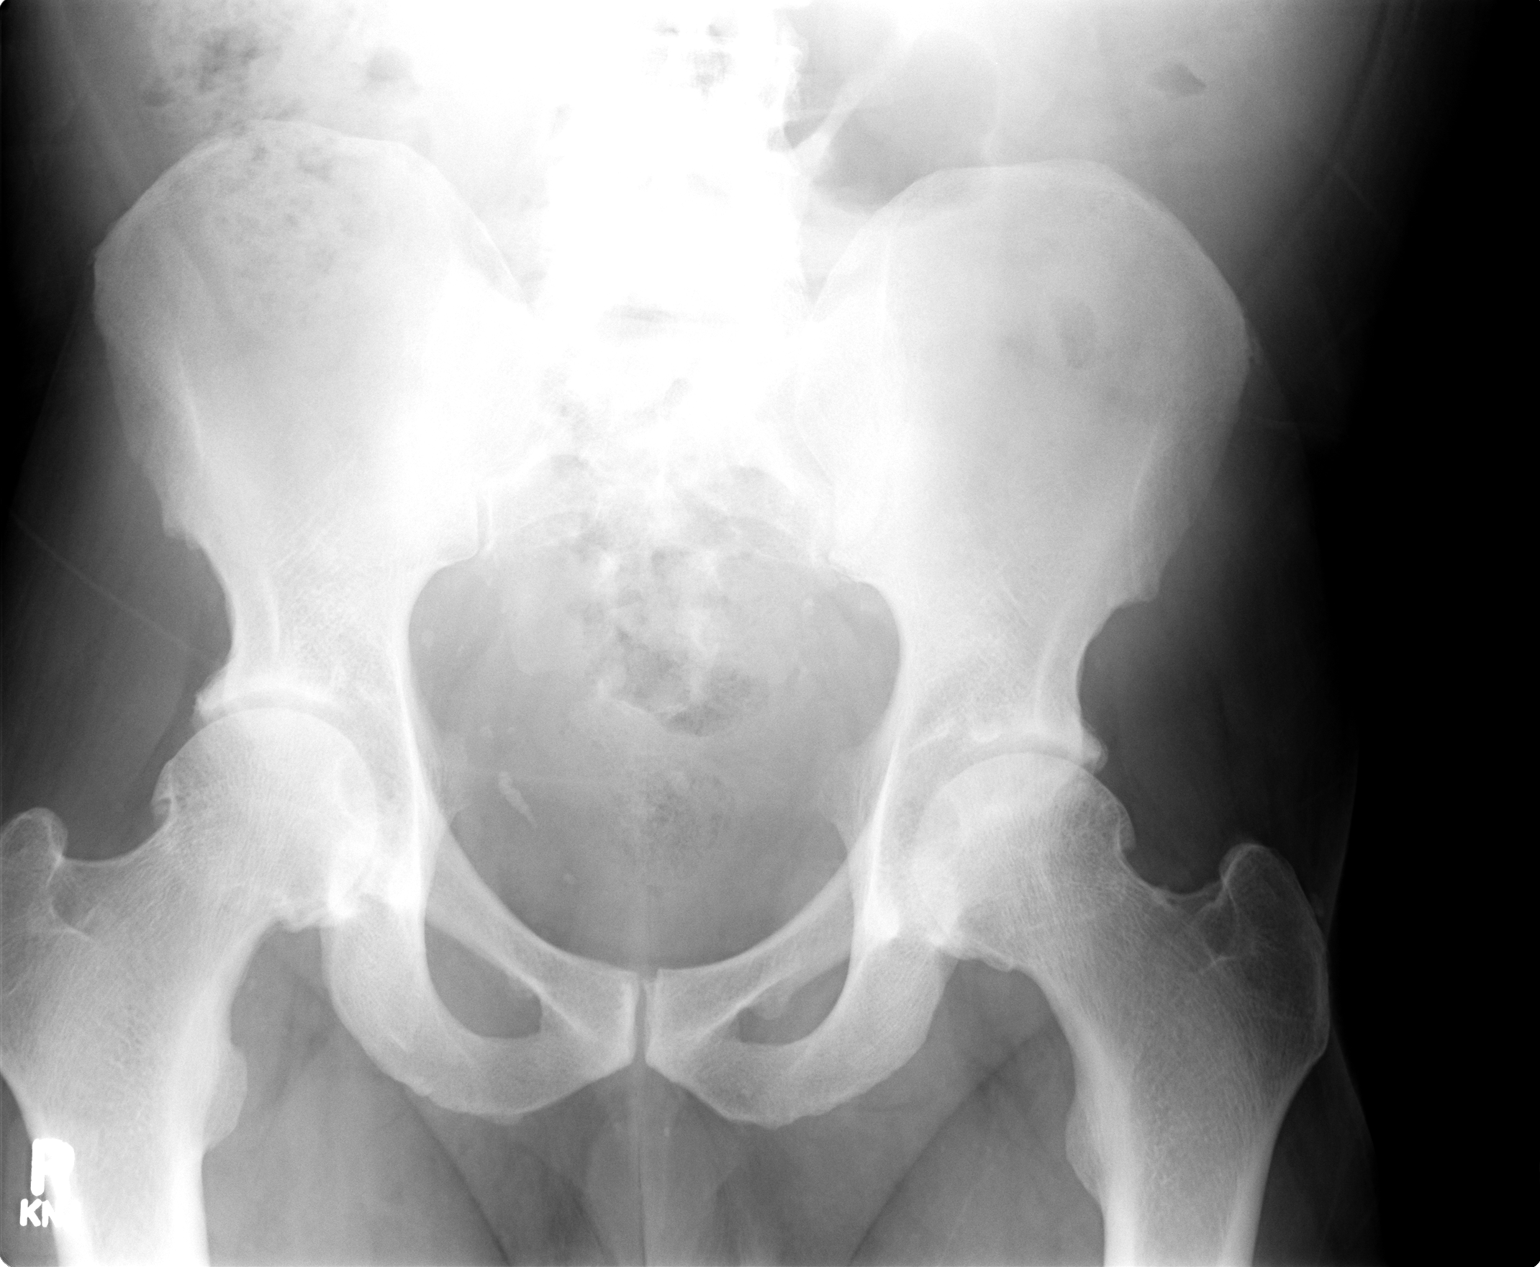

[view not recorded (2 of 3)]
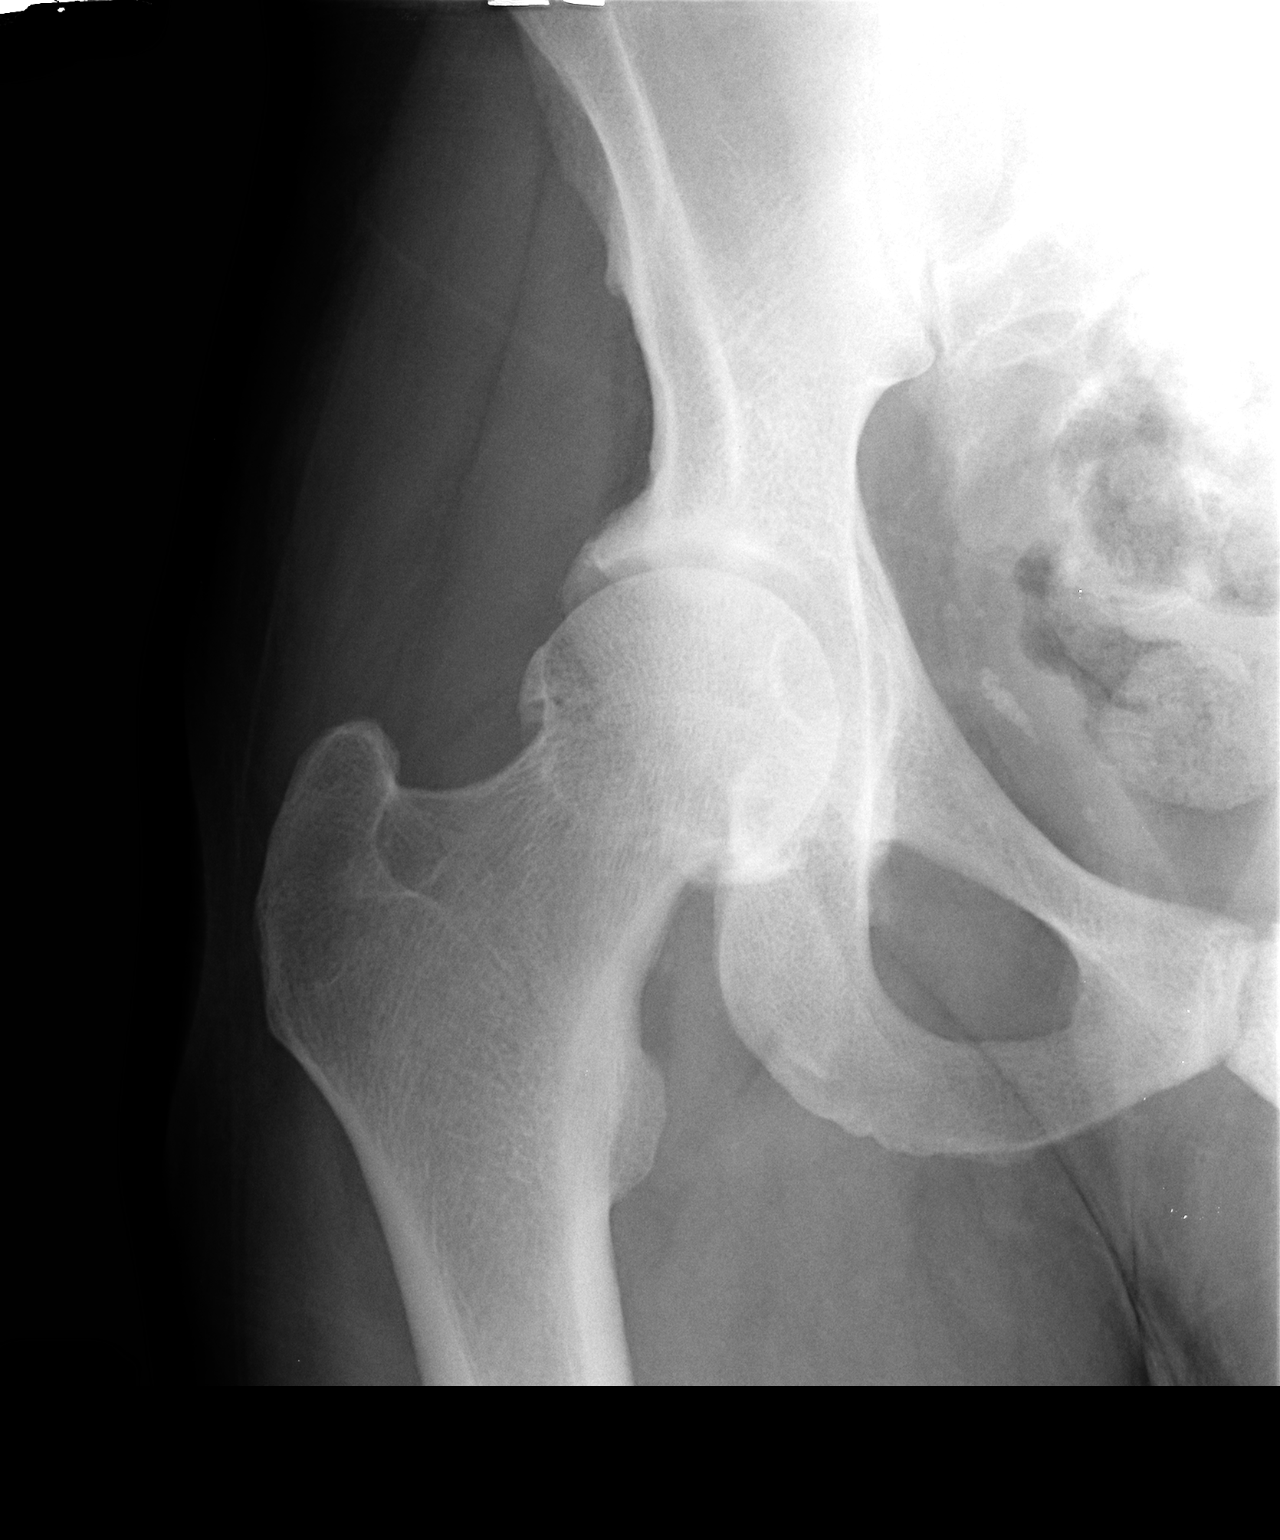

[view not recorded (3 of 3)]
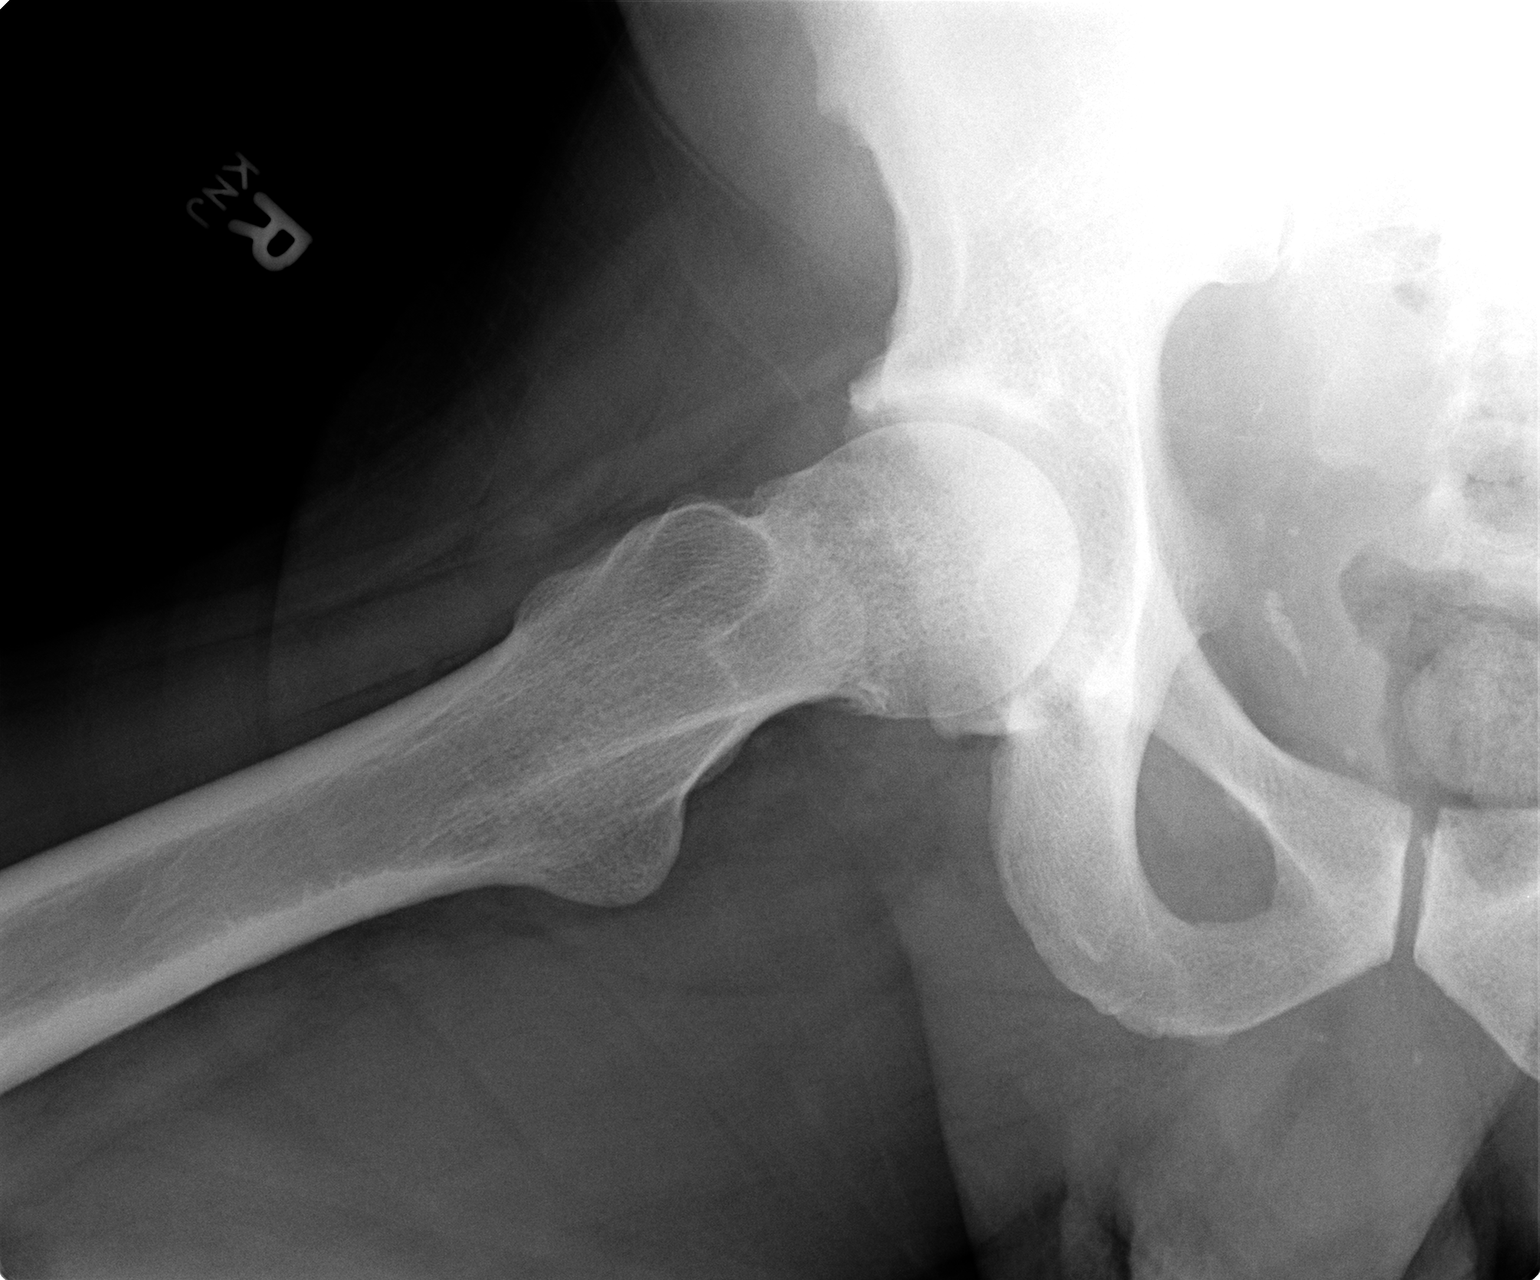

[3 of 3 positions shown; findings below may reference images not displayed]

FINDINGS: Three views of the right hip submitted. No acute fracture or
subluxation. Again noted spurring of femoral head. Mild spurring of
superior acetabulum. Pelvic phleboliths.
IMPRESSION: No acute fracture or subluxation. Mild osteoarthritic changes again
noted.

## 2015-01-10 IMAGING — CR DG LUMBAR SPINE COMPLETE 4+V
5 series · 5 of 5 positions shown · non-contrast
Comparison: 01/09/2013

CLINICAL DATA: Low back pain

EXAM:
LUMBAR SPINE - COMPLETE 4+ VIEW

[view not recorded (1 of 5)]
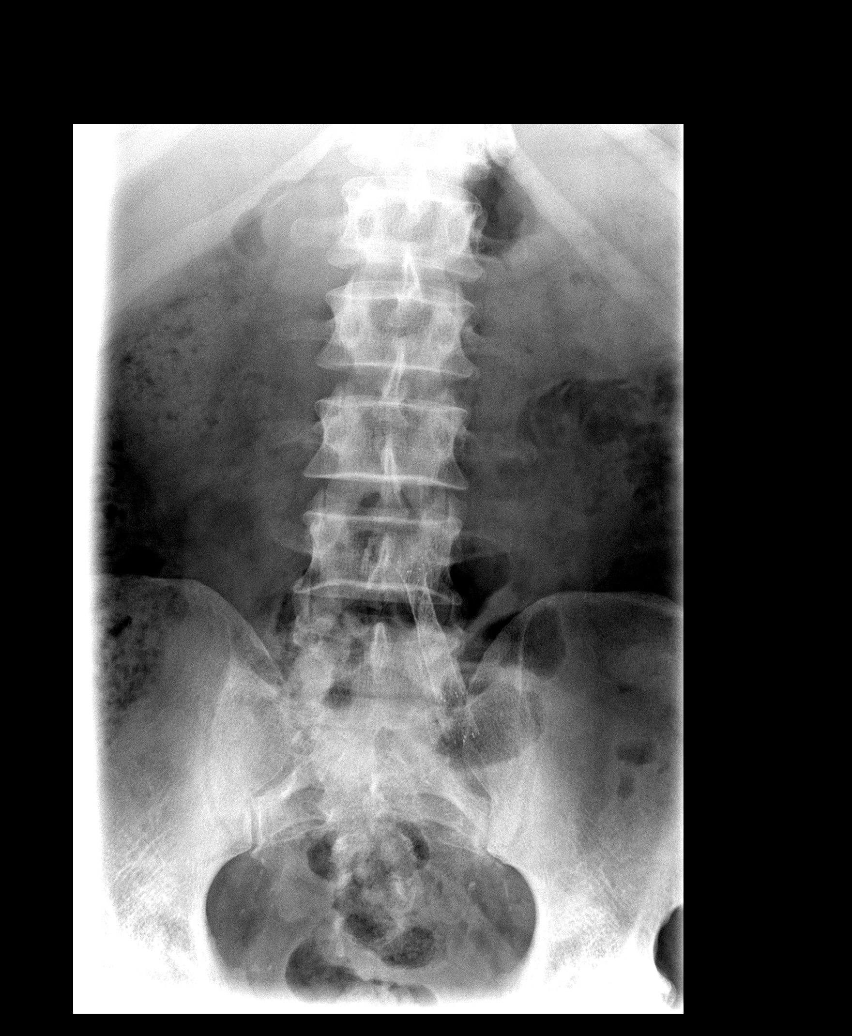

[view not recorded (2 of 5)]
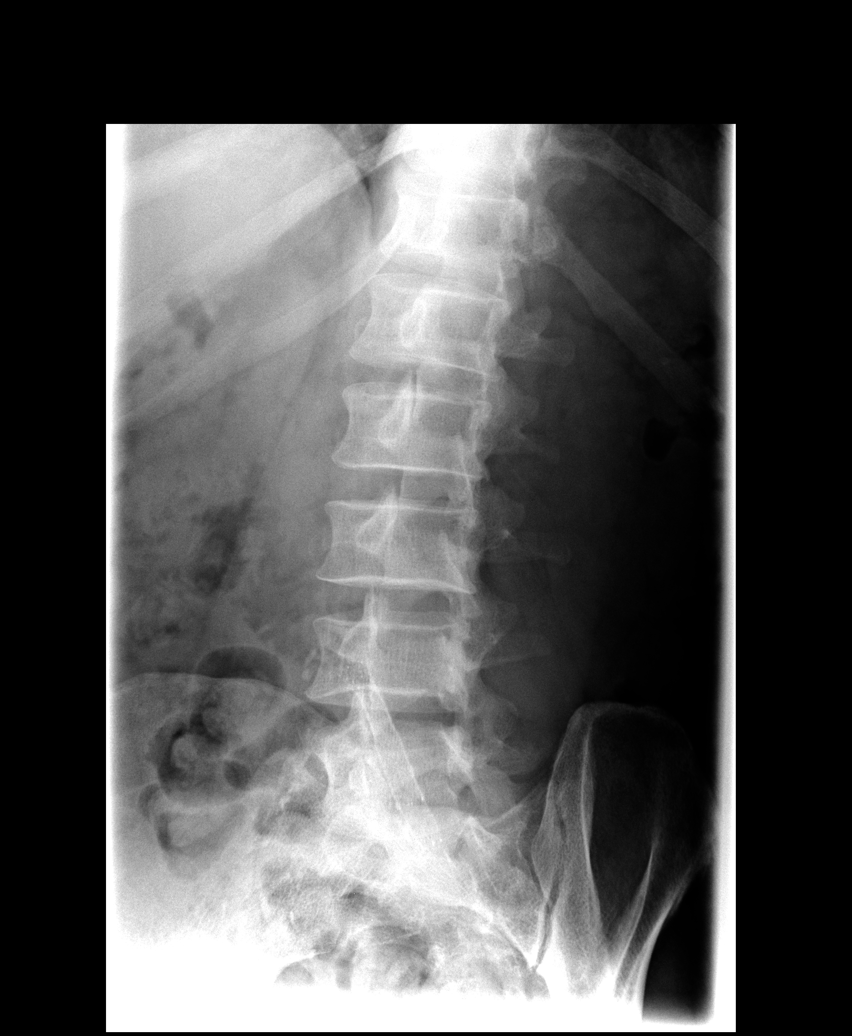

[view not recorded (3 of 5)]
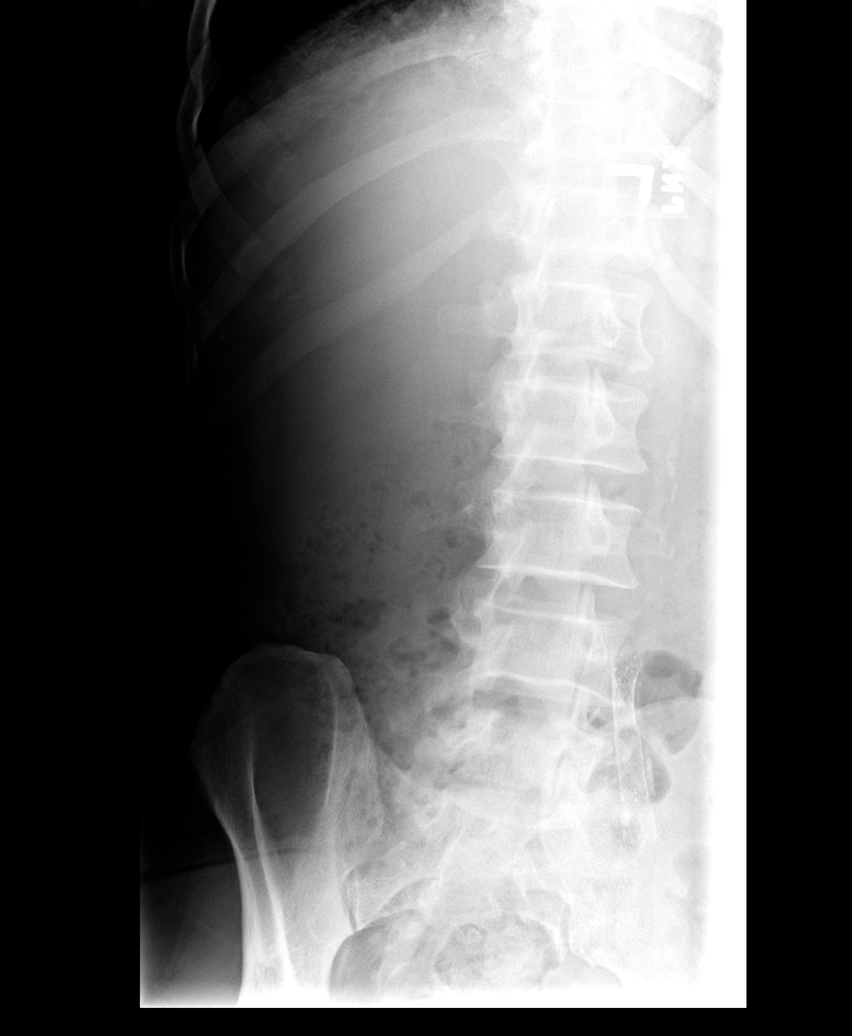

[view not recorded (4 of 5)]
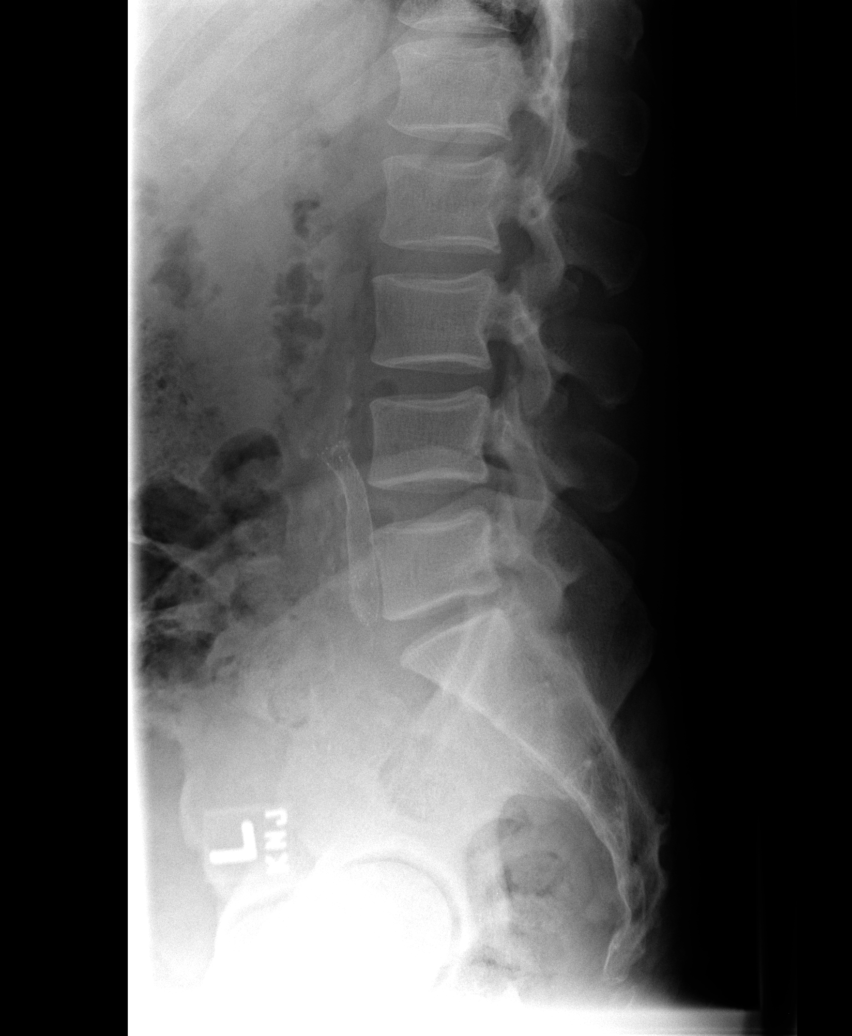

[view not recorded (5 of 5)]
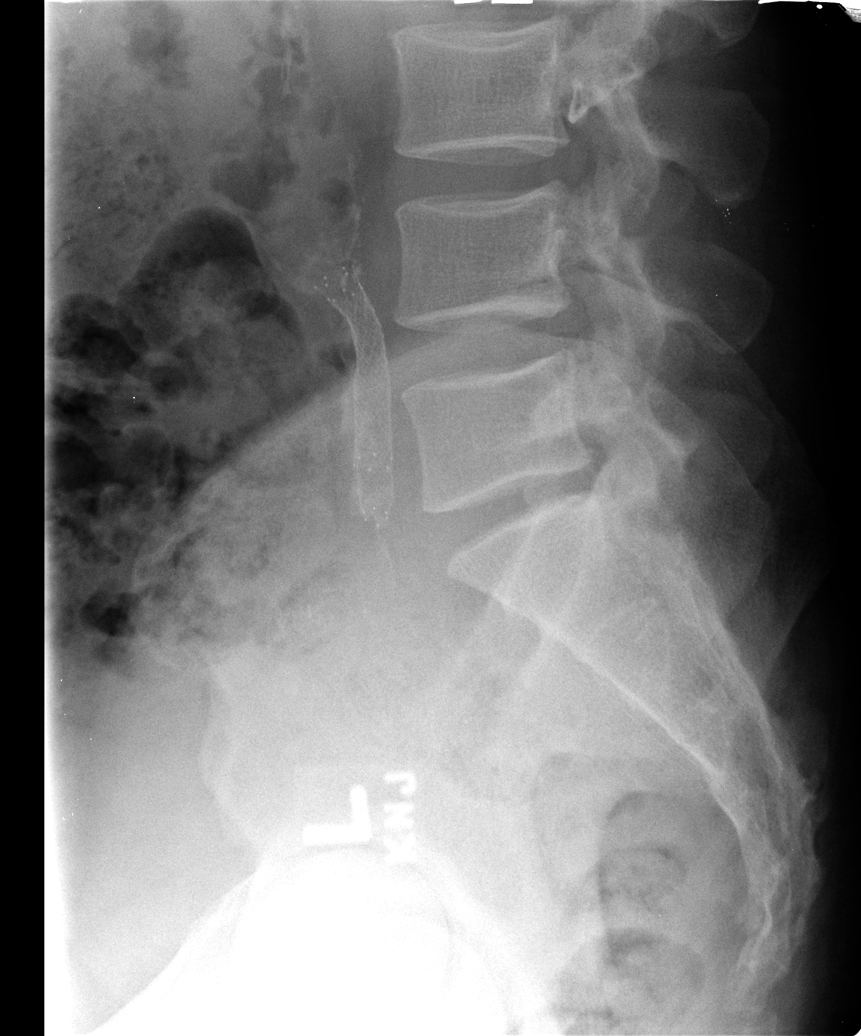

[5 of 5 positions shown; findings below may reference images not displayed]

FINDINGS: There is no evidence of lumbar spine fracture. Alignment is normal.
Intervertebral disc spaces are maintained. Overlapping left common
iliac artery stents. Patchy aortic calcifications.
IMPRESSION: Negative.

## 2015-01-13 ENCOUNTER — Ambulatory Visit: Payer: Medicare Other | Admitting: Internal Medicine

## 2015-01-22 ENCOUNTER — Encounter: Payer: Self-pay | Admitting: Internal Medicine

## 2015-01-22 ENCOUNTER — Other Ambulatory Visit (INDEPENDENT_AMBULATORY_CARE_PROVIDER_SITE_OTHER): Payer: Medicare Other

## 2015-01-22 ENCOUNTER — Ambulatory Visit (INDEPENDENT_AMBULATORY_CARE_PROVIDER_SITE_OTHER): Payer: Medicare Other | Admitting: Internal Medicine

## 2015-01-22 VITALS — BP 120/80 | HR 74 | Temp 98.5°F | Resp 16 | Ht 75.0 in | Wt 188.0 lb

## 2015-01-22 DIAGNOSIS — R739 Hyperglycemia, unspecified: Secondary | ICD-10-CM

## 2015-01-22 DIAGNOSIS — I10 Essential (primary) hypertension: Secondary | ICD-10-CM | POA: Diagnosis not present

## 2015-01-22 DIAGNOSIS — I429 Cardiomyopathy, unspecified: Secondary | ICD-10-CM

## 2015-01-22 DIAGNOSIS — J441 Chronic obstructive pulmonary disease with (acute) exacerbation: Secondary | ICD-10-CM

## 2015-01-22 DIAGNOSIS — M170 Bilateral primary osteoarthritis of knee: Secondary | ICD-10-CM

## 2015-01-22 LAB — COMPREHENSIVE METABOLIC PANEL
ALT: 39 U/L (ref 0–53)
AST: 30 U/L (ref 0–37)
Albumin: 4.1 g/dL (ref 3.5–5.2)
Alkaline Phosphatase: 57 U/L (ref 39–117)
BUN: 8 mg/dL (ref 6–23)
CHLORIDE: 107 meq/L (ref 96–112)
CO2: 29 mEq/L (ref 19–32)
Calcium: 9.3 mg/dL (ref 8.4–10.5)
Creatinine, Ser: 0.85 mg/dL (ref 0.40–1.50)
GFR: 120.36 mL/min (ref >60.00)
GLUCOSE: 110 mg/dL — AB (ref 70–99)
POTASSIUM: 4.4 meq/L (ref 3.5–5.1)
Sodium: 141 mEq/L (ref 135–145)
TOTAL PROTEIN: 6.7 g/dL (ref 6.0–8.3)
Total Bilirubin: 0.4 mg/dL (ref 0.2–1.2)

## 2015-01-22 LAB — HEMOGLOBIN A1C: HEMOGLOBIN A1C: 5.6 % (ref 4.6–6.5)

## 2015-01-22 MED ORDER — FLUTICASONE FUROATE-VILANTEROL 100-25 MCG/INH IN AEPB: 1.0000 | INHALATION_SPRAY | Freq: Every day | RESPIRATORY_TRACT | Status: DC

## 2015-01-22 NOTE — Patient Instructions (Signed)

## 2015-01-22 NOTE — Progress Notes (Signed)
Pre visit review using our clinic review tool, if applicable. No additional management support is needed unless otherwise documented below in the visit note. 

## 2015-01-25 ENCOUNTER — Encounter: Payer: Self-pay | Admitting: Internal Medicine

## 2015-01-25 NOTE — Progress Notes (Signed)
Subjective:    Patient ID: [name], male    DOB: [birth year], 54 y.o.   MRN: [id number]  Cough This is a recurrent problem. The current episode started more than 1 year ago. The problem has been gradually worsening. The problem occurs every few hours. The cough is non-productive. Associated symptoms include shortness of breath and wheezing. Pertinent negatives include no chest pain, chills, ear congestion, ear pain, fever, heartburn, hemoptysis, myalgias, nasal congestion, postnasal drip, rash, rhinorrhea, sore throat, sweats or weight loss. The symptoms are aggravated by pollens. Risk factors for lung disease include smoking/tobacco exposure. He has tried a beta-agonist inhaler and ipratropium inhaler for the symptoms. The treatment provided moderate relief. His past medical history is significant for COPD and emphysema. There is no history of asthma, bronchiectasis, bronchitis, environmental allergies or pneumonia.      Review of Systems  Constitutional: Negative.  Negative for fever, chills, weight loss, diaphoresis, appetite change and fatigue.  HENT: Negative.  Negative for dental problem, ear discharge, ear pain, postnasal drip, rhinorrhea and sore throat.   Eyes: Negative.   Respiratory: Positive for cough, shortness of breath and wheezing. Negative for hemoptysis.   Cardiovascular: Negative.  Negative for chest pain, palpitations and leg swelling.  Gastrointestinal: Negative.  Negative for heartburn, nausea, vomiting, abdominal pain, diarrhea, constipation and blood in stool.  Endocrine: Negative.   Genitourinary: Negative.   Musculoskeletal: Negative.  Negative for myalgias.  Skin: Negative.  Negative for rash.  Allergic/Immunologic: Negative.  Negative for environmental allergies.  Neurological: Negative.   Hematological: Negative.  Negative for adenopathy. Does not bruise/bleed easily.  Psychiatric/Behavioral: Negative.        Objective:   Physical Exam    Constitutional: He is oriented to person, place, and time. He appears well-developed and well-nourished.  Non-toxic appearance. He does not have a sickly appearance. He does not appear ill. No distress.  HENT:  Head: Normocephalic and atraumatic.  Mouth/Throat: Oropharynx is clear and moist. No oropharyngeal exudate.  Eyes: Conjunctivae are normal. Right eye exhibits no discharge. Left eye exhibits no discharge. No scleral icterus.  Neck: Normal range of motion. Neck supple. No JVD present. No tracheal deviation present. No thyromegaly present.  Cardiovascular: Normal rate, regular rhythm, normal heart sounds and intact distal pulses.  Exam reveals no gallop and no friction rub.   No murmur heard. Pulmonary/Chest: Effort normal. No accessory muscle usage or stridor. No respiratory distress. He has no decreased breath sounds. He has no wheezes. He has rhonchi in the right middle field and the left middle field. He has no rales. He exhibits no tenderness.  Abdominal: Soft. Bowel sounds are normal. He exhibits no distension and no mass. There is no tenderness. There is no rebound and no guarding.  Musculoskeletal: Normal range of motion. He exhibits no edema or tenderness.  Lymphadenopathy:    He has no cervical adenopathy.  Neurological: He is oriented to person, place, and time.  Skin: Skin is warm and dry. No rash noted. He is not diaphoretic. No erythema. No pallor.    Lab Results  Component Value Date   WBC 8.8 12/04/2014   HGB 17.5* 12/04/2014   HCT 48.8 12/04/2014   PLT 152 12/04/2014   GLUCOSE 110* 01/22/2015   CHOL 208* 07/08/2014   TRIG 88.0 07/08/2014   HDL 47.60 07/08/2014   LDLDIRECT 71.2 09/29/2011   LDLCALC 143* 07/08/2014   ALT 39 01/22/2015   AST 30 01/22/2015   NA 141 01/22/2015   K  4.4 01/22/2015   CL 107 01/22/2015   CREATININE 0.85 01/22/2015   BUN 8 01/22/2015   CO2 29 01/22/2015   TSH 1.18 07/08/2014   PSA 0.69 12/05/2013   INR 0.94 12/04/2014   HGBA1C  5.6 01/22/2015   MICROALBUR 0.2 10/08/2008        Assessment & Plan:

## 2015-01-25 NOTE — Assessment & Plan Note (Signed)
His BP is well controlled Lytes and renal function are stable 

## 2015-01-25 NOTE — Assessment & Plan Note (Signed)
He is due for a f/up set of PFT's He has persistent s/s so I have asked him to add an ICS/LABA combination to spiriva

## 2015-01-25 NOTE — Assessment & Plan Note (Signed)
Will cont norco as needed for pain 

## 2015-01-25 NOTE — Assessment & Plan Note (Signed)
He appears to be stable WRT this but has not been seen by cardiology in several years and his prior cardiologist (Dr. Leonia Reeves) is not in [place] anymore so I have asked him to be seen by a new cardiologist

## 2015-01-27 ENCOUNTER — Other Ambulatory Visit: Payer: Self-pay | Admitting: Internal Medicine

## 2015-01-27 DIAGNOSIS — M545 Low back pain, unspecified: Secondary | ICD-10-CM

## 2015-01-27 DIAGNOSIS — M170 Bilateral primary osteoarthritis of knee: Secondary | ICD-10-CM

## 2015-01-27 MED ORDER — HYDROCODONE-ACETAMINOPHEN 10-325 MG PO TABS: ORAL_TABLET | ORAL | Status: DC

## 2015-02-17 ENCOUNTER — Telehealth: Payer: Self-pay

## 2015-02-17 ENCOUNTER — Encounter: Payer: Self-pay | Admitting: Internal Medicine

## 2015-02-17 DIAGNOSIS — M170 Bilateral primary osteoarthritis of knee: Secondary | ICD-10-CM

## 2015-02-17 DIAGNOSIS — M545 Low back pain, unspecified: Secondary | ICD-10-CM

## 2015-02-17 MED ORDER — HYDROCODONE-ACETAMINOPHEN 10-325 MG PO TABS: ORAL_TABLET | ORAL | Status: DC

## 2015-02-17 NOTE — Telephone Encounter (Signed)
[facility] called stating that on 01/30/15 pt Rx was filled incorrectly by pharmacist. Patient noticed that medication was not working well and called into pharmacy who then noticed that they filled for 5-325 by mistake instead of 10/325. They will fill for correct dose but patient must present a new script. Thanks

## 2015-02-17 NOTE — Telephone Encounter (Signed)
done

## 2015-02-18 ENCOUNTER — Encounter: Payer: Self-pay | Admitting: Internal Medicine

## 2015-02-18 NOTE — Telephone Encounter (Signed)
Rx placed upfront

## 2015-02-25 ENCOUNTER — Telehealth: Payer: Self-pay

## 2015-02-25 DIAGNOSIS — F418 Other specified anxiety disorders: Secondary | ICD-10-CM

## 2015-02-25 MED ORDER — ALPRAZOLAM 1 MG PO TABS: 1.0000 mg | ORAL_TABLET | Freq: Two times a day (BID) | ORAL | Status: DC | PRN

## 2015-02-25 NOTE — Telephone Encounter (Signed)
done

## 2015-02-25 NOTE — Telephone Encounter (Signed)
Received refill request from [name]  request refills for alprazolam 1 mg . Rx last written 11/03/17 and pt last seen 01/22/15  . Please advise Thanks

## 2015-03-17 ENCOUNTER — Other Ambulatory Visit: Payer: Self-pay | Admitting: Internal Medicine

## 2015-03-17 ENCOUNTER — Encounter: Payer: Self-pay | Admitting: Internal Medicine

## 2015-03-17 DIAGNOSIS — M545 Low back pain, unspecified: Secondary | ICD-10-CM

## 2015-03-17 DIAGNOSIS — M170 Bilateral primary osteoarthritis of knee: Secondary | ICD-10-CM

## 2015-03-17 MED ORDER — HYDROCODONE-ACETAMINOPHEN 10-325 MG PO TABS: ORAL_TABLET | ORAL | Status: DC

## 2015-03-23 ENCOUNTER — Institutional Professional Consult (permissible substitution): Payer: Self-pay | Admitting: Pulmonary Disease

## 2015-03-31 ENCOUNTER — Encounter (HOSPITAL_COMMUNITY): Payer: Self-pay | Admitting: *Deleted

## 2015-03-31 ENCOUNTER — Telehealth: Payer: Self-pay | Admitting: Internal Medicine

## 2015-03-31 ENCOUNTER — Emergency Department (HOSPITAL_COMMUNITY): Payer: Medicare Other

## 2015-03-31 ENCOUNTER — Other Ambulatory Visit: Payer: Self-pay

## 2015-03-31 ENCOUNTER — Emergency Department (HOSPITAL_COMMUNITY)
Admission: EM | Admit: 2015-03-31 | Discharge: 2015-03-31 | Disposition: A | Discharge status: Home / Self Care | Payer: Medicare Other | Attending: Emergency Medicine | Admitting: Emergency Medicine

## 2015-03-31 ENCOUNTER — Other Ambulatory Visit: Payer: Self-pay | Admitting: Internal Medicine

## 2015-03-31 DIAGNOSIS — Z862 Personal history of diseases of the blood and blood-forming organs and certain disorders involving the immune mechanism: Secondary | ICD-10-CM | POA: Diagnosis not present

## 2015-03-31 DIAGNOSIS — Z8711 Personal history of peptic ulcer disease: Secondary | ICD-10-CM | POA: Diagnosis not present

## 2015-03-31 DIAGNOSIS — K219 Gastro-esophageal reflux disease without esophagitis: Secondary | ICD-10-CM | POA: Diagnosis not present

## 2015-03-31 DIAGNOSIS — Z8619 Personal history of other infectious and parasitic diseases: Secondary | ICD-10-CM | POA: Insufficient documentation

## 2015-03-31 DIAGNOSIS — R05 Cough: Secondary | ICD-10-CM | POA: Diagnosis not present

## 2015-03-31 DIAGNOSIS — Z86010 Personal history of colonic polyps: Secondary | ICD-10-CM | POA: Diagnosis not present

## 2015-03-31 DIAGNOSIS — Z7951 Long term (current) use of inhaled steroids: Secondary | ICD-10-CM | POA: Diagnosis not present

## 2015-03-31 DIAGNOSIS — Z79899 Other long term (current) drug therapy: Secondary | ICD-10-CM | POA: Diagnosis not present

## 2015-03-31 DIAGNOSIS — R059 Cough, unspecified: Secondary | ICD-10-CM

## 2015-03-31 DIAGNOSIS — R0602 Shortness of breath: Secondary | ICD-10-CM | POA: Diagnosis not present

## 2015-03-31 DIAGNOSIS — Z7251 High risk heterosexual behavior: Secondary | ICD-10-CM

## 2015-03-31 DIAGNOSIS — M170 Bilateral primary osteoarthritis of knee: Secondary | ICD-10-CM

## 2015-03-31 DIAGNOSIS — Z720 Tobacco use: Secondary | ICD-10-CM | POA: Diagnosis not present

## 2015-03-31 DIAGNOSIS — J45901 Unspecified asthma with (acute) exacerbation: Secondary | ICD-10-CM | POA: Diagnosis not present

## 2015-03-31 DIAGNOSIS — E119 Type 2 diabetes mellitus without complications: Secondary | ICD-10-CM | POA: Diagnosis not present

## 2015-03-31 DIAGNOSIS — R062 Wheezing: Secondary | ICD-10-CM | POA: Diagnosis not present

## 2015-03-31 DIAGNOSIS — I10 Essential (primary) hypertension: Secondary | ICD-10-CM | POA: Insufficient documentation

## 2015-03-31 DIAGNOSIS — F209 Schizophrenia, unspecified: Secondary | ICD-10-CM | POA: Insufficient documentation

## 2015-03-31 DIAGNOSIS — J449 Chronic obstructive pulmonary disease, unspecified: Secondary | ICD-10-CM | POA: Diagnosis not present

## 2015-03-31 DIAGNOSIS — M545 Low back pain, unspecified: Secondary | ICD-10-CM

## 2015-03-31 DIAGNOSIS — F17210 Nicotine dependence, cigarettes, uncomplicated: Secondary | ICD-10-CM | POA: Diagnosis not present

## 2015-03-31 LAB — CBC
HCT: 44.4 % (ref 39.0–52.0)
HEMOGLOBIN: 15.4 g/dL (ref 13.0–17.0)
MCH: 31.4 pg (ref 26.0–34.0)
MCHC: 34.7 g/dL (ref 30.0–36.0)
MCV: 90.4 fL (ref 78.0–100.0)
Platelets: 93 10*3/uL — ABNORMAL LOW (ref 150–400)
RBC: 4.91 MIL/uL (ref 4.22–5.81)
RDW: 14.8 % (ref 11.5–15.5)
WBC: 4.1 10*3/uL (ref 4.0–10.5)

## 2015-03-31 LAB — BASIC METABOLIC PANEL
ANION GAP: 9 (ref 5–15)
BUN: 6 mg/dL (ref 6–20)
CO2: 23 mmol/L (ref 22–32)
Calcium: 9 mg/dL (ref 8.9–10.3)
Chloride: 106 mmol/L (ref 101–111)
Creatinine, Ser: 0.7 mg/dL (ref 0.61–1.24)
GFR calc Af Amer: >60 mL/min (ref >60)
GLUCOSE: 98 mg/dL (ref 65–99)
POTASSIUM: 4.3 mmol/L (ref 3.5–5.1)
SODIUM: 138 mmol/L (ref 135–145)

## 2015-03-31 LAB — BRAIN NATRIURETIC PEPTIDE: B Natriuretic Peptide: 17 pg/mL (ref 0.0–100.0)

## 2015-03-31 LAB — I-STAT TROPONIN, ED: Troponin i, poc: 0.02 ng/mL (ref 0.00–0.08)

## 2015-03-31 MED ORDER — EMTRICITABINE-TENOFOVIR DF 200-300 MG PO TABS: 1.0000 | ORAL_TABLET | Freq: Every day | ORAL | Status: DC

## 2015-03-31 MED ORDER — ALBUTEROL SULFATE HFA 108 (90 BASE) MCG/ACT IN AERS: 1.0000 | INHALATION_SPRAY | Freq: Four times a day (QID) | RESPIRATORY_TRACT | Status: DC | PRN

## 2015-03-31 MED ORDER — IPRATROPIUM-ALBUTEROL 0.5-2.5 (3) MG/3ML IN SOLN
3.0000 mL | Freq: Once | RESPIRATORY_TRACT | Status: AC
  Administered 2015-03-31: 3 mL via RESPIRATORY_TRACT
  Filled 2015-03-31: qty 3

## 2015-03-31 NOTE — Telephone Encounter (Signed)
Patient need a refill of his [place]. Send to  [facility] - [place], [address] [place] AT [address] [phone] (Phone) [phone] (Fax)

## 2015-03-31 NOTE — ED Provider Notes (Signed)
CSN: [id number]     Arrival date & time 03/31/15  1015 History   First MD Initiated Contact with Patient 03/31/15 1038     Chief Complaint  Patient presents with  . Cough  . Shortness of Breath     (Consider location/radiation/quality/duration/timing/severity/associated sxs/prior Treatment) HPI Comments: Pt comes in with c/o cough and sob. He states that it started a couple of weeks ago when they switched his albuterol inhaler to a powder inhaler. He denies fever or cp. States that he has been using his inhalers as prescribed. No nausea, vomiting or diaphoresis. No swelling to extremities  The history is provided by the patient. No language interpreter was used.    Past Medical History  Diagnosis Date  . PVD (peripheral vascular disease)   . Cough   . Rhinitis   . Chronic airway obstruction, not elsewhere classified   . Family history of colonic polyps   . Family history of malignant neoplasm of gastrointestinal tract   . Personal history of colonic polyps   . Dysphagia, unspecified(787.20)   . Thrombocytopenia, unspecified   . 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 Tobacco use disorder   . Lumbago   . Type II or unspecified type diabetes mellitus with unspecified complication, not stated as uncontrolled   . Hypertension     MIXED  . Hypertrophy of prostate with urinary obstruction and other lower urinary tract symptoms (LUTS)   . Asthma   . GERD (gastroesophageal reflux disease)   . Schizophrenia   . PUD (peptic ulcer disease)   . Colon polyp    Past Surgical History  Procedure Laterality Date  . Tracheostomy    . Carpal tunnel release Right     WRIST  . Iliac artery stent Left 07/2009    STENT COMMON ILIAC ARTERY. (DR. Gwenlyn Found)  . Podiatric Left 2011    FOOT SURGERY  . Coronary stent placement    . Colonoscopy  approx 2-3 years ago   Family History  Problem Relation Age of Onset  . Colon cancer Mother    . Stroke Mother   . Colon cancer Mother   . Dementia Mother   . Diabetes      3/6 siblings  . Alcohol abuse    . Arthritis    . Hypertension    . Hyperlipidemia    . Heart disease Father   . Heart attack Father   . Alcohol abuse Father   . Colon polyps Sister   . Alcohol abuse Sister   . Anxiety disorder Sister   . Depression Sister   . Drug abuse Brother   . Alcohol abuse Brother   . Alcohol abuse Sister   . Alcohol abuse Sister   . Depression Sister   . Anxiety disorder Sister   . Drug abuse Brother   . Alcohol abuse Brother    History  Substance Use Topics  . Smoking status: Current Every Day Smoker -- 1.50 packs/day for 38 years    Types: Cigarettes  . Smokeless tobacco: Never Used  . Alcohol Use: No    Review of Systems  All other systems reviewed and are negative.     Allergies  Crestor; Ace inhibitors; and Clopidogrel bisulfate  Home Medications   Prior to Admission medications   Medication Sig Start Date End Date Taking? Authorizing Provider  Albuterol Sulfate (PROAIR RESPICLICK) 063 (90 BASE) MCG/ACT AEPB Inhale 1 puff into the lungs 4 (  four) times daily as needed. Patient taking differently: Inhale 1 puff into the lungs 4 (four) times daily as needed (asthma).  08/11/14   [name], MD  ALPRAZolam Duanne Moron) 1 MG tablet Take 1 tablet (1 mg total) by mouth 2 (two) times daily as needed for anxiety. 02/25/15   [name], MD  Blood Glucose Calibration (ELEMENT CONTROL) NORMAL LIQD as directed.      Historical Provider, MD  carvedilol (COREG) 12.5 MG tablet take 1 tablet by mouth twice a day with meals 11/03/14   [name], MD  emtricitabine-tenofovir (TRUVADA) 200-300 MG per tablet Take 1 tablet by mouth daily. 07/09/14   [name], MD  esomeprazole (NEXIUM) 40 MG capsule Take 1 capsule (40 mg total) by mouth daily before breakfast. 10/05/12   [name], MD  Fluticasone Furoate-Vilanterol (BREO ELLIPTA) 100-25 MCG/INH AEPB Inhale 1 puff  into the lungs daily. 01/22/15   [name], MD  HYDROcodone-acetaminophen ([place]) 10-325 MG per tablet TAKE 1  TABLET BY MOUTH EVERY 4 HOURS AS NEEDED FOR PAIN with limit 4 per day 03/17/15   [name], MD  LOVAZA 1 G capsule take 2 capsules by mouth twice a day 02/04/14   [name], MD  omeprazole (PRILOSEC) 20 MG capsule Take 1 capsule (20 mg total) by mouth daily. 09/15/14   Heather Laisure, PA-C  PARoxetine (PAXIL) 30 MG tablet TAKE 1 TABLET BY MOUTH EVERY MORNING 12/14/13   [name], MD  Pitavastatin Calcium (LIVALO) 2 MG TABS Take 1 tablet (2 mg total) by mouth daily. 12/01/14   [name], MD  QUEtiapine (SEROQUEL XR) 50 MG TB24 24 hr tablet Take 1 tablet (50 mg total) by mouth at bedtime. 07/08/14   [name], MD  tiotropium (SPIRIVA HANDIHALER) 18 MCG inhalation capsule PLACE 1 CAPSULE INTO THE INHALER AND INHALE DAILY 11/03/14   [name], MD  traZODone (DESYREL) 100 MG tablet take 3 tablets by mouth at bedtime 08/11/14   [name], MD   BP 131/77 mmHg  Pulse 100  Temp(Src) 98.1 F (36.7 C) (Oral)  Resp 20  Ht '6\' 3"'$  (1.905 m)  Wt 178 lb (80.74 kg)  BMI 22.25 kg/m2  SpO2 98% Physical Exam  Constitutional: He is oriented to person, place, and time. He appears well-developed and well-nourished.  HENT:  Head: Normocephalic and atraumatic.  Cardiovascular: Normal rate and regular rhythm.   Pulmonary/Chest: Effort normal and breath sounds normal.  Upper airway wheezing  Abdominal: Soft. Bowel sounds are normal.  Musculoskeletal: Normal range of motion.  Neurological: He is alert and oriented to person, place, and time.  Skin: Skin is warm and dry.  Psychiatric: He has a normal mood and affect.  Nursing note and vitals reviewed.   ED Course  Procedures (including critical care time) Labs Review Labs Reviewed  CBC - Abnormal; Notable for the following:    Platelets 93 (*)    All other components within normal limits  BASIC METABOLIC PANEL   BRAIN NATRIURETIC PEPTIDE  I-STAT TROPOININ, ED    Imaging Review Dg Chest 2 View  03/31/2015   CLINICAL DATA:  54 year old male with nonproductive cough and shortness of breath for 1 month following COPD medication change. Initial encounter.  EXAM: CHEST  2 VIEW  COMPARISON:  09/15/2014 and earlier.  FINDINGS: Stable lung volumes at the upper limits of normal. Normal cardiac size and mediastinal contours. Visualized tracheal air column is within  normal limits. No pneumothorax, pulmonary edema, pleural effusion or confluent pulmonary opacity. No acute osseous abnormality identified.  IMPRESSION: No acute cardiopulmonary abnormality.   Electronically Signed   By: Genevie Ann M.D.   On: 03/31/2015 11:27     EKG Interpretation   Date/Time:  Tuesday March 31 2015 10:25:34 EDT Ventricular Rate:  100 PR Interval:  140 QRS Duration: 88 QT Interval:  372 QTC Calculation: 479 R Axis:   29 Text Interpretation:  Normal sinus rhythm Normal ECG Confirmed by  Alvino Chapel  MD, Ovid Curd [phone]) on 03/31/2015 12:17:18 PM      MDM   Final diagnoses:  Cough    No infection noted. On x-ray. Pt is not having chest pain. Pt is okay to follow up with pcp. Will switch his inhaler back to non powdered. Discussed return precautions with pt.   Glendell Docker, NP 03/31/15 [id number]  Davonna Belling, MD 04/02/15 [phone]

## 2015-03-31 NOTE — Discharge Instructions (Signed)
Cough, Adult   A cough is a reflex. It helps you clear your throat and airways. A cough can help heal your body. A cough can last 2 or 3 weeks (acute) or may last more than 8 weeks (chronic). Some common causes of a cough can include an infection, allergy, or a cold.  HOME CARE  · Only take medicine as told by your doctor.  · If given, take your medicines (antibiotics) as told. Finish them even if you start to feel better.  · Use a cold steam vaporizer or humidifier in your home. This can help loosen thick spit (secretions).  · Sleep so you are almost sitting up (semi-upright). Use pillows to do this. This helps reduce coughing.  · Rest as needed.  · Stop smoking if you smoke.  GET HELP RIGHT AWAY IF:  · You have yellowish-white fluid (pus) in your thick spit.  · Your cough gets worse.  · Your medicine does not reduce coughing, and you are losing sleep.  · You cough up blood.  · You have trouble breathing.  · Your pain gets worse and medicine does not help.  · You have a fever.  MAKE SURE YOU:   · Understand these instructions.  · Will watch your condition.  · Will get help right away if you are not doing well or get worse.  Document Released: 05/26/2011 Document Revised: 01/27/2014 Document Reviewed: 05/26/2011  [employer]® Patient Information ©2015 [employer]. This information is not intended to replace advice given to you by your health care provider. Make sure you discuss any questions you have with your health care provider.

## 2015-03-31 NOTE — ED Notes (Signed)
Pt states that he has had a dry cough and SOB since being changed from an aerosol for powder for his inhaler.

## 2015-03-31 NOTE — ED Notes (Signed)
Tamala Julian NP at bedside.

## 2015-03-31 NOTE — ED Notes (Signed)
Pt requested water to drink pt given the same ok by Alvino Chapel

## 2015-04-01 ENCOUNTER — Encounter: Payer: Self-pay | Admitting: Internal Medicine

## 2015-04-01 ENCOUNTER — Other Ambulatory Visit: Payer: Self-pay | Admitting: Internal Medicine

## 2015-04-16 ENCOUNTER — Telehealth: Payer: Self-pay | Admitting: Internal Medicine

## 2015-04-16 ENCOUNTER — Ambulatory Visit (INDEPENDENT_AMBULATORY_CARE_PROVIDER_SITE_OTHER): Payer: Medicare Other | Admitting: Internal Medicine

## 2015-04-16 ENCOUNTER — Encounter: Payer: Self-pay | Admitting: Internal Medicine

## 2015-04-16 VITALS — BP 112/62 | HR 93 | Temp 97.8°F | Resp 16 | Ht 75.0 in | Wt 178.0 lb

## 2015-04-16 DIAGNOSIS — M170 Bilateral primary osteoarthritis of knee: Secondary | ICD-10-CM

## 2015-04-16 DIAGNOSIS — M545 Low back pain, unspecified: Secondary | ICD-10-CM

## 2015-04-16 DIAGNOSIS — I10 Essential (primary) hypertension: Secondary | ICD-10-CM | POA: Diagnosis not present

## 2015-04-16 MED ORDER — HYDROCODONE-ACETAMINOPHEN 10-325 MG PO TABS: ORAL_TABLET | ORAL | Status: DC

## 2015-04-16 NOTE — Telephone Encounter (Signed)
Patient states Dr. Ronnald Ramp was suppose to give him a script for this month for pain meds but he only gave him one for next.  Patients phone cut off.  I am assuming this was for the hydrocodone.

## 2015-04-16 NOTE — Progress Notes (Signed)
Subjective:  Patient ID: [name], male    DOB: 07-27-1961  Age: 54 y.o. MRN: [id number]  CC: Back Pain; Osteoarthritis; and Hypertension   HPI [name] presents for a blood pressure check and for medications for chronic low back pain and chronic knee pain. He has been out of Norco for several days and says his back pain and knee pain are recurring. He requests a refill. 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  Outpatient Prescriptions Prior to Visit  Medication Sig Dispense Refill  . albuterol (PROVENTIL HFA;VENTOLIN HFA) 108 (90 BASE) MCG/ACT inhaler Inhale 1-2 puffs into the lungs every 6 (six) hours as needed for wheezing or shortness of breath. 1 Inhaler 0  . ALPRAZolam (XANAX) 1 MG tablet Take 1 tablet (1 mg total) by mouth 2 (two) times daily as needed for anxiety. 60 tablet 3  . Blood Glucose Calibration (ELEMENT CONTROL) NORMAL LIQD as directed.      . carvedilol (COREG) 12.5 MG tablet take 1 tablet by mouth twice a day with meals 180 tablet 3  . emtricitabine-tenofovir (TRUVADA) 200-300 MG per tablet Take 1 tablet by mouth daily. 90 tablet 0  . esomeprazole (NEXIUM) 40 MG capsule Take 1 capsule (40 mg total) by mouth daily before breakfast. 90 capsule 3  . Fluticasone Furoate-Vilanterol (BREO ELLIPTA) 100-25 MCG/INH AEPB Inhale 1 puff into the lungs daily. 30 each 11  . LOVAZA 1 G capsule take 2 capsules by mouth twice a day 120 capsule 11  . omeprazole (PRILOSEC) 20 MG capsule Take 1 capsule (20 mg total) by mouth daily. 30 capsule 0  . PARoxetine (PAXIL) 30 MG tablet TAKE 1 TABLET BY MOUTH EVERY MORNING 90 tablet 3  . Pitavastatin Calcium (LIVALO) 2 MG TABS Take 1 tablet (2 mg total) by mouth daily. 90 tablet 3  . QUEtiapine (SEROQUEL XR) 50 MG TB24 24 hr tablet Take 1 tablet (50 mg total) by mouth at bedtime. 90 each 3  . tiotropium (SPIRIVA HANDIHALER) 18 MCG inhalation capsule PLACE 1 CAPSULE INTO THE INHALER AND INHALE DAILY  90 capsule 3  . traZODone (DESYREL) 100 MG tablet take 3 tablets by mouth at bedtime 90 tablet 11  . HYDROcodone-acetaminophen (NORCO) 10-325 MG per tablet TAKE 1  TABLET BY MOUTH EVERY 4 HOURS AS NEEDED FOR PAIN with limit 4 per day 100 tablet 0   No facility-administered medications prior to visit.    ROS Review of Systems  Constitutional: Negative.  Negative for fever, chills, diaphoresis, appetite change and fatigue.  HENT: Negative.   Eyes: Negative.   Respiratory: Negative.  Negative for cough, choking, chest tightness, shortness of breath and stridor.   Cardiovascular: Negative.  Negative for chest pain, palpitations and leg swelling.  Gastrointestinal: Negative.  Negative for nausea, vomiting, abdominal pain, diarrhea, constipation and blood in stool.  Endocrine: Negative.   Genitourinary: Negative.   Musculoskeletal: Positive for back pain and arthralgias.  Skin: Negative.  Negative for rash.  Allergic/Immunologic: Negative.   Neurological: Negative.  Negative for dizziness, tremors, weakness, light-headedness and headaches.  Hematological: Negative.  Negative for adenopathy. Does not bruise/bleed easily.  Psychiatric/Behavioral: Positive for sleep disturbance. Negative for suicidal ideas, hallucinations, behavioral problems, confusion, self-injury, dysphoric mood, decreased concentration and agitation. The patient is nervous/anxious. The patient is not hyperactive.     Objective:  BP 112/62 mmHg  Pulse 93  Temp(Src) 97.8 F (36.6 C) (Oral)  Resp 16  Ht  $'6\' 3"'O$  (1.905 m)  Wt 178 lb (80.74 kg)  BMI 22.25 kg/m2  SpO2 96%  BP Readings from Last 3 Encounters:  04/16/15 112/62  03/31/15 145/95  01/22/15 120/80    Wt Readings from Last 3 Encounters:  04/16/15 178 lb (80.74 kg)  03/31/15 178 lb (80.74 kg)  01/22/15 188 lb (85.276 kg)    Physical Exam  Constitutional: He is oriented to person, place, and time. No distress.  HENT:  Mouth/Throat: Oropharynx is clear  and moist. No oropharyngeal exudate.  Eyes: Conjunctivae are normal. Right eye exhibits no discharge. Left eye exhibits no discharge. No scleral icterus.  Neck: Normal range of motion. Neck supple. No JVD present. No tracheal deviation present. No thyromegaly present.  Cardiovascular: Normal rate, regular rhythm, normal heart sounds and intact distal pulses.  Exam reveals no gallop and no friction rub.   No murmur heard. Pulmonary/Chest: Effort normal and breath sounds normal. No stridor. No respiratory distress. He has no wheezes. He has no rales. He exhibits no tenderness.  Abdominal: Soft. Bowel sounds are normal. He exhibits no distension and no mass. There is no tenderness. There is no rebound and no guarding.  Musculoskeletal: Normal range of motion. He exhibits no edema or tenderness.       Right knee: He exhibits deformity (djd). He exhibits normal range of motion, no swelling, no effusion, no ecchymosis, no laceration, no erythema, normal alignment, no LCL laxity, normal patellar mobility and no bony tenderness. No tenderness found.       Left knee: He exhibits deformity (djd). He exhibits normal range of motion, no swelling, no effusion, no ecchymosis, no laceration, no erythema, normal alignment, no LCL laxity, normal patellar mobility and no bony tenderness. No tenderness found.  Lymphadenopathy:    He has no cervical adenopathy.  Neurological: He is oriented to person, place, and time.  Skin: Skin is warm and dry. No rash noted. He is not diaphoretic. No erythema. No pallor.  Psychiatric: He has a normal mood and affect. His behavior is normal. Judgment and thought content normal. His mood appears not anxious. His speech is not rapid and/or pressured, not delayed and not tangential. He is not agitated and not aggressive. Cognition and memory are normal. He does not exhibit a depressed mood.  Vitals reviewed.   Lab Results  Component Value Date   WBC 4.1 03/31/2015   HGB 15.4  03/31/2015   HCT 44.4 03/31/2015   PLT 93* 03/31/2015   GLUCOSE 98 03/31/2015   CHOL 208* 07/08/2014   TRIG 88.0 07/08/2014   HDL 47.60 07/08/2014   LDLDIRECT 71.2 09/29/2011   LDLCALC 143* 07/08/2014   ALT 39 01/22/2015   AST 30 01/22/2015   NA 138 03/31/2015   K 4.3 03/31/2015   CL 106 03/31/2015   CREATININE 0.70 03/31/2015   BUN 6 03/31/2015   CO2 23 03/31/2015   TSH 1.18 07/08/2014   PSA 0.69 12/05/2013   INR 0.94 12/04/2014   HGBA1C 5.6 01/22/2015   MICROALBUR 0.2 10/08/2008    Dg Chest 2 View  03/31/2015   CLINICAL DATA:  54 year old male with nonproductive cough and shortness of breath for 1 month following COPD medication change. Initial encounter.  EXAM: CHEST  2 VIEW  COMPARISON:  09/15/2014 and earlier.  FINDINGS: Stable lung volumes at the upper limits of normal. Normal cardiac size and mediastinal contours. Visualized tracheal air column is within normal limits. No pneumothorax, pulmonary edema, pleural effusion or confluent pulmonary opacity. No acute  osseous abnormality identified.  IMPRESSION: No acute cardiopulmonary abnormality.   Electronically Signed   By: Genevie Ann M.D.   On: 03/31/2015 11:27    Assessment & Plan:   [name] was seen today for back pain, osteoarthritis and hypertension.  Diagnoses and all orders for this visit:  HYPERTENSION, BENIGN- his blood pressures well controlled.  Primary osteoarthritis of both knees- will continue Norco as needed for pain. I cautioned him about combining narcotics and alcohol as this may increase the risk of complications. Will decrease the Norco dose to 1 tablet twice a day. He agrees to try to stop consuming alcohol. Orders: -     Discontinue: HYDROcodone-acetaminophen (NORCO) 10-325 MG per tablet; TAKE 1  TABLET BY MOUTH EVERY 12 HOURS AS NEEDED FOR PAIN with limit of 2 per day -     Discontinue: HYDROcodone-acetaminophen (NORCO) 10-325 MG per tablet; TAKE 1  TABLET BY MOUTH EVERY 12 HOURS AS NEEDED FOR PAIN with  limit of 2 per day -     Discontinue: HYDROcodone-acetaminophen (NORCO) 10-325 MG per tablet; TAKE 1  TABLET BY MOUTH EVERY 12 HOURS AS NEEDED FOR PAIN with limit of 2 per day -     HYDROcodone-acetaminophen (NORCO) 10-325 MG per tablet; TAKE 1  TABLET BY MOUTH EVERY 12 HOURS AS NEEDED FOR PAIN with limit of 2 per day -     HYDROcodone-acetaminophen (NORCO) 10-325 MG per tablet; TAKE 1  TABLET BY MOUTH EVERY 12 HOURS AS NEEDED FOR PAIN with limit of 2 per day  Low back pain without sciatica, unspecified back pain laterality  Midline low back pain without sciatica Orders: -     Discontinue: HYDROcodone-acetaminophen (NORCO) 10-325 MG per tablet; TAKE 1  TABLET BY MOUTH EVERY 12 HOURS AS NEEDED FOR PAIN with limit of 2 per day -     Discontinue: HYDROcodone-acetaminophen (NORCO) 10-325 MG per tablet; TAKE 1  TABLET BY MOUTH EVERY 12 HOURS AS NEEDED FOR PAIN with limit of 2 per day -     Discontinue: HYDROcodone-acetaminophen (NORCO) 10-325 MG per tablet; TAKE 1  TABLET BY MOUTH EVERY 12 HOURS AS NEEDED FOR PAIN with limit of 2 per day -     HYDROcodone-acetaminophen (NORCO) 10-325 MG per tablet; TAKE 1  TABLET BY MOUTH EVERY 12 HOURS AS NEEDED FOR PAIN with limit of 2 per day -     HYDROcodone-acetaminophen (NORCO) 10-325 MG per tablet; TAKE 1  TABLET BY MOUTH EVERY 12 HOURS AS NEEDED FOR PAIN with limit of 2 per day  I am having [name] maintain his ELEMENT CONTROL, esomeprazole, PARoxetine, LOVAZA, QUEtiapine, traZODone, omeprazole, carvedilol, tiotropium, Pitavastatin Calcium, Fluticasone Furoate-Vilanterol, ALPRAZolam, albuterol, emtricitabine-tenofovir, HYDROcodone-acetaminophen, and HYDROcodone-acetaminophen.  Meds ordered this encounter  Medications  . DISCONTD: HYDROcodone-acetaminophen (NORCO) 10-325 MG per tablet    Sig: TAKE 1  TABLET BY MOUTH EVERY 12 HOURS AS NEEDED FOR PAIN with limit of 2 per day    Dispense:  60 tablet    Refill:  0    Fill on or after 04/16/15  . DISCONTD:  HYDROcodone-acetaminophen (NORCO) 10-325 MG per tablet    Sig: TAKE 1  TABLET BY MOUTH EVERY 12 HOURS AS NEEDED FOR PAIN with limit of 2 per day    Dispense:  60 tablet    Refill:  0    Fill on or after 05/17/15  . DISCONTD: HYDROcodone-acetaminophen (NORCO) 10-325 MG per tablet    Sig: TAKE 1  TABLET BY MOUTH EVERY 12 HOURS AS NEEDED FOR  PAIN with limit of 2 per day    Dispense:  60 tablet    Refill:  0    Fill on or after 06/17/15  . HYDROcodone-acetaminophen (NORCO) 10-325 MG per tablet    Sig: TAKE 1  TABLET BY MOUTH EVERY 12 HOURS AS NEEDED FOR PAIN with limit of 2 per day    Dispense:  60 tablet    Refill:  0    Fill on or after 07/17/15  . HYDROcodone-acetaminophen (NORCO) 10-325 MG per tablet    Sig: TAKE 1  TABLET BY MOUTH EVERY 12 HOURS AS NEEDED FOR PAIN with limit of 2 per day    Dispense:  60 tablet    Refill:  0    Fill on or after 04/16/15     Follow-up: Return in about 4 months (around 08/17/2015).  Scarlette Calico, MD

## 2015-04-16 NOTE — Patient Instructions (Signed)
Back Pain, Adult Low back pain is very common. About 1 in 5 people have back pain.The cause of low back pain is rarely dangerous. The pain often gets better over time.About half of people with a sudden onset of back pain feel better in just 2 weeks. About 8 in 10 people feel better by 6 weeks.  CAUSES Some common causes of back pain include:  Strain of the muscles or ligaments supporting the spine.  Wear and tear (degeneration) of the spinal discs.  Arthritis.  Direct injury to the back. DIAGNOSIS Most of the time, the direct cause of low back pain is not known.However, back pain can be treated effectively even when the exact cause of the pain is unknown.Answering your caregiver's questions about your overall health and symptoms is one of the most accurate ways to make sure the cause of your pain is not dangerous. If your caregiver needs more information, he or she may order lab work or imaging tests (X-rays or MRIs).However, even if imaging tests show changes in your back, this usually does not require surgery. HOME CARE INSTRUCTIONS For many people, back pain returns.Since low back pain is rarely dangerous, it is often a condition that people can learn to manageon their own.   Remain active. It is stressful on the back to sit or stand in one place. Do not sit, drive, or stand in one place for more than 30 minutes at a time. Take short walks on level surfaces as soon as pain allows.Try to increase the length of time you walk each day.  Do not stay in bed.Resting more than 1 or 2 days can delay your recovery.  Do not avoid exercise or work.Your body is made to move.It is not dangerous to be active, even though your back may hurt.Your back will likely heal faster if you return to being active before your pain is gone.  Pay attention to your body when you bend and lift. Many people have less discomfortwhen lifting if they bend their knees, keep the load close to their bodies,and  avoid twisting. Often, the most comfortable positions are those that put less stress on your recovering back.  Find a comfortable position to sleep. Use a firm mattress and lie on your side with your knees slightly bent. If you lie on your back, put a pillow under your knees.  Only take over-the-counter or prescription medicines as directed by your caregiver. Over-the-counter medicines to reduce pain and inflammation are often the most helpful.Your caregiver may prescribe muscle relaxant drugs.These medicines help dull your pain so you can more quickly return to your normal activities and healthy exercise.  Put ice on the injured area.  Put ice in a plastic bag.  Place a towel between your skin and the bag.  Leave the ice on for 15-20 minutes, 03-04 times a day for the first 2 to 3 days. After that, ice and heat may be alternated to reduce pain and spasms.  Ask your caregiver about trying back exercises and gentle massage. This may be of some benefit.  Avoid feeling anxious or stressed.Stress increases muscle tension and can worsen back pain.It is important to recognize when you are anxious or stressed and learn ways to manage it.Exercise is a great option. SEEK MEDICAL CARE IF:  You have pain that is not relieved with rest or medicine.  You have pain that does not improve in 1 week.  You have new symptoms.  You are generally not feeling well. SEEK   IMMEDIATE MEDICAL CARE IF:   You have pain that radiates from your back into your legs.  You develop new bowel or bladder control problems.  You have unusual weakness or numbness in your arms or legs.  You develop nausea or vomiting.  You develop abdominal pain.  You feel faint. Document Released: 09/12/2005 Document Revised: 03/13/2012 Document Reviewed: 01/14/2014 [employer] Patient Information 2015 [employer]. This information is not intended to replace advice given to you by your health care provider. Make sure you  discuss any questions you have with your health care provider.  

## 2015-04-16 NOTE — Progress Notes (Signed)
Pre visit review using our clinic review tool, if applicable. No additional management support is needed unless otherwise documented below in the visit note. 

## 2015-04-23 ENCOUNTER — Other Ambulatory Visit: Payer: Self-pay | Admitting: Internal Medicine

## 2015-04-23 ENCOUNTER — Encounter: Payer: Self-pay | Admitting: Cardiovascular Disease

## 2015-04-27 ENCOUNTER — Ambulatory Visit: Payer: Self-pay | Admitting: Internal Medicine

## 2015-05-21 ENCOUNTER — Encounter: Payer: Self-pay | Admitting: Cardiovascular Disease

## 2015-05-21 ENCOUNTER — Ambulatory Visit: Payer: Self-pay | Admitting: Internal Medicine

## 2015-05-22 ENCOUNTER — Telehealth: Payer: Self-pay

## 2015-05-22 NOTE — Telephone Encounter (Signed)
Pa initiated via covermymeds KEY [id number]

## 2015-05-25 NOTE — Telephone Encounter (Signed)
Pa approved. Pharmacy notified

## 2015-05-27 ENCOUNTER — Telehealth: Payer: Self-pay

## 2015-05-27 NOTE — Telephone Encounter (Signed)
A user error has taken place.

## 2015-06-01 ENCOUNTER — Encounter: Payer: Self-pay | Admitting: Internal Medicine

## 2015-06-18 DIAGNOSIS — F1120 Opioid dependence, uncomplicated: Secondary | ICD-10-CM | POA: Diagnosis not present

## 2015-06-29 ENCOUNTER — Other Ambulatory Visit: Payer: Self-pay | Admitting: Internal Medicine

## 2015-06-29 ENCOUNTER — Encounter: Payer: Self-pay | Admitting: Internal Medicine

## 2015-06-29 DIAGNOSIS — F418 Other specified anxiety disorders: Secondary | ICD-10-CM

## 2015-06-29 NOTE — Telephone Encounter (Signed)
Left msg on triage requesting status on refills. MD is out of the office pls advise...[name]

## 2015-06-30 ENCOUNTER — Telehealth: Payer: Self-pay | Admitting: Internal Medicine

## 2015-06-30 DIAGNOSIS — Z79899 Other long term (current) drug therapy: Secondary | ICD-10-CM | POA: Diagnosis not present

## 2015-06-30 DIAGNOSIS — F418 Other specified anxiety disorders: Secondary | ICD-10-CM

## 2015-06-30 MED ORDER — ALPRAZOLAM 1 MG PO TABS: 1.0000 mg | ORAL_TABLET | Freq: Two times a day (BID) | ORAL | Status: DC | PRN

## 2015-06-30 NOTE — Addendum Note (Signed)
Addended by: Pricilla Holm A on: 06/30/2015 01:50 PM   Modules accepted: Orders

## 2015-06-30 NOTE — Telephone Encounter (Signed)
Needs to pick up in person and needs UDS.

## 2015-06-30 NOTE — Telephone Encounter (Signed)
Replied back to pt email with md response below...[name]

## 2015-06-30 NOTE — Telephone Encounter (Signed)
[facility] on [address] called in to request prescription for albuterol (PROVENTIL HFA;VENTOLIN HFA) 108 (90 BASE) MCG/ACT inhaler [[id number]

## 2015-06-30 NOTE — Telephone Encounter (Signed)
Printed and signed.  

## 2015-07-01 ENCOUNTER — Telehealth: Payer: Self-pay | Admitting: Internal Medicine

## 2015-07-01 DIAGNOSIS — J441 Chronic obstructive pulmonary disease with (acute) exacerbation: Secondary | ICD-10-CM | POA: Diagnosis not present

## 2015-07-01 MED ORDER — ALBUTEROL SULFATE HFA 108 (90 BASE) MCG/ACT IN AERS: 1.0000 | INHALATION_SPRAY | Freq: Four times a day (QID) | RESPIRATORY_TRACT | Status: DC | PRN

## 2015-07-01 NOTE — Telephone Encounter (Signed)
Stacy from [facility] called and they did an overnight pulse ox and he does qualify for nocternal oxygen She can be reached at [phone]

## 2015-07-03 ENCOUNTER — Telehealth: Payer: Self-pay | Admitting: Internal Medicine

## 2015-07-03 NOTE — Telephone Encounter (Signed)
Patient qualified for nocturnal o2. They need approval before faxing in order. Please give them a call.

## 2015-07-04 ENCOUNTER — Other Ambulatory Visit: Payer: Self-pay | Admitting: Internal Medicine

## 2015-07-06 NOTE — Telephone Encounter (Signed)
Stacy from [facility] has called back  Please call her at [phone]

## 2015-07-08 NOTE — Telephone Encounter (Signed)
LMOVM

## 2015-07-09 NOTE — Telephone Encounter (Signed)
Received fax will send will completed

## 2015-07-10 DIAGNOSIS — F1120 Opioid dependence, uncomplicated: Secondary | ICD-10-CM | POA: Diagnosis not present

## 2015-07-11 ENCOUNTER — Emergency Department (HOSPITAL_COMMUNITY): Payer: Medicare Other

## 2015-07-11 ENCOUNTER — Encounter (HOSPITAL_COMMUNITY): Payer: Self-pay | Admitting: Radiology

## 2015-07-11 ENCOUNTER — Emergency Department (HOSPITAL_COMMUNITY)
Admission: EM | Admit: 2015-07-11 | Discharge: 2015-07-11 | Disposition: A | Discharge status: Home / Self Care | Payer: Medicare Other | Attending: Emergency Medicine | Admitting: Emergency Medicine

## 2015-07-11 DIAGNOSIS — J45909 Unspecified asthma, uncomplicated: Secondary | ICD-10-CM | POA: Diagnosis not present

## 2015-07-11 DIAGNOSIS — S31139A Puncture wound of abdominal wall without foreign body, unspecified quadrant without penetration into peritoneal cavity, initial encounter: Secondary | ICD-10-CM | POA: Insufficient documentation

## 2015-07-11 DIAGNOSIS — Y9389 Activity, other specified: Secondary | ICD-10-CM | POA: Insufficient documentation

## 2015-07-11 DIAGNOSIS — S31101A Unspecified open wound of abdominal wall, left upper quadrant without penetration into peritoneal cavity, initial encounter: Secondary | ICD-10-CM | POA: Diagnosis not present

## 2015-07-11 DIAGNOSIS — Y998 Other external cause status: Secondary | ICD-10-CM | POA: Insufficient documentation

## 2015-07-11 DIAGNOSIS — I509 Heart failure, unspecified: Secondary | ICD-10-CM | POA: Insufficient documentation

## 2015-07-11 DIAGNOSIS — T148XXA Other injury of unspecified body region, initial encounter: Secondary | ICD-10-CM

## 2015-07-11 DIAGNOSIS — S3991XA Unspecified injury of abdomen, initial encounter: Secondary | ICD-10-CM | POA: Diagnosis present

## 2015-07-11 DIAGNOSIS — S31000A Unspecified open wound of lower back and pelvis without penetration into retroperitoneum, initial encounter: Secondary | ICD-10-CM | POA: Diagnosis not present

## 2015-07-11 DIAGNOSIS — I10 Essential (primary) hypertension: Secondary | ICD-10-CM | POA: Diagnosis not present

## 2015-07-11 DIAGNOSIS — Y9289 Other specified places as the place of occurrence of the external cause: Secondary | ICD-10-CM | POA: Diagnosis not present

## 2015-07-11 DIAGNOSIS — W260XXA Contact with knife, initial encounter: Secondary | ICD-10-CM | POA: Diagnosis not present

## 2015-07-11 DIAGNOSIS — Z23 Encounter for immunization: Secondary | ICD-10-CM | POA: Diagnosis not present

## 2015-07-11 DIAGNOSIS — R030 Elevated blood-pressure reading, without diagnosis of hypertension: Secondary | ICD-10-CM | POA: Diagnosis not present

## 2015-07-11 DIAGNOSIS — S31119A Laceration without foreign body of abdominal wall, unspecified quadrant without penetration into peritoneal cavity, initial encounter: Secondary | ICD-10-CM | POA: Diagnosis not present

## 2015-07-11 DIAGNOSIS — S3992XA Unspecified injury of lower back, initial encounter: Secondary | ICD-10-CM | POA: Diagnosis not present

## 2015-07-11 HISTORY — DX: Unspecified asthma, uncomplicated: J45.909

## 2015-07-11 HISTORY — DX: Heart failure, unspecified: I50.9

## 2015-07-11 LAB — CBC
HEMATOCRIT: 50.5 % (ref 39.0–52.0)
HEMOGLOBIN: 17.9 g/dL — AB (ref 13.0–17.0)
MCH: 33.3 pg (ref 26.0–34.0)
MCHC: 35.4 g/dL (ref 30.0–36.0)
MCV: 94 fL (ref 78.0–100.0)
Platelets: 127 10*3/uL — ABNORMAL LOW (ref 150–400)
RBC: 5.37 MIL/uL (ref 4.22–5.81)
RDW: 15 % (ref 11.5–15.5)
WBC: 8.7 10*3/uL (ref 4.0–10.5)

## 2015-07-11 LAB — TYPE AND SCREEN
ABO/RH(D): O POS
Antibody Screen: NEGATIVE
UNIT DIVISION: 0
UNIT DIVISION: 0

## 2015-07-11 LAB — COMPREHENSIVE METABOLIC PANEL
ALK PHOS: 64 U/L (ref 38–126)
ALT: 31 U/L (ref 17–63)
ANION GAP: 11 (ref 5–15)
AST: 39 U/L (ref 15–41)
Albumin: 4.1 g/dL (ref 3.5–5.0)
BILIRUBIN TOTAL: 0.8 mg/dL (ref 0.3–1.2)
BUN: 7 mg/dL (ref 6–20)
CALCIUM: 8.9 mg/dL (ref 8.9–10.3)
CO2: 24 mmol/L (ref 22–32)
Chloride: 108 mmol/L (ref 101–111)
Creatinine, Ser: 0.75 mg/dL (ref 0.61–1.24)
GFR calc Af Amer: >60 mL/min (ref >60)
GLUCOSE: 122 mg/dL — AB (ref 65–99)
POTASSIUM: 4.2 mmol/L (ref 3.5–5.1)
Sodium: 143 mmol/L (ref 135–145)
TOTAL PROTEIN: 7.1 g/dL (ref 6.5–8.1)

## 2015-07-11 LAB — PREPARE FRESH FROZEN PLASMA
UNIT DIVISION: 0
UNIT DIVISION: 0

## 2015-07-11 LAB — CDS SEROLOGY

## 2015-07-11 LAB — PROTIME-INR
INR: 0.88 (ref 0.00–1.49)
PROTHROMBIN TIME: 12.2 s (ref 11.6–15.2)

## 2015-07-11 LAB — ABO/RH: ABO/RH(D): O POS

## 2015-07-11 LAB — ETHANOL: ALCOHOL ETHYL (B): 312 mg/dL — AB (ref <5)

## 2015-07-11 MED ORDER — SODIUM CHLORIDE 0.9 % IV BOLUS (SEPSIS)
1000.0000 mL | Freq: Once | INTRAVENOUS | Status: AC
  Administered 2015-07-11: 1000 mL via INTRAVENOUS

## 2015-07-11 MED ORDER — TETANUS-DIPHTH-ACELL PERTUSSIS 5-2.5-18.5 LF-MCG/0.5 IM SUSP
0.5000 mL | Freq: Once | INTRAMUSCULAR | Status: AC
  Administered 2015-07-11: 0.5 mL via INTRAMUSCULAR
  Filled 2015-07-11: qty 0.5

## 2015-07-11 MED ORDER — LIDOCAINE-EPINEPHRINE (PF) 2 %-1:200000 IJ SOLN
INTRAMUSCULAR | Status: AC
  Filled 2015-07-11: qty 20

## 2015-07-11 MED ORDER — IOHEXOL 300 MG/ML  SOLN
100.0000 mL | Freq: Once | INTRAMUSCULAR | Status: AC | PRN
  Administered 2015-07-11: 100 mL via INTRAVENOUS
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
## 2015-07-11 NOTE — ED Notes (Signed)
[employer] at bedside 
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
## 2015-07-11 NOTE — Progress Notes (Signed)
   07/11/15 0200  Clinical Encounter Type  Visited With Health care provider  Visit Type Critical Care;ED;Trauma  Referral From Care management  Consult/Referral To None  Stress Factors  Patient Stress Factors Health changes  Advance Directives (For Healthcare)  Does patient have an advance directive? No   Chaplain has notify Pt's HC power of Attorney.
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
## 2015-07-13 ENCOUNTER — Encounter: Payer: Self-pay | Admitting: Internal Medicine

## 2015-07-14 ENCOUNTER — Telehealth: Payer: Self-pay | Admitting: Internal Medicine

## 2015-07-14 NOTE — Telephone Encounter (Signed)
Paperwork given to Dr. Ronnald Ramp, have not received back yet

## 2015-07-14 NOTE — Telephone Encounter (Signed)
Following up on order, chart note request and test results that was faxed over on 10/12.  Please follow up in regards.  Will refax again today.

## 2015-07-17 ENCOUNTER — Emergency Department (HOSPITAL_COMMUNITY)
Admission: EM | Admit: 2015-07-17 | Discharge: 2015-07-17 | Disposition: A | Discharge status: Home / Self Care | Payer: Medicare Other | Attending: Emergency Medicine | Admitting: Emergency Medicine

## 2015-07-17 ENCOUNTER — Encounter (HOSPITAL_COMMUNITY): Payer: Self-pay | Admitting: Emergency Medicine

## 2015-07-17 DIAGNOSIS — J45909 Unspecified asthma, uncomplicated: Secondary | ICD-10-CM | POA: Diagnosis not present

## 2015-07-17 DIAGNOSIS — I509 Heart failure, unspecified: Secondary | ICD-10-CM | POA: Diagnosis not present

## 2015-07-17 DIAGNOSIS — I10 Essential (primary) hypertension: Secondary | ICD-10-CM | POA: Diagnosis not present

## 2015-07-17 DIAGNOSIS — K219 Gastro-esophageal reflux disease without esophagitis: Secondary | ICD-10-CM | POA: Diagnosis not present

## 2015-07-17 DIAGNOSIS — Z720 Tobacco use: Secondary | ICD-10-CM | POA: Diagnosis not present

## 2015-07-17 DIAGNOSIS — Z8619 Personal history of other infectious and parasitic diseases: Secondary | ICD-10-CM | POA: Diagnosis not present

## 2015-07-17 DIAGNOSIS — Z862 Personal history of diseases of the blood and blood-forming organs and certain disorders involving the immune mechanism: Secondary | ICD-10-CM | POA: Diagnosis not present

## 2015-07-17 DIAGNOSIS — Z4802 Encounter for removal of sutures: Secondary | ICD-10-CM | POA: Insufficient documentation

## 2015-07-17 DIAGNOSIS — E119 Type 2 diabetes mellitus without complications: Secondary | ICD-10-CM | POA: Diagnosis not present

## 2015-07-17 DIAGNOSIS — Z8711 Personal history of peptic ulcer disease: Secondary | ICD-10-CM | POA: Diagnosis not present

## 2015-07-17 DIAGNOSIS — Z79899 Other long term (current) drug therapy: Secondary | ICD-10-CM | POA: Diagnosis not present

## 2015-07-17 DIAGNOSIS — Z86010 Personal history of colonic polyps: Secondary | ICD-10-CM | POA: Diagnosis not present

## 2015-07-17 NOTE — Discharge Instructions (Signed)
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
## 2015-07-17 NOTE — ED Notes (Signed)
Pt here for suture removal of stiches in his back. Sutures placed last Friday.

## 2015-07-19 ENCOUNTER — Encounter (HOSPITAL_COMMUNITY): Payer: Self-pay | Admitting: Emergency Medicine

## 2015-07-19 ENCOUNTER — Emergency Department (HOSPITAL_COMMUNITY)
Admission: EM | Admit: 2015-07-19 | Discharge: 2015-07-19 | Disposition: A | Discharge status: Home / Self Care | Payer: No Typology Code available for payment source | Attending: Emergency Medicine | Admitting: Emergency Medicine

## 2015-07-19 DIAGNOSIS — S3992XA Unspecified injury of lower back, initial encounter: Secondary | ICD-10-CM | POA: Diagnosis present

## 2015-07-19 DIAGNOSIS — I10 Essential (primary) hypertension: Secondary | ICD-10-CM | POA: Diagnosis not present

## 2015-07-19 DIAGNOSIS — Z8619 Personal history of other infectious and parasitic diseases: Secondary | ICD-10-CM | POA: Diagnosis not present

## 2015-07-19 DIAGNOSIS — Y9389 Activity, other specified: Secondary | ICD-10-CM | POA: Insufficient documentation

## 2015-07-19 DIAGNOSIS — Z862 Personal history of diseases of the blood and blood-forming organs and certain disorders involving the immune mechanism: Secondary | ICD-10-CM | POA: Diagnosis not present

## 2015-07-19 DIAGNOSIS — Y998 Other external cause status: Secondary | ICD-10-CM | POA: Diagnosis not present

## 2015-07-19 DIAGNOSIS — I509 Heart failure, unspecified: Secondary | ICD-10-CM | POA: Diagnosis not present

## 2015-07-19 DIAGNOSIS — K219 Gastro-esophageal reflux disease without esophagitis: Secondary | ICD-10-CM | POA: Insufficient documentation

## 2015-07-19 DIAGNOSIS — J45909 Unspecified asthma, uncomplicated: Secondary | ICD-10-CM | POA: Diagnosis not present

## 2015-07-19 DIAGNOSIS — Z86010 Personal history of colonic polyps: Secondary | ICD-10-CM | POA: Insufficient documentation

## 2015-07-19 DIAGNOSIS — Y92410 Unspecified street and highway as the place of occurrence of the external cause: Secondary | ICD-10-CM | POA: Diagnosis not present

## 2015-07-19 DIAGNOSIS — Z79899 Other long term (current) drug therapy: Secondary | ICD-10-CM | POA: Diagnosis not present

## 2015-07-19 DIAGNOSIS — Z720 Tobacco use: Secondary | ICD-10-CM | POA: Insufficient documentation

## 2015-07-19 DIAGNOSIS — E119 Type 2 diabetes mellitus without complications: Secondary | ICD-10-CM | POA: Insufficient documentation

## 2015-07-19 DIAGNOSIS — Z8711 Personal history of peptic ulcer disease: Secondary | ICD-10-CM | POA: Diagnosis not present

## 2015-07-19 DIAGNOSIS — S80211A Abrasion, right knee, initial encounter: Secondary | ICD-10-CM | POA: Insufficient documentation

## 2015-07-19 MED ORDER — IBUPROFEN 800 MG PO TABS
800.0000 mg | ORAL_TABLET | Freq: Once | ORAL | Status: AC
  Administered 2015-07-19: 800 mg via ORAL
  Filled 2015-07-19: qty 1
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
## 2015-07-19 NOTE — Discharge Instructions (Signed)
Take motrin 800 mg every 6hrs for pain.   You will be stiff and sore tomorrow. Rest for several days.  See your doctor.   Return to ER if you have severe headaches, neck pain, chest pain, abdominal pain, trouble walking.
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
## 2015-07-21 ENCOUNTER — Ambulatory Visit: Payer: Self-pay | Admitting: Internal Medicine

## 2015-07-22 ENCOUNTER — Telehealth: Payer: Self-pay | Admitting: Internal Medicine

## 2015-07-22 NOTE — Telephone Encounter (Signed)
Is requesting call back in regards to documents for a pulseox.

## 2015-07-24 ENCOUNTER — Emergency Department (HOSPITAL_COMMUNITY)
Admission: EM | Admit: 2015-07-24 | Discharge: 2015-07-24 | Disposition: A | Discharge status: Home / Self Care | Payer: No Typology Code available for payment source | Attending: Emergency Medicine | Admitting: Emergency Medicine

## 2015-07-24 ENCOUNTER — Encounter (HOSPITAL_COMMUNITY): Payer: Self-pay

## 2015-07-24 ENCOUNTER — Emergency Department (HOSPITAL_COMMUNITY): Payer: No Typology Code available for payment source

## 2015-07-24 DIAGNOSIS — Y92410 Unspecified street and highway as the place of occurrence of the external cause: Secondary | ICD-10-CM | POA: Diagnosis not present

## 2015-07-24 DIAGNOSIS — Z8711 Personal history of peptic ulcer disease: Secondary | ICD-10-CM | POA: Insufficient documentation

## 2015-07-24 DIAGNOSIS — Z79899 Other long term (current) drug therapy: Secondary | ICD-10-CM | POA: Diagnosis not present

## 2015-07-24 DIAGNOSIS — Y998 Other external cause status: Secondary | ICD-10-CM | POA: Diagnosis not present

## 2015-07-24 DIAGNOSIS — Y9389 Activity, other specified: Secondary | ICD-10-CM | POA: Diagnosis not present

## 2015-07-24 DIAGNOSIS — Z87438 Personal history of other diseases of male genital organs: Secondary | ICD-10-CM | POA: Insufficient documentation

## 2015-07-24 DIAGNOSIS — I10 Essential (primary) hypertension: Secondary | ICD-10-CM | POA: Diagnosis not present

## 2015-07-24 DIAGNOSIS — Z8619 Personal history of other infectious and parasitic diseases: Secondary | ICD-10-CM | POA: Diagnosis not present

## 2015-07-24 DIAGNOSIS — R0789 Other chest pain: Secondary | ICD-10-CM | POA: Diagnosis not present

## 2015-07-24 DIAGNOSIS — K219 Gastro-esophageal reflux disease without esophagitis: Secondary | ICD-10-CM | POA: Diagnosis not present

## 2015-07-24 DIAGNOSIS — S299XXA Unspecified injury of thorax, initial encounter: Secondary | ICD-10-CM | POA: Diagnosis not present

## 2015-07-24 DIAGNOSIS — F209 Schizophrenia, unspecified: Secondary | ICD-10-CM | POA: Insufficient documentation

## 2015-07-24 DIAGNOSIS — E119 Type 2 diabetes mellitus without complications: Secondary | ICD-10-CM | POA: Diagnosis not present

## 2015-07-24 DIAGNOSIS — Z86010 Personal history of colonic polyps: Secondary | ICD-10-CM | POA: Insufficient documentation

## 2015-07-24 DIAGNOSIS — M542 Cervicalgia: Secondary | ICD-10-CM | POA: Insufficient documentation

## 2015-07-24 DIAGNOSIS — Z862 Personal history of diseases of the blood and blood-forming organs and certain disorders involving the immune mechanism: Secondary | ICD-10-CM | POA: Diagnosis not present

## 2015-07-24 DIAGNOSIS — R911 Solitary pulmonary nodule: Secondary | ICD-10-CM

## 2015-07-24 DIAGNOSIS — M545 Low back pain: Secondary | ICD-10-CM | POA: Insufficient documentation

## 2015-07-24 DIAGNOSIS — I509 Heart failure, unspecified: Secondary | ICD-10-CM | POA: Insufficient documentation

## 2015-07-24 DIAGNOSIS — R918 Other nonspecific abnormal finding of lung field: Secondary | ICD-10-CM | POA: Diagnosis not present

## 2015-07-24 DIAGNOSIS — R079 Chest pain, unspecified: Secondary | ICD-10-CM | POA: Diagnosis not present

## 2015-07-24 DIAGNOSIS — Z720 Tobacco use: Secondary | ICD-10-CM | POA: Insufficient documentation

## 2015-07-24 DIAGNOSIS — J441 Chronic obstructive pulmonary disease with (acute) exacerbation: Secondary | ICD-10-CM | POA: Insufficient documentation

## 2015-07-24 DIAGNOSIS — Z7951 Long term (current) use of inhaled steroids: Secondary | ICD-10-CM | POA: Insufficient documentation

## 2015-07-24 MED ORDER — IPRATROPIUM-ALBUTEROL 0.5-2.5 (3) MG/3ML IN SOLN
3.0000 mL | Freq: Once | RESPIRATORY_TRACT | Status: AC
  Administered 2015-07-24: 3 mL via RESPIRATORY_TRACT
  Filled 2015-07-24: qty 3
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
## 2015-07-24 NOTE — Telephone Encounter (Signed)
For some reason this form keeps getting lost. Will look for completed form today. Last given to Dr. Ronnald Ramp
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
## 2015-07-24 NOTE — ED Notes (Signed)
Pt returned from X-ray.  

## 2015-07-28 NOTE — Telephone Encounter (Signed)
[name] is following up on this. She needs the documentation back by EOD tomorrow because there has to be documentation on the chart regarding the pulse-O2. Please follow up either way with stacy

## 2015-07-29 ENCOUNTER — Ambulatory Visit (INDEPENDENT_AMBULATORY_CARE_PROVIDER_SITE_OTHER): Payer: Self-pay | Admitting: Internal Medicine

## 2015-07-29 DIAGNOSIS — R918 Other nonspecific abnormal finding of lung field: Secondary | ICD-10-CM | POA: Insufficient documentation

## 2015-07-29 DIAGNOSIS — R911 Solitary pulmonary nodule: Secondary | ICD-10-CM

## 2015-07-29 DIAGNOSIS — J301 Allergic rhinitis due to pollen: Secondary | ICD-10-CM

## 2015-07-29 NOTE — Telephone Encounter (Signed)
Pt ns for OV today, unable to get more info
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
## 2015-07-31 ENCOUNTER — Encounter: Payer: Medicare Other | Admitting: Cardiovascular Disease

## 2015-08-02 ENCOUNTER — Other Ambulatory Visit: Payer: Self-pay | Admitting: Internal Medicine

## 2015-08-04 ENCOUNTER — Telehealth: Payer: Self-pay | Admitting: Internal Medicine

## 2015-08-04 NOTE — Telephone Encounter (Signed)
Stacy from [facility] called regarding patients Pulse O2 Please call her at [phone]

## 2015-08-05 ENCOUNTER — Other Ambulatory Visit: Payer: Self-pay | Admitting: Internal Medicine

## 2015-08-05 ENCOUNTER — Encounter: Payer: Self-pay | Admitting: Cardiovascular Disease

## 2015-08-05 DIAGNOSIS — F418 Other specified anxiety disorders: Secondary | ICD-10-CM

## 2015-08-05 MED ORDER — QUETIAPINE FUMARATE ER 50 MG PO TB24: 50.0000 mg | ORAL_TABLET | Freq: Every day | ORAL | Status: DC

## 2015-08-05 NOTE — Telephone Encounter (Signed)
[facility] on [address] is requesting refill for QUEtiapine (SEROQUEL XR) 50 MG TB24 24 hr tablet [[id number]]

## 2015-08-05 NOTE — Telephone Encounter (Signed)
Ok to fill pended med?

## 2015-08-06 ENCOUNTER — Other Ambulatory Visit: Payer: Self-pay | Admitting: Internal Medicine

## 2015-08-06 ENCOUNTER — Encounter: Payer: Self-pay | Admitting: Internal Medicine

## 2015-08-06 ENCOUNTER — Ambulatory Visit (INDEPENDENT_AMBULATORY_CARE_PROVIDER_SITE_OTHER)
Admission: RE | Admit: 2015-08-06 | Discharge: 2015-08-06 | Disposition: A | Discharge status: Home / Self Care | Payer: Medicare Other | Source: Ambulatory Visit | Attending: Internal Medicine | Admitting: Internal Medicine

## 2015-08-06 DIAGNOSIS — R911 Solitary pulmonary nodule: Secondary | ICD-10-CM | POA: Diagnosis not present

## 2015-08-06 DIAGNOSIS — R918 Other nonspecific abnormal finding of lung field: Secondary | ICD-10-CM

## 2015-08-06 MED ORDER — IOHEXOL 300 MG/ML  SOLN
80.0000 mL | Freq: Once | INTRAMUSCULAR | Status: AC | PRN
  Administered 2015-08-06: 80 mL via INTRAVENOUS

## 2015-08-06 NOTE — Telephone Encounter (Signed)
Saw pt email and called him back to see what questions that he had. He was worried about what abnormal ment. I assured that PCP will explain in full. The node was seen in xray and in CT and that further evaluation may be needed. Was not able to give additional information to pt.   CT scan called [facility]) and Jonelle Sidle answered call. CT wanted PCP to see the scan.   Looking at the note PCP has already seen the scan and emailed the pt to come in for the appt. Appt is set for 08/10/15.

## 2015-08-06 NOTE — Telephone Encounter (Signed)
Spoke to [place] and she stated that she needs another order signed because pt did not see PCP within 30 days of the last testing. Another test is needed and notation below for the 08/10/15 OV to be added for that visit to count.  OV notation needed: Patient would benefit from nocturnal oxygen and he currently qualifies due to the last nocturnal O2 sat was 67%.

## 2015-08-10 ENCOUNTER — Encounter: Payer: Self-pay | Admitting: Internal Medicine

## 2015-08-10 ENCOUNTER — Ambulatory Visit (INDEPENDENT_AMBULATORY_CARE_PROVIDER_SITE_OTHER): Payer: Medicare Other | Admitting: Internal Medicine

## 2015-08-10 VITALS — BP 122/80 | HR 76 | Temp 98.1°F | Resp 16 | Ht 75.0 in | Wt 186.0 lb

## 2015-08-10 DIAGNOSIS — M545 Low back pain, unspecified: Secondary | ICD-10-CM

## 2015-08-10 DIAGNOSIS — R739 Hyperglycemia, unspecified: Secondary | ICD-10-CM

## 2015-08-10 DIAGNOSIS — E785 Hyperlipidemia, unspecified: Secondary | ICD-10-CM | POA: Diagnosis not present

## 2015-08-10 DIAGNOSIS — I10 Essential (primary) hypertension: Secondary | ICD-10-CM | POA: Diagnosis not present

## 2015-08-10 DIAGNOSIS — G8929 Other chronic pain: Secondary | ICD-10-CM

## 2015-08-10 DIAGNOSIS — M170 Bilateral primary osteoarthritis of knee: Secondary | ICD-10-CM

## 2015-08-10 MED ORDER — HYDROCODONE-ACETAMINOPHEN 5-325 MG PO TABS: 1.0000 | ORAL_TABLET | Freq: Four times a day (QID) | ORAL | Status: DC | PRN

## 2015-08-10 NOTE — Progress Notes (Signed)
Pre visit review using our clinic review tool, if applicable. No additional management support is needed unless otherwise documented below in the visit note. 

## 2015-08-10 NOTE — Telephone Encounter (Signed)
[name] is following up in regards.  She is requesting call back.

## 2015-08-10 NOTE — Progress Notes (Addendum)
Subjective:  Patient ID: [name], male    DOB: [birth year]  Age: 54 y.o. MRN: [id number]  CC: Osteoarthritis; Hyperlipidemia; and Hypertension   HPI OLEGARIO EMBERSON presents for a follow-up on arthritis pain and hypertension. He is also here to discuss the recent results of the CT scan that showed a suspicious appearing mass in his right lung that is spiculated. He is seeing pulmonary tomorrow to consider having a PET scan and/or biopsy performed.  Since I last him he has stopped drinking and has stopped taking Xanax. He has been trying to control his arthritis pain with ibuprofen but says it has called some intestinal bleeding so he is requesting a prescription for Norco.  The patient would benefit from nocturnal O2   Outpatient Prescriptions Prior to Visit  Medication Sig Dispense Refill  . albuterol (PROVENTIL HFA;VENTOLIN HFA) 108 (90 BASE) MCG/ACT inhaler Inhale 1-2 puffs into the lungs every 6 (six) hours as needed for wheezing or shortness of breath. 1 Inhaler 3  . carvedilol (COREG) 12.5 MG tablet take 1 tablet by mouth twice a day with meals 180 tablet 3  . esomeprazole (NEXIUM) 40 MG capsule AS DIRECTED. (Patient taking differently: TAKE 1 TABLET IN THE MORNING) 30 capsule 11  . Fluticasone Furoate-Vilanterol (BREO ELLIPTA) 100-25 MCG/INH AEPB Inhale 1 puff into the lungs daily. 30 each 11  . LIVALO 2 MG TABS TAKE 1 TABLET BY MOUTH EVERY DAY. 90 tablet 1  . PARoxetine (PAXIL) 30 MG tablet TAKE 1 TABLET BY MOUTH EVERY MORNING. 90 tablet 3  . QUEtiapine (SEROQUEL XR) 50 MG TB24 24 hr tablet Take 1 tablet (50 mg total) by mouth at bedtime. 90 each 3  . SPIRIVA HANDIHALER 18 MCG inhalation capsule AS DIRECTED 30 capsule 11  . traZODone (DESYREL) 100 MG tablet take 3 tablets by mouth at bedtime 90 tablet 11  . TRUVADA 200-300 MG tablet TAKE 1 TABLET BY MOUTH DAILY. 90 tablet 1  . ibuprofen (ADVIL,MOTRIN) 200 MG tablet Take 400 mg by mouth every 6 (six) hours as needed for moderate  pain.     No facility-administered medications prior to visit.    ROS Review of Systems  Constitutional: Negative.  Negative for fever, chills, diaphoresis, appetite change and fatigue.  HENT: Negative.   Eyes: Negative.   Respiratory: Positive for shortness of breath. Negative for cough, choking, chest tightness, wheezing and stridor.   Cardiovascular: Negative.  Negative for chest pain, palpitations and leg swelling.  Gastrointestinal: Negative.  Negative for nausea, abdominal pain, diarrhea, constipation and blood in stool.  Endocrine: Negative.   Genitourinary: Negative.  Negative for difficulty urinating.  Musculoskeletal: Positive for back pain and arthralgias (knees). Negative for myalgias, joint swelling and gait problem.  Skin: Negative.  Negative for rash.  Allergic/Immunologic: Negative.   Neurological: Negative.  Negative for dizziness, syncope, speech difficulty, weakness, light-headedness, numbness and headaches.  Hematological: Negative.  Negative for adenopathy. Does not bruise/bleed easily.  Psychiatric/Behavioral: Positive for dysphoric mood. Negative for suicidal ideas, sleep disturbance, self-injury and decreased concentration. The patient is nervous/anxious.     Objective:  BP 122/80 mmHg  Pulse 76  Temp(Src) 98.1 F (36.7 C) (Oral)  Resp 16  Ht '6\' 3"'$  (1.905 m)  Wt 186 lb (84.369 kg)  BMI 23.25 kg/m2  SpO2 97%  BP Readings from Last 3 Encounters:  08/10/15 122/80  07/24/15 131/92  07/19/15 128/92    Wt Readings from Last 3 Encounters:  08/10/15 186 lb (84.369 kg)  07/24/15  188 lb (85.276 kg)  07/17/15 188 lb (85.276 kg)    Physical Exam  Constitutional: He is oriented to person, place, and time. He appears well-developed and well-nourished. No distress.  HENT:  Head: Normocephalic and atraumatic.  Mouth/Throat: Oropharynx is clear and moist. No oropharyngeal exudate.  Eyes: Conjunctivae are normal. Right eye exhibits no discharge. Left eye  exhibits no discharge. No scleral icterus.  Neck: Normal range of motion. Neck supple. No JVD present. No tracheal deviation present. No thyromegaly present.  Cardiovascular: Normal rate, regular rhythm, normal heart sounds and intact distal pulses.  Exam reveals no gallop and no friction rub.   No murmur heard. Pulmonary/Chest: Effort normal and breath sounds normal. No stridor. No respiratory distress. He has no wheezes. He has no rales. He exhibits no tenderness.  Abdominal: Soft. Bowel sounds are normal. He exhibits no distension and no mass. There is no tenderness. There is no rebound and no guarding.  Musculoskeletal: Normal range of motion. He exhibits no edema or tenderness.  Lymphadenopathy:    He has no cervical adenopathy.  Neurological: He is oriented to person, place, and time.  Skin: Skin is warm and dry. No rash noted. He is not diaphoretic. No erythema. No pallor.  Psychiatric: He has a normal mood and affect. His behavior is normal. Judgment and thought content normal.  Vitals reviewed.   Lab Results  Component Value Date   WBC 8.7 07/11/2015   HGB 17.9* 07/11/2015   HCT 50.5 07/11/2015   PLT 127* 07/11/2015   GLUCOSE 122* 07/11/2015   CHOL 208* 07/08/2014   TRIG 88.0 07/08/2014   HDL 47.60 07/08/2014   LDLDIRECT 71.2 09/29/2011   LDLCALC 143* 07/08/2014   ALT 31 07/11/2015   AST 39 07/11/2015   NA 143 07/11/2015   K 4.2 07/11/2015   CL 108 07/11/2015   CREATININE 0.75 07/11/2015   BUN 7 07/11/2015   CO2 24 07/11/2015   TSH 1.18 07/08/2014   PSA 0.69 12/05/2013   INR 0.88 07/11/2015   HGBA1C 5.6 01/22/2015   MICROALBUR 0.2 10/08/2008    Ct Chest W Contrast  08/06/2015  CLINICAL DATA:  Abnormal chest x-ray.  Pulmonary nodule. EXAM: CT CHEST WITH CONTRAST TECHNIQUE: Multidetector CT imaging of the chest was performed during intravenous contrast administration. CONTRAST:  22m OMNIPAQUE IOHEXOL 300 MG/ML  SOLN COMPARISON:  Two-view chest x-ray 07/24/2015  FINDINGS: A 9 x 12 x 8 mm spiculated nodule is noted anteriorly within the right upper lobe. This is best visualized on image 23 of series 3. Centrilobular emphysematous changes are noted. No other focal nodule is present. There is mild dependent atelectasis. Enlarged left AP window lymph nodes measure up to 9 x 14 mm on axial images. No significant right peritracheal adenopathy is present. There is no significant hilar adenopathy. There is no significant hilar adenopathy. The thoracic inlet is within normal limits. The esophagus is unremarkable. Limited imaging of the abdomen is within normal limits. The bone windows are unremarkable. IMPRESSION: 1. Spiculated pulmonary nodule confirmed within the anterior right middle lobe. This is highly worrisome for a primary lung carcinoma. 2. Enlarged AP window lymph nodes. These are nonspecific. No ipsilateral adenopathy is evident. Recommend PET scan for further evaluation of the 14 mm spiculated nodule and hilar lymph nodes. 3. Emphysema. Electronically Signed   By: CSan MorelleM.D.   On: 08/06/2015 12:21    Assessment & Plan:   RMelodywas seen today for osteoarthritis, hyperlipidemia and hypertension.  Diagnoses and  all orders for this visit:  HYPERTENSION, BENIGN-  His blood pressure is well-controlled, will monitor his lites and renal function. -     Basic metabolic panel; Future  Hyperglycemia-  He has prediabetes, I will recheck his A1c to see if he has developed diabetes and needs a medication. -     Lipid panel; Future -     Hemoglobin A1c; Future -     Basic metabolic panel; Future  Hyperlipidemia with target LDL less than 130-  He is doing well on the statin, I will recheck his fasting lipid panel to see if he is achieved his LDL goal. -     Lipid panel; Future -     TSH; Future  Chronic low back pain without sciatica, unspecified back pain laterality-  He reports complications from taking an anti-inflammatory so will offer him Norco  for pain relief. -     HYDROcodone-acetaminophen (NORCO/VICODIN) 5-325 MG tablet; Take 1 tablet by mouth every 6 (six) hours as needed for moderate pain.  Primary osteoarthritis of both knees -     HYDROcodone-acetaminophen (NORCO/VICODIN) 5-325 MG tablet; Take 1 tablet by mouth every 6 (six) hours as needed for moderate pain.   I have discontinued Mr. Umar ibuprofen. I am also having him start on HYDROcodone-acetaminophen. Additionally, I am having him maintain his traZODone, carvedilol, Fluticasone Furoate-Vilanterol, SPIRIVA HANDIHALER, esomeprazole, LIVALO, albuterol, TRUVADA, PARoxetine, and QUEtiapine.  Meds ordered this encounter  Medications  . HYDROcodone-acetaminophen (NORCO/VICODIN) 5-325 MG tablet    Sig: Take 1 tablet by mouth every 6 (six) hours as needed for moderate pain.    Dispense:  90 tablet    Refill:  0     Follow-up: Return in about 6 months (around 02/07/2016).  Scarlette Calico, MD

## 2015-08-10 NOTE — Patient Instructions (Signed)
Hypertension Hypertension, commonly called high blood pressure, is when the force of blood pumping through your arteries is too strong. Your arteries are the blood vessels that carry blood from your heart throughout your body. A blood pressure reading consists of a higher number over a lower number, such as 110/72. The higher number (systolic) is the pressure inside your arteries when your heart pumps. The lower number (diastolic) is the pressure inside your arteries when your heart relaxes. Ideally you want your blood pressure below 120/80. Hypertension forces your heart to work harder to pump blood. Your arteries may become narrow or stiff. Having untreated or uncontrolled hypertension can cause heart attack, stroke, kidney disease, and other problems. RISK FACTORS Some risk factors for high blood pressure are controllable. Others are not.  Risk factors you cannot control include:   Race. You may be at higher risk if you are African American.  Age. Risk increases with age.  Gender. Men are at higher risk than women before age 45 years. After age 65, women are at higher risk than men. Risk factors you can control include:  Not getting enough exercise or physical activity.  Being overweight.  Getting too much fat, sugar, calories, or salt in your diet.  Drinking too much alcohol. SIGNS AND SYMPTOMS Hypertension does not usually cause signs or symptoms. Extremely high blood pressure (hypertensive crisis) may cause headache, anxiety, shortness of breath, and nosebleed. DIAGNOSIS To check if you have hypertension, your health care provider will measure your blood pressure while you are seated, with your arm held at the level of your heart. It should be measured at least twice using the same arm. Certain conditions can cause a difference in blood pressure between your right and left arms. A blood pressure reading that is higher than normal on one occasion does not mean that you need treatment. If  it is not clear whether you have high blood pressure, you may be asked to return on a different day to have your blood pressure checked again. Or, you may be asked to monitor your blood pressure at home for 1 or more weeks. TREATMENT Treating high blood pressure includes making lifestyle changes and possibly taking medicine. Living a healthy lifestyle can help lower high blood pressure. You may need to change some of your habits. Lifestyle changes may include:  Following the DASH diet. This diet is high in fruits, vegetables, and whole grains. It is low in salt, red meat, and added sugars.  Keep your sodium intake below 2,300 mg per day.  Getting at least 30-45 minutes of aerobic exercise at least 4 times per week.  Losing weight if necessary.  Not smoking.  Limiting alcoholic beverages.  Learning ways to reduce stress. Your health care provider may prescribe medicine if lifestyle changes are not enough to get your blood pressure under control, and if one of the following is true:  You are 18-59 years of age and your systolic blood pressure is above 140.  You are 60 years of age or older, and your systolic blood pressure is above 150.  Your diastolic blood pressure is above 90.  You have diabetes, and your systolic blood pressure is over 140 or your diastolic blood pressure is over 90.  You have kidney disease and your blood pressure is above 140/90.  You have heart disease and your blood pressure is above 140/90. Your personal target blood pressure may vary depending on your medical conditions, your age, and other factors. HOME CARE INSTRUCTIONS    Have your blood pressure rechecked as directed by your health care provider.   Take medicines only as directed by your health care provider. Follow the directions carefully. Blood pressure medicines must be taken as prescribed. The medicine does not work as well when you skip doses. Skipping doses also puts you at risk for  problems.  Do not smoke.   Monitor your blood pressure at home as directed by your health care provider. SEEK MEDICAL CARE IF:   You think you are having a reaction to medicines taken.  You have recurrent headaches or feel dizzy.  You have swelling in your ankles.  You have trouble with your vision. SEEK IMMEDIATE MEDICAL CARE IF:  You develop a severe headache or confusion.  You have unusual weakness, numbness, or feel faint.  You have severe chest or abdominal pain.  You vomit repeatedly.  You have trouble breathing. MAKE SURE YOU:   Understand these instructions.  Will watch your condition.  Will get help right away if you are not doing well or get worse.   This information is not intended to replace advice given to you by your health care provider. Make sure you discuss any questions you have with your health care provider.   Document Released: 09/12/2005 Document Revised: 01/27/2015 Document Reviewed: 07/05/2013 Elsevier Interactive Patient Education 2016 [employer]  

## 2015-08-11 ENCOUNTER — Other Ambulatory Visit (INDEPENDENT_AMBULATORY_CARE_PROVIDER_SITE_OTHER): Payer: Medicare Other

## 2015-08-11 ENCOUNTER — Encounter: Payer: Self-pay | Admitting: Internal Medicine

## 2015-08-11 ENCOUNTER — Ambulatory Visit (INDEPENDENT_AMBULATORY_CARE_PROVIDER_SITE_OTHER): Payer: Medicare Other | Admitting: Internal Medicine

## 2015-08-11 VITALS — BP 122/78 | HR 77 | Ht 75.0 in | Wt 188.4 lb

## 2015-08-11 DIAGNOSIS — I10 Essential (primary) hypertension: Secondary | ICD-10-CM

## 2015-08-11 DIAGNOSIS — R911 Solitary pulmonary nodule: Secondary | ICD-10-CM | POA: Diagnosis not present

## 2015-08-11 DIAGNOSIS — E785 Hyperlipidemia, unspecified: Secondary | ICD-10-CM | POA: Diagnosis not present

## 2015-08-11 DIAGNOSIS — Z720 Tobacco use: Secondary | ICD-10-CM | POA: Diagnosis not present

## 2015-08-11 DIAGNOSIS — J449 Chronic obstructive pulmonary disease, unspecified: Secondary | ICD-10-CM

## 2015-08-11 DIAGNOSIS — F17210 Nicotine dependence, cigarettes, uncomplicated: Secondary | ICD-10-CM | POA: Diagnosis not present

## 2015-08-11 DIAGNOSIS — R739 Hyperglycemia, unspecified: Secondary | ICD-10-CM | POA: Diagnosis not present

## 2015-08-11 LAB — BASIC METABOLIC PANEL
BUN: 10 mg/dL (ref 6–23)
CHLORIDE: 109 meq/L (ref 96–112)
CO2: 26 meq/L (ref 19–32)
CREATININE: 0.81 mg/dL (ref 0.40–1.50)
Calcium: 9.1 mg/dL (ref 8.4–10.5)
GFR: 126.99 mL/min (ref >60.00)
Glucose, Bld: 94 mg/dL (ref 70–99)
POTASSIUM: 4.2 meq/L (ref 3.5–5.1)
SODIUM: 141 meq/L (ref 135–145)

## 2015-08-11 LAB — TSH: TSH: 1.14 u[IU]/mL (ref 0.35–4.50)

## 2015-08-11 LAB — LIPID PANEL
CHOLESTEROL: 134 mg/dL (ref 0–200)
HDL: 38.9 mg/dL — ABNORMAL LOW (ref >39.00)
LDL CALC: 79 mg/dL (ref 0–99)
NonHDL: 94.89
TRIGLYCERIDES: 78 mg/dL (ref 0.0–149.0)
Total CHOL/HDL Ratio: 3
VLDL: 15.6 mg/dL (ref 0.0–40.0)

## 2015-08-11 LAB — HEMOGLOBIN A1C: HEMOGLOBIN A1C: 5.6 % (ref 4.6–6.5)

## 2015-08-11 MED ORDER — MOMETASONE FURO-FORMOTEROL FUM 100-5 MCG/ACT IN AERO: INHALATION_SPRAY | RESPIRATORY_TRACT | Status: DC

## 2015-08-11 NOTE — Patient Instructions (Signed)
The key is to stop smoking completely before smoking completely stops you!  Start dulera 100 Take 2 puffs first thing in am and then another 2 puffs about 12 hours later.     Only use your albuterol (proair) as a rescue medication to be used if you can't catch your breath by resting or doing a relaxed purse lip breathing pattern.  - The less you use it, the better it will work when you need it. - Ok to use up to 2 puffs  every 4 hours if you must but call for immediate appointment if use goes up over your usual need - Don't leave home without it !!  (think of it like the spare tire for your car)   Please see patient coordinator before you leave today  to schedule a PET scan
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
## 2015-08-11 NOTE — Telephone Encounter (Signed)
Called Stacy back and resent orders and OV notes. Marzetta Board will let me know if she doesn't receive. They will schedule the rest with the pt directly. Verbal given for 2L/min and oxygen concentrator nocturnal with nasal canual. The order form had some prechecked options so that is the reason for the verbal.

## 2015-08-12 ENCOUNTER — Telehealth: Payer: Self-pay | Admitting: Internal Medicine

## 2015-08-12 NOTE — Telephone Encounter (Signed)
[name] is requesting an addendum if it appropriate.  If nocturnal o2 was talked about during the visit, could you add that "patient would benefit from nocturnal O2".   Let me know if there are any questions or if I can be of any assistance.

## 2015-08-12 NOTE — Assessment & Plan Note (Addendum)
-   pft's 10/28/09 FEV1 3.16 (70%) ratio 73 with air trapping and dloc 49% corrects to 69% for alv volume 08/11/2015  extensive coaching HFA effectiveness =    75% > try off dpi and on dulera 100 2bid  Symptoms are markedly disproportionate to objective findings and not clear this is a lung problem but pt does appear to have difficult airway management issues. DDX of  difficult airways management all start with A and  include Adherence, Ace Inhibitors, Acid Reflux, Active Sinus Disease, Alpha 1 Antitripsin deficiency, Anxiety masquerading as Airways dz,  ABPA,  allergy(esp in young), Aspiration (esp in elderly), Adverse effects of meds,  Active smokers, A bunch of PE's (a small clot burden can't cause this syndrome unless there is already severe underlying pulm or vascular dz with poor reserve) plus two Bs  = Bronchiectasis and Beta blocker use..and one C= CHF   Adherence is always the initial "prime suspect" and is a multilayered concern that requires a "trust but verify" approach in every patient - starting with knowing how to use medications, especially inhalers, correctly, keeping up with refills and understanding the fundamental difference between maintenance and prns vs those medications only taken for a very short course and then stopped and not refilled.  - The proper method of use, as well as anticipated side effects, of a metered-dose inhaler are discussed and demonstrated to the patient. Improved effectiveness after extensive coaching during this visit to a level of approximately  75% so try dulera 100 2bid for now   Active smoking > see sep a/p  ? Anxiety > usually at the bottom of this list of usual suspects but should be much higher on this pt's based on H and P and note already on psychotropics .   ? Acid (or non-acid) GERD > always difficult to exclude as up to 75% of pts in some series report no assoc GI/ Heartburn symptoms> rec continue max (24h)  acid suppression and diet restrictions/  reviewed    ? Adverse effect of dpi > change to hfa    ? BB > may need to consider more selective BB here though not impressed with significant asthmatic component today so ok for now  I had an extended discussion with the patient reviewing all relevant studies completed to date and  lasting 35 minutes of a 60 minute visit    Each maintenance medication was reviewed in detail including most importantly the difference between maintenance and prns and under what circumstances the prns are to be triggered using an action plan format that is not reflected in the computer generated alphabetically organized AVS.    Please see instructions for details which were reviewed in writing and the patient given a copy highlighting the part that I personally wrote and discussed at today's ov.

## 2015-08-12 NOTE — Telephone Encounter (Signed)
[name] is calling to follow up on addendum for chart notes faxed to her. This is regarding the over night pulse o2 done.

## 2015-08-12 NOTE — Assessment & Plan Note (Signed)
I agree with radiology that this is very suspicious for a primary bronchogenic neoplasm and note also he has some adenopathy which may make him unresectable and if so we could consider doing an EBUS/ navigational fob  but if the PET scan shows stage I disease he should be resectable for cure.  Therefore next step  is a PET scan.  Discussed in detail all the  indications, usual  risks and alternatives  relative to the benefits with patient who agrees to proceed with w/u  as outlined

## 2015-08-12 NOTE — Assessment & Plan Note (Signed)
>   3 min discussion  I emphasized that although we never turn away smokers from the pulmonary clinic, we do ask that they understand that the recommendations that we make  won't work nearly as well in the presence of continued cigarette exposure.  In fact, we may very well  reach a point where we can't promise to help the patient if he/she can't quit smoking. (We can and will promise to try to help, we just can't promise what we recommend will really work)   Will need repeat pfts as has continued to smoke since last pfts 5 y ago

## 2015-08-16 ENCOUNTER — Encounter: Payer: Self-pay | Admitting: Internal Medicine

## 2015-08-17 DIAGNOSIS — J449 Chronic obstructive pulmonary disease, unspecified: Secondary | ICD-10-CM | POA: Diagnosis not present

## 2015-08-17 NOTE — Telephone Encounter (Signed)
This has been added

## 2015-08-17 NOTE — Telephone Encounter (Signed)
Spoke to [place]. Faxed the addended OV note to her.

## 2015-08-19 DIAGNOSIS — J449 Chronic obstructive pulmonary disease, unspecified: Secondary | ICD-10-CM | POA: Diagnosis not present

## 2015-08-19 NOTE — Telephone Encounter (Signed)
Spoke with [name] at [facility] about incoming results They are going to redo O2 test. Will fax further info as it comes in

## 2015-08-21 ENCOUNTER — Encounter (HOSPITAL_COMMUNITY)
Admission: RE | Admit: 2015-08-21 | Discharge: 2015-08-21 | Disposition: A | Discharge status: Home / Self Care | Payer: Medicare Other | Source: Ambulatory Visit | Attending: Internal Medicine | Admitting: Internal Medicine

## 2015-08-21 DIAGNOSIS — R911 Solitary pulmonary nodule: Secondary | ICD-10-CM

## 2015-08-21 LAB — GLUCOSE, CAPILLARY: GLUCOSE-CAPILLARY: 103 mg/dL — AB (ref 65–99)

## 2015-08-21 MED ORDER — FLUDEOXYGLUCOSE F - 18 (FDG) INJECTION: 9.2000 | Freq: Once | INTRAVENOUS | Status: DC | PRN

## 2015-08-23 ENCOUNTER — Encounter: Payer: Self-pay | Admitting: Internal Medicine

## 2015-08-24 ENCOUNTER — Encounter: Payer: Self-pay | Admitting: Internal Medicine

## 2015-08-24 ENCOUNTER — Telehealth: Payer: Self-pay | Admitting: Internal Medicine

## 2015-08-24 ENCOUNTER — Other Ambulatory Visit: Payer: Self-pay | Admitting: Internal Medicine

## 2015-08-24 DIAGNOSIS — R911 Solitary pulmonary nodule: Secondary | ICD-10-CM

## 2015-08-24 DIAGNOSIS — F200 Paranoid schizophrenia: Secondary | ICD-10-CM

## 2015-08-24 MED ORDER — QUETIAPINE FUMARATE 100 MG PO TABS: 100.0000 mg | ORAL_TABLET | Freq: Every day | ORAL | Status: DC

## 2015-08-24 NOTE — Telephone Encounter (Signed)
[phone] calling back

## 2015-08-24 NOTE — Telephone Encounter (Signed)
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

## 2015-08-24 NOTE — Telephone Encounter (Signed)
I spoke with patient about results and he verbalized understanding and had no questions Referral placed. Nothing further needed

## 2015-08-25 ENCOUNTER — Other Ambulatory Visit: Payer: Self-pay | Admitting: *Deleted

## 2015-08-25 ENCOUNTER — Encounter: Payer: Self-pay | Admitting: Thoracic Surgery (Cardiothoracic Vascular Surgery)

## 2015-08-25 ENCOUNTER — Institutional Professional Consult (permissible substitution) (INDEPENDENT_AMBULATORY_CARE_PROVIDER_SITE_OTHER): Payer: Medicare Other | Admitting: Thoracic Surgery (Cardiothoracic Vascular Surgery)

## 2015-08-25 VITALS — BP 128/84 | HR 86 | Resp 20 | Ht 75.0 in | Wt 188.0 lb

## 2015-08-25 DIAGNOSIS — F1010 Alcohol abuse, uncomplicated: Secondary | ICD-10-CM | POA: Insufficient documentation

## 2015-08-25 DIAGNOSIS — R911 Solitary pulmonary nodule: Secondary | ICD-10-CM
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
## 2015-08-26 ENCOUNTER — Encounter (HOSPITAL_COMMUNITY)
Admission: RE | Admit: 2015-08-26 | Discharge: 2015-08-26 | Disposition: A | Discharge status: Home / Self Care | Payer: Medicare Other | Source: Ambulatory Visit | Attending: Thoracic Surgery (Cardiothoracic Vascular Surgery) | Admitting: Thoracic Surgery (Cardiothoracic Vascular Surgery)

## 2015-08-26 ENCOUNTER — Ambulatory Visit (HOSPITAL_COMMUNITY)
Admission: RE | Admit: 2015-08-26 | Discharge: 2015-08-26 | Disposition: A | Discharge status: Home / Self Care | Payer: Medicare Other | Source: Ambulatory Visit | Attending: Thoracic Surgery (Cardiothoracic Vascular Surgery) | Admitting: Thoracic Surgery (Cardiothoracic Vascular Surgery)

## 2015-08-26 ENCOUNTER — Encounter (HOSPITAL_COMMUNITY): Payer: Self-pay

## 2015-08-26 VITALS — BP 147/78 | HR 84 | Temp 98.7°F | Resp 20 | Ht 75.0 in | Wt 188.0 lb

## 2015-08-26 DIAGNOSIS — R911 Solitary pulmonary nodule: Secondary | ICD-10-CM | POA: Diagnosis not present

## 2015-08-26 DIAGNOSIS — I509 Heart failure, unspecified: Secondary | ICD-10-CM | POA: Diagnosis not present

## 2015-08-26 DIAGNOSIS — I110 Hypertensive heart disease with heart failure: Secondary | ICD-10-CM | POA: Insufficient documentation

## 2015-08-26 DIAGNOSIS — Z01818 Encounter for other preprocedural examination: Secondary | ICD-10-CM | POA: Diagnosis not present

## 2015-08-26 DIAGNOSIS — R9431 Abnormal electrocardiogram [ECG] [EKG]: Secondary | ICD-10-CM | POA: Diagnosis not present

## 2015-08-26 DIAGNOSIS — J45909 Unspecified asthma, uncomplicated: Secondary | ICD-10-CM | POA: Insufficient documentation

## 2015-08-26 DIAGNOSIS — Z79899 Other long term (current) drug therapy: Secondary | ICD-10-CM | POA: Diagnosis not present

## 2015-08-26 DIAGNOSIS — Z0183 Encounter for blood typing: Secondary | ICD-10-CM | POA: Diagnosis not present

## 2015-08-26 DIAGNOSIS — F209 Schizophrenia, unspecified: Secondary | ICD-10-CM | POA: Insufficient documentation

## 2015-08-26 DIAGNOSIS — Z87891 Personal history of nicotine dependence: Secondary | ICD-10-CM | POA: Diagnosis not present

## 2015-08-26 DIAGNOSIS — Z01812 Encounter for preprocedural laboratory examination: Secondary | ICD-10-CM | POA: Insufficient documentation

## 2015-08-26 DIAGNOSIS — K219 Gastro-esophageal reflux disease without esophagitis: Secondary | ICD-10-CM | POA: Diagnosis not present

## 2015-08-26 DIAGNOSIS — E119 Type 2 diabetes mellitus without complications: Secondary | ICD-10-CM | POA: Insufficient documentation

## 2015-08-26 DIAGNOSIS — I739 Peripheral vascular disease, unspecified: Secondary | ICD-10-CM | POA: Insufficient documentation

## 2015-08-26 HISTORY — DX: Presence of dental prosthetic device (complete) (partial): Z97.2

## 2015-08-26 HISTORY — DX: Solitary pulmonary nodule: R91.1

## 2015-08-26 HISTORY — DX: Other specified postprocedural states: R11.2

## 2015-08-26 HISTORY — DX: Other specified postprocedural states: Z98.890

## 2015-08-26 HISTORY — DX: Unspecified osteoarthritis, unspecified site: M19.90

## 2015-08-26 HISTORY — DX: Pneumonia, unspecified organism: J18.9

## 2015-08-26 HISTORY — DX: Presence of spectacles and contact lenses: Z97.3

## 2015-08-26 LAB — COMPREHENSIVE METABOLIC PANEL
ALT: 24 U/L (ref 17–63)
ANION GAP: 8 (ref 5–15)
AST: 19 U/L (ref 15–41)
Albumin: 4 g/dL (ref 3.5–5.0)
Alkaline Phosphatase: 81 U/L (ref 38–126)
BUN: 8 mg/dL (ref 6–20)
CHLORIDE: 109 mmol/L (ref 101–111)
CO2: 21 mmol/L — AB (ref 22–32)
Calcium: 9.3 mg/dL (ref 8.9–10.3)
Creatinine, Ser: 0.69 mg/dL (ref 0.61–1.24)
GFR calc non Af Amer: >60 mL/min (ref >60)
Glucose, Bld: 95 mg/dL (ref 65–99)
POTASSIUM: 4 mmol/L (ref 3.5–5.1)
SODIUM: 138 mmol/L (ref 135–145)
Total Bilirubin: 0.4 mg/dL (ref 0.3–1.2)
Total Protein: 7.2 g/dL (ref 6.5–8.1)

## 2015-08-26 LAB — PULMONARY FUNCTION TEST
DL/VA % pred: 68 %
DL/VA: 3.26 ml/min/mmHg/L
DLCO UNC % PRED: 42 %
DLCO UNC: 15.29 ml/min/mmHg
FEF 25-75 PRE: 1.1 L/s
FEF 25-75 Post: 0.53 L/sec
FEF2575-%Change-Post: -51 %
FEF2575-%PRED-PRE: 32 %
FEF2575-%Pred-Post: 15 %
FEV1-%CHANGE-POST: -3 %
FEV1-%PRED-POST: 73 %
FEV1-%Pred-Pre: 76 %
FEV1-POST: 2.64 L
FEV1-Pre: 2.73 L
FEV1FVC-%CHANGE-POST: 10 %
FEV1FVC-%Pred-Pre: 69 %
FEV6-%Change-Post: -8 %
FEV6-%PRED-PRE: 95 %
FEV6-%Pred-Post: 87 %
FEV6-POST: 3.86 L
FEV6-Pre: 4.23 L
FEV6FVC-%Change-Post: 4 %
FEV6FVC-%PRED-POST: 92 %
FEV6FVC-%Pred-Pre: 87 %
FVC-%Change-Post: -12 %
FVC-%PRED-PRE: 108 %
FVC-%Pred-Post: 94 %
FVC-PRE: 4.96 L
FVC-Post: 4.32 L
POST FEV6/FVC RATIO: 89 %
PRE FEV1/FVC RATIO: 55 %
PRE FEV6/FVC RATIO: 85 %
Post FEV1/FVC ratio: 61 %
RV % PRED: 102 %
RV: 2.37 L
TLC % PRED: 88 %
TLC: 6.7 L

## 2015-08-26 LAB — URINALYSIS, ROUTINE W REFLEX MICROSCOPIC
Bilirubin Urine: NEGATIVE
GLUCOSE, UA: NEGATIVE mg/dL
HGB URINE DIPSTICK: NEGATIVE
KETONES UR: NEGATIVE mg/dL
Leukocytes, UA: NEGATIVE
Nitrite: NEGATIVE
PROTEIN: NEGATIVE mg/dL
Specific Gravity, Urine: 1.011 (ref 1.005–1.030)
pH: 5.5 (ref 5.0–8.0)

## 2015-08-26 LAB — BLOOD GAS, ARTERIAL
Acid-base deficit: 2.3 mmol/L — ABNORMAL HIGH (ref 0.0–2.0)
Bicarbonate: 21.6 mEq/L (ref 20.0–24.0)
DRAWN BY: 42180
FIO2: 0.21
O2 Saturation: 97.2 %
PATIENT TEMPERATURE: 98.6
PCO2 ART: 34.4 mmHg — AB (ref 35.0–45.0)
PH ART: 7.414 (ref 7.350–7.450)
TCO2: 22.6 mmol/L (ref 0–100)
pO2, Arterial: 98.3 mmHg (ref 80.0–100.0)

## 2015-08-26 LAB — CBC
HCT: 46.3 % (ref 39.0–52.0)
Hemoglobin: 15.7 g/dL (ref 13.0–17.0)
MCH: 31.3 pg (ref 26.0–34.0)
MCHC: 33.9 g/dL (ref 30.0–36.0)
MCV: 92.4 fL (ref 78.0–100.0)
PLATELETS: 103 10*3/uL — AB (ref 150–400)
RBC: 5.01 MIL/uL (ref 4.22–5.81)
RDW: 13.9 % (ref 11.5–15.5)
WBC: 8.1 10*3/uL (ref 4.0–10.5)

## 2015-08-26 LAB — GLUCOSE, CAPILLARY: GLUCOSE-CAPILLARY: 104 mg/dL — AB (ref 65–99)

## 2015-08-26 LAB — TYPE AND SCREEN
ABO/RH(D): O POS
Antibody Screen: NEGATIVE

## 2015-08-26 LAB — SURGICAL PCR SCREEN
MRSA, PCR: NEGATIVE
Staphylococcus aureus: NEGATIVE

## 2015-08-26 LAB — APTT: aPTT: 31 seconds (ref 24–37)

## 2015-08-26 LAB — PROTIME-INR
INR: 1.03 (ref 0.00–1.49)
Prothrombin Time: 13.7 seconds (ref 11.6–15.2)

## 2015-08-26 LAB — ABO/RH: ABO/RH(D): O POS

## 2015-08-26 MED ORDER — ALBUTEROL SULFATE (2.5 MG/3ML) 0.083% IN NEBU
2.5000 mg | INHALATION_SOLUTION | Freq: Once | RESPIRATORY_TRACT | Status: AC
  Administered 2015-08-26: 2.5 mg via RESPIRATORY_TRACT

## 2015-08-26 NOTE — Progress Notes (Signed)
Pt denies any acute cardiopulmonary issues. Pt PCP is Dr. Scarlette Calico and cardiologist is Dr. Angelena Form. Pt chart is forwarded to [place], [place], anesthesia, to review pt history.

## 2015-08-26 NOTE — Pre-Procedure Instructions (Signed)
[name]  08/26/2015      [id number] - , [place] - [place] [address] AT [facility] [address] [place] [address] Phone: [phone] Fax: [phone]    Your procedure is scheduled on Wednesday, September 02, 2015  Report to [facility] Admitting at 10:15 A.M.  Call this number if you have problems the morning of surgery:  [phone]   Remember:  Do not eat food or drink liquids after midnight Tuesday, September 01, 2015  Take these medicines the morning of surgery with A SIP OF WATER:carvedilol (COREG), esomeprazole (NEXIUM), PARoxetine (PAXIL), TRUVADA, mometasone-formoterol (DULERA), if needed: HYDROcodone-acetaminophen (NORCO/VICODIN) for pain, albuterol (PROVENTIL HFA;VENTOLIN HFA)  Inhaler for wheezing or shortness of breath ( bring inhalr in with you on day of procedure)  Stop taking Aspirin, vitamins, fish oil, and herbal medications. Do not take any NSAIDs ie: Ibuprofen, Advil, Naproxen or any medication containing Aspirin; stop now.  How to Manage Your Diabetes Before Surgery Why is it important to control my blood sugar before and after surgery?   Improving blood sugar levels before and after surgery helps healing and can limit problems.  A way of improving blood sugar control is eating a healthy diet by:  - Eating less sugar and carbohydrates  - Increasing activity/exercise  - Talk with your doctor about reaching your blood sugar goals  High blood sugars (greater than 180 mg/dL) can raise your risk of infections and slow down your recovery so you will need to focus on controlling your diabetes during the weeks before surgery.  Make sure that the doctor who takes care of your diabetes knows about your planned surgery including the date and location.  How do I manage my blood sugars before surgery?   Check your blood sugar at least 4 times a day, 2 days before surgery to make sure that they are not too  high or low.   Check your blood sugar the morning of your surgery when you wake up and every 2 hours until you get to the Short-Stay unit.  If your blood sugar is less than 70 mg/dL, you will need to treat for low blood sugar by:  Treat a low blood sugar (less than 70 mg/dL) with 1/2 cup of clear juice (cranberry or apple), 4 glucose tablets, OR glucose gel.  Recheck blood sugar in 15 minutes after treatment (to make sure it is greater than 70 mg/dL).  If blood sugar is not greater than 70 mg/dL on re-check, call [phone] for further instructions.   Report your blood sugar to the Short-Stay nurse when you get to Short-Stay.  References:  [facility], 2007 "How to Manage your Diabetes Before and After Surgery".  Do not wear jewelry, make-up or nail polish.  Do not wear lotions, powders, or perfumes.  You may not wear deodorant.  Do not shave 48 hours prior to surgery.  Men may shave face and neck.  Do not bring valuables to the hospital.  [facility] is not responsible for any belongings or valuables.  Contacts, dentures or bridgework may not be worn into surgery.  Leave your suitcase in the car.  After surgery it may be brought to your room.  For patients admitted to the hospital, discharge time will be determined by your treatment team.  Patients discharged the day of surgery will not be allowed to drive home.   Name and phone  number of your driver:   Special instructions: Shower the night before surgery and the morning of surgery with CHG.  Please read over the following fact sheets that you were given. Pain Booklet, Coughing and Deep Breathing, Blood Transfusion Information, MRSA Information and Surgical Site Infection Prevention

## 2015-08-27 NOTE — Progress Notes (Signed)
Anesthesia Chart Review:  Pt is 54 year old male scheduled for video bronchoscopy with endobronchial ultrasound, VATS, R chest wedge resection, possible R segmentectomy on 09/02/2015 with Dr. Roxan Hockey.   PCP is Dr. Scarlette Calico. Pulmonologist is Dr. Christinia Gully.   PMH includes:  CHF, HTN, DM, PVD, asthma, hepatitis B, schizophrenia, GERD, thrombocytopenia. Alcohol abuse (12 pack per day), reportedly quit drinking 07/28/15. Notes indicate "pt would benefit from nocturnal O2" but it is not clear if this has been started yet. Current smoker. BMI 23.5.  Medications include: albuterol, carvedilol, nexium, livalo, dulera, quetiapine, truvada.   Preoperative labs reviewed.  Platelets 103.   Chest x-ray 07/24/15 reviewed.  - Possible pulmonary nodule on the lateral view. Chest CT is recommended for further evaluation. - COPD/emphysema without evidence of acute cardiopulmonary disease.  EKG 08/26/15: NSR. Left axis deviation. Inferior infarct, age undetermined  TEE 10/24/08:  - 1) Lipomatous interatrial septum at the base. Also with a prominant "q-tip" sign with opacification of the LA appendage septum. 2) Low normal LV systolic function. At least mild LVH. 3) Trace MR and TR. - There was no echocardiographic evidence for valvular vegetation. - There was no echocardiographic evidence for a cardiac source of embolism.   Nuclear stress test 09/03/08:  1. LV dilatation which appears worse on the stress then rest.  2. Mild ischemia within the mid and basilar segments of the inferior wall.  3. Global hypokinesis 4. LV EF 26%  Cardiac cath 08/29/07:  - Nonischemic nondilated cardiomyopathy. EF 35%. Normal R heart hemodynamics. No evidence of renal artery stenosis.   Reviewed case with Dr. Orene Desanctis.   If no changes, I anticipate pt can proceed with surgery as scheduled.   Willeen Cass, FNP-BC [facility] Short Stay Surgical Center/Anesthesiology Phone: [phone] 08/27/2015 2:45 PM

## 2015-09-01 MED ORDER — DEXTROSE 5 % IV SOLN: 1.5000 g | INTRAVENOUS | Status: DC

## 2015-09-02 ENCOUNTER — Other Ambulatory Visit: Payer: Self-pay | Admitting: *Deleted

## 2015-09-02 ENCOUNTER — Encounter (HOSPITAL_COMMUNITY): Admission: RE | Payer: Self-pay | Source: Ambulatory Visit

## 2015-09-02 ENCOUNTER — Encounter (HOSPITAL_COMMUNITY): Payer: Self-pay | Admitting: Certified Registered Nurse Anesthetist

## 2015-09-02 ENCOUNTER — Inpatient Hospital Stay (HOSPITAL_COMMUNITY)
Admission: RE | Admit: 2015-09-02 | Payer: Medicare Other | Source: Ambulatory Visit | Admitting: Thoracic Surgery (Cardiothoracic Vascular Surgery)

## 2015-09-02 SURGERY — BRONCHOSCOPY, WITH EBUS
Anesthesia: General | Site: Chest | Laterality: Right

## 2015-09-02 MED ORDER — PROPOFOL 10 MG/ML IV BOLUS
INTRAVENOUS | Status: AC
  Filled 2015-09-02: qty 20

## 2015-09-02 MED ORDER — LIDOCAINE HCL (CARDIAC) 20 MG/ML IV SOLN
INTRAVENOUS | Status: AC
  Filled 2015-09-02: qty 5

## 2015-09-02 MED ORDER — ONDANSETRON HCL 4 MG/2ML IJ SOLN
INTRAMUSCULAR | Status: AC
  Filled 2015-09-02: qty 2

## 2015-09-02 MED ORDER — ROCURONIUM BROMIDE 50 MG/5ML IV SOLN
INTRAVENOUS | Status: AC
  Filled 2015-09-02: qty 2

## 2015-09-02 MED ORDER — ARTIFICIAL TEARS OP OINT
TOPICAL_OINTMENT | OPHTHALMIC | Status: AC
  Filled 2015-09-02: qty 3.5

## 2015-09-02 NOTE — Anesthesia Preprocedure Evaluation (Deleted)
Anesthesia Evaluation  Patient identified by MRN, date of birth, ID band Patient awake    Reviewed: Allergy & Precautions, NPO status , Patient's Chart, lab work & pertinent test results  History of Anesthesia Complications (+) PONV and history of anesthetic complications  Airway        Dental  (+) Dental Advisory Given   Pulmonary asthma , sleep apnea , COPD,  COPD inhaler, Current Smoker,           Cardiovascular hypertension, Pt. on medications and Pt. on home beta blockers + Peripheral Vascular Disease and +CHF       Neuro/Psych PSYCHIATRIC DISORDERS Schizophrenia    GI/Hepatic PUD, GERD  Medicated,  Endo/Other  diabetes  Renal/GU      Musculoskeletal  (+) Arthritis ,   Abdominal   Peds  Hematology   Anesthesia Other Findings   Reproductive/Obstetrics                             Anesthesia Physical Anesthesia Plan Anesthesia Quick Evaluation

## 2015-09-03 ENCOUNTER — Emergency Department (HOSPITAL_COMMUNITY): Payer: Medicare Other

## 2015-09-03 ENCOUNTER — Other Ambulatory Visit: Payer: Self-pay

## 2015-09-03 ENCOUNTER — Other Ambulatory Visit: Payer: Self-pay | Admitting: *Deleted

## 2015-09-03 ENCOUNTER — Telehealth: Payer: Self-pay | Admitting: Internal Medicine

## 2015-09-03 ENCOUNTER — Emergency Department (HOSPITAL_COMMUNITY)
Admission: EM | Admit: 2015-09-03 | Discharge: 2015-09-04 | Disposition: A | Discharge status: Home / Self Care | Payer: Medicare Other | Source: Home / Self Care | Attending: Emergency Medicine | Admitting: Emergency Medicine

## 2015-09-03 ENCOUNTER — Encounter (HOSPITAL_COMMUNITY): Payer: Self-pay

## 2015-09-03 DIAGNOSIS — Z95828 Presence of other vascular implants and grafts: Secondary | ICD-10-CM | POA: Diagnosis not present

## 2015-09-03 DIAGNOSIS — K219 Gastro-esophageal reflux disease without esophagitis: Secondary | ICD-10-CM

## 2015-09-03 DIAGNOSIS — F209 Schizophrenia, unspecified: Secondary | ICD-10-CM

## 2015-09-03 DIAGNOSIS — Z7951 Long term (current) use of inhaled steroids: Secondary | ICD-10-CM | POA: Diagnosis not present

## 2015-09-03 DIAGNOSIS — Z8739 Personal history of other diseases of the musculoskeletal system and connective tissue: Secondary | ICD-10-CM

## 2015-09-03 DIAGNOSIS — Z86010 Personal history of colonic polyps: Secondary | ICD-10-CM | POA: Insufficient documentation

## 2015-09-03 DIAGNOSIS — R911 Solitary pulmonary nodule: Secondary | ICD-10-CM

## 2015-09-03 DIAGNOSIS — Z9889 Other specified postprocedural states: Secondary | ICD-10-CM | POA: Insufficient documentation

## 2015-09-03 DIAGNOSIS — R0602 Shortness of breath: Secondary | ICD-10-CM | POA: Diagnosis not present

## 2015-09-03 DIAGNOSIS — J45909 Unspecified asthma, uncomplicated: Secondary | ICD-10-CM | POA: Diagnosis not present

## 2015-09-03 DIAGNOSIS — F17200 Nicotine dependence, unspecified, uncomplicated: Secondary | ICD-10-CM | POA: Diagnosis not present

## 2015-09-03 DIAGNOSIS — F419 Anxiety disorder, unspecified: Secondary | ICD-10-CM

## 2015-09-03 DIAGNOSIS — Z87438 Personal history of other diseases of male genital organs: Secondary | ICD-10-CM | POA: Insufficient documentation

## 2015-09-03 DIAGNOSIS — I509 Heart failure, unspecified: Secondary | ICD-10-CM | POA: Insufficient documentation

## 2015-09-03 DIAGNOSIS — F200 Paranoid schizophrenia: Secondary | ICD-10-CM | POA: Diagnosis not present

## 2015-09-03 DIAGNOSIS — Z862 Personal history of diseases of the blood and blood-forming organs and certain disorders involving the immune mechanism: Secondary | ICD-10-CM

## 2015-09-03 DIAGNOSIS — E1151 Type 2 diabetes mellitus with diabetic peripheral angiopathy without gangrene: Secondary | ICD-10-CM | POA: Diagnosis not present

## 2015-09-03 DIAGNOSIS — J441 Chronic obstructive pulmonary disease with (acute) exacerbation: Secondary | ICD-10-CM

## 2015-09-03 DIAGNOSIS — E876 Hypokalemia: Secondary | ICD-10-CM | POA: Diagnosis not present

## 2015-09-03 DIAGNOSIS — E785 Hyperlipidemia, unspecified: Secondary | ICD-10-CM | POA: Diagnosis not present

## 2015-09-03 DIAGNOSIS — Z973 Presence of spectacles and contact lenses: Secondary | ICD-10-CM | POA: Insufficient documentation

## 2015-09-03 DIAGNOSIS — Z79899 Other long term (current) drug therapy: Secondary | ICD-10-CM | POA: Insufficient documentation

## 2015-09-03 DIAGNOSIS — J95812 Postprocedural air leak: Secondary | ICD-10-CM | POA: Diagnosis not present

## 2015-09-03 DIAGNOSIS — F17210 Nicotine dependence, cigarettes, uncomplicated: Secondary | ICD-10-CM | POA: Insufficient documentation

## 2015-09-03 DIAGNOSIS — R000 Tachycardia, unspecified: Secondary | ICD-10-CM | POA: Insufficient documentation

## 2015-09-03 DIAGNOSIS — E119 Type 2 diabetes mellitus without complications: Secondary | ICD-10-CM | POA: Insufficient documentation

## 2015-09-03 DIAGNOSIS — Z8619 Personal history of other infectious and parasitic diseases: Secondary | ICD-10-CM

## 2015-09-03 DIAGNOSIS — J449 Chronic obstructive pulmonary disease, unspecified: Secondary | ICD-10-CM | POA: Diagnosis not present

## 2015-09-03 DIAGNOSIS — I10 Essential (primary) hypertension: Secondary | ICD-10-CM | POA: Insufficient documentation

## 2015-09-03 DIAGNOSIS — C3411 Malignant neoplasm of upper lobe, right bronchus or lung: Secondary | ICD-10-CM | POA: Diagnosis not present

## 2015-09-03 DIAGNOSIS — F1010 Alcohol abuse, uncomplicated: Secondary | ICD-10-CM | POA: Diagnosis not present

## 2015-09-03 DIAGNOSIS — K08109 Complete loss of teeth, unspecified cause, unspecified class: Secondary | ICD-10-CM | POA: Diagnosis not present

## 2015-09-03 DIAGNOSIS — Z8701 Personal history of pneumonia (recurrent): Secondary | ICD-10-CM

## 2015-09-03 DIAGNOSIS — Z8711 Personal history of peptic ulcer disease: Secondary | ICD-10-CM

## 2015-09-03 DIAGNOSIS — M1990 Unspecified osteoarthritis, unspecified site: Secondary | ICD-10-CM | POA: Diagnosis not present

## 2015-09-03 DIAGNOSIS — Z888 Allergy status to other drugs, medicaments and biological substances status: Secondary | ICD-10-CM | POA: Diagnosis not present

## 2015-09-03 DIAGNOSIS — F329 Major depressive disorder, single episode, unspecified: Secondary | ICD-10-CM | POA: Diagnosis not present

## 2015-09-03 DIAGNOSIS — B1910 Unspecified viral hepatitis B without hepatic coma: Secondary | ICD-10-CM | POA: Diagnosis not present

## 2015-09-03 DIAGNOSIS — D696 Thrombocytopenia, unspecified: Secondary | ICD-10-CM | POA: Diagnosis not present

## 2015-09-03 LAB — CBC WITH DIFFERENTIAL/PLATELET
Basophils Absolute: 0 10*3/uL (ref 0.0–0.1)
Basophils Relative: 0 %
EOS ABS: 0.1 10*3/uL (ref 0.0–0.7)
Eosinophils Relative: 1 %
HCT: 46.1 % (ref 39.0–52.0)
HEMOGLOBIN: 15.9 g/dL (ref 13.0–17.0)
LYMPHS ABS: 3.5 10*3/uL (ref 0.7–4.0)
Lymphocytes Relative: 49 %
MCH: 31.5 pg (ref 26.0–34.0)
MCHC: 34.5 g/dL (ref 30.0–36.0)
MCV: 91.3 fL (ref 78.0–100.0)
MONO ABS: 0.6 10*3/uL (ref 0.1–1.0)
MONOS PCT: 8 %
NEUTROS PCT: 42 %
Neutro Abs: 3 10*3/uL (ref 1.7–7.7)
Platelets: 116 10*3/uL — ABNORMAL LOW (ref 150–400)
RBC: 5.05 MIL/uL (ref 4.22–5.81)
RDW: 13.9 % (ref 11.5–15.5)
WBC: 7.3 10*3/uL (ref 4.0–10.5)

## 2015-09-03 LAB — I-STAT TROPONIN, ED: TROPONIN I, POC: 0.01 ng/mL (ref 0.00–0.08)

## 2015-09-03 LAB — BASIC METABOLIC PANEL
Anion gap: 14 (ref 5–15)
BUN: 7 mg/dL (ref 6–20)
CHLORIDE: 106 mmol/L (ref 101–111)
CO2: 21 mmol/L — AB (ref 22–32)
CREATININE: 0.79 mg/dL (ref 0.61–1.24)
Calcium: 8.6 mg/dL — ABNORMAL LOW (ref 8.9–10.3)
GFR calc Af Amer: >60 mL/min (ref >60)
GFR calc non Af Amer: >60 mL/min (ref >60)
Glucose, Bld: 113 mg/dL — ABNORMAL HIGH (ref 65–99)
Potassium: 3.7 mmol/L (ref 3.5–5.1)
SODIUM: 141 mmol/L (ref 135–145)

## 2015-09-03 MED ORDER — LORAZEPAM 1 MG PO TABS
1.0000 mg | ORAL_TABLET | Freq: Once | ORAL | Status: AC
  Administered 2015-09-03: 1 mg via ORAL
  Filled 2015-09-03: qty 1

## 2015-09-03 MED ORDER — IPRATROPIUM-ALBUTEROL 0.5-2.5 (3) MG/3ML IN SOLN
3.0000 mL | Freq: Once | RESPIRATORY_TRACT | Status: AC
  Administered 2015-09-03: 3 mL via RESPIRATORY_TRACT
  Filled 2015-09-03: qty 3

## 2015-09-03 NOTE — ED Notes (Signed)
Pt arrived via EMS from home c/o COPD exacerbation pt believes secondary to anxiety.  Pt states he has lung cancer surgery scheduled here tomorrow morning.  Pt states he has drank 2-40oz beers tonight.  Pt is 95% on room air, lungs clear, unlabored, doesn't wear O2 at home.

## 2015-09-03 NOTE — Progress Notes (Signed)
Pt notified of time of arrival for surgery tomorrow. Instructed pt to be here at 7:30 AM, he states he will be here at 7:15 AM. Pt also wanted to know if he had to take any of his medications in the AM. I told him to follow the instructions that he was given at his PAT appt, he states he doesn't really want to take all of them. I instructed him to take his Carvedilol and if he chooses not to take the others that it fine, he stated he would take the Carvedilol
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
## 2015-09-03 NOTE — Telephone Encounter (Signed)
Spoke with pt, states his bronch has already been rescheduled for tomorrow morning.  Nothing further needed.

## 2015-09-04 ENCOUNTER — Inpatient Hospital Stay (HOSPITAL_COMMUNITY): Payer: Medicare Other | Admitting: Emergency Medicine

## 2015-09-04 ENCOUNTER — Encounter (HOSPITAL_COMMUNITY)
Admission: RE | Disposition: A | Discharge status: Home Health | Payer: Self-pay | Source: Ambulatory Visit | Attending: Thoracic Surgery (Cardiothoracic Vascular Surgery)

## 2015-09-04 ENCOUNTER — Inpatient Hospital Stay (HOSPITAL_COMMUNITY)
Admission: RE | Admit: 2015-09-04 | Discharge: 2015-09-12 | DRG: 164 | Disposition: A | Discharge status: Home Health | Payer: Medicare Other | Source: Ambulatory Visit | Attending: Thoracic Surgery (Cardiothoracic Vascular Surgery) | Admitting: Thoracic Surgery (Cardiothoracic Vascular Surgery)

## 2015-09-04 ENCOUNTER — Other Ambulatory Visit: Payer: Self-pay | Admitting: Internal Medicine

## 2015-09-04 ENCOUNTER — Inpatient Hospital Stay (HOSPITAL_COMMUNITY): Payer: Medicare Other | Admitting: Certified Registered Nurse Anesthetist

## 2015-09-04 ENCOUNTER — Encounter (HOSPITAL_COMMUNITY): Payer: Self-pay | Admitting: *Deleted

## 2015-09-04 ENCOUNTER — Inpatient Hospital Stay (HOSPITAL_COMMUNITY): Payer: Medicare Other

## 2015-09-04 DIAGNOSIS — B1910 Unspecified viral hepatitis B without hepatic coma: Secondary | ICD-10-CM | POA: Diagnosis present

## 2015-09-04 DIAGNOSIS — F1010 Alcohol abuse, uncomplicated: Secondary | ICD-10-CM | POA: Diagnosis present

## 2015-09-04 DIAGNOSIS — J939 Pneumothorax, unspecified: Secondary | ICD-10-CM | POA: Diagnosis not present

## 2015-09-04 DIAGNOSIS — I739 Peripheral vascular disease, unspecified: Secondary | ICD-10-CM | POA: Diagnosis not present

## 2015-09-04 DIAGNOSIS — C3490 Malignant neoplasm of unspecified part of unspecified bronchus or lung: Secondary | ICD-10-CM

## 2015-09-04 DIAGNOSIS — Z9889 Other specified postprocedural states: Secondary | ICD-10-CM | POA: Diagnosis not present

## 2015-09-04 DIAGNOSIS — Z9689 Presence of other specified functional implants: Secondary | ICD-10-CM

## 2015-09-04 DIAGNOSIS — J449 Chronic obstructive pulmonary disease, unspecified: Secondary | ICD-10-CM | POA: Diagnosis present

## 2015-09-04 DIAGNOSIS — R000 Tachycardia, unspecified: Secondary | ICD-10-CM | POA: Diagnosis not present

## 2015-09-04 DIAGNOSIS — K219 Gastro-esophageal reflux disease without esophagitis: Secondary | ICD-10-CM | POA: Diagnosis not present

## 2015-09-04 DIAGNOSIS — Z95828 Presence of other vascular implants and grafts: Secondary | ICD-10-CM | POA: Diagnosis not present

## 2015-09-04 DIAGNOSIS — J60 Coalworker's pneumoconiosis: Secondary | ICD-10-CM | POA: Diagnosis not present

## 2015-09-04 DIAGNOSIS — Z79899 Other long term (current) drug therapy: Secondary | ICD-10-CM

## 2015-09-04 DIAGNOSIS — I509 Heart failure, unspecified: Secondary | ICD-10-CM | POA: Diagnosis present

## 2015-09-04 DIAGNOSIS — J95812 Postprocedural air leak: Secondary | ICD-10-CM | POA: Diagnosis not present

## 2015-09-04 DIAGNOSIS — M1990 Unspecified osteoarthritis, unspecified site: Secondary | ICD-10-CM | POA: Diagnosis present

## 2015-09-04 DIAGNOSIS — E1151 Type 2 diabetes mellitus with diabetic peripheral angiopathy without gangrene: Secondary | ICD-10-CM | POA: Diagnosis present

## 2015-09-04 DIAGNOSIS — E876 Hypokalemia: Secondary | ICD-10-CM | POA: Diagnosis not present

## 2015-09-04 DIAGNOSIS — C3411 Malignant neoplasm of upper lobe, right bronchus or lung: Secondary | ICD-10-CM | POA: Diagnosis not present

## 2015-09-04 DIAGNOSIS — F200 Paranoid schizophrenia: Secondary | ICD-10-CM | POA: Diagnosis not present

## 2015-09-04 DIAGNOSIS — F17200 Nicotine dependence, unspecified, uncomplicated: Secondary | ICD-10-CM | POA: Diagnosis present

## 2015-09-04 DIAGNOSIS — K08109 Complete loss of teeth, unspecified cause, unspecified class: Secondary | ICD-10-CM | POA: Diagnosis present

## 2015-09-04 DIAGNOSIS — D696 Thrombocytopenia, unspecified: Secondary | ICD-10-CM | POA: Diagnosis not present

## 2015-09-04 DIAGNOSIS — F329 Major depressive disorder, single episode, unspecified: Secondary | ICD-10-CM | POA: Diagnosis present

## 2015-09-04 DIAGNOSIS — R0602 Shortness of breath: Secondary | ICD-10-CM | POA: Diagnosis not present

## 2015-09-04 DIAGNOSIS — Z888 Allergy status to other drugs, medicaments and biological substances status: Secondary | ICD-10-CM | POA: Diagnosis not present

## 2015-09-04 DIAGNOSIS — E785 Hyperlipidemia, unspecified: Secondary | ICD-10-CM | POA: Diagnosis present

## 2015-09-04 DIAGNOSIS — Z7951 Long term (current) use of inhaled steroids: Secondary | ICD-10-CM

## 2015-09-04 DIAGNOSIS — I10 Essential (primary) hypertension: Secondary | ICD-10-CM | POA: Diagnosis present

## 2015-09-04 DIAGNOSIS — J45909 Unspecified asthma, uncomplicated: Secondary | ICD-10-CM | POA: Diagnosis present

## 2015-09-04 DIAGNOSIS — J9811 Atelectasis: Secondary | ICD-10-CM | POA: Diagnosis not present

## 2015-09-04 DIAGNOSIS — C3491 Malignant neoplasm of unspecified part of right bronchus or lung: Secondary | ICD-10-CM

## 2015-09-04 DIAGNOSIS — R911 Solitary pulmonary nodule: Secondary | ICD-10-CM | POA: Diagnosis not present

## 2015-09-04 HISTORY — DX: Malignant neoplasm of unspecified part of unspecified bronchus or lung: C34.90

## 2015-09-04 HISTORY — PX: VIDEO ASSISTED THORACOSCOPY (VATS)/WEDGE RESECTION: SHX6174

## 2015-09-04 HISTORY — PX: VIDEO BRONCHOSCOPY WITH ENDOBRONCHIAL ULTRASOUND: SHX6177

## 2015-09-04 HISTORY — PX: LOBECTOMY: SHX5089

## 2015-09-04 LAB — POCT I-STAT 7, (LYTES, BLD GAS, ICA,H+H)
ACID-BASE EXCESS: 1 mmol/L (ref 0.0–2.0)
Acid-Base Excess: 2 mmol/L (ref 0.0–2.0)
BICARBONATE: 27.5 meq/L — AB (ref 20.0–24.0)
Bicarbonate: 28.3 mEq/L — ABNORMAL HIGH (ref 20.0–24.0)
CALCIUM ION: 1.14 mmol/L (ref 1.12–1.23)
CALCIUM ION: 1.19 mmol/L (ref 1.12–1.23)
HCT: 44 % (ref 39.0–52.0)
HEMATOCRIT: 43 % (ref 39.0–52.0)
Hemoglobin: 14.6 g/dL (ref 13.0–17.0)
Hemoglobin: 15 g/dL (ref 13.0–17.0)
O2 Saturation: 97 %
O2 Saturation: 100 %
PCO2 ART: 49.8 mmHg — AB (ref 35.0–45.0)
PH ART: 7.364 (ref 7.350–7.450)
POTASSIUM: 4.2 mmol/L (ref 3.5–5.1)
POTASSIUM: 4.2 mmol/L (ref 3.5–5.1)
Patient temperature: 36.6
SODIUM: 141 mmol/L (ref 135–145)
SODIUM: 139 mmol/L (ref 135–145)
TCO2: 29 mmol/L (ref 0–100)
TCO2: 30 mmol/L (ref 0–100)
pCO2 arterial: 47.5 mmHg — ABNORMAL HIGH (ref 35.0–45.0)
pH, Arterial: 7.361 (ref 7.350–7.450)
pO2, Arterial: 85 mmHg (ref 80.0–100.0)
pO2, Arterial: 205 mmHg — ABNORMAL HIGH (ref 80.0–100.0)

## 2015-09-04 LAB — GLUCOSE, CAPILLARY
GLUCOSE-CAPILLARY: 145 mg/dL — AB (ref 65–99)
GLUCOSE-CAPILLARY: 142 mg/dL — AB (ref 65–99)
GLUCOSE-CAPILLARY: 133 mg/dL — AB (ref 65–99)

## 2015-09-04 LAB — I-STAT TROPONIN, ED: TROPONIN I, POC: 0.01 ng/mL (ref 0.00–0.08)

## 2015-09-04 SURGERY — BRONCHOSCOPY, WITH EBUS
Anesthesia: General | Site: Chest | Laterality: Right

## 2015-09-04 MED ORDER — NALOXONE HCL 0.4 MG/ML IJ SOLN: 0.4000 mg | INTRAMUSCULAR | Status: DC | PRN

## 2015-09-04 MED ORDER — DEXTROSE 5 % IV SOLN
INTRAVENOUS | Status: AC
  Filled 2015-09-04: qty 1.5

## 2015-09-04 MED ORDER — ONDANSETRON HCL 4 MG/2ML IJ SOLN
INTRAMUSCULAR | Status: AC
  Filled 2015-09-04: qty 2

## 2015-09-04 MED ORDER — POTASSIUM CHLORIDE 10 MEQ/50ML IV SOLN
10.0000 meq | Freq: Every day | INTRAVENOUS | Status: DC | PRN
  Administered 2015-09-07: 10 meq via INTRAVENOUS
  Filled 2015-09-04: qty 50

## 2015-09-04 MED ORDER — VECURONIUM BROMIDE 10 MG IV SOLR
INTRAVENOUS | Status: DC | PRN
  Administered 2015-09-04: 4 mg via INTRAVENOUS
  Administered 2015-09-04: 3 mg via INTRAVENOUS
  Administered 2015-09-04: 4 mg via INTRAVENOUS
  Administered 2015-09-04 (×4): 2 mg via INTRAVENOUS
  Administered 2015-09-04: 1 mg via INTRAVENOUS
  Administered 2015-09-04 (×2): 3 mg via INTRAVENOUS
  Administered 2015-09-04: 2 mg via INTRAVENOUS
  Administered 2015-09-04: 4 mg via INTRAVENOUS
  Administered 2015-09-04 (×2): 2 mg via INTRAVENOUS

## 2015-09-04 MED ORDER — DEXTROSE 5 % IV SOLN
1.5000 g | INTRAVENOUS | Status: AC
  Administered 2015-09-04 (×2): 1.5 g via INTRAVENOUS

## 2015-09-04 MED ORDER — LIDOCAINE HCL (CARDIAC) 20 MG/ML IV SOLN
INTRAVENOUS | Status: AC
  Filled 2015-09-04: qty 10

## 2015-09-04 MED ORDER — PROPOFOL 10 MG/ML IV BOLUS
INTRAVENOUS | Status: DC | PRN
  Administered 2015-09-04: 200 mg via INTRAVENOUS

## 2015-09-04 MED ORDER — FENTANYL CITRATE (PF) 250 MCG/5ML IJ SOLN
INTRAMUSCULAR | Status: AC
  Filled 2015-09-04: qty 5

## 2015-09-04 MED ORDER — HYDROMORPHONE HCL 1 MG/ML IJ SOLN
0.2500 mg | INTRAMUSCULAR | Status: DC | PRN
  Administered 2015-09-04 (×4): 0.5 mg via INTRAVENOUS

## 2015-09-04 MED ORDER — HYDROMORPHONE HCL 1 MG/ML IJ SOLN
INTRAMUSCULAR | Status: AC
  Filled 2015-09-04: qty 1

## 2015-09-04 MED ORDER — OXYCODONE HCL 5 MG PO TABS: 5.0000 mg | ORAL_TABLET | Freq: Once | ORAL | Status: DC | PRN

## 2015-09-04 MED ORDER — LACTATED RINGERS IV SOLN
INTRAVENOUS | Status: DC | PRN
  Administered 2015-09-04 (×2): via INTRAVENOUS

## 2015-09-04 MED ORDER — 0.9 % SODIUM CHLORIDE (POUR BTL) OPTIME
TOPICAL | Status: DC | PRN
  Administered 2015-09-04: 2000 mL

## 2015-09-04 MED ORDER — BUPIVACAINE HCL (PF) 0.5 % IJ SOLN
INTRAMUSCULAR | Status: AC
  Filled 2015-09-04: qty 10

## 2015-09-04 MED ORDER — ACETAMINOPHEN 325 MG PO TABS: 325.0000 mg | ORAL_TABLET | ORAL | Status: DC | PRN

## 2015-09-04 MED ORDER — EPHEDRINE SULFATE 50 MG/ML IJ SOLN
INTRAMUSCULAR | Status: AC
  Filled 2015-09-04: qty 2

## 2015-09-04 MED ORDER — ONDANSETRON HCL 4 MG/2ML IJ SOLN: 4.0000 mg | Freq: Four times a day (QID) | INTRAMUSCULAR | Status: DC | PRN

## 2015-09-04 MED ORDER — BUPIVACAINE HCL (PF) 0.5 % IJ SOLN
INTRAMUSCULAR | Status: DC | PRN
  Administered 2015-09-04: 10 mL

## 2015-09-04 MED ORDER — STERILE WATER FOR INJECTION IJ SOLN
INTRAMUSCULAR | Status: AC
  Filled 2015-09-04: qty 20

## 2015-09-04 MED ORDER — ONDANSETRON HCL 4 MG/2ML IJ SOLN
INTRAMUSCULAR | Status: DC | PRN
  Administered 2015-09-04: 4 mg via INTRAVENOUS

## 2015-09-04 MED ORDER — ROCURONIUM BROMIDE 50 MG/5ML IV SOLN
INTRAVENOUS | Status: AC
  Filled 2015-09-04: qty 4

## 2015-09-04 MED ORDER — SUGAMMADEX SODIUM 500 MG/5ML IV SOLN
INTRAVENOUS | Status: DC | PRN
  Administered 2015-09-04: 170.6 mg via INTRAVENOUS

## 2015-09-04 MED ORDER — ACETAMINOPHEN 160 MG/5ML PO SOLN: 325.0000 mg | ORAL | Status: DC | PRN

## 2015-09-04 MED ORDER — QUETIAPINE FUMARATE 50 MG PO TABS
100.0000 mg | ORAL_TABLET | Freq: Every day | ORAL | Status: DC
  Administered 2015-09-05 – 2015-09-11 (×7): 100 mg via ORAL
  Filled 2015-09-04 (×2): qty 2
  Filled 2015-09-04: qty 1
  Filled 2015-09-04 (×5): qty 2

## 2015-09-04 MED ORDER — LORAZEPAM 2 MG/ML IJ SOLN: 0.0000 mg | Freq: Four times a day (QID) | INTRAMUSCULAR | Status: DC

## 2015-09-04 MED ORDER — ARTIFICIAL TEARS OP OINT
TOPICAL_OINTMENT | OPHTHALMIC | Status: AC
  Filled 2015-09-04: qty 3.5

## 2015-09-04 MED ORDER — ACETAMINOPHEN 500 MG PO TABS
1000.0000 mg | ORAL_TABLET | Freq: Four times a day (QID) | ORAL | Status: AC
  Administered 2015-09-04 – 2015-09-09 (×14): 1000 mg via ORAL
  Filled 2015-09-04 (×15): qty 2

## 2015-09-04 MED ORDER — LORAZEPAM 1 MG PO TABS: 0.0000 mg | ORAL_TABLET | Freq: Four times a day (QID) | ORAL | Status: DC

## 2015-09-04 MED ORDER — ALBUTEROL SULFATE (2.5 MG/3ML) 0.083% IN NEBU
2.5000 mg | INHALATION_SOLUTION | Freq: Four times a day (QID) | RESPIRATORY_TRACT | Status: DC | PRN
  Administered 2015-09-05 – 2015-09-11 (×13): 2.5 mg via RESPIRATORY_TRACT
  Filled 2015-09-04 (×15): qty 3

## 2015-09-04 MED ORDER — THIAMINE HCL 100 MG/ML IJ SOLN: 100.0000 mg | Freq: Every day | INTRAMUSCULAR | Status: DC

## 2015-09-04 MED ORDER — DIPHENHYDRAMINE HCL 50 MG/ML IJ SOLN: 12.5000 mg | Freq: Four times a day (QID) | INTRAMUSCULAR | Status: DC | PRN

## 2015-09-04 MED ORDER — EMTRICITABINE-TENOFOVIR AF 200-25 MG PO TABS
1.0000 | ORAL_TABLET | Freq: Every day | ORAL | Status: DC
  Administered 2015-09-04 – 2015-09-11 (×8): 1 via ORAL
  Filled 2015-09-04 (×10): qty 1

## 2015-09-04 MED ORDER — DEXTROSE-NACL 5-0.9 % IV SOLN
INTRAVENOUS | Status: DC
  Administered 2015-09-04 – 2015-09-05 (×2): via INTRAVENOUS

## 2015-09-04 MED ORDER — MIDAZOLAM HCL 2 MG/2ML IJ SOLN
INTRAMUSCULAR | Status: AC
  Filled 2015-09-04: qty 2

## 2015-09-04 MED ORDER — SUGAMMADEX SODIUM 500 MG/5ML IV SOLN
INTRAVENOUS | Status: AC
  Filled 2015-09-04: qty 5

## 2015-09-04 MED ORDER — ROCURONIUM BROMIDE 100 MG/10ML IV SOLN
INTRAVENOUS | Status: DC | PRN
  Administered 2015-09-04: 50 mg via INTRAVENOUS

## 2015-09-04 MED ORDER — PAROXETINE HCL 20 MG PO TABS
30.0000 mg | ORAL_TABLET | Freq: Every morning | ORAL | Status: DC
  Administered 2015-09-05 – 2015-09-12 (×8): 30 mg via ORAL
  Filled 2015-09-04 (×9): qty 2

## 2015-09-04 MED ORDER — BUPIVACAINE 0.5 % ON-Q PUMP SINGLE CATH 400 ML
400.0000 mL | INJECTION | Status: DC
  Filled 2015-09-04: qty 400

## 2015-09-04 MED ORDER — SENNOSIDES-DOCUSATE SODIUM 8.6-50 MG PO TABS
1.0000 | ORAL_TABLET | Freq: Every day | ORAL | Status: DC
  Administered 2015-09-05 – 2015-09-06 (×2): 1 via ORAL
  Filled 2015-09-04 (×3): qty 1

## 2015-09-04 MED ORDER — SODIUM CHLORIDE 0.9 % IJ SOLN
INTRAMUSCULAR | Status: AC
  Filled 2015-09-04: qty 10

## 2015-09-04 MED ORDER — ACETAMINOPHEN 160 MG/5ML PO SOLN: 1000.0000 mg | Freq: Four times a day (QID) | ORAL | Status: AC

## 2015-09-04 MED ORDER — VITAMIN B-1 100 MG PO TABS: 100.0000 mg | ORAL_TABLET | Freq: Every day | ORAL | Status: DC

## 2015-09-04 MED ORDER — DEXAMETHASONE SODIUM PHOSPHATE 10 MG/ML IJ SOLN
INTRAMUSCULAR | Status: AC
  Filled 2015-09-04: qty 1

## 2015-09-04 MED ORDER — FENTANYL 40 MCG/ML IV SOLN
INTRAVENOUS | Status: DC
  Administered 2015-09-04: 18:00:00 via INTRAVENOUS
  Administered 2015-09-04: 75 ug via INTRAVENOUS
  Administered 2015-09-05: 105 ug via INTRAVENOUS
  Administered 2015-09-05: 195 ug via INTRAVENOUS
  Administered 2015-09-05: 90 ug via INTRAVENOUS
  Administered 2015-09-05: 105 ug via INTRAVENOUS
  Administered 2015-09-05 – 2015-09-06 (×3): 60 ug via INTRAVENOUS
  Administered 2015-09-06: 75 ug via INTRAVENOUS
  Administered 2015-09-06: 90 ug via INTRAVENOUS
  Administered 2015-09-06: 07:00:00 via INTRAVENOUS
  Administered 2015-09-06: 45 ug via INTRAVENOUS
  Administered 2015-09-06 – 2015-09-07 (×2): 30 ug via INTRAVENOUS
  Administered 2015-09-07: 20 ug via INTRAVENOUS
  Administered 2015-09-07: 15 ug via INTRAVENOUS
  Administered 2015-09-07: 60 ug via INTRAVENOUS
  Administered 2015-09-07 – 2015-09-08 (×2): 15 ug via INTRAVENOUS
  Administered 2015-09-08: 60 ug via INTRAVENOUS
  Administered 2015-09-08 (×2): 0 ug via INTRAVENOUS
  Administered 2015-09-08: 30 ug via INTRAVENOUS
  Administered 2015-09-08: 45 ug via INTRAVENOUS
  Administered 2015-09-08: 15 ug via INTRAVENOUS
  Administered 2015-09-09: 0 ug via INTRAVENOUS
  Filled 2015-09-04 (×2): qty 25

## 2015-09-04 MED ORDER — LORAZEPAM 2 MG/ML IJ SOLN: 0.0000 mg | Freq: Two times a day (BID) | INTRAMUSCULAR | Status: DC

## 2015-09-04 MED ORDER — SODIUM CHLORIDE 0.9 % IJ SOLN
INTRAMUSCULAR | Status: AC
  Filled 2015-09-04: qty 30

## 2015-09-04 MED ORDER — LACTATED RINGERS IV SOLN
INTRAVENOUS | Status: DC
  Administered 2015-09-04 (×2): via INTRAVENOUS

## 2015-09-04 MED ORDER — OXYCODONE HCL 5 MG/5ML PO SOLN: 5.0000 mg | Freq: Once | ORAL | Status: DC | PRN

## 2015-09-04 MED ORDER — INFLUENZA VAC SPLIT QUAD 0.5 ML IM SUSY: 0.5000 mL | PREFILLED_SYRINGE | INTRAMUSCULAR | Status: DC | PRN

## 2015-09-04 MED ORDER — EPINEPHRINE HCL 1 MG/ML IJ SOLN
INTRAMUSCULAR | Status: DC | PRN
  Administered 2015-09-04: 1 mg

## 2015-09-04 MED ORDER — CARVEDILOL 12.5 MG PO TABS
12.5000 mg | ORAL_TABLET | Freq: Once | ORAL | Status: AC
  Administered 2015-09-04: 12.5 mg via ORAL

## 2015-09-04 MED ORDER — EPINEPHRINE HCL 1 MG/ML IJ SOLN
INTRAMUSCULAR | Status: AC
  Filled 2015-09-04: qty 1

## 2015-09-04 MED ORDER — PROPOFOL 10 MG/ML IV BOLUS
INTRAVENOUS | Status: AC
  Filled 2015-09-04: qty 20

## 2015-09-04 MED ORDER — LIDOCAINE HCL (CARDIAC) 20 MG/ML IV SOLN
INTRAVENOUS | Status: DC | PRN
  Administered 2015-09-04: 70 mg via INTRAVENOUS

## 2015-09-04 MED ORDER — SUCCINYLCHOLINE CHLORIDE 20 MG/ML IJ SOLN
INTRAMUSCULAR | Status: AC
  Filled 2015-09-04: qty 2

## 2015-09-04 MED ORDER — DEXTROSE 5 % IV SOLN
1.5000 g | INTRAVENOUS | Status: DC
  Filled 2015-09-04: qty 1.5

## 2015-09-04 MED ORDER — SODIUM CHLORIDE 0.9 % IJ SOLN: 9.0000 mL | INTRAMUSCULAR | Status: DC | PRN

## 2015-09-04 MED ORDER — DIPHENHYDRAMINE HCL 12.5 MG/5ML PO ELIX: 12.5000 mg | ORAL_SOLUTION | Freq: Four times a day (QID) | ORAL | Status: DC | PRN

## 2015-09-04 MED ORDER — FENTANYL CITRATE (PF) 100 MCG/2ML IJ SOLN
INTRAMUSCULAR | Status: DC | PRN
  Administered 2015-09-04: 50 ug via INTRAVENOUS
  Administered 2015-09-04: 100 ug via INTRAVENOUS
  Administered 2015-09-04 (×6): 50 ug via INTRAVENOUS
  Administered 2015-09-04: 100 ug via INTRAVENOUS
  Administered 2015-09-04: 50 ug via INTRAVENOUS
  Administered 2015-09-04: 200 ug via INTRAVENOUS
  Administered 2015-09-04 (×4): 50 ug via INTRAVENOUS

## 2015-09-04 MED ORDER — ALBUTEROL SULFATE HFA 108 (90 BASE) MCG/ACT IN AERS: 1.0000 | INHALATION_SPRAY | Freq: Four times a day (QID) | RESPIRATORY_TRACT | Status: DC | PRN

## 2015-09-04 MED ORDER — VECURONIUM BROMIDE 10 MG IV SOLR
INTRAVENOUS | Status: AC
  Filled 2015-09-04: qty 10

## 2015-09-04 MED ORDER — VECURONIUM BROMIDE 10 MG IV SOLR
INTRAVENOUS | Status: AC
  Filled 2015-09-04: qty 20

## 2015-09-04 MED ORDER — PANTOPRAZOLE SODIUM 40 MG PO TBEC
40.0000 mg | DELAYED_RELEASE_TABLET | Freq: Every day | ORAL | Status: DC
Start: 2015-09-05 — End: 2015-09-12
  Administered 2015-09-05 – 2015-09-12 (×8): 40 mg via ORAL
  Filled 2015-09-04 (×8): qty 1

## 2015-09-04 MED ORDER — HEMOSTATIC AGENTS (NO CHARGE) OPTIME
TOPICAL | Status: DC | PRN
  Administered 2015-09-04: 1 via TOPICAL

## 2015-09-04 MED ORDER — PHENYLEPHRINE HCL 10 MG/ML IJ SOLN
INTRAMUSCULAR | Status: DC | PRN
  Administered 2015-09-04 (×2): 80 ug via INTRAVENOUS

## 2015-09-04 MED ORDER — BISACODYL 5 MG PO TBEC
10.0000 mg | DELAYED_RELEASE_TABLET | Freq: Every day | ORAL | Status: DC
  Administered 2015-09-05 – 2015-09-07 (×3): 10 mg via ORAL
  Filled 2015-09-04 (×7): qty 2

## 2015-09-04 MED ORDER — PHENYLEPHRINE 40 MCG/ML (10ML) SYRINGE FOR IV PUSH (FOR BLOOD PRESSURE SUPPORT)
PREFILLED_SYRINGE | INTRAVENOUS | Status: AC
  Filled 2015-09-04: qty 10

## 2015-09-04 MED ORDER — STERILE WATER FOR INJECTION IJ SOLN
INTRAMUSCULAR | Status: AC
  Filled 2015-09-04: qty 10

## 2015-09-04 MED ORDER — TRAZODONE HCL 150 MG PO TABS
300.0000 mg | ORAL_TABLET | Freq: Every day | ORAL | Status: DC
  Administered 2015-09-05 – 2015-09-11 (×7): 300 mg via ORAL
  Filled 2015-09-04 (×7): qty 2

## 2015-09-04 MED ORDER — DEXAMETHASONE SODIUM PHOSPHATE 10 MG/ML IJ SOLN
INTRAMUSCULAR | Status: DC | PRN
  Administered 2015-09-04: 10 mg via INTRAVENOUS

## 2015-09-04 MED ORDER — MOMETASONE FURO-FORMOTEROL FUM 100-5 MCG/ACT IN AERO
2.0000 | INHALATION_SPRAY | Freq: Two times a day (BID) | RESPIRATORY_TRACT | Status: DC
  Administered 2015-09-05 – 2015-09-12 (×13): 2 via RESPIRATORY_TRACT
  Filled 2015-09-04 (×3): qty 8.8

## 2015-09-04 MED ORDER — BUPIVACAINE 0.5 % ON-Q PUMP SINGLE CATH 400 ML
INJECTION | Status: DC | PRN
  Administered 2015-09-04: 400 mL

## 2015-09-04 MED ORDER — LORAZEPAM 1 MG PO TABS: 0.0000 mg | ORAL_TABLET | Freq: Two times a day (BID) | ORAL | Status: DC

## 2015-09-04 MED ORDER — SODIUM CHLORIDE 0.9 % IV SOLN
10.0000 mg | INTRAVENOUS | Status: DC | PRN
  Administered 2015-09-04: 20 ug/min via INTRAVENOUS

## 2015-09-04 MED ORDER — CARVEDILOL 12.5 MG PO TABS
12.5000 mg | ORAL_TABLET | Freq: Two times a day (BID) | ORAL | Status: DC
  Administered 2015-09-05 – 2015-09-12 (×15): 12.5 mg via ORAL
  Filled 2015-09-04 (×15): qty 1

## 2015-09-04 SURGICAL SUPPLY — 97 items
APPLIER CLIP LOGIC TI 5 (MISCELLANEOUS) ×4 IMPLANT
BENZOIN TINCTURE PRP APPL 2/3 (GAUZE/BANDAGES/DRESSINGS) ×4 IMPLANT
BLADE SURG 11 STRL SS (BLADE) ×4 IMPLANT
CANISTER SUCTION 2500CC (MISCELLANEOUS) ×12 IMPLANT
CATH KIT ON Q 5IN SLV (PAIN MANAGEMENT) IMPLANT
CATH KIT ON-Q SILVERSOAK 5IN (CATHETERS) ×4 IMPLANT
CATH THORACIC 28FR (CATHETERS) IMPLANT
CATH THORACIC 36FR (CATHETERS) IMPLANT
CATH THORACIC 36FR RT ANG (CATHETERS) IMPLANT
CLIP TI MEDIUM 6 (CLIP) ×4 IMPLANT
CONN ST 1/4X3/8  BEN (MISCELLANEOUS)
CONN ST 1/4X3/8 BEN (MISCELLANEOUS) IMPLANT
CONN Y 3/8X3/8X3/8  BEN (MISCELLANEOUS)
CONN Y 3/8X3/8X3/8 BEN (MISCELLANEOUS) IMPLANT
CONT SPEC 4OZ CLIKSEAL STRL BL (MISCELLANEOUS) ×16 IMPLANT
CONT SPECI 4OZ STER CLIK (MISCELLANEOUS) ×16 IMPLANT
COVER DOME SNAP 22 D (MISCELLANEOUS) ×4 IMPLANT
COVER SURGICAL LIGHT HANDLE (MISCELLANEOUS) ×4 IMPLANT
COVER TABLE BACK 60X90 (DRAPES) ×4 IMPLANT
DERMABOND ADVANCED (GAUZE/BANDAGES/DRESSINGS) ×1
DERMABOND ADVANCED .7 DNX12 (GAUZE/BANDAGES/DRESSINGS) ×3 IMPLANT
DRAIN CHANNEL 28F RND 3/8 FF (WOUND CARE) IMPLANT
DRAIN CHANNEL 32F RND 10.7 FF (WOUND CARE) IMPLANT
DRAPE LAPAROSCOPIC ABDOMINAL (DRAPES) ×4 IMPLANT
DRAPE WARM FLUID 44X44 (DRAPE) ×4 IMPLANT
ELECT BLADE 6.5 EXT (BLADE) ×4 IMPLANT
ELECT REM PT RETURN 9FT ADLT (ELECTROSURGICAL) ×4
ELECTRODE REM PT RTRN 9FT ADLT (ELECTROSURGICAL) ×3 IMPLANT
FILTER STRAW FLUID ASPIR (MISCELLANEOUS) ×4 IMPLANT
FORCEPS BIOP RJ4 1.8 (CUTTING FORCEPS) IMPLANT
GAUZE SPONGE 4X4 12PLY STRL (GAUZE/BANDAGES/DRESSINGS) ×4 IMPLANT
GLOVE BIO SURGEON STRL SZ 6 (GLOVE) ×12 IMPLANT
GLOVE BIO SURGEON STRL SZ 6.5 (GLOVE) ×12 IMPLANT
GLOVE BIOGEL PI IND STRL 6 (GLOVE) ×9 IMPLANT
GLOVE BIOGEL PI IND STRL 6.5 (GLOVE) ×9 IMPLANT
GLOVE BIOGEL PI INDICATOR 6 (GLOVE) ×3
GLOVE BIOGEL PI INDICATOR 6.5 (GLOVE) ×3
GLOVE SURG SIGNA 7.5 PF LTX (GLOVE) ×12 IMPLANT
GOWN STRL REUS W/ TWL LRG LVL3 (GOWN DISPOSABLE) ×12 IMPLANT
GOWN STRL REUS W/ TWL XL LVL3 (GOWN DISPOSABLE) ×9 IMPLANT
GOWN STRL REUS W/TWL LRG LVL3 (GOWN DISPOSABLE) ×4
GOWN STRL REUS W/TWL XL LVL3 (GOWN DISPOSABLE) ×3
HANDLE STAPLE ENDO GIA SHORT (STAPLE)
HEMOSTAT SURGICEL 2X14 (HEMOSTASIS) ×4 IMPLANT
KIT BASIN OR (CUSTOM PROCEDURE TRAY) ×4 IMPLANT
KIT CLEAN ENDO COMPLIANCE (KITS) ×8 IMPLANT
KIT ROOM TURNOVER OR (KITS) ×8 IMPLANT
KIT SUCTION CATH 14FR (SUCTIONS) ×4 IMPLANT
MARKER SKIN DUAL TIP RULER LAB (MISCELLANEOUS) ×4 IMPLANT
NEEDLE EBUS SONO TIP PENTAX (NEEDLE) ×4 IMPLANT
NEEDLE SONO TIP II EBUS (NEEDLE) ×4 IMPLANT
NS IRRIG 1000ML POUR BTL (IV SOLUTION) ×16 IMPLANT
OIL SILICONE PENTAX (PARTS (SERVICE/REPAIRS)) ×4 IMPLANT
PACK CHEST (CUSTOM PROCEDURE TRAY) ×4 IMPLANT
PAD ARMBOARD 7.5X6 YLW CONV (MISCELLANEOUS) ×16 IMPLANT
POUCH ENDO CATCH II 15MM (MISCELLANEOUS) ×8 IMPLANT
POUCH SPECIMEN RETRIEVAL 10MM (ENDOMECHANICALS) ×4 IMPLANT
RELOAD STAPLER GOLD 60MM (STAPLE) ×33 IMPLANT
RELOAD STAPLER GREEN 60MM (STAPLE) ×12 IMPLANT
SCISSORS LAP 5X35 DISP (ENDOMECHANICALS) ×4 IMPLANT
SEALANT PROGEL (MISCELLANEOUS) IMPLANT
SOLUTION ANTI FOG 6CC (MISCELLANEOUS) ×8 IMPLANT
SPECIMEN JAR MEDIUM (MISCELLANEOUS) ×4 IMPLANT
SPONGE GAUZE 4X4 12PLY STER LF (GAUZE/BANDAGES/DRESSINGS) ×4 IMPLANT
SPONGE INTESTINAL PEANUT (DISPOSABLE) ×12 IMPLANT
SPONGE TONSIL 1 RF SGL (DISPOSABLE) ×8 IMPLANT
STAPLE ECHEON FLEX 60 POW ENDO (STAPLE) ×4 IMPLANT
STAPLE RELOAD 2.5MM WHITE (STAPLE) ×16 IMPLANT
STAPLER ENDO GIA 12MM SHORT (STAPLE) IMPLANT
STAPLER RELOAD GOLD 60MM (STAPLE) ×44
STAPLER RELOAD GREEN 60MM (STAPLE) ×16
STAPLER VASCULAR ECHELON 35 (CUTTER) ×4 IMPLANT
SUT PROLENE 4 0 RB 1 (SUTURE) ×1
SUT PROLENE 4-0 RB1 .5 CRCL 36 (SUTURE) ×3 IMPLANT
SUT SILK  1 MH (SUTURE) ×2
SUT SILK 1 MH (SUTURE) ×6 IMPLANT
SUT SILK 1 TIES 10X30 (SUTURE) ×4 IMPLANT
SUT SILK 3 0 SH 30 (SUTURE) ×4 IMPLANT
SUT VIC AB 1 CTX 27 (SUTURE) ×4 IMPLANT
SUT VIC AB 2-0 CTX 36 (SUTURE) ×4 IMPLANT
SUT VIC AB 3-0 X1 27 (SUTURE) ×4 IMPLANT
SUT VICRYL 0 UR6 27IN ABS (SUTURE) ×8 IMPLANT
SYR 20CC LL (SYRINGE) ×4 IMPLANT
SYR 20ML ECCENTRIC (SYRINGE) ×4 IMPLANT
SYR 5ML LL (SYRINGE) ×4 IMPLANT
SYR 5ML LUER SLIP (SYRINGE) ×4 IMPLANT
SYR CONTROL 10ML LL (SYRINGE) ×4 IMPLANT
SYSTEM SAHARA CHEST DRAIN ATS (WOUND CARE) ×4 IMPLANT
TIP APPLICATOR SPRAY EXTEND 16 (VASCULAR PRODUCTS) IMPLANT
TOWEL OR 17X24 6PK STRL BLUE (TOWEL DISPOSABLE) ×8 IMPLANT
TOWEL OR 17X26 10 PK STRL BLUE (TOWEL DISPOSABLE) ×8 IMPLANT
TRAP SPECIMEN MUCOUS 40CC (MISCELLANEOUS) ×4 IMPLANT
TRAY FOLEY CATH 16FRSI W/METER (SET/KITS/TRAYS/PACK) ×4 IMPLANT
TROCAR XCEL BLADELESS 5X75MML (TROCAR) ×4 IMPLANT
TUBE CONNECTING 20X1/4 (TUBING) ×4 IMPLANT
TUNNELER SHEATH ON-Q 11GX8 DSP (PAIN MANAGEMENT) ×4 IMPLANT
WATER STERILE IRR 1000ML POUR (IV SOLUTION) ×4 IMPLANT

## 2015-09-04 NOTE — Interval H&P Note (Signed)
History and Physical Interval Note:  09/04/2015 9:22 AM  [name]  has presented today for surgery, with the diagnosis of RUL NODULE  The various methods of treatment have been discussed with the patient and family. After consideration of risks, benefits and other options for treatment, the patient has consented to  Procedure(s): VIDEO BRONCHOSCOPY WITH ENDOBRONCHIAL ULTRASOUND (N/A) VIDEO ASSISTED THORACOSCOPY (VATS)/WEDGE RESECTION (Right) SEGMENTECTOMY (Right) as a surgical intervention .  The patient's history has been reviewed, patient examined, no change in status, stable for surgery.  I have reviewed the patient's chart and labs.  Questions were answered to the patient's satisfaction.     Melrose Nakayama

## 2015-09-04 NOTE — Anesthesia Preprocedure Evaluation (Addendum)
Anesthesia Evaluation  Patient identified by MRN, date of birth, ID band Patient awake    Reviewed: Allergy & Precautions, NPO status , Patient's Chart, lab work & pertinent test results, reviewed documented beta blocker date and time   History of Anesthesia Complications Negative for: history of anesthetic complications  Airway Mallampati: II  TM Distance: >3 FB Neck ROM: Full    Dental  (+) Edentulous Upper, Edentulous Lower, Dental Advidsory Given   Pulmonary shortness of breath and with exertion, asthma , sleep apnea , COPD,  COPD inhaler, Current Smoker,    breath sounds clear to auscultation       Cardiovascular hypertension, Pt. on medications and Pt. on home beta blockers + Peripheral Vascular Disease and +CHF   Rhythm:Regular     Neuro/Psych PSYCHIATRIC DISORDERS Schizophrenia negative neurological ROS     GI/Hepatic PUD, GERD  Medicated and Controlled,(+) Hepatitis -, B  Endo/Other  diabetes  Renal/GU      Musculoskeletal  (+) Arthritis ,   Abdominal   Peds  Hematology negative hematology ROS (+)   Anesthesia Other Findings   Reproductive/Obstetrics                            Anesthesia Physical Anesthesia Plan  ASA: III  Anesthesia Plan: General   Post-op Pain Management:    Induction: Intravenous  Airway Management Planned: Oral ETT and Double Lumen EBT  Additional Equipment: Arterial line, CVP and Ultrasound Guidance Line Placement  Intra-op Plan:   Post-operative Plan: Extubation in OR  Informed Consent: I have reviewed the patients History and Physical, chart, labs and discussed the procedure including the risks, benefits and alternatives for the proposed anesthesia with the patient or authorized representative who has indicated his/her understanding and acceptance.   Dental advisory given and Dental Advisory Given  Plan Discussed with: CRNA, Anesthesiologist  and Surgeon  Anesthesia Plan Comments:        Anesthesia Quick Evaluation
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
## 2015-09-04 NOTE — Anesthesia Postprocedure Evaluation (Signed)
Anesthesia Post Note  Patient: [name]  Procedure(s) Performed: Procedure(s) (LRB): VIDEO BRONCHOSCOPY WITH ENDOBRONCHIAL ULTRASOUND (N/A) VIDEO ASSISTED THORACOSCOPY (VATS)/WEDGE RESECTION (Right) LOBECTOMY (Right)  Patient location during evaluation: PACU Anesthesia Type: General Level of consciousness: awake and alert Pain management: pain level controlled Vital Signs Assessment: post-procedure vital signs reviewed and stable Respiratory status: spontaneous breathing, nonlabored ventilation, respiratory function stable and patient connected to face mask oxygen Cardiovascular status: blood pressure returned to baseline and stable Postop Assessment: no signs of nausea or vomiting Anesthetic complications: no Comments: sats 94% with facemask, HR 90 and sinus rhythm, BP 782U systolic, able to use PCA demonstrated with good response prior to departure from pacu Chest tubes Y into suction, less than 100cc output during whole pacu stay     Last Vitals:  Filed Vitals:   09/04/15 1845 09/04/15 1900  BP: 144/101   Pulse: 97 95  Temp: 36.3 C   Resp: 23 20    Last Pain:  Filed Vitals:   09/04/15 1911  PainSc: 10-Worst pain ever                 Zenaida Deed

## 2015-09-04 NOTE — ED Provider Notes (Signed)
0315 - Patient care assumed from [place], PA-C at shift change. Plan discussed which includes d/c if delta troponin negative. Labs reviewed, negative for acute findings. Trop x 2 negative. Patient to be discharged with instructions for outpatient f/u. Return precautions given. Patient discharged in satisfactory condition; VSS.   Results for orders placed or performed during the hospital encounter of 09/03/15  CBC with Differential  Result Value Ref Range   WBC 7.3 4.0 - 10.5 K/uL   RBC 5.05 4.22 - 5.81 MIL/uL   Hemoglobin 15.9 13.0 - 17.0 g/dL   HCT 46.1 39.0 - 52.0 %   MCV 91.3 78.0 - 100.0 fL   MCH 31.5 26.0 - 34.0 pg   MCHC 34.5 30.0 - 36.0 g/dL   RDW 13.9 11.5 - 15.5 %   Platelets 116 (L) 150 - 400 K/uL   Neutrophils Relative % 42 %   Neutro Abs 3.0 1.7 - 7.7 K/uL   Lymphocytes Relative 49 %   Lymphs Abs 3.5 0.7 - 4.0 K/uL   Monocytes Relative 8 %   Monocytes Absolute 0.6 0.1 - 1.0 K/uL   Eosinophils Relative 1 %   Eosinophils Absolute 0.1 0.0 - 0.7 K/uL   Basophils Relative 0 %   Basophils Absolute 0.0 0.0 - 0.1 K/uL  Basic metabolic panel  Result Value Ref Range   Sodium 141 135 - 145 mmol/L   Potassium 3.7 3.5 - 5.1 mmol/L   Chloride 106 101 - 111 mmol/L   CO2 21 (L) 22 - 32 mmol/L   Glucose, Bld 113 (H) 65 - 99 mg/dL   BUN 7 6 - 20 mg/dL   Creatinine, Ser 0.79 0.61 - 1.24 mg/dL   Calcium 8.6 (L) 8.9 - 10.3 mg/dL   GFR calc non Af Amer >60 >60 mL/min   GFR calc Af Amer >60 >60 mL/min   Anion gap 14 5 - 15  I-stat troponin, ED  Result Value Ref Range   Troponin i, poc 0.01 0.00 - 0.08 ng/mL   Comment 3          I-stat troponin, ED  Result Value Ref Range   Troponin i, poc 0.01 0.00 - 0.08 ng/mL   Comment 3           Dg Chest 2 View  09/03/2015  CLINICAL DATA:  Fifty-five canal with chest pain and shortness of breath EXAM: CHEST  2 VIEW COMPARISON:  Chest radiograph dated 07/24/2015 and CT dated 08/06/2015 FINDINGS: Two views of the chest demonstrate minimal  bibasilar atelectatic changes. There is no focal consolidation, pleural effusion, or pneumothorax. A 7 mm nodular density in the right lower lung field Corresponds to the focal nodule/scarring seen on the prior CT in the medial aspect of the right upper lobe. The cardiac silhouette is within normal limits. The osseous structures are grossly unremarkable. IMPRESSION: No active cardiopulmonary disease. Right lower lung field pulmonary nodule.  Follow-up recommended. Electronically Signed   By: Anner Crete M.D.   On: 09/03/2015 23:03       Antonietta Breach, PA-C 09/04/15 0102  Veryl Speak, MD 09/04/15 [phone]

## 2015-09-04 NOTE — OR Nursing (Signed)
Pathology notified of specimen on the way for frozen

## 2015-09-04 NOTE — Transfer of Care (Signed)
Immediate Anesthesia Transfer of Care Note  Patient: [name]  Procedure(s) Performed: Procedure(s): VIDEO BRONCHOSCOPY WITH ENDOBRONCHIAL ULTRASOUND (N/A) VIDEO ASSISTED THORACOSCOPY (VATS)/WEDGE RESECTION (Right) LOBECTOMY (Right)  Patient Location: PACU  Anesthesia Type:General  Level of Consciousness: sedated  Airway & Oxygen Therapy: Patient Spontanous Breathing and Patient connected to face mask oxygen  Post-op Assessment: Report given to RN, Post -op Vital signs reviewed and stable and Patient moving all extremities  Post vital signs: Reviewed and stable  Last Vitals:  Filed Vitals:   09/04/15 0756 09/04/15 1715  BP: 151/96   Pulse: 92   Temp: 37.1 C 36.4 C  Resp: 18     Complications: No apparent anesthesia complications

## 2015-09-04 NOTE — Anesthesia Procedure Notes (Addendum)
Procedure Name: Intubation Date/Time: 09/04/2015 9:45 AM Performed by: Neldon Newport Pre-anesthesia Checklist: Patient being monitored, Suction available, Emergency Drugs available, Patient identified and Timeout performed Patient Re-evaluated:Patient Re-evaluated prior to inductionOxygen Delivery Method: Circle system utilized Preoxygenation: Pre-oxygenation with 100% oxygen Intubation Type: IV induction Ventilation: Mask ventilation without difficulty Laryngoscope Size: Mac and 4 Grade View: Grade I Tube type: Oral Tube size: 9.0 mm Number of attempts: 1 Placement Confirmation: positive ETCO2,  ETT inserted through vocal cords under direct vision and breath sounds checked- equal and bilateral Secured at: 23 cm Tube secured with: Tape Dental Injury: Teeth and Oropharynx as per pre-operative assessment    Procedure Name: Intubation Date/Time: 09/04/2015 10:52 AM Performed by: Neldon Newport Pre-anesthesia Checklist: Patient being monitored, Suction available, Emergency Drugs available, Patient identified and Timeout performed Patient Re-evaluated:Patient Re-evaluated prior to inductionPreoxygenation: Pre-oxygenation with 100% oxygen (9.0 ETT removed.  ) Laryngoscope Size: Mac and 4 Grade View: Grade I Tube type: Oral Endobronchial tube: Left, Double lumen EBT and EBT position confirmed by fiberoptic bronchoscope and 41 Fr Number of attempts: 1 Placement Confirmation: positive ETCO2,  ETT inserted through vocal cords under direct vision and breath sounds checked- equal and bilateral Tube secured with: Tape Dental Injury: Teeth and Oropharynx as per pre-operative assessment

## 2015-09-04 NOTE — Discharge Instructions (Signed)
1. Medications: usual home medications 2. Treatment: rest, drink plenty of fluids 3. Follow Up: please followup with your primary doctor for discussion of your diagnoses and further evaluation after today's visit; if you do not have a primary care doctor use the resource guide provided to find one; please return to the ER for severe chest pain, increased shortness of breath, new or worsening symptoms   Shortness of Breath Shortness of breath means you have trouble breathing. It could also mean that you have a medical problem. You should get immediate medical care for shortness of breath. CAUSES   Not enough oxygen in the air such as with high altitudes or a smoke-filled room.  Certain lung diseases, infections, or problems.  Heart disease or conditions, such as angina or heart failure.  Low red blood cells (anemia).  Poor physical fitness, which can cause shortness of breath when you exercise.  Chest or back injuries or stiffness.  Being overweight.  Smoking.  Anxiety, which can make you feel like you are not getting enough air. DIAGNOSIS  Serious medical problems can often be found during your physical exam. Tests may also be done to determine why you are having shortness of breath. Tests may include:  Chest X-rays.  Lung function tests.  Blood tests.  An electrocardiogram (ECG).  An ambulatory electrocardiogram. An ambulatory ECG records your heartbeat patterns over a 24-hour period.  Exercise testing.  A transthoracic echocardiogram (TTE). During echocardiography, sound waves are used to evaluate how blood flows through your heart.  A transesophageal echocardiogram (TEE).  Imaging scans. Your health care provider may not be able to find a cause for your shortness of breath after your exam. In this case, it is important to have a follow-up exam with your health care provider as directed.  TREATMENT  Treatment for shortness of breath depends on the cause of your  symptoms and can vary greatly. HOME CARE INSTRUCTIONS   Do not smoke. Smoking is a common cause of shortness of breath. If you smoke, ask for help to quit.  Avoid being around chemicals or things that may bother your breathing, such as paint fumes and dust.  Rest as needed. Slowly resume your usual activities.  If medicines were prescribed, take them as directed for the full length of time directed. This includes oxygen and any inhaled medicines.  Keep all follow-up appointments as directed by your health care provider. SEEK MEDICAL CARE IF:   Your condition does not improve in the time expected.  You have a hard time doing your normal activities even with rest.  You have any new symptoms. SEEK IMMEDIATE MEDICAL CARE IF:   Your shortness of breath gets worse.  You feel light-headed, faint, or develop a cough not controlled with medicines.  You start coughing up blood.  You have pain with breathing.  You have chest pain or pain in your arms, shoulders, or abdomen.  You have a fever.  You are unable to walk up stairs or exercise the way you normally do. MAKE SURE YOU:  Understand these instructions.  Will watch your condition.  Will get help right away if you are not doing well or get worse.   This information is not intended to replace advice given to you by your health care provider. Make sure you discuss any questions you have with your health care provider.   Document Released: 06/07/2001 Document Revised: 09/17/2013 Document Reviewed: 11/28/2011 Elsevier Interactive Patient Education 2016 [employer].   Emergency Department Resource  Guide 1) Find a Doctor and Pay Out of Pocket Although you won't have to find out who is covered by your insurance plan, it is a good idea to ask around and get recommendations. You will then need to call the office and see if the doctor you have chosen will accept you as a new patient and what types of options they offer for patients  who are self-pay. Some doctors offer discounts or will set up payment plans for their patients who do not have insurance, but you will need to ask so you aren't surprised when you get to your appointment.  2) Contact Your Local Health Department Not all health departments have doctors that can see patients for sick visits, but many do, so it is worth a call to see if yours does. If you don't know where your local health department is, you can check in your phone book. The CDC also has a tool to help you locate your state's health department, and many state websites also have listings of all of their local health departments.  3) Find a [facility] If your illness is not likely to be very severe or complicated, you may want to try a walk in clinic. These are popping up all over the country in pharmacies, drugstores, and shopping centers. They're usually staffed by nurse practitioners or physician assistants that have been trained to treat common illnesses and complaints. They're usually fairly quick and inexpensive. However, if you have serious medical issues or chronic medical problems, these are probably not your best option.  No Primary Care Doctor: - Call Health Connect at  [phone] - they can help you locate a primary care doctor that  accepts your insurance, provides certain services, etc. - Physician Referral Service- [phone]  Chronic Pain Problems: Organization         Address  Phone   Notes  [facility]  [phone] Patients need to be referred by their primary care doctor.   Medication Assistance: Organization         Address  Phone   Notes  [facility] Medication [facility] [address] [place], [place] [address] [phone] --Must be a resident of [facility] -- Must have NO insurance coverage whatsoever (no Medicaid/ Medicare, etc.) -- The pt. MUST have a primary care doctor that directs their care regularly and follows  them in the community   MedAssist  [phone]   [employer]  [phone]    Agencies that provide inexpensive medical care: Organization         Address  Phone   Notes  [place]  [phone]   [facility]    [phone]   [facility] [place], [place] [address] [phone]   [place] [address] [place] [phone]   Planned Parenthood    [phone]   [facility]    [phone]   [place] and [place] [address], [place] Phone:  [phone], Fax:  [phone] Hours of Operation:  9 am - 6 pm, M-F.  Also accepts Medicaid/Medicare and self-pay.  [facility] for [place] [address], [address], [place] Phone: [phone], Fax: [phone]. Hours of Operation:  8:30 am - 5:30 pm, M-F.  Also accepts Medicaid and self-pay.  [facility] [place] [address]  [place] Phone: [phone]   [place], [place], [place] [phone], Ext. [phone] Mondays & Thursdays: 7-9 AM.  First 15 patients are seen on a first come, first serve basis.    [place] Providers:  Organization         Address  Phone   Notes  [facility] [address], [place] [phone] Also accepts self-pay patients.  [facility] [address], [place]  [phone]   [place], [address], [place] [phone]   [facility] [address] [place] [phone]   Lucianne Lei [address], [place]   [phone] Only accepts [place] Access [place] patients after they have their name applied to their card.   Self-Pay (no insurance) in [facility]:  Organization         Address  Phone   Notes  Sickle Cell  Patients, [facility] [place] [phone]   [facility] [place] [phone]   [facility] [place]  [place], [place], [place] [phone]   [facility]/Dr. Osei-Bonsu  [address] [place] or [address], [place] [phone] Phone number for both [place] and [place] locations is the same.  [facility] [address] [place] [phone]   [facility] [address] [place] or [address] [phone]   [facility] [address], [place] [phone], phone; [phone], fax Sees patients 1st and 3rd Saturday of every month.  Must not qualify for public or private insurance (i.e. Medicaid, Medicare, [place] Health Choice, Veterans' Benefits)  Household income should be no more than 200% of the poverty level The clinic cannot treat you if you are pregnant or think you are pregnant  Sexually transmitted diseases are not treated at the clinic.    Dental Care: Organization         Address  Phone  Notes  [facility] Department of [facility] [place] [phone] Accepts children up to age 24 who are enrolled in [place] or [place]; pregnant women with a Medicaid card; and children who have applied for Medicaid or [place] Health Choice, but were declined, whose parents can pay a reduced fee at time of service.  [facility]  [address], [place] [phone] Accepts children up to age 74 who are enrolled in [place] or [place]; pregnant women with a Medicaid card; and children who have applied for Medicaid or [place] Health Choice, but were declined, whose parents can pay a reduced fee at time of service.  [place] Adult Dental Access  PROGRAM  [place] [phone] Patients are seen by appointment only. Walk-ins are not accepted. [place] will see patients 18 years of age and older. Monday - Tuesday (8am-5pm) Most Wednesdays (8:30-5pm) $30 per visit, cash only  [facility] Adult Dental Access PROGRAM  [address], [facility] [phone] Patients are seen by appointment only. Walk-ins are not accepted. [place] will see patients 15 years of age and older. One Wednesday Evening (  Monthly: Volunteer Based).  $30 per visit, cash only  [place]  [phone] for adults; Children under age 64, call Graduate Pediatric Dentistry at [phone]. Children aged 28-14, please call [phone] to request a pediatric application.  Dental services are provided in all areas of dental care including fillings, crowns and bridges, complete and partial dentures, implants, gum treatment, root canals, and extractions. Preventive care is also provided. Treatment is provided to both adults and children. Patients are selected via a lottery and there is often a waiting list.   [facility] [address] [place]  [phone] [email or web address]   [facility] [address] [place], [place] [phone], Ext. [phone] Second and Fourth Thursday of each month, opens at 6:30 AM; Clinic ends at 9 AM.  Patients are seen on a first-come first-served basis, and a limited number are seen during each clinic.   [facility]  [address] Hillard Danker [place], [place] [phone]   Eligibility Requirements You must have lived in [place], [place], or [place] counties for at least the last three months.   You cannot be eligible for state or federal sponsored [employer], including [employer], [place], or [employer].   You generally cannot be eligible for healthcare insurance through your employer.    How to apply: Eligibility  screenings are held every Tuesday and Wednesday afternoon from 1:00 pm until 4:00 pm. You do not need an appointment for the interview!  [facility] [address] [place], [place]   [place]  [place] Department  [place]  [phone]    Behavioral Health Resources in the Community: Intensive Outpatient Programs Organization         Address  Phone  Notes  [place]. [address] [place], [place] [phone]   [facility] [address] [place], [place]   ADS: Alcohol & Drug Svcs [address] [place], [place] [employer]   [place] [address],  [place], [place] or [phone]   Substance Abuse Resources Organization         Address  Phone  Notes  Alcohol and Drug Services  [phone]   [place]  [phone]   [facility] [place]   Chinita Pester  [phone]   Residential & Outpatient Substance Abuse Program  [phone]   Psychological Services Organization         Address  Phone  Notes  [facility] [place]  [place]  [phone]   [place] [address], [place] or [phone]    Mobile Crisis Teams Organization         Address  Phone  Notes  [facility], Mobile Crisis Care Unit  [phone]   [facility]  [address] [place], [place]   [place] [address] [place] [phone]    Self-Help/Support Groups Organization         Address  Phone             Notes  [place]. of [place] - variety of support groups  [place] Call for more information  Narcotics Anonymous (NA), [facility] [address], [employer] [place]  2 meetings at  this location  Residential Treatment Programs Organization         Address  Phone  Notes  [facility] [address],    [place]  [phone]   [facility]  [address] [place] 8[address]751, [place], [place]   [place], [place] [phone] Admissions: 8am-3pm M-F  Incentives Substance [place] [address]    [place], [place] [phone]   [facility] [address] [place], [place], [place]   [facility] [address],  [place], [place]   [facility] - Intensive Outpatient [address], [place], [place]   [facility] [place].,  [place], [place] [phone] or [phone]   [facility]idential Treatment Services (RTS) [address] [place], [place] Accepts Medicaid  [facility] [place] [address]  [place] [phone] Substance Abuse/Addiction Treatment   [facility] Organization         Address  Phone  Notes  [facility]  [phone]   Domenic Schwab, PhD [address] Arlis Port[place], [place]   [phone] or [phone]   [place]  [place], [place] [phone]   [facility] [address], [place], [place] [phone] Insurance/Medicaid/sponsorship through [facility] and Families [address], [address] [place]                                    [place], [place] [phone] [place] [address], [place] [phone]    Dr. Adele Schilder  [phone]   [facility] Dept. 1) [address] [place] 2) [place] 3)  [place] [address], [place] [phone]  [phone]   [place] [phone] or [phone] (After Hours)

## 2015-09-04 NOTE — Progress Notes (Signed)
D; PCXR done, Dr. Billy Fischer VS, said that little pneumonia shown, but it was oK. Aware of chest tube leaking.

## 2015-09-04 NOTE — Brief Op Note (Addendum)
09/04/2015  4:50 PM      [place]       [place] [address]             [phone]     09/04/2015  4:51 PM  PATIENT:  [name]  54 y.o. male  PRE-OPERATIVE DIAGNOSIS:  RUL NODULE  POST-OPERATIVE DIAGNOSIS:  Non-small cell carcinoma RUL, clinical stage IA  PROCEDURE:  Procedure(s): VIDEO BRONCHOSCOPY WITH ENDOBRONCHIAL ULTRASOUND RIGHT VIDEO ASSISTED THORACOSCOPY (VATS) WEDGE RESECTION RIGHT UPPER LOBE THORACOSCOPIC RIGHT UPPER LOBECTOMY ON-Q LOCAL ANESTHETIC CATHETER PLACEMENT  SURGEON:  Surgeon(s): Melrose Nakayama, MD  PHYSICIAN ASSISTANT: GINA COLLINS PA-C; WAYNE GOLD PA-C  ANESTHESIA:   general  SPECIMEN:  Source of Specimen:  RUL, MULT LN SAMPLES  DISPOSITION OF SPECIMEN:  Pathology  DRAINS: 1 Chest Tube(s) in the RIGHT HEMITHORAX and (1) Blake drain(s) in the RIGHT HEMITHORAX   PATIENT CONDITION:  PACU - hemodynamically stable.  PRE-OPERATIVE WEIGHT: 15AE  COMPLICATIONS: NO KNOWN  EBL: 200   EBUS- 4L lymph nodes- few atypical cells R VATS- extensive adhesions, 1 cm nodule RUL- + non-small cell carcinoma

## 2015-09-05 ENCOUNTER — Inpatient Hospital Stay (HOSPITAL_COMMUNITY): Payer: Medicare Other

## 2015-09-05 LAB — CBC
HCT: 36.9 % — ABNORMAL LOW (ref 39.0–52.0)
Hemoglobin: 12.6 g/dL — ABNORMAL LOW (ref 13.0–17.0)
MCH: 31.6 pg (ref 26.0–34.0)
MCHC: 34.1 g/dL (ref 30.0–36.0)
MCV: 92.5 fL (ref 78.0–100.0)
PLATELETS: 96 10*3/uL — AB (ref 150–400)
RBC: 3.99 MIL/uL — AB (ref 4.22–5.81)
RDW: 13.6 % (ref 11.5–15.5)
WBC: 12.6 10*3/uL — AB (ref 4.0–10.5)

## 2015-09-05 LAB — POCT I-STAT 3, ART BLOOD GAS (G3+)
Acid-Base Excess: 1 mmol/L (ref 0.0–2.0)
Bicarbonate: 26.1 mEq/L — ABNORMAL HIGH (ref 20.0–24.0)
O2 SAT: 98 %
PCO2 ART: 43 mmHg (ref 35.0–45.0)
PH ART: 7.39 (ref 7.350–7.450)
PO2 ART: 101 mmHg — AB (ref 80.0–100.0)
Patient temperature: 98.2
TCO2: 27 mmol/L (ref 0–100)

## 2015-09-05 LAB — BASIC METABOLIC PANEL
ANION GAP: 6 (ref 5–15)
BUN: 6 mg/dL (ref 6–20)
CHLORIDE: 106 mmol/L (ref 101–111)
CO2: 27 mmol/L (ref 22–32)
Calcium: 8.4 mg/dL — ABNORMAL LOW (ref 8.9–10.3)
Creatinine, Ser: 0.7 mg/dL (ref 0.61–1.24)
Glucose, Bld: 145 mg/dL — ABNORMAL HIGH (ref 65–99)
POTASSIUM: 4 mmol/L (ref 3.5–5.1)
SODIUM: 139 mmol/L (ref 135–145)

## 2015-09-05 LAB — GLUCOSE, CAPILLARY
GLUCOSE-CAPILLARY: 123 mg/dL — AB (ref 65–99)
GLUCOSE-CAPILLARY: 114 mg/dL — AB (ref 65–99)
Glucose-Capillary: 138 mg/dL — ABNORMAL HIGH (ref 65–99)

## 2015-09-05 MED ORDER — HYDROCODONE-ACETAMINOPHEN 5-325 MG PO TABS
1.0000 | ORAL_TABLET | Freq: Four times a day (QID) | ORAL | Status: DC | PRN
  Administered 2015-09-05 – 2015-09-12 (×15): 1 via ORAL
  Filled 2015-09-05 (×15): qty 1
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
## 2015-09-05 NOTE — Progress Notes (Signed)
Patient ID: [name], male   DOB: [birth year], 54 y.o.   MRN: [id number] TCTS DAILY ICU PROGRESS NOTE                   [address]            [place] [address]          [phone]   1 Day Post-Op Procedure(s) (LRB): VIDEO BRONCHOSCOPY WITH ENDOBRONCHIAL ULTRASOUND (N/A) VIDEO ASSISTED THORACOSCOPY (VATS)/WEDGE RESECTION (Right) LOBECTOMY (Right)  Total Length of Stay:  LOS: 1 day   Subjective: Up to chair, alert, neuro intact, cooperative  Objective: Vital signs in last 24 hours: Temp:  [97.3 F (36.3 C)-98.4 F (36.9 C)] 97.6 F (36.4 C) (12/10 0730) Pulse Rate:  [80-105] 92 (12/10 0600) Cardiac Rhythm:  [-] Normal sinus rhythm (12/10 0756) Resp:  [13-29] 27 (12/10 0737) BP: (119-167)/(83-108) 119/108 mmHg (12/10 0600) SpO2:  [90 %-100 %] 98 % (12/10 0814) Arterial Line BP: (141-167)/(74-87) 160/87 mmHg (12/09 1930) Weight:  [188 lb (85.276 kg)] 188 lb (85.276 kg) (12/09 2000)  Filed Weights   09/04/15 2000  Weight: 188 lb (85.276 kg)    Weight change:    Hemodynamic parameters for last 24 hours:    Intake/Output from previous day: 12/09 0701 - 12/10 0700 In: 6403.8 [P.O.:2160; I.V.:4243.8] Out: 3030 [Urine:2240; Blood:200; Chest Tube:590]  Intake/Output this shift:    Current Meds: Scheduled Meds: . acetaminophen  1,000 mg Oral 4 times per day   Or  . acetaminophen (TYLENOL) oral liquid 160 mg/5 mL  1,000 mg Oral 4 times per day  . bisacodyl  10 mg Oral Daily  . carvedilol  12.5 mg Oral BID WC  . emtricitabine-tenofovir AF  1 tablet Oral QHS  . fentaNYL   Intravenous 6 times per day  . mometasone-formoterol  2 puff Inhalation BID  . pantoprazole  40 mg Oral Daily  . PARoxetine  30 mg Oral q morning - 10a  . QUEtiapine  100 mg Oral QHS  . senna-docusate  1 tablet Oral QHS  . traZODone  300 mg Oral QHS   Continuous Infusions: . bupivacaine 0.5 % ON-Q pump SINGLE CATH 400 mL    . dextrose 5 % and 0.9% NaCl 125 mL/hr at  09/05/15 0700   PRN Meds:.albuterol, diphenhydrAMINE **OR** diphenhydrAMINE, Influenza vac split quadrivalent PF, naloxone **AND** sodium chloride, ondansetron (ZOFRAN) IV, potassium chloride  General appearance: alert, cooperative and no distress Neurologic: intact Heart: regular rate and rhythm, S1, S2 normal, no murmur, click, rub or gallop Lungs: diminished breath sounds bibasilar Abdomen: soft, non-tender; bowel sounds normal; no masses,  no organomegaly Extremities: extremities normal, atraumatic, no cyanosis or edema and Homans sign is negative, no sign of DVT Wound: small air leak  Lab Results: CBC: Recent Labs  09/03/15 2324  09/04/15 1611 09/05/15 0455  WBC 7.3  --   --  12.6*  HGB 15.9  < > 15.0 12.6*  HCT 46.1  < > 44.0 36.9*  PLT 116*  --   --  96*  < > = values in this interval not displayed. BMET:  Recent Labs  09/03/15 2324  09/04/15 1611 09/05/15 0455  NA 141  < > 139 139  K 3.7  < > 4.2 4.0  CL 106  --   --  106  CO2 21*  --   --  27  GLUCOSE 113*  --   --  145*  BUN 7  --   --  6  CREATININE 0.79  --   --  0.70  CALCIUM 8.6*  --   --  8.4*  < > = values in this interval not displayed.  PT/INR: No results for input(s): LABPROT, INR in the last 72 hours. Radiology: Dg Chest Port 1 View  09/05/2015  CLINICAL DATA:  Shortness of Breath EXAM: PORTABLE CHEST 1 VIEW COMPARISON:  September 04, 2015 FINDINGS: Central catheter tip is in the superior vena cava. There are chest tubes on the right. There remains a fairly small right apicolateral region pneumothorax without tension component. There is no edema or consolidation. Heart size and pulmonary vascularity are normal. No adenopathy. IMPRESSION: Chest tubes remain on the right. There is a pneumothorax in the right apicolateral region without tension component. No edema or consolidation. Electronically Signed   By: Lowella Grip III M.D.   On: 09/05/2015 07:17   Dg Chest Port 1 View  09/04/2015  CLINICAL  DATA:  Post right upper lobectomy for lung cancer, in PACU. Shortness of breath EXAM: PORTABLE CHEST 1 VIEW COMPARISON:  Frontal and lateral views 09/03/2015 FINDINGS: Patient is rotated to the left. Postsurgical change in the right hemithorax with volume loss in surgical clips at the right hilum. A right-sided chest tube is in place. No definite pneumothorax is seen. Right central line tip projecting over the region of the SVC. Ill-defined opacity in the right midlung zone, favor atelectasis. Cardiomediastinal contours are unchanged. Minimal subsegmental atelectasis at the left lung base. No large pleural effusion. No pulmonary edema. IMPRESSION: Postsurgical change in the right hemithorax with right-sided chest tube in place. No definite pneumothorax. Ill-defined opacity in the right midlung zone, favor atelectasis. Electronically Signed   By: Jeb Levering M.D.   On: 09/04/2015 18:46     Assessment/Plan: S/P Procedure(s) (LRB): VIDEO BRONCHOSCOPY WITH ENDOBRONCHIAL ULTRASOUND (N/A) VIDEO ASSISTED THORACOSCOPY (VATS)/WEDGE RESECTION (Right) LOBECTOMY (Right) Mobilize See progression orders Decrease iv rate D/c aline and foley Leave ct to suction today   [name] 09/05/2015 8:32 AM

## 2015-09-05 NOTE — Progress Notes (Signed)
UR Completed. Nataley Bahri, RN, BSN.  [phone] 

## 2015-09-05 NOTE — Progress Notes (Signed)
Patient ID: [name], male   DOB: 31-May-1961, 54 y.o.   MRN: [id number] EVENING ROUNDS NOTE :     [address]       [place] [address]             [phone]                 1 Day Post-Op Procedure(s) (LRB): VIDEO BRONCHOSCOPY WITH ENDOBRONCHIAL ULTRASOUND (N/A) VIDEO ASSISTED THORACOSCOPY (VATS)/WEDGE RESECTION (Right) LOBECTOMY (Right)  Total Length of Stay:  LOS: 1 day  BP 106/88 mmHg  Pulse 102  Temp(Src) 98.2 F (36.8 C) (Oral)  Resp 19  Ht '6\' 3"'$  (1.905 m)  Wt 188 lb (85.276 kg)  BMI 23.50 kg/m2  SpO2 95%  .Intake/Output      12/09 0701 - 12/10 0700 12/10 0701 - 12/11 0700   P.O. 2160 480   I.V. (mL/kg) 4243.8 (49.8) 292.1 (3.4)   Total Intake(mL/kg) 6403.8 (75.1) 772.1 (9.1)   Urine (mL/kg/hr) 2240 1150 (1.2)   Blood 200    Chest Tube 590 400 (0.4)   Total Output 3030 1550   Net +3373.8 -777.9          . bupivacaine 0.5 % ON-Q pump SINGLE CATH 400 mL    . dextrose 5 % and 0.9% NaCl 10 mL/hr at 09/05/15 0835     Lab Results  Component Value Date   WBC 12.6* 09/05/2015   HGB 12.6* 09/05/2015   HCT 36.9* 09/05/2015   PLT 96* 09/05/2015   GLUCOSE 145* 09/05/2015   CHOL 134 08/11/2015   TRIG 78.0 08/11/2015   HDL 38.90* 08/11/2015   LDLDIRECT 71.2 09/29/2011   LDLCALC 79 08/11/2015   ALT 24 08/26/2015   AST 19 08/26/2015   NA 139 09/05/2015   K 4.0 09/05/2015   CL 106 09/05/2015   CREATININE 0.70 09/05/2015   BUN 6 09/05/2015   CO2 27 09/05/2015   TSH 1.14 08/11/2015   PSA 0.69 12/05/2013   INR 1.03 08/26/2015   HGBA1C 5.6 08/11/2015   MICROALBUR 0.2 10/08/2008   Stable day Air leak present ct to suction  [name] MD  Beeper [phone] Office [phone] 09/05/2015 6:13 PM

## 2015-09-06 ENCOUNTER — Inpatient Hospital Stay (HOSPITAL_COMMUNITY): Payer: Medicare Other

## 2015-09-06 LAB — COMPREHENSIVE METABOLIC PANEL
ALBUMIN: 2.5 g/dL — AB (ref 3.5–5.0)
ALK PHOS: 45 U/L (ref 38–126)
ALT: 17 U/L (ref 17–63)
AST: 19 U/L (ref 15–41)
Anion gap: 5 (ref 5–15)
BILIRUBIN TOTAL: 0.5 mg/dL (ref 0.3–1.2)
CHLORIDE: 106 mmol/L (ref 101–111)
CO2: 29 mmol/L (ref 22–32)
CREATININE: 0.67 mg/dL (ref 0.61–1.24)
Calcium: 8.1 mg/dL — ABNORMAL LOW (ref 8.9–10.3)
GFR calc Af Amer: >60 mL/min (ref >60)
GFR calc non Af Amer: >60 mL/min (ref >60)
Glucose, Bld: 131 mg/dL — ABNORMAL HIGH (ref 65–99)
Potassium: 3.3 mmol/L — ABNORMAL LOW (ref 3.5–5.1)
SODIUM: 140 mmol/L (ref 135–145)
TOTAL PROTEIN: 4.9 g/dL — AB (ref 6.5–8.1)

## 2015-09-06 LAB — GLUCOSE, CAPILLARY: GLUCOSE-CAPILLARY: 114 mg/dL — AB (ref 65–99)

## 2015-09-06 LAB — CBC
HEMATOCRIT: 32.9 % — AB (ref 39.0–52.0)
HEMOGLOBIN: 11.1 g/dL — AB (ref 13.0–17.0)
MCH: 31.4 pg (ref 26.0–34.0)
MCHC: 33.7 g/dL (ref 30.0–36.0)
MCV: 92.9 fL (ref 78.0–100.0)
Platelets: 80 10*3/uL — ABNORMAL LOW (ref 150–400)
RBC: 3.54 MIL/uL — AB (ref 4.22–5.81)
RDW: 13.4 % (ref 11.5–15.5)
WBC: 8.3 10*3/uL (ref 4.0–10.5)

## 2015-09-06 MED ORDER — POTASSIUM CHLORIDE 10 MEQ/50ML IV SOLN
10.0000 meq | INTRAVENOUS | Status: AC
  Administered 2015-09-06 (×3): 10 meq via INTRAVENOUS
  Filled 2015-09-06 (×3): qty 50

## 2015-09-06 NOTE — Progress Notes (Addendum)
Patient ID: [name], male   DOB: [birth year], 54 y.o.   MRN: [id number] TCTS DAILY ICU PROGRESS NOTE                   [address]            [place] [address]          [phone]   2 Days Post-Op Procedure(s) (LRB): VIDEO BRONCHOSCOPY WITH ENDOBRONCHIAL ULTRASOUND (N/A) VIDEO ASSISTED THORACOSCOPY (VATS)/WEDGE RESECTION (Right) LOBECTOMY (Right)  Total Length of Stay:  LOS: 2 days   Subjective: Stable in icu, pain control good, restated on preop meds  Objective: Vital signs in last 24 hours: Temp:  [97.5 F (36.4 C)-99 F (37.2 C)] 98.5 F (36.9 C) (12/11 0728) Pulse Rate:  [80-107] 105 (12/11 0700) Cardiac Rhythm:  [-] Normal sinus rhythm (12/11 0746) Resp:  [13-31] 22 (12/11 0727) BP: (89-156)/(54-111) 134/75 mmHg (12/11 0700) SpO2:  [90 %-99 %] 93 % (12/11 0727)  Filed Weights   09/04/15 2000  Weight: 188 lb (85.276 kg)    Weight change:    Hemodynamic parameters for last 24 hours:    Intake/Output from previous day: 12/10 0701 - 12/11 0700 In: 2422.1 [P.O.:1920; I.V.:402.1; IV Piggyback:100] Out: 5701 [Urine:2775; Chest Tube:700]  Intake/Output this shift: Total I/O In: 50 [IV Piggyback:50] Out: -   Current Meds: Scheduled Meds: . acetaminophen  1,000 mg Oral 4 times per day   Or  . acetaminophen (TYLENOL) oral liquid 160 mg/5 mL  1,000 mg Oral 4 times per day  . bisacodyl  10 mg Oral Daily  . carvedilol  12.5 mg Oral BID WC  . emtricitabine-tenofovir AF  1 tablet Oral QHS  . fentaNYL   Intravenous 6 times per day  . mometasone-formoterol  2 puff Inhalation BID  . pantoprazole  40 mg Oral Daily  . PARoxetine  30 mg Oral q morning - 10a  . potassium chloride  10 mEq Intravenous Q1 Hr x 3  . QUEtiapine  100 mg Oral QHS  . senna-docusate  1 tablet Oral QHS  . traZODone  300 mg Oral QHS   Continuous Infusions: . bupivacaine 0.5 % ON-Q pump SINGLE CATH 400 mL    . dextrose 5 % and 0.9% NaCl 10 mL/hr at 09/05/15 0835   PRN  Meds:.albuterol, diphenhydrAMINE **OR** diphenhydrAMINE, HYDROcodone-acetaminophen, Influenza vac split quadrivalent PF, naloxone **AND** sodium chloride, ondansetron (ZOFRAN) IV, potassium chloride  General appearance: alert, cooperative and no distress Neurologic: intact Heart: regular rate and rhythm, S1, S2 normal, no murmur, click, rub or gallop Lungs: diminished breath sounds bibasilar Abdomen: soft, non-tender; bowel sounds normal; no masses,  no organomegaly Extremities: extremities normal, atraumatic, no cyanosis or edema and Homans sign is negative, no sign of DVT Wound: small air leak, less then yesterday, ct to water seal today  Lab Results: CBC: Recent Labs  09/05/15 0455 09/06/15 0420  WBC 12.6* 8.3  HGB 12.6* 11.1*  HCT 36.9* 32.9*  PLT 96* 80*   BMET:  Recent Labs  09/05/15 0455 09/06/15 0420  NA 139 140  K 4.0 3.3*  CL 106 106  CO2 27 29  GLUCOSE 145* 131*  BUN 6 <5*  CREATININE 0.70 0.67  CALCIUM 8.4* 8.1*    PT/INR: No results for input(s): LABPROT, INR in the last 72 hours. Radiology: Dg Chest Port 1 View  09/06/2015  CLINICAL DATA:  Day 2 postop right upper lobectomy for lung cancer. EXAM: PORTABLE CHEST 1 VIEW COMPARISON:  09/05/2015 and earlier. FINDINGS: Postop changes related to right upper lobectomy. To right chest tubes in place with no pneumothorax. Minimal linear atelectasis or scar at the base of the remaining right lung. Left lung remains clear. Cardiac silhouette normal in size. Right subclavian central venous catheter tip projects over the upper SVC. IMPRESSION: 1. Support apparatus satisfactory. 2. No pneumothorax. 3. Minimal atelectasis or scar at the right lung base. No acute cardiopulmonary disease otherwise. Electronically Signed   By: Evangeline Dakin M.D.   On: 09/06/2015 07:33     Assessment/Plan: S/P Procedure(s) (LRB): VIDEO BRONCHOSCOPY WITH ENDOBRONCHIAL ULTRASOUND (N/A) VIDEO ASSISTED THORACOSCOPY (VATS)/WEDGE RESECTION  (Right) LOBECTOMY (Right) Mobilize Diuresis Thrombocytopenia- watch on lovenox Decreased air leak today, chest tube to water seal, poss start removal ct in am To 3s Hypokalemia- replace kcl, getting iv runs Good pain control , patient cooperative, spends most of day talking on phone     [name] 09/06/2015 8:15 AM

## 2015-09-06 NOTE — Progress Notes (Signed)
Patient tx to 3S, patient belongings at bedside: clothes, cell phone, cell phone charger, wallet and jacket; SCDs at bedside, meds and chart given to secretary, receiving RN at bedside with no further questions.   Rowe Pavy, RN

## 2015-09-07 ENCOUNTER — Encounter (HOSPITAL_COMMUNITY): Payer: Self-pay | Admitting: Thoracic Surgery (Cardiothoracic Vascular Surgery)

## 2015-09-07 ENCOUNTER — Inpatient Hospital Stay (HOSPITAL_COMMUNITY): Payer: Medicare Other

## 2015-09-07 LAB — CBC
HCT: 33.6 % — ABNORMAL LOW (ref 39.0–52.0)
Hemoglobin: 11.2 g/dL — ABNORMAL LOW (ref 13.0–17.0)
MCH: 30.9 pg (ref 26.0–34.0)
MCHC: 33.3 g/dL (ref 30.0–36.0)
MCV: 92.6 fL (ref 78.0–100.0)
Platelets: 83 10*3/uL — ABNORMAL LOW (ref 150–400)
RBC: 3.63 MIL/uL — ABNORMAL LOW (ref 4.22–5.81)
RDW: 13.5 % (ref 11.5–15.5)
WBC: 9.7 10*3/uL (ref 4.0–10.5)

## 2015-09-07 LAB — BASIC METABOLIC PANEL
Anion gap: 6 (ref 5–15)
BUN: 5 mg/dL — ABNORMAL LOW (ref 6–20)
CO2: 27 mmol/L (ref 22–32)
Calcium: 8.6 mg/dL — ABNORMAL LOW (ref 8.9–10.3)
Chloride: 105 mmol/L (ref 101–111)
Creatinine, Ser: 0.64 mg/dL (ref 0.61–1.24)
GFR calc Af Amer: >60 mL/min (ref >60)
GFR calc non Af Amer: >60 mL/min (ref >60)
Glucose, Bld: 139 mg/dL — ABNORMAL HIGH (ref 65–99)
Potassium: 3.6 mmol/L (ref 3.5–5.1)
Sodium: 138 mmol/L (ref 135–145)

## 2015-09-07 LAB — GLUCOSE, CAPILLARY
GLUCOSE-CAPILLARY: 114 mg/dL — AB (ref 65–99)
Glucose-Capillary: 145 mg/dL — ABNORMAL HIGH (ref 65–99)
Glucose-Capillary: 135 mg/dL — ABNORMAL HIGH (ref 65–99)
Glucose-Capillary: 102 mg/dL — ABNORMAL HIGH (ref 65–99)

## 2015-09-07 MED ORDER — GUAIFENESIN ER 600 MG PO TB12
600.0000 mg | ORAL_TABLET | Freq: Two times a day (BID) | ORAL | Status: DC
  Administered 2015-09-07 – 2015-09-12 (×11): 600 mg via ORAL
  Filled 2015-09-07 (×11): qty 1

## 2015-09-07 NOTE — Progress Notes (Signed)
Pt ambulated 100 feet around the unit refused to sit in the chair will continue to monitor

## 2015-09-07 NOTE — Progress Notes (Addendum)
[address]       [place], [address]             [phone]          3 Days Post-Op Procedure(s) (LRB): VIDEO BRONCHOSCOPY WITH ENDOBRONCHIAL ULTRASOUND (N/A) VIDEO ASSISTED THORACOSCOPY (VATS)/WEDGE RESECTION (Right) LOBECTOMY (Right)  Subjective: Feels ok this am. Coughing, but not able to bring up any mucus. Walking in halls some, but feels "weak" when up and about. Appetite good.   Objective: Vital signs in last 24 hours: Patient Vitals for the past 24 hrs:  BP Temp Temp src Pulse Resp SpO2  09/07/15 0340 122/69 mmHg 98.2 F (36.8 C) Oral 94 (!) 22 91 %  09/06/15 2240 111/74 mmHg - - 89 (!) 23 91 %  09/06/15 2238 - 98.6 F (37 C) Oral - - -  09/06/15 1931 - 99 F (37.2 C) Oral - - -  09/06/15 1930 105/65 mmHg - - (!) 102 (!) 36 94 %  09/06/15 1740 123/66 mmHg 98 F (36.7 C) Oral (!) 105 (!) 25 98 %  09/06/15 1700 117/70 mmHg - - (!) 104 (!) 27 90 %  09/06/15 1600 119/85 mmHg - - (!) 109 (!) 26 100 %  09/06/15 1521 - 97.6 F (36.4 C) Oral - - -  09/06/15 1500 121/87 mmHg - - 99 (!) 27 94 %  09/06/15 1400 (!) 125/96 mmHg - - 98 (!) 22 97 %  09/06/15 1300 109/78 mmHg - - 89 (!) 22 94 %  09/06/15 1200 (!) 96/59 mmHg - - 92 (!) 27 94 %  09/06/15 1144 - - - - - 97 %  09/06/15 1127 - 98.3 F (36.8 C) Oral - - -  09/06/15 1100 (!) 156/96 mmHg - - 95 (!) 29 96 %  09/06/15 1000 - - - 88 (!) 22 91 %  09/06/15 0916 - - - - - 97 %  09/06/15 0900 137/74 mmHg - - (!) 108 (!) 25 91 %  09/06/15 0800 122/75 mmHg - - 96 20 97 %   Current Weight  09/04/15 188 lb (85.276 kg)     Intake/Output from previous day: 12/11 0701 - 12/12 0700 In: 1960 [P.O.:1680; I.V.:230; IV Piggyback:50] Out: 2353 [Urine:3050; Chest Tube:585]    PHYSICAL EXAM:  Heart: RRR Lungs: Few exp wheezes bilaterally Wound: Clean and dry Chest tube: + air leak with cough and with talking    Lab Results: CBC: Recent Labs  09/06/15 0420 09/07/15 0552  WBC 8.3 9.7    HGB 11.1* 11.2*  HCT 32.9* 33.6*  PLT 80* 83*   BMET:  Recent Labs  09/06/15 0420 09/07/15 0552  NA 140 138  K 3.3* 3.6  CL 106 105  CO2 29 27  GLUCOSE 131* 139*  BUN <5* 5*  CREATININE 0.67 0.64  CALCIUM 8.1* 8.6*    PT/INR: No results for input(s): LABPROT, INR in the last 72 hours.  CXR: around 20% right ptx   Assessment/Plan: S/P Procedure(s) (LRB): VIDEO BRONCHOSCOPY WITH ENDOBRONCHIAL ULTRASOUND (N/A) VIDEO ASSISTED THORACOSCOPY (VATS)/WEDGE RESECTION (Right) LOBECTOMY (Right)  CXR with increased R ptx/space. CT with + air leak with cough. Will leave CT to water seal for now. May need to return to suction if this worsens.  Pulm- will add flutter valve, Mucinex for thick sputum. Continue pulm toilet/nebs, home inhalers.  Thrombocytopenia- plts stable, starting to improve. Will watch.  Mild hypokalemia- replace per protocol.  Continue ambulation.  LOS: 3 days    COLLINS,GINA H 09/07/2015  Patient seen and examined, agree with above Will dc Blake drain, keep CT to water seal  [place] C. Roxan Hockey, MD [facility] [phone]

## 2015-09-07 NOTE — Progress Notes (Signed)
D/c'd posterior chest tube and on-Q per MD order. Pt tolerated procedure well. Will continue to monitor.

## 2015-09-08 ENCOUNTER — Inpatient Hospital Stay (HOSPITAL_COMMUNITY): Payer: Medicare Other

## 2015-09-08 LAB — GLUCOSE, CAPILLARY
GLUCOSE-CAPILLARY: 108 mg/dL — AB (ref 65–99)
GLUCOSE-CAPILLARY: 126 mg/dL — AB (ref 65–99)
Glucose-Capillary: 117 mg/dL — ABNORMAL HIGH (ref 65–99)

## 2015-09-08 NOTE — Care Management Important Message (Signed)
Important Message  Patient Details  Name: [name] MRN: [id number] Date of Birth: [birth year]   Medicare Important Message Given:  Yes    [name] May 09/08/2015, 9:54 AM

## 2015-09-08 NOTE — Progress Notes (Addendum)
       [address]       [place] [address]             [phone]          4 Days Post-Op Procedure(s) (LRB): VIDEO BRONCHOSCOPY WITH ENDOBRONCHIAL ULTRASOUND (N/A) VIDEO ASSISTED THORACOSCOPY (VATS)/WEDGE RESECTION (Right) LOBECTOMY (Right)  Subjective: Feels better today. Less cough overnight. Walking in halls without problem. Breathing stable.   Objective: Vital signs in last 24 hours: Patient Vitals for the past 24 hrs:  BP Temp Temp src Pulse Resp SpO2  09/08/15 0731 - - - - - 93 %  09/08/15 0514 - - - - (!) 24 94 %  09/08/15 0415 120/63 mmHg - - 89 (!) 28 93 %  09/08/15 0411 - 98.5 F (36.9 C) Oral - - -  09/08/15 0010 - - - - (!) 22 97 %  09/07/15 2245 126/86 mmHg - - 94 (!) 30 95 %  09/07/15 2244 - 97.9 F (36.6 C) Oral - - -  09/07/15 2033 - - - - (!) 22 -  09/07/15 1921 - - - - - 96 %  09/07/15 1905 110/75 mmHg - - - (!) 22 -  09/07/15 1903 - 97.7 F (36.5 C) Oral - - -  09/07/15 1635 119/73 mmHg 98.7 F (37.1 C) Oral 99 (!) 37 95 %  09/07/15 1245 116/80 mmHg 98.4 F (36.9 C) Oral 92 (!) 31 93 %  09/07/15 0858 - - - - - 95 %   Current Weight  09/04/15 188 lb (85.276 kg)     Intake/Output from previous day: 12/12 0701 - 12/13 0700 In: 780 [P.O.:600; I.V.:180] Out: 955 [Urine:902; Stool:3; Chest Tube:50]    PHYSICAL EXAM:  Heart: RRR Lungs: Slightly diminished BS in R base, no wheezes today Wound:Clean and dry Chest tube: Intermittent 1/7 air leak with cough    Lab Results: CBC: Recent Labs  09/06/15 0420 09/07/15 0552  WBC 8.3 9.7  HGB 11.1* 11.2*  HCT 32.9* 33.6*  PLT 80* 83*   BMET:  Recent Labs  09/06/15 0420 09/07/15 0552  NA 140 138  K 3.3* 3.6  CL 106 105  CO2 29 27  GLUCOSE 131* 139*  BUN <5* 5*  CREATININE 0.67 0.64  CALCIUM 8.1* 8.6*    PT/INR: No results for input(s): LABPROT, INR in the last 72 hours.   Path: INVASIVE ADENOCARCINOMA, MODERATELY DIFFERENTIATED (T1a,  N0)   Assessment/Plan: S/P Procedure(s) (LRB): VIDEO BRONCHOSCOPY WITH ENDOBRONCHIAL ULTRASOUND (N/A) VIDEO ASSISTED THORACOSCOPY (VATS)/WEDGE RESECTION (Right) LOBECTOMY (Right)  R ptx/space stable on CXR, CT with decreased air leak, still with small intermittent 1/7. Continue CT to water seal. Hopefully can d/c remaining CT soon.  Continue ambulation, pulm toilet.  Recheck labs in am.   LOS: 4 days    COLLINS,GINA H 09/08/2015  Patient seen and examined, agree with above. Still has a small air leak so need to keep tube for now PATH- T1a,N0- stage IA- patient informed  Revonda Standard. Roxan Hockey, MD [facility] [phone]

## 2015-09-08 NOTE — Anesthesia Postprocedure Evaluation (Signed)
Anesthesia Post Note  Patient: [name]  Procedure(s) Performed: Procedure(s) (LRB): VIDEO BRONCHOSCOPY WITH ENDOBRONCHIAL ULTRASOUND (N/A) VIDEO ASSISTED THORACOSCOPY (VATS)/WEDGE RESECTION (Right) LOBECTOMY (Right)  Patient location during evaluation: PACU Anesthesia Type: General Level of consciousness: awake Pain management: pain level controlled Vital Signs Assessment: post-procedure vital signs reviewed and stable Respiratory status: spontaneous breathing Cardiovascular status: stable Postop Assessment: no signs of nausea or vomiting Anesthetic complications: no    Last Vitals:  Filed Vitals:   09/08/15 0731 09/08/15 0804  BP:    Pulse:    Temp: 36.6 C   Resp:  20    Last Pain:  Filed Vitals:   09/08/15 0850  PainSc: 0-No pain                 Hildred Mollica

## 2015-09-08 NOTE — Progress Notes (Signed)
UR COMPLETED  

## 2015-09-09 ENCOUNTER — Inpatient Hospital Stay (HOSPITAL_COMMUNITY): Payer: Medicare Other

## 2015-09-09 LAB — BASIC METABOLIC PANEL
Anion gap: 7 (ref 5–15)
BUN: 7 mg/dL (ref 6–20)
CALCIUM: 8.6 mg/dL — AB (ref 8.9–10.3)
CHLORIDE: 105 mmol/L (ref 101–111)
CO2: 25 mmol/L (ref 22–32)
CREATININE: 0.68 mg/dL (ref 0.61–1.24)
GFR calc non Af Amer: >60 mL/min (ref >60)
Glucose, Bld: 117 mg/dL — ABNORMAL HIGH (ref 65–99)
Potassium: 3.8 mmol/L (ref 3.5–5.1)
SODIUM: 137 mmol/L (ref 135–145)

## 2015-09-09 LAB — CBC
HCT: 32 % — ABNORMAL LOW (ref 39.0–52.0)
Hemoglobin: 11.1 g/dL — ABNORMAL LOW (ref 13.0–17.0)
MCH: 31.6 pg (ref 26.0–34.0)
MCHC: 34.7 g/dL (ref 30.0–36.0)
MCV: 91.2 fL (ref 78.0–100.0)
PLATELETS: 106 10*3/uL — AB (ref 150–400)
RBC: 3.51 MIL/uL — AB (ref 4.22–5.81)
RDW: 13.4 % (ref 11.5–15.5)
WBC: 7.8 10*3/uL (ref 4.0–10.5)

## 2015-09-09 LAB — GLUCOSE, CAPILLARY: GLUCOSE-CAPILLARY: 127 mg/dL — AB (ref 65–99)

## 2015-09-09 MED ORDER — ALPRAZOLAM 0.25 MG PO TABS
0.2500 mg | ORAL_TABLET | Freq: Three times a day (TID) | ORAL | Status: DC | PRN
  Administered 2015-09-09 – 2015-09-11 (×4): 0.25 mg via ORAL
  Filled 2015-09-09 (×4): qty 1

## 2015-09-09 NOTE — Progress Notes (Signed)
Pt c/o feeling anxious after an upsetting phone call from a friend.  Physician extender paged to ask for something for the anxiety.

## 2015-09-09 NOTE — Progress Notes (Addendum)
[address]       [place] [address]             [phone]          5 Days Post-Op Procedure(s) (LRB): VIDEO BRONCHOSCOPY WITH ENDOBRONCHIAL ULTRASOUND (N/A) VIDEO ASSISTED THORACOSCOPY (VATS)/WEDGE RESECTION (Right) LOBECTOMY (Right)  Subjective: Feels well, no complaints.   Objective: Vital signs in last 24 hours: Patient Vitals for the past 24 hrs:  BP Temp Temp src Pulse Resp SpO2  09/09/15 0549 - - - - - 100 %  09/09/15 0509 135/74 mmHg - - 87 (!) 25 93 %  09/09/15 0459 - 98.4 F (36.9 C) Oral - - -  09/08/15 2221 115/73 mmHg 98.3 F (36.8 C) Oral 89 (!) 24 97 %  09/08/15 2048 - - - - - 97 %  09/08/15 1907 (!) 109/98 mmHg - - (!) 101 16 94 %  09/08/15 1903 - 98.1 F (36.7 C) Oral - - -  09/08/15 1642 121/79 mmHg - - 93 (!) 27 96 %  09/08/15 1601 - - - - 16 100 %  09/08/15 1430 - 98 F (36.7 C) Oral - - -  09/08/15 1300 - 98 F (36.7 C) Oral - - -  09/08/15 1236 - - - - 20 98 %  09/08/15 1235 116/75 mmHg - - 100 17 94 %  09/08/15 0804 - - - - 20 94 %   Current Weight  09/04/15 188 lb (85.276 kg)     Intake/Output from previous day: 12/13 0701 - 12/14 0700 In: 530 [P.O.:240; I.V.:290] Out: 2330 [Urine:2300; Chest Tube:30]    PHYSICAL EXAM:  Heart: RRR Lungs: Slightly diminished BS in bases, no wheezes Wound: Clean and dry Chest tube: + air leak with cough    Lab Results: CBC: Recent Labs  09/07/15 0552 09/09/15 0355  WBC 9.7 7.8  HGB 11.2* 11.1*  HCT 33.6* 32.0*  PLT 83* 106*   BMET:  Recent Labs  09/07/15 0552 09/09/15 0355  NA 138 137  K 3.6 3.8  CL 105 105  CO2 27 25  GLUCOSE 139* 117*  BUN 5* 7  CREATININE 0.64 0.68  CALCIUM 8.6* 8.6*    PT/INR: No results for input(s): LABPROT, INR in the last 72 hours.  CXR: FINDINGS: Right subclavian line in stable position . Right chest tube in stable position. Stable moderate right pneumothorax. No interim change. Mediastinum is stable. Postsurgical  changes right lung. Low lung volumes with mild basilar atelectasis. Mild left mid lung field infiltrate cannot be excluded. Cardiomegaly. No pulmonary venous congestion.  IMPRESSION: 1. Right subclavian line and right chest tube in stable position. Stable moderate right pneumothorax. Postsurgical changes right lung. 2. Persistent low lung volumes with mild basilar atelectasis. Mild infiltrate left mid lung field cannot be excluded.   Assessment/Plan: S/P Procedure(s) (LRB): VIDEO BRONCHOSCOPY WITH ENDOBRONCHIAL ULTRASOUND (N/A) VIDEO ASSISTED THORACOSCOPY (VATS)/WEDGE RESECTION (Right) LOBECTOMY (Right) Persistent air leak, actually looks larger today. CXR stable. Continue CT to water seal for now. Continue ambulation, pulm toilet. Not using PCA, will d/c PCA and saline lock IV.   LOS: 5 days    COLLINS,GINA H 09/09/2015  Patient seen and examined. When I examined him he had minimal air leak but a lot of tidal motion. He is relatively thin so he may be entraining air around the tube. Will redress and reassess later today  Remo Lipps C. Roxan Hockey, MD [facility] [phone]

## 2015-09-09 NOTE — Progress Notes (Signed)
      [address]       ,[place] [address]             [phone]      No complaints, he is anxious to get CT out and go home  BP 110/61 mmHg  Pulse 92  Temp(Src) 98.1 F (36.7 C) (Oral)  Resp 16  Ht '6\' 3"'$  (1.905 m)  Wt 188 lb (85.276 kg)  BMI 23.50 kg/m2  SpO2 97%  After redressing chest tube with vaseline gauze the air leak is diminished but not resolved  Will place to suction overnight and then back to water seal in AM prior to [place]. Roxan Hockey, MD [facility] [phone]

## 2015-09-09 NOTE — Progress Notes (Signed)
At 0800 patient had used 87mg on the Fentanyl PCA.  Order received to d/c PCA.  Nine (9) mls wasted in sink and witnessed by TNoreene Larsson RN.

## 2015-09-10 ENCOUNTER — Inpatient Hospital Stay (HOSPITAL_COMMUNITY): Payer: Medicare Other

## 2015-09-10 NOTE — Progress Notes (Signed)
Patient provided with printed information on bronchial valves at his request.

## 2015-09-10 NOTE — Progress Notes (Addendum)
       [address]       [place] [address]             [phone]          6 Days Post-Op Procedure(s) (LRB): VIDEO BRONCHOSCOPY WITH ENDOBRONCHIAL ULTRASOUND (N/A) VIDEO ASSISTED THORACOSCOPY (VATS)/WEDGE RESECTION (Right) LOBECTOMY (Right)  Subjective: Comfortable, no complaints except depressed about being in the hospital.   Objective: Vital signs in last 24 hours: Patient Vitals for the past 24 hrs:  BP Temp Temp src Pulse Resp SpO2  09/10/15 0617 - - - - - 100 %  09/10/15 0429 124/75 mmHg 97.8 F (36.6 C) Axillary 94 13 99 %  09/09/15 2252 114/69 mmHg 97.9 F (36.6 C) Oral 92 (!) 27 98 %  09/09/15 2039 124/82 mmHg - - 83 20 -  09/09/15 1959 124/82 mmHg 97.6 F (36.4 C) Oral 90 (!) 27 98 %  09/09/15 1625 123/78 mmHg 98.3 F (36.8 C) Oral - - -  09/09/15 1120 110/61 mmHg 98.1 F (36.7 C) Oral 92 16 97 %  09/09/15 0809 129/71 mmHg 98.3 F (36.8 C) Oral 90 (!) 22 98 %   Current Weight  09/04/15 188 lb (85.276 kg)     Intake/Output from previous day: 12/14 0701 - 12/15 0700 In: 1400 [P.O.:1400] Out: 1875 [Urine:1825; Chest Tube:50]    PHYSICAL EXAM:  Heart: RRR Lungs: Slightly decreased BS in bases Wound: Clean and dry Chest tube: Intermittent 1/7 air leak/tidaling with cough    Lab Results: CBC: Recent Labs  09/09/15 0355  WBC 7.8  HGB 11.1*  HCT 32.0*  PLT 106*   BMET:  Recent Labs  09/09/15 0355  NA 137  K 3.8  CL 105  CO2 25  GLUCOSE 117*  BUN 7  CREATININE 0.68  CALCIUM 8.6*    PT/INR: No results for input(s): LABPROT, INR in the last 72 hours.  CXR: FINDINGS: Grossly unchanged cardiac silhouette and mediastinal contours with postsurgical change of the right hilum. Stable positioning of support apparatus. Unchanged small right apical pneumothorax. Grossly unchanged mild elevation of the right hemidiaphragm and associated bibasilar opacities, right greater than left. No new focal airspace opacities.  Trace left-sided pleural effusion is not excluded. No evidence of edema. Unchanged bones.  IMPRESSION: 1. Stable positioning of support apparatus. Unchanged small right apical pneumothorax. 2. Grossly unchanged bibasilar atelectasis, right greater than left.   Assessment/Plan: S/P Procedure(s) (LRB): VIDEO BRONCHOSCOPY WITH ENDOBRONCHIAL ULTRASOUND (N/A) VIDEO ASSISTED THORACOSCOPY (VATS)/WEDGE RESECTION (Right) LOBECTOMY (Right) CXR this am was taken on suction rather than water seal as ordered. I took his tube off suction and observed it myself, and he has a lot of tidaling with cough, but only an intermittent tiny air leak. Will discuss with MD- possibly repeat CXR later this am.  Hopefully can d/c CT soon.   LOS: 6 days    COLLINS,GINA H 09/10/2015  Patient seen and examined, agree with above As noted above he still has an air leak. It is intermittent, but too much to pull CT  [place] C. Roxan Hockey, MD [facility] [phone]

## 2015-09-10 NOTE — Progress Notes (Signed)
Right central line removed.  Pressure held for 5 minutes.  Site cleansed with betadine, covered with gauze and tape.  Pt instructed to remain flat until 10:25 a.m.  Patient verbalized his understanding.  Peripheral IV started to right hand, 20 gauge x 1 attempt.

## 2015-09-11 ENCOUNTER — Inpatient Hospital Stay (HOSPITAL_COMMUNITY): Payer: Medicare Other
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
## 2015-09-11 NOTE — Progress Notes (Signed)
Transitioned pt to mini-express per MD order. Pt tolerated procedure well. Will continue to monitor.

## 2015-09-11 NOTE — Progress Notes (Addendum)
       [address]       [place] [address]             [phone]          7 Days Post-Op Procedure(s) (LRB): VIDEO BRONCHOSCOPY WITH ENDOBRONCHIAL ULTRASOUND (N/A) VIDEO ASSISTED THORACOSCOPY (VATS)/WEDGE RESECTION (Right) LOBECTOMY (Right)  Subjective: Feels well. No complaints. Wants to go home.   Objective: Vital signs in last 24 hours: Patient Vitals for the past 24 hrs:  BP Temp Temp src Pulse Resp SpO2  09/11/15 0501 - 97.9 F (36.6 C) Oral - - -  09/11/15 0500 137/89 mmHg - - 91 20 98 %  09/11/15 0034 - 97.9 F (36.6 C) Oral - - -  09/11/15 0030 110/69 mmHg - - 80 (!) 21 98 %  09/10/15 1944 - - - - - 100 %  09/10/15 1938 126/72 mmHg - - 86 (!) 25 100 %  09/10/15 1930 - 98.3 F (36.8 C) Oral - - -  09/10/15 1556 117/85 mmHg 98 F (36.7 C) Oral - - -  09/10/15 1128 121/87 mmHg 97.9 F (36.6 C) Oral - - -  09/10/15 0823 - - - - - 96 %  09/10/15 0820 (!) 125/93 mmHg 97.7 F (36.5 C) Oral 91 16 97 %   Current Weight  09/04/15 188 lb (85.276 kg)     Intake/Output from previous day: 12/15 0701 - 12/16 0700 In: 840 [P.O.:840] Out: 1340 [Urine:1300; Chest Tube:40]    PHYSICAL EXAM:  Heart: Tachy, 100-110s Lungs: Clear, slightly decreased in bases Wound: Clean and dry Chest tube: Intermittent air leak with cough    Lab Results: CBC: Recent Labs  09/09/15 0355  WBC 7.8  HGB 11.1*  HCT 32.0*  PLT 106*   BMET:  Recent Labs  09/09/15 0355  NA 137  K 3.8  CL 105  CO2 25  GLUCOSE 117*  BUN 7  CREATININE 0.68  CALCIUM 8.6*    PT/INR: No results for input(s): LABPROT, INR in the last 72 hours.    Assessment/Plan: S/P Procedure(s) (LRB): VIDEO BRONCHOSCOPY WITH ENDOBRONCHIAL ULTRASOUND (N/A) VIDEO ASSISTED THORACOSCOPY (VATS)/WEDGE RESECTION (Right) LOBECTOMY (Right) CXR not done yet this am. CT continues to have an intermittent air leak with cough. Will continue CT to water seal and follow up CXR. May need to  consider switch to Mini express vs endobronchial valve placement if air leak persists. CV- tachy this am, low 100s. Pt states he has had 2 cups of coffee and his HR goes up with caffeine. Currently on Coreg 12.5 mg bid- may need to increase does if HR remains elevated.   LOS: 7 days    COLLINS,GINA H 09/11/2015  Continues to a vexing air leak. It is intermittent. Hard to tell if it is a very slow leak or if he entrains air around tube into chest. Options are to clamp or remove tube and follow/ replace tube if pneumo worsens, place bronchial valves, or change to mini-express and send home with chest tube. Will check CXR and then decide how to proceed.  Revonda Standard Roxan Hockey, MD [facility] [phone]  CXR actually looks a little better today but there is still a small apical space/ pneumothorax After discussing options with patient, he would like to try a mini-express and possibly go home with CT  Remo Lipps C. Roxan Hockey, MD [facility] [phone]

## 2015-09-12 ENCOUNTER — Other Ambulatory Visit: Payer: Self-pay | Admitting: Internal Medicine

## 2015-09-12 ENCOUNTER — Inpatient Hospital Stay (HOSPITAL_COMMUNITY): Payer: Medicare Other

## 2015-09-12 MED ORDER — GUAIFENESIN ER 600 MG PO TB12: 600.0000 mg | ORAL_TABLET | Freq: Two times a day (BID) | ORAL | Status: DC | PRN

## 2015-09-12 MED ORDER — HYDROCODONE-ACETAMINOPHEN 5-325 MG PO TABS: 1.0000 | ORAL_TABLET | Freq: Four times a day (QID) | ORAL | Status: DC | PRN

## 2015-09-12 NOTE — Progress Notes (Signed)
Discharge instructions given. No questions or concerns at this time. Pt knows to call the [facility] number regarding is 3-in-1. Pt d/c'd via family vehicle.

## 2015-09-12 NOTE — Progress Notes (Signed)
Cm called and spoke with Caprice Kluver with [facility] to advise that patient needs to be seen tomorrow for daily Chest tube care and referral accepted.

## 2015-09-12 NOTE — Progress Notes (Signed)
CM spoke to patient who said that he would not be able to fit the 3N1 in the car at discharge because it is a 2 seater. CM called and spoke to [place] with [facility] who said that he would not be able to have it delivered, it would have to be picked up in the retail store. Patient said that he knows where one was and CM placed contact information on his discharge paperwork as well. RN Olivia Mackie was updated also and no further CM needs communicated.

## 2015-09-12 NOTE — Care Management Note (Signed)
Case Management Note  Patient Details  Name: [name] MRN: [id number] Date of Birth: 07/10/1961  Subjective/Objective:                  Lung lesion;  VIDEO ASSISTED THORACOSCOPY (VATS)/WEDGE RESECTION (Right) LOBECTOMY (Right)  Chest tube- connected to Mini Express   Action/Plan: Cm spoke to patient who said that he is going home with a chest tube and needed [facility] RN. Cm offered patient choice and he said he wanted [facility] as first choice but if they could not come out in the next couple days then he would go with [facility]. CM called Amy with [facility] and she was already aware of patient. She said that the referral was accepted and services would start within 1-2 days.   Patient said that he does not need a walker but could benefit from a 3N1 and chose [facility] for DME. CM called Merry Proud with DME and will have 3N1 delivered to the bedside.  Patient said that his sons and fmaily would be at home with him and that he has plenty of home support. Patient denies any difficulty obtaining medications and does not need any HH therapy.   No further CM needs communicated and patient preparing for d/c home today.    Expected Discharge Date:  09/12/15               Expected Discharge Plan:  [place]  In-House Referral:     Discharge planning Services  CM Consult  Post Acute Care Choice:  Home Health Choice offered to:  Patient  DME Arranged:  3-N-1 DME Agency:  [place]:  RN [facility] Agency:  [place]  Status of Service:  Completed, signed off  Medicare Important Message Given:  Yes Date Medicare IM Given:    Medicare IM give by:    Date Additional Medicare IM Given:    Additional Medicare Important Message give by:     If discussed at [place] of Stay Meetings, dates discussed:    Additional Comments:  Guido Sander, RN 09/12/2015, 10:45 AM

## 2015-09-12 NOTE — Progress Notes (Addendum)
      [address]       [place] [address]             [phone]      8 Days Post-Op Procedure(s) (LRB): VIDEO BRONCHOSCOPY WITH ENDOBRONCHIAL ULTRASOUND (N/A) VIDEO ASSISTED THORACOSCOPY (VATS)/WEDGE RESECTION (Right) LOBECTOMY (Right)   Subjective:  Mr. Acy has no complaints.  States he feels great and is ready to go home.  Objective: Vital signs in last 24 hours: Temp:  [97 F (36.1 C)-98.8 F (37.1 C)] 98.8 F (37.1 C) (12/17 0735) Pulse Rate:  [76-91] 83 (12/17 0735) Cardiac Rhythm:  [-] Normal sinus rhythm (12/17 0824) Resp:  [14-23] 17 (12/17 0735) BP: (104-138)/(65-96) 116/79 mmHg (12/17 0735) SpO2:  [96 %-100 %] 100 % (12/17 0902)  Intake/Output from previous day: 12/16 0701 - 12/17 0700 In: 1380 [P.O.:1380] Out: 780 [Urine:750; Chest Tube:30] Intake/Output this shift: Total I/O In: -  Out: 450 [Urine:450]  General appearance: alert, cooperative and no distress Heart: regular rate and rhythm Lungs: clear to auscultation bilaterally Abdomen: soft, non-tender; bowel sounds normal; no masses,  no organomegaly Wound: clean and dry  Lab Results: No results for input(s): WBC, HGB, HCT, PLT in the last 72 hours. BMET: No results for input(s): NA, K, CL, CO2, GLUCOSE, BUN, CREATININE, CALCIUM in the last 72 hours.  PT/INR: No results for input(s): LABPROT, INR in the last 72 hours. ABG    Component Value Date/Time   PHART 7.390 09/05/2015 0459   HCO3 26.1* 09/05/2015 0459   TCO2 27 09/05/2015 0459   ACIDBASEDEF 2.3* 08/26/2015 1133   O2SAT 98.0 09/05/2015 0459   CBG (last 3)  No results for input(s): GLUCAP in the last 72 hours.  Assessment/Plan: S/P Procedure(s) (LRB): VIDEO BRONCHOSCOPY WITH ENDOBRONCHIAL ULTRASOUND (N/A) VIDEO ASSISTED THORACOSCOPY (VATS)/WEDGE RESECTION (Right) LOBECTOMY (Right)  1. Chest tube- connected to [facility], CXR shows stable appearance of pneumothorax 2. CV- Tachycardia improved, continue  Coreg at 12.5 mg BID 3. Dispo- patient stable, will d/c home today with [facility]   LOS: 8 days    BARRETT, ERIN 09/12/2015  Patient seen and examined. He has extreme tidal variation but I don't see any definite air leak at present Given the intermittent nature of his air leak, I think it is safest for him to go home with tube I'll see him back in the office next week He is aware of the need to be extremely careful with the tube and has demonstrated cooperation throughout his stay  Wann. Roxan Hockey, MD [facility] [phone]

## 2015-09-12 NOTE — Progress Notes (Signed)
UR COMPLETED  

## 2015-09-12 NOTE — Progress Notes (Signed)
CM received VM from [facility] with [facility] stating that she was mistaken and can't take the patient as previously stated. She said that they are already doing calls all day for preexisting clients and there are no nurses available and no PRN nurses.   CM called [facility] per member's previous statement that he was okay with [facility] if [facility] Go could not provide needed care. CM spoke to [place] with [facility] and referral accepted. CM advised for patient to be seen tomorrow for daily Chest tube care. Cm called and updated nurse Olivia Mackie and information passed on to patient. CM placed updated information on AVS.

## 2015-09-12 NOTE — Discharge Instructions (Signed)
Thorascopic Surgery, Care After Refer to this sheet in the next few weeks. These instructions provide you with information on caring for yourself after your procedure. Your caregiver may also give you more specific instructions. Your procedure has been planned according to current medical practices, but problems sometimes occur. Call your caregiver if you have any problems or questions after your procedure. HOME CARE INSTRUCTIONS   Only take over-the-counter or prescription medications as directed.  Only take pain medications (narcotics) as directed.  Do not drive until your caregiver approves. Driving while taking narcotics or soon after surgery can be dangerous, so discuss the specific timing with your caregiver.  Avoid activities that use your chest muscles, such as lifting heavy objects, for at least 3-4 weeks.   Take deep breaths to expand the lungs and to protect against pneumonia.  Do breathing exercises as directed by your caregiver. If you were given an incentive spirometer to help with breathing, use it as directed.  You may resume a normal diet and activities when you feel you are able to or as directed.  Do not take a bath until your caregiver says it is OK. Use the shower instead.   Keep the bandage (dressing) covering the area where the chest tube was inserted (incision site) dry for 48 hours. After 48 hours, remove the dressing unless there is new drainage.  Remove dressings as directed by your caregiver.  Change dressings if necessary or as directed.  Keep all follow-up appointments. It is important for you to see your caregiver after surgery to discuss appropriate follow-up care and surveillance, if it is necessary. SEEK MEDICAL CARE:  You feel excessive or increasing pain at an incision site.  You notice bleeding, skin irritation, drainage, swelling, or redness at an incision site.  There is a bad smell coming from an incision or dressing.  It feels like your  heart is fluttering or beating rapidly.  Your pain medication does not relieve your pain. SEEK IMMEDIATE MEDICAL CARE IF:   You have a fever.   You have chest pain.  You have a rash.  You have shortness of breath.  You have trouble breathing.   You feel weak, lightheaded, dizzy, or faint.  MAKE SURE YOU:   Understand these instructions.   Will watch your condition.   Will get help right away if you are not doing well or get worse.   This information is not intended to replace advice given to you by your health care provider. Make sure you discuss any questions you have with your health care provider.   Document Released: 01/07/2013 Document Revised: 10/03/2014 Document Reviewed: 01/07/2013 Elsevier Interactive Patient Education [employer].

## 2015-09-13 DIAGNOSIS — C3411 Malignant neoplasm of upper lobe, right bronchus or lung: Secondary | ICD-10-CM | POA: Diagnosis not present

## 2015-09-13 DIAGNOSIS — Z720 Tobacco use: Secondary | ICD-10-CM | POA: Diagnosis not present

## 2015-09-13 DIAGNOSIS — B1910 Unspecified viral hepatitis B without hepatic coma: Secondary | ICD-10-CM | POA: Diagnosis not present

## 2015-09-13 DIAGNOSIS — E119 Type 2 diabetes mellitus without complications: Secondary | ICD-10-CM | POA: Diagnosis not present

## 2015-09-13 DIAGNOSIS — E785 Hyperlipidemia, unspecified: Secondary | ICD-10-CM | POA: Diagnosis not present

## 2015-09-13 DIAGNOSIS — K219 Gastro-esophageal reflux disease without esophagitis: Secondary | ICD-10-CM | POA: Diagnosis not present

## 2015-09-13 DIAGNOSIS — J45909 Unspecified asthma, uncomplicated: Secondary | ICD-10-CM | POA: Diagnosis not present

## 2015-09-13 DIAGNOSIS — F209 Schizophrenia, unspecified: Secondary | ICD-10-CM | POA: Diagnosis not present

## 2015-09-13 DIAGNOSIS — I739 Peripheral vascular disease, unspecified: Secondary | ICD-10-CM | POA: Diagnosis not present

## 2015-09-13 DIAGNOSIS — Z483 Aftercare following surgery for neoplasm: Secondary | ICD-10-CM | POA: Diagnosis not present

## 2015-09-13 DIAGNOSIS — F1010 Alcohol abuse, uncomplicated: Secondary | ICD-10-CM | POA: Diagnosis not present

## 2015-09-13 DIAGNOSIS — Z4682 Encounter for fitting and adjustment of non-vascular catheter: Secondary | ICD-10-CM | POA: Diagnosis not present

## 2015-09-14 ENCOUNTER — Other Ambulatory Visit: Payer: Self-pay | Admitting: Thoracic Surgery (Cardiothoracic Vascular Surgery)

## 2015-09-14 DIAGNOSIS — C3490 Malignant neoplasm of unspecified part of unspecified bronchus or lung: Secondary | ICD-10-CM

## 2015-09-15 ENCOUNTER — Encounter: Payer: Self-pay | Admitting: Internal Medicine

## 2015-09-15 ENCOUNTER — Telehealth: Payer: Self-pay

## 2015-09-15 ENCOUNTER — Other Ambulatory Visit: Payer: Self-pay | Admitting: Internal Medicine

## 2015-09-15 DIAGNOSIS — M545 Low back pain, unspecified: Secondary | ICD-10-CM

## 2015-09-15 DIAGNOSIS — M170 Bilateral primary osteoarthritis of knee: Secondary | ICD-10-CM

## 2015-09-15 DIAGNOSIS — G8929 Other chronic pain: Secondary | ICD-10-CM

## 2015-09-15 MED ORDER — HYDROCODONE-ACETAMINOPHEN 5-325 MG PO TABS: 1.0000 | ORAL_TABLET | Freq: Four times a day (QID) | ORAL | Status: DC | PRN

## 2015-09-15 NOTE — Telephone Encounter (Signed)
Stacey from [facility] called and stated that the last orders that we did for home O2 the pt did not qualify. She spoke to the pt and he stated that he needs it. A new order is needed to arrange this for the pt.   Informed that pt is currently seeing pulmonary and that Dr. Melvyn Novas would have more relevant information for the need to have home O2.

## 2015-09-15 NOTE — Telephone Encounter (Signed)
We need to have him come in for amb 02 sat and order ono RA because there is nothing in present record about needing / using 02

## 2015-09-15 NOTE — Telephone Encounter (Signed)
Stefannie we have no documentation of the patient being on oxygen.  Pt is needing to have a qualifying 02 walk in order to have insurance pay for his 02.  Please advise if this can be done through your office, or if this needs to be done through pulmonary.  Pt has only been seen once in our clinic. Thanks!

## 2015-09-16 DIAGNOSIS — Z4682 Encounter for fitting and adjustment of non-vascular catheter: Secondary | ICD-10-CM | POA: Diagnosis not present

## 2015-09-16 DIAGNOSIS — Z720 Tobacco use: Secondary | ICD-10-CM | POA: Diagnosis not present

## 2015-09-16 DIAGNOSIS — E119 Type 2 diabetes mellitus without complications: Secondary | ICD-10-CM | POA: Diagnosis not present

## 2015-09-16 DIAGNOSIS — F209 Schizophrenia, unspecified: Secondary | ICD-10-CM | POA: Diagnosis not present

## 2015-09-16 DIAGNOSIS — C3411 Malignant neoplasm of upper lobe, right bronchus or lung: Secondary | ICD-10-CM | POA: Diagnosis not present

## 2015-09-16 DIAGNOSIS — R269 Unspecified abnormalities of gait and mobility: Secondary | ICD-10-CM | POA: Diagnosis not present

## 2015-09-16 DIAGNOSIS — Z483 Aftercare following surgery for neoplasm: Secondary | ICD-10-CM | POA: Diagnosis not present

## 2015-09-16 DIAGNOSIS — E785 Hyperlipidemia, unspecified: Secondary | ICD-10-CM | POA: Diagnosis not present

## 2015-09-16 DIAGNOSIS — I739 Peripheral vascular disease, unspecified: Secondary | ICD-10-CM | POA: Diagnosis not present

## 2015-09-16 DIAGNOSIS — F1010 Alcohol abuse, uncomplicated: Secondary | ICD-10-CM | POA: Diagnosis not present

## 2015-09-16 DIAGNOSIS — B1910 Unspecified viral hepatitis B without hepatic coma: Secondary | ICD-10-CM | POA: Diagnosis not present

## 2015-09-16 DIAGNOSIS — J449 Chronic obstructive pulmonary disease, unspecified: Secondary | ICD-10-CM | POA: Diagnosis not present

## 2015-09-16 DIAGNOSIS — J45909 Unspecified asthma, uncomplicated: Secondary | ICD-10-CM | POA: Diagnosis not present

## 2015-09-16 DIAGNOSIS — K219 Gastro-esophageal reflux disease without esophagitis: Secondary | ICD-10-CM | POA: Diagnosis not present

## 2015-09-16 NOTE — Telephone Encounter (Signed)
Pt has only started seeing pulmonary. It was more of a question if you would sign given the appropriate OV documentation. If pt is able to come in for assessment. Pt has not had a recent enough visit for that issue for [place] to sign an order either.

## 2015-09-17 ENCOUNTER — Ambulatory Visit (INDEPENDENT_AMBULATORY_CARE_PROVIDER_SITE_OTHER): Payer: Self-pay | Admitting: Thoracic Surgery (Cardiothoracic Vascular Surgery)

## 2015-09-17 ENCOUNTER — Other Ambulatory Visit: Payer: Self-pay | Admitting: *Deleted

## 2015-09-17 ENCOUNTER — Ambulatory Visit
Admission: RE | Admit: 2015-09-17 | Discharge: 2015-09-17 | Disposition: A | Discharge status: Home / Self Care | Payer: Medicare Other | Source: Ambulatory Visit | Attending: Thoracic Surgery (Cardiothoracic Vascular Surgery) | Admitting: Thoracic Surgery (Cardiothoracic Vascular Surgery)

## 2015-09-17 ENCOUNTER — Encounter: Payer: Self-pay | Admitting: Thoracic Surgery (Cardiothoracic Vascular Surgery)

## 2015-09-17 VITALS — BP 117/77 | HR 120 | Resp 20 | Ht 75.0 in | Wt 188.0 lb

## 2015-09-17 DIAGNOSIS — Z4682 Encounter for fitting and adjustment of non-vascular catheter: Secondary | ICD-10-CM

## 2015-09-17 DIAGNOSIS — Z902 Acquired absence of lung [part of]: Secondary | ICD-10-CM

## 2015-09-17 DIAGNOSIS — J939 Pneumothorax, unspecified: Secondary | ICD-10-CM | POA: Diagnosis not present

## 2015-09-17 DIAGNOSIS — C3490 Malignant neoplasm of unspecified part of unspecified bronchus or lung: Secondary | ICD-10-CM

## 2015-09-17 DIAGNOSIS — R911 Solitary pulmonary nodule: Secondary | ICD-10-CM
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
## 2015-09-18 NOTE — Telephone Encounter (Signed)
RE: Pt has a form that needs to be signed in order to have Awake and Overnight Oximetry.  Spoke to [place] in Pulmonary - she informed me that pt may not be seen in a year unless he is having issues since the pt is following up post surgically with cardiothoracic. Dr. Melvyn Novas advised that amb O2 could be scheduled.   Spoke to pt - Pt stated that he is currently gasping for air at times. This occurs mostly at night. Confirmed with pt that he has an upcoming appt with Dr. Roxan Hockey (cardiothoracic). Informed pt that I would reach out to cardiothoracic to see if it was appropriate for them to sign for O2.   Spoke to [place] (after hours triage) - sending call message to cardiothoracic regarding same as above. (forwarding to Dr. Roxan Hockey)

## 2015-09-22 ENCOUNTER — Ambulatory Visit (INDEPENDENT_AMBULATORY_CARE_PROVIDER_SITE_OTHER): Payer: Self-pay | Admitting: Thoracic Surgery (Cardiothoracic Vascular Surgery)

## 2015-09-22 ENCOUNTER — Telehealth: Payer: Self-pay | Admitting: *Deleted

## 2015-09-22 ENCOUNTER — Encounter: Payer: Self-pay | Admitting: Thoracic Surgery (Cardiothoracic Vascular Surgery)

## 2015-09-22 ENCOUNTER — Other Ambulatory Visit: Payer: Self-pay | Admitting: Thoracic Surgery (Cardiothoracic Vascular Surgery)

## 2015-09-22 ENCOUNTER — Ambulatory Visit
Admission: RE | Admit: 2015-09-22 | Discharge: 2015-09-22 | Disposition: A | Discharge status: Home / Self Care | Payer: Medicare Other | Source: Ambulatory Visit | Attending: Thoracic Surgery (Cardiothoracic Vascular Surgery) | Admitting: Thoracic Surgery (Cardiothoracic Vascular Surgery)

## 2015-09-22 VITALS — BP 114/74 | HR 96 | Resp 20 | Ht 75.0 in | Wt 188.0 lb

## 2015-09-22 DIAGNOSIS — J939 Pneumothorax, unspecified: Secondary | ICD-10-CM | POA: Diagnosis not present

## 2015-09-22 DIAGNOSIS — C3490 Malignant neoplasm of unspecified part of unspecified bronchus or lung: Secondary | ICD-10-CM

## 2015-09-22 DIAGNOSIS — R918 Other nonspecific abnormal finding of lung field: Secondary | ICD-10-CM

## 2015-09-22 DIAGNOSIS — Z902 Acquired absence of lung [part of]: Secondary | ICD-10-CM
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
## 2015-09-22 NOTE — Telephone Encounter (Signed)
Oncology Nurse Navigator Documentation  Oncology Nurse Navigator Flowsheets 09/22/2015  Referral date to RadOnc/MedOnc 09/22/2015  Navigator Encounter Type Introductory phone call/I received a referral from Dr. Roxan Hockey today.  I called left vm message regarding appt on 10/08/15 arrive at 3:30.  I also left my name and phone number to call if needed.   Patient Visit Type Initial  Treatment Phase Other  Interventions Coordination of Care  Coordination of Care MD Appointments  Time Spent with Patient 15

## 2015-09-24 DIAGNOSIS — F1010 Alcohol abuse, uncomplicated: Secondary | ICD-10-CM | POA: Diagnosis not present

## 2015-09-24 DIAGNOSIS — Z720 Tobacco use: Secondary | ICD-10-CM | POA: Diagnosis not present

## 2015-09-24 DIAGNOSIS — E119 Type 2 diabetes mellitus without complications: Secondary | ICD-10-CM | POA: Diagnosis not present

## 2015-09-24 DIAGNOSIS — E785 Hyperlipidemia, unspecified: Secondary | ICD-10-CM | POA: Diagnosis not present

## 2015-09-24 DIAGNOSIS — Z4682 Encounter for fitting and adjustment of non-vascular catheter: Secondary | ICD-10-CM | POA: Diagnosis not present

## 2015-09-24 DIAGNOSIS — J45909 Unspecified asthma, uncomplicated: Secondary | ICD-10-CM | POA: Diagnosis not present

## 2015-09-24 DIAGNOSIS — B1910 Unspecified viral hepatitis B without hepatic coma: Secondary | ICD-10-CM | POA: Diagnosis not present

## 2015-09-24 DIAGNOSIS — F209 Schizophrenia, unspecified: Secondary | ICD-10-CM | POA: Diagnosis not present

## 2015-09-24 DIAGNOSIS — Z483 Aftercare following surgery for neoplasm: Secondary | ICD-10-CM | POA: Diagnosis not present

## 2015-09-24 DIAGNOSIS — I739 Peripheral vascular disease, unspecified: Secondary | ICD-10-CM | POA: Diagnosis not present

## 2015-09-24 DIAGNOSIS — C3411 Malignant neoplasm of upper lobe, right bronchus or lung: Secondary | ICD-10-CM | POA: Diagnosis not present

## 2015-09-24 DIAGNOSIS — K219 Gastro-esophageal reflux disease without esophagitis: Secondary | ICD-10-CM | POA: Diagnosis not present

## 2015-09-29 ENCOUNTER — Encounter: Payer: Self-pay | Admitting: Thoracic Surgery (Cardiothoracic Vascular Surgery)

## 2015-09-30 ENCOUNTER — Emergency Department (HOSPITAL_COMMUNITY)
Admission: EM | Admit: 2015-09-30 | Discharge: 2015-10-01 | Disposition: A | Discharge status: Home / Self Care | Payer: Medicare Other | Attending: Emergency Medicine | Admitting: Emergency Medicine

## 2015-09-30 ENCOUNTER — Encounter (HOSPITAL_COMMUNITY): Payer: Self-pay | Admitting: Emergency Medicine

## 2015-09-30 ENCOUNTER — Other Ambulatory Visit: Payer: Self-pay

## 2015-09-30 ENCOUNTER — Emergency Department (HOSPITAL_COMMUNITY): Payer: Medicare Other

## 2015-09-30 DIAGNOSIS — Z87828 Personal history of other (healed) physical injury and trauma: Secondary | ICD-10-CM | POA: Diagnosis not present

## 2015-09-30 DIAGNOSIS — Z7951 Long term (current) use of inhaled steroids: Secondary | ICD-10-CM | POA: Insufficient documentation

## 2015-09-30 DIAGNOSIS — Z85118 Personal history of other malignant neoplasm of bronchus and lung: Secondary | ICD-10-CM | POA: Insufficient documentation

## 2015-09-30 DIAGNOSIS — I509 Heart failure, unspecified: Secondary | ICD-10-CM | POA: Diagnosis not present

## 2015-09-30 DIAGNOSIS — Z98811 Dental restoration status: Secondary | ICD-10-CM | POA: Diagnosis not present

## 2015-09-30 DIAGNOSIS — F209 Schizophrenia, unspecified: Secondary | ICD-10-CM | POA: Diagnosis not present

## 2015-09-30 DIAGNOSIS — Z862 Personal history of diseases of the blood and blood-forming organs and certain disorders involving the immune mechanism: Secondary | ICD-10-CM | POA: Diagnosis not present

## 2015-09-30 DIAGNOSIS — Z87438 Personal history of other diseases of male genital organs: Secondary | ICD-10-CM | POA: Insufficient documentation

## 2015-09-30 DIAGNOSIS — J441 Chronic obstructive pulmonary disease with (acute) exacerbation: Secondary | ICD-10-CM | POA: Diagnosis not present

## 2015-09-30 DIAGNOSIS — Z8701 Personal history of pneumonia (recurrent): Secondary | ICD-10-CM | POA: Insufficient documentation

## 2015-09-30 DIAGNOSIS — Z9889 Other specified postprocedural states: Secondary | ICD-10-CM | POA: Insufficient documentation

## 2015-09-30 DIAGNOSIS — Z902 Acquired absence of lung [part of]: Secondary | ICD-10-CM | POA: Diagnosis not present

## 2015-09-30 DIAGNOSIS — Z973 Presence of spectacles and contact lenses: Secondary | ICD-10-CM | POA: Insufficient documentation

## 2015-09-30 DIAGNOSIS — K219 Gastro-esophageal reflux disease without esophagitis: Secondary | ICD-10-CM | POA: Diagnosis not present

## 2015-09-30 DIAGNOSIS — I10 Essential (primary) hypertension: Secondary | ICD-10-CM | POA: Diagnosis not present

## 2015-09-30 DIAGNOSIS — Z86010 Personal history of colonic polyps: Secondary | ICD-10-CM | POA: Insufficient documentation

## 2015-09-30 DIAGNOSIS — Z9861 Coronary angioplasty status: Secondary | ICD-10-CM | POA: Insufficient documentation

## 2015-09-30 DIAGNOSIS — R0602 Shortness of breath: Secondary | ICD-10-CM | POA: Diagnosis not present

## 2015-09-30 DIAGNOSIS — E119 Type 2 diabetes mellitus without complications: Secondary | ICD-10-CM | POA: Diagnosis not present

## 2015-09-30 DIAGNOSIS — Z8711 Personal history of peptic ulcer disease: Secondary | ICD-10-CM | POA: Insufficient documentation

## 2015-09-30 DIAGNOSIS — F17210 Nicotine dependence, cigarettes, uncomplicated: Secondary | ICD-10-CM | POA: Insufficient documentation

## 2015-09-30 DIAGNOSIS — R079 Chest pain, unspecified: Secondary | ICD-10-CM | POA: Diagnosis not present

## 2015-09-30 DIAGNOSIS — Z8739 Personal history of other diseases of the musculoskeletal system and connective tissue: Secondary | ICD-10-CM | POA: Diagnosis not present

## 2015-09-30 DIAGNOSIS — Z8619 Personal history of other infectious and parasitic diseases: Secondary | ICD-10-CM | POA: Diagnosis not present

## 2015-09-30 DIAGNOSIS — Z79899 Other long term (current) drug therapy: Secondary | ICD-10-CM | POA: Insufficient documentation

## 2015-09-30 DIAGNOSIS — R0789 Other chest pain: Secondary | ICD-10-CM | POA: Diagnosis not present

## 2015-09-30 LAB — CBC
HCT: 38.6 % — ABNORMAL LOW (ref 39.0–52.0)
Hemoglobin: 12.9 g/dL — ABNORMAL LOW (ref 13.0–17.0)
MCH: 29.6 pg (ref 26.0–34.0)
MCHC: 33.4 g/dL (ref 30.0–36.0)
MCV: 88.5 fL (ref 78.0–100.0)
Platelets: 201 10*3/uL (ref 150–400)
RBC: 4.36 MIL/uL (ref 4.22–5.81)
RDW: 13.7 % (ref 11.5–15.5)
WBC: 5.3 10*3/uL (ref 4.0–10.5)

## 2015-09-30 LAB — BASIC METABOLIC PANEL
ANION GAP: 12 (ref 5–15)
BUN: <5 mg/dL — ABNORMAL LOW (ref 6–20)
CALCIUM: 8.8 mg/dL — AB (ref 8.9–10.3)
CO2: 24 mmol/L (ref 22–32)
Chloride: 106 mmol/L (ref 101–111)
Creatinine, Ser: 0.61 mg/dL (ref 0.61–1.24)
Glucose, Bld: 103 mg/dL — ABNORMAL HIGH (ref 65–99)
Potassium: 4 mmol/L (ref 3.5–5.1)
SODIUM: 142 mmol/L (ref 135–145)

## 2015-09-30 LAB — I-STAT TROPONIN, ED: Troponin i, poc: 0 ng/mL (ref 0.00–0.08)

## 2015-09-30 NOTE — ED Notes (Signed)
Patient transported to X-ray, in nad

## 2015-09-30 NOTE — ED Notes (Signed)
Per EMS: pt from home for eval of right sided cp with radiation to shoulder blades x2 hours. Pt with recent RLL lobectomy x2 weeks ago, no signs of infection to the site. Pt endorse no n/v/d or fevers. Pt admits to still smoking 1.5 packs a day and admits to 8 beers consumed today. nad noted.
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
## 2015-10-01 ENCOUNTER — Emergency Department (HOSPITAL_COMMUNITY): Payer: Medicare Other

## 2015-10-01 ENCOUNTER — Telehealth: Payer: Self-pay | Admitting: *Deleted

## 2015-10-01 ENCOUNTER — Encounter (HOSPITAL_COMMUNITY): Payer: Self-pay

## 2015-10-01 DIAGNOSIS — R0602 Shortness of breath: Secondary | ICD-10-CM | POA: Diagnosis not present

## 2015-10-01 DIAGNOSIS — R079 Chest pain, unspecified: Secondary | ICD-10-CM | POA: Diagnosis not present

## 2015-10-01 DIAGNOSIS — Z483 Aftercare following surgery for neoplasm: Secondary | ICD-10-CM | POA: Diagnosis not present

## 2015-10-01 DIAGNOSIS — J45909 Unspecified asthma, uncomplicated: Secondary | ICD-10-CM | POA: Diagnosis not present

## 2015-10-01 DIAGNOSIS — I739 Peripheral vascular disease, unspecified: Secondary | ICD-10-CM | POA: Diagnosis not present

## 2015-10-01 DIAGNOSIS — E119 Type 2 diabetes mellitus without complications: Secondary | ICD-10-CM | POA: Diagnosis not present

## 2015-10-01 DIAGNOSIS — C3411 Malignant neoplasm of upper lobe, right bronchus or lung: Secondary | ICD-10-CM | POA: Diagnosis not present

## 2015-10-01 DIAGNOSIS — Z4682 Encounter for fitting and adjustment of non-vascular catheter: Secondary | ICD-10-CM | POA: Diagnosis not present

## 2015-10-01 DIAGNOSIS — Z720 Tobacco use: Secondary | ICD-10-CM | POA: Diagnosis not present

## 2015-10-01 DIAGNOSIS — E785 Hyperlipidemia, unspecified: Secondary | ICD-10-CM | POA: Diagnosis not present

## 2015-10-01 DIAGNOSIS — K219 Gastro-esophageal reflux disease without esophagitis: Secondary | ICD-10-CM | POA: Diagnosis not present

## 2015-10-01 MED ORDER — IOHEXOL 350 MG/ML SOLN
100.0000 mL | Freq: Once | INTRAVENOUS | Status: AC | PRN
  Administered 2015-10-01: 70 mL via INTRAVENOUS

## 2015-10-01 NOTE — ED Notes (Signed)
Pt called out out requesting a breathing treatment "or something".

## 2015-10-01 NOTE — Discharge Instructions (Signed)
Continue taking her medications as prescribed. Follow-up with your primary care provider in 2 days. Return to the emergency department if symptoms worsen or new onset of fever, coughing up blood, wheezing, difficult to breathing, chest pain, abdominal pain, vomiting.

## 2015-10-01 NOTE — ED Notes (Signed)
Patient transported to CT 

## 2015-10-01 NOTE — Telephone Encounter (Signed)
Oncology Nurse Navigator Documentation  Oncology Nurse Navigator Flowsheets 10/01/2015  Navigator Encounter Type Telephone/I called to follow up with patient.  He had been in the hospital recently with CP.  I asked how he was feeling.  He stated he was feeling fine.  I also changed his appt form 10/08/15 to 10/15/15.  He verbalized understanding of appt time and place.   Patient Visit Type Follow-up  Treatment Phase -  Interventions Coordination of Care  Coordination of Care Appts  Time Spent with Patient 15

## 2015-10-05 ENCOUNTER — Ambulatory Visit: Payer: Self-pay | Admitting: Internal Medicine

## 2015-10-06 ENCOUNTER — Telehealth: Payer: Self-pay | Admitting: Internal Medicine

## 2015-10-06 ENCOUNTER — Encounter: Payer: Self-pay | Admitting: Thoracic Surgery (Cardiothoracic Vascular Surgery)

## 2015-10-06 ENCOUNTER — Other Ambulatory Visit: Payer: Self-pay | Admitting: Internal Medicine

## 2015-10-06 ENCOUNTER — Telehealth: Payer: Self-pay | Admitting: *Deleted

## 2015-10-06 NOTE — Telephone Encounter (Signed)
Submitted PA for [facility] thru CMM. [employer] Payment Pt ID: [id number]  Pharmacy: [facility] [phone]

## 2015-10-06 NOTE — Telephone Encounter (Signed)
Stacy from [facility] home called following up a fax she has sent twice for a request for an overnight pulse ox She can be reached at [phone] She talked with Colletta [place] yesterday who told her to refax it

## 2015-10-07 ENCOUNTER — Ambulatory Visit: Payer: Self-pay | Admitting: Internal Medicine

## 2015-10-07 DIAGNOSIS — Z4682 Encounter for fitting and adjustment of non-vascular catheter: Secondary | ICD-10-CM | POA: Diagnosis not present

## 2015-10-07 DIAGNOSIS — E119 Type 2 diabetes mellitus without complications: Secondary | ICD-10-CM | POA: Diagnosis not present

## 2015-10-07 DIAGNOSIS — C3411 Malignant neoplasm of upper lobe, right bronchus or lung: Secondary | ICD-10-CM | POA: Diagnosis not present

## 2015-10-07 DIAGNOSIS — E785 Hyperlipidemia, unspecified: Secondary | ICD-10-CM | POA: Diagnosis not present

## 2015-10-07 DIAGNOSIS — I739 Peripheral vascular disease, unspecified: Secondary | ICD-10-CM | POA: Diagnosis not present

## 2015-10-07 DIAGNOSIS — Z483 Aftercare following surgery for neoplasm: Secondary | ICD-10-CM | POA: Diagnosis not present

## 2015-10-07 DIAGNOSIS — Z720 Tobacco use: Secondary | ICD-10-CM | POA: Diagnosis not present

## 2015-10-07 DIAGNOSIS — K219 Gastro-esophageal reflux disease without esophagitis: Secondary | ICD-10-CM | POA: Diagnosis not present

## 2015-10-07 DIAGNOSIS — J45909 Unspecified asthma, uncomplicated: Secondary | ICD-10-CM | POA: Diagnosis not present

## 2015-10-07 MED ORDER — BUDESONIDE-FORMOTEROL FUMARATE 80-4.5 MCG/ACT IN AERO: 2.0000 | INHALATION_SPRAY | Freq: Two times a day (BID) | RESPIRATORY_TRACT | Status: DC

## 2015-10-07 NOTE — Telephone Encounter (Signed)
Checked CMM and PA still in process.

## 2015-10-07 NOTE — Telephone Encounter (Signed)
Same drug as symbicort 80 2bid and if he has Pharmacist, community we have zero coupons so better choice anyay

## 2015-10-07 NOTE — Telephone Encounter (Signed)
Received fax that insurance denied Dulera. No alternatives given.

## 2015-10-07 NOTE — Telephone Encounter (Signed)
LMOVM so I can get form refaxed. Steffanie out of office

## 2015-10-07 NOTE — Telephone Encounter (Signed)
Called and spoke with patient. Informed him of the below and verified pharmacy as [facility]. Pt voiced understanding and had no further questions.

## 2015-10-08 ENCOUNTER — Ambulatory Visit: Payer: Self-pay | Admitting: Internal Medicine

## 2015-10-12 ENCOUNTER — Ambulatory Visit (INDEPENDENT_AMBULATORY_CARE_PROVIDER_SITE_OTHER): Payer: Medicare Other | Admitting: Internal Medicine

## 2015-10-12 ENCOUNTER — Encounter: Payer: Self-pay | Admitting: Internal Medicine

## 2015-10-12 VITALS — BP 110/80 | HR 80 | Temp 98.2°F | Resp 16 | Ht 75.0 in | Wt 187.0 lb

## 2015-10-12 DIAGNOSIS — Z23 Encounter for immunization: Secondary | ICD-10-CM

## 2015-10-12 DIAGNOSIS — M170 Bilateral primary osteoarthritis of knee: Secondary | ICD-10-CM | POA: Diagnosis not present

## 2015-10-12 DIAGNOSIS — I10 Essential (primary) hypertension: Secondary | ICD-10-CM

## 2015-10-12 DIAGNOSIS — C3491 Malignant neoplasm of unspecified part of right bronchus or lung: Secondary | ICD-10-CM

## 2015-10-12 DIAGNOSIS — G8929 Other chronic pain: Secondary | ICD-10-CM

## 2015-10-12 DIAGNOSIS — M545 Low back pain, unspecified: Secondary | ICD-10-CM

## 2015-10-12 DIAGNOSIS — C3490 Malignant neoplasm of unspecified part of unspecified bronchus or lung: Secondary | ICD-10-CM | POA: Insufficient documentation

## 2015-10-12 MED ORDER — HYDROCODONE-ACETAMINOPHEN 10-325 MG PO TABS: 1.0000 | ORAL_TABLET | Freq: Three times a day (TID) | ORAL | Status: DC | PRN

## 2015-10-12 NOTE — Progress Notes (Signed)
Subjective:  Patient ID: [name], male    DOB: September 01, 1961  Age: 55 y.o. MRN: [id number]  CC: Osteoarthritis and Back Pain   HPI [name] presents for f/up on pain management, he complains of worsening arthritis pain in his knees, hips, and shoulders over the last few months. He requests a higher dose or hydrocodone.  Outpatient Prescriptions Prior to Visit  Medication Sig Dispense Refill  . carvedilol (COREG) 12.5 MG tablet take 1 tablet by mouth twice a day with meals 180 tablet 3  . esomeprazole (NEXIUM) 40 MG capsule AS DIRECTED. (Patient taking differently: TAKE 1 TABLET IN THE MORNING) 30 capsule 11  . guaiFENesin (MUCINEX) 600 MG 12 hr tablet Take 1 tablet (600 mg total) by mouth 2 (two) times daily as needed.    Marland Kitchen LIVALO 2 MG TABS TAKE 1 TABLET BY MOUTH EVERY DAY. 30 tablet 11  . mometasone-formoterol (DULERA) 100-5 MCG/ACT AERO Take 2 puffs first thing in am and then another 2 puffs about 12 hours later. 1 Inhaler 11  . PARoxetine (PAXIL) 30 MG tablet TAKE 1 TABLET BY MOUTH EVERY MORNING. 90 tablet 3  . PROAIR HFA 108 (90 Base) MCG/ACT inhaler INHALE 1-2 PUFFS BY MOUTH EVERY SIX HOURS FOR wheezing AND SHORTNESS OF BREATH 8.5 g 11  . QUEtiapine (SEROQUEL) 100 MG tablet Take 1 tablet (100 mg total) by mouth at bedtime. 90 tablet 3  . traZODone (DESYREL) 100 MG tablet TAKE THREE TABLETS BY MOUTH AT BEDTIME 270 tablet 3  . TRUVADA 200-300 MG tablet TAKE 1 TABLET BY MOUTH DAILY. 90 tablet 1  . HYDROcodone-acetaminophen (NORCO/VICODIN) 5-325 MG tablet Take 1 tablet by mouth every 6 (six) hours as needed for moderate pain. 90 tablet 0  . budesonide-formoterol (SYMBICORT) 80-4.5 MCG/ACT inhaler Inhale 2 puffs into the lungs 2 (two) times daily. (Patient not taking: Reported on 10/12/2015) 1 Inhaler 5  . SEROQUEL XR 50 MG TB24 24 hr tablet TAKE ONE TABLET BY MOUTH AT NIGHT 90 tablet 3   No facility-administered medications prior to visit.    ROS Review of Systems    Constitutional: Negative.  Negative for fever, chills, diaphoresis, appetite change and fatigue.  HENT: Negative.   Eyes: Negative.   Respiratory: Negative.  Negative for cough, choking, chest tightness, shortness of breath and stridor.   Cardiovascular: Negative.  Negative for chest pain, palpitations and leg swelling.  Gastrointestinal: Negative.  Negative for nausea, vomiting, abdominal pain, diarrhea, constipation and blood in stool.  Endocrine: Negative.   Genitourinary: Negative.   Musculoskeletal: Positive for back pain and arthralgias. Negative for myalgias, joint swelling, gait problem and neck pain.  Skin: Negative.  Negative for color change and rash.  Allergic/Immunologic: Negative.   Neurological: Negative.   Hematological: Negative.  Negative for adenopathy. Does not bruise/bleed easily.  Psychiatric/Behavioral: Negative.  Negative for suicidal ideas, hallucinations, behavioral problems, sleep disturbance, self-injury, dysphoric mood, decreased concentration and agitation. The patient is not nervous/anxious.     Objective:  BP 110/80 mmHg  Pulse 80  Temp(Src) 98.2 F (36.8 C) (Oral)  Resp 16  Ht '6\' 3"'$  (1.905 m)  Wt 187 lb (84.823 kg)  BMI 23.37 kg/m2  SpO2 98%  BP Readings from Last 3 Encounters:  10/12/15 110/80  10/01/15 110/62  09/22/15 114/74    Wt Readings from Last 3 Encounters:  10/12/15 187 lb (84.823 kg)  09/30/15 188 lb (85.276 kg)  09/22/15 188 lb (85.276 kg)    Physical Exam  Constitutional: He  is oriented to person, place, and time. He appears well-developed and well-nourished. No distress.  HENT:  Head: Normocephalic and atraumatic.  Mouth/Throat: Oropharynx is clear and moist. No oropharyngeal exudate.  Eyes: Conjunctivae are normal. Right eye exhibits no discharge. Left eye exhibits no discharge. No scleral icterus.  Neck: Normal range of motion. Neck supple. No JVD present. No tracheal deviation present. No thyromegaly present.   Cardiovascular: Normal rate, regular rhythm, normal heart sounds and intact distal pulses.  Exam reveals no gallop and no friction rub.   No murmur heard. Pulmonary/Chest: Effort normal and breath sounds normal. No stridor. No respiratory distress. He has no wheezes. He has no rales. He exhibits no tenderness.  Abdominal: Soft. Bowel sounds are normal. He exhibits no distension and no mass. There is no tenderness. There is no rebound and no guarding.  Musculoskeletal: Normal range of motion. He exhibits no edema or tenderness.  Lymphadenopathy:    He has no cervical adenopathy.  Neurological: He is oriented to person, place, and time.  Skin: Skin is warm and dry. No rash noted. He is not diaphoretic. No erythema. No pallor.  Vitals reviewed.   Lab Results  Component Value Date   WBC 5.3 09/30/2015   HGB 12.9* 09/30/2015   HCT 38.6* 09/30/2015   PLT 201 09/30/2015   GLUCOSE 103* 09/30/2015   CHOL 134 08/11/2015   TRIG 78.0 08/11/2015   HDL 38.90* 08/11/2015   LDLDIRECT 71.2 09/29/2011   LDLCALC 79 08/11/2015   ALT 17 09/06/2015   AST 19 09/06/2015   NA 142 09/30/2015   K 4.0 09/30/2015   CL 106 09/30/2015   CREATININE 0.61 09/30/2015   BUN <5* 09/30/2015   CO2 24 09/30/2015   TSH 1.14 08/11/2015   PSA 0.69 12/05/2013   INR 1.03 08/26/2015   HGBA1C 5.6 08/11/2015   MICROALBUR 0.2 10/08/2008    Dg Chest 2 View  09/30/2015  CLINICAL DATA:  Or RIGHT lung biopsy 2 weeks ago. RIGHT-sided chest pain EXAM: CHEST  2 VIEW COMPARISON:  09/22/2015 FINDINGS: Normal cardiac silhouette. Persistent hydro pneumothorax on the RIGHT. The air-fluid level is 49 mm from the apical chest wall slightly reduced from 55 mm on prior. Pleural edge is approximately 30 mm from the chest wall compared to 28 mm. There is volume loss in the RIGHT lung base. LEFT lung is clear. IMPRESSION: Persistent hydropneumothorax on the RIGHT with no significant change. Electronically Signed   By: Suzy Bouchard M.D.    On: 09/30/2015 22:14   Ct Angio Chest Pe W/cm &/or Wo Cm  10/01/2015  CLINICAL DATA:  Chest pain and shortness of breath. Right lower lobectomy 2 weeks prior. EXAM: CT ANGIOGRAPHY CHEST WITH CONTRAST TECHNIQUE: Multidetector CT imaging of the chest was performed using the standard protocol during bolus administration of intravenous contrast. Multiplanar CT image reconstructions and MIPs were obtained to evaluate the vascular anatomy. CONTRAST:  23m OMNIPAQUE IOHEXOL 350 MG/ML SOLN COMPARISON:  Most recent radiographs 3 hours prior. Multiple prior radiographs reviewed. FINDINGS: There are no filling defects within the pulmonary arteries to suggest pulmonary embolus. Post right upper lobectomy. Loculated small to moderate right apical hydro pneumothorax is again seen. This is stable in size from prior radiographs. Linear opacities in the anterior right lower lobe, likely related scarring are unchanged. There is no mediastinal shift. Mild emphysematous change in the lungs, left lung is otherwise clear. Bronchial thickening noted to the lower lobes. No consolidation. No left pleural effusion. Thoracic aorta is normal  in caliber. There are scattered coronary artery calcifications. Heart is at the upper limits of normal in size. Small mediastinal lymph nodes are unchanged from prior PET-CT. There is no hilar adenopathy. No pericardial effusion. No acute abnormality in the included upper abdomen. There are no acute or suspicious osseous abnormalities. Review of the MIP images confirms the above findings. IMPRESSION: 1. No pulmonary embolus. 2. Post recent right upper lobectomy with stable right apical hydropneumothorax from prior radiographs. 3. Lower lobe bronchial thickening.  Mild emphysema. Electronically Signed   By: Jeb Levering M.D.   On: 10/01/2015 01:23    Assessment & Plan:   Ayodeji was seen today for osteoarthritis and back pain.  Diagnoses and all orders for this visit:  Need for 23-polyvalent  pneumococcal polysaccharide vaccine -     Pneumococcal polysaccharide vaccine 23-valent greater than or equal to 2yo subcutaneous/IM  Chronic low back pain without sciatica, unspecified back pain laterality -     HYDROcodone-acetaminophen (NORCO) 10-325 MG tablet; Take 1 tablet by mouth every 8 (eight) hours as needed.  HYPERTENSION, BENIGN- his BP is well controlled  Primary osteoarthritis of both knees -     HYDROcodone-acetaminophen (NORCO) 10-325 MG tablet; Take 1 tablet by mouth every 8 (eight) hours as needed.  Non-small cell carcinoma of lung, stage 1, right [facility])- he sees oncology later this week   I have discontinued Mr. Eisler HYDROcodone-acetaminophen, SEROQUEL XR, and budesonide-formoterol. I am also having him start on HYDROcodone-acetaminophen. Additionally, I am having him maintain his carvedilol, esomeprazole, TRUVADA, PARoxetine, mometasone-formoterol, QUEtiapine, LIVALO, guaiFENesin, PROAIR HFA, and traZODone.  Meds ordered this encounter  Medications  . HYDROcodone-acetaminophen (NORCO) 10-325 MG tablet    Sig: Take 1 tablet by mouth every 8 (eight) hours as needed.    Dispense:  90 tablet    Refill:  0     Follow-up: Return if symptoms worsen or fail to improve.  Scarlette Calico, MD

## 2015-10-12 NOTE — Patient Instructions (Signed)

## 2015-10-12 NOTE — Telephone Encounter (Signed)
Pt stated in Ov that he has not qualified for this in past and feels that if he needed it his MD after surgery would of pushed for it. Pt did seem SOB during visit today but was told it would be that way after surgery for a short while

## 2015-10-12 NOTE — Progress Notes (Signed)
Pre visit review using our clinic review tool, if applicable. No additional management support is needed unless otherwise documented below in the visit note. 

## 2015-10-12 NOTE — Telephone Encounter (Signed)
Stacey with [facility] called. The are looking for the overnight pulse ox. This procedure has not been performed. Pt is followed by pulmonary and cardiothoracic surg. PCP has the form for now.  Pt has an appt today.

## 2015-10-15 ENCOUNTER — Other Ambulatory Visit: Payer: Self-pay | Admitting: Medical Oncology

## 2015-10-15 ENCOUNTER — Ambulatory Visit (HOSPITAL_BASED_OUTPATIENT_CLINIC_OR_DEPARTMENT_OTHER): Payer: Medicare Other | Admitting: Internal Medicine

## 2015-10-15 ENCOUNTER — Encounter: Payer: Self-pay | Admitting: *Deleted

## 2015-10-15 ENCOUNTER — Other Ambulatory Visit (HOSPITAL_BASED_OUTPATIENT_CLINIC_OR_DEPARTMENT_OTHER): Payer: Medicare Other

## 2015-10-15 ENCOUNTER — Encounter: Payer: Self-pay | Admitting: Internal Medicine

## 2015-10-15 ENCOUNTER — Ambulatory Visit: Payer: Medicare Other | Attending: Internal Medicine | Admitting: Physical Therapy

## 2015-10-15 ENCOUNTER — Telehealth: Payer: Self-pay | Admitting: Internal Medicine

## 2015-10-15 VITALS — BP 121/65 | HR 83 | Temp 98.2°F | Resp 18 | Ht 75.0 in | Wt 190.0 lb

## 2015-10-15 DIAGNOSIS — R5381 Other malaise: Secondary | ICD-10-CM | POA: Diagnosis not present

## 2015-10-15 DIAGNOSIS — R918 Other nonspecific abnormal finding of lung field: Secondary | ICD-10-CM

## 2015-10-15 DIAGNOSIS — R0602 Shortness of breath: Secondary | ICD-10-CM | POA: Insufficient documentation

## 2015-10-15 DIAGNOSIS — Z7409 Other reduced mobility: Secondary | ICD-10-CM

## 2015-10-15 DIAGNOSIS — C3411 Malignant neoplasm of upper lobe, right bronchus or lung: Secondary | ICD-10-CM

## 2015-10-15 DIAGNOSIS — M2550 Pain in unspecified joint: Secondary | ICD-10-CM | POA: Diagnosis not present

## 2015-10-15 DIAGNOSIS — Z9181 History of falling: Secondary | ICD-10-CM

## 2015-10-15 DIAGNOSIS — C3491 Malignant neoplasm of unspecified part of right bronchus or lung: Secondary | ICD-10-CM

## 2015-10-15 DIAGNOSIS — R269 Unspecified abnormalities of gait and mobility: Secondary | ICD-10-CM | POA: Diagnosis not present

## 2015-10-15 DIAGNOSIS — J449 Chronic obstructive pulmonary disease, unspecified: Secondary | ICD-10-CM | POA: Diagnosis not present

## 2015-10-15 LAB — COMPREHENSIVE METABOLIC PANEL
ALBUMIN: 3.4 g/dL — AB (ref 3.5–5.0)
ALK PHOS: 79 U/L (ref 40–150)
ALT: 15 U/L (ref 0–55)
AST: 15 U/L (ref 5–34)
Anion Gap: 7 mEq/L (ref 3–11)
BUN: 8.4 mg/dL (ref 7.0–26.0)
CALCIUM: 8.8 mg/dL (ref 8.4–10.4)
CO2: 23 mEq/L (ref 22–29)
Chloride: 112 mEq/L — ABNORMAL HIGH (ref 98–109)
Creatinine: 0.9 mg/dL (ref 0.7–1.3)
Glucose: 91 mg/dl (ref 70–140)
POTASSIUM: 4.2 meq/L (ref 3.5–5.1)
Sodium: 141 mEq/L (ref 136–145)
TOTAL PROTEIN: 7.1 g/dL (ref 6.4–8.3)

## 2015-10-15 LAB — CBC WITH DIFFERENTIAL/PLATELET
BASO%: 0.7 % (ref 0.0–2.0)
BASOS ABS: 0.1 10*3/uL (ref 0.0–0.1)
EOS ABS: 0.1 10*3/uL (ref 0.0–0.5)
EOS%: 1 % (ref 0.0–7.0)
HEMATOCRIT: 37.9 % — AB (ref 38.4–49.9)
HEMOGLOBIN: 12.2 g/dL — AB (ref 13.0–17.1)
LYMPH#: 1.8 10*3/uL (ref 0.9–3.3)
LYMPH%: 24.9 % (ref 14.0–49.0)
MCH: 27.9 pg (ref 27.2–33.4)
MCHC: 32.1 g/dL (ref 32.0–36.0)
MCV: 86.7 fL (ref 79.3–98.0)
MONO#: 0.8 10*3/uL (ref 0.1–0.9)
MONO%: 10.9 % (ref 0.0–14.0)
NEUT#: 4.5 10*3/uL (ref 1.5–6.5)
NEUT%: 62.5 % (ref 39.0–75.0)
PLATELETS: 155 10*3/uL (ref 140–400)
RBC: 4.37 10*6/uL (ref 4.20–5.82)
RDW: 14.7 % — AB (ref 11.0–14.6)
WBC: 7.2 10*3/uL (ref 4.0–10.3)
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
## 2015-10-15 NOTE — Patient Instructions (Signed)
Smoking Cessation, Tips for Success If you are ready to quit smoking, congratulations! You have chosen to help yourself be healthier. Cigarettes bring nicotine, tar, carbon monoxide, and other irritants into your body. Your lungs, heart, and blood vessels will be able to work better without these poisons. There are many different ways to quit smoking. Nicotine gum, nicotine patches, a nicotine inhaler, or nicotine nasal spray can help with physical craving. Hypnosis, support groups, and medicines help break the habit of smoking. WHAT THINGS CAN I DO TO MAKE QUITTING EASIER?  Here are some tips to help you quit for good:  Pick a date when you will quit smoking completely. Tell all of your friends and family about your plan to quit on that date.  Do not try to slowly cut down on the number of cigarettes you are smoking. Pick a quit date and quit smoking completely starting on that day.  Throw away all cigarettes.   Clean and remove all ashtrays from your home, work, and car.  On a card, write down your reasons for quitting. Carry the card with you and read it when you get the urge to smoke.  Cleanse your body of nicotine. Drink enough water and fluids to keep your urine clear or pale yellow. Do this after quitting to flush the nicotine from your body.  Learn to predict your moods. Do not let a bad situation be your excuse to have a cigarette. Some situations in your life might tempt you into wanting a cigarette.  Never have "just one" cigarette. It leads to wanting another and another. Remind yourself of your decision to quit.  Change habits associated with smoking. If you smoked while driving or when feeling stressed, try other activities to replace smoking. Stand up when drinking your coffee. Brush your teeth after eating. Sit in a different chair when you read the paper. Avoid alcohol while trying to quit, and try to drink fewer caffeinated beverages. Alcohol and caffeine may urge you to  smoke.  Avoid foods and drinks that can trigger a desire to smoke, such as sugary or spicy foods and alcohol.  Ask people who smoke not to smoke around you.  Have something planned to do right after eating or having a cup of coffee. For example, plan to take a walk or exercise.  Try a relaxation exercise to calm you down and decrease your stress. Remember, you may be tense and nervous for the first 2 weeks after you quit, but this will pass.  Find new activities to keep your hands busy. Play with a pen, coin, or rubber band. Doodle or draw things on paper.  Brush your teeth right after eating. This will help cut down on the craving for the taste of tobacco after meals. You can also try mouthwash.   Use oral substitutes in place of cigarettes. Try using lemon drops, carrots, cinnamon sticks, or chewing gum. Keep them handy so they are available when you have the urge to smoke.  When you have the urge to smoke, try deep breathing.  Designate your home as a nonsmoking area.  If you are a heavy smoker, ask your health care provider about a prescription for nicotine chewing gum. It can ease your withdrawal from nicotine.  Reward yourself. Set aside the cigarette money you save and buy yourself something nice.  Look for support from others. Join a support group or smoking cessation program. Ask someone at home or at work to help you with your plan   to quit smoking.  Always ask yourself, "Do I need this cigarette or is this just a reflex?" Tell yourself, "Today, I choose not to smoke," or "I do not want to smoke." You are reminding yourself of your decision to quit.  Do not replace cigarette smoking with electronic cigarettes (commonly called e-cigarettes). The safety of e-cigarettes is unknown, and some may contain harmful chemicals.  If you relapse, do not give up! Plan ahead and think about what you will do the next time you get the urge to smoke. HOW WILL I FEEL WHEN I QUIT SMOKING? You  may have symptoms of withdrawal because your body is used to nicotine (the addictive substance in cigarettes). You may crave cigarettes, be irritable, feel very hungry, cough often, get headaches, or have difficulty concentrating. The withdrawal symptoms are only temporary. They are strongest when you first quit but will go away within 10-14 days. When withdrawal symptoms occur, stay in control. Think about your reasons for quitting. Remind yourself that these are signs that your body is healing and getting used to being without cigarettes. Remember that withdrawal symptoms are easier to treat than the major diseases that smoking can cause.  Even after the withdrawal is over, expect periodic urges to smoke. However, these cravings are generally short lived and will go away whether you smoke or not. Do not smoke! WHAT RESOURCES ARE AVAILABLE TO HELP ME QUIT SMOKING? Your health care provider can direct you to community resources or hospitals for support, which may include:  Group support.  Education.  Hypnosis.  Therapy.   This information is not intended to replace advice given to you by your health care provider. Make sure you discuss any questions you have with your health care provider.   Document Released: 06/10/2004 Document Revised: 10/03/2014 Document Reviewed: 02/28/2013 Elsevier Interactive Patient Education 2016 [employer] Inc[employer]  

## 2015-10-15 NOTE — Telephone Encounter (Signed)
Gv pt appts for July and advised c-sched will call with mri and ct.

## 2015-10-15 NOTE — Progress Notes (Signed)
Rollator walker faxed to [facility]

## 2015-10-15 NOTE — Progress Notes (Signed)
Ronnie Clinical Social Work  Clinical Social Work met with patient and Futures trader at [employer] appointment to offer support and assess for psychosocial needs.  Medical oncologist reviewed patient's diagnosis and recommended patient receive scan every six months after surgery for observation.  The patient was in agreement.  [name] lives alone.  He reported he has adequate support and has no concerns at this time.  He shared he has "not been worried about this cancer all along".  Patient indicated he relied strongly on his faith and was "at peace with whatever God had planned".   Clinical Social Work briefly discussed Clinical Social Work role and [employer] support programs/services.  Clinical Social Work encouraged patient to call with any additional questions or concerns.   [name], MSW, LCSW, OSW-C Clinical Social Worker [facility] [phone]
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
## 2015-10-16 ENCOUNTER — Encounter: Payer: Self-pay | Admitting: *Deleted

## 2015-10-16 DIAGNOSIS — E119 Type 2 diabetes mellitus without complications: Secondary | ICD-10-CM | POA: Diagnosis not present

## 2015-10-16 DIAGNOSIS — Z720 Tobacco use: Secondary | ICD-10-CM | POA: Diagnosis not present

## 2015-10-16 DIAGNOSIS — I739 Peripheral vascular disease, unspecified: Secondary | ICD-10-CM | POA: Diagnosis not present

## 2015-10-16 DIAGNOSIS — J45909 Unspecified asthma, uncomplicated: Secondary | ICD-10-CM | POA: Diagnosis not present

## 2015-10-16 DIAGNOSIS — Z483 Aftercare following surgery for neoplasm: Secondary | ICD-10-CM | POA: Diagnosis not present

## 2015-10-16 DIAGNOSIS — C3411 Malignant neoplasm of upper lobe, right bronchus or lung: Secondary | ICD-10-CM | POA: Diagnosis not present

## 2015-10-16 DIAGNOSIS — K219 Gastro-esophageal reflux disease without esophagitis: Secondary | ICD-10-CM | POA: Diagnosis not present

## 2015-10-16 DIAGNOSIS — E785 Hyperlipidemia, unspecified: Secondary | ICD-10-CM | POA: Diagnosis not present

## 2015-10-16 DIAGNOSIS — Z4682 Encounter for fitting and adjustment of non-vascular catheter: Secondary | ICD-10-CM | POA: Diagnosis not present

## 2015-10-16 NOTE — Progress Notes (Signed)
   Thoracic Treatment Summary Name:Taytum XACHARY HAMBLY Date:10/16/2015 DOB:1961/07/12 Your Medical Team Medical Oncologist:Dr. Julien Nordmann Radiation Oncologist: Pulmonologist:  Surgeon:Dr. Roxan Hockey Type and Stage of Lung Cancer Non-Small Cell Carcinoma: Adenocarcinoma  Clinical Stage: pT1a,pN0   Clinical stage is based on radiology exams.  Pathological stage will be determined after surgery.  Staging is based on the size of the tumor, involvement of lymph nodes or not, and whether or not the cancer center has spread. Recommendations Recommendations:  Observation  These recommendations are based on information available as of today's consult.  This is subject to change depending further testing or exams. Next Steps Next Step: Medical Oncology will set up follow up appointments:  MRI Brain.  Patient instructed to call back for results.  Order sent for patient to be set up with a rollator walker. Barriers to Care What do you perceive as a potential barrier that may prevent you from receiving your treatment plan? Support Education  Resources Given: Stop Smoking Class information Support/Resources Services at [employer]      Questions Norton Blizzard, RN BSN Thoracic Oncology Nurse Navigator at [facility] is a nurse navigator that is available to assist you through your cancer journey.  She can answer your questions and/or provide resources regarding your treatment plan, emotional support, or financial concerns.

## 2015-10-20 NOTE — Telephone Encounter (Signed)
Called [facility]home and spoke to [place]. Informed that we would not be signing anything for home O2 at this time per pt request.

## 2015-10-21 DIAGNOSIS — E785 Hyperlipidemia, unspecified: Secondary | ICD-10-CM | POA: Diagnosis not present

## 2015-10-21 DIAGNOSIS — Z4682 Encounter for fitting and adjustment of non-vascular catheter: Secondary | ICD-10-CM | POA: Diagnosis not present

## 2015-10-21 DIAGNOSIS — J45909 Unspecified asthma, uncomplicated: Secondary | ICD-10-CM | POA: Diagnosis not present

## 2015-10-21 DIAGNOSIS — Z720 Tobacco use: Secondary | ICD-10-CM | POA: Diagnosis not present

## 2015-10-21 DIAGNOSIS — E119 Type 2 diabetes mellitus without complications: Secondary | ICD-10-CM | POA: Diagnosis not present

## 2015-10-21 DIAGNOSIS — K219 Gastro-esophageal reflux disease without esophagitis: Secondary | ICD-10-CM | POA: Diagnosis not present

## 2015-10-21 DIAGNOSIS — Z483 Aftercare following surgery for neoplasm: Secondary | ICD-10-CM | POA: Diagnosis not present

## 2015-10-21 DIAGNOSIS — C3411 Malignant neoplasm of upper lobe, right bronchus or lung: Secondary | ICD-10-CM | POA: Diagnosis not present

## 2015-10-21 DIAGNOSIS — I739 Peripheral vascular disease, unspecified: Secondary | ICD-10-CM | POA: Diagnosis not present

## 2015-10-22 ENCOUNTER — Encounter: Payer: Self-pay | Admitting: Podiatry

## 2015-10-22 ENCOUNTER — Ambulatory Visit (INDEPENDENT_AMBULATORY_CARE_PROVIDER_SITE_OTHER): Payer: Medicare Other | Admitting: Podiatry

## 2015-10-22 VITALS — BP 130/76 | HR 83

## 2015-10-22 DIAGNOSIS — M79606 Pain in leg, unspecified: Secondary | ICD-10-CM | POA: Insufficient documentation

## 2015-10-22 DIAGNOSIS — B351 Tinea unguium: Secondary | ICD-10-CM

## 2015-10-22 MED ORDER — CICLOPIROX 8 % EX SOLN: Freq: Every day | CUTANEOUS | Status: DC

## 2015-10-22 NOTE — Progress Notes (Signed)
Subjective: 55 year old male presents complaining of painful feet from problematic toe nails. Stated that since the last debridement, his painful calluses did not come back.   Objective: Dermatologic: Thick dystrophic nails x 10. Epicritic and tactile sensations grossly intact. Mild digital contracture lesser digits 4-5 bilateral.  Assessment: Onychomycosis x 10.  Plan: Reviewed findings and available options. Debrided all nails and calluses.

## 2015-10-22 NOTE — Patient Instructions (Signed)
Seen for painful nails. All debrided. Medication prescribed for fungal nails. Return in 3 months.

## 2015-10-26 ENCOUNTER — Ambulatory Visit (HOSPITAL_COMMUNITY)
Admission: RE | Admit: 2015-10-26 | Discharge: 2015-10-26 | Disposition: A | Discharge status: Home / Self Care | Payer: Medicare Other | Source: Ambulatory Visit | Attending: Internal Medicine | Admitting: Internal Medicine

## 2015-10-26 DIAGNOSIS — C3411 Malignant neoplasm of upper lobe, right bronchus or lung: Secondary | ICD-10-CM | POA: Insufficient documentation

## 2015-10-26 DIAGNOSIS — G3189 Other specified degenerative diseases of nervous system: Secondary | ICD-10-CM | POA: Insufficient documentation

## 2015-10-26 DIAGNOSIS — C3490 Malignant neoplasm of unspecified part of unspecified bronchus or lung: Secondary | ICD-10-CM | POA: Diagnosis not present

## 2015-10-26 MED ORDER — GADOBENATE DIMEGLUMINE 529 MG/ML IV SOLN
20.0000 mL | Freq: Once | INTRAVENOUS | Status: AC | PRN
  Administered 2015-10-26: 20 mL via INTRAVENOUS

## 2015-10-28 ENCOUNTER — Encounter: Payer: Self-pay | Admitting: *Deleted

## 2015-10-28 ENCOUNTER — Telehealth: Payer: Self-pay | Admitting: *Deleted

## 2015-10-28 NOTE — Telephone Encounter (Signed)
Oncology Nurse Navigator Documentation  Oncology Nurse Navigator Flowsheets 10/28/2015  Navigator Encounter Type Telephone/I called [name] to follow up from thoracic clinic on 10/15/15.  He states he is doing well.  I asked about his smoking cessation.  He is signed up for a class to start this month.  He stated he would also like to be referred to pulmonary rehab.  I will discuss with Dr. Julien Nordmann and make referral.  [name] was thankful for the call.   Treatment Initiated Date 09/04/2015  Barriers/Navigation Needs Coordination of Care  Interventions Coordination of Care;Referrals  Coordination of Care Pulmonary Rehab  Support Groups/Services Other  Acuity Level 2  Time Spent with Patient 30

## 2015-10-28 NOTE — Progress Notes (Signed)
FAXED [place] AT [phone]

## 2015-10-29 DIAGNOSIS — I739 Peripheral vascular disease, unspecified: Secondary | ICD-10-CM | POA: Diagnosis not present

## 2015-10-29 DIAGNOSIS — J45909 Unspecified asthma, uncomplicated: Secondary | ICD-10-CM | POA: Diagnosis not present

## 2015-10-29 DIAGNOSIS — Z483 Aftercare following surgery for neoplasm: Secondary | ICD-10-CM | POA: Diagnosis not present

## 2015-10-29 DIAGNOSIS — Z720 Tobacco use: Secondary | ICD-10-CM | POA: Diagnosis not present

## 2015-10-29 DIAGNOSIS — C3411 Malignant neoplasm of upper lobe, right bronchus or lung: Secondary | ICD-10-CM | POA: Diagnosis not present

## 2015-10-29 DIAGNOSIS — E119 Type 2 diabetes mellitus without complications: Secondary | ICD-10-CM | POA: Diagnosis not present

## 2015-10-29 DIAGNOSIS — K219 Gastro-esophageal reflux disease without esophagitis: Secondary | ICD-10-CM | POA: Diagnosis not present

## 2015-10-29 DIAGNOSIS — Z4682 Encounter for fitting and adjustment of non-vascular catheter: Secondary | ICD-10-CM | POA: Diagnosis not present

## 2015-10-29 DIAGNOSIS — E785 Hyperlipidemia, unspecified: Secondary | ICD-10-CM | POA: Diagnosis not present

## 2015-11-02 ENCOUNTER — Telehealth: Payer: Self-pay

## 2015-11-02 NOTE — Telephone Encounter (Signed)
PA initiated via CoverMyMeds Key [id number]

## 2015-11-02 NOTE — Telephone Encounter (Signed)
PA approved, pharmacy to advised

## 2015-11-03 ENCOUNTER — Telehealth (HOSPITAL_COMMUNITY): Payer: Self-pay

## 2015-11-03 NOTE — Telephone Encounter (Signed)
I have called and left a message with [facility] to inquire about participation in Pulmonary Rehab per Dr. Worthy Flank referral. Will send letter in mail and follow up.

## 2015-11-05 DIAGNOSIS — I739 Peripheral vascular disease, unspecified: Secondary | ICD-10-CM | POA: Diagnosis not present

## 2015-11-05 DIAGNOSIS — E119 Type 2 diabetes mellitus without complications: Secondary | ICD-10-CM | POA: Diagnosis not present

## 2015-11-05 DIAGNOSIS — K219 Gastro-esophageal reflux disease without esophagitis: Secondary | ICD-10-CM | POA: Diagnosis not present

## 2015-11-05 DIAGNOSIS — Z483 Aftercare following surgery for neoplasm: Secondary | ICD-10-CM | POA: Diagnosis not present

## 2015-11-05 DIAGNOSIS — E785 Hyperlipidemia, unspecified: Secondary | ICD-10-CM | POA: Diagnosis not present

## 2015-11-05 DIAGNOSIS — C3411 Malignant neoplasm of upper lobe, right bronchus or lung: Secondary | ICD-10-CM | POA: Diagnosis not present

## 2015-11-05 DIAGNOSIS — Z720 Tobacco use: Secondary | ICD-10-CM | POA: Diagnosis not present

## 2015-11-05 DIAGNOSIS — Z4682 Encounter for fitting and adjustment of non-vascular catheter: Secondary | ICD-10-CM | POA: Diagnosis not present

## 2015-11-05 DIAGNOSIS — J45909 Unspecified asthma, uncomplicated: Secondary | ICD-10-CM | POA: Diagnosis not present

## 2015-11-10 ENCOUNTER — Other Ambulatory Visit: Payer: Self-pay | Admitting: Internal Medicine

## 2015-11-10 ENCOUNTER — Telehealth: Payer: Self-pay | Admitting: Internal Medicine

## 2015-11-10 DIAGNOSIS — M545 Low back pain, unspecified: Secondary | ICD-10-CM

## 2015-11-10 DIAGNOSIS — M170 Bilateral primary osteoarthritis of knee: Secondary | ICD-10-CM

## 2015-11-10 DIAGNOSIS — G8929 Other chronic pain: Secondary | ICD-10-CM

## 2015-11-10 MED ORDER — HYDROCODONE-ACETAMINOPHEN 10-325 MG PO TABS: 1.0000 | ORAL_TABLET | Freq: Three times a day (TID) | ORAL | Status: DC | PRN

## 2015-11-10 NOTE — Telephone Encounter (Signed)
Is requesting verbal orders to recert patient for skilled nursing.

## 2015-11-10 NOTE — Addendum Note (Signed)
Addended by: [name] on: 11/10/2015 02:49 PM   Modules accepted: Orders

## 2015-11-11 DIAGNOSIS — C3411 Malignant neoplasm of upper lobe, right bronchus or lung: Secondary | ICD-10-CM | POA: Diagnosis not present

## 2015-11-11 DIAGNOSIS — J45909 Unspecified asthma, uncomplicated: Secondary | ICD-10-CM | POA: Diagnosis not present

## 2015-11-11 DIAGNOSIS — Z720 Tobacco use: Secondary | ICD-10-CM | POA: Diagnosis not present

## 2015-11-11 DIAGNOSIS — I739 Peripheral vascular disease, unspecified: Secondary | ICD-10-CM | POA: Diagnosis not present

## 2015-11-11 DIAGNOSIS — K219 Gastro-esophageal reflux disease without esophagitis: Secondary | ICD-10-CM | POA: Diagnosis not present

## 2015-11-11 DIAGNOSIS — Z483 Aftercare following surgery for neoplasm: Secondary | ICD-10-CM | POA: Diagnosis not present

## 2015-11-11 DIAGNOSIS — E119 Type 2 diabetes mellitus without complications: Secondary | ICD-10-CM | POA: Diagnosis not present

## 2015-11-11 DIAGNOSIS — E785 Hyperlipidemia, unspecified: Secondary | ICD-10-CM | POA: Diagnosis not present

## 2015-11-11 DIAGNOSIS — Z4682 Encounter for fitting and adjustment of non-vascular catheter: Secondary | ICD-10-CM | POA: Diagnosis not present

## 2015-11-11 NOTE — Telephone Encounter (Signed)
1st attempt

## 2015-11-12 ENCOUNTER — Telehealth: Payer: Self-pay | Admitting: Internal Medicine

## 2015-11-12 NOTE — Telephone Encounter (Signed)
Pt came by to pick up his prescription and he wanted you to know [place] RN needs you to call with a verbal order for her to continue to see pt to help with his walking, smoking cessation, etc. Per pt, RN has left a msg for you regarding this. Please respond to pt via MyChart.

## 2015-11-13 ENCOUNTER — Ambulatory Visit (HOSPITAL_COMMUNITY): Payer: Self-pay

## 2015-11-13 NOTE — Telephone Encounter (Signed)
Nurse has been called twice. wIll inform pt I am unable to reach her a this time LMOVM

## 2015-11-13 NOTE — Telephone Encounter (Signed)
2nd attempt

## 2015-11-16 ENCOUNTER — Inpatient Hospital Stay (HOSPITAL_COMMUNITY): Admission: RE | Admit: 2015-11-16 | Payer: Self-pay | Source: Ambulatory Visit

## 2015-11-16 ENCOUNTER — Telehealth (HOSPITAL_COMMUNITY): Payer: Self-pay | Admitting: *Deleted

## 2015-11-16 NOTE — Telephone Encounter (Signed)
Verbal given 

## 2015-11-18 ENCOUNTER — Telehealth: Payer: Self-pay | Admitting: Internal Medicine

## 2015-11-18 DIAGNOSIS — J45909 Unspecified asthma, uncomplicated: Secondary | ICD-10-CM | POA: Diagnosis not present

## 2015-11-18 DIAGNOSIS — Z4682 Encounter for fitting and adjustment of non-vascular catheter: Secondary | ICD-10-CM | POA: Diagnosis not present

## 2015-11-18 DIAGNOSIS — I739 Peripheral vascular disease, unspecified: Secondary | ICD-10-CM | POA: Diagnosis not present

## 2015-11-18 DIAGNOSIS — E119 Type 2 diabetes mellitus without complications: Secondary | ICD-10-CM | POA: Diagnosis not present

## 2015-11-18 DIAGNOSIS — E785 Hyperlipidemia, unspecified: Secondary | ICD-10-CM | POA: Diagnosis not present

## 2015-11-18 DIAGNOSIS — C3411 Malignant neoplasm of upper lobe, right bronchus or lung: Secondary | ICD-10-CM | POA: Diagnosis not present

## 2015-11-18 DIAGNOSIS — Z483 Aftercare following surgery for neoplasm: Secondary | ICD-10-CM | POA: Diagnosis not present

## 2015-11-18 DIAGNOSIS — K219 Gastro-esophageal reflux disease without esophagitis: Secondary | ICD-10-CM | POA: Diagnosis not present

## 2015-11-18 DIAGNOSIS — Z720 Tobacco use: Secondary | ICD-10-CM | POA: Diagnosis not present

## 2015-11-18 NOTE — Telephone Encounter (Signed)
Maudie Mercury, nurse from [facility], called to report that [name] had a fall over the weekend, no injury.

## 2015-11-19 ENCOUNTER — Other Ambulatory Visit: Payer: Self-pay | Admitting: Internal Medicine

## 2015-11-19 ENCOUNTER — Encounter: Payer: Self-pay | Admitting: Internal Medicine

## 2015-11-19 DIAGNOSIS — H40003 Preglaucoma, unspecified, bilateral: Secondary | ICD-10-CM

## 2015-11-20 ENCOUNTER — Other Ambulatory Visit: Payer: Self-pay | Admitting: Thoracic Surgery (Cardiothoracic Vascular Surgery)

## 2015-11-20 DIAGNOSIS — C3490 Malignant neoplasm of unspecified part of unspecified bronchus or lung: Secondary | ICD-10-CM

## 2015-11-20 NOTE — Telephone Encounter (Signed)
Routing as fyi

## 2015-11-24 ENCOUNTER — Telehealth (HOSPITAL_COMMUNITY): Payer: Self-pay | Admitting: *Deleted

## 2015-11-24 ENCOUNTER — Encounter: Payer: Self-pay | Admitting: Thoracic Surgery (Cardiothoracic Vascular Surgery)

## 2015-11-25 DIAGNOSIS — Z4682 Encounter for fitting and adjustment of non-vascular catheter: Secondary | ICD-10-CM | POA: Diagnosis not present

## 2015-11-25 DIAGNOSIS — Z720 Tobacco use: Secondary | ICD-10-CM | POA: Diagnosis not present

## 2015-11-25 DIAGNOSIS — E119 Type 2 diabetes mellitus without complications: Secondary | ICD-10-CM | POA: Diagnosis not present

## 2015-11-25 DIAGNOSIS — K219 Gastro-esophageal reflux disease without esophagitis: Secondary | ICD-10-CM | POA: Diagnosis not present

## 2015-11-25 DIAGNOSIS — Z483 Aftercare following surgery for neoplasm: Secondary | ICD-10-CM | POA: Diagnosis not present

## 2015-11-25 DIAGNOSIS — E785 Hyperlipidemia, unspecified: Secondary | ICD-10-CM | POA: Diagnosis not present

## 2015-11-25 DIAGNOSIS — I739 Peripheral vascular disease, unspecified: Secondary | ICD-10-CM | POA: Diagnosis not present

## 2015-11-25 DIAGNOSIS — J45909 Unspecified asthma, uncomplicated: Secondary | ICD-10-CM | POA: Diagnosis not present

## 2015-11-25 DIAGNOSIS — C3411 Malignant neoplasm of upper lobe, right bronchus or lung: Secondary | ICD-10-CM | POA: Diagnosis not present

## 2015-11-30 ENCOUNTER — Inpatient Hospital Stay (HOSPITAL_COMMUNITY)
Admission: RE | Admit: 2015-11-30 | Discharge: 2015-11-30 | Disposition: A | Discharge status: Home / Self Care | Payer: Self-pay | Source: Ambulatory Visit

## 2015-12-01 ENCOUNTER — Emergency Department (HOSPITAL_COMMUNITY)
Admission: EM | Admit: 2015-12-01 | Discharge: 2015-12-01 | Disposition: A | Discharge status: Home / Self Care | Payer: Medicare Other | Attending: Emergency Medicine | Admitting: Emergency Medicine

## 2015-12-01 ENCOUNTER — Encounter (HOSPITAL_COMMUNITY): Payer: Self-pay | Admitting: Emergency Medicine

## 2015-12-01 DIAGNOSIS — F17210 Nicotine dependence, cigarettes, uncomplicated: Secondary | ICD-10-CM | POA: Diagnosis not present

## 2015-12-01 DIAGNOSIS — I509 Heart failure, unspecified: Secondary | ICD-10-CM | POA: Diagnosis not present

## 2015-12-01 DIAGNOSIS — J441 Chronic obstructive pulmonary disease with (acute) exacerbation: Secondary | ICD-10-CM | POA: Diagnosis not present

## 2015-12-01 DIAGNOSIS — R0602 Shortness of breath: Secondary | ICD-10-CM | POA: Diagnosis present

## 2015-12-01 DIAGNOSIS — R069 Unspecified abnormalities of breathing: Secondary | ICD-10-CM | POA: Diagnosis not present

## 2015-12-01 DIAGNOSIS — I10 Essential (primary) hypertension: Secondary | ICD-10-CM | POA: Diagnosis not present

## 2015-12-01 DIAGNOSIS — F10129 Alcohol abuse with intoxication, unspecified: Secondary | ICD-10-CM | POA: Insufficient documentation

## 2015-12-01 DIAGNOSIS — E118 Type 2 diabetes mellitus with unspecified complications: Secondary | ICD-10-CM | POA: Insufficient documentation

## 2015-12-01 LAB — CBC
HEMATOCRIT: 42.1 % (ref 39.0–52.0)
HEMOGLOBIN: 14 g/dL (ref 13.0–17.0)
MCH: 26.7 pg (ref 26.0–34.0)
MCHC: 33.3 g/dL (ref 30.0–36.0)
MCV: 80.3 fL (ref 78.0–100.0)
Platelets: 199 10*3/uL (ref 150–400)
RBC: 5.24 MIL/uL (ref 4.22–5.81)
RDW: 16 % — ABNORMAL HIGH (ref 11.5–15.5)
WBC: 7.1 10*3/uL (ref 4.0–10.5)

## 2015-12-01 LAB — BASIC METABOLIC PANEL
ANION GAP: 14 (ref 5–15)
BUN: <5 mg/dL — ABNORMAL LOW (ref 6–20)
CALCIUM: 8.9 mg/dL (ref 8.9–10.3)
CHLORIDE: 107 mmol/L (ref 101–111)
CO2: 23 mmol/L (ref 22–32)
Creatinine, Ser: 0.73 mg/dL (ref 0.61–1.24)
GFR calc non Af Amer: >60 mL/min (ref >60)
GLUCOSE: 125 mg/dL — AB (ref 65–99)
Potassium: 4.3 mmol/L (ref 3.5–5.1)
Sodium: 144 mmol/L (ref 135–145)

## 2015-12-01 LAB — ETHANOL: ALCOHOL ETHYL (B): 307 mg/dL — AB (ref <5)

## 2015-12-01 MED ORDER — ALBUTEROL SULFATE (2.5 MG/3ML) 0.083% IN NEBU
5.0000 mg | INHALATION_SOLUTION | Freq: Once | RESPIRATORY_TRACT | Status: AC
  Administered 2015-12-01: 5 mg via RESPIRATORY_TRACT

## 2015-12-01 MED ORDER — ALBUTEROL SULFATE (2.5 MG/3ML) 0.083% IN NEBU
INHALATION_SOLUTION | RESPIRATORY_TRACT | Status: AC
  Filled 2015-12-01: qty 6

## 2015-12-01 NOTE — ED Notes (Signed)
Patient states he is going home.  Cab called for patient.  Patient left ED at this time.

## 2015-12-01 NOTE — ED Notes (Signed)
Pt. reports SOB with occasional dry cough onset this evening , denies chest pain , pt. admitted drinking ETOH today .

## 2015-12-02 ENCOUNTER — Encounter (HOSPITAL_COMMUNITY): Payer: Self-pay | Admitting: *Deleted

## 2015-12-02 ENCOUNTER — Emergency Department (HOSPITAL_COMMUNITY): Payer: Medicare Other

## 2015-12-02 ENCOUNTER — Emergency Department (HOSPITAL_COMMUNITY)
Admission: EM | Admit: 2015-12-02 | Discharge: 2015-12-02 | Disposition: A | Discharge status: Home / Self Care | Payer: Medicare Other | Attending: Emergency Medicine | Admitting: Emergency Medicine

## 2015-12-02 DIAGNOSIS — I10 Essential (primary) hypertension: Secondary | ICD-10-CM | POA: Diagnosis not present

## 2015-12-02 DIAGNOSIS — Z8619 Personal history of other infectious and parasitic diseases: Secondary | ICD-10-CM | POA: Insufficient documentation

## 2015-12-02 DIAGNOSIS — Z9861 Coronary angioplasty status: Secondary | ICD-10-CM | POA: Insufficient documentation

## 2015-12-02 DIAGNOSIS — Z800 Family history of malignant neoplasm of digestive organs: Secondary | ICD-10-CM | POA: Diagnosis not present

## 2015-12-02 DIAGNOSIS — F17210 Nicotine dependence, cigarettes, uncomplicated: Secondary | ICD-10-CM | POA: Insufficient documentation

## 2015-12-02 DIAGNOSIS — Z862 Personal history of diseases of the blood and blood-forming organs and certain disorders involving the immune mechanism: Secondary | ICD-10-CM | POA: Diagnosis not present

## 2015-12-02 DIAGNOSIS — Z483 Aftercare following surgery for neoplasm: Secondary | ICD-10-CM | POA: Diagnosis not present

## 2015-12-02 DIAGNOSIS — Z7951 Long term (current) use of inhaled steroids: Secondary | ICD-10-CM | POA: Diagnosis not present

## 2015-12-02 DIAGNOSIS — J45909 Unspecified asthma, uncomplicated: Secondary | ICD-10-CM | POA: Diagnosis not present

## 2015-12-02 DIAGNOSIS — C3411 Malignant neoplasm of upper lobe, right bronchus or lung: Secondary | ICD-10-CM | POA: Diagnosis not present

## 2015-12-02 DIAGNOSIS — R0789 Other chest pain: Secondary | ICD-10-CM | POA: Insufficient documentation

## 2015-12-02 DIAGNOSIS — Z85118 Personal history of other malignant neoplasm of bronchus and lung: Secondary | ICD-10-CM | POA: Insufficient documentation

## 2015-12-02 DIAGNOSIS — Z8701 Personal history of pneumonia (recurrent): Secondary | ICD-10-CM | POA: Diagnosis not present

## 2015-12-02 DIAGNOSIS — F209 Schizophrenia, unspecified: Secondary | ICD-10-CM | POA: Insufficient documentation

## 2015-12-02 DIAGNOSIS — K219 Gastro-esophageal reflux disease without esophagitis: Secondary | ICD-10-CM | POA: Diagnosis not present

## 2015-12-02 DIAGNOSIS — I739 Peripheral vascular disease, unspecified: Secondary | ICD-10-CM | POA: Diagnosis not present

## 2015-12-02 DIAGNOSIS — E785 Hyperlipidemia, unspecified: Secondary | ICD-10-CM | POA: Diagnosis not present

## 2015-12-02 DIAGNOSIS — J449 Chronic obstructive pulmonary disease, unspecified: Secondary | ICD-10-CM | POA: Diagnosis not present

## 2015-12-02 DIAGNOSIS — R531 Weakness: Secondary | ICD-10-CM | POA: Diagnosis not present

## 2015-12-02 DIAGNOSIS — R404 Transient alteration of awareness: Secondary | ICD-10-CM | POA: Diagnosis not present

## 2015-12-02 DIAGNOSIS — Z8711 Personal history of peptic ulcer disease: Secondary | ICD-10-CM | POA: Insufficient documentation

## 2015-12-02 DIAGNOSIS — Z79899 Other long term (current) drug therapy: Secondary | ICD-10-CM | POA: Insufficient documentation

## 2015-12-02 DIAGNOSIS — R079 Chest pain, unspecified: Secondary | ICD-10-CM | POA: Diagnosis not present

## 2015-12-02 DIAGNOSIS — R0602 Shortness of breath: Secondary | ICD-10-CM | POA: Diagnosis not present

## 2015-12-02 DIAGNOSIS — I509 Heart failure, unspecified: Secondary | ICD-10-CM | POA: Diagnosis not present

## 2015-12-02 DIAGNOSIS — Z87448 Personal history of other diseases of urinary system: Secondary | ICD-10-CM | POA: Diagnosis not present

## 2015-12-02 DIAGNOSIS — E119 Type 2 diabetes mellitus without complications: Secondary | ICD-10-CM | POA: Insufficient documentation

## 2015-12-02 DIAGNOSIS — Z4682 Encounter for fitting and adjustment of non-vascular catheter: Secondary | ICD-10-CM | POA: Diagnosis not present

## 2015-12-02 DIAGNOSIS — Z86010 Personal history of colonic polyps: Secondary | ICD-10-CM | POA: Diagnosis not present

## 2015-12-02 DIAGNOSIS — Z720 Tobacco use: Secondary | ICD-10-CM | POA: Diagnosis not present

## 2015-12-02 LAB — CBC
HEMATOCRIT: 42.5 % (ref 39.0–52.0)
Hemoglobin: 14.1 g/dL (ref 13.0–17.0)
MCH: 26.7 pg (ref 26.0–34.0)
MCHC: 33.2 g/dL (ref 30.0–36.0)
MCV: 80.5 fL (ref 78.0–100.0)
PLATELETS: 195 10*3/uL (ref 150–400)
RBC: 5.28 MIL/uL (ref 4.22–5.81)
RDW: 16.3 % — AB (ref 11.5–15.5)
WBC: 8 10*3/uL (ref 4.0–10.5)

## 2015-12-02 LAB — BASIC METABOLIC PANEL
Anion gap: 17 — ABNORMAL HIGH (ref 5–15)
CALCIUM: 9 mg/dL (ref 8.9–10.3)
CO2: 20 mmol/L — ABNORMAL LOW (ref 22–32)
Chloride: 105 mmol/L (ref 101–111)
Creatinine, Ser: 0.62 mg/dL (ref 0.61–1.24)
GFR calc Af Amer: >60 mL/min (ref >60)
Glucose, Bld: 104 mg/dL — ABNORMAL HIGH (ref 65–99)
POTASSIUM: 4.4 mmol/L (ref 3.5–5.1)
SODIUM: 142 mmol/L (ref 135–145)

## 2015-12-02 LAB — I-STAT TROPONIN, ED: TROPONIN I, POC: 0 ng/mL (ref 0.00–0.08)

## 2015-12-02 LAB — BRAIN NATRIURETIC PEPTIDE: B NATRIURETIC PEPTIDE 5: 14.3 pg/mL (ref 0.0–100.0)

## 2015-12-02 LAB — ETHANOL: Alcohol, Ethyl (B): 239 mg/dL — ABNORMAL HIGH (ref <5)

## 2015-12-02 MED ORDER — IOHEXOL 350 MG/ML SOLN
80.0000 mL | Freq: Once | INTRAVENOUS | Status: AC | PRN
  Administered 2015-12-02: 100 mL via INTRAVENOUS

## 2015-12-02 NOTE — ED Notes (Signed)
Pt arrives from home via [facility] c/o central chest pain that began yesterday 5:30pm. Pt also c/o SOB.
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
## 2015-12-02 NOTE — Discharge Instructions (Signed)
Return here as needed.  Follow-up with your primary care doctor °

## 2015-12-04 ENCOUNTER — Telehealth: Payer: Self-pay

## 2015-12-04 DIAGNOSIS — Z483 Aftercare following surgery for neoplasm: Secondary | ICD-10-CM | POA: Diagnosis not present

## 2015-12-04 DIAGNOSIS — R0602 Shortness of breath: Secondary | ICD-10-CM | POA: Diagnosis not present

## 2015-12-04 NOTE — Telephone Encounter (Signed)
Home Health Cert/Plan of Care received (11/11/2015 - 01/10/2016) and placed on MD's desk for signature

## 2015-12-05 IMAGING — DX DG CHEST 2V
2 series · 2 of 2 positions shown · non-contrast
Comparison: Chest radiograph from 10/04/2012

CLINICAL DATA: Acute onset of left-sided chest pressure. Initial
encounter.

EXAM:
CHEST  2 VIEW

[chest pa]
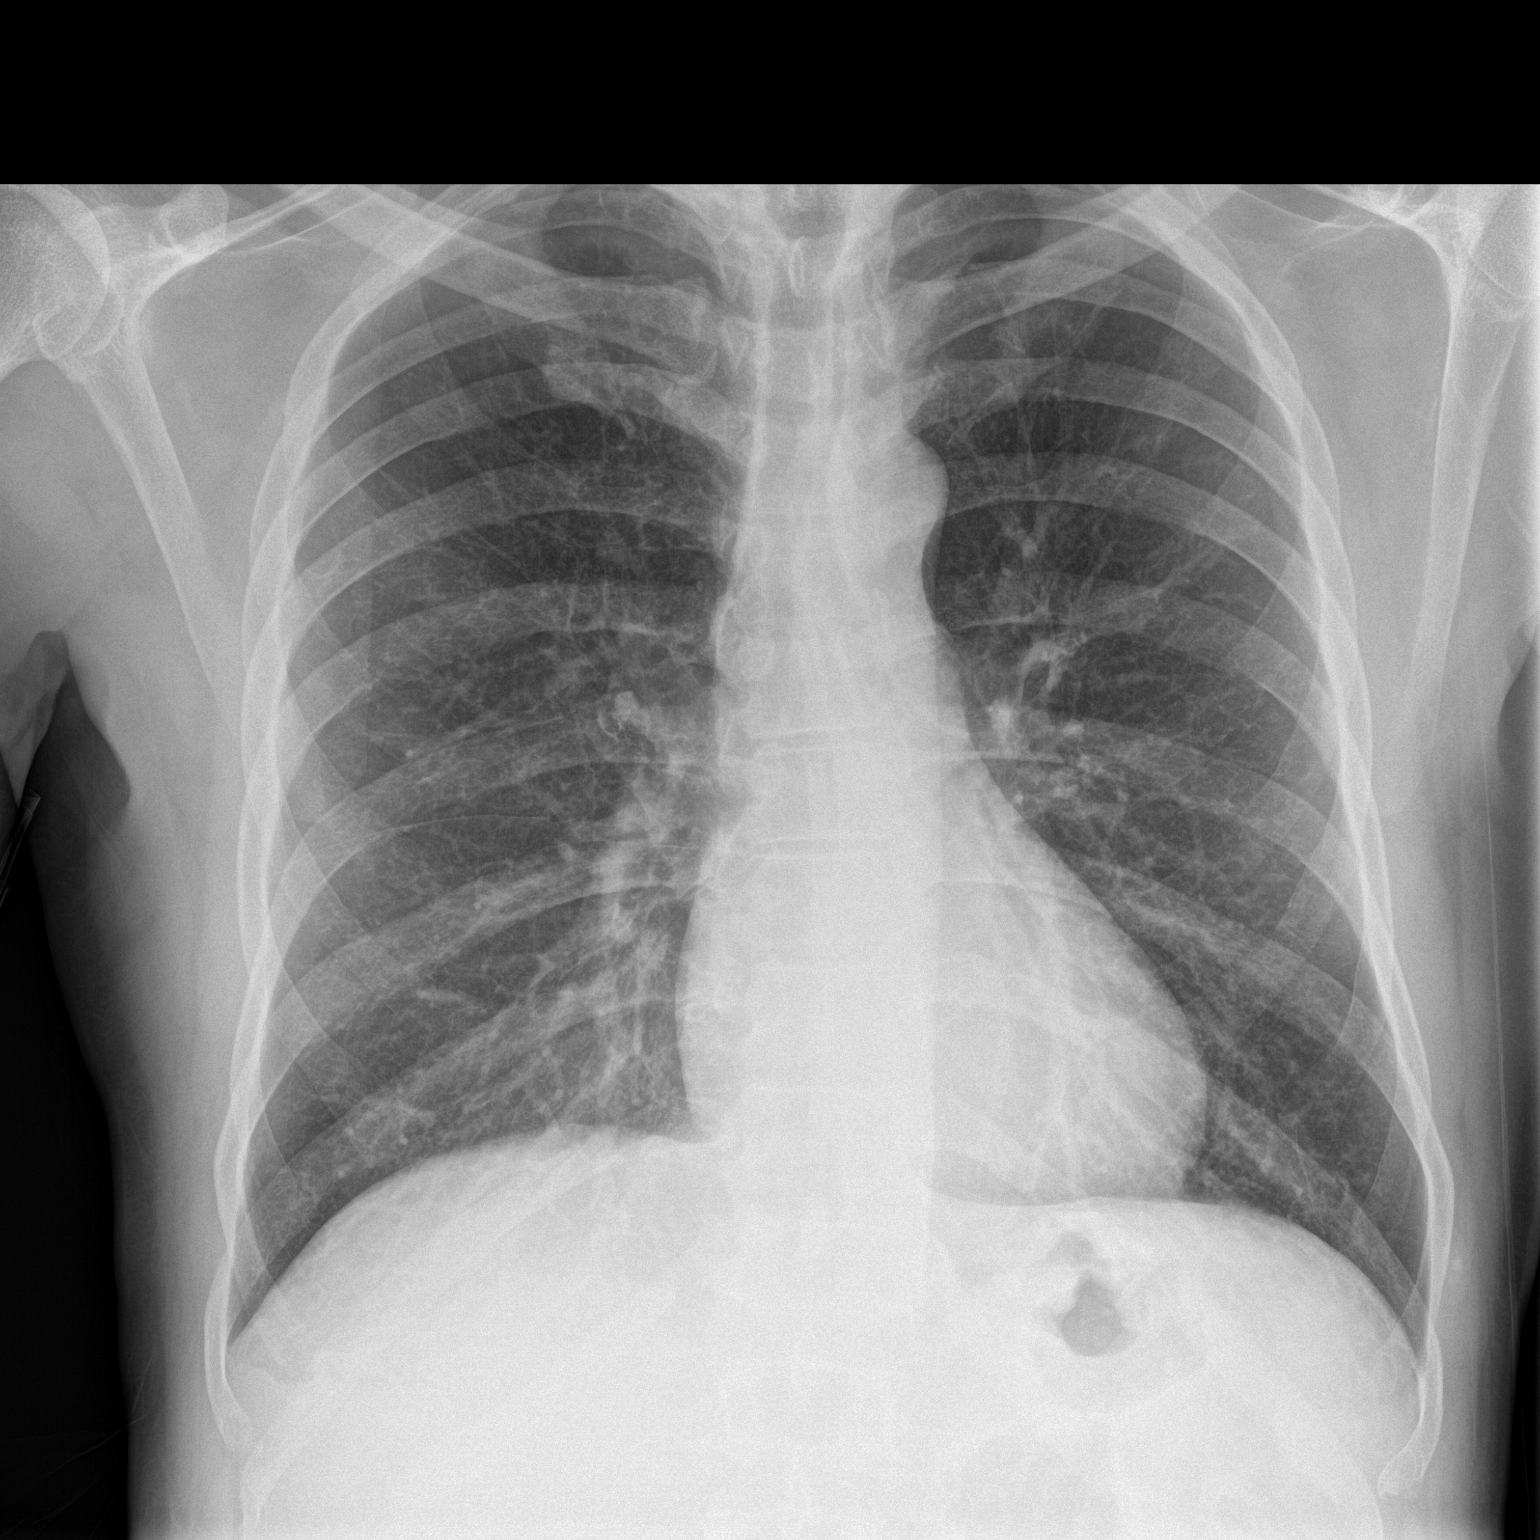

[chest lat]
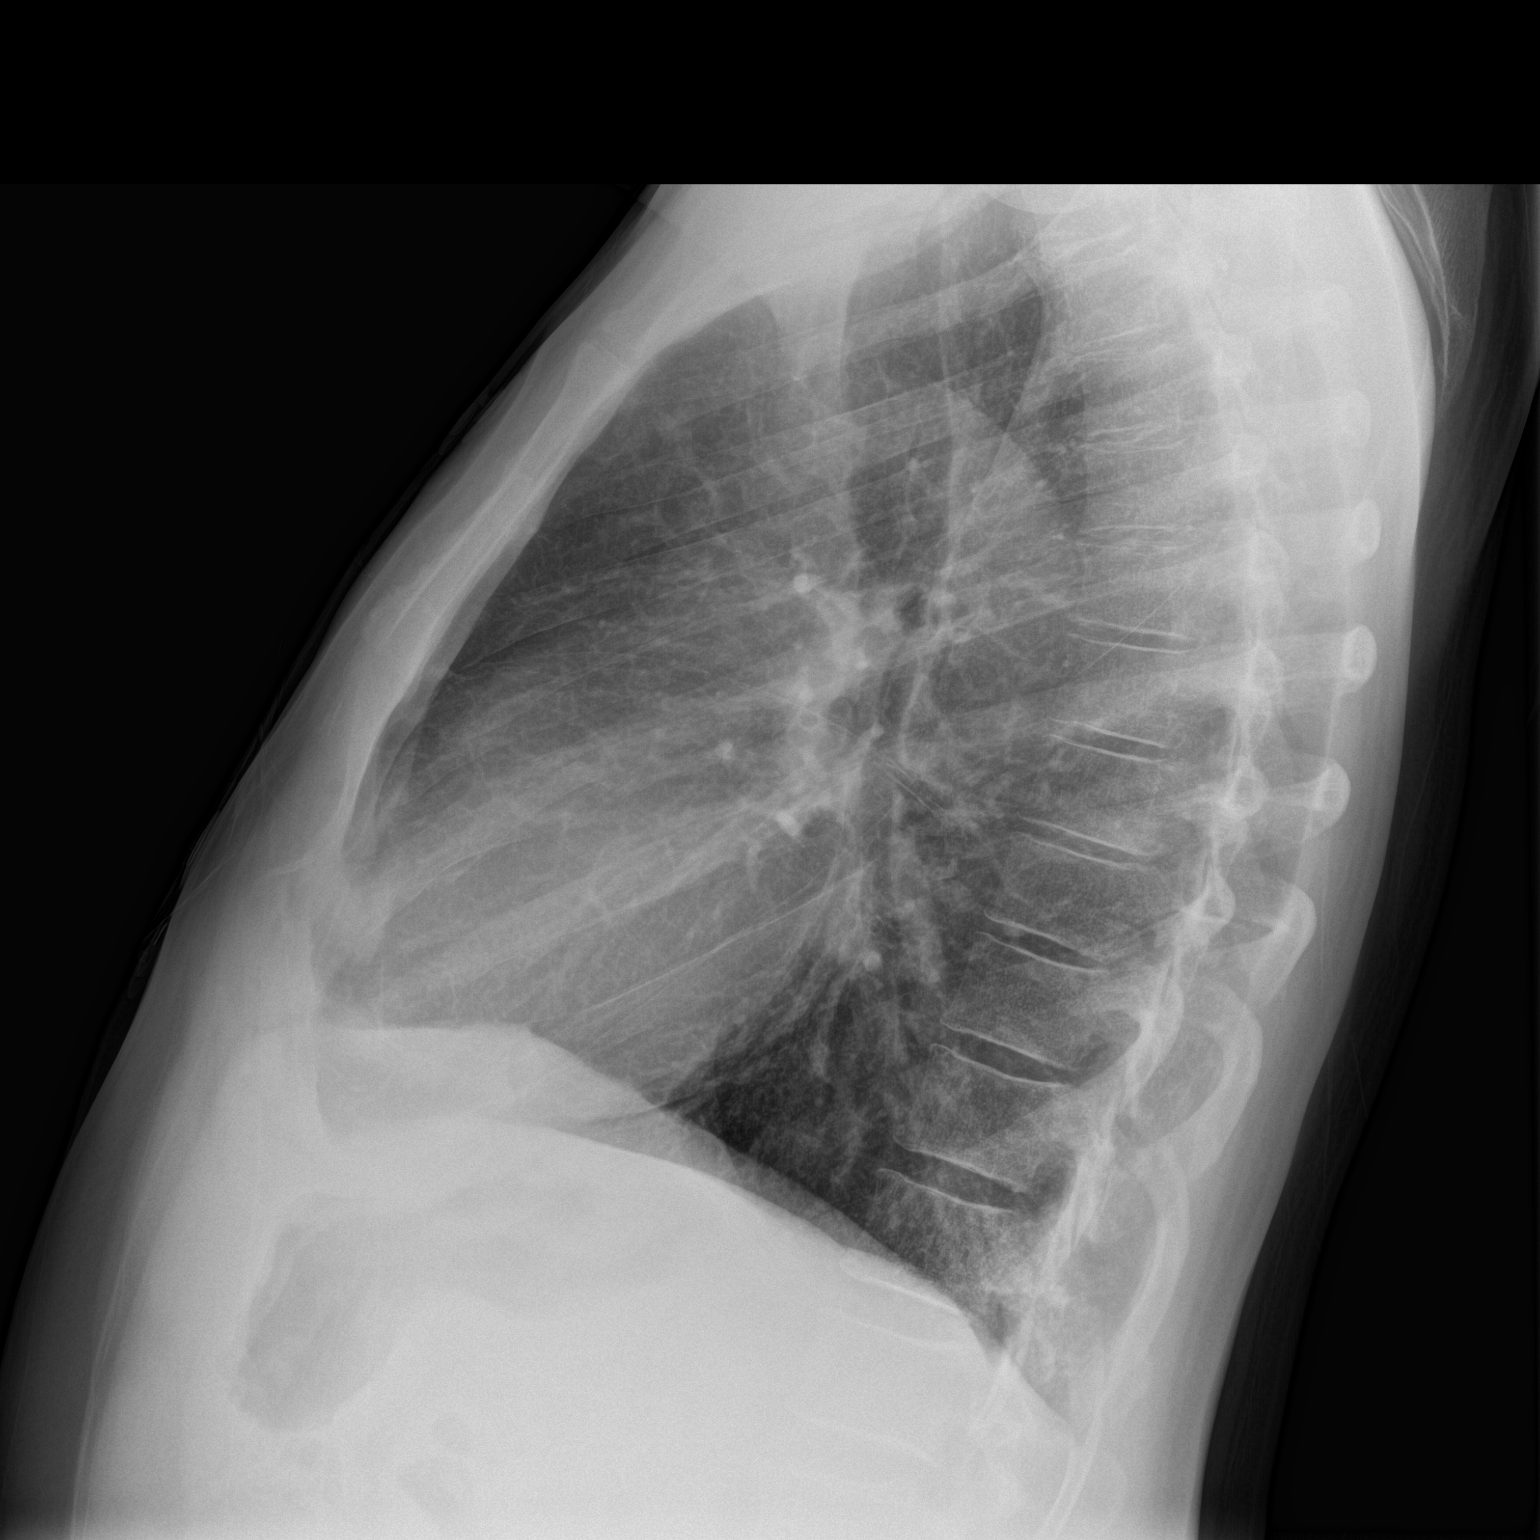

[2 of 2 positions shown; findings below may reference images not displayed]

FINDINGS: The lungs are well-aerated. Chronic peribronchial thickening is
noted. Mild bibasilar atelectasis is seen. There is no evidence of
pleural effusion or pneumothorax.

The heart is normal in size; the mediastinal contour is within
normal limits. No acute osseous abnormalities are seen.
IMPRESSION: Chronic peribronchial thickening noted. Mild bibasilar atelectasis
seen.

## 2015-12-07 NOTE — Telephone Encounter (Signed)
Paperwork signed, faxed, copy sent to scan 

## 2015-12-08 ENCOUNTER — Ambulatory Visit: Payer: Medicare Other

## 2015-12-09 ENCOUNTER — Encounter: Payer: Self-pay | Admitting: Internal Medicine

## 2015-12-09 ENCOUNTER — Telehealth: Payer: Self-pay | Admitting: Internal Medicine

## 2015-12-09 ENCOUNTER — Other Ambulatory Visit: Payer: Self-pay | Admitting: Internal Medicine

## 2015-12-09 DIAGNOSIS — K219 Gastro-esophageal reflux disease without esophagitis: Secondary | ICD-10-CM | POA: Diagnosis not present

## 2015-12-09 DIAGNOSIS — E119 Type 2 diabetes mellitus without complications: Secondary | ICD-10-CM | POA: Diagnosis not present

## 2015-12-09 DIAGNOSIS — J45909 Unspecified asthma, uncomplicated: Secondary | ICD-10-CM | POA: Diagnosis not present

## 2015-12-09 DIAGNOSIS — Z720 Tobacco use: Secondary | ICD-10-CM | POA: Diagnosis not present

## 2015-12-09 DIAGNOSIS — C3411 Malignant neoplasm of upper lobe, right bronchus or lung: Secondary | ICD-10-CM | POA: Diagnosis not present

## 2015-12-09 DIAGNOSIS — E785 Hyperlipidemia, unspecified: Secondary | ICD-10-CM | POA: Diagnosis not present

## 2015-12-09 DIAGNOSIS — I739 Peripheral vascular disease, unspecified: Secondary | ICD-10-CM | POA: Diagnosis not present

## 2015-12-09 DIAGNOSIS — Z483 Aftercare following surgery for neoplasm: Secondary | ICD-10-CM | POA: Diagnosis not present

## 2015-12-09 DIAGNOSIS — Z4682 Encounter for fitting and adjustment of non-vascular catheter: Secondary | ICD-10-CM | POA: Diagnosis not present

## 2015-12-15 ENCOUNTER — Ambulatory Visit: Payer: Self-pay | Admitting: Internal Medicine

## 2015-12-15 ENCOUNTER — Encounter: Payer: Self-pay | Admitting: Internal Medicine

## 2015-12-16 ENCOUNTER — Ambulatory Visit: Payer: Self-pay | Admitting: Internal Medicine

## 2015-12-16 DIAGNOSIS — Z4682 Encounter for fitting and adjustment of non-vascular catheter: Secondary | ICD-10-CM | POA: Diagnosis not present

## 2015-12-16 DIAGNOSIS — C3411 Malignant neoplasm of upper lobe, right bronchus or lung: Secondary | ICD-10-CM | POA: Diagnosis not present

## 2015-12-16 DIAGNOSIS — Z483 Aftercare following surgery for neoplasm: Secondary | ICD-10-CM | POA: Diagnosis not present

## 2015-12-16 DIAGNOSIS — Z720 Tobacco use: Secondary | ICD-10-CM | POA: Diagnosis not present

## 2015-12-16 DIAGNOSIS — E785 Hyperlipidemia, unspecified: Secondary | ICD-10-CM | POA: Diagnosis not present

## 2015-12-16 DIAGNOSIS — J45909 Unspecified asthma, uncomplicated: Secondary | ICD-10-CM | POA: Diagnosis not present

## 2015-12-16 DIAGNOSIS — E119 Type 2 diabetes mellitus without complications: Secondary | ICD-10-CM | POA: Diagnosis not present

## 2015-12-16 DIAGNOSIS — I739 Peripheral vascular disease, unspecified: Secondary | ICD-10-CM | POA: Diagnosis not present

## 2015-12-16 DIAGNOSIS — K219 Gastro-esophageal reflux disease without esophagitis: Secondary | ICD-10-CM | POA: Diagnosis not present

## 2015-12-17 ENCOUNTER — Telehealth (HOSPITAL_COMMUNITY): Payer: Self-pay | Admitting: *Deleted

## 2015-12-21 ENCOUNTER — Other Ambulatory Visit: Payer: Self-pay | Admitting: Internal Medicine

## 2015-12-22 ENCOUNTER — Encounter: Payer: Self-pay | Admitting: Internal Medicine

## 2015-12-22 ENCOUNTER — Ambulatory Visit (INDEPENDENT_AMBULATORY_CARE_PROVIDER_SITE_OTHER): Payer: Medicare Other | Admitting: Internal Medicine

## 2015-12-22 VITALS — BP 118/80 | HR 78 | Temp 98.6°F | Resp 16 | Ht 75.0 in | Wt 199.0 lb

## 2015-12-22 DIAGNOSIS — M545 Low back pain: Secondary | ICD-10-CM | POA: Diagnosis not present

## 2015-12-22 DIAGNOSIS — F418 Other specified anxiety disorders: Secondary | ICD-10-CM

## 2015-12-22 DIAGNOSIS — M170 Bilateral primary osteoarthritis of knee: Secondary | ICD-10-CM

## 2015-12-22 DIAGNOSIS — I10 Essential (primary) hypertension: Secondary | ICD-10-CM | POA: Diagnosis not present

## 2015-12-22 DIAGNOSIS — G8929 Other chronic pain: Secondary | ICD-10-CM

## 2015-12-22 MED ORDER — VILAZODONE HCL 40 MG PO TABS: 40.0000 mg | ORAL_TABLET | Freq: Every day | ORAL | Status: DC

## 2015-12-22 MED ORDER — HYDROCODONE-ACETAMINOPHEN 10-325 MG PO TABS: 1.0000 | ORAL_TABLET | Freq: Three times a day (TID) | ORAL | Status: DC | PRN

## 2015-12-22 MED ORDER — VILAZODONE HCL 10 & 20 & 40 MG PO KIT: 1.0000 | PACK | Freq: Every day | ORAL | Status: DC

## 2015-12-22 NOTE — Progress Notes (Signed)
Subjective:  Patient ID: [name], male    DOB: 12/10/1960  Age: 55 y.o. MRN: [id number]  CC: COPD; Osteoarthritis; and Back Pain   HPI [employer] presents for follow-up. He was recently seen in the ER for atypical chest pain and was found to have a blood alcohol level of 307. Today he tells me that he is being seen at [facility] for treatment of alcoholism and has limited his alcohol intake to a few beverages a week and has not had any alcohol in about 2 or 3 days. He complains today of signs and symptoms of depression. He feels isolated, sad, helpless and sometimes hopeless. He denies any suicidal or homicidal ideations. He has had some weight gain but denies sleep disturbance or loss of appetite. He wants to try something other than Paxil. He has been on that for many years and does not feel like it is helping him much with his current predicament months.   He does complain of chronic joint and back pain and needs a refill today on Norco for pain management. He has COPD and was recently diagnosed with lung cancer. The evaluation and treatment of that is in progress. He has maintained a good appetite and has gained weight.  Outpatient Prescriptions Prior to Visit  Medication Sig Dispense Refill  . carvedilol (COREG) 12.5 MG tablet take 1 tablet by mouth twice a day with meals 180 tablet 3  . ciclopirox (PENLAC) 8 % solution Apply topically at bedtime. Apply over nail and surrounding skin. Apply daily over previous coat. After seven (7) days, may remove with alcohol and continue cycle. 6.6 mL 2  . esomeprazole (NEXIUM) 40 MG capsule AS DIRECTED. (Patient taking differently: TAKE 1 TABLET IN THE MORNING) 30 capsule 11  . LIVALO 2 MG TABS TAKE 1 TABLET BY MOUTH EVERY DAY. 30 tablet 11  . mometasone-formoterol (DULERA) 100-5 MCG/ACT AERO Take 2 puffs first thing in am and then another 2 puffs about 12 hours later. 1 Inhaler 11  . PROAIR HFA 108 (90 Base) MCG/ACT inhaler INHALE 1-2 PUFFS BY  MOUTH EVERY SIX HOURS FOR wheezing AND SHORTNESS OF BREATH 8.5 g 11  . QUEtiapine (SEROQUEL) 100 MG tablet Take 1 tablet (100 mg total) by mouth at bedtime. 90 tablet 3  . HYDROcodone-acetaminophen (NORCO) 10-325 MG tablet Take 1 tablet by mouth every 8 (eight) hours as needed. (Patient taking differently: Take 1 tablet by mouth every 8 (eight) hours as needed for moderate pain. ) 90 tablet 0  . PARoxetine (PAXIL) 30 MG tablet TAKE 1 TABLET BY MOUTH EVERY MORNING. 90 tablet 3  . traZODone (DESYREL) 100 MG tablet TAKE THREE TABLETS BY MOUTH AT BEDTIME 270 tablet 3  . TRUVADA 200-300 MG tablet TAKE 1 TABLET BY MOUTH DAILY. 90 tablet 1  . guaiFENesin (MUCINEX) 600 MG 12 hr tablet Take 1 tablet (600 mg total) by mouth 2 (two) times daily as needed. (Patient not taking: Reported on 12/22/2015)     No facility-administered medications prior to visit.    ROS Review of Systems  Constitutional: Positive for unexpected weight change. Negative for fever, chills, diaphoresis, activity change, appetite change and fatigue.  HENT: Negative.   Eyes: Negative.   Respiratory: Positive for shortness of breath. Negative for cough, choking, chest tightness and stridor.   Cardiovascular: Negative.  Negative for chest pain, palpitations and leg swelling.  Gastrointestinal: Negative.  Negative for nausea, abdominal pain, diarrhea, constipation and blood in stool.  Endocrine: Negative.  Genitourinary: Negative.   Musculoskeletal: Positive for back pain and arthralgias. Negative for myalgias, joint swelling, gait problem and neck pain.  Skin: Negative.  Negative for color change and rash.  Allergic/Immunologic: Negative.   Neurological: Negative.  Negative for dizziness, tremors, syncope, speech difficulty, light-headedness, numbness and headaches.  Hematological: Negative.  Negative for adenopathy. Does not bruise/bleed easily.  Psychiatric/Behavioral: Negative.     Objective:  BP 118/80 mmHg  Pulse 78   Temp(Src) 98.6 F (37 C) (Oral)  Resp 16  Ht '6\' 3"'  (1.905 m)  Wt 199 lb (90.266 kg)  BMI 24.87 kg/m2  SpO2 98%  BP Readings from Last 3 Encounters:  12/22/15 118/80  12/02/15 168/90  12/01/15 150/94    Wt Readings from Last 3 Encounters:  12/22/15 199 lb (90.266 kg)  12/02/15 190 lb (86.183 kg)  12/01/15 184 lb 7 oz (83.66 kg)    Physical Exam  Constitutional: He is oriented to person, place, and time. He appears well-developed and well-nourished. No distress.  HENT:  Head: Normocephalic and atraumatic.  Mouth/Throat: Oropharynx is clear and moist. No oropharyngeal exudate.  Eyes: Conjunctivae are normal. Right eye exhibits no discharge. Left eye exhibits no discharge. No scleral icterus.  Neck: Normal range of motion. Neck supple. No JVD present. No tracheal deviation present. No thyromegaly present.  Cardiovascular: Normal rate, regular rhythm, normal heart sounds and intact distal pulses.  Exam reveals no gallop and no friction rub.   No murmur heard. Pulmonary/Chest: Effort normal and breath sounds normal. No stridor. No respiratory distress. He has no wheezes. He has no rales. He exhibits no tenderness.  Abdominal: Soft. Bowel sounds are normal. He exhibits no distension and no mass. There is no tenderness. There is no rebound and no guarding.  Musculoskeletal: Normal range of motion. He exhibits no edema or tenderness.  Lymphadenopathy:    He has no cervical adenopathy.  Neurological: He is oriented to person, place, and time.  Skin: Skin is warm and dry. No rash noted. He is not diaphoretic. No erythema. No pallor.  Psychiatric: His speech is normal and behavior is normal. Judgment normal. His mood appears anxious. His affect is not angry, not blunt, not labile and not inappropriate. He is not agitated, not slowed and not withdrawn. Cognition and memory are normal. Cognition and memory are not impaired. He exhibits a depressed mood. He expresses no homicidal and no suicidal  ideation. He expresses no suicidal plans and no homicidal plans. He exhibits normal recent memory and normal remote memory. He is attentive.  Vitals reviewed.   Lab Results  Component Value Date   WBC 8.0 12/02/2015   HGB 14.1 12/02/2015   HCT 42.5 12/02/2015   PLT 195 12/02/2015   GLUCOSE 104* 12/02/2015   CHOL 134 08/11/2015   TRIG 78.0 08/11/2015   HDL 38.90* 08/11/2015   LDLDIRECT 71.2 09/29/2011   LDLCALC 79 08/11/2015   ALT 15 10/15/2015   AST 15 10/15/2015   NA 142 12/02/2015   K 4.4 12/02/2015   CL 105 12/02/2015   CREATININE 0.62 12/02/2015   BUN <5* 12/02/2015   CO2 20* 12/02/2015   TSH 1.14 08/11/2015   PSA 0.69 12/05/2013   INR 1.03 08/26/2015   HGBA1C 5.6 08/11/2015   MICROALBUR 0.2 10/08/2008    Dg Chest 2 View  12/02/2015  CLINICAL DATA:  Shortness of breath. Chest pain. History of lung cancer. Subsequent encounter. EXAM: CHEST  2 VIEW COMPARISON:  CT chest 10/01/2015. PA and lateral chest 09/30/2015  and 09/17/2015. FINDINGS: There is volume loss in the right chest with tenting of the right hemidiaphragm. Right pneumothorax seen on the prior study has resolved. Small amount of pleural fluid and/or scarring is seen in the right apex. No consolidative process or pneumothorax is identified. Heart size is normal. No focal bony abnormality. IMPRESSION: No acute disease. Postoperative change right chest as described above. Electronically Signed   By: Inge Rise M.D.   On: 12/02/2015 14:11   Ct Angio Chest Pe W/cm &/or Wo Cm  12/02/2015  CLINICAL DATA:  Right-sided chest pain and shortness of breath, history of prior right lung resection for carcinoma EXAM: CT ANGIOGRAPHY CHEST WITH CONTRAST TECHNIQUE: Multidetector CT imaging of the chest was performed using the standard protocol during bolus administration of intravenous contrast. Multiplanar CT image reconstructions and MIPs were obtained to evaluate the vascular anatomy. CONTRAST:  128m OMNIPAQUE IOHEXOL 350 MG/ML  SOLN COMPARISON:  10/01/2015 FINDINGS: The left lung is well aerated and demonstrates emphysematous changes. No focal infiltrate or sizable effusion is seen. On the right, emphysematous changes are noted. Postsurgical changes are noted consistent with prior right upper lobe lobectomy. The hydropneumothorax has resolved with the space now filled with fluid along the apex the scarring seen in the right lower lobe has improved from the prior exam. The thoracic aorta and pulmonary artery are well visualized and within normal limits. No evidence of pulmonary emboli are seen. Scattered small mediastinal nodes are noted. They are not significantly change from the prior exam. The visualized upper abdomen is within normal limits. No acute bony abnormality is seen. Review of the MIP images confirms the above findings. IMPRESSION: Postoperative changes consistent with the clinical history. Resolving scarring in the right lower lobe as well as resolution of the previously seen right hydropneumothorax. The previous pneumothorax space is now occupied by a small amount of fluid. No evidence of pulmonary embolism. No other focal abnormality is noted. Electronically Signed   By: MInez CatalinaM.D.   On: 12/02/2015 14:47    Assessment & Plan:   RMicaiahwas seen today for copd, osteoarthritis and back pain.  Diagnoses and all orders for this visit:  HYPERTENSION, BENIGN- his blood pressures well controlled, electrolytes and renal function are stable.  Depression with anxiety- for better symptom resolution I have asked him to change from paroxetine to Viibryd -     Vilazodone HCl (VIIBRYD) 40 MG TABS; Take 1 tablet (40 mg total) by mouth daily. -     Vilazodone HCl (VIIBRYD) 10 & 20 & 40 MG KIT; Take 1 tablet by mouth daily.  Chronic midline low back pain without sciatica -     Discontinue: HYDROcodone-acetaminophen (NORCO) 10-325 MG tablet; Take 1 tablet by mouth every 8 (eight) hours as needed. -     Discontinue:  HYDROcodone-acetaminophen (NORCO) 10-325 MG tablet; Take 1 tablet by mouth every 8 (eight) hours as needed. -     Discontinue: HYDROcodone-acetaminophen (NORCO) 10-325 MG tablet; Take 1 tablet by mouth every 8 (eight) hours as needed. -     HYDROcodone-acetaminophen (NORCO) 10-325 MG tablet; Take 1 tablet by mouth every 8 (eight) hours as needed.  Chronic low back pain without sciatica, unspecified back pain laterality -     Discontinue: HYDROcodone-acetaminophen (NORCO) 10-325 MG tablet; Take 1 tablet by mouth every 8 (eight) hours as needed. -     Discontinue: HYDROcodone-acetaminophen (NORCO) 10-325 MG tablet; Take 1 tablet by mouth every 8 (eight) hours as needed. -  Discontinue: HYDROcodone-acetaminophen (NORCO) 10-325 MG tablet; Take 1 tablet by mouth every 8 (eight) hours as needed. -     HYDROcodone-acetaminophen (NORCO) 10-325 MG tablet; Take 1 tablet by mouth every 8 (eight) hours as needed.  Primary osteoarthritis of both knees -     Discontinue: HYDROcodone-acetaminophen (NORCO) 10-325 MG tablet; Take 1 tablet by mouth every 8 (eight) hours as needed. -     Discontinue: HYDROcodone-acetaminophen (NORCO) 10-325 MG tablet; Take 1 tablet by mouth every 8 (eight) hours as needed. -     Discontinue: HYDROcodone-acetaminophen (NORCO) 10-325 MG tablet; Take 1 tablet by mouth every 8 (eight) hours as needed. -     HYDROcodone-acetaminophen (NORCO) 10-325 MG tablet; Take 1 tablet by mouth every 8 (eight) hours as needed.  I have discontinued Mr. Salminen PARoxetine, guaiFENesin, and traZODone. I am also having him start on Vilazodone HCl and Vilazodone HCl. Additionally, I am having him maintain his carvedilol, esomeprazole, mometasone-formoterol, QUEtiapine, LIVALO, PROAIR HFA, ciclopirox, and HYDROcodone-acetaminophen.  Meds ordered this encounter  Medications  . DISCONTD: HYDROcodone-acetaminophen (NORCO) 10-325 MG tablet    Sig: Take 1 tablet by mouth every 8 (eight) hours as needed.      Dispense:  90 tablet    Refill:  0    Fill on or after 12/22/15  . DISCONTD: HYDROcodone-acetaminophen (NORCO) 10-325 MG tablet    Sig: Take 1 tablet by mouth every 8 (eight) hours as needed.    Dispense:  90 tablet    Refill:  0    Fill on or after 01/22/16  . DISCONTD: HYDROcodone-acetaminophen (NORCO) 10-325 MG tablet    Sig: Take 1 tablet by mouth every 8 (eight) hours as needed.    Dispense:  90 tablet    Refill:  0    Fill on or after 02/21/16  . HYDROcodone-acetaminophen (NORCO) 10-325 MG tablet    Sig: Take 1 tablet by mouth every 8 (eight) hours as needed.    Dispense:  90 tablet    Refill:  0    Fill on or after 03/23/16  . Vilazodone HCl (VIIBRYD) 40 MG TABS    Sig: Take 1 tablet (40 mg total) by mouth daily.    Dispense:  30 tablet    Refill:  11  . Vilazodone HCl (VIIBRYD) 10 & 20 & 40 MG KIT    Sig: Take 1 tablet by mouth daily.    Dispense:  1 kit    Refill:  0     Follow-up: Return in about 4 months (around 04/22/2016).  Scarlette Calico, MD

## 2015-12-22 NOTE — Patient Instructions (Signed)
Major Depressive Disorder Major depressive disorder is a mental illness. It also may be called clinical depression or unipolar depression. Major depressive disorder usually causes feelings of sadness, hopelessness, or helplessness. Some people with this disorder do not feel particularly sad but lose interest in doing things they used to enjoy (anhedonia). Major depressive disorder also can cause physical symptoms. It can interfere with work, school, relationships, and other normal everyday activities. The disorder varies in severity but is longer lasting and more serious than the sadness we all feel from time to time in our lives. Major depressive disorder often is triggered by stressful life events or major life changes. Examples of these triggers include divorce, loss of your job or home, a move, and the death of a family member or close friend. Sometimes this disorder occurs for no obvious reason at all. People who have family members with major depressive disorder or bipolar disorder are at higher risk for developing this disorder, with or without life stressors. Major depressive disorder can occur at any age. It may occur just once in your life (single episode major depressive disorder). It may occur multiple times (recurrent major depressive disorder). SYMPTOMS People with major depressive disorder have either anhedonia or depressed mood on nearly a daily basis for at least 2 weeks or longer. Symptoms of depressed mood include:  Feelings of sadness (blue or down in the dumps) or emptiness.  Feelings of hopelessness or helplessness.  Tearfulness or episodes of crying (may be observed by others).  Irritability (children and adolescents). In addition to depressed mood or anhedonia or both, people with this disorder have at least four of the following symptoms:  Difficulty sleeping or sleeping too much.   Significant change (increase or decrease) in appetite or weight.   Lack of energy or  motivation.  Feelings of guilt and worthlessness.   Difficulty concentrating, remembering, or making decisions.  Unusually slow movement (psychomotor retardation) or restlessness (as observed by others).   Recurrent wishes for death, recurrent thoughts of self-harm (suicide), or a suicide attempt. People with major depressive disorder commonly have persistent negative thoughts about themselves, other people, and the world. People with severe major depressive disorder may experiencedistorted beliefs or perceptions about the world (psychotic delusions). They also may see or hear things that are not real (psychotic hallucinations). DIAGNOSIS Major depressive disorder is diagnosed through an assessment by your health care provider. Your health care provider will ask aboutaspects of your daily life, such as mood,sleep, and appetite, to see if you have the diagnostic symptoms of major depressive disorder. Your health care provider may ask about your medical history and use of alcohol or drugs, including prescription medicines. Your health care provider also may do a physical exam and blood work. This is because certain medical conditions and the use of certain substances can cause major depressive disorder-like symptoms (secondary depression). Your health care provider also may refer you to a mental health specialist for further evaluation and treatment. TREATMENT It is important to recognize the symptoms of major depressive disorder and seek treatment. The following treatments can be prescribed for this disorder:   Medicine. Antidepressant medicines usually are prescribed. Antidepressant medicines are thought to correct chemical imbalances in the brain that are commonly associated with major depressive disorder. Other types of medicine may be added if the symptoms do not respond to antidepressant medicines alone or if psychotic delusions or hallucinations occur.  Talk therapy. Talk therapy can be  helpful in treating major depressive disorder by providing   support, education, and guidance. Certain types of talk therapy also can help with negative thinking (cognitive behavioral therapy) and with relationship issues that trigger this disorder (interpersonal therapy). A mental health specialist can help determine which treatment is best for you. Most people with major depressive disorder do well with a combination of medicine and talk therapy. Treatments involving electrical stimulation of the brain can be used in situations with extremely severe symptoms or when medicine and talk therapy do not work over time. These treatments include electroconvulsive therapy, transcranial magnetic stimulation, and vagal nerve stimulation.   This information is not intended to replace advice given to you by your health care provider. Make sure you discuss any questions you have with your health care provider.   Document Released: 01/07/2013 Document Revised: 10/03/2014 Document Reviewed: 01/07/2013 Elsevier Interactive Patient Education 2016 [employer] Inc[employer]  

## 2015-12-22 NOTE — Progress Notes (Signed)
Pre visit review using our clinic review tool, if applicable. No additional management support is needed unless otherwise documented below in the visit note. 

## 2015-12-23 ENCOUNTER — Other Ambulatory Visit: Payer: Self-pay | Admitting: Internal Medicine

## 2015-12-23 DIAGNOSIS — J45909 Unspecified asthma, uncomplicated: Secondary | ICD-10-CM | POA: Diagnosis not present

## 2015-12-23 DIAGNOSIS — Z720 Tobacco use: Secondary | ICD-10-CM | POA: Diagnosis not present

## 2015-12-23 DIAGNOSIS — E119 Type 2 diabetes mellitus without complications: Secondary | ICD-10-CM | POA: Diagnosis not present

## 2015-12-23 DIAGNOSIS — K219 Gastro-esophageal reflux disease without esophagitis: Secondary | ICD-10-CM | POA: Diagnosis not present

## 2015-12-23 DIAGNOSIS — Z4682 Encounter for fitting and adjustment of non-vascular catheter: Secondary | ICD-10-CM | POA: Diagnosis not present

## 2015-12-23 DIAGNOSIS — E785 Hyperlipidemia, unspecified: Secondary | ICD-10-CM | POA: Diagnosis not present

## 2015-12-23 DIAGNOSIS — Z483 Aftercare following surgery for neoplasm: Secondary | ICD-10-CM | POA: Diagnosis not present

## 2015-12-23 DIAGNOSIS — I739 Peripheral vascular disease, unspecified: Secondary | ICD-10-CM | POA: Diagnosis not present

## 2015-12-23 DIAGNOSIS — C3411 Malignant neoplasm of upper lobe, right bronchus or lung: Secondary | ICD-10-CM | POA: Diagnosis not present

## 2015-12-28 ENCOUNTER — Telehealth (HOSPITAL_COMMUNITY): Payer: Self-pay | Admitting: *Deleted

## 2015-12-28 ENCOUNTER — Ambulatory Visit (HOSPITAL_COMMUNITY): Payer: Self-pay

## 2015-12-29 ENCOUNTER — Ambulatory Visit
Admission: RE | Admit: 2015-12-29 | Discharge: 2015-12-29 | Disposition: A | Discharge status: Home / Self Care | Payer: Medicare Other | Source: Ambulatory Visit | Attending: Thoracic Surgery (Cardiothoracic Vascular Surgery) | Admitting: Thoracic Surgery (Cardiothoracic Vascular Surgery)

## 2015-12-29 ENCOUNTER — Ambulatory Visit (INDEPENDENT_AMBULATORY_CARE_PROVIDER_SITE_OTHER): Payer: Medicare Other | Admitting: Thoracic Surgery (Cardiothoracic Vascular Surgery)

## 2015-12-29 ENCOUNTER — Ambulatory Visit: Payer: Self-pay | Admitting: Internal Medicine

## 2015-12-29 ENCOUNTER — Encounter: Payer: Self-pay | Admitting: Thoracic Surgery (Cardiothoracic Vascular Surgery)

## 2015-12-29 VITALS — BP 130/80 | HR 100 | Resp 20 | Ht 75.0 in | Wt 189.0 lb

## 2015-12-29 DIAGNOSIS — C3490 Malignant neoplasm of unspecified part of unspecified bronchus or lung: Secondary | ICD-10-CM | POA: Diagnosis not present

## 2015-12-29 DIAGNOSIS — Z902 Acquired absence of lung [part of]: Secondary | ICD-10-CM | POA: Diagnosis not present

## 2015-12-29 DIAGNOSIS — R911 Solitary pulmonary nodule: Secondary | ICD-10-CM

## 2015-12-29 DIAGNOSIS — C3491 Malignant neoplasm of unspecified part of right bronchus or lung: Secondary | ICD-10-CM | POA: Diagnosis not present
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
## 2015-12-30 ENCOUNTER — Telehealth: Payer: Self-pay | Admitting: *Deleted

## 2015-12-30 NOTE — Telephone Encounter (Signed)
Oncology Nurse Navigator Documentation  Oncology Nurse Navigator Flowsheets 12/30/2015  Navigator Location [facility]  Navigator Encounter Type Telephone  Treatment Phase Post-Tx Follow-up  Barriers/Navigation Needs Coordination of Care  Interventions Coordination of Care;Education Method  Coordination of Care Pulmonary Rehab  Education Method Verbal  Support Groups/Services Other  Acuity Level 2  Acuity Level 2 Referrals such as genetics, survivorship  Time Spent with Patient 30   I called patient to follow up on pulmonary rehab therapy.  He stated he did not go.  He stated he went though a depression and was unable to attend.  He stated he needed another referral to attend.  He would like to attend.  I updated patient I would help with referral to [facility].    I also asked him how his smoking cessation was going.  He stated he was still smoking.  I stated he was just going to put them down and quit.  I asked that he call me with further assistance to quit.  He stated he would.   Referral ordered Dr. Julien Nordmann.  Faxed referral sheet.

## 2015-12-31 DIAGNOSIS — E119 Type 2 diabetes mellitus without complications: Secondary | ICD-10-CM | POA: Diagnosis not present

## 2015-12-31 DIAGNOSIS — I739 Peripheral vascular disease, unspecified: Secondary | ICD-10-CM | POA: Diagnosis not present

## 2015-12-31 DIAGNOSIS — C3411 Malignant neoplasm of upper lobe, right bronchus or lung: Secondary | ICD-10-CM | POA: Diagnosis not present

## 2015-12-31 DIAGNOSIS — K219 Gastro-esophageal reflux disease without esophagitis: Secondary | ICD-10-CM | POA: Diagnosis not present

## 2015-12-31 DIAGNOSIS — Z483 Aftercare following surgery for neoplasm: Secondary | ICD-10-CM | POA: Diagnosis not present

## 2015-12-31 DIAGNOSIS — Z720 Tobacco use: Secondary | ICD-10-CM | POA: Diagnosis not present

## 2015-12-31 DIAGNOSIS — E785 Hyperlipidemia, unspecified: Secondary | ICD-10-CM | POA: Diagnosis not present

## 2015-12-31 DIAGNOSIS — Z4682 Encounter for fitting and adjustment of non-vascular catheter: Secondary | ICD-10-CM | POA: Diagnosis not present

## 2015-12-31 DIAGNOSIS — J45909 Unspecified asthma, uncomplicated: Secondary | ICD-10-CM | POA: Diagnosis not present

## 2016-01-04 ENCOUNTER — Ambulatory Visit: Payer: Medicare Other | Admitting: Physical Therapy

## 2016-01-04 ENCOUNTER — Ambulatory Visit: Payer: Medicare Other | Attending: Internal Medicine | Admitting: Physical Therapy

## 2016-01-06 ENCOUNTER — Ambulatory Visit: Payer: Medicare Other

## 2016-01-07 ENCOUNTER — Telehealth: Payer: Self-pay | Admitting: Internal Medicine

## 2016-01-07 DIAGNOSIS — C3411 Malignant neoplasm of upper lobe, right bronchus or lung: Secondary | ICD-10-CM | POA: Diagnosis not present

## 2016-01-07 DIAGNOSIS — Z4682 Encounter for fitting and adjustment of non-vascular catheter: Secondary | ICD-10-CM | POA: Diagnosis not present

## 2016-01-07 DIAGNOSIS — Z720 Tobacco use: Secondary | ICD-10-CM | POA: Diagnosis not present

## 2016-01-07 DIAGNOSIS — K219 Gastro-esophageal reflux disease without esophagitis: Secondary | ICD-10-CM | POA: Diagnosis not present

## 2016-01-07 DIAGNOSIS — Z483 Aftercare following surgery for neoplasm: Secondary | ICD-10-CM | POA: Diagnosis not present

## 2016-01-07 DIAGNOSIS — J45909 Unspecified asthma, uncomplicated: Secondary | ICD-10-CM | POA: Diagnosis not present

## 2016-01-07 DIAGNOSIS — E785 Hyperlipidemia, unspecified: Secondary | ICD-10-CM | POA: Diagnosis not present

## 2016-01-07 DIAGNOSIS — E119 Type 2 diabetes mellitus without complications: Secondary | ICD-10-CM | POA: Diagnosis not present

## 2016-01-07 DIAGNOSIS — I739 Peripheral vascular disease, unspecified: Secondary | ICD-10-CM | POA: Diagnosis not present

## 2016-01-07 NOTE — Telephone Encounter (Signed)
[facility] is calling for verbals on recert on skilled nursing   Copd management Pain mgmt Med management Nutrition

## 2016-01-07 NOTE — Telephone Encounter (Signed)
Notified nurse w/MD response...Johny Chess

## 2016-01-07 NOTE — Telephone Encounter (Signed)
Yes Yes Yes Yes

## 2016-01-13 ENCOUNTER — Ambulatory Visit: Payer: Medicare Other | Admitting: Physical Therapy

## 2016-01-13 ENCOUNTER — Telehealth: Payer: Self-pay | Admitting: *Deleted

## 2016-01-13 ENCOUNTER — Telehealth: Payer: Self-pay | Admitting: Internal Medicine

## 2016-01-13 DIAGNOSIS — I739 Peripheral vascular disease, unspecified: Secondary | ICD-10-CM | POA: Diagnosis not present

## 2016-01-13 DIAGNOSIS — E119 Type 2 diabetes mellitus without complications: Secondary | ICD-10-CM | POA: Diagnosis not present

## 2016-01-13 DIAGNOSIS — Z483 Aftercare following surgery for neoplasm: Secondary | ICD-10-CM | POA: Diagnosis not present

## 2016-01-13 DIAGNOSIS — C3411 Malignant neoplasm of upper lobe, right bronchus or lung: Secondary | ICD-10-CM | POA: Diagnosis not present

## 2016-01-13 DIAGNOSIS — Z4682 Encounter for fitting and adjustment of non-vascular catheter: Secondary | ICD-10-CM | POA: Diagnosis not present

## 2016-01-13 DIAGNOSIS — E785 Hyperlipidemia, unspecified: Secondary | ICD-10-CM | POA: Diagnosis not present

## 2016-01-13 DIAGNOSIS — J45909 Unspecified asthma, uncomplicated: Secondary | ICD-10-CM | POA: Diagnosis not present

## 2016-01-13 DIAGNOSIS — K219 Gastro-esophageal reflux disease without esophagitis: Secondary | ICD-10-CM | POA: Diagnosis not present

## 2016-01-13 DIAGNOSIS — Z720 Tobacco use: Secondary | ICD-10-CM | POA: Diagnosis not present

## 2016-01-13 NOTE — Telephone Encounter (Signed)
He is currently on observation for early stage lung cancer. Not sure if his weakness has anything to do with his weakness. He may need to reach out to his PCP for PT orders. Thank you.

## 2016-01-13 NOTE — Telephone Encounter (Signed)
Pt states he is going to PT on [facility], but has to walk 2 blocks to catch bus. Has arthritis in knees and gets SOB with ambulation. Is requesting to do PT at home with [facility]. States [facility] RN has seen him at home.  Please let pt know if Dr Julien Nordmann will order Home PT

## 2016-01-13 NOTE — Telephone Encounter (Signed)
Maudie Mercury is calling to advise that the patient had a fall yesterday walking the dog. Denies any injury.   Maudie Mercury is requesting verbals for PT to assess the patient

## 2016-01-13 NOTE — Telephone Encounter (Signed)
Oncology Nurse Navigator Documentation  Oncology Nurse Navigator Flowsheets 01/13/2016  Navigator Location [facility]  Navigator Encounter Type Telephone  Telephone Outgoing Call  Treatment Phase Post-Tx Follow-up  Barriers/Navigation Needs Coordination of Care  Interventions Coordination of Care  Support Groups/Services Other  Acuity Level 2  Acuity Level 2 Referrals such as genetics, survivorship  Time Spent with Patient 7   I received a message from triage today.  [name] is requesting having PT come to his home because he is unable to get to PT facility.  I spoke with Dr. Julien Nordmann.  He asked that I call PCP to update and order.  I called and left a vm message for PCP nurse to call me.  I then called [name] back and updated him on the above.  He was thankful for the call.

## 2016-01-13 NOTE — Telephone Encounter (Signed)
Called pt who advised He is awaiting a call from his PCP office.

## 2016-01-13 NOTE — Telephone Encounter (Signed)
VERFBAL GIVEN

## 2016-01-18 ENCOUNTER — Emergency Department (HOSPITAL_COMMUNITY)
Admission: EM | Admit: 2016-01-18 | Discharge: 2016-01-18 | Discharge status: Against Medical Advice | Payer: Medicare Other | Attending: Emergency Medicine | Admitting: Emergency Medicine

## 2016-01-18 ENCOUNTER — Encounter (HOSPITAL_COMMUNITY): Payer: Self-pay | Admitting: Emergency Medicine

## 2016-01-18 ENCOUNTER — Emergency Department (HOSPITAL_COMMUNITY): Payer: Medicare Other

## 2016-01-18 DIAGNOSIS — I10 Essential (primary) hypertension: Secondary | ICD-10-CM | POA: Diagnosis not present

## 2016-01-18 DIAGNOSIS — Z862 Personal history of diseases of the blood and blood-forming organs and certain disorders involving the immune mechanism: Secondary | ICD-10-CM | POA: Diagnosis not present

## 2016-01-18 DIAGNOSIS — S29001A Unspecified injury of muscle and tendon of front wall of thorax, initial encounter: Secondary | ICD-10-CM | POA: Insufficient documentation

## 2016-01-18 DIAGNOSIS — I509 Heart failure, unspecified: Secondary | ICD-10-CM | POA: Insufficient documentation

## 2016-01-18 DIAGNOSIS — Z9889 Other specified postprocedural states: Secondary | ICD-10-CM | POA: Diagnosis not present

## 2016-01-18 DIAGNOSIS — R079 Chest pain, unspecified: Secondary | ICD-10-CM

## 2016-01-18 DIAGNOSIS — Z86010 Personal history of colonic polyps: Secondary | ICD-10-CM | POA: Diagnosis not present

## 2016-01-18 DIAGNOSIS — Z8711 Personal history of peptic ulcer disease: Secondary | ICD-10-CM | POA: Diagnosis not present

## 2016-01-18 DIAGNOSIS — Z902 Acquired absence of lung [part of]: Secondary | ICD-10-CM | POA: Insufficient documentation

## 2016-01-18 DIAGNOSIS — R0789 Other chest pain: Secondary | ICD-10-CM | POA: Diagnosis not present

## 2016-01-18 DIAGNOSIS — Z98811 Dental restoration status: Secondary | ICD-10-CM | POA: Insufficient documentation

## 2016-01-18 DIAGNOSIS — S299XXA Unspecified injury of thorax, initial encounter: Secondary | ICD-10-CM | POA: Diagnosis present

## 2016-01-18 DIAGNOSIS — Z79899 Other long term (current) drug therapy: Secondary | ICD-10-CM | POA: Insufficient documentation

## 2016-01-18 DIAGNOSIS — Z973 Presence of spectacles and contact lenses: Secondary | ICD-10-CM | POA: Diagnosis not present

## 2016-01-18 DIAGNOSIS — S279XXA Injury of unspecified intrathoracic organ, initial encounter: Secondary | ICD-10-CM | POA: Diagnosis not present

## 2016-01-18 DIAGNOSIS — F17210 Nicotine dependence, cigarettes, uncomplicated: Secondary | ICD-10-CM | POA: Insufficient documentation

## 2016-01-18 DIAGNOSIS — Z9861 Coronary angioplasty status: Secondary | ICD-10-CM | POA: Insufficient documentation

## 2016-01-18 DIAGNOSIS — K219 Gastro-esophageal reflux disease without esophagitis: Secondary | ICD-10-CM | POA: Diagnosis not present

## 2016-01-18 DIAGNOSIS — Y9389 Activity, other specified: Secondary | ICD-10-CM | POA: Insufficient documentation

## 2016-01-18 DIAGNOSIS — I429 Cardiomyopathy, unspecified: Secondary | ICD-10-CM | POA: Insufficient documentation

## 2016-01-18 DIAGNOSIS — C3491 Malignant neoplasm of unspecified part of right bronchus or lung: Secondary | ICD-10-CM | POA: Diagnosis not present

## 2016-01-18 DIAGNOSIS — Y9289 Other specified places as the place of occurrence of the external cause: Secondary | ICD-10-CM | POA: Insufficient documentation

## 2016-01-18 DIAGNOSIS — Z8701 Personal history of pneumonia (recurrent): Secondary | ICD-10-CM | POA: Insufficient documentation

## 2016-01-18 DIAGNOSIS — Z5329 Procedure and treatment not carried out because of patient's decision for other reasons: Secondary | ICD-10-CM

## 2016-01-18 DIAGNOSIS — F209 Schizophrenia, unspecified: Secondary | ICD-10-CM | POA: Insufficient documentation

## 2016-01-18 DIAGNOSIS — E119 Type 2 diabetes mellitus without complications: Secondary | ICD-10-CM | POA: Diagnosis not present

## 2016-01-18 DIAGNOSIS — J441 Chronic obstructive pulmonary disease with (acute) exacerbation: Secondary | ICD-10-CM | POA: Diagnosis not present

## 2016-01-18 DIAGNOSIS — Z87438 Personal history of other diseases of male genital organs: Secondary | ICD-10-CM | POA: Diagnosis not present

## 2016-01-18 DIAGNOSIS — Z8619 Personal history of other infectious and parasitic diseases: Secondary | ICD-10-CM | POA: Insufficient documentation

## 2016-01-18 DIAGNOSIS — Y998 Other external cause status: Secondary | ICD-10-CM | POA: Diagnosis not present

## 2016-01-18 DIAGNOSIS — Z5321 Procedure and treatment not carried out due to patient leaving prior to being seen by health care provider: Secondary | ICD-10-CM | POA: Insufficient documentation

## 2016-01-18 DIAGNOSIS — Z5320 Procedure and treatment not carried out because of patient's decision for unspecified reasons: Secondary | ICD-10-CM

## 2016-01-18 LAB — CBC WITH DIFFERENTIAL/PLATELET
Basophils Absolute: 0 10*3/uL (ref 0.0–0.1)
Basophils Relative: 0 %
EOS PCT: 1 %
Eosinophils Absolute: 0.1 10*3/uL (ref 0.0–0.7)
HEMATOCRIT: 40 % (ref 39.0–52.0)
HEMOGLOBIN: 12.7 g/dL — AB (ref 13.0–17.0)
LYMPHS ABS: 2 10*3/uL (ref 0.7–4.0)
LYMPHS PCT: 37 %
MCH: 24.9 pg — AB (ref 26.0–34.0)
MCHC: 31.8 g/dL (ref 30.0–36.0)
MCV: 78.4 fL (ref 78.0–100.0)
Monocytes Absolute: 0.7 10*3/uL (ref 0.1–1.0)
Monocytes Relative: 13 %
NEUTROS ABS: 2.7 10*3/uL (ref 1.7–7.7)
Neutrophils Relative %: 49 %
Platelets: 139 10*3/uL — ABNORMAL LOW (ref 150–400)
RBC: 5.1 MIL/uL (ref 4.22–5.81)
RDW: 17.7 % — ABNORMAL HIGH (ref 11.5–15.5)
WBC: 5.5 10*3/uL (ref 4.0–10.5)

## 2016-01-18 LAB — COMPREHENSIVE METABOLIC PANEL
ALK PHOS: 67 U/L (ref 38–126)
ALT: 28 U/L (ref 17–63)
AST: 28 U/L (ref 15–41)
Albumin: 3.6 g/dL (ref 3.5–5.0)
Anion gap: 12 (ref 5–15)
BUN: 5 mg/dL — AB (ref 6–20)
CALCIUM: 9.2 mg/dL (ref 8.9–10.3)
CO2: 24 mmol/L (ref 22–32)
CREATININE: 1 mg/dL (ref 0.61–1.24)
Chloride: 108 mmol/L (ref 101–111)
Glucose, Bld: 118 mg/dL — ABNORMAL HIGH (ref 65–99)
Potassium: 3.6 mmol/L (ref 3.5–5.1)
Sodium: 144 mmol/L (ref 135–145)
Total Bilirubin: 0.5 mg/dL (ref 0.3–1.2)
Total Protein: 7.2 g/dL (ref 6.5–8.1)

## 2016-01-18 LAB — BRAIN NATRIURETIC PEPTIDE: B Natriuretic Peptide: 19.5 pg/mL (ref 0.0–100.0)

## 2016-01-18 LAB — I-STAT TROPONIN, ED: TROPONIN I, POC: 0.01 ng/mL (ref 0.00–0.08)

## 2016-01-18 LAB — LIPASE, BLOOD: Lipase: 37 U/L (ref 11–51)

## 2016-01-18 MED ORDER — DM-GUAIFENESIN ER 30-600 MG PO TB12: 1.0000 | ORAL_TABLET | Freq: Two times a day (BID) | ORAL | Status: DC | PRN

## 2016-01-18 MED ORDER — IPRATROPIUM-ALBUTEROL 0.5-2.5 (3) MG/3ML IN SOLN
3.0000 mL | Freq: Once | RESPIRATORY_TRACT | Status: AC
  Administered 2016-01-18: 3 mL via RESPIRATORY_TRACT
  Filled 2016-01-18: qty 3

## 2016-01-18 MED ORDER — ALBUTEROL SULFATE HFA 108 (90 BASE) MCG/ACT IN AERS: 1.0000 | INHALATION_SPRAY | RESPIRATORY_TRACT | Status: DC | PRN

## 2016-01-18 MED ORDER — SODIUM CHLORIDE 0.9 % IV BOLUS (SEPSIS)
1000.0000 mL | Freq: Once | INTRAVENOUS | Status: AC
  Administered 2016-01-18: 1000 mL via INTRAVENOUS

## 2016-01-18 MED ORDER — KETOROLAC TROMETHAMINE 15 MG/ML IJ SOLN
15.0000 mg | Freq: Once | INTRAMUSCULAR | Status: DC
  Filled 2016-01-18: qty 1
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
## 2016-01-18 NOTE — ED Notes (Signed)
Patient first declined toradol, then when started to dispose, changed his mind. Only vial disposed, unable to scan medication in syringe. Gave toradol prior to discharge.
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
## 2016-01-18 NOTE — ED Notes (Signed)
Pt requesting to leave AMA, explained to pt that EDP would be by to go over results. Pt then proceeds to call this RN a "smart*ss" EDP at bedside. Will DC AMA

## 2016-01-18 NOTE — ED Notes (Signed)
MD at bedside. 

## 2016-01-19 ENCOUNTER — Telehealth: Payer: Self-pay

## 2016-01-19 DIAGNOSIS — F418 Other specified anxiety disorders: Secondary | ICD-10-CM

## 2016-01-19 NOTE — Telephone Encounter (Signed)
This has been changed to [name]

## 2016-01-19 NOTE — Telephone Encounter (Signed)
Recd faxed rx refill request for paxil '30mg'$  tab from [facility] on [address]---i don't show this med to be current med on patient's med list---please advise, thanks

## 2016-01-20 ENCOUNTER — Ambulatory Visit: Payer: Medicare Other | Admitting: Podiatry

## 2016-01-20 DIAGNOSIS — Z720 Tobacco use: Secondary | ICD-10-CM | POA: Diagnosis not present

## 2016-01-20 DIAGNOSIS — Z4682 Encounter for fitting and adjustment of non-vascular catheter: Secondary | ICD-10-CM | POA: Diagnosis not present

## 2016-01-20 DIAGNOSIS — E119 Type 2 diabetes mellitus without complications: Secondary | ICD-10-CM | POA: Diagnosis not present

## 2016-01-20 DIAGNOSIS — Z483 Aftercare following surgery for neoplasm: Secondary | ICD-10-CM | POA: Diagnosis not present

## 2016-01-20 DIAGNOSIS — E785 Hyperlipidemia, unspecified: Secondary | ICD-10-CM | POA: Diagnosis not present

## 2016-01-20 DIAGNOSIS — J45909 Unspecified asthma, uncomplicated: Secondary | ICD-10-CM | POA: Diagnosis not present

## 2016-01-20 DIAGNOSIS — C3411 Malignant neoplasm of upper lobe, right bronchus or lung: Secondary | ICD-10-CM | POA: Diagnosis not present

## 2016-01-20 DIAGNOSIS — I739 Peripheral vascular disease, unspecified: Secondary | ICD-10-CM | POA: Diagnosis not present

## 2016-01-20 DIAGNOSIS — K219 Gastro-esophageal reflux disease without esophagitis: Secondary | ICD-10-CM | POA: Diagnosis not present

## 2016-01-25 DIAGNOSIS — Z4682 Encounter for fitting and adjustment of non-vascular catheter: Secondary | ICD-10-CM | POA: Diagnosis not present

## 2016-01-25 DIAGNOSIS — I739 Peripheral vascular disease, unspecified: Secondary | ICD-10-CM | POA: Diagnosis not present

## 2016-01-25 DIAGNOSIS — J45909 Unspecified asthma, uncomplicated: Secondary | ICD-10-CM | POA: Diagnosis not present

## 2016-01-25 DIAGNOSIS — E785 Hyperlipidemia, unspecified: Secondary | ICD-10-CM | POA: Diagnosis not present

## 2016-01-25 DIAGNOSIS — Z720 Tobacco use: Secondary | ICD-10-CM | POA: Diagnosis not present

## 2016-01-25 DIAGNOSIS — Z483 Aftercare following surgery for neoplasm: Secondary | ICD-10-CM | POA: Diagnosis not present

## 2016-01-25 DIAGNOSIS — E119 Type 2 diabetes mellitus without complications: Secondary | ICD-10-CM | POA: Diagnosis not present

## 2016-01-25 DIAGNOSIS — C3411 Malignant neoplasm of upper lobe, right bronchus or lung: Secondary | ICD-10-CM | POA: Diagnosis not present

## 2016-01-25 DIAGNOSIS — K219 Gastro-esophageal reflux disease without esophagitis: Secondary | ICD-10-CM | POA: Diagnosis not present

## 2016-01-26 DIAGNOSIS — I739 Peripheral vascular disease, unspecified: Secondary | ICD-10-CM | POA: Diagnosis not present

## 2016-01-26 DIAGNOSIS — C3411 Malignant neoplasm of upper lobe, right bronchus or lung: Secondary | ICD-10-CM | POA: Diagnosis not present

## 2016-01-26 DIAGNOSIS — K219 Gastro-esophageal reflux disease without esophagitis: Secondary | ICD-10-CM | POA: Diagnosis not present

## 2016-01-26 DIAGNOSIS — Z4682 Encounter for fitting and adjustment of non-vascular catheter: Secondary | ICD-10-CM | POA: Diagnosis not present

## 2016-01-26 DIAGNOSIS — Z483 Aftercare following surgery for neoplasm: Secondary | ICD-10-CM | POA: Diagnosis not present

## 2016-01-26 DIAGNOSIS — Z720 Tobacco use: Secondary | ICD-10-CM | POA: Diagnosis not present

## 2016-01-26 DIAGNOSIS — J45909 Unspecified asthma, uncomplicated: Secondary | ICD-10-CM | POA: Diagnosis not present

## 2016-01-26 DIAGNOSIS — E119 Type 2 diabetes mellitus without complications: Secondary | ICD-10-CM | POA: Diagnosis not present

## 2016-01-26 DIAGNOSIS — E785 Hyperlipidemia, unspecified: Secondary | ICD-10-CM | POA: Diagnosis not present

## 2016-01-27 DIAGNOSIS — E119 Type 2 diabetes mellitus without complications: Secondary | ICD-10-CM | POA: Diagnosis not present

## 2016-01-27 DIAGNOSIS — C3411 Malignant neoplasm of upper lobe, right bronchus or lung: Secondary | ICD-10-CM | POA: Diagnosis not present

## 2016-01-27 DIAGNOSIS — Z483 Aftercare following surgery for neoplasm: Secondary | ICD-10-CM | POA: Diagnosis not present

## 2016-01-27 DIAGNOSIS — E785 Hyperlipidemia, unspecified: Secondary | ICD-10-CM | POA: Diagnosis not present

## 2016-01-27 DIAGNOSIS — K219 Gastro-esophageal reflux disease without esophagitis: Secondary | ICD-10-CM | POA: Diagnosis not present

## 2016-01-27 DIAGNOSIS — Z4682 Encounter for fitting and adjustment of non-vascular catheter: Secondary | ICD-10-CM | POA: Diagnosis not present

## 2016-01-27 DIAGNOSIS — I739 Peripheral vascular disease, unspecified: Secondary | ICD-10-CM | POA: Diagnosis not present

## 2016-01-27 DIAGNOSIS — J45909 Unspecified asthma, uncomplicated: Secondary | ICD-10-CM | POA: Diagnosis not present

## 2016-01-27 DIAGNOSIS — Z720 Tobacco use: Secondary | ICD-10-CM | POA: Diagnosis not present

## 2016-01-29 DIAGNOSIS — E119 Type 2 diabetes mellitus without complications: Secondary | ICD-10-CM | POA: Diagnosis not present

## 2016-01-29 DIAGNOSIS — C3411 Malignant neoplasm of upper lobe, right bronchus or lung: Secondary | ICD-10-CM | POA: Diagnosis not present

## 2016-01-29 DIAGNOSIS — J45909 Unspecified asthma, uncomplicated: Secondary | ICD-10-CM | POA: Diagnosis not present

## 2016-01-29 DIAGNOSIS — Z720 Tobacco use: Secondary | ICD-10-CM | POA: Diagnosis not present

## 2016-01-29 DIAGNOSIS — I739 Peripheral vascular disease, unspecified: Secondary | ICD-10-CM | POA: Diagnosis not present

## 2016-01-29 DIAGNOSIS — Z4682 Encounter for fitting and adjustment of non-vascular catheter: Secondary | ICD-10-CM | POA: Diagnosis not present

## 2016-01-29 DIAGNOSIS — Z483 Aftercare following surgery for neoplasm: Secondary | ICD-10-CM | POA: Diagnosis not present

## 2016-01-29 DIAGNOSIS — K219 Gastro-esophageal reflux disease without esophagitis: Secondary | ICD-10-CM | POA: Diagnosis not present

## 2016-01-29 DIAGNOSIS — E785 Hyperlipidemia, unspecified: Secondary | ICD-10-CM | POA: Diagnosis not present

## 2016-02-01 DIAGNOSIS — K219 Gastro-esophageal reflux disease without esophagitis: Secondary | ICD-10-CM | POA: Diagnosis not present

## 2016-02-01 DIAGNOSIS — J45909 Unspecified asthma, uncomplicated: Secondary | ICD-10-CM | POA: Diagnosis not present

## 2016-02-01 DIAGNOSIS — I739 Peripheral vascular disease, unspecified: Secondary | ICD-10-CM | POA: Diagnosis not present

## 2016-02-01 DIAGNOSIS — E119 Type 2 diabetes mellitus without complications: Secondary | ICD-10-CM | POA: Diagnosis not present

## 2016-02-01 DIAGNOSIS — Z483 Aftercare following surgery for neoplasm: Secondary | ICD-10-CM | POA: Diagnosis not present

## 2016-02-01 DIAGNOSIS — E785 Hyperlipidemia, unspecified: Secondary | ICD-10-CM | POA: Diagnosis not present

## 2016-02-01 DIAGNOSIS — Z720 Tobacco use: Secondary | ICD-10-CM | POA: Diagnosis not present

## 2016-02-01 DIAGNOSIS — Z4682 Encounter for fitting and adjustment of non-vascular catheter: Secondary | ICD-10-CM | POA: Diagnosis not present

## 2016-02-01 DIAGNOSIS — C3411 Malignant neoplasm of upper lobe, right bronchus or lung: Secondary | ICD-10-CM | POA: Diagnosis not present

## 2016-02-03 DIAGNOSIS — J45909 Unspecified asthma, uncomplicated: Secondary | ICD-10-CM | POA: Diagnosis not present

## 2016-02-03 DIAGNOSIS — K219 Gastro-esophageal reflux disease without esophagitis: Secondary | ICD-10-CM | POA: Diagnosis not present

## 2016-02-03 DIAGNOSIS — C3411 Malignant neoplasm of upper lobe, right bronchus or lung: Secondary | ICD-10-CM | POA: Diagnosis not present

## 2016-02-03 DIAGNOSIS — Z720 Tobacco use: Secondary | ICD-10-CM | POA: Diagnosis not present

## 2016-02-03 DIAGNOSIS — E119 Type 2 diabetes mellitus without complications: Secondary | ICD-10-CM | POA: Diagnosis not present

## 2016-02-03 DIAGNOSIS — Z483 Aftercare following surgery for neoplasm: Secondary | ICD-10-CM | POA: Diagnosis not present

## 2016-02-03 DIAGNOSIS — F1010 Alcohol abuse, uncomplicated: Secondary | ICD-10-CM | POA: Diagnosis not present

## 2016-02-03 DIAGNOSIS — F209 Schizophrenia, unspecified: Secondary | ICD-10-CM | POA: Diagnosis not present

## 2016-02-03 DIAGNOSIS — Z4682 Encounter for fitting and adjustment of non-vascular catheter: Secondary | ICD-10-CM | POA: Diagnosis not present

## 2016-02-03 DIAGNOSIS — B1910 Unspecified viral hepatitis B without hepatic coma: Secondary | ICD-10-CM | POA: Diagnosis not present

## 2016-02-03 DIAGNOSIS — I739 Peripheral vascular disease, unspecified: Secondary | ICD-10-CM | POA: Diagnosis not present

## 2016-02-03 DIAGNOSIS — E785 Hyperlipidemia, unspecified: Secondary | ICD-10-CM | POA: Diagnosis not present

## 2016-02-05 DIAGNOSIS — I739 Peripheral vascular disease, unspecified: Secondary | ICD-10-CM | POA: Diagnosis not present

## 2016-02-05 DIAGNOSIS — Z720 Tobacco use: Secondary | ICD-10-CM | POA: Diagnosis not present

## 2016-02-05 DIAGNOSIS — Z4682 Encounter for fitting and adjustment of non-vascular catheter: Secondary | ICD-10-CM | POA: Diagnosis not present

## 2016-02-05 DIAGNOSIS — J45909 Unspecified asthma, uncomplicated: Secondary | ICD-10-CM | POA: Diagnosis not present

## 2016-02-05 DIAGNOSIS — C3411 Malignant neoplasm of upper lobe, right bronchus or lung: Secondary | ICD-10-CM | POA: Diagnosis not present

## 2016-02-05 DIAGNOSIS — B1910 Unspecified viral hepatitis B without hepatic coma: Secondary | ICD-10-CM | POA: Diagnosis not present

## 2016-02-05 DIAGNOSIS — F209 Schizophrenia, unspecified: Secondary | ICD-10-CM | POA: Diagnosis not present

## 2016-02-05 DIAGNOSIS — K219 Gastro-esophageal reflux disease without esophagitis: Secondary | ICD-10-CM | POA: Diagnosis not present

## 2016-02-05 DIAGNOSIS — E119 Type 2 diabetes mellitus without complications: Secondary | ICD-10-CM | POA: Diagnosis not present

## 2016-02-05 DIAGNOSIS — F1010 Alcohol abuse, uncomplicated: Secondary | ICD-10-CM | POA: Diagnosis not present

## 2016-02-05 DIAGNOSIS — E785 Hyperlipidemia, unspecified: Secondary | ICD-10-CM | POA: Diagnosis not present

## 2016-02-05 DIAGNOSIS — Z483 Aftercare following surgery for neoplasm: Secondary | ICD-10-CM | POA: Diagnosis not present

## 2016-02-08 DIAGNOSIS — C3411 Malignant neoplasm of upper lobe, right bronchus or lung: Secondary | ICD-10-CM | POA: Diagnosis not present

## 2016-02-08 DIAGNOSIS — Z483 Aftercare following surgery for neoplasm: Secondary | ICD-10-CM | POA: Diagnosis not present

## 2016-02-08 DIAGNOSIS — F209 Schizophrenia, unspecified: Secondary | ICD-10-CM | POA: Diagnosis not present

## 2016-02-08 DIAGNOSIS — J45909 Unspecified asthma, uncomplicated: Secondary | ICD-10-CM | POA: Diagnosis not present

## 2016-02-08 DIAGNOSIS — F1010 Alcohol abuse, uncomplicated: Secondary | ICD-10-CM | POA: Diagnosis not present

## 2016-02-08 DIAGNOSIS — E785 Hyperlipidemia, unspecified: Secondary | ICD-10-CM | POA: Diagnosis not present

## 2016-02-08 DIAGNOSIS — B1910 Unspecified viral hepatitis B without hepatic coma: Secondary | ICD-10-CM | POA: Diagnosis not present

## 2016-02-08 DIAGNOSIS — I739 Peripheral vascular disease, unspecified: Secondary | ICD-10-CM | POA: Diagnosis not present

## 2016-02-08 DIAGNOSIS — Z720 Tobacco use: Secondary | ICD-10-CM | POA: Diagnosis not present

## 2016-02-08 DIAGNOSIS — Z4682 Encounter for fitting and adjustment of non-vascular catheter: Secondary | ICD-10-CM | POA: Diagnosis not present

## 2016-02-08 DIAGNOSIS — E119 Type 2 diabetes mellitus without complications: Secondary | ICD-10-CM | POA: Diagnosis not present

## 2016-02-08 DIAGNOSIS — K219 Gastro-esophageal reflux disease without esophagitis: Secondary | ICD-10-CM | POA: Diagnosis not present

## 2016-02-10 DIAGNOSIS — F209 Schizophrenia, unspecified: Secondary | ICD-10-CM | POA: Diagnosis not present

## 2016-02-10 DIAGNOSIS — K219 Gastro-esophageal reflux disease without esophagitis: Secondary | ICD-10-CM | POA: Diagnosis not present

## 2016-02-10 DIAGNOSIS — I739 Peripheral vascular disease, unspecified: Secondary | ICD-10-CM | POA: Diagnosis not present

## 2016-02-10 DIAGNOSIS — C3411 Malignant neoplasm of upper lobe, right bronchus or lung: Secondary | ICD-10-CM | POA: Diagnosis not present

## 2016-02-10 DIAGNOSIS — Z720 Tobacco use: Secondary | ICD-10-CM | POA: Diagnosis not present

## 2016-02-10 DIAGNOSIS — Z4682 Encounter for fitting and adjustment of non-vascular catheter: Secondary | ICD-10-CM | POA: Diagnosis not present

## 2016-02-10 DIAGNOSIS — J45909 Unspecified asthma, uncomplicated: Secondary | ICD-10-CM | POA: Diagnosis not present

## 2016-02-10 DIAGNOSIS — E119 Type 2 diabetes mellitus without complications: Secondary | ICD-10-CM | POA: Diagnosis not present

## 2016-02-10 DIAGNOSIS — F1010 Alcohol abuse, uncomplicated: Secondary | ICD-10-CM | POA: Diagnosis not present

## 2016-02-10 DIAGNOSIS — Z483 Aftercare following surgery for neoplasm: Secondary | ICD-10-CM | POA: Diagnosis not present

## 2016-02-10 DIAGNOSIS — B1910 Unspecified viral hepatitis B without hepatic coma: Secondary | ICD-10-CM | POA: Diagnosis not present

## 2016-02-10 DIAGNOSIS — E785 Hyperlipidemia, unspecified: Secondary | ICD-10-CM | POA: Diagnosis not present

## 2016-02-12 ENCOUNTER — Telehealth: Payer: Self-pay | Admitting: Internal Medicine

## 2016-02-12 DIAGNOSIS — B1910 Unspecified viral hepatitis B without hepatic coma: Secondary | ICD-10-CM | POA: Diagnosis not present

## 2016-02-12 DIAGNOSIS — E119 Type 2 diabetes mellitus without complications: Secondary | ICD-10-CM | POA: Diagnosis not present

## 2016-02-12 DIAGNOSIS — J45909 Unspecified asthma, uncomplicated: Secondary | ICD-10-CM | POA: Diagnosis not present

## 2016-02-12 DIAGNOSIS — F209 Schizophrenia, unspecified: Secondary | ICD-10-CM | POA: Diagnosis not present

## 2016-02-12 DIAGNOSIS — Z720 Tobacco use: Secondary | ICD-10-CM | POA: Diagnosis not present

## 2016-02-12 DIAGNOSIS — I739 Peripheral vascular disease, unspecified: Secondary | ICD-10-CM | POA: Diagnosis not present

## 2016-02-12 DIAGNOSIS — F1010 Alcohol abuse, uncomplicated: Secondary | ICD-10-CM | POA: Diagnosis not present

## 2016-02-12 DIAGNOSIS — Z4682 Encounter for fitting and adjustment of non-vascular catheter: Secondary | ICD-10-CM | POA: Diagnosis not present

## 2016-02-12 DIAGNOSIS — E785 Hyperlipidemia, unspecified: Secondary | ICD-10-CM | POA: Diagnosis not present

## 2016-02-12 DIAGNOSIS — C3411 Malignant neoplasm of upper lobe, right bronchus or lung: Secondary | ICD-10-CM | POA: Diagnosis not present

## 2016-02-12 DIAGNOSIS — Z483 Aftercare following surgery for neoplasm: Secondary | ICD-10-CM | POA: Diagnosis not present

## 2016-02-12 DIAGNOSIS — K219 Gastro-esophageal reflux disease without esophagitis: Secondary | ICD-10-CM | POA: Diagnosis not present

## 2016-02-12 NOTE — Telephone Encounter (Signed)
LMVOM

## 2016-02-12 NOTE — Telephone Encounter (Signed)
[name] [phone] [facility]   Need veral orders Home PT  3 week 4 Strengthen, balance and fall prevention

## 2016-02-12 NOTE — Telephone Encounter (Signed)
Verbals iven

## 2016-02-16 ENCOUNTER — Ambulatory Visit: Payer: Self-pay | Admitting: Internal Medicine

## 2016-02-17 DIAGNOSIS — E785 Hyperlipidemia, unspecified: Secondary | ICD-10-CM | POA: Diagnosis not present

## 2016-02-17 DIAGNOSIS — F1010 Alcohol abuse, uncomplicated: Secondary | ICD-10-CM | POA: Diagnosis not present

## 2016-02-17 DIAGNOSIS — K219 Gastro-esophageal reflux disease without esophagitis: Secondary | ICD-10-CM | POA: Diagnosis not present

## 2016-02-17 DIAGNOSIS — B1910 Unspecified viral hepatitis B without hepatic coma: Secondary | ICD-10-CM | POA: Diagnosis not present

## 2016-02-17 DIAGNOSIS — C3411 Malignant neoplasm of upper lobe, right bronchus or lung: Secondary | ICD-10-CM | POA: Diagnosis not present

## 2016-02-17 DIAGNOSIS — J45909 Unspecified asthma, uncomplicated: Secondary | ICD-10-CM | POA: Diagnosis not present

## 2016-02-17 DIAGNOSIS — Z483 Aftercare following surgery for neoplasm: Secondary | ICD-10-CM | POA: Diagnosis not present

## 2016-02-17 DIAGNOSIS — Z720 Tobacco use: Secondary | ICD-10-CM | POA: Diagnosis not present

## 2016-02-17 DIAGNOSIS — I739 Peripheral vascular disease, unspecified: Secondary | ICD-10-CM | POA: Diagnosis not present

## 2016-02-17 DIAGNOSIS — F209 Schizophrenia, unspecified: Secondary | ICD-10-CM | POA: Diagnosis not present

## 2016-02-17 DIAGNOSIS — Z4682 Encounter for fitting and adjustment of non-vascular catheter: Secondary | ICD-10-CM | POA: Diagnosis not present

## 2016-02-17 DIAGNOSIS — E119 Type 2 diabetes mellitus without complications: Secondary | ICD-10-CM | POA: Diagnosis not present

## 2016-02-22 DIAGNOSIS — F1010 Alcohol abuse, uncomplicated: Secondary | ICD-10-CM | POA: Diagnosis not present

## 2016-02-22 DIAGNOSIS — I739 Peripheral vascular disease, unspecified: Secondary | ICD-10-CM | POA: Diagnosis not present

## 2016-02-22 DIAGNOSIS — Z720 Tobacco use: Secondary | ICD-10-CM | POA: Diagnosis not present

## 2016-02-22 DIAGNOSIS — K219 Gastro-esophageal reflux disease without esophagitis: Secondary | ICD-10-CM | POA: Diagnosis not present

## 2016-02-22 DIAGNOSIS — B1910 Unspecified viral hepatitis B without hepatic coma: Secondary | ICD-10-CM | POA: Diagnosis not present

## 2016-02-22 DIAGNOSIS — Z4682 Encounter for fitting and adjustment of non-vascular catheter: Secondary | ICD-10-CM | POA: Diagnosis not present

## 2016-02-22 DIAGNOSIS — C3411 Malignant neoplasm of upper lobe, right bronchus or lung: Secondary | ICD-10-CM | POA: Diagnosis not present

## 2016-02-22 DIAGNOSIS — E119 Type 2 diabetes mellitus without complications: Secondary | ICD-10-CM | POA: Diagnosis not present

## 2016-02-22 DIAGNOSIS — E785 Hyperlipidemia, unspecified: Secondary | ICD-10-CM | POA: Diagnosis not present

## 2016-02-22 DIAGNOSIS — J45909 Unspecified asthma, uncomplicated: Secondary | ICD-10-CM | POA: Diagnosis not present

## 2016-02-22 DIAGNOSIS — F209 Schizophrenia, unspecified: Secondary | ICD-10-CM | POA: Diagnosis not present

## 2016-02-22 DIAGNOSIS — Z483 Aftercare following surgery for neoplasm: Secondary | ICD-10-CM | POA: Diagnosis not present

## 2016-02-23 IMAGING — CT CT ABD-PELV W/ CM
1 of 3 series · 13 of 32 positions shown, 18 images · IV contrast (OMNIPAQUE 300)
Comparison: 02/21/2006 abdominal ultrasound.  No prior C2.

CLINICAL DATA: Bloody stools. No pain. History of common iliac
artery stent. Peptic ulcer.

EXAM:
CT ABDOMEN AND PELVIS WITH CONTRAST
TECHNIQUE: Multidetector CT imaging of the abdomen and pelvis was performed
using the standard protocol following bolus administration of
intravenous contrast.
CONTRAST:  25mL OMNIPAQUE IOHEXOL 300 MG/ML SOLN, 100mL OMNIPAQUE
IOHEXOL 300 MG/ML SOLN

[Series 2: abd/pel with · axial · 0.76mm/px · z∈[+1111,+1526]mm · 13 of 95 slices shown, 18 images]
[im 6/95  soft-tissue]
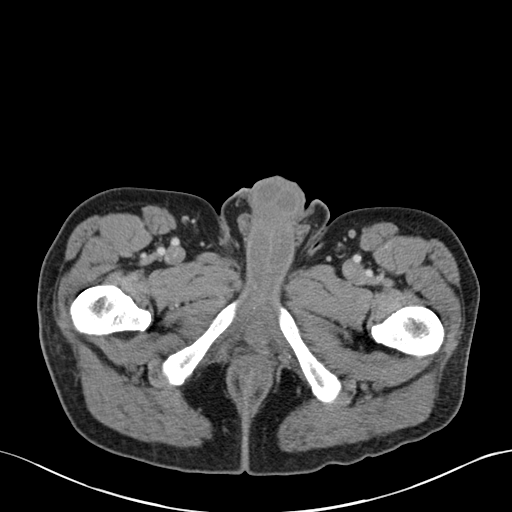
[im 6/95  bone]
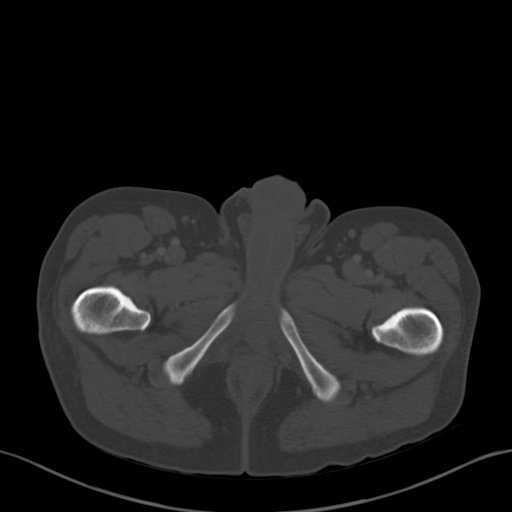
[im 16/95  soft-tissue]
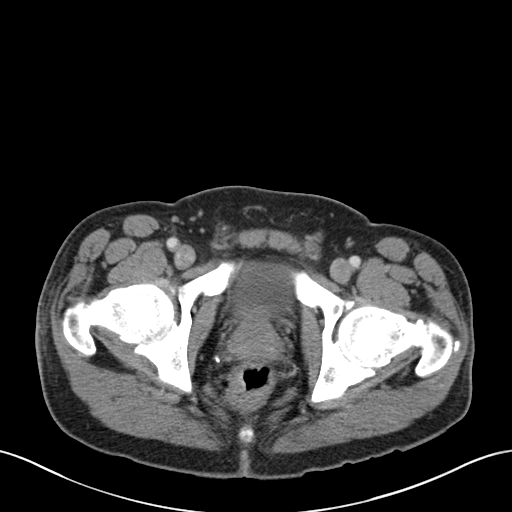
[im 21/95  soft-tissue]
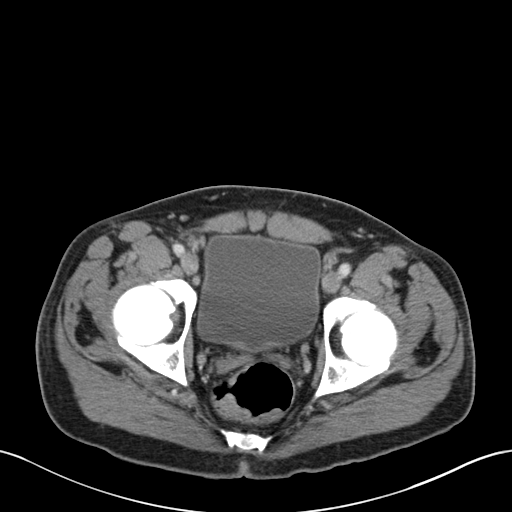
[im 27/95  soft-tissue]
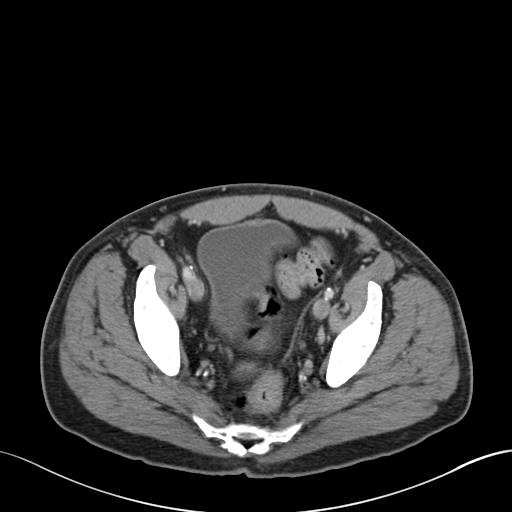
[im 37/95  soft-tissue]
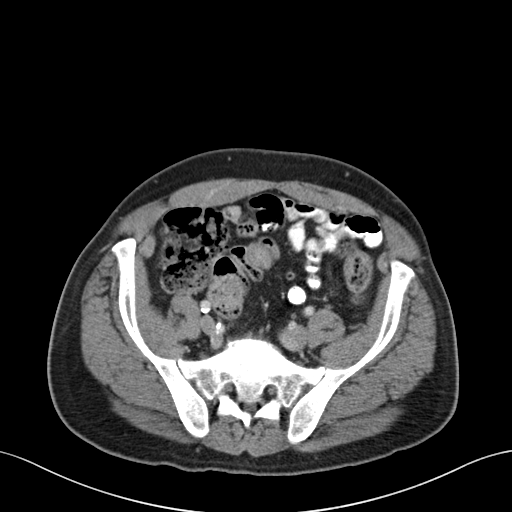
[im 42/95  soft-tissue]
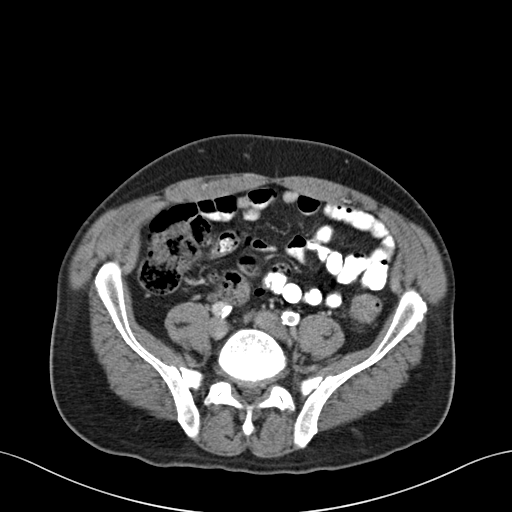
[im 53/95  soft-tissue]
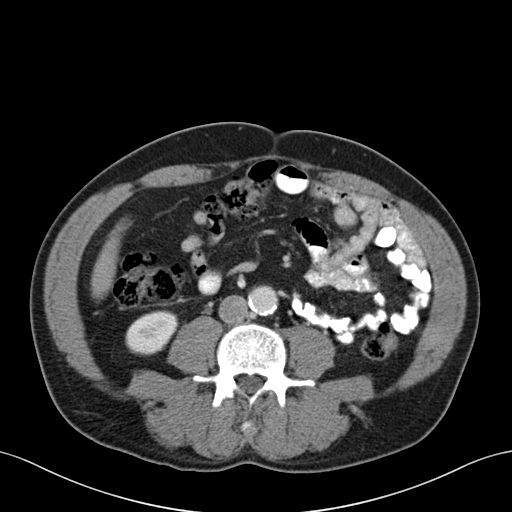
[im 58/95  soft-tissue]
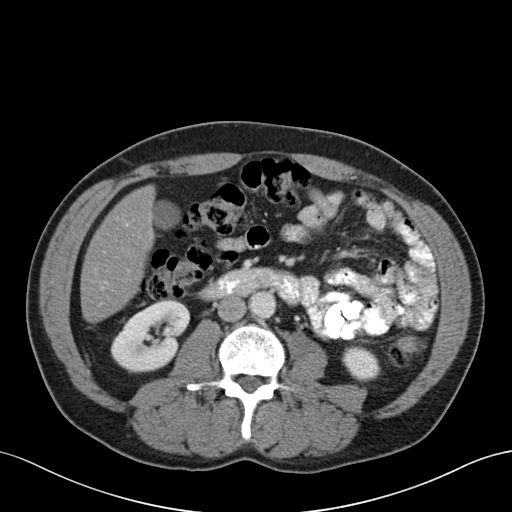
[im 68/95  soft-tissue]
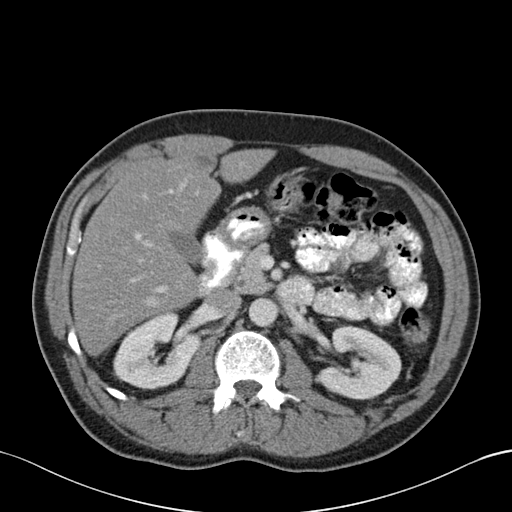
[im 68/95  bone]
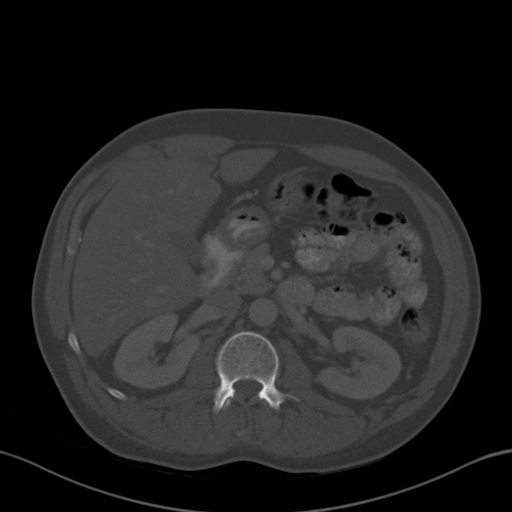
[im 74/95  soft-tissue]
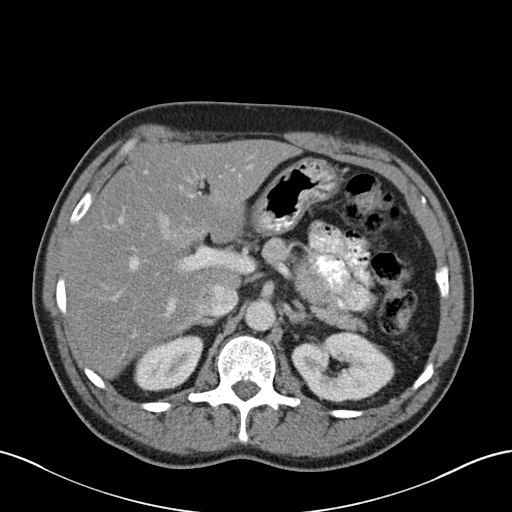
[im 74/95  lung]
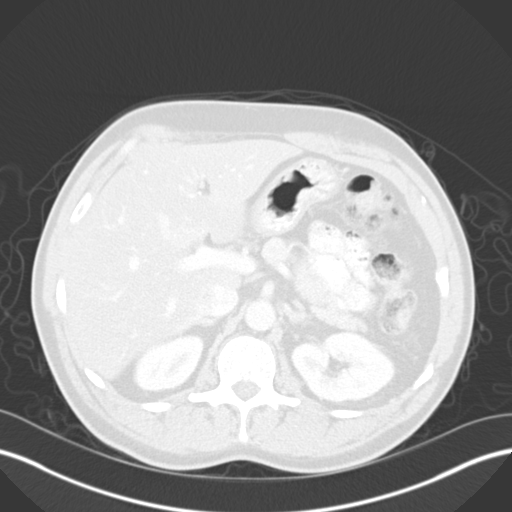
[im 79/95  soft-tissue]
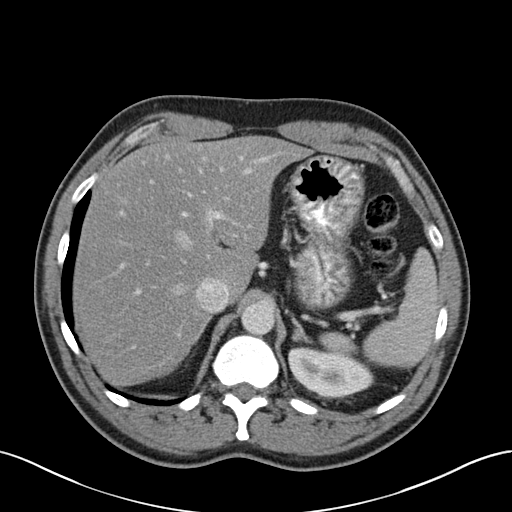
[im 79/95  lung]
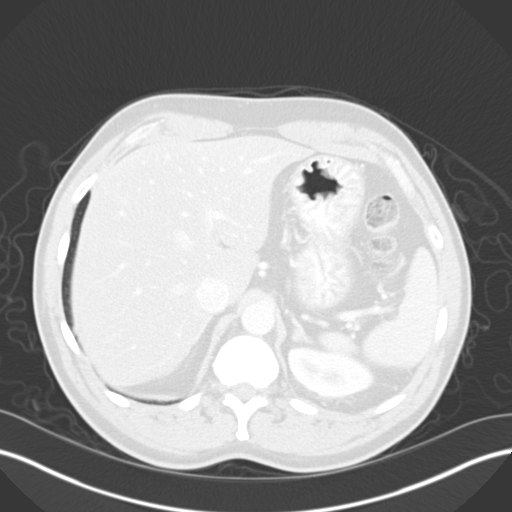
[im 84/95  lung]
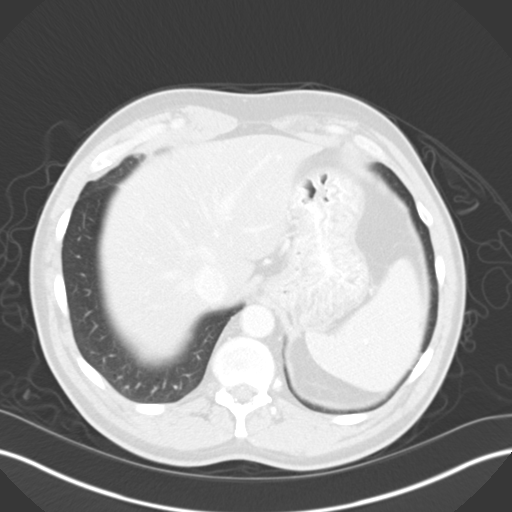
[im 89/95  soft-tissue]
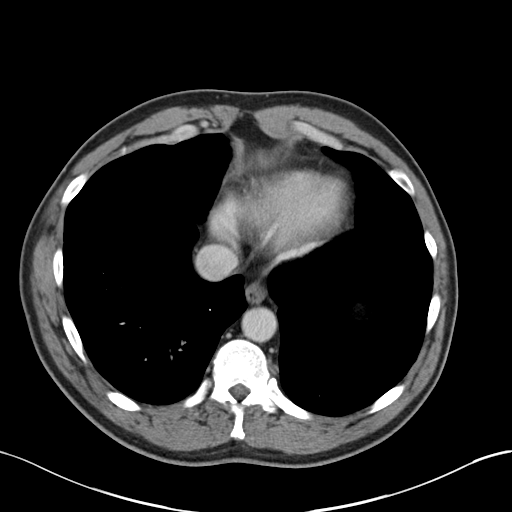
[im 89/95  lung]
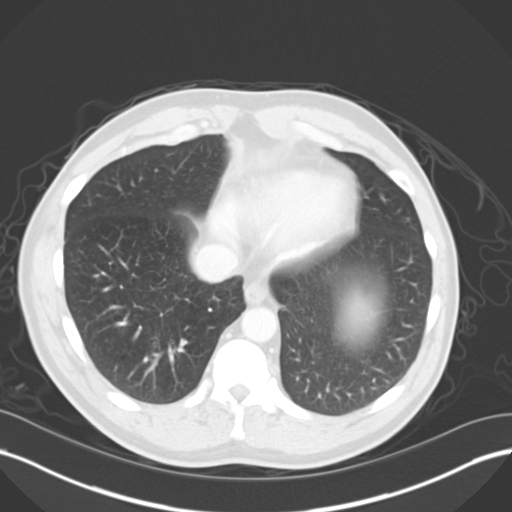

[13 of 32 positions shown; findings below may reference images not displayed]

FINDINGS: Lower chest: Clear lung bases. Normal heart size without pericardial
or pleural effusion.

Hepatobiliary: Moderate hepatic steatosis, with more focal steatosis
adjacent to falciform ligament. Normal gallbladder, without biliary
ductal dilatation.

Pancreas: Normal, without mass or ductal dilatation.

Spleen: Normal

Adrenals/Urinary Tract: Normal adrenal glands. Bilateral too small
to characterize renal lesions. No hydronephrosis. Normal urinary
bladder.

Stomach/Bowel: Partial gastric underdistention. Apparent wall
thickening, at 2.2 cm on image 14 involving the gastric body. Normal
appendix and terminal ileum. Normal small bowel. Portions of the
rectum appear thick-walled, including posteriorly on image 74 and
circumferentially at the rectosigmoid junction on image 69.

Vascular/Lymphatic: Advanced for age aortic and branch vessel
atherosclerosis. Non aneurysmal dilatation of the infrarenal segment
2.4 cm. A left common iliac artery stent is identified with normal
contrast opacification distally. No pelvic adenopathy.

Reproductive: Normal prostate.

Other: No significant free fluid.

Musculoskeletal: Degenerative disc disease at the lumbosacral
junction. Mild disc bulges at L4-5 and L5-S1. Left femoral head
avascular necrosis.
IMPRESSION: 1. No definite explanation for GI bleeding. Areas of apparent wall
thickening within the rectum and rectosigmoid junction are likely
due to retained stool and underdistention respectively. No evidence
of colitis.
2. Moderate hepatic steatosis.
3. Gastric wall thickening which could be at least partially due to
underdistention. Cannot exclude gastritis.
4. Advanced atherosclerosis.
5. Left femoral head avascular necrosis.

## 2016-02-24 ENCOUNTER — Ambulatory Visit: Payer: Self-pay | Admitting: Internal Medicine

## 2016-02-24 DIAGNOSIS — K219 Gastro-esophageal reflux disease without esophagitis: Secondary | ICD-10-CM | POA: Diagnosis not present

## 2016-02-24 DIAGNOSIS — B1910 Unspecified viral hepatitis B without hepatic coma: Secondary | ICD-10-CM | POA: Diagnosis not present

## 2016-02-24 DIAGNOSIS — Z483 Aftercare following surgery for neoplasm: Secondary | ICD-10-CM | POA: Diagnosis not present

## 2016-02-24 DIAGNOSIS — E119 Type 2 diabetes mellitus without complications: Secondary | ICD-10-CM | POA: Diagnosis not present

## 2016-02-24 DIAGNOSIS — J45909 Unspecified asthma, uncomplicated: Secondary | ICD-10-CM | POA: Diagnosis not present

## 2016-02-24 DIAGNOSIS — Z720 Tobacco use: Secondary | ICD-10-CM | POA: Diagnosis not present

## 2016-02-24 DIAGNOSIS — C3411 Malignant neoplasm of upper lobe, right bronchus or lung: Secondary | ICD-10-CM | POA: Diagnosis not present

## 2016-02-24 DIAGNOSIS — Z4682 Encounter for fitting and adjustment of non-vascular catheter: Secondary | ICD-10-CM | POA: Diagnosis not present

## 2016-02-24 DIAGNOSIS — F1010 Alcohol abuse, uncomplicated: Secondary | ICD-10-CM | POA: Diagnosis not present

## 2016-02-24 DIAGNOSIS — E785 Hyperlipidemia, unspecified: Secondary | ICD-10-CM | POA: Diagnosis not present

## 2016-02-24 DIAGNOSIS — F209 Schizophrenia, unspecified: Secondary | ICD-10-CM | POA: Diagnosis not present

## 2016-02-24 DIAGNOSIS — I739 Peripheral vascular disease, unspecified: Secondary | ICD-10-CM | POA: Diagnosis not present

## 2016-02-29 DIAGNOSIS — Z720 Tobacco use: Secondary | ICD-10-CM | POA: Diagnosis not present

## 2016-02-29 DIAGNOSIS — Z483 Aftercare following surgery for neoplasm: Secondary | ICD-10-CM | POA: Diagnosis not present

## 2016-02-29 DIAGNOSIS — I739 Peripheral vascular disease, unspecified: Secondary | ICD-10-CM | POA: Diagnosis not present

## 2016-02-29 DIAGNOSIS — E785 Hyperlipidemia, unspecified: Secondary | ICD-10-CM | POA: Diagnosis not present

## 2016-02-29 DIAGNOSIS — C3411 Malignant neoplasm of upper lobe, right bronchus or lung: Secondary | ICD-10-CM | POA: Diagnosis not present

## 2016-02-29 DIAGNOSIS — J45909 Unspecified asthma, uncomplicated: Secondary | ICD-10-CM | POA: Diagnosis not present

## 2016-02-29 DIAGNOSIS — E119 Type 2 diabetes mellitus without complications: Secondary | ICD-10-CM | POA: Diagnosis not present

## 2016-02-29 DIAGNOSIS — K219 Gastro-esophageal reflux disease without esophagitis: Secondary | ICD-10-CM | POA: Diagnosis not present

## 2016-02-29 DIAGNOSIS — F1010 Alcohol abuse, uncomplicated: Secondary | ICD-10-CM | POA: Diagnosis not present

## 2016-02-29 DIAGNOSIS — F209 Schizophrenia, unspecified: Secondary | ICD-10-CM | POA: Diagnosis not present

## 2016-02-29 DIAGNOSIS — Z4682 Encounter for fitting and adjustment of non-vascular catheter: Secondary | ICD-10-CM | POA: Diagnosis not present

## 2016-02-29 DIAGNOSIS — B1910 Unspecified viral hepatitis B without hepatic coma: Secondary | ICD-10-CM | POA: Diagnosis not present

## 2016-03-01 ENCOUNTER — Ambulatory Visit: Payer: Self-pay | Admitting: Internal Medicine

## 2016-03-01 DIAGNOSIS — F209 Schizophrenia, unspecified: Secondary | ICD-10-CM | POA: Diagnosis not present

## 2016-03-01 DIAGNOSIS — I739 Peripheral vascular disease, unspecified: Secondary | ICD-10-CM | POA: Diagnosis not present

## 2016-03-01 DIAGNOSIS — C3411 Malignant neoplasm of upper lobe, right bronchus or lung: Secondary | ICD-10-CM | POA: Diagnosis not present

## 2016-03-01 DIAGNOSIS — K219 Gastro-esophageal reflux disease without esophagitis: Secondary | ICD-10-CM | POA: Diagnosis not present

## 2016-03-01 DIAGNOSIS — J45909 Unspecified asthma, uncomplicated: Secondary | ICD-10-CM | POA: Diagnosis not present

## 2016-03-01 DIAGNOSIS — F1010 Alcohol abuse, uncomplicated: Secondary | ICD-10-CM | POA: Diagnosis not present

## 2016-03-01 DIAGNOSIS — E119 Type 2 diabetes mellitus without complications: Secondary | ICD-10-CM | POA: Diagnosis not present

## 2016-03-01 DIAGNOSIS — Z4682 Encounter for fitting and adjustment of non-vascular catheter: Secondary | ICD-10-CM | POA: Diagnosis not present

## 2016-03-01 DIAGNOSIS — Z483 Aftercare following surgery for neoplasm: Secondary | ICD-10-CM | POA: Diagnosis not present

## 2016-03-01 DIAGNOSIS — B1910 Unspecified viral hepatitis B without hepatic coma: Secondary | ICD-10-CM | POA: Diagnosis not present

## 2016-03-01 DIAGNOSIS — Z720 Tobacco use: Secondary | ICD-10-CM | POA: Diagnosis not present

## 2016-03-01 DIAGNOSIS — E785 Hyperlipidemia, unspecified: Secondary | ICD-10-CM | POA: Diagnosis not present

## 2016-03-02 DIAGNOSIS — F209 Schizophrenia, unspecified: Secondary | ICD-10-CM | POA: Diagnosis not present

## 2016-03-02 DIAGNOSIS — C3411 Malignant neoplasm of upper lobe, right bronchus or lung: Secondary | ICD-10-CM | POA: Diagnosis not present

## 2016-03-02 DIAGNOSIS — E119 Type 2 diabetes mellitus without complications: Secondary | ICD-10-CM | POA: Diagnosis not present

## 2016-03-02 DIAGNOSIS — I739 Peripheral vascular disease, unspecified: Secondary | ICD-10-CM | POA: Diagnosis not present

## 2016-03-02 DIAGNOSIS — Z4682 Encounter for fitting and adjustment of non-vascular catheter: Secondary | ICD-10-CM | POA: Diagnosis not present

## 2016-03-02 DIAGNOSIS — Z483 Aftercare following surgery for neoplasm: Secondary | ICD-10-CM | POA: Diagnosis not present

## 2016-03-02 DIAGNOSIS — K219 Gastro-esophageal reflux disease without esophagitis: Secondary | ICD-10-CM | POA: Diagnosis not present

## 2016-03-02 DIAGNOSIS — J45909 Unspecified asthma, uncomplicated: Secondary | ICD-10-CM | POA: Diagnosis not present

## 2016-03-02 DIAGNOSIS — F1010 Alcohol abuse, uncomplicated: Secondary | ICD-10-CM | POA: Diagnosis not present

## 2016-03-02 DIAGNOSIS — Z720 Tobacco use: Secondary | ICD-10-CM | POA: Diagnosis not present

## 2016-03-02 DIAGNOSIS — B1910 Unspecified viral hepatitis B without hepatic coma: Secondary | ICD-10-CM | POA: Diagnosis not present

## 2016-03-02 DIAGNOSIS — E785 Hyperlipidemia, unspecified: Secondary | ICD-10-CM | POA: Diagnosis not present

## 2016-03-03 ENCOUNTER — Other Ambulatory Visit (INDEPENDENT_AMBULATORY_CARE_PROVIDER_SITE_OTHER): Payer: Medicare Other

## 2016-03-03 ENCOUNTER — Encounter: Payer: Self-pay | Admitting: Internal Medicine

## 2016-03-03 ENCOUNTER — Ambulatory Visit (INDEPENDENT_AMBULATORY_CARE_PROVIDER_SITE_OTHER): Payer: Medicare Other | Admitting: Internal Medicine

## 2016-03-03 VITALS — BP 124/82 | HR 83 | Temp 98.1°F | Resp 16 | Ht 75.0 in | Wt 197.0 lb

## 2016-03-03 DIAGNOSIS — I10 Essential (primary) hypertension: Secondary | ICD-10-CM | POA: Diagnosis not present

## 2016-03-03 DIAGNOSIS — Z7251 High risk heterosexual behavior: Secondary | ICD-10-CM

## 2016-03-03 DIAGNOSIS — R739 Hyperglycemia, unspecified: Secondary | ICD-10-CM

## 2016-03-03 DIAGNOSIS — D696 Thrombocytopenia, unspecified: Secondary | ICD-10-CM

## 2016-03-03 DIAGNOSIS — J449 Chronic obstructive pulmonary disease, unspecified: Secondary | ICD-10-CM

## 2016-03-03 DIAGNOSIS — K279 Peptic ulcer, site unspecified, unspecified as acute or chronic, without hemorrhage or perforation: Secondary | ICD-10-CM

## 2016-03-03 DIAGNOSIS — M545 Low back pain: Secondary | ICD-10-CM

## 2016-03-03 DIAGNOSIS — M170 Bilateral primary osteoarthritis of knee: Secondary | ICD-10-CM | POA: Diagnosis not present

## 2016-03-03 DIAGNOSIS — J301 Allergic rhinitis due to pollen: Secondary | ICD-10-CM

## 2016-03-03 DIAGNOSIS — G8929 Other chronic pain: Secondary | ICD-10-CM

## 2016-03-03 DIAGNOSIS — A530 Latent syphilis, unspecified as early or late: Secondary | ICD-10-CM

## 2016-03-03 LAB — COMPREHENSIVE METABOLIC PANEL
ALT: 34 U/L (ref 0–53)
AST: 32 U/L (ref 0–37)
Albumin: 4.3 g/dL (ref 3.5–5.2)
Alkaline Phosphatase: 77 U/L (ref 39–117)
BUN: 5 mg/dL — ABNORMAL LOW (ref 6–23)
CALCIUM: 9.3 mg/dL (ref 8.4–10.5)
CHLORIDE: 107 meq/L (ref 96–112)
CO2: 25 meq/L (ref 19–32)
Creatinine, Ser: 0.7 mg/dL (ref 0.40–1.50)
GFR: 149.98 mL/min (ref >60.00)
Glucose, Bld: 107 mg/dL — ABNORMAL HIGH (ref 70–99)
Potassium: 4.3 mEq/L (ref 3.5–5.1)
Sodium: 140 mEq/L (ref 135–145)
Total Bilirubin: 0.4 mg/dL (ref 0.2–1.2)
Total Protein: 7.9 g/dL (ref 6.0–8.3)

## 2016-03-03 LAB — URINALYSIS, ROUTINE W REFLEX MICROSCOPIC
BILIRUBIN URINE: NEGATIVE
Hgb urine dipstick: NEGATIVE
KETONES UR: NEGATIVE
Leukocytes, UA: NEGATIVE
Nitrite: NEGATIVE
PH: 5.5 (ref 5.0–8.0)
RBC / HPF: NONE SEEN (ref 0–?)
Specific Gravity, Urine: <=1.005 — AB (ref 1.000–1.030)
TOTAL PROTEIN, URINE-UPE24: NEGATIVE
URINE GLUCOSE: NEGATIVE
UROBILINOGEN UA: 0.2 (ref 0.0–1.0)
WBC UA: NONE SEEN (ref 0–?)

## 2016-03-03 LAB — CBC WITH DIFFERENTIAL/PLATELET
BASOS ABS: 0 10*3/uL (ref 0.0–0.1)
Basophils Relative: 0.6 % (ref 0.0–3.0)
EOS PCT: 1.8 % (ref 0.0–5.0)
Eosinophils Absolute: 0.1 10*3/uL (ref 0.0–0.7)
HEMATOCRIT: 44.1 % (ref 39.0–52.0)
HEMOGLOBIN: 14.4 g/dL (ref 13.0–17.0)
LYMPHS ABS: 2.1 10*3/uL (ref 0.7–4.0)
LYMPHS PCT: 30.5 % (ref 12.0–46.0)
MCHC: 32.5 g/dL (ref 30.0–36.0)
MCV: 78.1 fl (ref 78.0–100.0)
MONOS PCT: 9.1 % (ref 3.0–12.0)
Monocytes Absolute: 0.6 10*3/uL (ref 0.1–1.0)
NEUTROS PCT: 58 % (ref 43.0–77.0)
Neutro Abs: 4.1 10*3/uL (ref 1.4–7.7)
Platelets: 170 10*3/uL (ref 150.0–400.0)
RBC: 5.64 Mil/uL (ref 4.22–5.81)
RDW: 19.3 % — ABNORMAL HIGH (ref 11.5–15.5)
WBC: 7 10*3/uL (ref 4.0–10.5)

## 2016-03-03 LAB — HEMOGLOBIN A1C: HEMOGLOBIN A1C: 6.2 % (ref 4.6–6.5)

## 2016-03-03 MED ORDER — HYDROCODONE-ACETAMINOPHEN 10-325 MG PO TABS: 1.0000 | ORAL_TABLET | Freq: Three times a day (TID) | ORAL | Status: DC | PRN

## 2016-03-03 MED ORDER — CETIRIZINE HCL 10 MG PO TABS: 10.0000 mg | ORAL_TABLET | Freq: Every day | ORAL | Status: DC

## 2016-03-03 MED ORDER — ESOMEPRAZOLE MAGNESIUM 40 MG PO CPDR: 40.0000 mg | DELAYED_RELEASE_CAPSULE | Freq: Every morning | ORAL | Status: DC

## 2016-03-03 NOTE — Progress Notes (Signed)
Pre visit review using our clinic review tool, if applicable. No additional management support is needed unless otherwise documented below in the visit note. 
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
## 2016-03-03 NOTE — Patient Instructions (Signed)
Hypertension Hypertension, commonly called high blood pressure, is when the force of blood pumping through your arteries is too strong. Your arteries are the blood vessels that carry blood from your heart throughout your body. A blood pressure reading consists of a higher number over a lower number, such as 110/72. The higher number (systolic) is the pressure inside your arteries when your heart pumps. The lower number (diastolic) is the pressure inside your arteries when your heart relaxes. Ideally you want your blood pressure below 120/80. Hypertension forces your heart to work harder to pump blood. Your arteries may become narrow or stiff. Having untreated or uncontrolled hypertension can cause heart attack, stroke, kidney disease, and other problems. RISK FACTORS Some risk factors for high blood pressure are controllable. Others are not.  Risk factors you cannot control include:   Race. You may be at higher risk if you are African American.  Age. Risk increases with age.  Gender. Men are at higher risk than women before age 45 years. After age 65, women are at higher risk than men. Risk factors you can control include:  Not getting enough exercise or physical activity.  Being overweight.  Getting too much fat, sugar, calories, or salt in your diet.  Drinking too much alcohol. SIGNS AND SYMPTOMS Hypertension does not usually cause signs or symptoms. Extremely high blood pressure (hypertensive crisis) may cause headache, anxiety, shortness of breath, and nosebleed. DIAGNOSIS To check if you have hypertension, your health care provider will measure your blood pressure while you are seated, with your arm held at the level of your heart. It should be measured at least twice using the same arm. Certain conditions can cause a difference in blood pressure between your right and left arms. A blood pressure reading that is higher than normal on one occasion does not mean that you need treatment. If  it is not clear whether you have high blood pressure, you may be asked to return on a different day to have your blood pressure checked again. Or, you may be asked to monitor your blood pressure at home for 1 or more weeks. TREATMENT Treating high blood pressure includes making lifestyle changes and possibly taking medicine. Living a healthy lifestyle can help lower high blood pressure. You may need to change some of your habits. Lifestyle changes may include:  Following the DASH diet. This diet is high in fruits, vegetables, and whole grains. It is low in salt, red meat, and added sugars.  Keep your sodium intake below 2,300 mg per day.  Getting at least 30-45 minutes of aerobic exercise at least 4 times per week.  Losing weight if necessary.  Not smoking.  Limiting alcoholic beverages.  Learning ways to reduce stress. Your health care provider may prescribe medicine if lifestyle changes are not enough to get your blood pressure under control, and if one of the following is true:  You are 18-59 years of age and your systolic blood pressure is above 140.  You are 60 years of age or older, and your systolic blood pressure is above 150.  Your diastolic blood pressure is above 90.  You have diabetes, and your systolic blood pressure is over 140 or your diastolic blood pressure is over 90.  You have kidney disease and your blood pressure is above 140/90.  You have heart disease and your blood pressure is above 140/90. Your personal target blood pressure may vary depending on your medical conditions, your age, and other factors. HOME CARE INSTRUCTIONS    Have your blood pressure rechecked as directed by your health care provider.   Take medicines only as directed by your health care provider. Follow the directions carefully. Blood pressure medicines must be taken as prescribed. The medicine does not work as well when you skip doses. Skipping doses also puts you at risk for  problems.  Do not smoke.   Monitor your blood pressure at home as directed by your health care provider. SEEK MEDICAL CARE IF:   You think you are having a reaction to medicines taken.  You have recurrent headaches or feel dizzy.  You have swelling in your ankles.  You have trouble with your vision. SEEK IMMEDIATE MEDICAL CARE IF:  You develop a severe headache or confusion.  You have unusual weakness, numbness, or feel faint.  You have severe chest or abdominal pain.  You vomit repeatedly.  You have trouble breathing. MAKE SURE YOU:   Understand these instructions.  Will watch your condition.  Will get help right away if you are not doing well or get worse.   This information is not intended to replace advice given to you by your health care provider. Make sure you discuss any questions you have with your health care provider.   Document Released: 09/12/2005 Document Revised: 01/27/2015 Document Reviewed: 07/05/2013 Elsevier Interactive Patient Education 2016 [employer]  

## 2016-03-04 DIAGNOSIS — I739 Peripheral vascular disease, unspecified: Secondary | ICD-10-CM | POA: Diagnosis not present

## 2016-03-04 DIAGNOSIS — K219 Gastro-esophageal reflux disease without esophagitis: Secondary | ICD-10-CM | POA: Diagnosis not present

## 2016-03-04 DIAGNOSIS — E119 Type 2 diabetes mellitus without complications: Secondary | ICD-10-CM | POA: Diagnosis not present

## 2016-03-04 DIAGNOSIS — J45909 Unspecified asthma, uncomplicated: Secondary | ICD-10-CM | POA: Diagnosis not present

## 2016-03-04 DIAGNOSIS — C3411 Malignant neoplasm of upper lobe, right bronchus or lung: Secondary | ICD-10-CM | POA: Diagnosis not present

## 2016-03-04 DIAGNOSIS — F209 Schizophrenia, unspecified: Secondary | ICD-10-CM | POA: Diagnosis not present

## 2016-03-04 DIAGNOSIS — F1010 Alcohol abuse, uncomplicated: Secondary | ICD-10-CM | POA: Diagnosis not present

## 2016-03-04 DIAGNOSIS — E785 Hyperlipidemia, unspecified: Secondary | ICD-10-CM | POA: Diagnosis not present

## 2016-03-04 DIAGNOSIS — B1910 Unspecified viral hepatitis B without hepatic coma: Secondary | ICD-10-CM | POA: Diagnosis not present

## 2016-03-04 DIAGNOSIS — Z4682 Encounter for fitting and adjustment of non-vascular catheter: Secondary | ICD-10-CM | POA: Diagnosis not present

## 2016-03-04 DIAGNOSIS — Z483 Aftercare following surgery for neoplasm: Secondary | ICD-10-CM | POA: Diagnosis not present

## 2016-03-04 DIAGNOSIS — Z720 Tobacco use: Secondary | ICD-10-CM | POA: Diagnosis not present

## 2016-03-04 LAB — HEPATITIS B SURFACE ANTIGEN: HEP B S AG: NEGATIVE

## 2016-03-04 LAB — RPR: RPR Ser Ql: REACTIVE — AB

## 2016-03-04 LAB — FLUORESCENT TREPONEMAL AB(FTA)-IGG-BLD: FLUORESCENT TREPONEMAL ABS: REACTIVE — AB

## 2016-03-04 LAB — RPR TITER

## 2016-03-04 LAB — HIV ANTIBODY (ROUTINE TESTING W REFLEX): HIV 1&2 Ab, 4th Generation: NONREACTIVE

## 2016-03-05 ENCOUNTER — Encounter: Payer: Self-pay | Admitting: Internal Medicine

## 2016-03-05 DIAGNOSIS — A530 Latent syphilis, unspecified as early or late: Secondary | ICD-10-CM | POA: Insufficient documentation

## 2016-03-07 DIAGNOSIS — Z4682 Encounter for fitting and adjustment of non-vascular catheter: Secondary | ICD-10-CM | POA: Diagnosis not present

## 2016-03-07 DIAGNOSIS — J45909 Unspecified asthma, uncomplicated: Secondary | ICD-10-CM | POA: Diagnosis not present

## 2016-03-07 DIAGNOSIS — F1010 Alcohol abuse, uncomplicated: Secondary | ICD-10-CM | POA: Diagnosis not present

## 2016-03-07 DIAGNOSIS — F209 Schizophrenia, unspecified: Secondary | ICD-10-CM | POA: Diagnosis not present

## 2016-03-07 DIAGNOSIS — C3411 Malignant neoplasm of upper lobe, right bronchus or lung: Secondary | ICD-10-CM | POA: Diagnosis not present

## 2016-03-07 DIAGNOSIS — I739 Peripheral vascular disease, unspecified: Secondary | ICD-10-CM | POA: Diagnosis not present

## 2016-03-07 DIAGNOSIS — Z483 Aftercare following surgery for neoplasm: Secondary | ICD-10-CM | POA: Diagnosis not present

## 2016-03-07 DIAGNOSIS — K219 Gastro-esophageal reflux disease without esophagitis: Secondary | ICD-10-CM | POA: Diagnosis not present

## 2016-03-07 DIAGNOSIS — Z720 Tobacco use: Secondary | ICD-10-CM | POA: Diagnosis not present

## 2016-03-07 DIAGNOSIS — E785 Hyperlipidemia, unspecified: Secondary | ICD-10-CM | POA: Diagnosis not present

## 2016-03-07 DIAGNOSIS — E119 Type 2 diabetes mellitus without complications: Secondary | ICD-10-CM | POA: Diagnosis not present

## 2016-03-07 DIAGNOSIS — B1910 Unspecified viral hepatitis B without hepatic coma: Secondary | ICD-10-CM | POA: Diagnosis not present

## 2016-03-09 ENCOUNTER — Telehealth: Payer: Self-pay | Admitting: Internal Medicine

## 2016-03-09 DIAGNOSIS — K219 Gastro-esophageal reflux disease without esophagitis: Secondary | ICD-10-CM | POA: Diagnosis not present

## 2016-03-09 DIAGNOSIS — E785 Hyperlipidemia, unspecified: Secondary | ICD-10-CM | POA: Diagnosis not present

## 2016-03-09 DIAGNOSIS — I739 Peripheral vascular disease, unspecified: Secondary | ICD-10-CM | POA: Diagnosis not present

## 2016-03-09 DIAGNOSIS — F1010 Alcohol abuse, uncomplicated: Secondary | ICD-10-CM | POA: Diagnosis not present

## 2016-03-09 DIAGNOSIS — Z483 Aftercare following surgery for neoplasm: Secondary | ICD-10-CM | POA: Diagnosis not present

## 2016-03-09 DIAGNOSIS — Z4682 Encounter for fitting and adjustment of non-vascular catheter: Secondary | ICD-10-CM | POA: Diagnosis not present

## 2016-03-09 DIAGNOSIS — Z720 Tobacco use: Secondary | ICD-10-CM | POA: Diagnosis not present

## 2016-03-09 DIAGNOSIS — F209 Schizophrenia, unspecified: Secondary | ICD-10-CM | POA: Diagnosis not present

## 2016-03-09 DIAGNOSIS — C3411 Malignant neoplasm of upper lobe, right bronchus or lung: Secondary | ICD-10-CM | POA: Diagnosis not present

## 2016-03-09 DIAGNOSIS — E119 Type 2 diabetes mellitus without complications: Secondary | ICD-10-CM | POA: Diagnosis not present

## 2016-03-09 DIAGNOSIS — J45909 Unspecified asthma, uncomplicated: Secondary | ICD-10-CM | POA: Diagnosis not present

## 2016-03-09 DIAGNOSIS — B1910 Unspecified viral hepatitis B without hepatic coma: Secondary | ICD-10-CM | POA: Diagnosis not present

## 2016-03-09 NOTE — Telephone Encounter (Signed)
Requesting verbal for recertification for skilled nursing

## 2016-03-09 NOTE — Telephone Encounter (Signed)
Notified Michelle w/MD response.Marland KitchenJohny Chess

## 2016-03-09 NOTE — Telephone Encounter (Signed)
yes

## 2016-03-11 DIAGNOSIS — F209 Schizophrenia, unspecified: Secondary | ICD-10-CM | POA: Diagnosis not present

## 2016-03-11 DIAGNOSIS — F1010 Alcohol abuse, uncomplicated: Secondary | ICD-10-CM | POA: Diagnosis not present

## 2016-03-11 DIAGNOSIS — J45909 Unspecified asthma, uncomplicated: Secondary | ICD-10-CM | POA: Diagnosis not present

## 2016-03-11 DIAGNOSIS — I739 Peripheral vascular disease, unspecified: Secondary | ICD-10-CM | POA: Diagnosis not present

## 2016-03-11 DIAGNOSIS — E785 Hyperlipidemia, unspecified: Secondary | ICD-10-CM | POA: Diagnosis not present

## 2016-03-11 DIAGNOSIS — Z483 Aftercare following surgery for neoplasm: Secondary | ICD-10-CM | POA: Diagnosis not present

## 2016-03-11 DIAGNOSIS — Z4682 Encounter for fitting and adjustment of non-vascular catheter: Secondary | ICD-10-CM | POA: Diagnosis not present

## 2016-03-11 DIAGNOSIS — C3411 Malignant neoplasm of upper lobe, right bronchus or lung: Secondary | ICD-10-CM | POA: Diagnosis not present

## 2016-03-11 DIAGNOSIS — Z720 Tobacco use: Secondary | ICD-10-CM | POA: Diagnosis not present

## 2016-03-11 DIAGNOSIS — B1910 Unspecified viral hepatitis B without hepatic coma: Secondary | ICD-10-CM | POA: Diagnosis not present

## 2016-03-11 DIAGNOSIS — K219 Gastro-esophageal reflux disease without esophagitis: Secondary | ICD-10-CM | POA: Diagnosis not present

## 2016-03-11 DIAGNOSIS — E119 Type 2 diabetes mellitus without complications: Secondary | ICD-10-CM | POA: Diagnosis not present

## 2016-03-16 DIAGNOSIS — K219 Gastro-esophageal reflux disease without esophagitis: Secondary | ICD-10-CM | POA: Diagnosis not present

## 2016-03-16 DIAGNOSIS — Z4682 Encounter for fitting and adjustment of non-vascular catheter: Secondary | ICD-10-CM | POA: Diagnosis not present

## 2016-03-16 DIAGNOSIS — Z483 Aftercare following surgery for neoplasm: Secondary | ICD-10-CM | POA: Diagnosis not present

## 2016-03-16 DIAGNOSIS — C3411 Malignant neoplasm of upper lobe, right bronchus or lung: Secondary | ICD-10-CM | POA: Diagnosis not present

## 2016-03-16 DIAGNOSIS — I739 Peripheral vascular disease, unspecified: Secondary | ICD-10-CM | POA: Diagnosis not present

## 2016-03-16 DIAGNOSIS — F1010 Alcohol abuse, uncomplicated: Secondary | ICD-10-CM | POA: Diagnosis not present

## 2016-03-16 DIAGNOSIS — B1910 Unspecified viral hepatitis B without hepatic coma: Secondary | ICD-10-CM | POA: Diagnosis not present

## 2016-03-16 DIAGNOSIS — F209 Schizophrenia, unspecified: Secondary | ICD-10-CM | POA: Diagnosis not present

## 2016-03-16 DIAGNOSIS — E785 Hyperlipidemia, unspecified: Secondary | ICD-10-CM | POA: Diagnosis not present

## 2016-03-16 DIAGNOSIS — E119 Type 2 diabetes mellitus without complications: Secondary | ICD-10-CM | POA: Diagnosis not present

## 2016-03-16 DIAGNOSIS — Z720 Tobacco use: Secondary | ICD-10-CM | POA: Diagnosis not present

## 2016-03-16 DIAGNOSIS — J45909 Unspecified asthma, uncomplicated: Secondary | ICD-10-CM | POA: Diagnosis not present

## 2016-03-17 ENCOUNTER — Telehealth: Payer: Self-pay | Admitting: Internal Medicine

## 2016-03-17 DIAGNOSIS — J449 Chronic obstructive pulmonary disease, unspecified: Secondary | ICD-10-CM

## 2016-03-17 DIAGNOSIS — C3492 Malignant neoplasm of unspecified part of left bronchus or lung: Secondary | ICD-10-CM

## 2016-03-17 DIAGNOSIS — F17210 Nicotine dependence, cigarettes, uncomplicated: Secondary | ICD-10-CM

## 2016-03-17 MED ORDER — VARENICLINE TARTRATE 0.5 MG X 11 & 1 MG X 42 PO MISC: ORAL | Status: DC

## 2016-03-17 NOTE — Telephone Encounter (Signed)
Patient is requesting a starter kit for chantix.  °

## 2016-03-17 NOTE — Telephone Encounter (Signed)
The prescription will need to be faxed

## 2016-03-18 DIAGNOSIS — E119 Type 2 diabetes mellitus without complications: Secondary | ICD-10-CM | POA: Diagnosis not present

## 2016-03-18 DIAGNOSIS — E785 Hyperlipidemia, unspecified: Secondary | ICD-10-CM | POA: Diagnosis not present

## 2016-03-18 DIAGNOSIS — F1010 Alcohol abuse, uncomplicated: Secondary | ICD-10-CM | POA: Diagnosis not present

## 2016-03-18 DIAGNOSIS — F209 Schizophrenia, unspecified: Secondary | ICD-10-CM | POA: Diagnosis not present

## 2016-03-18 DIAGNOSIS — Z4682 Encounter for fitting and adjustment of non-vascular catheter: Secondary | ICD-10-CM | POA: Diagnosis not present

## 2016-03-18 DIAGNOSIS — I739 Peripheral vascular disease, unspecified: Secondary | ICD-10-CM | POA: Diagnosis not present

## 2016-03-18 DIAGNOSIS — J45909 Unspecified asthma, uncomplicated: Secondary | ICD-10-CM | POA: Diagnosis not present

## 2016-03-18 DIAGNOSIS — C3411 Malignant neoplasm of upper lobe, right bronchus or lung: Secondary | ICD-10-CM | POA: Diagnosis not present

## 2016-03-18 DIAGNOSIS — Z483 Aftercare following surgery for neoplasm: Secondary | ICD-10-CM | POA: Diagnosis not present

## 2016-03-18 DIAGNOSIS — Z720 Tobacco use: Secondary | ICD-10-CM | POA: Diagnosis not present

## 2016-03-18 DIAGNOSIS — K219 Gastro-esophageal reflux disease without esophagitis: Secondary | ICD-10-CM | POA: Diagnosis not present

## 2016-03-18 DIAGNOSIS — B1910 Unspecified viral hepatitis B without hepatic coma: Secondary | ICD-10-CM | POA: Diagnosis not present

## 2016-03-18 NOTE — Telephone Encounter (Signed)
Notified pt rx fax to pharmacy...[name]

## 2016-03-21 DIAGNOSIS — F209 Schizophrenia, unspecified: Secondary | ICD-10-CM | POA: Diagnosis not present

## 2016-03-21 DIAGNOSIS — E119 Type 2 diabetes mellitus without complications: Secondary | ICD-10-CM | POA: Diagnosis not present

## 2016-03-21 DIAGNOSIS — E785 Hyperlipidemia, unspecified: Secondary | ICD-10-CM | POA: Diagnosis not present

## 2016-03-21 DIAGNOSIS — F1010 Alcohol abuse, uncomplicated: Secondary | ICD-10-CM | POA: Diagnosis not present

## 2016-03-21 DIAGNOSIS — Z4682 Encounter for fitting and adjustment of non-vascular catheter: Secondary | ICD-10-CM | POA: Diagnosis not present

## 2016-03-21 DIAGNOSIS — Z483 Aftercare following surgery for neoplasm: Secondary | ICD-10-CM | POA: Diagnosis not present

## 2016-03-21 DIAGNOSIS — Z720 Tobacco use: Secondary | ICD-10-CM | POA: Diagnosis not present

## 2016-03-21 DIAGNOSIS — B1910 Unspecified viral hepatitis B without hepatic coma: Secondary | ICD-10-CM | POA: Diagnosis not present

## 2016-03-21 DIAGNOSIS — J45909 Unspecified asthma, uncomplicated: Secondary | ICD-10-CM | POA: Diagnosis not present

## 2016-03-21 DIAGNOSIS — K219 Gastro-esophageal reflux disease without esophagitis: Secondary | ICD-10-CM | POA: Diagnosis not present

## 2016-03-21 DIAGNOSIS — I739 Peripheral vascular disease, unspecified: Secondary | ICD-10-CM | POA: Diagnosis not present

## 2016-03-21 DIAGNOSIS — C3411 Malignant neoplasm of upper lobe, right bronchus or lung: Secondary | ICD-10-CM | POA: Diagnosis not present

## 2016-03-23 DIAGNOSIS — E785 Hyperlipidemia, unspecified: Secondary | ICD-10-CM | POA: Diagnosis not present

## 2016-03-23 DIAGNOSIS — E119 Type 2 diabetes mellitus without complications: Secondary | ICD-10-CM | POA: Diagnosis not present

## 2016-03-23 DIAGNOSIS — B1910 Unspecified viral hepatitis B without hepatic coma: Secondary | ICD-10-CM | POA: Diagnosis not present

## 2016-03-23 DIAGNOSIS — F209 Schizophrenia, unspecified: Secondary | ICD-10-CM | POA: Diagnosis not present

## 2016-03-23 DIAGNOSIS — Z483 Aftercare following surgery for neoplasm: Secondary | ICD-10-CM | POA: Diagnosis not present

## 2016-03-23 DIAGNOSIS — J45909 Unspecified asthma, uncomplicated: Secondary | ICD-10-CM | POA: Diagnosis not present

## 2016-03-23 DIAGNOSIS — K219 Gastro-esophageal reflux disease without esophagitis: Secondary | ICD-10-CM | POA: Diagnosis not present

## 2016-03-23 DIAGNOSIS — C3411 Malignant neoplasm of upper lobe, right bronchus or lung: Secondary | ICD-10-CM | POA: Diagnosis not present

## 2016-03-23 DIAGNOSIS — F1010 Alcohol abuse, uncomplicated: Secondary | ICD-10-CM | POA: Diagnosis not present

## 2016-03-23 DIAGNOSIS — Z720 Tobacco use: Secondary | ICD-10-CM | POA: Diagnosis not present

## 2016-03-23 DIAGNOSIS — I739 Peripheral vascular disease, unspecified: Secondary | ICD-10-CM | POA: Diagnosis not present

## 2016-03-23 DIAGNOSIS — Z4682 Encounter for fitting and adjustment of non-vascular catheter: Secondary | ICD-10-CM | POA: Diagnosis not present

## 2016-03-25 DIAGNOSIS — F209 Schizophrenia, unspecified: Secondary | ICD-10-CM | POA: Diagnosis not present

## 2016-03-25 DIAGNOSIS — K219 Gastro-esophageal reflux disease without esophagitis: Secondary | ICD-10-CM | POA: Diagnosis not present

## 2016-03-25 DIAGNOSIS — I739 Peripheral vascular disease, unspecified: Secondary | ICD-10-CM | POA: Diagnosis not present

## 2016-03-25 DIAGNOSIS — C3411 Malignant neoplasm of upper lobe, right bronchus or lung: Secondary | ICD-10-CM | POA: Diagnosis not present

## 2016-03-25 DIAGNOSIS — Z483 Aftercare following surgery for neoplasm: Secondary | ICD-10-CM | POA: Diagnosis not present

## 2016-03-25 DIAGNOSIS — E785 Hyperlipidemia, unspecified: Secondary | ICD-10-CM | POA: Diagnosis not present

## 2016-03-25 DIAGNOSIS — Z720 Tobacco use: Secondary | ICD-10-CM | POA: Diagnosis not present

## 2016-03-25 DIAGNOSIS — B1910 Unspecified viral hepatitis B without hepatic coma: Secondary | ICD-10-CM | POA: Diagnosis not present

## 2016-03-25 DIAGNOSIS — E119 Type 2 diabetes mellitus without complications: Secondary | ICD-10-CM | POA: Diagnosis not present

## 2016-03-25 DIAGNOSIS — Z4682 Encounter for fitting and adjustment of non-vascular catheter: Secondary | ICD-10-CM | POA: Diagnosis not present

## 2016-03-25 DIAGNOSIS — J45909 Unspecified asthma, uncomplicated: Secondary | ICD-10-CM | POA: Diagnosis not present

## 2016-03-25 DIAGNOSIS — F1010 Alcohol abuse, uncomplicated: Secondary | ICD-10-CM | POA: Diagnosis not present

## 2016-03-28 DIAGNOSIS — J45909 Unspecified asthma, uncomplicated: Secondary | ICD-10-CM | POA: Diagnosis not present

## 2016-03-28 DIAGNOSIS — F209 Schizophrenia, unspecified: Secondary | ICD-10-CM | POA: Diagnosis not present

## 2016-03-28 DIAGNOSIS — Z720 Tobacco use: Secondary | ICD-10-CM | POA: Diagnosis not present

## 2016-03-28 DIAGNOSIS — Z4682 Encounter for fitting and adjustment of non-vascular catheter: Secondary | ICD-10-CM | POA: Diagnosis not present

## 2016-03-28 DIAGNOSIS — C3411 Malignant neoplasm of upper lobe, right bronchus or lung: Secondary | ICD-10-CM | POA: Diagnosis not present

## 2016-03-28 DIAGNOSIS — K219 Gastro-esophageal reflux disease without esophagitis: Secondary | ICD-10-CM | POA: Diagnosis not present

## 2016-03-28 DIAGNOSIS — Z483 Aftercare following surgery for neoplasm: Secondary | ICD-10-CM | POA: Diagnosis not present

## 2016-03-28 DIAGNOSIS — E785 Hyperlipidemia, unspecified: Secondary | ICD-10-CM | POA: Diagnosis not present

## 2016-03-28 DIAGNOSIS — I739 Peripheral vascular disease, unspecified: Secondary | ICD-10-CM | POA: Diagnosis not present

## 2016-03-28 DIAGNOSIS — F1010 Alcohol abuse, uncomplicated: Secondary | ICD-10-CM | POA: Diagnosis not present

## 2016-03-28 DIAGNOSIS — E119 Type 2 diabetes mellitus without complications: Secondary | ICD-10-CM | POA: Diagnosis not present

## 2016-03-28 DIAGNOSIS — B1910 Unspecified viral hepatitis B without hepatic coma: Secondary | ICD-10-CM | POA: Diagnosis not present

## 2016-03-31 ENCOUNTER — Telehealth: Payer: Self-pay

## 2016-03-31 DIAGNOSIS — I739 Peripheral vascular disease, unspecified: Secondary | ICD-10-CM | POA: Diagnosis not present

## 2016-03-31 DIAGNOSIS — F1010 Alcohol abuse, uncomplicated: Secondary | ICD-10-CM | POA: Diagnosis not present

## 2016-03-31 DIAGNOSIS — Z4682 Encounter for fitting and adjustment of non-vascular catheter: Secondary | ICD-10-CM | POA: Diagnosis not present

## 2016-03-31 DIAGNOSIS — B1910 Unspecified viral hepatitis B without hepatic coma: Secondary | ICD-10-CM | POA: Diagnosis not present

## 2016-03-31 DIAGNOSIS — E785 Hyperlipidemia, unspecified: Secondary | ICD-10-CM | POA: Diagnosis not present

## 2016-03-31 DIAGNOSIS — C3411 Malignant neoplasm of upper lobe, right bronchus or lung: Secondary | ICD-10-CM | POA: Diagnosis not present

## 2016-03-31 DIAGNOSIS — Z720 Tobacco use: Secondary | ICD-10-CM | POA: Diagnosis not present

## 2016-03-31 DIAGNOSIS — K219 Gastro-esophageal reflux disease without esophagitis: Secondary | ICD-10-CM | POA: Diagnosis not present

## 2016-03-31 DIAGNOSIS — Z483 Aftercare following surgery for neoplasm: Secondary | ICD-10-CM | POA: Diagnosis not present

## 2016-03-31 DIAGNOSIS — F209 Schizophrenia, unspecified: Secondary | ICD-10-CM | POA: Diagnosis not present

## 2016-03-31 DIAGNOSIS — J45909 Unspecified asthma, uncomplicated: Secondary | ICD-10-CM | POA: Diagnosis not present

## 2016-03-31 DIAGNOSIS — E119 Type 2 diabetes mellitus without complications: Secondary | ICD-10-CM | POA: Diagnosis not present

## 2016-03-31 NOTE — Telephone Encounter (Signed)
Home Health Cert/Plan of Care received (03/11/2016-05/09/2016)  and placed on MD's desk for signature

## 2016-04-01 DIAGNOSIS — E119 Type 2 diabetes mellitus without complications: Secondary | ICD-10-CM | POA: Diagnosis not present

## 2016-04-01 DIAGNOSIS — Z4682 Encounter for fitting and adjustment of non-vascular catheter: Secondary | ICD-10-CM | POA: Diagnosis not present

## 2016-04-01 DIAGNOSIS — E785 Hyperlipidemia, unspecified: Secondary | ICD-10-CM | POA: Diagnosis not present

## 2016-04-01 DIAGNOSIS — Z720 Tobacco use: Secondary | ICD-10-CM | POA: Diagnosis not present

## 2016-04-01 DIAGNOSIS — F209 Schizophrenia, unspecified: Secondary | ICD-10-CM | POA: Diagnosis not present

## 2016-04-01 DIAGNOSIS — I739 Peripheral vascular disease, unspecified: Secondary | ICD-10-CM | POA: Diagnosis not present

## 2016-04-01 DIAGNOSIS — J45909 Unspecified asthma, uncomplicated: Secondary | ICD-10-CM | POA: Diagnosis not present

## 2016-04-01 DIAGNOSIS — F1010 Alcohol abuse, uncomplicated: Secondary | ICD-10-CM | POA: Diagnosis not present

## 2016-04-01 DIAGNOSIS — B1910 Unspecified viral hepatitis B without hepatic coma: Secondary | ICD-10-CM | POA: Diagnosis not present

## 2016-04-01 DIAGNOSIS — C3411 Malignant neoplasm of upper lobe, right bronchus or lung: Secondary | ICD-10-CM | POA: Diagnosis not present

## 2016-04-01 DIAGNOSIS — K219 Gastro-esophageal reflux disease without esophagitis: Secondary | ICD-10-CM | POA: Diagnosis not present

## 2016-04-01 DIAGNOSIS — Z483 Aftercare following surgery for neoplasm: Secondary | ICD-10-CM | POA: Diagnosis not present

## 2016-04-04 NOTE — Telephone Encounter (Signed)
Paperwork signed, faxed, copy sent to scan 

## 2016-04-05 DIAGNOSIS — B1910 Unspecified viral hepatitis B without hepatic coma: Secondary | ICD-10-CM | POA: Diagnosis not present

## 2016-04-05 DIAGNOSIS — Z483 Aftercare following surgery for neoplasm: Secondary | ICD-10-CM | POA: Diagnosis not present

## 2016-04-05 DIAGNOSIS — J45909 Unspecified asthma, uncomplicated: Secondary | ICD-10-CM | POA: Diagnosis not present

## 2016-04-05 DIAGNOSIS — K219 Gastro-esophageal reflux disease without esophagitis: Secondary | ICD-10-CM | POA: Diagnosis not present

## 2016-04-05 DIAGNOSIS — F1010 Alcohol abuse, uncomplicated: Secondary | ICD-10-CM | POA: Diagnosis not present

## 2016-04-05 DIAGNOSIS — C3411 Malignant neoplasm of upper lobe, right bronchus or lung: Secondary | ICD-10-CM | POA: Diagnosis not present

## 2016-04-05 DIAGNOSIS — F209 Schizophrenia, unspecified: Secondary | ICD-10-CM | POA: Diagnosis not present

## 2016-04-05 DIAGNOSIS — Z720 Tobacco use: Secondary | ICD-10-CM | POA: Diagnosis not present

## 2016-04-05 DIAGNOSIS — I739 Peripheral vascular disease, unspecified: Secondary | ICD-10-CM | POA: Diagnosis not present

## 2016-04-05 DIAGNOSIS — E785 Hyperlipidemia, unspecified: Secondary | ICD-10-CM | POA: Diagnosis not present

## 2016-04-05 DIAGNOSIS — Z4682 Encounter for fitting and adjustment of non-vascular catheter: Secondary | ICD-10-CM | POA: Diagnosis not present

## 2016-04-05 DIAGNOSIS — E119 Type 2 diabetes mellitus without complications: Secondary | ICD-10-CM | POA: Diagnosis not present

## 2016-04-06 ENCOUNTER — Encounter: Payer: Self-pay | Admitting: Internal Medicine

## 2016-04-07 ENCOUNTER — Encounter: Payer: Self-pay | Admitting: Internal Medicine

## 2016-04-07 ENCOUNTER — Telehealth: Payer: Self-pay

## 2016-04-07 DIAGNOSIS — E785 Hyperlipidemia, unspecified: Secondary | ICD-10-CM | POA: Diagnosis not present

## 2016-04-07 DIAGNOSIS — Z720 Tobacco use: Secondary | ICD-10-CM | POA: Diagnosis not present

## 2016-04-07 DIAGNOSIS — I739 Peripheral vascular disease, unspecified: Secondary | ICD-10-CM | POA: Diagnosis not present

## 2016-04-07 DIAGNOSIS — F1010 Alcohol abuse, uncomplicated: Secondary | ICD-10-CM | POA: Diagnosis not present

## 2016-04-07 DIAGNOSIS — J45909 Unspecified asthma, uncomplicated: Secondary | ICD-10-CM | POA: Diagnosis not present

## 2016-04-07 DIAGNOSIS — F209 Schizophrenia, unspecified: Secondary | ICD-10-CM | POA: Diagnosis not present

## 2016-04-07 DIAGNOSIS — B1910 Unspecified viral hepatitis B without hepatic coma: Secondary | ICD-10-CM | POA: Diagnosis not present

## 2016-04-07 DIAGNOSIS — Z4682 Encounter for fitting and adjustment of non-vascular catheter: Secondary | ICD-10-CM | POA: Diagnosis not present

## 2016-04-07 DIAGNOSIS — E119 Type 2 diabetes mellitus without complications: Secondary | ICD-10-CM | POA: Diagnosis not present

## 2016-04-07 DIAGNOSIS — K219 Gastro-esophageal reflux disease without esophagitis: Secondary | ICD-10-CM | POA: Diagnosis not present

## 2016-04-07 DIAGNOSIS — Z483 Aftercare following surgery for neoplasm: Secondary | ICD-10-CM | POA: Diagnosis not present

## 2016-04-07 DIAGNOSIS — C3411 Malignant neoplasm of upper lobe, right bronchus or lung: Secondary | ICD-10-CM | POA: Diagnosis not present

## 2016-04-07 NOTE — Telephone Encounter (Signed)
What are his symptoms?

## 2016-04-07 NOTE — Telephone Encounter (Signed)
Please advise patient called and stated that he had syphilis last year, he is having signs and symptoms of this again and is unable to see you until next Tuesday. He was wondering if he could come in today to have some lab work done to see if this is coming back

## 2016-04-08 NOTE — Telephone Encounter (Signed)
Patient states that he is not feeling well and does not feel "normal". Would not go into specific as to what the symptoms were.

## 2016-04-11 DIAGNOSIS — C3411 Malignant neoplasm of upper lobe, right bronchus or lung: Secondary | ICD-10-CM | POA: Diagnosis not present

## 2016-04-11 DIAGNOSIS — E785 Hyperlipidemia, unspecified: Secondary | ICD-10-CM | POA: Diagnosis not present

## 2016-04-11 DIAGNOSIS — Z4682 Encounter for fitting and adjustment of non-vascular catheter: Secondary | ICD-10-CM | POA: Diagnosis not present

## 2016-04-11 DIAGNOSIS — Z720 Tobacco use: Secondary | ICD-10-CM | POA: Diagnosis not present

## 2016-04-11 DIAGNOSIS — K219 Gastro-esophageal reflux disease without esophagitis: Secondary | ICD-10-CM | POA: Diagnosis not present

## 2016-04-11 DIAGNOSIS — Z483 Aftercare following surgery for neoplasm: Secondary | ICD-10-CM | POA: Diagnosis not present

## 2016-04-11 DIAGNOSIS — E119 Type 2 diabetes mellitus without complications: Secondary | ICD-10-CM | POA: Diagnosis not present

## 2016-04-11 DIAGNOSIS — I739 Peripheral vascular disease, unspecified: Secondary | ICD-10-CM | POA: Diagnosis not present

## 2016-04-11 DIAGNOSIS — J45909 Unspecified asthma, uncomplicated: Secondary | ICD-10-CM | POA: Diagnosis not present

## 2016-04-12 ENCOUNTER — Ambulatory Visit: Payer: Self-pay | Admitting: Internal Medicine

## 2016-04-13 ENCOUNTER — Other Ambulatory Visit (HOSPITAL_BASED_OUTPATIENT_CLINIC_OR_DEPARTMENT_OTHER): Payer: Medicare Other

## 2016-04-13 ENCOUNTER — Ambulatory Visit (HOSPITAL_COMMUNITY)
Admission: RE | Admit: 2016-04-13 | Discharge: 2016-04-13 | Disposition: A | Discharge status: Home / Self Care | Payer: Medicare Other | Source: Ambulatory Visit | Attending: Internal Medicine | Admitting: Internal Medicine

## 2016-04-13 ENCOUNTER — Encounter (HOSPITAL_COMMUNITY): Payer: Self-pay

## 2016-04-13 DIAGNOSIS — I739 Peripheral vascular disease, unspecified: Secondary | ICD-10-CM | POA: Diagnosis not present

## 2016-04-13 DIAGNOSIS — R590 Localized enlarged lymph nodes: Secondary | ICD-10-CM | POA: Diagnosis not present

## 2016-04-13 DIAGNOSIS — Z720 Tobacco use: Secondary | ICD-10-CM | POA: Diagnosis not present

## 2016-04-13 DIAGNOSIS — C3411 Malignant neoplasm of upper lobe, right bronchus or lung: Secondary | ICD-10-CM | POA: Diagnosis not present

## 2016-04-13 DIAGNOSIS — J45909 Unspecified asthma, uncomplicated: Secondary | ICD-10-CM | POA: Diagnosis not present

## 2016-04-13 DIAGNOSIS — K219 Gastro-esophageal reflux disease without esophagitis: Secondary | ICD-10-CM | POA: Diagnosis not present

## 2016-04-13 DIAGNOSIS — E119 Type 2 diabetes mellitus without complications: Secondary | ICD-10-CM | POA: Diagnosis not present

## 2016-04-13 DIAGNOSIS — E785 Hyperlipidemia, unspecified: Secondary | ICD-10-CM | POA: Diagnosis not present

## 2016-04-13 DIAGNOSIS — I2510 Atherosclerotic heart disease of native coronary artery without angina pectoris: Secondary | ICD-10-CM | POA: Insufficient documentation

## 2016-04-13 DIAGNOSIS — B1910 Unspecified viral hepatitis B without hepatic coma: Secondary | ICD-10-CM | POA: Diagnosis not present

## 2016-04-13 DIAGNOSIS — Z4682 Encounter for fitting and adjustment of non-vascular catheter: Secondary | ICD-10-CM | POA: Diagnosis not present

## 2016-04-13 DIAGNOSIS — F1010 Alcohol abuse, uncomplicated: Secondary | ICD-10-CM | POA: Diagnosis not present

## 2016-04-13 DIAGNOSIS — K760 Fatty (change of) liver, not elsewhere classified: Secondary | ICD-10-CM | POA: Insufficient documentation

## 2016-04-13 DIAGNOSIS — Z902 Acquired absence of lung [part of]: Secondary | ICD-10-CM | POA: Diagnosis not present

## 2016-04-13 DIAGNOSIS — Z483 Aftercare following surgery for neoplasm: Secondary | ICD-10-CM | POA: Diagnosis not present

## 2016-04-13 DIAGNOSIS — F209 Schizophrenia, unspecified: Secondary | ICD-10-CM | POA: Diagnosis not present

## 2016-04-13 DIAGNOSIS — C3491 Malignant neoplasm of unspecified part of right bronchus or lung: Secondary | ICD-10-CM | POA: Diagnosis not present

## 2016-04-13 LAB — CBC WITH DIFFERENTIAL/PLATELET
BASO%: 0.4 % (ref 0.0–2.0)
BASOS ABS: 0 10*3/uL (ref 0.0–0.1)
EOS ABS: 0.1 10*3/uL (ref 0.0–0.5)
EOS%: 1.6 % (ref 0.0–7.0)
HEMATOCRIT: 40.2 % (ref 38.4–49.9)
HEMOGLOBIN: 13.2 g/dL (ref 13.0–17.1)
LYMPH%: 36.2 % (ref 14.0–49.0)
MCH: 26 pg — AB (ref 27.2–33.4)
MCHC: 32.8 g/dL (ref 32.0–36.0)
MCV: 79.1 fL — AB (ref 79.3–98.0)
MONO#: 0.6 10*3/uL (ref 0.1–0.9)
MONO%: 12.7 % (ref 0.0–14.0)
NEUT#: 2.4 10*3/uL (ref 1.5–6.5)
NEUT%: 49.1 % (ref 39.0–75.0)
PLATELETS: 107 10*3/uL — AB (ref 140–400)
RBC: 5.08 10*6/uL (ref 4.20–5.82)
RDW: 18.4 % — ABNORMAL HIGH (ref 11.0–14.6)
WBC: 5 10*3/uL (ref 4.0–10.3)
lymph#: 1.8 10*3/uL (ref 0.9–3.3)
nRBC: 0 % (ref 0–0)

## 2016-04-13 LAB — COMPREHENSIVE METABOLIC PANEL
ALBUMIN: 3.4 g/dL — AB (ref 3.5–5.0)
ALT: 37 U/L (ref 0–55)
AST: 23 U/L (ref 5–34)
Alkaline Phosphatase: 66 U/L (ref 40–150)
Anion Gap: 8 mEq/L (ref 3–11)
BILIRUBIN TOTAL: 0.49 mg/dL (ref 0.20–1.20)
BUN: 11.6 mg/dL (ref 7.0–26.0)
CALCIUM: 8.7 mg/dL (ref 8.4–10.4)
CHLORIDE: 111 meq/L — AB (ref 98–109)
CO2: 22 mEq/L (ref 22–29)
CREATININE: 0.8 mg/dL (ref 0.7–1.3)
EGFR: >90 mL/min/{1.73_m2} (ref >90)
Glucose: 107 mg/dl (ref 70–140)
Potassium: 4.2 mEq/L (ref 3.5–5.1)
Sodium: 141 mEq/L (ref 136–145)
TOTAL PROTEIN: 6.8 g/dL (ref 6.4–8.3)

## 2016-04-13 MED ORDER — IOPAMIDOL (ISOVUE-300) INJECTION 61%
75.0000 mL | Freq: Once | INTRAVENOUS | Status: AC | PRN
  Administered 2016-04-13: 75 mL via INTRAVENOUS

## 2016-04-14 ENCOUNTER — Ambulatory Visit: Payer: Medicare Other | Admitting: Internal Medicine

## 2016-04-14 ENCOUNTER — Encounter: Payer: Self-pay | Admitting: Internal Medicine

## 2016-04-14 DIAGNOSIS — E119 Type 2 diabetes mellitus without complications: Secondary | ICD-10-CM | POA: Diagnosis not present

## 2016-04-14 DIAGNOSIS — Z4682 Encounter for fitting and adjustment of non-vascular catheter: Secondary | ICD-10-CM | POA: Diagnosis not present

## 2016-04-14 DIAGNOSIS — E785 Hyperlipidemia, unspecified: Secondary | ICD-10-CM | POA: Diagnosis not present

## 2016-04-14 DIAGNOSIS — Z720 Tobacco use: Secondary | ICD-10-CM | POA: Diagnosis not present

## 2016-04-14 DIAGNOSIS — Z483 Aftercare following surgery for neoplasm: Secondary | ICD-10-CM | POA: Diagnosis not present

## 2016-04-14 DIAGNOSIS — J45909 Unspecified asthma, uncomplicated: Secondary | ICD-10-CM | POA: Diagnosis not present

## 2016-04-14 DIAGNOSIS — K219 Gastro-esophageal reflux disease without esophagitis: Secondary | ICD-10-CM | POA: Diagnosis not present

## 2016-04-14 DIAGNOSIS — C3411 Malignant neoplasm of upper lobe, right bronchus or lung: Secondary | ICD-10-CM | POA: Diagnosis not present

## 2016-04-14 DIAGNOSIS — I739 Peripheral vascular disease, unspecified: Secondary | ICD-10-CM | POA: Diagnosis not present

## 2016-04-18 DIAGNOSIS — Z4682 Encounter for fitting and adjustment of non-vascular catheter: Secondary | ICD-10-CM | POA: Diagnosis not present

## 2016-04-18 DIAGNOSIS — J45909 Unspecified asthma, uncomplicated: Secondary | ICD-10-CM | POA: Diagnosis not present

## 2016-04-18 DIAGNOSIS — K219 Gastro-esophageal reflux disease without esophagitis: Secondary | ICD-10-CM | POA: Diagnosis not present

## 2016-04-18 DIAGNOSIS — I739 Peripheral vascular disease, unspecified: Secondary | ICD-10-CM | POA: Diagnosis not present

## 2016-04-18 DIAGNOSIS — E119 Type 2 diabetes mellitus without complications: Secondary | ICD-10-CM | POA: Diagnosis not present

## 2016-04-18 DIAGNOSIS — C3411 Malignant neoplasm of upper lobe, right bronchus or lung: Secondary | ICD-10-CM | POA: Diagnosis not present

## 2016-04-18 DIAGNOSIS — E785 Hyperlipidemia, unspecified: Secondary | ICD-10-CM | POA: Diagnosis not present

## 2016-04-18 DIAGNOSIS — Z720 Tobacco use: Secondary | ICD-10-CM | POA: Diagnosis not present

## 2016-04-18 DIAGNOSIS — Z483 Aftercare following surgery for neoplasm: Secondary | ICD-10-CM | POA: Diagnosis not present

## 2016-04-20 ENCOUNTER — Ambulatory Visit: Payer: Self-pay | Admitting: Internal Medicine

## 2016-04-22 DIAGNOSIS — E119 Type 2 diabetes mellitus without complications: Secondary | ICD-10-CM | POA: Diagnosis not present

## 2016-04-22 DIAGNOSIS — Z4682 Encounter for fitting and adjustment of non-vascular catheter: Secondary | ICD-10-CM | POA: Diagnosis not present

## 2016-04-22 DIAGNOSIS — Z720 Tobacco use: Secondary | ICD-10-CM | POA: Diagnosis not present

## 2016-04-22 DIAGNOSIS — E785 Hyperlipidemia, unspecified: Secondary | ICD-10-CM | POA: Diagnosis not present

## 2016-04-22 DIAGNOSIS — I739 Peripheral vascular disease, unspecified: Secondary | ICD-10-CM | POA: Diagnosis not present

## 2016-04-22 DIAGNOSIS — K219 Gastro-esophageal reflux disease without esophagitis: Secondary | ICD-10-CM | POA: Diagnosis not present

## 2016-04-22 DIAGNOSIS — Z483 Aftercare following surgery for neoplasm: Secondary | ICD-10-CM | POA: Diagnosis not present

## 2016-04-22 DIAGNOSIS — J45909 Unspecified asthma, uncomplicated: Secondary | ICD-10-CM | POA: Diagnosis not present

## 2016-04-22 DIAGNOSIS — C3411 Malignant neoplasm of upper lobe, right bronchus or lung: Secondary | ICD-10-CM | POA: Diagnosis not present

## 2016-04-25 ENCOUNTER — Telehealth: Payer: Self-pay | Admitting: Internal Medicine

## 2016-04-25 NOTE — Telephone Encounter (Signed)
pt called to resched missed apt with MD , resched apt and pt is aware

## 2016-04-26 DIAGNOSIS — E119 Type 2 diabetes mellitus without complications: Secondary | ICD-10-CM | POA: Diagnosis not present

## 2016-04-26 DIAGNOSIS — I739 Peripheral vascular disease, unspecified: Secondary | ICD-10-CM | POA: Diagnosis not present

## 2016-04-26 DIAGNOSIS — J45909 Unspecified asthma, uncomplicated: Secondary | ICD-10-CM | POA: Diagnosis not present

## 2016-04-26 DIAGNOSIS — E785 Hyperlipidemia, unspecified: Secondary | ICD-10-CM | POA: Diagnosis not present

## 2016-04-26 DIAGNOSIS — K219 Gastro-esophageal reflux disease without esophagitis: Secondary | ICD-10-CM | POA: Diagnosis not present

## 2016-04-26 DIAGNOSIS — C3411 Malignant neoplasm of upper lobe, right bronchus or lung: Secondary | ICD-10-CM | POA: Diagnosis not present

## 2016-04-26 DIAGNOSIS — Z4682 Encounter for fitting and adjustment of non-vascular catheter: Secondary | ICD-10-CM | POA: Diagnosis not present

## 2016-04-26 DIAGNOSIS — Z483 Aftercare following surgery for neoplasm: Secondary | ICD-10-CM | POA: Diagnosis not present

## 2016-04-26 DIAGNOSIS — Z720 Tobacco use: Secondary | ICD-10-CM | POA: Diagnosis not present

## 2016-04-28 DIAGNOSIS — J45909 Unspecified asthma, uncomplicated: Secondary | ICD-10-CM | POA: Diagnosis not present

## 2016-04-28 DIAGNOSIS — Z4682 Encounter for fitting and adjustment of non-vascular catheter: Secondary | ICD-10-CM | POA: Diagnosis not present

## 2016-04-28 DIAGNOSIS — K219 Gastro-esophageal reflux disease without esophagitis: Secondary | ICD-10-CM | POA: Diagnosis not present

## 2016-04-28 DIAGNOSIS — E119 Type 2 diabetes mellitus without complications: Secondary | ICD-10-CM | POA: Diagnosis not present

## 2016-04-28 DIAGNOSIS — Z483 Aftercare following surgery for neoplasm: Secondary | ICD-10-CM | POA: Diagnosis not present

## 2016-04-28 DIAGNOSIS — Z720 Tobacco use: Secondary | ICD-10-CM | POA: Diagnosis not present

## 2016-04-28 DIAGNOSIS — E785 Hyperlipidemia, unspecified: Secondary | ICD-10-CM | POA: Diagnosis not present

## 2016-04-28 DIAGNOSIS — I739 Peripheral vascular disease, unspecified: Secondary | ICD-10-CM | POA: Diagnosis not present

## 2016-04-28 DIAGNOSIS — C3411 Malignant neoplasm of upper lobe, right bronchus or lung: Secondary | ICD-10-CM | POA: Diagnosis not present

## 2016-05-04 DIAGNOSIS — J45909 Unspecified asthma, uncomplicated: Secondary | ICD-10-CM | POA: Diagnosis not present

## 2016-05-04 DIAGNOSIS — B1910 Unspecified viral hepatitis B without hepatic coma: Secondary | ICD-10-CM | POA: Diagnosis not present

## 2016-05-04 DIAGNOSIS — Z4682 Encounter for fitting and adjustment of non-vascular catheter: Secondary | ICD-10-CM | POA: Diagnosis not present

## 2016-05-04 DIAGNOSIS — C3411 Malignant neoplasm of upper lobe, right bronchus or lung: Secondary | ICD-10-CM | POA: Diagnosis not present

## 2016-05-04 DIAGNOSIS — I739 Peripheral vascular disease, unspecified: Secondary | ICD-10-CM | POA: Diagnosis not present

## 2016-05-04 DIAGNOSIS — K219 Gastro-esophageal reflux disease without esophagitis: Secondary | ICD-10-CM | POA: Diagnosis not present

## 2016-05-04 DIAGNOSIS — F209 Schizophrenia, unspecified: Secondary | ICD-10-CM | POA: Diagnosis not present

## 2016-05-04 DIAGNOSIS — F1010 Alcohol abuse, uncomplicated: Secondary | ICD-10-CM | POA: Diagnosis not present

## 2016-05-04 DIAGNOSIS — E119 Type 2 diabetes mellitus without complications: Secondary | ICD-10-CM | POA: Diagnosis not present

## 2016-05-04 DIAGNOSIS — E785 Hyperlipidemia, unspecified: Secondary | ICD-10-CM | POA: Diagnosis not present

## 2016-05-04 DIAGNOSIS — Z483 Aftercare following surgery for neoplasm: Secondary | ICD-10-CM | POA: Diagnosis not present

## 2016-05-04 DIAGNOSIS — Z720 Tobacco use: Secondary | ICD-10-CM | POA: Diagnosis not present

## 2016-05-05 ENCOUNTER — Ambulatory Visit (HOSPITAL_BASED_OUTPATIENT_CLINIC_OR_DEPARTMENT_OTHER): Payer: Medicare Other | Admitting: Internal Medicine

## 2016-05-05 ENCOUNTER — Encounter: Payer: Self-pay | Admitting: Internal Medicine

## 2016-05-05 ENCOUNTER — Telehealth: Payer: Self-pay | Admitting: Emergency Medicine

## 2016-05-05 VITALS — BP 122/88 | HR 93 | Temp 98.3°F | Resp 18 | Ht 75.0 in | Wt 196.2 lb

## 2016-05-05 DIAGNOSIS — Z85118 Personal history of other malignant neoplasm of bronchus and lung: Secondary | ICD-10-CM | POA: Diagnosis not present

## 2016-05-05 DIAGNOSIS — C3491 Malignant neoplasm of unspecified part of right bronchus or lung: Secondary | ICD-10-CM

## 2016-05-05 DIAGNOSIS — Z720 Tobacco use: Secondary | ICD-10-CM | POA: Diagnosis not present

## 2016-05-05 DIAGNOSIS — F17210 Nicotine dependence, cigarettes, uncomplicated: Secondary | ICD-10-CM

## 2016-05-05 NOTE — Telephone Encounter (Signed)
[facility] wants to know if she can get verbal order to continue skilled nursing. Please follow up thanks.

## 2016-05-05 NOTE — Telephone Encounter (Signed)
Vebal okay given to continue skilled nursing.

## 2016-05-05 NOTE — Patient Instructions (Signed)
Smoking Cessation, Tips for Success If you are ready to quit smoking, congratulations! You have chosen to help yourself be healthier. Cigarettes bring nicotine, tar, carbon monoxide, and other irritants into your body. Your lungs, heart, and blood vessels will be able to work better without these poisons. There are many different ways to quit smoking. Nicotine gum, nicotine patches, a nicotine inhaler, or nicotine nasal spray can help with physical craving. Hypnosis, support groups, and medicines help break the habit of smoking. WHAT THINGS CAN I DO TO MAKE QUITTING EASIER?  Here are some tips to help you quit for good:  Pick a date when you will quit smoking completely. Tell all of your friends and family about your plan to quit on that date.  Do not try to slowly cut down on the number of cigarettes you are smoking. Pick a quit date and quit smoking completely starting on that day.  Throw away all cigarettes.   Clean and remove all ashtrays from your home, work, and car.  On a card, write down your reasons for quitting. Carry the card with you and read it when you get the urge to smoke.  Cleanse your body of nicotine. Drink enough water and fluids to keep your urine clear or pale yellow. Do this after quitting to flush the nicotine from your body.  Learn to predict your moods. Do not let a bad situation be your excuse to have a cigarette. Some situations in your life might tempt you into wanting a cigarette.  Never have "just one" cigarette. It leads to wanting another and another. Remind yourself of your decision to quit.  Change habits associated with smoking. If you smoked while driving or when feeling stressed, try other activities to replace smoking. Stand up when drinking your coffee. Brush your teeth after eating. Sit in a different chair when you read the paper. Avoid alcohol while trying to quit, and try to drink fewer caffeinated beverages. Alcohol and caffeine may urge you to  smoke.  Avoid foods and drinks that can trigger a desire to smoke, such as sugary or spicy foods and alcohol.  Ask people who smoke not to smoke around you.  Have something planned to do right after eating or having a cup of coffee. For example, plan to take a walk or exercise.  Try a relaxation exercise to calm you down and decrease your stress. Remember, you may be tense and nervous for the first 2 weeks after you quit, but this will pass.  Find new activities to keep your hands busy. Play with a pen, coin, or rubber band. Doodle or draw things on paper.  Brush your teeth right after eating. This will help cut down on the craving for the taste of tobacco after meals. You can also try mouthwash.   Use oral substitutes in place of cigarettes. Try using lemon drops, carrots, cinnamon sticks, or chewing gum. Keep them handy so they are available when you have the urge to smoke.  When you have the urge to smoke, try deep breathing.  Designate your home as a nonsmoking area.  If you are a heavy smoker, ask your health care provider about a prescription for nicotine chewing gum. It can ease your withdrawal from nicotine.  Reward yourself. Set aside the cigarette money you save and buy yourself something nice.  Look for support from others. Join a support group or smoking cessation program. Ask someone at home or at work to help you with your plan   to quit smoking.  Always ask yourself, "Do I need this cigarette or is this just a reflex?" Tell yourself, "Today, I choose not to smoke," or "I do not want to smoke." You are reminding yourself of your decision to quit.  Do not replace cigarette smoking with electronic cigarettes (commonly called e-cigarettes). The safety of e-cigarettes is unknown, and some may contain harmful chemicals.  If you relapse, do not give up! Plan ahead and think about what you will do the next time you get the urge to smoke. HOW WILL I FEEL WHEN I QUIT SMOKING? You  may have symptoms of withdrawal because your body is used to nicotine (the addictive substance in cigarettes). You may crave cigarettes, be irritable, feel very hungry, cough often, get headaches, or have difficulty concentrating. The withdrawal symptoms are only temporary. They are strongest when you first quit but will go away within 10-14 days. When withdrawal symptoms occur, stay in control. Think about your reasons for quitting. Remind yourself that these are signs that your body is healing and getting used to being without cigarettes. Remember that withdrawal symptoms are easier to treat than the major diseases that smoking can cause.  Even after the withdrawal is over, expect periodic urges to smoke. However, these cravings are generally short lived and will go away whether you smoke or not. Do not smoke! WHAT RESOURCES ARE AVAILABLE TO HELP ME QUIT SMOKING? Your health care provider can direct you to community resources or hospitals for support, which may include:  Group support.  Education.  Hypnosis.  Therapy.   This information is not intended to replace advice given to you by your health care provider. Make sure you discuss any questions you have with your health care provider.   Document Released: 06/10/2004 Document Revised: 10/03/2014 Document Reviewed: 02/28/2013 Elsevier Interactive Patient Education 2016 [employer] Inc[employer]  
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
## 2016-05-09 DIAGNOSIS — Z483 Aftercare following surgery for neoplasm: Secondary | ICD-10-CM | POA: Diagnosis not present

## 2016-05-09 DIAGNOSIS — Z720 Tobacco use: Secondary | ICD-10-CM | POA: Diagnosis not present

## 2016-05-09 DIAGNOSIS — K219 Gastro-esophageal reflux disease without esophagitis: Secondary | ICD-10-CM | POA: Diagnosis not present

## 2016-05-09 DIAGNOSIS — E785 Hyperlipidemia, unspecified: Secondary | ICD-10-CM | POA: Diagnosis not present

## 2016-05-09 DIAGNOSIS — J45909 Unspecified asthma, uncomplicated: Secondary | ICD-10-CM | POA: Diagnosis not present

## 2016-05-09 DIAGNOSIS — Z4682 Encounter for fitting and adjustment of non-vascular catheter: Secondary | ICD-10-CM | POA: Diagnosis not present

## 2016-05-09 DIAGNOSIS — C3411 Malignant neoplasm of upper lobe, right bronchus or lung: Secondary | ICD-10-CM | POA: Diagnosis not present

## 2016-05-09 DIAGNOSIS — I739 Peripheral vascular disease, unspecified: Secondary | ICD-10-CM | POA: Diagnosis not present

## 2016-05-09 DIAGNOSIS — E119 Type 2 diabetes mellitus without complications: Secondary | ICD-10-CM | POA: Diagnosis not present

## 2016-05-10 ENCOUNTER — Telehealth: Payer: Self-pay | Admitting: Internal Medicine

## 2016-05-10 DIAGNOSIS — I739 Peripheral vascular disease, unspecified: Secondary | ICD-10-CM | POA: Diagnosis not present

## 2016-05-10 DIAGNOSIS — B1910 Unspecified viral hepatitis B without hepatic coma: Secondary | ICD-10-CM | POA: Diagnosis not present

## 2016-05-10 DIAGNOSIS — E785 Hyperlipidemia, unspecified: Secondary | ICD-10-CM | POA: Diagnosis not present

## 2016-05-10 DIAGNOSIS — Z483 Aftercare following surgery for neoplasm: Secondary | ICD-10-CM | POA: Diagnosis not present

## 2016-05-10 DIAGNOSIS — C3411 Malignant neoplasm of upper lobe, right bronchus or lung: Secondary | ICD-10-CM | POA: Diagnosis not present

## 2016-05-10 DIAGNOSIS — Z720 Tobacco use: Secondary | ICD-10-CM | POA: Diagnosis not present

## 2016-05-10 DIAGNOSIS — K219 Gastro-esophageal reflux disease without esophagitis: Secondary | ICD-10-CM | POA: Diagnosis not present

## 2016-05-10 DIAGNOSIS — E119 Type 2 diabetes mellitus without complications: Secondary | ICD-10-CM | POA: Diagnosis not present

## 2016-05-10 DIAGNOSIS — Z4682 Encounter for fitting and adjustment of non-vascular catheter: Secondary | ICD-10-CM | POA: Diagnosis not present

## 2016-05-10 DIAGNOSIS — F209 Schizophrenia, unspecified: Secondary | ICD-10-CM | POA: Diagnosis not present

## 2016-05-10 DIAGNOSIS — J45909 Unspecified asthma, uncomplicated: Secondary | ICD-10-CM | POA: Diagnosis not present

## 2016-05-10 DIAGNOSIS — F1010 Alcohol abuse, uncomplicated: Secondary | ICD-10-CM | POA: Diagnosis not present

## 2016-05-10 NOTE — Telephone Encounter (Signed)
Tillie Rung, PT, called from [facility] request verbal order to extend PT once a week for 4 weeks. Pt had a fall last week. Please call her back

## 2016-05-10 NOTE — Telephone Encounter (Signed)
Verbal order given.   Pt did suffer a minor injury from fall. He had an abrasion that has healed.

## 2016-05-12 DIAGNOSIS — K219 Gastro-esophageal reflux disease without esophagitis: Secondary | ICD-10-CM | POA: Diagnosis not present

## 2016-05-12 DIAGNOSIS — C3411 Malignant neoplasm of upper lobe, right bronchus or lung: Secondary | ICD-10-CM | POA: Diagnosis not present

## 2016-05-12 DIAGNOSIS — J45909 Unspecified asthma, uncomplicated: Secondary | ICD-10-CM | POA: Diagnosis not present

## 2016-05-12 DIAGNOSIS — F209 Schizophrenia, unspecified: Secondary | ICD-10-CM | POA: Diagnosis not present

## 2016-05-12 DIAGNOSIS — E785 Hyperlipidemia, unspecified: Secondary | ICD-10-CM | POA: Diagnosis not present

## 2016-05-12 DIAGNOSIS — I739 Peripheral vascular disease, unspecified: Secondary | ICD-10-CM | POA: Diagnosis not present

## 2016-05-12 DIAGNOSIS — Z4682 Encounter for fitting and adjustment of non-vascular catheter: Secondary | ICD-10-CM | POA: Diagnosis not present

## 2016-05-12 DIAGNOSIS — Z483 Aftercare following surgery for neoplasm: Secondary | ICD-10-CM | POA: Diagnosis not present

## 2016-05-12 DIAGNOSIS — F1010 Alcohol abuse, uncomplicated: Secondary | ICD-10-CM | POA: Diagnosis not present

## 2016-05-12 DIAGNOSIS — Z720 Tobacco use: Secondary | ICD-10-CM | POA: Diagnosis not present

## 2016-05-12 DIAGNOSIS — B1910 Unspecified viral hepatitis B without hepatic coma: Secondary | ICD-10-CM | POA: Diagnosis not present

## 2016-05-12 DIAGNOSIS — E119 Type 2 diabetes mellitus without complications: Secondary | ICD-10-CM | POA: Diagnosis not present

## 2016-05-13 ENCOUNTER — Encounter (HOSPITAL_COMMUNITY): Payer: Self-pay | Admitting: Emergency Medicine

## 2016-05-13 ENCOUNTER — Emergency Department (HOSPITAL_COMMUNITY)
Admission: EM | Admit: 2016-05-13 | Discharge: 2016-05-13 | Disposition: A | Discharge status: Home / Self Care | Payer: Medicare Other | Source: Home / Self Care

## 2016-05-13 ENCOUNTER — Emergency Department (HOSPITAL_COMMUNITY)
Admission: EM | Admit: 2016-05-13 | Discharge: 2016-05-13 | Discharge status: Against Medical Advice | Payer: Medicare Other | Attending: Emergency Medicine | Admitting: Emergency Medicine

## 2016-05-13 ENCOUNTER — Other Ambulatory Visit: Payer: Self-pay

## 2016-05-13 DIAGNOSIS — Z85118 Personal history of other malignant neoplasm of bronchus and lung: Secondary | ICD-10-CM | POA: Diagnosis not present

## 2016-05-13 DIAGNOSIS — R55 Syncope and collapse: Secondary | ICD-10-CM | POA: Insufficient documentation

## 2016-05-13 DIAGNOSIS — I110 Hypertensive heart disease with heart failure: Secondary | ICD-10-CM | POA: Diagnosis not present

## 2016-05-13 DIAGNOSIS — R079 Chest pain, unspecified: Secondary | ICD-10-CM

## 2016-05-13 DIAGNOSIS — J449 Chronic obstructive pulmonary disease, unspecified: Secondary | ICD-10-CM | POA: Insufficient documentation

## 2016-05-13 DIAGNOSIS — I509 Heart failure, unspecified: Secondary | ICD-10-CM | POA: Diagnosis not present

## 2016-05-13 DIAGNOSIS — F17210 Nicotine dependence, cigarettes, uncomplicated: Secondary | ICD-10-CM | POA: Insufficient documentation

## 2016-05-13 DIAGNOSIS — R0602 Shortness of breath: Secondary | ICD-10-CM | POA: Diagnosis not present

## 2016-05-13 DIAGNOSIS — Z5321 Procedure and treatment not carried out due to patient leaving prior to being seen by health care provider: Secondary | ICD-10-CM

## 2016-05-13 DIAGNOSIS — J441 Chronic obstructive pulmonary disease with (acute) exacerbation: Secondary | ICD-10-CM | POA: Diagnosis not present

## 2016-05-13 DIAGNOSIS — Z955 Presence of coronary angioplasty implant and graft: Secondary | ICD-10-CM | POA: Diagnosis not present

## 2016-05-13 DIAGNOSIS — R0789 Other chest pain: Secondary | ICD-10-CM | POA: Diagnosis not present

## 2016-05-13 LAB — COMPREHENSIVE METABOLIC PANEL
ALBUMIN: 3.9 g/dL (ref 3.5–5.0)
ALK PHOS: 71 U/L (ref 38–126)
ALT: 43 U/L (ref 17–63)
ANION GAP: 8 (ref 5–15)
AST: 39 U/L (ref 15–41)
BILIRUBIN TOTAL: 0.4 mg/dL (ref 0.3–1.2)
BUN: 5 mg/dL — AB (ref 6–20)
CALCIUM: 8.9 mg/dL (ref 8.9–10.3)
CO2: 19 mmol/L — ABNORMAL LOW (ref 22–32)
CREATININE: 0.82 mg/dL (ref 0.61–1.24)
Chloride: 113 mmol/L — ABNORMAL HIGH (ref 101–111)
GFR calc Af Amer: >60 mL/min (ref >60)
GFR calc non Af Amer: >60 mL/min (ref >60)
GLUCOSE: 119 mg/dL — AB (ref 65–99)
Potassium: 4 mmol/L (ref 3.5–5.1)
Sodium: 140 mmol/L (ref 135–145)
TOTAL PROTEIN: 7.5 g/dL (ref 6.5–8.1)

## 2016-05-13 LAB — CBC WITH DIFFERENTIAL/PLATELET
Basophils Absolute: 0 10*3/uL (ref 0.0–0.1)
Basophils Relative: 0 %
Eosinophils Absolute: 0.1 10*3/uL (ref 0.0–0.7)
Eosinophils Relative: 1 %
HEMATOCRIT: 46.1 % (ref 39.0–52.0)
HEMOGLOBIN: 14.7 g/dL (ref 13.0–17.0)
LYMPHS ABS: 2 10*3/uL (ref 0.7–4.0)
LYMPHS PCT: 31 %
MCH: 26.1 pg (ref 26.0–34.0)
MCHC: 31.9 g/dL (ref 30.0–36.0)
MCV: 81.9 fL (ref 78.0–100.0)
MONOS PCT: 6 %
Monocytes Absolute: 0.4 10*3/uL (ref 0.1–1.0)
NEUTROS ABS: 4 10*3/uL (ref 1.7–7.7)
NEUTROS PCT: 62 %
Platelets: 149 10*3/uL — ABNORMAL LOW (ref 150–400)
RBC: 5.63 MIL/uL (ref 4.22–5.81)
RDW: 18.8 % — ABNORMAL HIGH (ref 11.5–15.5)
WBC: 6.4 10*3/uL (ref 4.0–10.5)

## 2016-05-13 LAB — TROPONIN I: Troponin I: <0.03 ng/mL (ref <0.03)

## 2016-05-13 MED ORDER — IPRATROPIUM-ALBUTEROL 0.5-2.5 (3) MG/3ML IN SOLN: 3.0000 mL | Freq: Once | RESPIRATORY_TRACT | Status: DC

## 2016-05-13 MED ORDER — METHYLPREDNISOLONE SODIUM SUCC 125 MG IJ SOLR: 125.0000 mg | Freq: Once | INTRAMUSCULAR | Status: DC

## 2016-05-13 NOTE — ED Notes (Signed)
Pt being very verbally aggressive to staff, threatening to leave AMA.
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
## 2016-05-13 NOTE — ED Triage Notes (Signed)
Pt in from jail with c/o throbbing, central CP. Pt was in holding cell last night, started c/o cp and kept trying to smoke while wearing oxygen. Pt was given 1 tab NTG, reported relief. Hx of COPD, wears 2L [place], CHF, Lung CA.

## 2016-05-13 NOTE — ED Triage Notes (Signed)
Pt brought in to triage per ems, pts second time today and was escorted out  With [employer] this am after walking out.  He was in office  Of his apt complex and had a syncopal episode. thenhe is placed in truck and c/o of chest pain.

## 2016-05-13 NOTE — ED Notes (Signed)
Security called and [place] called when pt started to  yell upon arriving that he wanted to speak to charge nurse . Security arrived and so did [place] . [place] spoke to pt and then  I went in to finish triage . Pt wanted me to call his medical POA and tell them he was in hospital. I told him that I had to finish his triage and to give me a few mins, he pulls off bp cuff and stands up and leaves. Security here and aware that pt is leaving.

## 2016-05-19 DIAGNOSIS — J45909 Unspecified asthma, uncomplicated: Secondary | ICD-10-CM | POA: Diagnosis not present

## 2016-05-19 DIAGNOSIS — B1910 Unspecified viral hepatitis B without hepatic coma: Secondary | ICD-10-CM | POA: Diagnosis not present

## 2016-05-19 DIAGNOSIS — E785 Hyperlipidemia, unspecified: Secondary | ICD-10-CM | POA: Diagnosis not present

## 2016-05-19 DIAGNOSIS — K219 Gastro-esophageal reflux disease without esophagitis: Secondary | ICD-10-CM | POA: Diagnosis not present

## 2016-05-19 DIAGNOSIS — I739 Peripheral vascular disease, unspecified: Secondary | ICD-10-CM | POA: Diagnosis not present

## 2016-05-19 DIAGNOSIS — F1010 Alcohol abuse, uncomplicated: Secondary | ICD-10-CM | POA: Diagnosis not present

## 2016-05-19 DIAGNOSIS — Z720 Tobacco use: Secondary | ICD-10-CM | POA: Diagnosis not present

## 2016-05-19 DIAGNOSIS — F209 Schizophrenia, unspecified: Secondary | ICD-10-CM | POA: Diagnosis not present

## 2016-05-19 DIAGNOSIS — Z4682 Encounter for fitting and adjustment of non-vascular catheter: Secondary | ICD-10-CM | POA: Diagnosis not present

## 2016-05-19 DIAGNOSIS — C3411 Malignant neoplasm of upper lobe, right bronchus or lung: Secondary | ICD-10-CM | POA: Diagnosis not present

## 2016-05-19 DIAGNOSIS — E119 Type 2 diabetes mellitus without complications: Secondary | ICD-10-CM | POA: Diagnosis not present

## 2016-05-19 DIAGNOSIS — Z483 Aftercare following surgery for neoplasm: Secondary | ICD-10-CM | POA: Diagnosis not present

## 2016-05-20 ENCOUNTER — Telehealth: Payer: Self-pay | Admitting: Emergency Medicine

## 2016-05-20 DIAGNOSIS — E785 Hyperlipidemia, unspecified: Secondary | ICD-10-CM | POA: Diagnosis not present

## 2016-05-20 DIAGNOSIS — B1910 Unspecified viral hepatitis B without hepatic coma: Secondary | ICD-10-CM | POA: Diagnosis not present

## 2016-05-20 DIAGNOSIS — C3411 Malignant neoplasm of upper lobe, right bronchus or lung: Secondary | ICD-10-CM | POA: Diagnosis not present

## 2016-05-20 DIAGNOSIS — J45909 Unspecified asthma, uncomplicated: Secondary | ICD-10-CM | POA: Diagnosis not present

## 2016-05-20 DIAGNOSIS — Z720 Tobacco use: Secondary | ICD-10-CM | POA: Diagnosis not present

## 2016-05-20 DIAGNOSIS — Z483 Aftercare following surgery for neoplasm: Secondary | ICD-10-CM | POA: Diagnosis not present

## 2016-05-20 DIAGNOSIS — Z4682 Encounter for fitting and adjustment of non-vascular catheter: Secondary | ICD-10-CM | POA: Diagnosis not present

## 2016-05-20 DIAGNOSIS — F209 Schizophrenia, unspecified: Secondary | ICD-10-CM | POA: Diagnosis not present

## 2016-05-20 DIAGNOSIS — E119 Type 2 diabetes mellitus without complications: Secondary | ICD-10-CM | POA: Diagnosis not present

## 2016-05-20 DIAGNOSIS — K219 Gastro-esophageal reflux disease without esophagitis: Secondary | ICD-10-CM | POA: Diagnosis not present

## 2016-05-20 DIAGNOSIS — F1010 Alcohol abuse, uncomplicated: Secondary | ICD-10-CM | POA: Diagnosis not present

## 2016-05-20 DIAGNOSIS — I739 Peripheral vascular disease, unspecified: Secondary | ICD-10-CM | POA: Diagnosis not present

## 2016-05-20 MED ORDER — CARVEDILOL 12.5 MG PO TABS: 12.5000 mg | ORAL_TABLET | Freq: Two times a day (BID) | ORAL | 3 refills | Status: DC

## 2016-05-20 NOTE — Telephone Encounter (Signed)
Patient called and needs a prescription refill on his blood pressure medicine carvedilol (COREG) 12.5 MG tablet.  Pharmacy is [facility]. Please follow up thanks.

## 2016-05-20 NOTE — Telephone Encounter (Signed)
Contacted pt to confirm where to send rx.  Pt confirmed [facility].   Pt stated that he was upset with our office. He called to request the refill and stated we were laughing on the phone and that was unacceptable behavior.

## 2016-05-24 DIAGNOSIS — Z4682 Encounter for fitting and adjustment of non-vascular catheter: Secondary | ICD-10-CM | POA: Diagnosis not present

## 2016-05-24 DIAGNOSIS — K219 Gastro-esophageal reflux disease without esophagitis: Secondary | ICD-10-CM | POA: Diagnosis not present

## 2016-05-24 DIAGNOSIS — E785 Hyperlipidemia, unspecified: Secondary | ICD-10-CM | POA: Diagnosis not present

## 2016-05-24 DIAGNOSIS — B1910 Unspecified viral hepatitis B without hepatic coma: Secondary | ICD-10-CM | POA: Diagnosis not present

## 2016-05-24 DIAGNOSIS — Z483 Aftercare following surgery for neoplasm: Secondary | ICD-10-CM | POA: Diagnosis not present

## 2016-05-24 DIAGNOSIS — F1010 Alcohol abuse, uncomplicated: Secondary | ICD-10-CM | POA: Diagnosis not present

## 2016-05-24 DIAGNOSIS — J45909 Unspecified asthma, uncomplicated: Secondary | ICD-10-CM | POA: Diagnosis not present

## 2016-05-24 DIAGNOSIS — F209 Schizophrenia, unspecified: Secondary | ICD-10-CM | POA: Diagnosis not present

## 2016-05-24 DIAGNOSIS — Z720 Tobacco use: Secondary | ICD-10-CM | POA: Diagnosis not present

## 2016-05-24 DIAGNOSIS — C3411 Malignant neoplasm of upper lobe, right bronchus or lung: Secondary | ICD-10-CM | POA: Diagnosis not present

## 2016-05-24 DIAGNOSIS — E119 Type 2 diabetes mellitus without complications: Secondary | ICD-10-CM | POA: Diagnosis not present

## 2016-05-24 DIAGNOSIS — I739 Peripheral vascular disease, unspecified: Secondary | ICD-10-CM | POA: Diagnosis not present

## 2016-05-25 ENCOUNTER — Other Ambulatory Visit: Payer: Self-pay | Admitting: Pharmacist

## 2016-05-25 ENCOUNTER — Ambulatory Visit: Payer: Self-pay | Admitting: Internal Medicine

## 2016-05-25 ENCOUNTER — Encounter: Payer: Self-pay | Admitting: Internal Medicine

## 2016-05-25 ENCOUNTER — Telehealth: Payer: Self-pay | Admitting: Internal Medicine

## 2016-05-25 NOTE — Telephone Encounter (Signed)
Patient no showed for follow up on lab work 8/30.  Please advise.

## 2016-05-25 NOTE — Patient Outreach (Signed)
Outreach call to [name] regarding his request for follow up from the [facility] Medication Adherence Campaign. Left a HIPAA compliant message on the patient's voicemail.  [name], PharmD Clinical Pharmacist [place] [facility] [phone]

## 2016-05-25 NOTE — Telephone Encounter (Signed)
Call him 

## 2016-05-26 NOTE — Telephone Encounter (Signed)
Got patient scheduled
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
## 2016-06-01 ENCOUNTER — Encounter: Payer: Self-pay | Admitting: Internal Medicine

## 2016-06-01 ENCOUNTER — Ambulatory Visit (INDEPENDENT_AMBULATORY_CARE_PROVIDER_SITE_OTHER): Payer: Medicare Other | Admitting: Internal Medicine

## 2016-06-01 ENCOUNTER — Other Ambulatory Visit (INDEPENDENT_AMBULATORY_CARE_PROVIDER_SITE_OTHER): Payer: Medicare Other

## 2016-06-01 VITALS — BP 116/68 | HR 96 | Temp 98.9°F | Resp 20 | Ht 75.0 in | Wt 200.8 lb

## 2016-06-01 DIAGNOSIS — I10 Essential (primary) hypertension: Secondary | ICD-10-CM

## 2016-06-01 DIAGNOSIS — Z23 Encounter for immunization: Secondary | ICD-10-CM | POA: Diagnosis not present

## 2016-06-01 DIAGNOSIS — F200 Paranoid schizophrenia: Secondary | ICD-10-CM | POA: Diagnosis not present

## 2016-06-01 DIAGNOSIS — Z7251 High risk heterosexual behavior: Secondary | ICD-10-CM | POA: Diagnosis not present

## 2016-06-01 DIAGNOSIS — A530 Latent syphilis, unspecified as early or late: Secondary | ICD-10-CM | POA: Diagnosis not present

## 2016-06-01 DIAGNOSIS — J449 Chronic obstructive pulmonary disease, unspecified: Secondary | ICD-10-CM

## 2016-06-01 LAB — COMPREHENSIVE METABOLIC PANEL
ALBUMIN: 4 g/dL (ref 3.5–5.2)
ALK PHOS: 67 U/L (ref 39–117)
ALT: 30 U/L (ref 0–53)
AST: 25 U/L (ref 0–37)
BUN: 9 mg/dL (ref 6–23)
CALCIUM: 8.9 mg/dL (ref 8.4–10.5)
CHLORIDE: 107 meq/L (ref 96–112)
CO2: 26 mEq/L (ref 19–32)
Creatinine, Ser: 0.79 mg/dL (ref 0.40–1.50)
GFR: 130.32 mL/min (ref >60.00)
Glucose, Bld: 109 mg/dL — ABNORMAL HIGH (ref 70–99)
POTASSIUM: 4.3 meq/L (ref 3.5–5.1)
Sodium: 139 mEq/L (ref 135–145)
TOTAL PROTEIN: 7.3 g/dL (ref 6.0–8.3)
Total Bilirubin: 0.4 mg/dL (ref 0.2–1.2)

## 2016-06-01 NOTE — Progress Notes (Signed)
Pre visit review using our clinic review tool, if applicable. No additional management support is needed unless otherwise documented below in the visit note. 

## 2016-06-01 NOTE — Patient Instructions (Signed)
Syphilis Syphilis is an infectious disease. It can cause serious complications if left untreated.  CAUSES  Syphilis is caused by a type of bacteria called Treponema pallidum. It is most commonly spread through sexual contact. Syphilis may also spread to a fetus through the blood of the mother.  SIGNS AND SYMPTOMS Symptoms vary depending on the stage of the disease. Some symptoms may disappear without treatment. However, this does not mean that the infection is gone. One form of syphilis (called latent syphilis) has no symptoms.  Primary Syphilis  Painless sores (chancres) in and around the genital organs and mouth.  Swollen lymph nodes near the sores. Secondary Syphilis  A rash or sores over any portion of the body, including the palms of the hands and soles of the feet.  Fever.  Headache.  Sore throat.  Swollen lymph nodes.  New sores in the mouth or on the genitals.  Feeling generally ill.  Having pain in the joints. Tertiary Syphilis The third stage of syphilis involves severe damage to different organs in the body, such as the brain, spinal cord, and heart. Signs and symptoms may include:   Dementia.  Personality and mood changes.  Difficulty walking.  Heart failure.  Fainting.  Enlargement (aneurysm) of the aorta.  Tumors of the skin, bones, or liver.  Muscle weakness.  Sudden "lightning" pains, numbness, or tingling.  Problems with coordination.  Vision changes. DIAGNOSIS   A physical exam will be done.  Blood tests will be done to confirm the diagnosis.  If the disease is in the first or second stages, a fluid (drainage) sample from a sore or rash may be examined under a microscope to detect the disease-causing bacteria.  Fluid around the spine may need to be examined to detect brain damage or inflammation of the brain lining (meningitis).  If the disease is in the third stage, X-rays, CT scans, MRIs, echocardiograms, ultrasounds, or cardiac  catheterization may also be done to detect disease of the heart, aorta, or brain. TREATMENT  Syphilis can be cured with antibiotic medicine if a diagnosis is made early. During the first day of treatment, you may experience fever, chills, headache, nausea, or aching all over your body. This is a normal reaction to the antibiotics.  HOME CARE INSTRUCTIONS   Take your antibiotic medicine as directed by your health care provider. Finish the antibiotic even if you start to feel better. Incomplete treatment will put you at risk for continued infection and could be life threatening.  Take medicines only as directed by your health care provider.  Do not have sexual intercourse until your treatment is completed or as directed by your health care provider.  Inform your recent sexual partners that you were diagnosed with syphilis. They need to seek care and treatment, even if they have no symptoms. It is necessary that all your sexual partners be tested for infection and treated if they have the disease.  Keep all follow-up visits as directed by your health care provider. It is important to keep all your appointments.  If your test results are not ready during your visit, make an appointment with your health care provider to find out the results. Do not assume everything is normal if you have not heard from your health care provider or the medical facility. It is your responsibility to get your test results. SEEK MEDICAL CARE IF:  You continue to have any of the following 24 hours after beginning treatment:  Fever.  Chills.  Headache.  Nausea.  Aching all over your body.  You have symptoms of an allergic reaction to medicine, such as:  Chills.  A headache.  Light-headedness.  A new rash (especially hives).  Difficulty breathing. MAKE SURE YOU:   Understand these instructions.  Will watch your condition.  Will get help right away if you are not doing well or get worse.   This  information is not intended to replace advice given to you by your health care provider. Make sure you discuss any questions you have with your health care provider.   Document Released: 07/03/2013 Document Revised: 10/03/2014 Document Reviewed: 07/03/2013 Elsevier Interactive Patient Education [employer].

## 2016-06-01 NOTE — Progress Notes (Signed)
Subjective:  Patient ID: [name], male    DOB: 04/06/1961  Age: 55 y.o. MRN: [id number]  CC: Hypertension   HPI [name] presents for follow up. He wants me to refer him to a new therapist. He was seeing a therapist somewhere else but tells me they have left the practice and he wants a new therapist to talk to about anxiety and some of his schizophrenia symptoms. He tells me overall his schizophrenia symptoms are very well controlled. He tells me that he has been monogamous with the same partner for 2 years. They live together. They're both monogamous and his partner is negative for syphilis or HIV. The patient doesn't think he needs Truvada anymore and I agree. He otherwise feels well and offers no other complaints today.  Outpatient Medications Prior to Visit  Medication Sig Dispense Refill  . albuterol (PROVENTIL HFA;VENTOLIN HFA) 108 (90 Base) MCG/ACT inhaler Inhale 1-2 puffs into the lungs every 4 (four) hours as needed for wheezing or shortness of breath. 1 Inhaler 0  . carvedilol (COREG) 12.5 MG tablet Take 1 tablet (12.5 mg total) by mouth 2 (two) times daily with a meal. 180 tablet 3  . cetirizine (ZYRTEC) 10 MG tablet Take 1 tablet (10 mg total) by mouth daily. 90 tablet 3  . ciclopirox (PENLAC) 8 % solution Apply topically at bedtime. Apply over nail and surrounding skin. Apply daily over previous coat. After seven (7) days, may remove with alcohol and continue cycle. 6.6 mL 2  . esomeprazole (NEXIUM) 40 MG capsule Take 1 capsule (40 mg total) by mouth every morning. 30 capsule 11  . HYDROcodone-acetaminophen (NORCO) 10-325 MG tablet Take 1 tablet by mouth every 8 (eight) hours as needed. 90 tablet 0  . LIVALO 2 MG TABS TAKE 1 TABLET BY MOUTH EVERY DAY. 30 tablet 11  . mometasone-formoterol (DULERA) 100-5 MCG/ACT AERO Take 2 puffs first thing in am and then another 2 puffs about 12 hours later. 1 Inhaler 11  . PARoxetine (PAXIL) 30 MG tablet Take 30 mg by mouth every  morning.  0  . QUEtiapine (SEROQUEL) 100 MG tablet Take 1 tablet (100 mg total) by mouth at bedtime. 90 tablet 3  . traZODone (DESYREL) 100 MG tablet Take 300 mg by mouth at bedtime.  3  . Vilazodone HCl (VIIBRYD) 40 MG TABS Take 1 tablet (40 mg total) by mouth daily. 30 tablet 11  . TRUVADA 200-300 MG tablet TAKE ONE TABLET BY MOUTH EVERY DAY 30 tablet 5  . varenicline (CHANTIX STARTING MONTH PAK) 0.5 MG X 11 & 1 MG X 42 tablet Take one 0.5 mg tablet by mouth once daily for 3 days, then increase to one 0.5 mg tablet twice daily for 4 days, then increase to one 1 mg tablet twice daily. (Patient not taking: Reported on 06/01/2016) 53 tablet 0   No facility-administered medications prior to visit.     ROS Review of Systems  Constitutional: Negative.  Negative for activity change, chills, fatigue and unexpected weight change.  HENT: Negative.  Negative for sinus pressure, sore throat and trouble swallowing.   Eyes: Negative.  Negative for visual disturbance.  Respiratory: Negative for cough, choking, chest tightness, shortness of breath and stridor.   Cardiovascular: Negative.  Negative for chest pain, palpitations and leg swelling.  Gastrointestinal: Negative.  Negative for abdominal pain, blood in stool, constipation, diarrhea, nausea and vomiting.  Endocrine: Negative.   Genitourinary: Negative.  Negative for difficulty urinating, discharge, dysuria, genital  sores, penile pain, penile swelling, scrotal swelling, testicular pain and urgency.  Musculoskeletal: Negative.  Negative for back pain and myalgias.  Skin: Negative.  Negative for color change and rash.  Allergic/Immunologic: Negative.   Neurological: Negative.  Negative for dizziness, weakness, light-headedness and headaches.  Hematological: Negative.  Negative for adenopathy. Does not bruise/bleed easily.  Psychiatric/Behavioral: Positive for hallucinations. Negative for agitation, behavioral problems, confusion, decreased  concentration, dysphoric mood, self-injury, sleep disturbance and suicidal ideas. The patient is nervous/anxious. The patient is not hyperactive.     Objective:  BP 116/68   Pulse 96   Temp 98.9 F (37.2 C) (Oral)   Resp 20   Ht '6\' 3"'$  (1.905 m)   Wt 200 lb 12.8 oz (91.1 kg)   SpO2 96%   BMI 25.10 kg/m   BP Readings from Last 3 Encounters:  06/01/16 116/68  05/13/16 124/99  05/13/16 132/78    Wt Readings from Last 3 Encounters:  06/01/16 200 lb 12.8 oz (91.1 kg)  05/13/16 197 lb (89.4 kg)  05/05/16 196 lb 3.2 oz (89 kg)    Physical Exam  Constitutional: He is oriented to person, place, and time. No distress.  HENT:  Mouth/Throat: Oropharynx is clear and moist. No oropharyngeal exudate.  Eyes: Conjunctivae are normal. Right eye exhibits no discharge. Left eye exhibits no discharge. No scleral icterus.  Neck: Normal range of motion. Neck supple. No JVD present. No tracheal deviation present. No thyromegaly present.  Cardiovascular: Normal rate, regular rhythm, normal heart sounds and intact distal pulses.  Exam reveals no gallop and no friction rub.   No murmur heard. Pulmonary/Chest: Effort normal and breath sounds normal. No stridor. No respiratory distress. He has no wheezes. He has no rales. He exhibits no tenderness.  Abdominal: Soft. Bowel sounds are normal. He exhibits no distension. There is no tenderness. There is no rebound and no guarding.  Musculoskeletal: Normal range of motion. He exhibits no edema, tenderness or deformity.  Lymphadenopathy:    He has no cervical adenopathy.  Neurological: He is oriented to person, place, and time.  Skin: Skin is warm and dry. No rash noted. He is not diaphoretic. No erythema. No pallor.  Vitals reviewed.   Lab Results  Component Value Date   WBC 6.4 05/13/2016   HGB 14.7 05/13/2016   HCT 46.1 05/13/2016   PLT 149 (L) 05/13/2016   GLUCOSE 119 (H) 05/13/2016   CHOL 134 08/11/2015   TRIG 78.0 08/11/2015   HDL 38.90 (L)  08/11/2015   LDLDIRECT 71.2 09/29/2011   LDLCALC 79 08/11/2015   ALT 43 05/13/2016   AST 39 05/13/2016   NA 140 05/13/2016   K 4.0 05/13/2016   CL 113 (H) 05/13/2016   CREATININE 0.82 05/13/2016   BUN 5 (L) 05/13/2016   CO2 19 (L) 05/13/2016   TSH 1.14 08/11/2015   PSA 0.69 12/05/2013   INR 1.03 08/26/2015   HGBA1C 6.2 03/03/2016   MICROALBUR 0.2 10/08/2008    No results found.  Assessment & Plan:   Clee was seen today for hypertension.  Diagnoses and all orders for this visit:  HYPERTENSION, BENIGN- his blood pressure is well controlled, electrolytes and renal function are stable. -     Comprehensive metabolic panel; Future  Paranoid schizophrenia ([place])- he is doing well on Seroquel and trazodone, will continue, will also refer him to a new therapist. -     Ambulatory referral to Psychology  Syphili, latent- his RPR titer remains very low at 1:2, this  is very reassuring. -     RPR; Future -     HIV antibody (with reflex)  High risk sexual behavior- I don't think Truvada is indicated anymore since his risk for HIV acquisition is extremely low. -     HIV antibody (with reflex)   I have discontinued [name] and varenicline. I am also having him maintain his mometasone-formoterol, QUEtiapine, LIVALO, ciclopirox, Vilazodone HCl, albuterol, PARoxetine, esomeprazole, cetirizine, HYDROcodone-acetaminophen, traZODone, and carvedilol.  No orders of the defined types were placed in this encounter.    Follow-up: Return in about 6 months (around 11/29/2016).  Scarlette Calico, MD

## 2016-06-02 ENCOUNTER — Encounter: Payer: Self-pay | Admitting: Internal Medicine

## 2016-06-02 LAB — RPR: RPR: REACTIVE — AB

## 2016-06-02 LAB — FLUORESCENT TREPONEMAL AB(FTA)-IGG-BLD: FLUORESCENT TREPONEMAL ABS: REACTIVE — AB

## 2016-06-02 LAB — RPR TITER: RPR Titer: 1:2 {titer}

## 2016-06-02 MED ORDER — UMECLIDINIUM-VILANTEROL 62.5-25 MCG/INH IN AEPB: 1.0000 | INHALATION_SPRAY | Freq: Every day | RESPIRATORY_TRACT | 11 refills | Status: DC

## 2016-06-02 MED ORDER — ALBUTEROL SULFATE HFA 108 (90 BASE) MCG/ACT IN AERS: 1.0000 | INHALATION_SPRAY | Freq: Four times a day (QID) | RESPIRATORY_TRACT | 11 refills | Status: DC | PRN

## 2016-06-02 NOTE — Addendum Note (Signed)
Addended by: [name] on: 06/02/2016 02:03 PM   Modules accepted: Orders

## 2016-06-03 DIAGNOSIS — B1910 Unspecified viral hepatitis B without hepatic coma: Secondary | ICD-10-CM | POA: Diagnosis not present

## 2016-06-03 DIAGNOSIS — F209 Schizophrenia, unspecified: Secondary | ICD-10-CM | POA: Diagnosis not present

## 2016-06-03 DIAGNOSIS — Z4682 Encounter for fitting and adjustment of non-vascular catheter: Secondary | ICD-10-CM | POA: Diagnosis not present

## 2016-06-03 DIAGNOSIS — E119 Type 2 diabetes mellitus without complications: Secondary | ICD-10-CM | POA: Diagnosis not present

## 2016-06-03 DIAGNOSIS — I739 Peripheral vascular disease, unspecified: Secondary | ICD-10-CM | POA: Diagnosis not present

## 2016-06-03 DIAGNOSIS — J45909 Unspecified asthma, uncomplicated: Secondary | ICD-10-CM | POA: Diagnosis not present

## 2016-06-03 DIAGNOSIS — C3411 Malignant neoplasm of upper lobe, right bronchus or lung: Secondary | ICD-10-CM | POA: Diagnosis not present

## 2016-06-03 DIAGNOSIS — F1010 Alcohol abuse, uncomplicated: Secondary | ICD-10-CM | POA: Diagnosis not present

## 2016-06-03 DIAGNOSIS — K219 Gastro-esophageal reflux disease without esophagitis: Secondary | ICD-10-CM | POA: Diagnosis not present

## 2016-06-03 DIAGNOSIS — Z483 Aftercare following surgery for neoplasm: Secondary | ICD-10-CM | POA: Diagnosis not present

## 2016-06-03 DIAGNOSIS — Z720 Tobacco use: Secondary | ICD-10-CM | POA: Diagnosis not present

## 2016-06-03 DIAGNOSIS — E785 Hyperlipidemia, unspecified: Secondary | ICD-10-CM | POA: Diagnosis not present

## 2016-06-17 ENCOUNTER — Encounter (HOSPITAL_COMMUNITY): Payer: Self-pay | Admitting: Emergency Medicine

## 2016-06-17 ENCOUNTER — Emergency Department (HOSPITAL_COMMUNITY)
Admission: EM | Admit: 2016-06-17 | Discharge: 2016-06-17 | Disposition: A | Discharge status: Home / Self Care | Payer: Medicare Other | Attending: Dermatology | Admitting: Dermatology

## 2016-06-17 DIAGNOSIS — Z7951 Long term (current) use of inhaled steroids: Secondary | ICD-10-CM | POA: Diagnosis not present

## 2016-06-17 DIAGNOSIS — Z79899 Other long term (current) drug therapy: Secondary | ICD-10-CM | POA: Insufficient documentation

## 2016-06-17 DIAGNOSIS — I509 Heart failure, unspecified: Secondary | ICD-10-CM | POA: Diagnosis not present

## 2016-06-17 DIAGNOSIS — R0602 Shortness of breath: Secondary | ICD-10-CM | POA: Insufficient documentation

## 2016-06-17 DIAGNOSIS — E119 Type 2 diabetes mellitus without complications: Secondary | ICD-10-CM | POA: Diagnosis not present

## 2016-06-17 DIAGNOSIS — F17210 Nicotine dependence, cigarettes, uncomplicated: Secondary | ICD-10-CM | POA: Insufficient documentation

## 2016-06-17 DIAGNOSIS — I110 Hypertensive heart disease with heart failure: Secondary | ICD-10-CM | POA: Diagnosis not present

## 2016-06-17 DIAGNOSIS — R069 Unspecified abnormalities of breathing: Secondary | ICD-10-CM | POA: Diagnosis not present

## 2016-06-17 DIAGNOSIS — Z85118 Personal history of other malignant neoplasm of bronchus and lung: Secondary | ICD-10-CM | POA: Diagnosis not present

## 2016-06-17 DIAGNOSIS — Z5321 Procedure and treatment not carried out due to patient leaving prior to being seen by health care provider: Secondary | ICD-10-CM | POA: Insufficient documentation

## 2016-06-17 NOTE — ED Notes (Signed)
Bed: RESB Expected date:  Expected time:  Means of arrival:  Comments: EMS- 55yo M, [place] Pt, weakness/near syncope/SOB

## 2016-06-17 NOTE — ED Triage Notes (Signed)
[place] [facility] [place] episode where patient got weak and SOB.  Patient states has lung cancer and on O2 3L continuously.  Patient came in with EMS having a neb treatment. Patient begging for cigarette and making comments that he will leave.

## 2016-06-17 NOTE — ED Notes (Signed)
Patient comes out of the room dressed and demanding to leave, because he states "i was called a liar and I am not no liar! 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  Asked patient if he would not stay little longer to be seen by one of our doctors.  Patient states, "I am calling [facility] because that lady who is supposedly in charge here called me a liar!".   I apologized again to patient and asked patient if out charged nurse did not come an apologize to him, as he replied "well, yeah, but she should have never said that I smoked to begin with!"  Patient still adamant to leave as we are walking down hallway towards main ED lobby.

## 2016-06-17 NOTE — ED Notes (Signed)
Bathroom smelled strongly of cigarette smoke after the patient was finished, however patient denies smoking in the the bathroom.

## 2016-06-17 NOTE — ED Notes (Signed)
Patient asking multiple times if he can go smoke a cigarette outside. Patient told that smoking is not permitted several times. Now pt. requesting to go to the bathroom.

## 2016-06-17 NOTE — ED Notes (Signed)
Pt left red and black blue tooth device in room.  Went outside to check for pt and return device but pt was not found It was suggested that pt might have gone to the bus stop  Blue tooth device given to [employer] and asked to check bus stops closest to hospital for pt Security returned stating that he was able to locate pt at bus stop and return device

## 2016-06-17 NOTE — ED Notes (Addendum)
Due to suspicions of Pt smoking in the restroom, this Charge RN informed the Pt that if he smokes in our restroom then, we will have him escorted out of the building and further explained the fire risks.  Pt asked to turnover cigarettes and lighter.  Pt voluntarily gave them to this Probation officer.  Pt was adamant that he did not smoke in the restroom.  This Probation officer informed him that no one was accusing him of smoking in the restroom and we believed him.  However, we were concerned about the strong smell.  Pt reports that he is a heavy smoker.  This Probation officer mentioned that the smell may be from his clothing and Pt agreed.  Pt continually stated "I don't like being called a liar."  This writer attempted to reassure him that no one was calling him a liar and apologized that he felt that way.  Pt verbalized acceptance of this writer's apologies for the misunderstanding.

## 2016-06-19 IMAGING — DX DG CHEST 2V
2 series · 2 of 2 positions shown · non-contrast
Comparison: 09/15/2014 and earlier.

CLINICAL DATA: 54-year-old male with nonproductive cough and
shortness of breath for 1 month following COPD medication change.
Initial encounter.

EXAM:
CHEST  2 VIEW

[chest pa]
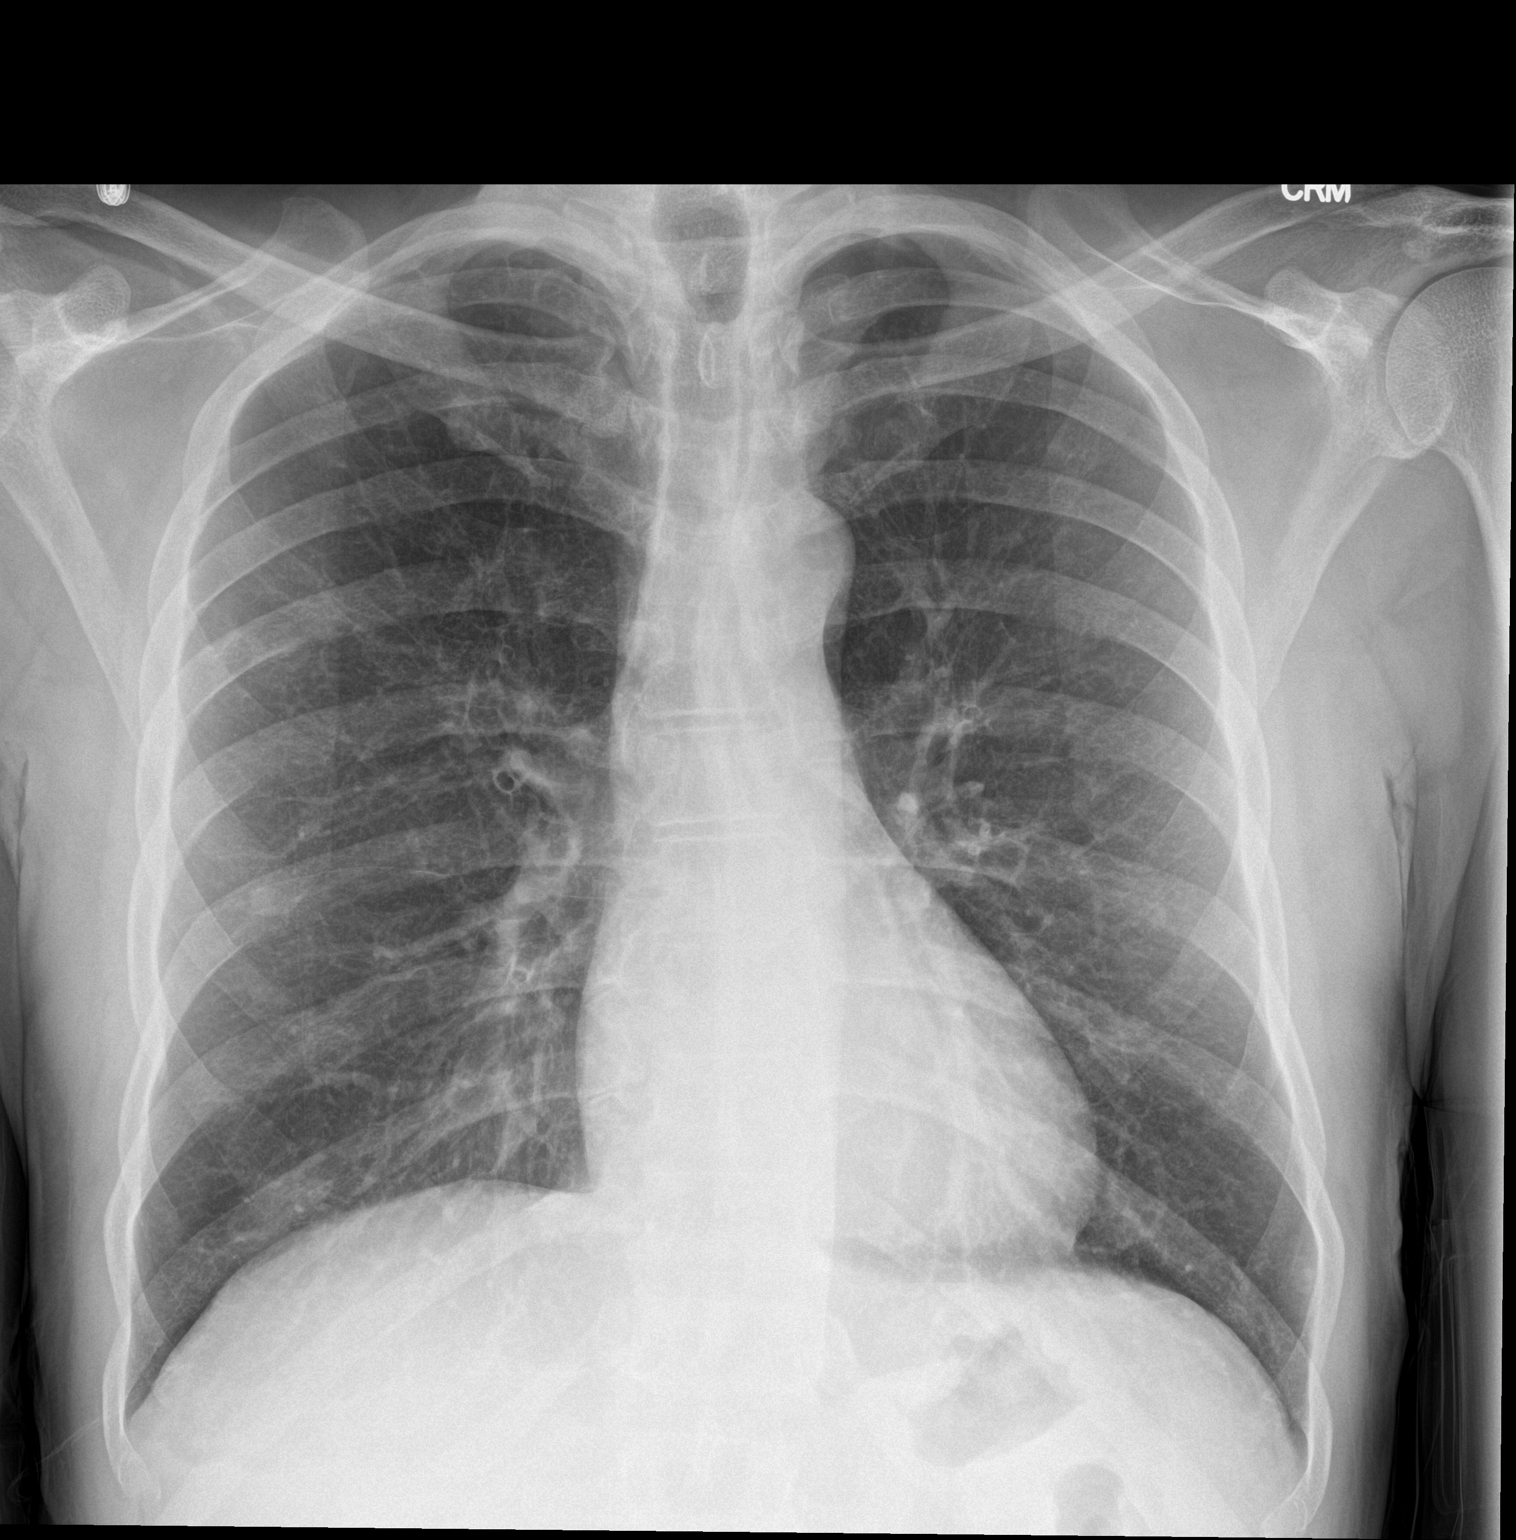

[chest lat]
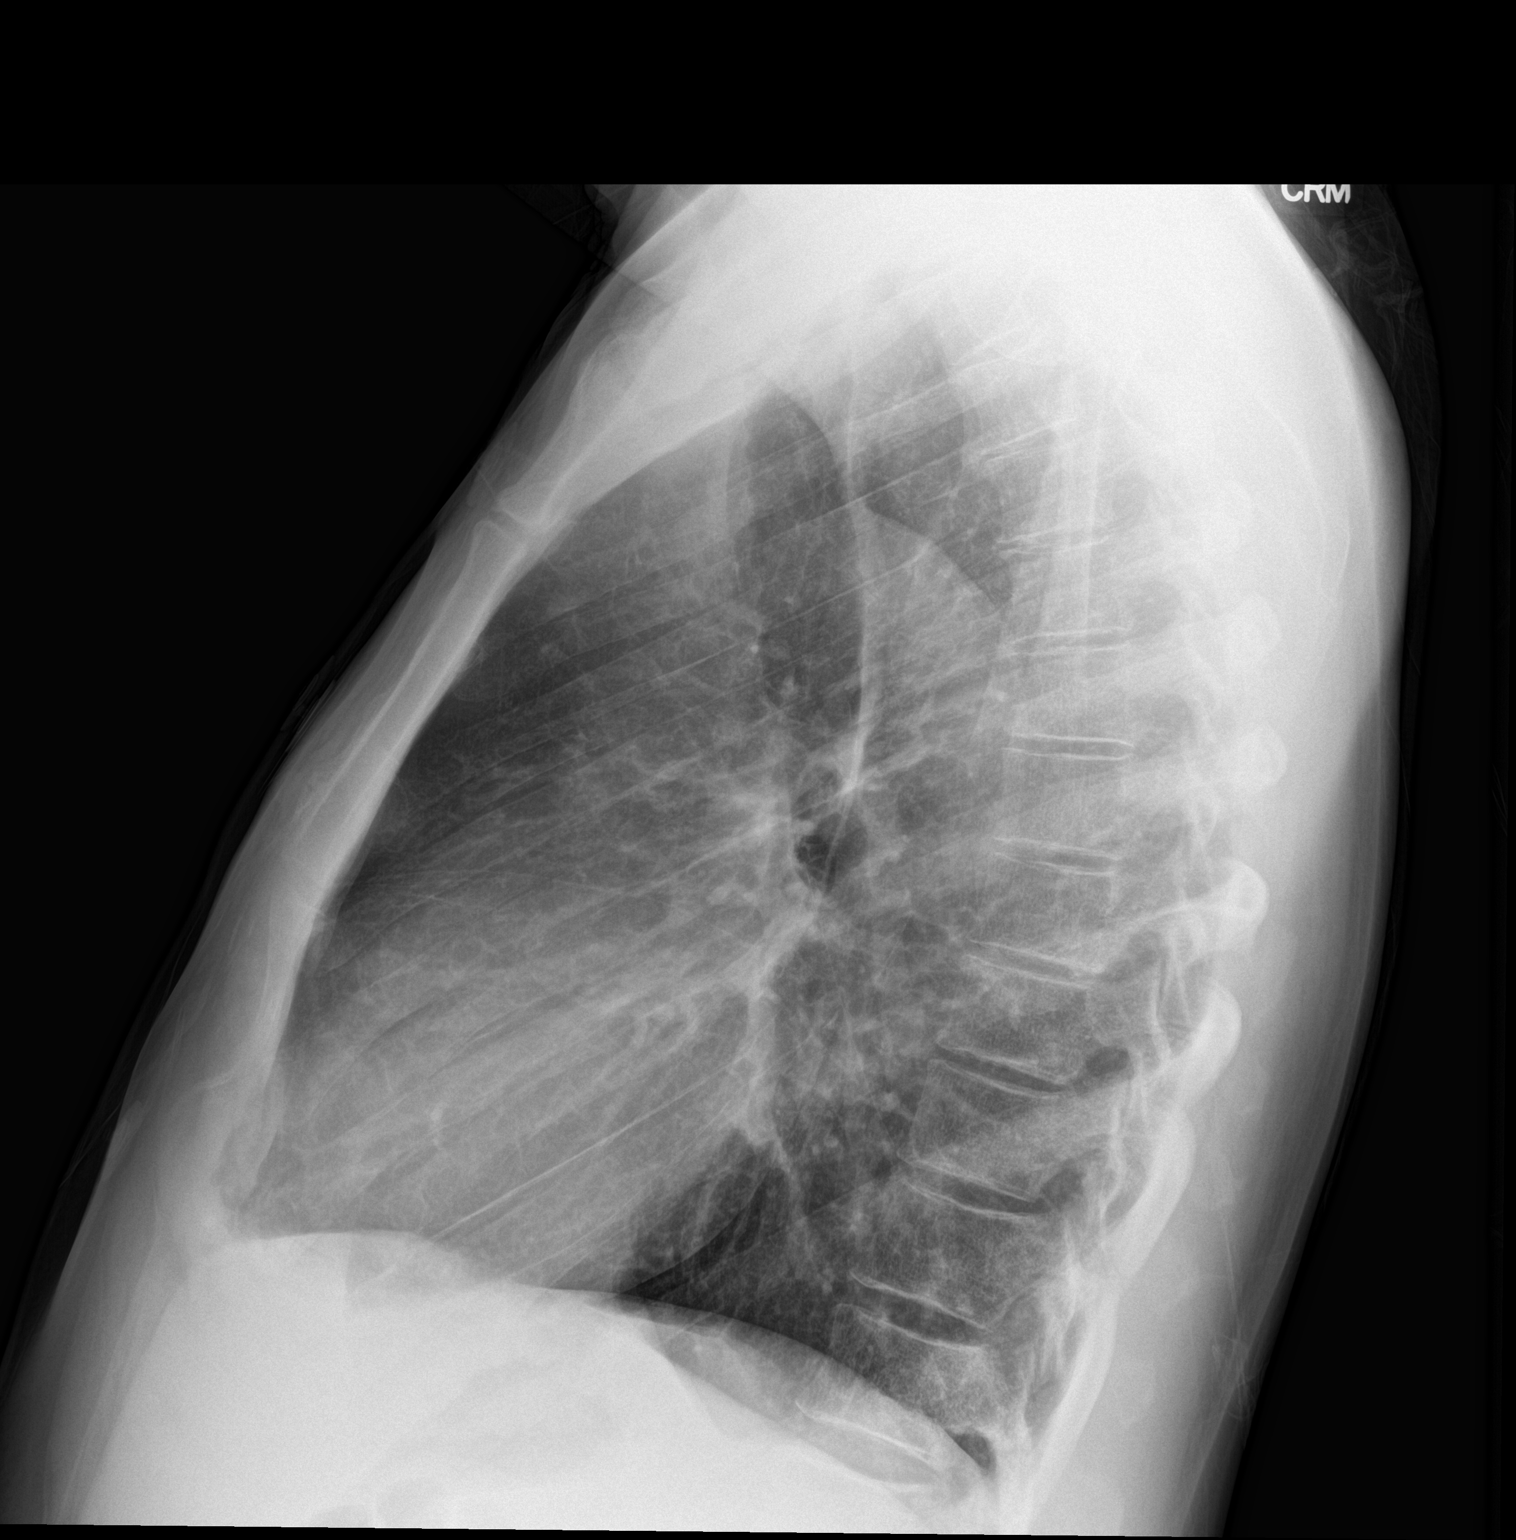

[2 of 2 positions shown; findings below may reference images not displayed]

FINDINGS: Stable lung volumes at the upper limits of normal. Normal cardiac
size and mediastinal contours. Visualized tracheal air column is
within normal limits. No pneumothorax, pulmonary edema, pleural
effusion or confluent pulmonary opacity. No acute osseous
abnormality identified.
IMPRESSION: No acute cardiopulmonary abnormality.

## 2016-06-20 ENCOUNTER — Encounter: Payer: Self-pay | Admitting: Internal Medicine

## 2016-06-20 DIAGNOSIS — M545 Low back pain, unspecified: Secondary | ICD-10-CM

## 2016-06-20 DIAGNOSIS — M170 Bilateral primary osteoarthritis of knee: Secondary | ICD-10-CM

## 2016-06-20 DIAGNOSIS — G8929 Other chronic pain: Secondary | ICD-10-CM

## 2016-06-20 MED ORDER — HYDROCODONE-ACETAMINOPHEN 10-325 MG PO TABS: 1.0000 | ORAL_TABLET | Freq: Three times a day (TID) | ORAL | 0 refills | Status: DC | PRN

## 2016-06-20 NOTE — Telephone Encounter (Signed)
Pt rq rf for hydrocodone. Pended for your review.

## 2016-06-20 NOTE — Progress Notes (Signed)
Pt called back about this. Thanks.

## 2016-06-21 NOTE — Telephone Encounter (Signed)
[name] sent pt a message yesterday.  Will you call him and let him know it is ready.

## 2016-06-21 NOTE — Telephone Encounter (Signed)
Pt came by yesterday Monday 9/25 and picked up prescription. Thanks.

## 2016-06-27 ENCOUNTER — Other Ambulatory Visit: Payer: Self-pay | Admitting: Internal Medicine

## 2016-07-04 ENCOUNTER — Telehealth: Payer: Self-pay

## 2016-07-04 NOTE — Telephone Encounter (Signed)
Home Health Cert/Plan of Care received (05/10/2016 - 07/08/2016) and placed on MD's desk for signature

## 2016-07-04 NOTE — Telephone Encounter (Signed)
Paperwork signed, faxed, copy sent to scan 

## 2016-07-12 ENCOUNTER — Other Ambulatory Visit: Payer: Self-pay | Admitting: Internal Medicine

## 2016-07-13 ENCOUNTER — Ambulatory Visit (INDEPENDENT_AMBULATORY_CARE_PROVIDER_SITE_OTHER): Payer: 59 | Admitting: Psychology

## 2016-07-13 DIAGNOSIS — F209 Schizophrenia, unspecified: Secondary | ICD-10-CM | POA: Diagnosis not present

## 2016-07-19 ENCOUNTER — Encounter: Payer: Self-pay | Admitting: Internal Medicine

## 2016-07-19 DIAGNOSIS — C3411 Malignant neoplasm of upper lobe, right bronchus or lung: Secondary | ICD-10-CM | POA: Diagnosis not present

## 2016-07-19 DIAGNOSIS — J45909 Unspecified asthma, uncomplicated: Secondary | ICD-10-CM | POA: Diagnosis not present

## 2016-07-19 DIAGNOSIS — Z4682 Encounter for fitting and adjustment of non-vascular catheter: Secondary | ICD-10-CM | POA: Diagnosis not present

## 2016-07-22 ENCOUNTER — Encounter: Payer: Self-pay | Admitting: Internal Medicine

## 2016-07-22 DIAGNOSIS — Z79891 Long term (current) use of opiate analgesic: Secondary | ICD-10-CM | POA: Diagnosis not present

## 2016-07-25 ENCOUNTER — Other Ambulatory Visit: Payer: Self-pay | Admitting: Internal Medicine

## 2016-07-25 ENCOUNTER — Telehealth: Payer: Self-pay | Admitting: Internal Medicine

## 2016-07-25 ENCOUNTER — Encounter: Payer: Self-pay | Admitting: Internal Medicine

## 2016-07-25 DIAGNOSIS — M545 Low back pain, unspecified: Secondary | ICD-10-CM

## 2016-07-25 DIAGNOSIS — G8929 Other chronic pain: Secondary | ICD-10-CM

## 2016-07-25 DIAGNOSIS — M170 Bilateral primary osteoarthritis of knee: Secondary | ICD-10-CM

## 2016-07-25 MED ORDER — HYDROCODONE-ACETAMINOPHEN 10-325 MG PO TABS: 1.0000 | ORAL_TABLET | Freq: Three times a day (TID) | ORAL | 0 refills | Status: DC | PRN

## 2016-07-25 NOTE — Telephone Encounter (Signed)
Patient walked in and picked up hydrocodone RX

## 2016-07-27 ENCOUNTER — Ambulatory Visit: Payer: Self-pay | Admitting: Psychology

## 2016-08-02 ENCOUNTER — Other Ambulatory Visit: Payer: Self-pay | Admitting: Medical Oncology

## 2016-08-02 DIAGNOSIS — C3491 Malignant neoplasm of unspecified part of right bronchus or lung: Secondary | ICD-10-CM

## 2016-08-03 ENCOUNTER — Ambulatory Visit: Payer: Self-pay | Admitting: Psychology

## 2016-08-03 ENCOUNTER — Telehealth: Payer: Self-pay | Admitting: Medical Oncology

## 2016-08-03 ENCOUNTER — Other Ambulatory Visit: Payer: Self-pay

## 2016-08-03 ENCOUNTER — Ambulatory Visit (HOSPITAL_COMMUNITY): Admission: RE | Admit: 2016-08-03 | Payer: Medicare Other | Source: Ambulatory Visit

## 2016-08-03 NOTE — Telephone Encounter (Signed)
I left a message for pt to r/s missed scan prior to appt with [facility] on Monday or to call our office to r/s f/u

## 2016-08-04 ENCOUNTER — Other Ambulatory Visit: Payer: Self-pay | Admitting: Emergency Medicine

## 2016-08-04 DIAGNOSIS — C3491 Malignant neoplasm of unspecified part of right bronchus or lung: Secondary | ICD-10-CM

## 2016-08-05 ENCOUNTER — Ambulatory Visit (HOSPITAL_COMMUNITY)
Admission: RE | Admit: 2016-08-05 | Discharge: 2016-08-05 | Disposition: A | Discharge status: Home / Self Care | Payer: Medicare Other | Source: Ambulatory Visit | Attending: Internal Medicine | Admitting: Internal Medicine

## 2016-08-05 DIAGNOSIS — I2510 Atherosclerotic heart disease of native coronary artery without angina pectoris: Secondary | ICD-10-CM | POA: Insufficient documentation

## 2016-08-05 DIAGNOSIS — C3491 Malignant neoplasm of unspecified part of right bronchus or lung: Secondary | ICD-10-CM | POA: Diagnosis not present

## 2016-08-05 DIAGNOSIS — Z9889 Other specified postprocedural states: Secondary | ICD-10-CM | POA: Diagnosis not present

## 2016-08-05 DIAGNOSIS — K760 Fatty (change of) liver, not elsewhere classified: Secondary | ICD-10-CM | POA: Diagnosis not present

## 2016-08-05 DIAGNOSIS — R590 Localized enlarged lymph nodes: Secondary | ICD-10-CM | POA: Insufficient documentation

## 2016-08-05 DIAGNOSIS — J439 Emphysema, unspecified: Secondary | ICD-10-CM | POA: Insufficient documentation

## 2016-08-05 DIAGNOSIS — C3411 Malignant neoplasm of upper lobe, right bronchus or lung: Secondary | ICD-10-CM | POA: Diagnosis not present

## 2016-08-05 DIAGNOSIS — F17210 Nicotine dependence, cigarettes, uncomplicated: Secondary | ICD-10-CM | POA: Diagnosis not present

## 2016-08-05 LAB — POCT I-STAT CREATININE: Creatinine, Ser: 0.8 mg/dL (ref 0.61–1.24)

## 2016-08-05 MED ORDER — IOPAMIDOL (ISOVUE-300) INJECTION 61%
75.0000 mL | Freq: Once | INTRAVENOUS | Status: AC | PRN
  Administered 2016-08-05: 75 mL via INTRAVENOUS

## 2016-08-08 ENCOUNTER — Ambulatory Visit: Payer: Self-pay | Admitting: Internal Medicine

## 2016-08-08 ENCOUNTER — Other Ambulatory Visit: Payer: Self-pay | Admitting: Internal Medicine

## 2016-08-08 DIAGNOSIS — F200 Paranoid schizophrenia: Secondary | ICD-10-CM

## 2016-08-09 ENCOUNTER — Other Ambulatory Visit: Payer: Self-pay

## 2016-08-10 ENCOUNTER — Ambulatory Visit: Payer: Self-pay | Admitting: Psychology

## 2016-08-12 ENCOUNTER — Other Ambulatory Visit: Payer: Self-pay

## 2016-08-12 ENCOUNTER — Ambulatory Visit: Payer: Self-pay | Admitting: Internal Medicine

## 2016-08-21 ENCOUNTER — Encounter: Payer: Self-pay | Admitting: Internal Medicine

## 2016-08-22 ENCOUNTER — Other Ambulatory Visit: Payer: Self-pay | Admitting: Internal Medicine

## 2016-08-22 ENCOUNTER — Encounter: Payer: Self-pay | Admitting: Internal Medicine

## 2016-08-22 DIAGNOSIS — G8929 Other chronic pain: Secondary | ICD-10-CM

## 2016-08-22 DIAGNOSIS — M170 Bilateral primary osteoarthritis of knee: Secondary | ICD-10-CM

## 2016-08-22 DIAGNOSIS — M545 Low back pain, unspecified: Secondary | ICD-10-CM

## 2016-08-22 DIAGNOSIS — Z79891 Long term (current) use of opiate analgesic: Secondary | ICD-10-CM | POA: Diagnosis not present

## 2016-08-22 MED ORDER — HYDROCODONE-ACETAMINOPHEN 10-325 MG PO TABS: 1.0000 | ORAL_TABLET | Freq: Three times a day (TID) | ORAL | 0 refills | Status: DC | PRN

## 2016-08-23 ENCOUNTER — Telehealth: Payer: Self-pay

## 2016-08-23 NOTE — Telephone Encounter (Signed)
Pt stated he has been trying for >1 week to r/s his missed appt of 11/17. I was going to place inbasket for schedulers for next available. That appears to be in late December, and I did not know if this was appropriate. Please address.

## 2016-08-24 NOTE — Telephone Encounter (Signed)
Schedule request sent for next available.

## 2016-08-26 ENCOUNTER — Telehealth: Payer: Self-pay | Admitting: Internal Medicine

## 2016-08-26 NOTE — Telephone Encounter (Signed)
sw pt to confirm 12/6 appt date/time per LOS

## 2016-08-31 ENCOUNTER — Ambulatory Visit: Payer: Self-pay | Admitting: Internal Medicine

## 2016-09-09 ENCOUNTER — Encounter (HOSPITAL_COMMUNITY): Payer: Self-pay | Admitting: Emergency Medicine

## 2016-09-09 ENCOUNTER — Emergency Department (HOSPITAL_COMMUNITY): Payer: Medicare Other

## 2016-09-09 ENCOUNTER — Emergency Department (HOSPITAL_COMMUNITY)
Admission: EM | Admit: 2016-09-09 | Discharge: 2016-09-09 | Disposition: A | Discharge status: Home / Self Care | Payer: Medicare Other | Attending: Emergency Medicine | Admitting: Emergency Medicine

## 2016-09-09 DIAGNOSIS — Z955 Presence of coronary angioplasty implant and graft: Secondary | ICD-10-CM | POA: Diagnosis not present

## 2016-09-09 DIAGNOSIS — Z79899 Other long term (current) drug therapy: Secondary | ICD-10-CM | POA: Insufficient documentation

## 2016-09-09 DIAGNOSIS — I110 Hypertensive heart disease with heart failure: Secondary | ICD-10-CM | POA: Diagnosis not present

## 2016-09-09 DIAGNOSIS — E119 Type 2 diabetes mellitus without complications: Secondary | ICD-10-CM | POA: Insufficient documentation

## 2016-09-09 DIAGNOSIS — I509 Heart failure, unspecified: Secondary | ICD-10-CM | POA: Insufficient documentation

## 2016-09-09 DIAGNOSIS — J449 Chronic obstructive pulmonary disease, unspecified: Secondary | ICD-10-CM | POA: Diagnosis not present

## 2016-09-09 DIAGNOSIS — R05 Cough: Secondary | ICD-10-CM | POA: Diagnosis not present

## 2016-09-09 DIAGNOSIS — R079 Chest pain, unspecified: Secondary | ICD-10-CM | POA: Diagnosis not present

## 2016-09-09 DIAGNOSIS — F17210 Nicotine dependence, cigarettes, uncomplicated: Secondary | ICD-10-CM | POA: Diagnosis not present

## 2016-09-09 DIAGNOSIS — F10120 Alcohol abuse with intoxication, uncomplicated: Secondary | ICD-10-CM | POA: Diagnosis not present

## 2016-09-09 DIAGNOSIS — Z85118 Personal history of other malignant neoplasm of bronchus and lung: Secondary | ICD-10-CM | POA: Insufficient documentation

## 2016-09-09 DIAGNOSIS — R0902 Hypoxemia: Secondary | ICD-10-CM | POA: Diagnosis not present

## 2016-09-09 DIAGNOSIS — F10920 Alcohol use, unspecified with intoxication, uncomplicated: Secondary | ICD-10-CM

## 2016-09-09 DIAGNOSIS — R0789 Other chest pain: Secondary | ICD-10-CM | POA: Diagnosis not present

## 2016-09-09 DIAGNOSIS — Y908 Blood alcohol level of 240 mg/100 ml or more: Secondary | ICD-10-CM | POA: Diagnosis not present

## 2016-09-09 DIAGNOSIS — R0602 Shortness of breath: Secondary | ICD-10-CM | POA: Diagnosis not present

## 2016-09-09 DIAGNOSIS — F10129 Alcohol abuse with intoxication, unspecified: Secondary | ICD-10-CM | POA: Diagnosis present

## 2016-09-09 LAB — RAPID URINE DRUG SCREEN, HOSP PERFORMED
Amphetamines: NOT DETECTED
Barbiturates: NOT DETECTED
Benzodiazepines: NOT DETECTED
COCAINE: NOT DETECTED
OPIATES: NOT DETECTED
Tetrahydrocannabinol: NOT DETECTED

## 2016-09-09 LAB — TROPONIN I

## 2016-09-09 LAB — CBC WITH DIFFERENTIAL/PLATELET
BASOS ABS: 0 10*3/uL (ref 0.0–0.1)
Basophils Relative: 1 %
EOS PCT: 1 %
Eosinophils Absolute: 0.1 10*3/uL (ref 0.0–0.7)
HCT: 47.1 % (ref 39.0–52.0)
Hemoglobin: 16.3 g/dL (ref 13.0–17.0)
LYMPHS PCT: 44 %
Lymphs Abs: 2.9 10*3/uL (ref 0.7–4.0)
MCH: 29.2 pg (ref 26.0–34.0)
MCHC: 34.6 g/dL (ref 30.0–36.0)
MCV: 84.4 fL (ref 78.0–100.0)
MONO ABS: 0.5 10*3/uL (ref 0.1–1.0)
Monocytes Relative: 8 %
Neutro Abs: 3.1 10*3/uL (ref 1.7–7.7)
Neutrophils Relative %: 46 %
PLATELETS: 166 10*3/uL (ref 150–400)
RBC: 5.58 MIL/uL (ref 4.22–5.81)
RDW: 17.7 % — AB (ref 11.5–15.5)
WBC: 6.5 10*3/uL (ref 4.0–10.5)

## 2016-09-09 LAB — COMPREHENSIVE METABOLIC PANEL
ALT: 36 U/L (ref 17–63)
ANION GAP: 11 (ref 5–15)
AST: 34 U/L (ref 15–41)
Albumin: 4 g/dL (ref 3.5–5.0)
Alkaline Phosphatase: 70 U/L (ref 38–126)
BUN: 5 mg/dL — ABNORMAL LOW (ref 6–20)
CO2: 24 mmol/L (ref 22–32)
Calcium: 8.5 mg/dL — ABNORMAL LOW (ref 8.9–10.3)
Chloride: 108 mmol/L (ref 101–111)
Creatinine, Ser: 0.78 mg/dL (ref 0.61–1.24)
Glucose, Bld: 116 mg/dL — ABNORMAL HIGH (ref 65–99)
POTASSIUM: 4.1 mmol/L (ref 3.5–5.1)
Sodium: 143 mmol/L (ref 135–145)
Total Bilirubin: 0.3 mg/dL (ref 0.3–1.2)
Total Protein: 7.7 g/dL (ref 6.5–8.1)

## 2016-09-09 LAB — ETHANOL: Alcohol, Ethyl (B): 338 mg/dL (ref <5)

## 2016-09-09 LAB — D-DIMER, QUANTITATIVE (NOT AT ARMC): D DIMER QUANT: 1.26 ug{FEU}/mL — AB (ref 0.00–0.50)

## 2016-09-09 MED ORDER — IOPAMIDOL (ISOVUE-370) INJECTION 76%
INTRAVENOUS | Status: AC
  Administered 2016-09-09: 100 mL
  Filled 2016-09-09: qty 100

## 2016-09-09 MED ORDER — ALBUTEROL SULFATE (2.5 MG/3ML) 0.083% IN NEBU
5.0000 mg | INHALATION_SOLUTION | Freq: Once | RESPIRATORY_TRACT | Status: AC
  Administered 2016-09-09: 5 mg via RESPIRATORY_TRACT
  Filled 2016-09-09: qty 6

## 2016-09-09 MED ORDER — IPRATROPIUM BROMIDE 0.02 % IN SOLN
0.5000 mg | Freq: Once | RESPIRATORY_TRACT | Status: AC
  Administered 2016-09-09: 0.5 mg via RESPIRATORY_TRACT
  Filled 2016-09-09: qty 2.5

## 2016-09-09 NOTE — ED Notes (Signed)
Pt paranoid that staff is talking about him and laughing at him. Pt continues to use inappropriate language with staff.

## 2016-09-09 NOTE — ED Triage Notes (Signed)
Per EMS-intoxicated-states he has pain when he breaths-history of lung cancer-not being treated-patient yelling out

## 2016-09-09 NOTE — ED Notes (Signed)
Bed: VQ25 Expected date:  Expected time:  Means of arrival:  Comments: EMS-pain
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
## 2016-09-09 NOTE — ED Notes (Signed)
Patient yelling out to staff/visitors walking by-called Radiology a derogatory name-attempted to redirect patient-called security to discuss appropriate behavior that is tolerated in ED with patient

## 2016-09-09 NOTE — ED Notes (Signed)
Patient requested to go outside and smoke

## 2016-09-27 ENCOUNTER — Encounter: Payer: Self-pay | Admitting: Thoracic Surgery (Cardiothoracic Vascular Surgery)

## 2016-10-04 ENCOUNTER — Encounter: Payer: Self-pay | Admitting: Thoracic Surgery (Cardiothoracic Vascular Surgery)

## 2016-10-06 ENCOUNTER — Ambulatory Visit: Payer: Self-pay | Admitting: Internal Medicine

## 2016-10-09 ENCOUNTER — Emergency Department (HOSPITAL_COMMUNITY)
Admission: EM | Admit: 2016-10-09 | Discharge: 2016-10-10 | Disposition: A | Discharge status: Home / Self Care | Payer: Medicare Other | Attending: Emergency Medicine | Admitting: Emergency Medicine

## 2016-10-09 ENCOUNTER — Emergency Department (HOSPITAL_COMMUNITY): Payer: Medicare Other

## 2016-10-09 ENCOUNTER — Encounter (HOSPITAL_COMMUNITY): Payer: Self-pay

## 2016-10-09 DIAGNOSIS — Z9889 Other specified postprocedural states: Secondary | ICD-10-CM | POA: Diagnosis not present

## 2016-10-09 DIAGNOSIS — I509 Heart failure, unspecified: Secondary | ICD-10-CM | POA: Diagnosis not present

## 2016-10-09 DIAGNOSIS — F200 Paranoid schizophrenia: Secondary | ICD-10-CM | POA: Insufficient documentation

## 2016-10-09 DIAGNOSIS — Z85118 Personal history of other malignant neoplasm of bronchus and lung: Secondary | ICD-10-CM | POA: Insufficient documentation

## 2016-10-09 DIAGNOSIS — I110 Hypertensive heart disease with heart failure: Secondary | ICD-10-CM | POA: Insufficient documentation

## 2016-10-09 DIAGNOSIS — E119 Type 2 diabetes mellitus without complications: Secondary | ICD-10-CM | POA: Diagnosis not present

## 2016-10-09 DIAGNOSIS — J449 Chronic obstructive pulmonary disease, unspecified: Secondary | ICD-10-CM | POA: Diagnosis not present

## 2016-10-09 DIAGNOSIS — Z823 Family history of stroke: Secondary | ICD-10-CM | POA: Diagnosis not present

## 2016-10-09 DIAGNOSIS — F22 Delusional disorders: Secondary | ICD-10-CM | POA: Diagnosis present

## 2016-10-09 DIAGNOSIS — Z8249 Family history of ischemic heart disease and other diseases of the circulatory system: Secondary | ICD-10-CM | POA: Diagnosis not present

## 2016-10-09 DIAGNOSIS — Z955 Presence of coronary angioplasty implant and graft: Secondary | ICD-10-CM | POA: Insufficient documentation

## 2016-10-09 DIAGNOSIS — Z79899 Other long term (current) drug therapy: Secondary | ICD-10-CM | POA: Insufficient documentation

## 2016-10-09 DIAGNOSIS — F17210 Nicotine dependence, cigarettes, uncomplicated: Secondary | ICD-10-CM | POA: Diagnosis not present

## 2016-10-09 DIAGNOSIS — R918 Other nonspecific abnormal finding of lung field: Secondary | ICD-10-CM | POA: Diagnosis not present

## 2016-10-09 LAB — BASIC METABOLIC PANEL
ANION GAP: 11 (ref 5–15)
BUN: 6 mg/dL (ref 6–20)
CO2: 24 mmol/L (ref 22–32)
Calcium: 8.5 mg/dL — ABNORMAL LOW (ref 8.9–10.3)
Chloride: 106 mmol/L (ref 101–111)
Creatinine, Ser: 1.02 mg/dL (ref 0.61–1.24)
GFR calc non Af Amer: >60 mL/min (ref >60)
Glucose, Bld: 110 mg/dL — ABNORMAL HIGH (ref 65–99)
POTASSIUM: 3.8 mmol/L (ref 3.5–5.1)
SODIUM: 141 mmol/L (ref 135–145)

## 2016-10-09 LAB — I-STAT CHEM 8, ED
BUN: 4 mg/dL — ABNORMAL LOW (ref 6–20)
Calcium, Ion: 1.07 mmol/L — ABNORMAL LOW (ref 1.15–1.40)
Chloride: 105 mmol/L (ref 101–111)
Creatinine, Ser: 1.2 mg/dL (ref 0.61–1.24)
Glucose, Bld: 109 mg/dL — ABNORMAL HIGH (ref 65–99)
HCT: 45 % (ref 39.0–52.0)
HEMOGLOBIN: 15.3 g/dL (ref 13.0–17.0)
POTASSIUM: 3.7 mmol/L (ref 3.5–5.1)
Sodium: 142 mmol/L (ref 135–145)
TCO2: 23 mmol/L (ref 0–100)

## 2016-10-09 LAB — CBC WITH DIFFERENTIAL/PLATELET
BASOS ABS: 0 10*3/uL (ref 0.0–0.1)
Basophils Relative: 1 %
EOS ABS: 0.1 10*3/uL (ref 0.0–0.7)
Eosinophils Relative: 2 %
HCT: 41.7 % (ref 39.0–52.0)
Hemoglobin: 14.6 g/dL (ref 13.0–17.0)
Lymphocytes Relative: 46 %
Lymphs Abs: 3 10*3/uL (ref 0.7–4.0)
MCH: 30 pg (ref 26.0–34.0)
MCHC: 35 g/dL (ref 30.0–36.0)
MCV: 85.6 fL (ref 78.0–100.0)
Monocytes Absolute: 0.7 10*3/uL (ref 0.1–1.0)
Monocytes Relative: 11 %
Neutro Abs: 2.6 10*3/uL (ref 1.7–7.7)
Neutrophils Relative %: 40 %
PLATELETS: 145 10*3/uL — AB (ref 150–400)
RBC: 4.87 MIL/uL (ref 4.22–5.81)
RDW: 17 % — ABNORMAL HIGH (ref 11.5–15.5)
WBC: 6.5 10*3/uL (ref 4.0–10.5)

## 2016-10-09 LAB — RAPID URINE DRUG SCREEN, HOSP PERFORMED
Amphetamines: NOT DETECTED
BARBITURATES: NOT DETECTED
BENZODIAZEPINES: NOT DETECTED
Cocaine: NOT DETECTED
Opiates: NOT DETECTED
Tetrahydrocannabinol: NOT DETECTED

## 2016-10-09 LAB — URINALYSIS, ROUTINE W REFLEX MICROSCOPIC
BILIRUBIN URINE: NEGATIVE
GLUCOSE, UA: NEGATIVE mg/dL
KETONES UR: NEGATIVE mg/dL
LEUKOCYTES UA: NEGATIVE
Nitrite: NEGATIVE
PH: 5 (ref 5.0–8.0)
Protein, ur: NEGATIVE mg/dL
Specific Gravity, Urine: 1.008 (ref 1.005–1.030)
Squamous Epithelial / LPF: NONE SEEN

## 2016-10-09 LAB — ETHANOL: ALCOHOL ETHYL (B): 182 mg/dL — AB (ref <5)

## 2016-10-09 MED ORDER — TRAZODONE HCL 100 MG PO TABS
300.0000 mg | ORAL_TABLET | Freq: Every day | ORAL | Status: DC
  Administered 2016-10-09: 300 mg via ORAL
  Filled 2016-10-09: qty 3

## 2016-10-09 MED ORDER — ALUM & MAG HYDROXIDE-SIMETH 200-200-20 MG/5ML PO SUSP: 30.0000 mL | ORAL | Status: DC | PRN

## 2016-10-09 MED ORDER — PAROXETINE HCL 30 MG PO TABS: 30.0000 mg | ORAL_TABLET | Freq: Every morning | ORAL | Status: DC

## 2016-10-09 MED ORDER — ONDANSETRON HCL 4 MG PO TABS: 4.0000 mg | ORAL_TABLET | Freq: Three times a day (TID) | ORAL | Status: DC | PRN

## 2016-10-09 MED ORDER — CICLOPIROX 8 % EX SOLN: Freq: Every day | CUTANEOUS | Status: DC

## 2016-10-09 MED ORDER — CARVEDILOL 12.5 MG PO TABS
12.5000 mg | ORAL_TABLET | Freq: Two times a day (BID) | ORAL | Status: DC
  Administered 2016-10-10: 12.5 mg via ORAL
  Filled 2016-10-09: qty 1

## 2016-10-09 MED ORDER — ZOLPIDEM TARTRATE 5 MG PO TABS: 5.0000 mg | ORAL_TABLET | Freq: Every evening | ORAL | Status: DC | PRN

## 2016-10-09 MED ORDER — PRAVASTATIN SODIUM 20 MG PO TABS
20.0000 mg | ORAL_TABLET | Freq: Every day | ORAL | Status: DC
Start: 2016-10-10 — End: 2016-10-10

## 2016-10-09 MED ORDER — QUETIAPINE FUMARATE 100 MG PO TABS
100.0000 mg | ORAL_TABLET | Freq: Every day | ORAL | Status: DC
  Administered 2016-10-09: 100 mg via ORAL
  Filled 2016-10-09: qty 1

## 2016-10-09 MED ORDER — PANTOPRAZOLE SODIUM 40 MG PO TBEC
40.0000 mg | DELAYED_RELEASE_TABLET | Freq: Every day | ORAL | Status: DC
  Administered 2016-10-10: 40 mg via ORAL
  Filled 2016-10-09: qty 1

## 2016-10-09 MED ORDER — UMECLIDINIUM-VILANTEROL 62.5-25 MCG/INH IN AEPB
1.0000 | INHALATION_SPRAY | Freq: Every day | RESPIRATORY_TRACT | Status: DC
  Administered 2016-10-10: 1 via RESPIRATORY_TRACT
  Filled 2016-10-09: qty 14

## 2016-10-09 MED ORDER — ACETAMINOPHEN 325 MG PO TABS: 650.0000 mg | ORAL_TABLET | ORAL | Status: DC | PRN

## 2016-10-09 MED ORDER — ALBUTEROL SULFATE HFA 108 (90 BASE) MCG/ACT IN AERS: 1.0000 | INHALATION_SPRAY | Freq: Four times a day (QID) | RESPIRATORY_TRACT | Status: DC | PRN

## 2016-10-09 MED ORDER — LORAZEPAM 1 MG PO TABS: 1.0000 mg | ORAL_TABLET | Freq: Three times a day (TID) | ORAL | Status: DC | PRN

## 2016-10-09 MED ORDER — NICOTINE 21 MG/24HR TD PT24
21.0000 mg | MEDICATED_PATCH | Freq: Every day | TRANSDERMAL | Status: DC
  Administered 2016-10-10: 21 mg via TRANSDERMAL

## 2016-10-09 MED ORDER — NICOTINE 21 MG/24HR TD PT24
21.0000 mg | MEDICATED_PATCH | Freq: Every day | TRANSDERMAL | Status: DC
  Filled 2016-10-09: qty 1

## 2016-10-09 MED ORDER — LORATADINE 10 MG PO TABS
10.0000 mg | ORAL_TABLET | Freq: Every day | ORAL | Status: DC
  Administered 2016-10-10: 10 mg via ORAL
  Filled 2016-10-09: qty 1

## 2016-10-09 MED ORDER — EMTRICITABINE-TENOFOVIR AF 200-25 MG PO TABS
1.0000 | ORAL_TABLET | Freq: Every day | ORAL | Status: DC
  Filled 2016-10-09: qty 1

## 2016-10-09 MED ORDER — IPRATROPIUM-ALBUTEROL 0.5-2.5 (3) MG/3ML IN SOLN
3.0000 mL | Freq: Once | RESPIRATORY_TRACT | Status: AC
  Administered 2016-10-09: 3 mL via RESPIRATORY_TRACT
  Filled 2016-10-09: qty 3

## 2016-10-09 MED ORDER — VILAZODONE HCL 20 MG PO TABS
40.0000 mg | ORAL_TABLET | Freq: Every day | ORAL | Status: DC
  Administered 2016-10-10: 40 mg via ORAL
  Filled 2016-10-09: qty 2

## 2016-10-09 NOTE — ED Notes (Signed)
Bed: VJ28 Expected date:  Expected time:  Means of arrival:  Comments: 56 yo hallucinations
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
## 2016-10-09 NOTE — ED Notes (Signed)
Bed: [facility] Expected date:  Expected time:  Means of arrival:  Comments: Hold [place]

## 2016-10-09 NOTE — ED Notes (Signed)
SBAR Report received from previous nurse. Pt received calm and visible on unit. Pt denies current SI/ HI, A/V H, depression, anxiety, or pain at this time, and appears otherwise stable and free of distress. Pt reminded of camera surveillance, q 15 min rounds, and rules of the milieu. Will continue to assess. 

## 2016-10-09 NOTE — ED Triage Notes (Signed)
His father called EMS d/t pt. Hallucinating. Pt. Arrives in no distress. He tells [place] "I came here because demons are at my house. I called my dad, but he's a warlock, so he won't come up here." He further states, "nobody can see the demons but me, but I'm not crazy". He then states "My dad is probably flying around in space with his crazy-ass self" [sic].
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
## 2016-10-10 ENCOUNTER — Other Ambulatory Visit: Payer: Self-pay | Admitting: Thoracic Surgery (Cardiothoracic Vascular Surgery)

## 2016-10-10 DIAGNOSIS — Z823 Family history of stroke: Secondary | ICD-10-CM

## 2016-10-10 DIAGNOSIS — Z811 Family history of alcohol abuse and dependence: Secondary | ICD-10-CM

## 2016-10-10 DIAGNOSIS — Z833 Family history of diabetes mellitus: Secondary | ICD-10-CM

## 2016-10-10 DIAGNOSIS — Z813 Family history of other psychoactive substance abuse and dependence: Secondary | ICD-10-CM

## 2016-10-10 DIAGNOSIS — Z8371 Family history of colonic polyps: Secondary | ICD-10-CM

## 2016-10-10 DIAGNOSIS — Z818 Family history of other mental and behavioral disorders: Secondary | ICD-10-CM

## 2016-10-10 DIAGNOSIS — F200 Paranoid schizophrenia: Secondary | ICD-10-CM | POA: Diagnosis not present

## 2016-10-10 DIAGNOSIS — Z888 Allergy status to other drugs, medicaments and biological substances status: Secondary | ICD-10-CM

## 2016-10-10 DIAGNOSIS — Z8249 Family history of ischemic heart disease and other diseases of the circulatory system: Secondary | ICD-10-CM | POA: Diagnosis not present

## 2016-10-10 DIAGNOSIS — Z9889 Other specified postprocedural states: Secondary | ICD-10-CM

## 2016-10-10 DIAGNOSIS — C3490 Malignant neoplasm of unspecified part of unspecified bronchus or lung: Secondary | ICD-10-CM

## 2016-10-10 DIAGNOSIS — Z800 Family history of malignant neoplasm of digestive organs: Secondary | ICD-10-CM

## 2016-10-10 DIAGNOSIS — Z79899 Other long term (current) drug therapy: Secondary | ICD-10-CM

## 2016-10-10 DIAGNOSIS — F17210 Nicotine dependence, cigarettes, uncomplicated: Secondary | ICD-10-CM

## 2016-10-10 NOTE — ED Notes (Signed)
Pt discharged home. Discharged instructions read to pt who verbalized understanding. All belongings returned to pt who signed for same. Denies SI/HI, is not delusional and not responding to internal stimuli. Escorted pt to the ED exit.    

## 2016-10-10 NOTE — Progress Notes (Unsigned)
cxr 

## 2016-10-10 NOTE — BH Assessment (Signed)
[facility] Assessment Progress Note  Per Corena Pilgrim, MD, this pt does not require psychiatric hospitalization at this time.  Pt presents under IVC initiated by EDP Daleen Bo, MD, which Dr Darleene Cleaver has rescinded.  Pt is to be discharged from [facility] with recommendation to continue treatment with [facility], his current outpatient provider.  This has been included in pt's discharge instructions.  Pt's nurse, Diane, has been notified.  Jalene Mullet, [place] Triage Specialist [phone]

## 2016-10-10 NOTE — BHH Suicide Risk Assessment (Signed)
Suicide Risk Assessment  Discharge Assessment   [facility] Discharge Suicide Risk Assessment   Principal Problem: Paranoid schizophrenia [facility]) Discharge Diagnoses:  Patient Active Problem List   Diagnosis Date Noted  . Paranoid schizophrenia ([place]) [F20.0] 08/01/2011    Priority: High  . 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 Glaucoma suspect of both eyes [H40.003] 11/19/2015  . Non-small cell carcinoma of lung, stage 1 ([place]) [C34.90] 10/12/2015  . Alcohol abuse [F10.10]   . COPD GOLD 0 copd  [J44.9] 08/11/2015  . High risk sexual behavior [Z72.51] 07/09/2014  . Porokeratosis [Q82.8] 03/05/2014  . LOW BACK PAIN [M54.5] 01/09/2013  . PUD (peptic ulcer disease) [K27.9] 01/09/2013  . Routine general medical examination at a health care facility [Z00.00] 06/04/2012  . Depression with anxiety [F41.8] 06/15/2011  . DJD (degenerative joint disease) of knee [M17.10] 03/15/2011  . SLEEP APNEA [G47.30] 11/29/2010  . 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 Allergic rhinitis [J30.9] 03/17/2010  . Cigarette smoker [F17.210] 12/14/2009  . HYPERTENSION, BENIGN [I10] 10/05/2009  . 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 Unspecified peripheral vascular disease [I73.9] 09/15/2009  . Thrombocytopenia ([place]) [D69.6] 10/30/2008  . Hyperglycemia [R73.9] 06/23/2008  . Hyperlipidemia with target LDL less than 130 [E78.5] 06/23/2008  . BENIGN PROSTATIC HYPERTROPHY, WITH OBSTRUCTION [N40.1] 06/23/2008    Total Time spent with patient: 45 minutes   Musculoskeletal: Strength & Muscle Tone: within normal limits Gait & Station: normal Patient leans: N/A  Psychiatric Specialty Exam: Physical Exam  Constitutional: He is oriented to person, place, and time. He appears well-developed and well-nourished.  HENT:  Head: Normocephalic.  Neck: Normal range of motion.  Respiratory: Effort normal.  Musculoskeletal: Normal range of motion.  Neurological: He is alert and oriented to person,  place, and time.  Psychiatric: He has a normal mood and affect. His speech is normal and behavior is normal. Judgment and thought content normal. Cognition and memory are normal.    Review of Systems  Psychiatric/Behavioral: Positive for substance abuse.  All other systems reviewed and are negative.   Blood pressure 130/80, pulse 95, temperature 97.5 F (36.4 C), temperature source Oral, resp. rate 18, SpO2 98 %.There is no height or weight on file to calculate BMI.  General Appearance: Casual  Eye Contact:  Good  Speech:  Normal Rate  Volume:  Normal  Mood:  Euthymic  Affect:  Congruent  Thought Process:  Coherent and Descriptions of Associations: Intact  Orientation:  Full (Time, Place, and Person)  Thought Content:  WDL  Suicidal Thoughts:  No  Homicidal Thoughts:  No  Memory:  Immediate;   Good Recent;   Good Remote;   Good  Judgement:  Fair  Insight:  Fair  Psychomotor Activity:  Normal  Concentration:  Concentration: Good and Attention Span: Good  Recall:  Good  Fund of Knowledge:  Fair  Language:  Good  Akathisia:  No  Handed:  Right  AIMS (if indicated):     Assets:  Housing Leisure Time Physical Health Resilience Social Support  ADL's:  Intact  Cognition:  WNL  Sleep:      Mental Status Per Nursing Assessment::   On Admission:   alcohol abuse with hallucinations and delusions  Demographic Factors:  Male  Loss Factors: NA  Historical Factors: NA  Risk Reduction Factors:   Sense of responsibility to family, Positive social support and Positive therapeutic relationship  Continued Clinical Symptoms:  None  Cognitive Features That Contribute To Risk:  None    Suicide Risk:  Minimal: No identifiable suicidal ideation.  Patients presenting with no risk factors but with morbid ruminations; may be classified as minimal risk based on the severity of the depressive symptoms    Plan Of Care/Follow-up recommendations:  Activity:  as tolerated Diet:   heart healthy diet  LORD, JAMISON, NP 10/10/2016, 11:07 AM

## 2016-10-10 NOTE — Discharge Instructions (Signed)
For your ongoing behavioral health needs, you are advised to continue treatment with [place]:       [place] [facility]      [address]      [place], [place] [address]      [phone]
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
## 2016-10-11 ENCOUNTER — Ambulatory Visit
Admission: RE | Admit: 2016-10-11 | Discharge: 2016-10-11 | Disposition: A | Discharge status: Home / Self Care | Payer: Medicare Other | Source: Ambulatory Visit | Attending: Thoracic Surgery (Cardiothoracic Vascular Surgery) | Admitting: Thoracic Surgery (Cardiothoracic Vascular Surgery)

## 2016-10-11 ENCOUNTER — Telehealth: Payer: Self-pay | Admitting: Internal Medicine

## 2016-10-11 ENCOUNTER — Encounter: Payer: Self-pay | Admitting: Thoracic Surgery (Cardiothoracic Vascular Surgery)

## 2016-10-11 ENCOUNTER — Ambulatory Visit (INDEPENDENT_AMBULATORY_CARE_PROVIDER_SITE_OTHER): Payer: Medicare Other | Admitting: Thoracic Surgery (Cardiothoracic Vascular Surgery)

## 2016-10-11 ENCOUNTER — Encounter: Payer: Self-pay | Admitting: *Deleted

## 2016-10-11 VITALS — BP 126/83 | HR 110 | Resp 20 | Ht 75.0 in | Wt 201.0 lb

## 2016-10-11 DIAGNOSIS — Z902 Acquired absence of lung [part of]: Secondary | ICD-10-CM | POA: Diagnosis not present

## 2016-10-11 DIAGNOSIS — C3490 Malignant neoplasm of unspecified part of unspecified bronchus or lung: Secondary | ICD-10-CM

## 2016-10-11 DIAGNOSIS — R918 Other nonspecific abnormal finding of lung field: Secondary | ICD-10-CM | POA: Diagnosis not present

## 2016-10-11 NOTE — Progress Notes (Signed)
Oncology Nurse Navigator Documentation  Oncology Nurse Navigator Flowsheets 10/11/2016  Navigator Encounter Type Other/placed scheduling request for a follow up with Dr. Julien Nordmann at next available appt due to him missing his appt in Dec.   Treatment Phase Follow-up  Barriers/Navigation Needs Coordination of Care  Interventions Coordination of Care  Coordination of Care Appts  Acuity Level 1  Time Spent with Patient 30
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
## 2016-10-11 NOTE — Telephone Encounter (Signed)
sw pt to confirm 1/23 appt date/time per LOS
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
## 2016-10-17 ENCOUNTER — Other Ambulatory Visit: Payer: Self-pay | Admitting: Internal Medicine

## 2016-10-18 ENCOUNTER — Encounter: Payer: Self-pay | Admitting: Internal Medicine

## 2016-10-18 ENCOUNTER — Ambulatory Visit (HOSPITAL_BASED_OUTPATIENT_CLINIC_OR_DEPARTMENT_OTHER): Payer: Medicare Other | Admitting: Internal Medicine

## 2016-10-18 VITALS — BP 151/91 | HR 88 | Temp 98.2°F | Resp 18 | Ht 75.0 in | Wt 203.6 lb

## 2016-10-18 DIAGNOSIS — Z85118 Personal history of other malignant neoplasm of bronchus and lung: Secondary | ICD-10-CM | POA: Diagnosis not present

## 2016-10-18 DIAGNOSIS — F17210 Nicotine dependence, cigarettes, uncomplicated: Secondary | ICD-10-CM

## 2016-10-18 DIAGNOSIS — C3491 Malignant neoplasm of unspecified part of right bronchus or lung: Secondary | ICD-10-CM

## 2016-10-18 DIAGNOSIS — F200 Paranoid schizophrenia: Secondary | ICD-10-CM
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
## 2016-10-25 IMAGING — CT CT CHEST W/ CM
2 of 4 series · 15 of 36 positions shown, 18 images · IV contrast (omnipaque)
Comparison: Two-view chest x-ray 07/24/2015

CLINICAL DATA: Abnormal chest x-ray.  Pulmonary nodule.

EXAM:
CT CHEST WITH CONTRAST
TECHNIQUE: Multidetector CT imaging of the chest was performed during
intravenous contrast administration.
CONTRAST:  80mL OMNIPAQUE IOHEXOL 300 MG/ML  SOLN

[Series 2: chest routine with · axial · 0.74mm/px · z∈[-297,-27]mm · 12 of 64 slices shown, 15 images]
[im 5/64  mediastinal]
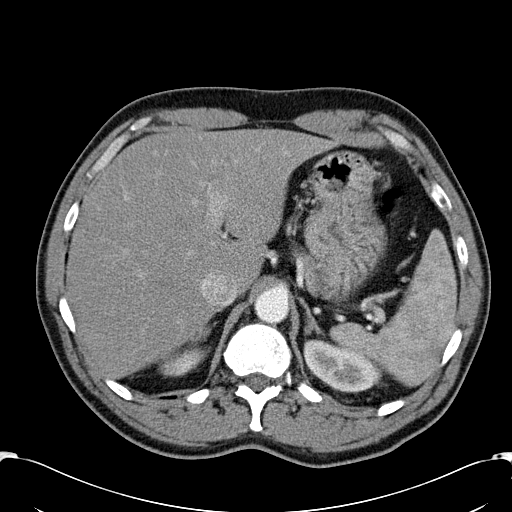
[im 5/64  lung]
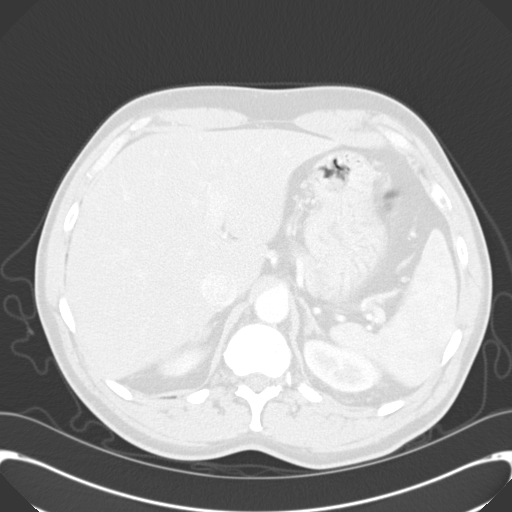
[im 10/64  lung]
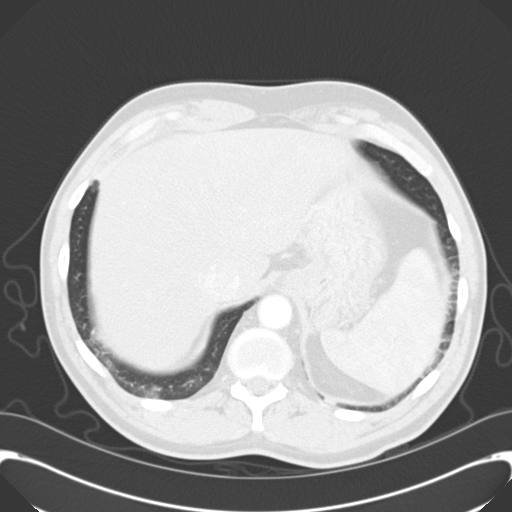
[im 15/64  lung]
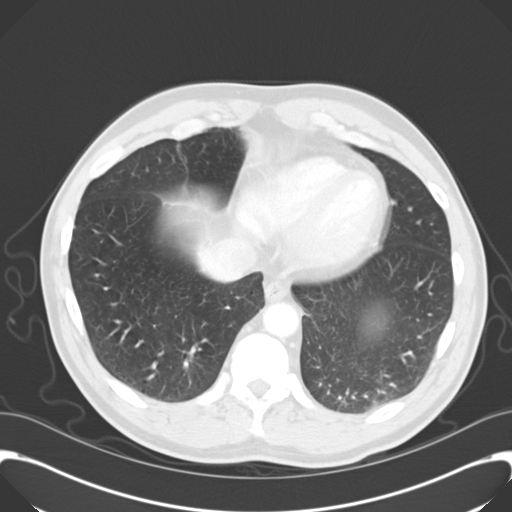
[im 20/64  lung]
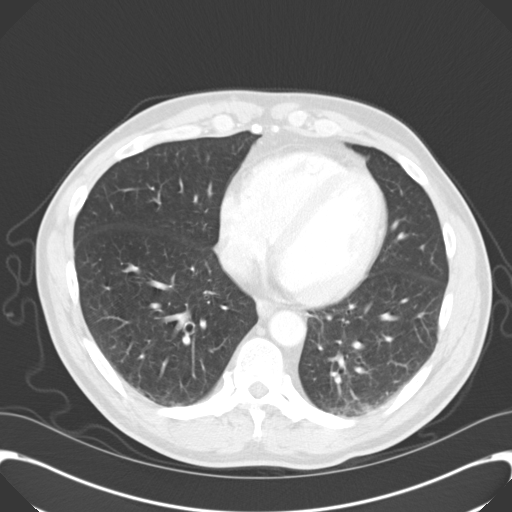
[im 25/64  mediastinal]
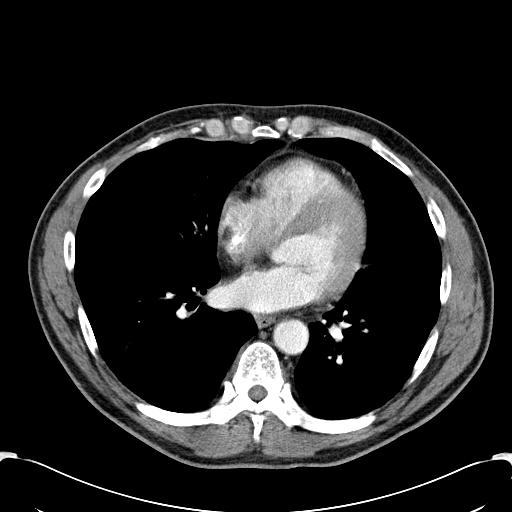
[im 25/64  lung]
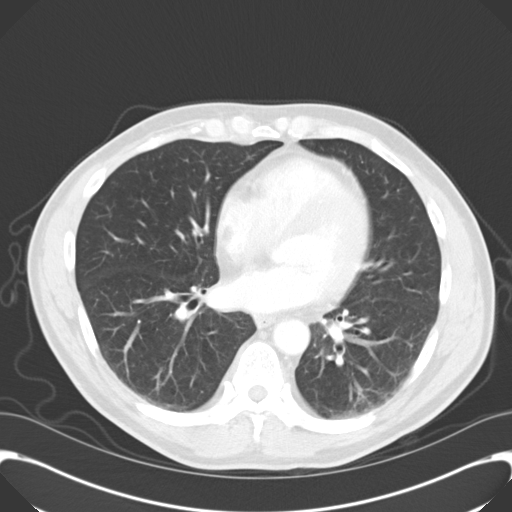
[im 30/64  lung]
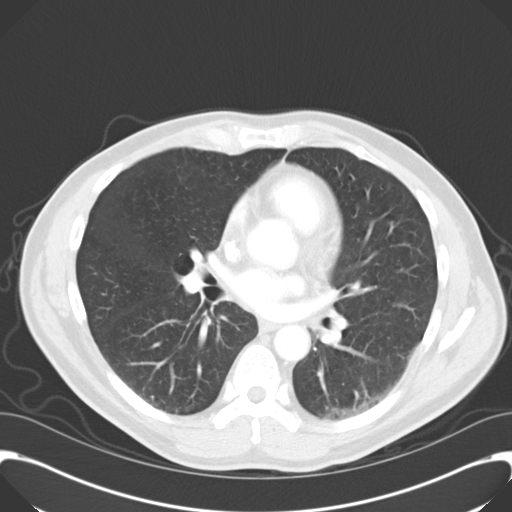
[im 34/64  lung]
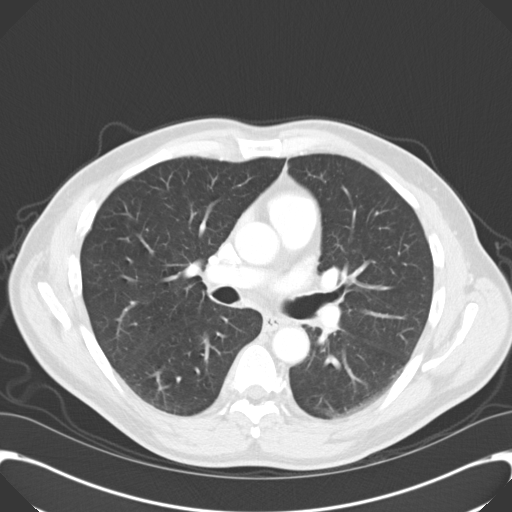
[im 39/64  lung]
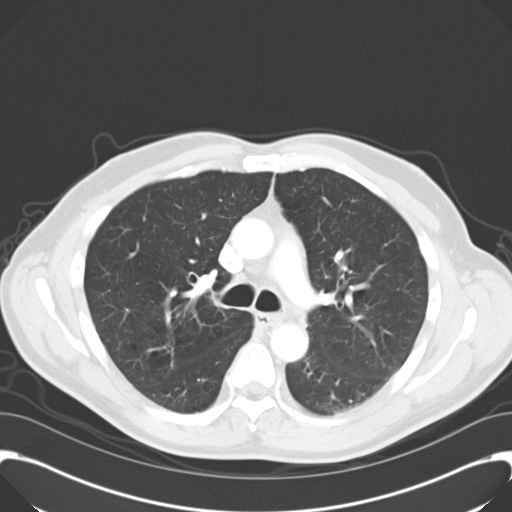
[im 44/64  mediastinal]
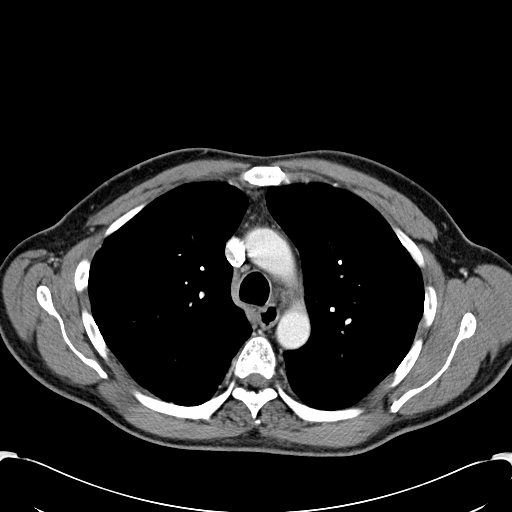
[im 44/64  lung]
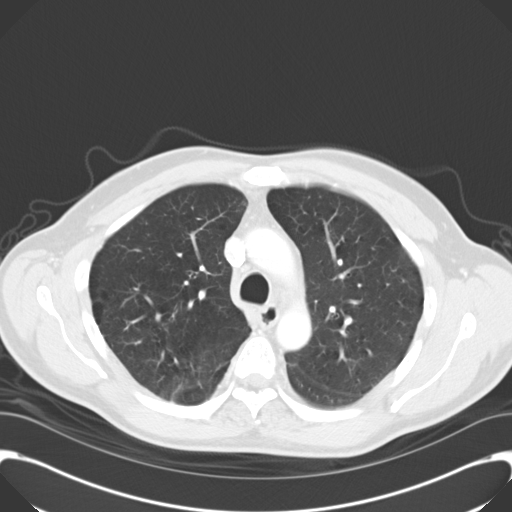
[im 49/64  lung]
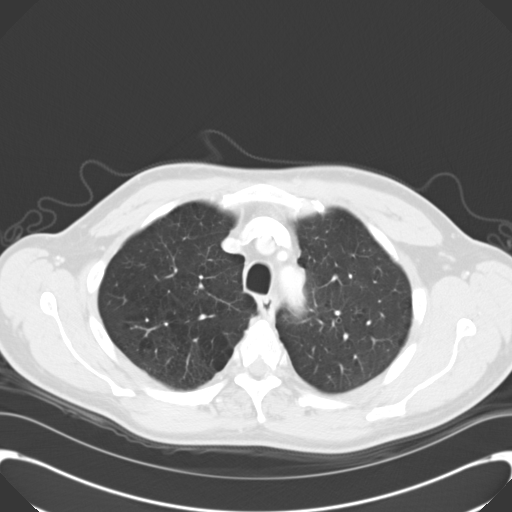
[im 54/64  lung]
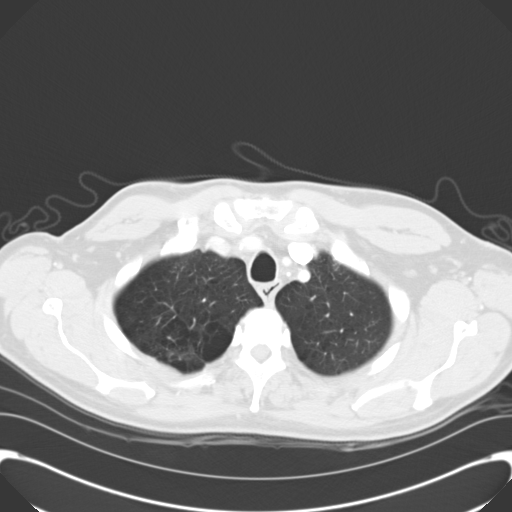
[im 59/64  lung]
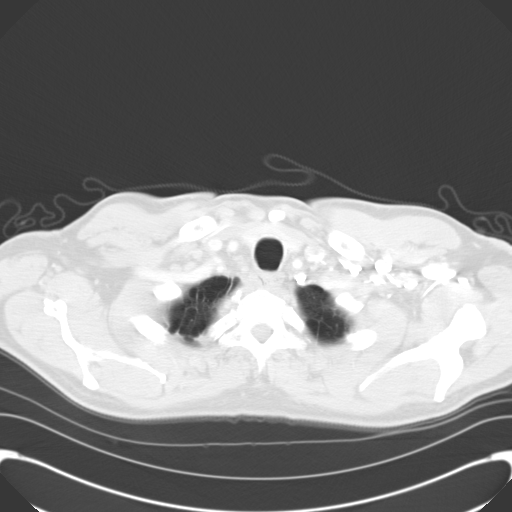

[Series 602: cor · coronal · 0.74mm/px · 3 of 124 slices shown]
[im 25/124  lung]
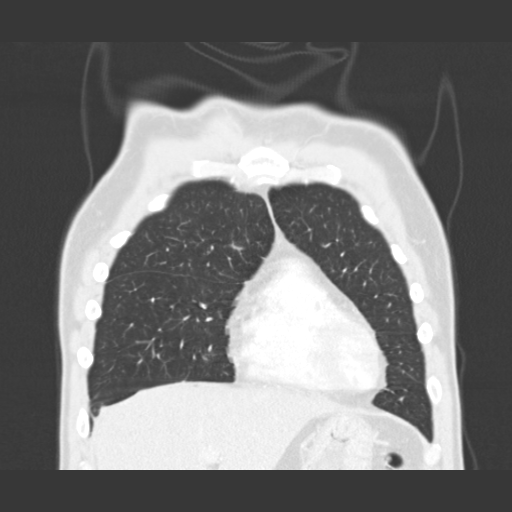
[im 50/124  lung]
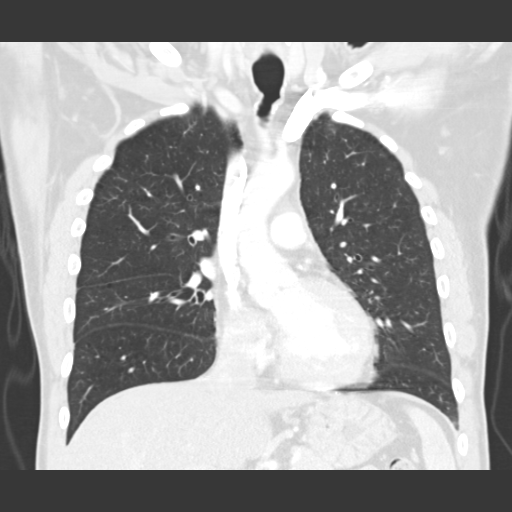
[im 74/124  lung]
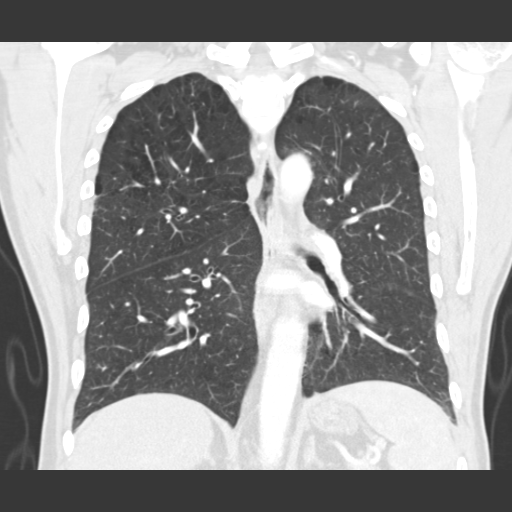

[15 of 36 positions shown; findings below may reference images not displayed]

FINDINGS: A 9 x 12 x 8 mm spiculated nodule is noted anteriorly within the
right upper lobe. This is best visualized on image 23 of series 3.
Centrilobular emphysematous changes are noted. No other focal nodule
is present. There is mild dependent atelectasis.

Enlarged left AP window lymph nodes measure up to 9 x 14 mm on axial
images. No significant right peritracheal adenopathy is present.
There is no significant hilar adenopathy. There is no significant
hilar adenopathy.

The thoracic inlet is within normal limits. The esophagus is
unremarkable. Limited imaging of the abdomen is within normal
limits.

The bone windows are unremarkable.
IMPRESSION: 1. Spiculated pulmonary nodule confirmed within the anterior right
middle lobe. This is highly worrisome for a primary lung carcinoma.
2. Enlarged AP window lymph nodes. These are nonspecific. No
ipsilateral adenopathy is evident. Recommend PET scan for further
evaluation of the 14 mm spiculated nodule and hilar lymph nodes.
3. Emphysema.

## 2016-10-28 ENCOUNTER — Telehealth: Payer: Self-pay | Admitting: Internal Medicine

## 2016-10-28 NOTE — Telephone Encounter (Signed)
Left a message for the patient to call back to confirm appointments in for July 23 and July30

## 2016-11-07 ENCOUNTER — Encounter: Payer: Self-pay | Admitting: Internal Medicine

## 2016-11-07 ENCOUNTER — Ambulatory Visit: Payer: Self-pay | Admitting: Internal Medicine
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
## 2016-11-09 IMAGING — CT NM PET TUM IMG INITIAL (PI) SKULL BASE T - THIGH
7 of 8 series · 20 of 25 positions shown · non-contrast
Comparison: 08/06/2015

CLINICAL DATA: Initial treatment strategy for pulmonary nodule.

EXAM:
NUCLEAR MEDICINE PET SKULL BASE TO THIGH
TECHNIQUE: 9.2 mCi F-18 FDG was injected intravenously. Full-ring PET imaging
was performed from the skull base to thigh after the radiotracer. CT
data was obtained and used for attenuation correction and anatomic
localization.
FASTING BLOOD GLUCOSE:  Value: 103 mg/dl

[Series 3: pet sk_thigh ac · axial · 5.0mm · 4.07mm/px · z∈[-1237,-309]mm · 4 of 233 slices shown]
[im 1/233]
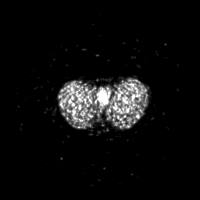
[im 59/233]
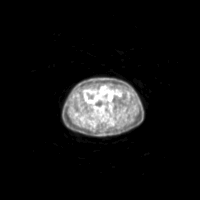
[im 175/233]
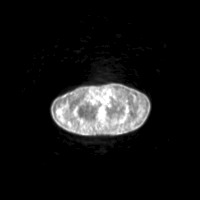
[im 233/233]
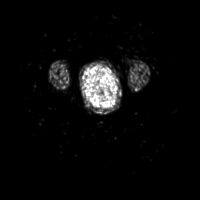

[Series 4: ct sk_thigh 5.0 b31f · axial · 5.0mm · 0.84mm/px · z∈[-1237,-309]mm · 4 of 233 slices shown]
[im 1/233]
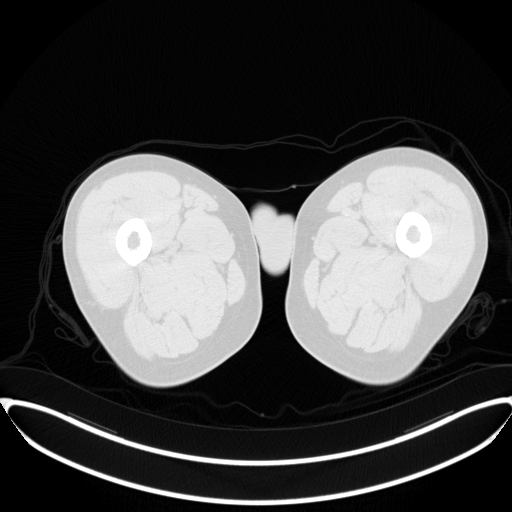
[im 59/233]
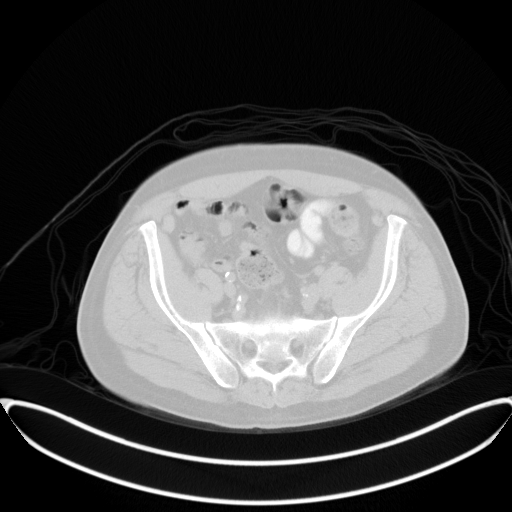
[im 175/233]
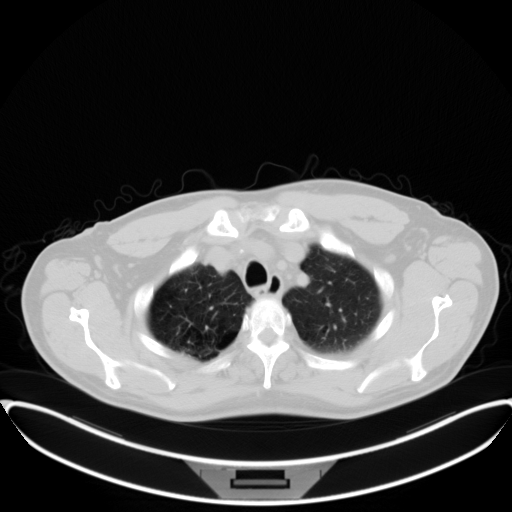
[im 233/233  brain]
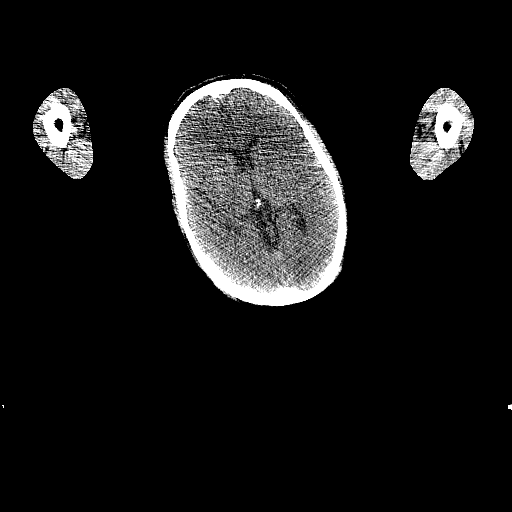

[Series 6: ct sk_thigh 5.0 b70f lung_bone · axial · 5.0mm · 0.67mm/px · z∈[-777,-493]mm · 2 of 72 slices shown]
[im 1/72  bone]
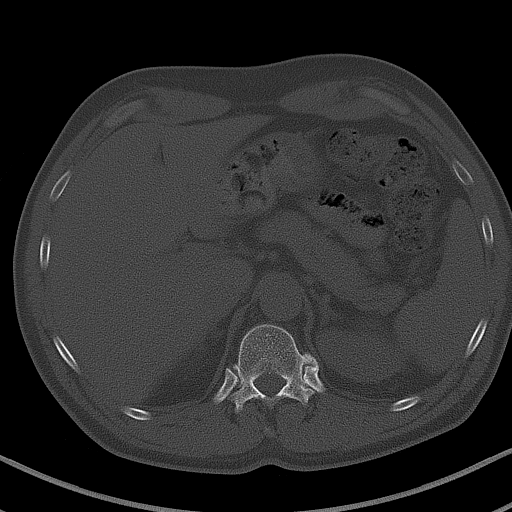
[im 72/72  bone]
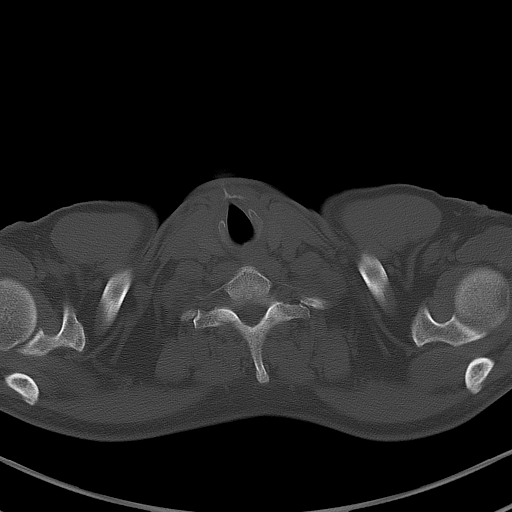

[Series 8: pet sk_thigh nac · axial · 5.0mm · 4.07mm/px · z∈[-1005,-309]mm · 4 of 233 slices shown]
[im 59/233]
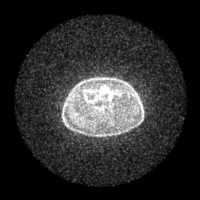
[im 117/233]
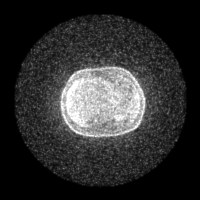
[im 175/233]
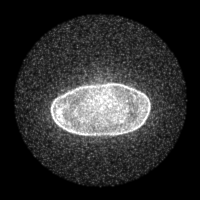
[im 233/233]
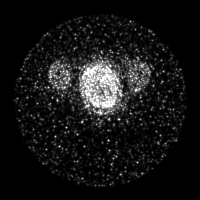

[Series 605: range-ct sk_thigh 5.0 (id)<alpha range> · 1 of 57 slices shown (1 of 2)]
[im 1/57]
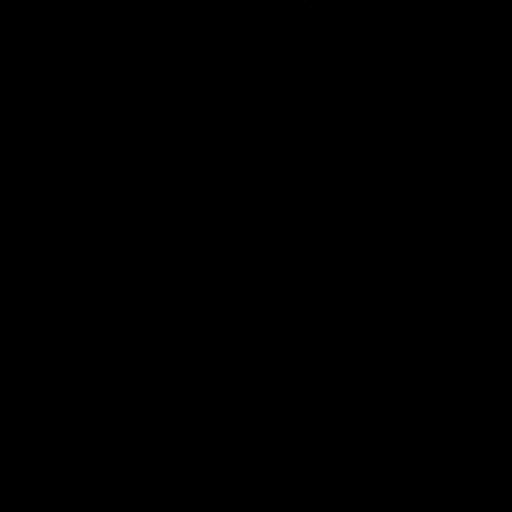

[Series 606: range-ct sk_thigh 5.0 (id)<alpha range> · 4 of 219 slices shown (2 of 2)]
[im 1/219]
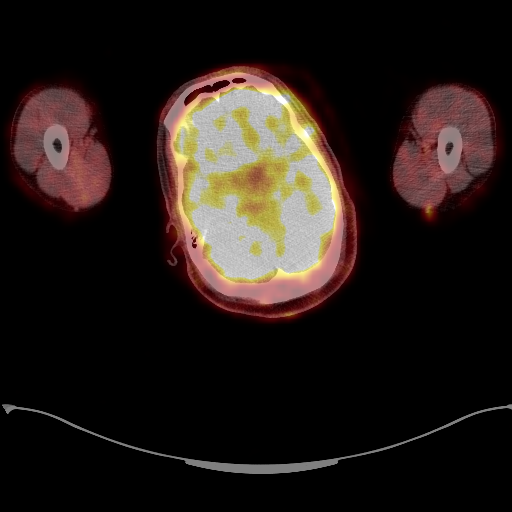
[im 55/219]
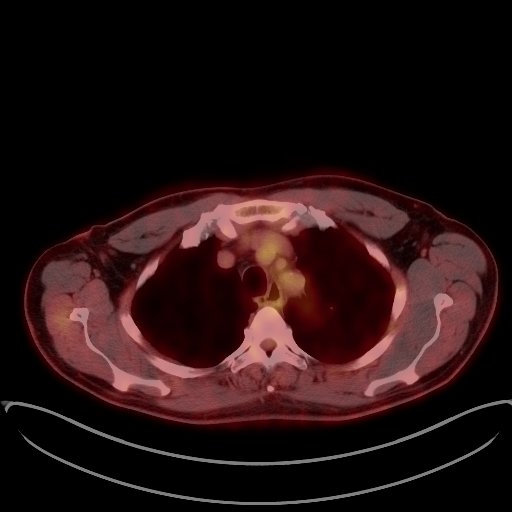
[im 110/219]
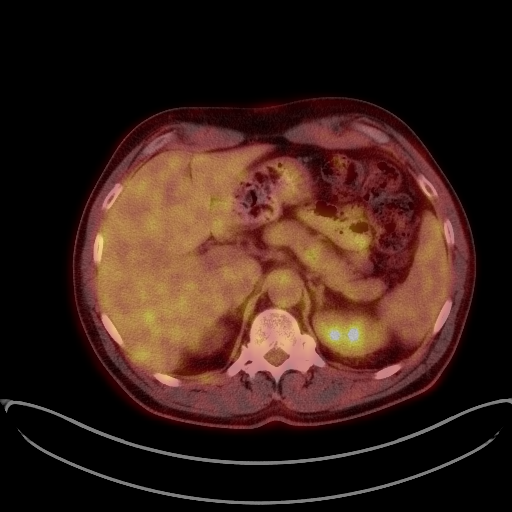
[im 219/219]
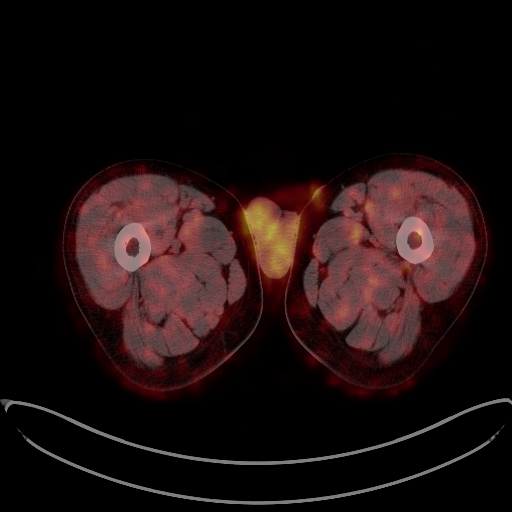

[Series 1032: results mm oncology reading · 0.38mm/px · 1 of 1 slices shown]
[im 1/1]
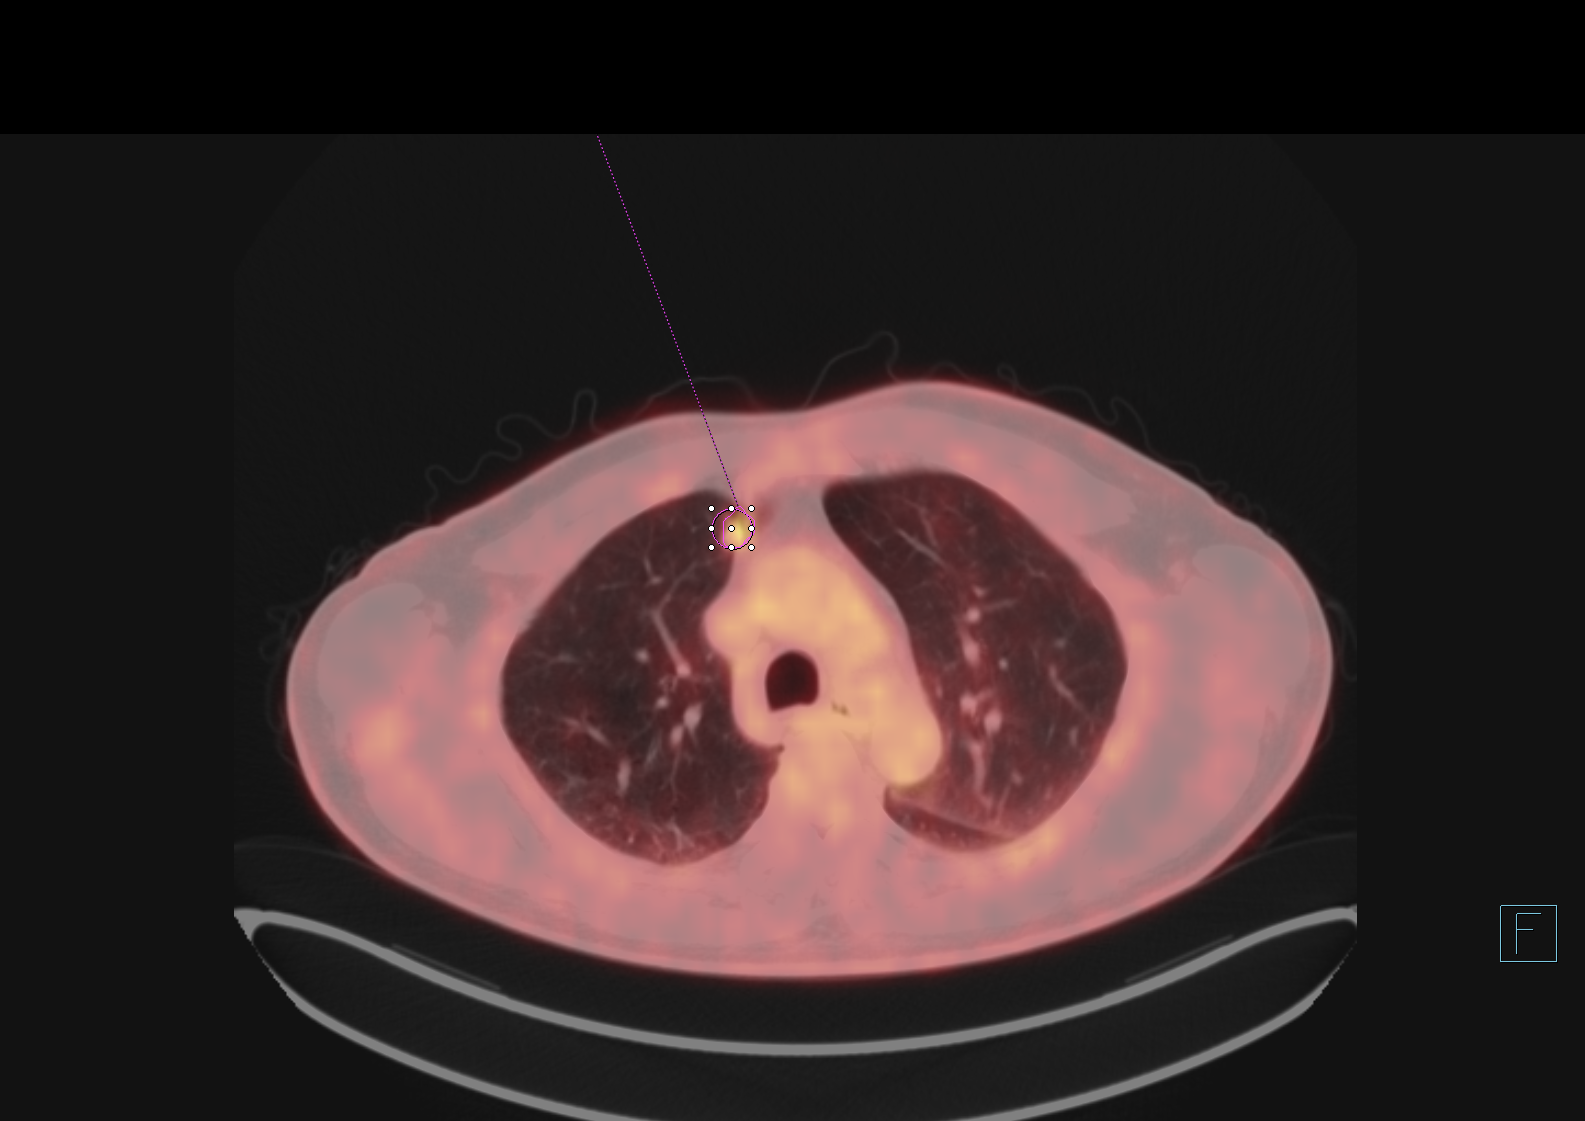

[20 of 25 positions shown; findings below may reference images not displayed]

FINDINGS: NECK

No hypermetabolic lymph nodes in the neck.

CHEST

No hypermetabolic mediastinal or hilar lymph nodes. No
hypermetabolic axillary or supraclavicular lymph nodes. There is no
pleural fluid. Moderate changes of centrilobular emphysema
identified. Again noted is a suspicious nodule in the anterior right
middle lobe which on today's exam measures 9 mm, image 24 of series
6. The SUV max associated with this nodule is equal to 3.43 which is
within the malignant range and is suspicious for primary pulmonary
neoplasm.

ABDOMEN/PELVIS

No abnormal hypermetabolic activity within the liver, pancreas,
adrenal glands, or spleen. Aortic atherosclerosis noted. No
hypermetabolic lymph nodes in the abdomen or pelvis.

SKELETON

No focal hypermetabolic activity to suggest skeletal metastasis.
IMPRESSION: 1. There is malignant range FDG uptake associated with the anterior
right upper lobe pulmonary nodule worrisome for primary pulmonary
neoplasm. Assuming non-small cell histology this may be consistent
with a [id number] lesion. Correlation with tissue sampling recommended.
2. Aortic atherosclerosis
3. Emphysema.

## 2016-11-22 IMAGING — DX DG CHEST 2V
2 series · 2 of 2 positions shown · non-contrast
Comparison: Chest radiograph dated 07/24/2015 and CT dated
08/06/2015

CLINICAL DATA: [age] canal with chest pain and shortness of
breath

EXAM:
CHEST  2 VIEW

[chest pa]
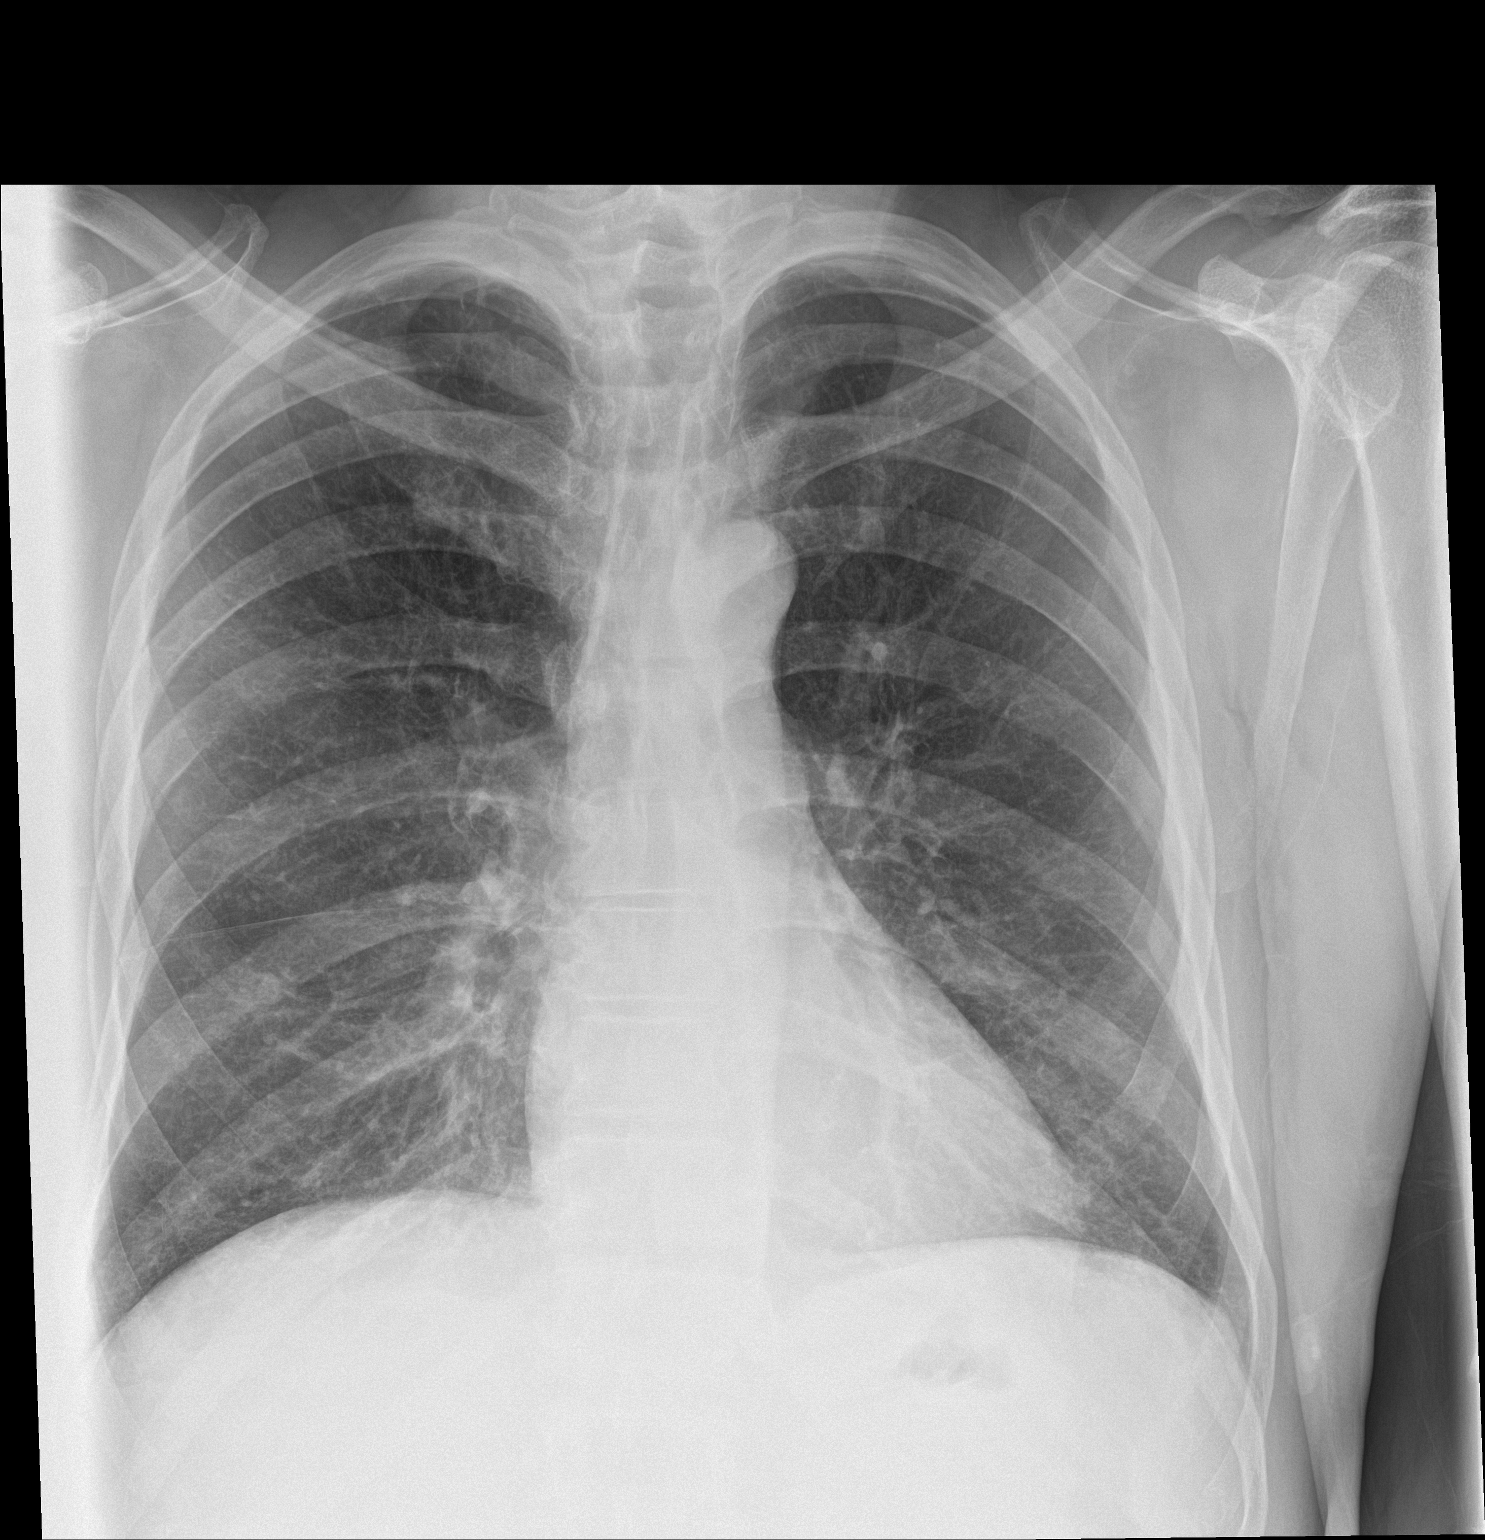

[chest lat]
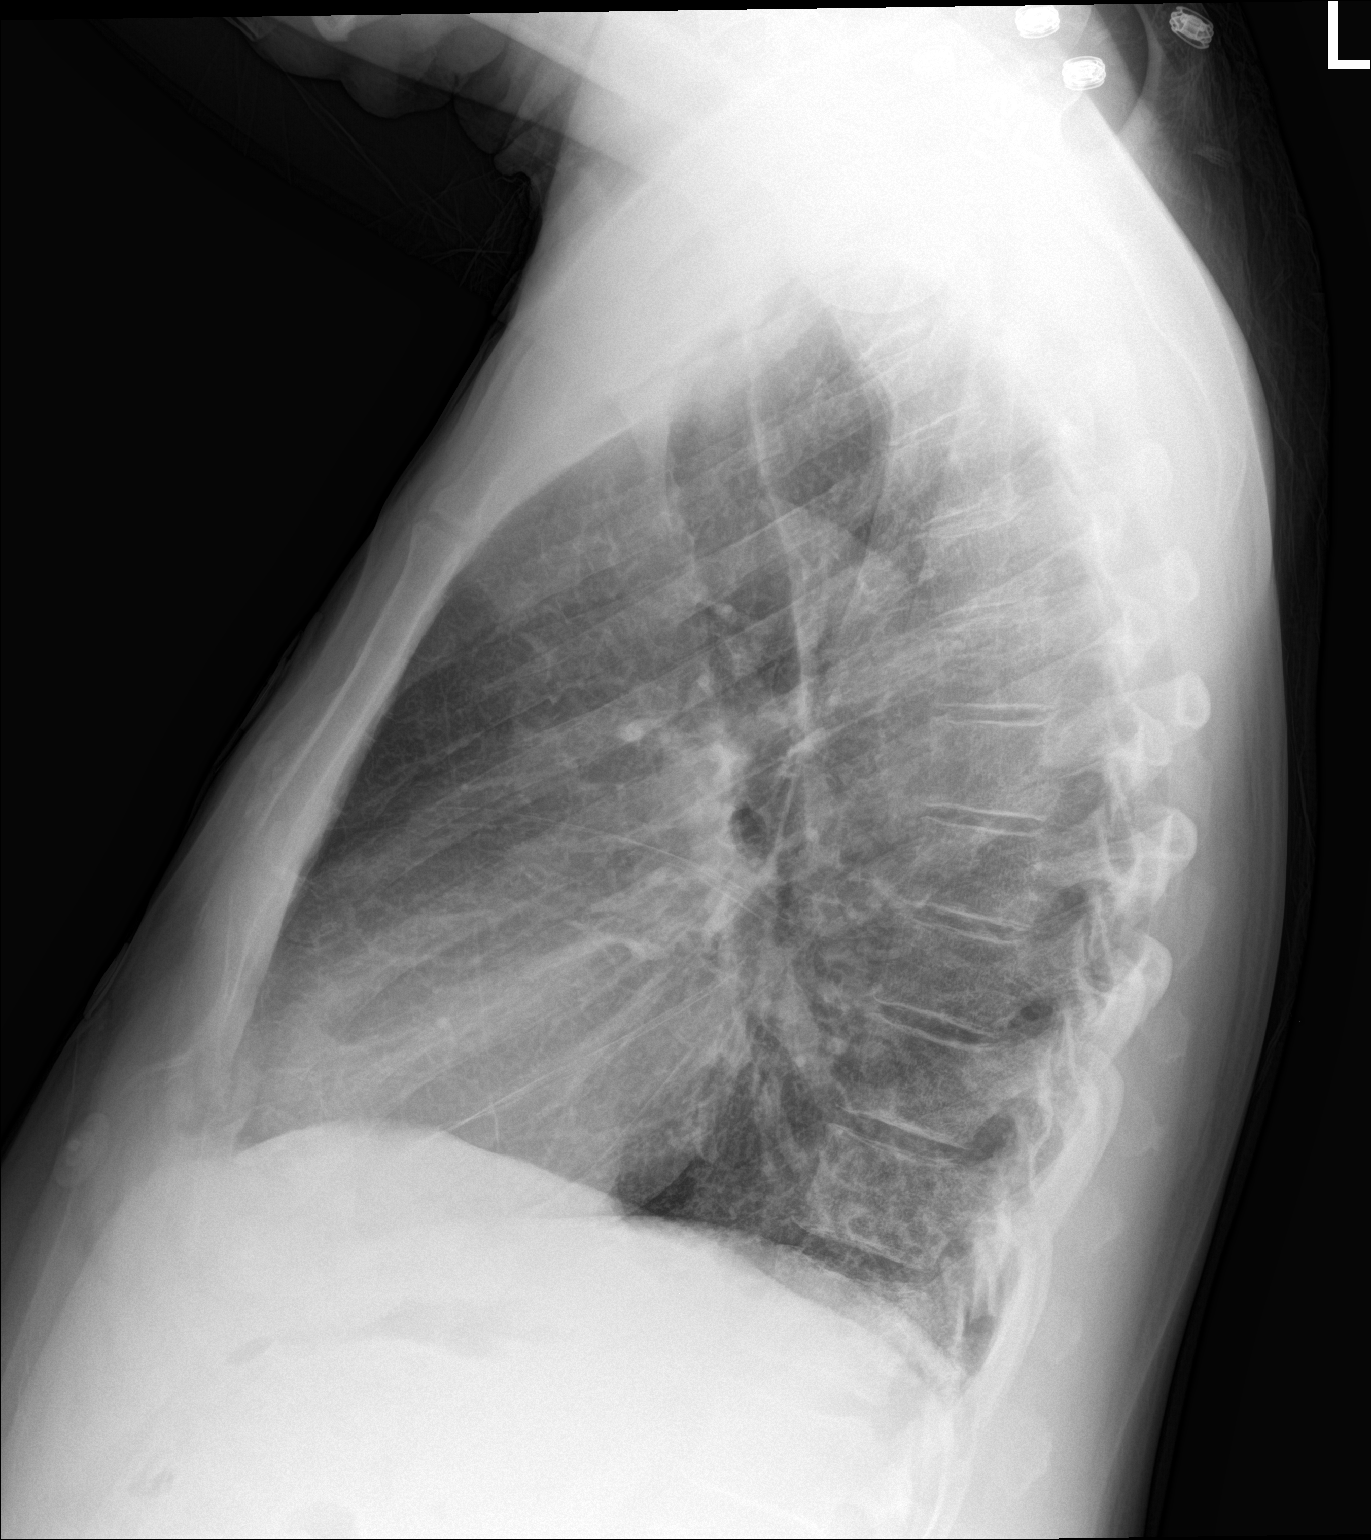

[2 of 2 positions shown; findings below may reference images not displayed]

FINDINGS: Two views of the chest demonstrate minimal bibasilar atelectatic
changes. There is no focal consolidation, pleural effusion, or
pneumothorax. A 7 mm nodular density in the right lower lung field
Corresponds to the focal nodule/scarring seen on the prior CT in the
medial aspect of the right upper lobe. The cardiac silhouette is
within normal limits. The osseous structures are grossly
unremarkable.
IMPRESSION: No active cardiopulmonary disease.

Right lower lung field pulmonary nodule.  Follow-up recommended.

## 2016-11-23 IMAGING — CR DG CHEST 1V PORT
2 series · 2 of 2 positions shown · non-contrast
Comparison: Frontal and lateral views 09/03/2015

CLINICAL DATA: Post right upper lobectomy for lung cancer, in PACU.
Shortness of breath

EXAM:
PORTABLE CHEST 1 VIEW

[AP (1 of 2)]
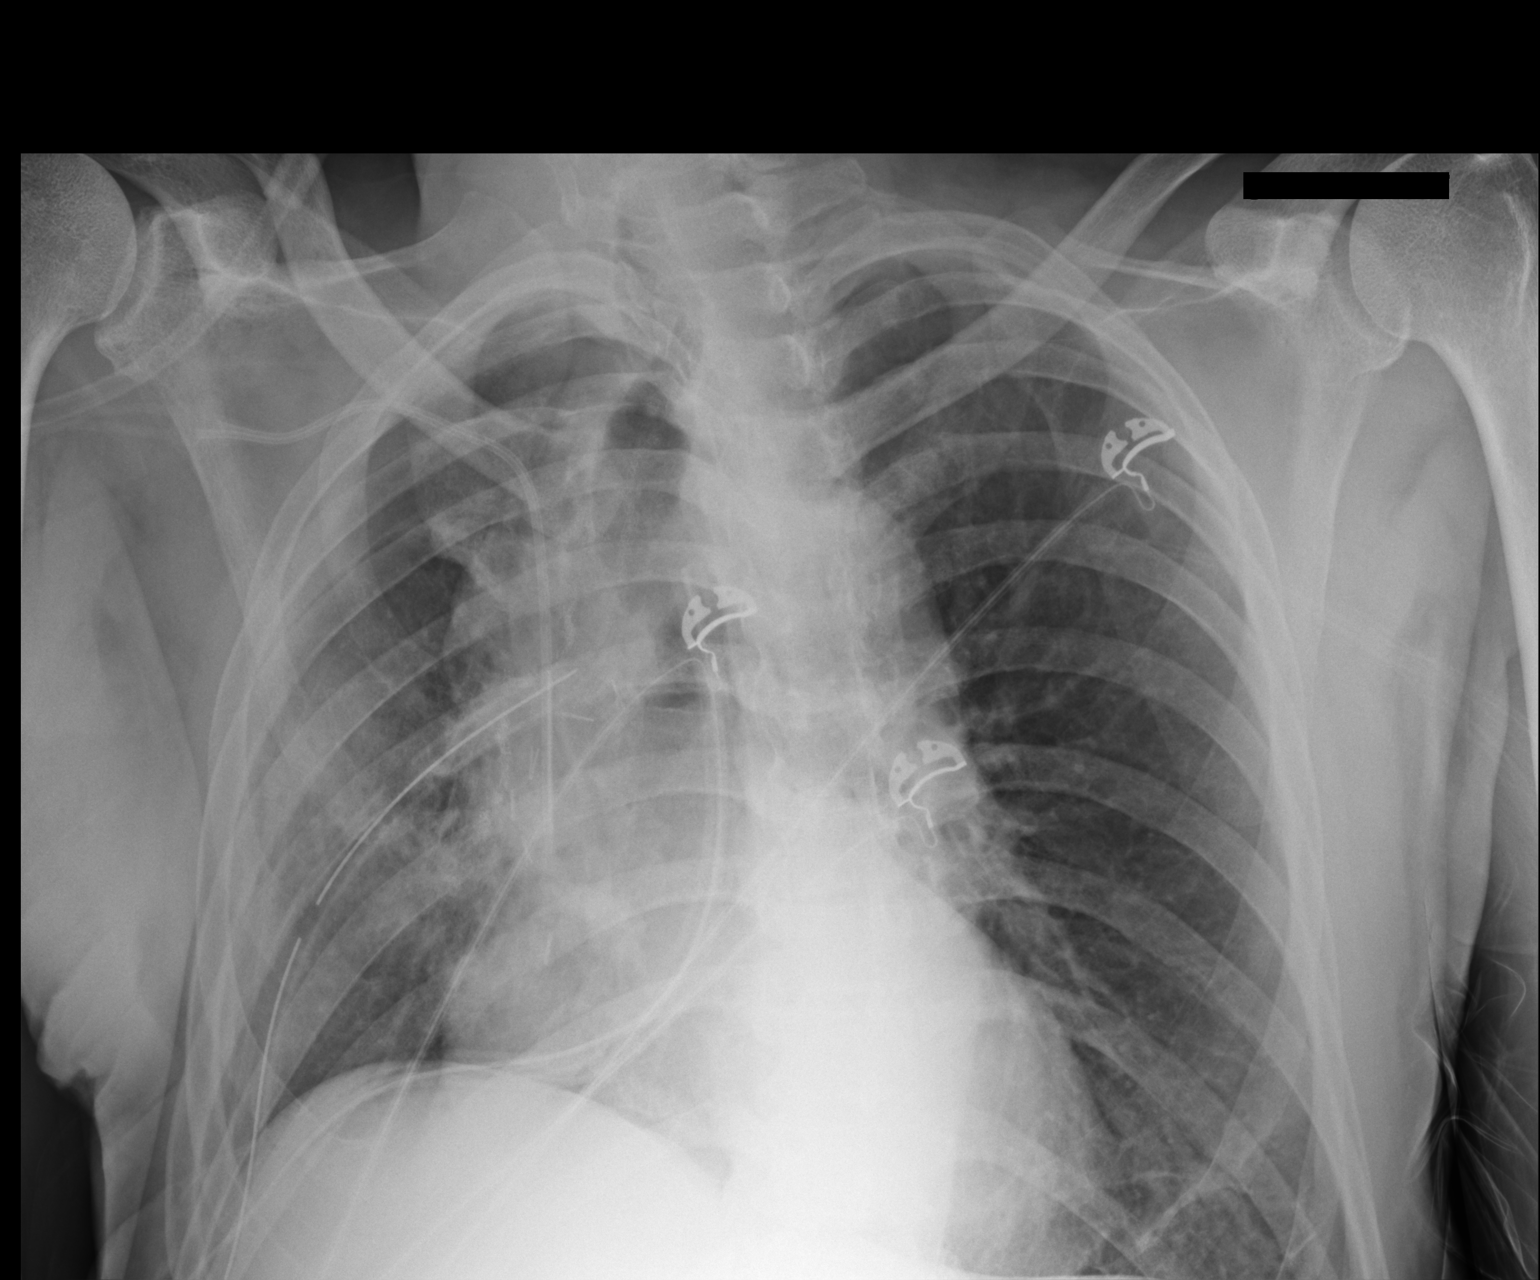

[AP (2 of 2)]
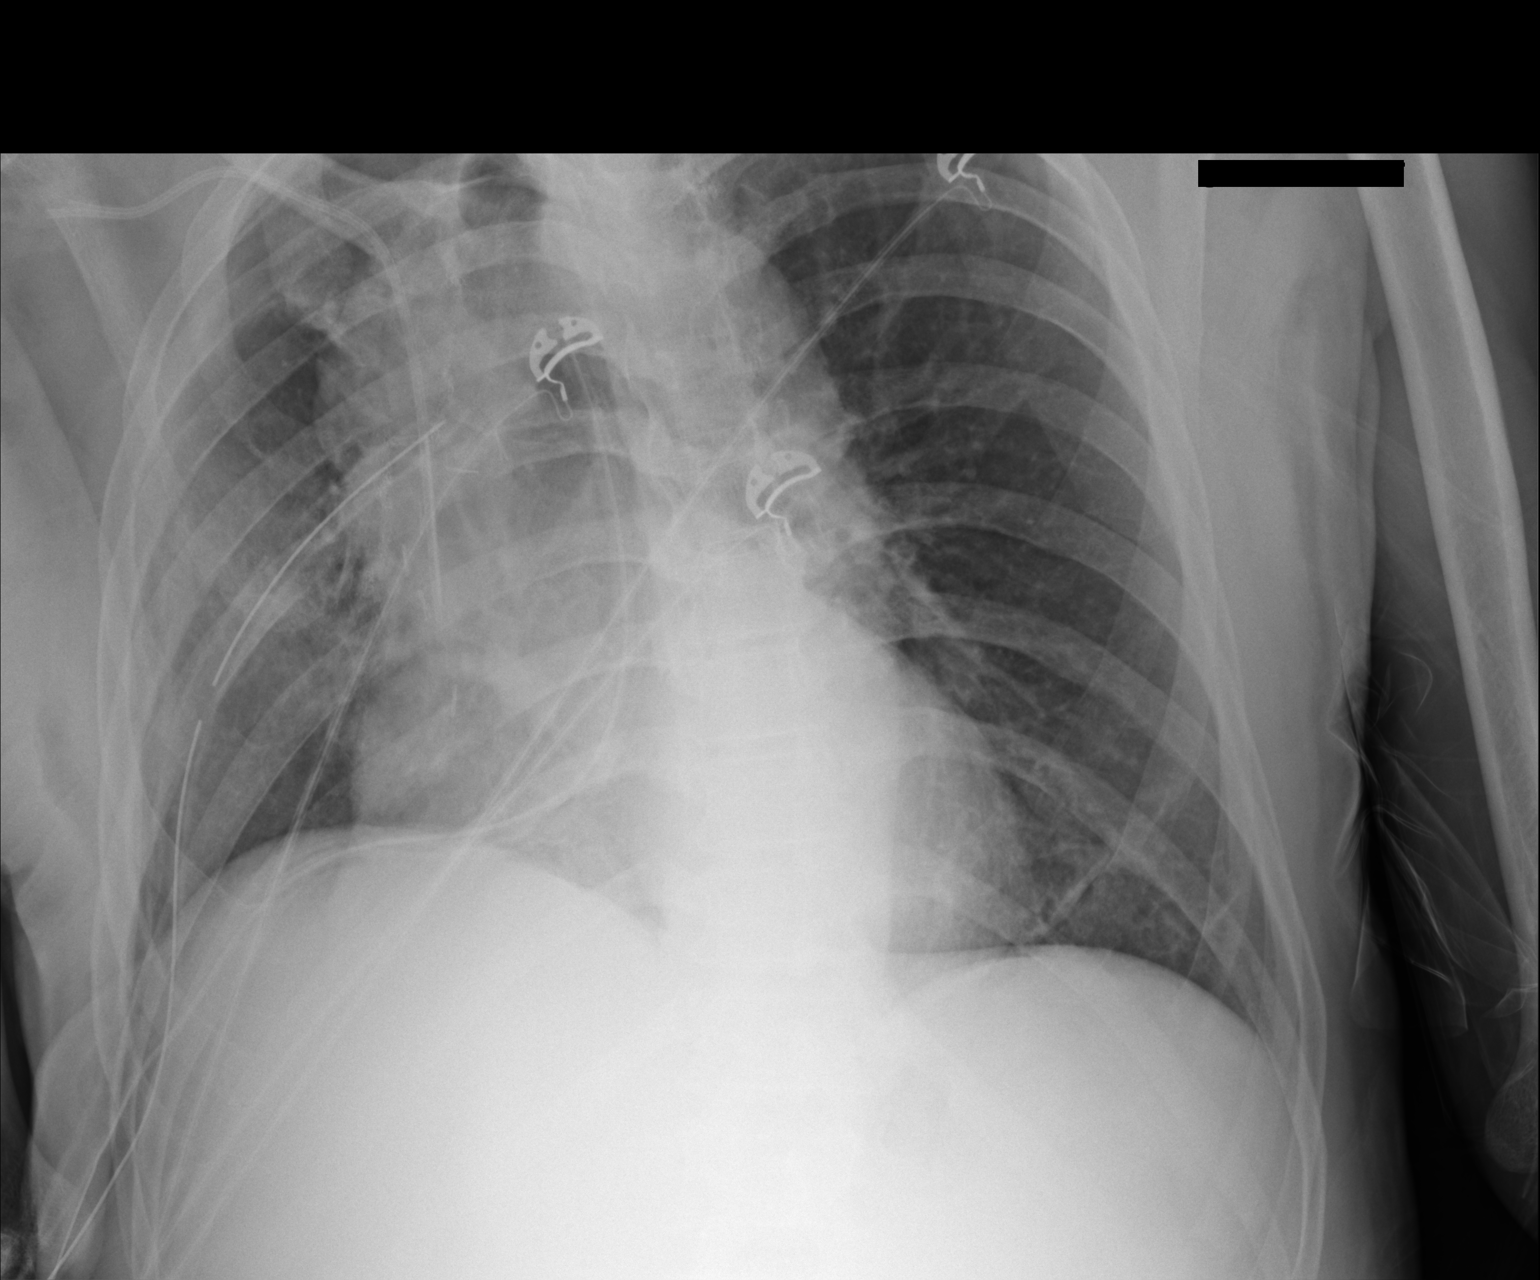

[2 of 2 positions shown; findings below may reference images not displayed]

FINDINGS: Patient is rotated to the left. Postsurgical change in the right
hemithorax with volume loss in surgical clips at the right hilum. A
right-sided chest tube is in place. No definite pneumothorax is
seen. Right central line tip projecting over the region of the SVC.
Ill-defined opacity in the right midlung zone, favor atelectasis.
Cardiomediastinal contours are unchanged. Minimal subsegmental
atelectasis at the left lung base. No large pleural effusion. No
pulmonary edema.
IMPRESSION: Postsurgical change in the right hemithorax with right-sided chest
tube in place. No definite pneumothorax. Ill-defined opacity in the
right midlung zone, favor atelectasis.

## 2016-11-24 IMAGING — CR DG CHEST 1V PORT
2 series · 2 of 2 positions shown · non-contrast
Comparison: September 04, 2015

CLINICAL DATA: Shortness of Breath

EXAM:
PORTABLE CHEST 1 VIEW

[AP (1 of 2)]
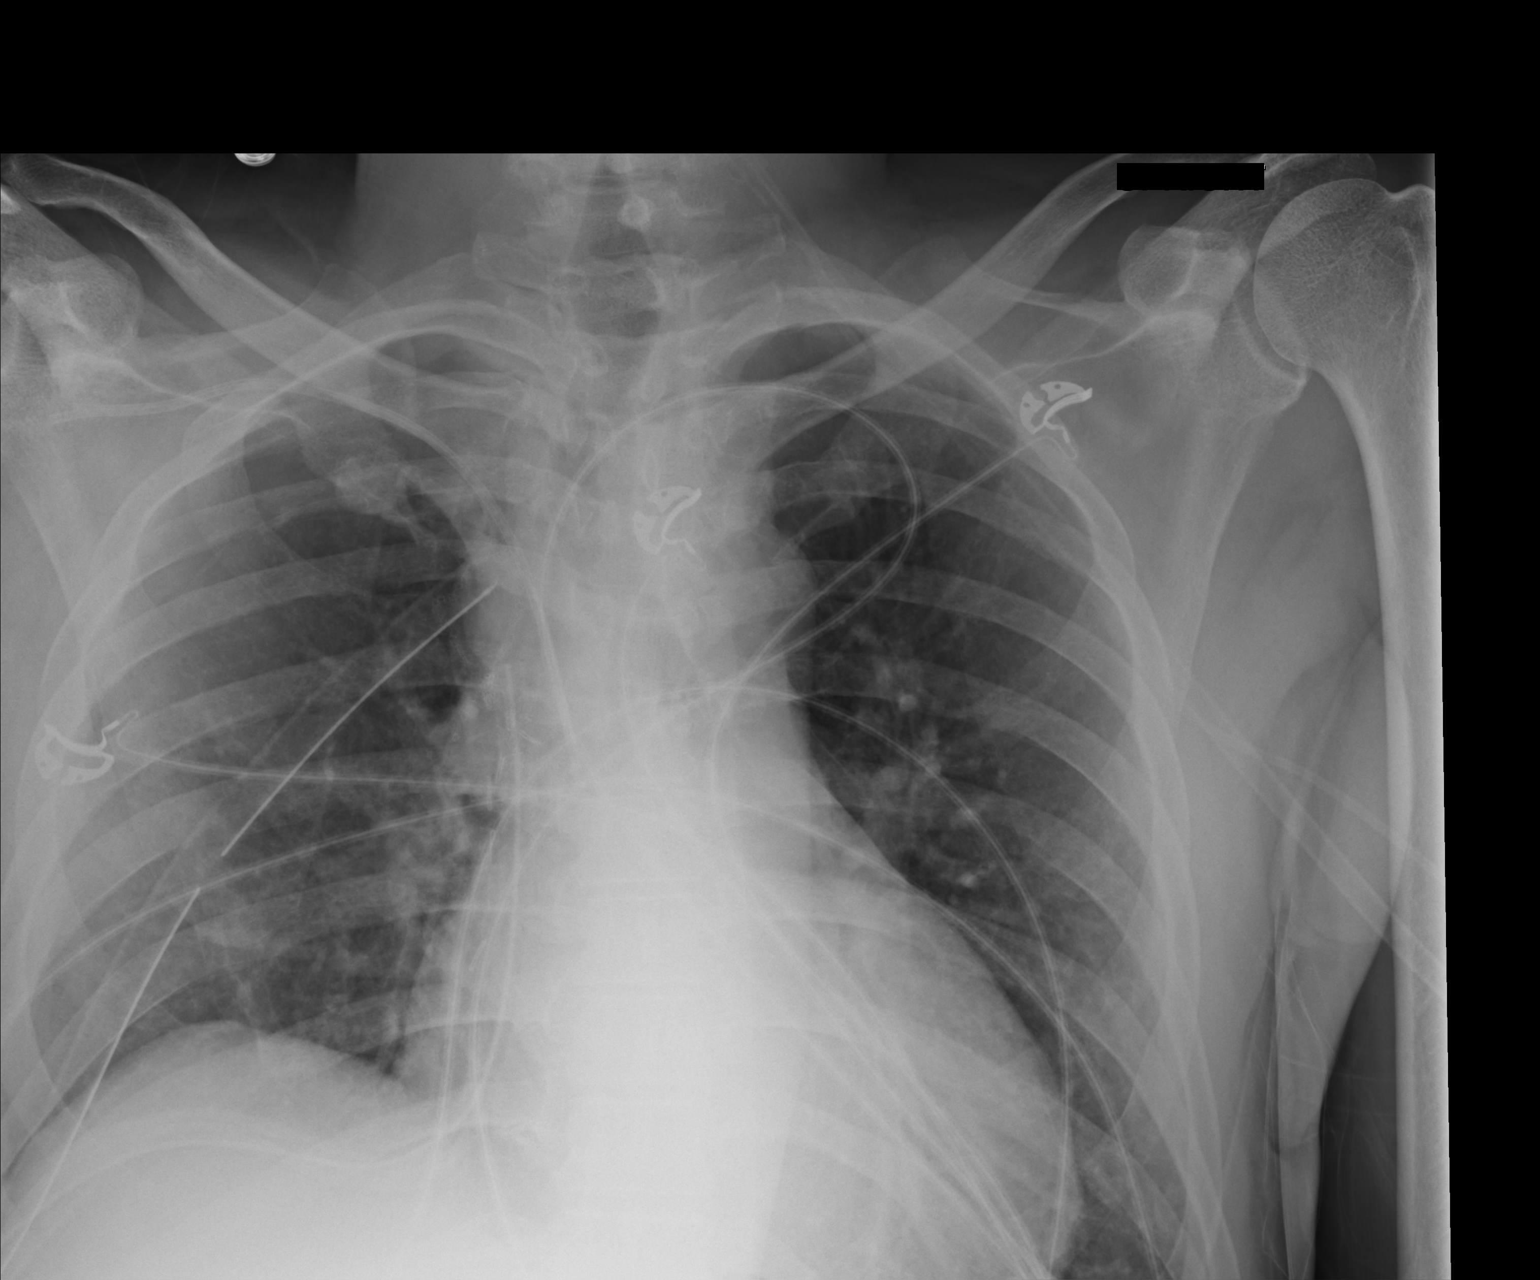

[AP (2 of 2)]
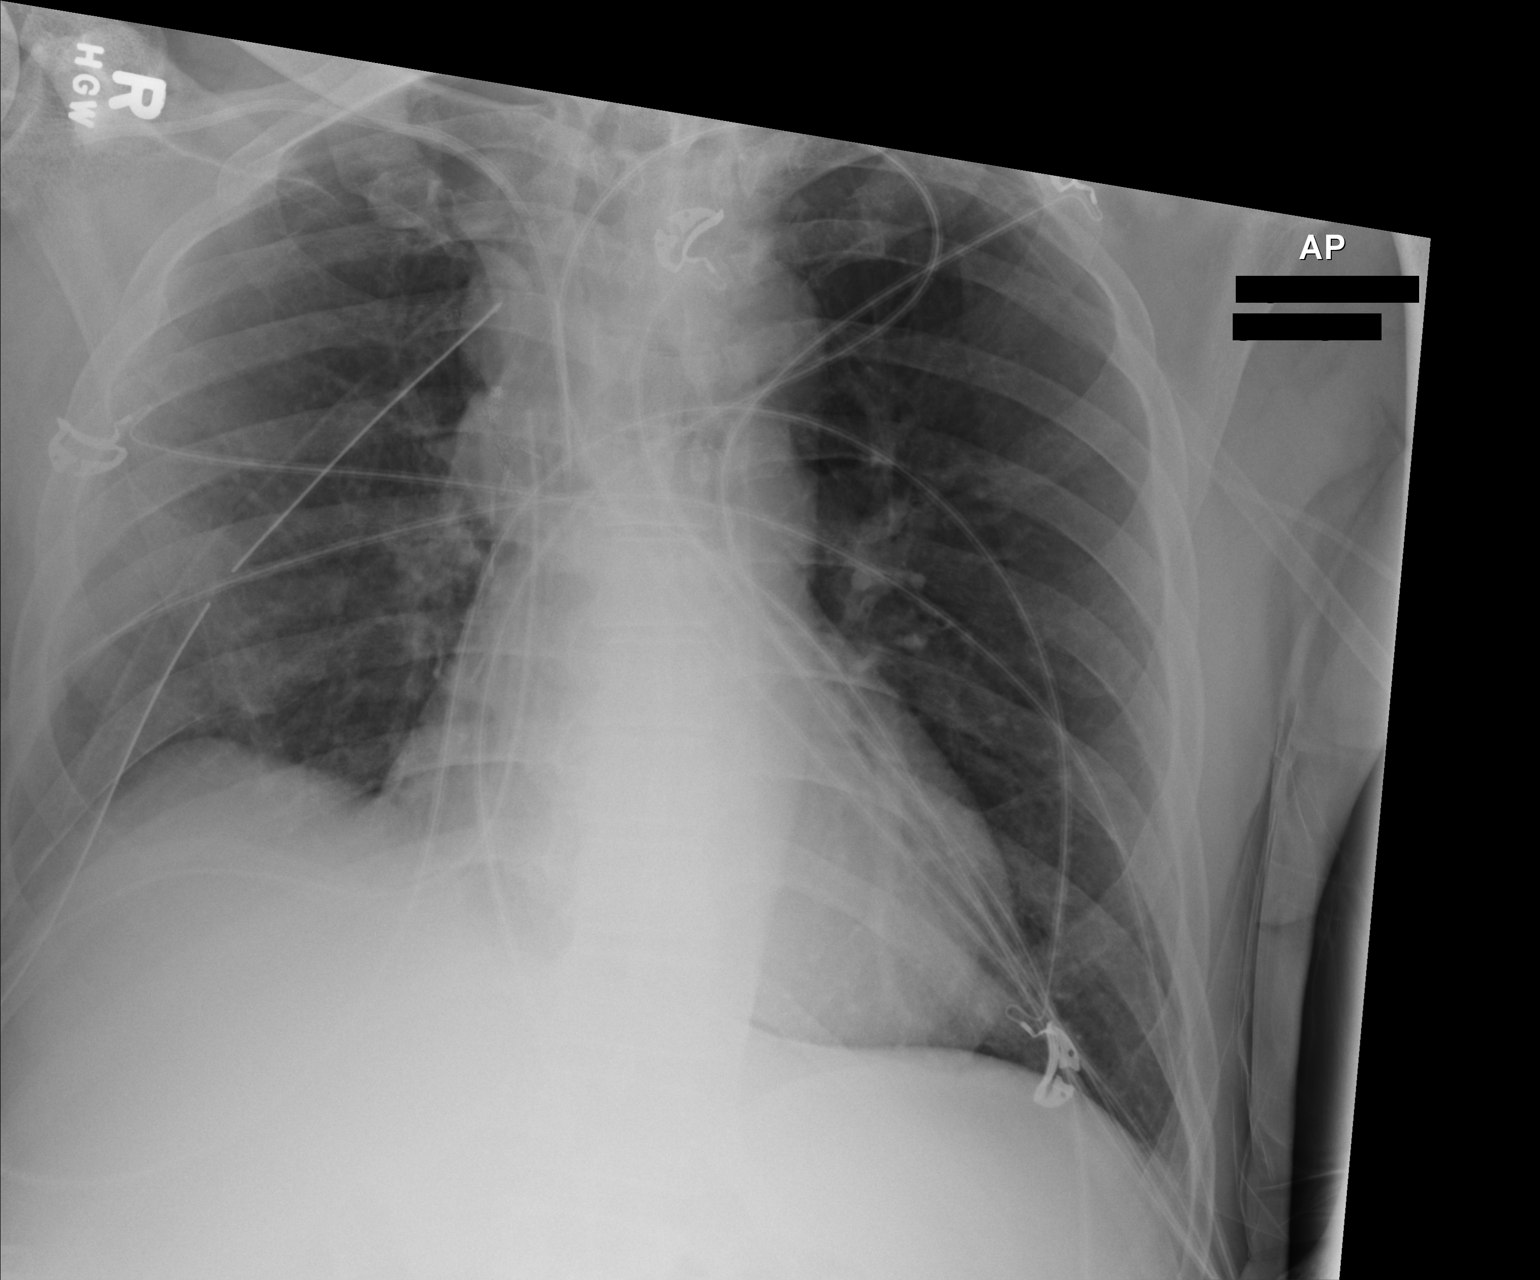

[2 of 2 positions shown; findings below may reference images not displayed]

FINDINGS: Central catheter tip is in the superior vena cava. There are chest
tubes on the right. There remains a fairly small right apicolateral
region pneumothorax without tension component. There is no edema or
consolidation. Heart size and pulmonary vascularity are normal. No
adenopathy.
IMPRESSION: Chest tubes remain on the right. There is a pneumothorax in the
right apicolateral region without tension component. No edema or
consolidation.

## 2016-11-25 IMAGING — CR DG CHEST 1V PORT
1 series · 1 of 1 positions shown · non-contrast
Comparison: 09/05/2015 and earlier.

CLINICAL DATA: Day 2 postop right upper lobectomy for lung cancer.

EXAM:
PORTABLE CHEST 1 VIEW

[AP]
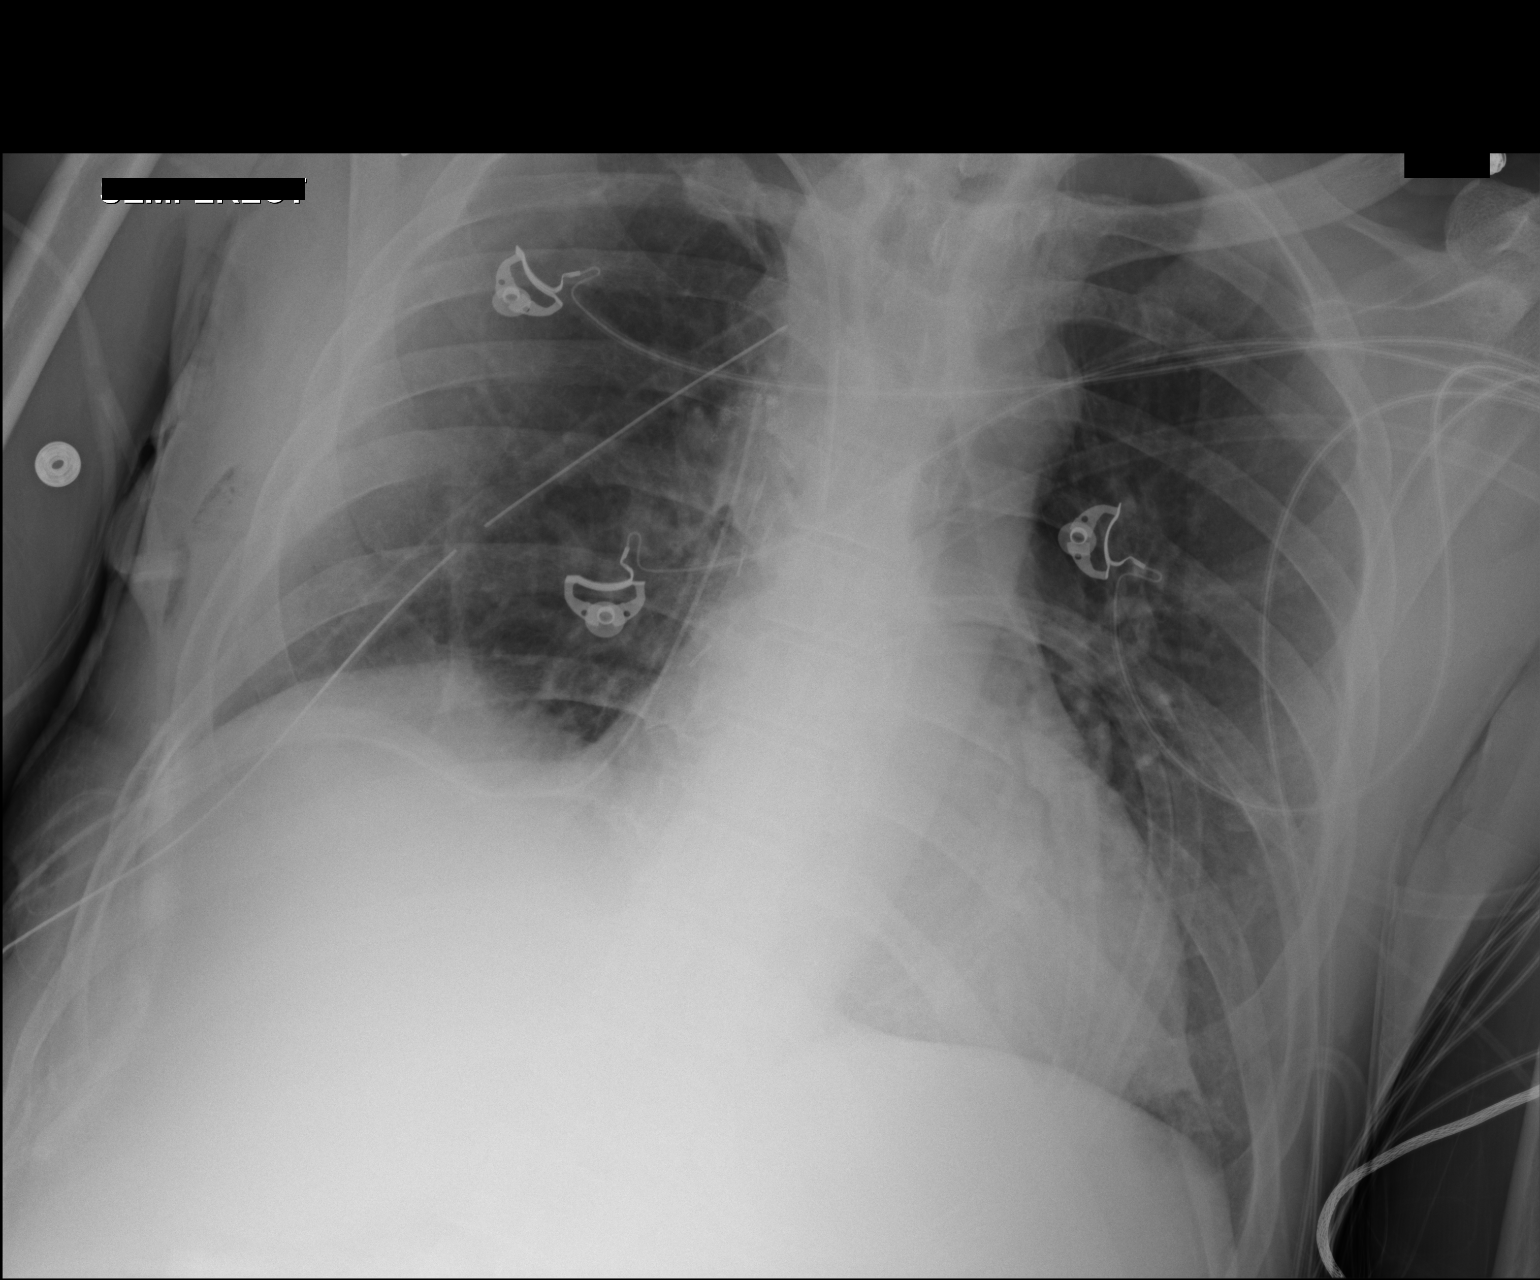

[1 of 1 positions shown; findings below may reference images not displayed]

FINDINGS: Postop changes related to right upper lobectomy. To right chest
tubes in place with no pneumothorax. Minimal linear atelectasis or
scar at the base of the remaining right lung. Left lung remains
clear. Cardiac silhouette normal in size.

Right subclavian central venous catheter tip projects over the upper
SVC.
IMPRESSION: 1. Support apparatus satisfactory.
2. No pneumothorax.
3. Minimal atelectasis or scar at the right lung base. No acute
cardiopulmonary disease otherwise.

## 2016-11-26 IMAGING — CR DG CHEST 1V PORT
1 series · 1 of 1 positions shown · non-contrast
Comparison: Portable chest x-ray September 06, 2015

CLINICAL DATA: Lung malignancy, status post wedge resection on the
right.

EXAM:
PORTABLE CHEST 1 VIEW

[AP]
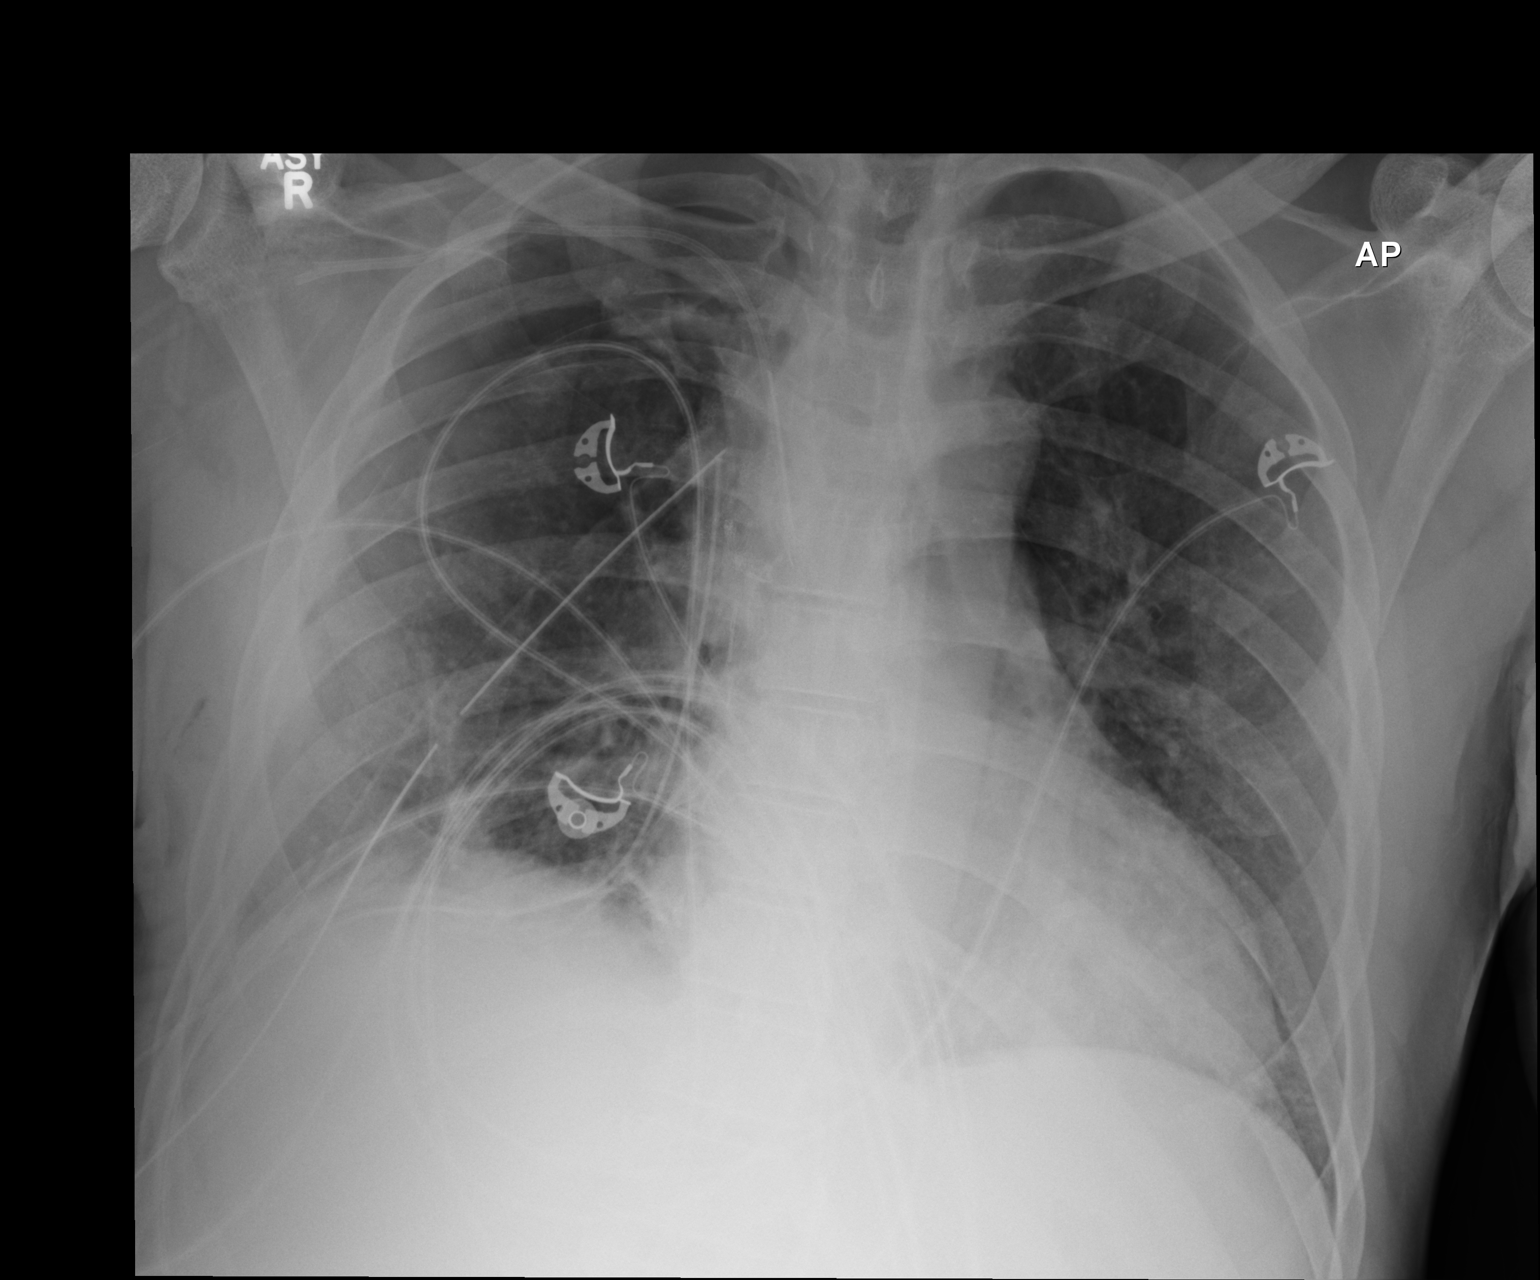

[1 of 1 positions shown; findings below may reference images not displayed]

FINDINGS: Since the previous study there has developed an approximately 15-20%
right-sided pneumothorax. The 2 right-sided chest tubes are
unchanged in position. There is no mediastinal shift. There is
minimal density at the left lung base. The cardiac silhouette is
top-normal in size. The pulmonary vascularity is not engorged. The
right subclavian venous catheter tip projects over the junction of
the proximal and middle thirds of the SVC.
IMPRESSION: There is a new approximately 20% right-sided pneumothorax. The
right-sided chest tubes are in stable position overlying the medial
aspect of the sixth rib posteriorly. Slight increased density at the
left lung base may reflect subsegmental atelectasis. There is no
evidence of CHF. Critical Value/emergent results were called by
telephone at the time of interpretation on 09/07/2015 at [DATE] to
Guoliang Miha, RN,, who verbally acknowledged these results. No
active disease.

## 2016-11-27 IMAGING — CR DG CHEST 1V PORT
1 series · 1 of 1 positions shown · non-contrast
Comparison: 09/07/2015.

CLINICAL DATA: History of right lung cancer. Prior right pulmonary
wedge resection.

EXAM:
PORTABLE CHEST 1 VIEW

[AP]
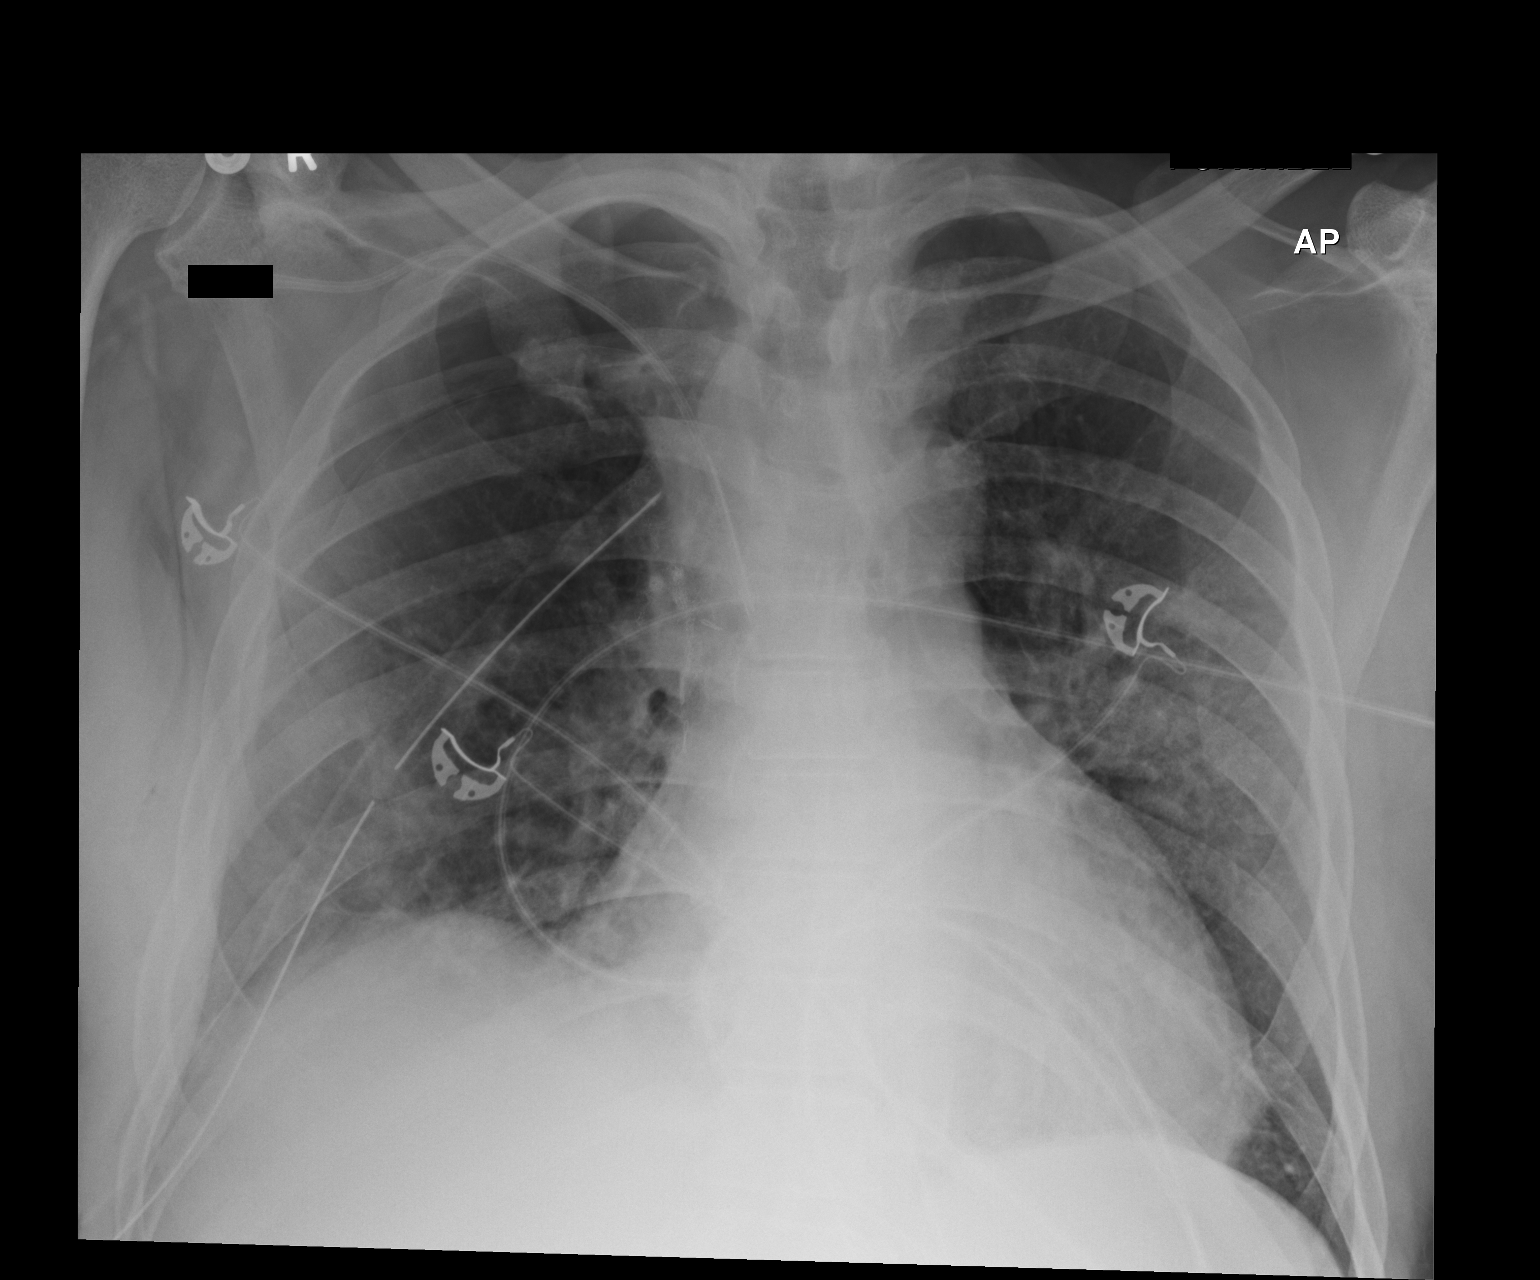

[1 of 1 positions shown; findings below may reference images not displayed]

FINDINGS: Right subclavian line and right chest tube in stable position.
Stable moderate right sided pneumothorax. Stable cardiomegaly.
Postsurgical changes right lung. Low lung volumes with basilar
atelectasis again noted. A developing left perihilar infiltrate
cannot be excluded. Mild right chest wall subcutaneous emphysema.
IMPRESSION: 1. Right chest tube in stable position. Stable moderate right-sided
pneumothorax. Right subclavian line in stable position.
2. Postsurgical changes right lung. Stable low lung volumes with
bibasilar atelectasis. A developing left perihilar infiltrate cannot
be excluded.
3. Stable cardiomegaly.  No pulmonary venous congestion.

## 2016-11-28 ENCOUNTER — Other Ambulatory Visit: Payer: Self-pay | Admitting: Internal Medicine

## 2016-11-28 DIAGNOSIS — F200 Paranoid schizophrenia: Secondary | ICD-10-CM

## 2016-11-28 IMAGING — CR DG CHEST 1V PORT
1 series · 1 of 1 positions shown · non-contrast
Comparison: 09/08/2015.  07/24/2015.  CT 08/06/2015 .

CLINICAL DATA: Lung cancer.

EXAM:
PORTABLE CHEST 1 VIEW

[AP]
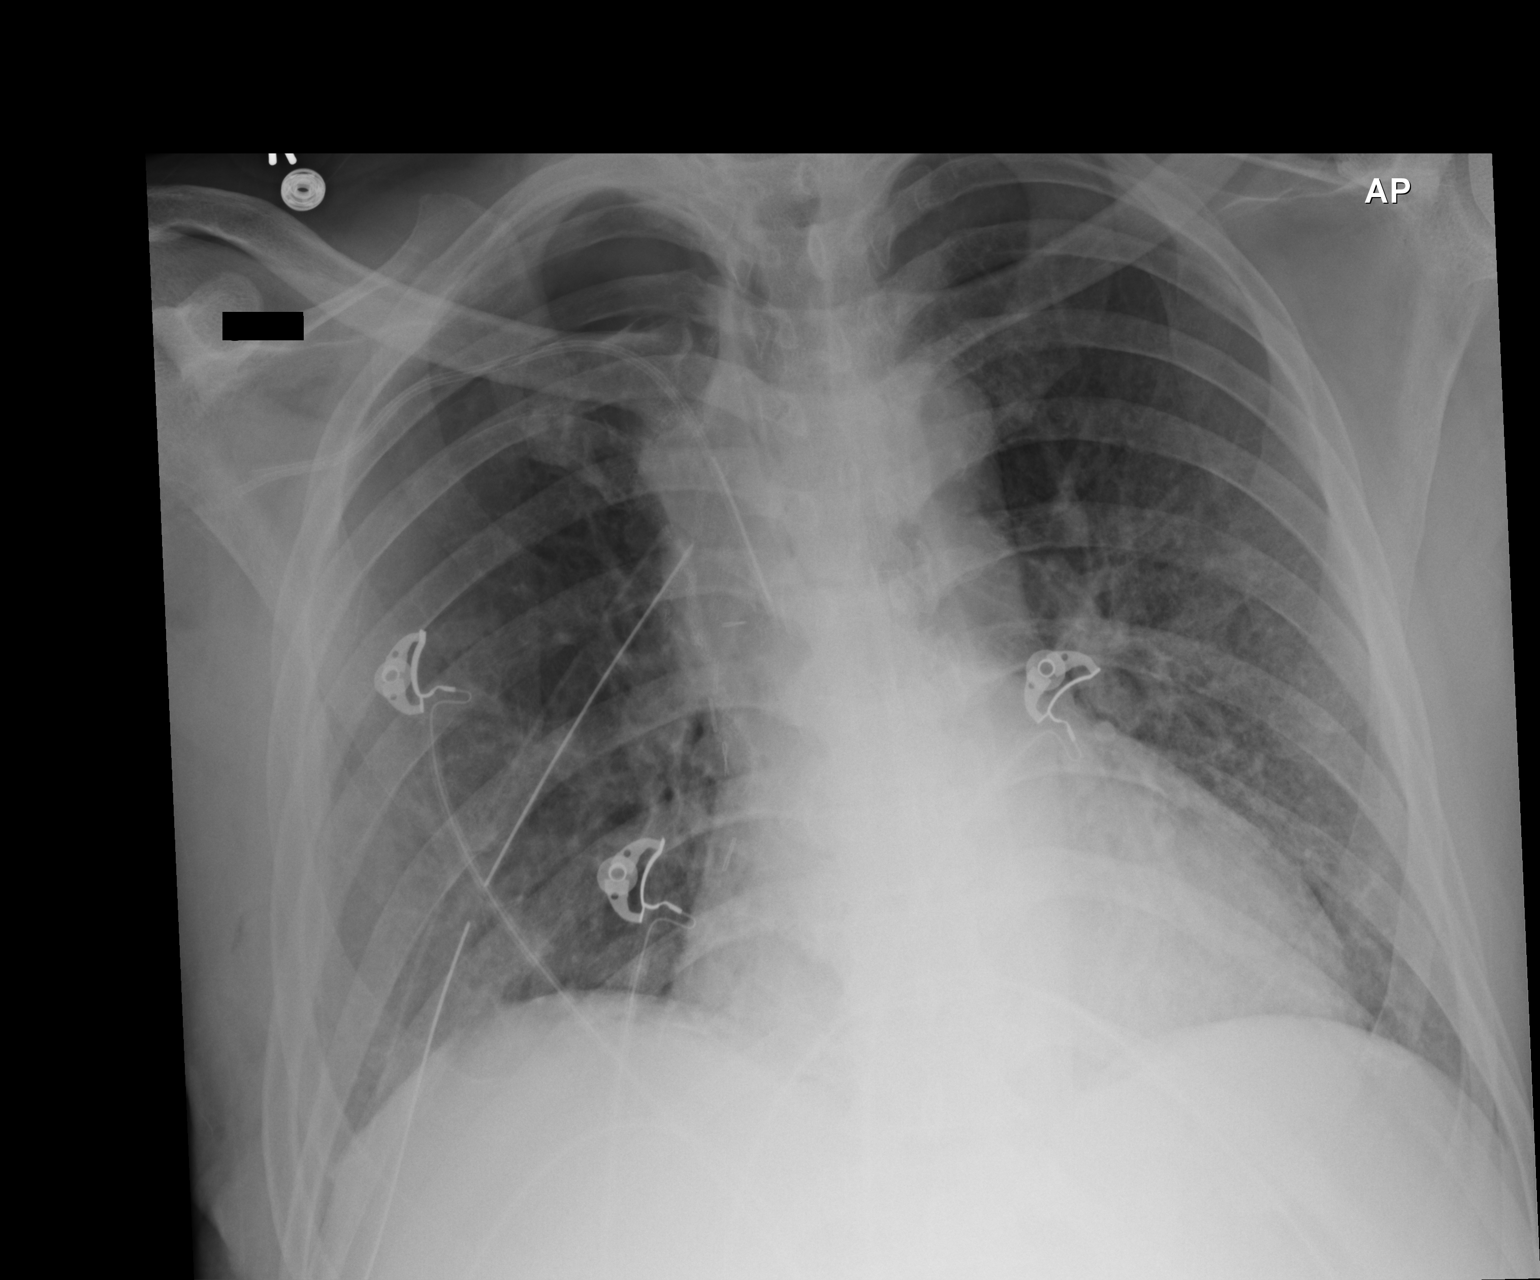

[1 of 1 positions shown; findings below may reference images not displayed]

FINDINGS: Right subclavian line in stable position . Right chest tube in
stable position. Stable moderate right pneumothorax. No interim
change. Mediastinum is stable. Postsurgical changes right lung. Low
lung volumes with mild basilar atelectasis. Mild left mid lung field
infiltrate cannot be excluded. Cardiomegaly. No pulmonary venous
congestion.
IMPRESSION: 1. Right subclavian line and right chest tube in stable position.
Stable moderate right pneumothorax. Postsurgical changes right lung.
2. Persistent low lung volumes with mild basilar atelectasis. Mild
infiltrate left mid lung field cannot be excluded.

## 2016-11-29 IMAGING — CR DG CHEST 1V PORT
1 series · 1 of 1 positions shown · non-contrast
Comparison: 09/09/2015 ; 09/08/2015; 09/05/2015

CLINICAL DATA: Post VATS / lobectomy.

EXAM:
PORTABLE CHEST 1 VIEW

[AP]
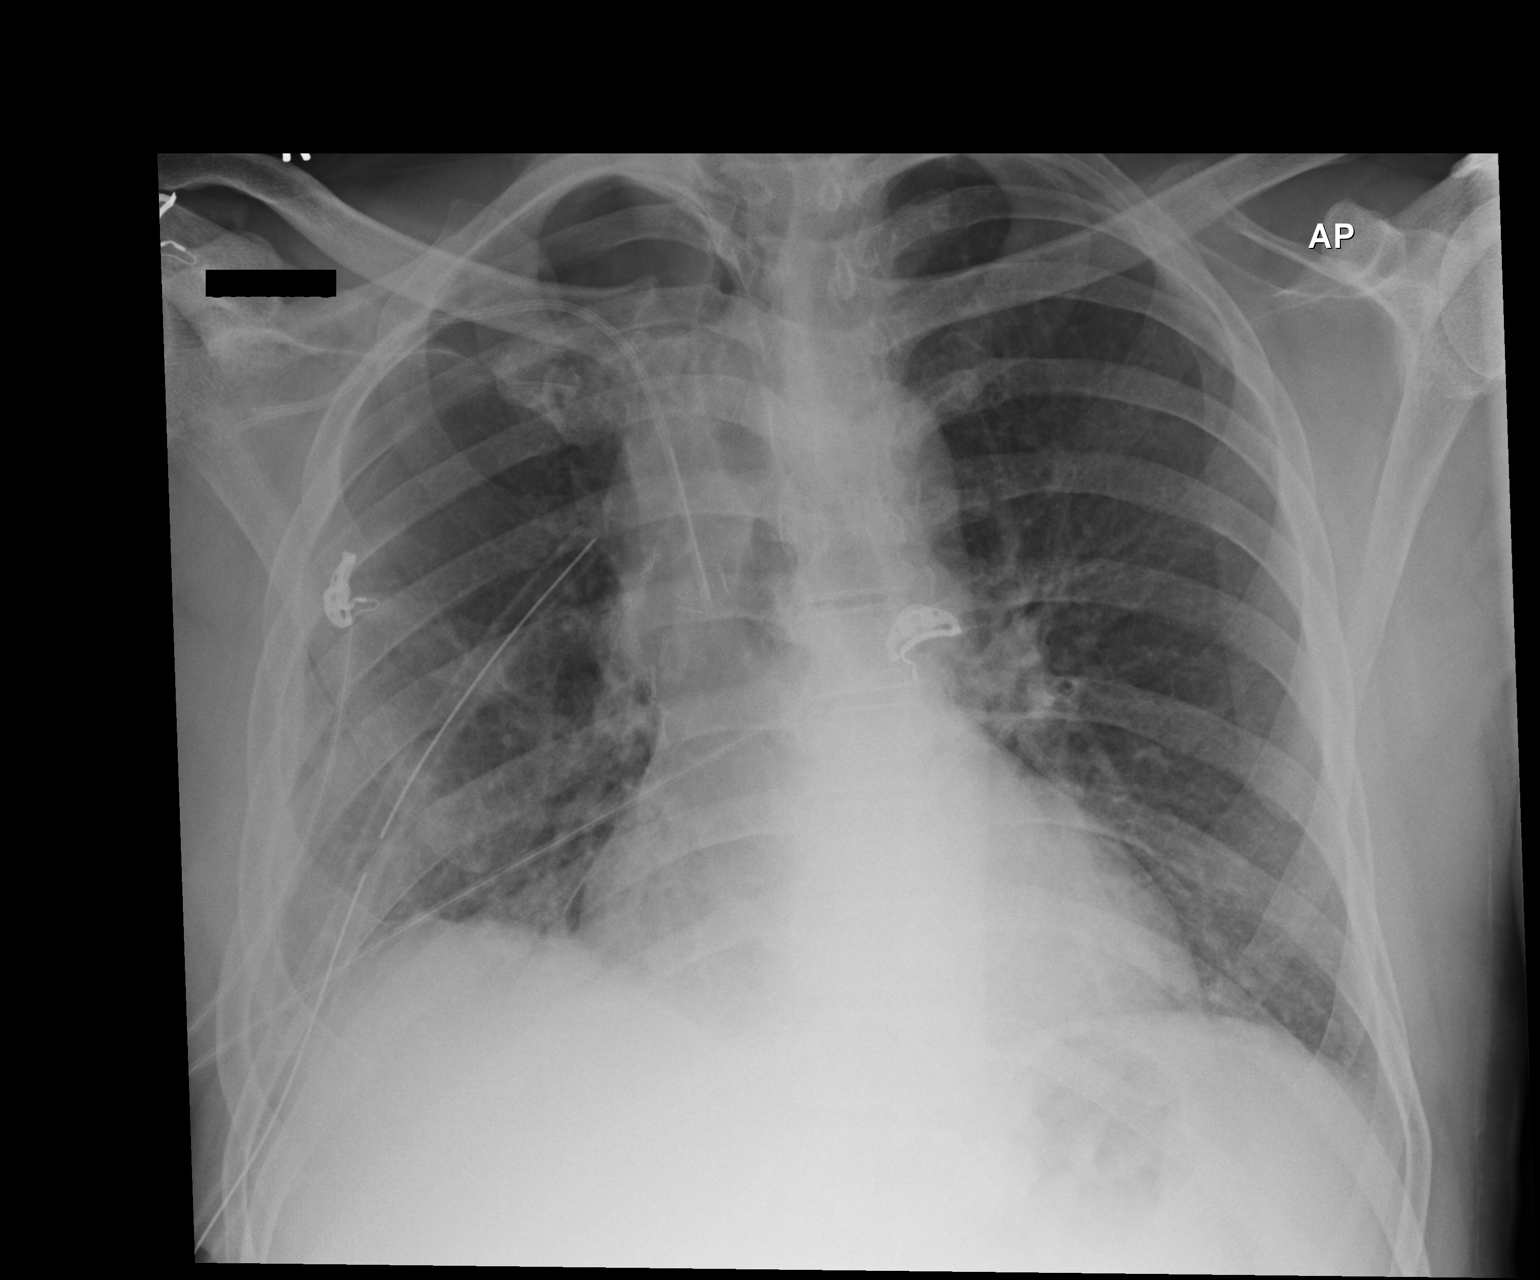

[1 of 1 positions shown; findings below may reference images not displayed]

FINDINGS: Grossly unchanged cardiac silhouette and mediastinal contours with
postsurgical change of the right hilum. Stable positioning of
support apparatus. Unchanged small right apical pneumothorax.
Grossly unchanged mild elevation of the right hemidiaphragm and
associated bibasilar opacities, right greater than left. No new
focal airspace opacities. Trace left-sided pleural effusion is not
excluded. No evidence of edema. Unchanged bones.
IMPRESSION: 1. Stable positioning of support apparatus. Unchanged small right
apical pneumothorax.
2. Grossly unchanged bibasilar atelectasis, right greater than left.

## 2016-11-30 ENCOUNTER — Ambulatory Visit (INDEPENDENT_AMBULATORY_CARE_PROVIDER_SITE_OTHER): Payer: Medicare Other | Admitting: Internal Medicine

## 2016-11-30 ENCOUNTER — Other Ambulatory Visit (INDEPENDENT_AMBULATORY_CARE_PROVIDER_SITE_OTHER): Payer: Medicare Other

## 2016-11-30 ENCOUNTER — Encounter: Payer: Self-pay | Admitting: Internal Medicine

## 2016-11-30 VITALS — BP 138/70 | HR 98 | Temp 98.5°F | Resp 16 | Ht 75.0 in | Wt 205.0 lb

## 2016-11-30 DIAGNOSIS — Z7251 High risk heterosexual behavior: Secondary | ICD-10-CM

## 2016-11-30 DIAGNOSIS — G4700 Insomnia, unspecified: Secondary | ICD-10-CM | POA: Diagnosis not present

## 2016-11-30 DIAGNOSIS — R351 Nocturia: Secondary | ICD-10-CM

## 2016-11-30 DIAGNOSIS — E785 Hyperlipidemia, unspecified: Secondary | ICD-10-CM | POA: Diagnosis not present

## 2016-11-30 DIAGNOSIS — G4731 Primary central sleep apnea: Secondary | ICD-10-CM | POA: Insufficient documentation

## 2016-11-30 DIAGNOSIS — N401 Enlarged prostate with lower urinary tract symptoms: Secondary | ICD-10-CM

## 2016-11-30 DIAGNOSIS — G4730 Sleep apnea, unspecified: Secondary | ICD-10-CM | POA: Diagnosis not present

## 2016-11-30 DIAGNOSIS — A530 Latent syphilis, unspecified as early or late: Secondary | ICD-10-CM | POA: Diagnosis not present

## 2016-11-30 DIAGNOSIS — I10 Essential (primary) hypertension: Secondary | ICD-10-CM

## 2016-11-30 DIAGNOSIS — I739 Peripheral vascular disease, unspecified: Secondary | ICD-10-CM

## 2016-11-30 DIAGNOSIS — R739 Hyperglycemia, unspecified: Secondary | ICD-10-CM

## 2016-11-30 DIAGNOSIS — Z0000 Encounter for general adult medical examination without abnormal findings: Secondary | ICD-10-CM

## 2016-11-30 LAB — TSH: TSH: 0.56 u[IU]/mL (ref 0.35–4.50)

## 2016-11-30 LAB — LIPID PANEL
CHOL/HDL RATIO: 5
Cholesterol: 184 mg/dL (ref 0–200)
HDL: 37.5 mg/dL — ABNORMAL LOW (ref >39.00)
NONHDL: 146.22
Triglycerides: 230 mg/dL — ABNORMAL HIGH (ref 0.0–149.0)
VLDL: 46 mg/dL — ABNORMAL HIGH (ref 0.0–40.0)

## 2016-11-30 LAB — COMPREHENSIVE METABOLIC PANEL
ALT: 31 U/L (ref 0–53)
AST: 23 U/L (ref 0–37)
Albumin: 4 g/dL (ref 3.5–5.2)
Alkaline Phosphatase: 77 U/L (ref 39–117)
BUN: 9 mg/dL (ref 6–23)
CO2: 27 meq/L (ref 19–32)
CREATININE: 0.97 mg/dL (ref 0.40–1.50)
Calcium: 9.7 mg/dL (ref 8.4–10.5)
Chloride: 107 mEq/L (ref 96–112)
GFR: 102.65 mL/min (ref >60.00)
GLUCOSE: 106 mg/dL — AB (ref 70–99)
Potassium: 4.3 mEq/L (ref 3.5–5.1)
SODIUM: 138 meq/L (ref 135–145)
Total Bilirubin: 0.6 mg/dL (ref 0.2–1.2)
Total Protein: 7.4 g/dL (ref 6.0–8.3)

## 2016-11-30 LAB — HEMOGLOBIN A1C: Hgb A1c MFr Bld: 6.2 % (ref 4.6–6.5)

## 2016-11-30 LAB — PSA: PSA: 0.6 ng/mL (ref 0.10–4.00)

## 2016-11-30 LAB — LDL CHOLESTEROL, DIRECT: Direct LDL: 122 mg/dL

## 2016-11-30 IMAGING — CR DG CHEST 1V PORT
1 series · 1 of 1 positions shown · non-contrast
Comparison: September 10, 2015.

CLINICAL DATA: Pneumothorax ; non-small-cell carcinoma of lung.

EXAM:
PORTABLE CHEST 1 VIEW

[AP]
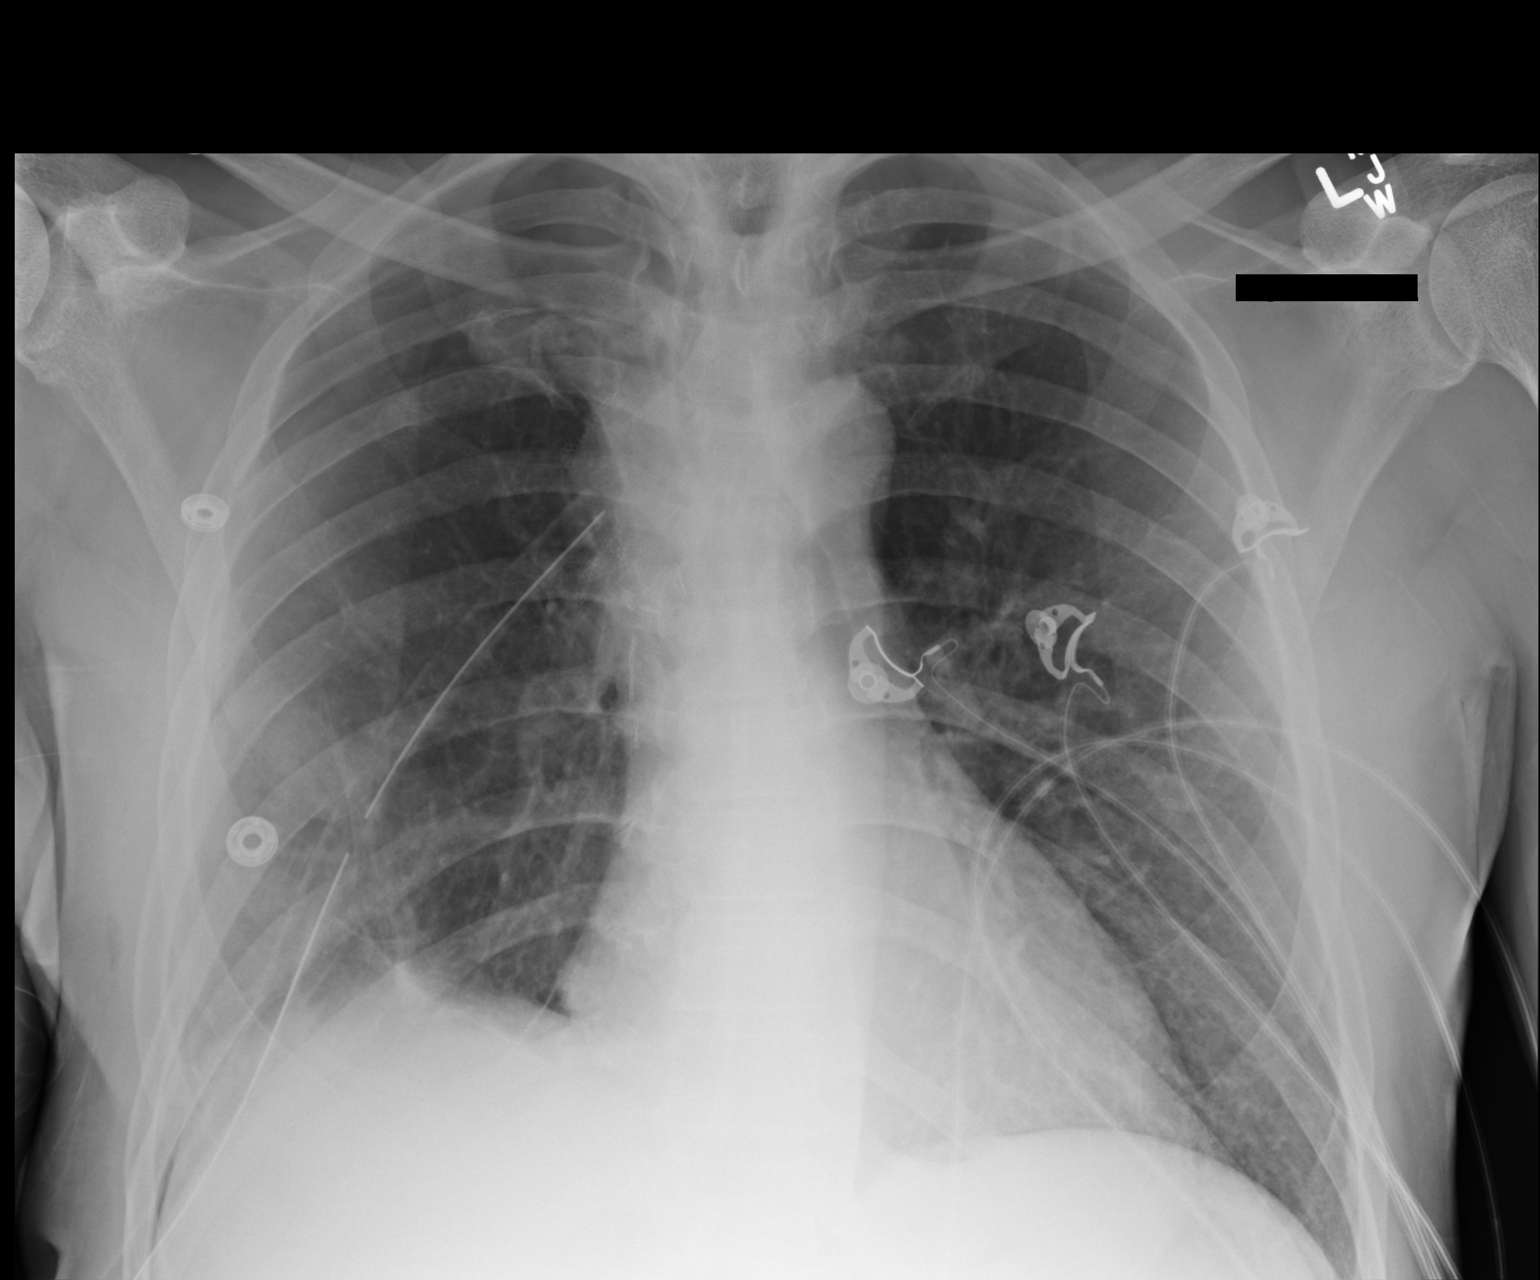

[1 of 1 positions shown; findings below may reference images not displayed]

FINDINGS: The heart size and mediastinal contours are within normal limits.
Left lung is clear. Right subclavian catheter line has been removed.
Right-sided chest tube is again noted and unchanged in position.
Stable mild right apical pneumothorax is noted. Stable atelectasis
or scarring is noted in right lung base. The visualized skeletal
structures are unremarkable.
IMPRESSION: Stable mild right apical pneumothorax. Right-sided chest tube is
unchanged in position. Stable right basilar atelectasis or scarring
is noted.

## 2016-11-30 MED ORDER — ESZOPICLONE 2 MG PO TABS: 2.0000 mg | ORAL_TABLET | Freq: Every evening | ORAL | 3 refills | Status: DC | PRN

## 2016-11-30 NOTE — Progress Notes (Signed)
Pre visit review using our clinic review tool, if applicable. No additional management support is needed unless otherwise documented below in the visit note. 
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
## 2016-11-30 NOTE — Patient Instructions (Signed)
 Health Maintenance, Male A healthy lifestyle and preventive care is important for your health and wellness. Ask your health care provider about what schedule of regular examinations is right for you. What should I know about weight and diet?  Eat a Healthy Diet  Eat plenty of vegetables, fruits, whole grains, low-fat dairy products, and lean protein.  Do not eat a lot of foods high in solid fats, added sugars, or salt. Maintain a Healthy Weight  Regular exercise can help you achieve or maintain a healthy weight. You should:  Do at least 150 minutes of exercise each week. The exercise should increase your heart rate and make you sweat (moderate-intensity exercise).  Do strength-training exercises at least twice a week. Watch Your Levels of Cholesterol and Blood Lipids  Have your blood tested for lipids and cholesterol every 5 years starting at 57 years of age. If you are at high risk for heart disease, you should start having your blood tested when you are 57 years old. You may need to have your cholesterol levels checked more often if:  Your lipid or cholesterol levels are high.  You are older than 57 years of age.  You are at high risk for heart disease. What should I know about cancer screening? Many types of cancers can be detected early and may often be prevented. Lung Cancer  You should be screened every year for lung cancer if:  You are a current smoker who has smoked for at least 30 years.  You are a former smoker who has quit within the past 15 years.  Talk to your health care provider about your screening options, when you should start screening, and how often you should be screened. Colorectal Cancer  Routine colorectal cancer screening usually begins at 57 years of age and should be repeated every 5-10 years until you are 57 years old. You may need to be screened more often if early forms of precancerous polyps or small growths are found. Your health care provider  may recommend screening at an earlier age if you have risk factors for colon cancer.  Your health care provider may recommend using home test kits to check for hidden blood in the stool.  A small camera at the end of a tube can be used to examine your colon (sigmoidoscopy or colonoscopy). This checks for the earliest forms of colorectal cancer. Prostate and Testicular Cancer  Depending on your age and overall health, your health care provider may do certain tests to screen for prostate and testicular cancer.  Talk to your health care provider about any symptoms or concerns you have about testicular or prostate cancer. Skin Cancer  Check your skin from head to toe regularly.  Tell your health care provider about any new moles or changes in moles, especially if:  There is a change in a mole's size, shape, or color.  You have a mole that is larger than a pencil eraser.  Always use sunscreen. Apply sunscreen liberally and repeat throughout the day.  Protect yourself by wearing long sleeves, pants, a wide-brimmed hat, and sunglasses when outside. What should I know about heart disease, diabetes, and high blood pressure?  If you are 18-39 years of age, have your blood pressure checked every 3-5 years. If you are 40 years of age or older, have your blood pressure checked every year. You should have your blood pressure measured twice-once when you are at a hospital or clinic, and once when you are not at   a hospital or clinic. Record the average of the two measurements. To check your blood pressure when you are not at a hospital or clinic, you can use:  An automated blood pressure machine at a pharmacy.  A home blood pressure monitor.  Talk to your health care provider about your target blood pressure.  If you are between 45-79 years old, ask your health care provider if you should take aspirin to prevent heart disease.  Have regular diabetes screenings by checking your fasting blood sugar  level.  If you are at a normal weight and have a low risk for diabetes, have this test once every three years after the age of 45.  If you are overweight and have a high risk for diabetes, consider being tested at a younger age or more often.  A one-time screening for abdominal aortic aneurysm (AAA) by ultrasound is recommended for men aged 65-75 years who are current or former smokers. What should I know about preventing infection? Hepatitis B  If you have a higher risk for hepatitis B, you should be screened for this virus. Talk with your health care provider to find out if you are at risk for hepatitis B infection. Hepatitis C  Blood testing is recommended for:  Everyone born from 1945 through 1965.  Anyone with known risk factors for hepatitis C. Sexually Transmitted Diseases (STDs)  You should be screened each year for STDs including gonorrhea and chlamydia if:  You are sexually active and are younger than 57 years of age.  You are older than 57 years of age and your health care provider tells you that you are at risk for this type of infection.  Your sexual activity has changed since you were last screened and you are at an increased risk for chlamydia or gonorrhea. Ask your health care provider if you are at risk.  Talk with your health care provider about whether you are at high risk of being infected with HIV. Your health care provider may recommend a prescription medicine to help prevent HIV infection. What else can I do?  Schedule regular health, dental, and eye exams.  Stay current with your vaccines (immunizations).  Do not use any tobacco products, such as cigarettes, chewing tobacco, and e-cigarettes. If you need help quitting, ask your health care provider.  Limit alcohol intake to no more than 2 drinks per day. One drink equals 12 ounces of beer, 5 ounces of wine, or 1 ounces of hard liquor.  Do not use street drugs.  Do not share needles.  Ask your health  care provider for help if you need support or information about quitting drugs.  Tell your health care provider if you often feel depressed.  Tell your health care provider if you have ever been abused or do not feel safe at home. This information is not intended to replace advice given to you by your health care provider. Make sure you discuss any questions you have with your health care provider. Document Released: 03/10/2008 Document Revised: 05/11/2016 Document Reviewed: 06/16/2015 Elsevier Interactive Patient Education  2017 [employer]  

## 2016-12-01 ENCOUNTER — Telehealth: Payer: Self-pay

## 2016-12-01 ENCOUNTER — Encounter: Payer: Self-pay | Admitting: Internal Medicine

## 2016-12-01 LAB — RPR

## 2016-12-01 LAB — HIV ANTIBODY (ROUTINE TESTING W REFLEX): HIV: NONREACTIVE

## 2016-12-01 LAB — HEPATITIS B SURFACE ANTIGEN: HEP B S AG: NEGATIVE

## 2016-12-01 IMAGING — CR DG CHEST 1V PORT
1 series · 1 of 1 positions shown · non-contrast
Comparison: Chest radiograph 09/11/2015.

CLINICAL DATA: Patient with history of non-small cell lung
carcinoma.

EXAM:
PORTABLE CHEST 1 VIEW

[AP]
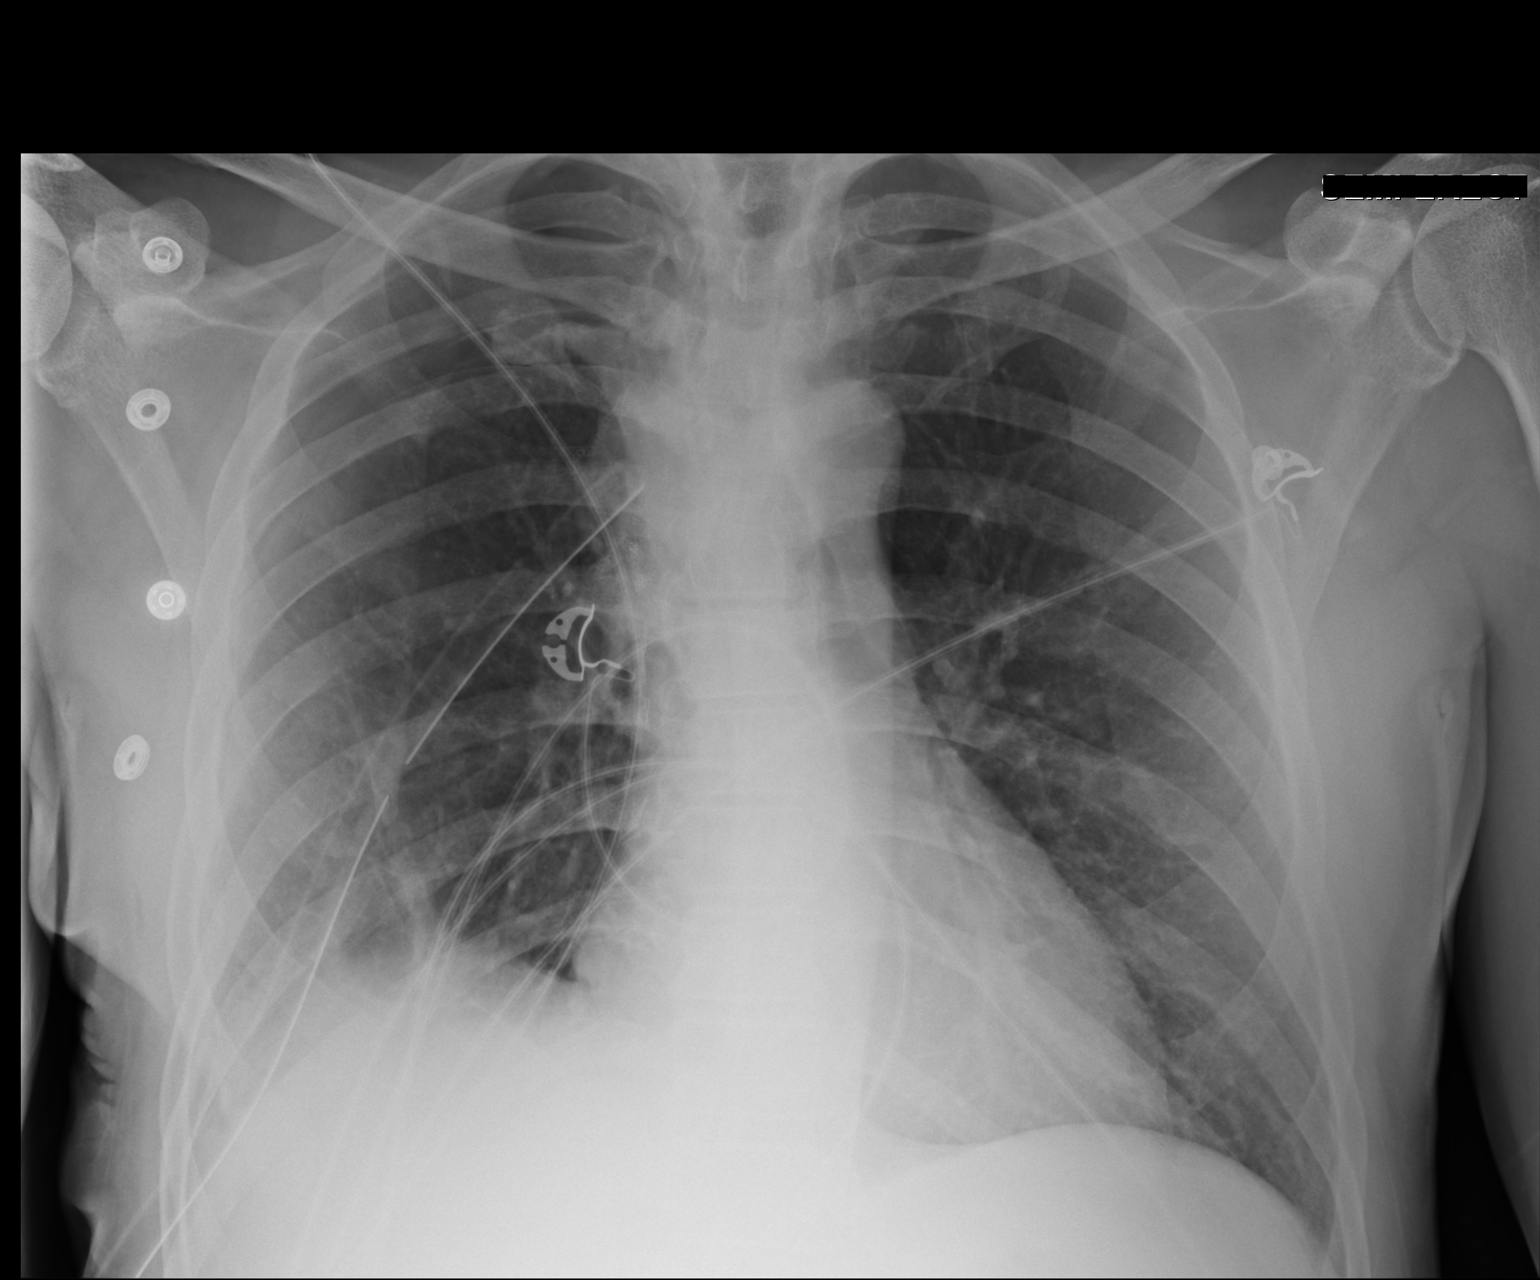

[1 of 1 positions shown; findings below may reference images not displayed]

FINDINGS: Monitoring leads overlie the patient. Right-sided chest tube is
stable in position. Stable cardiac and mediastinal contours.
Persistent moderate right pneumothorax. Unchanged heterogeneous
opacities right lung base. Left lung is clear. Possible small amount
of right-sided pleural fluid.
IMPRESSION: Unchanged moderate right apical pneumothorax. Right chest tube
remains in position.

Small right basilar atelectasis. Possible small right pleural
effusion.

## 2016-12-01 MED ORDER — EMTRICITABINE-TENOFOVIR DF 200-300 MG PO TABS: 1.0000 | ORAL_TABLET | Freq: Every day | ORAL | 3 refills | Status: DC

## 2016-12-01 NOTE — Telephone Encounter (Signed)
PA started for Eszopiclone [name]). Key: Daron Offer

## 2016-12-01 NOTE — Telephone Encounter (Signed)
PA approved for lunesta Patient contacted

## 2016-12-04 NOTE — Assessment & Plan Note (Signed)

## 2016-12-06 IMAGING — CR DG CHEST 2V SAME DAY
2 series · 2 of 2 positions shown · non-contrast
Comparison: PA and lateral chest x-ray of earlier today

CLINICAL DATA: Chest tube removal, history of lung malignancy

EXAM:
CHEST  2 VIEW

[w chest pa]
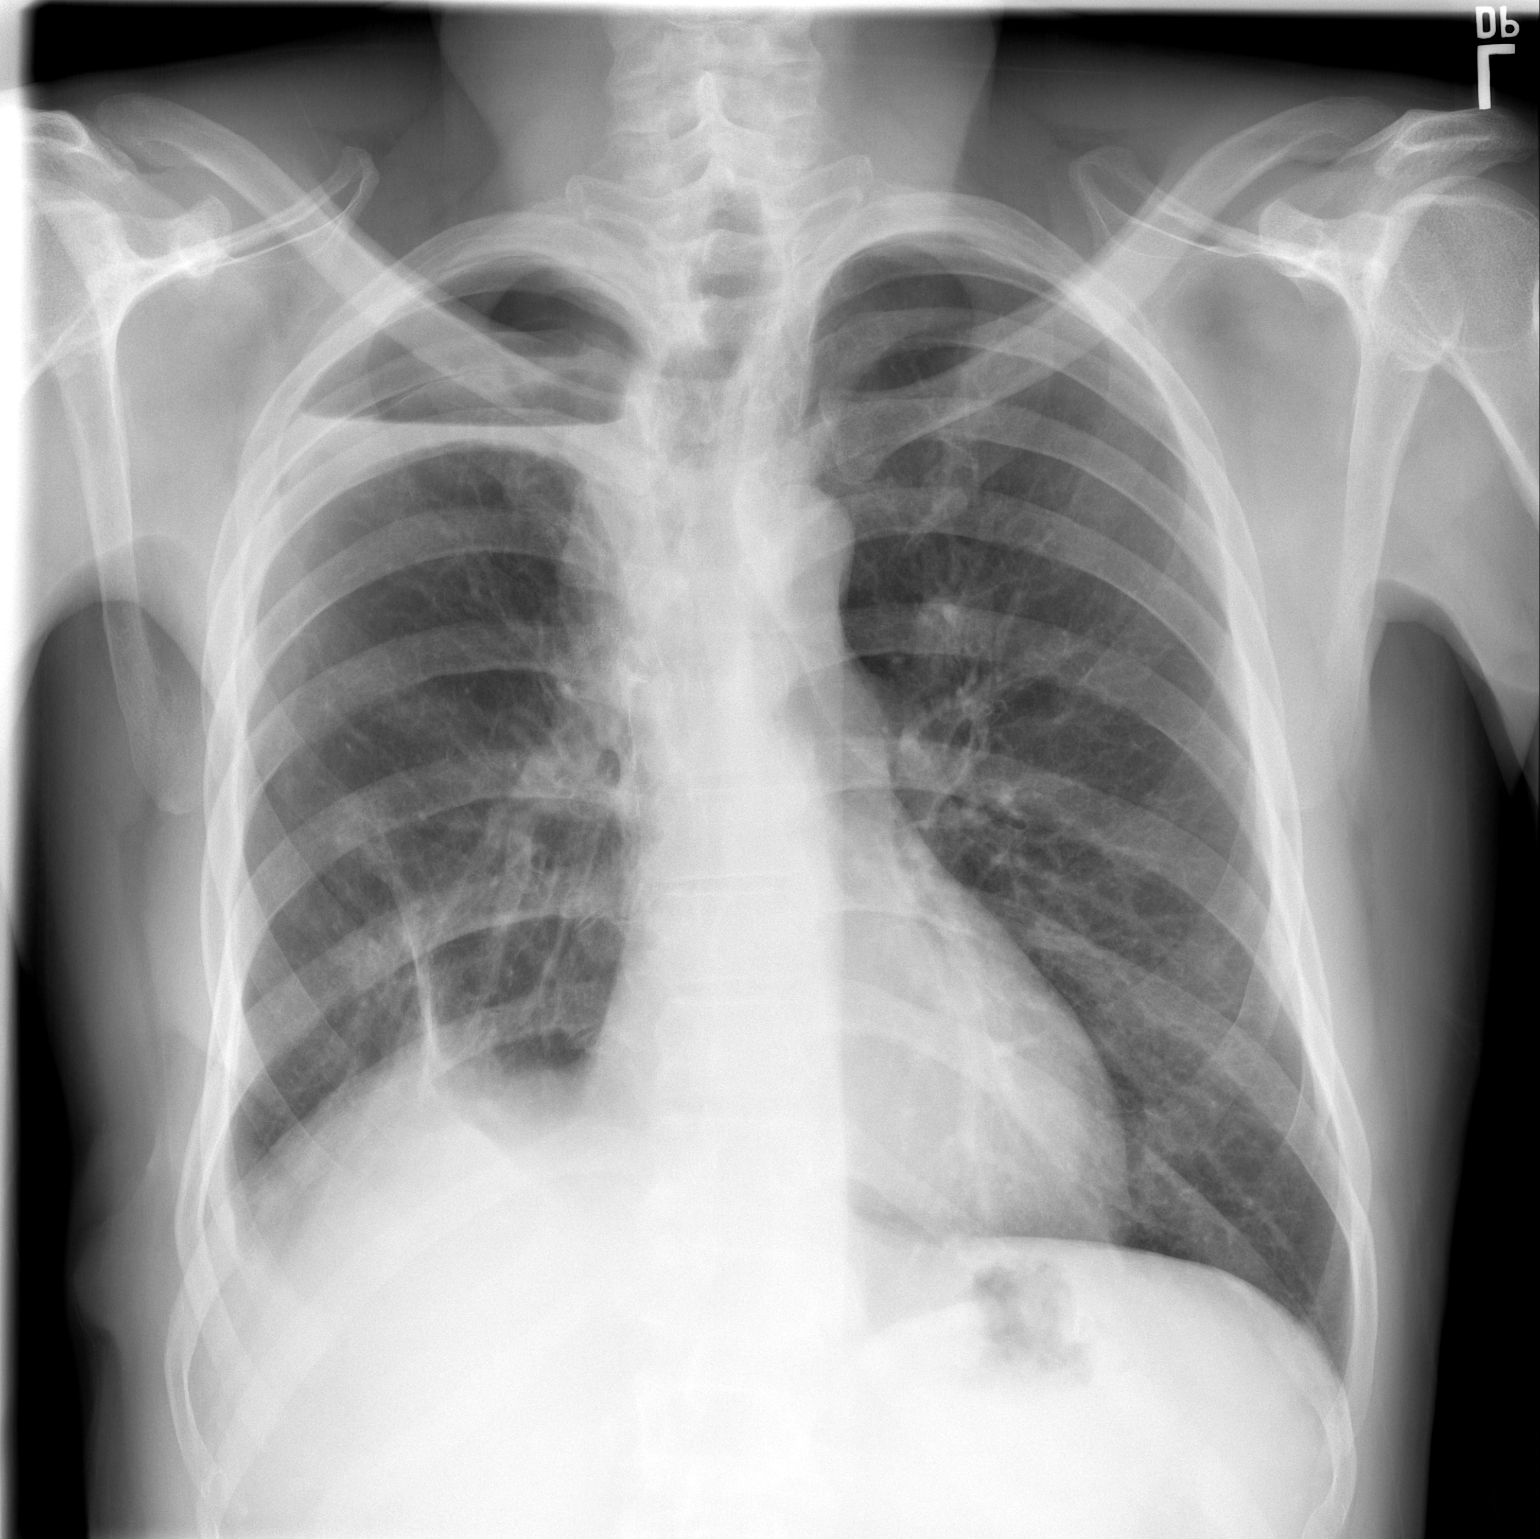

[w chest lat]
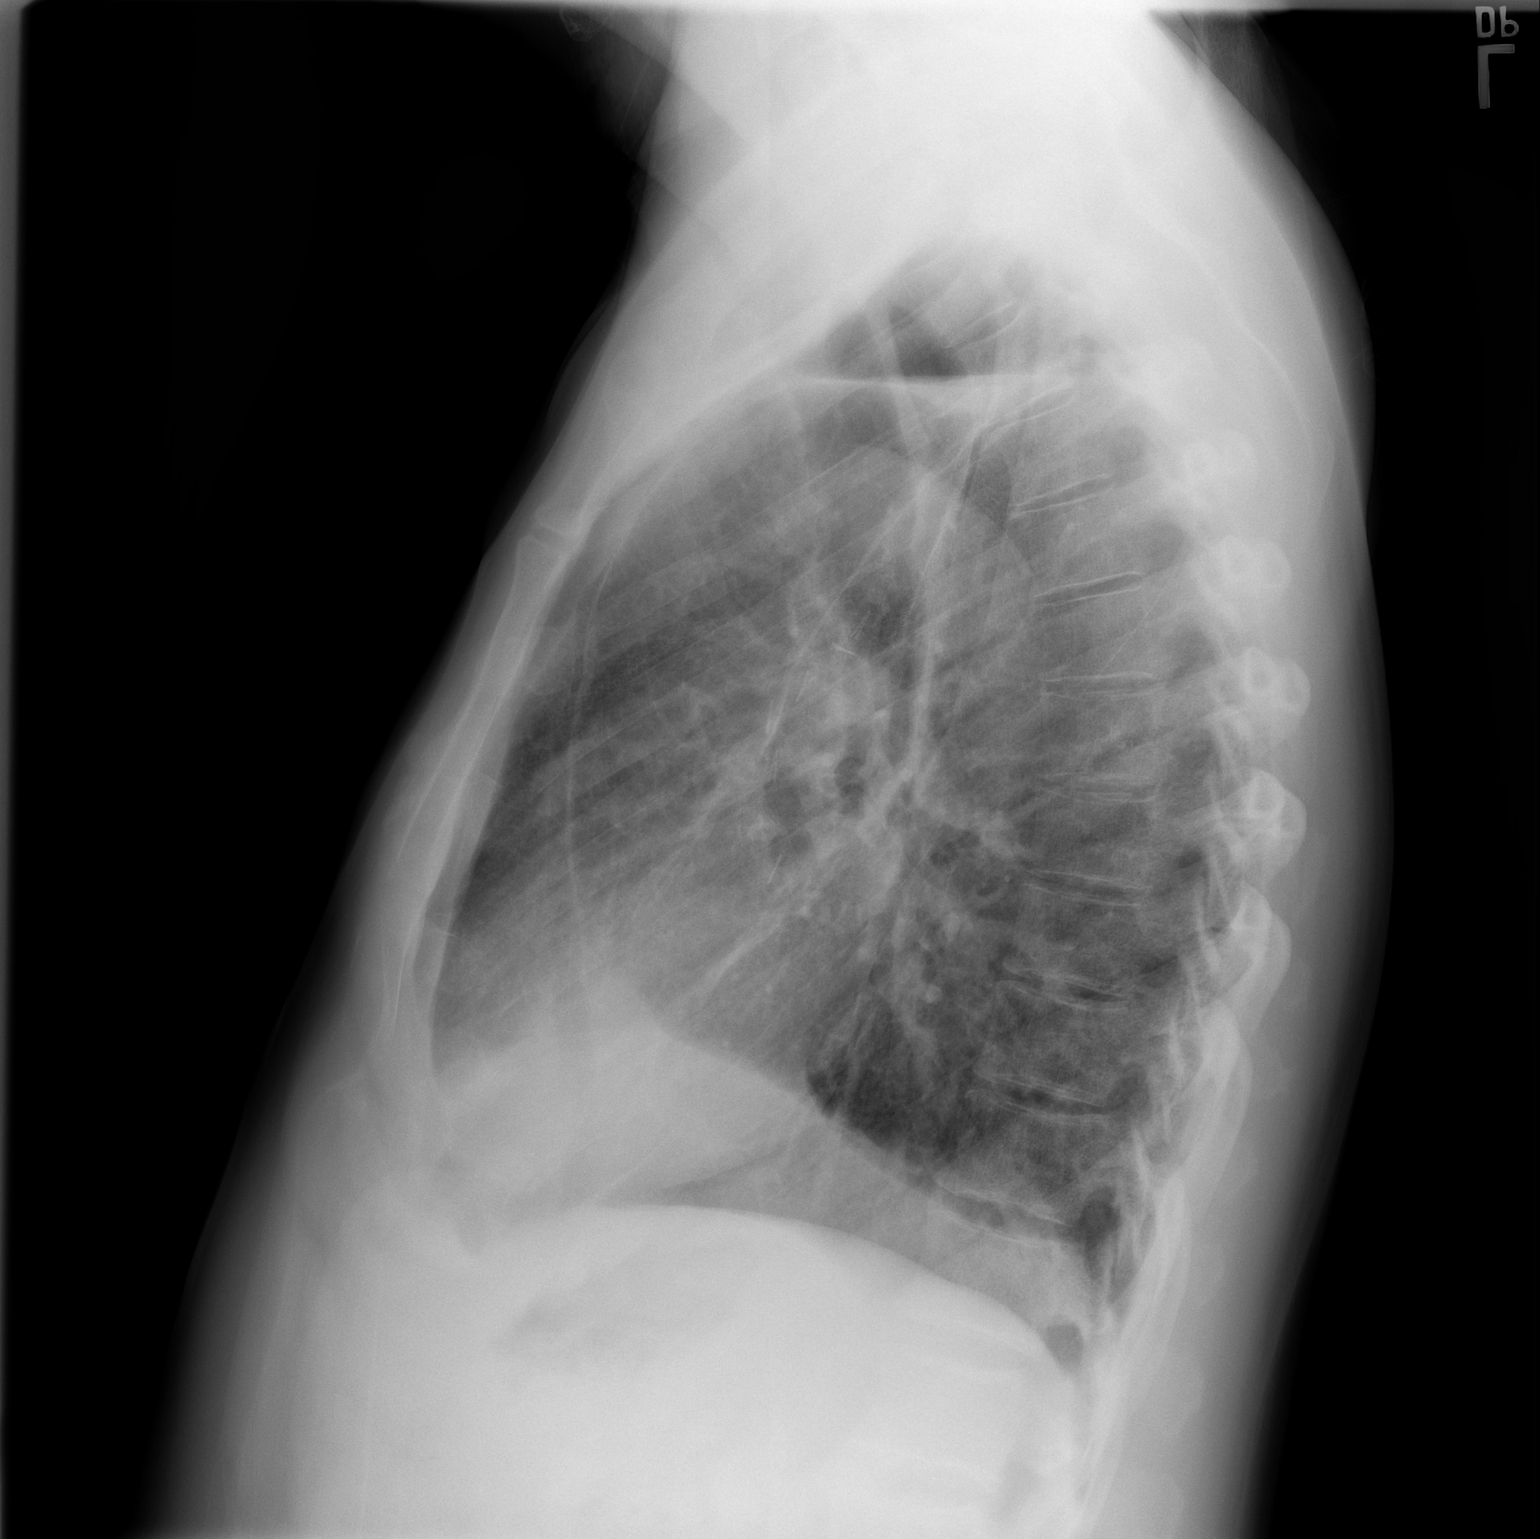

[2 of 2 positions shown; findings below may reference images not displayed]

FINDINGS: There has been interval removal of the right-sided chest tube. The
meniscus in the apex and the approximately 15% pneumothorax are
stable. The left lung is clear. The heart is normal in size. There
is minimal shift of the mediastinum toward the right which is
stable. The bony thorax exhibits no acute abnormality.
IMPRESSION: Stable appearance of the right hemi thorax since removal of the
chest tube. There remains a small right pleural effusion as well as
approximately 10-15% right apical pneumothorax.

## 2016-12-06 IMAGING — CR DG CHEST 2V
2 series · 2 of 2 positions shown · non-contrast
Comparison: Portable chest x-ray September 12, 2015

CLINICAL DATA: Lung malignancy status post surgery on September 04, 2015.

EXAM:
CHEST  2 VIEW

[view not recorded (1 of 2)]
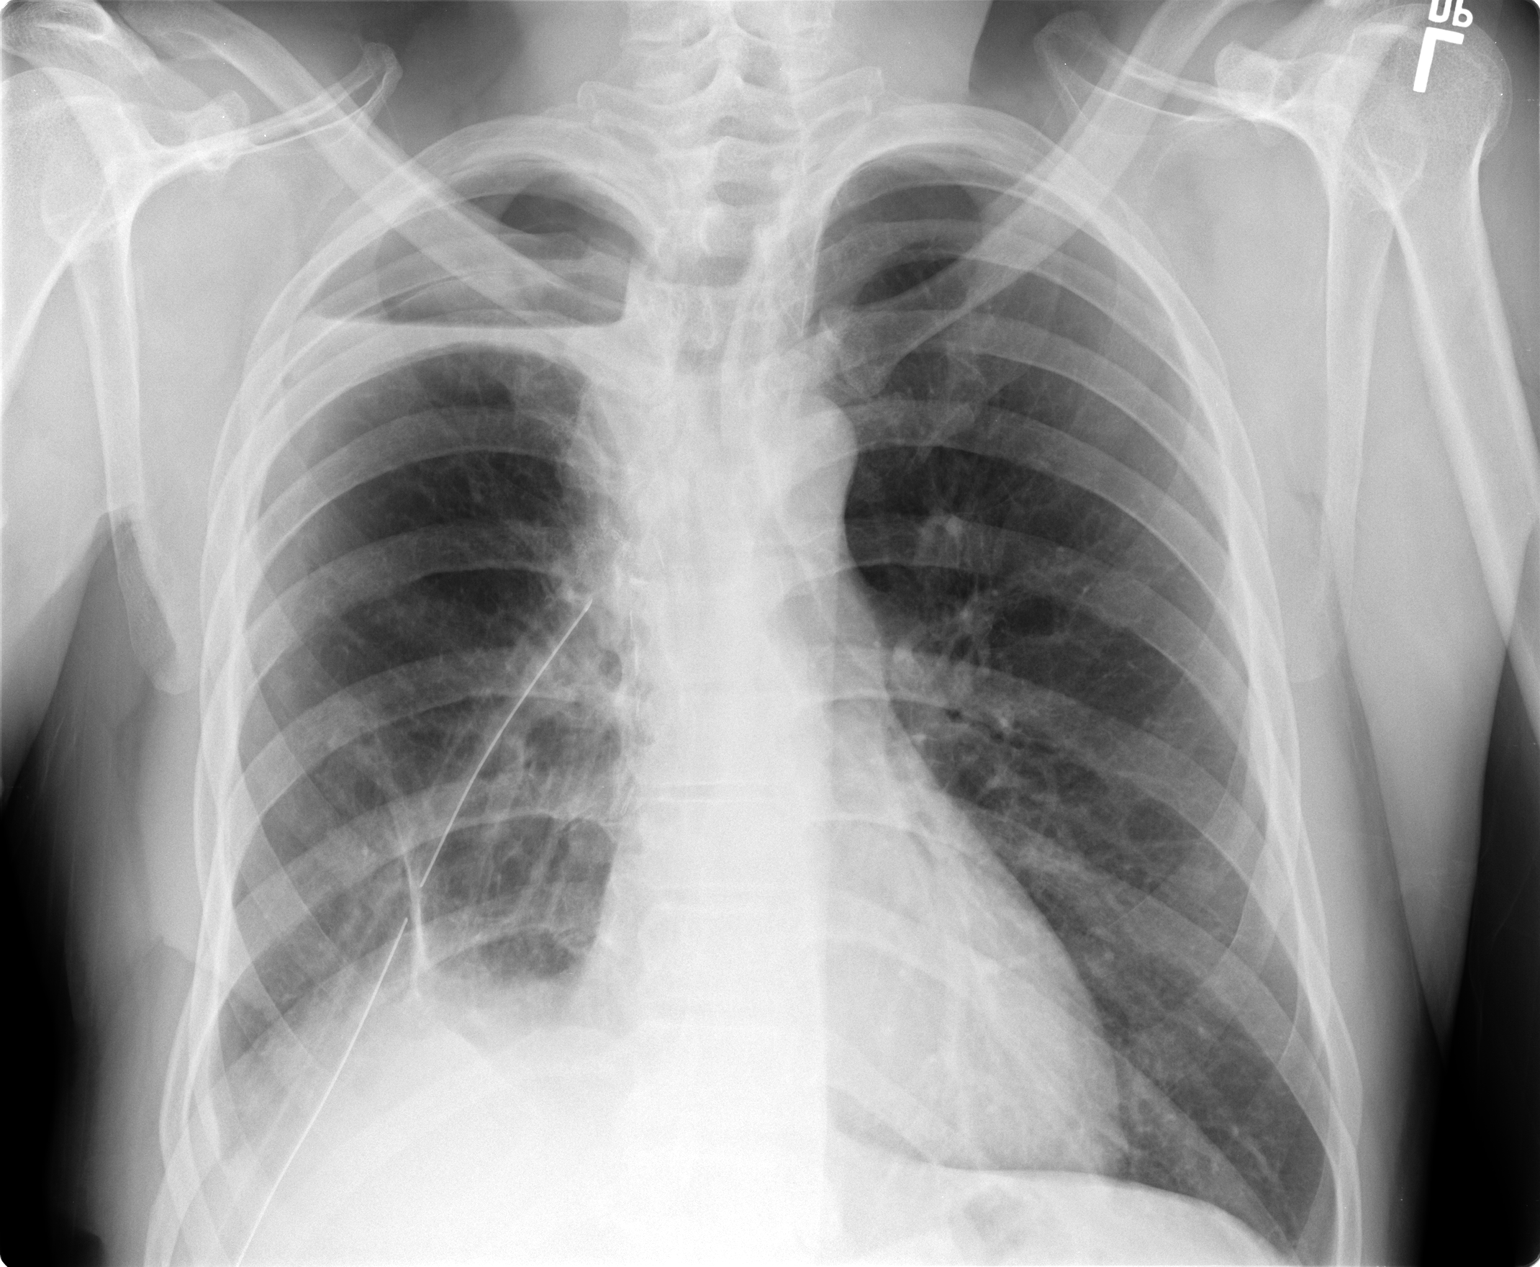

[view not recorded (2 of 2)]
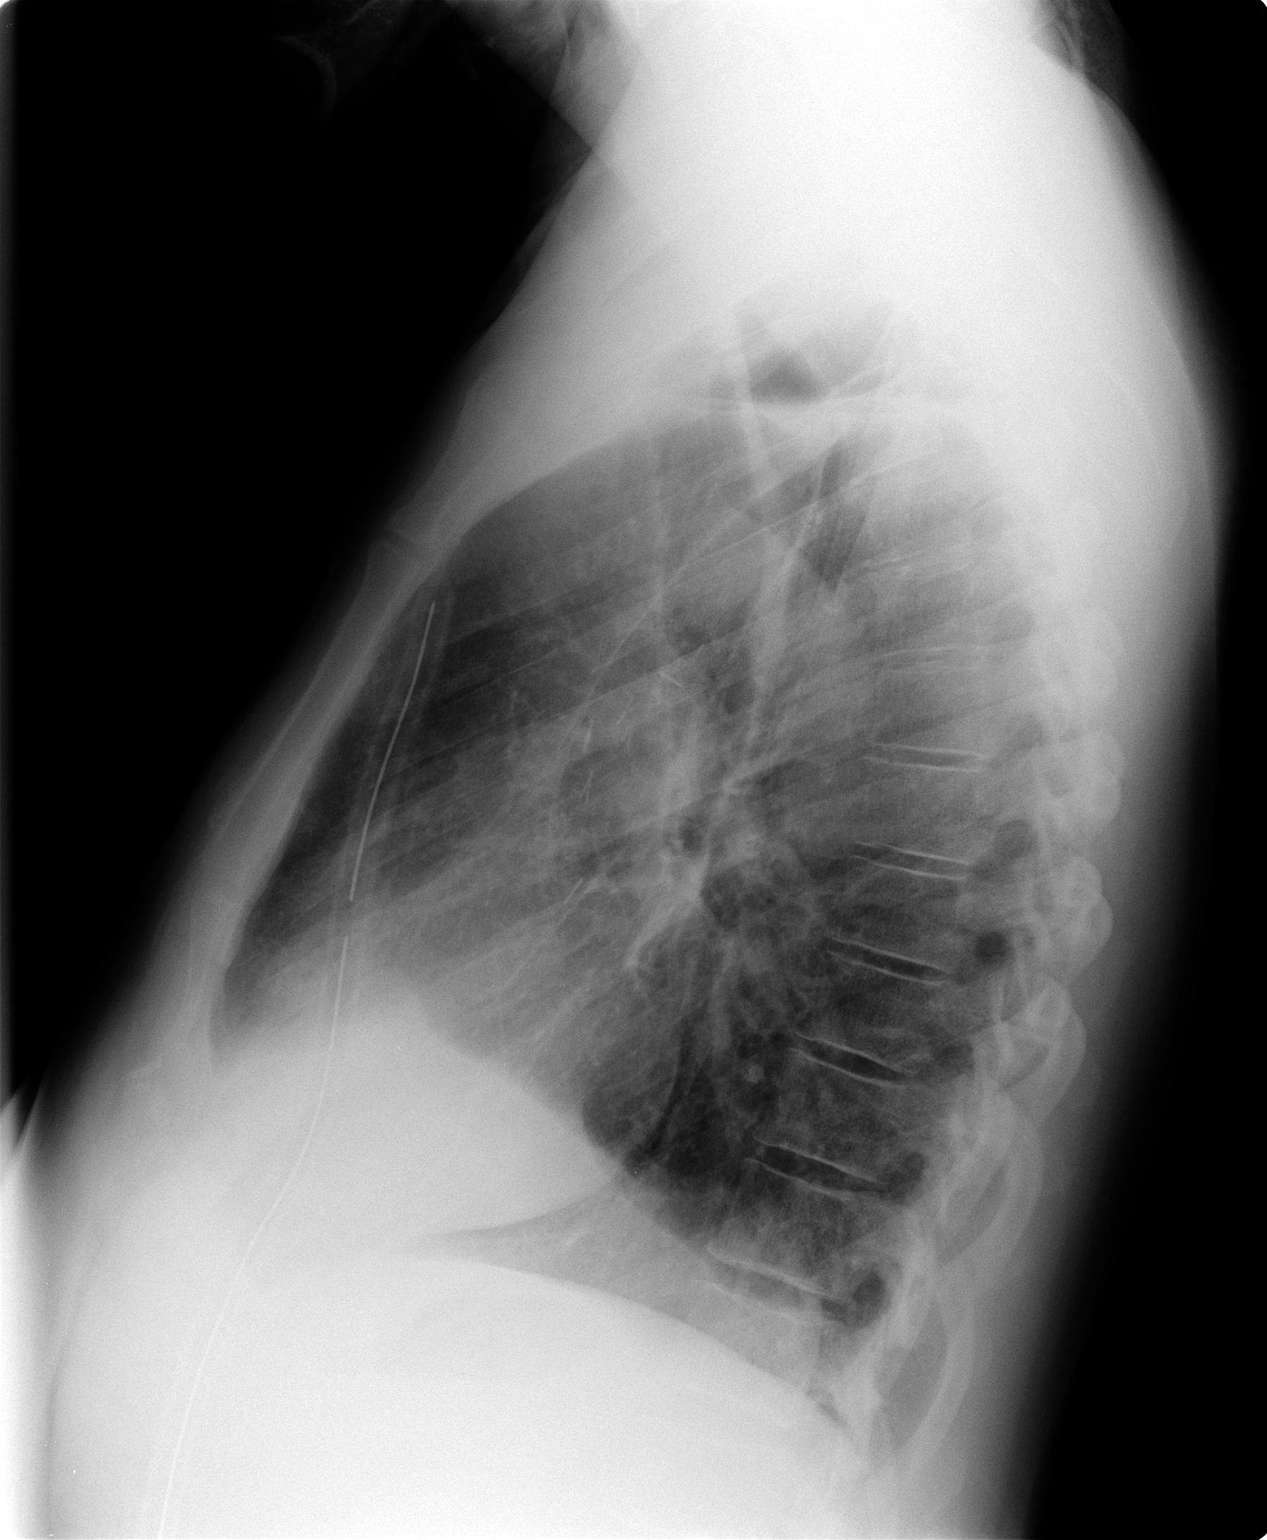

[2 of 2 positions shown; findings below may reference images not displayed]

FINDINGS: There remains a small right apical pneumothorax. The pleural line
lies between the fourth and fifth rib interspace. This is stable. A
small air-fluid level is noted in the pulmonary apex today. The
right-sided chest tube has its tip overlying the medial aspect of
the seventh rib. There is a small right pleural effusion. The left
lung is clear. There is minimal mediastinal shift toward the right.
The heart is normal in size. The pulmonary vascularity is not
engorged. The bony thorax is unremarkable.
IMPRESSION: Postsurgical changes on the right. A new apical meniscus is visible
on today's upright PA view. Within this a pleural line consistent
with a stable approximately 15% right sided pneumothorax is
demonstrated. The right-sided chest tube is in stable position.

## 2016-12-11 IMAGING — CR DG CHEST 2V
2 series · 2 of 2 positions shown · non-contrast
Comparison: 09/17/2015

CLINICAL DATA: History of lung cancer and surgery earlier this
month.

EXAM:
CHEST  2 VIEW

[w chest pa]
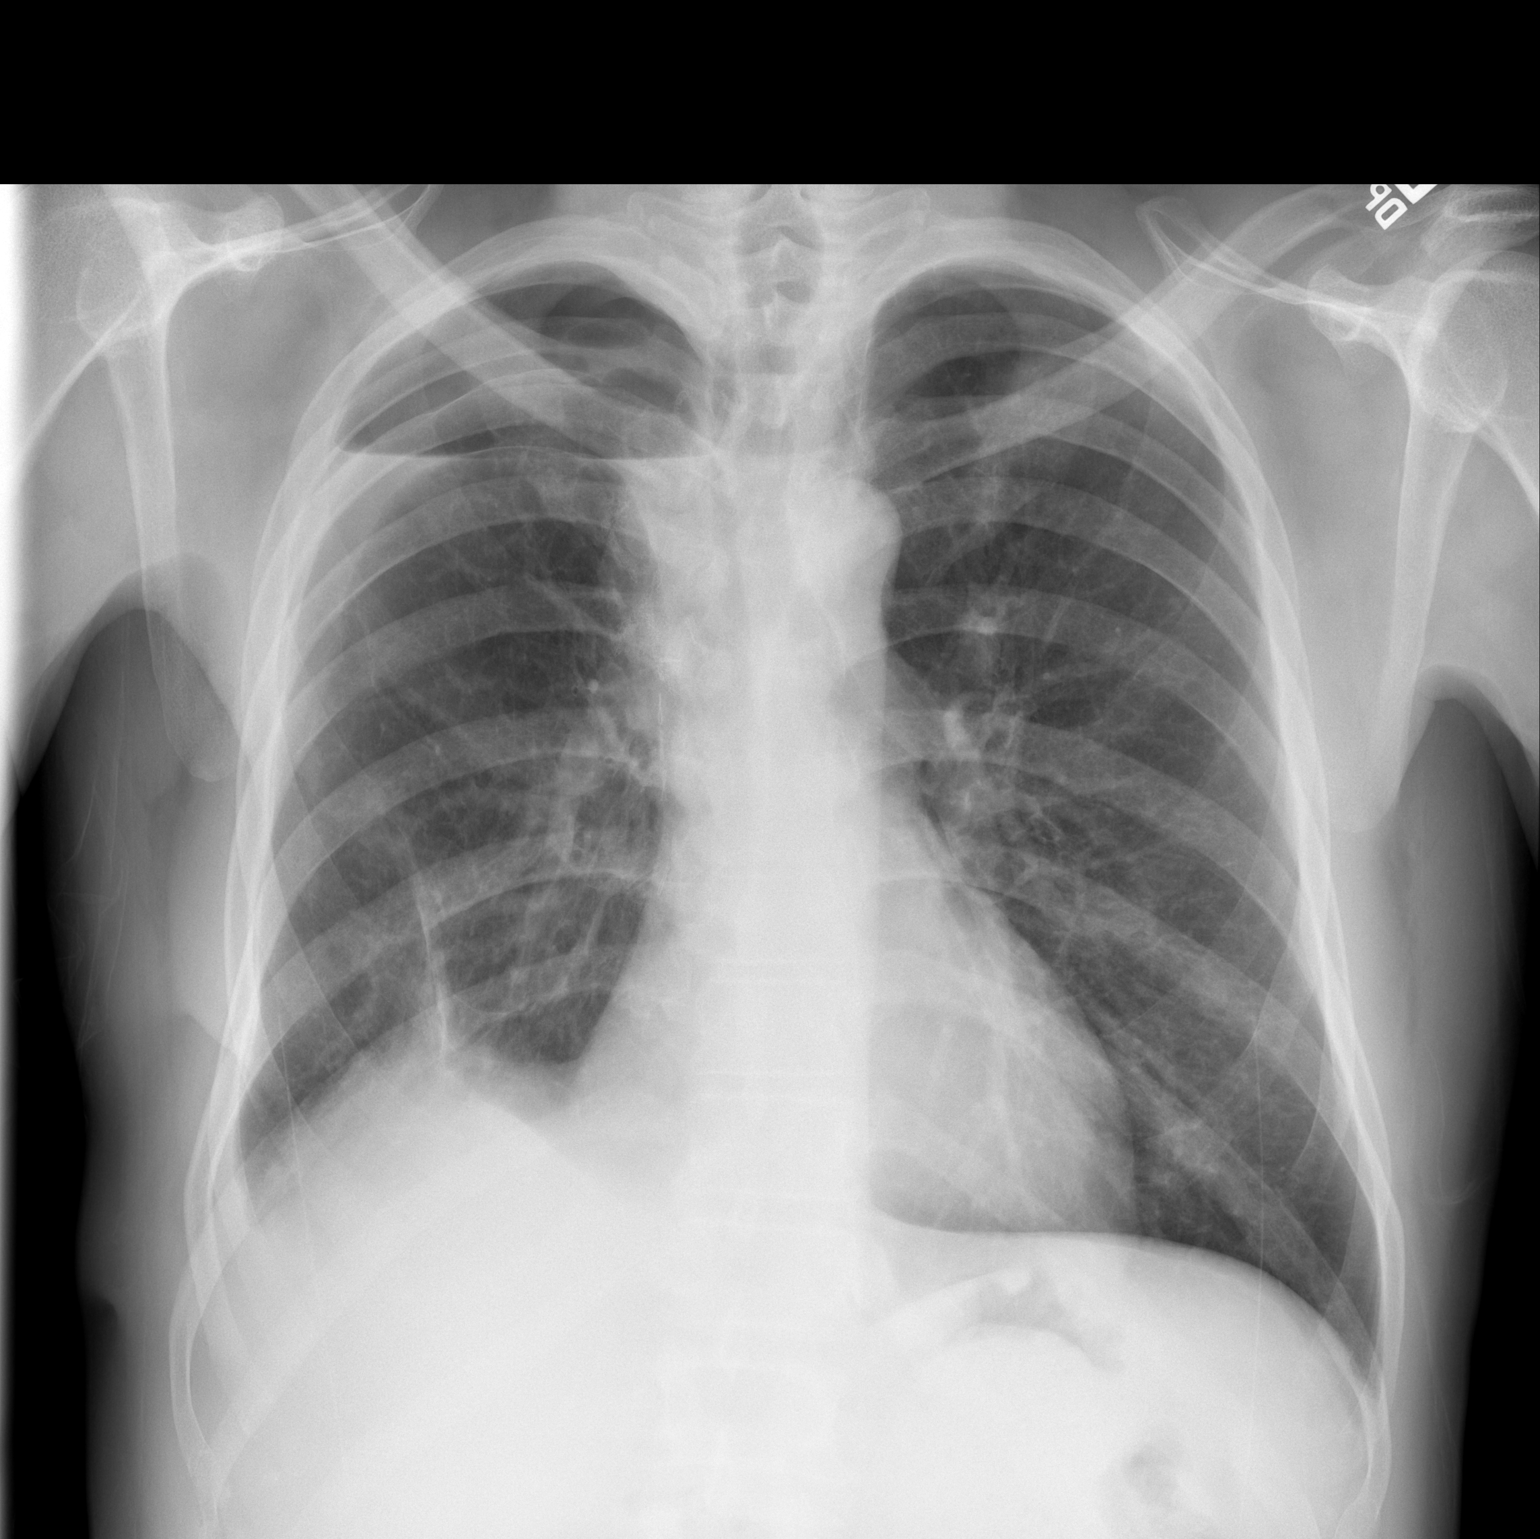

[w chest lat]
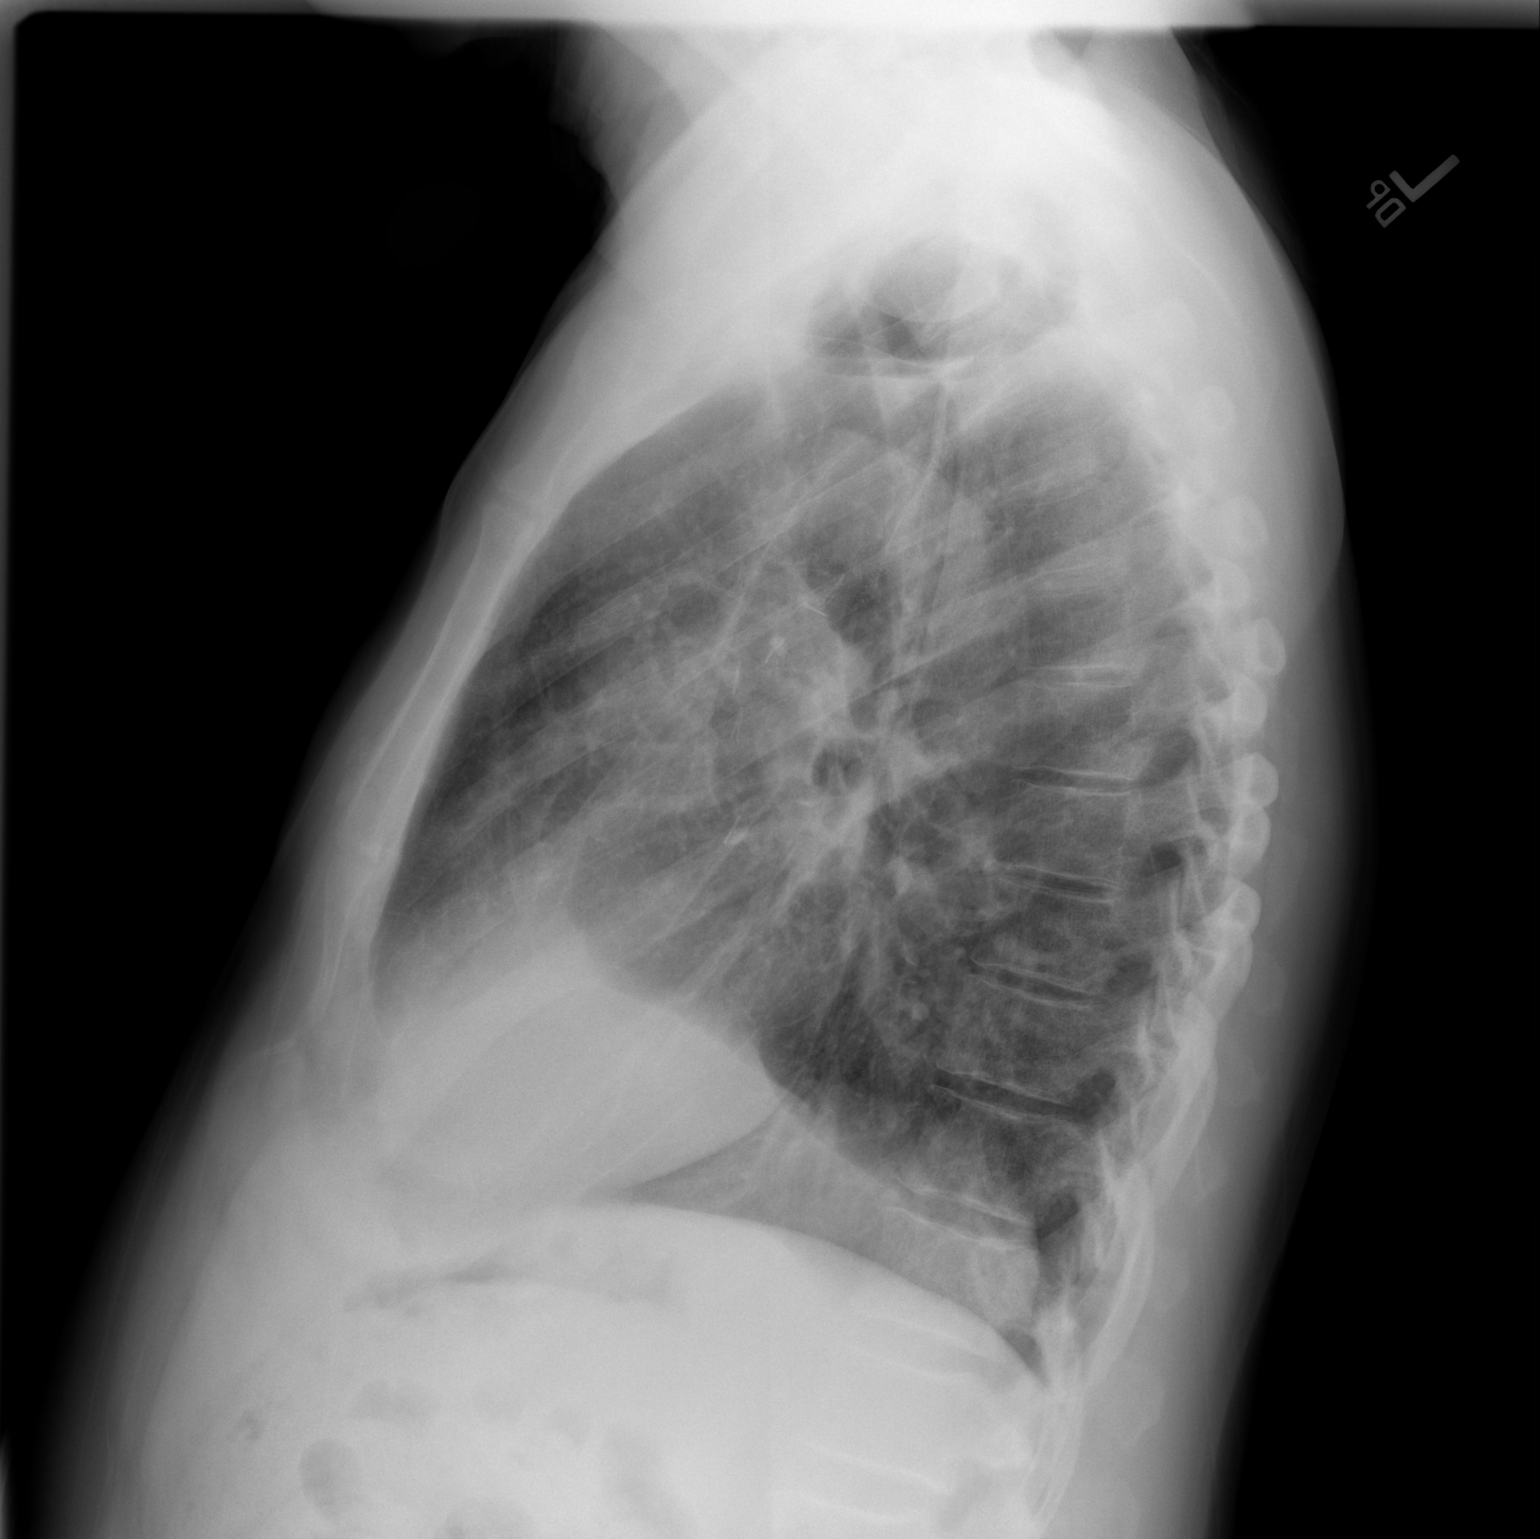

[2 of 2 positions shown; findings below may reference images not displayed]

FINDINGS: Stable loculated right apical hydro pneumothorax. Postoperative
changes on the right. Linear scarring in the right lung base left
lung is clear. Heart is normal size. No acute bony abnormality.
IMPRESSION: Stable loculated right apical hydro pneumothorax and small right
effusion.

## 2016-12-19 IMAGING — DX DG CHEST 2V
2 series · 2 of 2 positions shown · non-contrast
Comparison: 09/22/2015

CLINICAL DATA: Or RIGHT lung biopsy 2 weeks ago. RIGHT-sided chest
pain

EXAM:
CHEST  2 VIEW

[chest pa]
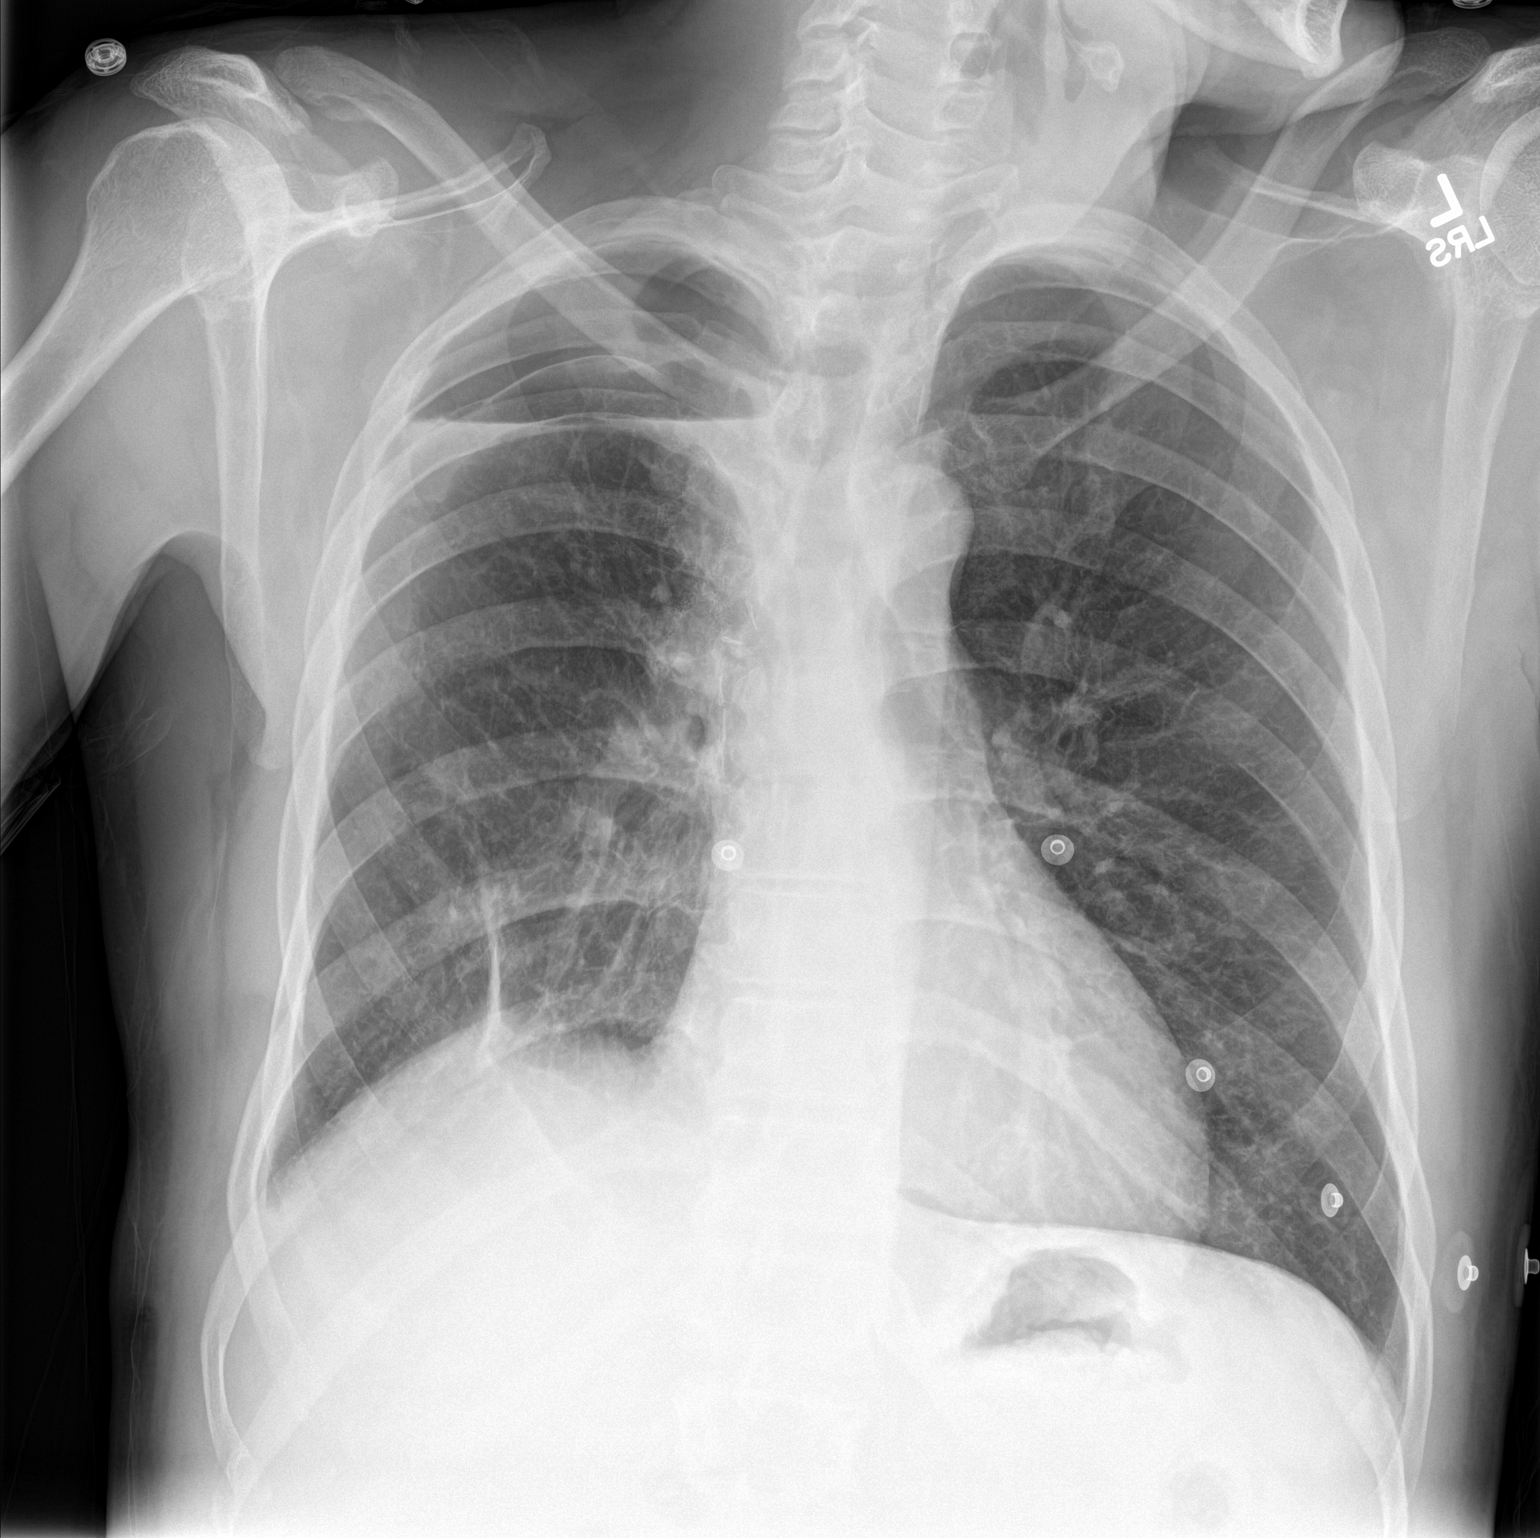

[chest lat]
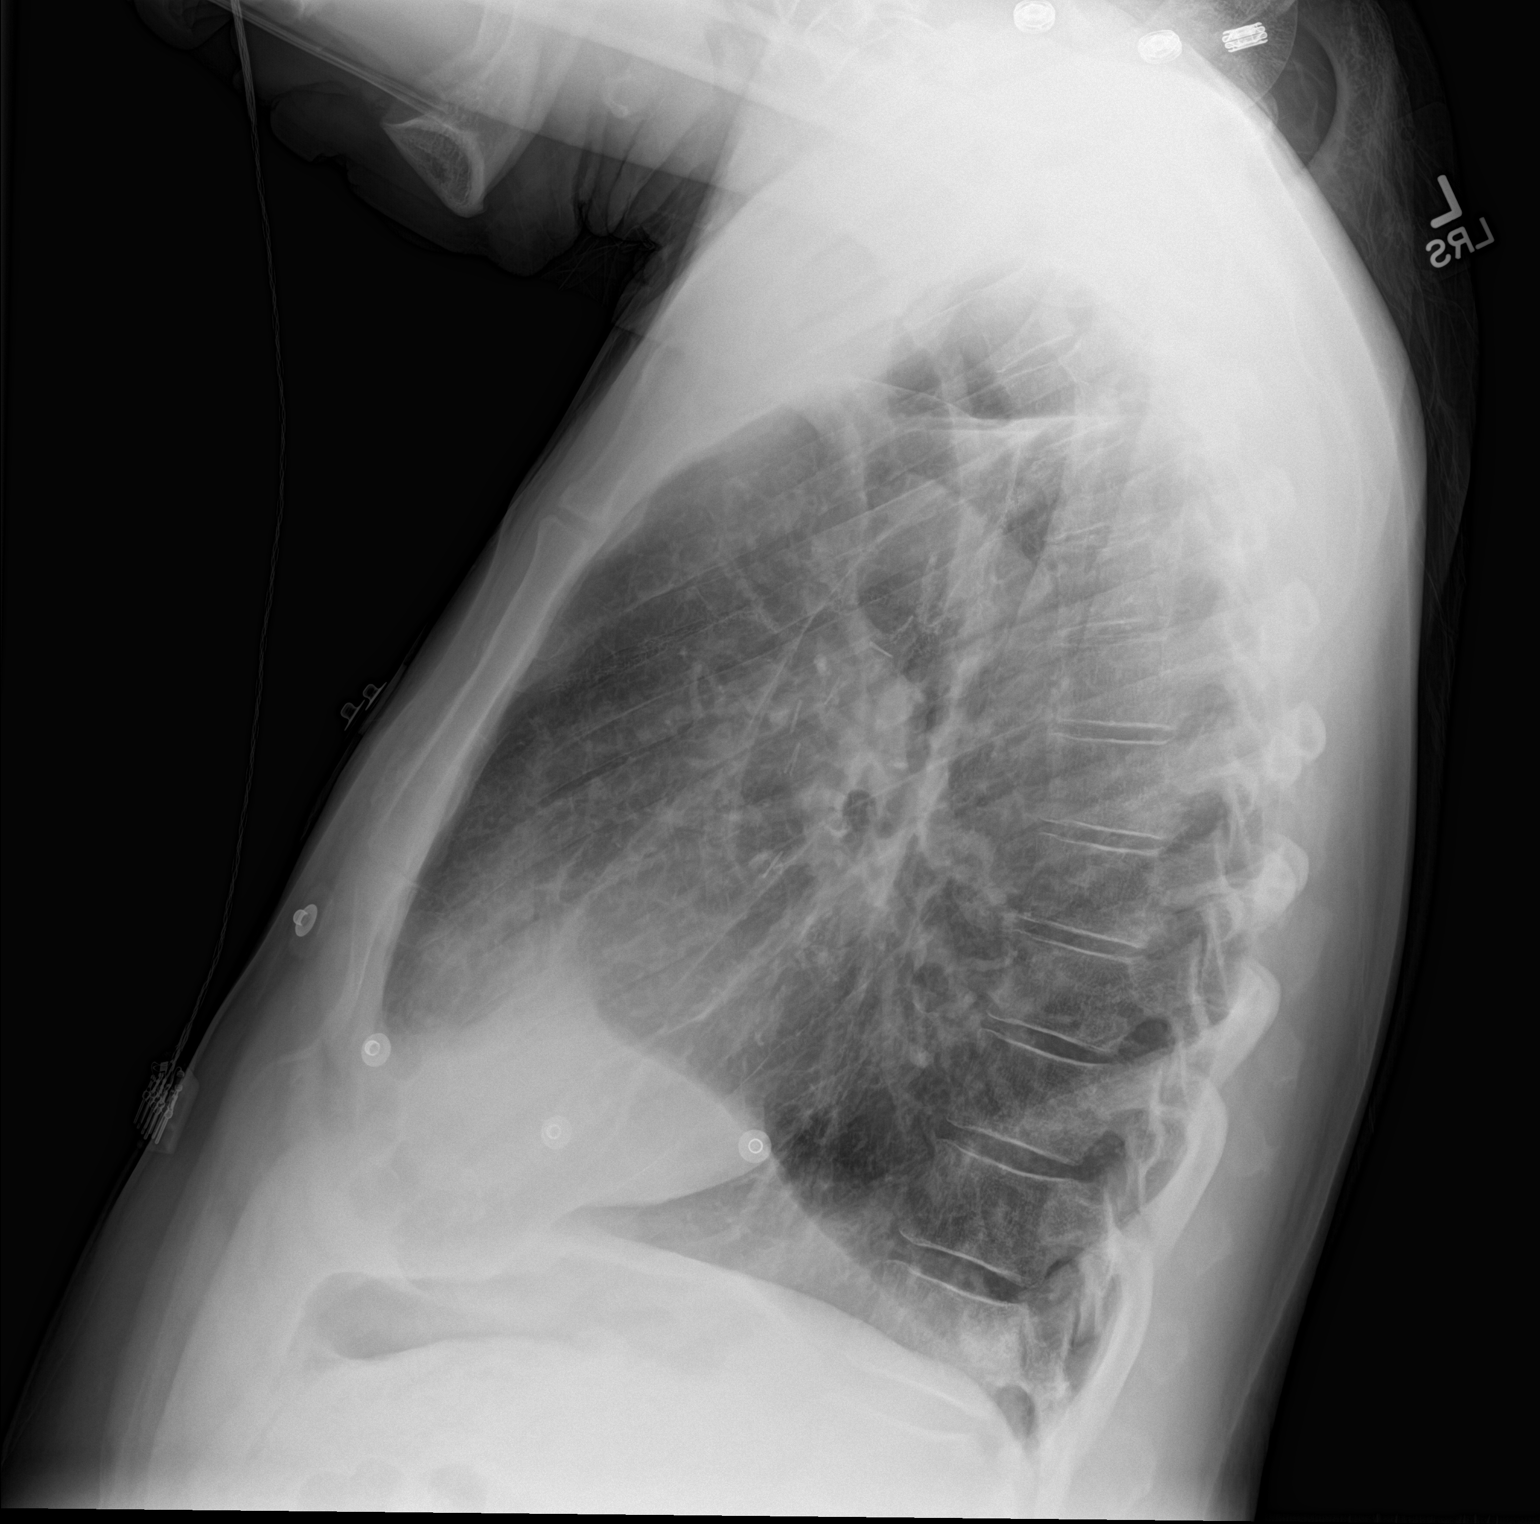

[2 of 2 positions shown; findings below may reference images not displayed]

FINDINGS: Normal cardiac silhouette. Persistent hydro pneumothorax on the
RIGHT. The air-fluid level is 49 mm from the apical chest wall
slightly reduced from 55 mm on prior. Pleural edge is approximately
30 mm from the chest wall compared to 28 mm. There is volume loss in
the RIGHT lung base. LEFT lung is clear.
IMPRESSION: Persistent hydropneumothorax on the RIGHT with no significant
change.

## 2016-12-20 IMAGING — CT CT ANGIO CHEST
2 of 6 series · 19 of 36 positions shown · IV contrast (Omni 300)
Comparison: Most recent radiographs 3 hours prior. Multiple prior
radiographs reviewed.

CLINICAL DATA: Chest pain and shortness of breath. Right lower
lobectomy 2 weeks prior.

EXAM:
CT ANGIOGRAPHY CHEST WITH CONTRAST
TECHNIQUE: Multidetector CT imaging of the chest was performed using the
standard protocol during bolus administration of intravenous
contrast. Multiplanar CT image reconstructions and MIPs were
obtained to evaluate the vascular anatomy.
CONTRAST:  70mL OMNIPAQUE IOHEXOL 350 MG/ML SOLN

[Series 6: pe thins · axial · 0.71mm/px · z∈[-321,-15]mm · 18 of 679 slices shown]
[im 33/679  lung]
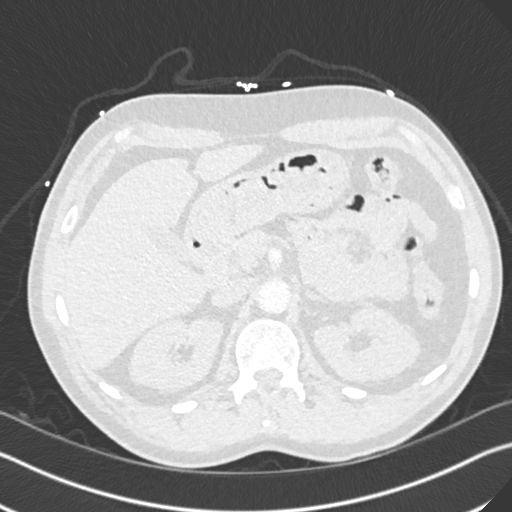
[im 65/679  mediastinal]
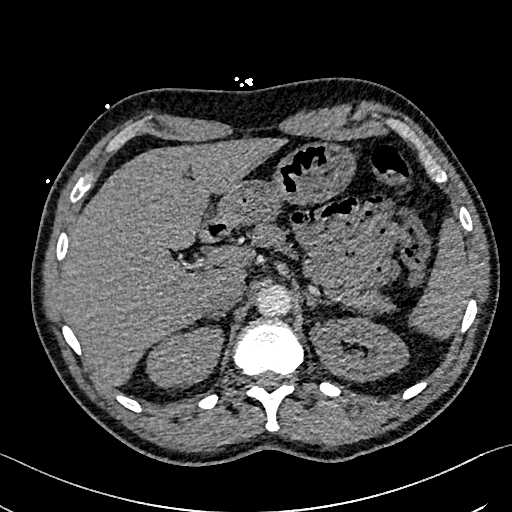
[im 97/679  lung]
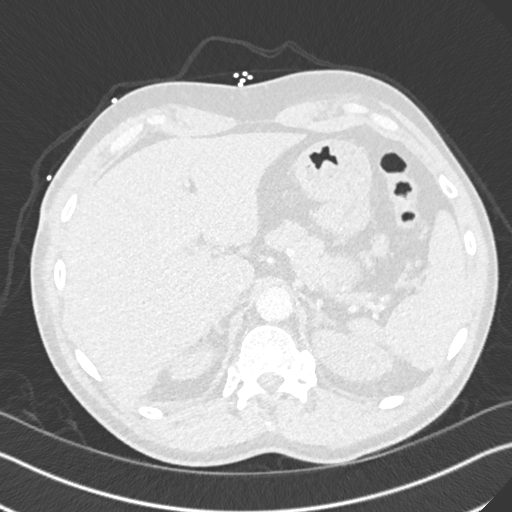
[im 130/679  mediastinal]
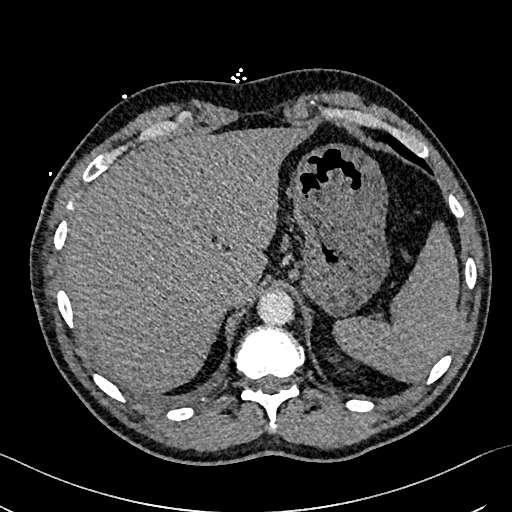
[im 194/679  lung]
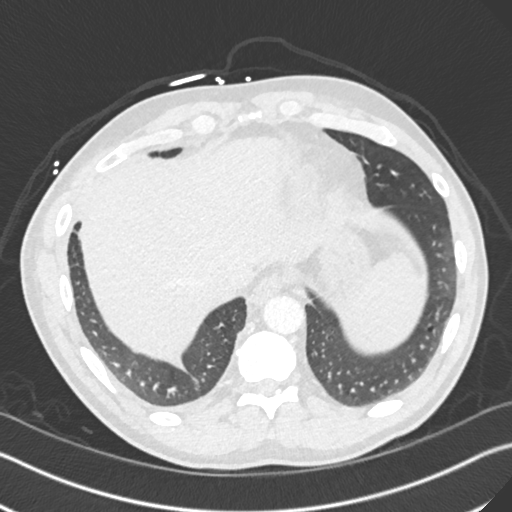
[im 227/679  mediastinal]
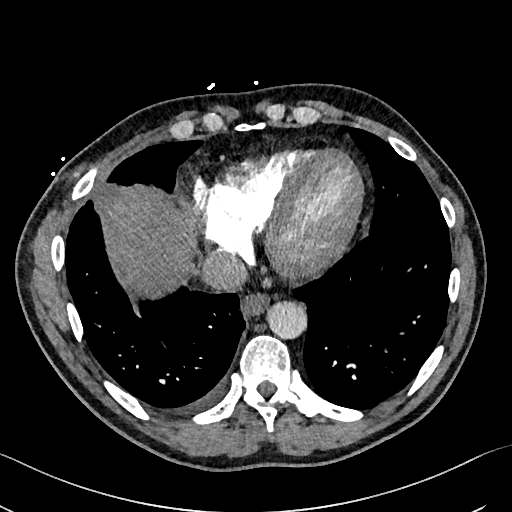
[im 259/679  lung]
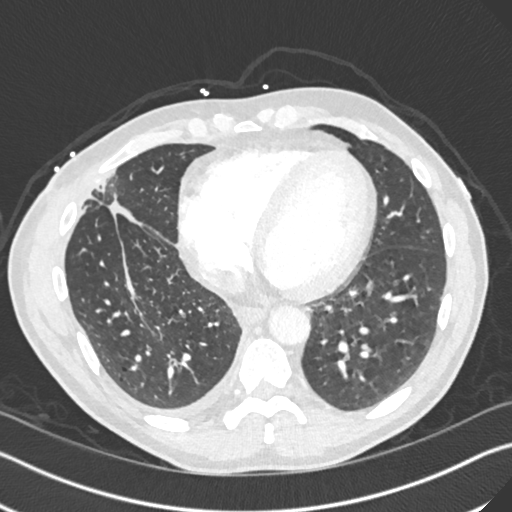
[im 291/679  mediastinal]
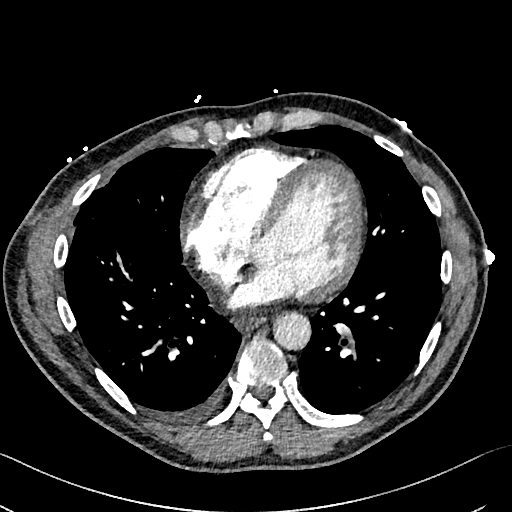
[im 323/679  lung]
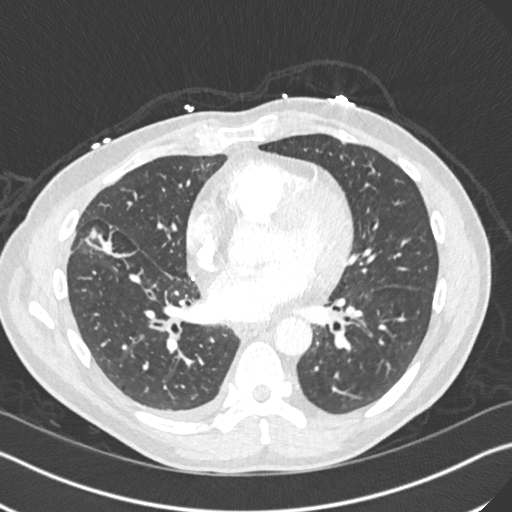
[im 356/679  mediastinal]
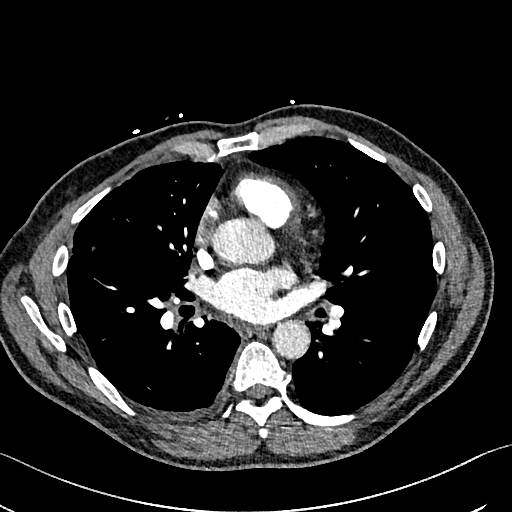
[im 388/679  lung]
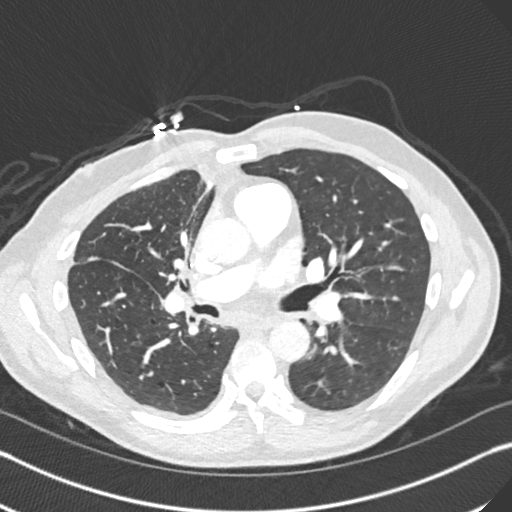
[im 420/679  mediastinal]
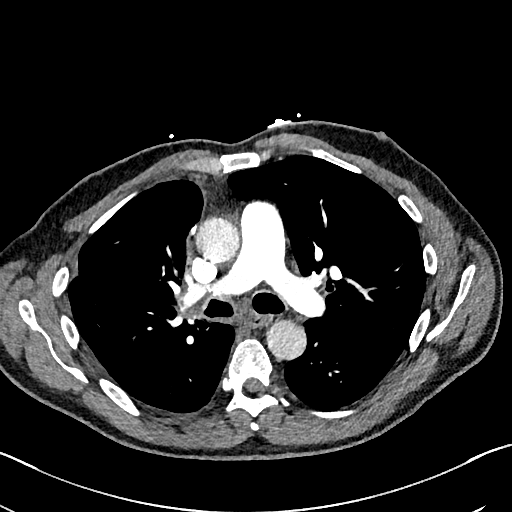
[im 453/679  lung]
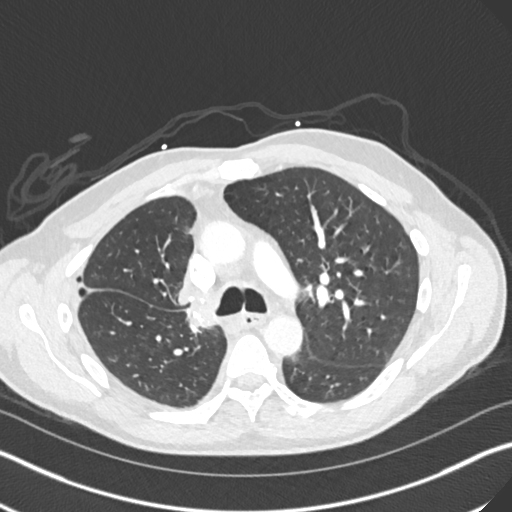
[im 517/679  mediastinal]
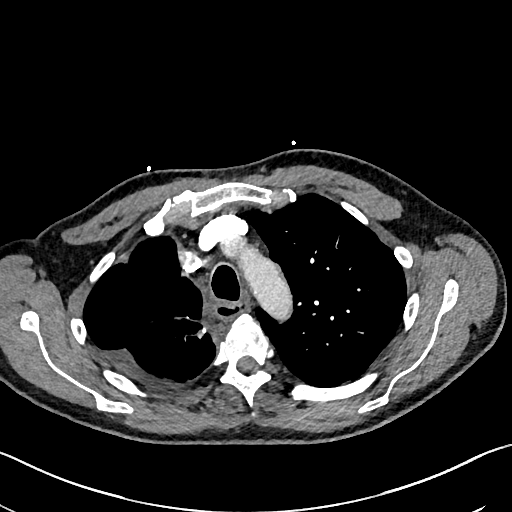
[im 549/679  lung]
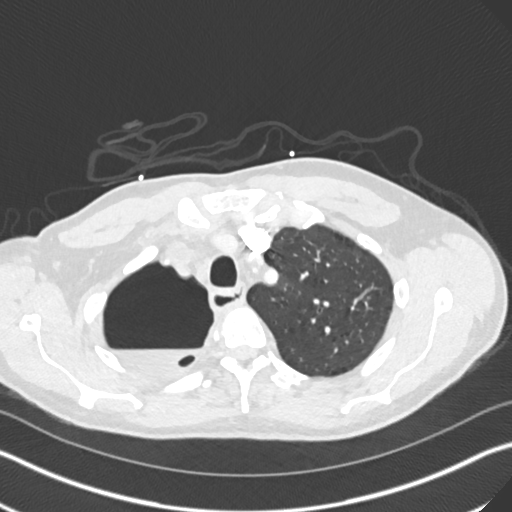
[im 582/679  mediastinal]
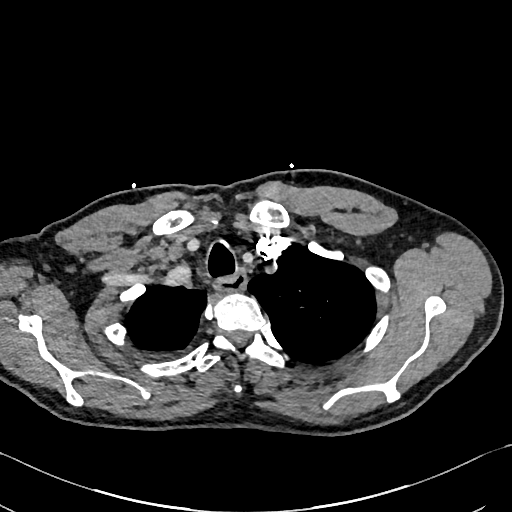
[im 614/679  lung]
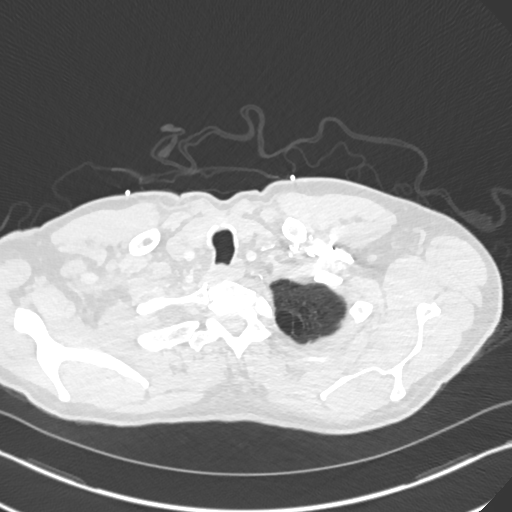
[im 646/679  mediastinal]
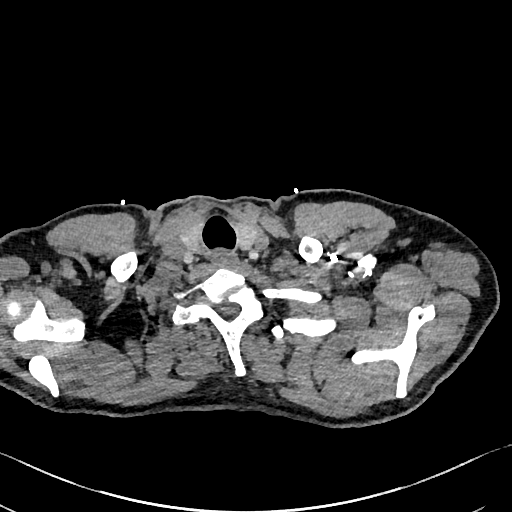

[Series 7: pe 2mm cor · coronal · 0.66mm/px · 1 of 130 slices shown]
[im 65/130  mediastinal]
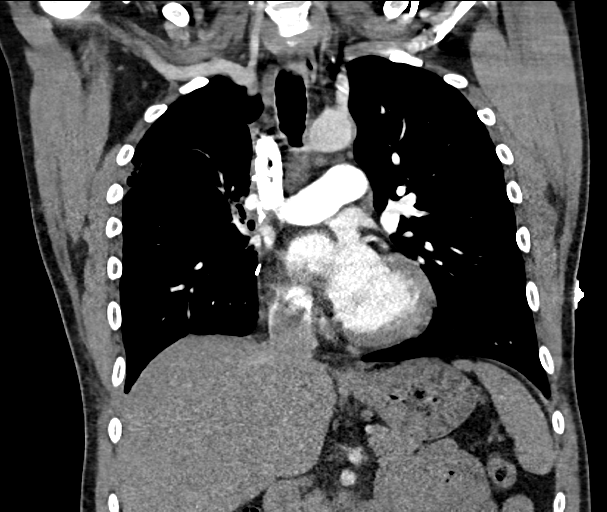

[19 of 36 positions shown; findings below may reference images not displayed]

FINDINGS: There are no filling defects within the pulmonary arteries to
suggest pulmonary embolus.

Post right upper lobectomy. Loculated small to moderate right apical
hydro pneumothorax is again seen. This is stable in size from prior
radiographs. Linear opacities in the anterior right lower lobe,
likely related scarring are unchanged. There is no mediastinal
shift. Mild emphysematous change in the lungs, left lung is
otherwise clear. Bronchial thickening noted to the lower lobes. No
consolidation. No left pleural effusion.

Thoracic aorta is normal in caliber. There are scattered coronary
artery calcifications. Heart is at the upper limits of normal in
size. Small mediastinal lymph nodes are unchanged from prior PET-CT.
There is no hilar adenopathy. No pericardial effusion.

No acute abnormality in the included upper abdomen.

There are no acute or suspicious osseous abnormalities.

Review of the MIP images confirms the above findings.
IMPRESSION: 1. No pulmonary embolus.
2. Post recent right upper lobectomy with stable right apical
hydropneumothorax from prior radiographs.
3. Lower lobe bronchial thickening.  Mild emphysema.

## 2016-12-21 ENCOUNTER — Institutional Professional Consult (permissible substitution): Payer: Medicare Other | Admitting: Neurology

## 2016-12-27 ENCOUNTER — Other Ambulatory Visit: Payer: Self-pay | Admitting: Internal Medicine

## 2016-12-27 DIAGNOSIS — F418 Other specified anxiety disorders: Secondary | ICD-10-CM

## 2017-01-02 ENCOUNTER — Encounter: Payer: Self-pay | Admitting: Internal Medicine

## 2017-01-03 ENCOUNTER — Other Ambulatory Visit: Payer: Self-pay | Admitting: Internal Medicine

## 2017-01-03 DIAGNOSIS — M170 Bilateral primary osteoarthritis of knee: Secondary | ICD-10-CM

## 2017-01-11 ENCOUNTER — Ambulatory Visit (INDEPENDENT_AMBULATORY_CARE_PROVIDER_SITE_OTHER): Payer: Medicare Other | Admitting: Neurology

## 2017-01-11 ENCOUNTER — Encounter: Payer: Self-pay | Admitting: Neurology

## 2017-01-11 VITALS — BP 146/90 | HR 88 | Ht 75.0 in | Wt 203.0 lb

## 2017-01-11 DIAGNOSIS — R51 Headache: Secondary | ICD-10-CM

## 2017-01-11 DIAGNOSIS — R0683 Snoring: Secondary | ICD-10-CM

## 2017-01-11 DIAGNOSIS — R0689 Other abnormalities of breathing: Secondary | ICD-10-CM | POA: Diagnosis not present

## 2017-01-11 DIAGNOSIS — G478 Other sleep disorders: Secondary | ICD-10-CM

## 2017-01-11 DIAGNOSIS — F39 Unspecified mood [affective] disorder: Secondary | ICD-10-CM

## 2017-01-11 DIAGNOSIS — R519 Headache, unspecified: Secondary | ICD-10-CM

## 2017-01-11 DIAGNOSIS — F17200 Nicotine dependence, unspecified, uncomplicated: Secondary | ICD-10-CM

## 2017-01-11 NOTE — Patient Instructions (Signed)
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
## 2017-01-14 IMAGING — MR MR HEAD WO/W CM
11 of 14 series · 34 of 48 positions shown · IV contrast (multihance)
Comparison: No comparison brain MR or head CT.

CLINICAL DATA: 54-year-old male with lung cancer. Staging. Initial
encounter.

EXAM:
MRI HEAD WITHOUT AND WITH CONTRAST
TECHNIQUE: Multiplanar, multiecho pulse sequences of the brain and surrounding
structures were obtained without and with intravenous contrast.
CONTRAST:  20mL MULTIHANCE GADOBENATE DIMEGLUMINE 529 MG/ML IV SOLN

[Series 3: T1 · sagittal · 5.0mm · 0.47mm/px · 1 of 24 slices shown]
[im 1/24]
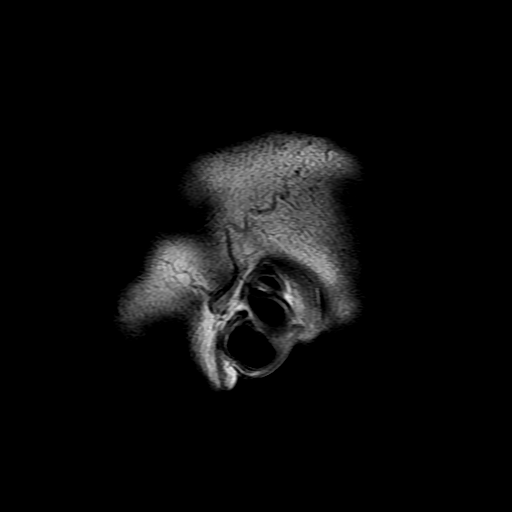

[Series 4: DWI · axial · 3.0mm · 1.09mm/px · z∈[-38,+121]mm · 8 of 108 slices shown (1 of 4)]
[im 1/108]
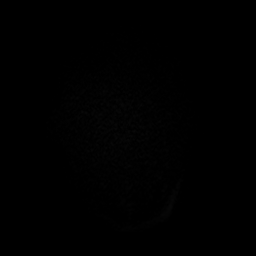
[im 16/108]
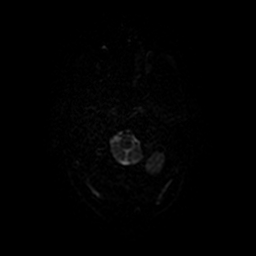
[im 31/108]
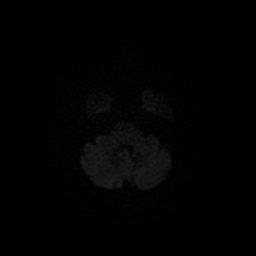
[im 46/108]
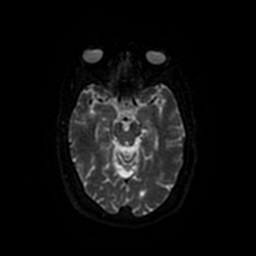
[im 62/108]
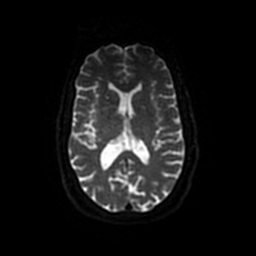
[im 77/108]
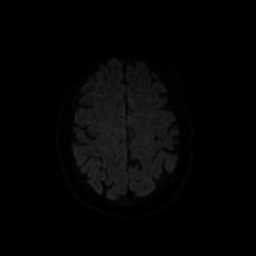
[im 92/108]
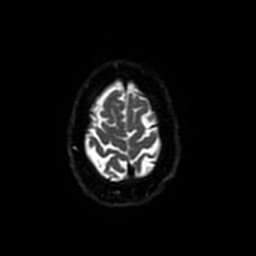
[im 108/108]
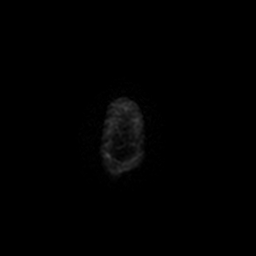

[Series 5: DWI · coronal · 5.0mm · 1.09mm/px · 6 of 76 slices shown (2 of 4)]
[im 1/76]
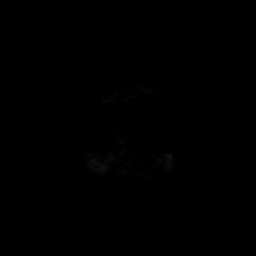
[im 16/76]
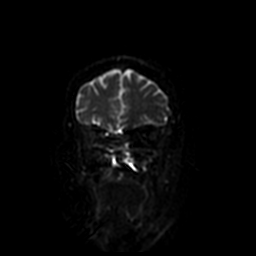
[im 31/76]
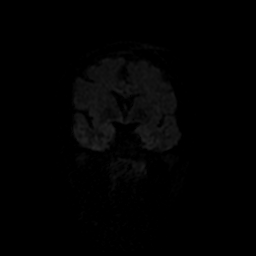
[im 46/76]
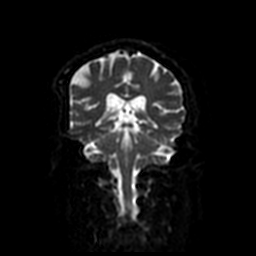
[im 61/76]
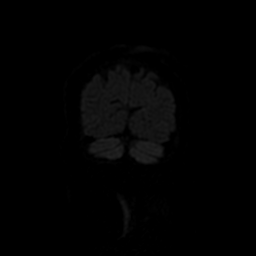
[im 76/76]
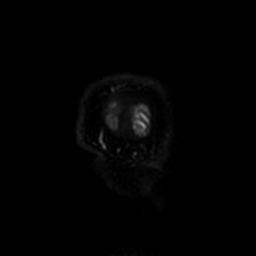

[Series 6: T2 · axial · 5.0mm · 0.47mm/px · z∈[-46,+123]mm · 2 of 27 slices shown (1 of 2)]
[im 1/27]
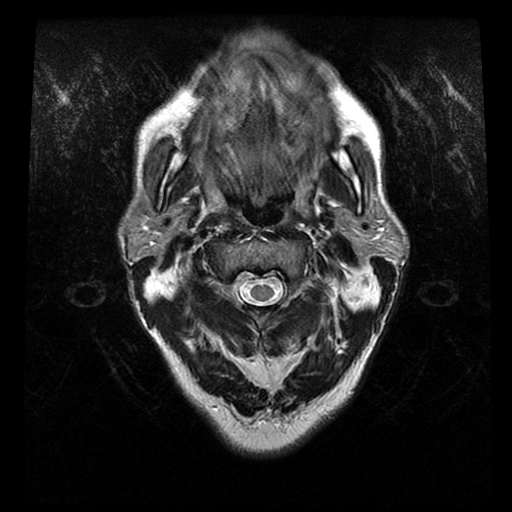
[im 27/27]
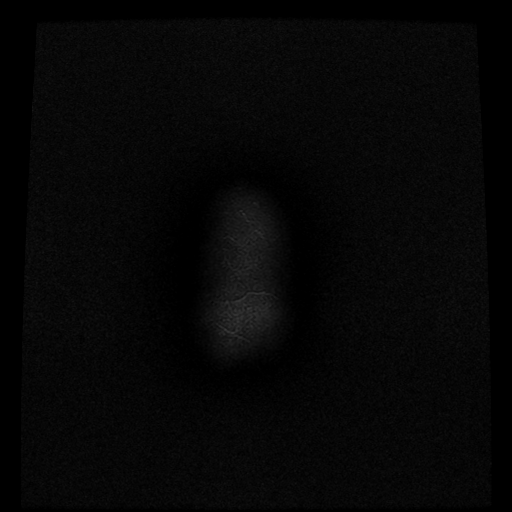

[Series 7: T2 · axial · 5.0mm · 0.43mm/px · z∈[-48,+126]mm · 2 of 30 slices shown (2 of 2)]
[im 1/30]
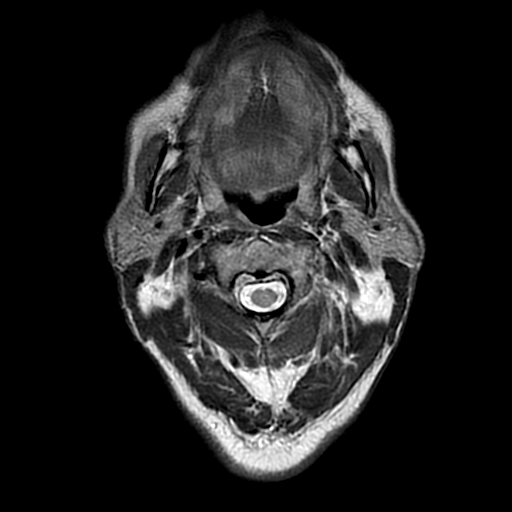
[im 30/30]
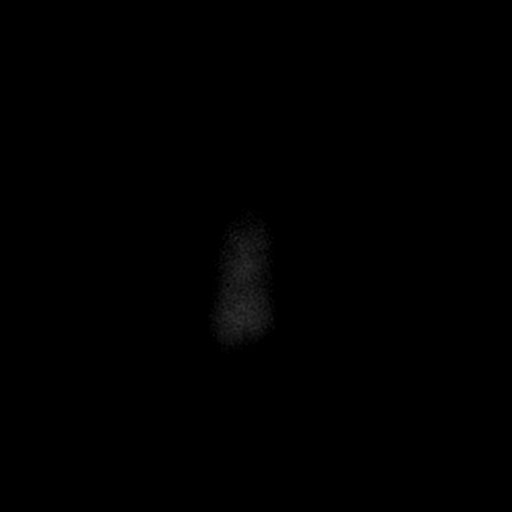

[Series 8: FLAIR · axial · 5.0mm · 0.43mm/px · z∈[-48,+126]mm · 2 of 30 slices shown]
[im 1/30]
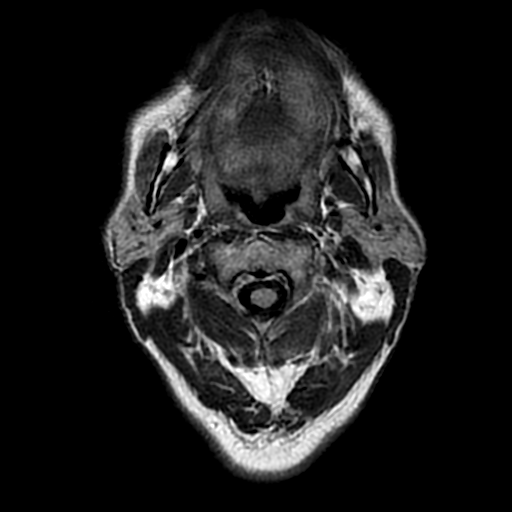
[im 30/30]
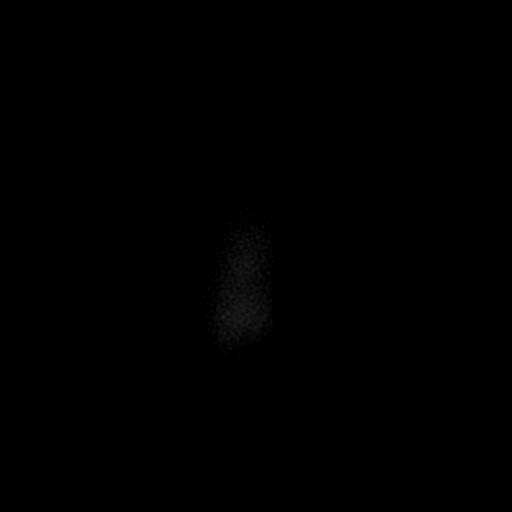

[Series 11: T2 post-contrast · coronal · 5.0mm · 0.45mm/px · 2 of 29 slices shown]
[im 1/29]
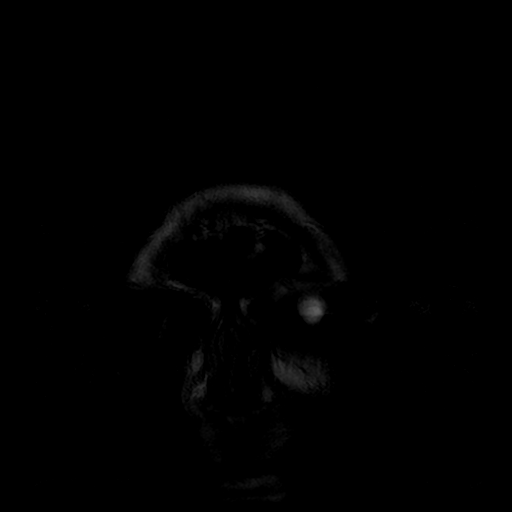
[im 29/29]
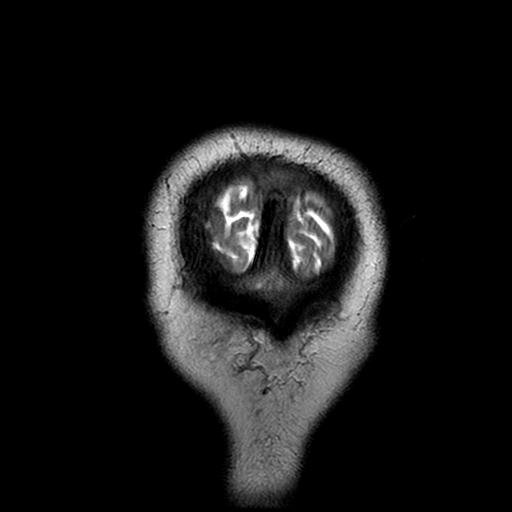

[Series 13: T1 post-contrast · coronal · 5.0mm · 0.45mm/px · 2 of 29 slices shown (1 of 2)]
[im 1/29]
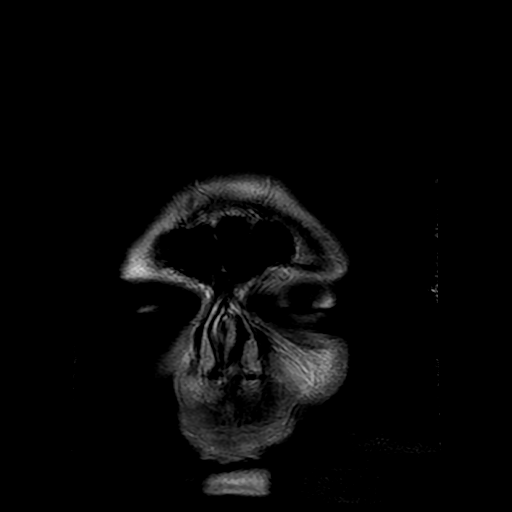
[im 29/29]
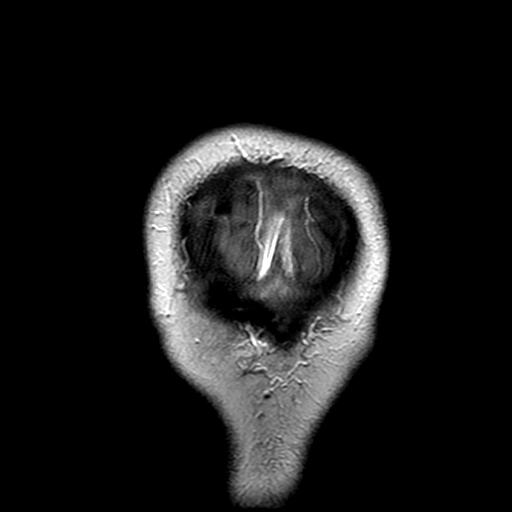

[Series 14: T1 post-contrast · sagittal · 5.0mm · 0.47mm/px · 2 of 24 slices shown (2 of 2)]
[im 1/24]
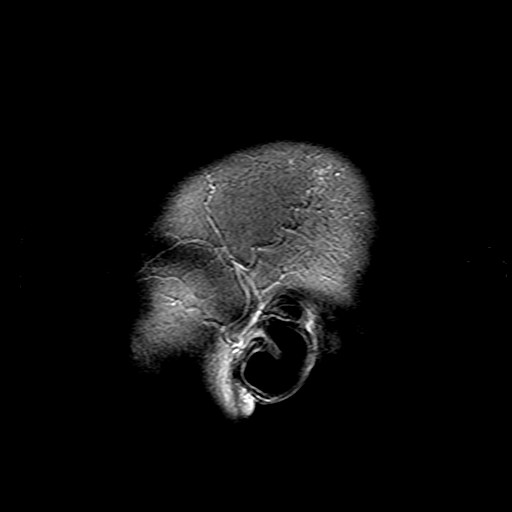
[im 24/24]
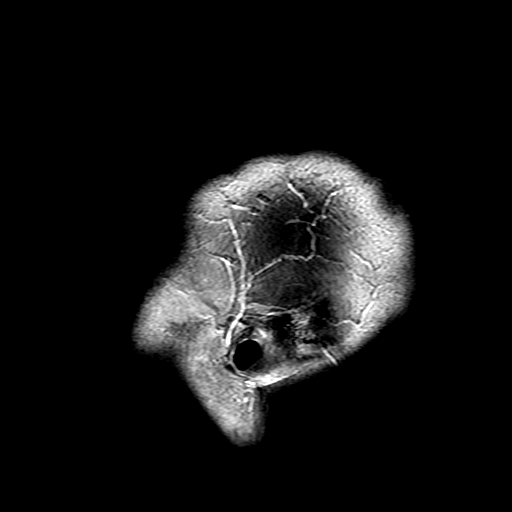

[Series 400: DWI · axial · 3.0mm · 1.09mm/px · z∈[-38,+121]mm · 4 of 54 slices shown (3 of 4)]
[im 1/54]
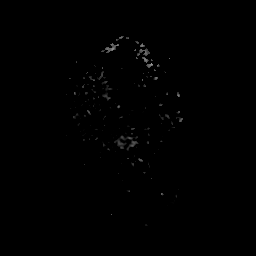
[im 18/54]
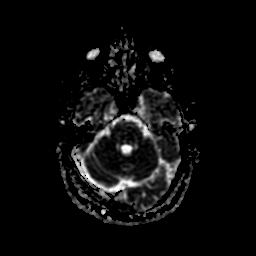
[im 36/54]
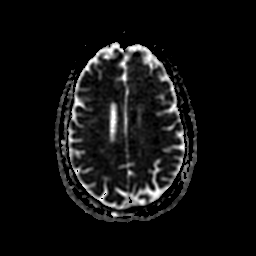
[im 54/54]
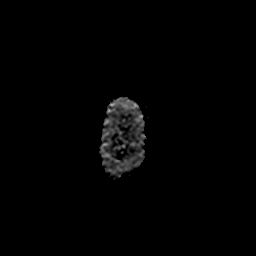

[Series 500: DWI · coronal · 5.0mm · 1.09mm/px · 3 of 38 slices shown (4 of 4)]
[im 1/38]
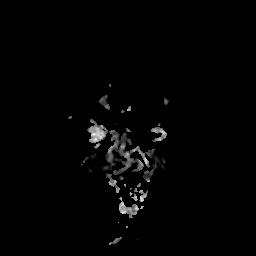
[im 19/38]
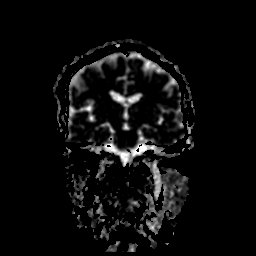
[im 38/38]
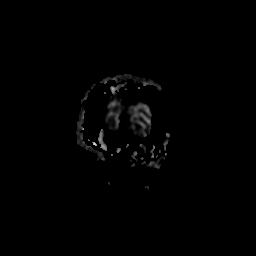

[34 of 48 positions shown; findings below may reference images not displayed]

FINDINGS: No intracranial enhancing lesion or bony destructive lesion to
suggest presence of intracranial metastatic disease.

No acute infarct or intracranial hemorrhage.

Global atrophy without hydrocephalus.

Remote tiny right cerebellar infarct. Minimal chronic small vessel
disease changes.

Minimal exophthalmos otherwise orbital structures unremarkable.

Moderate mucosal thickening left maxillary sinus. Minimal to mild
mucosal thickening right maxillary sinus. Minimal mucosal thickening
ethmoid sinus air cells with opacification posterior right ethmoid
sinus air cell.

Major intracranial vascular structures are patent.

Mild spinal stenosis C3-4 level. Cervical medullary junction,
pituitary and pineal region unremarkable.
IMPRESSION: No evidence of intracranial metastatic disease.

Global atrophy without hydrocephalus.

Paranasal sinus mucosal thickening as noted above.

## 2017-01-17 ENCOUNTER — Ambulatory Visit (INDEPENDENT_AMBULATORY_CARE_PROVIDER_SITE_OTHER): Payer: Medicare Other

## 2017-01-17 ENCOUNTER — Ambulatory Visit (INDEPENDENT_AMBULATORY_CARE_PROVIDER_SITE_OTHER): Payer: Medicare Other | Admitting: Orthopaedic Surgery

## 2017-01-17 DIAGNOSIS — G8929 Other chronic pain: Secondary | ICD-10-CM | POA: Diagnosis not present

## 2017-01-17 DIAGNOSIS — M25561 Pain in right knee: Secondary | ICD-10-CM | POA: Diagnosis not present

## 2017-01-17 DIAGNOSIS — M25562 Pain in left knee: Secondary | ICD-10-CM

## 2017-01-17 MED ORDER — METHYLPREDNISOLONE ACETATE 40 MG/ML IJ SUSP
40.0000 mg | INTRAMUSCULAR | Status: AC | PRN
  Administered 2017-01-17: 40 mg via INTRA_ARTICULAR

## 2017-01-17 MED ORDER — LIDOCAINE HCL 1 % IJ SOLN
2.0000 mL | INTRAMUSCULAR | Status: AC | PRN
  Administered 2017-01-17: 2 mL

## 2017-01-17 MED ORDER — DICLOFENAC SODIUM 75 MG PO TBEC
75.0000 mg | DELAYED_RELEASE_TABLET | Freq: Two times a day (BID) | ORAL | 2 refills | Status: DC
Start: 2017-01-17 — End: 2017-02-15

## 2017-01-17 MED ORDER — BUPIVACAINE HCL 0.5 % IJ SOLN
2.0000 mL | INTRAMUSCULAR | Status: AC | PRN
  Administered 2017-01-17: 2 mL via INTRA_ARTICULAR
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
## 2017-01-27 ENCOUNTER — Emergency Department (HOSPITAL_COMMUNITY): Payer: Medicare Other

## 2017-01-27 ENCOUNTER — Emergency Department (HOSPITAL_COMMUNITY)
Admission: EM | Admit: 2017-01-27 | Discharge: 2017-01-27 | Discharge status: Against Medical Advice | Payer: Medicare Other | Attending: Emergency Medicine | Admitting: Emergency Medicine

## 2017-01-27 ENCOUNTER — Encounter (HOSPITAL_COMMUNITY): Payer: Self-pay

## 2017-01-27 DIAGNOSIS — I509 Heart failure, unspecified: Secondary | ICD-10-CM | POA: Diagnosis not present

## 2017-01-27 DIAGNOSIS — R319 Hematuria, unspecified: Secondary | ICD-10-CM | POA: Insufficient documentation

## 2017-01-27 DIAGNOSIS — I10 Essential (primary) hypertension: Secondary | ICD-10-CM | POA: Diagnosis not present

## 2017-01-27 DIAGNOSIS — F17210 Nicotine dependence, cigarettes, uncomplicated: Secondary | ICD-10-CM | POA: Insufficient documentation

## 2017-01-27 DIAGNOSIS — R040 Epistaxis: Secondary | ICD-10-CM | POA: Diagnosis not present

## 2017-01-27 DIAGNOSIS — E119 Type 2 diabetes mellitus without complications: Secondary | ICD-10-CM | POA: Insufficient documentation

## 2017-01-27 DIAGNOSIS — I110 Hypertensive heart disease with heart failure: Secondary | ICD-10-CM | POA: Diagnosis not present

## 2017-01-27 DIAGNOSIS — J449 Chronic obstructive pulmonary disease, unspecified: Secondary | ICD-10-CM | POA: Diagnosis not present

## 2017-01-27 DIAGNOSIS — R310 Gross hematuria: Secondary | ICD-10-CM | POA: Diagnosis not present

## 2017-01-27 DIAGNOSIS — Z955 Presence of coronary angioplasty implant and graft: Secondary | ICD-10-CM | POA: Insufficient documentation

## 2017-01-27 DIAGNOSIS — C3490 Malignant neoplasm of unspecified part of unspecified bronchus or lung: Secondary | ICD-10-CM | POA: Diagnosis not present

## 2017-01-27 LAB — CBC WITH DIFFERENTIAL/PLATELET
BASOS PCT: 1 %
Basophils Absolute: 0 10*3/uL (ref 0.0–0.1)
Eosinophils Absolute: 0.1 10*3/uL (ref 0.0–0.7)
Eosinophils Relative: 2 %
HEMATOCRIT: 48.6 % (ref 39.0–52.0)
HEMOGLOBIN: 16.7 g/dL (ref 13.0–17.0)
Lymphocytes Relative: 43 %
Lymphs Abs: 2.9 10*3/uL (ref 0.7–4.0)
MCH: 28.3 pg (ref 26.0–34.0)
MCHC: 34.4 g/dL (ref 30.0–36.0)
MCV: 82.4 fL (ref 78.0–100.0)
Monocytes Absolute: 0.5 10*3/uL (ref 0.1–1.0)
Monocytes Relative: 7 %
NEUTROS ABS: 3.1 10*3/uL (ref 1.7–7.7)
NEUTROS PCT: 47 %
Platelets: 150 10*3/uL (ref 150–400)
RBC: 5.9 MIL/uL — AB (ref 4.22–5.81)
RDW: 18 % — ABNORMAL HIGH (ref 11.5–15.5)
WBC: 6.6 10*3/uL (ref 4.0–10.5)

## 2017-01-27 LAB — COMPREHENSIVE METABOLIC PANEL
ALBUMIN: 4 g/dL (ref 3.5–5.0)
ALK PHOS: 82 U/L (ref 38–126)
ALT: 68 U/L — ABNORMAL HIGH (ref 17–63)
ANION GAP: 10 (ref 5–15)
AST: 60 U/L — ABNORMAL HIGH (ref 15–41)
BILIRUBIN TOTAL: 1.1 mg/dL (ref 0.3–1.2)
BUN: 6 mg/dL (ref 6–20)
CALCIUM: 8.7 mg/dL — AB (ref 8.9–10.3)
CO2: 24 mmol/L (ref 22–32)
Chloride: 105 mmol/L (ref 101–111)
Creatinine, Ser: 0.77 mg/dL (ref 0.61–1.24)
GFR calc Af Amer: >60 mL/min (ref >60)
GLUCOSE: 114 mg/dL — AB (ref 65–99)
POTASSIUM: 4.8 mmol/L (ref 3.5–5.1)
Sodium: 139 mmol/L (ref 135–145)
Total Protein: 7.8 g/dL (ref 6.5–8.1)

## 2017-01-27 LAB — LIPASE, BLOOD: Lipase: 40 U/L (ref 11–51)

## 2017-01-27 LAB — URINALYSIS, ROUTINE W REFLEX MICROSCOPIC
Bilirubin Urine: NEGATIVE
Glucose, UA: NEGATIVE mg/dL
Hgb urine dipstick: NEGATIVE
Ketones, ur: NEGATIVE mg/dL
Leukocytes, UA: NEGATIVE
NITRITE: NEGATIVE
PH: 5 (ref 5.0–8.0)
Protein, ur: NEGATIVE mg/dL
SPECIFIC GRAVITY, URINE: 1.003 — AB (ref 1.005–1.030)

## 2017-01-27 LAB — ETHANOL: Alcohol, Ethyl (B): 304 mg/dL (ref <5)

## 2017-01-27 LAB — BRAIN NATRIURETIC PEPTIDE: B NATRIURETIC PEPTIDE 5: 15.5 pg/mL (ref 0.0–100.0)

## 2017-01-27 LAB — TROPONIN I

## 2017-01-27 MED ORDER — SODIUM CHLORIDE 0.9 % IV BOLUS (SEPSIS)
1000.0000 mL | Freq: Once | INTRAVENOUS | Status: AC
  Administered 2017-01-27: 1000 mL via INTRAVENOUS

## 2017-01-27 MED ORDER — SALINE SPRAY 0.65 % NA SOLN
1.0000 | Freq: Once | NASAL | Status: DC
  Filled 2017-01-27 (×2): qty 44
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
## 2017-01-27 NOTE — ED Notes (Signed)
Pt has asked three times to leave AMA. Pt encouraged to stay for results and complete fluids and then we will reevaluate his plan of care. Pt is persistent with desire to leave. MD Vanita Panda made aware. Stated he would come and speak with the patient. Patient stated, "I just want a cigarette. " Pt offered to get a Nicotine patch per MD approval, but patient refused three times. Continues to state he would like to leave AMA.

## 2017-01-27 NOTE — ED Notes (Signed)
MD Vanita Panda at the bedside speaking with patient about leaving AMA

## 2017-01-27 NOTE — ED Triage Notes (Signed)
Per [facility], Pt is coming from home with complaints of Epistaxis and Hematuria for a couple days. Bleeding was controlled when EMS arrived. Pt was being moved out of the house when he started to complain of Chest pain and SOB along with near-syncope. Vitals per EMS: 146/100, 90 HR, 99% on 4L, 127 CBG, 18 RR.

## 2017-01-27 NOTE — ED Notes (Addendum)
Pt was alert to person, place, situation, and month when questioned. Pt spoke with MD and verbalized understanding of risk for leaving. Pt was consistent on leaving. Pt signed AMA and ambulated steadily out the door.

## 2017-01-27 NOTE — ED Notes (Signed)
Pt taken to Xray.

## 2017-01-30 ENCOUNTER — Telehealth: Payer: Self-pay | Admitting: Neurology

## 2017-01-30 NOTE — Telephone Encounter (Signed)
Please advise patient, he may want to ask PCP for a referral to sleep consultation/sleep study outside of [facility].

## 2017-01-30 NOTE — Telephone Encounter (Signed)
Patients [employer] plan is out of network with [facility].  I called the patient and gave information.  Patient decided he couldn't afford a split sleep study or a HST.

## 2017-02-01 ENCOUNTER — Other Ambulatory Visit: Payer: Self-pay | Admitting: Internal Medicine

## 2017-02-15 ENCOUNTER — Encounter: Payer: Self-pay | Admitting: Internal Medicine

## 2017-02-15 ENCOUNTER — Ambulatory Visit (INDEPENDENT_AMBULATORY_CARE_PROVIDER_SITE_OTHER): Payer: Medicare Other | Admitting: Internal Medicine

## 2017-02-15 ENCOUNTER — Other Ambulatory Visit (INDEPENDENT_AMBULATORY_CARE_PROVIDER_SITE_OTHER): Payer: Medicare Other

## 2017-02-15 VITALS — BP 130/82 | HR 80 | Temp 98.2°F | Resp 16 | Ht 75.0 in | Wt 196.5 lb

## 2017-02-15 DIAGNOSIS — R040 Epistaxis: Secondary | ICD-10-CM | POA: Diagnosis not present

## 2017-02-15 DIAGNOSIS — R7989 Other specified abnormal findings of blood chemistry: Secondary | ICD-10-CM | POA: Diagnosis not present

## 2017-02-15 DIAGNOSIS — R945 Abnormal results of liver function studies: Principal | ICD-10-CM

## 2017-02-15 DIAGNOSIS — G4731 Primary central sleep apnea: Secondary | ICD-10-CM

## 2017-02-15 DIAGNOSIS — G4730 Sleep apnea, unspecified: Secondary | ICD-10-CM

## 2017-02-15 DIAGNOSIS — G4700 Insomnia, unspecified: Secondary | ICD-10-CM | POA: Diagnosis not present

## 2017-02-15 DIAGNOSIS — J449 Chronic obstructive pulmonary disease, unspecified: Secondary | ICD-10-CM | POA: Diagnosis not present

## 2017-02-15 LAB — COMPREHENSIVE METABOLIC PANEL
ALK PHOS: 79 U/L (ref 39–117)
ALT: 82 U/L — AB (ref 0–53)
AST: 47 U/L — ABNORMAL HIGH (ref 0–37)
Albumin: 4.3 g/dL (ref 3.5–5.2)
BILIRUBIN TOTAL: 0.5 mg/dL (ref 0.2–1.2)
BUN: 9 mg/dL (ref 6–23)
CALCIUM: 9.6 mg/dL (ref 8.4–10.5)
CO2: 25 mEq/L (ref 19–32)
Chloride: 108 mEq/L (ref 96–112)
Creatinine, Ser: 0.87 mg/dL (ref 0.40–1.50)
GFR: 116.3 mL/min (ref >60.00)
GLUCOSE: 111 mg/dL — AB (ref 70–99)
Potassium: 4.1 mEq/L (ref 3.5–5.1)
Sodium: 141 mEq/L (ref 135–145)
TOTAL PROTEIN: 7.6 g/dL (ref 6.0–8.3)

## 2017-02-15 MED ORDER — ESZOPICLONE 3 MG PO TABS: 3.0000 mg | ORAL_TABLET | Freq: Every day | ORAL | 3 refills | Status: DC

## 2017-02-15 MED ORDER — UMECLIDINIUM-VILANTEROL 62.5-25 MCG/INH IN AEPB: 1.0000 | INHALATION_SPRAY | Freq: Every day | RESPIRATORY_TRACT | 11 refills | Status: DC

## 2017-02-15 NOTE — Patient Instructions (Signed)
Chronic Obstructive Pulmonary Disease Chronic obstructive pulmonary disease (COPD) is a common lung condition in which airflow from the lungs is limited. COPD is a general term that can be used to describe many different lung problems that limit airflow, including both chronic bronchitis and emphysema. If you have COPD, your lung function will probably never return to normal, but there are measures you can take to improve lung function and make yourself feel better. What are the causes?  Smoking (common).  Exposure to secondhand smoke.  Genetic problems.  Chronic inflammatory lung diseases or recurrent infections. What are the signs or symptoms?  Shortness of breath, especially with physical activity.  Deep, persistent (chronic) cough with a large amount of thick mucus.  Wheezing.  Rapid breaths (tachypnea).  Gray or bluish discoloration (cyanosis) of the skin, especially in your fingers, toes, or lips.  Fatigue.  Weight loss.  Frequent infections or episodes when breathing symptoms become much worse (exacerbations).  Chest tightness. How is this diagnosed? Your health care provider will take a medical history and perform a physical examination to diagnose COPD. Additional tests for COPD may include:  Lung (pulmonary) function tests.  Chest X-ray.  CT scan.  Blood tests. How is this treated? Treatment for COPD may include:  Inhaler and nebulizer medicines. These help manage the symptoms of COPD and make your breathing more comfortable.  Supplemental oxygen. Supplemental oxygen is only helpful if you have a low oxygen level in your blood.  Exercise and physical activity. These are beneficial for nearly all people with COPD.  Lung surgery or transplant.  Nutrition therapy to gain weight, if you are underweight.  Pulmonary rehabilitation. This may involve working with a team of health care providers and specialists, such as respiratory, occupational, and physical  therapists. Follow these instructions at home:  Take all medicines (inhaled or pills) as directed by your health care provider.  Avoid over-the-counter medicines or cough syrups that dry up your airway (such as antihistamines) and slow down the elimination of secretions unless instructed otherwise by your health care provider.  If you are a smoker, the most important thing that you can do is stop smoking. Continuing to smoke will cause further lung damage and breathing trouble. Ask your health care provider for help with quitting smoking. He or she can direct you to community resources or hospitals that provide support.  Avoid exposure to irritants such as smoke, chemicals, and fumes that aggravate your breathing.  Use oxygen therapy and pulmonary rehabilitation if directed by your health care provider. If you require home oxygen therapy, ask your health care provider whether you should purchase a pulse oximeter to measure your oxygen level at home.  Avoid contact with individuals who have a contagious illness.  Avoid extreme temperature and humidity changes.  Eat healthy foods. Eating smaller, more frequent meals and resting before meals may help you maintain your strength.  Stay active, but balance activity with periods of rest. Exercise and physical activity will help you maintain your ability to do things you want to do.  Preventing infection and hospitalization is very important when you have COPD. Make sure to receive all the vaccines your health care provider recommends, especially the pneumococcal and influenza vaccines. Ask your health care provider whether you need a pneumonia vaccine.  Learn and use relaxation techniques to manage stress.  Learn and use controlled breathing techniques as directed by your health care provider. Controlled breathing techniques include: 1. Pursed lip breathing. Start by breathing in (inhaling)   through your nose for 1 second. Then, purse your lips as  if you were going to whistle and breathe out (exhale) through the pursed lips for 2 seconds. 2. Diaphragmatic breathing. Start by putting one hand on your abdomen just above your waist. Inhale slowly through your nose. The hand on your abdomen should move out. Then purse your lips and exhale slowly. You should be able to feel the hand on your abdomen moving in as you exhale.  Learn and use controlled coughing to clear mucus from your lungs. Controlled coughing is a series of short, progressive coughs. The steps of controlled coughing are: 1. Lean your head slightly forward. 2. Breathe in deeply using diaphragmatic breathing. 3. Try to hold your breath for 3 seconds. 4. Keep your mouth slightly open while coughing twice. 5. Spit any mucus out into a tissue. 6. Rest and repeat the steps once or twice as needed. Contact a health care provider if:  You are coughing up more mucus than usual.  There is a change in the color or thickness of your mucus.  Your breathing is more labored than usual.  Your breathing is faster than usual. Get help right away if:  You have shortness of breath while you are resting.  You have shortness of breath that prevents you from:  Being able to talk.  Performing your usual physical activities.  You have chest pain lasting longer than 5 minutes.  Your skin color is more cyanotic than usual.  You measure low oxygen saturations for longer than 5 minutes with a pulse oximeter. This information is not intended to replace advice given to you by your health care provider. Make sure you discuss any questions you have with your health care provider. Document Released: 06/22/2005 Document Revised: 02/18/2016 Document Reviewed: 05/09/2013 Elsevier Interactive Patient Education  2017 [employer]  

## 2017-02-15 NOTE — Progress Notes (Signed)
Subjective:  Patient ID: [name], male    DOB: [birth year]  Age: 56 y.o. MRN: [id number]  CC: COPD   HPI [name] presents for f/up - He complains of recurrent episodes of left-sided nosebleeds. He iha had this off and on since childhood but it has been worse for the last few weeks. He was seen in the ED about 2 or 3 weeks ago for this and at that time he had a blood alcohol level of just 300. He tells me that he has significantly decreased his alcohol intake to just a few beverages per day. While in the ER he was found to have mildly elevated liver enzymes.   He also tells me that he needs a referral to a different sleep Center for second opinion about sleep apnea.   Outpatient Medications Prior to Visit  Medication Sig Dispense Refill  . albuterol (PROVENTIL HFA;VENTOLIN HFA) 108 (90 Base) MCG/ACT inhaler Inhale 1-2 puffs into the lungs every 6 (six) hours as needed for wheezing or shortness of breath. 1 Inhaler 11  . carvedilol (COREG) 12.5 MG tablet Take 1 tablet (12.5 mg total) by mouth 2 (two) times daily with a meal. 180 tablet 1  . cetirizine (ZYRTEC) 10 MG tablet Take 1 tablet (10 mg total) by mouth daily. (Patient taking differently: Take 10 mg by mouth daily as needed for allergies. ) 90 tablet 3  . emtricitabine-tenofovir (TRUVADA) 200-300 MG tablet Take 1 tablet by mouth daily. 30 tablet 3  . esomeprazole (NEXIUM) 40 MG capsule Take 40 mg by mouth daily.    . Pitavastatin Calcium (LIVALO) 2 MG TABS Take 2 mg by mouth daily.    . QUEtiapine (SEROQUEL) 100 MG tablet Take 100 mg by mouth at bedtime.    Marland Kitchen VIIBRYD 40 MG TABS Take 1 tablet (40 mg total) by mouth daily. 30 tablet 5  . eszopiclone (LUNESTA) 2 MG TABS tablet Take 1 tablet (2 mg total) by mouth at bedtime as needed for sleep. Take immediately before bedtime (Patient taking differently: Take 2 mg by mouth at bedtime. Take immediately before bedtime) 30 tablet 3  . Multiple Vitamin (MULTIVITAMIN) tablet Take 1  tablet by mouth daily.    Marland Kitchen umeclidinium-vilanterol (ANORO ELLIPTA) 62.5-25 MCG/INH AEPB Inhale 1 puff into the lungs daily.    . diclofenac (VOLTAREN) 75 MG EC tablet Take 1 tablet (75 mg total) by mouth 2 (two) times daily. (Patient not taking: Reported on 01/27/2017) 30 tablet 2   No facility-administered medications prior to visit.     ROS Review of Systems  Constitutional: Negative.   HENT: Positive for nosebleeds. Negative for congestion, postnasal drip, sinus pressure, sore throat and trouble swallowing.   Eyes: Negative.   Respiratory: Positive for apnea. Negative for cough, chest tightness, shortness of breath and wheezing.   Cardiovascular: Negative for chest pain, palpitations and leg swelling.  Gastrointestinal: Negative for abdominal pain, constipation, diarrhea, nausea and vomiting.  Endocrine: Negative.   Genitourinary: Negative.  Negative for difficulty urinating.  Musculoskeletal: Negative.  Negative for back pain and myalgias.  Skin: Negative.  Negative for color change and rash.  Allergic/Immunologic: Negative.   Neurological: Negative.  Negative for dizziness and weakness.  Hematological: Negative for adenopathy. Does not bruise/bleed easily.  Psychiatric/Behavioral: Positive for sleep disturbance. Negative for decreased concentration, dysphoric mood and suicidal ideas. The patient is not nervous/anxious.     Objective:  BP 130/82 (BP Location: Left Arm, Patient Position: Sitting, Cuff Size: Normal)  Pulse 80   Temp 98.2 F (36.8 C) (Oral)   Resp 16   Ht 6\' 3"  (1.905 m)   Wt 196 lb 8 oz (89.1 kg)   SpO2 100%   BMI 24.56 kg/m   BP Readings from Last 3 Encounters:  02/15/17 130/82  01/27/17 112/90  01/11/17 (!) 146/90    Wt Readings from Last 3 Encounters:  02/15/17 196 lb 8 oz (89.1 kg)  01/27/17 203 lb (92.1 kg)  01/11/17 203 lb (92.1 kg)    Physical Exam  Constitutional: He is oriented to person, place, and time. No distress.  HENT:  Nose: No  mucosal edema, rhinorrhea, sinus tenderness or nasal deformity. No epistaxis.  No foreign bodies. Right sinus exhibits no maxillary sinus tenderness and no frontal sinus tenderness. Left sinus exhibits no maxillary sinus tenderness and no frontal sinus tenderness.  Mouth/Throat: Oropharynx is clear and moist. No oropharyngeal exudate.  Eyes: Conjunctivae are normal. Right eye exhibits no discharge. Left eye exhibits no discharge. No scleral icterus.  Neck: Normal range of motion. Neck supple. No JVD present. No tracheal deviation present. No thyromegaly present.  Cardiovascular: Normal rate, regular rhythm, normal heart sounds and intact distal pulses.  Exam reveals no gallop and no friction rub.   No murmur heard. Pulmonary/Chest: Effort normal and breath sounds normal. No stridor. No respiratory distress. He has no wheezes. He has no rales. He exhibits no tenderness.  Abdominal: Soft. Bowel sounds are normal. He exhibits no distension and no mass. There is no tenderness. There is no rebound and no guarding.  Musculoskeletal: Normal range of motion. He exhibits no edema, tenderness or deformity.  Lymphadenopathy:    He has no cervical adenopathy.  Neurological: He is oriented to person, place, and time.  Skin: Skin is warm and dry. No rash noted. He is not diaphoretic. No erythema. No pallor.  Psychiatric: He has a normal mood and affect. His behavior is normal. Judgment and thought content normal.  Vitals reviewed.   Lab Results  Component Value Date   WBC 6.6 01/27/2017   HGB 16.7 01/27/2017   HCT 48.6 01/27/2017   PLT 150 01/27/2017   GLUCOSE 111 (H) 02/15/2017   CHOL 184 11/30/2016   TRIG 230.0 (H) 11/30/2016   HDL 37.50 (L) 11/30/2016   LDLDIRECT 122.0 11/30/2016   LDLCALC 79 08/11/2015   ALT 82 (H) 02/15/2017   AST 47 (H) 02/15/2017   NA 141 02/15/2017   K 4.1 02/15/2017   CL 108 02/15/2017   CREATININE 0.87 02/15/2017   BUN 9 02/15/2017   CO2 25 02/15/2017   TSH 0.56  11/30/2016   PSA 0.60 11/30/2016   INR 1.03 08/26/2015   HGBA1C 6.2 11/30/2016   MICROALBUR 0.2 10/08/2008    Dg Chest 2 View  Result Date: 01/27/2017 CLINICAL DATA:  Blood coming out of the penis.  Lung cancer. EXAM: CHEST  2 VIEW COMPARISON:  10/11/2016. FINDINGS: RIGHT base scarring. No active infiltrates or failure. No lung masses seen within limits for detection due to overlying telemetry leads. Stable cardiomediastinal silhouette. No visible pneumothorax. IMPRESSION: RIGHT base scarring appears stable. Electronically Signed   By: Staci Righter M.D.   On: 01/27/2017 10:28    Assessment & Plan:   Buddie was seen today for copd.  Diagnoses and all orders for this visit:  LFT elevation- his liver enzymes remain very mildly elevated, he has positive immunity to hepatitis A and B but no evidence of active hep A or B infection,  screening for hepatitis C is negative. This is most likely related to alcohol abuse. I have encouraged him to abstain from alcohol intake. -     Comprehensive metabolic panel; Future -     Hepatitis A antibody, total; Future -     Hepatitis B core antibody, total; Future -     Hepatitis B surface antibody; Future -     Hepatitis C antibody; Future -     Hepatitis B surface antigen; Future  Left-sided epistaxis- examination of the area today is normal. I've asked him to see ENT to see if there is a vessel that needs to be cauterized and to screen for nasopharyngeal carcinoma since he has a history of tobacco abuse. -     Ambulatory referral to ENT  Sleep apnea, primary central -     Ambulatory referral to Sleep Studies  Insomnia w/ sleep apnea -     Eszopiclone 3 MG TABS; Take 1 tablet (3 mg total) by mouth at bedtime. Take immediately before bedtime  COPD GOLD 0 copd  -     umeclidinium-vilanterol (ANORO ELLIPTA) 62.5-25 MCG/INH AEPB; Inhale 1 puff into the lungs daily.   I have discontinued Mr. Quant eszopiclone, diclofenac, and multivitamin. I am also  having him start on Eszopiclone. Additionally, I am having him maintain his cetirizine, albuterol, esomeprazole, Pitavastatin Calcium, QUEtiapine, emtricitabine-tenofovir, VIIBRYD, carvedilol, and umeclidinium-vilanterol.  Meds ordered this encounter  Medications  . Eszopiclone 3 MG TABS    Sig: Take 1 tablet (3 mg total) by mouth at bedtime. Take immediately before bedtime    Dispense:  30 tablet    Refill:  3  . umeclidinium-vilanterol (ANORO ELLIPTA) 62.5-25 MCG/INH AEPB    Sig: Inhale 1 puff into the lungs daily.    Dispense:  30 each    Refill:  11     Follow-up: Return in about 3 months (around 05/18/2017).  Scarlette Calico, MD

## 2017-02-16 ENCOUNTER — Encounter: Payer: Self-pay | Admitting: Internal Medicine

## 2017-02-16 LAB — HEPATITIS B SURFACE ANTIGEN: Hepatitis B Surface Ag: NEGATIVE

## 2017-02-16 LAB — HEPATITIS B CORE ANTIBODY, TOTAL: HEP B C TOTAL AB: REACTIVE — AB

## 2017-02-16 LAB — HEPATITIS A ANTIBODY, TOTAL: Hep A Total Ab: REACTIVE — AB

## 2017-02-16 LAB — HEPATITIS C ANTIBODY: HCV AB: NEGATIVE

## 2017-02-16 LAB — HEPATITIS B SURFACE ANTIBODY,QUALITATIVE: HEP B S AB: POSITIVE — AB

## 2017-02-20 IMAGING — CR DG CHEST 2V
2 series · 2 of 2 positions shown · non-contrast
Comparison: CT chest 10/01/2015. PA and lateral chest 09/30/2015
and 09/17/2015.

CLINICAL DATA: Shortness of breath. Chest pain. History of lung
cancer. Subsequent encounter.

EXAM:
CHEST  2 VIEW

[chest pa]
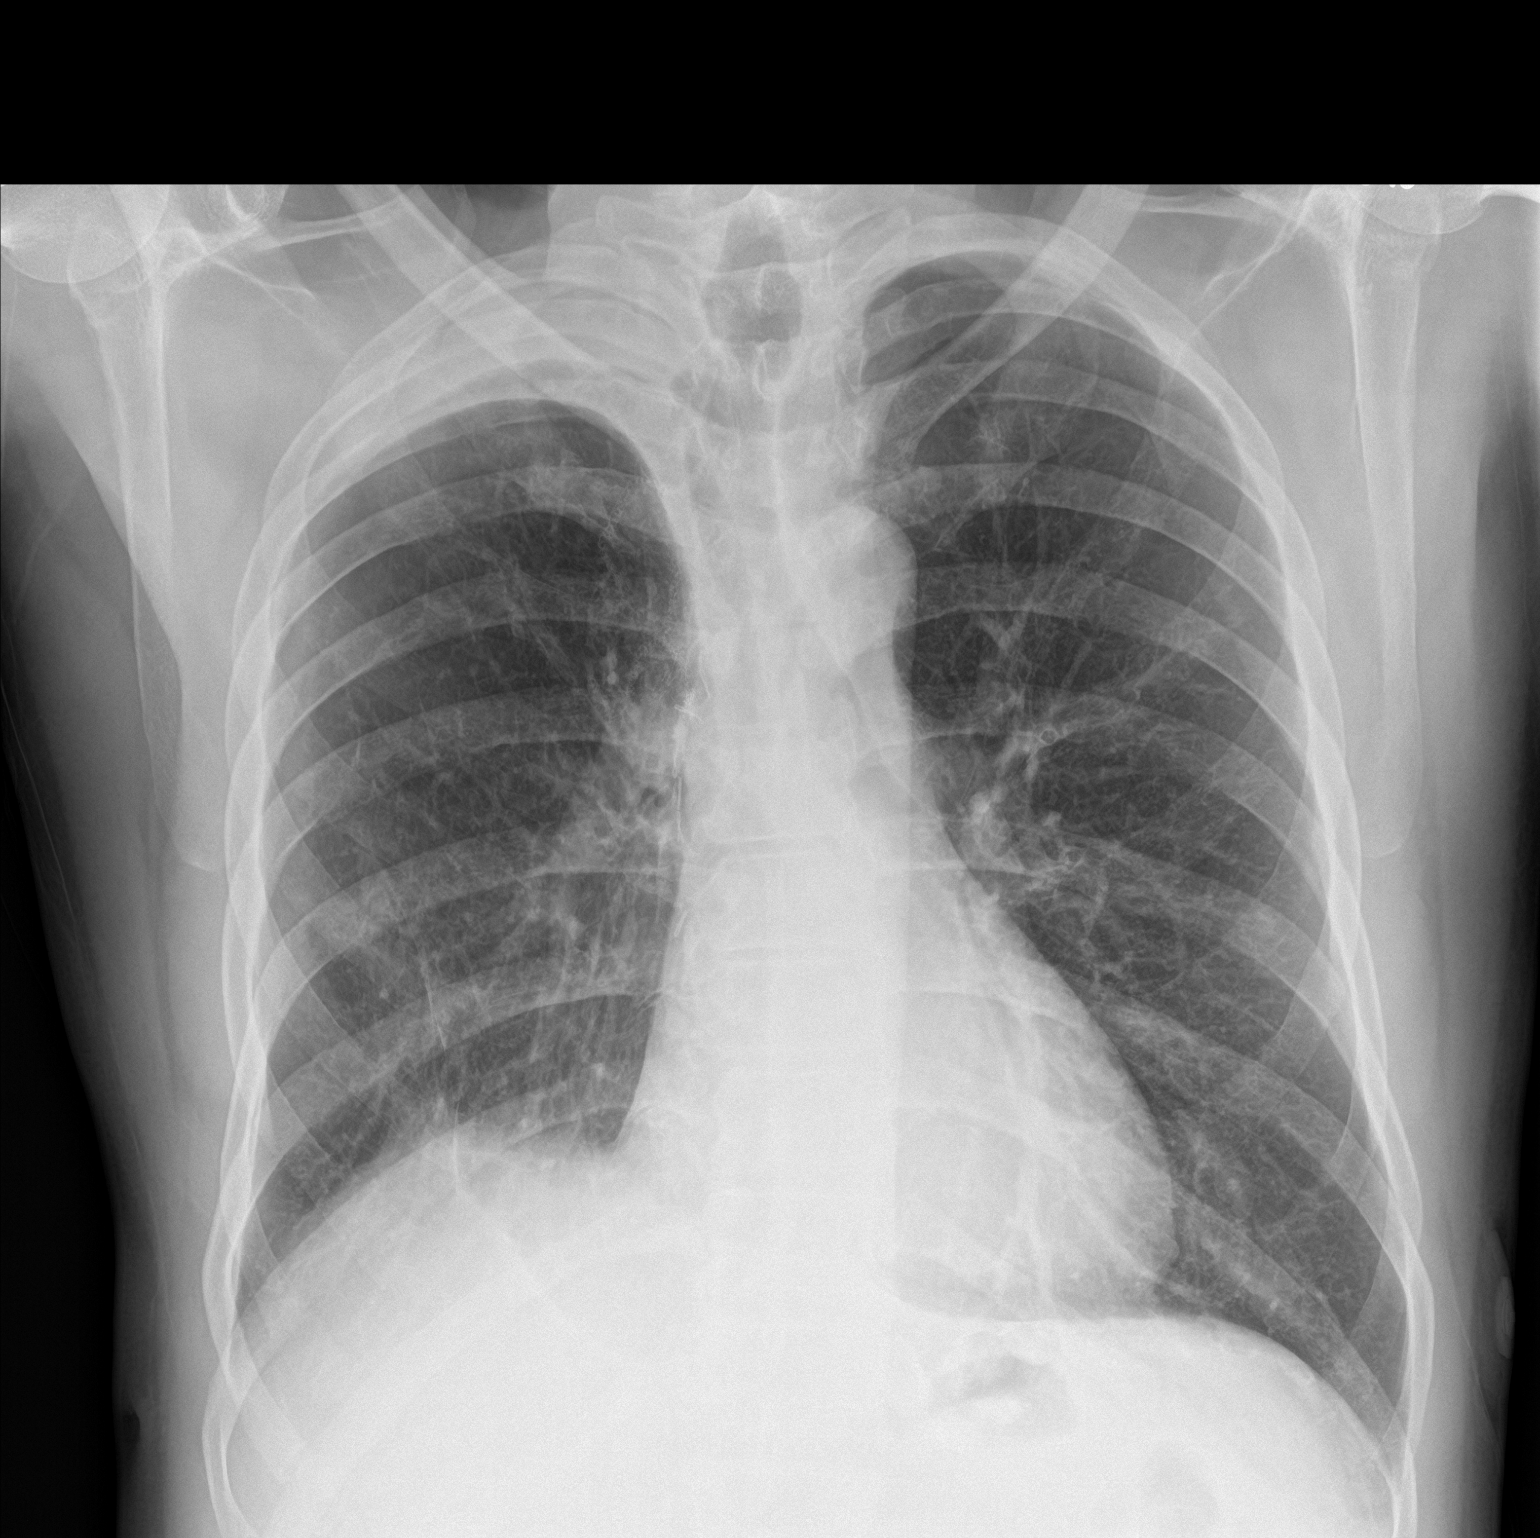

[chest lat]
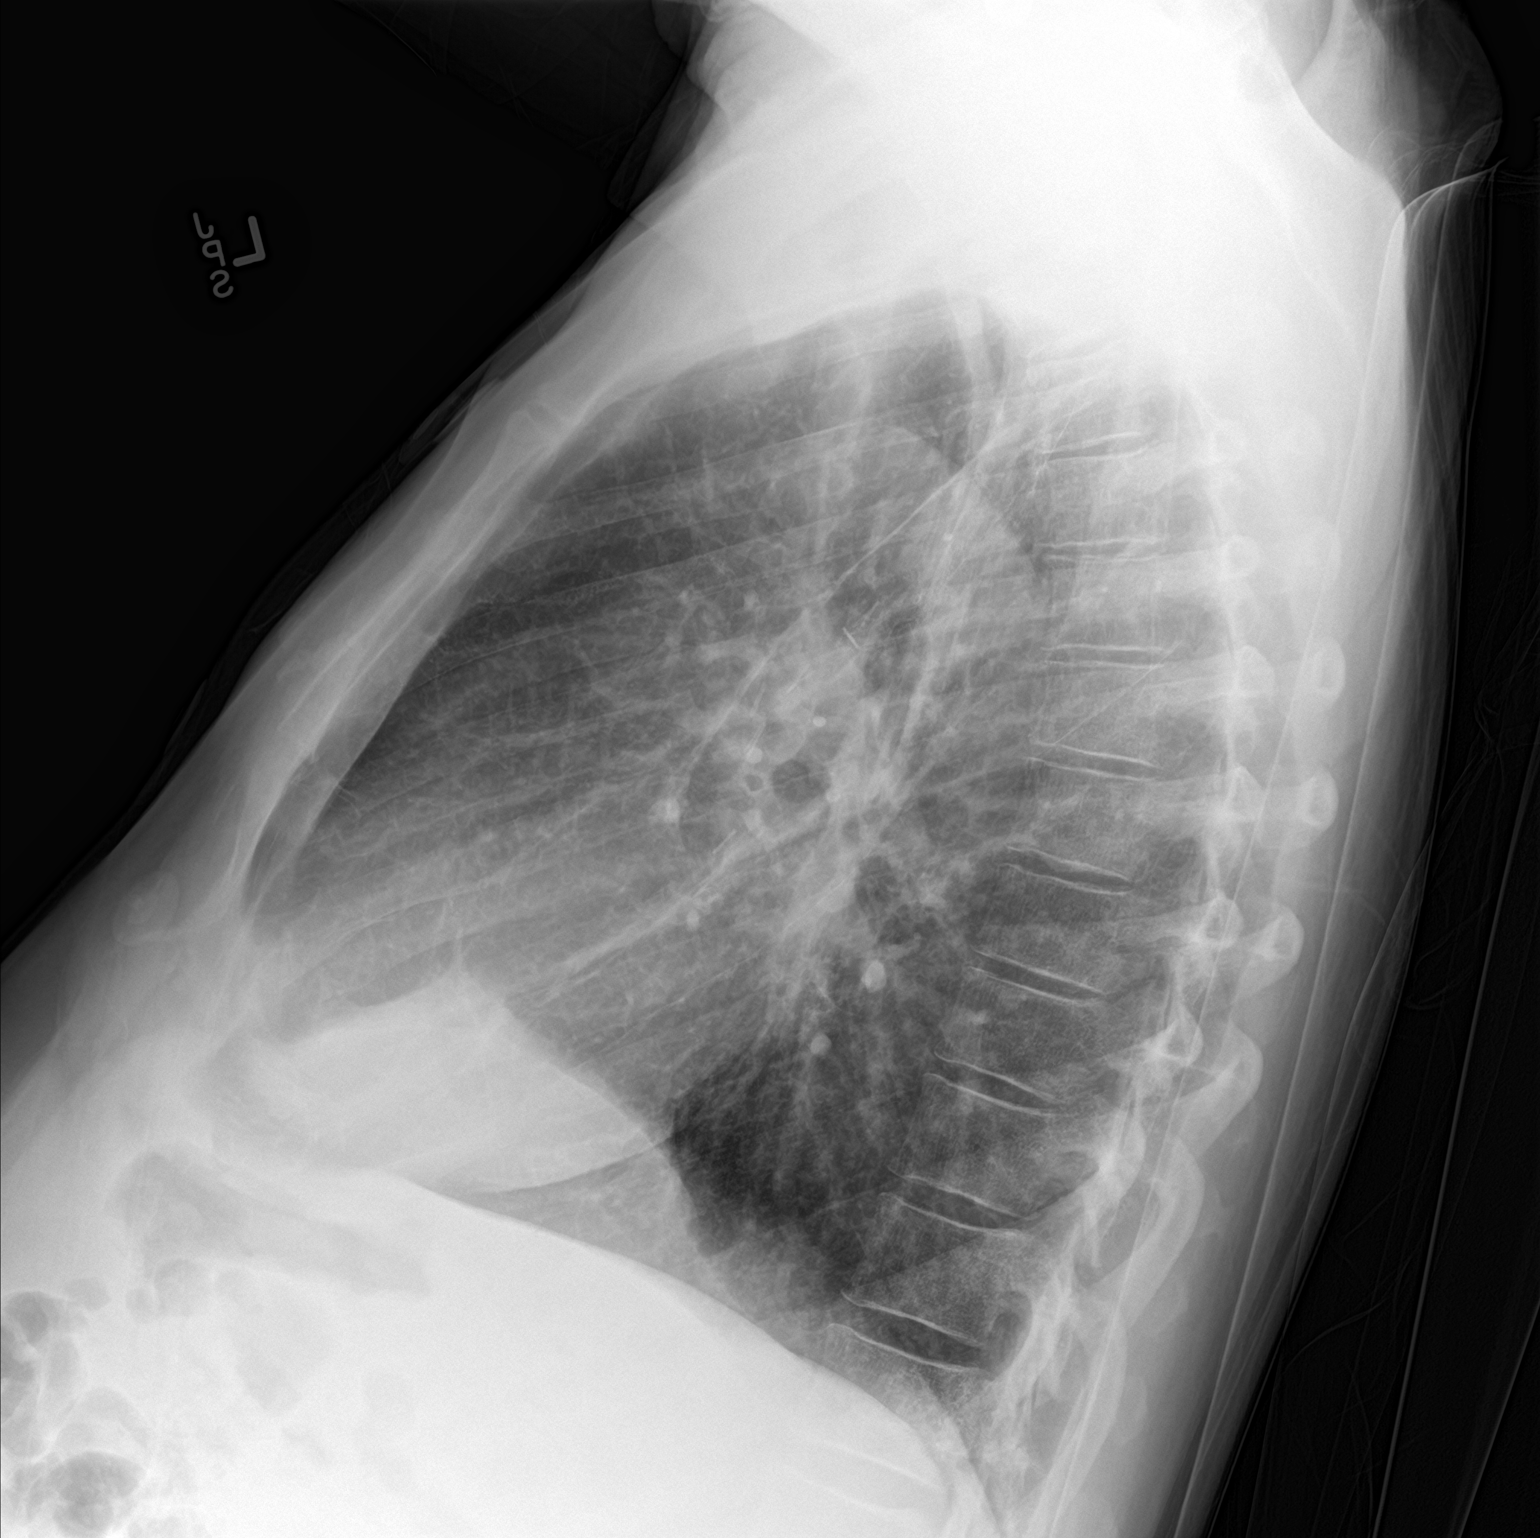

[2 of 2 positions shown; findings below may reference images not displayed]

FINDINGS: There is volume loss in the right chest with tenting of the right
hemidiaphragm. Right pneumothorax seen on the prior study has
resolved. Small amount of pleural fluid and/or scarring is seen in
the right apex. No consolidative process or pneumothorax is
identified. Heart size is normal. No focal bony abnormality.
IMPRESSION: No acute disease. Postoperative change right chest as described
above.

## 2017-02-20 IMAGING — CT CT ANGIO CHEST
2 of 6 series · 18 of 36 positions shown · IV contrast (Omni 300)
Comparison: 10/01/2015

CLINICAL DATA: Right-sided chest pain and shortness of breath,
history of prior right lung resection for carcinoma

EXAM:
CT ANGIOGRAPHY CHEST WITH CONTRAST
TECHNIQUE: Multidetector CT imaging of the chest was performed using the
standard protocol during bolus administration of intravenous
contrast. Multiplanar CT image reconstructions and MIPs were
obtained to evaluate the vascular anatomy.
CONTRAST:  100mL OMNIPAQUE IOHEXOL 350 MG/ML SOLN

[Series 6: pe thins · axial · 0.81mm/px · z∈[+1051,+1336]mm · 17 of 629 slices shown]
[im 30/629  lung]
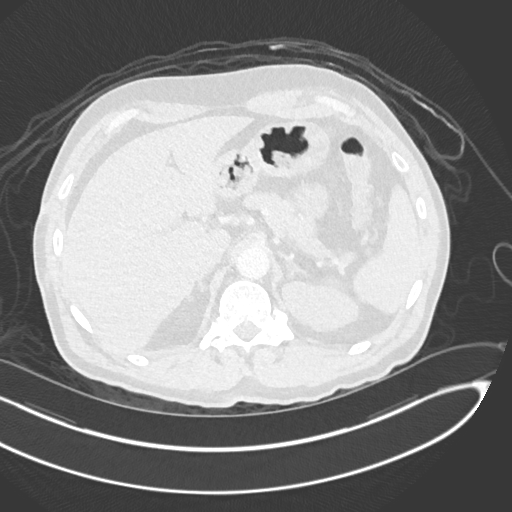
[im 60/629  mediastinal]
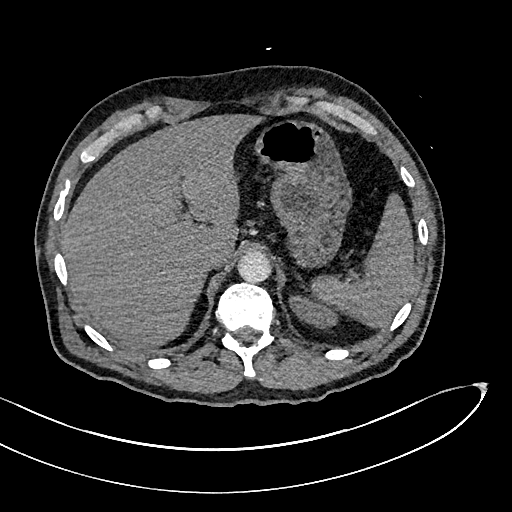
[im 90/629  lung]
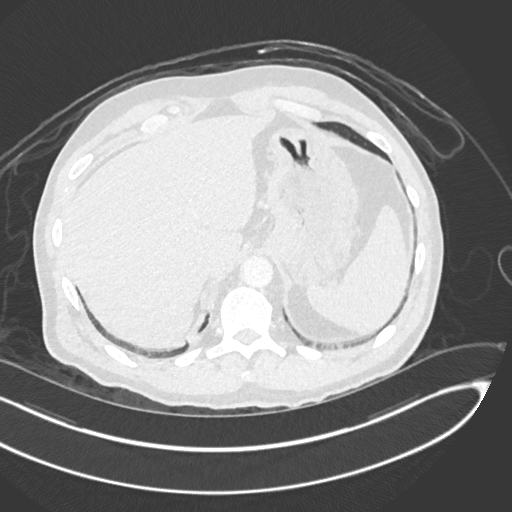
[im 150/629  mediastinal]
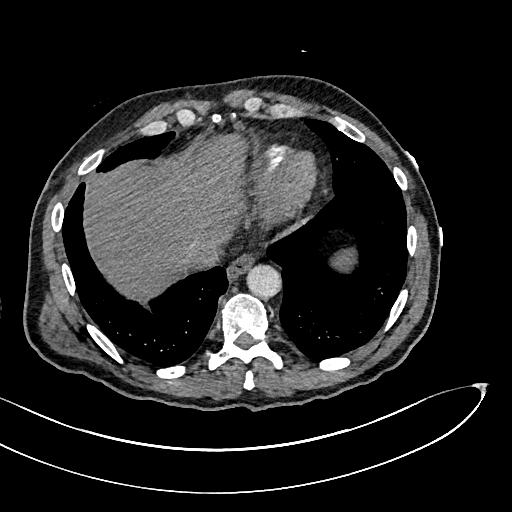
[im 180/629  lung]
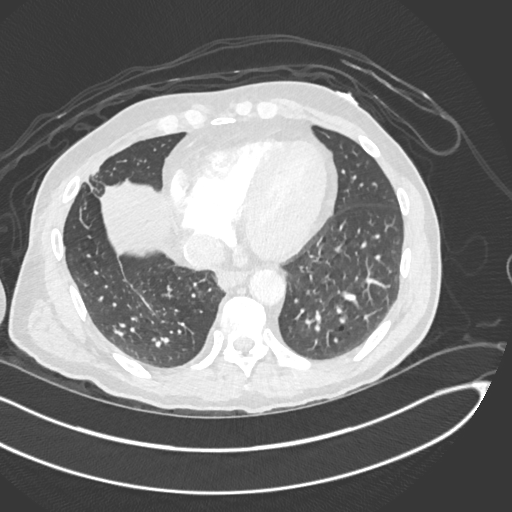
[im 210/629  mediastinal]
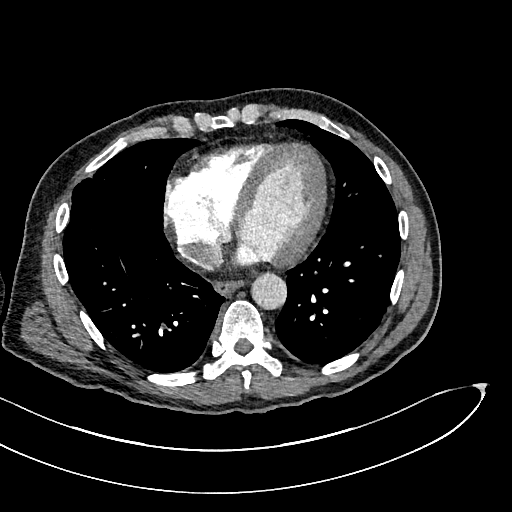
[im 240/629  lung]
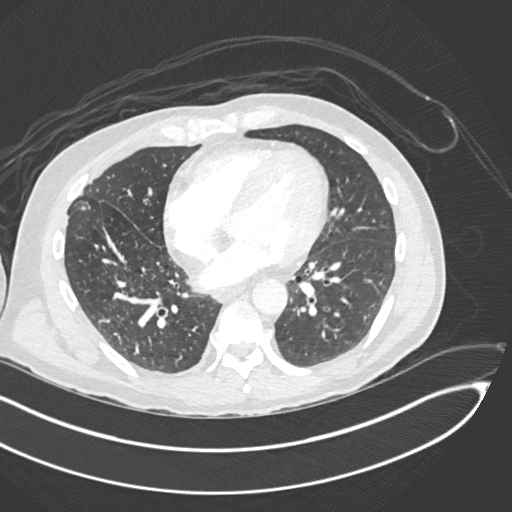
[im 270/629  mediastinal]
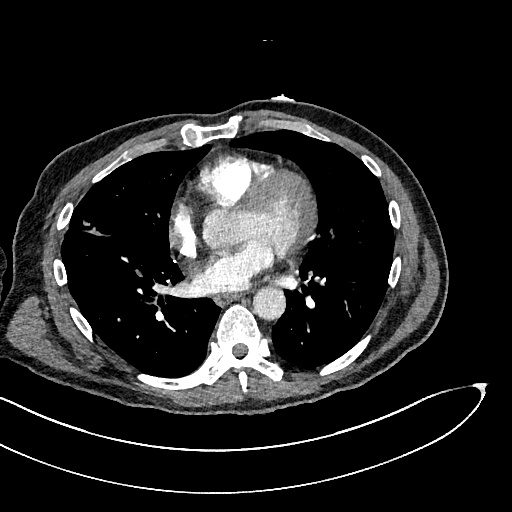
[im 329/629  lung]
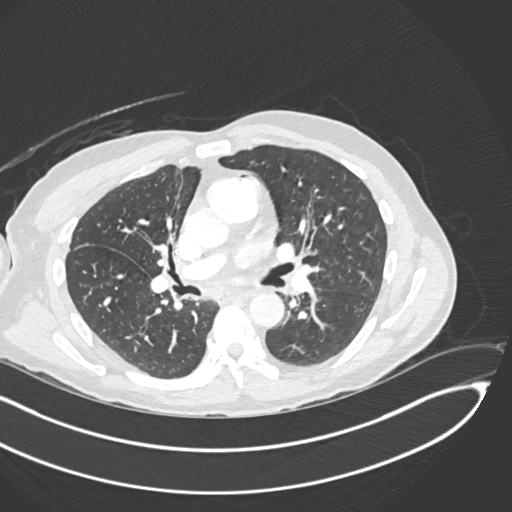
[im 359/629  mediastinal]
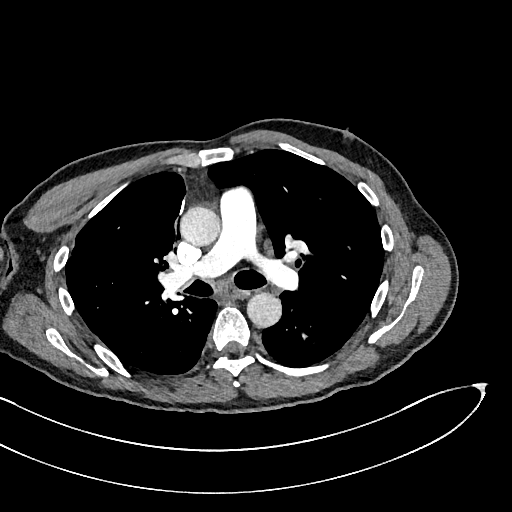
[im 389/629  lung]
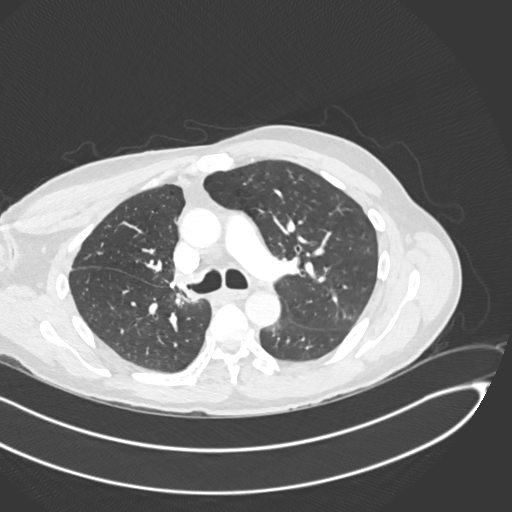
[im 419/629  mediastinal]
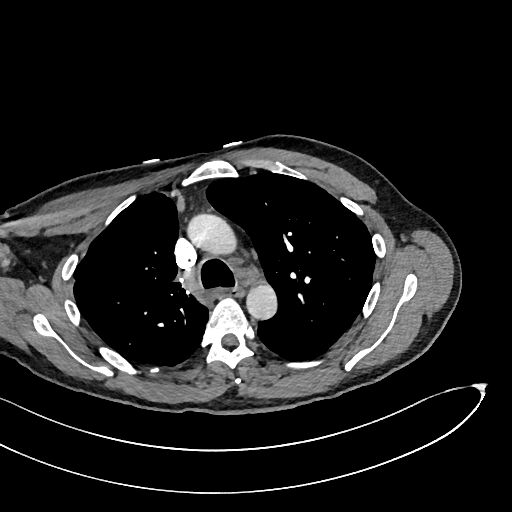
[im 449/629  lung]
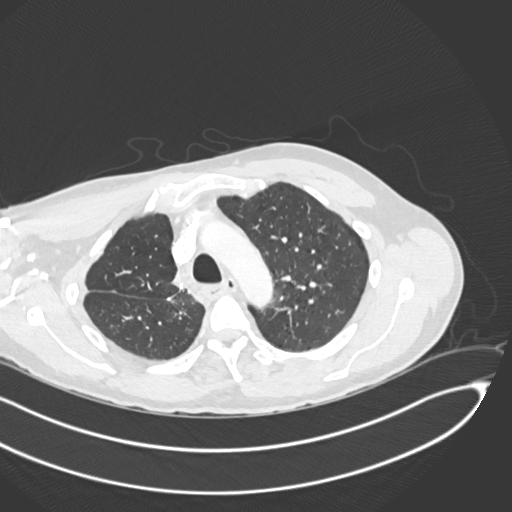
[im 479/629  mediastinal]
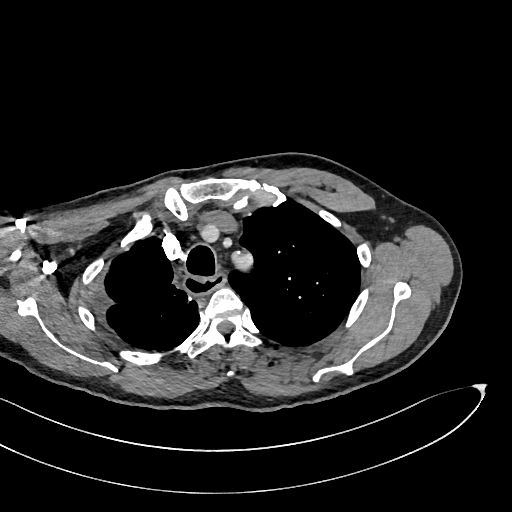
[im 539/629  lung]
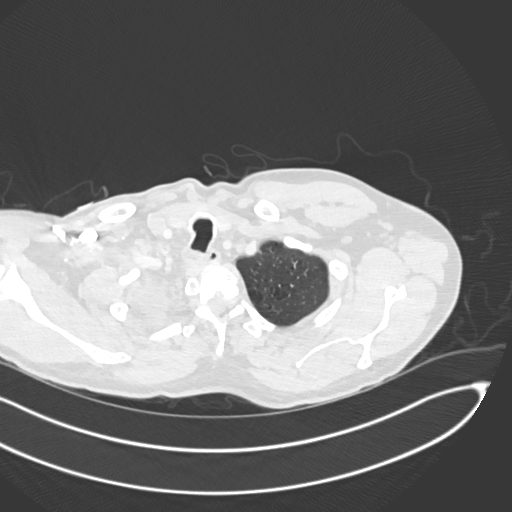
[im 569/629  mediastinal]
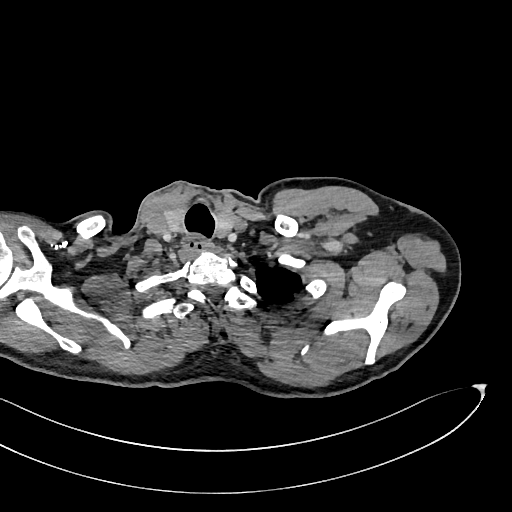
[im 599/629  lung]
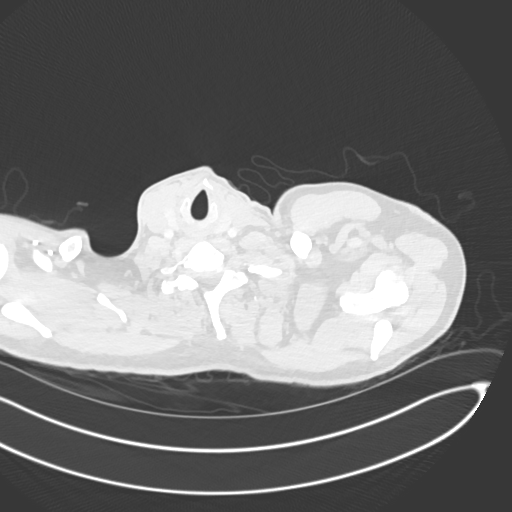

[Series 7: pe 2mm cor · coronal · 0.57mm/px · 1 of 148 slices shown]
[im 74/148  mediastinal]
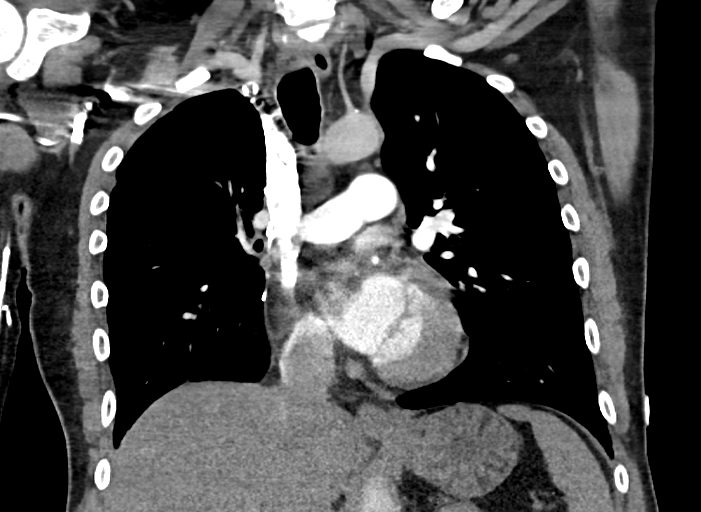

[18 of 36 positions shown; findings below may reference images not displayed]

FINDINGS: The left lung is well aerated and demonstrates emphysematous
changes. No focal infiltrate or sizable effusion is seen. On the
right, emphysematous changes are noted. Postsurgical changes are
noted consistent with prior right upper lobe lobectomy. The
hydropneumothorax has resolved with the space now filled with fluid
along the apex the scarring seen in the right lower lobe has
improved from the prior exam.

The thoracic aorta and pulmonary artery are well visualized and
within normal limits. No evidence of pulmonary emboli are seen.
Scattered small mediastinal nodes are noted. They are not
significantly change from the prior exam.

The visualized upper abdomen is within normal limits. No acute bony
abnormality is seen.

Review of the MIP images confirms the above findings.
IMPRESSION: Postoperative changes consistent with the clinical history.

Resolving scarring in the right lower lobe as well as resolution of
the previously seen right hydropneumothorax. The previous
pneumothorax space is now occupied by a small amount of fluid.

No evidence of pulmonary embolism.

No other focal abnormality is noted.

## 2017-02-22 ENCOUNTER — Encounter: Payer: Self-pay | Admitting: Internal Medicine

## 2017-02-24 DIAGNOSIS — Q381 Ankyloglossia: Secondary | ICD-10-CM | POA: Diagnosis not present

## 2017-02-25 DIAGNOSIS — Q381 Ankyloglossia: Secondary | ICD-10-CM | POA: Diagnosis not present

## 2017-02-26 DIAGNOSIS — Q381 Ankyloglossia: Secondary | ICD-10-CM | POA: Diagnosis not present

## 2017-02-27 DIAGNOSIS — Q381 Ankyloglossia: Secondary | ICD-10-CM | POA: Diagnosis not present

## 2017-02-28 DIAGNOSIS — Q381 Ankyloglossia: Secondary | ICD-10-CM | POA: Diagnosis not present

## 2017-03-01 DIAGNOSIS — Q381 Ankyloglossia: Secondary | ICD-10-CM | POA: Diagnosis not present

## 2017-03-02 DIAGNOSIS — Q381 Ankyloglossia: Secondary | ICD-10-CM | POA: Diagnosis not present

## 2017-03-03 ENCOUNTER — Other Ambulatory Visit: Payer: Self-pay | Admitting: Internal Medicine

## 2017-03-03 DIAGNOSIS — K279 Peptic ulcer, site unspecified, unspecified as acute or chronic, without hemorrhage or perforation: Secondary | ICD-10-CM

## 2017-03-03 DIAGNOSIS — Q381 Ankyloglossia: Secondary | ICD-10-CM | POA: Diagnosis not present

## 2017-03-03 DIAGNOSIS — J301 Allergic rhinitis due to pollen: Secondary | ICD-10-CM

## 2017-03-04 DIAGNOSIS — Q381 Ankyloglossia: Secondary | ICD-10-CM | POA: Diagnosis not present

## 2017-03-05 DIAGNOSIS — Q381 Ankyloglossia: Secondary | ICD-10-CM | POA: Diagnosis not present

## 2017-03-06 DIAGNOSIS — Q381 Ankyloglossia: Secondary | ICD-10-CM | POA: Diagnosis not present

## 2017-03-07 DIAGNOSIS — Q381 Ankyloglossia: Secondary | ICD-10-CM | POA: Diagnosis not present

## 2017-03-08 DIAGNOSIS — Q381 Ankyloglossia: Secondary | ICD-10-CM | POA: Diagnosis not present

## 2017-03-09 ENCOUNTER — Other Ambulatory Visit: Payer: Self-pay | Admitting: Internal Medicine

## 2017-03-09 DIAGNOSIS — Q381 Ankyloglossia: Secondary | ICD-10-CM | POA: Diagnosis not present

## 2017-03-09 DIAGNOSIS — G4731 Primary central sleep apnea: Secondary | ICD-10-CM

## 2017-03-10 DIAGNOSIS — Q381 Ankyloglossia: Secondary | ICD-10-CM | POA: Diagnosis not present

## 2017-03-11 DIAGNOSIS — Q381 Ankyloglossia: Secondary | ICD-10-CM | POA: Diagnosis not present

## 2017-03-12 DIAGNOSIS — Q381 Ankyloglossia: Secondary | ICD-10-CM | POA: Diagnosis not present

## 2017-03-13 DIAGNOSIS — Q381 Ankyloglossia: Secondary | ICD-10-CM | POA: Diagnosis not present

## 2017-03-14 ENCOUNTER — Other Ambulatory Visit: Payer: Self-pay | Admitting: Internal Medicine

## 2017-03-14 DIAGNOSIS — Z7251 High risk heterosexual behavior: Secondary | ICD-10-CM

## 2017-03-14 DIAGNOSIS — Q381 Ankyloglossia: Secondary | ICD-10-CM | POA: Diagnosis not present

## 2017-03-15 DIAGNOSIS — Q381 Ankyloglossia: Secondary | ICD-10-CM | POA: Diagnosis not present

## 2017-03-16 DIAGNOSIS — Q381 Ankyloglossia: Secondary | ICD-10-CM | POA: Diagnosis not present

## 2017-03-17 DIAGNOSIS — Q381 Ankyloglossia: Secondary | ICD-10-CM | POA: Diagnosis not present

## 2017-03-18 DIAGNOSIS — Q381 Ankyloglossia: Secondary | ICD-10-CM | POA: Diagnosis not present

## 2017-03-19 DIAGNOSIS — Q381 Ankyloglossia: Secondary | ICD-10-CM | POA: Diagnosis not present

## 2017-03-19 IMAGING — CR DG CHEST 2V
2 series · 2 of 2 positions shown · non-contrast
Comparison: 12/02/2015

CLINICAL DATA: Right lung cancer, status post right lobectomy and
VATS 09/04/2015.

EXAM:
CHEST  2 VIEW

[w chest pa]
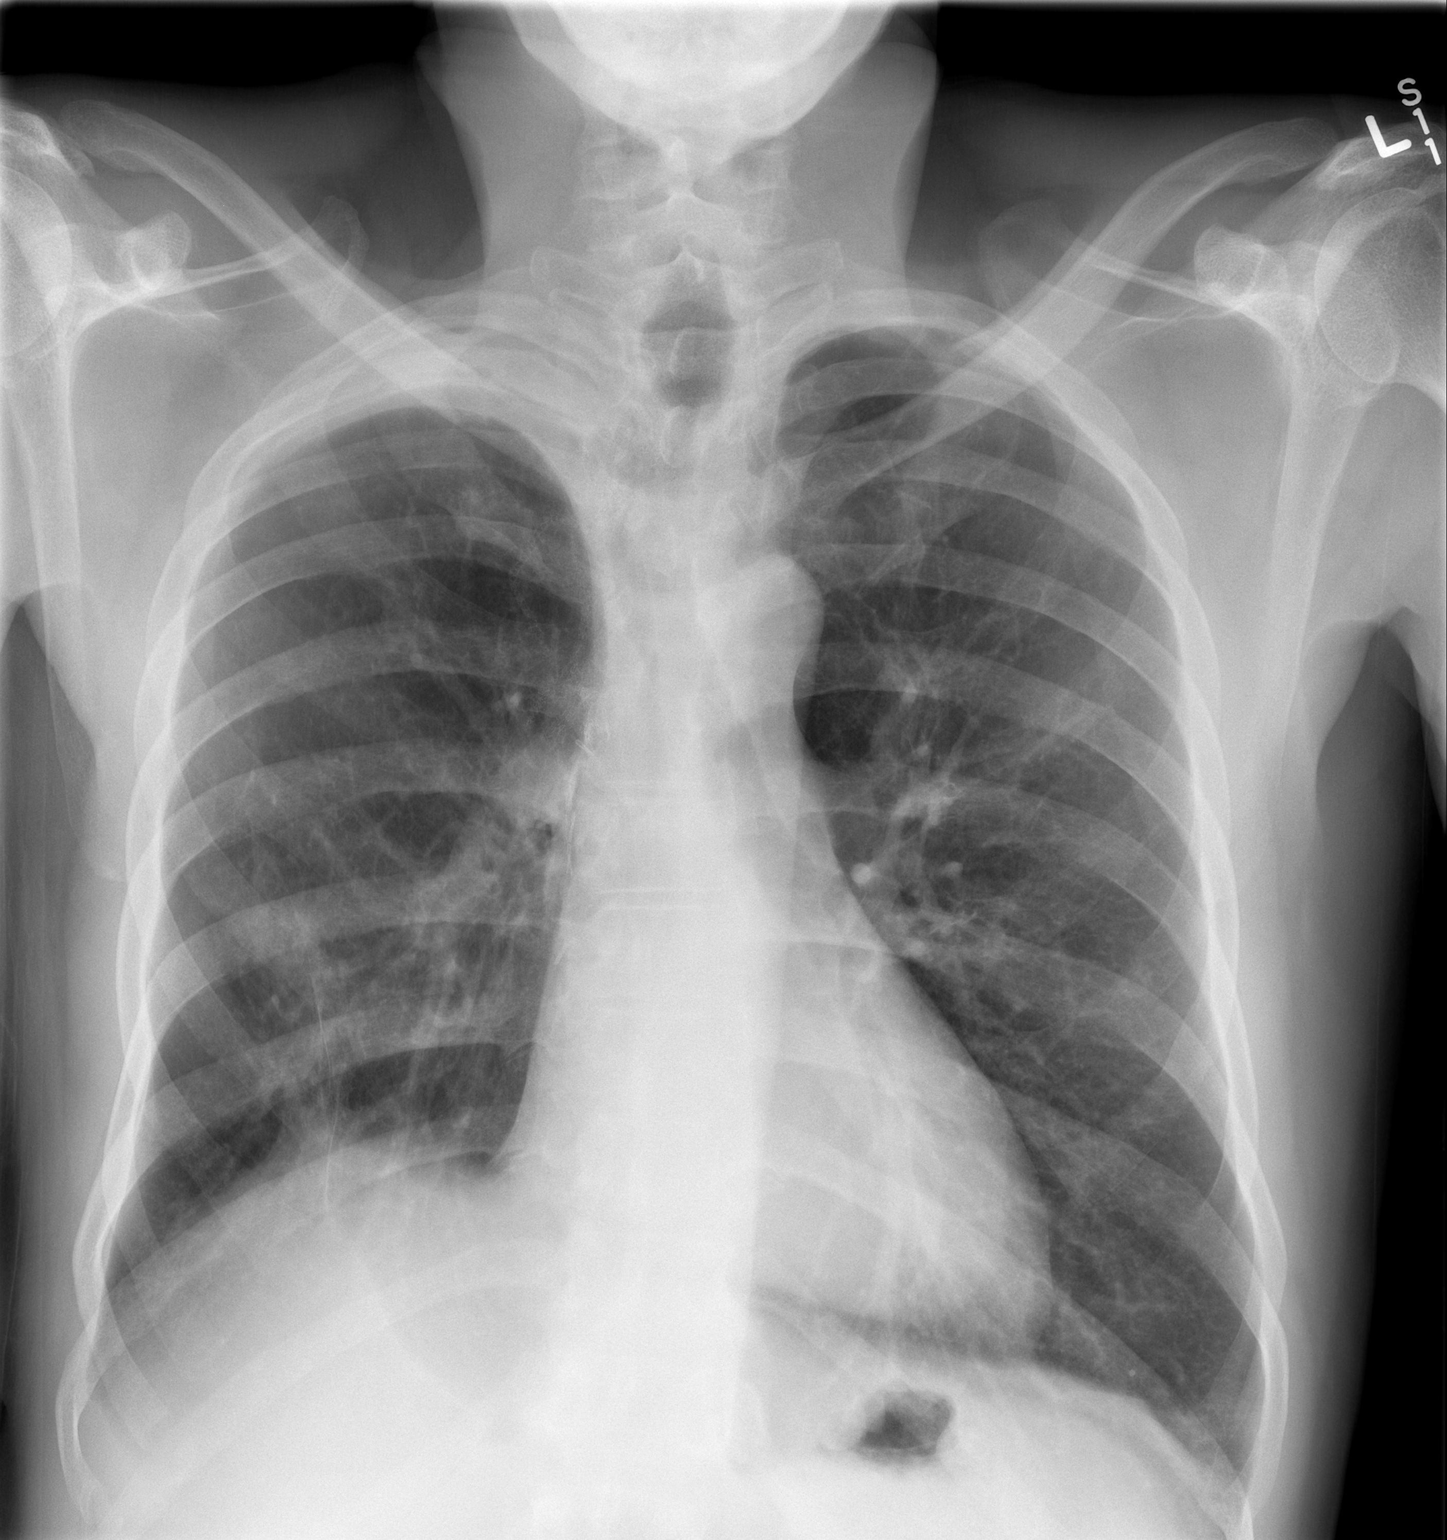

[w chest lat]
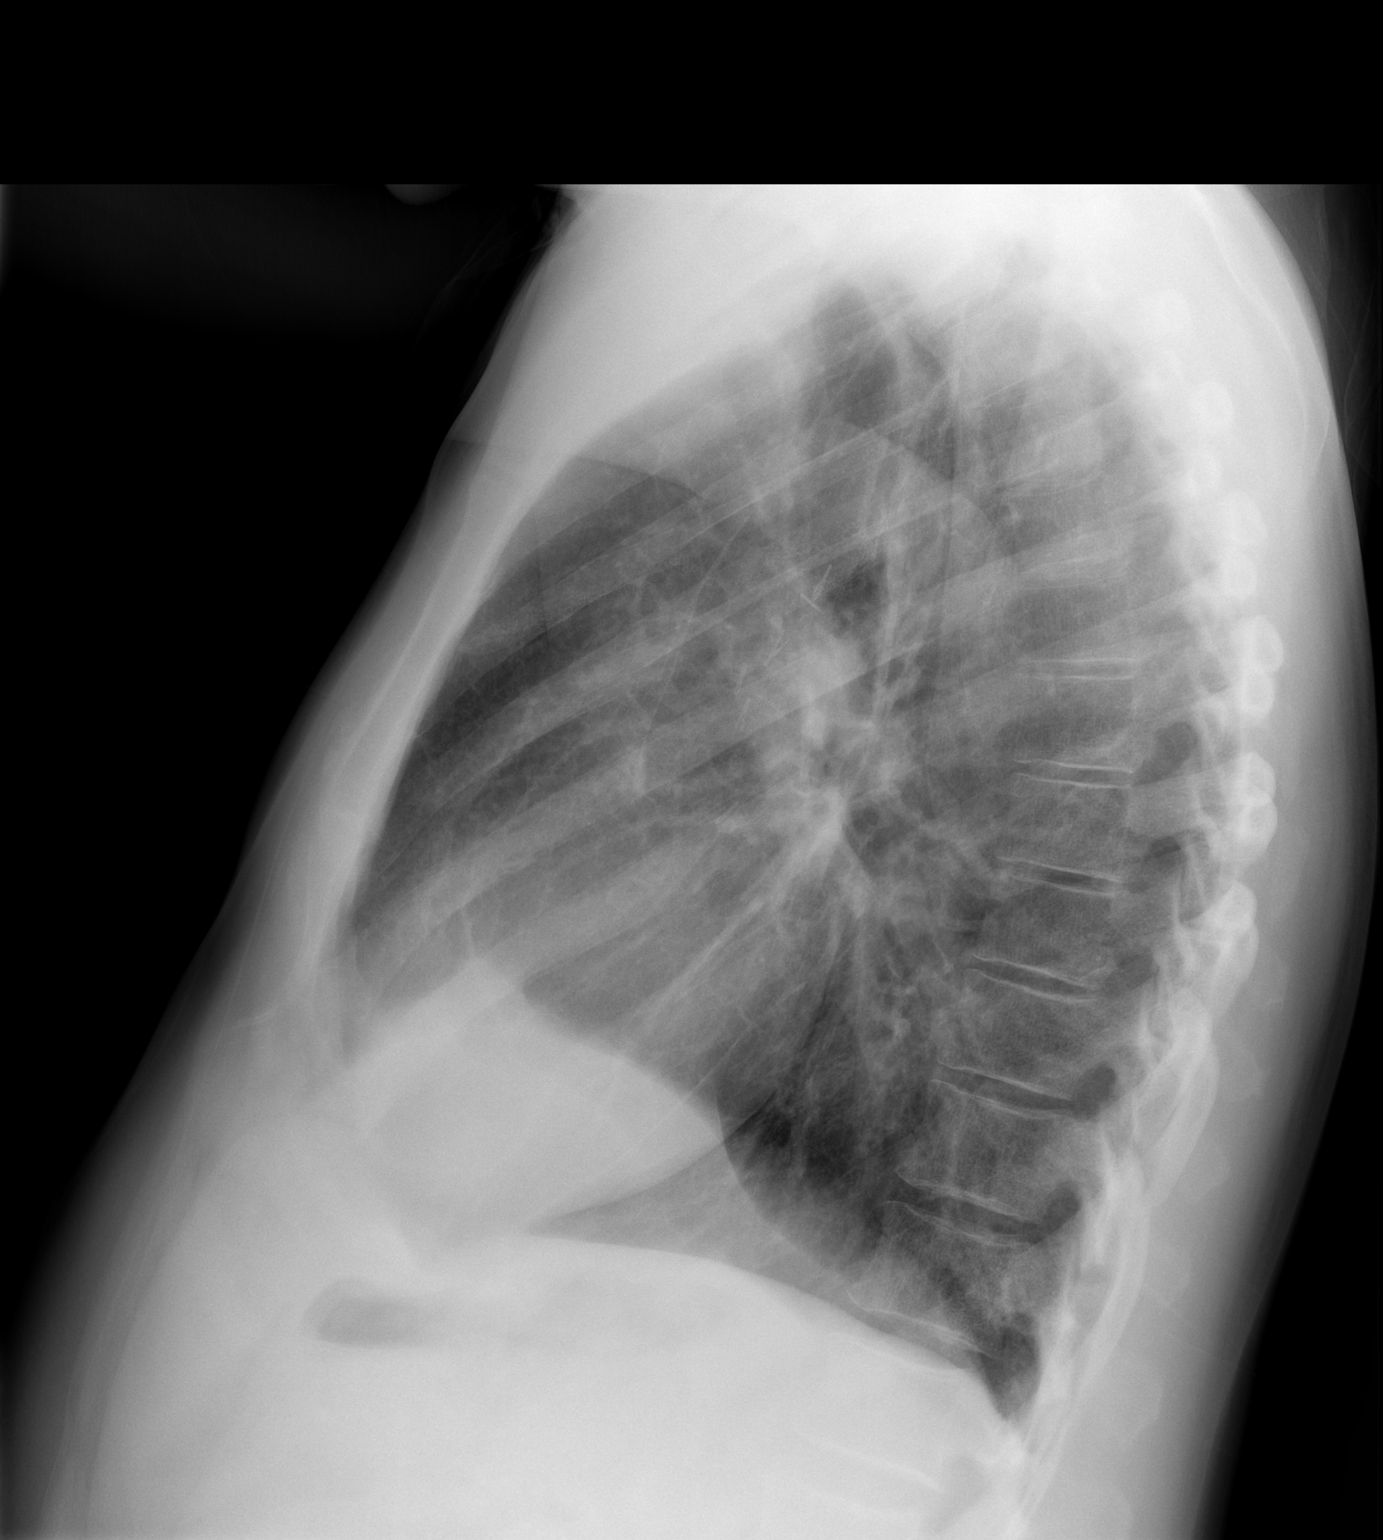

[2 of 2 positions shown; findings below may reference images not displayed]

FINDINGS: Postoperative changes on the right. Stable right apical pleural
thickening and scarring at the right lung base. Left lung is clear.
Heart is normal size. No effusions or acute bony abnormality.
IMPRESSION: Stable postoperative changes on the right.  No active disease.

## 2017-03-20 DIAGNOSIS — Q381 Ankyloglossia: Secondary | ICD-10-CM | POA: Diagnosis not present

## 2017-03-21 DIAGNOSIS — Q381 Ankyloglossia: Secondary | ICD-10-CM | POA: Diagnosis not present

## 2017-03-22 DIAGNOSIS — Q381 Ankyloglossia: Secondary | ICD-10-CM | POA: Diagnosis not present

## 2017-03-23 DIAGNOSIS — Q381 Ankyloglossia: Secondary | ICD-10-CM | POA: Diagnosis not present

## 2017-03-24 DIAGNOSIS — Q381 Ankyloglossia: Secondary | ICD-10-CM | POA: Diagnosis not present

## 2017-03-25 DIAGNOSIS — Q381 Ankyloglossia: Secondary | ICD-10-CM | POA: Diagnosis not present

## 2017-03-26 DIAGNOSIS — Q381 Ankyloglossia: Secondary | ICD-10-CM | POA: Diagnosis not present

## 2017-03-27 DIAGNOSIS — Q381 Ankyloglossia: Secondary | ICD-10-CM | POA: Diagnosis not present

## 2017-03-28 DIAGNOSIS — Q381 Ankyloglossia: Secondary | ICD-10-CM | POA: Diagnosis not present

## 2017-03-29 DIAGNOSIS — Q381 Ankyloglossia: Secondary | ICD-10-CM | POA: Diagnosis not present

## 2017-03-30 DIAGNOSIS — Q381 Ankyloglossia: Secondary | ICD-10-CM | POA: Diagnosis not present

## 2017-03-31 DIAGNOSIS — Q381 Ankyloglossia: Secondary | ICD-10-CM | POA: Diagnosis not present

## 2017-04-01 DIAGNOSIS — Q381 Ankyloglossia: Secondary | ICD-10-CM | POA: Diagnosis not present

## 2017-04-02 DIAGNOSIS — Q381 Ankyloglossia: Secondary | ICD-10-CM | POA: Diagnosis not present

## 2017-04-03 DIAGNOSIS — Q381 Ankyloglossia: Secondary | ICD-10-CM | POA: Diagnosis not present

## 2017-04-04 DIAGNOSIS — Q381 Ankyloglossia: Secondary | ICD-10-CM | POA: Diagnosis not present

## 2017-04-05 DIAGNOSIS — Q381 Ankyloglossia: Secondary | ICD-10-CM | POA: Diagnosis not present

## 2017-04-06 DIAGNOSIS — Q381 Ankyloglossia: Secondary | ICD-10-CM | POA: Diagnosis not present

## 2017-04-07 DIAGNOSIS — Q381 Ankyloglossia: Secondary | ICD-10-CM | POA: Diagnosis not present

## 2017-04-08 DIAGNOSIS — Q381 Ankyloglossia: Secondary | ICD-10-CM | POA: Diagnosis not present
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
## 2017-04-09 DIAGNOSIS — Q381 Ankyloglossia: Secondary | ICD-10-CM | POA: Diagnosis not present

## 2017-04-10 DIAGNOSIS — Q381 Ankyloglossia: Secondary | ICD-10-CM | POA: Diagnosis not present

## 2017-04-11 DIAGNOSIS — Q381 Ankyloglossia: Secondary | ICD-10-CM | POA: Diagnosis not present

## 2017-04-12 DIAGNOSIS — Q381 Ankyloglossia: Secondary | ICD-10-CM | POA: Diagnosis not present

## 2017-04-13 DIAGNOSIS — Q381 Ankyloglossia: Secondary | ICD-10-CM | POA: Diagnosis not present

## 2017-04-14 DIAGNOSIS — Q381 Ankyloglossia: Secondary | ICD-10-CM | POA: Diagnosis not present

## 2017-04-15 DIAGNOSIS — Q381 Ankyloglossia: Secondary | ICD-10-CM | POA: Diagnosis not present

## 2017-04-16 DIAGNOSIS — Q381 Ankyloglossia: Secondary | ICD-10-CM | POA: Diagnosis not present

## 2017-04-17 ENCOUNTER — Other Ambulatory Visit: Payer: Self-pay

## 2017-04-17 DIAGNOSIS — Q381 Ankyloglossia: Secondary | ICD-10-CM | POA: Diagnosis not present

## 2017-04-18 DIAGNOSIS — Q381 Ankyloglossia: Secondary | ICD-10-CM | POA: Diagnosis not present

## 2017-04-19 ENCOUNTER — Telehealth: Payer: Self-pay | Admitting: Internal Medicine

## 2017-04-19 ENCOUNTER — Telehealth: Payer: Self-pay | Admitting: Medical Oncology

## 2017-04-19 ENCOUNTER — Other Ambulatory Visit: Payer: Self-pay

## 2017-04-19 ENCOUNTER — Other Ambulatory Visit: Payer: Self-pay | Admitting: Medical Oncology

## 2017-04-19 DIAGNOSIS — Q381 Ankyloglossia: Secondary | ICD-10-CM | POA: Diagnosis not present

## 2017-04-19 NOTE — Telephone Encounter (Signed)
CONFIRMED LAB APPT 7/26 AT 1330

## 2017-04-19 NOTE — Telephone Encounter (Signed)
I left message to return call because he  has lab appt today and if he cannot make it to r/s before scan on 7/27. I also gave him appt for [facility] on July 30th.

## 2017-04-20 ENCOUNTER — Ambulatory Visit (HOSPITAL_COMMUNITY)
Admission: RE | Admit: 2017-04-20 | Discharge: 2017-04-20 | Disposition: A | Discharge status: Home / Self Care | Payer: Medicare HMO | Source: Ambulatory Visit | Attending: Internal Medicine | Admitting: Internal Medicine

## 2017-04-20 ENCOUNTER — Other Ambulatory Visit (HOSPITAL_BASED_OUTPATIENT_CLINIC_OR_DEPARTMENT_OTHER): Payer: Medicare HMO

## 2017-04-20 DIAGNOSIS — J984 Other disorders of lung: Secondary | ICD-10-CM | POA: Insufficient documentation

## 2017-04-20 DIAGNOSIS — F17210 Nicotine dependence, cigarettes, uncomplicated: Secondary | ICD-10-CM | POA: Diagnosis not present

## 2017-04-20 DIAGNOSIS — C3490 Malignant neoplasm of unspecified part of unspecified bronchus or lung: Secondary | ICD-10-CM | POA: Diagnosis not present

## 2017-04-20 DIAGNOSIS — J439 Emphysema, unspecified: Secondary | ICD-10-CM | POA: Insufficient documentation

## 2017-04-20 DIAGNOSIS — Z85118 Personal history of other malignant neoplasm of bronchus and lung: Secondary | ICD-10-CM | POA: Diagnosis not present

## 2017-04-20 DIAGNOSIS — C3491 Malignant neoplasm of unspecified part of right bronchus or lung: Secondary | ICD-10-CM

## 2017-04-20 DIAGNOSIS — F200 Paranoid schizophrenia: Secondary | ICD-10-CM

## 2017-04-20 DIAGNOSIS — Q381 Ankyloglossia: Secondary | ICD-10-CM | POA: Diagnosis not present

## 2017-04-20 DIAGNOSIS — I700 Atherosclerosis of aorta: Secondary | ICD-10-CM | POA: Diagnosis not present

## 2017-04-20 LAB — CBC WITH DIFFERENTIAL/PLATELET
BASO%: 0.6 % (ref 0.0–2.0)
Basophils Absolute: 0 10*3/uL (ref 0.0–0.1)
EOS ABS: 0 10*3/uL (ref 0.0–0.5)
EOS%: 0.8 % (ref 0.0–7.0)
HCT: 47.6 % (ref 38.4–49.9)
HEMOGLOBIN: 15.7 g/dL (ref 13.0–17.1)
LYMPH%: 28.3 % (ref 14.0–49.0)
MCH: 29.2 pg (ref 27.2–33.4)
MCHC: 33 g/dL (ref 32.0–36.0)
MCV: 88.5 fL (ref 79.3–98.0)
MONO#: 0.7 10*3/uL (ref 0.1–0.9)
MONO%: 11.1 % (ref 0.0–14.0)
NEUT%: 59.2 % (ref 39.0–75.0)
NEUTROS ABS: 3.7 10*3/uL (ref 1.5–6.5)
Platelets: 113 10*3/uL — ABNORMAL LOW (ref 140–400)
RBC: 5.38 10*6/uL (ref 4.20–5.82)
RDW: 18 % — AB (ref 11.0–14.6)
WBC: 6.2 10*3/uL (ref 4.0–10.3)
lymph#: 1.8 10*3/uL (ref 0.9–3.3)

## 2017-04-20 MED ORDER — IOPAMIDOL (ISOVUE-300) INJECTION 61%
INTRAVENOUS | Status: AC
  Filled 2017-04-20: qty 75

## 2017-04-20 MED ORDER — IOPAMIDOL (ISOVUE-300) INJECTION 61%
75.0000 mL | Freq: Once | INTRAVENOUS | Status: AC | PRN
  Administered 2017-04-20: 75 mL via INTRAVENOUS

## 2017-04-21 DIAGNOSIS — Q381 Ankyloglossia: Secondary | ICD-10-CM | POA: Diagnosis not present

## 2017-04-22 DIAGNOSIS — Q381 Ankyloglossia: Secondary | ICD-10-CM | POA: Diagnosis not present

## 2017-04-23 DIAGNOSIS — Q381 Ankyloglossia: Secondary | ICD-10-CM | POA: Diagnosis not present

## 2017-04-24 ENCOUNTER — Ambulatory Visit: Payer: Self-pay | Admitting: Internal Medicine

## 2017-04-24 DIAGNOSIS — Q381 Ankyloglossia: Secondary | ICD-10-CM | POA: Diagnosis not present

## 2017-04-25 DIAGNOSIS — Q381 Ankyloglossia: Secondary | ICD-10-CM | POA: Diagnosis not present

## 2017-04-26 ENCOUNTER — Telehealth: Payer: Self-pay | Admitting: Internal Medicine

## 2017-04-26 DIAGNOSIS — Q381 Ankyloglossia: Secondary | ICD-10-CM | POA: Diagnosis not present

## 2017-04-26 NOTE — Telephone Encounter (Signed)
Called patient to reschedule appt. Left a message.   [facility] 04/26/2017.

## 2017-04-27 ENCOUNTER — Telehealth: Payer: Self-pay | Admitting: Internal Medicine

## 2017-04-27 ENCOUNTER — Other Ambulatory Visit: Payer: Self-pay | Admitting: Internal Medicine

## 2017-04-27 DIAGNOSIS — Q381 Ankyloglossia: Secondary | ICD-10-CM | POA: Diagnosis not present

## 2017-04-27 DIAGNOSIS — F200 Paranoid schizophrenia: Secondary | ICD-10-CM

## 2017-04-27 NOTE — Telephone Encounter (Signed)
Scheduled appt per sch message - left message with new appt date and time and sent reminder letter in the mail.

## 2017-04-28 DIAGNOSIS — Q381 Ankyloglossia: Secondary | ICD-10-CM | POA: Diagnosis not present

## 2017-04-29 DIAGNOSIS — Q381 Ankyloglossia: Secondary | ICD-10-CM | POA: Diagnosis not present

## 2017-04-30 DIAGNOSIS — Q381 Ankyloglossia: Secondary | ICD-10-CM | POA: Diagnosis not present

## 2017-05-01 DIAGNOSIS — Q381 Ankyloglossia: Secondary | ICD-10-CM | POA: Diagnosis not present

## 2017-05-02 DIAGNOSIS — Q381 Ankyloglossia: Secondary | ICD-10-CM | POA: Diagnosis not present

## 2017-05-03 DIAGNOSIS — Q381 Ankyloglossia: Secondary | ICD-10-CM | POA: Diagnosis not present

## 2017-05-04 DIAGNOSIS — Q381 Ankyloglossia: Secondary | ICD-10-CM | POA: Diagnosis not present

## 2017-05-05 DIAGNOSIS — Q381 Ankyloglossia: Secondary | ICD-10-CM | POA: Diagnosis not present

## 2017-05-06 DIAGNOSIS — Q381 Ankyloglossia: Secondary | ICD-10-CM | POA: Diagnosis not present

## 2017-05-07 DIAGNOSIS — Q381 Ankyloglossia: Secondary | ICD-10-CM | POA: Diagnosis not present

## 2017-05-08 DIAGNOSIS — Q381 Ankyloglossia: Secondary | ICD-10-CM | POA: Diagnosis not present

## 2017-05-09 DIAGNOSIS — Q381 Ankyloglossia: Secondary | ICD-10-CM | POA: Diagnosis not present

## 2017-05-10 DIAGNOSIS — Q381 Ankyloglossia: Secondary | ICD-10-CM | POA: Diagnosis not present

## 2017-05-11 DIAGNOSIS — Q381 Ankyloglossia: Secondary | ICD-10-CM | POA: Diagnosis not present

## 2017-05-12 DIAGNOSIS — Q381 Ankyloglossia: Secondary | ICD-10-CM | POA: Diagnosis not present

## 2017-05-13 DIAGNOSIS — Q381 Ankyloglossia: Secondary | ICD-10-CM | POA: Diagnosis not present

## 2017-05-14 DIAGNOSIS — Q381 Ankyloglossia: Secondary | ICD-10-CM | POA: Diagnosis not present

## 2017-05-15 DIAGNOSIS — Q381 Ankyloglossia: Secondary | ICD-10-CM | POA: Diagnosis not present

## 2017-05-16 ENCOUNTER — Ambulatory Visit: Payer: Self-pay | Admitting: Internal Medicine

## 2017-05-16 ENCOUNTER — Other Ambulatory Visit: Payer: Self-pay | Admitting: Internal Medicine

## 2017-05-16 ENCOUNTER — Telehealth: Payer: Self-pay

## 2017-05-16 DIAGNOSIS — E785 Hyperlipidemia, unspecified: Secondary | ICD-10-CM

## 2017-05-16 DIAGNOSIS — I739 Peripheral vascular disease, unspecified: Secondary | ICD-10-CM

## 2017-05-16 DIAGNOSIS — Q381 Ankyloglossia: Secondary | ICD-10-CM | POA: Diagnosis not present

## 2017-05-16 MED ORDER — PRAVASTATIN SODIUM 40 MG PO TABS: 40.0000 mg | ORAL_TABLET | Freq: Every day | ORAL | 1 refills | Status: DC

## 2017-05-16 NOTE — Telephone Encounter (Signed)
Shirron start PA on 05/15/2017. Key: [id number]   PA was denied. Is there an alternative?

## 2017-05-16 NOTE — Telephone Encounter (Signed)
changed

## 2017-05-17 DIAGNOSIS — Q381 Ankyloglossia: Secondary | ICD-10-CM | POA: Diagnosis not present

## 2017-05-17 NOTE — Telephone Encounter (Signed)
Pt informed of change

## 2017-05-18 DIAGNOSIS — Q381 Ankyloglossia: Secondary | ICD-10-CM | POA: Diagnosis not present

## 2017-05-19 DIAGNOSIS — Q381 Ankyloglossia: Secondary | ICD-10-CM | POA: Diagnosis not present

## 2017-05-20 DIAGNOSIS — Q381 Ankyloglossia: Secondary | ICD-10-CM | POA: Diagnosis not present

## 2017-05-21 DIAGNOSIS — Q381 Ankyloglossia: Secondary | ICD-10-CM | POA: Diagnosis not present

## 2017-05-22 DIAGNOSIS — Q381 Ankyloglossia: Secondary | ICD-10-CM | POA: Diagnosis not present

## 2017-05-23 DIAGNOSIS — Q381 Ankyloglossia: Secondary | ICD-10-CM | POA: Diagnosis not present

## 2017-05-24 ENCOUNTER — Encounter: Payer: Self-pay | Admitting: *Deleted

## 2017-05-24 DIAGNOSIS — Q381 Ankyloglossia: Secondary | ICD-10-CM | POA: Diagnosis not present

## 2017-05-24 NOTE — Progress Notes (Signed)
Oncology Nurse Navigator Documentation  Oncology Nurse Navigator Flowsheets 05/24/2017  Navigator Location [facility]  Navigator Encounter Type [name] was a no show recently with Dr. Julien Nordmann. I spoke with Dr. Julien Nordmann and he would like to see him next week. If no availability, he requested [name] to see patient. I notified scheduling to call patient with an appt next week. I also noted if patient needs to see [name] to schedule when Dr. Julien Nordmann is in the office.   Treatment Phase Follow-up  Barriers/Navigation Needs Coordination of Care  Interventions Coordination of Care  Coordination of Care Other  Acuity Level 2  Time Spent with Patient 30

## 2017-05-25 ENCOUNTER — Ambulatory Visit: Payer: Self-pay | Admitting: Internal Medicine

## 2017-05-25 ENCOUNTER — Telehealth: Payer: Self-pay | Admitting: Internal Medicine

## 2017-05-25 DIAGNOSIS — Q381 Ankyloglossia: Secondary | ICD-10-CM | POA: Diagnosis not present

## 2017-05-25 NOTE — Telephone Encounter (Signed)
Scheduled appt per 8/29 sch message - left message with appt date and time. And sent reminder letter in the mail.

## 2017-05-26 DIAGNOSIS — Q381 Ankyloglossia: Secondary | ICD-10-CM | POA: Diagnosis not present

## 2017-05-27 DIAGNOSIS — Q381 Ankyloglossia: Secondary | ICD-10-CM | POA: Diagnosis not present

## 2017-05-28 DIAGNOSIS — Q381 Ankyloglossia: Secondary | ICD-10-CM | POA: Diagnosis not present

## 2017-05-29 DIAGNOSIS — Q381 Ankyloglossia: Secondary | ICD-10-CM | POA: Diagnosis not present

## 2017-05-30 ENCOUNTER — Other Ambulatory Visit: Payer: Self-pay | Admitting: Internal Medicine

## 2017-05-30 ENCOUNTER — Encounter: Payer: Self-pay | Admitting: Internal Medicine

## 2017-05-30 ENCOUNTER — Ambulatory Visit (HOSPITAL_BASED_OUTPATIENT_CLINIC_OR_DEPARTMENT_OTHER): Payer: Medicare HMO | Admitting: Internal Medicine

## 2017-05-30 VITALS — BP 131/79 | HR 98 | Temp 97.8°F | Resp 18 | Wt 192.8 lb

## 2017-05-30 DIAGNOSIS — Z85118 Personal history of other malignant neoplasm of bronchus and lung: Secondary | ICD-10-CM | POA: Diagnosis not present

## 2017-05-30 DIAGNOSIS — C3491 Malignant neoplasm of unspecified part of right bronchus or lung: Secondary | ICD-10-CM

## 2017-05-30 DIAGNOSIS — J449 Chronic obstructive pulmonary disease, unspecified: Secondary | ICD-10-CM

## 2017-05-30 DIAGNOSIS — G4730 Sleep apnea, unspecified: Secondary | ICD-10-CM

## 2017-05-30 DIAGNOSIS — Q381 Ankyloglossia: Secondary | ICD-10-CM | POA: Diagnosis not present

## 2017-05-30 DIAGNOSIS — G4700 Insomnia, unspecified: Secondary | ICD-10-CM
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
## 2017-05-31 ENCOUNTER — Telehealth: Payer: Self-pay | Admitting: Internal Medicine

## 2017-05-31 DIAGNOSIS — Q381 Ankyloglossia: Secondary | ICD-10-CM | POA: Diagnosis not present

## 2017-05-31 NOTE — Telephone Encounter (Signed)
Scheduled appt per 9/4 los - lab and ct in 6 months and f/u - [facility] to contact patient with schedule. Sent reminder letter in the mail.

## 2017-06-01 DIAGNOSIS — Q381 Ankyloglossia: Secondary | ICD-10-CM | POA: Diagnosis not present

## 2017-06-02 DIAGNOSIS — Q381 Ankyloglossia: Secondary | ICD-10-CM | POA: Diagnosis not present

## 2017-06-03 DIAGNOSIS — Q381 Ankyloglossia: Secondary | ICD-10-CM | POA: Diagnosis not present

## 2017-06-04 DIAGNOSIS — Q381 Ankyloglossia: Secondary | ICD-10-CM | POA: Diagnosis not present

## 2017-06-05 DIAGNOSIS — Q381 Ankyloglossia: Secondary | ICD-10-CM | POA: Diagnosis not present

## 2017-06-06 DIAGNOSIS — Q381 Ankyloglossia: Secondary | ICD-10-CM | POA: Diagnosis not present

## 2017-06-07 DIAGNOSIS — Q381 Ankyloglossia: Secondary | ICD-10-CM | POA: Diagnosis not present

## 2017-06-08 DIAGNOSIS — Q381 Ankyloglossia: Secondary | ICD-10-CM | POA: Diagnosis not present

## 2017-06-09 DIAGNOSIS — Q381 Ankyloglossia: Secondary | ICD-10-CM | POA: Diagnosis not present

## 2017-06-10 DIAGNOSIS — Q381 Ankyloglossia: Secondary | ICD-10-CM | POA: Diagnosis not present

## 2017-06-11 DIAGNOSIS — Q381 Ankyloglossia: Secondary | ICD-10-CM | POA: Diagnosis not present

## 2017-06-12 DIAGNOSIS — Q381 Ankyloglossia: Secondary | ICD-10-CM | POA: Diagnosis not present

## 2017-06-13 DIAGNOSIS — Q381 Ankyloglossia: Secondary | ICD-10-CM | POA: Diagnosis not present

## 2017-06-14 DIAGNOSIS — Q381 Ankyloglossia: Secondary | ICD-10-CM | POA: Diagnosis not present

## 2017-06-15 DIAGNOSIS — Q381 Ankyloglossia: Secondary | ICD-10-CM | POA: Diagnosis not present

## 2017-06-16 ENCOUNTER — Institutional Professional Consult (permissible substitution): Payer: Self-pay | Admitting: Internal Medicine

## 2017-06-16 DIAGNOSIS — Q381 Ankyloglossia: Secondary | ICD-10-CM | POA: Diagnosis not present

## 2017-06-17 DIAGNOSIS — Q381 Ankyloglossia: Secondary | ICD-10-CM | POA: Diagnosis not present

## 2017-06-18 DIAGNOSIS — Q381 Ankyloglossia: Secondary | ICD-10-CM | POA: Diagnosis not present

## 2017-06-19 DIAGNOSIS — Q381 Ankyloglossia: Secondary | ICD-10-CM | POA: Diagnosis not present

## 2017-06-20 DIAGNOSIS — Q381 Ankyloglossia: Secondary | ICD-10-CM | POA: Diagnosis not present

## 2017-06-21 DIAGNOSIS — Q381 Ankyloglossia: Secondary | ICD-10-CM | POA: Diagnosis not present

## 2017-06-22 DIAGNOSIS — Q381 Ankyloglossia: Secondary | ICD-10-CM | POA: Diagnosis not present

## 2017-06-23 DIAGNOSIS — Q381 Ankyloglossia: Secondary | ICD-10-CM | POA: Diagnosis not present

## 2017-06-24 DIAGNOSIS — Q381 Ankyloglossia: Secondary | ICD-10-CM | POA: Diagnosis not present

## 2017-06-25 DIAGNOSIS — Q381 Ankyloglossia: Secondary | ICD-10-CM | POA: Diagnosis not present

## 2017-06-26 ENCOUNTER — Other Ambulatory Visit: Payer: Self-pay | Admitting: Internal Medicine

## 2017-06-26 DIAGNOSIS — G4730 Sleep apnea, unspecified: Secondary | ICD-10-CM

## 2017-06-26 DIAGNOSIS — J449 Chronic obstructive pulmonary disease, unspecified: Secondary | ICD-10-CM

## 2017-06-26 DIAGNOSIS — Q381 Ankyloglossia: Secondary | ICD-10-CM | POA: Diagnosis not present

## 2017-06-26 DIAGNOSIS — G4700 Insomnia, unspecified: Secondary | ICD-10-CM

## 2017-06-27 DIAGNOSIS — Q381 Ankyloglossia: Secondary | ICD-10-CM | POA: Diagnosis not present

## 2017-06-27 NOTE — Telephone Encounter (Signed)
Can you advise on the Lunesta please, in PCP absence?

## 2017-06-27 NOTE — Telephone Encounter (Signed)
He should have refills left on the lunesta.

## 2017-06-28 DIAGNOSIS — Q381 Ankyloglossia: Secondary | ICD-10-CM | POA: Diagnosis not present

## 2017-06-29 DIAGNOSIS — Q381 Ankyloglossia: Secondary | ICD-10-CM | POA: Diagnosis not present

## 2017-06-30 DIAGNOSIS — Q381 Ankyloglossia: Secondary | ICD-10-CM | POA: Diagnosis not present

## 2017-07-01 DIAGNOSIS — Q381 Ankyloglossia: Secondary | ICD-10-CM | POA: Diagnosis not present

## 2017-07-02 DIAGNOSIS — Q381 Ankyloglossia: Secondary | ICD-10-CM | POA: Diagnosis not present

## 2017-07-03 ENCOUNTER — Other Ambulatory Visit: Payer: Self-pay | Admitting: Internal Medicine

## 2017-07-03 DIAGNOSIS — Q381 Ankyloglossia: Secondary | ICD-10-CM | POA: Diagnosis not present

## 2017-07-03 DIAGNOSIS — F418 Other specified anxiety disorders: Secondary | ICD-10-CM

## 2017-07-03 IMAGING — CT CT CHEST W/ CM
2 of 4 series · 15 of 36 positions shown, 18 images · IV contrast (iopamidol)
Comparison: 01/18/2016 plain film.  Most recent CT 12/02/2015.

CLINICAL DATA: Right-sided lung cancer diagnosed in 0013 with
right-sided lobectomy only. Right upper lobe primary. Restaging.

EXAM:
CT CHEST WITH CONTRAST
TECHNIQUE: Multidetector CT imaging of the chest was performed during
intravenous contrast administration.
CONTRAST:  75mL [id number] IOPAMIDOL ([id number]) INJECTION 61%

[Series 2: chest with st · axial · 0.85mm/px · z∈[+1091,+1365]mm · 12 of 161 slices shown, 15 images]
[im 12/161  mediastinal]
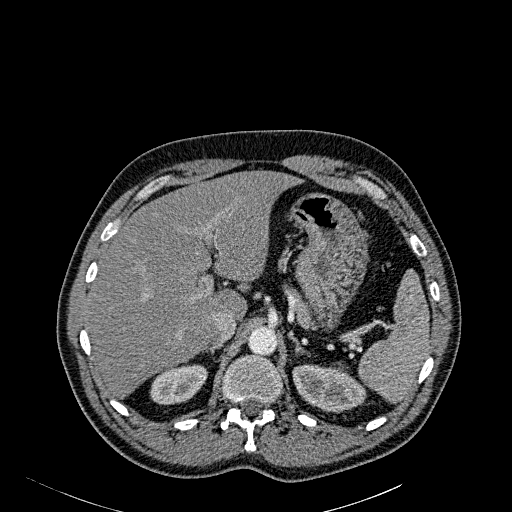
[im 12/161  lung]
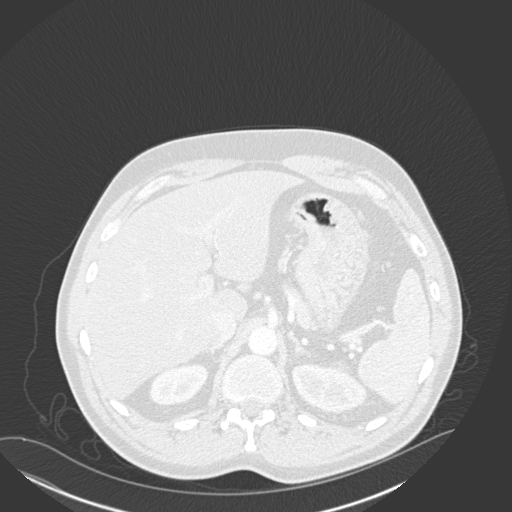
[im 23/161  lung]
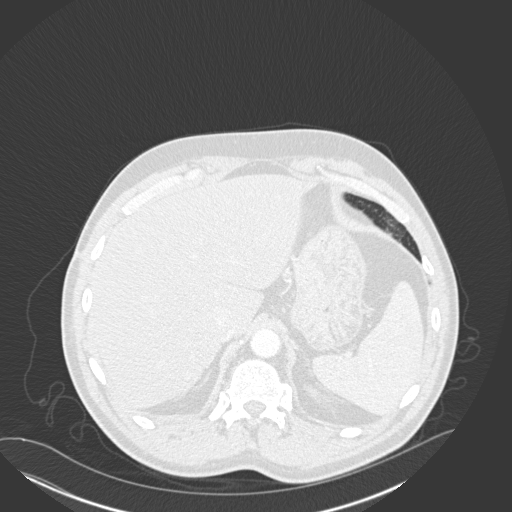
[im 35/161  lung]
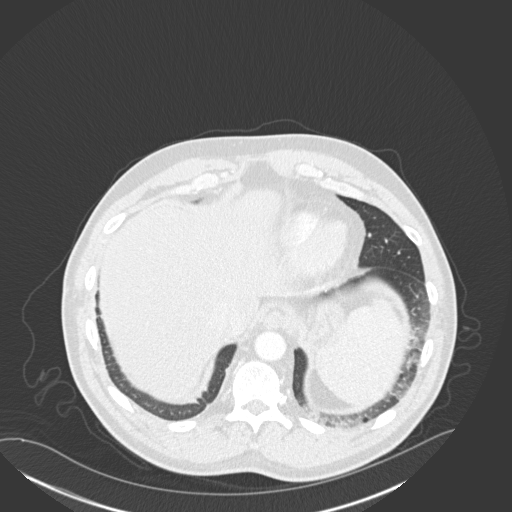
[im 46/161  lung]
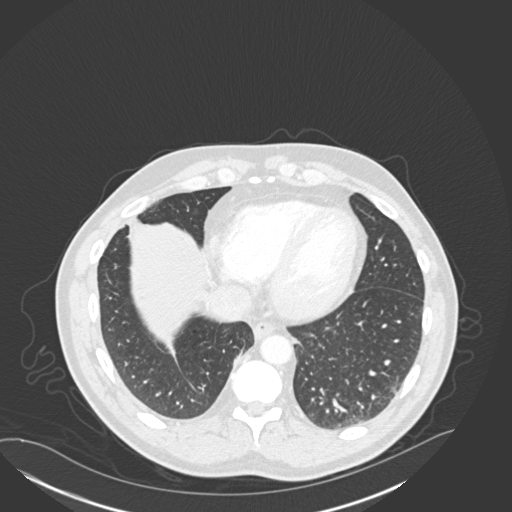
[im 58/161  mediastinal]
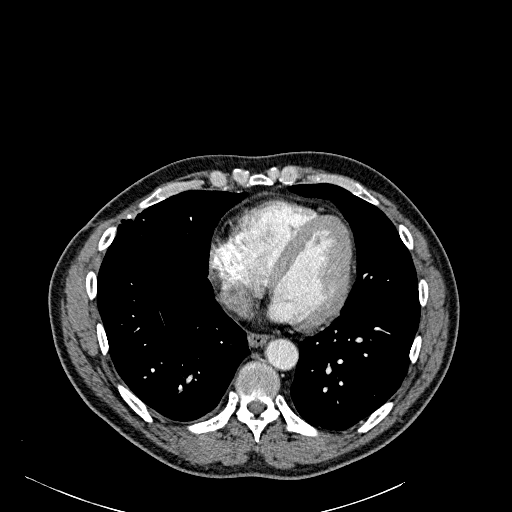
[im 58/161  lung]
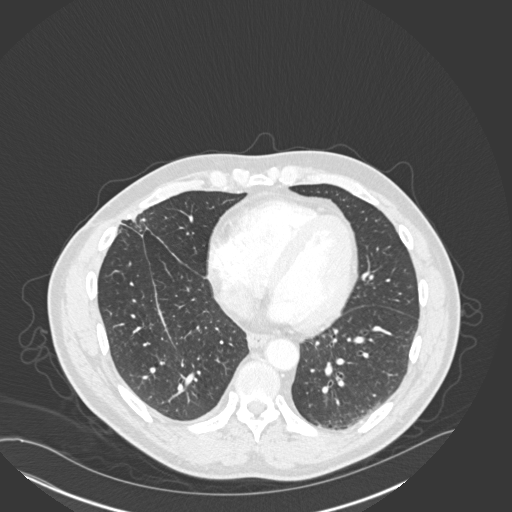
[im 69/161  lung]
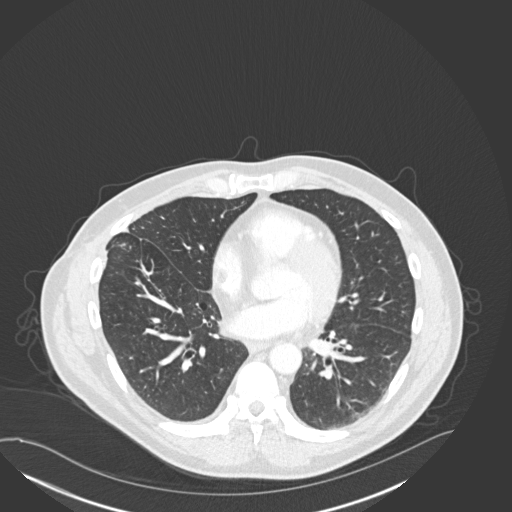
[im 92/161  lung]
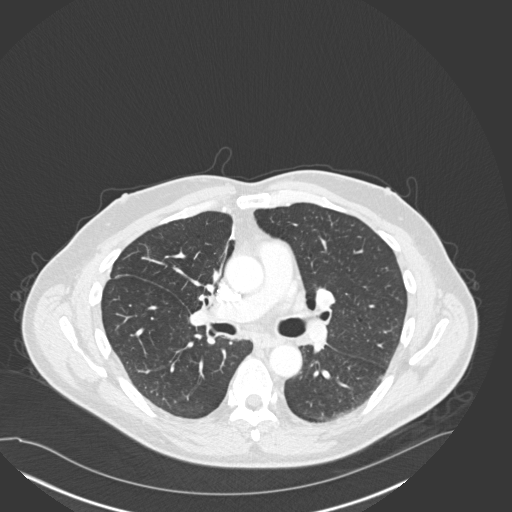
[im 103/161  lung]
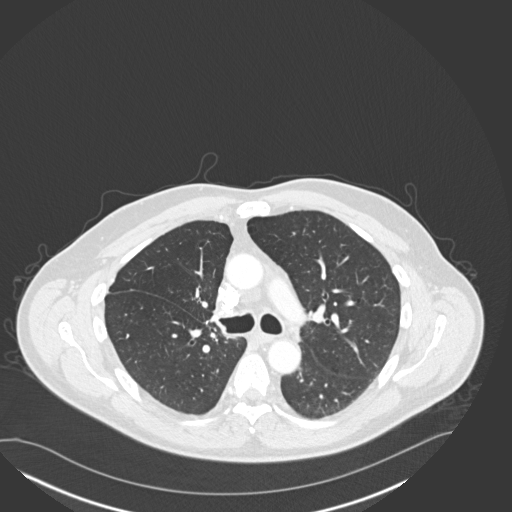
[im 115/161  mediastinal]
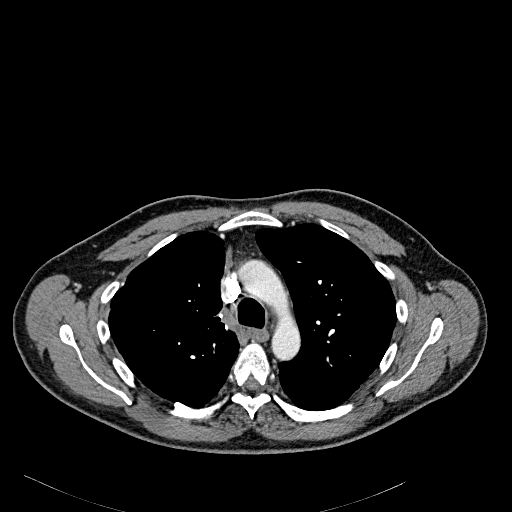
[im 115/161  lung]
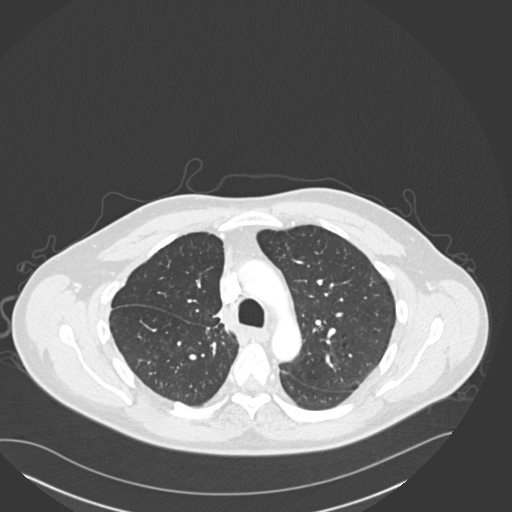
[im 126/161  lung]
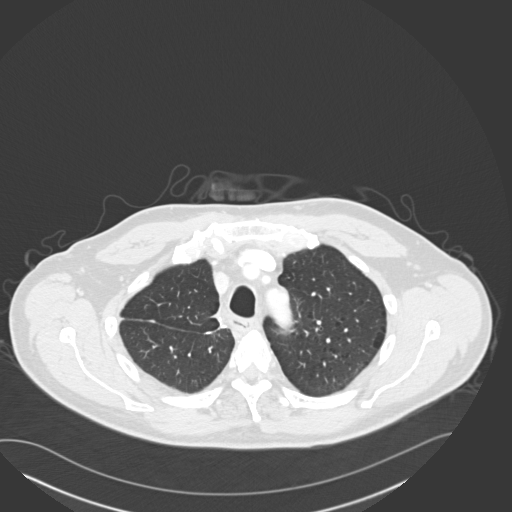
[im 138/161  lung]
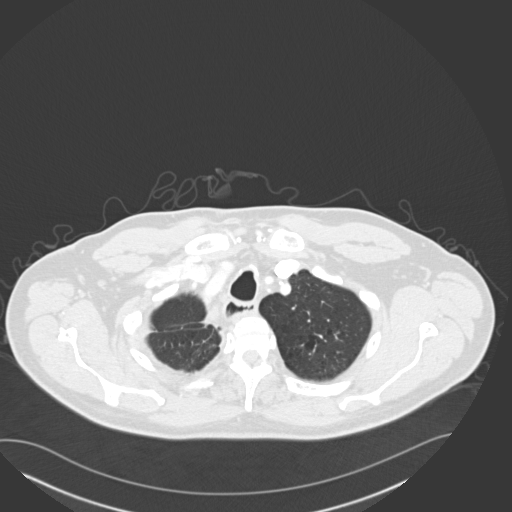
[im 149/161  lung]
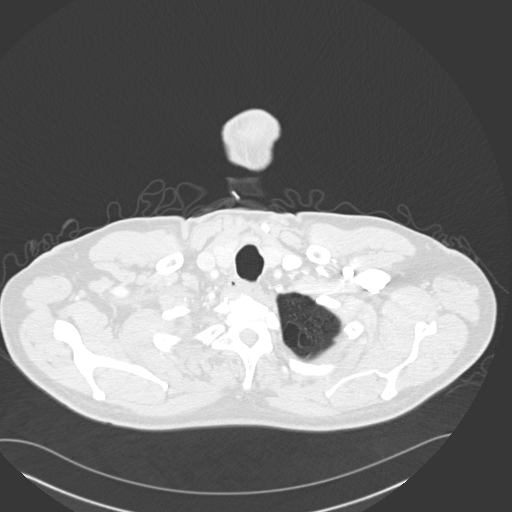

[Series 602: <mpr thick range> · coronal · 0.85mm/px · 3 of 140 slices shown]
[im 28/140  lung]
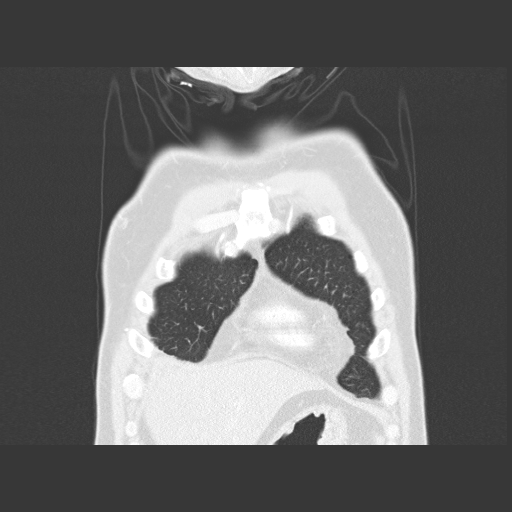
[im 56/140  lung]
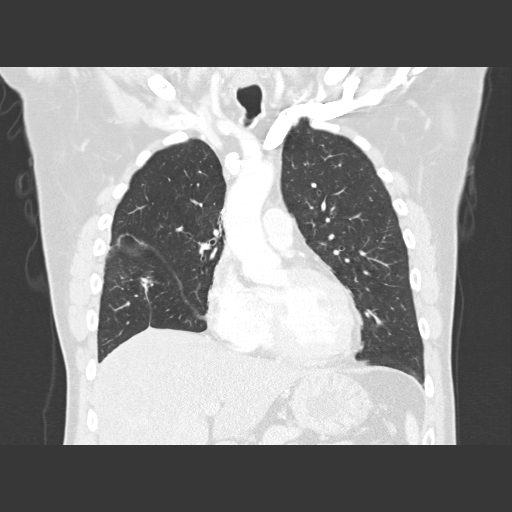
[im 84/140  lung]
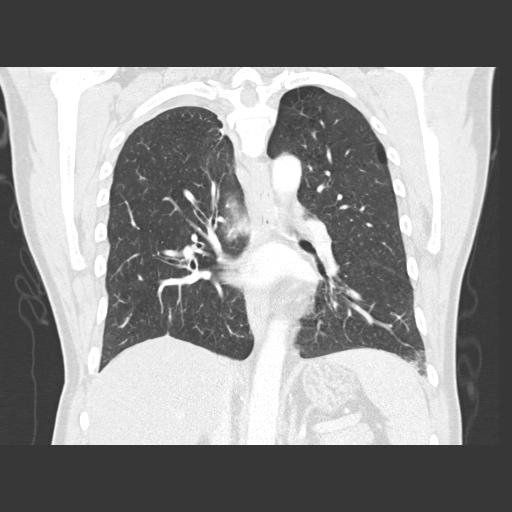

[15 of 36 positions shown; findings below may reference images not displayed]

FINDINGS: Cardiovascular: Normal caliber of the aorta and branch vessels.
Borderline cardiomegaly, without pericardial effusion. LAD and left
circumflex coronary artery atherosclerosis. No central pulmonary
embolism, on this non-dedicated study.

Mediastinum/Nodes: No supraclavicular adenopathy. A high right
paratracheal 1.3 cm node on image 22/series 2 measured 9 mm on the
prior exam.

Small AP window nodes are similar and not pathologic by size
criteria. No hilar adenopathy.

Lungs/Pleura: trace right pleural thickening. Status post right
upper lobectomy. Moderate centrilobular emphysema.

Linear density in the anterior lateral right lower lobe, including
image 87/ series 5, most consistent with scarring. There is also
bibasilar scarring, worse on the right. mild paraseptal emphysema.

Upper Abdomen: Hepatic steatosis. Normal imaged portions of the
spleen, stomach, pancreas, adrenal glands, left kidney. A too small
to characterize interpolar right renal lesion is likely a cyst.

Musculoskeletal: Posterior right upper rib deformity is likely
related to thoracotomy. No suspicious focal osseous lesion.
IMPRESSION: 1. Status post right upper lobectomy, without locally recurrent
disease.
2. Enlargement of a high right paratracheal node which could be
reactive or represent isolated nodal metastasis. This could either
be re-evaluated at followup CT or more entirely evaluated with PET.
3. Hepatic steatosis.
4. Age advanced coronary artery atherosclerosis. Recommend
assessment of coronary risk factors and consideration of medical
therapy.

## 2017-07-04 DIAGNOSIS — Q381 Ankyloglossia: Secondary | ICD-10-CM | POA: Diagnosis not present

## 2017-07-05 DIAGNOSIS — Q381 Ankyloglossia: Secondary | ICD-10-CM | POA: Diagnosis not present

## 2017-07-06 DIAGNOSIS — Q381 Ankyloglossia: Secondary | ICD-10-CM | POA: Diagnosis not present

## 2017-07-07 DIAGNOSIS — Q381 Ankyloglossia: Secondary | ICD-10-CM | POA: Diagnosis not present

## 2017-07-08 DIAGNOSIS — Q381 Ankyloglossia: Secondary | ICD-10-CM | POA: Diagnosis not present

## 2017-07-09 DIAGNOSIS — Q381 Ankyloglossia: Secondary | ICD-10-CM | POA: Diagnosis not present

## 2017-07-10 DIAGNOSIS — Q381 Ankyloglossia: Secondary | ICD-10-CM | POA: Diagnosis not present

## 2017-07-11 DIAGNOSIS — Q381 Ankyloglossia: Secondary | ICD-10-CM | POA: Diagnosis not present

## 2017-07-12 ENCOUNTER — Encounter: Payer: Self-pay | Admitting: Internal Medicine

## 2017-07-12 DIAGNOSIS — Q381 Ankyloglossia: Secondary | ICD-10-CM | POA: Diagnosis not present

## 2017-07-13 DIAGNOSIS — Q381 Ankyloglossia: Secondary | ICD-10-CM | POA: Diagnosis not present

## 2017-07-14 DIAGNOSIS — Q381 Ankyloglossia: Secondary | ICD-10-CM | POA: Diagnosis not present

## 2017-07-15 DIAGNOSIS — Q381 Ankyloglossia: Secondary | ICD-10-CM | POA: Diagnosis not present

## 2017-07-16 DIAGNOSIS — Q381 Ankyloglossia: Secondary | ICD-10-CM | POA: Diagnosis not present

## 2017-07-17 DIAGNOSIS — Q381 Ankyloglossia: Secondary | ICD-10-CM | POA: Diagnosis not present

## 2017-07-18 DIAGNOSIS — Q381 Ankyloglossia: Secondary | ICD-10-CM | POA: Diagnosis not present

## 2017-07-19 DIAGNOSIS — Q381 Ankyloglossia: Secondary | ICD-10-CM | POA: Diagnosis not present

## 2017-07-20 DIAGNOSIS — Q381 Ankyloglossia: Secondary | ICD-10-CM | POA: Diagnosis not present

## 2017-07-21 ENCOUNTER — Other Ambulatory Visit: Payer: Self-pay | Admitting: Internal Medicine

## 2017-07-21 DIAGNOSIS — Q381 Ankyloglossia: Secondary | ICD-10-CM | POA: Diagnosis not present

## 2017-07-21 DIAGNOSIS — J449 Chronic obstructive pulmonary disease, unspecified: Secondary | ICD-10-CM

## 2017-07-22 DIAGNOSIS — Q381 Ankyloglossia: Secondary | ICD-10-CM | POA: Diagnosis not present

## 2017-07-23 DIAGNOSIS — Q381 Ankyloglossia: Secondary | ICD-10-CM | POA: Diagnosis not present

## 2017-07-24 DIAGNOSIS — Q381 Ankyloglossia: Secondary | ICD-10-CM | POA: Diagnosis not present

## 2017-07-25 DIAGNOSIS — Q381 Ankyloglossia: Secondary | ICD-10-CM | POA: Diagnosis not present

## 2017-07-26 DIAGNOSIS — Q381 Ankyloglossia: Secondary | ICD-10-CM | POA: Diagnosis not present

## 2017-07-27 DIAGNOSIS — Q381 Ankyloglossia: Secondary | ICD-10-CM | POA: Diagnosis not present

## 2017-07-28 DIAGNOSIS — Q381 Ankyloglossia: Secondary | ICD-10-CM | POA: Diagnosis not present

## 2017-07-29 DIAGNOSIS — Q381 Ankyloglossia: Secondary | ICD-10-CM | POA: Diagnosis not present

## 2017-07-30 DIAGNOSIS — Q381 Ankyloglossia: Secondary | ICD-10-CM | POA: Diagnosis not present

## 2017-07-31 ENCOUNTER — Other Ambulatory Visit: Payer: Self-pay | Admitting: Internal Medicine

## 2017-07-31 DIAGNOSIS — Q381 Ankyloglossia: Secondary | ICD-10-CM | POA: Diagnosis not present

## 2017-08-01 DIAGNOSIS — Q381 Ankyloglossia: Secondary | ICD-10-CM | POA: Diagnosis not present

## 2017-08-02 ENCOUNTER — Encounter: Payer: Self-pay | Admitting: Internal Medicine

## 2017-08-02 DIAGNOSIS — Q381 Ankyloglossia: Secondary | ICD-10-CM | POA: Diagnosis not present

## 2017-08-03 ENCOUNTER — Emergency Department (HOSPITAL_COMMUNITY): Payer: Medicare HMO

## 2017-08-03 ENCOUNTER — Observation Stay (HOSPITAL_BASED_OUTPATIENT_CLINIC_OR_DEPARTMENT_OTHER): Payer: Medicare HMO

## 2017-08-03 ENCOUNTER — Observation Stay (HOSPITAL_COMMUNITY)
Admission: EM | Admit: 2017-08-03 | Discharge: 2017-08-04 | Disposition: A | Discharge status: Home / Self Care | Payer: Medicare HMO | Attending: Internal Medicine | Admitting: Internal Medicine

## 2017-08-03 ENCOUNTER — Encounter (HOSPITAL_COMMUNITY): Payer: Self-pay | Admitting: *Deleted

## 2017-08-03 ENCOUNTER — Other Ambulatory Visit: Payer: Self-pay

## 2017-08-03 ENCOUNTER — Observation Stay (HOSPITAL_COMMUNITY): Payer: Medicare HMO

## 2017-08-03 DIAGNOSIS — F17210 Nicotine dependence, cigarettes, uncomplicated: Secondary | ICD-10-CM | POA: Diagnosis not present

## 2017-08-03 DIAGNOSIS — F200 Paranoid schizophrenia: Secondary | ICD-10-CM | POA: Diagnosis not present

## 2017-08-03 DIAGNOSIS — R079 Chest pain, unspecified: Secondary | ICD-10-CM | POA: Diagnosis not present

## 2017-08-03 DIAGNOSIS — I739 Peripheral vascular disease, unspecified: Secondary | ICD-10-CM | POA: Diagnosis present

## 2017-08-03 DIAGNOSIS — I110 Hypertensive heart disease with heart failure: Secondary | ICD-10-CM | POA: Insufficient documentation

## 2017-08-03 DIAGNOSIS — R0789 Other chest pain: Principal | ICD-10-CM | POA: Insufficient documentation

## 2017-08-03 DIAGNOSIS — C3490 Malignant neoplasm of unspecified part of unspecified bronchus or lung: Secondary | ICD-10-CM | POA: Diagnosis present

## 2017-08-03 DIAGNOSIS — D696 Thrombocytopenia, unspecified: Secondary | ICD-10-CM

## 2017-08-03 DIAGNOSIS — G4731 Primary central sleep apnea: Secondary | ICD-10-CM | POA: Diagnosis not present

## 2017-08-03 DIAGNOSIS — R0602 Shortness of breath: Secondary | ICD-10-CM | POA: Diagnosis not present

## 2017-08-03 DIAGNOSIS — E119 Type 2 diabetes mellitus without complications: Secondary | ICD-10-CM | POA: Diagnosis not present

## 2017-08-03 DIAGNOSIS — C3491 Malignant neoplasm of unspecified part of right bronchus or lung: Secondary | ICD-10-CM

## 2017-08-03 DIAGNOSIS — I509 Heart failure, unspecified: Secondary | ICD-10-CM | POA: Diagnosis not present

## 2017-08-03 DIAGNOSIS — J45909 Unspecified asthma, uncomplicated: Secondary | ICD-10-CM | POA: Diagnosis not present

## 2017-08-03 DIAGNOSIS — Z7251 High risk heterosexual behavior: Secondary | ICD-10-CM

## 2017-08-03 DIAGNOSIS — Z79899 Other long term (current) drug therapy: Secondary | ICD-10-CM | POA: Diagnosis not present

## 2017-08-03 DIAGNOSIS — F1010 Alcohol abuse, uncomplicated: Secondary | ICD-10-CM | POA: Diagnosis not present

## 2017-08-03 DIAGNOSIS — I10 Essential (primary) hypertension: Secondary | ICD-10-CM

## 2017-08-03 DIAGNOSIS — R071 Chest pain on breathing: Secondary | ICD-10-CM | POA: Diagnosis not present

## 2017-08-03 LAB — BASIC METABOLIC PANEL
ANION GAP: 9 (ref 5–15)
BUN: <5 mg/dL — ABNORMAL LOW (ref 6–20)
CALCIUM: 8.3 mg/dL — AB (ref 8.9–10.3)
CHLORIDE: 111 mmol/L (ref 101–111)
CO2: 20 mmol/L — AB (ref 22–32)
CREATININE: 0.7 mg/dL (ref 0.61–1.24)
GFR calc Af Amer: >60 mL/min (ref >60)
GFR calc non Af Amer: >60 mL/min (ref >60)
GLUCOSE: 111 mg/dL — AB (ref 65–99)
Potassium: 4 mmol/L (ref 3.5–5.1)
SODIUM: 140 mmol/L (ref 135–145)

## 2017-08-03 LAB — TROPONIN I
Troponin I: <0.03 ng/mL (ref <0.03)
Troponin I: <0.03 ng/mL (ref <0.03)

## 2017-08-03 LAB — RAPID URINE DRUG SCREEN, HOSP PERFORMED
AMPHETAMINES: NOT DETECTED
BENZODIAZEPINES: NOT DETECTED
Barbiturates: NOT DETECTED
Cocaine: NOT DETECTED
OPIATES: NOT DETECTED
TETRAHYDROCANNABINOL: POSITIVE — AB

## 2017-08-03 LAB — CBC WITH DIFFERENTIAL/PLATELET
BASOS ABS: 0 10*3/uL (ref 0.0–0.1)
BASOS PCT: 0 %
EOS ABS: 0.1 10*3/uL (ref 0.0–0.7)
EOS PCT: 2 %
HCT: 48.5 % (ref 39.0–52.0)
Hemoglobin: 16.7 g/dL (ref 13.0–17.0)
LYMPHS ABS: 2.7 10*3/uL (ref 0.7–4.0)
Lymphocytes Relative: 44 %
MCH: 31.4 pg (ref 26.0–34.0)
MCHC: 34.4 g/dL (ref 30.0–36.0)
MCV: 91.2 fL (ref 78.0–100.0)
Monocytes Absolute: 0.5 10*3/uL (ref 0.1–1.0)
Monocytes Relative: 8 %
NEUTROS PCT: 46 %
Neutro Abs: 2.9 10*3/uL (ref 1.7–7.7)
PLATELETS: 129 10*3/uL — AB (ref 150–400)
RBC: 5.32 MIL/uL (ref 4.22–5.81)
RDW: 17.1 % — ABNORMAL HIGH (ref 11.5–15.5)
WBC: 6.2 10*3/uL (ref 4.0–10.5)

## 2017-08-03 LAB — D-DIMER, QUANTITATIVE: D-Dimer, Quant: 3.27 ug/mL-FEU — ABNORMAL HIGH (ref 0.00–0.50)

## 2017-08-03 LAB — INFLUENZA PANEL BY PCR (TYPE A & B)
Influenza A By PCR: NEGATIVE
Influenza B By PCR: NEGATIVE

## 2017-08-03 LAB — I-STAT TROPONIN, ED: Troponin i, poc: 0 ng/mL (ref 0.00–0.08)

## 2017-08-03 LAB — ECHOCARDIOGRAM COMPLETE: Height: 72 in

## 2017-08-03 MED ORDER — PREDNISONE 20 MG PO TABS
50.0000 mg | ORAL_TABLET | Freq: Every day | ORAL | Status: DC
  Administered 2017-08-03: 50 mg via ORAL
  Filled 2017-08-03 (×2): qty 3

## 2017-08-03 MED ORDER — LORAZEPAM 2 MG/ML IJ SOLN: 0.0000 mg | Freq: Two times a day (BID) | INTRAMUSCULAR | Status: DC

## 2017-08-03 MED ORDER — VILAZODONE HCL 40 MG PO TABS
40.0000 mg | ORAL_TABLET | Freq: Every day | ORAL | Status: DC
  Administered 2017-08-03 – 2017-08-04 (×2): 40 mg via ORAL
  Filled 2017-08-03 (×2): qty 1

## 2017-08-03 MED ORDER — QUETIAPINE FUMARATE 100 MG PO TABS
100.0000 mg | ORAL_TABLET | Freq: Every day | ORAL | Status: DC
  Administered 2017-08-03: 100 mg via ORAL
  Filled 2017-08-03: qty 2
  Filled 2017-08-03: qty 1

## 2017-08-03 MED ORDER — ACETAMINOPHEN 325 MG PO TABS
650.0000 mg | ORAL_TABLET | ORAL | Status: DC | PRN
  Administered 2017-08-03: 650 mg via ORAL
  Filled 2017-08-03: qty 2

## 2017-08-03 MED ORDER — ENOXAPARIN SODIUM 40 MG/0.4ML ~~LOC~~ SOLN
40.0000 mg | SUBCUTANEOUS | Status: DC
  Administered 2017-08-03 – 2017-08-04 (×2): 40 mg via SUBCUTANEOUS
  Filled 2017-08-03 (×3): qty 0.4

## 2017-08-03 MED ORDER — ASPIRIN EC 81 MG PO TBEC
81.0000 mg | DELAYED_RELEASE_TABLET | Freq: Every day | ORAL | Status: DC
  Administered 2017-08-04: 81 mg via ORAL
  Filled 2017-08-03: qty 1

## 2017-08-03 MED ORDER — LORAZEPAM 2 MG/ML IJ SOLN
0.0000 mg | Freq: Four times a day (QID) | INTRAMUSCULAR | Status: DC
  Administered 2017-08-03 – 2017-08-04 (×2): 1 mg via INTRAVENOUS
  Administered 2017-08-04: 2 mg via INTRAVENOUS
  Filled 2017-08-03 (×3): qty 1

## 2017-08-03 MED ORDER — UMECLIDINIUM-VILANTEROL 62.5-25 MCG/INH IN AEPB
1.0000 | INHALATION_SPRAY | Freq: Every day | RESPIRATORY_TRACT | Status: DC
  Administered 2017-08-03 – 2017-08-04 (×2): 1 via RESPIRATORY_TRACT
  Filled 2017-08-03 (×2): qty 14

## 2017-08-03 MED ORDER — THIAMINE HCL 100 MG/ML IJ SOLN: 100.0000 mg | Freq: Every day | INTRAMUSCULAR | Status: DC

## 2017-08-03 MED ORDER — ASPIRIN 81 MG PO CHEW
324.0000 mg | CHEWABLE_TABLET | Freq: Once | ORAL | Status: AC
  Administered 2017-08-03: 324 mg via ORAL
  Filled 2017-08-03: qty 4

## 2017-08-03 MED ORDER — CARVEDILOL 12.5 MG PO TABS
12.5000 mg | ORAL_TABLET | Freq: Two times a day (BID) | ORAL | Status: DC
  Administered 2017-08-03 – 2017-08-04 (×3): 12.5 mg via ORAL
  Filled 2017-08-03 (×3): qty 1

## 2017-08-03 MED ORDER — NICOTINE 21 MG/24HR TD PT24
21.0000 mg | MEDICATED_PATCH | Freq: Once | TRANSDERMAL | Status: AC
  Administered 2017-08-03: 21 mg via TRANSDERMAL
  Filled 2017-08-03: qty 1

## 2017-08-03 MED ORDER — ZOLPIDEM TARTRATE 5 MG PO TABS: 5.0000 mg | ORAL_TABLET | Freq: Every evening | ORAL | Status: DC | PRN

## 2017-08-03 MED ORDER — ALPRAZOLAM 0.25 MG PO TABS: 0.2500 mg | ORAL_TABLET | Freq: Two times a day (BID) | ORAL | Status: DC | PRN

## 2017-08-03 MED ORDER — EMTRICITABINE-TENOFOVIR AF 200-25 MG PO TABS
1.0000 | ORAL_TABLET | Freq: Every day | ORAL | Status: DC
  Administered 2017-08-03 – 2017-08-04 (×2): 1 via ORAL
  Filled 2017-08-03 (×2): qty 1

## 2017-08-03 MED ORDER — LORAZEPAM 1 MG PO TABS
1.0000 mg | ORAL_TABLET | Freq: Four times a day (QID) | ORAL | Status: DC | PRN
  Administered 2017-08-03: 1 mg via ORAL
  Filled 2017-08-03: qty 1

## 2017-08-03 MED ORDER — IOPAMIDOL (ISOVUE-370) INJECTION 76%
INTRAVENOUS | Status: AC
  Administered 2017-08-03: 80 mL via INTRAVENOUS
  Filled 2017-08-03: qty 100

## 2017-08-03 MED ORDER — LORATADINE 10 MG PO TABS: 10.0000 mg | ORAL_TABLET | Freq: Every day | ORAL | Status: DC | PRN

## 2017-08-03 MED ORDER — ALBUTEROL SULFATE (2.5 MG/3ML) 0.083% IN NEBU
3.0000 mL | INHALATION_SOLUTION | Freq: Four times a day (QID) | RESPIRATORY_TRACT | Status: DC | PRN
  Administered 2017-08-04: 3 mL via RESPIRATORY_TRACT
  Filled 2017-08-03 (×2): qty 3

## 2017-08-03 MED ORDER — PANTOPRAZOLE SODIUM 40 MG PO TBEC
40.0000 mg | DELAYED_RELEASE_TABLET | Freq: Every day | ORAL | Status: DC
  Administered 2017-08-03 – 2017-08-04 (×2): 40 mg via ORAL
  Filled 2017-08-03 (×2): qty 1

## 2017-08-03 MED ORDER — NITROGLYCERIN 0.4 MG SL SUBL
0.4000 mg | SUBLINGUAL_TABLET | SUBLINGUAL | Status: DC | PRN
  Administered 2017-08-03: 0.4 mg via SUBLINGUAL
  Filled 2017-08-03: qty 1

## 2017-08-03 MED ORDER — LORAZEPAM 2 MG/ML IJ SOLN: 1.0000 mg | Freq: Four times a day (QID) | INTRAMUSCULAR | Status: DC | PRN

## 2017-08-03 MED ORDER — ADULT MULTIVITAMIN W/MINERALS CH
1.0000 | ORAL_TABLET | Freq: Every day | ORAL | Status: DC
  Administered 2017-08-03 – 2017-08-04 (×2): 1 via ORAL
  Filled 2017-08-03 (×2): qty 1

## 2017-08-03 MED ORDER — TRAZODONE HCL 50 MG PO TABS
300.0000 mg | ORAL_TABLET | Freq: Every day | ORAL | Status: DC
  Administered 2017-08-03: 300 mg via ORAL
  Filled 2017-08-03: qty 6

## 2017-08-03 MED ORDER — PRAVASTATIN SODIUM 40 MG PO TABS
40.0000 mg | ORAL_TABLET | Freq: Every day | ORAL | Status: DC
  Administered 2017-08-03: 40 mg via ORAL
  Filled 2017-08-03: qty 1

## 2017-08-03 MED ORDER — MORPHINE SULFATE (PF) 4 MG/ML IV SOLN
2.0000 mg | INTRAVENOUS | Status: DC | PRN
  Filled 2017-08-03: qty 1

## 2017-08-03 MED ORDER — FOLIC ACID 1 MG PO TABS
1.0000 mg | ORAL_TABLET | Freq: Every day | ORAL | Status: DC
  Administered 2017-08-03 – 2017-08-04 (×2): 1 mg via ORAL
  Filled 2017-08-03 (×2): qty 1

## 2017-08-03 MED ORDER — VITAMIN B-1 100 MG PO TABS
100.0000 mg | ORAL_TABLET | Freq: Every day | ORAL | Status: DC
  Administered 2017-08-03 – 2017-08-04 (×2): 100 mg via ORAL
  Filled 2017-08-03 (×2): qty 1

## 2017-08-03 MED ORDER — ONDANSETRON HCL 4 MG/2ML IJ SOLN: 4.0000 mg | Freq: Four times a day (QID) | INTRAMUSCULAR | Status: DC | PRN

## 2017-08-03 NOTE — Progress Notes (Addendum)
[facility] OF CARE NOTE Patient: [name] WSF:[id number]   PCP: [name], MD DOB: [birth year]   DOA: 08/03/2017   DOS: 08/03/2017    Patient was admitted by my colleague Dr. Myna Hidalgo earlier on 08/03/2017. I have reviewed the H&P as well as assessment and plan and agree with the same. Important changes in the plan are listed below.  Plan of care: Principal Problem:   Chest pain Active Problems:   Thrombocytopenia ([facility])   HYPERTENSION, BENIGN   PAD (peripheral artery disease) ([facility])   Paranoid schizophrenia ([place])   Non-small cell carcinoma of lung, stage 1 ([facility])   Sleep apnea, primary central  d dimer elevated Follow up on CT chest Check Echocardiogram   ETOH abuse Follow CIWA  HIV Continue current regimen  Stage IA (T1a, N0, M0) non-small cell right upper lobe lung cancer, adenocarcinoma diagnosed in November 2016 S/P wedge resection right upper lobe nodule, and thoracoscopic right upper lobectomy under the care of Dr. Roxan Hockey on 09/04/2015. On Observation right now  Author: Berle Mull, MD [facility] Pager: [phone] 08/03/2017 7:29 AM   If 7PM-7AM, please contact night-coverage at [email or web address], password [facility]

## 2017-08-03 NOTE — ED Triage Notes (Signed)
Presents to ed via [facility] states he got up approx. 4am to smoke a cigarette and started having sob then had sharp stabbing chest pain  With radiation to the left arm.
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
## 2017-08-03 NOTE — Progress Notes (Signed)
  Echocardiogram 2D Echocardiogram has been performed.  [name] 08/03/2017, 2:50 PM

## 2017-08-04 DIAGNOSIS — G4731 Primary central sleep apnea: Secondary | ICD-10-CM | POA: Diagnosis not present

## 2017-08-04 DIAGNOSIS — D696 Thrombocytopenia, unspecified: Secondary | ICD-10-CM | POA: Diagnosis not present

## 2017-08-04 DIAGNOSIS — F200 Paranoid schizophrenia: Secondary | ICD-10-CM | POA: Diagnosis not present

## 2017-08-04 DIAGNOSIS — J45909 Unspecified asthma, uncomplicated: Secondary | ICD-10-CM | POA: Diagnosis not present

## 2017-08-04 DIAGNOSIS — R0789 Other chest pain: Secondary | ICD-10-CM | POA: Diagnosis not present

## 2017-08-04 DIAGNOSIS — C3490 Malignant neoplasm of unspecified part of unspecified bronchus or lung: Secondary | ICD-10-CM | POA: Diagnosis not present

## 2017-08-04 DIAGNOSIS — R079 Chest pain, unspecified: Secondary | ICD-10-CM | POA: Diagnosis not present

## 2017-08-04 DIAGNOSIS — I110 Hypertensive heart disease with heart failure: Secondary | ICD-10-CM | POA: Diagnosis not present

## 2017-08-04 DIAGNOSIS — I509 Heart failure, unspecified: Secondary | ICD-10-CM | POA: Diagnosis not present

## 2017-08-04 DIAGNOSIS — I739 Peripheral vascular disease, unspecified: Secondary | ICD-10-CM | POA: Diagnosis not present

## 2017-08-04 LAB — CBC WITH DIFFERENTIAL/PLATELET
BASOS ABS: 0 10*3/uL (ref 0.0–0.1)
Basophils Relative: 0 %
EOS ABS: 0 10*3/uL (ref 0.0–0.7)
Eosinophils Relative: 0 %
HCT: 48.8 % (ref 39.0–52.0)
HEMOGLOBIN: 16.4 g/dL (ref 13.0–17.0)
LYMPHS ABS: 1.9 10*3/uL (ref 0.7–4.0)
Lymphocytes Relative: 29 %
MCH: 30.8 pg (ref 26.0–34.0)
MCHC: 33.6 g/dL (ref 30.0–36.0)
MCV: 91.6 fL (ref 78.0–100.0)
Monocytes Absolute: 0.7 10*3/uL (ref 0.1–1.0)
Monocytes Relative: 10 %
NEUTROS PCT: 61 %
Neutro Abs: 4 10*3/uL (ref 1.7–7.7)
Platelets: 128 10*3/uL — ABNORMAL LOW (ref 150–400)
RBC: 5.33 MIL/uL (ref 4.22–5.81)
RDW: 16.7 % — ABNORMAL HIGH (ref 11.5–15.5)
WBC: 6.5 10*3/uL (ref 4.0–10.5)

## 2017-08-04 LAB — COMPREHENSIVE METABOLIC PANEL
ALT: 61 U/L (ref 17–63)
AST: 55 U/L — ABNORMAL HIGH (ref 15–41)
Albumin: 3.4 g/dL — ABNORMAL LOW (ref 3.5–5.0)
Alkaline Phosphatase: 70 U/L (ref 38–126)
Anion gap: 9 (ref 5–15)
BUN: 9 mg/dL (ref 6–20)
CHLORIDE: 106 mmol/L (ref 101–111)
CO2: 24 mmol/L (ref 22–32)
CREATININE: 0.92 mg/dL (ref 0.61–1.24)
Calcium: 9.1 mg/dL (ref 8.9–10.3)
GFR calc Af Amer: >60 mL/min (ref >60)
Glucose, Bld: 113 mg/dL — ABNORMAL HIGH (ref 65–99)
POTASSIUM: 4.3 mmol/L (ref 3.5–5.1)
Sodium: 139 mmol/L (ref 135–145)
Total Bilirubin: 1.1 mg/dL (ref 0.3–1.2)
Total Protein: 6.8 g/dL (ref 6.5–8.1)

## 2017-08-04 LAB — MAGNESIUM: MAGNESIUM: 2.2 mg/dL (ref 1.7–2.4)

## 2017-08-04 LAB — GLUCOSE, CAPILLARY: GLUCOSE-CAPILLARY: 95 mg/dL (ref 65–99)

## 2017-08-04 MED ORDER — LOSARTAN POTASSIUM 25 MG PO TABS: 25.0000 mg | ORAL_TABLET | Freq: Every day | ORAL | 0 refills | Status: DC

## 2017-08-04 MED ORDER — NICOTINE 21 MG/24HR TD PT24
21.0000 mg | MEDICATED_PATCH | Freq: Every day | TRANSDERMAL | Status: DC
  Administered 2017-08-04: 21 mg via TRANSDERMAL
  Filled 2017-08-04: qty 1

## 2017-08-04 MED ORDER — PREDNISONE 20 MG PO TABS: 40.0000 mg | ORAL_TABLET | Freq: Every day | ORAL | Status: DC

## 2017-08-04 MED ORDER — THIAMINE HCL 100 MG PO TABS: 100.0000 mg | ORAL_TABLET | Freq: Every day | ORAL | 0 refills | Status: DC

## 2017-08-04 MED ORDER — PREDNISONE 10 MG PO TABS: ORAL_TABLET | ORAL | 0 refills | Status: DC

## 2017-08-04 MED ORDER — NICOTINE 21 MG/24HR TD PT24: 21.0000 mg | MEDICATED_PATCH | Freq: Every day | TRANSDERMAL | 0 refills | Status: DC

## 2017-08-04 MED ORDER — LOSARTAN POTASSIUM 25 MG PO TABS
25.0000 mg | ORAL_TABLET | Freq: Every day | ORAL | Status: DC
  Administered 2017-08-04: 25 mg via ORAL
  Filled 2017-08-04: qty 1

## 2017-08-04 NOTE — Care Management Obs Status (Signed)
[facility] NOTIFICATION   Patient Details  Name: QUASHAUN LAZALDE MRN: [id number] Date of Birth: 1961/08/13   Medicare Observation Status Notification Given:  Yes    Bethena Roys, RN 08/04/2017, 2:57 PM

## 2017-08-04 NOTE — Progress Notes (Signed)
Pt discharged to home, condition stable, discharge instructions including medications, follow up appointments, reasons for returning for tx explained to pt and family member, verbalized understanding ambulated to private vehicle accompanied by family member.

## 2017-08-04 NOTE — Discharge Instructions (Signed)
Chest Wall Pain °Chest wall pain is pain in or around the bones and muscles of your chest. Sometimes, an injury causes this pain. Sometimes, the cause may not be known. This pain may take several weeks or longer to get better. °Follow these instructions at home: °Pay attention to any changes in your symptoms. Take these actions to help with your pain: °· Rest as told by your health care provider. °· Avoid activities that cause pain. These include any activities that use your chest muscles or your abdominal and side muscles to lift heavy items. °· If directed, apply ice to the painful area: °¨ Put ice in a plastic bag. °¨ Place a towel between your skin and the bag. °¨ Leave the ice on for 20 minutes, 2-3 times per day. °· Take over-the-counter and prescription medicines only as told by your health care provider. °· Do not use tobacco products, including cigarettes, chewing tobacco, and e-cigarettes. If you need help quitting, ask your health care provider. °· Keep all follow-up visits as told by your health care provider. This is important. °Contact a health care provider if: °· You have a fever. °· Your chest pain becomes worse. °· You have new symptoms. °Get help right away if: °· You have nausea or vomiting. °· You feel sweaty or light-headed. °· You have a cough with phlegm (sputum) or you cough up blood. °· You develop shortness of breath. °This information is not intended to replace advice given to you by your health care provider. Make sure you discuss any questions you have with your health care provider. °Document Released: 09/12/2005 Document Revised: 01/21/2016 Document Reviewed: 12/08/2014 °[employer] Interactive Patient Education © 2017 [employer] ° °

## 2017-08-05 DIAGNOSIS — Q381 Ankyloglossia: Secondary | ICD-10-CM | POA: Diagnosis not present

## 2017-08-06 DIAGNOSIS — Q381 Ankyloglossia: Secondary | ICD-10-CM | POA: Diagnosis not present

## 2017-08-07 DIAGNOSIS — Q381 Ankyloglossia: Secondary | ICD-10-CM | POA: Diagnosis not present

## 2017-08-07 NOTE — Discharge Summary (Signed)
[facility] Discharge Summary   Patient: [name] TFT:[id number]   PCP: [name], MD DOB: 1961-08-25   Date of admission: 08/03/2017   Date of discharge: 08/04/2017    Discharge Diagnoses:  Principal Problem:   Chest pain Active Problems:   Thrombocytopenia (HCC)   HYPERTENSION, BENIGN   PAD (peripheral artery disease) (HCC)   Paranoid schizophrenia ([place])   Non-small cell carcinoma of lung, stage 1 (HCC)   Sleep apnea, primary central   Admitted From: home Disposition:  Home   Recommendations for Outpatient Follow-up:  1. Please follow-up with PCP in 1 week. 2. Please establish care with [facility] cardiology for further treatment for cardiomyopathy.  Follow-up Information    [name], MD. Schedule an appointment as soon as possible for a visit in 1 week(s).   Specialty:  Internal Medicine Contact information: [address] [place] [address] [phone]        [facility]. Schedule an appointment as soon as possible for a visit in 1 month(s).   Specialty:  Cardiology Why:  office will call you.  Contact information: [address], [place] [id number]         Diet recommendation: Cardiac diet  Activity: The patient is advised to gradually reintroduce usual activities.  Discharge Condition: good  Code Status: Full code  History of present illness: As per the H and P dictated on admission, "[name] is a 56 y.o. male with medical history significant for schizophrenia, stage IA non-small cell lung cancer status post right upper lobectomy, drug and alcohol abuse, COPD, hypertension, and drug abuse, now presenting to the emergency department for evaluation of chest pain.  Patient reports that he went to bed in his usual state of health, but upon waking, noticed some chest pain with radiation to the left arm.  Pain is described as sharp, severe,  associated with shortness of breath and diaphoresis, and with no alleviating or exacerbating factors identified.  Patient denies any recent cocaine use.  ED Course: Upon arrival to the ED, patient is found to be afebrile, saturating well on room air, and with vitals otherwise stable.  EKG features a normal sinus rhythm and chest x-ray is notable for postoperative changes, but no acute cardiopulmonary disease.  Chemistry panel is unremarkable, CBC is notable for mild chronic thrombocytopenia, and initial troponin is undetectable.  Patient was treated with 324 mg of aspirin and sublingual nitroglycerin with improvement in his symptoms.  He remains hemodynamically stable, in no apparent respiratory distress, and will be observed on the telemetry unit for ongoing evaluation and management of chest pain with concern for possible ACS.  "  Hospital Course:  Summary of his active problems in the hospital is as following. Principal Problem:   Chest pain, atypical. Patient presented with pleuritic chest pain located on the left side worsening with breathing. Improved with prednisone. EKG unremarkable, serial troponins are also negative.  No evidence of telemetry identified.  d dimer elevated, CT angios chest PE negative for acute PE pneumonia or any other acute cardiopulmonary process. Echocardiogram shows EF of 35-40% with moderate diffuse hypokinesis.  On review of the chart in August 2016 patient had a cardiac catheterization and was diagnosed with nonischemic cardiomyopathy with similar EF. Patient has not followed up with cardiology following that. At present since the chest pain has resolved, troponins are negative and less likely cardiac etiology of his  chest pain with improvement in oral prednisone patient will be discharged home with follow-up with cardiology for further workup.  Chronic systolic CHF. Euvolemic, no indication for diuretic right now. Added losartan. Continue Coreg. Unable to  tolerate antiplatelet medication due to nosebleeds and GI bleed in the past.  COPD. Current presentation more likely appears to be pleurisy, will treat with short course of prednisone. Continue inhalers at home.  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 Roxan Hockey on 09/04/2015. On Observation right now    Thrombocytopenia ([place]) Stable, for outpatient follow-up.    HYPERTENSION, BENIGN Blood pressure stable, started on losartan along with his home regimen follow-up as an outpatient.    PAD (peripheral artery disease) (HCC) Aspirin and Plavix because nosebleed therefore refused to take any antiplatelet medication right now.    Paranoid schizophrenia ([place]) Continue home regimen.  All other chronic medical condition were stable during the hospitalization.  Patient was ambulatory without any assistance. On the day of the discharge the patient's vitals were stable, and no other acute medical condition were reported by patient. the patient was felt safe to be discharge at home with family.  Procedures and Results:  Echocardiogram    Consultations:  Phone discussion with cardiology  DISCHARGE MEDICATION: Discharge Medication List as of 08/04/2017  3:15 PM    START taking these medications   Details  losartan (COZAAR) 25 MG tablet Take 1 tablet (25 mg total) daily by mouth., Starting Sat 08/05/2017, Normal    nicotine (NICODERM CQ - DOSED IN MG/24 HOURS) 21 mg/24hr patch Place 1 patch (21 mg total) daily onto the skin., Starting Sat 08/05/2017, Normal    predniSONE (DELTASONE) 10 MG tablet Take 30mg  daily for 1days,Take 20mg  daily for 1days,Take 10mg  daily for 1days, then stop., Normal      thiamine 100 MG tablet Take 1 tablet (100 mg total) daily by mouth., Starting Sat 08/05/2017, Normal      CONTINUE these medications which have NOT CHANGED   Details  carvedilol (COREG) 12.5 MG tablet Take 1 tablet (12.5 mg total) by mouth 2 (two) times daily with a meal., Starting Mon 07/31/2017, Normal    cetirizine (ZYRTEC) 10 MG tablet Take 1 tablet (10 mg total) by mouth daily., Starting Fri 03/03/2017, Normal    esomeprazole (NEXIUM) 40 MG capsule Take 1 capsule (40 mg total) by mouth every morning., Starting Fri 03/03/2017, Normal    Eszopiclone 3 MG TABS TAKE ONE TABLET BY MOUTH AT BEDTIME. Take immediately BEFORE bedtime, Print    pravastatin (PRAVACHOL) 40 MG tablet Take 1 tablet (40 mg total) by mouth daily., Starting Tue 05/16/2017, Normal    QUEtiapine (SEROQUEL) 100 MG tablet TAKE ONE TABLET BY MOUTH AT NIGHT, Normal    traZODone (DESYREL) 100 MG tablet TAKE THREE TABLETS BY MOUTH AT BEDTIME, Normal    TRUVADA 200-300 MG tablet Take 1 tablet by mouth daily., Starting Tue 03/14/2017, Normal    umeclidinium-vilanterol (ANORO ELLIPTA) 62.5-25 MCG/INH AEPB Inhale 1 puff into the lungs daily., Starting Wed 02/15/2017, Print    VENTOLIN HFA 108 (90 Base) MCG/ACT inhaler Inhale 1-2 puffs into the lungs every 6 (six) hours as needed for wheezing or shortness of breath., Starting Sat 07/22/2017, Normal    Vilazodone HCl (VIIBRYD)  40 MG TABS Take 1 tablet (40 mg total) by mouth daily., Starting Tue 07/04/2017, Normal       Allergies  Allergen Reactions  . Aspirin Other (See Comments)    Reaction:  Nose bleeds and GI bleeding   . Clopidogrel Bisulfate Other (See Comments)    Reaction:  Nose bleeds   . Crestor [Rosuvastatin Calcium] Other (See Comments)    Reaction:  Leg cramps   . Ace Inhibitors Cough   Discharge Instructions    Diet - low sodium heart healthy   Complete by:  As directed    Discharge instructions   Complete by:  As directed    It is important that you read  following instructions as well as go over your medication list with RN to help you understand your care after this hospitalization.  Discharge Instructions: Please follow-up with PCP in one week  Please request your primary care physician to go over all Hospital Tests and Procedure/Radiological results at the follow up,  Please get all Hospital records sent to your PCP by signing hospital release before you go home.   You were cared for by a hospitalist during your hospital stay. If you have any questions about your discharge medications or the care you received while you were in the hospital after you are discharged, you can call the unit and ask to speak with the hospitalist on call if the hospitalist that took care of you is not available.  Once you are discharged, your primary care physician will handle any further medical issues. Please note that NO REFILLS for any discharge medications will be authorized once you are discharged, as it is imperative that you return to your primary care physician (or establish a relationship with a primary care physician if you do not have one) for your aftercare needs so that they can reassess your need for medications and monitor your lab values. You Must read complete instructions/literature along with all the possible adverse reactions/side effects for all the Medicines you take and that have been prescribed to you. Take any new Medicines after you have completely understood and accept all the possible adverse reactions/side effects. Wear Seat belts while driving. If you have smoked or chewed Tobacco in the last 2 yrs please stop smoking and/or stop any Recreational drug use.   Increase activity slowly   Complete by:  As directed      Discharge Exam: Filed Weights   08/03/17 1737 08/04/17 0604  Weight: 87.5 kg (193 lb) 86.4 kg (190 lb 8 oz)   Vitals:   08/04/17 1300 08/04/17 1341  BP:    Pulse: 84   Resp: 18   Temp:    SpO2: 99% 98%   General:  Appear in no distress, no Rash; Oral Mucosa moist. Cardiovascular: S1 and S2 Present, no Murmur, no JVD Respiratory: Bilateral Air entry present and Clear to Auscultation, no Crackles, no wheezes Abdomen: Bowel Sound present, Soft and no tenderness Extremities: no Pedal edema, no calf tenderness Neurology: Grossly no focal neuro deficit.  The results of significant diagnostics from this hospitalization (including imaging, microbiology, ancillary and laboratory) are listed below for reference.    Significant Diagnostic Studies: Dg Chest 2 View  Result Date: 08/03/2017 CLINICAL DATA:  Shortness of breath with sharp stabbing chest pain radiating to the left arm. EXAM: CHEST  2 VIEW COMPARISON:  CT chest 04/20/2017.  Chest 01/27/2017 FINDINGS: Normal heart size and pulmonary vascularity. Linear scarring in the right lung base similar to  previous study. Surgical clips in the right axilla. No airspace disease or consolidation. No blunting of costophrenic angles. No pneumothorax. Mediastinal contours appear intact. IMPRESSION: Postoperative changes in the right lung. No evidence of active pulmonary disease. Electronically Signed   By: Lucienne Capers M.D.   On: 08/03/2017 05:46   Ct Angio Chest Pe W And/or Wo Contrast  Result Date: 08/03/2017 CLINICAL DATA:  56 year old male with left upper chest pain and shortness of breath for the past week. Patient has a history of lung cancer status post right upper lobectomy in 2016. EXAM: CT ANGIOGRAPHY CHEST WITH CONTRAST TECHNIQUE: Multidetector CT imaging of the chest was performed using the standard protocol during bolus administration of intravenous contrast. Multiplanar CT image reconstructions and MIPs were obtained to evaluate the vascular anatomy. CONTRAST:  80 mL Isovue 370 COMPARISON:  Prior CT scan of the chest 04/20/2017 FINDINGS: Cardiovascular: Satisfactory opacification of the pulmonary arteries to the segmental level. No evidence of pulmonary  embolism. Normal heart size. Calcifications are noted along the course of the left anterior descending coronary artery. No pericardial effusion. Mediastinum/Nodes: Unremarkable CT appearance of the thyroid gland. No suspicious mediastinal or hilar adenopathy. No soft tissue mediastinal mass. The thoracic esophagus is unremarkable. Lungs/Pleura: Surgical changes of prior right upper lobectomy again noted. There is compensatory hyperinflation of the right middle and lower lobes. Background of moderately severe centrilobular emphysema. No suspicious pulmonary nodules or masses. No focal airspace consolidation to suggest pneumonia. Minimal dependent atelectasis is present. Mild diffuse bronchial wall thickening a similar compared to prior. Upper Abdomen: Visualized upper abdominal organs are unremarkable. Musculoskeletal: No acute fracture or aggressive appearing lytic or blastic osseous lesion. 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 Review of the MIP images confirms the above findings. IMPRESSION: 1. Negative for acute pulmonary embolism, pneumonia or other acute cardiopulmonary process. 2. Stable surgical changes of prior right upper lobectomy without evidence of new or recurrent malignancy. 3. Coronary artery calcifications. Please note that although the presence of coronary artery calcium documents the presence of coronary artery disease, the severity of this disease and any potential stenosis cannot be assessed on this non-gated CT examination. Assessment for potential risk factor modification, dietary therapy or pharmacologic therapy may be warranted, if clinically indicated. Electronically Signed   By: Jacqulynn Cadet M.D.   On: 08/03/2017 08:07    Microbiology: No results found for this or any previous visit (from the past 240 hour(s)).   Labs: CBC: Recent Labs  Lab 08/03/17 0449 08/04/17 0428  WBC 6.2  6.5  NEUTROABS 2.9 4.0  HGB 16.7 16.4  HCT 48.5 48.8  MCV 91.2 91.6  PLT 129* 132*   Basic Metabolic Panel: Recent Labs  Lab 08/03/17 0449 08/04/17 0428  NA 140 139  K 4.0 4.3  CL 111 106  CO2 20* 24  GLUCOSE 111* 113*  BUN <5* 9  CREATININE 0.70 0.92  CALCIUM 8.3* 9.1  MG  --  2.2   Liver Function Tests: Recent Labs  Lab 08/04/17 0428  AST 55*  ALT 61  ALKPHOS 70  BILITOT 1.1  PROT 6.8  ALBUMIN 3.4*   No results for input(s): LIPASE, AMYLASE in the last 168 hours. No results for input(s): AMMONIA in the last 168 hours. Cardiac Enzymes: Recent Labs  Lab 08/03/17 0449 08/03/17 1203 08/03/17 1930  TROPONINI <0.03 <0.03 <0.03   BNP (last 3 results) Recent Labs  01/27/17 0957  BNP 15.5   CBG: Recent Labs  Lab 08/03/17 1728  GLUCAP 95   Time spent: 35 minutes  Signed:  Quilla Freeze  [facility] 08/04/2017 , 7:54 AM

## 2017-08-08 DIAGNOSIS — Q381 Ankyloglossia: Secondary | ICD-10-CM | POA: Diagnosis not present

## 2017-08-09 ENCOUNTER — Other Ambulatory Visit: Payer: Self-pay | Admitting: Internal Medicine

## 2017-08-09 DIAGNOSIS — E785 Hyperlipidemia, unspecified: Secondary | ICD-10-CM

## 2017-08-09 DIAGNOSIS — I739 Peripheral vascular disease, unspecified: Secondary | ICD-10-CM

## 2017-08-09 DIAGNOSIS — Q381 Ankyloglossia: Secondary | ICD-10-CM | POA: Diagnosis not present

## 2017-08-10 ENCOUNTER — Encounter: Payer: Self-pay | Admitting: Internal Medicine

## 2017-08-10 DIAGNOSIS — Q381 Ankyloglossia: Secondary | ICD-10-CM | POA: Diagnosis not present

## 2017-08-11 DIAGNOSIS — Q381 Ankyloglossia: Secondary | ICD-10-CM | POA: Diagnosis not present

## 2017-08-12 DIAGNOSIS — Q381 Ankyloglossia: Secondary | ICD-10-CM | POA: Diagnosis not present

## 2017-08-13 DIAGNOSIS — Q381 Ankyloglossia: Secondary | ICD-10-CM | POA: Diagnosis not present

## 2017-08-14 DIAGNOSIS — Q381 Ankyloglossia: Secondary | ICD-10-CM | POA: Diagnosis not present

## 2017-08-15 DIAGNOSIS — F331 Major depressive disorder, recurrent, moderate: Secondary | ICD-10-CM | POA: Diagnosis not present

## 2017-08-15 DIAGNOSIS — F1020 Alcohol dependence, uncomplicated: Secondary | ICD-10-CM | POA: Diagnosis not present

## 2017-08-15 DIAGNOSIS — F29 Unspecified psychosis not due to a substance or known physiological condition: Secondary | ICD-10-CM | POA: Diagnosis not present

## 2017-08-15 DIAGNOSIS — Q381 Ankyloglossia: Secondary | ICD-10-CM | POA: Diagnosis not present

## 2017-08-16 DIAGNOSIS — Q381 Ankyloglossia: Secondary | ICD-10-CM | POA: Diagnosis not present

## 2017-08-17 DIAGNOSIS — Q381 Ankyloglossia: Secondary | ICD-10-CM | POA: Diagnosis not present

## 2017-08-18 DIAGNOSIS — Q381 Ankyloglossia: Secondary | ICD-10-CM | POA: Diagnosis not present

## 2017-08-19 DIAGNOSIS — Q381 Ankyloglossia: Secondary | ICD-10-CM | POA: Diagnosis not present

## 2017-08-20 DIAGNOSIS — Q381 Ankyloglossia: Secondary | ICD-10-CM | POA: Diagnosis not present

## 2017-08-21 DIAGNOSIS — Q381 Ankyloglossia: Secondary | ICD-10-CM | POA: Diagnosis not present

## 2017-08-22 ENCOUNTER — Ambulatory Visit: Payer: Self-pay | Admitting: Internal Medicine

## 2017-08-22 DIAGNOSIS — Q381 Ankyloglossia: Secondary | ICD-10-CM | POA: Diagnosis not present

## 2017-08-23 DIAGNOSIS — Z114 Encounter for screening for human immunodeficiency virus [HIV]: Secondary | ICD-10-CM | POA: Diagnosis not present

## 2017-08-23 DIAGNOSIS — Z79899 Other long term (current) drug therapy: Secondary | ICD-10-CM | POA: Diagnosis not present

## 2017-08-23 DIAGNOSIS — Z1159 Encounter for screening for other viral diseases: Secondary | ICD-10-CM | POA: Diagnosis not present

## 2017-08-23 DIAGNOSIS — Q381 Ankyloglossia: Secondary | ICD-10-CM | POA: Diagnosis not present

## 2017-08-23 DIAGNOSIS — R5383 Other fatigue: Secondary | ICD-10-CM | POA: Diagnosis not present

## 2017-08-23 DIAGNOSIS — F331 Major depressive disorder, recurrent, moderate: Secondary | ICD-10-CM | POA: Diagnosis not present

## 2017-08-23 DIAGNOSIS — Z7901 Long term (current) use of anticoagulants: Secondary | ICD-10-CM | POA: Diagnosis not present

## 2017-08-23 DIAGNOSIS — F1020 Alcohol dependence, uncomplicated: Secondary | ICD-10-CM | POA: Diagnosis not present

## 2017-08-23 DIAGNOSIS — F29 Unspecified psychosis not due to a substance or known physiological condition: Secondary | ICD-10-CM | POA: Diagnosis not present

## 2017-08-23 DIAGNOSIS — Z131 Encounter for screening for diabetes mellitus: Secondary | ICD-10-CM | POA: Diagnosis not present

## 2017-08-24 DIAGNOSIS — Q381 Ankyloglossia: Secondary | ICD-10-CM | POA: Diagnosis not present

## 2017-08-25 DIAGNOSIS — Q381 Ankyloglossia: Secondary | ICD-10-CM | POA: Diagnosis not present

## 2017-08-26 DIAGNOSIS — Q381 Ankyloglossia: Secondary | ICD-10-CM | POA: Diagnosis not present

## 2017-08-27 DIAGNOSIS — Q381 Ankyloglossia: Secondary | ICD-10-CM | POA: Diagnosis not present

## 2017-08-28 ENCOUNTER — Encounter (HOSPITAL_COMMUNITY): Payer: Self-pay | Admitting: Emergency Medicine

## 2017-08-28 ENCOUNTER — Ambulatory Visit: Payer: Self-pay | Admitting: Internal Medicine

## 2017-08-28 ENCOUNTER — Emergency Department (HOSPITAL_COMMUNITY)
Admission: EM | Admit: 2017-08-28 | Discharge: 2017-08-28 | Disposition: A | Discharge status: Home / Self Care | Payer: Medicare HMO | Attending: Emergency Medicine | Admitting: Emergency Medicine

## 2017-08-28 DIAGNOSIS — I110 Hypertensive heart disease with heart failure: Secondary | ICD-10-CM | POA: Insufficient documentation

## 2017-08-28 DIAGNOSIS — R072 Precordial pain: Secondary | ICD-10-CM

## 2017-08-28 DIAGNOSIS — E119 Type 2 diabetes mellitus without complications: Secondary | ICD-10-CM | POA: Diagnosis not present

## 2017-08-28 DIAGNOSIS — F17210 Nicotine dependence, cigarettes, uncomplicated: Secondary | ICD-10-CM | POA: Insufficient documentation

## 2017-08-28 DIAGNOSIS — R079 Chest pain, unspecified: Secondary | ICD-10-CM | POA: Insufficient documentation

## 2017-08-28 DIAGNOSIS — R0602 Shortness of breath: Secondary | ICD-10-CM | POA: Diagnosis not present

## 2017-08-28 DIAGNOSIS — Z79899 Other long term (current) drug therapy: Secondary | ICD-10-CM | POA: Insufficient documentation

## 2017-08-28 DIAGNOSIS — J449 Chronic obstructive pulmonary disease, unspecified: Secondary | ICD-10-CM | POA: Diagnosis not present

## 2017-08-28 DIAGNOSIS — Q381 Ankyloglossia: Secondary | ICD-10-CM | POA: Diagnosis not present

## 2017-08-28 DIAGNOSIS — I509 Heart failure, unspecified: Secondary | ICD-10-CM | POA: Insufficient documentation

## 2017-08-28 LAB — COMPREHENSIVE METABOLIC PANEL
ALBUMIN: 3.8 g/dL (ref 3.5–5.0)
ALT: 86 U/L — ABNORMAL HIGH (ref 17–63)
ANION GAP: 13 (ref 5–15)
AST: 60 U/L — ABNORMAL HIGH (ref 15–41)
Alkaline Phosphatase: 69 U/L (ref 38–126)
BUN: <5 mg/dL — ABNORMAL LOW (ref 6–20)
CHLORIDE: 108 mmol/L (ref 101–111)
CO2: 19 mmol/L — AB (ref 22–32)
Calcium: 8.9 mg/dL (ref 8.9–10.3)
Creatinine, Ser: 0.73 mg/dL (ref 0.61–1.24)
GFR calc non Af Amer: >60 mL/min (ref >60)
GLUCOSE: 91 mg/dL (ref 65–99)
POTASSIUM: 3.6 mmol/L (ref 3.5–5.1)
SODIUM: 140 mmol/L (ref 135–145)
Total Bilirubin: 0.9 mg/dL (ref 0.3–1.2)
Total Protein: 7.2 g/dL (ref 6.5–8.1)

## 2017-08-28 LAB — CBC
HCT: 47 % (ref 39.0–52.0)
Hemoglobin: 16.4 g/dL (ref 13.0–17.0)
MCH: 31.8 pg (ref 26.0–34.0)
MCHC: 34.9 g/dL (ref 30.0–36.0)
MCV: 91.1 fL (ref 78.0–100.0)
PLATELETS: 117 10*3/uL — AB (ref 150–400)
RBC: 5.16 MIL/uL (ref 4.22–5.81)
RDW: 15.5 % (ref 11.5–15.5)
WBC: 5.9 10*3/uL (ref 4.0–10.5)

## 2017-08-28 LAB — TROPONIN I: Troponin I: <0.03 ng/mL (ref <0.03)

## 2017-08-28 LAB — CBG MONITORING, ED: GLUCOSE-CAPILLARY: 87 mg/dL (ref 65–99)

## 2017-08-28 NOTE — ED Notes (Signed)
Pt walked out to the Lobby. IV was removed.

## 2017-08-28 NOTE — ED Notes (Signed)
No response when called for vitals re-check at 16:38.

## 2017-08-28 NOTE — ED Notes (Addendum)
No response again for vitals recheck at 17:48. Please move OTF.

## 2017-08-28 NOTE — ED Triage Notes (Signed)
Per EMS: Pt c/o of CP and SOB w/ EMS. At the scene Pt was only c/o of SOB, Pt did not c/o CP until Pt arrived at the bridge and was told he would be going to triage. Pt wanted the EMS to wait to transport until he could finish his 40 and cigarette. Pt c/o of sharp central CP. NSR EKG

## 2017-08-28 NOTE — ED Notes (Signed)
While this EMT was in the room getting pt ready for nurse and doctor, pt was recording on his phone everything that was going on. Pt wanting to speak with nurse and doctor

## 2017-08-28 NOTE — ED Provider Notes (Signed)
MSE was initiated and I personally evaluated the patient and placed orders (if any) at  2:32 PM on August 28, 2017.  The patient appears stable so that the remainder of the MSE may be completed by another provider.   EKG Interpretation  Date/Time:  Monday August 28 2017 14:12:57 EST Ventricular Rate:  100 PR Interval:    QRS Duration: 98 QT Interval:  370 QTC Calculation: 478 R Axis:   -59 Text Interpretation:  Sinus tachycardia Probable left atrial enlargement Inferior infarct, old No significant change was found Confirmed by Jola Schmidt [phone]) on 08/28/2017 2:31:49 PM         Jola Schmidt, MD 08/28/17 1432

## 2017-08-29 ENCOUNTER — Other Ambulatory Visit: Payer: Self-pay | Admitting: Internal Medicine

## 2017-08-29 DIAGNOSIS — K279 Peptic ulcer, site unspecified, unspecified as acute or chronic, without hemorrhage or perforation: Secondary | ICD-10-CM

## 2017-08-29 DIAGNOSIS — Q381 Ankyloglossia: Secondary | ICD-10-CM | POA: Diagnosis not present

## 2017-08-29 DIAGNOSIS — Z7251 High risk heterosexual behavior: Secondary | ICD-10-CM

## 2017-08-29 DIAGNOSIS — G4730 Sleep apnea, unspecified: Secondary | ICD-10-CM

## 2017-08-29 DIAGNOSIS — G4700 Insomnia, unspecified: Secondary | ICD-10-CM

## 2017-08-29 DIAGNOSIS — J301 Allergic rhinitis due to pollen: Secondary | ICD-10-CM
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
## 2017-08-30 DIAGNOSIS — Q381 Ankyloglossia: Secondary | ICD-10-CM | POA: Diagnosis not present

## 2017-08-31 DIAGNOSIS — Q381 Ankyloglossia: Secondary | ICD-10-CM | POA: Diagnosis not present

## 2017-09-01 DIAGNOSIS — Q381 Ankyloglossia: Secondary | ICD-10-CM | POA: Diagnosis not present

## 2017-09-02 DIAGNOSIS — Q381 Ankyloglossia: Secondary | ICD-10-CM | POA: Diagnosis not present

## 2017-09-04 DIAGNOSIS — Q381 Ankyloglossia: Secondary | ICD-10-CM | POA: Diagnosis not present

## 2017-09-05 DIAGNOSIS — Q381 Ankyloglossia: Secondary | ICD-10-CM | POA: Diagnosis not present

## 2017-09-06 DIAGNOSIS — Q381 Ankyloglossia: Secondary | ICD-10-CM | POA: Diagnosis not present

## 2017-09-07 DIAGNOSIS — Q381 Ankyloglossia: Secondary | ICD-10-CM | POA: Diagnosis not present

## 2017-09-08 DIAGNOSIS — Q381 Ankyloglossia: Secondary | ICD-10-CM | POA: Diagnosis not present

## 2017-09-09 DIAGNOSIS — Q381 Ankyloglossia: Secondary | ICD-10-CM | POA: Diagnosis not present

## 2017-09-10 DIAGNOSIS — Q381 Ankyloglossia: Secondary | ICD-10-CM | POA: Diagnosis not present

## 2017-09-11 DIAGNOSIS — Q381 Ankyloglossia: Secondary | ICD-10-CM | POA: Diagnosis not present

## 2017-09-12 DIAGNOSIS — Q381 Ankyloglossia: Secondary | ICD-10-CM | POA: Diagnosis not present

## 2017-09-13 ENCOUNTER — Other Ambulatory Visit: Payer: Self-pay | Admitting: Internal Medicine

## 2017-09-13 DIAGNOSIS — Q381 Ankyloglossia: Secondary | ICD-10-CM | POA: Diagnosis not present

## 2017-09-13 DIAGNOSIS — J301 Allergic rhinitis due to pollen: Secondary | ICD-10-CM

## 2017-09-13 DIAGNOSIS — G4700 Insomnia, unspecified: Secondary | ICD-10-CM

## 2017-09-13 DIAGNOSIS — K279 Peptic ulcer, site unspecified, unspecified as acute or chronic, without hemorrhage or perforation: Secondary | ICD-10-CM

## 2017-09-13 DIAGNOSIS — G4730 Sleep apnea, unspecified: Principal | ICD-10-CM

## 2017-09-13 DIAGNOSIS — Z7251 High risk heterosexual behavior: Secondary | ICD-10-CM

## 2017-09-14 DIAGNOSIS — Q381 Ankyloglossia: Secondary | ICD-10-CM | POA: Diagnosis not present

## 2017-09-15 DIAGNOSIS — Q381 Ankyloglossia: Secondary | ICD-10-CM | POA: Diagnosis not present

## 2017-09-16 DIAGNOSIS — Q381 Ankyloglossia: Secondary | ICD-10-CM | POA: Diagnosis not present

## 2017-09-17 DIAGNOSIS — Q381 Ankyloglossia: Secondary | ICD-10-CM | POA: Diagnosis not present

## 2017-09-18 DIAGNOSIS — Q381 Ankyloglossia: Secondary | ICD-10-CM | POA: Diagnosis not present

## 2017-09-19 DIAGNOSIS — Q381 Ankyloglossia: Secondary | ICD-10-CM | POA: Diagnosis not present

## 2017-09-20 DIAGNOSIS — Q381 Ankyloglossia: Secondary | ICD-10-CM | POA: Diagnosis not present

## 2017-09-21 DIAGNOSIS — Q381 Ankyloglossia: Secondary | ICD-10-CM | POA: Diagnosis not present

## 2017-09-22 ENCOUNTER — Other Ambulatory Visit: Payer: Self-pay | Admitting: Internal Medicine

## 2017-09-22 DIAGNOSIS — Q381 Ankyloglossia: Secondary | ICD-10-CM | POA: Diagnosis not present

## 2017-09-22 DIAGNOSIS — G4730 Sleep apnea, unspecified: Principal | ICD-10-CM

## 2017-09-22 DIAGNOSIS — K279 Peptic ulcer, site unspecified, unspecified as acute or chronic, without hemorrhage or perforation: Secondary | ICD-10-CM

## 2017-09-22 DIAGNOSIS — J301 Allergic rhinitis due to pollen: Secondary | ICD-10-CM

## 2017-09-22 DIAGNOSIS — Z7251 High risk heterosexual behavior: Secondary | ICD-10-CM

## 2017-09-22 DIAGNOSIS — G4700 Insomnia, unspecified: Secondary | ICD-10-CM

## 2017-09-23 DIAGNOSIS — Q381 Ankyloglossia: Secondary | ICD-10-CM | POA: Diagnosis not present

## 2017-09-24 DIAGNOSIS — Q381 Ankyloglossia: Secondary | ICD-10-CM | POA: Diagnosis not present

## 2017-09-25 ENCOUNTER — Other Ambulatory Visit: Payer: Self-pay | Admitting: Internal Medicine

## 2017-09-25 DIAGNOSIS — K279 Peptic ulcer, site unspecified, unspecified as acute or chronic, without hemorrhage or perforation: Secondary | ICD-10-CM

## 2017-09-25 DIAGNOSIS — J301 Allergic rhinitis due to pollen: Secondary | ICD-10-CM

## 2017-09-25 DIAGNOSIS — Q381 Ankyloglossia: Secondary | ICD-10-CM | POA: Diagnosis not present

## 2017-09-25 DIAGNOSIS — G4730 Sleep apnea, unspecified: Secondary | ICD-10-CM

## 2017-09-25 DIAGNOSIS — G4700 Insomnia, unspecified: Secondary | ICD-10-CM

## 2017-09-25 DIAGNOSIS — Z7251 High risk heterosexual behavior: Secondary | ICD-10-CM

## 2017-09-25 DIAGNOSIS — J449 Chronic obstructive pulmonary disease, unspecified: Secondary | ICD-10-CM

## 2017-09-25 NOTE — Telephone Encounter (Signed)
Pt is scheduled to come in on Tuesday, 10/03/16.

## 2017-09-25 NOTE — Telephone Encounter (Signed)
Will fill at the appt.

## 2017-09-26 DIAGNOSIS — Q381 Ankyloglossia: Secondary | ICD-10-CM | POA: Diagnosis not present

## 2017-09-27 DIAGNOSIS — Q381 Ankyloglossia: Secondary | ICD-10-CM | POA: Diagnosis not present

## 2017-09-28 DIAGNOSIS — H5203 Hypermetropia, bilateral: Secondary | ICD-10-CM | POA: Diagnosis not present

## 2017-09-28 DIAGNOSIS — H52209 Unspecified astigmatism, unspecified eye: Secondary | ICD-10-CM | POA: Diagnosis not present

## 2017-09-28 DIAGNOSIS — Q381 Ankyloglossia: Secondary | ICD-10-CM | POA: Diagnosis not present

## 2017-09-28 DIAGNOSIS — H524 Presbyopia: Secondary | ICD-10-CM | POA: Diagnosis not present

## 2017-09-29 DIAGNOSIS — Q381 Ankyloglossia: Secondary | ICD-10-CM | POA: Diagnosis not present

## 2017-09-30 DIAGNOSIS — Q381 Ankyloglossia: Secondary | ICD-10-CM | POA: Diagnosis not present

## 2017-10-01 DIAGNOSIS — Q381 Ankyloglossia: Secondary | ICD-10-CM | POA: Diagnosis not present

## 2017-10-02 DIAGNOSIS — Q381 Ankyloglossia: Secondary | ICD-10-CM | POA: Diagnosis not present

## 2017-10-03 ENCOUNTER — Ambulatory Visit (INDEPENDENT_AMBULATORY_CARE_PROVIDER_SITE_OTHER): Payer: Medicare HMO | Admitting: Internal Medicine

## 2017-10-03 ENCOUNTER — Encounter: Payer: Self-pay | Admitting: Internal Medicine

## 2017-10-03 VITALS — BP 130/78 | HR 81 | Temp 97.8°F | Resp 16 | Ht 72.0 in | Wt 201.0 lb

## 2017-10-03 DIAGNOSIS — E785 Hyperlipidemia, unspecified: Secondary | ICD-10-CM

## 2017-10-03 DIAGNOSIS — F200 Paranoid schizophrenia: Secondary | ICD-10-CM

## 2017-10-03 DIAGNOSIS — I10 Essential (primary) hypertension: Secondary | ICD-10-CM

## 2017-10-03 DIAGNOSIS — R7989 Other specified abnormal findings of blood chemistry: Secondary | ICD-10-CM

## 2017-10-03 DIAGNOSIS — Z7251 High risk heterosexual behavior: Secondary | ICD-10-CM

## 2017-10-03 DIAGNOSIS — J449 Chronic obstructive pulmonary disease, unspecified: Secondary | ICD-10-CM | POA: Diagnosis not present

## 2017-10-03 DIAGNOSIS — G4700 Insomnia, unspecified: Secondary | ICD-10-CM

## 2017-10-03 DIAGNOSIS — A530 Latent syphilis, unspecified as early or late: Secondary | ICD-10-CM | POA: Diagnosis not present

## 2017-10-03 DIAGNOSIS — Z7252 High risk homosexual behavior: Secondary | ICD-10-CM

## 2017-10-03 DIAGNOSIS — Z23 Encounter for immunization: Secondary | ICD-10-CM | POA: Diagnosis not present

## 2017-10-03 DIAGNOSIS — K279 Peptic ulcer, site unspecified, unspecified as acute or chronic, without hemorrhage or perforation: Secondary | ICD-10-CM

## 2017-10-03 DIAGNOSIS — R945 Abnormal results of liver function studies: Secondary | ICD-10-CM

## 2017-10-03 DIAGNOSIS — G4730 Sleep apnea, unspecified: Secondary | ICD-10-CM

## 2017-10-03 DIAGNOSIS — Q381 Ankyloglossia: Secondary | ICD-10-CM | POA: Diagnosis not present

## 2017-10-03 MED ORDER — UMECLIDINIUM-VILANTEROL 62.5-25 MCG/INH IN AEPB: 1.0000 | INHALATION_SPRAY | Freq: Every day | RESPIRATORY_TRACT | 1 refills | Status: DC

## 2017-10-03 MED ORDER — QUETIAPINE FUMARATE 200 MG PO TABS: 200.0000 mg | ORAL_TABLET | Freq: Two times a day (BID) | ORAL | 1 refills | Status: DC

## 2017-10-03 MED ORDER — ESOMEPRAZOLE MAGNESIUM 40 MG PO CPDR: 40.0000 mg | DELAYED_RELEASE_CAPSULE | Freq: Every morning | ORAL | 1 refills | Status: DC

## 2017-10-03 MED ORDER — ESZOPICLONE 3 MG PO TABS: ORAL_TABLET | ORAL | 2 refills | Status: DC

## 2017-10-03 NOTE — Patient Instructions (Signed)

## 2017-10-03 NOTE — Progress Notes (Signed)
Subjective:  Patient ID: [name], male    DOB: 10-20-1960  Age: 57 y.o. MRN: [id number]  CC: Hypertension; Hyperlipidemia; and COPD   HPI Jymir IRVINE GLORIOSO presents for f/up - he feels better than usual today and offers no new complaints.  He complains of rare wheezing.  He has abstained from alcohol intake for 30 days.  He wants to continue taking Truvada for HIV prophylaxis.  He has not been sexually active recently but does anticipate being sexually active in the future.  Outpatient Medications Prior to Visit  Medication Sig Dispense Refill  . carvedilol (COREG) 12.5 MG tablet Take 1 tablet (12.5 mg total) by mouth 2 (two) times daily with a meal. 180 tablet 1  . cetirizine (ZYRTEC) 10 MG tablet Take 1 tablet (10 mg total) by mouth daily. (Patient taking differently: Take 10 mg daily as needed by mouth for allergies. ) 90 tablet 1  . losartan (COZAAR) 25 MG tablet Take 1 tablet (25 mg total) daily by mouth. 30 tablet 0  . pravastatin (PRAVACHOL) 40 MG tablet Take 1 tablet (40 mg total) by mouth daily. 90 tablet 1  . thiamine 100 MG tablet Take 1 tablet (100 mg total) daily by mouth. 30 tablet 0  . traZODone (DESYREL) 100 MG tablet TAKE THREE TABLETS BY MOUTH AT BEDTIME 270 tablet 1  . VENTOLIN HFA 108 (90 Base) MCG/ACT inhaler Inhale 1-2 puffs into the lungs every 6 (six) hours as needed for wheezing or shortness of breath. 18 g 5  . Vilazodone HCl (VIIBRYD) 40 MG TABS Take 1 tablet (40 mg total) by mouth daily. 90 tablet 3  . esomeprazole (NEXIUM) 40 MG capsule Take 1 capsule (40 mg total) by mouth every morning. 30 capsule 5  . Eszopiclone 3 MG TABS TAKE ONE TABLET BY MOUTH AT BEDTIME. Take immediately BEFORE bedtime 30 tablet 2  . QUEtiapine (SEROQUEL) 200 MG tablet Take 200 mg by mouth 2 (two) times daily.  0  . TRUVADA 200-300 MG tablet Take 1 tablet by mouth daily. 30 tablet 1  . umeclidinium-vilanterol (ANORO ELLIPTA) 62.5-25 MCG/INH AEPB Inhale 1 puff into the lungs daily. 30  each 11  . nicotine (NICODERM CQ - DOSED IN MG/24 HOURS) 21 mg/24hr patch Place 1 patch (21 mg total) daily onto the skin. 28 patch 0  . predniSONE (DELTASONE) 10 MG tablet Take 30mg  daily for 1days,Take 20mg  daily for 1days,Take 10mg  daily for 1days, then stop. 6 tablet 0  . QUEtiapine (SEROQUEL) 100 MG tablet TAKE ONE TABLET BY MOUTH AT NIGHT 90 tablet 1   No facility-administered medications prior to visit.     ROS Review of Systems  Constitutional: Negative for chills, fatigue and fever.  HENT: Negative.  Negative for sore throat and trouble swallowing.   Eyes: Negative for visual disturbance.  Respiratory: Positive for wheezing. Negative for cough, chest tightness and shortness of breath.   Cardiovascular: Negative for chest pain, palpitations and leg swelling.  Gastrointestinal: Negative for abdominal pain, diarrhea, nausea and vomiting.  Endocrine: Negative.   Genitourinary: Negative.  Negative for dysuria, genital sores, penile swelling, scrotal swelling and testicular pain.  Musculoskeletal: Negative for back pain, myalgias and neck pain.  Skin: Negative for color change and rash.  Allergic/Immunologic: Negative.   Neurological: Negative.  Negative for headaches.  Hematological: Negative for adenopathy. Does not bruise/bleed easily.  Psychiatric/Behavioral: Positive for sleep disturbance. Negative for agitation, behavioral problems, dysphoric mood, hallucinations and suicidal ideas. The patient is not nervous/anxious.  Objective:  BP 130/78 (BP Location: Left Arm, Patient Position: Sitting, Cuff Size: Normal)   Pulse 81   Temp 97.8 F (36.6 C) (Oral)   Resp 16   Ht 6' (1.829 m)   Wt 201 lb (91.2 kg)   SpO2 96%   BMI 27.26 kg/m   BP Readings from Last 3 Encounters:  10/03/17 130/78  08/28/17 130/78  08/04/17 (!) 154/98    Wt Readings from Last 3 Encounters:  10/03/17 201 lb (91.2 kg)  08/28/17 190 lb (86.2 kg)  08/04/17 190 lb 8 oz (86.4 kg)    Physical  Exam  Constitutional: He is oriented to person, place, and time. No distress.  HENT:  Mouth/Throat: Oropharynx is clear and moist. No oropharyngeal exudate.  Eyes: Conjunctivae are normal. Left eye exhibits no discharge. No scleral icterus.  Neck: Normal range of motion. Neck supple. No JVD present. No thyromegaly present.  Cardiovascular: Normal rate, regular rhythm and normal heart sounds.  No murmur heard. Pulmonary/Chest: Effort normal and breath sounds normal. No respiratory distress. He has no wheezes. He has no rales.  Abdominal: Soft. Bowel sounds are normal. He exhibits no mass. There is no hepatosplenomegaly. There is no tenderness. There is no CVA tenderness.  Musculoskeletal: Normal range of motion. He exhibits no edema or tenderness.  Lymphadenopathy:    He has no cervical adenopathy.  Neurological: He is alert and oriented to person, place, and time.  Skin: Skin is warm and dry. No rash noted. He is not diaphoretic. No erythema. No pallor.  Psychiatric: He has a normal mood and affect. His behavior is normal. Judgment and thought content normal.  Vitals reviewed.   Lab Results  Component Value Date   WBC 5.9 08/28/2017   HGB 16.4 08/28/2017   HCT 47.0 08/28/2017   PLT 117 (L) 08/28/2017   GLUCOSE 106 (H) 10/04/2017   CHOL 142 10/04/2017   TRIG 129.0 10/04/2017   HDL 31.90 (L) 10/04/2017   LDLDIRECT 122.0 11/30/2016   LDLCALC 84 10/04/2017   ALT 40 10/04/2017   AST 27 10/04/2017   NA 141 10/04/2017   K 3.9 10/04/2017   CL 108 10/04/2017   CREATININE 0.82 10/04/2017   BUN 6 10/04/2017   CO2 25 10/04/2017   TSH 0.56 11/30/2016   PSA 0.60 11/30/2016   INR 1.03 08/26/2015   HGBA1C 6.2 11/30/2016   MICROALBUR 0.2 10/08/2008    No results found.  Assessment & Plan:   Hamp was seen today for hypertension, hyperlipidemia and copd.  Diagnoses and all orders for this visit:  High risk homosexual behavior- Screening for HIV and hep B infections are negative.   His renal function is normal.  In addition to safe sexual practices he will continue taking Truvada for HIV prevention. -     Basic metabolic panel; Future -     HIV antibody; Future -     Cancel: RPR; Future  LFT elevation- He has quit drinking and his LFTs are normal. -     Hepatitis B surface antigen; Future -     Hepatic function panel; Future  Need for influenza vaccination -     Flu Vaccine QUAD 36+ mos IM  COPD GOLD 0 copd - His symptoms are adequately well controlled with the LABA/LAMA combination inhaler. -     umeclidinium-vilanterol (ANORO ELLIPTA) 62.5-25 MCG/INH AEPB; Inhale 1 puff into the lungs daily.  HYPERTENSION, BENIGN- His blood pressure is adequately well controlled.  Paranoid schizophrenia ([place])- He is maintaining  a normal thought process on this dose of Seroquel.  Will continue. -     QUEtiapine (SEROQUEL) 200 MG tablet; Take 1 tablet (200 mg total) by mouth 2 (two) times daily.  Syphili, latent- His RPR is positive again.  Will discuss treatment options upon his follow-up. -     RPR; Future  Insomnia w/ sleep apnea -     Eszopiclone 3 MG TABS; TAKE ONE TABLET BY MOUTH AT BEDTIME. Take immediately BEFORE bedtime  PUD (peptic ulcer disease) -     esomeprazole (NEXIUM) 40 MG capsule; Take 1 capsule (40 mg total) by mouth every morning.  Hyperlipidemia with target LDL less than 130- He has achieved his LDL goal is doing well on the statin. -     Lipid panel; Future  High risk sexual behavior -     emtricitabine-tenofovir (TRUVADA) 200-300 MG tablet; Take 1 tablet by mouth daily.   I have discontinued Golden T. Mella's QUEtiapine, nicotine, and predniSONE. I have changed his TRUVADA to emtricitabine-tenofovir. I have also changed his QUEtiapine. Additionally, I am having him maintain his cetirizine, Vilazodone HCl, traZODone, VENTOLIN HFA, carvedilol, losartan, thiamine, pravastatin, umeclidinium-vilanterol, Eszopiclone, and esomeprazole.  Meds ordered this  encounter  Medications  . umeclidinium-vilanterol (ANORO ELLIPTA) 62.5-25 MCG/INH AEPB    Sig: Inhale 1 puff into the lungs daily.    Dispense:  90 each    Refill:  1  . QUEtiapine (SEROQUEL) 200 MG tablet    Sig: Take 1 tablet (200 mg total) by mouth 2 (two) times daily.    Dispense:  180 tablet    Refill:  1  . Eszopiclone 3 MG TABS    Sig: TAKE ONE TABLET BY MOUTH AT BEDTIME. Take immediately BEFORE bedtime    Dispense:  30 tablet    Refill:  2  . esomeprazole (NEXIUM) 40 MG capsule    Sig: Take 1 capsule (40 mg total) by mouth every morning.    Dispense:  90 capsule    Refill:  1  . emtricitabine-tenofovir (TRUVADA) 200-300 MG tablet    Sig: Take 1 tablet by mouth daily.    Dispense:  90 tablet    Refill:  1     Follow-up: Return in about 4 months (around 01/31/2018).  Scarlette Calico, MD

## 2017-10-04 ENCOUNTER — Other Ambulatory Visit (INDEPENDENT_AMBULATORY_CARE_PROVIDER_SITE_OTHER): Payer: Medicare HMO

## 2017-10-04 DIAGNOSIS — Z7252 High risk homosexual behavior: Secondary | ICD-10-CM

## 2017-10-04 DIAGNOSIS — E785 Hyperlipidemia, unspecified: Secondary | ICD-10-CM

## 2017-10-04 DIAGNOSIS — A530 Latent syphilis, unspecified as early or late: Secondary | ICD-10-CM

## 2017-10-04 DIAGNOSIS — Q381 Ankyloglossia: Secondary | ICD-10-CM | POA: Diagnosis not present

## 2017-10-04 DIAGNOSIS — R945 Abnormal results of liver function studies: Secondary | ICD-10-CM

## 2017-10-04 DIAGNOSIS — R7989 Other specified abnormal findings of blood chemistry: Secondary | ICD-10-CM

## 2017-10-04 LAB — LIPID PANEL
CHOL/HDL RATIO: 4
Cholesterol: 142 mg/dL (ref 0–200)
HDL: 31.9 mg/dL — AB (ref >39.00)
LDL Cholesterol: 84 mg/dL (ref 0–99)
NONHDL: 110.28
Triglycerides: 129 mg/dL (ref 0.0–149.0)
VLDL: 25.8 mg/dL (ref 0.0–40.0)

## 2017-10-04 LAB — BASIC METABOLIC PANEL
BUN: 6 mg/dL (ref 6–23)
CALCIUM: 9.6 mg/dL (ref 8.4–10.5)
CHLORIDE: 108 meq/L (ref 96–112)
CO2: 25 meq/L (ref 19–32)
CREATININE: 0.82 mg/dL (ref 0.40–1.50)
GFR: 124.24 mL/min (ref >60.00)
GLUCOSE: 106 mg/dL — AB (ref 70–99)
Potassium: 3.9 mEq/L (ref 3.5–5.1)
Sodium: 141 mEq/L (ref 135–145)

## 2017-10-04 LAB — HEPATIC FUNCTION PANEL
ALK PHOS: 78 U/L (ref 39–117)
ALT: 40 U/L (ref 0–53)
AST: 27 U/L (ref 0–37)
Albumin: 4.2 g/dL (ref 3.5–5.2)
BILIRUBIN DIRECT: 0.2 mg/dL (ref 0.0–0.3)
BILIRUBIN TOTAL: 0.8 mg/dL (ref 0.2–1.2)
Total Protein: 7.5 g/dL (ref 6.0–8.3)

## 2017-10-05 ENCOUNTER — Encounter: Payer: Self-pay | Admitting: Internal Medicine

## 2017-10-05 DIAGNOSIS — Q381 Ankyloglossia: Secondary | ICD-10-CM | POA: Diagnosis not present

## 2017-10-05 LAB — RPR TITER

## 2017-10-05 LAB — HEPATITIS B SURFACE ANTIGEN: Hepatitis B Surface Ag: NONREACTIVE

## 2017-10-05 LAB — RPR: RPR: REACTIVE — AB

## 2017-10-05 LAB — HIV ANTIBODY (ROUTINE TESTING W REFLEX): HIV: NONREACTIVE

## 2017-10-05 LAB — FLUORESCENT TREPONEMAL AB(FTA)-IGG-BLD: FLUORESCENT TREPONEMAL ABS: REACTIVE — AB

## 2017-10-05 MED ORDER — EMTRICITABINE-TENOFOVIR DF 200-300 MG PO TABS: 1.0000 | ORAL_TABLET | Freq: Every day | ORAL | 1 refills | Status: DC

## 2017-10-06 DIAGNOSIS — Q381 Ankyloglossia: Secondary | ICD-10-CM | POA: Diagnosis not present

## 2017-10-07 DIAGNOSIS — Q381 Ankyloglossia: Secondary | ICD-10-CM | POA: Diagnosis not present

## 2017-10-08 ENCOUNTER — Other Ambulatory Visit: Payer: Self-pay | Admitting: Internal Medicine

## 2017-10-08 DIAGNOSIS — M170 Bilateral primary osteoarthritis of knee: Secondary | ICD-10-CM

## 2017-10-08 DIAGNOSIS — Q381 Ankyloglossia: Secondary | ICD-10-CM | POA: Diagnosis not present

## 2017-10-08 MED ORDER — DICLOFENAC SODIUM 1 % TD GEL: 2.0000 g | Freq: Four times a day (QID) | TRANSDERMAL | 5 refills | Status: DC

## 2017-10-09 ENCOUNTER — Encounter: Payer: Self-pay | Admitting: Internal Medicine

## 2017-10-09 ENCOUNTER — Ambulatory Visit (INDEPENDENT_AMBULATORY_CARE_PROVIDER_SITE_OTHER): Payer: Medicare HMO | Admitting: Internal Medicine

## 2017-10-09 ENCOUNTER — Other Ambulatory Visit: Payer: Self-pay | Admitting: Podiatry

## 2017-10-09 ENCOUNTER — Other Ambulatory Visit: Payer: Self-pay | Admitting: Internal Medicine

## 2017-10-09 VITALS — BP 120/76 | HR 80 | Ht 75.0 in | Wt 204.6 lb

## 2017-10-09 DIAGNOSIS — G4733 Obstructive sleep apnea (adult) (pediatric): Secondary | ICD-10-CM | POA: Diagnosis not present

## 2017-10-09 DIAGNOSIS — Q381 Ankyloglossia: Secondary | ICD-10-CM | POA: Diagnosis not present

## 2017-10-09 DIAGNOSIS — J301 Allergic rhinitis due to pollen: Secondary | ICD-10-CM
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
## 2017-10-09 NOTE — Assessment & Plan Note (Signed)
Tentative diagnosis based on history and exam.  His self description is incomplete.  I cannot exclude coughing or reflux with choking on at least some occasions. Plan-gust possibility of obstructive sleep apnea and/or scheduling sleep study.  We did discuss the possibility of CPAP.  If respiratory arousal is from other mechanisms, it may respond to management of COPD by Dr. Melvyn Novas.

## 2017-10-09 NOTE — Patient Instructions (Signed)
Order- schedule unattended home sleep test     Dx OSA  Please call me about 2 weeks after your sleep test for results and recommendations. If appropriate, we may be able to start treatment before I see you again

## 2017-10-10 DIAGNOSIS — Q381 Ankyloglossia: Secondary | ICD-10-CM | POA: Diagnosis not present

## 2017-10-11 DIAGNOSIS — Q381 Ankyloglossia: Secondary | ICD-10-CM | POA: Diagnosis not present

## 2017-10-12 ENCOUNTER — Encounter: Payer: Self-pay | Admitting: Internal Medicine

## 2017-10-12 ENCOUNTER — Ambulatory Visit (INDEPENDENT_AMBULATORY_CARE_PROVIDER_SITE_OTHER): Payer: Medicare HMO | Admitting: Internal Medicine

## 2017-10-12 VITALS — BP 118/68 | HR 73 | Ht 75.0 in | Wt 202.0 lb

## 2017-10-12 DIAGNOSIS — J449 Chronic obstructive pulmonary disease, unspecified: Secondary | ICD-10-CM | POA: Diagnosis not present

## 2017-10-12 DIAGNOSIS — Q381 Ankyloglossia: Secondary | ICD-10-CM | POA: Diagnosis not present

## 2017-10-12 DIAGNOSIS — F17210 Nicotine dependence, cigarettes, uncomplicated: Secondary | ICD-10-CM | POA: Diagnosis not present

## 2017-10-12 MED ORDER — MOMETASONE FURO-FORMOTEROL FUM 100-5 MCG/ACT IN AERO: INHALATION_SPRAY | RESPIRATORY_TRACT | 11 refills | Status: DC

## 2017-10-12 NOTE — Progress Notes (Signed)
Subjective:     Patient ID: [name], male   DOB: 1961/09/03,    MRN: [id number]  HPI  57 yobm active smoker with doe since around 2007 referred to pulmonary clinic 10/12/2017 by Dr  Ronnald Ramp for copd eval with prev study 10/2009 c/w GOLD 0 criteria and s/p RULobectomy for Stage I [facility] 09/04/15 s adjuvant - prev seen by Dr Annamaria Boots for ?OSA and here in 2016:  - pft's 10/28/09 FEV1 3.16 (70%) ratio 73 with air trapping and dloc 49% corrects to 69% for alv volume 08/11/2015  extensive coaching HFA effectiveness =    75%  > try off dpi and on dulera 100 2bid   10/12/2017   Pulmonary office visit/ Jadaya Sommerfield / re -establish re sob  Chief Complaint  Patient presents with  . Pulmonary Consult    Last seen 08/11/15 and was referred back by Dr Scarlette Calico. Pt c/o increased SOB for the past 6 months. He uses his albuterol inhaler 3-4 x per day. He also c/o non prod cough.  He states he gets SOB walking approx 20 ft. He also has a non prod cough. He is using   progressive doe x 6 m to point of room to room (note not reproducible here) assoc with dry cough neither symptom improves much with saba up to qid and can't sleep due to choking sensations after asleep for just an hour or so.  No obvious day to day or daytime variability or assoc excess/ purulent sputum or mucus plugs or hemoptysis or cp or chest tightness, subjective wheeze or overt sinus or hb symptoms. No unusual exposure hx or h/o childhood pna/ asthma or knowledge of premature birth.    Also denies any obvious fluctuation of symptoms with weather or environmental changes or other aggravating or alleviating factors except as outlined above   Current Allergies, Complete Past Medical History, Past Surgical History, Family History, and Social History were reviewed in [employer] record.  ROS  The following are not active complaints unless bolded Hoarseness, sore throat, dysphagia, dental problems, itching, sneezing,  nasal congestion  or discharge of excess mucus or purulent secretions, ear ache,   fever, chills, sweats, unintended wt loss or wt gain, classically pleuritic or exertional cp,  orthopnea pnd or leg swelling, presyncope, palpitations, abdominal pain, anorexia, nausea, vomiting, diarrhea  or change in bowel habits or change in bladder habits, change in stools or change in urine, dysuria, hematuria,  rash, arthralgias, visual complaints, headache, numbness, weakness or ataxia or problems with walking or coordination,  change in mood/affect or memory.        Current Meds  Medication Sig  . carvedilol (COREG) 12.5 MG tablet Take 1 tablet (12.5 mg total) by mouth 2 (two) times daily with a meal.  . cetirizine (ZYRTEC) 10 MG tablet Take 1 tablet (10 mg total) by mouth daily.  . ciclopirox (PENLAC) 8 % solution APPLY topically AT BEDTIME. APPLY ALL over NAIL FOLD fold & surrounding SKIN. APPLY daily over prevoius coat, AFTER SEVEN DAYS REMOVE with a  . diclofenac sodium (VOLTAREN) 1 % GEL Apply 2 g topically 4 (four) times daily.  Marland Kitchen emtricitabine-tenofovir (TRUVADA) 200-300 MG tablet Take 1 tablet by mouth daily.  Marland Kitchen esomeprazole (NEXIUM) 40 MG capsule Take 1 capsule (40 mg total) by mouth every morning.  . Eszopiclone 3 MG TABS TAKE ONE TABLET BY MOUTH AT BEDTIME. Take immediately BEFORE bedtime  . losartan (COZAAR) 25 MG tablet Take 1 tablet (25 mg  total) daily by mouth.  . pravastatin (PRAVACHOL) 40 MG tablet Take 1 tablet (40 mg total) by mouth daily.  . QUEtiapine (SEROQUEL) 200 MG tablet Take 1 tablet (200 mg total) by mouth 2 (two) times daily.  . traZODone (DESYREL) 100 MG tablet TAKE THREE TABLETS BY MOUTH AT BEDTIME  . VENTOLIN HFA 108 (90 Base) MCG/ACT inhaler Inhale 1-2 puffs into the lungs every 6 (six) hours as needed for wheezing or shortness of breath.  . Vilazodone HCl (VIIBRYD) 40 MG TABS Take 1 tablet (40 mg total) by mouth daily.  . [DISCONTINUED] umeclidinium-vilanterol (ANORO ELLIPTA) 62.5-25 MCG/INH  AEPB Inhale 1 puff into the lungs daily.        Review of Systems     Objective:   Physical Exam  dentures  Mild copd   amb thin bm abn affect  Wt Readings from Last 3 Encounters:  10/12/17 202 lb (91.6 kg)  10/09/17 204 lb 9.6 oz (92.8 kg)  10/03/17 201 lb (91.2 kg)     Vital signs reviewed - Note on arrival 02 sats  100% on RA     HEENT: nl dentition, turbinates bilaterally, and oropharynx. Nl external ear canals without cough reflex   NECK :  without JVD/Nodes/TM/ nl carotid upstrokes bilaterally   LUNGS: no acc muscle use,  Nl contour chest with slt decreased bs / hyper resonant bilaterally s wheeze    CV:  RRR  no s3 or murmur or increase in P2, and no edema   ABD:  soft and nontender with nl inspiratory excursion in the supine position. No bruits or organomegaly appreciated, bowel sounds nl  MS:  Nl gait/ ext warm without deformities, calf tenderness, cyanosis or clubbing No obvious joint restrictions   SKIN: warm and dry without lesions    NEURO:  alert, approp, nl sensorium with  no motor or cerebellar deficits apparent.     I personally reviewed images and agree with radiology impression as follows:   Chest CT 08/03/17 1. Negative for acute pulmonary embolism, pneumonia or other acute cardiopulmonary process. 2. Stable surgical changes of prior right upper lobectomy without evidence of new or recurrent malignancy. 3. Coronary artery calcifications.  Assessment:

## 2017-10-12 NOTE — Patient Instructions (Addendum)
Plan A = Automatic = stop anoro and restart Dulera 100 Take 2 puffs first thing in am and then another 2 puffs about 12 hours later.   Work on inhaler technique:  relax and gently blow all the way out then take a nice smooth deep breath back in, triggering the inhaler at same time you start breathing in.  Hold for up to 5 seconds if you can. Blow out thru nose. Rinse and gargle with water when done      Plan B = Backup Only use your albuterol (ventolin) as a rescue medication to be used if you can't catch your breath by resting or doing a relaxed purse lip breathing pattern.  - The less you use it, the better it will work when you need it. - Ok to use the inhaler up to 2 puffs  every 4 hours if you must but call for appointment if use goes up over your usual need - Don't leave home without it !!  (think of it like the spare tire for your car)    Please schedule a follow up office visit in 4 weeks, sooner if needed  with all medications /inhalers/ solutions in hand so we can verify exactly what you are taking. This includes all medications from all doctors and over the counters - with PFTs on return

## 2017-10-13 DIAGNOSIS — Q381 Ankyloglossia: Secondary | ICD-10-CM | POA: Diagnosis not present

## 2017-10-14 DIAGNOSIS — Q381 Ankyloglossia: Secondary | ICD-10-CM | POA: Diagnosis not present

## 2017-10-15 DIAGNOSIS — Q381 Ankyloglossia: Secondary | ICD-10-CM | POA: Diagnosis not present

## 2017-10-16 ENCOUNTER — Encounter: Payer: Self-pay | Admitting: Internal Medicine

## 2017-10-16 ENCOUNTER — Telehealth: Payer: Self-pay

## 2017-10-16 DIAGNOSIS — Q381 Ankyloglossia: Secondary | ICD-10-CM | POA: Diagnosis not present

## 2017-10-16 NOTE — Assessment & Plan Note (Signed)
- pft's 10/28/09 FEV1 3.16 (70%) ratio 73 with air trapping and dloc 49% corrects to 69% for alv volume - 10/12/2017   extensive coaching HFA effectiveness =    75% > try off dpi and on dulera 100 2bid - 10/12/2017   Walked RA  2 laps @ 185 ft each stopped due to  Sob , no desats  Symptoms are markedly disproportionate to objective findings and not clear this is actually much of a  lung problem but pt does appear to have difficult to sort out respiratory symptoms of unknown origin for which  DDX  = almost all start with A and  include Adherence, Ace Inhibitors, Acid Reflux, Active Sinus Disease, Alpha 1 Antitripsin deficiency, Anxiety masquerading as Airways dz,  ABPA,  Allergy(esp in young), Aspiration (esp in elderly), Adverse effects of meds,  Active smokers, A bunch of PE's (a small clot burden can't cause this syndrome unless there is already severe underlying pulm or vascular dz with poor reserve) plus two Bs  = Bronchiectasis and Beta blocker use..and one C= CHF     Adherence is always the initial "prime suspect" and is a multilayered concern that requires a "trust but verify" approach in every patient - starting with knowing how to use medications, especially inhalers, correctly, keeping up with refills and understanding the fundamental difference between maintenance and prns vs those medications only taken for a very short course and then stopped and not refilled.  - see hfa teaching - return with all meds in hand using a trust but verify approach to confirm accurate Medication  Reconciliation The principal here is that until we are certain that the  patients are doing what we've asked, it makes no sense to ask them to do more.   Active smoking top of  The list of usual suspects > (see separate a/p)   ? Acid (or non-acid) GERD > always difficult to exclude as up to 75% of pts in some series report no assoc GI/ Heartburn symptoms> rec continue max (24h)  acid suppression and diet restrictions/  reviewed    ? Anxiety/depression/ deconditioning  > usually at the bottom of this list of usual suspects but should be much higher on this pt's based on H and P and note already on psychotropics   ? Allergy/asthma component > try duler 100 2bid as this is certainly not severe copd   ?  Adverse effects of dpi (esp the "choking" described) > try off dpi and on low dose hfa   ? BB effects > In the setting of respiratory symptoms of unknown etiology,  It would be preferable to use bystolic, the most beta -1  selective Beta blocker available in sample form, with bisoprolol the most selective generic choice  on the market, at least on a trial basis, to make sure the spillover Beta 2 effects of the less specific Beta blockers are not contributing to this patient's symptoms.   ? CHF > Echo 08/03/2017 c/w component of cardiac asthma - Left ventricle: The cavity size was mildly dilated. Wall   thickness was normal. Systolic function was moderately reduced.   The estimated ejection fraction was in the range of 35% to 40%.   Moderate diffuse hypokinesis with no identifiable regional   variations > cards f/u Dr C   I had an extended discussion with the patient reviewing all relevant studies completed to date and  lasting 25 minutes of a 40  minute extened office  visit to re  establish    re  severe non-specific but potentially very serious refractory respiratory symptoms of uncertain and potentially multiple  etiologies.   Each maintenance medication was reviewed in detail including most importantly the difference between maintenance and prns and under what circumstances the prns are to be triggered using an action plan format that is not reflected in the computer generated alphabetically organized AVS.    Please see AVS for specific instructions unique to this office visit that I personally wrote and verbalized to the the pt in detail and then reviewed with pt  by my nurse highlighting any changes in  therapy/plan of care  recommended at today's visit.      Next step is return with all meds for new set of pfts

## 2017-10-16 NOTE — Telephone Encounter (Signed)
[name] (Key: [id number])   [employer] has not yet replied to your PA request. You may close this dialog, return to your dashboard, and perform other tasks. To check for an update later, open this request again from your dashboard. If [employer] has not replied to your request within 24-72 hours please contact [employer] at [phone].   Will route to [place] to follow up

## 2017-10-16 NOTE — Assessment & Plan Note (Signed)

## 2017-10-17 ENCOUNTER — Ambulatory Visit: Payer: Self-pay | Admitting: Internal Medicine

## 2017-10-17 DIAGNOSIS — Q381 Ankyloglossia: Secondary | ICD-10-CM | POA: Diagnosis not present

## 2017-10-17 NOTE — Telephone Encounter (Signed)
Spoke with rep at [facility] to provide additional info needed for PA for [facility] .  A determination is expected within the next 72 hours.  Routing back to LR for follow-up.

## 2017-10-17 NOTE — Telephone Encounter (Signed)
Keelen with [place], CB is [phone].  Requesting call back.  Do have questions to complete PA.  BZM#[id number].

## 2017-10-17 NOTE — Telephone Encounter (Signed)
PA approved for [facility]. Pharmacy and patient made aware.

## 2017-10-18 DIAGNOSIS — Q381 Ankyloglossia: Secondary | ICD-10-CM | POA: Diagnosis not present

## 2017-10-19 DIAGNOSIS — Q381 Ankyloglossia: Secondary | ICD-10-CM | POA: Diagnosis not present

## 2017-10-20 DIAGNOSIS — Q381 Ankyloglossia: Secondary | ICD-10-CM | POA: Diagnosis not present

## 2017-10-21 DIAGNOSIS — Q381 Ankyloglossia: Secondary | ICD-10-CM | POA: Diagnosis not present

## 2017-10-22 DIAGNOSIS — Q381 Ankyloglossia: Secondary | ICD-10-CM | POA: Diagnosis not present

## 2017-10-23 DIAGNOSIS — Q381 Ankyloglossia: Secondary | ICD-10-CM | POA: Diagnosis not present

## 2017-10-24 DIAGNOSIS — Q381 Ankyloglossia: Secondary | ICD-10-CM | POA: Diagnosis not present

## 2017-10-25 DIAGNOSIS — Q381 Ankyloglossia: Secondary | ICD-10-CM | POA: Diagnosis not present

## 2017-10-25 IMAGING — CT CT CHEST W/ CM
2 of 3 series · 15 of 36 positions shown, 18 images · IV contrast (iopamidol)
Comparison: 04/13/2016

CLINICAL DATA: Followup right lung non-small-cell carcinoma.
Previous right upper lobectomy. Chronic cough and shortness of
breath. Emphysema.

EXAM:
CT CHEST WITH CONTRAST
TECHNIQUE: Multidetector CT imaging of the chest was performed during
intravenous contrast administration.
CONTRAST:  75mL 8895C2-9SS IOPAMIDOL (8895C2-9SS) INJECTION 61%

[Series 2: axial st · axial · 0.71mm/px · z∈[-327,-51]mm · 12 of 162 slices shown, 15 images]
[im 12/162  mediastinal]
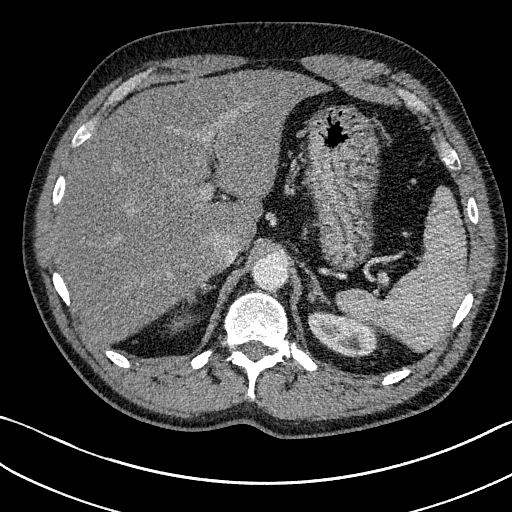
[im 12/162  lung]
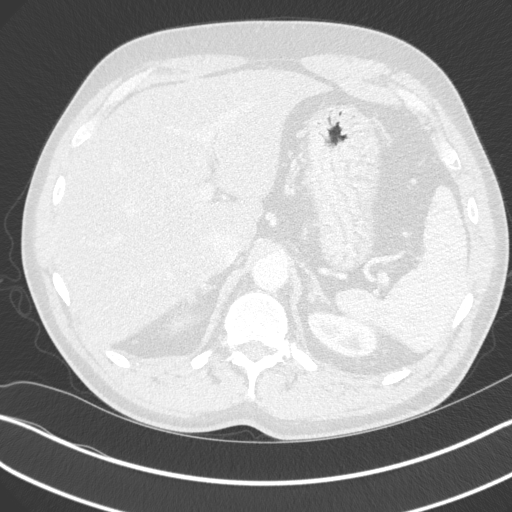
[im 24/162  lung]
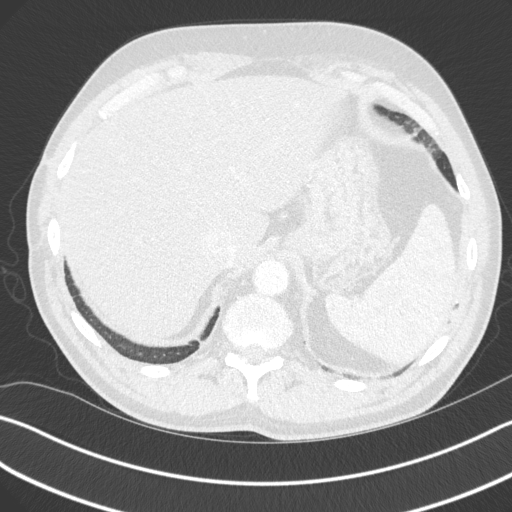
[im 36/162  lung]
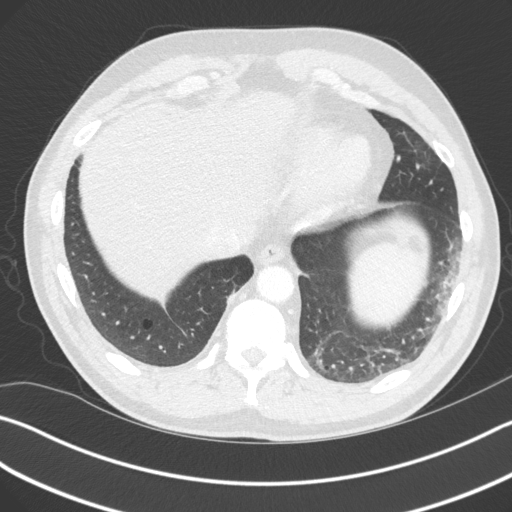
[im 48/162  lung]
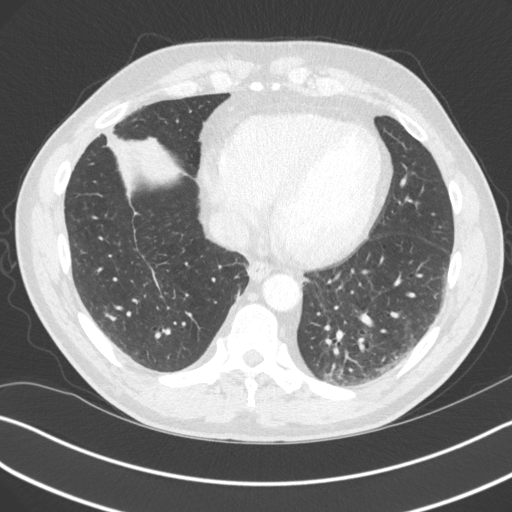
[im 60/162  mediastinal]
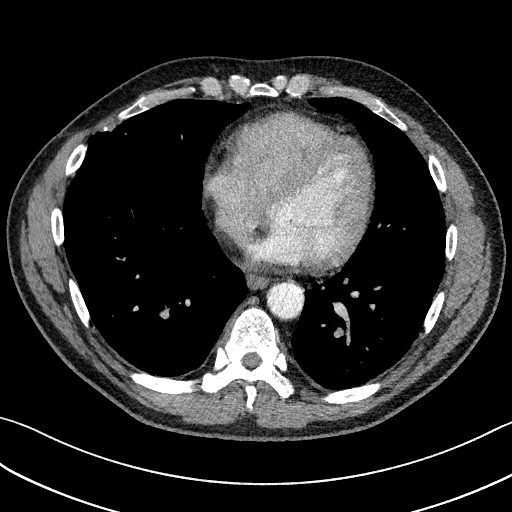
[im 60/162  lung]
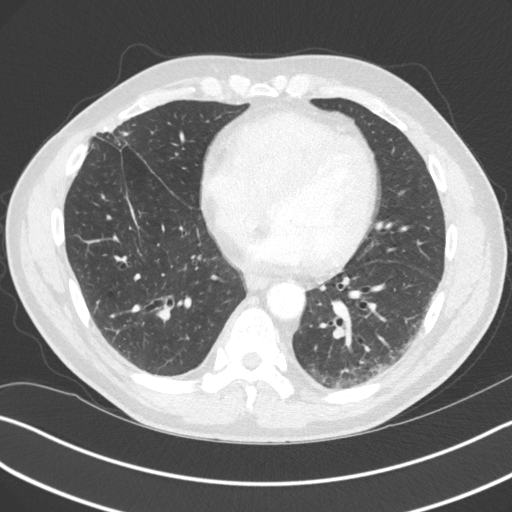
[im 72/162  lung]
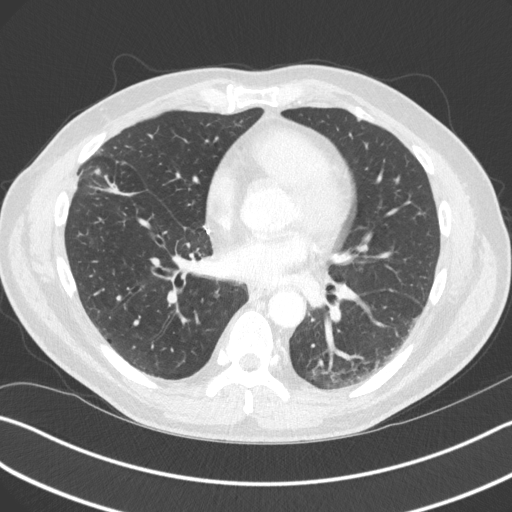
[im 90/162  lung]
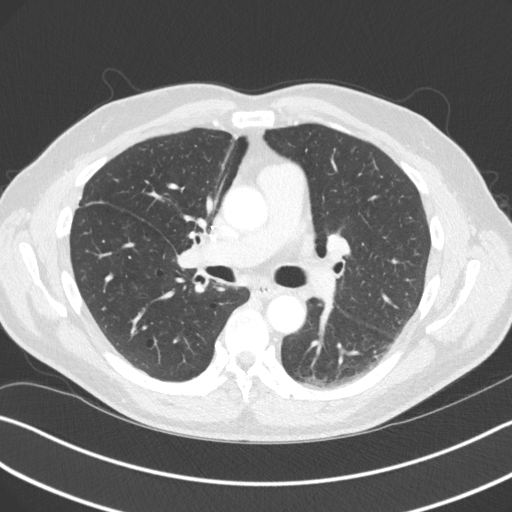
[im 102/162  lung]
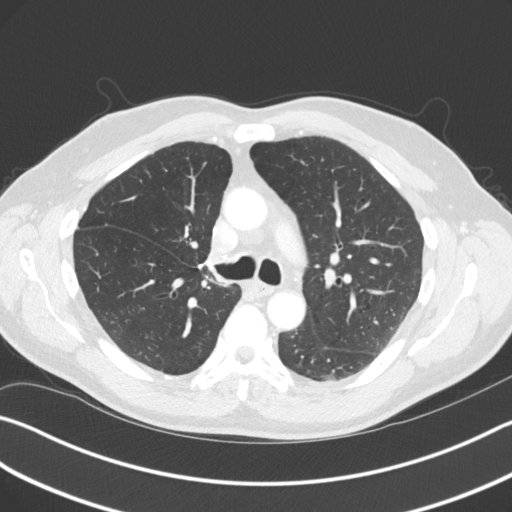
[im 114/162  mediastinal]
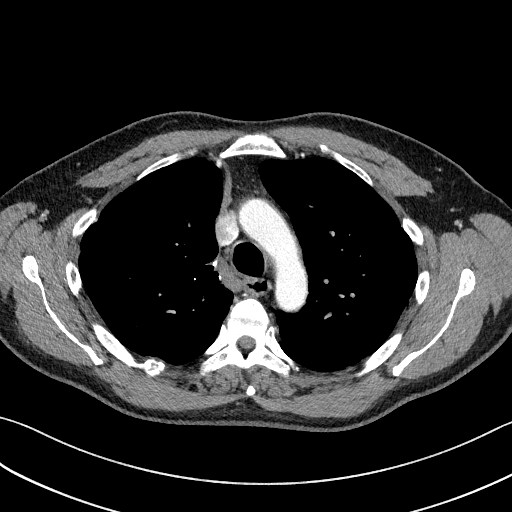
[im 114/162  lung]
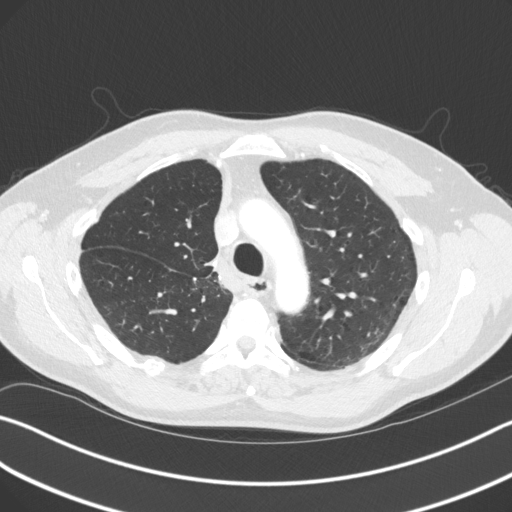
[im 126/162  lung]
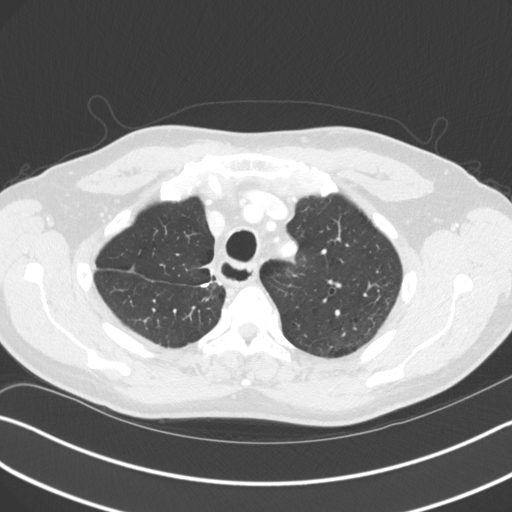
[im 138/162  lung]
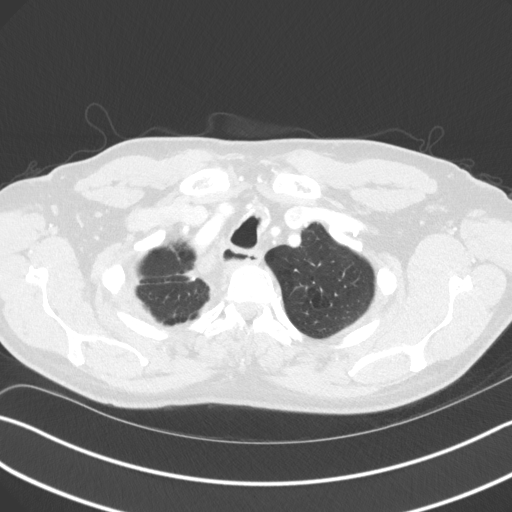
[im 150/162  lung]
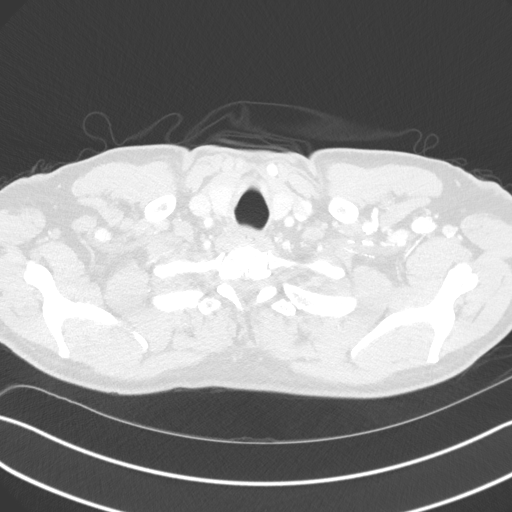

[Series 5: coronal · coronal · 0.65mm/px · 3 of 160 slices shown]
[im 32/160  lung]
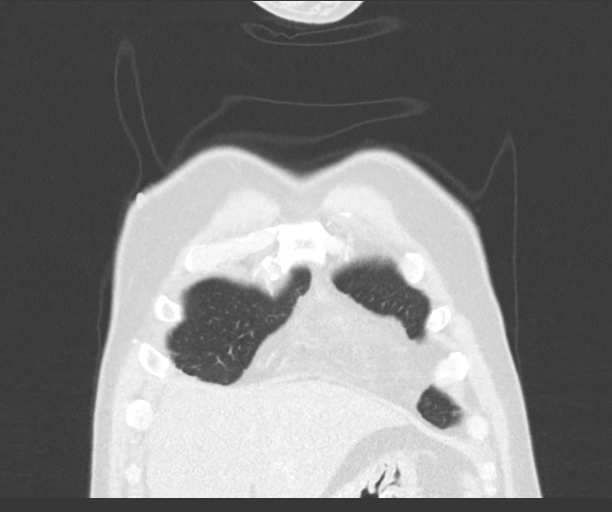
[im 64/160  lung]
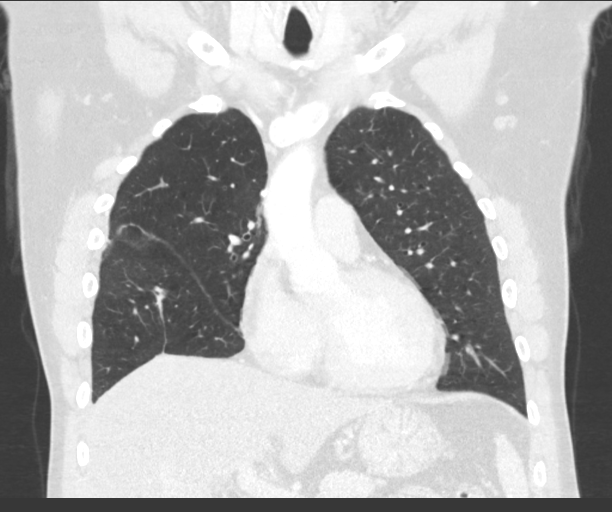
[im 96/160  lung]
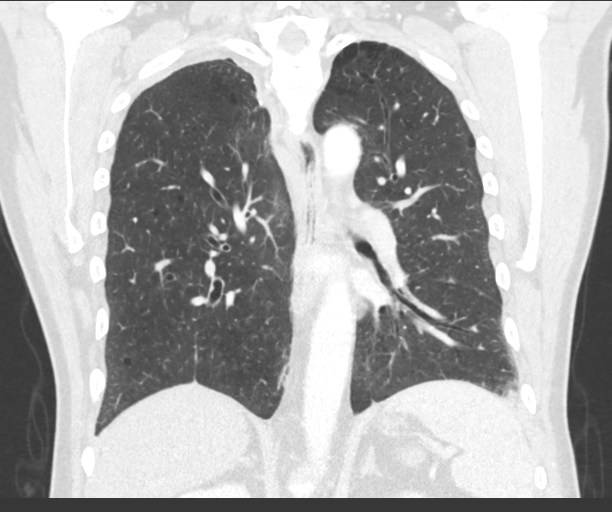

[15 of 36 positions shown; findings below may reference images not displayed]

FINDINGS: Cardiovascular: No acute findings. Mild coronary artery
calcification again noted.

Mediastinum/Nodes: 12 mm high right paratracheal mediastinal lymph
node on image [DATE] remains stable. Sub-cm mediastinal lymph nodes in
the aortopulmonary window are also stable. No hilar or axillary
lymphadenopathy.

Lungs/Pleura: Postop changes from right upper lobectomy remains
stable. Right middle and lower lobe scarring is unchanged. No
suspicious pulmonary nodules or masses are identified. No evidence
of pleural effusion. Mild centrilobular and paraseptal emphysema
remains stable.

Upper Abdomen: Normal adrenal glands. Diffuse hepatic steatosis
again noted.

Musculoskeletal: No suspicious bone lesions or other significant
abnormality.
IMPRESSION: Stable mildly enlarged high right paratracheal mediastinal lymph
node, and sub-cm lymph nodes in the AP window. No new or progressive
disease within the thorax.

Stable postop changes in right hemithorax and emphysema.

Incidental findings including coronary artery calcification and
diffuse hepatic steatosis.

## 2017-10-26 DIAGNOSIS — G4733 Obstructive sleep apnea (adult) (pediatric): Secondary | ICD-10-CM | POA: Diagnosis not present

## 2017-10-26 DIAGNOSIS — Q381 Ankyloglossia: Secondary | ICD-10-CM | POA: Diagnosis not present

## 2017-10-27 ENCOUNTER — Telehealth: Payer: Self-pay

## 2017-10-27 DIAGNOSIS — G4733 Obstructive sleep apnea (adult) (pediatric): Secondary | ICD-10-CM | POA: Diagnosis not present

## 2017-10-27 NOTE — Telephone Encounter (Signed)
Key: [id number]

## 2017-10-30 ENCOUNTER — Other Ambulatory Visit: Payer: Self-pay | Admitting: Internal Medicine

## 2017-10-30 DIAGNOSIS — F200 Paranoid schizophrenia: Secondary | ICD-10-CM

## 2017-11-02 ENCOUNTER — Other Ambulatory Visit: Payer: Self-pay | Admitting: *Deleted

## 2017-11-02 DIAGNOSIS — G4733 Obstructive sleep apnea (adult) (pediatric): Secondary | ICD-10-CM

## 2017-11-03 ENCOUNTER — Ambulatory Visit (INDEPENDENT_AMBULATORY_CARE_PROVIDER_SITE_OTHER): Payer: Medicare Other

## 2017-11-03 ENCOUNTER — Other Ambulatory Visit: Payer: Self-pay

## 2017-11-03 ENCOUNTER — Ambulatory Visit (HOSPITAL_COMMUNITY)
Admission: EM | Admit: 2017-11-03 | Discharge: 2017-11-03 | Disposition: A | Discharge status: Home / Self Care | Payer: Medicare Other | Attending: Internal Medicine | Admitting: Internal Medicine

## 2017-11-03 ENCOUNTER — Encounter (HOSPITAL_COMMUNITY): Payer: Self-pay | Admitting: Emergency Medicine

## 2017-11-03 DIAGNOSIS — R0602 Shortness of breath: Secondary | ICD-10-CM | POA: Diagnosis not present

## 2017-11-03 DIAGNOSIS — J441 Chronic obstructive pulmonary disease with (acute) exacerbation: Secondary | ICD-10-CM

## 2017-11-03 DIAGNOSIS — J069 Acute upper respiratory infection, unspecified: Secondary | ICD-10-CM

## 2017-11-03 DIAGNOSIS — R05 Cough: Secondary | ICD-10-CM | POA: Diagnosis not present

## 2017-11-03 DIAGNOSIS — J301 Allergic rhinitis due to pollen: Secondary | ICD-10-CM

## 2017-11-03 MED ORDER — DOXYCYCLINE HYCLATE 100 MG PO CAPS: 100.0000 mg | ORAL_CAPSULE | Freq: Two times a day (BID) | ORAL | 0 refills | Status: DC

## 2017-11-03 MED ORDER — FLUTICASONE PROPIONATE 50 MCG/ACT NA SUSP: 1.0000 | Freq: Every day | NASAL | 0 refills | Status: DC

## 2017-11-03 MED ORDER — CETIRIZINE HCL 10 MG PO TABS: 10.0000 mg | ORAL_TABLET | Freq: Every day | ORAL | 1 refills | Status: DC

## 2017-11-03 MED ORDER — BENZONATATE 200 MG PO CAPS: 200.0000 mg | ORAL_CAPSULE | Freq: Three times a day (TID) | ORAL | 0 refills | Status: DC

## 2017-11-03 MED ORDER — PREDNISONE 20 MG PO TABS: 40.0000 mg | ORAL_TABLET | Freq: Every day | ORAL | 0 refills | Status: AC
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
## 2017-11-03 NOTE — ED Triage Notes (Signed)
The patient presented to the [facility] with a complaint of a cough, congestion and chills x 1 week.

## 2017-11-03 NOTE — Discharge Instructions (Signed)
Please take doxycycline twice daily for 10 days.  Take prednisone 40 mg daily x 4 days  Please take daily Zyrtec and use Flonase nasal spray daily for your congestion.  Please use Tessalon for cough.  Continue using your inhalers as prescribed.  Return if symptoms not improving with treatment or worsening, develop shortness of breath, increased difficulty breathing.

## 2017-11-09 ENCOUNTER — Ambulatory Visit (INDEPENDENT_AMBULATORY_CARE_PROVIDER_SITE_OTHER): Payer: Medicare Other | Admitting: Internal Medicine

## 2017-11-09 ENCOUNTER — Encounter: Payer: Self-pay | Admitting: Internal Medicine

## 2017-11-09 VITALS — BP 134/66 | HR 82 | Ht 73.0 in | Wt 209.0 lb

## 2017-11-09 DIAGNOSIS — J449 Chronic obstructive pulmonary disease, unspecified: Secondary | ICD-10-CM

## 2017-11-09 DIAGNOSIS — F17210 Nicotine dependence, cigarettes, uncomplicated: Secondary | ICD-10-CM

## 2017-11-09 LAB — PULMONARY FUNCTION TEST
DL/VA % PRED: 57 %
DL/VA: 2.76 ml/min/mmHg/L
DLCO UNC: 16.3 ml/min/mmHg
DLCO unc % pred: 44 %
FEF 25-75 POST: 1.42 L/s
FEF 25-75 PRE: 1.38 L/s
FEF2575-%CHANGE-POST: 2 %
FEF2575-%PRED-POST: 43 %
FEF2575-%Pred-Pre: 41 %
FEV1-%CHANGE-POST: -1 %
FEV1-%Pred-Post: 65 %
FEV1-%Pred-Pre: 66 %
FEV1-Post: 2.31 L
FEV1-Pre: 2.36 L
FEV1FVC-%CHANGE-POST: 2 %
FEV1FVC-%PRED-PRE: 87 %
FEV6-%CHANGE-POST: -4 %
FEV6-%PRED-POST: 71 %
FEV6-%Pred-Pre: 75 %
FEV6-Post: 3.14 L
FEV6-Pre: 3.3 L
FEV6FVC-%CHANGE-POST: 0 %
FEV6FVC-%Pred-Post: 99 %
FEV6FVC-%Pred-Pre: 100 %
FVC-%Change-Post: -4 %
FVC-%PRED-POST: 72 %
FVC-%Pred-Pre: 75 %
FVC-Post: 3.26 L
FVC-Pre: 3.4 L
POST FEV1/FVC RATIO: 71 %
Post FEV6/FVC ratio: 96 %
Pre FEV1/FVC ratio: 69 %
Pre FEV6/FVC Ratio: 97 %
RV % pred: 108 %
RV: 2.57 L
TLC % pred: 83 %
TLC: 6.36 L

## 2017-11-09 NOTE — Assessment & Plan Note (Signed)
>   3 min discussion I reviewed the Fletcher curve with the patient that basically indicates  if you quit smoking when your best day FEV1 is relatively well  preserved (as is still   the case here)  it is highly unlikely you will progress to severe disease and informed the patient there was  no medication on the market that has proven to alter the curve/ its downward trajectory  or the likelihood of progression of their disease(unlike other chronic medical conditions such as atheroclerosis where we do think we can change the natural hx with risk reducing meds)    Therefore stopping smoking and maintaining abstinence are  the most important aspects of care, not choice of inhalers or for that matter, doctors.   Treatment other than smoking cessation  is entirely directed by severity of symptoms and focused also on reducing exacerbations, not attempting to change the natural history of the disease.

## 2017-11-09 NOTE — Progress Notes (Signed)
Subjective:     Patient ID: [name], male   DOB: 02-Sep-1961,    MRN: [id number]   Brief patient profile:  40 yobm active smoker with doe since around 2007 referred to pulmonary clinic 10/12/2017 by Dr  Ronnald Ramp for copd eval with prev study 10/2009 c/w GOLD 0 criteria and s/p RULobectomy for Stage I [facility] 09/04/15 s adjuvant - prev seen by Dr Annamaria Boots for ?OSA and here in 2016:  - pft's 10/28/09 FEV1 3.16 (70%) ratio 73 with air trapping and dloc 49% corrects to 69% for alv volume 08/11/2015  extensive coaching HFA effectiveness =    75%  > try off dpi and on dulera 100 2bid    History of Present Illness  10/12/2017   Pulmonary office visit/ Liadan Guizar / re -establish re sob  Chief Complaint  Patient presents with  . Pulmonary Consult    Last seen 08/11/15 and was referred back by Dr Scarlette Calico. Pt c/o increased SOB for the past 6 months. He uses his albuterol inhaler 3-4 x per day. He also c/o non prod cough.  He states he gets SOB walking approx 20 ft. He also has a non prod cough. He is using   progressive doe x 6 m to point of room to room (note not reproducible here) assoc with dry cough neither symptom improves much with saba up to qid and can't sleep due to choking sensations after asleep for just an hour or so. rec Plan A = Automatic = stop anoro and restart Dulera 100 Take 2 puffs first thing in am and then another 2 puffs about 12 hours later.  Work on inhaler technique:  relax and gently blow all the way out then  Plan B = Backup Only use your albuterol (ventolin) as a rescue medication   Please schedule a follow up office visit in 4 weeks, sooner if needed  with all medications /inhalers/ solutions in hand so we can verify exactly what you are taking. This includes all medications from all doctors and over the counters - with PFTs on return     11/09/2017  f/u ov/Ameli Sangiovanni re: GOLD 0 / still smoking - still on anoro / no meds as req  Chief Complaint  Patient presents with  . Follow-up   PFT, SOB w/activity and at rest,cough w/ beige sputum,wakes up at night w/ dry mouth,has never started dulera inhaler  Dyspnea:  Room to room if walks fast, better slow MMRC2 = can't walk a nl pace on a flat grade s sob but does fine slow and flat  Cough: mostly dry worse hs like choking  Sleep: disturbed by dry mouth/ gasping for air  saba still using q am    No obvious day to day or daytime variability or assoc excess/ purulent sputum or mucus plugs or hemoptysis or cp or chest tightness, subjective wheeze or overt sinus or hb symptoms. No unusual exposure hx or h/o childhood pna/ asthma or knowledge of premature birth.   Also denies any obvious fluctuation of symptoms with weather or environmental changes or other aggravating or alleviating factors except as outlined above   Current Allergies, Complete Past Medical History, Past Surgical History, Family History, and Social History were reviewed in [employer] record.  ROS  The following are not active complaints unless bolded Hoarseness, sore throat, dysphagia, dental problems, itching, sneezing,  nasal congestion or discharge of excess mucus or purulent secretions, ear ache,   fever, chills, sweats, unintended wt  loss or wt gain, classically pleuritic or exertional cp,  orthopnea pnd or leg swelling, presyncope, palpitations, abdominal pain, anorexia, nausea, vomiting, diarrhea  or change in bowel habits or change in bladder habits, change in stools or change in urine, dysuria, hematuria,  rash, arthralgias, visual complaints, headache, numbness, weakness or ataxia or problems with walking or coordination,  change in mood/affect or memory.        Current Meds  Medication Sig  . carvedilol (COREG) 12.5 MG tablet Take 1 tablet (12.5 mg total) by mouth 2 (two) times daily with a meal.  . cetirizine (ZYRTEC) 10 MG tablet Take 1 tablet (10 mg total) by mouth daily for 15 days.  . ciclopirox (PENLAC) 8 % solution APPLY  topically AT BEDTIME. APPLY ALL over NAIL FOLD fold & surrounding SKIN. APPLY daily over prevoius coat, AFTER SEVEN DAYS REMOVE with a  . diclofenac sodium (VOLTAREN) 1 % GEL Apply 2 g topically 4 (four) times daily.  Marland Kitchen emtricitabine-tenofovir (TRUVADA) 200-300 MG tablet Take 1 tablet by mouth daily.  Marland Kitchen esomeprazole (NEXIUM) 40 MG capsule Take 1 capsule (40 mg total) by mouth every morning.  . Eszopiclone 3 MG TABS TAKE ONE TABLET BY MOUTH AT BEDTIME. Take immediately BEFORE bedtime  . fluticasone (FLONASE) 50 MCG/ACT nasal spray Place 1 spray into both nostrils daily for 7 days.  Marland Kitchen losartan (COZAAR) 25 MG tablet Take 1 tablet (25 mg total) daily by mouth.  . pravastatin (PRAVACHOL) 40 MG tablet Take 1 tablet (40 mg total) by mouth daily.  . traZODone (DESYREL) 100 MG tablet TAKE THREE TABLETS BY MOUTH AT BEDTIME  . VENTOLIN HFA 108 (90 Base) MCG/ACT inhaler Inhale 1-2 puffs into the lungs every 6 (six) hours as needed for wheezing or shortness of breath.  . Vilazodone HCl (VIIBRYD) 40 MG TABS Take 1 tablet (40 mg total) by mouth daily.  . [DISCONTINUED] benzonatate (TESSALON) 200 MG capsule Take 1 capsule (200 mg total) by mouth every 8 (eight) hours for 7 days.  . [DISCONTINUED] doxycycline (VIBRAMYCIN) 100 MG capsule Take 1 capsule (100 mg total) by mouth 2 (two) times daily for 10 days.  . [DISCONTINUED] QUEtiapine (SEROQUEL) 100 MG tablet TAKE ONE TABLET BY MOUTH AT NIGHT  . [DISCONTINUED] QUEtiapine (SEROQUEL) 200 MG tablet Take 1 tablet (200 mg total) by mouth 2 (two) times daily.                Objective:   Physical Exam    amb thin bm abn affect   11/09/2017        209   10/12/17 202 lb (91.6 kg)  10/09/17 204 lb 9.6 oz (92.8 kg)  10/03/17 201 lb (91.2 kg)      Vital signs reviewed - Note on arrival 02 sats  97% on RA      HEENT: nl  turbinates bilaterally, and oropharynx. Nl external ear canals without cough reflex - full detures    NECK :  without JVD/Nodes/TM/ nl  carotid upstrokes bilaterally   LUNGS: no acc muscle use,  Nl contour chest which is clear to A and P bilaterally without cough on insp or exp maneuvers   CV:  RRR  no s3 or murmur or increase in P2, and no edema   ABD:  soft and nontender with nl inspiratory excursion in the supine position. No bruits or organomegaly appreciated, bowel sounds nl  MS:  Nl gait/ ext warm without deformities, calf tenderness, cyanosis or clubbing No obvious joint restrictions  SKIN: warm and dry without lesions    NEURO:  alert, approp, nl sensorium with  no motor or cerebellar deficits apparent.      I personally reviewed images and agree with radiology impression as follows:  CXR:   11/03/17 Mild acute bronchitic changes superimposed on mild changes of COPD and chronic bronchitis.      Assessment:

## 2017-11-09 NOTE — Assessment & Plan Note (Signed)
-   pft's 10/28/09 FEV1 3.16 (70%) ratio 73 with air trapping and dloc 49% corrects to 69% for alv volume - 10/12/2017   Walked RA  2 laps @ 185 ft each stopped due to  Sob , no desats - PFT's  11/09/2017  FEV1 2.31 (66% ) ratio 69  p no % improvement from saba p anoro prior to study with DLCO  44 % corrects to 57 % for alv volume   If use fev1 2.36 vs VC 3.79 =  62%  With mild curvature confirming obst  - 11/09/2017  After extensive coaching inhaler device  effectiveness =    75% try duler 100 2bid   DDX of  difficult airways management almost all start with A and  include Adherence, Ace Inhibitors, Acid Reflux, Active Sinus Disease, Alpha 1 Antitripsin deficiency, Anxiety masquerading as Airways dz,  ABPA,  Allergy(esp in young), Aspiration (esp in elderly), Adverse effects of meds,  Active smokers, A bunch of PE's (a small clot burden can't cause this syndrome unless there is already severe underlying pulm or vascular dz with poor reserve) plus two Bs  = Bronchiectasis and Beta blocker use..and one C= CHF  Adherence is always the initial "prime suspect" and is a multilayered concern that requires a "trust but verify" approach in every patient - starting with knowing how to use medications, especially inhalers, correctly, keeping up with refills and understanding the fundamental difference between maintenance and prns vs those medications only taken for a very short course and then stopped and not refilled.  - see hfa teaching - return with all meds in hand using a trust but verify approach to confirm accurate Medication  Reconciliation The principal here is that until we are certain that the  patients are doing what we've asked, it makes no sense to ask them to do more.   Active smoking top of the usual list of suspects    ? Adverse effects of dpi esp lama with dry mouth > try dulera 100 bid   ? Acid (or non-acid) GERD > always difficult to exclude as up to 75% of pts in some series report no assoc GI/  Heartburn symptoms> rec continue nexium  ? BB effects > probably not an issue given how little true obst he has    I had an extended discussion with the patient reviewing all relevant studies completed to date and  lasting 15 to 20 minutes of a 25 minute visit    Each maintenance medication was reviewed in detail including most importantly the difference between maintenance and prns and under what circumstances the prns are to be triggered using an action plan format that is not reflected in the computer generated alphabetically organized AVS.    Please see AVS for specific instructions unique to this visit that I personally wrote and verbalized to the the pt in detail and then reviewed with pt  by my nurse highlighting any  changes in therapy recommended at today's visit to their plan of care.

## 2017-11-09 NOTE — Progress Notes (Signed)
PFT done today. 

## 2017-11-09 NOTE — Patient Instructions (Addendum)
Plan A = Automatic = stop anoro and restart Dulera 100 Take 2 puffs first thing in am and then another 2 puffs about 12 hours later.   Work on inhaler technique:  relax and gently blow all the way out then take a nice smooth deep breath back in, triggering the inhaler at same time you start breathing in.  Hold for up to 5 seconds if you can. Blow out thru nose. Rinse and gargle with water when done   Plan B = Backup Only use your albuterol (ventolin) as a rescue medication to be used if you can't catch your breath by resting or doing a relaxed purse lip breathing pattern.  - The less you use it, the better it will work when you need it. - Ok to use the inhaler up to 2 puffs  every 4 hours if you must but call for appointment if use goes up over your usual need - Don't leave home without it !!  (think of it like the spare tire for your car)     The key is to stop smoking completely before smoking completely stops you!     Please schedule a follow up visit in 3 months but call sooner if needed  with all medications /inhalers/ solutions in hand so we can verify exactly what you are taking. This includes all medications from all doctors and over the counters

## 2017-11-16 ENCOUNTER — Encounter: Payer: Self-pay | Admitting: Internal Medicine

## 2017-11-16 ENCOUNTER — Inpatient Hospital Stay: Payer: Self-pay | Admitting: Internal Medicine

## 2017-11-23 ENCOUNTER — Inpatient Hospital Stay: Payer: Medicare Other | Attending: Internal Medicine

## 2017-11-23 ENCOUNTER — Ambulatory Visit: Payer: Self-pay | Admitting: Internal Medicine

## 2017-11-23 DIAGNOSIS — Z902 Acquired absence of lung [part of]: Secondary | ICD-10-CM | POA: Insufficient documentation

## 2017-11-23 DIAGNOSIS — C3411 Malignant neoplasm of upper lobe, right bronchus or lung: Secondary | ICD-10-CM | POA: Insufficient documentation

## 2017-11-23 DIAGNOSIS — C3491 Malignant neoplasm of unspecified part of right bronchus or lung: Secondary | ICD-10-CM

## 2017-11-23 LAB — CBC WITH DIFFERENTIAL/PLATELET
BASOS ABS: 0 10*3/uL (ref 0.0–0.1)
Basophils Relative: 1 %
Eosinophils Absolute: 0.1 10*3/uL (ref 0.0–0.5)
Eosinophils Relative: 1 %
HEMATOCRIT: 45.5 % (ref 38.4–49.9)
Hemoglobin: 15.4 g/dL (ref 13.0–17.1)
LYMPHS ABS: 2.1 10*3/uL (ref 0.9–3.3)
LYMPHS PCT: 34 %
MCH: 31.4 pg (ref 27.2–33.4)
MCHC: 33.8 g/dL (ref 32.0–36.0)
MCV: 92.7 fL (ref 79.3–98.0)
MONO ABS: 0.5 10*3/uL (ref 0.1–0.9)
Monocytes Relative: 7 %
NEUTROS ABS: 3.6 10*3/uL (ref 1.5–6.5)
Neutrophils Relative %: 57 %
Platelets: 98 10*3/uL — ABNORMAL LOW (ref 140–400)
RBC: 4.91 MIL/uL (ref 4.20–5.82)
RDW: 14.6 % (ref 11.0–14.6)
WBC: 6.4 10*3/uL (ref 4.0–10.3)

## 2017-11-23 LAB — COMPREHENSIVE METABOLIC PANEL
ALBUMIN: 3.6 g/dL (ref 3.5–5.0)
ALT: 22 U/L (ref 0–55)
AST: 18 U/L (ref 5–34)
Alkaline Phosphatase: 83 U/L (ref 40–150)
Anion gap: 8 (ref 3–11)
BILIRUBIN TOTAL: 0.5 mg/dL (ref 0.2–1.2)
BUN: 10 mg/dL (ref 7–26)
CHLORIDE: 111 mmol/L — AB (ref 98–109)
CO2: 23 mmol/L (ref 22–29)
Calcium: 9.4 mg/dL (ref 8.4–10.4)
Creatinine, Ser: 0.86 mg/dL (ref 0.70–1.30)
GFR calc Af Amer: >60 mL/min (ref >60)
GFR calc non Af Amer: >60 mL/min (ref >60)
GLUCOSE: 103 mg/dL (ref 70–140)
POTASSIUM: 4.6 mmol/L (ref 3.5–5.1)
SODIUM: 142 mmol/L (ref 136–145)
TOTAL PROTEIN: 6.7 g/dL (ref 6.4–8.3)

## 2017-11-27 ENCOUNTER — Ambulatory Visit (HOSPITAL_COMMUNITY)
Admission: RE | Admit: 2017-11-27 | Discharge: 2017-11-27 | Disposition: A | Discharge status: Home / Self Care | Payer: Medicare Other | Source: Ambulatory Visit | Attending: Internal Medicine | Admitting: Internal Medicine

## 2017-11-27 ENCOUNTER — Encounter (HOSPITAL_COMMUNITY): Payer: Self-pay

## 2017-11-27 DIAGNOSIS — C3491 Malignant neoplasm of unspecified part of right bronchus or lung: Secondary | ICD-10-CM

## 2017-11-27 DIAGNOSIS — C3411 Malignant neoplasm of upper lobe, right bronchus or lung: Secondary | ICD-10-CM | POA: Diagnosis not present

## 2017-11-27 MED ORDER — IOPAMIDOL (ISOVUE-300) INJECTION 61%
75.0000 mL | Freq: Once | INTRAVENOUS | Status: AC | PRN
  Administered 2017-11-27: 75 mL via INTRAVENOUS

## 2017-11-27 MED ORDER — SODIUM CHLORIDE 0.9 % IJ SOLN
INTRAMUSCULAR | Status: AC
  Filled 2017-11-27: qty 50

## 2017-11-27 MED ORDER — IOPAMIDOL (ISOVUE-300) INJECTION 61%
INTRAVENOUS | Status: AC
  Administered 2017-11-27: 75 mL via INTRAVENOUS
  Filled 2017-11-27: qty 75

## 2017-11-29 IMAGING — CT CT ANGIO CHEST
3 of 7 series · 19 of 36 positions shown · IV contrast (ISOVUE 370)
Comparison: Portable chest obtained earlier today. Chest CT dated
08/05/2016.

CLINICAL DATA: Chest pain, shortness of breath and elevated
D-dimer. Previous right upper lobe lobectomy for non-small cell lung
carcinoma.

EXAM:
CT ANGIOGRAPHY CHEST WITH CONTRAST
TECHNIQUE: Multidetector CT imaging of the chest was performed using the
standard protocol during bolus administration of intravenous
contrast. Multiplanar CT image reconstructions and MIPs were
obtained to evaluate the vascular anatomy.
CONTRAST:  100 cc Isovue 370

[Series 5: coronal mpr · coronal · 0.56mm/px · 1 of 151 slices shown]
[im 76/151  mediastinal]
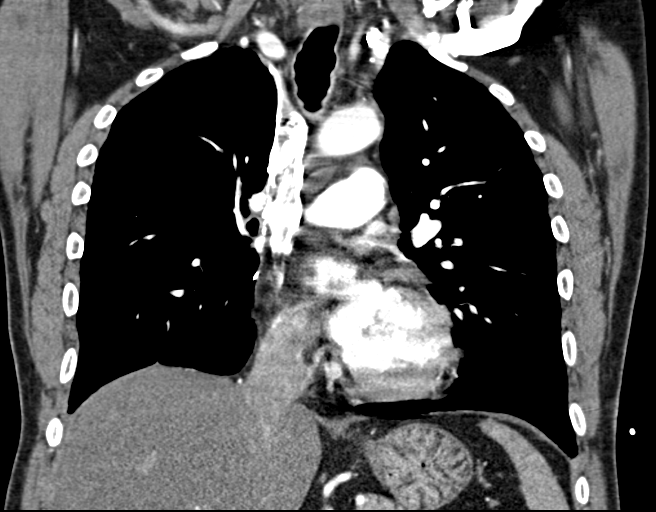

[Series 10: thins for pacs · axial · 0.82mm/px · z∈[-243,+20]mm · 15 of 301 slices shown]
[im 19/301  lung]
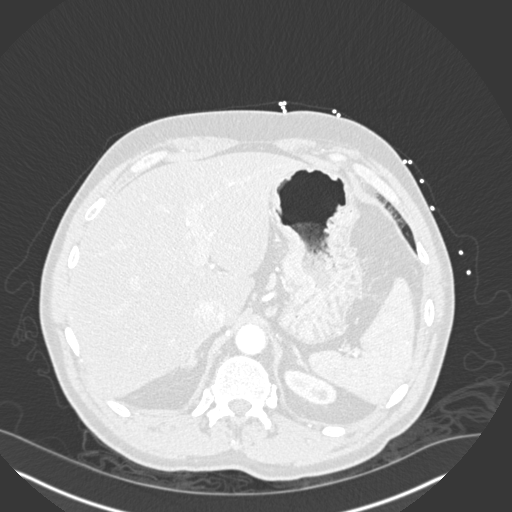
[im 38/301  mediastinal]
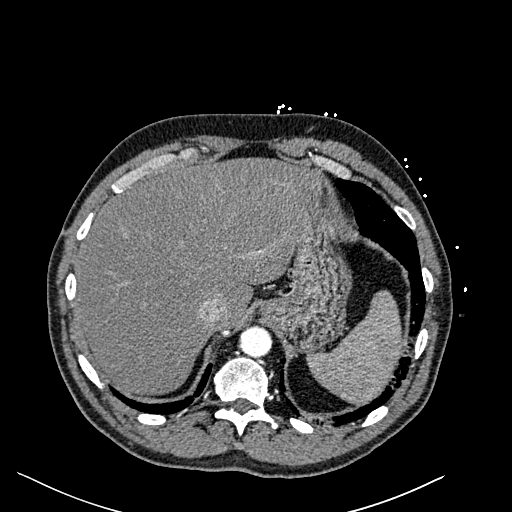
[im 57/301  lung]
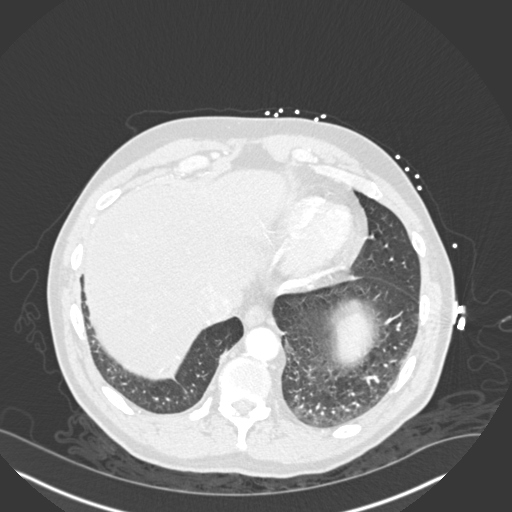
[im 76/301  mediastinal]
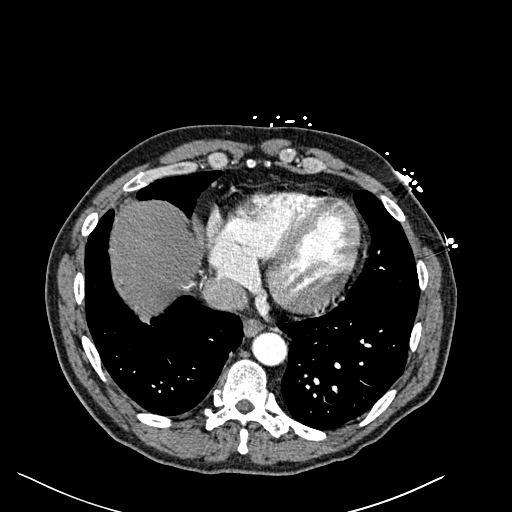
[im 94/301  lung]
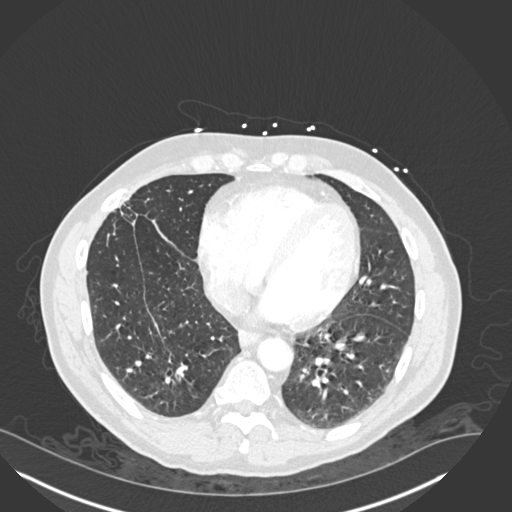
[im 113/301  mediastinal]
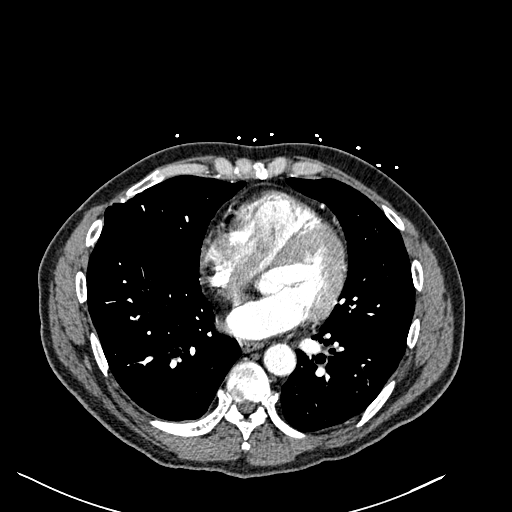
[im 132/301  lung]
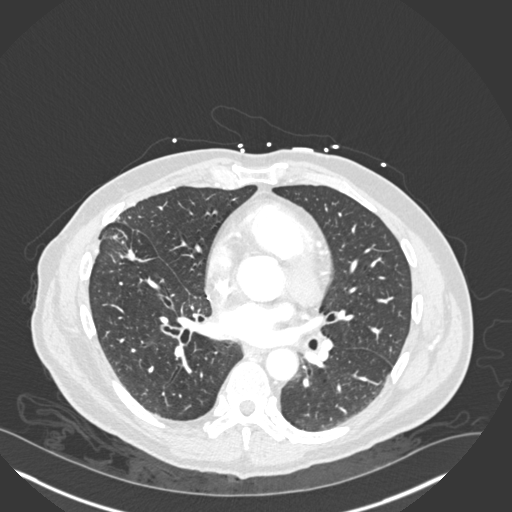
[im 151/301  mediastinal]
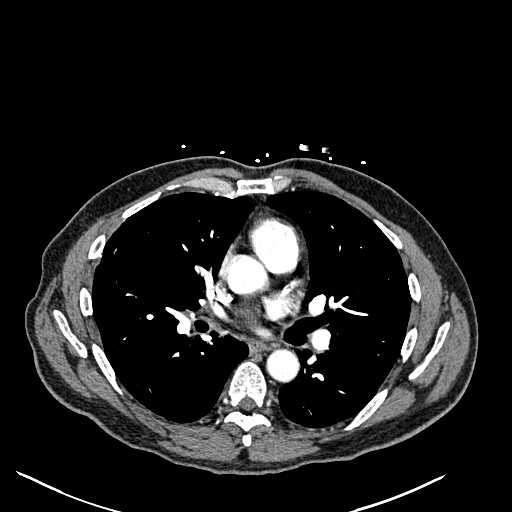
[im 169/301  lung]
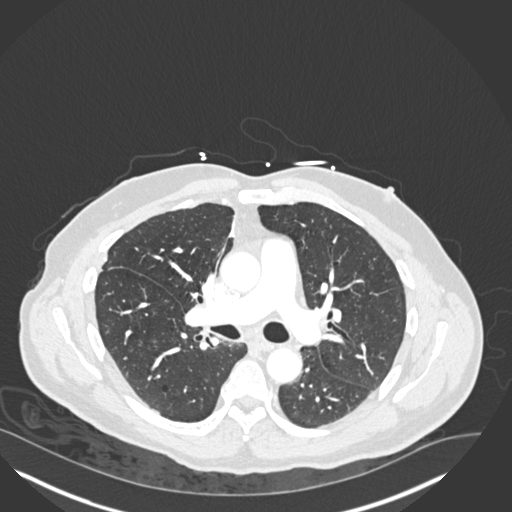
[im 188/301  mediastinal]
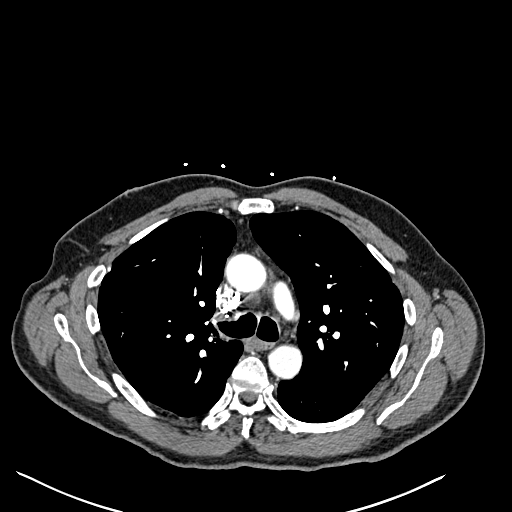
[im 207/301  lung]
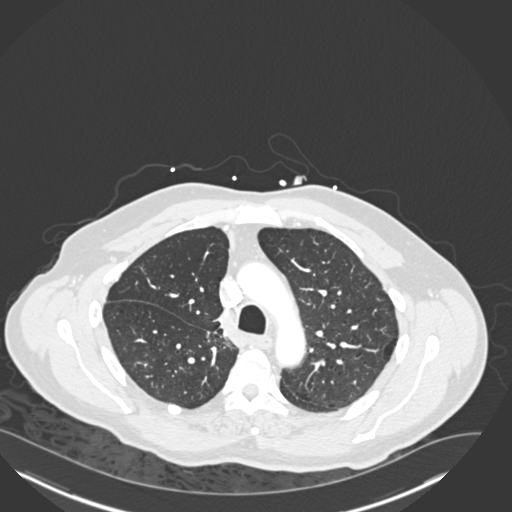
[im 226/301  mediastinal]
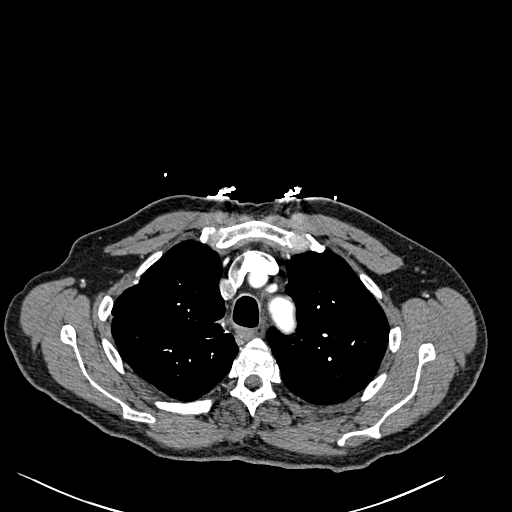
[im 244/301  lung]
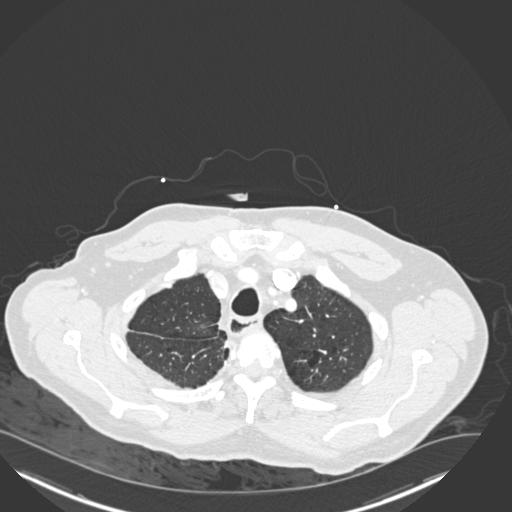
[im 263/301  mediastinal]
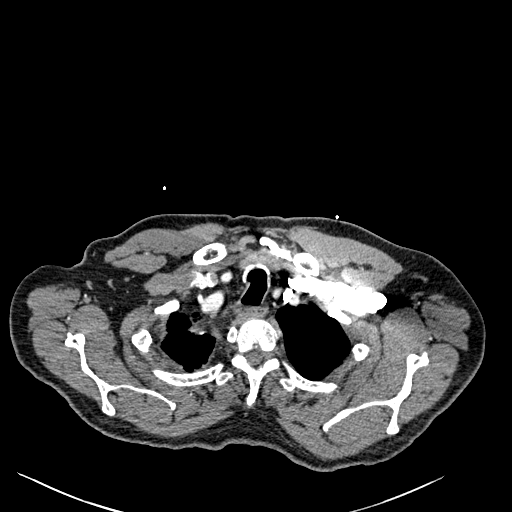
[im 282/301  lung]
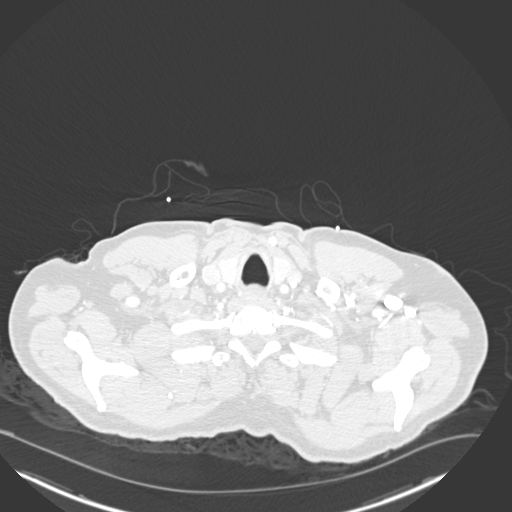

[Series 11: lung windows · axial · 0.82mm/px · z∈[-180,-36]mm · 3 of 98 slices shown]
[im 25/98  mediastinal]
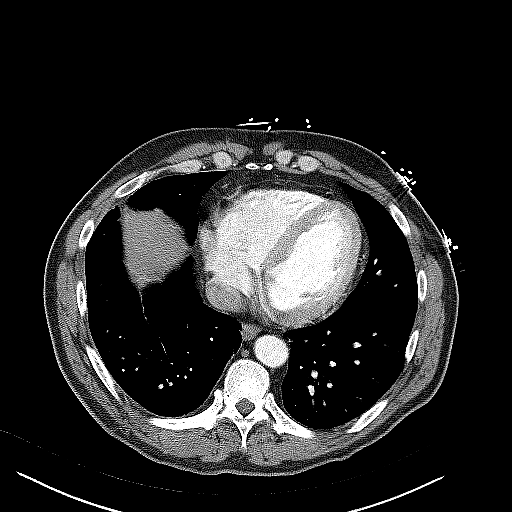
[im 49/98  mediastinal]
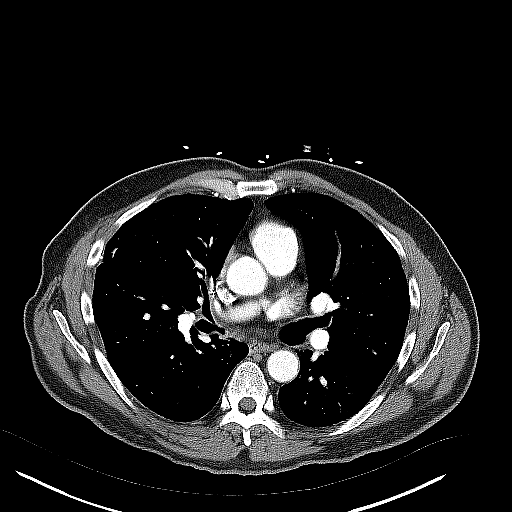
[im 73/98  mediastinal]
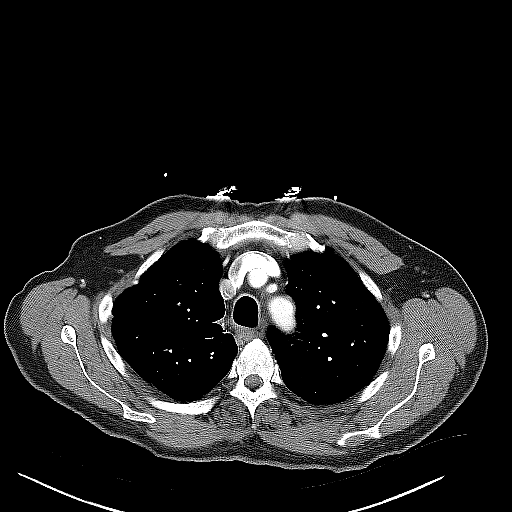

[19 of 36 positions shown; findings below may reference images not displayed]

FINDINGS: Cardiovascular: Satisfactory opacification of the pulmonary arteries
to the segmental level. No evidence of pulmonary embolism. Normal
heart size. No pericardial effusion.

Mediastinum/Nodes: No significant change in mildly enlarged
mediastinal lymph nodes. The previously demonstrated 12 mm short
axis right paratracheal node has a short axis diameter of 10 mm on
image number 15 of series 4 today.

Lungs/Pleura: Post thoracotomy changes on the right. Mild diffuse
peribronchial thickening. Mild bilateral bullous changes.

Upper Abdomen: Diffuse low density of the liver relative to the
spleen.

Musculoskeletal: Post thoracotomy changes on the right. Mild
thoracic spine degenerative changes.

Review of the MIP images confirms the above findings.
IMPRESSION: 1. No pulmonary emboli or other acute abnormality.
2. Stable mild mediastinal adenopathy.
3. Mild changes of COPD and chronic bronchitis.
4. Diffuse hepatic steatosis.

## 2017-11-30 ENCOUNTER — Encounter: Payer: Self-pay | Admitting: Internal Medicine

## 2017-11-30 ENCOUNTER — Inpatient Hospital Stay: Payer: Medicare Other | Attending: Internal Medicine | Admitting: Internal Medicine

## 2017-11-30 ENCOUNTER — Telehealth: Payer: Self-pay | Admitting: Internal Medicine

## 2017-11-30 DIAGNOSIS — B1910 Unspecified viral hepatitis B without hepatic coma: Secondary | ICD-10-CM | POA: Diagnosis not present

## 2017-11-30 DIAGNOSIS — F329 Major depressive disorder, single episode, unspecified: Secondary | ICD-10-CM | POA: Diagnosis not present

## 2017-11-30 DIAGNOSIS — Z902 Acquired absence of lung [part of]: Secondary | ICD-10-CM | POA: Diagnosis not present

## 2017-11-30 DIAGNOSIS — Z955 Presence of coronary angioplasty implant and graft: Secondary | ICD-10-CM | POA: Insufficient documentation

## 2017-11-30 DIAGNOSIS — F17210 Nicotine dependence, cigarettes, uncomplicated: Secondary | ICD-10-CM

## 2017-11-30 DIAGNOSIS — Z8711 Personal history of peptic ulcer disease: Secondary | ICD-10-CM | POA: Diagnosis not present

## 2017-11-30 DIAGNOSIS — J449 Chronic obstructive pulmonary disease, unspecified: Secondary | ICD-10-CM | POA: Insufficient documentation

## 2017-11-30 DIAGNOSIS — F209 Schizophrenia, unspecified: Secondary | ICD-10-CM | POA: Insufficient documentation

## 2017-11-30 DIAGNOSIS — Z79899 Other long term (current) drug therapy: Secondary | ICD-10-CM | POA: Diagnosis not present

## 2017-11-30 DIAGNOSIS — Z85118 Personal history of other malignant neoplasm of bronchus and lung: Secondary | ICD-10-CM | POA: Insufficient documentation

## 2017-11-30 DIAGNOSIS — C3490 Malignant neoplasm of unspecified part of unspecified bronchus or lung: Secondary | ICD-10-CM

## 2017-11-30 DIAGNOSIS — I110 Hypertensive heart disease with heart failure: Secondary | ICD-10-CM | POA: Diagnosis not present

## 2017-11-30 DIAGNOSIS — K219 Gastro-esophageal reflux disease without esophagitis: Secondary | ICD-10-CM | POA: Diagnosis not present

## 2017-11-30 DIAGNOSIS — I509 Heart failure, unspecified: Secondary | ICD-10-CM | POA: Diagnosis not present

## 2017-11-30 DIAGNOSIS — E1151 Type 2 diabetes mellitus with diabetic peripheral angiopathy without gangrene: Secondary | ICD-10-CM | POA: Insufficient documentation

## 2017-11-30 DIAGNOSIS — M1990 Unspecified osteoarthritis, unspecified site: Secondary | ICD-10-CM | POA: Diagnosis not present
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
## 2017-11-30 NOTE — Patient Instructions (Signed)
Steps to Quit Smoking Smoking tobacco can be bad for your health. It can also affect almost every organ in your body. Smoking puts you and people around you at risk for many serious long-lasting (chronic) diseases. Quitting smoking is hard, but it is one of the best things that you can do for your health. It is never too late to quit. What are the benefits of quitting smoking? When you quit smoking, you lower your risk for getting serious diseases and conditions. They can include:  Lung cancer or lung disease.  Heart disease.  Stroke.  Heart attack.  Not being able to have children (infertility).  Weak bones (osteoporosis) and broken bones (fractures).  If you have coughing, wheezing, and shortness of breath, those symptoms may get better when you quit. You may also get sick less often. If you are pregnant, quitting smoking can help to lower your chances of having a baby of low birth weight. What can I do to help me quit smoking? Talk with your doctor about what can help you quit smoking. Some things you can do (strategies) include:  Quitting smoking totally, instead of slowly cutting back how much you smoke over a period of time.  Going to in-person counseling. You are more likely to quit if you go to many counseling sessions.  Using resources and support systems, such as: ? Online chats with a counselor. ? Phone quitlines. ? Printed self-help materials. ? Support groups or group counseling. ? Text messaging programs. ? Mobile phone apps or applications.  Taking medicines. Some of these medicines may have nicotine in them. If you are pregnant or breastfeeding, do not take any medicines to quit smoking unless your doctor says it is okay. Talk with your doctor about counseling or other things that can help you.  Talk with your doctor about using more than one strategy at the same time, such as taking medicines while you are also going to in-person counseling. This can help make  quitting easier. What things can I do to make it easier to quit? Quitting smoking might feel very hard at first, but there is a lot that you can do to make it easier. Take these steps:  Talk to your family and friends. Ask them to support and encourage you.  Call phone quitlines, reach out to support groups, or work with a counselor.  Ask people who smoke to not smoke around you.  Avoid places that make you want (trigger) to smoke, such as: ? Bars. ? Parties. ? Smoke-break areas at work.  Spend time with people who do not smoke.  Lower the stress in your life. Stress can make you want to smoke. Try these things to help your stress: ? Getting regular exercise. ? Deep-breathing exercises. ? Yoga. ? Meditating. ? Doing a body scan. To do this, close your eyes, focus on one area of your body at a time from head to toe, and notice which parts of your body are tense. Try to relax the muscles in those areas.  Download or buy apps on your mobile phone or tablet that can help you stick to your quit plan. There are many free apps, such as QuitGuide from the CDC (Centers for Disease Control and Prevention). You can find more support from smokefree.gov and other websites.  This information is not intended to replace advice given to you by your health care provider. Make sure you discuss any questions you have with your health care provider. Document Released: 07/09/2009 Document   Revised: 05/10/2016 Document Reviewed: 01/27/2015 Elsevier Interactive Patient Education  2018 [employer].  

## 2017-11-30 NOTE — Telephone Encounter (Signed)
Appointments scheduled Patient declined AVS/Calendar per 3/7 los

## 2017-12-04 ENCOUNTER — Other Ambulatory Visit: Payer: Self-pay | Admitting: Internal Medicine

## 2017-12-04 DIAGNOSIS — G4700 Insomnia, unspecified: Secondary | ICD-10-CM

## 2017-12-04 DIAGNOSIS — G4730 Sleep apnea, unspecified: Principal | ICD-10-CM

## 2017-12-05 ENCOUNTER — Encounter: Payer: Self-pay | Admitting: Internal Medicine

## 2017-12-05 ENCOUNTER — Ambulatory Visit (INDEPENDENT_AMBULATORY_CARE_PROVIDER_SITE_OTHER): Payer: Medicare Other | Admitting: Internal Medicine

## 2017-12-05 VITALS — BP 126/68 | HR 69 | Temp 97.7°F | Resp 16 | Ht 73.0 in | Wt 215.0 lb

## 2017-12-05 DIAGNOSIS — J449 Chronic obstructive pulmonary disease, unspecified: Secondary | ICD-10-CM

## 2017-12-05 DIAGNOSIS — J301 Allergic rhinitis due to pollen: Secondary | ICD-10-CM

## 2017-12-05 DIAGNOSIS — R7303 Prediabetes: Secondary | ICD-10-CM | POA: Diagnosis not present

## 2017-12-05 DIAGNOSIS — I429 Cardiomyopathy, unspecified: Secondary | ICD-10-CM | POA: Diagnosis not present

## 2017-12-05 DIAGNOSIS — Z7252 High risk homosexual behavior: Secondary | ICD-10-CM | POA: Diagnosis not present

## 2017-12-05 DIAGNOSIS — K635 Polyp of colon: Secondary | ICD-10-CM | POA: Insufficient documentation

## 2017-12-05 MED ORDER — CETIRIZINE HCL 10 MG PO TABS: 10.0000 mg | ORAL_TABLET | Freq: Every day | ORAL | 1 refills | Status: DC

## 2017-12-05 MED ORDER — UMECLIDINIUM-VILANTEROL 62.5-25 MCG/INH IN AEPB: 1.0000 | INHALATION_SPRAY | Freq: Every day | RESPIRATORY_TRACT | 1 refills | Status: DC

## 2017-12-05 NOTE — Progress Notes (Signed)
Subjective:  Patient ID: [name], male    DOB: 1961-08-04  Age: 57 y.o. MRN: [id number]  CC: COPD   HPI [name] presents for f/up - He complains of a chronic cough that is occasionally productive of clear phlegm with DOE and wheezing.  He tells me that he saw a pulmonologist in the last year and was told to use [facility] which he tried but it did not help.  He is using his Ventolin inhaler frequently.  He continues to be sexually active and is concerned about HIV and wants to continue taking PrEP.  He quit drinking about 3 months ago and has abstained during that time.  He is committed to being a clean and sober person.  He has a history of cardiomyopathy and wants to follow-up with his cardiologist.  Outpatient Medications Prior to Visit  Medication Sig Dispense Refill  . carvedilol (COREG) 12.5 MG tablet Take 1 tablet (12.5 mg total) by mouth 2 (two) times daily with a meal. 180 tablet 1  . ciclopirox (PENLAC) 8 % solution APPLY topically AT BEDTIME. APPLY ALL over NAIL FOLD fold & surrounding SKIN. APPLY daily over prevoius coat, AFTER SEVEN DAYS REMOVE with a 6.6 mL 2  . diclofenac sodium (VOLTAREN) 1 % GEL Apply 2 g topically 4 (four) times daily. 100 g 5  . emtricitabine-tenofovir (TRUVADA) 200-300 MG tablet Take 1 tablet by mouth daily. 90 tablet 1  . fluticasone (FLONASE) 50 MCG/ACT nasal spray Place 1 spray into both nostrils daily for 7 days. 1 g 0  . pravastatin (PRAVACHOL) 40 MG tablet Take 1 tablet (40 mg total) by mouth daily. 90 tablet 1  . traZODone (DESYREL) 100 MG tablet TAKE THREE TABLETS BY MOUTH AT BEDTIME 270 tablet 1  . VENTOLIN HFA 108 (90 Base) MCG/ACT inhaler Inhale 1-2 puffs into the lungs every 6 (six) hours as needed for wheezing or shortness of breath. 18 g 5  . Vilazodone HCl (VIIBRYD) 40 MG TABS Take 1 tablet (40 mg total) by mouth daily. 90 tablet 3  . cetirizine (ZYRTEC) 10 MG tablet Take 1 tablet (10 mg total) by mouth daily for 15 days. 15  tablet 1  . esomeprazole (NEXIUM) 40 MG capsule Take 1 capsule (40 mg total) by mouth every morning. 90 capsule 1  . Eszopiclone 3 MG TABS TAKE ONE TABLET BY MOUTH AT BEDTIME. Take immediately BEFORE bedtime 30 tablet 2  . losartan (COZAAR) 25 MG tablet Take 1 tablet (25 mg total) daily by mouth. 30 tablet 0   No facility-administered medications prior to visit.     ROS Review of Systems  Constitutional: Negative for chills, diaphoresis, fatigue and fever.  HENT: Positive for congestion, postnasal drip, rhinorrhea and sneezing. Negative for nosebleeds, sinus pressure, sinus pain, sore throat, trouble swallowing and voice change.   Eyes: Negative.   Respiratory: Positive for cough, shortness of breath and wheezing. Negative for chest tightness and stridor.   Cardiovascular: Negative for chest pain, palpitations and leg swelling.  Gastrointestinal: Negative for abdominal pain, constipation, diarrhea, nausea and vomiting.  Endocrine: Negative.   Genitourinary: Negative.  Negative for difficulty urinating, discharge, dysuria, genital sores, hematuria, penile pain, penile swelling, scrotal swelling and testicular pain.  Musculoskeletal: Negative.  Negative for arthralgias, back pain, myalgias and neck pain.  Skin: Negative.  Negative for color change, pallor and rash.  Allergic/Immunologic: Negative.   Neurological: Negative.  Negative for dizziness, weakness and light-headedness.  Hematological: Negative for adenopathy. Does not bruise/bleed  easily.  Psychiatric/Behavioral: Negative.  Negative for decreased concentration, dysphoric mood, sleep disturbance and suicidal ideas. The patient is not nervous/anxious.     Objective:  BP 126/68   Pulse 69   Temp 97.7 F (36.5 C) (Oral)   Resp 16   Ht 6\' 1"  (1.854 m)   Wt 215 lb (97.5 kg)   SpO2 97%   BMI 28.37 kg/m   BP Readings from Last 3 Encounters:  12/05/17 126/68  11/30/17 128/72  11/09/17 134/66    Wt Readings from Last 3  Encounters:  12/05/17 215 lb (97.5 kg)  11/30/17 211 lb 3.2 oz (95.8 kg)  11/09/17 209 lb (94.8 kg)    Physical Exam  Constitutional: He is oriented to person, place, and time. No distress.  HENT:  Mouth/Throat: Oropharynx is clear and moist. No oropharyngeal exudate.  Eyes: Conjunctivae are normal. Left eye exhibits no discharge. No scleral icterus.  Neck: Normal range of motion. Neck supple. No JVD present. No thyromegaly present.  Cardiovascular: Normal rate, regular rhythm and normal heart sounds. Exam reveals no gallop and no friction rub.  No murmur heard. Pulmonary/Chest: Effort normal and breath sounds normal. No respiratory distress. He has no decreased breath sounds. He has no wheezes. He has no rhonchi. He has no rales.  Abdominal: Soft. Bowel sounds are normal. He exhibits no distension and no mass. There is no guarding.  Musculoskeletal: Normal range of motion. He exhibits no edema, tenderness or deformity.  Lymphadenopathy:    He has no cervical adenopathy.  Neurological: He is alert and oriented to person, place, and time.  Skin: Skin is warm and dry. No rash noted. He is not diaphoretic. No erythema. No pallor.  Vitals reviewed.   Lab Results  Component Value Date   WBC 6.4 11/23/2017   HGB 15.4 11/23/2017   HCT 45.5 11/23/2017   PLT 98 (L) 11/23/2017   GLUCOSE 103 11/23/2017   CHOL 142 10/04/2017   TRIG 129.0 10/04/2017   HDL 31.90 (L) 10/04/2017   LDLDIRECT 122.0 11/30/2016   LDLCALC 84 10/04/2017   ALT 22 11/23/2017   AST 18 11/23/2017   NA 142 11/23/2017   K 4.6 11/23/2017   CL 111 (H) 11/23/2017   CREATININE 0.86 11/23/2017   BUN 10 11/23/2017   CO2 23 11/23/2017   TSH 0.56 11/30/2016   PSA 0.60 11/30/2016   INR 1.03 08/26/2015   HGBA1C 6.2 12/07/2017   MICROALBUR 0.2 10/08/2008    Ct Chest W Contrast  Result Date: 11/27/2017 CLINICAL DATA:  Non-small cell lung cancer EXAM: CT CHEST WITH CONTRAST TECHNIQUE: Multidetector CT imaging of the chest  was performed during intravenous contrast administration. CONTRAST:  75 mL Isovue COMPARISON:  04/20/2017 FINDINGS: CT CHEST FINDINGS Cardiovascular: No significant vascular findings. Normal heart size. No pericardial effusion. Mediastinum/Nodes: No axillary or supraclavicular adenopathy. No mediastinal or hilar adenopathy. No pericardial effusion. Esophagus normal. Lungs/Pleura: postsurgical change in the RIGHT upper lobe. Linear scarring along the resection margin. No suspicious nodularity. Upper abdomen: Limited view of the liver, kidneys, pancreas are unremarkable. Normal adrenal glands. Musculoskeletal: No aggressive osseous lesion. IMPRESSION: 1. Postsurgical change in RIGHT upper lobe. 2. No evidence of lung cancer recurrence or metastasis Electronically Signed   By: Suzy Bouchard M.D.   On: 11/27/2017 08:56    Assessment & Plan:   [name] was seen today for copd.  Diagnoses and all orders for this visit:  COPD GOLD II if use fev1/VC and still smoking - I think he  will benefit from using a LAMA/LABA combination inhaler. -     umeclidinium-vilanterol (ANORO ELLIPTA) 62.5-25 MCG/INH AEPB; Inhale 1 puff into the lungs daily.  Secondary cardiomyopathy ([place]) -     Ambulatory referral to Cardiology  Polyp of colon, unspecified part of colon, unspecified type -     Ambulatory referral to Gastroenterology  High risk homosexual behavior- Screening for HIV and Hep B is negative.  He can continue taking Truvada. -     Hepatitis B surface antigen; Future -     HIV antibody; Future  Prediabetes- His A1c is at 6.2%.  His blood sugars are adequately well controlled.  Medical therapy is not indicated. -     Hemoglobin A1c; Future  Allergic rhinitis due to pollen -     cetirizine (ZYRTEC) 10 MG tablet; Take 1 tablet (10 mg total) by mouth daily.   I have discontinued Riaz T. Blalock's losartan and Eszopiclone. I have also changed his cetirizine. Additionally, I am having him start on  umeclidinium-vilanterol. Lastly, I am having him maintain his Vilazodone HCl, VENTOLIN HFA, carvedilol, pravastatin, emtricitabine-tenofovir, diclofenac sodium, ciclopirox, traZODone, and fluticasone.  Meds ordered this encounter  Medications  . cetirizine (ZYRTEC) 10 MG tablet    Sig: Take 1 tablet (10 mg total) by mouth daily.    Dispense:  90 tablet    Refill:  1  . umeclidinium-vilanterol (ANORO ELLIPTA) 62.5-25 MCG/INH AEPB    Sig: Inhale 1 puff into the lungs daily.    Dispense:  90 each    Refill:  1     Follow-up: Return in about 3 months (around 03/07/2018).  Scarlette Calico, MD

## 2017-12-05 NOTE — Patient Instructions (Signed)

## 2017-12-06 ENCOUNTER — Encounter: Payer: Self-pay | Admitting: Internal Medicine

## 2017-12-07 ENCOUNTER — Other Ambulatory Visit: Payer: Self-pay | Admitting: Internal Medicine

## 2017-12-07 ENCOUNTER — Other Ambulatory Visit (INDEPENDENT_AMBULATORY_CARE_PROVIDER_SITE_OTHER): Payer: Self-pay

## 2017-12-07 DIAGNOSIS — Z7252 High risk homosexual behavior: Secondary | ICD-10-CM

## 2017-12-07 DIAGNOSIS — R7303 Prediabetes: Secondary | ICD-10-CM

## 2017-12-07 DIAGNOSIS — K279 Peptic ulcer, site unspecified, unspecified as acute or chronic, without hemorrhage or perforation: Secondary | ICD-10-CM

## 2017-12-07 LAB — HEMOGLOBIN A1C: Hgb A1c MFr Bld: 6.2 % (ref 4.6–6.5)

## 2017-12-08 LAB — HEPATITIS B SURFACE ANTIGEN: Hepatitis B Surface Ag: NONREACTIVE

## 2017-12-08 LAB — HIV ANTIBODY (ROUTINE TESTING W REFLEX): HIV 1&2 Ab, 4th Generation: NONREACTIVE

## 2017-12-09 ENCOUNTER — Encounter: Payer: Self-pay | Admitting: Internal Medicine

## 2017-12-10 ENCOUNTER — Encounter: Payer: Self-pay | Admitting: Internal Medicine

## 2017-12-11 ENCOUNTER — Other Ambulatory Visit: Payer: Self-pay | Admitting: Internal Medicine

## 2017-12-11 DIAGNOSIS — F17210 Nicotine dependence, cigarettes, uncomplicated: Secondary | ICD-10-CM

## 2017-12-11 MED ORDER — VARENICLINE TARTRATE 1 MG PO TABS: 1.0000 mg | ORAL_TABLET | Freq: Two times a day (BID) | ORAL | 3 refills | Status: DC

## 2017-12-11 MED ORDER — VARENICLINE TARTRATE 0.5 MG X 11 & 1 MG X 42 PO MISC: ORAL | 0 refills | Status: DC

## 2017-12-18 ENCOUNTER — Other Ambulatory Visit: Payer: Self-pay | Admitting: Internal Medicine

## 2017-12-18 ENCOUNTER — Encounter: Payer: Self-pay | Admitting: Internal Medicine

## 2017-12-18 DIAGNOSIS — H40003 Preglaucoma, unspecified, bilateral: Secondary | ICD-10-CM

## 2017-12-26 ENCOUNTER — Encounter (INDEPENDENT_AMBULATORY_CARE_PROVIDER_SITE_OTHER): Payer: Self-pay | Admitting: Orthopaedic Surgery

## 2017-12-26 ENCOUNTER — Encounter: Payer: Self-pay | Admitting: Internal Medicine

## 2017-12-26 ENCOUNTER — Other Ambulatory Visit: Payer: Self-pay | Admitting: Internal Medicine

## 2017-12-26 DIAGNOSIS — G4733 Obstructive sleep apnea (adult) (pediatric): Secondary | ICD-10-CM

## 2017-12-28 ENCOUNTER — Ambulatory Visit (INDEPENDENT_AMBULATORY_CARE_PROVIDER_SITE_OTHER): Payer: Self-pay | Admitting: Orthopaedic Surgery

## 2017-12-29 IMAGING — CR DG CHEST 2V
2 series · 2 of 2 positions shown · non-contrast
Comparison: 09/09/2016, 01/18/2016

CLINICAL DATA: Weakness

EXAM:
CHEST  2 VIEW

[w chest pa]
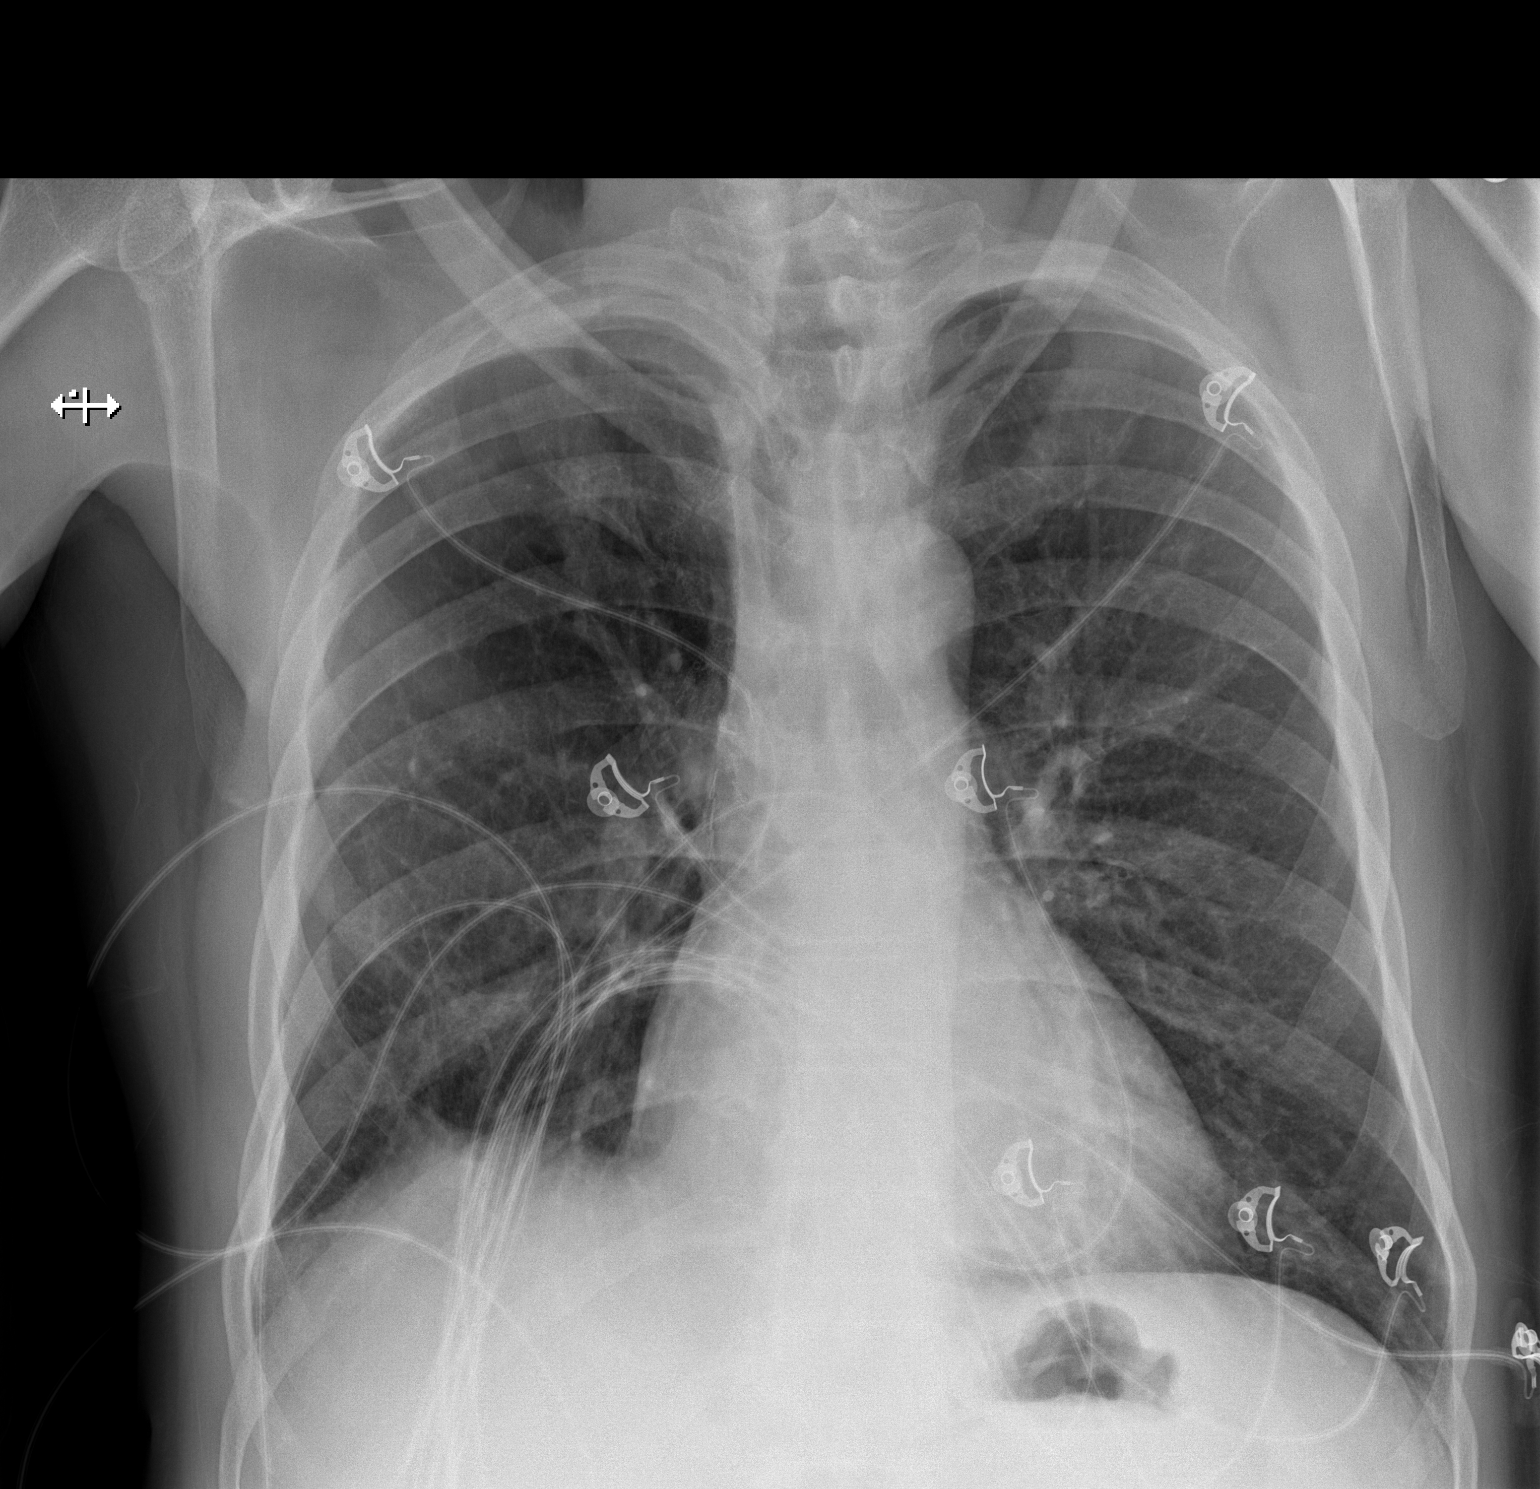

[w chest lat]
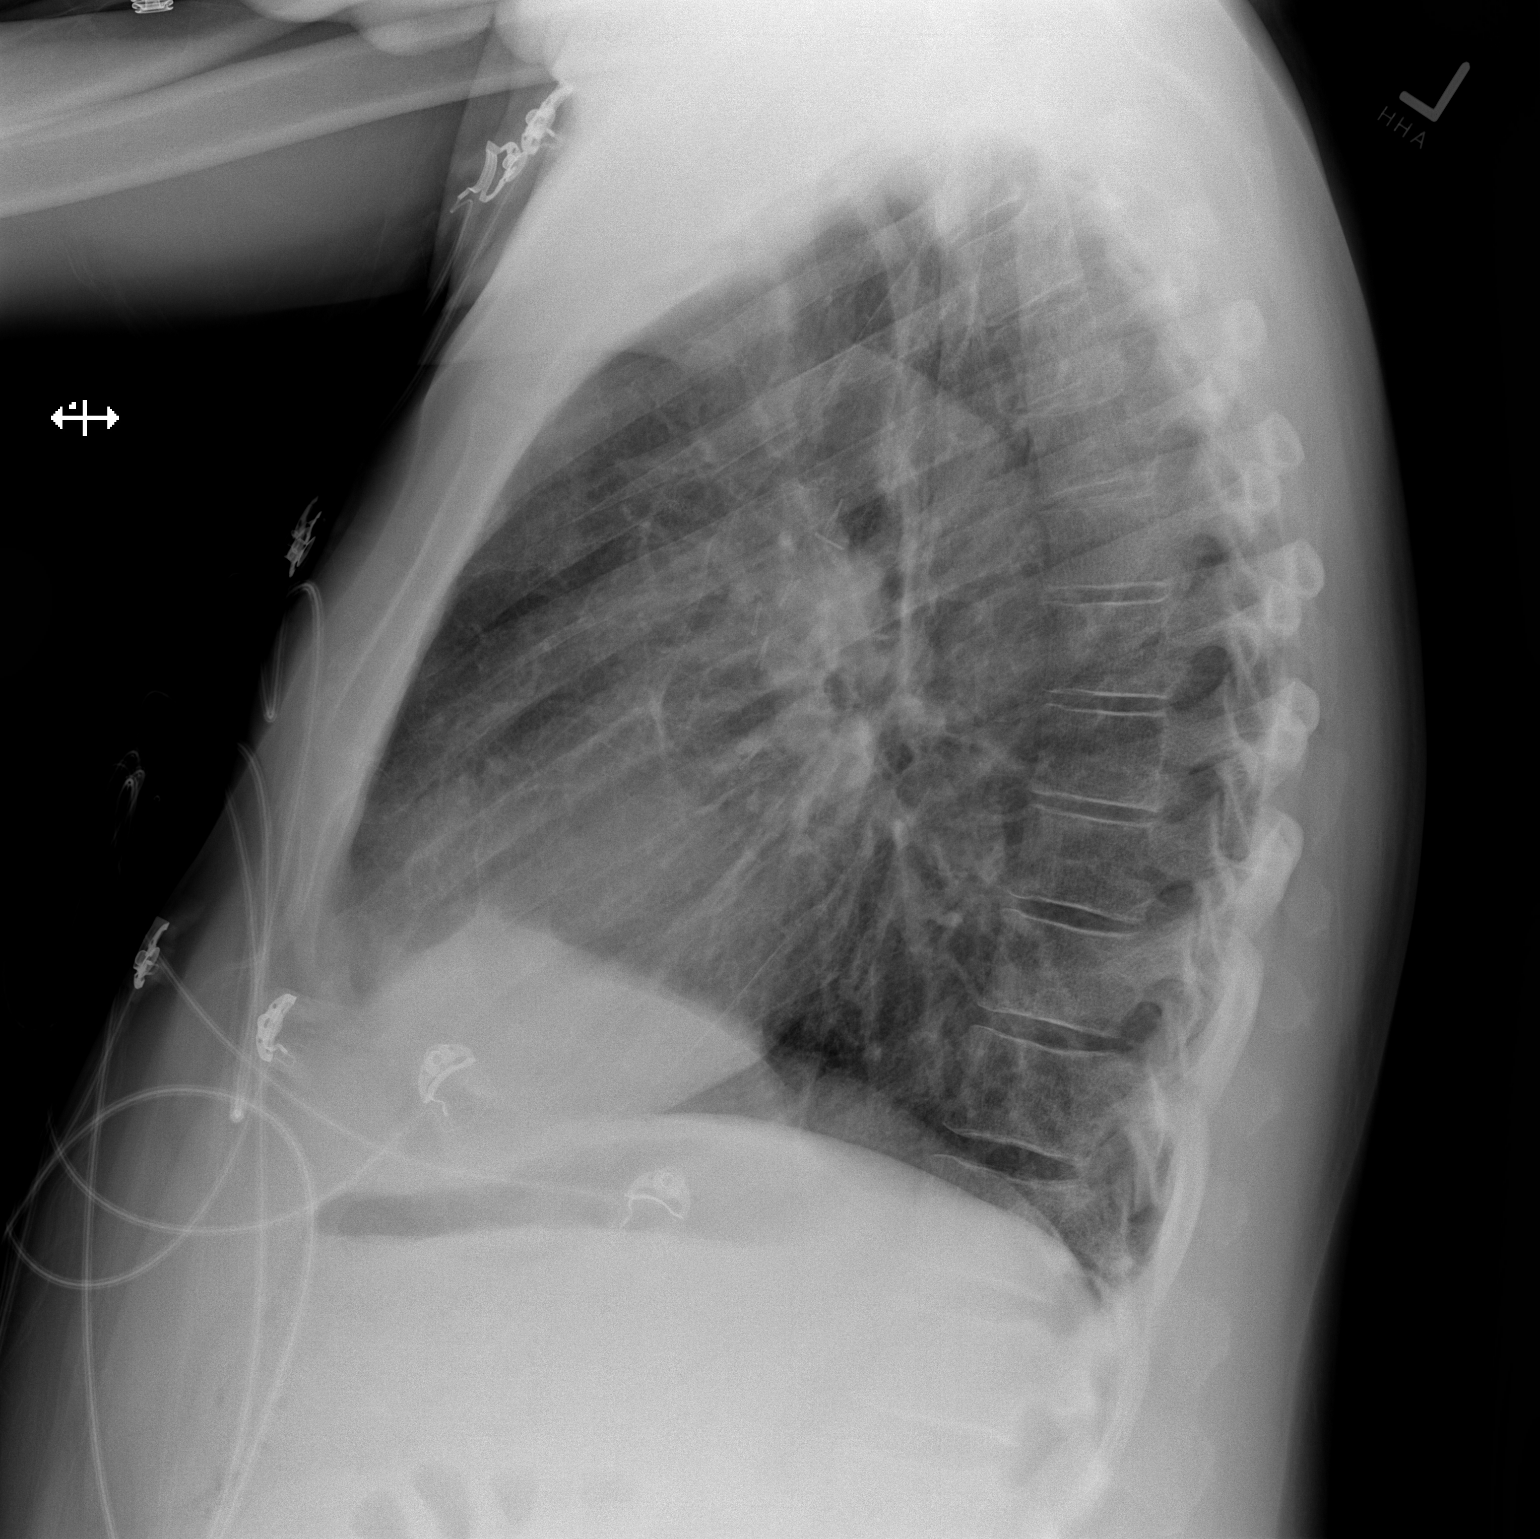

[2 of 2 positions shown; findings below may reference images not displayed]

FINDINGS: Small focal opacity at the right lung base, favor atelectasis.
Stable postsurgical changes in the right hilum. The left lung is
clear. No acute infiltrate or effusion. Heart size normal. No
pneumothorax.
IMPRESSION: Small focal opacity at the right lung base, favor atelectasis.
Otherwise no radiographic evidence for acute cardiopulmonary
abnormality.

## 2017-12-31 IMAGING — DX DG CHEST 2V
2 series · 2 of 2 positions shown · non-contrast
Comparison: [DATE]

CLINICAL DATA: History of right lung cancer.

EXAM:
CHEST  2 VIEW

[dg chest 2 view (1 of 2)]
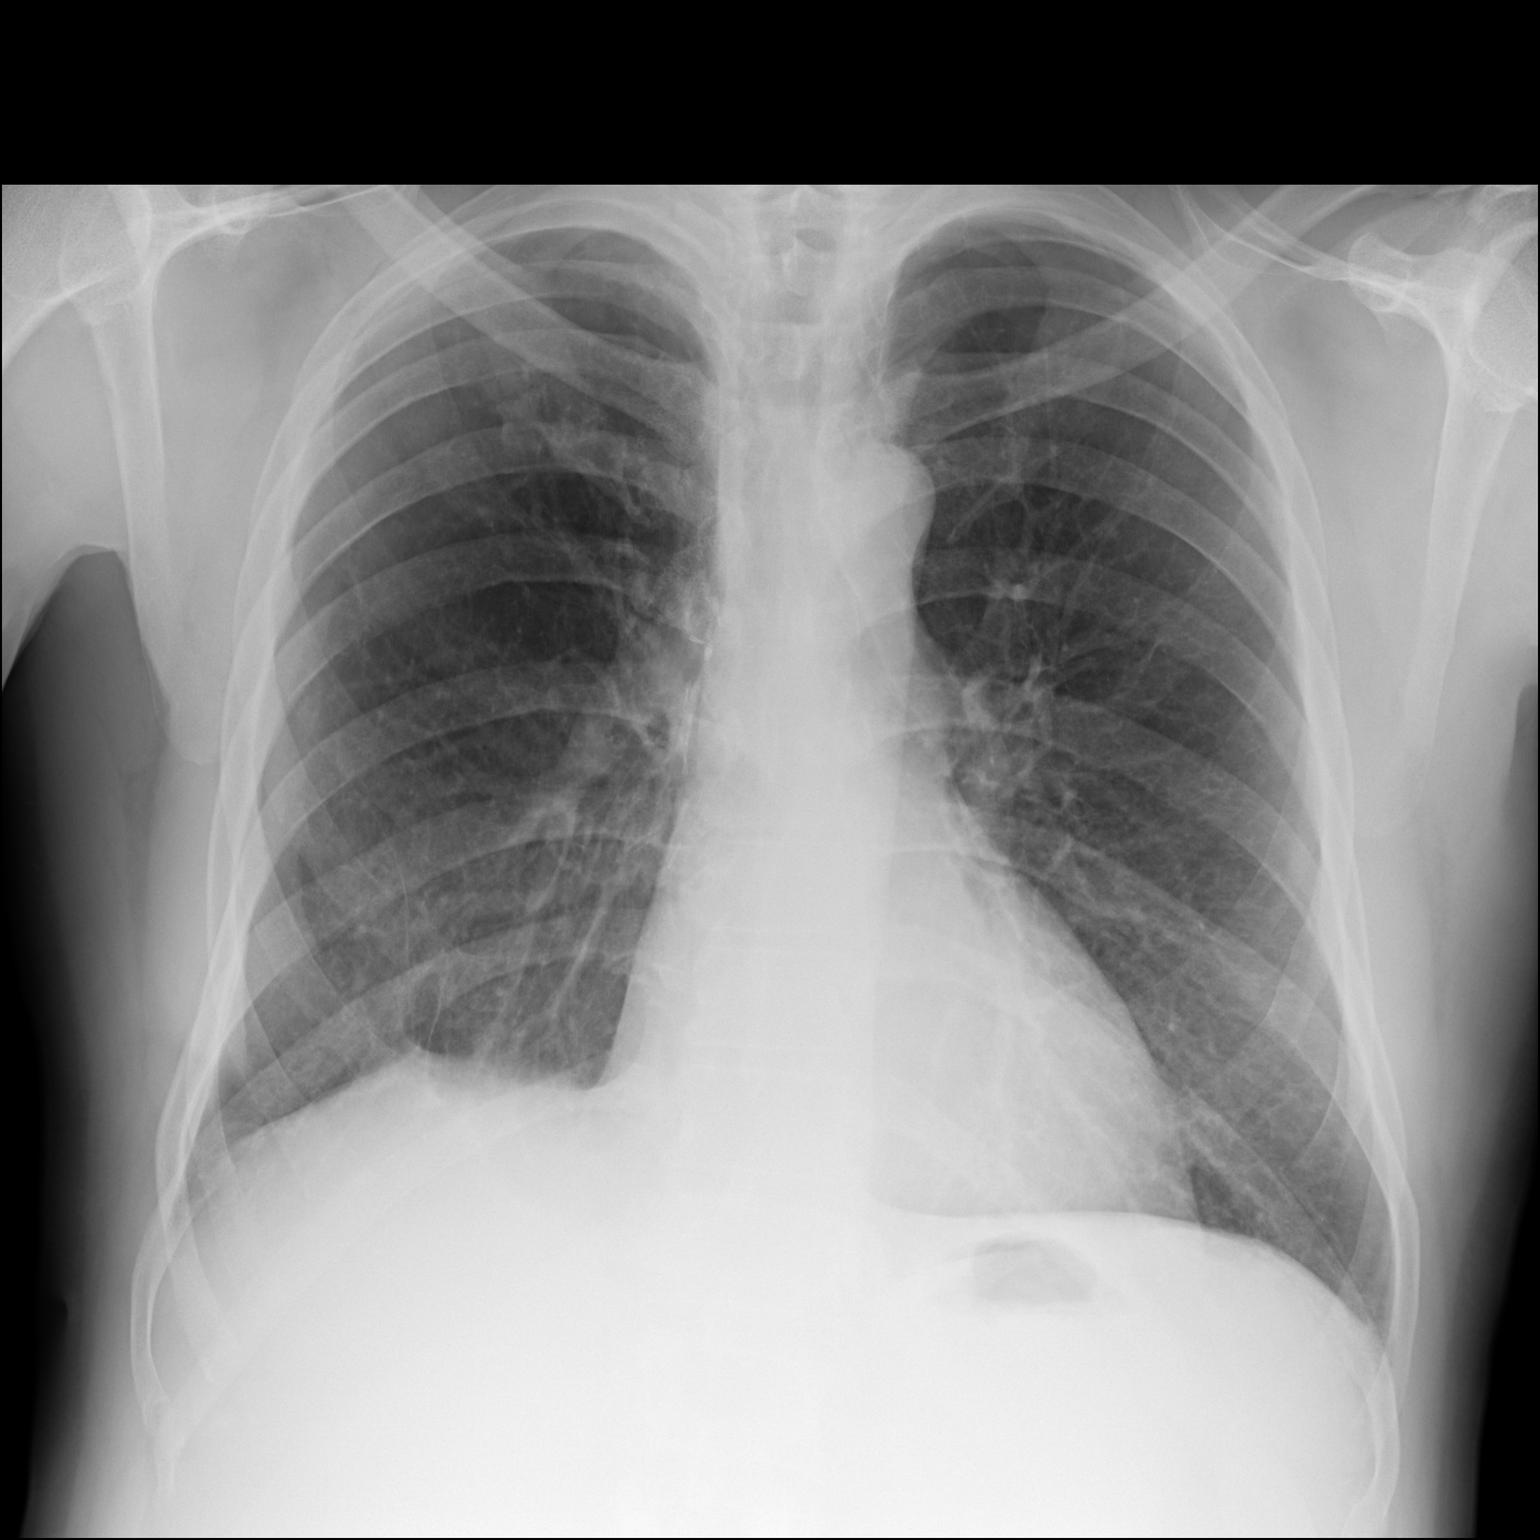

[dg chest 2 view (2 of 2)]
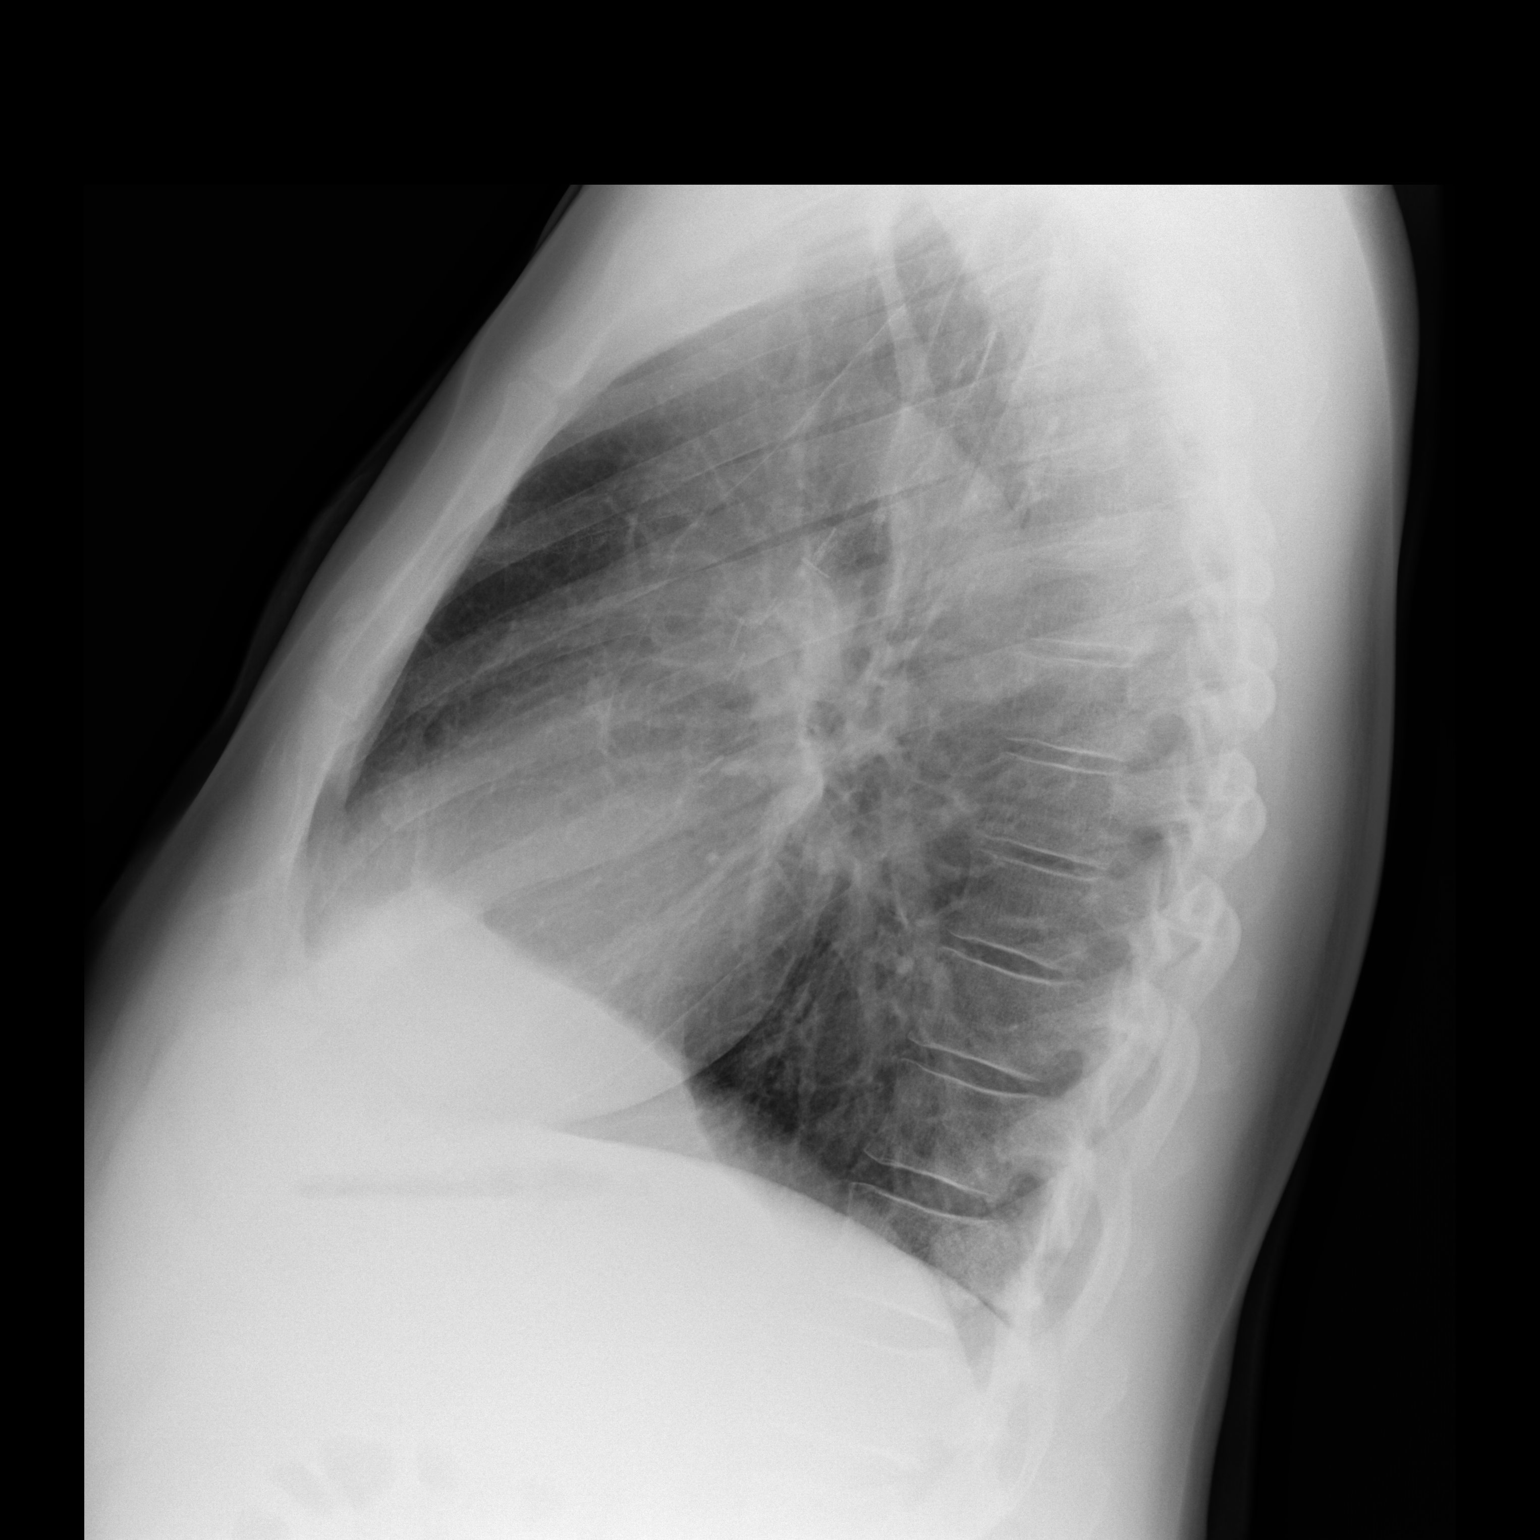

[2 of 2 positions shown; findings below may reference images not displayed]

FINDINGS: The heart size and mediastinal contours are within normal limits.
Scarring noted in the right base. The visualized skeletal structures
are unremarkable.
IMPRESSION: 1. No acute cardio pulmonary abnormalities.
2. Right base scarring.

## 2018-01-01 ENCOUNTER — Other Ambulatory Visit: Payer: Self-pay | Admitting: Podiatry

## 2018-01-01 ENCOUNTER — Telehealth: Payer: Self-pay | Admitting: Internal Medicine

## 2018-01-01 ENCOUNTER — Other Ambulatory Visit: Payer: Self-pay | Admitting: Internal Medicine

## 2018-01-01 DIAGNOSIS — J449 Chronic obstructive pulmonary disease, unspecified: Secondary | ICD-10-CM

## 2018-01-01 NOTE — Telephone Encounter (Signed)
Called and spoke to pt.  Pt had questions regarding being measured for cpap mask. I have made pt aware that order was placed to [facility] and that [facility] will contact him regarding fitting. Pt is aware and voiced his understanding. Nothing further is needed.

## 2018-01-02 ENCOUNTER — Ambulatory Visit: Payer: Self-pay | Admitting: Internal Medicine

## 2018-01-03 ENCOUNTER — Encounter: Payer: Self-pay | Admitting: Cardiovascular Disease

## 2018-01-03 DIAGNOSIS — G4733 Obstructive sleep apnea (adult) (pediatric): Secondary | ICD-10-CM | POA: Diagnosis not present

## 2018-01-03 DIAGNOSIS — R269 Unspecified abnormalities of gait and mobility: Secondary | ICD-10-CM | POA: Diagnosis not present

## 2018-01-03 DIAGNOSIS — J449 Chronic obstructive pulmonary disease, unspecified: Secondary | ICD-10-CM | POA: Diagnosis not present

## 2018-01-09 ENCOUNTER — Ambulatory Visit (INDEPENDENT_AMBULATORY_CARE_PROVIDER_SITE_OTHER): Payer: Medicare Other | Admitting: Cardiovascular Disease

## 2018-01-09 ENCOUNTER — Encounter: Payer: Self-pay | Admitting: Internal Medicine

## 2018-01-09 ENCOUNTER — Encounter: Payer: Self-pay | Admitting: Cardiovascular Disease

## 2018-01-09 VITALS — BP 92/52 | HR 76 | Ht 75.0 in | Wt 214.0 lb

## 2018-01-09 DIAGNOSIS — I739 Peripheral vascular disease, unspecified: Secondary | ICD-10-CM

## 2018-01-09 DIAGNOSIS — I10 Essential (primary) hypertension: Secondary | ICD-10-CM | POA: Diagnosis not present

## 2018-01-09 DIAGNOSIS — I5022 Chronic systolic (congestive) heart failure: Secondary | ICD-10-CM
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
## 2018-01-09 NOTE — Patient Instructions (Signed)
Medication Instructions:  Your physician recommends that you continue on your current medications as directed. Please refer to the Current Medication list given to you today.   Labwork: None Ordered   Testing/Procedures: None Ordered   Follow-Up: Your physician wants you to follow-up in: 1 year with Dr. Nahser.  You will receive a reminder letter in the mail two months in advance. If you don't receive a letter, please call our office to schedule the follow-up appointment.   If you need a refill on your cardiac medications before your next appointment, please call your pharmacy.   Thank you for choosing [facility]! Michelle Swinyer, RN [phone]    

## 2018-01-10 ENCOUNTER — Other Ambulatory Visit: Payer: Self-pay | Admitting: Internal Medicine

## 2018-01-10 DIAGNOSIS — F418 Other specified anxiety disorders: Secondary | ICD-10-CM

## 2018-01-10 MED ORDER — TRAZODONE HCL 100 MG PO TABS: 300.0000 mg | ORAL_TABLET | Freq: Every day | ORAL | 1 refills | Status: DC

## 2018-01-10 NOTE — Addendum Note (Signed)
Addended by: De Burrs on: 01/10/2018 10:24 AM   Modules accepted: Orders

## 2018-01-16 DIAGNOSIS — J449 Chronic obstructive pulmonary disease, unspecified: Secondary | ICD-10-CM | POA: Diagnosis not present

## 2018-01-16 DIAGNOSIS — R269 Unspecified abnormalities of gait and mobility: Secondary | ICD-10-CM | POA: Diagnosis not present

## 2018-01-16 DIAGNOSIS — G4733 Obstructive sleep apnea (adult) (pediatric): Secondary | ICD-10-CM | POA: Diagnosis not present

## 2018-01-22 ENCOUNTER — Encounter: Payer: Self-pay | Admitting: Internal Medicine

## 2018-01-26 DIAGNOSIS — H2513 Age-related nuclear cataract, bilateral: Secondary | ICD-10-CM | POA: Diagnosis not present

## 2018-01-26 DIAGNOSIS — H40013 Open angle with borderline findings, low risk, bilateral: Secondary | ICD-10-CM | POA: Diagnosis not present

## 2018-01-30 ENCOUNTER — Telehealth: Payer: Self-pay | Admitting: *Deleted

## 2018-01-30 NOTE — Telephone Encounter (Signed)
Based upon my chart review he is technically ok for LEC  EF > 35 Tracheostomy is gone Not on oxygen Did not have any problems with intubation for his lung surgery  Await John's review

## 2018-01-30 NOTE — Telephone Encounter (Signed)
Dr. Carlean Purl,  I reviewed this patient's chart for upcoming pre-visit on 02/08/18 and Colonoscopy 02/22/18.  I noticed that he had a recent Echo on 08/13/17 and it showed EF of 35-40 Percent.  He has Paranoid schizophrenia and diagnosis of chronic CHF.  He was diagnosed with Lung Cancer,  and had partial pneumonectomy. Also respiratory arrest-s/p tracheostomy with obstructive airway.  He is a recall colonoscopy with history of polyps/FHCC (mother). Will you please advise?  Thank you so much.  I will send to Osvaldo Angst also.  B.Story Vanvranken, CMA  PV

## 2018-01-31 ENCOUNTER — Ambulatory Visit: Payer: Self-pay | Admitting: Internal Medicine

## 2018-01-31 NOTE — Telephone Encounter (Signed)
Noted! Thank you

## 2018-01-31 NOTE — Telephone Encounter (Signed)
[name],  This pt is cleared for anesthetic care at [facility].  Thanks,  Osvaldo Angst

## 2018-02-02 DIAGNOSIS — G4733 Obstructive sleep apnea (adult) (pediatric): Secondary | ICD-10-CM | POA: Diagnosis not present

## 2018-02-03 ENCOUNTER — Other Ambulatory Visit: Payer: Self-pay | Admitting: Internal Medicine

## 2018-02-05 ENCOUNTER — Other Ambulatory Visit: Payer: Self-pay | Admitting: Internal Medicine

## 2018-02-05 DIAGNOSIS — E785 Hyperlipidemia, unspecified: Secondary | ICD-10-CM

## 2018-02-05 DIAGNOSIS — I739 Peripheral vascular disease, unspecified: Secondary | ICD-10-CM

## 2018-02-08 ENCOUNTER — Ambulatory Visit (AMBULATORY_SURGERY_CENTER): Payer: Self-pay

## 2018-02-08 ENCOUNTER — Other Ambulatory Visit: Payer: Self-pay

## 2018-02-08 VITALS — Ht 75.0 in | Wt 221.8 lb

## 2018-02-08 DIAGNOSIS — Z860100 Personal history of colon polyps, unspecified: Secondary | ICD-10-CM

## 2018-02-08 MED ORDER — NA SULFATE-K SULFATE-MG SULF 17.5-3.13-1.6 GM/177ML PO SOLN: 1.0000 | Freq: Once | ORAL | 0 refills | Status: AC

## 2018-02-08 NOTE — Progress Notes (Signed)
Denies allergies to eggs or soy products. Denies complication of anesthesia or sedation. Denies use of weight loss medication. Denies use of O2.   Emmi instructions declined.  Patient was given suprep for a bowel prep because Medicaid will pay for it.

## 2018-02-22 ENCOUNTER — Other Ambulatory Visit: Payer: Self-pay

## 2018-02-22 ENCOUNTER — Ambulatory Visit (AMBULATORY_SURGERY_CENTER): Payer: Medicare Other | Admitting: Internal Medicine

## 2018-02-22 ENCOUNTER — Encounter: Payer: Self-pay | Admitting: Internal Medicine

## 2018-02-22 VITALS — BP 124/73 | HR 73 | Temp 99.3°F | Resp 11 | Ht 75.0 in | Wt 214.0 lb

## 2018-02-22 DIAGNOSIS — D122 Benign neoplasm of ascending colon: Secondary | ICD-10-CM | POA: Diagnosis not present

## 2018-02-22 DIAGNOSIS — D123 Benign neoplasm of transverse colon: Secondary | ICD-10-CM

## 2018-02-22 DIAGNOSIS — Z86010 Personal history of colonic polyps: Secondary | ICD-10-CM

## 2018-02-22 DIAGNOSIS — D124 Benign neoplasm of descending colon: Secondary | ICD-10-CM

## 2018-02-22 LAB — HM COLONOSCOPY

## 2018-02-22 MED ORDER — SODIUM CHLORIDE 0.9 % IV SOLN: 500.0000 mL | Freq: Once | INTRAVENOUS | Status: DC

## 2018-02-22 NOTE — Op Note (Signed)
[place] Patient Name: [name] Procedure Date: 02/22/2018 9:17 AM MRN: [id number] Endoscopist: Gatha Mayer , MD Age: 57 Referring MD:  Date of Birth: 1961-03-02 Gender: Male Account #: [id number] Procedure:                Colonoscopy Indications:              Surveillance: Personal history of adenomatous                            polyps on last colonoscopy > 5 years ago Medicines:                Propofol per Anesthesia, Monitored Anesthesia Care Procedure:                Pre-Anesthesia Assessment:                           - Prior to the procedure, a History and Physical                            was performed, and patient medications and                            allergies were reviewed. The patient's tolerance of                            previous anesthesia was also reviewed. The risks                            and benefits of the procedure and the sedation                            options and risks were discussed with the patient.                            All questions were answered, and informed consent                            was obtained. Prior Anticoagulants: The patient has                            taken no previous anticoagulant or antiplatelet                            agents. ASA Grade Assessment: III - A patient with                            severe systemic disease. After reviewing the risks                            and benefits, the patient was deemed in                            satisfactory condition to undergo the procedure.  After obtaining informed consent, the colonoscope                            was passed under direct vision. Throughout the                            procedure, the patient's blood pressure, pulse, and                            oxygen saturations were monitored continuously. The                            Colonoscope was introduced through the anus and   advanced to the the cecum, identified by                            appendiceal orifice and ileocecal valve. The                            colonoscopy was performed without difficulty. The                            patient tolerated the procedure well. The quality                            of the bowel preparation was good. The ileocecal                            valve, appendiceal orifice, and rectum were                            photographed. The bowel preparation used was SUPREP. Scope In: 9:23:45 AM Scope Out: 9:46:10 AM Scope Withdrawal Time: 0 hours 19 minutes 13 seconds  Total Procedure Duration: 0 hours 22 minutes 25 seconds  Findings:                 The perianal and digital rectal examinations were                            normal.                           Seven sessile polyps were found in the descending                            colon, transverse colon and ascending colon. The                            polyps were 3 to 8 mm in size. These polyps were                            removed with a cold snare. Resection and retrieval  were complete. Verification of patient                            identification for the specimen was done. Estimated                            blood loss was minimal.                           A few diverticula were found in the sigmoid colon.                           Internal hemorrhoids were found during retroflexion.                           The exam was otherwise without abnormality on                            direct and retroflexion views. Complications:            No immediate complications. Estimated Blood Loss:     Estimated blood loss was minimal. Impression:               - Seven 3 to 8 mm polyps in the descending colon,                            in the transverse colon and in the ascending colon,                            removed with a cold snare. Resected and retrieved.                            - Diverticulosis in the sigmoid colon.                           - Internal hemorrhoids.                           - The examination was otherwise normal on direct                            and retroflexion views.                           - Personal history of colonic polyps. 2 polyps                            removed, maximum size 1 cm. TUBULAR ADENOMAS 2010                           9 adenomas removed in 11/2007 Recommendation:           - Patient has a contact number available for                            emergencies. The signs  and symptoms of potential                            delayed complications were discussed with the                            patient. Return to normal activities tomorrow.                            Written discharge instructions were provided to the                            patient.                           - Resume previous diet.                           - Continue present medications.                           - Repeat colonoscopy is recommended for                            surveillance. The colonoscopy date will be                            determined after pathology results from today's                            exam become available for review. Gatha Mayer, MD 02/22/2018 9:58:03 AM This report has been signed electronically.

## 2018-02-22 NOTE — Progress Notes (Signed)
Called to room to assist during endoscopic procedure.  Patient ID and intended procedure confirmed with present staff. Received instructions for my participation in the procedure from the performing physician.  

## 2018-02-22 NOTE — Progress Notes (Signed)
Report given to PACU, vss 

## 2018-02-22 NOTE — Patient Instructions (Addendum)
   I found and removed 7 polyps today - all look benign.  I will let you know pathology results and when to have another routine colonoscopy by mail and/or My Chart.  I appreciate the opportunity to care for you. Gatha Mayer, MD, [facility]   **Handouts given on polyps, diverticulosis, and hemorrhoids**   YOU HAD AN ENDOSCOPIC PROCEDURE TODAY: Refer to the procedure report and other information in the discharge instructions given to you for any specific questions about what was found during the examination. If this information does not answer your questions, please call [place] office at [phone] to clarify.   YOU SHOULD EXPECT: Some feelings of bloating in the abdomen. Passage of more gas than usual. Walking can help get rid of the air that was put into your GI tract during the procedure and reduce the bloating. If you had a lower endoscopy (such as a colonoscopy or flexible sigmoidoscopy) you may notice spotting of blood in your stool or on the toilet paper. Some abdominal soreness may be present for a day or two, also.  DIET: Your first meal following the procedure should be a light meal and then it is ok to progress to your normal diet. A half-sandwich or bowl of soup is an example of a good first meal. Heavy or fried foods are harder to digest and may make you feel nauseous or bloated. Drink plenty of fluids but you should avoid alcoholic beverages for 24 hours. If you had a esophageal dilation, please see attached instructions for diet.    ACTIVITY: Your care partner should take you home directly after the procedure. You should plan to take it easy, moving slowly for the rest of the day. You can resume normal activity the day after the procedure however YOU SHOULD NOT DRIVE, use power tools, machinery or perform tasks that involve climbing or major physical exertion for 24 hours (because of the sedation medicines used during the test).   SYMPTOMS TO REPORT IMMEDIATELY: A  gastroenterologist can be reached at any hour. Please call [phone]  for any of the following symptoms:  Following lower endoscopy (colonoscopy, flexible sigmoidoscopy) Excessive amounts of blood in the stool  Significant tenderness, worsening of abdominal pains  Swelling of the abdomen that is new, acute  Fever of 100 or higher    FOLLOW UP:  If any biopsies were taken you will be contacted by phone or by letter within the next 1-3 weeks. Call [phone]  if you have not heard about the biopsies in 3 weeks.  Please also call with any specific questions about appointments or follow up tests.

## 2018-02-23 ENCOUNTER — Telehealth: Payer: Self-pay

## 2018-02-23 ENCOUNTER — Encounter: Payer: Self-pay | Admitting: Podiatry

## 2018-02-23 ENCOUNTER — Ambulatory Visit (INDEPENDENT_AMBULATORY_CARE_PROVIDER_SITE_OTHER): Payer: Medicare Other | Admitting: Podiatry

## 2018-02-23 DIAGNOSIS — B351 Tinea unguium: Secondary | ICD-10-CM

## 2018-02-23 DIAGNOSIS — L84 Corns and callosities: Secondary | ICD-10-CM

## 2018-02-23 DIAGNOSIS — M79675 Pain in left toe(s): Secondary | ICD-10-CM | POA: Diagnosis not present

## 2018-02-23 DIAGNOSIS — M79674 Pain in right toe(s): Secondary | ICD-10-CM | POA: Diagnosis not present

## 2018-02-23 NOTE — Telephone Encounter (Signed)
  Follow up Call-  Call back number 02/22/2018  Post procedure Call Back phone  # [phone]  Permission to leave phone message Yes  Some recent data might be hidden     Patient questions:  Do you have a fever, pain , or abdominal swelling? No. Pain Score  0 *  Have you tolerated food without any problems? Yes.    Have you been able to return to your normal activities? Yes.    Do you have any questions about your discharge instructions: Diet   No. Medications  No. Follow up visit  No.  Do you have questions or concerns about your Care? No.  Actions: * If pain score is 4 or above: No action needed, pain <4.

## 2018-02-23 NOTE — Progress Notes (Signed)
This patient presents to the office with chief complaint of long thick nails and diabetic feet.  This patient  says there  is   pain and discomfort due to callus under big toe joint left foot..  This patient says there are long thick painful nails.  These nails are painful walking and wearing shoes.  Patient has no history of infection or drainage from both feet.  Patient is unable to  self treat his own nails . This patient presents  to the office today for treatment of the  long nails and a foot evaluation due to history of  Diabetes. He has been treated previously by Dr. Caffie Pinto.  General Appearance  Alert, conversant and in no acute stress.  Vascular  Dorsalis pedis and posterior tibial  pulses are palpable  bilaterally.  Capillary return is within normal limits  bilaterally. Temperature is within normal limits  bilaterally.  Neurologic  Senn-Weinstein monofilament wire test within normal limits  bilaterally. Muscle power within normal limits bilaterally.  Nails Thick disfigured discolored nails with subungual debris  from hallux to fifth toes bilaterally. No evidence of bacterial infection or drainage bilaterally.  Orthopedic  No limitations of motion of motion feet .  No crepitus or effusions noted.  No bony pathology or digital deformities noted.  Skin  normotropic skin  noted bilaterally.  No signs of infections or ulcers noted.   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  Onychomycosis  Diabetes with no foot complications  Callus left foot.  IE  Debride nails x 10.  A diabetic foot exam was performed and there is no evidence of any vascular or neurologic pathology.   RTC 3 months. Debridement of porokeratosis left forefoot.   Gardiner Barefoot DPM

## 2018-02-26 ENCOUNTER — Ambulatory Visit (INDEPENDENT_AMBULATORY_CARE_PROVIDER_SITE_OTHER): Payer: Medicare Other | Admitting: Internal Medicine

## 2018-02-26 ENCOUNTER — Encounter: Payer: Self-pay | Admitting: Internal Medicine

## 2018-02-26 VITALS — BP 126/74 | HR 77 | Ht 75.0 in | Wt 219.0 lb

## 2018-02-26 DIAGNOSIS — F17210 Nicotine dependence, cigarettes, uncomplicated: Secondary | ICD-10-CM | POA: Diagnosis not present

## 2018-02-26 DIAGNOSIS — J449 Chronic obstructive pulmonary disease, unspecified: Secondary | ICD-10-CM | POA: Diagnosis not present

## 2018-02-26 NOTE — Progress Notes (Signed)
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 Pulmonary Consult    Last seen 08/11/15 and was referred back by Dr Scarlette Calico. Pt c/o increased SOB for the past 6 months. He uses his albuterol inhaler 3-4 x per day. He also c/o non prod cough.  He states he gets SOB walking approx 20 ft. He also has a non prod cough. He is using   progressive doe x 6 m to point of room to room (note not reproducible here) assoc with dry cough neither symptom improves much with saba up to qid and can't sleep due to choking sensations after asleep for just an hour or so. rec Plan A = Automatic = stop anoro and restart Dulera 100 Take 2 puffs first thing in am and then another 2 puffs about 12 hours later.  Work on inhaler technique:  relax and gently blow all the way out then  Plan B = Backup Only use your albuterol (ventolin) as a rescue medication   Please schedule a follow up office visit in 4 weeks, sooner if needed  with all medications /inhalers/ solutions in hand so we can verify exactly what you are taking. This includes all medications from all doctors and over the counters - with PFTs on return     11/09/2017  f/u ov/Ponce Skillman re: GOLD 0 / still smoking - still on anoro / no meds as req  Chief Complaint  Patient presents with  . Follow-up     PFT, SOB w/activity and at rest,cough w/ beige sputum,wakes up at night w/ dry mouth,has never started dulera inhaler  Dyspnea:  Room to room if walks fast, better slow MMRC2 = can't walk a nl pace on a flat grade s sob but does fine slow and flat  Cough: mostly dry worse hs like choking  Sleep: disturbed by dry mouth/ gasping for air  saba still using q am  rec Plan A = Automatic = stop anoro and restart Dulera 100 Take 2 puffs first thing in am and then another 2 puffs about 12 hours later.  Work on inhaler technique:  Plan B = Backup Only use your albuterol (ventolin) as a rescue medication  The key is to stop smoking completely before smoking completely stops you!     02/26/2018  f/u ov/[place] Camps re:  Technically only GOLD 0 / still smoking  Chief Complaint  Patient presents with  . Follow-up    Breathing has improved some since he started pacing himself more.    Dyspnea:  MMRC2 = can't walk a nl pace on a flat grade s sob but does fine slow and flat eg  Cough:  Minimal  Sleep: doing  fine off cpap SABA use:  None  Rhinitis worse day > noct  Zyrtec no better  Not taking ppi ac as rec   No obvious day to day or daytime variability or assoc excess/ purulent sputum or mucus plugs or hemoptysis or cp or chest tightness, subjective wheeze or overt sinus or hb symptoms. No unusual exposure hx or h/o childhood pna/ asthma or knowledge of premature birth.  Sleeping  Ok without nocturnal  or early am exacerbation  of respiratory  c/o's or need for noct saba. Also denies any obvious fluctuation of symptoms with weather or environmental changes or other aggravating or alleviating factors except as outlined above   Current Allergies, Complete Past Medical History, Past Surgical History, Family History, and Social History were reviewed in [employer] record.  ROS  The following are not active complaints unless bolded Hoarseness, sore throat, dysphagia, dental problems, itching,  sneezing,  nasal congestion or discharge of excess mucus or purulent secretions, ear ache,   fever, chills, sweats, unintended wt loss or wt gain, classically pleuritic or exertional cp,  orthopnea pnd or arm/hand swelling  or leg swelling, presyncope, palpitations, abdominal pain, anorexia, nausea, vomiting, diarrhea  or change in bowel habits or change in bladder habits, change in stools or change in urine, dysuria, hematuria,  rash, arthralgias, visual complaints, headache, numbness, weakness or ataxia or problems with walking or coordination,  change in mood or  memory.        Current Meds  Medication Sig  . Albuterol Sulfate (PROAIR HFA IN) Inhale 2 puffs into the lungs every 4 (four) hours as needed.  . carvedilol (COREG) 12.5 MG tablet Take 1 tablet (12.5 mg total) by mouth 2 (two) times daily with a meal. Follow- appt due in June must see provider for future refills  . cetirizine (ZYRTEC) 10 MG tablet Take 10 mg by mouth daily.  . ciclopirox (PENLAC) 8 % solution APPLY topically AT BEDTIME. APPLY ALL over NAIL FOLD fold & surrounding SKIN. APPLY daily over prevoius coat, AFTER SEVEN DAYS REMOVE with a  . diclofenac sodium (VOLTAREN) 1 % GEL Apply 2 g topically 4 (four) times daily.  . DULERA 100-5 MCG/ACT AERO Inhale 2 puffs into the lungs 2 (two) times daily.   Marland Kitchen emtricitabine-tenofovir (TRUVADA) 200-300 MG tablet Take 1 tablet by mouth daily.  Marland Kitchen esomeprazole (NEXIUM) 40 MG capsule Take 1 capsule (40 mg total) by mouth every morning.  . fluticasone (FLONASE) 50 MCG/ACT nasal spray Place 2 sprays into both nostrils daily.  . Naproxen Sodium 220 MG CAPS Take 1 capsule by mouth daily.  . pravastatin (PRAVACHOL) 40 MG tablet Take 1 tablet (40 mg total) by mouth daily.  . traZODone (DESYREL) 100 MG tablet Take 3 tablets (300 mg total) by mouth at bedtime.  . Vilazodone HCl (VIIBRYD) 40 MG TABS Take 40 mg by mouth daily.                Objective:   Physical Exam   amb bm nad    11/09/2017        209   10/12/17 202 lb (91.6 kg)  10/09/17 204 lb 9.6 oz (92.8 kg)  10/03/17 201 lb (91.2 kg)      Vital signs reviewed - Note on arrival 02 sats  97% on RA   HEENT: nl dentition, turbinates bilaterally, and oropharynx. Nl external ear canals without cough reflex   NECK :  without JVD/Nodes/TM/ nl carotid upstrokes bilaterally   LUNGS:  no acc muscle use,  Nl contour chest which is clear to A and P bilaterally without cough on insp or exp maneuvers   CV:  RRR  no s3 or murmur or increase in P2, and no edema   ABD:  soft and nontender with nl inspiratory excursion in the supine position. No bruits or organomegaly appreciated, bowel sounds nl  MS:  Nl gait/ ext warm without deformities, calf tenderness, cyanosis or clubbing No obvious joint restrictions   SKIN: warm and dry without lesions    NEURO:  alert, approp, nl sensorium with  no motor or cerebellar deficits apparent.                      Assessment:

## 2018-02-26 NOTE — Patient Instructions (Addendum)
Nexium should be Take 30-60 min before first meal of the day    Work on inhaler technique:  relax and gently blow all the way out then take a nice smooth deep breath back in, triggering the inhaler at same time you start breathing in.  Hold for up to 5 seconds if you can. Blow out thru nose. Rinse and gargle with water when done   For drainage / throat tickle try take CHLORPHENIRAMINE  4 mg - take one every 4 hours as needed - available over the counter- may cause drowsiness so start with just a bedtime dose or two and see how you tolerate it before trying in daytime     The key is to stop smoking completely before smoking completely stops you!     Please schedule a follow up visit in 3 months but call sooner if needed

## 2018-02-27 ENCOUNTER — Encounter: Payer: Self-pay | Admitting: Internal Medicine

## 2018-02-27 DIAGNOSIS — H2513 Age-related nuclear cataract, bilateral: Secondary | ICD-10-CM | POA: Diagnosis not present

## 2018-02-27 DIAGNOSIS — H40013 Open angle with borderline findings, low risk, bilateral: Secondary | ICD-10-CM | POA: Diagnosis not present

## 2018-02-27 NOTE — Assessment & Plan Note (Signed)
-   pft's 10/28/09 FEV1 3.16 (70%) ratio 73 with air trapping and dloc 49% corrects to 69% for alv volume - 10/12/2017   Walked RA  2 laps @ 185 ft each stopped due to  Sob , no desats - PFT's  11/09/2017  FEV1 2.31 (66% ) ratio 69  p no % improvement from saba p anoro prior to study with DLCO  44 % corrects to 57 % for alv volume   If use fev1 2.36 vs VC 3.79 =  62%  With mild curvature confirming obst  - 11/09/2017    try duler 100 2bid   - 02/26/2018  After extensive coaching inhaler device  effectiveness =    75%   Technically only a GOLD 0 so main effort here should be on smoking cessation (see separate a/p)   His am sore throat is probably due to reflux and overall better off breo which tends to irritate throat more than hfa dulera esp in low doses so no change in rx needed    I had an extended discussion with the patient reviewing all relevant studies completed to date and  lasting 15 to 20 minutes of a 25 minute visit    See device teaching which extended face to face time for this visit.  Each maintenance medication was reviewed in detail including emphasizing most importantly the difference between maintenance and prns and under what circumstances the prns are to be triggered using an action plan format that is not reflected in the computer generated alphabetically organized AVS which I have not found useful in most complex patients, especially with respiratory illnesses  Please see AVS for specific instructions unique to this visit that I personally wrote and verbalized to the the pt in detail and then reviewed with pt  by my nurse highlighting any  changes in therapy recommended at today's visit to their plan of care.

## 2018-02-27 NOTE — Assessment & Plan Note (Signed)
4-5 min discussion re active cigarette smoking in addition to office E&M  Ask about tobacco use:  Active  Advise quitting  I took an extended  opportunity with this patient to outline the consequences of continued cigarette use  in airway disorders based on all the data we have from the multiple national lung health studies (perfomed over decades at millions of dollars in cost)  indicating that smoking cessation, not choice of inhalers or physicians, is the most important aspect of care.   Assess willingness not ready Assist in quit attempt per pcp when ready Arrange follow up. Per PCP

## 2018-03-02 ENCOUNTER — Telehealth: Payer: Self-pay | Admitting: Internal Medicine

## 2018-03-02 ENCOUNTER — Encounter: Payer: Self-pay | Admitting: Internal Medicine

## 2018-03-02 NOTE — Telephone Encounter (Signed)
The pt states he has had genetics testing in the past and is not interested in seeing anyone further.

## 2018-03-02 NOTE — Telephone Encounter (Signed)
Patient would like a call from the nurse to discuss results from colon on 5.30.19.

## 2018-03-02 NOTE — Progress Notes (Signed)
7 adenomas Recall 3 yrs 2021 18 cumulative adenomas Consider genetic testing  Will call from office and let him know recall and polyps benign but pre-cancerous and recommend genetics counseling - it copuld impact family members and how we follow him

## 2018-03-05 DIAGNOSIS — G4733 Obstructive sleep apnea (adult) (pediatric): Secondary | ICD-10-CM | POA: Diagnosis not present

## 2018-03-08 ENCOUNTER — Encounter (HOSPITAL_COMMUNITY): Payer: Self-pay | Admitting: Emergency Medicine

## 2018-03-08 ENCOUNTER — Ambulatory Visit (HOSPITAL_COMMUNITY)
Admission: EM | Admit: 2018-03-08 | Discharge: 2018-03-08 | Disposition: A | Discharge status: Home / Self Care | Payer: Medicare Other | Attending: Family Medicine | Admitting: Family Medicine

## 2018-03-08 ENCOUNTER — Other Ambulatory Visit: Payer: Self-pay

## 2018-03-08 DIAGNOSIS — M654 Radial styloid tenosynovitis [de Quervain]: Secondary | ICD-10-CM

## 2018-03-08 NOTE — ED Triage Notes (Signed)
Left hand pain and swelling.  Patient thinks he hurt his hand while in yoga class.

## 2018-03-08 NOTE — Discharge Instructions (Addendum)
You may continue to take ibuprofen 800mg  every 8 hours with food as needed.
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
## 2018-03-09 ENCOUNTER — Ambulatory Visit (HOSPITAL_COMMUNITY): Payer: Self-pay | Admitting: Psychiatry

## 2018-03-09 ENCOUNTER — Other Ambulatory Visit: Payer: Self-pay | Admitting: Internal Medicine

## 2018-03-09 DIAGNOSIS — M170 Bilateral primary osteoarthritis of knee: Secondary | ICD-10-CM

## 2018-03-09 DIAGNOSIS — J449 Chronic obstructive pulmonary disease, unspecified: Secondary | ICD-10-CM

## 2018-03-09 DIAGNOSIS — Z7251 High risk heterosexual behavior: Secondary | ICD-10-CM

## 2018-03-19 ENCOUNTER — Other Ambulatory Visit: Payer: Self-pay

## 2018-03-19 ENCOUNTER — Encounter (HOSPITAL_COMMUNITY): Payer: Self-pay | Admitting: Emergency Medicine

## 2018-03-19 ENCOUNTER — Emergency Department (HOSPITAL_COMMUNITY): Payer: Medicare Other

## 2018-03-19 ENCOUNTER — Emergency Department (HOSPITAL_COMMUNITY)
Admission: EM | Admit: 2018-03-19 | Discharge: 2018-03-19 | Disposition: A | Discharge status: Home / Self Care | Payer: Medicare Other | Attending: Emergency Medicine | Admitting: Emergency Medicine

## 2018-03-19 DIAGNOSIS — I110 Hypertensive heart disease with heart failure: Secondary | ICD-10-CM | POA: Diagnosis not present

## 2018-03-19 DIAGNOSIS — M65322 Trigger finger, left index finger: Secondary | ICD-10-CM

## 2018-03-19 DIAGNOSIS — I509 Heart failure, unspecified: Secondary | ICD-10-CM | POA: Insufficient documentation

## 2018-03-19 DIAGNOSIS — F17210 Nicotine dependence, cigarettes, uncomplicated: Secondary | ICD-10-CM | POA: Insufficient documentation

## 2018-03-19 DIAGNOSIS — Z79899 Other long term (current) drug therapy: Secondary | ICD-10-CM | POA: Diagnosis not present

## 2018-03-19 DIAGNOSIS — Z85118 Personal history of other malignant neoplasm of bronchus and lung: Secondary | ICD-10-CM | POA: Insufficient documentation

## 2018-03-19 DIAGNOSIS — M79642 Pain in left hand: Secondary | ICD-10-CM | POA: Diagnosis not present

## 2018-03-19 DIAGNOSIS — Z955 Presence of coronary angioplasty implant and graft: Secondary | ICD-10-CM | POA: Insufficient documentation

## 2018-03-19 DIAGNOSIS — M25532 Pain in left wrist: Secondary | ICD-10-CM
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
## 2018-03-19 NOTE — ED Triage Notes (Signed)
Patient complaining of left hand pain. Patient states he went to urgent care 3 days ago and they put a splint on it. Patient states the pain is getting worse.

## 2018-03-20 ENCOUNTER — Other Ambulatory Visit: Payer: Self-pay

## 2018-03-26 ENCOUNTER — Encounter: Payer: Self-pay | Admitting: Internal Medicine

## 2018-03-26 ENCOUNTER — Ambulatory Visit (INDEPENDENT_AMBULATORY_CARE_PROVIDER_SITE_OTHER): Payer: Medicare Other | Admitting: Internal Medicine

## 2018-03-26 VITALS — BP 128/68 | HR 71 | Temp 98.0°F | Resp 16 | Ht 75.0 in | Wt 222.8 lb

## 2018-03-26 DIAGNOSIS — Z7252 High risk homosexual behavior: Secondary | ICD-10-CM | POA: Diagnosis not present

## 2018-03-26 DIAGNOSIS — I10 Essential (primary) hypertension: Secondary | ICD-10-CM

## 2018-03-26 DIAGNOSIS — M170 Bilateral primary osteoarthritis of knee: Secondary | ICD-10-CM

## 2018-03-26 DIAGNOSIS — J301 Allergic rhinitis due to pollen: Secondary | ICD-10-CM

## 2018-03-26 DIAGNOSIS — A530 Latent syphilis, unspecified as early or late: Secondary | ICD-10-CM | POA: Diagnosis not present

## 2018-03-26 MED ORDER — TURMERIC 500 MG PO CAPS
1.0000 | ORAL_CAPSULE | Freq: Two times a day (BID) | ORAL | 1 refills | Status: DC
Start: 2018-03-26 — End: 2019-04-18

## 2018-03-26 MED ORDER — FLUTICASONE PROPIONATE 50 MCG/ACT NA SUSP: 2.0000 | Freq: Every day | NASAL | 1 refills | Status: DC

## 2018-03-26 NOTE — Patient Instructions (Signed)
Safe Sex  Practicing safe sex means taking steps before and during sex to reduce your risk of:   Getting an STD (sexually transmitted disease).   Giving your partner an STD.   Unwanted pregnancy.    How can I practice safe sex?    To practice safe sex:   Limit your sexual partners to only one partner who is having sex with only you.   Avoid using alcohol and recreational drugs before having sex. These substances can affect your judgment.   Before having sex with a new partner:  ? Talk to your partner about past partners, past STDs, and drug use.  ? You and your partner should be screened for STDs and discuss the results with each other.   Check your body regularly for sores, blisters, rashes, or unusual discharge. If you notice any of these problems, visit your health care provider.   If you have symptoms of an infection or you are being treated for an STD, avoid sexual contact.   While having sex, use a condom. Make sure to:  ? Use a condom every time you have vaginal, oral, or anal sex. Both females and males should wear condoms during oral sex.  ? Keep condoms in place from the beginning to the end of sexual activity.  ? Use a latex condom, if possible. Latex condoms offer the best protection.  ? Use only water-based lubricants or oils to lubricate a condom. Using petroleum-based lubricants or oils will weaken the condom and increase the chance that it will break.   See your health care provider for regular screenings, exams, and tests for STDs.   Talk with your health care provider about the form of birth control (contraception) that is best for you.   Get vaccinated against hepatitis B and human papillomavirus (HPV).   If you are at risk of being infected with HIV (human immunodeficiency virus), talk with your health care provider about taking a prescription medicine to prevent HIV infection. You are considered at risk for HIV if:  ? You are a man who has sex with other men.  ? You are a  heterosexual man or woman who is sexually active with more than one partner.  ? You take drugs by injection.  ? You are sexually active with a partner who has HIV.    This information is not intended to replace advice given to you by your health care provider. Make sure you discuss any questions you have with your health care provider.  Document Released: 10/20/2004 Document Revised: 01/27/2016 Document Reviewed: 08/02/2015  [employer]vier Interactive Patient Education  2018 [employer]

## 2018-03-26 NOTE — Progress Notes (Signed)
Subjective:  Patient ID: [name], male    DOB: [birth year]  Age: 57 y.o. MRN: [id number]  CC: Osteoarthritis and Hypertension   HPI CAIRO AGOSTINELLI presents for f/up - He has been seen in the ED recently for left hand pain.  He is wearing a thumb spica splint and says the pain is much better.  He cannot take anti-inflammatories because they cause bleeding.  He also has mild trigger mechanism in all of his fingers but he does not want to consider undergoing any steroid injections, physical therapy, or surgery at this time.  He wants to know what he can take for pain.  Outpatient Medications Prior to Visit  Medication Sig Dispense Refill  . Albuterol Sulfate (PROAIR HFA IN) Inhale 2 puffs into the lungs every 4 (four) hours as needed.    [name] ELLIPTA 62.5-25 MCG/INH AEPB Inhale 1 puff into the lungs daily.  1  . carvedilol (COREG) 12.5 MG tablet Take 1 tablet (12.5 mg total) by mouth 2 (two) times daily with a meal. Follow- appt due in June must see provider for future refills 180 tablet 0  . cetirizine (ZYRTEC) 10 MG tablet Take 10 mg by mouth daily.    . diclofenac sodium (VOLTAREN) 1 % GEL Apply 2 g topically 4 (four) times daily. 100 g 5  . esomeprazole (NEXIUM) 40 MG capsule Take 1 capsule (40 mg total) by mouth every morning. 90 capsule 1  . pravastatin (PRAVACHOL) 40 MG tablet Take 1 tablet (40 mg total) by mouth daily. 90 tablet 1  . PROAIR HFA 108 (90 Base) MCG/ACT inhaler Inhale 1-2 puffs into the lungs every 6 (six) hours as needed for wheezing or shortness of breath. 18 g 5  . traZODone (DESYREL) 100 MG tablet Take 3 tablets (300 mg total) by mouth at bedtime. 270 tablet 1  . TRUVADA 200-300 MG tablet Take 1 tablet by mouth daily. 90 tablet 1  . fluticasone (FLONASE) 50 MCG/ACT nasal spray Place 2 sprays into both nostrils daily.    . ciclopirox (PENLAC) 8 % solution APPLY topically AT BEDTIME. APPLY ALL over NAIL FOLD fold & surrounding SKIN. APPLY daily over prevoius  coat, AFTER SEVEN DAYS REMOVE with a 6.6 mL 2  . DULERA 100-5 MCG/ACT AERO Inhale 2 puffs into the lungs 2 (two) times daily.   11  . Naproxen Sodium 220 MG CAPS Take 1 capsule by mouth daily.    . Vilazodone HCl (VIIBRYD) 40 MG TABS Take 40 mg by mouth daily.    Marland Kitchen 0.9 %  sodium chloride infusion      No facility-administered medications prior to visit.     ROS Review of Systems  Constitutional: Negative.  Negative for diaphoresis, fatigue and unexpected weight change.  HENT: Positive for congestion, postnasal drip and rhinorrhea. Negative for facial swelling, nosebleeds, sinus pressure, sinus pain, sore throat and trouble swallowing.   Eyes: Negative for visual disturbance.  Respiratory: Positive for shortness of breath. Negative for cough, chest tightness and wheezing.   Gastrointestinal: Negative for abdominal pain, constipation, diarrhea, nausea and vomiting.  Endocrine: Negative.   Genitourinary: Negative.  Negative for difficulty urinating, discharge, dysuria, genital sores, penile swelling, scrotal swelling and testicular pain.  Musculoskeletal: Positive for arthralgias. Negative for myalgias and neck pain.  Skin: Negative.  Negative for color change, pallor and rash.  Neurological: Negative.  Negative for dizziness, weakness and light-headedness.  Hematological: Negative for adenopathy. Does not bruise/bleed easily.  Psychiatric/Behavioral: Negative.  Objective:  BP 128/68 (BP Location: Left Arm, Patient Position: Sitting, Cuff Size: Normal)   Pulse 71   Temp 98 F (36.7 C) (Oral)   Resp 16   Ht 6\' 3"  (1.905 m)   Wt 222 lb 12 oz (101 kg)   SpO2 96%   BMI 27.84 kg/m   BP Readings from Last 3 Encounters:  03/26/18 128/68  03/19/18 117/83  03/08/18 116/71    Wt Readings from Last 3 Encounters:  03/26/18 222 lb 12 oz (101 kg)  03/19/18 225 lb (102.1 kg)  02/26/18 219 lb (99.3 kg)    Physical Exam  Constitutional: He is oriented to person, place, and time. No  distress.  HENT:  Mouth/Throat: Oropharynx is clear and moist. No oropharyngeal exudate.  Eyes: Conjunctivae are normal. No scleral icterus.  Neck: Normal range of motion. Neck supple. No JVD present. No thyromegaly present.  Cardiovascular: Normal rate, regular rhythm and normal heart sounds. Exam reveals no friction rub.  No murmur heard. Pulmonary/Chest: Effort normal and breath sounds normal. He has no wheezes. He has no rales.  Abdominal: Soft. Normal appearance and bowel sounds are normal. He exhibits no mass. There is no hepatosplenomegaly. There is no tenderness. No hernia.  Musculoskeletal: Normal range of motion. He exhibits no edema or deformity.  Lymphadenopathy:    He has no cervical adenopathy.  Neurological: He is alert and oriented to person, place, and time.  Skin: Skin is warm and dry. No rash noted. He is not diaphoretic.  Psychiatric: He has a normal mood and affect. His behavior is normal. Judgment and thought content normal.  Vitals reviewed.   Lab Results  Component Value Date   WBC 6.4 11/23/2017   HGB 15.4 11/23/2017   HCT 45.5 11/23/2017   PLT 98 (L) 11/23/2017   GLUCOSE 103 11/23/2017   CHOL 142 10/04/2017   TRIG 129.0 10/04/2017   HDL 31.90 (L) 10/04/2017   LDLDIRECT 122.0 11/30/2016   LDLCALC 84 10/04/2017   ALT 22 11/23/2017   AST 18 11/23/2017   NA 142 11/23/2017   K 4.6 11/23/2017   CL 111 (H) 11/23/2017   CREATININE 0.86 11/23/2017   BUN 10 11/23/2017   CO2 23 11/23/2017   TSH 0.56 11/30/2016   PSA 0.60 11/30/2016   INR 1.03 08/26/2015   HGBA1C 6.2 12/07/2017   MICROALBUR 0.2 10/08/2008    Dg Hand Complete Left  Result Date: 03/19/2018 CLINICAL DATA:  Left hand pain. Patient reports beginning yoga of several days ago. No discrete episode of injury. EXAM: LEFT HAND - COMPLETE 3+ VIEW COMPARISON:  None in PACs FINDINGS: The bones are subjectively adequately mineralized. There is no acute or healing fracture. There is no lytic nor blastic  lesion. The IP, MCP, and CMC joint spaces are well maintained. The carpal bones are grossly normal. There is likely an old avulsion of the radial styloid versus an accessory ossicle. The soft tissues are normal. IMPRESSION: There is no acute or significant chronic bony abnormality of the left hand. Electronically Signed   By: David  Martinique M.D.   On: 03/19/2018 08:15    Assessment & Plan:   Clarion was seen today for osteoarthritis and hypertension.  Diagnoses and all orders for this visit:  High risk homosexual behavior- I will monitor him for HIV and hep B infection.  Will also monitor his renal function.  For now, he will continue taking Truvada. -     HIV antibody; Future -  Hepatitis B surface antigen; Future -     RPR; Future  Primary osteoarthritis of both knees -     Turmeric 500 MG CAPS; Take 1 capsule by mouth 2 (two) times daily.  Seasonal allergic rhinitis due to pollen -     fluticasone (FLONASE) 50 MCG/ACT nasal spray; Place 2 sprays into both nostrils daily.  HYPERTENSION, BENIGN- His blood pressure is well controlled.  I will monitor his electrolytes and renal function. -     Basic metabolic panel; Future  Syphili, latent- I will monitor his RPR titer and will address if indicated. -     HIV antibody; Future -     RPR; Future   I have discontinued Nephi T. Dignan's ciclopirox, DULERA, Vilazodone HCl, and Naproxen Sodium. I am also having him start on Turmeric. Additionally, I am having him maintain his esomeprazole, traZODone, carvedilol, pravastatin, Albuterol Sulfate (PROAIR HFA IN), cetirizine, TRUVADA, PROAIR HFA, diclofenac sodium, ANORO ELLIPTA, and fluticasone. We will stop administering sodium chloride.  Meds ordered this encounter  Medications  . fluticasone (FLONASE) 50 MCG/ACT nasal spray    Sig: Place 2 sprays into both nostrils daily.    Dispense:  48 g    Refill:  1  . Turmeric 500 MG CAPS    Sig: Take 1 capsule by mouth 2 (two) times daily.     Dispense:  180 capsule    Refill:  1     Follow-up: Return in about 6 months (around 09/26/2018).  Scarlette Calico, MD

## 2018-03-27 ENCOUNTER — Encounter: Payer: Self-pay | Admitting: Internal Medicine

## 2018-03-28 ENCOUNTER — Ambulatory Visit: Payer: Self-pay | Admitting: Internal Medicine

## 2018-03-28 ENCOUNTER — Other Ambulatory Visit: Payer: Self-pay | Admitting: Internal Medicine

## 2018-03-28 DIAGNOSIS — M6530 Trigger finger, unspecified finger: Secondary | ICD-10-CM | POA: Insufficient documentation

## 2018-03-28 MED ORDER — ZOSTER VAC RECOMB ADJUVANTED 50 MCG/0.5ML IM SUSR: 0.5000 mL | Freq: Once | INTRAMUSCULAR | 1 refills | Status: AC

## 2018-04-03 ENCOUNTER — Encounter: Payer: Self-pay | Admitting: Internal Medicine

## 2018-04-04 ENCOUNTER — Other Ambulatory Visit: Payer: Self-pay | Admitting: Internal Medicine

## 2018-04-05 ENCOUNTER — Encounter: Payer: Self-pay | Admitting: Internal Medicine

## 2018-04-05 ENCOUNTER — Other Ambulatory Visit (INDEPENDENT_AMBULATORY_CARE_PROVIDER_SITE_OTHER): Payer: Medicare Other

## 2018-04-05 ENCOUNTER — Ambulatory Visit (INDEPENDENT_AMBULATORY_CARE_PROVIDER_SITE_OTHER): Payer: Medicare Other | Admitting: Internal Medicine

## 2018-04-05 VITALS — BP 118/80 | HR 72 | Temp 98.4°F | Resp 16 | Ht 75.0 in | Wt 218.0 lb

## 2018-04-05 DIAGNOSIS — R351 Nocturia: Secondary | ICD-10-CM

## 2018-04-05 DIAGNOSIS — I10 Essential (primary) hypertension: Secondary | ICD-10-CM | POA: Diagnosis not present

## 2018-04-05 DIAGNOSIS — Z0000 Encounter for general adult medical examination without abnormal findings: Secondary | ICD-10-CM | POA: Diagnosis not present

## 2018-04-05 DIAGNOSIS — Z7252 High risk homosexual behavior: Secondary | ICD-10-CM

## 2018-04-05 DIAGNOSIS — A530 Latent syphilis, unspecified as early or late: Secondary | ICD-10-CM | POA: Diagnosis not present

## 2018-04-05 DIAGNOSIS — F17210 Nicotine dependence, cigarettes, uncomplicated: Secondary | ICD-10-CM

## 2018-04-05 DIAGNOSIS — N401 Enlarged prostate with lower urinary tract symptoms: Secondary | ICD-10-CM | POA: Diagnosis not present

## 2018-04-05 LAB — BASIC METABOLIC PANEL
BUN: 10 mg/dL (ref 6–23)
CALCIUM: 9.6 mg/dL (ref 8.4–10.5)
CO2: 27 meq/L (ref 19–32)
CREATININE: 0.88 mg/dL (ref 0.40–1.50)
Chloride: 105 mEq/L (ref 96–112)
GFR: 114.32 mL/min (ref >60.00)
GLUCOSE: 89 mg/dL (ref 70–99)
Potassium: 4.5 mEq/L (ref 3.5–5.1)
Sodium: 139 mEq/L (ref 135–145)

## 2018-04-05 LAB — PSA: PSA: 0.57 ng/mL (ref 0.10–4.00)

## 2018-04-05 MED ORDER — VARENICLINE TARTRATE 0.5 MG X 11 & 1 MG X 42 PO MISC: ORAL | 0 refills | Status: DC

## 2018-04-05 MED ORDER — ZOSTER VAC RECOMB ADJUVANTED 50 MCG/0.5ML IM SUSR: 0.5000 mL | Freq: Once | INTRAMUSCULAR | 1 refills | Status: AC
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
## 2018-04-05 NOTE — Telephone Encounter (Signed)
Do you know who the hand surgeon will be?

## 2018-04-05 NOTE — Patient Instructions (Signed)

## 2018-04-06 LAB — FLUORESCENT TREPONEMAL AB(FTA)-IGG-BLD: FLUORESCENT TREPONEMAL ABS: REACTIVE — AB

## 2018-04-06 LAB — RPR TITER

## 2018-04-06 LAB — RPR: RPR: REACTIVE — AB

## 2018-04-06 LAB — HEPATITIS B SURFACE ANTIGEN: HEP B S AG: NONREACTIVE

## 2018-04-06 LAB — HIV ANTIBODY (ROUTINE TESTING W REFLEX): HIV: NONREACTIVE

## 2018-04-08 ENCOUNTER — Encounter: Payer: Self-pay | Admitting: Internal Medicine

## 2018-04-08 NOTE — Assessment & Plan Note (Signed)

## 2018-04-11 ENCOUNTER — Encounter (INDEPENDENT_AMBULATORY_CARE_PROVIDER_SITE_OTHER): Payer: Self-pay | Admitting: Orthopaedic Surgery

## 2018-04-11 ENCOUNTER — Ambulatory Visit (INDEPENDENT_AMBULATORY_CARE_PROVIDER_SITE_OTHER): Payer: Medicare Other | Admitting: Orthopaedic Surgery

## 2018-04-11 ENCOUNTER — Ambulatory Visit (INDEPENDENT_AMBULATORY_CARE_PROVIDER_SITE_OTHER): Payer: Self-pay

## 2018-04-11 DIAGNOSIS — M6530 Trigger finger, unspecified finger: Secondary | ICD-10-CM | POA: Diagnosis not present

## 2018-04-11 DIAGNOSIS — M654 Radial styloid tenosynovitis [de Quervain]: Secondary | ICD-10-CM | POA: Diagnosis not present

## 2018-04-11 MED ORDER — LIDOCAINE HCL 1 % IJ SOLN
0.3000 mL | INTRAMUSCULAR | Status: AC | PRN
  Administered 2018-04-11: .3 mL

## 2018-04-11 MED ORDER — METHYLPREDNISOLONE ACETATE 40 MG/ML IJ SUSP
13.3300 mg | INTRAMUSCULAR | Status: AC | PRN
  Administered 2018-04-11: 13.33 mg

## 2018-04-11 MED ORDER — BUPIVACAINE HCL 0.5 % IJ SOLN
0.3300 mL | INTRAMUSCULAR | Status: AC | PRN
  Administered 2018-04-11: .33 mL
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
## 2018-04-12 ENCOUNTER — Encounter (INDEPENDENT_AMBULATORY_CARE_PROVIDER_SITE_OTHER): Payer: Self-pay | Admitting: Orthopaedic Surgery

## 2018-04-12 ENCOUNTER — Encounter: Payer: Self-pay | Admitting: Internal Medicine

## 2018-04-12 NOTE — Telephone Encounter (Signed)
Let's send in some tramadol 50 mg.  1-2 tab TID prn #30

## 2018-04-18 IMAGING — CR DG CHEST 2V
2 series · 4 of 4 positions shown · non-contrast
Comparison: 10/11/2016.

CLINICAL DATA: Blood coming out of the penis.  Lung cancer.

EXAM:
CHEST  2 VIEW

[Series 2: chest lat · 0.14mm/px · 2 of 2 slices shown]
[im 1/2]
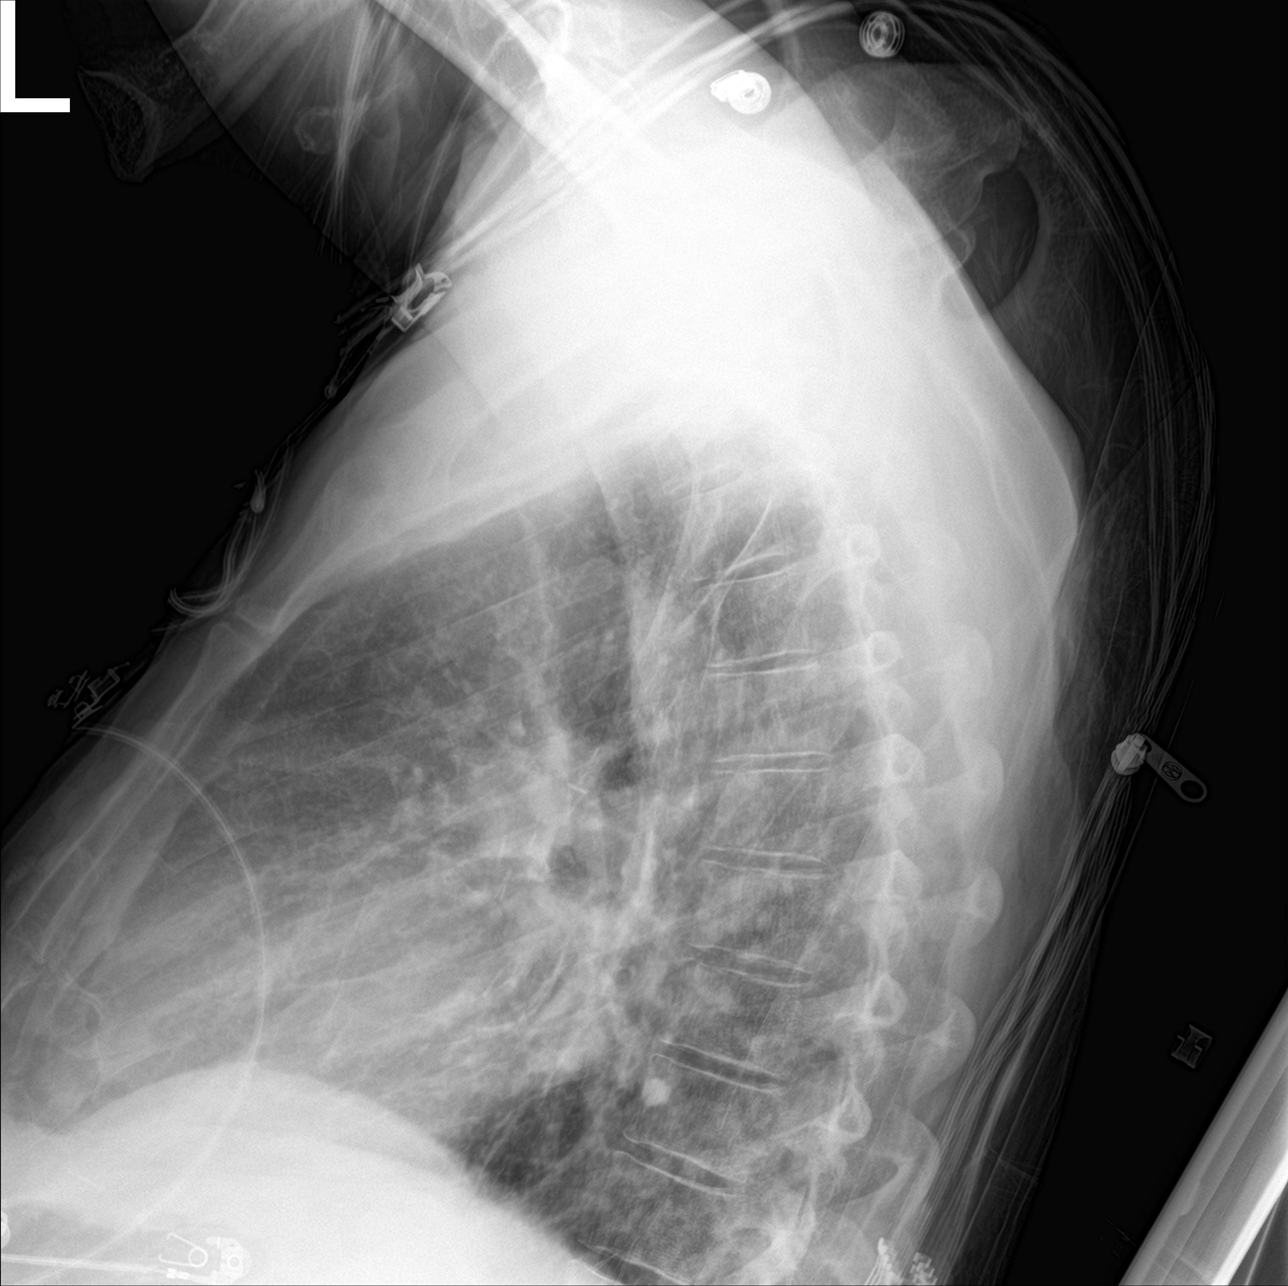
[im 2/2]
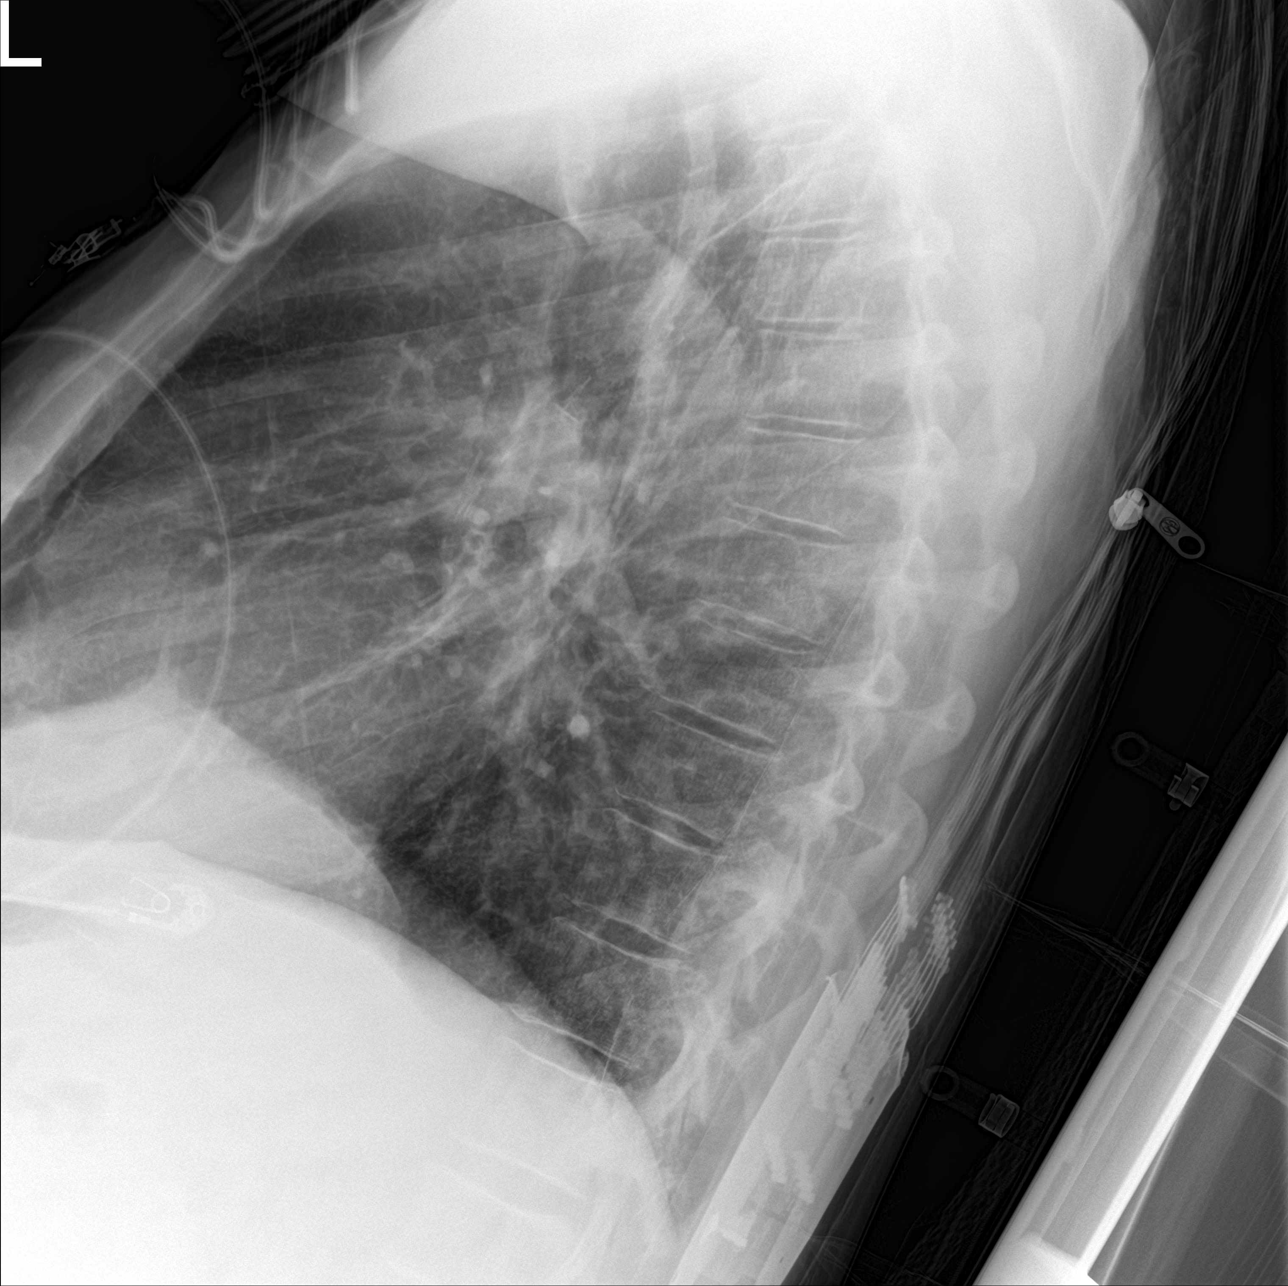

[Series 3: chest ap · 0.14mm/px · 2 of 2 slices shown]
[im 1/2]
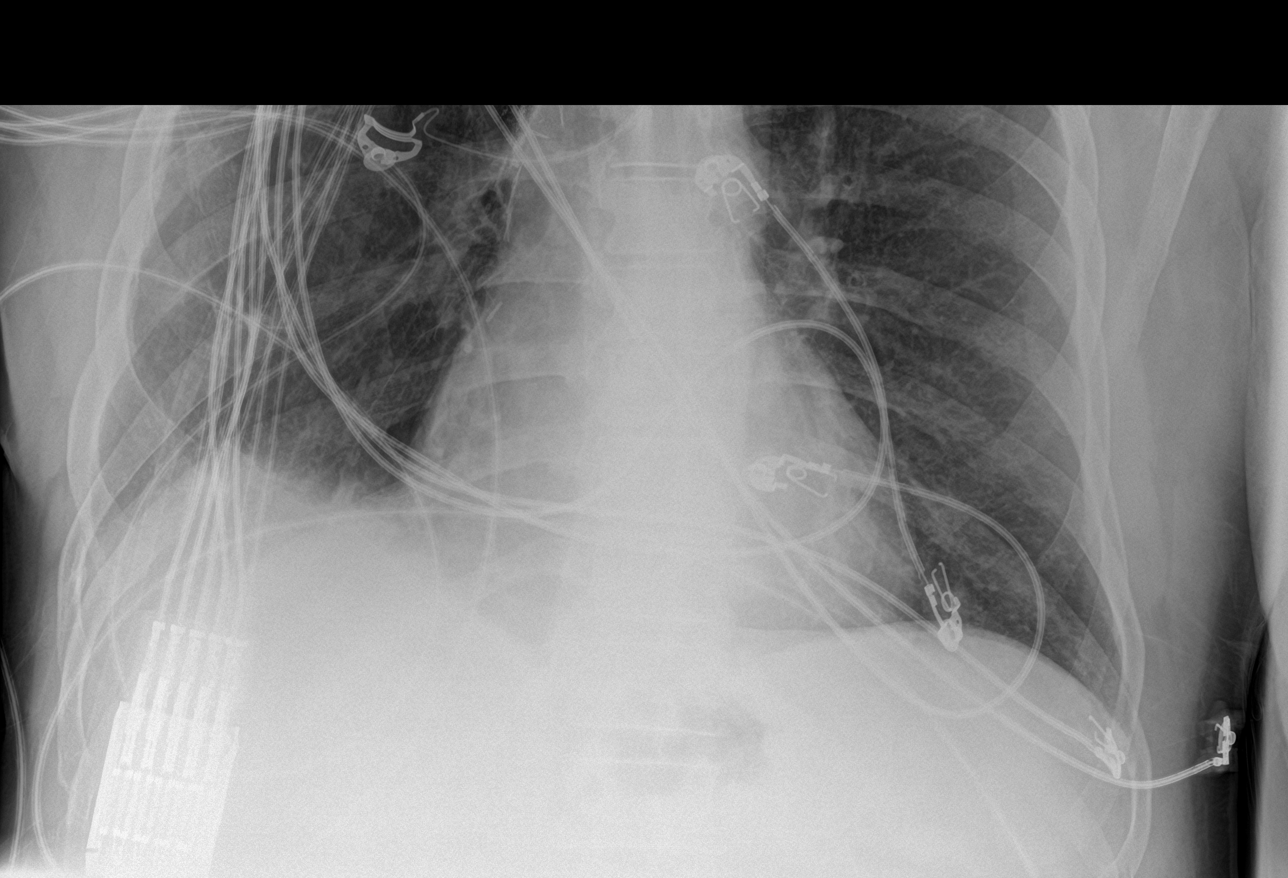
[im 2/2]
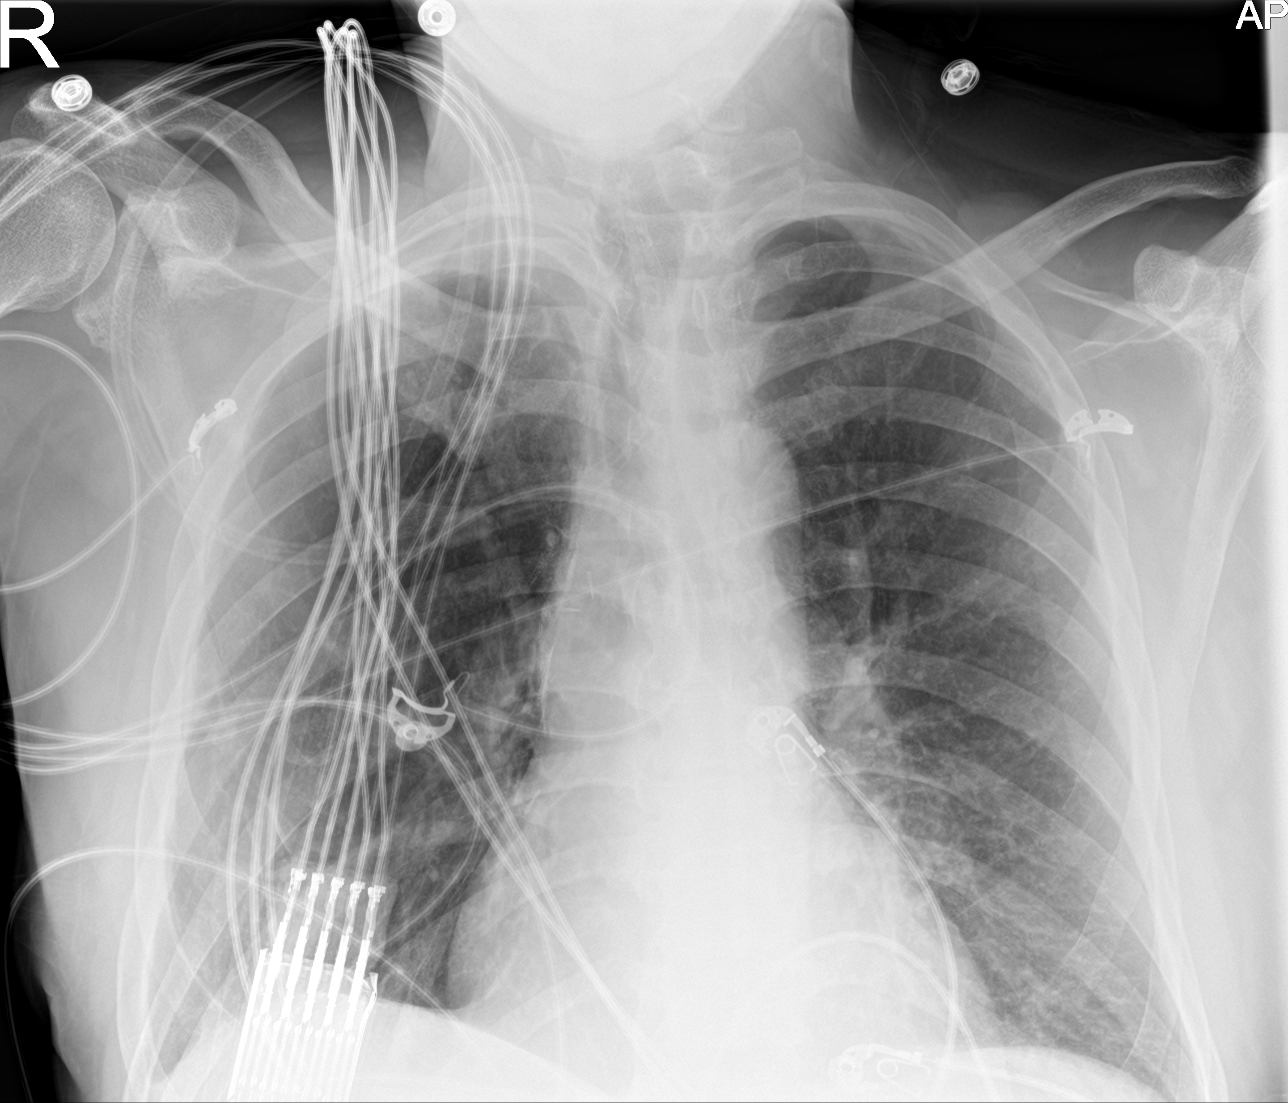

[4 of 4 positions shown; findings below may reference images not displayed]

FINDINGS: RIGHT base scarring. No active infiltrates or failure. No lung
masses seen within limits for detection due to overlying telemetry
leads. Stable cardiomediastinal silhouette. No visible pneumothorax.
IMPRESSION: RIGHT base scarring appears stable.

## 2018-05-03 ENCOUNTER — Other Ambulatory Visit: Payer: Self-pay | Admitting: Internal Medicine

## 2018-05-03 DIAGNOSIS — M170 Bilateral primary osteoarthritis of knee: Secondary | ICD-10-CM

## 2018-05-03 DIAGNOSIS — J449 Chronic obstructive pulmonary disease, unspecified: Secondary | ICD-10-CM

## 2018-05-09 ENCOUNTER — Ambulatory Visit (HOSPITAL_COMMUNITY): Payer: Self-pay | Admitting: Psychiatry

## 2018-05-09 ENCOUNTER — Encounter

## 2018-05-24 ENCOUNTER — Other Ambulatory Visit (INDEPENDENT_AMBULATORY_CARE_PROVIDER_SITE_OTHER): Payer: Self-pay | Admitting: Radiology

## 2018-05-24 MED ORDER — TRAMADOL HCL 50 MG PO TABS: ORAL_TABLET | ORAL | 0 refills | Status: DC

## 2018-05-24 NOTE — Telephone Encounter (Signed)
Tramadol 50 mg TID prn #20

## 2018-05-25 ENCOUNTER — Ambulatory Visit: Payer: Medicare Other | Admitting: Podiatry

## 2018-05-25 ENCOUNTER — Encounter: Payer: Self-pay | Admitting: Internal Medicine

## 2018-05-25 DIAGNOSIS — F418 Other specified anxiety disorders: Secondary | ICD-10-CM

## 2018-05-25 DIAGNOSIS — J301 Allergic rhinitis due to pollen: Secondary | ICD-10-CM

## 2018-05-25 DIAGNOSIS — M6530 Trigger finger, unspecified finger: Secondary | ICD-10-CM

## 2018-05-25 DIAGNOSIS — K279 Peptic ulcer, site unspecified, unspecified as acute or chronic, without hemorrhage or perforation: Secondary | ICD-10-CM

## 2018-05-25 DIAGNOSIS — Z7251 High risk heterosexual behavior: Secondary | ICD-10-CM

## 2018-05-25 DIAGNOSIS — E785 Hyperlipidemia, unspecified: Secondary | ICD-10-CM

## 2018-05-25 DIAGNOSIS — J449 Chronic obstructive pulmonary disease, unspecified: Secondary | ICD-10-CM

## 2018-05-25 DIAGNOSIS — M170 Bilateral primary osteoarthritis of knee: Secondary | ICD-10-CM

## 2018-05-25 DIAGNOSIS — I739 Peripheral vascular disease, unspecified: Secondary | ICD-10-CM

## 2018-05-26 ENCOUNTER — Other Ambulatory Visit: Payer: Self-pay | Admitting: Internal Medicine

## 2018-06-04 MED ORDER — TRAZODONE HCL 100 MG PO TABS: 300.0000 mg | ORAL_TABLET | Freq: Every day | ORAL | 1 refills | Status: DC

## 2018-06-04 MED ORDER — ALBUTEROL SULFATE HFA 108 (90 BASE) MCG/ACT IN AERS: 1.0000 | INHALATION_SPRAY | Freq: Four times a day (QID) | RESPIRATORY_TRACT | 3 refills | Status: DC | PRN

## 2018-06-04 MED ORDER — CARVEDILOL 12.5 MG PO TABS: 12.5000 mg | ORAL_TABLET | Freq: Two times a day (BID) | ORAL | 1 refills | Status: DC

## 2018-06-04 MED ORDER — FLUTICASONE PROPIONATE 50 MCG/ACT NA SUSP
2.0000 | Freq: Every day | NASAL | 1 refills | Status: DC
Start: 2018-06-04 — End: 2018-11-19

## 2018-06-04 MED ORDER — PRAVASTATIN SODIUM 40 MG PO TABS: 40.0000 mg | ORAL_TABLET | Freq: Every day | ORAL | 1 refills | Status: DC

## 2018-06-04 MED ORDER — DICLOFENAC SODIUM 1 % TD GEL: 2.0000 g | Freq: Four times a day (QID) | TRANSDERMAL | 5 refills | Status: DC

## 2018-06-04 MED ORDER — EMTRICITABINE-TENOFOVIR DF 200-300 MG PO TABS: 1.0000 | ORAL_TABLET | Freq: Every day | ORAL | 1 refills | Status: DC

## 2018-06-04 MED ORDER — ESOMEPRAZOLE MAGNESIUM 40 MG PO CPDR
40.0000 mg | DELAYED_RELEASE_CAPSULE | Freq: Every morning | ORAL | 1 refills | Status: DC
Start: 2018-06-04 — End: 2019-06-15

## 2018-06-04 MED ORDER — ANORO ELLIPTA 62.5-25 MCG/INH IN AEPB: 1.0000 | INHALATION_SPRAY | Freq: Every day | RESPIRATORY_TRACT | 1 refills | Status: DC

## 2018-06-04 NOTE — Addendum Note (Signed)
Addended by: Aviva Signs M on: 06/04/2018 10:49 AM   Modules accepted: Orders

## 2018-06-04 NOTE — Addendum Note (Signed)
Addended by: Aviva Signs M on: 06/04/2018 10:50 AM   Modules accepted: Orders

## 2018-06-05 ENCOUNTER — Encounter: Payer: Self-pay | Admitting: Internal Medicine

## 2018-06-05 ENCOUNTER — Telehealth: Payer: Self-pay | Admitting: Internal Medicine

## 2018-06-05 ENCOUNTER — Other Ambulatory Visit (INDEPENDENT_AMBULATORY_CARE_PROVIDER_SITE_OTHER): Payer: Medicare Other

## 2018-06-05 ENCOUNTER — Other Ambulatory Visit: Payer: Self-pay | Admitting: Internal Medicine

## 2018-06-05 ENCOUNTER — Ambulatory Visit (INDEPENDENT_AMBULATORY_CARE_PROVIDER_SITE_OTHER): Payer: Medicare Other | Admitting: Internal Medicine

## 2018-06-05 VITALS — BP 118/74 | HR 67 | Temp 97.5°F | Resp 16 | Ht 75.0 in | Wt 227.0 lb

## 2018-06-05 DIAGNOSIS — I10 Essential (primary) hypertension: Secondary | ICD-10-CM | POA: Diagnosis not present

## 2018-06-05 DIAGNOSIS — R040 Epistaxis: Secondary | ICD-10-CM | POA: Diagnosis not present

## 2018-06-05 DIAGNOSIS — A530 Latent syphilis, unspecified as early or late: Secondary | ICD-10-CM

## 2018-06-05 DIAGNOSIS — Z23 Encounter for immunization: Secondary | ICD-10-CM | POA: Diagnosis not present

## 2018-06-05 DIAGNOSIS — Z7252 High risk homosexual behavior: Secondary | ICD-10-CM | POA: Diagnosis not present

## 2018-06-05 DIAGNOSIS — D696 Thrombocytopenia, unspecified: Secondary | ICD-10-CM

## 2018-06-05 DIAGNOSIS — J449 Chronic obstructive pulmonary disease, unspecified: Secondary | ICD-10-CM

## 2018-06-05 DIAGNOSIS — K279 Peptic ulcer, site unspecified, unspecified as acute or chronic, without hemorrhage or perforation: Secondary | ICD-10-CM

## 2018-06-05 LAB — CBC WITH DIFFERENTIAL/PLATELET
BASOS ABS: 0 10*3/uL (ref 0.0–0.1)
Basophils Relative: 0.3 % (ref 0.0–3.0)
EOS PCT: 1.2 % (ref 0.0–5.0)
Eosinophils Absolute: 0.1 10*3/uL (ref 0.0–0.7)
HEMATOCRIT: 43.1 % (ref 39.0–52.0)
HEMOGLOBIN: 14.9 g/dL (ref 13.0–17.0)
Lymphocytes Relative: 34.5 % (ref 12.0–46.0)
Lymphs Abs: 2.8 10*3/uL (ref 0.7–4.0)
MCHC: 34.6 g/dL (ref 30.0–36.0)
MCV: 86.8 fl (ref 78.0–100.0)
MONOS PCT: 9 % (ref 3.0–12.0)
Monocytes Absolute: 0.7 10*3/uL (ref 0.1–1.0)
NEUTROS PCT: 55 % (ref 43.0–77.0)
Neutro Abs: 4.5 10*3/uL (ref 1.4–7.7)
Platelets: 123 10*3/uL — ABNORMAL LOW (ref 150.0–400.0)
RBC: 4.97 Mil/uL (ref 4.22–5.81)
RDW: 14.7 % (ref 11.5–15.5)
WBC: 8.2 10*3/uL (ref 4.0–10.5)

## 2018-06-05 LAB — COMPREHENSIVE METABOLIC PANEL
ALBUMIN: 4.2 g/dL (ref 3.5–5.2)
ALK PHOS: 106 U/L (ref 39–117)
ALT: 21 U/L (ref 0–53)
AST: 18 U/L (ref 0–37)
BUN: 9 mg/dL (ref 6–23)
CALCIUM: 9.5 mg/dL (ref 8.4–10.5)
CO2: 25 mEq/L (ref 19–32)
CREATININE: 0.88 mg/dL (ref 0.40–1.50)
Chloride: 107 mEq/L (ref 96–112)
GFR: 114.25 mL/min (ref >60.00)
GLUCOSE: 89 mg/dL (ref 70–99)
POTASSIUM: 4.2 meq/L (ref 3.5–5.1)
Sodium: 138 mEq/L (ref 135–145)
TOTAL PROTEIN: 7.3 g/dL (ref 6.0–8.3)
Total Bilirubin: 0.7 mg/dL (ref 0.2–1.2)

## 2018-06-05 LAB — APTT: aPTT: 33.5 s — ABNORMAL HIGH (ref 23.4–32.7)

## 2018-06-05 LAB — PROTIME-INR
INR: 1.1 ratio — AB (ref 0.8–1.0)
PROTHROMBIN TIME: 13.2 s — AB (ref 9.6–13.1)

## 2018-06-05 MED ORDER — UMECLIDINIUM-VILANTEROL 62.5-25 MCG/INH IN AEPB: 1.0000 | INHALATION_SPRAY | Freq: Every day | RESPIRATORY_TRACT | 1 refills | Status: DC

## 2018-06-05 MED ORDER — CETIRIZINE HCL 10 MG PO TABS: 10.0000 mg | ORAL_TABLET | Freq: Every day | ORAL | 1 refills | Status: DC

## 2018-06-05 NOTE — Telephone Encounter (Signed)
Copied from [place] [id number]. Topic: Quick Communication - Rx Refill/Question >> Jun 05, 2018  3:46 PM Waylan Rocher, Lumin L wrote: Medication: diclofenac sodium (VOLTAREN) 1 % GEL (call pharmacy to confirm the patient is able to tolerate the gel although allergic to aspirin)  Has the patient contacted their pharmacy? Yes.   (Agent: If no, request that the patient contact the pharmacy for the refill.) (Agent: If yes, when and what did the pharmacy advise?)  Preferred Pharmacy (with phone number or street name): [facility], [place] [address] [place] [address] Phone: [phone] Fax: [phone]  Agent: Please be advised that RX refills may take up to 3 business days. We ask that you follow-up with your pharmacy.

## 2018-06-05 NOTE — Progress Notes (Addendum)
Subjective:  Patient ID: [name], male    DOB: 11-26-1960  Age: 57 y.o. MRN: [id number]  CC: Hypertension   HPI DURON MEISTER presents for f/up - he complains of intermittent L nosebleeds for 1 month.  Outpatient Medications Prior to Visit  Medication Sig Dispense Refill  . Albuterol Sulfate (PROAIR HFA IN) Inhale 2 puffs into the lungs every 4 (four) hours as needed.    . carvedilol (COREG) 12.5 MG tablet Take 1 tablet (12.5 mg total) by mouth 2 (two) times daily with a meal. Follow- appt due in June must see provider for future refills 180 tablet 1  . diclofenac sodium (VOLTAREN) 1 % GEL Apply 2 g topically 4 (four) times daily. 100 g 5  . emtricitabine-tenofovir (TRUVADA) 200-300 MG tablet Take 1 tablet by mouth daily. 90 tablet 1  . esomeprazole (NEXIUM) 40 MG capsule Take 1 capsule (40 mg total) by mouth every morning. 90 capsule 1  . fluticasone (FLONASE) 50 MCG/ACT nasal spray Place 2 sprays into both nostrils daily. 48 g 1  . pravastatin (PRAVACHOL) 40 MG tablet Take 1 tablet (40 mg total) by mouth daily. 90 tablet 1  . traZODone (DESYREL) 100 MG tablet Take 3 tablets (300 mg total) by mouth at bedtime. 270 tablet 1  . Turmeric 500 MG CAPS Take 1 capsule by mouth 2 (two) times daily. 180 capsule 1  . albuterol (PROAIR HFA) 108 (90 Base) MCG/ACT inhaler Inhale 1-2 puffs into the lungs every 6 (six) hours as needed for wheezing or shortness of breath. 3 Inhaler 3  . ANORO ELLIPTA 62.5-25 MCG/INH AEPB Inhale 1 puff into the lungs daily. 90 each 1  . cetirizine (ZYRTEC) 10 MG tablet Take 10 mg by mouth daily.    Marland Kitchen esomeprazole (NEXIUM) 40 MG capsule Take 1 capsule (40 mg total) by mouth every morning. 90 capsule 1  . traMADol (ULTRAM) 50 MG tablet Take 1-2 tablets TID as needed for pain prn. 30 tablet 0  . ANORO ELLIPTA 62.5-25 MCG/INH AEPB Inhale 1 puff into the lungs daily. 180 each 1  . CHANTIX CONTINUING MONTH PAK 1 MG tablet TAKE ONE TABLET BY MOUTH TWICE DAILY 60 tablet  3  . varenicline (CHANTIX STARTING MONTH PAK) 0.5 MG X 11 & 1 MG X 42 tablet Take one 0.5 mg tablet by mouth once daily for 3 days, then increase to one 0.5 mg tablet twice daily for 4 days, then increase to one 1 mg tablet twice daily. 53 tablet 0   No facility-administered medications prior to visit.     ROS Review of Systems  Constitutional: Negative for chills, fatigue and fever.  HENT: Positive for nosebleeds. Negative for congestion, facial swelling, postnasal drip, rhinorrhea, sinus pressure, sinus pain, sneezing, sore throat and trouble swallowing.   Eyes: Negative for visual disturbance.  Respiratory: Negative for cough, chest tightness and shortness of breath.   Cardiovascular: Negative for chest pain, palpitations and leg swelling.  Gastrointestinal: Negative for abdominal pain, constipation, diarrhea, nausea and vomiting.  Endocrine: Negative.   Genitourinary: Negative.  Negative for difficulty urinating, genital sores and hematuria.  Musculoskeletal: Negative.   Skin: Negative.  Negative for rash.  Neurological: Negative.  Negative for dizziness, weakness and light-headedness.  Hematological: Negative for adenopathy. Does not bruise/bleed easily.  Psychiatric/Behavioral: Negative.     Objective:  BP 118/74 (BP Location: Left Arm, Patient Position: Sitting, Cuff Size: Normal)   Pulse 67   Temp (!) 97.5 F (36.4 C) (Oral)  Resp 16   Ht 6\' 3"  (1.905 m)   Wt 227 lb (103 kg)   SpO2 97%   BMI 28.37 kg/m   BP Readings from Last 3 Encounters:  06/06/18 104/60  06/05/18 118/74  04/08/18 118/80    Wt Readings from Last 3 Encounters:  06/06/18 224 lb 12.8 oz (102 kg)  06/05/18 227 lb (103 kg)  04/08/18 218 lb (98.9 kg)    Physical Exam  Constitutional: He is oriented to person, place, and time. No distress.  HENT:  Nose: No mucosal edema, rhinorrhea, nasal deformity or nasal septal hematoma. No epistaxis.  No foreign bodies. Right sinus exhibits no maxillary sinus  tenderness and no frontal sinus tenderness. Left sinus exhibits no maxillary sinus tenderness and no frontal sinus tenderness.  Mouth/Throat: Oropharynx is clear and moist. No oropharyngeal exudate.  Eyes: Conjunctivae are normal. No scleral icterus.  Neck: Normal range of motion. Neck supple. No JVD present. No thyromegaly present.  Cardiovascular: Normal rate, regular rhythm and normal heart sounds.  Pulmonary/Chest: Effort normal and breath sounds normal. No respiratory distress. He has no wheezes. He has no rales.  Abdominal: Soft. Bowel sounds are normal. He exhibits no mass. There is no hepatosplenomegaly. There is no tenderness.  Musculoskeletal: Normal range of motion. He exhibits no edema, tenderness or deformity.  Lymphadenopathy:    He has no cervical adenopathy.  Neurological: He is alert and oriented to person, place, and time.  Skin: Skin is warm and dry. No rash noted. He is not diaphoretic.  Vitals reviewed.   Lab Results  Component Value Date   WBC 8.2 06/05/2018   HGB 14.9 06/05/2018   HCT 43.1 06/05/2018   PLT 123.0 (L) 06/05/2018   GLUCOSE 89 06/05/2018   CHOL 142 10/04/2017   TRIG 129.0 10/04/2017   HDL 31.90 (L) 10/04/2017   LDLDIRECT 122.0 11/30/2016   LDLCALC 84 10/04/2017   ALT 21 06/05/2018   AST 18 06/05/2018   NA 138 06/05/2018   K 4.2 06/05/2018   CL 107 06/05/2018   CREATININE 0.88 06/05/2018   BUN 9 06/05/2018   CO2 25 06/05/2018   TSH 0.56 11/30/2016   PSA 0.57 04/05/2018   INR 1.1 (H) 06/05/2018   HGBA1C 6.2 12/07/2017   MICROALBUR 0.2 10/08/2008    Dg Hand Complete Left  Result Date: 03/19/2018 CLINICAL DATA:  Left hand pain. Patient reports beginning yoga of several days ago. No discrete episode of injury. EXAM: LEFT HAND - COMPLETE 3+ VIEW COMPARISON:  None in PACs FINDINGS: The bones are subjectively adequately mineralized. There is no acute or healing fracture. There is no lytic nor blastic lesion. The IP, MCP, and CMC joint spaces are  well maintained. The carpal bones are grossly normal. There is likely an old avulsion of the radial styloid versus an accessory ossicle. The soft tissues are normal. IMPRESSION: There is no acute or significant chronic bony abnormality of the left hand. Electronically Signed   By: David  Martinique M.D.   On: 03/19/2018 08:15    Assessment & Plan:   Fredick was seen today for hypertension.  Diagnoses and all orders for this visit:  Need for influenza vaccination -     Flu Vaccine QUAD 36+ mos IM  COPD GOLD 0 copd -his symptoms are well controlled.  Have asked him to stop smoking. -     umeclidinium-vilanterol (ANORO ELLIPTA) 62.5-25 MCG/INH AEPB; Inhale 1 puff into the lungs daily.  Left-sided epistaxis- His platelet count is higher than  it was previously so I do not think this is contributing to the nosebleeds.  However, he also has a mildly abnormal PT and PTT which is consistent with his history of liver disease (hepatitis B).  He smokes so I am concerned about nasopharyngeal carcinoma and have therefore ordered a CT scan to see if there is a mass to explain the bleeding. -     CBC with Differential/Platelet; Future -     Protime-INR; Future -     APTT; Future  HYPERTENSION, BENIGN- His blood pressure is well controlled.  Electrolytes and renal function are normal. -     Comprehensive metabolic panel; Future  High risk homosexual behavior- His renal function is normal.  Screening for hep B and HIV is negative.  He can continue taking Truvada. -     RPR; Future -     Hepatitis B surface antigen; Future -     HIV antibody; Future  Thrombocytopenia (HCC)-improvement noted. -     CBC with Differential/Platelet; Future  Syphili, latent- His RPR titer remains at 1:1.  He is "serofast".  Antibiotic therapy is not indicated. -     RPR; Future -     HIV antibody; Future  Epistaxis -     CT Maxillofacial WO CM; Future  Other orders -     cetirizine (ZYRTEC) 10 MG tablet; Take 1 tablet (10  mg total) by mouth daily.   I have discontinued Jackob T. Fear's CHANTIX CONTINUING MONTH PAK, varenicline, and ANORO ELLIPTA. I have changed his ANORO ELLIPTA to umeclidinium-vilanterol. I have also changed his cetirizine. Additionally, I am having him maintain his Albuterol Sulfate (PROAIR HFA IN), Turmeric, diclofenac sodium, esomeprazole, fluticasone, pravastatin, traZODone, emtricitabine-tenofovir, and carvedilol.  Meds ordered this encounter  Medications  . umeclidinium-vilanterol (ANORO ELLIPTA) 62.5-25 MCG/INH AEPB    Sig: Inhale 1 puff into the lungs daily.    Dispense:  90 each    Refill:  1    This prescription was filled on 05/03/2018. Any refills authorized will be placed on file.  . cetirizine (ZYRTEC) 10 MG tablet    Sig: Take 1 tablet (10 mg total) by mouth daily.    Dispense:  90 tablet    Refill:  1     Follow-up: Return in about 4 weeks (around 07/03/2018).  Scarlette Calico, MD

## 2018-06-05 NOTE — Patient Instructions (Signed)
Nosebleed, Adult A nosebleed is when blood comes out of the nose. Nosebleeds are common. Usually, they are not a sign of a serious condition. Nosebleeds can happen if a small blood vessel in your nose starts to bleed or if the lining of your nose (mucous membrane) cracks. They are commonly caused by:  Allergies.  Colds.  Picking your nose.  Blowing your nose too hard.  An injury from sticking an object into your nose or getting hit in the nose.  Dry or cold air.  Less common causes of nosebleeds include:  Toxic fumes.  Something abnormal in the nose or in the air-filled spaces in the bones of the face (sinuses).  Growths in the nose, such as polyps.  Medicines or conditions that cause blood to clot slowly.  Certain illnesses or procedures that irritate or dry out the nasal passages.  Follow these instructions at home: When you have a nosebleed:  Sit down and tilt your head slightly forward.  Use a clean towel or tissue to pinch your nostrils under the bony part of your nose. After 10 minutes, let go of your nose and see if bleeding starts again. Do not release pressure before that time. If there is still bleeding, repeat the pinching and holding for 10 minutes until the bleeding stops.  Do not place tissues or gauze in the nose to stop bleeding.  Avoid lying down and avoid tilting your head backward. That may make blood collect in the throat and cause gagging or coughing.  Use a nasal spray decongestant to help with a nosebleed as told by your health care provider.  Do not use petroleum jelly or mineral oil in your nose. It can drip into your lungs. After a nosebleed:  Avoid blowing your nose or sniffing for a number of hours.  Avoid straining, lifting, or bending at the waist for several days. You may resume other normal activities as you are able.  Use saline spray or a humidifier as told by your health care provider.  Aspirinand blood thinners make bleeding more  likely. If you are prescribed these medicines and you suffer from nosebleeds: ? Ask your health care provider if you should stop taking the medicines or if you should adjust the dose. ? Do not stop taking medicines that your health care provider has recommended unless told by your health care provider.  If your nosebleed was caused by dry mucous membranes, use over-the-counter saline nasal spray or gel. This will keep the mucous membranes moist and allow them to heal. If you must use a lubricant: ? Choose one that is water-soluble. ? Use only as much as you need and use it only as often as needed. ? Do not lie down until several hours after you use it. Contact a health care provider if:  You have a fever.  You get nosebleeds often or more often than usual.  You bruise very easily.  You have a nosebleed from having something stuck in your nose.  You have bleeding in your mouth.  You vomit or cough up brown material.  You have a nosebleed after you start a new medicine. Get help right away if:  You have a nosebleed after a fall or a head injury.  Your nosebleed does not go away after 20 minutes.  You feel dizzy or weak.  You have unusual bleeding from other parts of your body.  You have unusual bruising on other parts of your body.  You become sweaty.    You vomit blood. This information is not intended to replace advice given to you by your health care provider. Make sure you discuss any questions you have with your health care provider. Document Released: 06/22/2005 Document Revised: 05/12/2016 Document Reviewed: 03/29/2016 Elsevier Interactive Patient Education  2018 [employer]  

## 2018-06-06 ENCOUNTER — Encounter: Payer: Self-pay | Admitting: Internal Medicine

## 2018-06-06 ENCOUNTER — Ambulatory Visit (INDEPENDENT_AMBULATORY_CARE_PROVIDER_SITE_OTHER): Payer: Medicare Other | Admitting: Internal Medicine

## 2018-06-06 VITALS — BP 104/60 | HR 78 | Ht 72.5 in | Wt 224.8 lb

## 2018-06-06 DIAGNOSIS — J449 Chronic obstructive pulmonary disease, unspecified: Secondary | ICD-10-CM

## 2018-06-06 DIAGNOSIS — F17210 Nicotine dependence, cigarettes, uncomplicated: Secondary | ICD-10-CM

## 2018-06-06 LAB — HEPATITIS B SURFACE ANTIGEN: HEP B S AG: NONREACTIVE

## 2018-06-06 LAB — FLUORESCENT TREPONEMAL AB(FTA)-IGG-BLD: FLUORESCENT TREPONEMAL ABS: REACTIVE — AB

## 2018-06-06 LAB — HIV ANTIBODY (ROUTINE TESTING W REFLEX): HIV 1&2 Ab, 4th Generation: NONREACTIVE

## 2018-06-06 LAB — RPR TITER: RPR Titer: 1:1 {titer} — ABNORMAL HIGH

## 2018-06-06 LAB — RPR: RPR: REACTIVE — AB

## 2018-06-06 MED ORDER — ALBUTEROL SULFATE 108 (90 BASE) MCG/ACT IN AEPB: 1.0000 | INHALATION_SPRAY | RESPIRATORY_TRACT | 11 refills | Status: DC | PRN

## 2018-06-06 NOTE — Telephone Encounter (Signed)
Called [facility] spoke w/tech [place] inform pt is opk to used. Been using since 09/2017.Marland KitchenJohny Chess

## 2018-06-06 NOTE — Progress Notes (Signed)
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 Pulmonary Consult    Last seen 08/11/15 and was referred back by Dr Scarlette Calico. Pt c/o increased SOB for the past 6 months. He uses his albuterol inhaler 3-4 x per day. He also c/o non prod cough.  He states he gets SOB walking approx 20 ft. He also has a non prod cough. He is using   progressive doe x 6 m to point of room to room (note not reproducible here) assoc with dry cough neither symptom improves much with saba up to qid and can't sleep due to choking sensations after asleep for just an hour or so. rec Plan A = Automatic = stop anoro and restart Dulera 100 Take 2 puffs first thing in am and then another 2 puffs about 12 hours later.  Work on inhaler technique:  relax and gently blow all the way out then  Plan B = Backup Only use your albuterol (ventolin) as a rescue medication   Please schedule a follow up office visit in 4 weeks, sooner if needed  with all medications /inhalers/ solutions in hand so we can verify exactly what you are taking. This includes all medications from all doctors and over the counters - with PFTs on return     11/09/2017  f/u ov/Wert re: GOLD 0 / still smoking - still on anoro / no meds as req  Chief Complaint  Patient presents with  . Follow-up     PFT, SOB w/activity and at rest,cough w/ beige sputum,wakes up at night w/ dry mouth,has never started dulera inhaler  Dyspnea:  Room to room if walks fast, better slow MMRC2 = can't walk a nl pace on a flat grade s sob but does fine slow and flat  Cough: mostly dry worse hs like choking  Sleep: disturbed by dry mouth/ gasping for air  saba still using q am  rec Plan A = Automatic = stop anoro and restart Dulera 100 Take 2 puffs first thing in am and then another 2 puffs about 12 hours later.  Work on inhaler technique:  Plan B = Backup Only use your albuterol (ventolin) as a rescue medication  The key is to stop smoking completely before smoking completely stops you!     02/26/2018  f/u ov/Wert re:  Technically only GOLD 0 / still smoking  Chief Complaint  Patient presents with  . Follow-up    Breathing has improved some since he started pacing himself more.    Dyspnea:  MMRC2 = can't walk a nl pace on a flat grade s sob but does fine slow and flat    Cough:  Minimal  Sleep:  doing fine off cpap SABA use:  None  Rhinitis worse day > noct  Zyrtec no better  Not taking ppi ac as rec  rec  Nexium should be Take 30-60 min before first meal of the day  Work on inhaler technique:  For drainage / throat tickle try take CHLORPHENIRAMINE  4 mg - take one every 4 hours as needed - available over the counter- may cause drowsiness so start with just a bedtime dose or two and see how you tolerate it before trying in daytime   The key is to stop smoking completely before smoking completely stops you!     06/06/2018  f/u ov/Wert re: anoro / gold 0 copd/ still smoking  Chief Complaint  Patient presents with  . Follow-up    Breathing is unchanged since last visit. He has been having nose bleeds- ct sinus to be scheduled per PCP. He is using his proair inhaler 4 x per day on average.   Dyspnea:  MMRC2 = can't walk a nl pace on a flat grade s sob but does fine slow and flat  Cough: minimal /   Sleeping: on side / flat bed / no cpap SABA use: up to 4 x daily when active outdoors, not much indoors  02: no    No obvious day to day or daytime variability or assoc excess/ purulent sputum or mucus plugs or hemoptysis or cp or chest tightness, subjective wheeze or overt  hb symptoms.   Sleeping as above  without nocturnal  or early am exacerbation  of respiratory  c/o's or need for noct saba. Also denies any obvious fluctuation of symptoms with weather or environmental changes or other aggravating or alleviating factors except as outlined above   No unusual exposure hx or h/o childhood pna/ asthma or knowledge of premature birth.  Current Allergies, Complete Past Medical History, Past Surgical History, Family History, and Social History were reviewed in [employer] record.  ROS  The following are not active complaints unless bolded Hoarseness, sore throat, dysphagia, dental problems, itching, sneezing,  nasal congestion or discharge of excess mucus or purulent secretions, ear ache,   fever, chills, sweats, unintended wt loss or wt gain, classically pleuritic or exertional cp,  orthopnea pnd or arm/hand swelling  or leg swelling, presyncope, palpitations, abdominal pain, anorexia, nausea, vomiting, diarrhea  or change in bowel habits or change in bladder habits, change in stools or change in urine, dysuria, hematuria,  rash, arthralgias, visual complaints, headache, numbness, weakness or ataxia or problems with walking or coordination,  change in mood or  memory.        Current Meds  Medication Sig  . Albuterol Sulfate (PROAIR HFA IN) Inhale 2 puffs into the lungs every 4 (four) hours as needed.  . carvedilol (COREG) 12.5 MG tablet Take 1 tablet (12.5 mg total) by mouth 2 (two) times daily with a meal. Follow- appt due in June must see provider for future refills  . cetirizine (ZYRTEC) 10 MG tablet Take 1 tablet (10 mg total) by mouth daily.  . diclofenac sodium  (VOLTAREN) 1 % GEL Apply 2 g topically 4 (four) times daily.  Marland Kitchen emtricitabine-tenofovir (TRUVADA) 200-300 MG tablet Take 1 tablet by mouth daily.  Marland Kitchen esomeprazole (NEXIUM) 40 MG capsule Take 1 capsule (40 mg total) by mouth every morning.  . fluticasone (FLONASE) 50 MCG/ACT nasal spray Place 2 sprays into both nostrils daily.  . pravastatin (PRAVACHOL) 40 MG tablet Take 1 tablet (40 mg  total) by mouth daily.  . traZODone (DESYREL) 100 MG tablet Take 3 tablets (300 mg total) by mouth at bedtime.  . Turmeric 500 MG CAPS Take 1 capsule by mouth 2 (two) times daily.  Marland Kitchen umeclidinium-vilanterol (ANORO ELLIPTA) 62.5-25 MCG/INH AEPB Inhale 1 puff into the lungs daily.  2s          Objective:   Physical Exam   amb bf nad   06/06/2018        224 11/09/2017        209   10/12/17 202 lb (91.6 kg)  10/09/17 204 lb 9.6 oz (92.8 kg)  10/03/17 201 lb (91.2 kg)      Vital signs reviewed - Note on arrival 02 sats  98% on RA      HEENT: nl dentition,  and oropharynx. Nl external ear canals without cough reflex - moderate bilateral non-specific turbinate edema  / full dentures    NECK :  without JVD/Nodes/TM/ nl carotid upstrokes bilaterally   LUNGS: no acc muscle use,  Nl contour chest which is clear to A and P bilaterally without cough on insp or exp maneuvers   CV:  RRR  no s3 or murmur or increase in P2, and no edema   ABD:  soft and nontender with nl inspiratory excursion in the supine position. No bruits or organomegaly appreciated, bowel sounds nl  MS:  Nl gait/ ext warm without deformities, calf tenderness, cyanosis or clubbing No obvious joint restrictions   SKIN: warm and dry without lesions    NEURO:  alert, approp, nl sensorium with  no motor or cerebellar deficits apparent.                     Assessment:

## 2018-06-06 NOTE — Assessment & Plan Note (Signed)
4-5 min discussion re active cigarette smoking in addition to office E&M  Ask about tobacco use:   Ongoing, no reduction Advise quitting    I reviewed the Fletcher curve with the patient that basically indicates  if you quit smoking when your best day FEV1 is still well preserved (as is clearly  the case here)  it is highly unlikely you will progress to severe disease and informed the patient there was  no medication on the market that has proven to alter the curve/ its downward trajectory  or the likelihood of progression of their disease(unlike other chronic medical conditions such as atheroclerosis where we do think we can change the natural hx with risk reducing meds)    Therefore stopping smoking and maintaining abstinence are  the most important aspects of care, not choice of inhalers or for that matter, doctors. Treatment other than smoking cessation  is entirely directed by severity of symptoms and focused also on reducing exacerbations, not attempting to change the natural history of the disease.   Assess willingness:  Not committed at this point Assist in quit attempt:  Per PCP already in progress  Arrange follow up:   Follow up per Primary Care planned

## 2018-06-06 NOTE — Patient Instructions (Addendum)
Plan A = Automatic = Anoro one click each am  Work on inhaler technique:  relax and gently blow all the way out then take a nice smooth deep breath back in,  Hold for up to 5 seconds if you can.  Rinse and gargle with water when done     Plan B = Backup Only use your albuterol (proair or proair respiclick)  as a rescue medication to be used if you can't catch your breath by resting or doing a relaxed purse lip breathing pattern.  - The less you use it, the better it will work when you need it. - Ok to use the inhaler up to 2 puffs  every 4 hours if you must but call for appointment if use goes up over your usual need - Don't leave home without it !!  (think of it like the spare tire for your car)     The key is to stop smoking completely before smoking completely stops you - it's not too late     If you are satisfied with your treatment plan,  let your doctor know and he/she can either refill your medications or you can return here when your prescription runs out.     If in any way you are not 100% satisfied,  please tell us.  If 100% better, tell your friends!  Pulmonary follow up is as needed

## 2018-06-06 NOTE — Assessment & Plan Note (Signed)
-   pft's 10/28/09 FEV1 3.16 (70%) ratio 73 with air trapping and dloc 49% corrects to 69% for alv volume - 10/12/2017   Walked RA  2 laps @ 185 ft each stopped due to  Sob , no desats - PFT's  11/09/2017  FEV1 2.31 (66% ) ratio 69  p no % improvement from saba p anoro prior to study with DLCO  44 % corrects to 57 % for alv volume   If use fev1 2.36 vs VC 3.79 =  62%  With mild curvature confirming obst  - 06/06/2018  After extensive coaching inhaler device,  effectiveness =    75% with hfa from a baseline of 50% so rec try respiclick   Way over using hfa at baseline I spent extra time with pt today reviewing appropriate use of albuterol for prn use on exertion with the following points: 1) saba is for relief of sob that does not improve by walking a slower pace or resting but rather if the pt does not improve after trying this first. 2) If the pt is convinced, as many are, that saba helps recover from activity faster then it's easy to tell if this is the case by re-challenging : ie stop, take the inhaler, then p 5 minutes try the exact same activity (intensity of workload) that just caused the symptoms and see if they are substantially diminished or not after saba 3) if there is an activity that reproducibly causes the symptoms, try the saba 15 min before the activity on alternate days   If in fact the saba really does help, then fine to continue to use it prn but advised may need to look closer at the maintenance regimen being used to achieve better control of airways disease with exertion.   Overall despite active smoking he is doing relatively well and pulmonary f/u can be prn    I had an extended discussion with the patient reviewing all relevant studies completed to date and  lasting 15 to 20 minutes of a 25 minute visit    See device teaching which extended face to face time for this visit.  Each maintenance medication was reviewed in detail including emphasizing most importantly the difference  between maintenance and prns and under what circumstances the prns are to be triggered using an action plan format that is not reflected in the computer generated alphabetically organized AVS which I have not found useful in most complex patients, especially with respiratory illnesses  Please see AVS for specific instructions unique to this visit that I personally wrote and verbalized to the the pt in detail and then reviewed with pt  by my nurse highlighting any  changes in therapy recommended at today's visit to their plan of care.

## 2018-06-11 ENCOUNTER — Other Ambulatory Visit: Payer: Self-pay | Admitting: Podiatry

## 2018-06-11 MED ORDER — ALBUTEROL SULFATE HFA 108 (90 BASE) MCG/ACT IN AERS: 2.0000 | INHALATION_SPRAY | RESPIRATORY_TRACT | 3 refills | Status: DC | PRN

## 2018-06-11 NOTE — Addendum Note (Signed)
Addended by: Aviva Signs M on: 06/11/2018 03:29 PM   Modules accepted: Orders

## 2018-06-12 ENCOUNTER — Encounter: Payer: Self-pay | Admitting: Internal Medicine

## 2018-06-14 ENCOUNTER — Encounter: Payer: Self-pay | Admitting: Internal Medicine

## 2018-06-14 ENCOUNTER — Ambulatory Visit (INDEPENDENT_AMBULATORY_CARE_PROVIDER_SITE_OTHER)
Admission: RE | Admit: 2018-06-14 | Discharge: 2018-06-14 | Disposition: A | Discharge status: Home / Self Care | Payer: Medicare Other | Source: Ambulatory Visit | Attending: Internal Medicine | Admitting: Internal Medicine

## 2018-06-14 DIAGNOSIS — J3489 Other specified disorders of nose and nasal sinuses: Secondary | ICD-10-CM | POA: Diagnosis not present

## 2018-06-14 DIAGNOSIS — R040 Epistaxis: Secondary | ICD-10-CM | POA: Diagnosis not present

## 2018-06-15 ENCOUNTER — Ambulatory Visit (INDEPENDENT_AMBULATORY_CARE_PROVIDER_SITE_OTHER): Payer: Medicare Other | Admitting: Podiatry

## 2018-06-15 ENCOUNTER — Encounter: Payer: Self-pay | Admitting: Podiatry

## 2018-06-15 DIAGNOSIS — E119 Type 2 diabetes mellitus without complications: Secondary | ICD-10-CM

## 2018-06-15 DIAGNOSIS — L84 Corns and callosities: Secondary | ICD-10-CM | POA: Diagnosis not present

## 2018-06-15 DIAGNOSIS — M79674 Pain in right toe(s): Secondary | ICD-10-CM | POA: Diagnosis not present

## 2018-06-15 DIAGNOSIS — B351 Tinea unguium: Secondary | ICD-10-CM | POA: Diagnosis not present

## 2018-06-15 DIAGNOSIS — M79675 Pain in left toe(s): Secondary | ICD-10-CM

## 2018-06-15 NOTE — Progress Notes (Signed)
This patient presents to the office with chief complaint of long thick nails and diabetic feet.  This patient  says there  is   pain and discomfort due to callus under big toe joint left foot..  This patient says there are long thick painful nails.  These nails are painful walking and wearing shoes.  Patient has no history of infection or drainage from both feet.  Patient is unable to  self treat his own nails . This patient presents  to the office today for treatment of the  long nails and a foot evaluation due to history of  Diabetes.   General Appearance  Alert, conversant and in no acute stress.  Vascular  Dorsalis pedis and posterior tibial  pulses are palpable  bilaterally.  Capillary return is within normal limits  bilaterally. Temperature is within normal limits  bilaterally.  Neurologic  Senn-Weinstein monofilament wire test within normal limits  bilaterally. Muscle power within normal limits bilaterally.  Nails Thick disfigured discolored nails with subungual debris  from hallux to fifth toes bilaterally. No evidence of bacterial infection or drainage bilaterally.  Orthopedic  No limitations of motion of motion feet .  No crepitus or effusions noted.  No bony pathology or digital deformities noted.  Skin  normotropic skin  noted bilaterally.  No signs of infections or ulcers noted.   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  Onychomycosis  Diabetes with no foot complications  Callus left foot.  IE  Debride nails x 10.  A diabetic foot exam was performed and there is no evidence of any vascular or neurologic pathology.   RTC 3 months. Debridement of porokeratosis left forefoot.   Gardiner Barefoot DPM

## 2018-06-18 ENCOUNTER — Other Ambulatory Visit: Payer: Self-pay | Admitting: Internal Medicine

## 2018-06-18 DIAGNOSIS — J301 Allergic rhinitis due to pollen: Secondary | ICD-10-CM

## 2018-06-21 ENCOUNTER — Emergency Department (HOSPITAL_COMMUNITY)
Admission: EM | Admit: 2018-06-21 | Discharge: 2018-06-21 | Disposition: A | Discharge status: Home / Self Care | Payer: Medicare Other | Attending: Emergency Medicine | Admitting: Emergency Medicine

## 2018-06-21 ENCOUNTER — Encounter (HOSPITAL_COMMUNITY): Payer: Self-pay | Admitting: *Deleted

## 2018-06-21 ENCOUNTER — Other Ambulatory Visit: Payer: Self-pay

## 2018-06-21 DIAGNOSIS — F17210 Nicotine dependence, cigarettes, uncomplicated: Secondary | ICD-10-CM | POA: Diagnosis not present

## 2018-06-21 DIAGNOSIS — E119 Type 2 diabetes mellitus without complications: Secondary | ICD-10-CM | POA: Insufficient documentation

## 2018-06-21 DIAGNOSIS — R221 Localized swelling, mass and lump, neck: Secondary | ICD-10-CM | POA: Diagnosis present

## 2018-06-21 DIAGNOSIS — L0211 Cutaneous abscess of neck: Secondary | ICD-10-CM | POA: Diagnosis not present

## 2018-06-21 DIAGNOSIS — Z85118 Personal history of other malignant neoplasm of bronchus and lung: Secondary | ICD-10-CM | POA: Diagnosis not present

## 2018-06-21 DIAGNOSIS — I110 Hypertensive heart disease with heart failure: Secondary | ICD-10-CM | POA: Insufficient documentation

## 2018-06-21 DIAGNOSIS — E782 Mixed hyperlipidemia: Secondary | ICD-10-CM | POA: Diagnosis not present

## 2018-06-21 DIAGNOSIS — Z79899 Other long term (current) drug therapy: Secondary | ICD-10-CM | POA: Insufficient documentation

## 2018-06-21 DIAGNOSIS — J45909 Unspecified asthma, uncomplicated: Secondary | ICD-10-CM | POA: Diagnosis not present

## 2018-06-21 DIAGNOSIS — I509 Heart failure, unspecified: Secondary | ICD-10-CM | POA: Insufficient documentation

## 2018-06-21 DIAGNOSIS — L739 Follicular disorder, unspecified: Secondary | ICD-10-CM

## 2018-06-21 MED ORDER — LIDOCAINE HCL 2 % IJ SOLN
10.0000 mL | Freq: Once | INTRAMUSCULAR | Status: AC
  Administered 2018-06-21: 200 mg via INTRADERMAL
  Filled 2018-06-21: qty 20
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
## 2018-06-21 NOTE — ED Notes (Signed)
Pt verbalized understanding of discharge instructions and denies any further questions at this time.   

## 2018-06-21 NOTE — Discharge Instructions (Addendum)
Keep the area clean and dry.  Keep the area covered.  Return here as needed.

## 2018-06-21 NOTE — ED Notes (Signed)
ED Provider at bedside. 

## 2018-06-21 NOTE — ED Triage Notes (Signed)
Pt in reports  Hx of abcess with 2 cm x 1 cm abcess to R chin area, pt denies fever and chills, pt A&O x4. Pt no longer takes meds  For DM, pt controls BS with diet and exercise, last A1c 6.2

## 2018-06-22 DIAGNOSIS — M222X1 Patellofemoral disorders, right knee: Secondary | ICD-10-CM | POA: Diagnosis not present

## 2018-06-22 DIAGNOSIS — M25562 Pain in left knee: Secondary | ICD-10-CM | POA: Diagnosis not present

## 2018-06-22 DIAGNOSIS — M222X2 Patellofemoral disorders, left knee: Secondary | ICD-10-CM | POA: Diagnosis not present

## 2018-06-22 DIAGNOSIS — M25561 Pain in right knee: Secondary | ICD-10-CM | POA: Diagnosis not present

## 2018-06-25 ENCOUNTER — Encounter: Payer: Self-pay | Admitting: Internal Medicine

## 2018-06-26 ENCOUNTER — Ambulatory Visit: Payer: Medicare Other | Attending: Orthopedic Surgery | Admitting: Physical Therapy

## 2018-06-26 ENCOUNTER — Other Ambulatory Visit: Payer: Self-pay

## 2018-06-26 ENCOUNTER — Encounter: Payer: Self-pay | Admitting: Physical Therapy

## 2018-06-26 DIAGNOSIS — G8929 Other chronic pain: Secondary | ICD-10-CM | POA: Diagnosis not present

## 2018-06-26 DIAGNOSIS — M25562 Pain in left knee: Secondary | ICD-10-CM | POA: Diagnosis not present

## 2018-06-26 DIAGNOSIS — M25652 Stiffness of left hip, not elsewhere classified: Secondary | ICD-10-CM | POA: Diagnosis not present

## 2018-06-26 DIAGNOSIS — M25651 Stiffness of right hip, not elsewhere classified: Secondary | ICD-10-CM | POA: Insufficient documentation

## 2018-06-26 DIAGNOSIS — M6281 Muscle weakness (generalized): Secondary | ICD-10-CM | POA: Diagnosis not present

## 2018-06-26 DIAGNOSIS — M25561 Pain in right knee: Secondary | ICD-10-CM | POA: Insufficient documentation
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
## 2018-07-02 ENCOUNTER — Encounter: Payer: Self-pay | Admitting: Physical Therapy

## 2018-07-02 ENCOUNTER — Ambulatory Visit: Payer: Medicare Other | Admitting: Physical Therapy

## 2018-07-02 DIAGNOSIS — M25561 Pain in right knee: Principal | ICD-10-CM

## 2018-07-02 DIAGNOSIS — G8929 Other chronic pain: Secondary | ICD-10-CM | POA: Diagnosis not present

## 2018-07-02 DIAGNOSIS — M25652 Stiffness of left hip, not elsewhere classified: Secondary | ICD-10-CM | POA: Diagnosis not present

## 2018-07-02 DIAGNOSIS — M25562 Pain in left knee: Secondary | ICD-10-CM | POA: Diagnosis not present

## 2018-07-02 DIAGNOSIS — M25651 Stiffness of right hip, not elsewhere classified: Secondary | ICD-10-CM | POA: Diagnosis not present

## 2018-07-02 DIAGNOSIS — M6281 Muscle weakness (generalized): Secondary | ICD-10-CM
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
## 2018-07-06 ENCOUNTER — Ambulatory Visit: Payer: Medicare Other | Admitting: Physical Therapy

## 2018-07-09 ENCOUNTER — Other Ambulatory Visit: Payer: Self-pay

## 2018-07-09 NOTE — Patient Outreach (Signed)
[facility]) Care Management  07/09/2018  [name] [birth year] [id number]   Medication Adherence call to [name] left a message for patient to call back patient is du eon Pravastatin 40 mg under [facility].   [place] Management Direct Dial [phone]  Fax [phone] [email or web address]

## 2018-07-10 ENCOUNTER — Ambulatory Visit: Payer: Medicare Other | Admitting: Physical Therapy

## 2018-07-10 IMAGING — CT CT CHEST W/ CM
2 of 4 series · 15 of 36 positions shown, 18 images · IV contrast (ISOVUE 300)
Comparison: CT 09/09/2016 and 08/05/2016.

CLINICAL DATA: Lung cancer diagnosed in August 2015. Status post
right upper lobe resection. No subsequent therapy.

EXAM:
CT CHEST WITH CONTRAST
TECHNIQUE: Multidetector CT imaging of the chest was performed during
intravenous contrast administration.
CONTRAST:  75mL [id number] IOPAMIDOL ([id number]) INJECTION 61%

[Series 2: axial st · axial · 0.76mm/px · z∈[-238,+34]mm · 12 of 162 slices shown, 15 images]
[im 13/162  mediastinal]
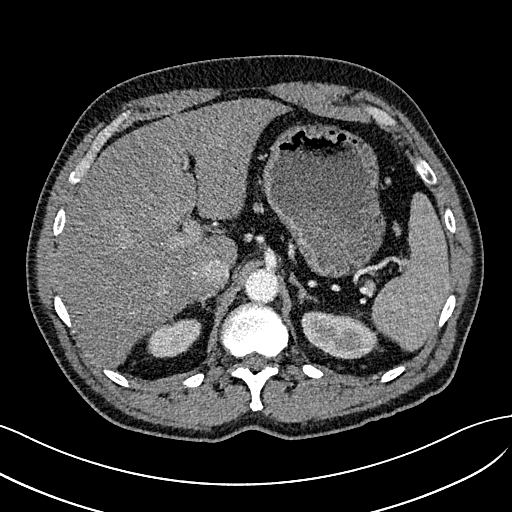
[im 13/162  lung]
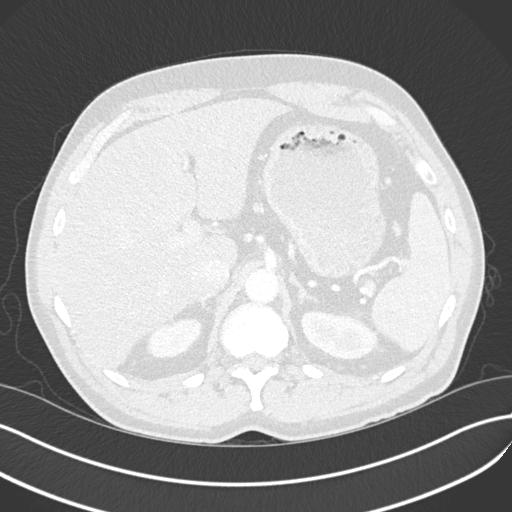
[im 25/162  lung]
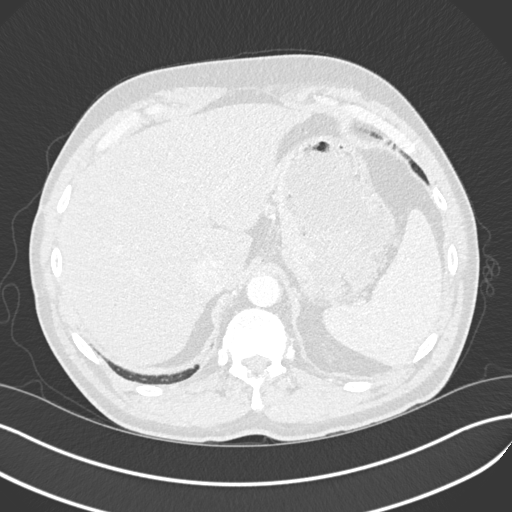
[im 38/162  lung]
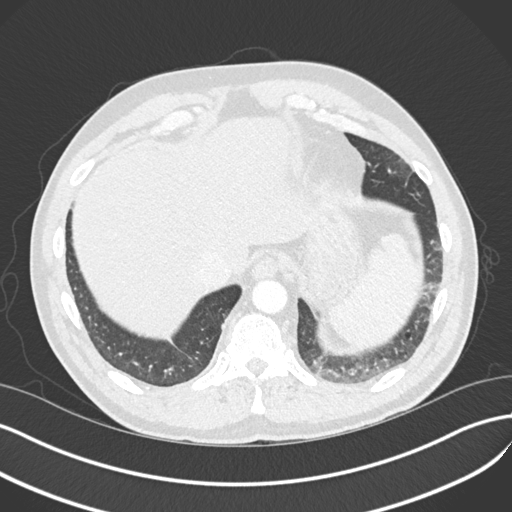
[im 50/162  lung]
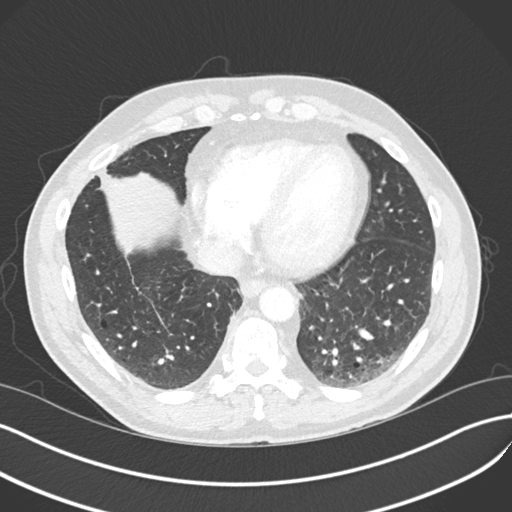
[im 62/162  mediastinal]
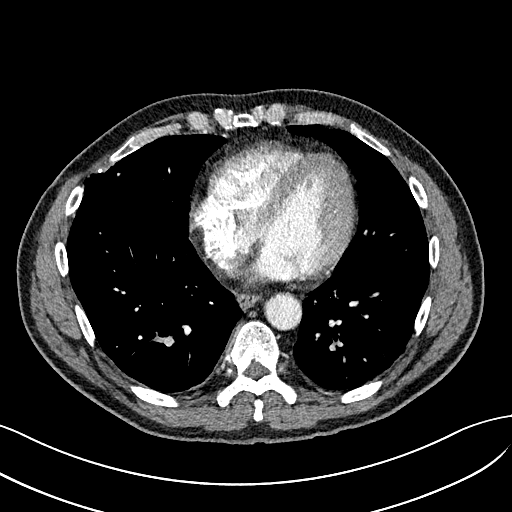
[im 62/162  lung]
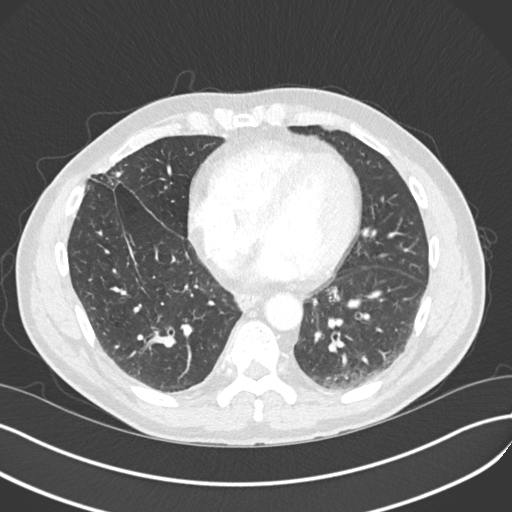
[im 75/162  lung]
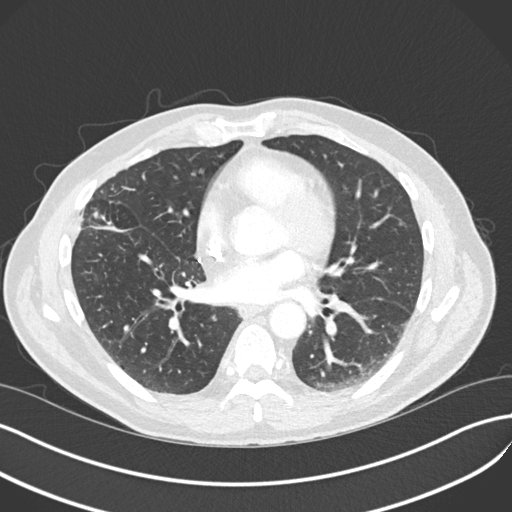
[im 87/162  lung]
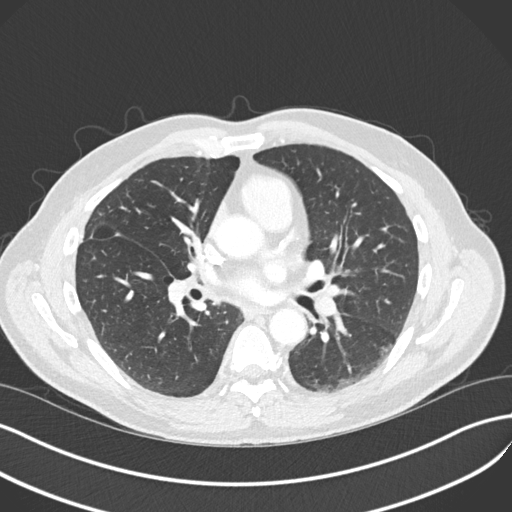
[im 100/162  lung]
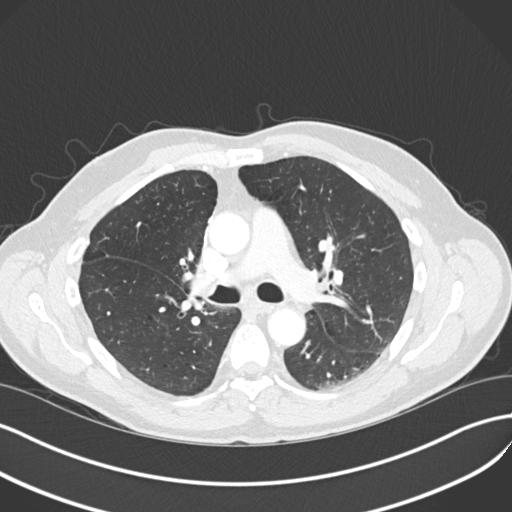
[im 112/162  mediastinal]
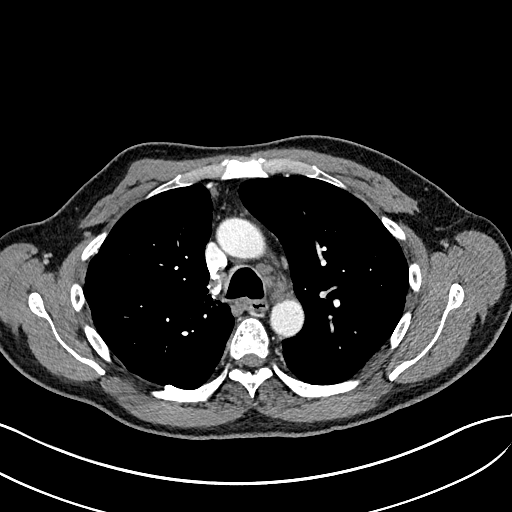
[im 112/162  lung]
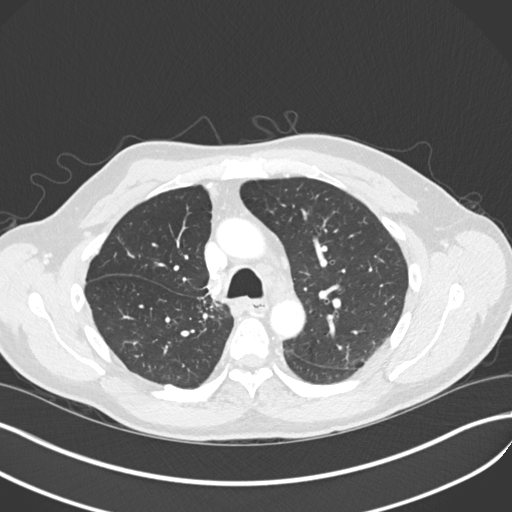
[im 124/162  lung]
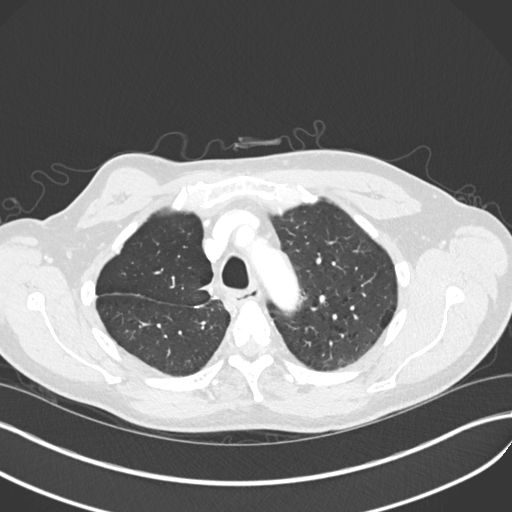
[im 137/162  lung]
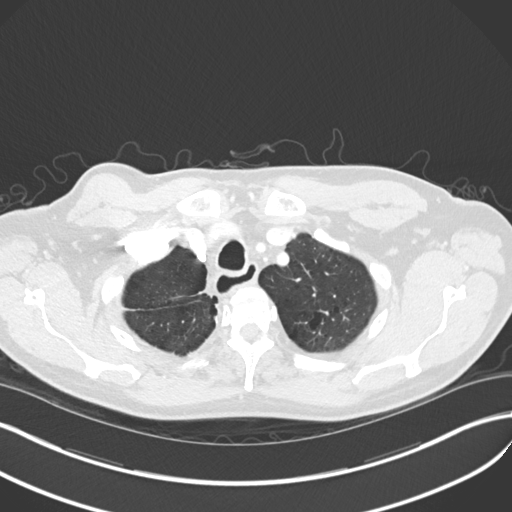
[im 149/162  lung]
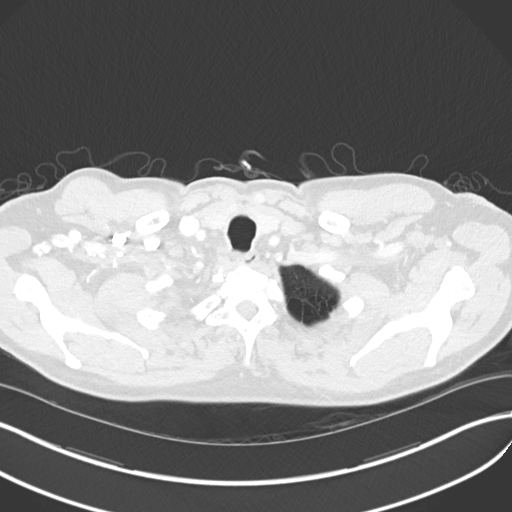

[Series 5: coronal · coronal · 0.70mm/px · 3 of 126 slices shown]
[im 26/126  lung]
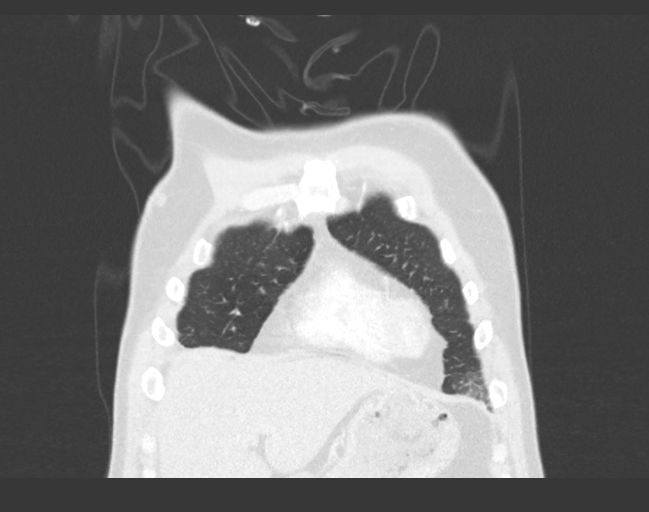
[im 51/126  lung]
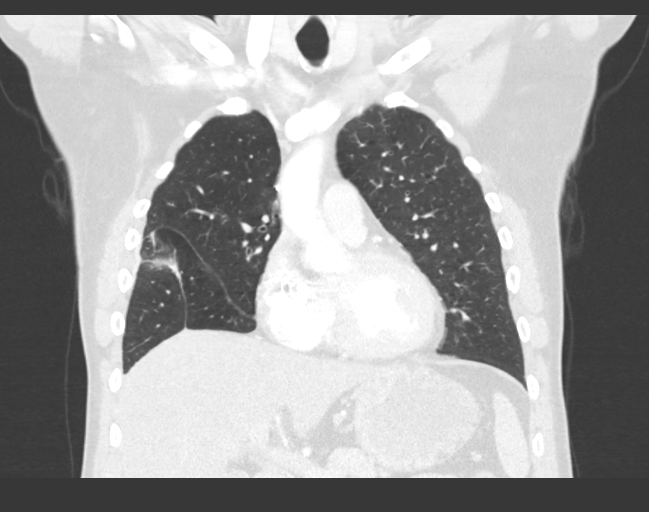
[im 76/126  lung]
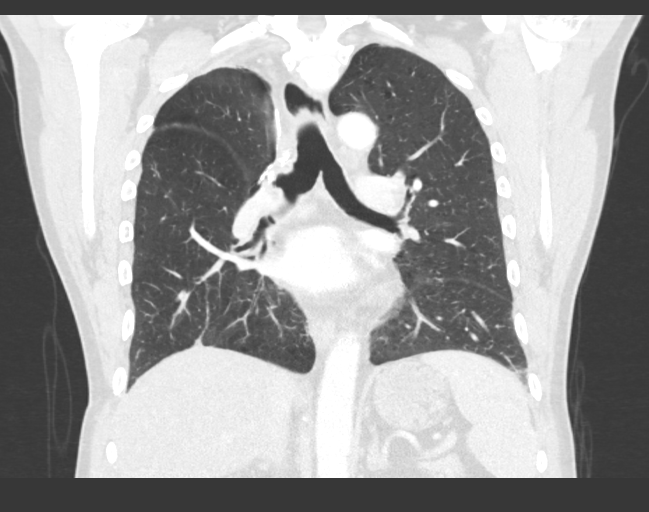

[15 of 36 positions shown; findings below may reference images not displayed]

FINDINGS: Cardiovascular: Stable mild atherosclerosis of the aorta, great
vessels and coronary arteries. No acute vascular findings are seen.
The heart size is normal. There is no pericardial effusion.

Mediastinum/Nodes: Small AP window and subcarinal lymph nodes are
stable. There is no progressive mediastinal, hilar or axillary
lymphadenopathy. The thyroid gland, trachea and esophagus
demonstrate no significant findings.

Lungs/Pleura: There is no pleural effusion. There are stable
postsurgical changes status post right upper lobe resection. Mild
right lung scarring appears stable. There is mild emphysema and
central airway thickening. No suspicious pulmonary nodule or
confluent airspace opacity.

Upper abdomen: The visualized upper abdomen appears stable without
acute findings. There is hepatic low density consistent with
steatosis, similar to previous study.

Musculoskeletal/Chest wall: There is no chest wall mass or
suspicious osseous finding. Thoracotomy changes on the right.
IMPRESSION: 1. Stable postoperative chest. No evidence of local recurrence or
metastatic disease.
2. Stable emphysema and mild pulmonary scarring.
3. Stable small mediastinal lymph nodes.  No progressive adenopathy.
4.  Aortic Atherosclerosis (9K907-A08.8).

## 2018-07-12 ENCOUNTER — Encounter: Payer: Self-pay | Admitting: Physical Therapy

## 2018-07-17 ENCOUNTER — Telehealth: Payer: Self-pay | Admitting: Physical Therapy

## 2018-07-17 ENCOUNTER — Ambulatory Visit: Payer: Medicare Other | Admitting: Physical Therapy

## 2018-07-17 NOTE — Telephone Encounter (Signed)
Left message on voicemail about missed visit.  Date and time of next visit given.  Our phone number was given for him to call if he is unable to attend, or if he is  no longer in need of PT.  I asked him to refer to the policy for missed visits. Melvenia Needles PTA

## 2018-07-19 ENCOUNTER — Encounter: Payer: Self-pay | Admitting: Physical Therapy

## 2018-07-19 ENCOUNTER — Ambulatory Visit: Payer: Medicare Other | Admitting: Physical Therapy

## 2018-07-19 DIAGNOSIS — G8929 Other chronic pain: Secondary | ICD-10-CM | POA: Diagnosis not present

## 2018-07-19 DIAGNOSIS — M25651 Stiffness of right hip, not elsewhere classified: Secondary | ICD-10-CM

## 2018-07-19 DIAGNOSIS — M25562 Pain in left knee: Secondary | ICD-10-CM | POA: Diagnosis not present

## 2018-07-19 DIAGNOSIS — M25652 Stiffness of left hip, not elsewhere classified: Secondary | ICD-10-CM | POA: Diagnosis not present

## 2018-07-19 DIAGNOSIS — M25561 Pain in right knee: Secondary | ICD-10-CM | POA: Diagnosis not present

## 2018-07-19 DIAGNOSIS — M6281 Muscle weakness (generalized): Secondary | ICD-10-CM | POA: Diagnosis not present
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
## 2018-07-24 ENCOUNTER — Telehealth: Payer: Self-pay | Admitting: Physical Therapy

## 2018-07-24 ENCOUNTER — Ambulatory Visit: Payer: Medicare Other | Admitting: Physical Therapy

## 2018-07-24 NOTE — Telephone Encounter (Signed)
Spoke with patient regarding 2nd no-show visit. Spoke with patient about attendance policy. He was advised he will be discharged for a third no-show appointment. He reports he will be at his next appointment on 07/26/2018.

## 2018-07-26 ENCOUNTER — Encounter: Payer: Self-pay | Admitting: Physical Therapy

## 2018-07-26 ENCOUNTER — Ambulatory Visit: Payer: Medicare Other | Admitting: Physical Therapy

## 2018-07-26 DIAGNOSIS — M25561 Pain in right knee: Principal | ICD-10-CM

## 2018-07-26 DIAGNOSIS — M25652 Stiffness of left hip, not elsewhere classified: Secondary | ICD-10-CM | POA: Diagnosis not present

## 2018-07-26 DIAGNOSIS — M25562 Pain in left knee: Secondary | ICD-10-CM | POA: Diagnosis not present

## 2018-07-26 DIAGNOSIS — M25651 Stiffness of right hip, not elsewhere classified: Secondary | ICD-10-CM | POA: Diagnosis not present

## 2018-07-26 DIAGNOSIS — G8929 Other chronic pain: Secondary | ICD-10-CM

## 2018-07-26 DIAGNOSIS — M6281 Muscle weakness (generalized): Secondary | ICD-10-CM | POA: Diagnosis not present
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
## 2018-07-27 ENCOUNTER — Encounter: Payer: Self-pay | Admitting: Physical Therapy

## 2018-07-31 ENCOUNTER — Encounter: Payer: Self-pay | Admitting: Physical Therapy

## 2018-08-02 ENCOUNTER — Telehealth: Payer: Self-pay | Admitting: Physical Therapy

## 2018-08-02 ENCOUNTER — Ambulatory Visit: Payer: Medicare Other | Attending: Orthopedic Surgery | Admitting: Physical Therapy

## 2018-08-02 NOTE — Telephone Encounter (Signed)
LVM about missing today's visit, and that it being his 3rd missed appointment that per the clinic policy it is grounds for discharge. Perhaps based on limited attendance physical therapy isn't appropriate at this time. If he has any questions he is welcome to call the clinic.

## 2018-08-06 ENCOUNTER — Ambulatory Visit: Payer: Medicare Other | Admitting: Physical Therapy

## 2018-08-09 DIAGNOSIS — M65331 Trigger finger, right middle finger: Secondary | ICD-10-CM | POA: Diagnosis not present

## 2018-08-09 DIAGNOSIS — M65321 Trigger finger, right index finger: Secondary | ICD-10-CM | POA: Diagnosis not present

## 2018-08-14 DIAGNOSIS — M25552 Pain in left hip: Secondary | ICD-10-CM | POA: Diagnosis not present

## 2018-08-14 DIAGNOSIS — M13852 Other specified arthritis, left hip: Secondary | ICD-10-CM | POA: Diagnosis not present

## 2018-08-22 DIAGNOSIS — M65331 Trigger finger, right middle finger: Secondary | ICD-10-CM | POA: Diagnosis not present

## 2018-08-22 DIAGNOSIS — M65321 Trigger finger, right index finger: Secondary | ICD-10-CM | POA: Diagnosis not present

## 2018-08-29 ENCOUNTER — Other Ambulatory Visit: Payer: Self-pay

## 2018-08-29 ENCOUNTER — Encounter (HOSPITAL_COMMUNITY): Payer: Self-pay | Admitting: Emergency Medicine

## 2018-08-29 ENCOUNTER — Ambulatory Visit (HOSPITAL_COMMUNITY)
Admission: EM | Admit: 2018-08-29 | Discharge: 2018-08-29 | Disposition: A | Discharge status: Home / Self Care | Payer: Medicare Other | Attending: Family Medicine | Admitting: Family Medicine

## 2018-08-29 DIAGNOSIS — Z4801 Encounter for change or removal of surgical wound dressing: Secondary | ICD-10-CM | POA: Diagnosis not present

## 2018-08-29 DIAGNOSIS — Z5189 Encounter for other specified aftercare: Secondary | ICD-10-CM | POA: Diagnosis not present

## 2018-08-29 NOTE — ED Triage Notes (Signed)
Pt had surgery for Trigger Finger last Wednesday.  Pt here today with his surgical dressing very dirty and ripping off around the surgical site.  Pt states he tried to call his surgeons office to let them know he needed it reinforced but he states that they did not understand what he needed.  He is here to have the stitches recovered so they wont get infected.  His next appointment with the surgeon is next Tuesday.

## 2018-08-29 NOTE — Discharge Instructions (Signed)
We placed new gauze and ace wrap on your hand Stiches looked good- conitnue to moniotr for drainage increased pain  Follow up as planned on Tuesday
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
## 2018-09-04 ENCOUNTER — Encounter (HOSPITAL_COMMUNITY): Payer: Self-pay | Admitting: Emergency Medicine

## 2018-09-04 ENCOUNTER — Emergency Department (HOSPITAL_COMMUNITY): Payer: Medicare Other

## 2018-09-04 ENCOUNTER — Emergency Department (HOSPITAL_COMMUNITY)
Admission: EM | Admit: 2018-09-04 | Discharge: 2018-09-04 | Disposition: A | Discharge status: Home / Self Care | Payer: Medicare Other | Attending: Emergency Medicine | Admitting: Emergency Medicine

## 2018-09-04 ENCOUNTER — Other Ambulatory Visit: Payer: Self-pay

## 2018-09-04 DIAGNOSIS — E119 Type 2 diabetes mellitus without complications: Secondary | ICD-10-CM | POA: Diagnosis not present

## 2018-09-04 DIAGNOSIS — F17210 Nicotine dependence, cigarettes, uncomplicated: Secondary | ICD-10-CM | POA: Diagnosis not present

## 2018-09-04 DIAGNOSIS — R471 Dysarthria and anarthria: Secondary | ICD-10-CM

## 2018-09-04 DIAGNOSIS — I110 Hypertensive heart disease with heart failure: Secondary | ICD-10-CM | POA: Diagnosis not present

## 2018-09-04 DIAGNOSIS — R42 Dizziness and giddiness: Secondary | ICD-10-CM | POA: Diagnosis not present

## 2018-09-04 DIAGNOSIS — J45909 Unspecified asthma, uncomplicated: Secondary | ICD-10-CM | POA: Diagnosis not present

## 2018-09-04 DIAGNOSIS — I509 Heart failure, unspecified: Secondary | ICD-10-CM | POA: Diagnosis not present

## 2018-09-04 DIAGNOSIS — Y908 Blood alcohol level of 240 mg/100 ml or more: Secondary | ICD-10-CM | POA: Diagnosis not present

## 2018-09-04 DIAGNOSIS — R000 Tachycardia, unspecified: Secondary | ICD-10-CM | POA: Diagnosis not present

## 2018-09-04 DIAGNOSIS — Z955 Presence of coronary angioplasty implant and graft: Secondary | ICD-10-CM | POA: Diagnosis not present

## 2018-09-04 DIAGNOSIS — Z85118 Personal history of other malignant neoplasm of bronchus and lung: Secondary | ICD-10-CM | POA: Insufficient documentation

## 2018-09-04 DIAGNOSIS — R4781 Slurred speech: Secondary | ICD-10-CM | POA: Diagnosis not present

## 2018-09-04 DIAGNOSIS — F10929 Alcohol use, unspecified with intoxication, unspecified: Secondary | ICD-10-CM | POA: Insufficient documentation

## 2018-09-04 LAB — COMPREHENSIVE METABOLIC PANEL
ALT: 34 U/L (ref 0–44)
AST: 31 U/L (ref 15–41)
Albumin: 4.2 g/dL (ref 3.5–5.0)
Alkaline Phosphatase: 106 U/L (ref 38–126)
Anion gap: 16 — ABNORMAL HIGH (ref 5–15)
BUN: 7 mg/dL (ref 6–20)
CO2: 19 mmol/L — ABNORMAL LOW (ref 22–32)
Calcium: 8.9 mg/dL (ref 8.9–10.3)
Chloride: 104 mmol/L (ref 98–111)
Creatinine, Ser: 0.85 mg/dL (ref 0.61–1.24)
GFR calc Af Amer: >60 mL/min (ref >60)
GFR calc non Af Amer: >60 mL/min (ref >60)
Glucose, Bld: 134 mg/dL — ABNORMAL HIGH (ref 70–99)
Potassium: 3.9 mmol/L (ref 3.5–5.1)
Sodium: 139 mmol/L (ref 135–145)
Total Bilirubin: 0.8 mg/dL (ref 0.3–1.2)
Total Protein: 7.2 g/dL (ref 6.5–8.1)

## 2018-09-04 LAB — CBC
HCT: 52.2 % — ABNORMAL HIGH (ref 39.0–52.0)
Hemoglobin: 17 g/dL (ref 13.0–17.0)
MCH: 28.7 pg (ref 26.0–34.0)
MCHC: 32.6 g/dL (ref 30.0–36.0)
MCV: 88.2 fL (ref 80.0–100.0)
Platelets: 145 10*3/uL — ABNORMAL LOW (ref 150–400)
RBC: 5.92 MIL/uL — ABNORMAL HIGH (ref 4.22–5.81)
RDW: 16.9 % — ABNORMAL HIGH (ref 11.5–15.5)
WBC: 12.5 10*3/uL — ABNORMAL HIGH (ref 4.0–10.5)
nRBC: 0 % (ref 0.0–0.2)

## 2018-09-04 LAB — CBG MONITORING, ED
GLUCOSE-CAPILLARY: 95 mg/dL (ref 70–99)
Glucose-Capillary: 127 mg/dL — ABNORMAL HIGH (ref 70–99)

## 2018-09-04 LAB — DIFFERENTIAL
Abs Immature Granulocytes: 0.07 10*3/uL (ref 0.00–0.07)
Basophils Absolute: 0.1 10*3/uL (ref 0.0–0.1)
Basophils Relative: 0 %
EOS ABS: 0.1 10*3/uL (ref 0.0–0.5)
Eosinophils Relative: 1 %
Immature Granulocytes: 1 %
LYMPHS ABS: 5.2 10*3/uL — AB (ref 0.7–4.0)
Lymphocytes Relative: 41 %
Monocytes Absolute: 0.8 10*3/uL (ref 0.1–1.0)
Monocytes Relative: 6 %
Neutro Abs: 6.3 10*3/uL (ref 1.7–7.7)
Neutrophils Relative %: 51 %

## 2018-09-04 LAB — I-STAT CHEM 8, ED
BUN: 8 mg/dL (ref 6–20)
CALCIUM ION: 1.07 mmol/L — AB (ref 1.15–1.40)
CHLORIDE: 105 mmol/L (ref 98–111)
Creatinine, Ser: 1.2 mg/dL (ref 0.61–1.24)
Glucose, Bld: 131 mg/dL — ABNORMAL HIGH (ref 70–99)
HCT: 53 % — ABNORMAL HIGH (ref 39.0–52.0)
Hemoglobin: 18 g/dL — ABNORMAL HIGH (ref 13.0–17.0)
Potassium: 3.9 mmol/L (ref 3.5–5.1)
Sodium: 140 mmol/L (ref 135–145)
TCO2: 25 mmol/L (ref 22–32)

## 2018-09-04 LAB — ETHANOL: Alcohol, Ethyl (B): 250 mg/dL — ABNORMAL HIGH (ref <10)

## 2018-09-04 LAB — I-STAT TROPONIN, ED: Troponin i, poc: 0.01 ng/mL (ref 0.00–0.08)

## 2018-09-04 LAB — PROTIME-INR
INR: 0.99
Prothrombin Time: 13 seconds (ref 11.4–15.2)

## 2018-09-04 LAB — APTT: aPTT: 32 seconds (ref 24–36)

## 2018-09-04 NOTE — ED Notes (Signed)
CBG 127 mg/DL

## 2018-09-04 NOTE — ED Notes (Signed)
Patient verbalizes understanding of discharge instructions. Opportunity for questioning and answers were provided. Armband removed by staff, pt discharged from ED home.   Pt advised of AMA procedures and the need to be continuously evaluated but still chose to leave.

## 2018-09-04 NOTE — Discharge Instructions (Addendum)
You were evaluated in the Emergency Department and after careful evaluation, we did not find any emergent condition requiring admission or further testing in the hospital.  Your symptoms today seem to be due to alcohol intoxication.  Please return to the Emergency Department if you experience any worsening of your condition, specifically any recurrence of numbness or weakness or slurred speech.  We encourage you to follow up with a primary care provider.  Thank you for allowing [place] to be a part of your care.
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
## 2018-09-04 NOTE — ED Triage Notes (Signed)
Pt BIB [facility] for a Code Stroke. Pt states he called 9-1-1 because he started to have slurred speech. Pt reports drinking ETOH, 4-5 beers, this date and developing slurred speech at 1815. Pt called 9-1-1 to be brought in for evaluation.
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
## 2018-09-04 NOTE — ED Notes (Signed)
Pt is requesting to leave AMA.

## 2018-09-11 ENCOUNTER — Ambulatory Visit: Payer: Medicare Other | Admitting: Podiatry

## 2018-10-16 ENCOUNTER — Ambulatory Visit: Payer: Medicare Other | Admitting: Internal Medicine

## 2018-10-23 IMAGING — CT CT ANGIO CHEST
2 of 6 series · 18 of 36 positions shown · IV contrast (Omni 300)
Comparison: Prior CT scan of the chest 04/20/2017

CLINICAL DATA: 56-year-old male with left upper chest pain and
shortness of breath for the past week. Patient has a history of lung
cancer status post right upper lobectomy in 1022.

EXAM:
CT ANGIOGRAPHY CHEST WITH CONTRAST
TECHNIQUE: Multidetector CT imaging of the chest was performed using the
standard protocol during bolus administration of intravenous
contrast. Multiplanar CT image reconstructions and MIPs were
obtained to evaluate the vascular anatomy.
CONTRAST:  80 mL Isovue 370

[Series 7: pe thins · axial · 0.70mm/px · z∈[+161,+419]mm · 17 of 292 slices shown]
[im 17/292  lung]
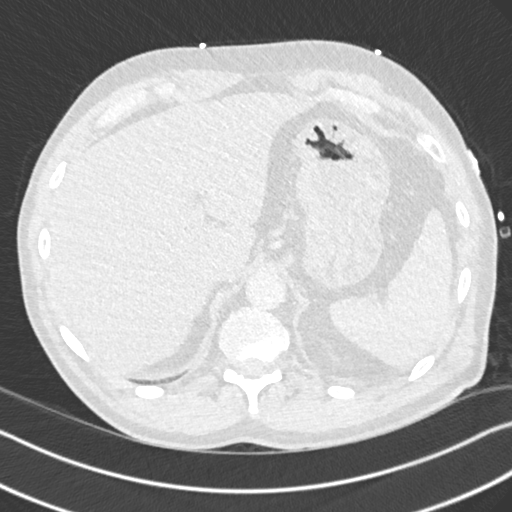
[im 33/292  mediastinal]
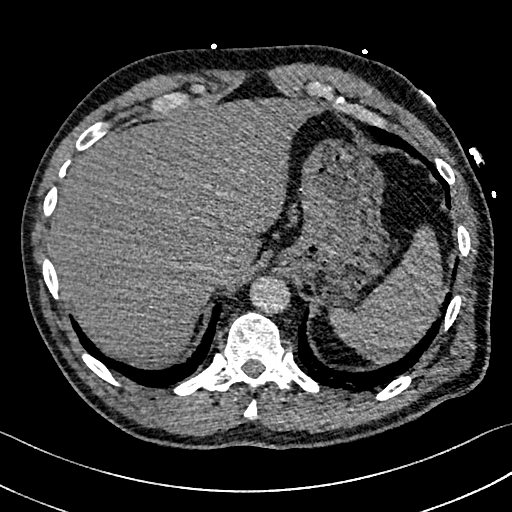
[im 49/292  lung]
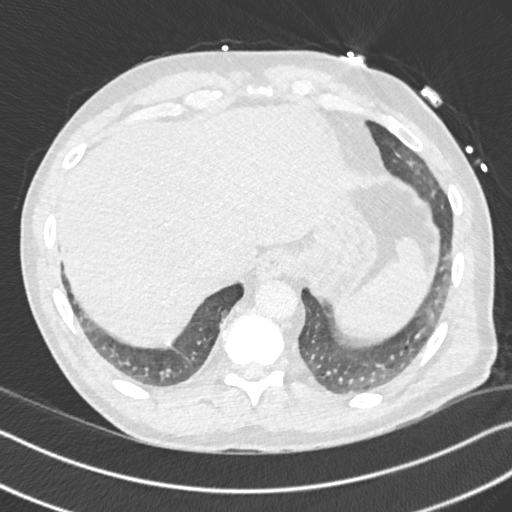
[im 65/292  mediastinal]
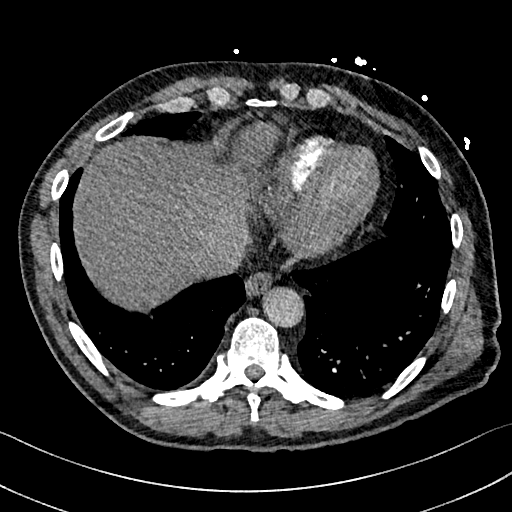
[im 81/292  lung]
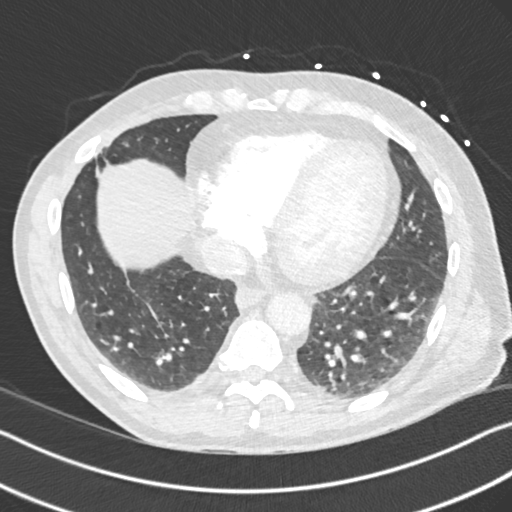
[im 98/292  mediastinal]
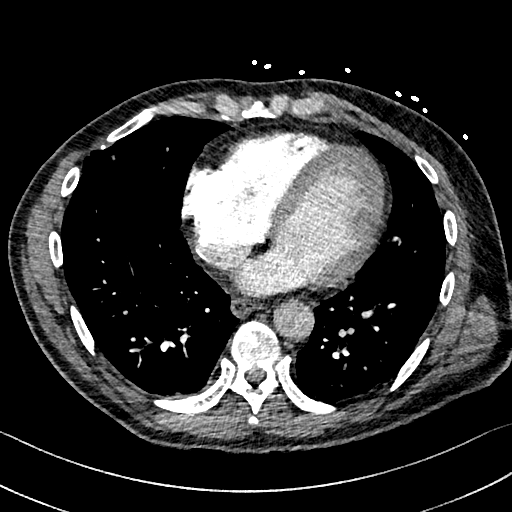
[im 114/292  lung]
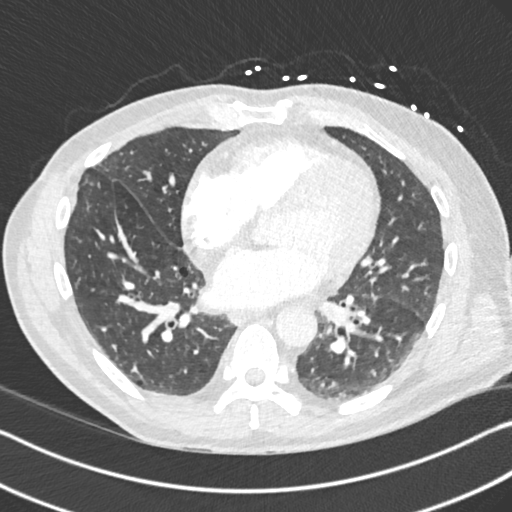
[im 130/292  mediastinal]
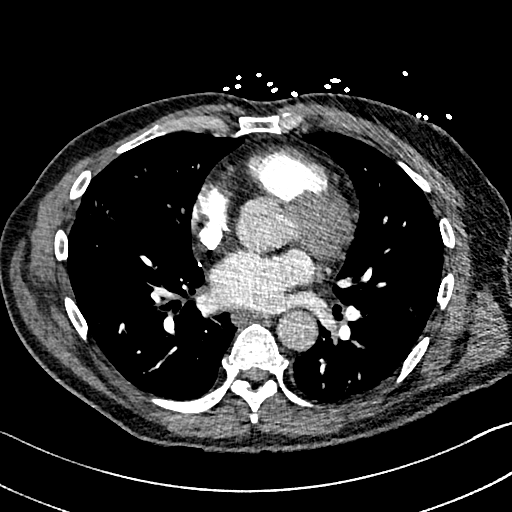
[im 146/292  lung]
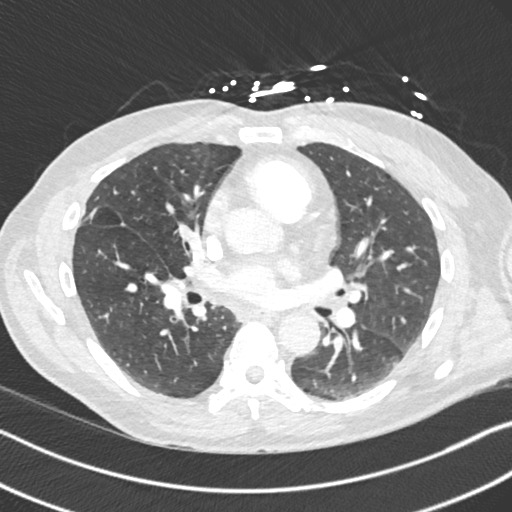
[im 162/292  mediastinal]
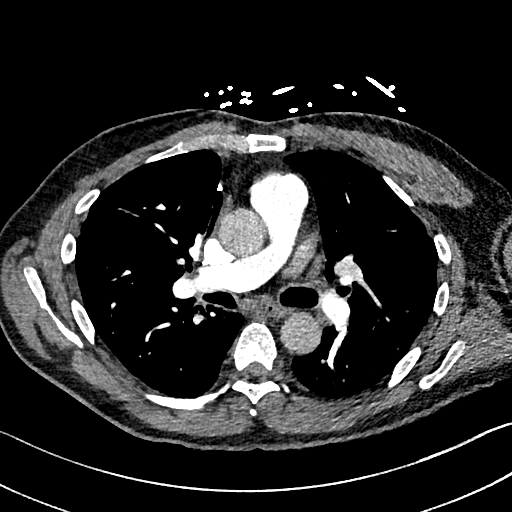
[im 178/292  lung]
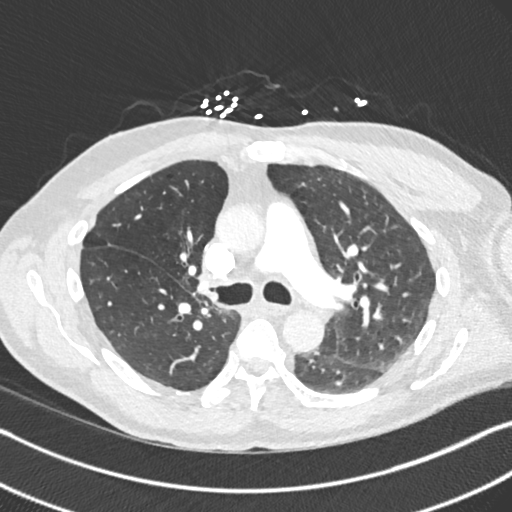
[im 195/292  mediastinal]
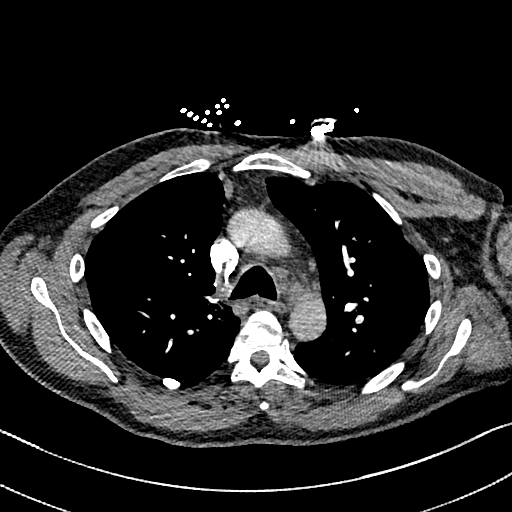
[im 211/292  lung]
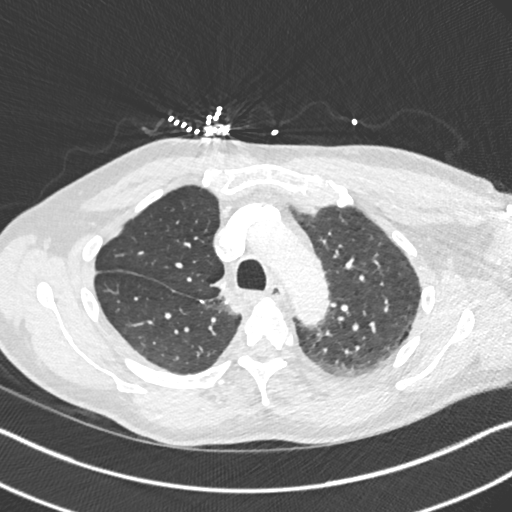
[im 227/292  mediastinal]
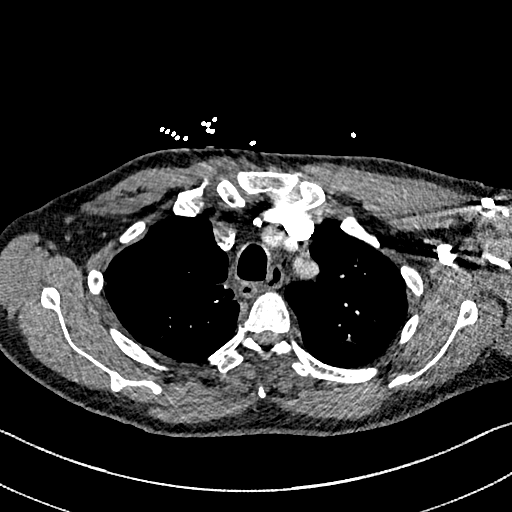
[im 243/292  lung]
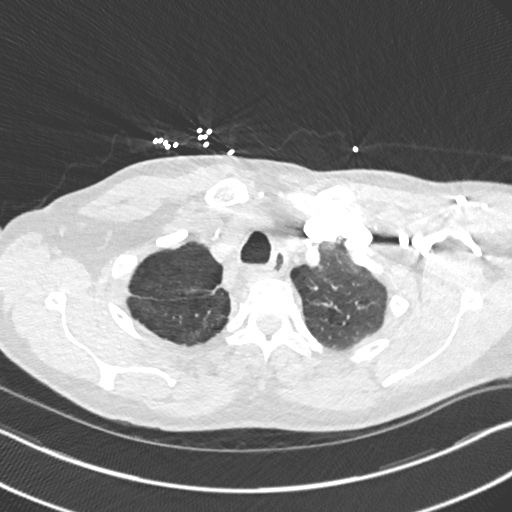
[im 259/292  mediastinal]
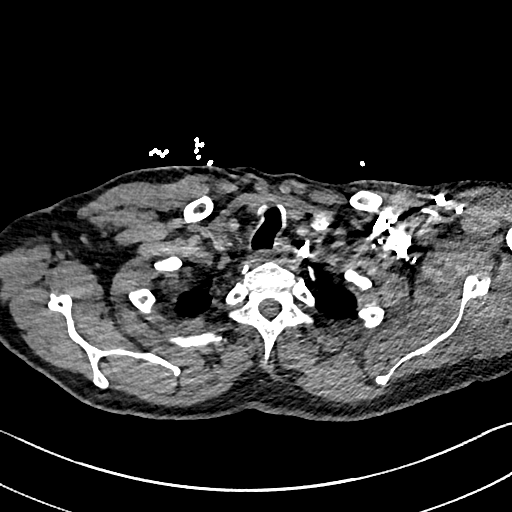
[im 275/292  lung]
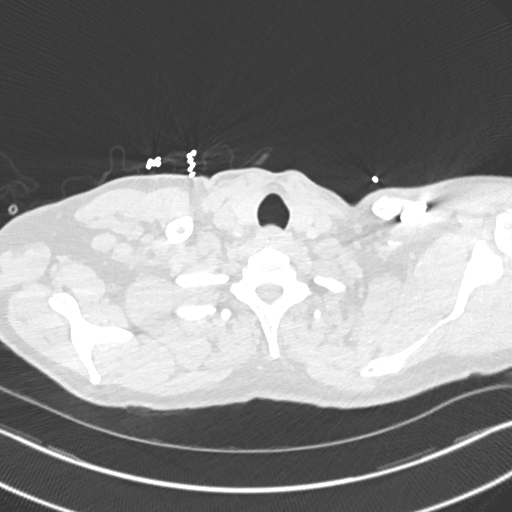

[Series 8: pe 2mm cor · coronal · 0.56mm/px · 1 of 148 slices shown]
[im 74/148  mediastinal]
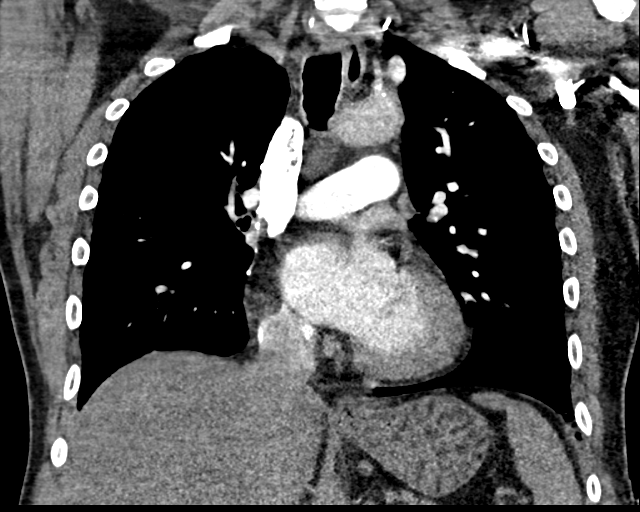

[18 of 36 positions shown; findings below may reference images not displayed]

FINDINGS: Cardiovascular: Satisfactory opacification of the pulmonary arteries
to the segmental level. No evidence of pulmonary embolism. Normal
heart size. Calcifications are noted along the course of the left
anterior descending coronary artery. No pericardial effusion.

Mediastinum/Nodes: Unremarkable CT appearance of the thyroid gland.
No suspicious mediastinal or hilar adenopathy. No soft tissue
mediastinal mass. The thoracic esophagus is unremarkable.

Lungs/Pleura: Surgical changes of prior right upper lobectomy again
noted. There is compensatory hyperinflation of the right middle and
lower lobes. Background of moderately severe centrilobular
emphysema. No suspicious pulmonary nodules or masses. No focal
airspace consolidation to suggest pneumonia. Minimal dependent
atelectasis is present. Mild diffuse bronchial wall thickening a
similar compared to prior.

Upper Abdomen: Visualized upper abdominal organs are unremarkable.

Musculoskeletal: No acute fracture or aggressive appearing lytic or
blastic osseous lesion. Stable post- operative changes with osseous
bridging between the right fifth and sixth ribs, likely at the site
of prior thoracotomy.

Review of the MIP images confirms the above findings.
IMPRESSION: 1. Negative for acute pulmonary embolism, pneumonia or other acute
cardiopulmonary process.
2. Stable surgical changes of prior right upper lobectomy without
evidence of new or recurrent malignancy.
3. Coronary artery calcifications. Please note that although the
presence of coronary artery calcium documents the presence of
coronary artery disease, the severity of this disease and any
potential stenosis cannot be assessed on this non-gated CT
examination. Assessment for potential risk factor modification,
dietary therapy or pharmacologic therapy may be warranted, if
clinically indicated.

## 2018-10-23 IMAGING — DX DG CHEST 2V
2 series · 2 of 2 positions shown · non-contrast
Comparison: CT chest 04/20/2017.  Chest 01/27/2017

CLINICAL DATA: Shortness of breath with sharp stabbing chest pain
radiating to the left arm.

EXAM:
CHEST  2 VIEW

[chest pa]
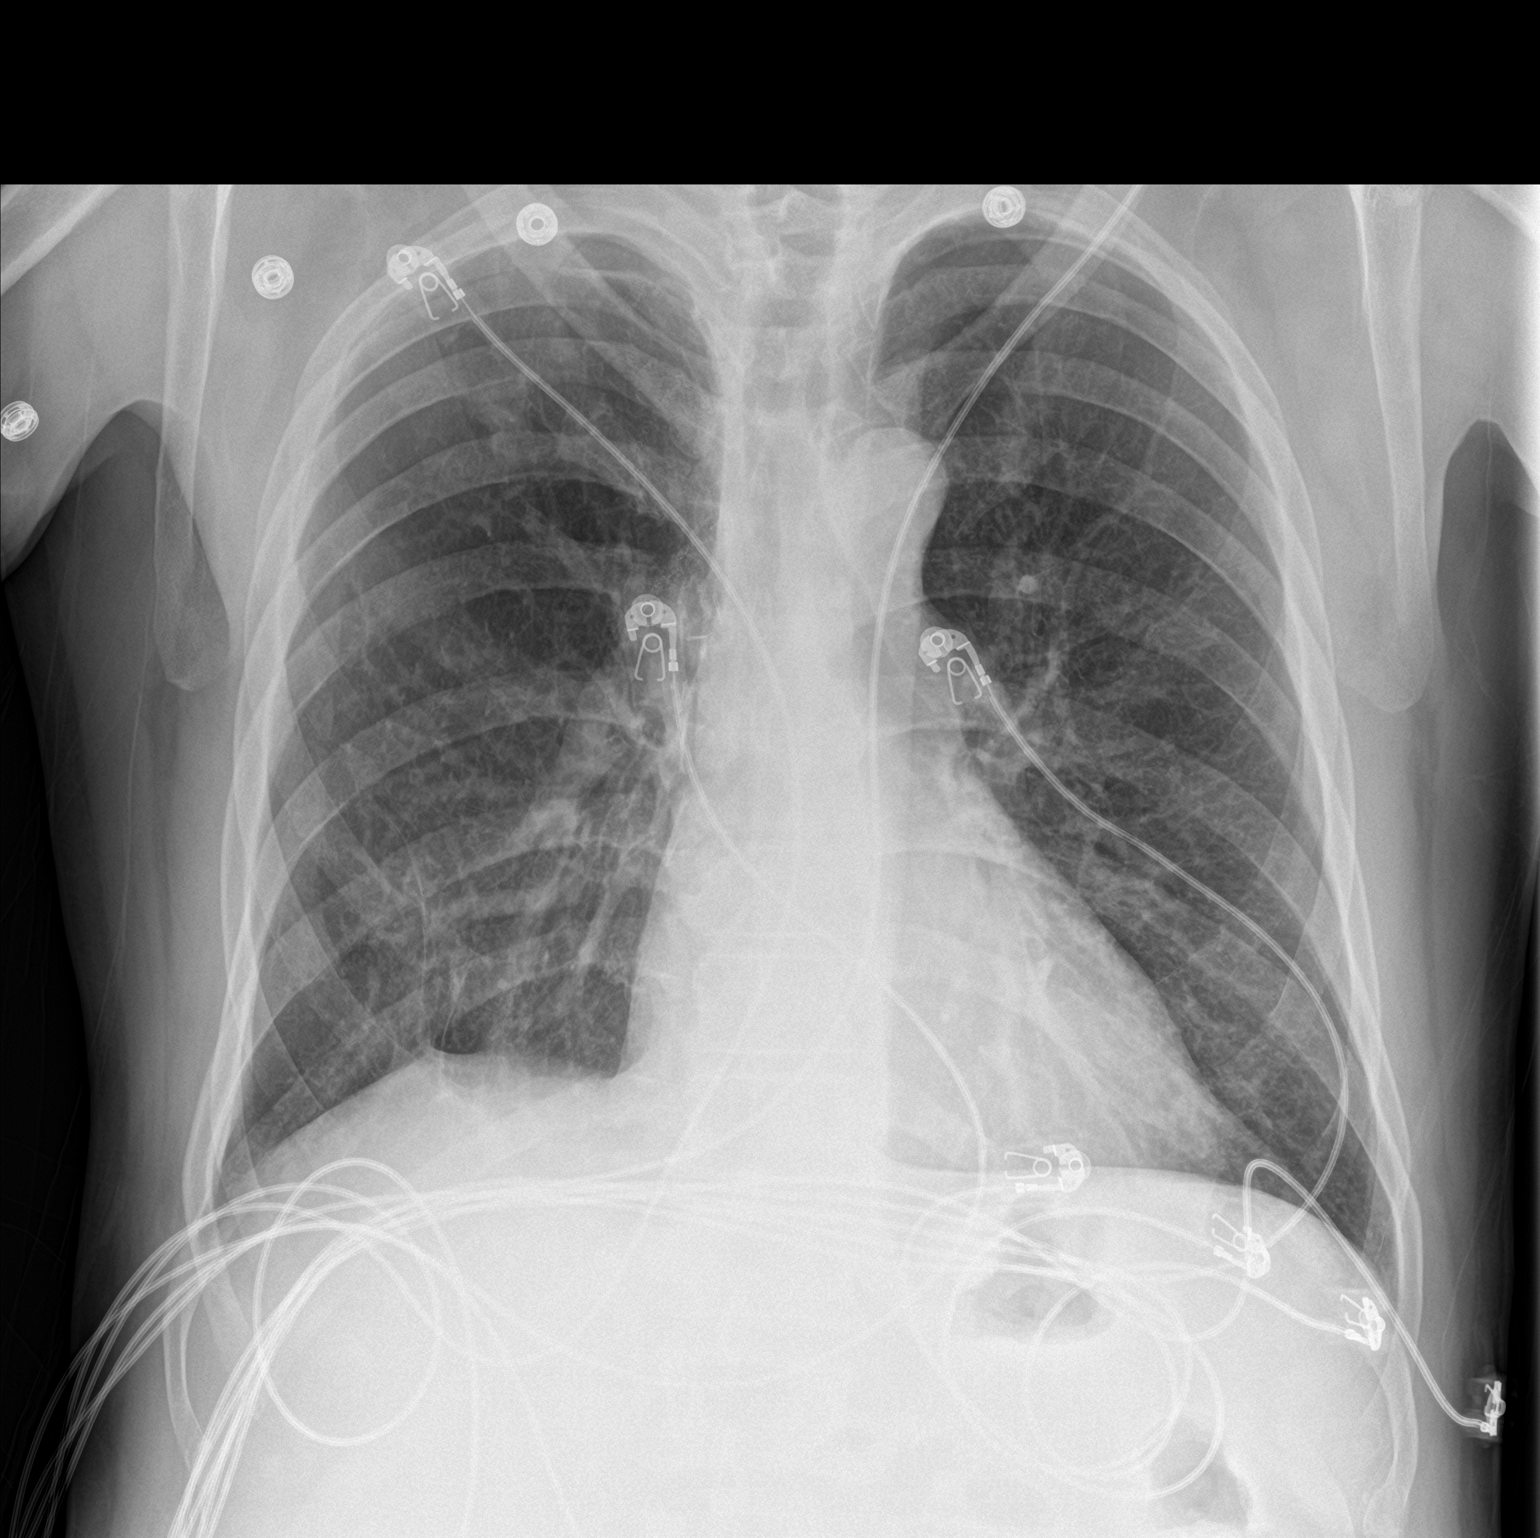

[chest lat]
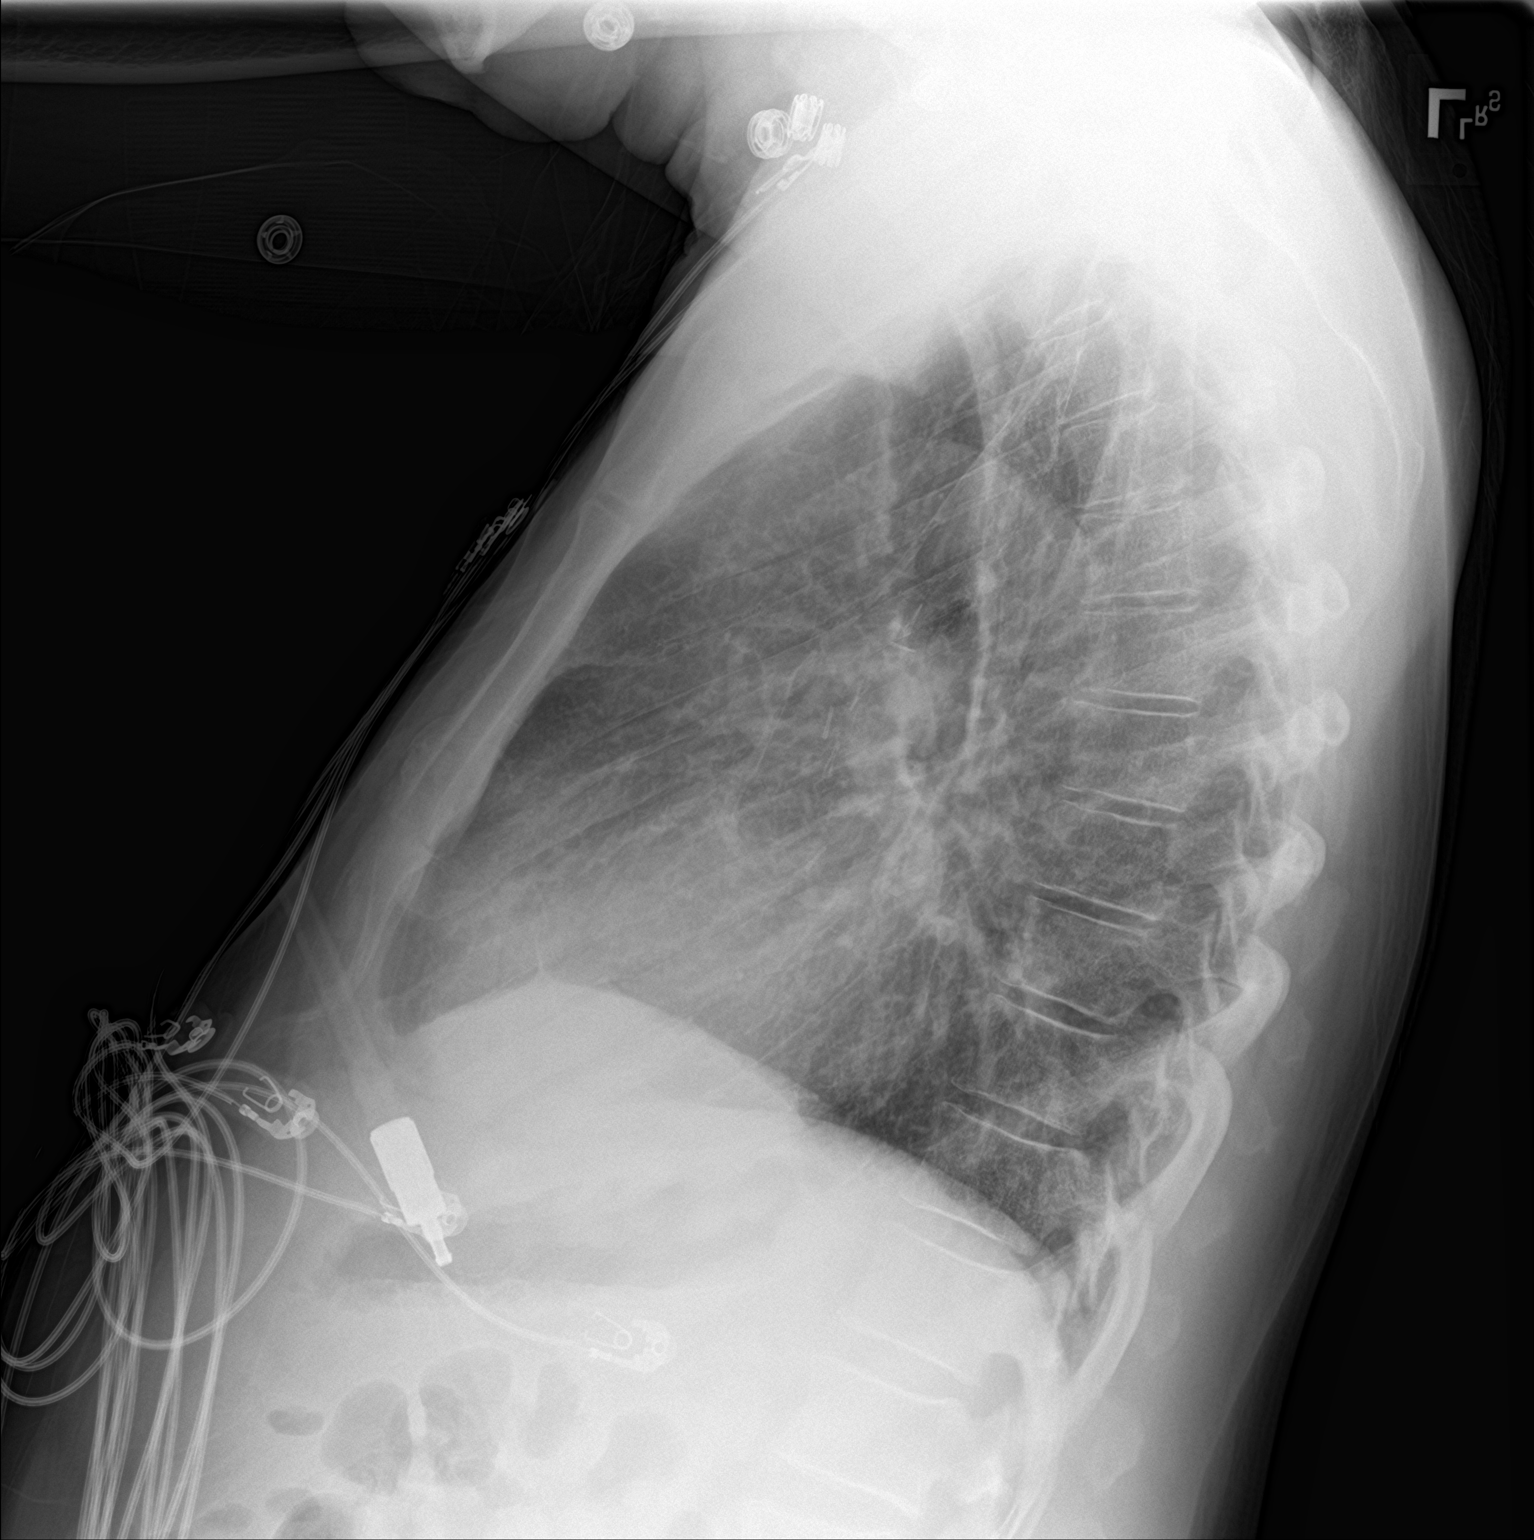

[2 of 2 positions shown; findings below may reference images not displayed]

FINDINGS: Normal heart size and pulmonary vascularity. Linear scarring in the
right lung base similar to previous study. Surgical clips in the
right axilla. No airspace disease or consolidation. No blunting of
costophrenic angles. No pneumothorax. Mediastinal contours appear
intact.
IMPRESSION: Postoperative changes in the right lung. No evidence of active
pulmonary disease.

## 2018-11-09 ENCOUNTER — Other Ambulatory Visit: Payer: Self-pay | Admitting: Internal Medicine

## 2018-11-09 DIAGNOSIS — Z79899 Other long term (current) drug therapy: Secondary | ICD-10-CM | POA: Diagnosis not present

## 2018-11-09 DIAGNOSIS — E119 Type 2 diabetes mellitus without complications: Secondary | ICD-10-CM | POA: Diagnosis not present

## 2018-11-09 DIAGNOSIS — J449 Chronic obstructive pulmonary disease, unspecified: Secondary | ICD-10-CM | POA: Diagnosis not present

## 2018-11-09 DIAGNOSIS — I110 Hypertensive heart disease with heart failure: Secondary | ICD-10-CM | POA: Diagnosis not present

## 2018-11-09 DIAGNOSIS — I5022 Chronic systolic (congestive) heart failure: Secondary | ICD-10-CM | POA: Diagnosis not present

## 2018-11-09 DIAGNOSIS — Z7251 High risk heterosexual behavior: Secondary | ICD-10-CM

## 2018-11-19 ENCOUNTER — Other Ambulatory Visit: Payer: Self-pay | Admitting: Internal Medicine

## 2018-11-19 DIAGNOSIS — J301 Allergic rhinitis due to pollen: Secondary | ICD-10-CM

## 2018-11-20 DIAGNOSIS — Z0001 Encounter for general adult medical examination with abnormal findings: Secondary | ICD-10-CM | POA: Diagnosis not present

## 2018-11-29 ENCOUNTER — Other Ambulatory Visit: Payer: Self-pay

## 2018-11-29 ENCOUNTER — Ambulatory Visit (HOSPITAL_COMMUNITY)
Admission: EM | Admit: 2018-11-29 | Discharge: 2018-11-29 | Disposition: A | Discharge status: Home / Self Care | Payer: Medicare Other | Attending: Family Medicine | Admitting: Family Medicine

## 2018-11-29 ENCOUNTER — Encounter (HOSPITAL_COMMUNITY): Payer: Self-pay

## 2018-11-29 DIAGNOSIS — M62830 Muscle spasm of back: Secondary | ICD-10-CM

## 2018-11-29 DIAGNOSIS — S39012A Strain of muscle, fascia and tendon of lower back, initial encounter: Secondary | ICD-10-CM

## 2018-11-29 DIAGNOSIS — M549 Dorsalgia, unspecified: Secondary | ICD-10-CM | POA: Diagnosis not present

## 2018-11-29 MED ORDER — CYCLOBENZAPRINE HCL 10 MG PO TABS: ORAL_TABLET | ORAL | 0 refills | Status: DC

## 2018-11-29 NOTE — ED Triage Notes (Signed)
Pt cc lower back pain and right shoulder pain. Pt states he as working and he was asked to lift a sofa and he did and this pain started

## 2018-11-29 NOTE — Discharge Instructions (Signed)
Recommend take Flexeril muscle relaxer- 1/2 to 1 whole tablet every 8 hours as needed for muscle pain/spasms. May continue Tylenol 1000mg  every 8 hours as needed. May apply warm compresses to area for comfort. Follow-up with your PCP in 4 to 5 days if not improving.
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
## 2018-11-30 ENCOUNTER — Inpatient Hospital Stay: Payer: Medicare Other | Attending: Internal Medicine

## 2018-11-30 ENCOUNTER — Other Ambulatory Visit: Payer: Self-pay | Admitting: Medical Oncology

## 2018-11-30 ENCOUNTER — Telehealth: Payer: Self-pay | Admitting: Medical Oncology

## 2018-11-30 DIAGNOSIS — F419 Anxiety disorder, unspecified: Secondary | ICD-10-CM | POA: Insufficient documentation

## 2018-11-30 DIAGNOSIS — I509 Heart failure, unspecified: Secondary | ICD-10-CM | POA: Insufficient documentation

## 2018-11-30 DIAGNOSIS — Z8711 Personal history of peptic ulcer disease: Secondary | ICD-10-CM | POA: Diagnosis not present

## 2018-11-30 DIAGNOSIS — M1990 Unspecified osteoarthritis, unspecified site: Secondary | ICD-10-CM | POA: Insufficient documentation

## 2018-11-30 DIAGNOSIS — Z85118 Personal history of other malignant neoplasm of bronchus and lung: Secondary | ICD-10-CM | POA: Diagnosis not present

## 2018-11-30 DIAGNOSIS — J449 Chronic obstructive pulmonary disease, unspecified: Secondary | ICD-10-CM | POA: Insufficient documentation

## 2018-11-30 DIAGNOSIS — I110 Hypertensive heart disease with heart failure: Secondary | ICD-10-CM | POA: Insufficient documentation

## 2018-11-30 DIAGNOSIS — E1151 Type 2 diabetes mellitus with diabetic peripheral angiopathy without gangrene: Secondary | ICD-10-CM | POA: Insufficient documentation

## 2018-11-30 DIAGNOSIS — I429 Cardiomyopathy, unspecified: Secondary | ICD-10-CM | POA: Insufficient documentation

## 2018-11-30 DIAGNOSIS — K760 Fatty (change of) liver, not elsewhere classified: Secondary | ICD-10-CM | POA: Insufficient documentation

## 2018-11-30 DIAGNOSIS — Z79899 Other long term (current) drug therapy: Secondary | ICD-10-CM | POA: Insufficient documentation

## 2018-11-30 DIAGNOSIS — K219 Gastro-esophageal reflux disease without esophagitis: Secondary | ICD-10-CM | POA: Insufficient documentation

## 2018-11-30 DIAGNOSIS — Z902 Acquired absence of lung [part of]: Secondary | ICD-10-CM | POA: Insufficient documentation

## 2018-11-30 DIAGNOSIS — E785 Hyperlipidemia, unspecified: Secondary | ICD-10-CM | POA: Diagnosis not present

## 2018-11-30 DIAGNOSIS — F209 Schizophrenia, unspecified: Secondary | ICD-10-CM | POA: Diagnosis not present

## 2018-11-30 DIAGNOSIS — Z955 Presence of coronary angioplasty implant and graft: Secondary | ICD-10-CM | POA: Insufficient documentation

## 2018-11-30 DIAGNOSIS — K7689 Other specified diseases of liver: Secondary | ICD-10-CM | POA: Insufficient documentation

## 2018-11-30 DIAGNOSIS — C3490 Malignant neoplasm of unspecified part of unspecified bronchus or lung: Secondary | ICD-10-CM

## 2018-11-30 LAB — CBC WITH DIFFERENTIAL (CANCER CENTER ONLY)
Abs Immature Granulocytes: 0.04 10*3/uL (ref 0.00–0.07)
BASOS ABS: 0.1 10*3/uL (ref 0.0–0.1)
Basophils Relative: 1 %
Eosinophils Absolute: 0.1 10*3/uL (ref 0.0–0.5)
Eosinophils Relative: 1 %
HCT: 50.6 % (ref 39.0–52.0)
Hemoglobin: 17.2 g/dL — ABNORMAL HIGH (ref 13.0–17.0)
IMMATURE GRANULOCYTES: 1 %
Lymphocytes Relative: 33 %
Lymphs Abs: 2 10*3/uL (ref 0.7–4.0)
MCH: 31.2 pg (ref 26.0–34.0)
MCHC: 34 g/dL (ref 30.0–36.0)
MCV: 91.8 fL (ref 80.0–100.0)
Monocytes Absolute: 0.6 10*3/uL (ref 0.1–1.0)
Monocytes Relative: 10 %
NRBC: 0 % (ref 0.0–0.2)
Neutro Abs: 3.4 10*3/uL (ref 1.7–7.7)
Neutrophils Relative %: 54 %
Platelet Count: 104 10*3/uL — ABNORMAL LOW (ref 150–400)
RBC: 5.51 MIL/uL (ref 4.22–5.81)
RDW: 15.3 % (ref 11.5–15.5)
WBC Count: 6.1 10*3/uL (ref 4.0–10.5)

## 2018-11-30 LAB — COMPREHENSIVE METABOLIC PANEL
ALT: 243 U/L — ABNORMAL HIGH (ref 0–44)
AST: 274 U/L — AB (ref 15–41)
Albumin: 4.1 g/dL (ref 3.5–5.0)
Alkaline Phosphatase: 83 U/L (ref 38–126)
Anion gap: 11 (ref 5–15)
BUN: 7 mg/dL (ref 6–20)
CO2: 21 mmol/L — AB (ref 22–32)
Calcium: 9 mg/dL (ref 8.9–10.3)
Chloride: 106 mmol/L (ref 98–111)
Creatinine, Ser: 0.85 mg/dL (ref 0.61–1.24)
GFR calc Af Amer: >60 mL/min (ref >60)
GFR calc non Af Amer: >60 mL/min (ref >60)
Glucose, Bld: 108 mg/dL — ABNORMAL HIGH (ref 70–99)
Potassium: 4.2 mmol/L (ref 3.5–5.1)
Sodium: 138 mmol/L (ref 135–145)
Total Bilirubin: 0.9 mg/dL (ref 0.3–1.2)
Total Protein: 7.5 g/dL (ref 6.5–8.1)

## 2018-11-30 NOTE — Progress Notes (Unsigned)
Critical AST of 274 was given to Dr. Julien Nordmann 11/30/2018 at 1108. Diamantina Monks [place]

## 2018-11-30 NOTE — Telephone Encounter (Signed)
Called to pt  Pt stated he drinks 3 beers a day and takes medication that can affect his liver. Pt advised to quit drinking beer and other alcohol. He is seeing Dr Marvel Plan on Tuesday at [place] location. Labs faxed to Dr Bradd Burner.

## 2018-11-30 NOTE — Telephone Encounter (Signed)
Curt Bears, MD  Ardeen Garland, RN        Liver enzymes are elevated. Please call the patient and advise him to quit drinking alcohol if he does and also to check with his primary care physician if he does not drink alcohol for the elevated liver enzymes.

## 2018-12-03 ENCOUNTER — Ambulatory Visit (HOSPITAL_COMMUNITY)
Admission: RE | Admit: 2018-12-03 | Discharge: 2018-12-03 | Disposition: A | Discharge status: Home / Self Care | Payer: Medicare Other | Source: Ambulatory Visit | Attending: Internal Medicine | Admitting: Internal Medicine

## 2018-12-03 DIAGNOSIS — C3490 Malignant neoplasm of unspecified part of unspecified bronchus or lung: Secondary | ICD-10-CM | POA: Diagnosis present

## 2018-12-03 MED ORDER — IOHEXOL 300 MG/ML  SOLN
75.0000 mL | Freq: Once | INTRAMUSCULAR | Status: AC | PRN
  Administered 2018-12-03: 75 mL via INTRAVENOUS

## 2018-12-03 MED ORDER — SODIUM CHLORIDE (PF) 0.9 % IJ SOLN
INTRAMUSCULAR | Status: AC
  Filled 2018-12-03: qty 50

## 2018-12-05 ENCOUNTER — Other Ambulatory Visit: Payer: Self-pay

## 2018-12-05 ENCOUNTER — Inpatient Hospital Stay (HOSPITAL_BASED_OUTPATIENT_CLINIC_OR_DEPARTMENT_OTHER): Payer: Medicare Other | Admitting: Internal Medicine

## 2018-12-05 ENCOUNTER — Encounter: Payer: Self-pay | Admitting: Medical Oncology

## 2018-12-05 ENCOUNTER — Encounter: Payer: Self-pay | Admitting: Internal Medicine

## 2018-12-05 ENCOUNTER — Telehealth: Payer: Self-pay | Admitting: Internal Medicine

## 2018-12-05 VITALS — BP 136/81 | HR 83 | Temp 98.4°F | Resp 18 | Ht 75.0 in | Wt 215.9 lb

## 2018-12-05 DIAGNOSIS — Z85118 Personal history of other malignant neoplasm of bronchus and lung: Secondary | ICD-10-CM

## 2018-12-05 DIAGNOSIS — C3490 Malignant neoplasm of unspecified part of unspecified bronchus or lung: Secondary | ICD-10-CM

## 2018-12-05 DIAGNOSIS — C3491 Malignant neoplasm of unspecified part of right bronchus or lung: Secondary | ICD-10-CM

## 2018-12-05 DIAGNOSIS — D696 Thrombocytopenia, unspecified: Secondary | ICD-10-CM

## 2018-12-05 DIAGNOSIS — Z902 Acquired absence of lung [part of]: Secondary | ICD-10-CM | POA: Diagnosis not present

## 2018-12-05 DIAGNOSIS — I10 Essential (primary) hypertension: Secondary | ICD-10-CM
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
## 2018-12-05 NOTE — Telephone Encounter (Signed)
Scheduled appt per 3/11 los - sent reminder letter in the mail with appt date and time

## 2018-12-12 ENCOUNTER — Other Ambulatory Visit: Payer: Self-pay

## 2018-12-12 ENCOUNTER — Ambulatory Visit (INDEPENDENT_AMBULATORY_CARE_PROVIDER_SITE_OTHER): Payer: Medicare Other | Admitting: Podiatry

## 2018-12-12 ENCOUNTER — Encounter: Payer: Self-pay | Admitting: Podiatry

## 2018-12-12 DIAGNOSIS — M79674 Pain in right toe(s): Secondary | ICD-10-CM

## 2018-12-12 DIAGNOSIS — B351 Tinea unguium: Secondary | ICD-10-CM

## 2018-12-12 DIAGNOSIS — M79675 Pain in left toe(s): Secondary | ICD-10-CM

## 2018-12-12 DIAGNOSIS — E119 Type 2 diabetes mellitus without complications: Secondary | ICD-10-CM

## 2018-12-12 DIAGNOSIS — L84 Corns and callosities: Secondary | ICD-10-CM

## 2018-12-12 NOTE — Progress Notes (Signed)
This patient presents to the office with chief complaint of long thick nails and diabetic feet.  This patient  says there  is   pain and discomfort due to callus under big toe joint left foot..  This patient says there are long thick painful nails.  These nails are painful walking and wearing shoes.  Patient has no history of infection or drainage from both feet.  Patient is unable to  self treat his own nails . This patient presents  to the office today for treatment of the  long nails and his painful callus.   General Appearance  Alert, conversant and in no acute stress.  Vascular  Dorsalis pedis and posterior tibial  pulses are palpable  bilaterally.  Capillary return is within normal limits  bilaterally. Temperature is within normal limits  bilaterally.  Neurologic  Senn-Weinstein monofilament wire test within normal limits  bilaterally. Muscle power within normal limits bilaterally.  Nails Thick disfigured discolored nails with subungual debris  from hallux to fifth toes bilaterally. No evidence of bacterial infection or drainage bilaterally.  Orthopedic  No limitations of motion of motion feet .  No crepitus or effusions noted.  No bony pathology or digital deformities noted.  Skin  normotropic skin  noted bilaterally.  No signs of infections or ulcers noted.   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 Pinch callus right hallux.  Onychomycosis  Diabetes with no foot complications  Callus B/L  IE  Debride nails x 10.   Debridement of callus  B/L.  RTC 3 months   Gardiner Barefoot DPM

## 2018-12-17 ENCOUNTER — Other Ambulatory Visit: Payer: Self-pay | Admitting: Internal Medicine

## 2018-12-17 DIAGNOSIS — J301 Allergic rhinitis due to pollen: Secondary | ICD-10-CM

## 2019-01-10 ENCOUNTER — Other Ambulatory Visit: Payer: Self-pay | Admitting: Internal Medicine

## 2019-01-10 DIAGNOSIS — J449 Chronic obstructive pulmonary disease, unspecified: Secondary | ICD-10-CM

## 2019-01-17 ENCOUNTER — Encounter (HOSPITAL_COMMUNITY): Payer: Self-pay

## 2019-01-17 ENCOUNTER — Emergency Department (HOSPITAL_COMMUNITY): Payer: Medicare Other

## 2019-01-17 ENCOUNTER — Other Ambulatory Visit: Payer: Self-pay

## 2019-01-17 ENCOUNTER — Emergency Department (HOSPITAL_COMMUNITY)
Admission: EM | Admit: 2019-01-17 | Discharge: 2019-01-17 | Disposition: A | Discharge status: Home / Self Care | Payer: Medicare Other | Attending: Emergency Medicine | Admitting: Emergency Medicine

## 2019-01-17 DIAGNOSIS — Z79899 Other long term (current) drug therapy: Secondary | ICD-10-CM | POA: Insufficient documentation

## 2019-01-17 DIAGNOSIS — E119 Type 2 diabetes mellitus without complications: Secondary | ICD-10-CM | POA: Insufficient documentation

## 2019-01-17 DIAGNOSIS — I110 Hypertensive heart disease with heart failure: Secondary | ICD-10-CM | POA: Diagnosis not present

## 2019-01-17 DIAGNOSIS — Z85118 Personal history of other malignant neoplasm of bronchus and lung: Secondary | ICD-10-CM | POA: Diagnosis not present

## 2019-01-17 DIAGNOSIS — I509 Heart failure, unspecified: Secondary | ICD-10-CM | POA: Diagnosis not present

## 2019-01-17 DIAGNOSIS — F17210 Nicotine dependence, cigarettes, uncomplicated: Secondary | ICD-10-CM | POA: Insufficient documentation

## 2019-01-17 DIAGNOSIS — J449 Chronic obstructive pulmonary disease, unspecified: Secondary | ICD-10-CM | POA: Insufficient documentation

## 2019-01-17 DIAGNOSIS — R1032 Left lower quadrant pain: Secondary | ICD-10-CM | POA: Diagnosis not present

## 2019-01-17 LAB — URINALYSIS, ROUTINE W REFLEX MICROSCOPIC
Bilirubin Urine: NEGATIVE
Glucose, UA: NEGATIVE mg/dL
Hgb urine dipstick: NEGATIVE
Ketones, ur: NEGATIVE mg/dL
Leukocytes,Ua: NEGATIVE
Nitrite: NEGATIVE
Protein, ur: NEGATIVE mg/dL
Specific Gravity, Urine: 1.001 — ABNORMAL LOW (ref 1.005–1.030)
pH: 5 (ref 5.0–8.0)

## 2019-01-17 LAB — COMPREHENSIVE METABOLIC PANEL
ALT: 149 U/L — ABNORMAL HIGH (ref 0–44)
AST: 162 U/L — ABNORMAL HIGH (ref 15–41)
Albumin: 3.8 g/dL (ref 3.5–5.0)
Alkaline Phosphatase: 85 U/L (ref 38–126)
Anion gap: 10 (ref 5–15)
BUN: <5 mg/dL — ABNORMAL LOW (ref 6–20)
CO2: 20 mmol/L — ABNORMAL LOW (ref 22–32)
Calcium: 8.7 mg/dL — ABNORMAL LOW (ref 8.9–10.3)
Chloride: 109 mmol/L (ref 98–111)
Creatinine, Ser: 0.71 mg/dL (ref 0.61–1.24)
GFR calc Af Amer: >60 mL/min (ref >60)
GFR calc non Af Amer: >60 mL/min (ref >60)
Glucose, Bld: 95 mg/dL (ref 70–99)
Potassium: 3.8 mmol/L (ref 3.5–5.1)
Sodium: 139 mmol/L (ref 135–145)
Total Bilirubin: 0.6 mg/dL (ref 0.3–1.2)
Total Protein: 7.2 g/dL (ref 6.5–8.1)

## 2019-01-17 LAB — CBC WITH DIFFERENTIAL/PLATELET
Abs Immature Granulocytes: 0.05 10*3/uL (ref 0.00–0.07)
Basophils Absolute: 0.1 10*3/uL (ref 0.0–0.1)
Basophils Relative: 1 %
Eosinophils Absolute: 0.1 10*3/uL (ref 0.0–0.5)
Eosinophils Relative: 2 %
HCT: 51.3 % (ref 39.0–52.0)
Hemoglobin: 17.3 g/dL — ABNORMAL HIGH (ref 13.0–17.0)
Immature Granulocytes: 1 %
Lymphocytes Relative: 44 %
Lymphs Abs: 3.5 10*3/uL (ref 0.7–4.0)
MCH: 31.7 pg (ref 26.0–34.0)
MCHC: 33.7 g/dL (ref 30.0–36.0)
MCV: 94 fL (ref 80.0–100.0)
Monocytes Absolute: 0.6 10*3/uL (ref 0.1–1.0)
Monocytes Relative: 8 %
Neutro Abs: 3.4 10*3/uL (ref 1.7–7.7)
Neutrophils Relative %: 44 %
Platelets: 99 10*3/uL — ABNORMAL LOW (ref 150–400)
RBC: 5.46 MIL/uL (ref 4.22–5.81)
RDW: 15.4 % (ref 11.5–15.5)
WBC: 7.8 10*3/uL (ref 4.0–10.5)
nRBC: 0 % (ref 0.0–0.2)

## 2019-01-17 LAB — LIPASE, BLOOD: Lipase: 31 U/L (ref 11–51)

## 2019-01-17 MED ORDER — SODIUM CHLORIDE (PF) 0.9 % IJ SOLN
INTRAMUSCULAR | Status: AC
  Filled 2019-01-17: qty 50

## 2019-01-17 MED ORDER — ONDANSETRON HCL 4 MG/2ML IJ SOLN
4.0000 mg | Freq: Once | INTRAMUSCULAR | Status: AC
  Administered 2019-01-17: 4 mg via INTRAVENOUS
  Filled 2019-01-17: qty 2

## 2019-01-17 MED ORDER — IOHEXOL 300 MG/ML  SOLN
100.0000 mL | Freq: Once | INTRAMUSCULAR | Status: AC | PRN
  Administered 2019-01-17: 100 mL via INTRAVENOUS

## 2019-01-17 MED ORDER — SODIUM CHLORIDE 0.9 % IV SOLN
INTRAVENOUS | Status: DC
  Administered 2019-01-17: 20:00:00 via INTRAVENOUS

## 2019-01-17 MED ORDER — SODIUM CHLORIDE 0.9 % IV BOLUS
500.0000 mL | Freq: Once | INTRAVENOUS | Status: AC
  Administered 2019-01-17: 500 mL via INTRAVENOUS

## 2019-01-17 NOTE — Discharge Instructions (Addendum)
Continue your current medications, follow-up with your primary care doctor as planned

## 2019-01-17 NOTE — ED Notes (Signed)
Pt has left side abdominal pain 7/10 pain. Pt abdominal appears distended and is guarded. Pt reports that he has been having some diarrhea.

## 2019-01-17 NOTE — ED Triage Notes (Signed)
Pt BIBA from home c/o left sided abd pain x 3 days. Pt describes diffuse tenderness. Pt may have been dx with "liver disease". Pt has hx of ETOH abuse, admits to use today.

## 2019-01-17 NOTE — ED Notes (Signed)
Bed: WA21 Expected date:  Expected time:  Means of arrival:  Comments: EMS abd pain
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
## 2019-01-23 IMAGING — DX DG CHEST 2V
2 series · 2 of 2 positions shown · non-contrast
Comparison: Chest radiographs and chest CTA dated 08/03/2017.

CLINICAL DATA: Cough for the past 4 days. Shortness of breath.
Smoker.

EXAM:
CHEST  2 VIEW

[chest pa]
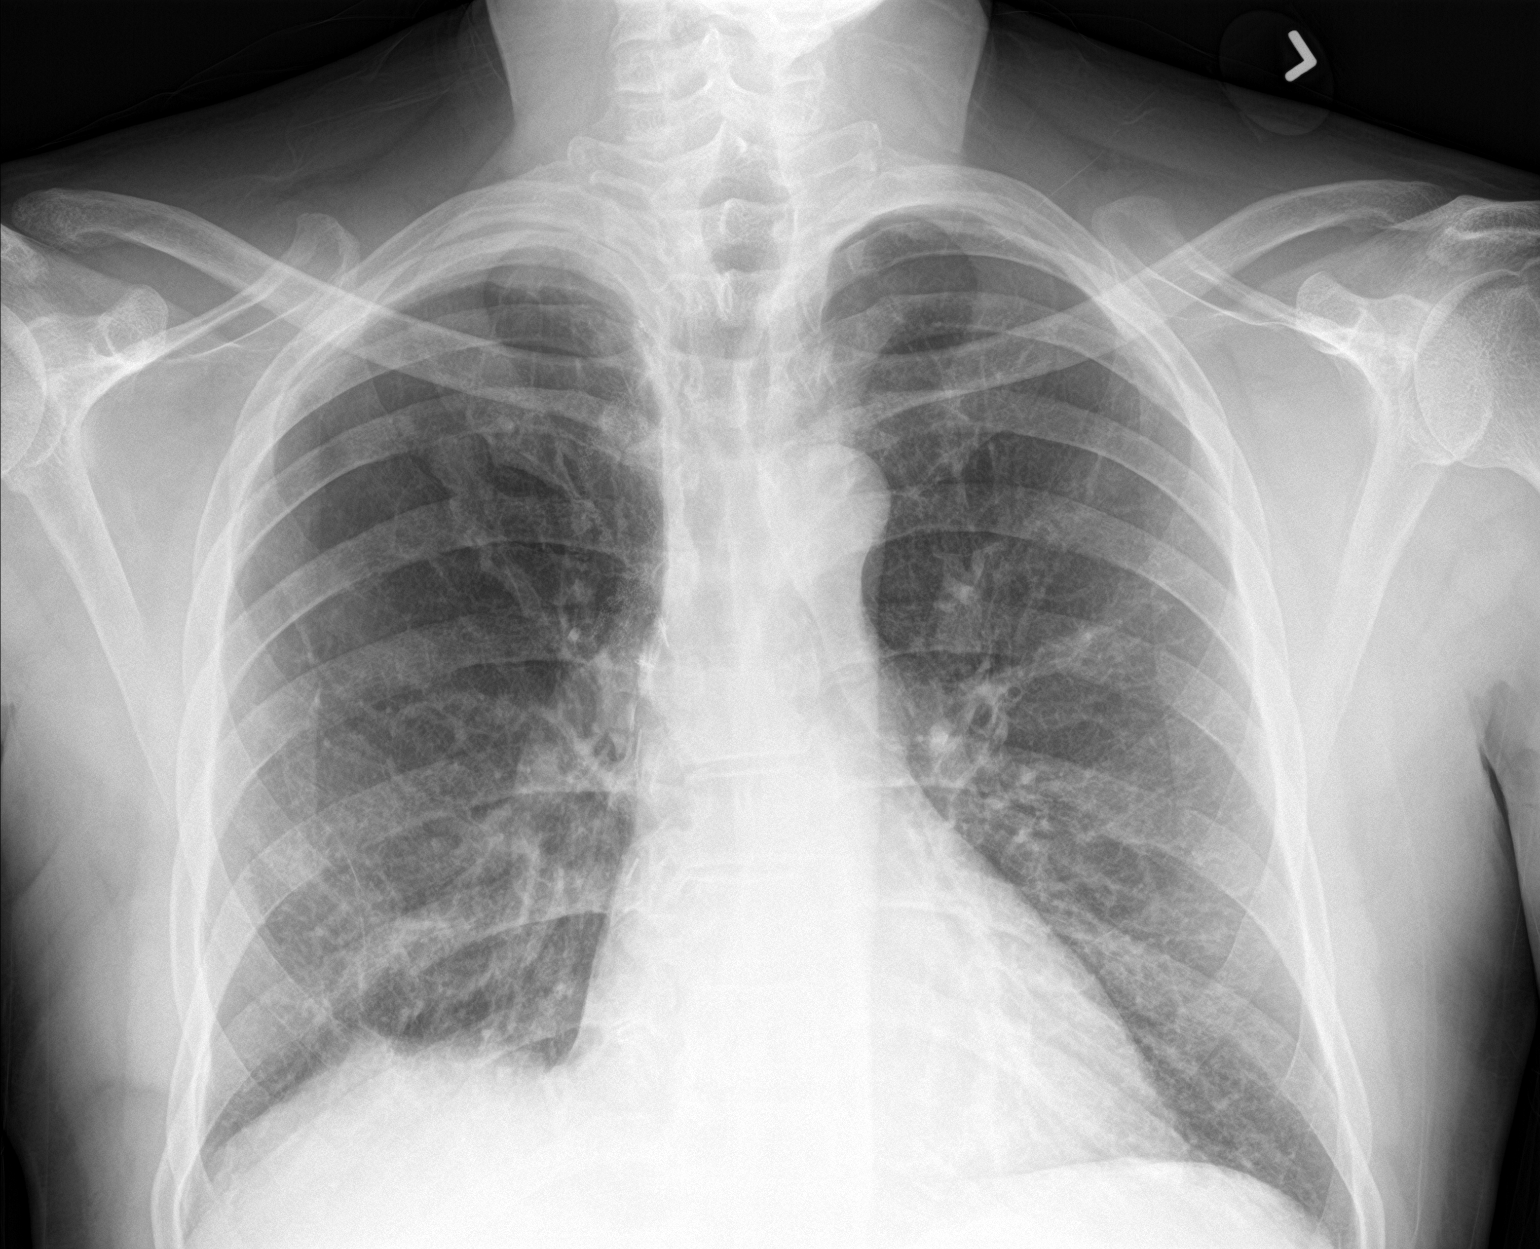

[chest lat]
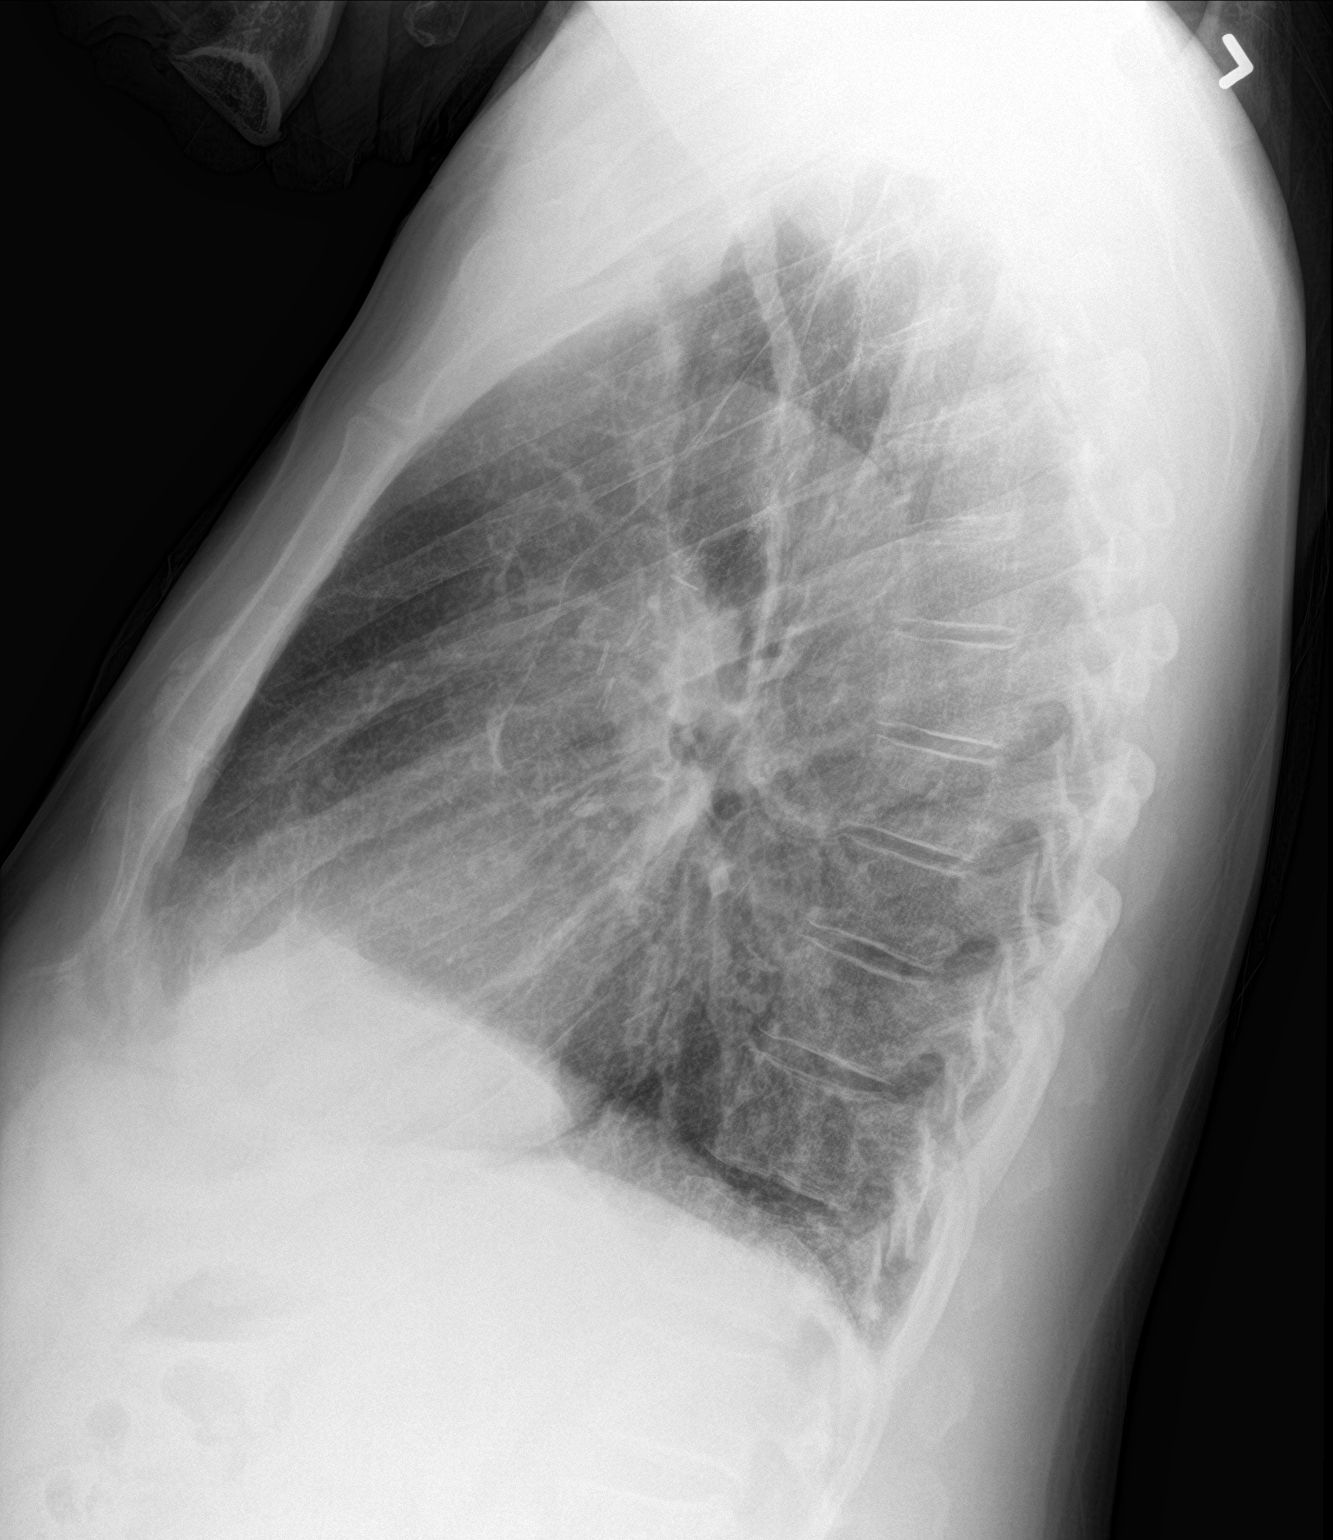

[2 of 2 positions shown; findings below may reference images not displayed]

FINDINGS: Normal sized heart. Mild diffuse peribronchial thickening and
accentuation of the interstitial markings with mild progression.
Stable pleural and parenchymal scarring at the right lung base.
Unremarkable bones.
IMPRESSION: Mild acute bronchitic changes superimposed on mild changes of COPD
and chronic bronchitis.

## 2019-02-05 ENCOUNTER — Other Ambulatory Visit: Payer: Self-pay | Admitting: Internal Medicine

## 2019-02-05 DIAGNOSIS — Z7251 High risk heterosexual behavior: Secondary | ICD-10-CM

## 2019-02-05 DIAGNOSIS — J301 Allergic rhinitis due to pollen: Secondary | ICD-10-CM

## 2019-02-13 ENCOUNTER — Other Ambulatory Visit: Payer: Self-pay | Admitting: Internal Medicine

## 2019-02-16 IMAGING — CT CT CHEST W/ CM
2 of 4 series · 15 of 36 positions shown, 18 images · IV contrast (ISOVUE 300)
Comparison: 04/20/2017

CLINICAL DATA: Non-small cell lung cancer

EXAM:
CT CHEST WITH CONTRAST
TECHNIQUE: Multidetector CT imaging of the chest was performed during
intravenous contrast administration.
CONTRAST:  75 mL Isovue

[Series 2: axial st · axial · 0.81mm/px · z∈[-202,+64]mm · 12 of 157 slices shown, 15 images]
[im 12/157  mediastinal]
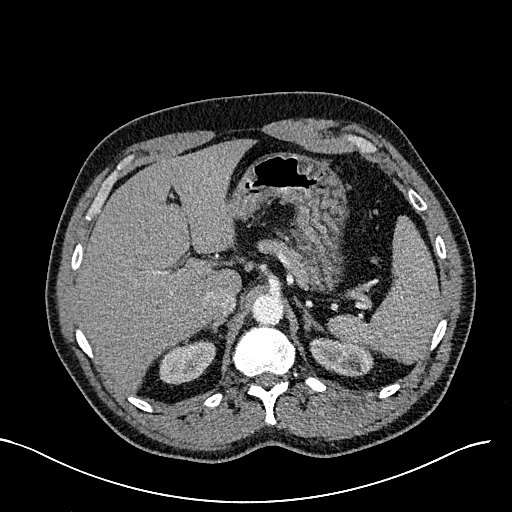
[im 12/157  lung]
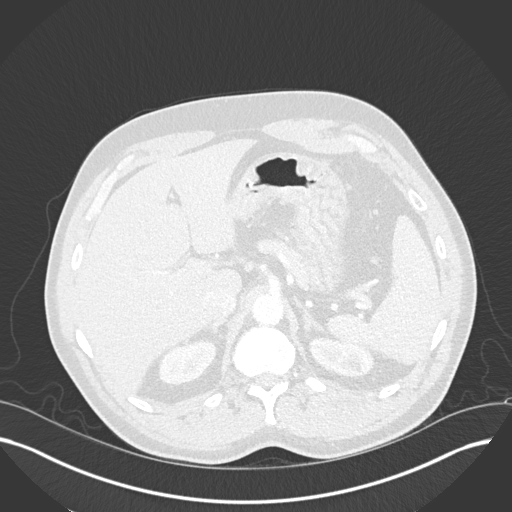
[im 23/157  lung]
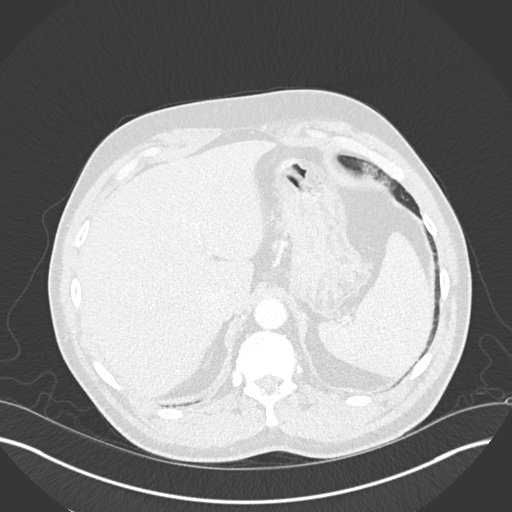
[im 34/157  lung]
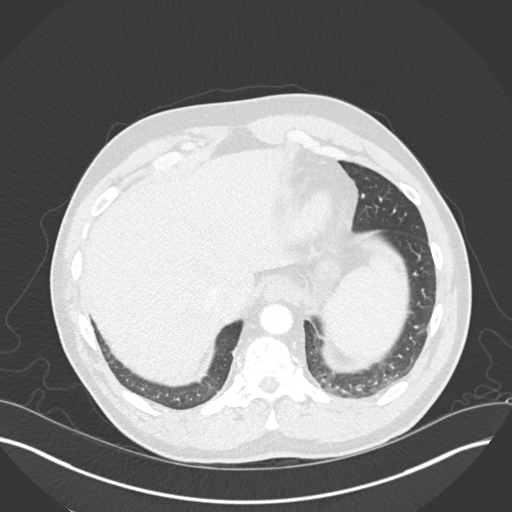
[im 45/157  lung]
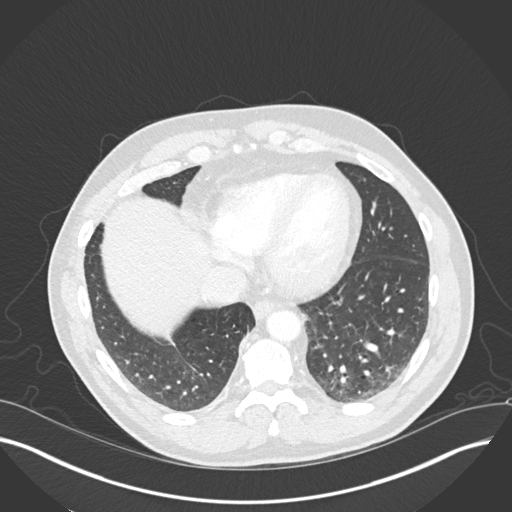
[im 56/157  mediastinal]
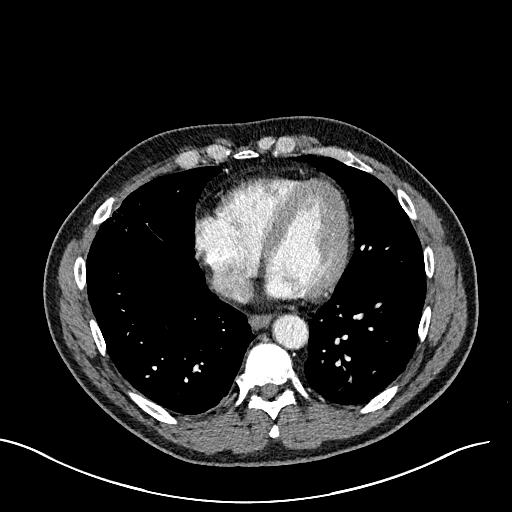
[im 56/157  lung]
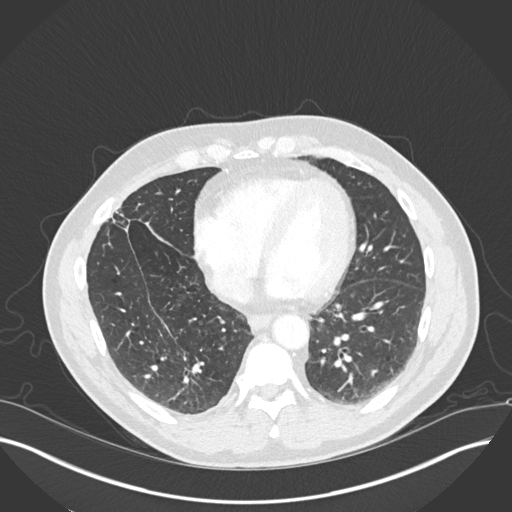
[im 67/157  lung]
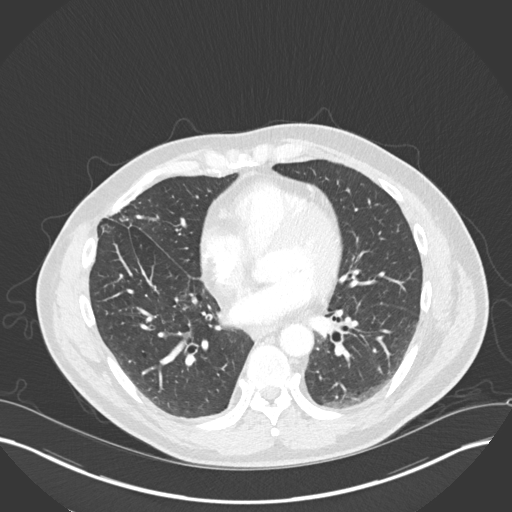
[im 90/157  lung]
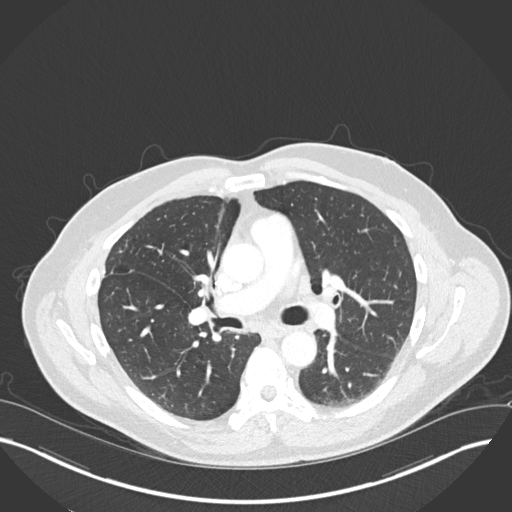
[im 101/157  lung]
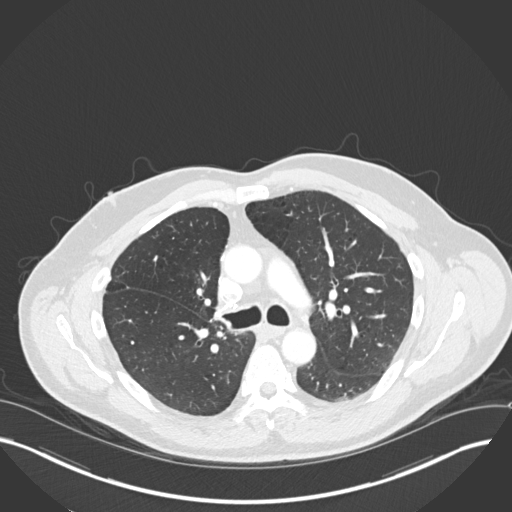
[im 112/157  mediastinal]
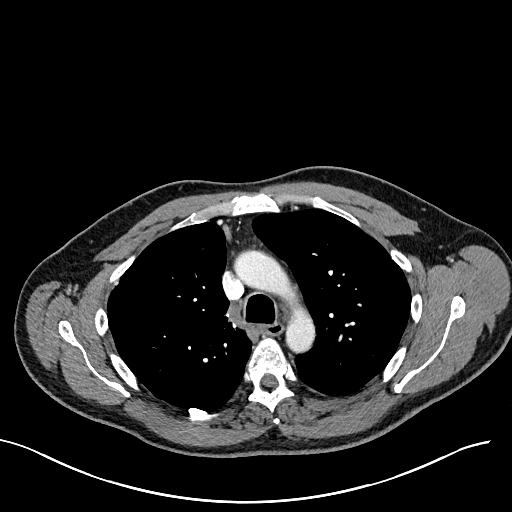
[im 112/157  lung]
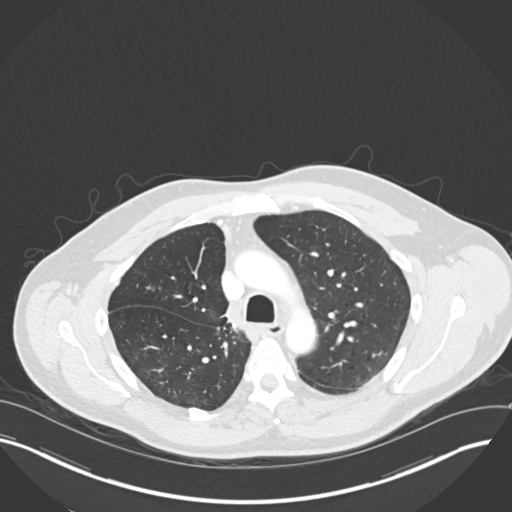
[im 123/157  lung]
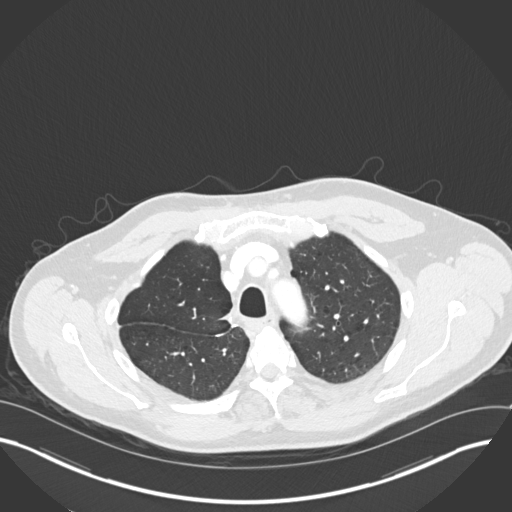
[im 134/157  lung]
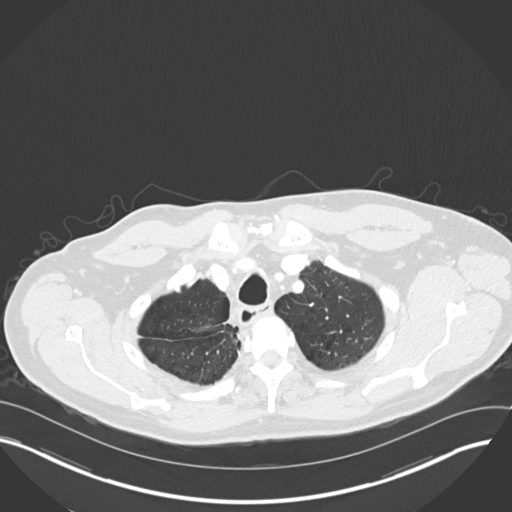
[im 145/157  lung]
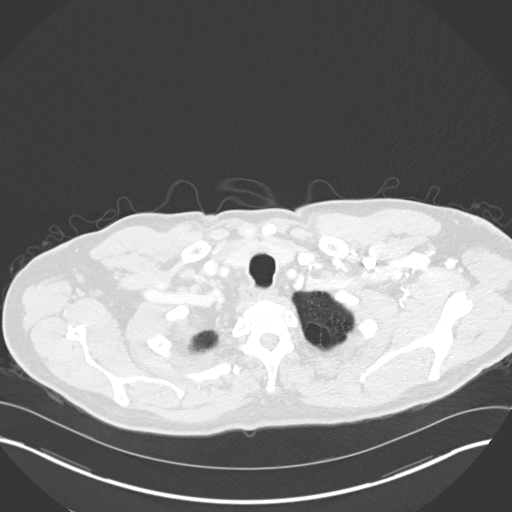

[Series 5: coronal · coronal · 0.69mm/px · 3 of 135 slices shown]
[im 27/135  lung]
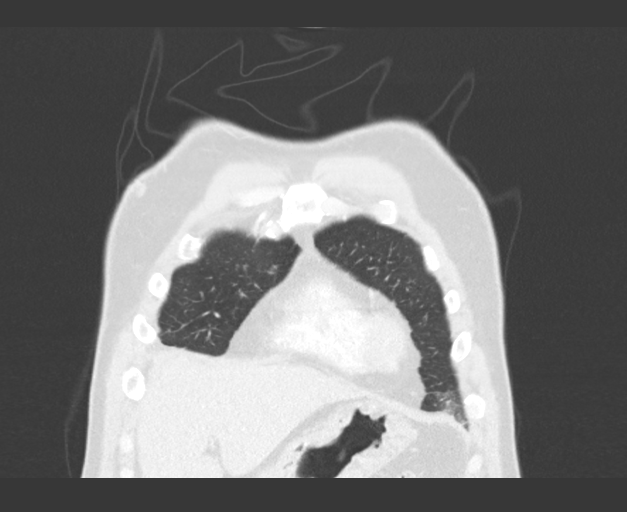
[im 54/135  lung]
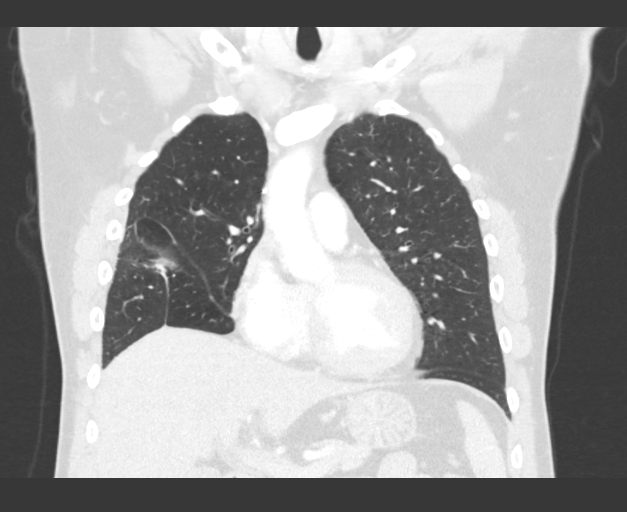
[im 81/135  lung]
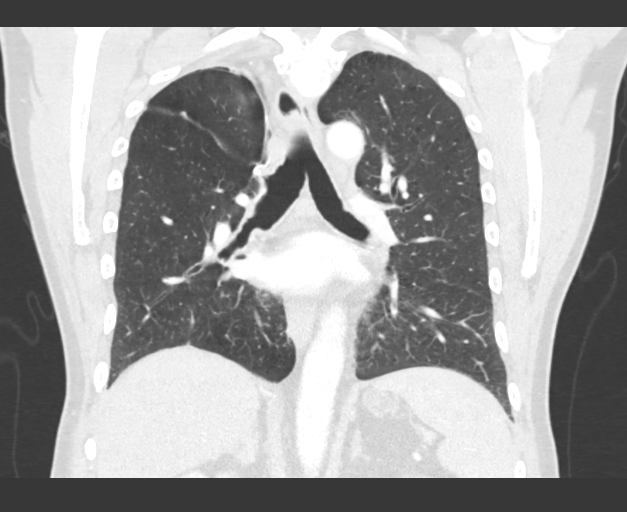

[15 of 36 positions shown; findings below may reference images not displayed]

FINDINGS: CT CHEST FINDINGS

Cardiovascular: No significant vascular findings. Normal heart size.
No pericardial effusion.

Mediastinum/Nodes: No axillary or supraclavicular adenopathy. No
mediastinal or hilar adenopathy. No pericardial effusion. Esophagus
normal.

Lungs/Pleura: postsurgical change in the RIGHT upper lobe. Linear
scarring along the resection margin. No suspicious nodularity.

Upper abdomen: Limited view of the liver, kidneys, pancreas are
unremarkable. Normal adrenal glands.

Musculoskeletal: No aggressive osseous lesion.
IMPRESSION: 1. Postsurgical change in RIGHT upper lobe.
2. No evidence of lung cancer recurrence or metastasis

## 2019-02-22 ENCOUNTER — Telehealth: Payer: Self-pay

## 2019-02-22 DIAGNOSIS — Z20822 Contact with and (suspected) exposure to covid-19: Secondary | ICD-10-CM

## 2019-02-22 NOTE — Telephone Encounter (Signed)
Dr. Apolonio Schneiders calling to request COVID 19 testing.

## 2019-02-25 ENCOUNTER — Telehealth: Payer: Self-pay

## 2019-02-25 ENCOUNTER — Other Ambulatory Visit: Payer: Self-pay

## 2019-02-25 DIAGNOSIS — Z20822 Contact with and (suspected) exposure to covid-19: Secondary | ICD-10-CM

## 2019-02-25 NOTE — Telephone Encounter (Signed)
-  Patient was on the 02/25/19 scheduled for COVID testing.  He was late for his appointment and a call was made.  He stated he overslept and would not come today, but he could come tomorrow.  He was informed that he would need to call and reschedule his appointment.  The number [phone]) was provided.

## 2019-03-03 ENCOUNTER — Emergency Department (HOSPITAL_COMMUNITY)
Admission: EM | Admit: 2019-03-03 | Discharge: 2019-03-03 | Disposition: A | Discharge status: Home / Self Care | Payer: Medicare Other | Attending: Emergency Medicine | Admitting: Emergency Medicine

## 2019-03-03 ENCOUNTER — Emergency Department (HOSPITAL_COMMUNITY): Payer: Medicare Other

## 2019-03-03 DIAGNOSIS — Z85118 Personal history of other malignant neoplasm of bronchus and lung: Secondary | ICD-10-CM | POA: Diagnosis not present

## 2019-03-03 DIAGNOSIS — I110 Hypertensive heart disease with heart failure: Secondary | ICD-10-CM | POA: Diagnosis not present

## 2019-03-03 DIAGNOSIS — Z79899 Other long term (current) drug therapy: Secondary | ICD-10-CM | POA: Diagnosis not present

## 2019-03-03 DIAGNOSIS — R197 Diarrhea, unspecified: Secondary | ICD-10-CM | POA: Diagnosis not present

## 2019-03-03 DIAGNOSIS — F17210 Nicotine dependence, cigarettes, uncomplicated: Secondary | ICD-10-CM | POA: Insufficient documentation

## 2019-03-03 DIAGNOSIS — J441 Chronic obstructive pulmonary disease with (acute) exacerbation: Secondary | ICD-10-CM

## 2019-03-03 DIAGNOSIS — I509 Heart failure, unspecified: Secondary | ICD-10-CM | POA: Insufficient documentation

## 2019-03-03 DIAGNOSIS — R0789 Other chest pain: Secondary | ICD-10-CM | POA: Diagnosis present

## 2019-03-03 DIAGNOSIS — Z20828 Contact with and (suspected) exposure to other viral communicable diseases: Secondary | ICD-10-CM | POA: Insufficient documentation

## 2019-03-03 DIAGNOSIS — E119 Type 2 diabetes mellitus without complications: Secondary | ICD-10-CM | POA: Diagnosis not present

## 2019-03-03 DIAGNOSIS — R0602 Shortness of breath: Secondary | ICD-10-CM | POA: Diagnosis not present

## 2019-03-03 LAB — CBC WITH DIFFERENTIAL/PLATELET
Abs Immature Granulocytes: 0.04 10*3/uL (ref 0.00–0.07)
Basophils Absolute: 0 10*3/uL (ref 0.0–0.1)
Basophils Relative: 0 %
Eosinophils Absolute: 0.2 10*3/uL (ref 0.0–0.5)
Eosinophils Relative: 3 %
HCT: 52.1 % — ABNORMAL HIGH (ref 39.0–52.0)
Hemoglobin: 18 g/dL — ABNORMAL HIGH (ref 13.0–17.0)
Immature Granulocytes: 1 %
Lymphocytes Relative: 41 %
Lymphs Abs: 2.8 10*3/uL (ref 0.7–4.0)
MCH: 32.1 pg (ref 26.0–34.0)
MCHC: 34.5 g/dL (ref 30.0–36.0)
MCV: 92.9 fL (ref 80.0–100.0)
Monocytes Absolute: 0.6 10*3/uL (ref 0.1–1.0)
Monocytes Relative: 9 %
Neutro Abs: 3.2 10*3/uL (ref 1.7–7.7)
Neutrophils Relative %: 46 %
Platelets: 120 10*3/uL — ABNORMAL LOW (ref 150–400)
RBC: 5.61 MIL/uL (ref 4.22–5.81)
RDW: 14.6 % (ref 11.5–15.5)
WBC: 6.9 10*3/uL (ref 4.0–10.5)
nRBC: 0 % (ref 0.0–0.2)

## 2019-03-03 LAB — SARS CORONAVIRUS 2 BY RT PCR (HOSPITAL ORDER, PERFORMED IN ~~LOC~~ HOSPITAL LAB): SARS Coronavirus 2: NEGATIVE

## 2019-03-03 LAB — COMPREHENSIVE METABOLIC PANEL
ALT: 111 U/L — ABNORMAL HIGH (ref 0–44)
AST: 138 U/L — ABNORMAL HIGH (ref 15–41)
Albumin: 3.3 g/dL — ABNORMAL LOW (ref 3.5–5.0)
Alkaline Phosphatase: 94 U/L (ref 38–126)
Anion gap: 15 (ref 5–15)
BUN: <5 mg/dL — ABNORMAL LOW (ref 6–20)
CO2: 18 mmol/L — ABNORMAL LOW (ref 22–32)
Calcium: 8.7 mg/dL — ABNORMAL LOW (ref 8.9–10.3)
Chloride: 108 mmol/L (ref 98–111)
Creatinine, Ser: 0.72 mg/dL (ref 0.61–1.24)
GFR calc Af Amer: >60 mL/min (ref >60)
GFR calc non Af Amer: >60 mL/min (ref >60)
Glucose, Bld: 140 mg/dL — ABNORMAL HIGH (ref 70–99)
Potassium: 3.9 mmol/L (ref 3.5–5.1)
Sodium: 141 mmol/L (ref 135–145)
Total Bilirubin: 0.6 mg/dL (ref 0.3–1.2)
Total Protein: 7.1 g/dL (ref 6.5–8.1)

## 2019-03-03 LAB — TROPONIN I
Troponin I: <0.03 ng/mL (ref <0.03)
Troponin I: <0.03 ng/mL (ref <0.03)

## 2019-03-03 LAB — D-DIMER, QUANTITATIVE: D-Dimer, Quant: 2.73 ug/mL-FEU — ABNORMAL HIGH (ref 0.00–0.50)

## 2019-03-03 LAB — BRAIN NATRIURETIC PEPTIDE: B Natriuretic Peptide: 22.3 pg/mL (ref 0.0–100.0)

## 2019-03-03 MED ORDER — IPRATROPIUM BROMIDE 0.02 % IN SOLN
1.0000 mg | Freq: Once | RESPIRATORY_TRACT | Status: AC
  Administered 2019-03-03: 1 mg via RESPIRATORY_TRACT

## 2019-03-03 MED ORDER — ALBUTEROL SULFATE HFA 108 (90 BASE) MCG/ACT IN AERS
8.0000 | INHALATION_SPRAY | Freq: Once | RESPIRATORY_TRACT | Status: DC
  Filled 2019-03-03: qty 6.7

## 2019-03-03 MED ORDER — IOHEXOL 350 MG/ML SOLN
100.0000 mL | Freq: Once | INTRAVENOUS | Status: AC | PRN
  Administered 2019-03-03: 100 mL via INTRAVENOUS

## 2019-03-03 MED ORDER — ALBUTEROL SULFATE (2.5 MG/3ML) 0.083% IN NEBU
5.0000 mg | INHALATION_SOLUTION | Freq: Once | RESPIRATORY_TRACT | Status: AC
  Administered 2019-03-03: 5 mg via RESPIRATORY_TRACT

## 2019-03-03 MED ORDER — PREDNISONE 20 MG PO TABS: 60.0000 mg | ORAL_TABLET | Freq: Every day | ORAL | 0 refills | Status: DC

## 2019-03-03 MED ORDER — AEROCHAMBER PLUS FLO-VU LARGE MISC: 1.0000 | Freq: Once | Status: DC

## 2019-03-03 MED ORDER — METHYLPREDNISOLONE SODIUM SUCC 125 MG IJ SOLR
125.0000 mg | Freq: Once | INTRAMUSCULAR | Status: AC
  Administered 2019-03-03: 125 mg via INTRAVENOUS
  Filled 2019-03-03: qty 2

## 2019-03-03 NOTE — ED Triage Notes (Signed)
Per [facility], pt from home w/ a c/o a s/o central CP that began at ~1800. The CP is non-radiating. 8/10 on pain scale. 12 lead unremarkable - sinus tach. Lung sounds clear, bilaterally.   120/84 RR 20 97% RA CBG 165

## 2019-03-03 NOTE — Discharge Instructions (Signed)
You may use the albuterol inhaler that was provided to you today in the ED while at home.  You may use 2 to 4 puffs every 2-4 hours as needed for shortness of breath and wheezing.  Your cardiac labs, chest x-ray, CTs scan were normal today.  Your COVID swab was negative.
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
## 2019-03-03 NOTE — ED Notes (Signed)
Ambulated pt., started at 70 with no oxygen cannula, pt. walked around nurses station back to room, pt.tolerated well and finished with an O2 sat. [place]

## 2019-03-03 NOTE — ED Notes (Signed)
Pt just returned from c-t

## 2019-03-03 NOTE — ED Notes (Signed)
Chest tightness for a few days

## 2019-03-04 ENCOUNTER — Other Ambulatory Visit: Payer: Self-pay | Admitting: Internal Medicine

## 2019-03-29 ENCOUNTER — Other Ambulatory Visit: Payer: Self-pay | Admitting: Internal Medicine

## 2019-03-29 DIAGNOSIS — J449 Chronic obstructive pulmonary disease, unspecified: Secondary | ICD-10-CM

## 2019-04-12 ENCOUNTER — Ambulatory Visit: Payer: Medicare Other | Admitting: Podiatry

## 2019-04-18 ENCOUNTER — Ambulatory Visit (INDEPENDENT_AMBULATORY_CARE_PROVIDER_SITE_OTHER): Payer: Medicare Other | Admitting: Internal Medicine

## 2019-04-18 ENCOUNTER — Encounter: Payer: Self-pay | Admitting: Internal Medicine

## 2019-04-18 VITALS — Ht 75.0 in | Wt 216.0 lb

## 2019-04-18 DIAGNOSIS — R748 Abnormal levels of other serum enzymes: Secondary | ICD-10-CM | POA: Diagnosis not present

## 2019-04-18 DIAGNOSIS — K7010 Alcoholic hepatitis without ascites: Secondary | ICD-10-CM | POA: Diagnosis not present

## 2019-04-18 DIAGNOSIS — Z8719 Personal history of other diseases of the digestive system: Secondary | ICD-10-CM | POA: Diagnosis not present

## 2019-04-18 DIAGNOSIS — R1012 Left upper quadrant pain: Secondary | ICD-10-CM | POA: Diagnosis not present
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
## 2019-04-18 NOTE — Patient Instructions (Signed)
   It was good to talk to you today I am glad we caught up.  Good job on stopping alcohol.  We think that that has been causing damage to your liver and by staying off of that it should heal up.   You do need to come to our lab to have liver chemistries rechecked and I am also going to double check to make sure you do not have something called hepatitis C.   We will arrange for you to have an upper GI endoscopy to investigate this abdominal pain in the left upper quadrant.  I appreciate the opportunity to care for you. Gatha Mayer, MD, [facility]

## 2019-04-24 ENCOUNTER — Other Ambulatory Visit: Payer: Self-pay | Admitting: Internal Medicine

## 2019-04-24 DIAGNOSIS — J301 Allergic rhinitis due to pollen: Secondary | ICD-10-CM

## 2019-04-26 ENCOUNTER — Other Ambulatory Visit: Payer: Self-pay

## 2019-04-26 ENCOUNTER — Ambulatory Visit (INDEPENDENT_AMBULATORY_CARE_PROVIDER_SITE_OTHER): Payer: Medicare Other | Admitting: Podiatry

## 2019-04-26 ENCOUNTER — Encounter: Payer: Self-pay | Admitting: Podiatry

## 2019-04-26 VITALS — Temp 97.5°F

## 2019-04-26 DIAGNOSIS — E119 Type 2 diabetes mellitus without complications: Secondary | ICD-10-CM | POA: Diagnosis not present

## 2019-04-26 DIAGNOSIS — M79675 Pain in left toe(s): Secondary | ICD-10-CM

## 2019-04-26 DIAGNOSIS — M79674 Pain in right toe(s): Secondary | ICD-10-CM | POA: Diagnosis not present

## 2019-04-26 DIAGNOSIS — B351 Tinea unguium: Secondary | ICD-10-CM

## 2019-04-26 NOTE — Progress Notes (Signed)
This patient presents to the office with chief complaint of long thick nails and diabetic feet.  This patient  says there  is   pain and discomfort due to callus under big toe joint left foot..  This patient says there are long thick painful nails.  These nails are painful walking and wearing shoes.  Patient has no history of infection or drainage from both feet.  Patient is unable to  self treat his own nails . This patient presents  to the office today for treatment of the  long nails and his painful callus.   General Appearance  Alert, conversant and in no acute stress.  Vascular  Dorsalis pedis and posterior tibial  pulses are palpable  bilaterally.  Capillary return is within normal limits  bilaterally. Temperature is within normal limits  bilaterally.  Neurologic  Senn-Weinstein monofilament wire test within normal limits  bilaterally. Muscle power within normal limits bilaterally.  Nails Thick disfigured discolored nails with subungual debris  from hallux to fifth toes bilaterally. No evidence of bacterial infection or drainage bilaterally.  Orthopedic  No limitations of motion of motion feet .  No crepitus or effusions noted.  No bony pathology or digital deformities noted.  Skin  normotropic skin  noted bilaterally.  No signs of infections or ulcers noted.   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 Pinch callus right hallux.  Onychomycosis  Diabetes with no foot complications  Callus B/L  IE  Debride nails x 10.   Debridement of callus  B/L.  RTC 3 months   Gardiner Barefoot DPM

## 2019-05-08 ENCOUNTER — Ambulatory Visit: Payer: Medicare Other | Admitting: Physician Assistant

## 2019-05-10 ENCOUNTER — Other Ambulatory Visit: Payer: Self-pay

## 2019-05-10 ENCOUNTER — Encounter (HOSPITAL_COMMUNITY): Payer: Self-pay | Admitting: Emergency Medicine

## 2019-05-10 DIAGNOSIS — R197 Diarrhea, unspecified: Secondary | ICD-10-CM | POA: Diagnosis not present

## 2019-05-10 DIAGNOSIS — R109 Unspecified abdominal pain: Secondary | ICD-10-CM | POA: Diagnosis not present

## 2019-05-10 DIAGNOSIS — Z5321 Procedure and treatment not carried out due to patient leaving prior to being seen by health care provider: Secondary | ICD-10-CM | POA: Insufficient documentation

## 2019-05-10 LAB — URINALYSIS, ROUTINE W REFLEX MICROSCOPIC
Bilirubin Urine: NEGATIVE
Glucose, UA: 50 mg/dL — AB
Hgb urine dipstick: NEGATIVE
Ketones, ur: NEGATIVE mg/dL
Leukocytes,Ua: NEGATIVE
Nitrite: NEGATIVE
Protein, ur: NEGATIVE mg/dL
Specific Gravity, Urine: 1.001 — ABNORMAL LOW (ref 1.005–1.030)
pH: 5 (ref 5.0–8.0)

## 2019-05-10 LAB — COMPREHENSIVE METABOLIC PANEL
ALT: 116 U/L — ABNORMAL HIGH (ref 0–44)
AST: 123 U/L — ABNORMAL HIGH (ref 15–41)
Albumin: 3.9 g/dL (ref 3.5–5.0)
Alkaline Phosphatase: 98 U/L (ref 38–126)
Anion gap: 14 (ref 5–15)
BUN: 6 mg/dL (ref 6–20)
CO2: 21 mmol/L — ABNORMAL LOW (ref 22–32)
Calcium: 9.1 mg/dL (ref 8.9–10.3)
Chloride: 102 mmol/L (ref 98–111)
Creatinine, Ser: 0.65 mg/dL (ref 0.61–1.24)
GFR calc Af Amer: >60 mL/min (ref >60)
GFR calc non Af Amer: >60 mL/min (ref >60)
Glucose, Bld: 216 mg/dL — ABNORMAL HIGH (ref 70–99)
Potassium: 3.9 mmol/L (ref 3.5–5.1)
Sodium: 137 mmol/L (ref 135–145)
Total Bilirubin: 0.7 mg/dL (ref 0.3–1.2)
Total Protein: 7.7 g/dL (ref 6.5–8.1)

## 2019-05-10 LAB — CBC
HCT: 52.2 % — ABNORMAL HIGH (ref 39.0–52.0)
Hemoglobin: 17.8 g/dL — ABNORMAL HIGH (ref 13.0–17.0)
MCH: 32.3 pg (ref 26.0–34.0)
MCHC: 34.1 g/dL (ref 30.0–36.0)
MCV: 94.7 fL (ref 80.0–100.0)
Platelets: 106 10*3/uL — ABNORMAL LOW (ref 150–400)
RBC: 5.51 MIL/uL (ref 4.22–5.81)
RDW: 13.8 % (ref 11.5–15.5)
WBC: 8.7 10*3/uL (ref 4.0–10.5)
nRBC: 0 % (ref 0.0–0.2)

## 2019-05-10 LAB — LIPASE, BLOOD: Lipase: 45 U/L (ref 11–51)

## 2019-05-10 NOTE — ED Notes (Signed)
Per EMS pt has ETOH on board.

## 2019-05-10 NOTE — ED Notes (Addendum)
Call made for pt in ED lobby for vitals reassessment; no answer x1. BD

## 2019-05-10 NOTE — ED Triage Notes (Signed)
Per [facility] pt from home for abd pains with diarrhea since yesterday. Vitals: CBG 255, temp 97.2, 119/68, 82HR, 96% on RA.  18g left AC.

## 2019-05-11 ENCOUNTER — Emergency Department (HOSPITAL_COMMUNITY)
Admission: EM | Admit: 2019-05-11 | Discharge: 2019-05-11 | Disposition: A | Discharge status: Home / Self Care | Payer: Medicare Other | Attending: Emergency Medicine | Admitting: Emergency Medicine

## 2019-05-11 NOTE — ED Notes (Signed)
Role call for pt status; no answer x3. RN advised. [employer]

## 2019-05-11 NOTE — ED Notes (Signed)
No answer x2 for pt for vitals update. [employer]

## 2019-05-22 ENCOUNTER — Telehealth: Payer: Self-pay | Admitting: Internal Medicine

## 2019-05-22 NOTE — Telephone Encounter (Signed)
No charge. 

## 2019-05-24 ENCOUNTER — Other Ambulatory Visit: Payer: Self-pay | Admitting: Internal Medicine

## 2019-05-24 ENCOUNTER — Encounter: Payer: Medicare Other | Admitting: Internal Medicine

## 2019-06-08 IMAGING — CR DG HAND COMPLETE 3+V*L*
3 series · 3 of 3 positions shown · non-contrast
Comparison: None in PACs

CLINICAL DATA: Left hand pain. Patient reports beginning yoga of
several days ago. No discrete episode of injury.

EXAM:
LEFT HAND - COMPLETE 3+ VIEW

[x hand pa left]
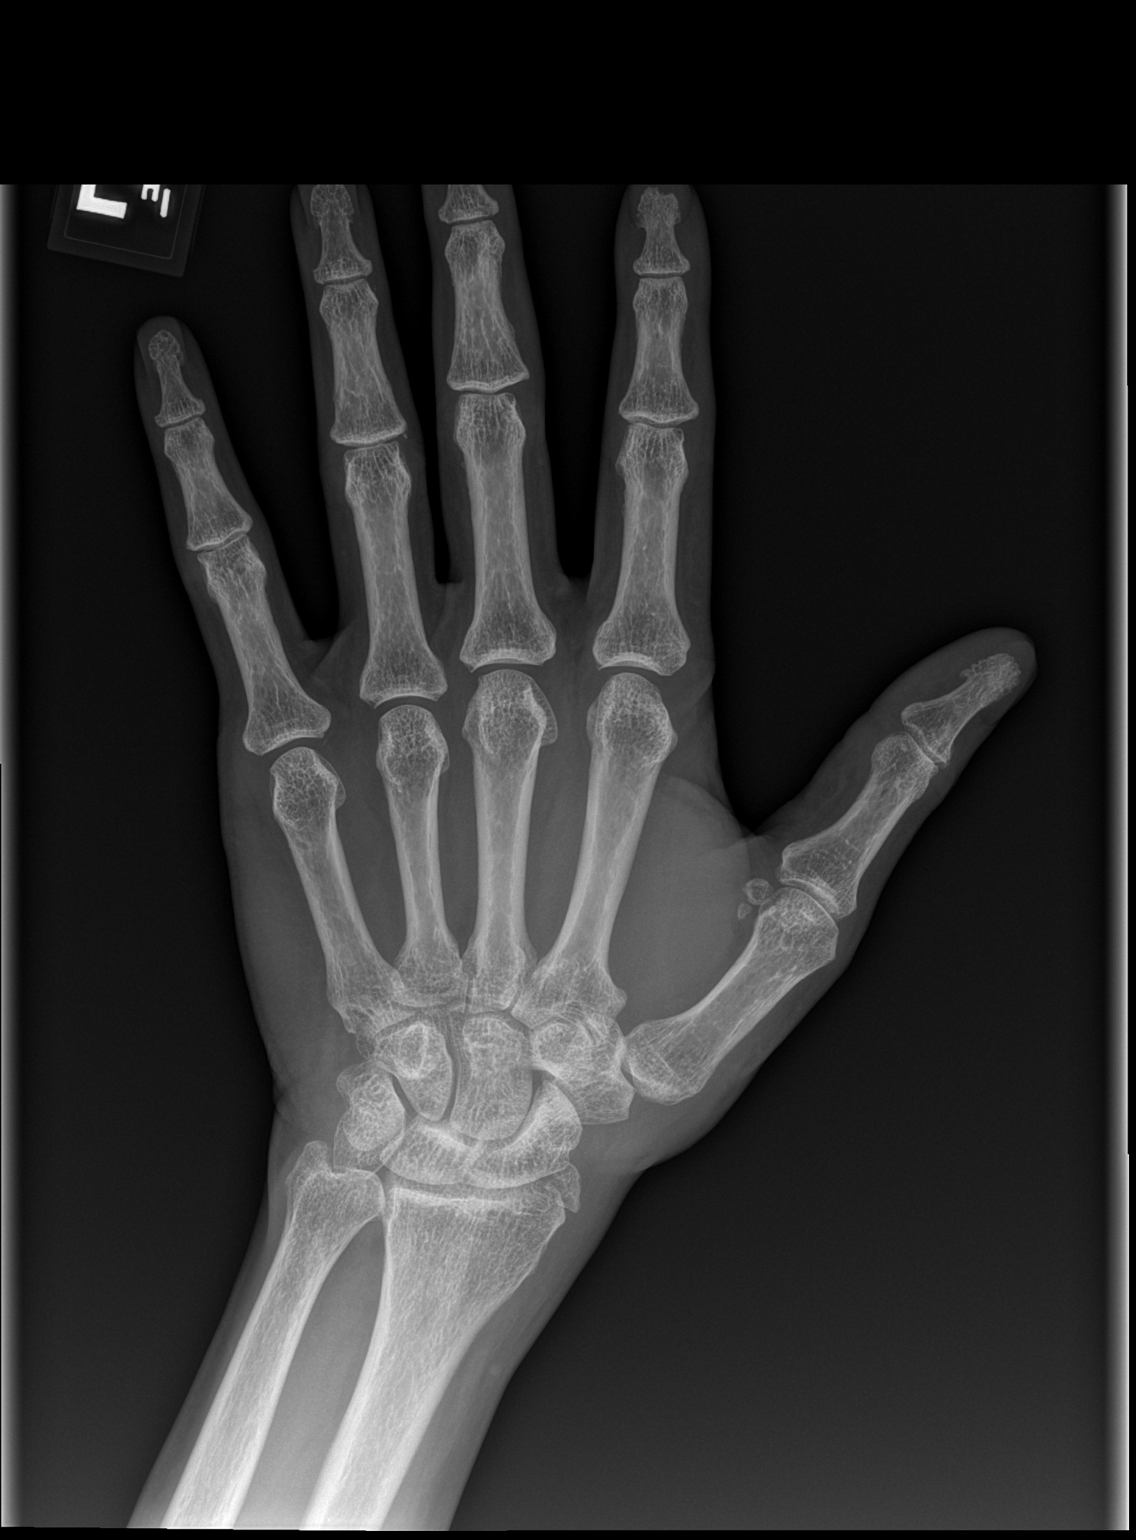

[x hand obl left]
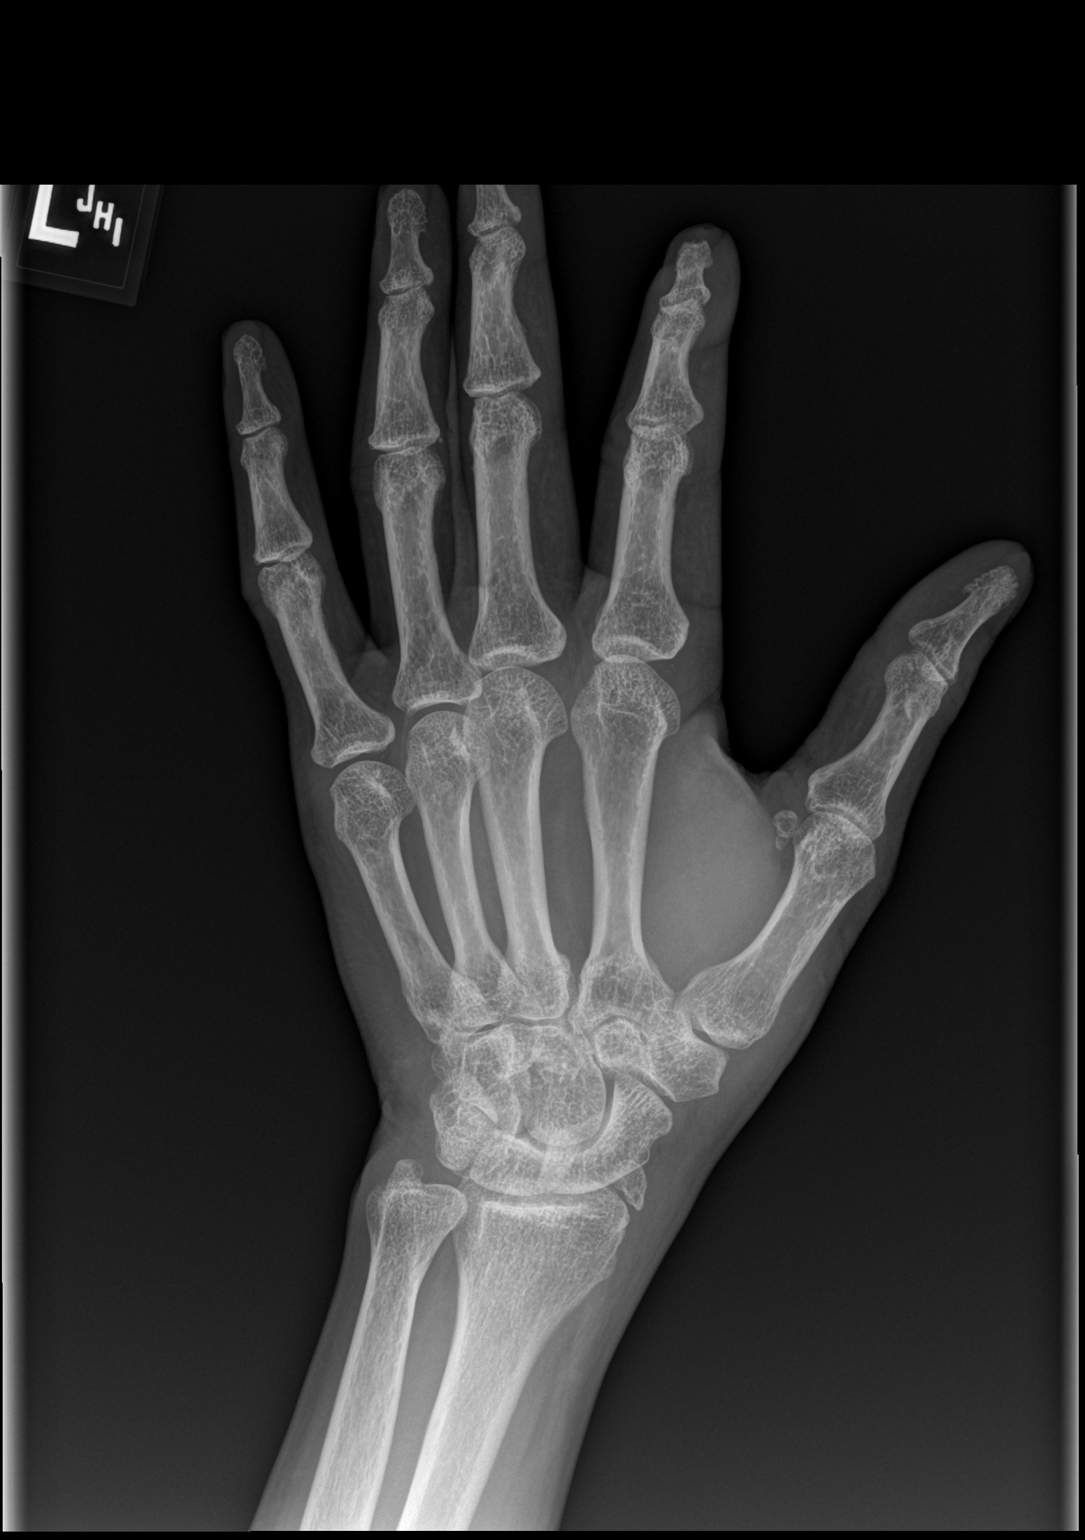

[x hand lat left]
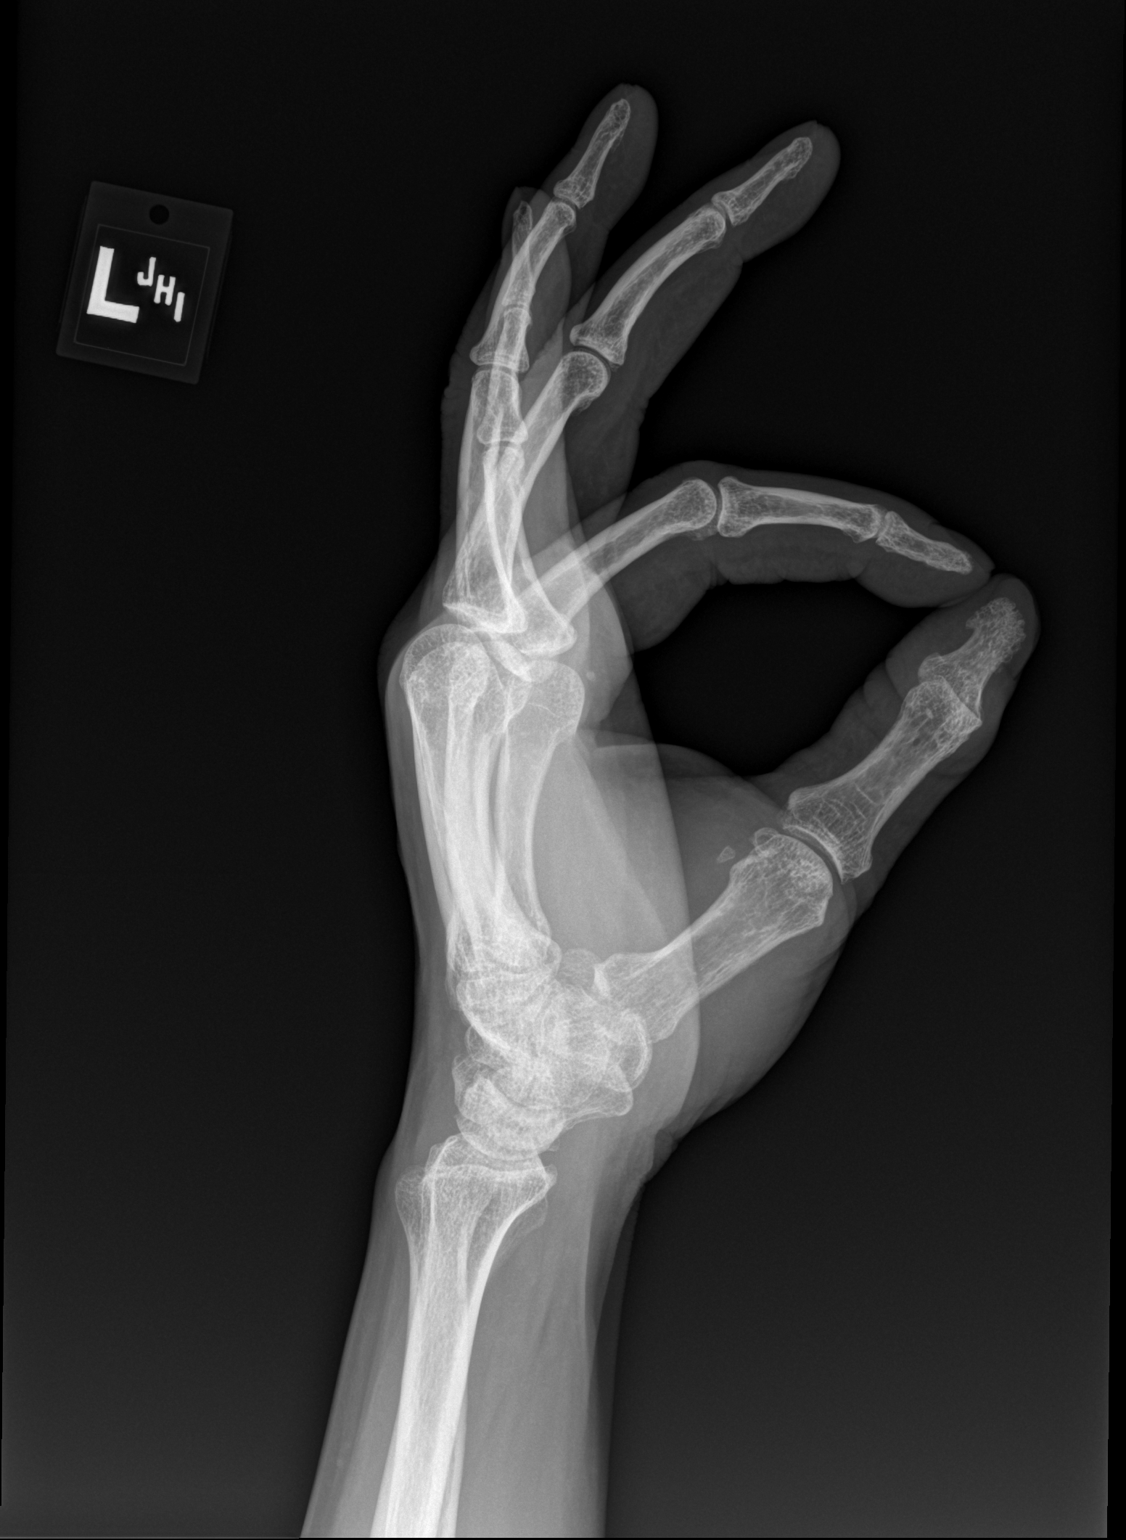

[3 of 3 positions shown; findings below may reference images not displayed]

FINDINGS: The bones are subjectively adequately mineralized. There is no acute
or healing fracture. There is no lytic nor blastic lesion. The IP,
MCP, and CMC joint spaces are well maintained. The carpal bones are
grossly normal. There is likely an old avulsion of the radial
styloid versus an accessory ossicle. The soft tissues are normal.
IMPRESSION: There is no acute or significant chronic bony abnormality of the
left hand.

## 2019-06-10 ENCOUNTER — Ambulatory Visit: Payer: Medicare Other | Admitting: Physician Assistant
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
## 2019-06-11 ENCOUNTER — Ambulatory Visit: Payer: Medicare Other | Admitting: Internal Medicine

## 2019-06-11 ENCOUNTER — Encounter: Payer: Self-pay | Admitting: Internal Medicine

## 2019-06-13 ENCOUNTER — Other Ambulatory Visit (INDEPENDENT_AMBULATORY_CARE_PROVIDER_SITE_OTHER): Payer: Self-pay | Admitting: Orthopedic Surgery

## 2019-06-14 ENCOUNTER — Other Ambulatory Visit (INDEPENDENT_AMBULATORY_CARE_PROVIDER_SITE_OTHER): Payer: Self-pay | Admitting: Orthopedic Surgery

## 2019-06-14 ENCOUNTER — Emergency Department (HOSPITAL_COMMUNITY)
Admission: EM | Admit: 2019-06-14 | Discharge: 2019-06-15 | Disposition: A | Discharge status: Home / Self Care | Payer: Medicare Other | Attending: Emergency Medicine | Admitting: Emergency Medicine

## 2019-06-14 DIAGNOSIS — R1032 Left lower quadrant pain: Secondary | ICD-10-CM | POA: Diagnosis not present

## 2019-06-14 DIAGNOSIS — I110 Hypertensive heart disease with heart failure: Secondary | ICD-10-CM | POA: Insufficient documentation

## 2019-06-14 DIAGNOSIS — R1012 Left upper quadrant pain: Secondary | ICD-10-CM | POA: Insufficient documentation

## 2019-06-14 DIAGNOSIS — K2920 Alcoholic gastritis without bleeding: Secondary | ICD-10-CM | POA: Diagnosis not present

## 2019-06-14 DIAGNOSIS — I708 Atherosclerosis of other arteries: Secondary | ICD-10-CM

## 2019-06-14 DIAGNOSIS — Z79899 Other long term (current) drug therapy: Secondary | ICD-10-CM | POA: Diagnosis not present

## 2019-06-14 DIAGNOSIS — E119 Type 2 diabetes mellitus without complications: Secondary | ICD-10-CM | POA: Diagnosis not present

## 2019-06-14 DIAGNOSIS — F17210 Nicotine dependence, cigarettes, uncomplicated: Secondary | ICD-10-CM | POA: Diagnosis not present

## 2019-06-14 DIAGNOSIS — R109 Unspecified abdominal pain: Secondary | ICD-10-CM

## 2019-06-14 DIAGNOSIS — J449 Chronic obstructive pulmonary disease, unspecified: Secondary | ICD-10-CM | POA: Diagnosis not present

## 2019-06-14 DIAGNOSIS — I700 Atherosclerosis of aorta: Secondary | ICD-10-CM | POA: Diagnosis not present

## 2019-06-14 DIAGNOSIS — I509 Heart failure, unspecified: Secondary | ICD-10-CM | POA: Insufficient documentation

## 2019-06-14 DIAGNOSIS — K279 Peptic ulcer, site unspecified, unspecified as acute or chronic, without hemorrhage or perforation: Secondary | ICD-10-CM

## 2019-06-14 DIAGNOSIS — K760 Fatty (change of) liver, not elsewhere classified: Secondary | ICD-10-CM | POA: Diagnosis not present

## 2019-06-14 DIAGNOSIS — R9431 Abnormal electrocardiogram [ECG] [EKG]: Secondary | ICD-10-CM | POA: Diagnosis not present

## 2019-06-14 DIAGNOSIS — R197 Diarrhea, unspecified: Secondary | ICD-10-CM | POA: Diagnosis not present

## 2019-06-14 LAB — COMPREHENSIVE METABOLIC PANEL
ALT: 231 U/L — ABNORMAL HIGH (ref 0–44)
AST: 380 U/L — ABNORMAL HIGH (ref 15–41)
Albumin: 3.4 g/dL — ABNORMAL LOW (ref 3.5–5.0)
Alkaline Phosphatase: 86 U/L (ref 38–126)
Anion gap: 10 (ref 5–15)
BUN: <5 mg/dL — ABNORMAL LOW (ref 6–20)
CO2: 21 mmol/L — ABNORMAL LOW (ref 22–32)
Calcium: 9.1 mg/dL (ref 8.9–10.3)
Chloride: 101 mmol/L (ref 98–111)
Creatinine, Ser: 0.83 mg/dL (ref 0.61–1.24)
GFR calc Af Amer: >60 mL/min (ref >60)
GFR calc non Af Amer: >60 mL/min (ref >60)
Glucose, Bld: 136 mg/dL — ABNORMAL HIGH (ref 70–99)
Potassium: 3.6 mmol/L (ref 3.5–5.1)
Sodium: 132 mmol/L — ABNORMAL LOW (ref 135–145)
Total Bilirubin: 2 mg/dL — ABNORMAL HIGH (ref 0.3–1.2)
Total Protein: 6.7 g/dL (ref 6.5–8.1)

## 2019-06-14 LAB — URINALYSIS, ROUTINE W REFLEX MICROSCOPIC
Bacteria, UA: NONE SEEN
Bilirubin Urine: NEGATIVE
Glucose, UA: NEGATIVE mg/dL
Hgb urine dipstick: NEGATIVE
Ketones, ur: NEGATIVE mg/dL
Leukocytes,Ua: NEGATIVE
Nitrite: NEGATIVE
Protein, ur: 30 mg/dL — AB
Specific Gravity, Urine: 1.016 (ref 1.005–1.030)
pH: 5 (ref 5.0–8.0)

## 2019-06-14 LAB — CBC
HCT: 47.7 % (ref 39.0–52.0)
Hemoglobin: 16.7 g/dL (ref 13.0–17.0)
MCH: 33 pg (ref 26.0–34.0)
MCHC: 35 g/dL (ref 30.0–36.0)
MCV: 94.3 fL (ref 80.0–100.0)
Platelets: 99 10*3/uL — ABNORMAL LOW (ref 150–400)
RBC: 5.06 MIL/uL (ref 4.22–5.81)
RDW: 13.8 % (ref 11.5–15.5)
WBC: 7.7 10*3/uL (ref 4.0–10.5)
nRBC: 0 % (ref 0.0–0.2)

## 2019-06-14 LAB — LIPASE, BLOOD: Lipase: 36 U/L (ref 11–51)

## 2019-06-14 NOTE — ED Notes (Signed)
PT states he is stepping outside and will return.

## 2019-06-14 NOTE — ED Triage Notes (Signed)
Abdominal pain and diarrhea for a few days that has increased in pain.

## 2019-06-15 ENCOUNTER — Emergency Department (HOSPITAL_COMMUNITY): Payer: Medicare Other

## 2019-06-15 DIAGNOSIS — K760 Fatty (change of) liver, not elsewhere classified: Secondary | ICD-10-CM | POA: Diagnosis not present

## 2019-06-15 DIAGNOSIS — R197 Diarrhea, unspecified: Secondary | ICD-10-CM | POA: Diagnosis not present

## 2019-06-15 DIAGNOSIS — R1032 Left lower quadrant pain: Secondary | ICD-10-CM | POA: Diagnosis not present

## 2019-06-15 MED ORDER — OXYCODONE HCL 5 MG PO TABS
5.0000 mg | ORAL_TABLET | Freq: Once | ORAL | Status: AC
  Administered 2019-06-15: 09:00:00 5 mg via ORAL
  Filled 2019-06-15: qty 1

## 2019-06-15 MED ORDER — ESOMEPRAZOLE MAGNESIUM 40 MG PO CPDR: 40.0000 mg | DELAYED_RELEASE_CAPSULE | Freq: Two times a day (BID) | ORAL | 1 refills | Status: DC

## 2019-06-15 MED ORDER — DICYCLOMINE HCL 10 MG/ML IM SOLN
20.0000 mg | Freq: Once | INTRAMUSCULAR | Status: AC
  Administered 2019-06-15: 09:00:00 20 mg via INTRAMUSCULAR
  Filled 2019-06-15: qty 2

## 2019-06-15 MED ORDER — DICYCLOMINE HCL 20 MG PO TABS: 20.0000 mg | ORAL_TABLET | Freq: Two times a day (BID) | ORAL | 0 refills | Status: DC

## 2019-06-15 MED ORDER — IOHEXOL 300 MG/ML  SOLN
100.0000 mL | Freq: Once | INTRAMUSCULAR | Status: AC | PRN
  Administered 2019-06-15: 08:00:00 100 mL via INTRAVENOUS

## 2019-06-15 NOTE — ED Notes (Signed)
Patient verbalizes understanding of discharge instructions. Opportunity for questioning and answers were provided. Armband removed by staff, pt discharged from ED.  
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
## 2019-06-18 ENCOUNTER — Other Ambulatory Visit (INDEPENDENT_AMBULATORY_CARE_PROVIDER_SITE_OTHER): Payer: Self-pay | Admitting: Orthopedic Surgery

## 2019-06-18 DIAGNOSIS — J449 Chronic obstructive pulmonary disease, unspecified: Secondary | ICD-10-CM | POA: Diagnosis not present

## 2019-06-24 ENCOUNTER — Encounter: Payer: Self-pay | Admitting: Internal Medicine

## 2019-06-24 ENCOUNTER — Ambulatory Visit (INDEPENDENT_AMBULATORY_CARE_PROVIDER_SITE_OTHER): Payer: Medicare Other | Admitting: Internal Medicine

## 2019-06-24 ENCOUNTER — Other Ambulatory Visit: Payer: Self-pay

## 2019-06-24 VITALS — BP 134/84 | HR 105 | Temp 98.3°F | Ht 75.0 in | Wt 208.5 lb

## 2019-06-24 DIAGNOSIS — R351 Nocturia: Secondary | ICD-10-CM

## 2019-06-24 DIAGNOSIS — E785 Hyperlipidemia, unspecified: Secondary | ICD-10-CM

## 2019-06-24 DIAGNOSIS — R972 Elevated prostate specific antigen [PSA]: Secondary | ICD-10-CM

## 2019-06-24 DIAGNOSIS — I429 Cardiomyopathy, unspecified: Secondary | ICD-10-CM

## 2019-06-24 DIAGNOSIS — I10 Essential (primary) hypertension: Secondary | ICD-10-CM

## 2019-06-24 DIAGNOSIS — Z0001 Encounter for general adult medical examination with abnormal findings: Secondary | ICD-10-CM

## 2019-06-24 DIAGNOSIS — Z0000 Encounter for general adult medical examination without abnormal findings: Secondary | ICD-10-CM

## 2019-06-24 DIAGNOSIS — E118 Type 2 diabetes mellitus with unspecified complications: Secondary | ICD-10-CM

## 2019-06-24 DIAGNOSIS — N401 Enlarged prostate with lower urinary tract symptoms: Secondary | ICD-10-CM

## 2019-06-24 DIAGNOSIS — R7303 Prediabetes: Secondary | ICD-10-CM

## 2019-06-24 DIAGNOSIS — R945 Abnormal results of liver function studies: Secondary | ICD-10-CM

## 2019-06-24 DIAGNOSIS — R7989 Other specified abnormal findings of blood chemistry: Secondary | ICD-10-CM | POA: Insufficient documentation

## 2019-06-24 DIAGNOSIS — I739 Peripheral vascular disease, unspecified: Secondary | ICD-10-CM

## 2019-06-24 DIAGNOSIS — A530 Latent syphilis, unspecified as early or late: Secondary | ICD-10-CM

## 2019-06-24 DIAGNOSIS — D696 Thrombocytopenia, unspecified: Secondary | ICD-10-CM

## 2019-06-24 NOTE — Patient Instructions (Signed)

## 2019-06-24 NOTE — Progress Notes (Signed)
Subjective:  Patient ID: [name], male    DOB: Oct 18, 1960  Age: 58 y.o. MRN: [id number]  CC: Annual Exam and Hypertension   HPI [name] presents for a CPX.  He was recently seen in the emergency department for an episode of abdominal pain.  The abdominal pain has resolved.  He was found to have elevated liver enzymes.  He admits he has been consuming alcohol again.  His CT scan of the abdomen was remarkable for hepatic steatosis but was otherwise unremarkable.  He complains of chronic unchanged nonproductive cough.  He continues to smoke cigarettes.  He has had mild weight loss but denies hemoptysis, chest pain, DOE, palpitations, edema, or fatigue.  Outpatient Medications Prior to Visit  Medication Sig Dispense Refill  . acetaminophen (TYLENOL) 325 MG tablet Take 650 mg by mouth every 6 (six) hours as needed for headache.    . albuterol (PROAIR HFA) 108 (90 Base) MCG/ACT inhaler Inhale 2 puffs into the lungs every 4 (four) hours as needed for wheezing. 3 Inhaler 3  . Albuterol Sulfate (PROAIR RESPICLICK) 419 (90 Base) MCG/ACT AEPB Inhale 1-2 puffs into the lungs every 4 (four) hours as needed. 1 each 11  . ALPRAZolam (XANAX) 1 MG tablet Take 1 mg by mouth 3 (three) times daily as needed for anxiety.     [name] ELLIPTA 62.5-25 MCG/INH AEPB Inhale 1 puff into the lungs daily. (Patient taking differently: Inhale 1 puff into the lungs daily. ) 90 each 1  . carvedilol (COREG) 12.5 MG tablet Take 1 tablet (12.5 mg total) by mouth 2 (two) times daily with a meal. Follow- appt due in June must see provider for future refills 180 tablet 1  . esomeprazole (NEXIUM) 40 MG capsule Take 1 capsule (40 mg total) by mouth 2 (two) times daily before a meal. 90 capsule 1  . fluticasone (FLONASE) 50 MCG/ACT nasal spray Place 2 sprays into both nostrils daily. 48 g 1  . LATUDA 40 MG TABS tablet Take 40 mg by mouth every evening.    Marland Kitchen PARoxetine (PAXIL) 20 MG tablet Take 20 mg by mouth daily.     . traZODone (DESYREL) 100 MG tablet Take 3 tablets (300 mg total) by mouth at bedtime. 270 tablet 1  . cetirizine (ZYRTEC) 10 MG tablet Take 1 tablet (10 mg total) by mouth daily. 90 tablet 1  . dicyclomine (BENTYL) 20 MG tablet Take 1 tablet (20 mg total) by mouth 2 (two) times daily. 20 tablet 0  . pravastatin (PRAVACHOL) 40 MG tablet Take 1 tablet (40 mg total) by mouth daily. 90 tablet 1  . emtricitabine-tenofovir (TRUVADA) 200-300 MG tablet Take 1 tablet by mouth daily. 90 tablet 1  . predniSONE (DELTASONE) 20 MG tablet Take 3 tablets (60 mg total) by mouth daily. 15 tablet 0   No facility-administered medications prior to visit.     ROS Review of Systems  Constitutional: Positive for unexpected weight change (wt loss). Negative for chills, diaphoresis and fatigue.  HENT: Negative.  Negative for sore throat and trouble swallowing.   Eyes: Negative.   Respiratory: Positive for cough and shortness of breath. Negative for wheezing.   Cardiovascular: Negative for chest pain, palpitations and leg swelling.  Gastrointestinal: Negative for abdominal pain, constipation, diarrhea, nausea and vomiting.  Endocrine: Negative.   Genitourinary: Negative for difficulty urinating, dysuria, genital sores, hematuria, scrotal swelling, testicular pain and urgency.  Musculoskeletal: Negative.  Negative for arthralgias, back pain and myalgias.  Skin: Negative.  Negative for color change and rash.  Neurological: Negative.  Negative for dizziness, weakness and light-headedness.  Hematological: Negative for adenopathy. Does not bruise/bleed easily.  Psychiatric/Behavioral: Positive for dysphoric mood. Negative for behavioral problems, decreased concentration, self-injury, sleep disturbance and suicidal ideas. The patient is nervous/anxious. The patient is not hyperactive.     Objective:  BP 134/84 (BP Location: Left Arm, Patient Position: Sitting, Cuff Size: Normal)   Pulse (!) 105   Temp 98.3 F (36.8  C) (Oral)   Ht 6\' 3"  (1.905 m)   Wt 208 lb 8 oz (94.6 kg)   SpO2 95%   BMI 26.06 kg/m   BP Readings from Last 3 Encounters:  06/24/19 134/84  06/15/19 (!) 147/88  05/10/19 123/86    Wt Readings from Last 3 Encounters:  06/24/19 208 lb 8 oz (94.6 kg)  04/18/19 216 lb (98 kg)  01/17/19 217 lb (98.4 kg)    Physical Exam Vitals signs reviewed.  Constitutional:      Appearance: Normal appearance.  HENT:     Nose: Nose normal.     Mouth/Throat:     Mouth: Mucous membranes are moist.     Pharynx: Oropharynx is clear.  Eyes:     General: No scleral icterus.    Conjunctiva/sclera: Conjunctivae normal.  Neck:     Musculoskeletal: Normal range of motion and neck supple.  Cardiovascular:     Rate and Rhythm: Regular rhythm. Tachycardia present.     Pulses:          Dorsalis pedis pulses are 1+ on the right side and 1+ on the left side.       Posterior tibial pulses are 1+ on the right side and 1+ on the left side.     Heart sounds: No murmur.     Comments: EKG ---  Sinus  Tachycardia  -Old inferior infarct.    ABNORMAL - no change from the prior EKG  Pulmonary:     Effort: Pulmonary effort is normal. No respiratory distress.     Breath sounds: No stridor. No wheezing, rhonchi or rales.  Abdominal:     General: Abdomen is protuberant. Bowel sounds are normal. There is no distension.     Palpations: There is no hepatomegaly, splenomegaly or mass.     Tenderness: There is no abdominal tenderness. There is no guarding.     Hernia: There is no hernia in the right inguinal area.  Genitourinary:    Pubic Area: No rash.      Penis: Normal and uncircumcised. No discharge or lesions.      Scrotum/Testes: Normal.        Right: Mass or tenderness not present.        Left: Mass or tenderness not present.     Epididymis:     Right: Normal. No mass.     Left: Normal. No mass.     Prostate: Enlarged (1+ smooth symm BPH). Not tender and no nodules present.     Rectum: Normal.  Guaiac result negative. No mass, tenderness, anal fissure, external hemorrhoid or internal hemorrhoid. Normal anal tone.  Musculoskeletal: Normal range of motion.     Right lower leg: No edema.     Left lower leg: No edema.  Feet:     Right foot:     Skin integrity: Skin integrity normal. No skin breakdown.     Toenail Condition: Right toenails are long.     Left foot:     Skin integrity: Skin integrity  normal. No skin breakdown.     Toenail Condition: Left toenails are long.  Lymphadenopathy:     Cervical: No cervical adenopathy.     Lower Body: No right inguinal adenopathy. No left inguinal adenopathy.  Skin:    General: Skin is warm and dry.     Coloration: Skin is not pale.  Neurological:     General: No focal deficit present.     Mental Status: He is alert.  Psychiatric:        Mood and Affect: Mood normal.        Behavior: Behavior normal.     Lab Results  Component Value Date   WBC 7.7 06/26/2019   HGB 18.0 Repeated and verified X2. (HH) 06/26/2019   HCT 52.5 (H) 06/26/2019   PLT 118.0 (L) 06/26/2019   GLUCOSE 168 (H) 06/26/2019   CHOL 181 06/26/2019   TRIG 155.0 (H) 06/26/2019   HDL 33.90 (L) 06/26/2019   LDLDIRECT 122.0 11/30/2016   LDLCALC 116 (H) 06/26/2019   ALT 214 (H) 06/26/2019   AST 259 (H) 06/26/2019   NA 136 06/26/2019   K 4.4 06/26/2019   CL 102 06/26/2019   CREATININE 0.78 06/26/2019   BUN 6 06/26/2019   CO2 24 06/26/2019   TSH 0.90 06/26/2019   PSA 18.22 (H) 06/26/2019   INR 0.99 09/04/2018   HGBA1C 8.1 (H) 06/26/2019   MICROALBUR 0.2 10/08/2008    Ct Abdomen Pelvis W Contrast  Result Date: 06/15/2019 CLINICAL DATA:  Abdominal pain and diarrhea for a few days. Abdominal pain. EXAM: CT ABDOMEN AND PELVIS WITH CONTRAST TECHNIQUE: Multidetector CT imaging of the abdomen and pelvis was performed using the standard protocol following bolus administration of intravenous contrast. CONTRAST:  114mL OMNIPAQUE IOHEXOL 300 MG/ML  SOLN COMPARISON:  CT,  01/17/2019 FINDINGS: Lower chest: No acute abnormality. Hepatobiliary: Decreased attenuation of the liver consistent with fatty infiltration. No liver mass focal lesion. Normal gallbladder. No bile duct dilation. Pancreas: Unremarkable. No pancreatic ductal dilatation or surrounding inflammatory changes. Spleen: Normal in size without focal abnormality. Adrenals/Urinary Tract: No adrenal masses. There are several subcentimeter low-density renal masses bilaterally, largest on the right, anterior mid to lower pole measuring 8 mm. These are consistent with cysts and are stable from prior CT. No other renal masses, no stones and no hydronephrosis. Normal ureters. Normal bladder. Stomach/Bowel: Stomach is within normal limits. Appendix appears normal. No evidence of bowel wall thickening, distention, or inflammatory changes. Vascular/Lymphatic: Ectatic abdominal aorta, maximum AP diameter of 2.4 cm. Aortic atherosclerosis. Significant calcified atherosclerosis in left common iliac artery with narrowing. These findings are stable. No enlarged lymph nodes. Reproductive: Unremarkable. Other: No abdominal wall hernia or abnormality. No abdominopelvic ascites. Musculoskeletal: No fracture or acute finding. No osteoblastic or osteolytic lesions. Chronic avascular necrosis of the left femoral head. IMPRESSION: 1. No acute findings.  No evidence of bowel inflammation. 2. Hepatic steatosis. 3. Aortic atherosclerosis. Significant atherosclerotic narrowing of the left common iliac artery similar to the prior CT. Electronically Signed   By: Lajean Manes M.D.   On: 06/15/2019 09:08    Assessment & Plan:   Carder was seen today for annual exam and hypertension.  Diagnoses and all orders for this visit:  HYPERTENSION, BENIGN- His blood pressure is adequately well controlled. -     Basic metabolic panel; Future -     Urinalysis, Routine w reflex microscopic; Future -     TSH; Future -     CBC with Differential/Platelet;  Future -  EKG 12-Lead  BPH associated with nocturia- His PSA has risen significantly.  I have asked him to see urology to consider evaluation for prostate cancer. -     PSA; Future  Hyperlipidemia with target LDL less than 130- He has not achieved his LDL goal and is not compliant with the statin.  I have asked him to restart pravastatin. -     Lipid panel; Future -     TSH; Future -     Hepatic function panel; Future -     pravastatin (PRAVACHOL) 40 MG tablet; Take 1 tablet (40 mg total) by mouth daily.  Prediabetes- His A1c is up to 8.1%. -     Hemoglobin A1c; Future  Syphili, latent- I will monitor his RPR and his HIV status. -     HIV Antibody (routine testing w rflx); Future -     RPR; Future  Thrombocytopenia ([place])- I will monitor him for vitamin B12 deficiency. -     Vitamin B12; Future  Elevated LFTs- This is most likely alcoholic hepatitis.  I encouraged him to quit drinking.  I will screen him for viral hepatitis. 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 -     Ambulatory referral to Cardiology  Elevated PSA -     Ambulatory referral to Urology  Type II diabetes mellitus with manifestations ([place])- I have asked him to treat this with the metformin and an SGLT2 inhibitor. -     Empagliflozin-metFORMIN HCl (SYNJARDY) 01-999 MG TABS; Take 1 tablet by mouth 2 (two) times daily. -     Amb Referral to Nutrition and Diabetic E -     Consult to [place] Management  PAD (peripheral artery disease) (HCC) -     pravastatin (PRAVACHOL) 40 MG tablet; Take 1 tablet (40 mg total) by mouth daily.   I have discontinued Tirso T. Dunklee's emtricitabine-tenofovir, cetirizine, predniSONE, and dicyclomine. I am also having him start on Synjardy. Additionally, I am having him maintain his traZODone,  carvedilol, Albuterol Sulfate, albuterol, ALPRAZolam, fluticasone, Anoro Ellipta, Latuda, PARoxetine, acetaminophen, esomeprazole, and pravastatin.  Meds ordered this encounter  Medications  . pravastatin (PRAVACHOL) 40 MG tablet    Sig: Take 1 tablet (40 mg total) by mouth daily.    Dispense:  90 tablet    Refill:  1    This prescription was filled on 02/05/2018. Any refills authorized will be placed on file.  . Empagliflozin-metFORMIN HCl (SYNJARDY) 01-999 MG TABS    Sig: Take 1 tablet by mouth 2 (two) times daily.    Dispense:  180 tablet    Refill:  0     Follow-up: Return in about 4 months (around 10/24/2019).  Scarlette Calico, MD

## 2019-06-25 ENCOUNTER — Encounter: Payer: Self-pay | Admitting: Internal Medicine

## 2019-06-26 ENCOUNTER — Encounter: Payer: Self-pay | Admitting: Internal Medicine

## 2019-06-26 ENCOUNTER — Other Ambulatory Visit (INDEPENDENT_AMBULATORY_CARE_PROVIDER_SITE_OTHER): Payer: Medicare Other

## 2019-06-26 DIAGNOSIS — E785 Hyperlipidemia, unspecified: Secondary | ICD-10-CM

## 2019-06-26 DIAGNOSIS — R7989 Other specified abnormal findings of blood chemistry: Secondary | ICD-10-CM

## 2019-06-26 DIAGNOSIS — N401 Enlarged prostate with lower urinary tract symptoms: Secondary | ICD-10-CM | POA: Diagnosis not present

## 2019-06-26 DIAGNOSIS — R351 Nocturia: Secondary | ICD-10-CM

## 2019-06-26 DIAGNOSIS — I10 Essential (primary) hypertension: Secondary | ICD-10-CM

## 2019-06-26 DIAGNOSIS — E118 Type 2 diabetes mellitus with unspecified complications: Secondary | ICD-10-CM

## 2019-06-26 DIAGNOSIS — R945 Abnormal results of liver function studies: Secondary | ICD-10-CM | POA: Diagnosis not present

## 2019-06-26 DIAGNOSIS — R7303 Prediabetes: Secondary | ICD-10-CM | POA: Diagnosis not present

## 2019-06-26 DIAGNOSIS — D696 Thrombocytopenia, unspecified: Secondary | ICD-10-CM

## 2019-06-26 DIAGNOSIS — A530 Latent syphilis, unspecified as early or late: Secondary | ICD-10-CM

## 2019-06-26 DIAGNOSIS — R972 Elevated prostate specific antigen [PSA]: Secondary | ICD-10-CM | POA: Insufficient documentation

## 2019-06-26 HISTORY — DX: Type 2 diabetes mellitus with unspecified complications: E11.8

## 2019-06-26 LAB — BASIC METABOLIC PANEL
BUN: 6 mg/dL (ref 6–23)
CO2: 24 mEq/L (ref 19–32)
Calcium: 9.9 mg/dL (ref 8.4–10.5)
Chloride: 102 mEq/L (ref 96–112)
Creatinine, Ser: 0.78 mg/dL (ref 0.40–1.50)
GFR: 123.1 mL/min (ref >60.00)
Glucose, Bld: 168 mg/dL — ABNORMAL HIGH (ref 70–99)
Potassium: 4.4 mEq/L (ref 3.5–5.1)
Sodium: 136 mEq/L (ref 135–145)

## 2019-06-26 LAB — CBC WITH DIFFERENTIAL/PLATELET
Basophils Absolute: 0 10*3/uL (ref 0.0–0.1)
Basophils Relative: 0.5 % (ref 0.0–3.0)
Eosinophils Absolute: 0.1 10*3/uL (ref 0.0–0.7)
Eosinophils Relative: 0.7 % (ref 0.0–5.0)
HCT: 52.5 % — ABNORMAL HIGH (ref 39.0–52.0)
Hemoglobin: 18 g/dL (ref 13.0–17.0)
Lymphocytes Relative: 25.7 % (ref 12.0–46.0)
Lymphs Abs: 2 10*3/uL (ref 0.7–4.0)
MCHC: 34.3 g/dL (ref 30.0–36.0)
MCV: 95.9 fl (ref 78.0–100.0)
Monocytes Absolute: 0.7 10*3/uL (ref 0.1–1.0)
Monocytes Relative: 9.5 % (ref 3.0–12.0)
Neutro Abs: 4.9 10*3/uL (ref 1.4–7.7)
Neutrophils Relative %: 63.6 % (ref 43.0–77.0)
Platelets: 118 10*3/uL — ABNORMAL LOW (ref 150.0–400.0)
RBC: 5.47 Mil/uL (ref 4.22–5.81)
RDW: 14.4 % (ref 11.5–15.5)
WBC: 7.7 10*3/uL (ref 4.0–10.5)

## 2019-06-26 LAB — TSH: TSH: 0.9 u[IU]/mL (ref 0.35–4.50)

## 2019-06-26 LAB — URINALYSIS, ROUTINE W REFLEX MICROSCOPIC
Hgb urine dipstick: NEGATIVE
Leukocytes,Ua: NEGATIVE
Nitrite: NEGATIVE
RBC / HPF: NONE SEEN (ref 0–?)
Specific Gravity, Urine: >=1.03 — AB (ref 1.000–1.030)
Total Protein, Urine: 30 — AB
Urine Glucose: 100 — AB
Urobilinogen, UA: 4 — AB (ref 0.0–1.0)
WBC, UA: NONE SEEN (ref 0–?)
pH: 5.5 (ref 5.0–8.0)

## 2019-06-26 LAB — LIPID PANEL
Cholesterol: 181 mg/dL (ref 0–200)
HDL: 33.9 mg/dL — ABNORMAL LOW (ref >39.00)
LDL Cholesterol: 116 mg/dL — ABNORMAL HIGH (ref 0–99)
NonHDL: 146.75
Total CHOL/HDL Ratio: 5
Triglycerides: 155 mg/dL — ABNORMAL HIGH (ref 0.0–149.0)
VLDL: 31 mg/dL (ref 0.0–40.0)

## 2019-06-26 LAB — HEPATIC FUNCTION PANEL
ALT: 214 U/L — ABNORMAL HIGH (ref 0–53)
AST: 259 U/L — ABNORMAL HIGH (ref 0–37)
Albumin: 4.2 g/dL (ref 3.5–5.2)
Alkaline Phosphatase: 108 U/L (ref 39–117)
Bilirubin, Direct: 0.4 mg/dL — ABNORMAL HIGH (ref 0.0–0.3)
Total Bilirubin: 1.3 mg/dL — ABNORMAL HIGH (ref 0.2–1.2)
Total Protein: 7.8 g/dL (ref 6.0–8.3)

## 2019-06-26 LAB — HEMOGLOBIN A1C: Hgb A1c MFr Bld: 8.1 % — ABNORMAL HIGH (ref 4.6–6.5)

## 2019-06-26 LAB — PSA: PSA: 18.22 ng/mL — ABNORMAL HIGH (ref 0.10–4.00)

## 2019-06-26 LAB — VITAMIN B12: Vitamin B-12: 615 pg/mL (ref 211–911)

## 2019-06-26 MED ORDER — PRAVASTATIN SODIUM 40 MG PO TABS: 40.0000 mg | ORAL_TABLET | Freq: Every day | ORAL | 1 refills | Status: DC

## 2019-06-26 MED ORDER — SYNJARDY 5-1000 MG PO TABS: 1.0000 | ORAL_TABLET | Freq: Two times a day (BID) | ORAL | 0 refills | Status: DC

## 2019-06-27 ENCOUNTER — Encounter: Payer: Self-pay | Admitting: Internal Medicine

## 2019-06-27 ENCOUNTER — Other Ambulatory Visit (INDEPENDENT_AMBULATORY_CARE_PROVIDER_SITE_OTHER): Payer: Self-pay | Admitting: Orthopedic Surgery

## 2019-06-27 LAB — HEPATITIS B SURFACE ANTIGEN: Hepatitis B Surface Ag: NONREACTIVE

## 2019-06-27 LAB — RPR TITER: RPR Titer: 1:1 {titer} — ABNORMAL HIGH

## 2019-06-27 LAB — RPR: RPR Ser Ql: REACTIVE — AB

## 2019-06-27 LAB — FLUORESCENT TREPONEMAL AB(FTA)-IGG-BLD: Fluorescent Treponemal ABS: REACTIVE — AB

## 2019-06-27 LAB — HEPATITIS C ANTIBODY
Hepatitis C Ab: NONREACTIVE
SIGNAL TO CUT-OFF: 0.06 (ref <1.00)

## 2019-06-27 LAB — HEPATITIS A ANTIBODY, TOTAL: Hepatitis A AB,Total: BORDERLINE — AB

## 2019-06-27 LAB — HIV ANTIBODY (ROUTINE TESTING W REFLEX): HIV 1&2 Ab, 4th Generation: NONREACTIVE

## 2019-06-27 NOTE — Assessment & Plan Note (Signed)
Exam completed Labs reviewed Vaccines reviewed and updated Colon cancer screening is up-to-date Patient education was given

## 2019-06-28 ENCOUNTER — Other Ambulatory Visit: Payer: Self-pay | Admitting: *Deleted

## 2019-06-28 NOTE — Patient Outreach (Addendum)
[place] [facility]) Care Management  06/28/2019  [name] 1961/02/28 [id number]   Referral Date: 06/26/2019 Referral Source: MD office Referral Reason: poorly controlled DM2  Insurance: [place] attempt # 1, successful.  Social: Lives alone but report having support of his friend and Environmental consultant pastor [place].  Report she is also his POA.  He is able to perform all ADLs independently.  Admits that the past several months have been hard on him due to history of depression.  State he has increased his drinking, last drink was yesterday.    Conditions: Per chart has history of diabetes, HTN, cardiomyopathy, COPD, stage 1 lung cancer, paranoid schizophrenia, HLD, and depression.  He does monitor his blood sugar, state he will increase checks to twice a day.  Report since seeing PCP, readings have been 140s - 180s.  Also report starting to check blood pressure, range 120s/70s.  Medications: Reviewed with member, denies the need for medication assistance, state he is able to perform.  Has medications delivered and uses a pill planner that he fills every week.  Appointments: Last visit with PCP on 9/28, recommended follow up in 4 months.  Will have appointments with urology (for elevated PSA), registered dietician, and cardiologist within the next month.  Denies need for transportation stating he likes to ride the city bus for the interaction with people.   Plan: RN CM will continue to follow for diabetes management.  Will follow up within the next 2 weeks.  Will also place referral to social worker for depression and alcohol abuse resources.  Fall Risk  06/28/2019 06/24/2019 04/08/2018 12/04/2016  Falls in the past year? 0 0 No No  Number falls in past yr: 0 0 - -  Injury with Fall? 0 0 - -  Follow up - Falls evaluation completed - -   Depression screen [facility] 2/9 06/28/2019 06/24/2019 04/08/2018 03/26/2018 10/03/2017  Decreased Interest 3 2 0 1 0  Down, Depressed,  Hopeless 3 2 0 0 2  PHQ - 2 Score 6 4 0 1 2  Altered sleeping 0 0 - 2 3  Tired, decreased energy 1 1 - 1 2  Change in appetite 2 2 - 3 1  Feeling bad or failure about yourself  0 0 - 0 2  Trouble concentrating 1 0 - 1 2  Moving slowly or fidgety/restless 1 0 - 1 2  Suicidal thoughts 0 0 - 0 0  PHQ-9 Score 11 7 - 9 14  Difficult doing work/chores Very difficult Somewhat difficult - Somewhat difficult Somewhat difficult  Some recent data might be hidden    [facility] CM Care Plan Problem One     Most Recent Value  Care Plan Problem One  Knowledge deficit related to diabetes management as evidenced by elevated A1C  Role Documenting the Problem One  Care Management Coordinator  Care Plan for Problem One  Active  THN Long Term Goal   Member's A1C will be decreased to </=7 within the next 3 months  THN Long Term Goal Start Date  06/28/19  Interventions for Problem One Long Term Goal  Educated on complications of uncontrolled diabetes. Educated on importance of following diabetes diet guidelines in effort to decrease A1C.  EMMI education assiged regarding Diabetes diet and meal planning  THN CM Short Term Goal #1   Member will report decrease in alcohol comsumption over the next 4 weeks  THN CM Short Term Goal #1 Start Date  06/28/19  Interventions  for Short Term Goal #1  Referral placed to CSW for depression and alcohol abuse resources.  EMMI education assigned regarding alcohol effects on health  THN CM Short Term Goal #2   Member will report checking and recording blood sugar twice a day over the next 4 weeks  THN CM Short Term Goal #2 Start Date  06/28/19  Interventions for Short Term Goal #2  Educated on importance of daily blood sugar checks in effort to effectively manage diabetes on a daily basis.     Valente David, [place], MSN [place] [phone]

## 2019-06-30 ENCOUNTER — Encounter: Payer: Self-pay | Admitting: Internal Medicine

## 2019-07-01 ENCOUNTER — Encounter: Payer: Self-pay | Admitting: *Deleted

## 2019-07-01 ENCOUNTER — Encounter: Payer: Self-pay | Admitting: Internal Medicine

## 2019-07-01 ENCOUNTER — Other Ambulatory Visit: Payer: Self-pay | Admitting: Internal Medicine

## 2019-07-01 DIAGNOSIS — K580 Irritable bowel syndrome with diarrhea: Secondary | ICD-10-CM | POA: Insufficient documentation

## 2019-07-01 MED ORDER — DICYCLOMINE HCL 20 MG PO TABS: 20.0000 mg | ORAL_TABLET | Freq: Three times a day (TID) | ORAL | 0 refills | Status: DC

## 2019-07-01 NOTE — Telephone Encounter (Signed)
Pt contacted and has been taking tylenol for the left sided pain. Pt stated that the Bentyl the ED gave helped with the pain. Pt is requesting an rx for Bentyl. Pt also states that he has not had any alcohol since he read the my chart message.

## 2019-07-02 ENCOUNTER — Other Ambulatory Visit: Payer: Self-pay | Admitting: *Deleted

## 2019-07-02 NOTE — Patient Outreach (Signed)
[place] [facility]) Care Management  07/02/2019  [name] 1961/09/14 [id number]  CSW attempted initial outreach today by phone and was successful in reaching pt.  Pt confirmed his identity and CSW introduced self, role and reason for call. Pt admits to having depression as well as a history of Bipolar Disorder and suicide attempts.  Pt denies being SI/HI at this time and shared with CSW he was hospitalized multiple times for SI/attempts most recently in 2004.  Per pt, his PCP prescribes his RX including his medicines for depression/BP.  He has seen a counselor in the past at [facility] and disconnected after his therapist moved away.  "I liked Alyse Low a lot and how she helped me to cope and manage things". Pt states he is agreeable to seeking a therapist again and would prefer a male and someone at [facility].  CSW offered to mail (pt requests email also) info on [facility] providers at [facility].   Pt reports his depression is improving; "Now that I am back to work I am feeling better". Pt reports he has been drinking less etoh and admits, "I was drinking 12 beers a day".  Pt reports being out of work because of [place] restrictions was very isolating to him. 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 The phone interactions and coworker engagement offers him a feeling of not being so alone.   CSW discussed use of etoh along with his RX (antidpressant and xanax) are not recommended. He admits to having some vague feelings that "someone is out to get me".  CSW offered support and encouragement to pt. Also reminded him of the crisis line and to call 911 if needed. Pt again, feels reassured that he is doing better.   CSW will mail and email pt info on outpatient behavioral health providers and follow up with pt in 2-3 weeks.   ,Eduard Clos, MSW, LCSW Clinical Social Worker  Maili [place], MSW, [place]  Worker  [place] [phone]

## 2019-07-08 ENCOUNTER — Other Ambulatory Visit: Payer: Self-pay | Admitting: *Deleted

## 2019-07-08 DIAGNOSIS — G4733 Obstructive sleep apnea (adult) (pediatric): Secondary | ICD-10-CM | POA: Diagnosis not present

## 2019-07-08 DIAGNOSIS — R269 Unspecified abnormalities of gait and mobility: Secondary | ICD-10-CM | POA: Diagnosis not present

## 2019-07-08 DIAGNOSIS — J449 Chronic obstructive pulmonary disease, unspecified: Secondary | ICD-10-CM | POA: Diagnosis not present

## 2019-07-08 NOTE — Patient Outreach (Signed)
[facility] [facility]) Care Management  07/08/2019  [name] 07-15-1961 [id number]   Call placed to member to complete assessment and follow up on diabetes management.  He report he is doing better, blood sugars are now more controlled. State they are ranging from 110's 140's.  Denies any hyper or hypo glycemic episodes.  State he is doing his best to maintain a healthy diet, has appointment with registered dietician next month.  Had visit with eye doctor a couple months ago and has visit with cardiologist next week.  Denies any urgent concerns at this time, will follow up within the next month.  THN CM Care Plan Problem One     Most Recent Value  Care Plan Problem One  Knowledge deficit related to diabetes management as evidenced by elevated A1C  Role Documenting the Problem One  Care Management [place] for Problem One  Active  THN Long Term Goal   Member's A1C will be decreased to </=7 within the next 3 months  THN Long Term Goal Start Date  06/28/19  Interventions for Problem One Long Term Goal  Member educated on importance of following plan of care (proper diet, MD appointments, etc) in effort to control blood sugar and decrease A1C  THN CM Short Term Goal #1   Member will report decrease in alcohol comsumption over the next 4 weeks  THN CM Short Term Goal #1 Start Date  06/28/19  Interventions for Short Term Goal #1  Reviewed importance of abstinence from alcohol in reference to managing blood sugars  THN CM Short Term Goal #2   Member will report checking and recording blood sugar twice a day over the next 4 weeks  THN CM Short Term Goal #2 Start Date  06/28/19  Interventions for Short Term Goal #2  Reviewed normal range of blood sugars with member, pre and post meals.  THN CM Short Term Goal #3  Member will report meeting with dietician within the next 4 weeks  THN CM Short Term Goal #3 Start Date  07/08/19  Interventions for Short Tern Goal #3  Reviewed  carb modified diet with member.  Reviewed upcoming appointment, provided member with contact information for clinic     Valente David, RN, MSN [place] [employer] [phone]

## 2019-07-16 ENCOUNTER — Ambulatory Visit: Payer: Medicare Other | Admitting: *Deleted

## 2019-07-17 ENCOUNTER — Ambulatory Visit: Payer: Medicare Other | Admitting: Cardiology
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
## 2019-07-18 ENCOUNTER — Other Ambulatory Visit (INDEPENDENT_AMBULATORY_CARE_PROVIDER_SITE_OTHER): Payer: Self-pay | Admitting: Orthopedic Surgery

## 2019-07-18 DIAGNOSIS — J449 Chronic obstructive pulmonary disease, unspecified: Secondary | ICD-10-CM | POA: Diagnosis not present

## 2019-07-19 ENCOUNTER — Other Ambulatory Visit: Payer: Self-pay | Admitting: *Deleted

## 2019-07-19 ENCOUNTER — Ambulatory Visit: Payer: Self-pay | Admitting: *Deleted

## 2019-07-19 NOTE — Patient Outreach (Signed)
[facility]) Care Management  07/19/2019  [name] Nov 23, 1960 [id number]   CSW attempted to reach pt by phone and was unsuccessful. CSW left a HIPPA compliant voice message and will await callback and try again in 3-4 business days if no return call received.    Eduard Clos, MSW, [place] Worker  [place] [phone]

## 2019-07-25 ENCOUNTER — Other Ambulatory Visit (INDEPENDENT_AMBULATORY_CARE_PROVIDER_SITE_OTHER): Payer: Self-pay | Admitting: Orthopedic Surgery

## 2019-07-25 ENCOUNTER — Other Ambulatory Visit: Payer: Self-pay | Admitting: *Deleted

## 2019-07-25 NOTE — Patient Outreach (Signed)
[facility]) Care Management  07/25/2019  [name] August 23, 1961 [id number]   CSW made contact with pt who reports he is doing "much better since I am back to work and getting out".  He feels his depression is minimal (4 out of 10) and is still considering outpatient counseling services.   CSW will request a provider list be mailed to pt of outpatient counselors in the area that are in network with his insurance.  CSW offered support and encouraged pt to continue to seek ways for positive self talk and to reach out if needs/support arise.   Eduard Clos, MSW, [place] Worker  [place] [phone]

## 2019-07-29 ENCOUNTER — Encounter: Payer: Self-pay | Admitting: Internal Medicine

## 2019-07-30 ENCOUNTER — Ambulatory Visit: Payer: Self-pay | Admitting: *Deleted

## 2019-07-31 ENCOUNTER — Other Ambulatory Visit: Payer: Self-pay | Admitting: *Deleted

## 2019-07-31 ENCOUNTER — Other Ambulatory Visit: Payer: Self-pay | Admitting: Internal Medicine

## 2019-07-31 DIAGNOSIS — F418 Other specified anxiety disorders: Secondary | ICD-10-CM

## 2019-07-31 MED ORDER — ALPRAZOLAM 1 MG PO TABS: 1.0000 mg | ORAL_TABLET | Freq: Three times a day (TID) | ORAL | 3 refills | Status: DC | PRN

## 2019-07-31 NOTE — Patient Outreach (Signed)
[facility]) Care Management  07/31/2019  [name] 1961-06-16 [id number]   CSW was unable to reach pt by phone and did leave a HIPPA compliant voice message. CSW will attempt again in 3-4 business days.   Eduard Clos, MSW, [place] Worker  [place] [phone]

## 2019-08-01 ENCOUNTER — Other Ambulatory Visit: Payer: Self-pay

## 2019-08-01 NOTE — Patient Outreach (Signed)
[facility]) Care Management  08/01/2019  SUREN PAYNE 1961/07/11 [id number]   Request from [place], Eduard Clos, to contact patient regarding outpatient mental health therapy providers nearby and in network. Unsuccessful outreach to patient today.  Left voicemail message.  Will attempt to reach again within four business days.  Ronn Melena, BSW Social Worker [phone]

## 2019-08-02 ENCOUNTER — Encounter: Payer: Self-pay | Admitting: Cardiology

## 2019-08-02 ENCOUNTER — Other Ambulatory Visit: Payer: Self-pay

## 2019-08-02 ENCOUNTER — Ambulatory Visit: Payer: Medicare Other | Admitting: Registered"

## 2019-08-02 ENCOUNTER — Ambulatory Visit (INDEPENDENT_AMBULATORY_CARE_PROVIDER_SITE_OTHER): Payer: Medicare Other | Admitting: Cardiology

## 2019-08-02 ENCOUNTER — Ambulatory Visit: Payer: Medicare Other | Admitting: Podiatry

## 2019-08-02 VITALS — BP 120/68 | HR 93 | Ht 75.0 in | Wt 205.4 lb

## 2019-08-02 DIAGNOSIS — I739 Peripheral vascular disease, unspecified: Secondary | ICD-10-CM

## 2019-08-02 DIAGNOSIS — I5022 Chronic systolic (congestive) heart failure: Secondary | ICD-10-CM | POA: Diagnosis not present

## 2019-08-02 DIAGNOSIS — E785 Hyperlipidemia, unspecified: Secondary | ICD-10-CM

## 2019-08-02 DIAGNOSIS — J449 Chronic obstructive pulmonary disease, unspecified: Secondary | ICD-10-CM

## 2019-08-02 DIAGNOSIS — F17210 Nicotine dependence, cigarettes, uncomplicated: Secondary | ICD-10-CM | POA: Diagnosis not present

## 2019-08-02 DIAGNOSIS — F1010 Alcohol abuse, uncomplicated: Secondary | ICD-10-CM
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
## 2019-08-02 NOTE — Patient Instructions (Signed)
Medication Instructions:  Your physician recommends that you continue on your current medications as directed. Please refer to the Current Medication list given to you today.  *If you need a refill on your cardiac medications before your next appointment, please call your pharmacy*  Lab Work: None   If you have labs (blood work) drawn today and your tests are completely normal, you will receive your results only by: Marland Kitchen MyChart Message (if you have MyChart) OR . A paper copy in the mail If you have any lab test that is abnormal or we need to change your treatment, we will call you to review the results.  Testing/Procedures: None   Follow-Up: At [facility], you and your health needs are our priority.  As part of our continuing mission to provide you with exceptional heart care, we have created designated Provider Care Teams.  These Care Teams include your primary Cardiologist (physician) and Advanced Practice Providers (APPs -  Physician Assistants and Nurse Practitioners) who all work together to provide you with the care you need, when you need it.  Your next appointment:   6 months  The format for your next appointment:   In Person  Provider:   You may see Dr.McAlhany  or one of the following Advanced Practice Providers on your designated Care Team:    Melina Copa, PA-C  Ermalinda Barrios, PA-C   Other Instructions Lifestyle Modifications to Prevent and Treat Heart Disease -Recommend heart healthy/Mediterranean diet, with whole grains, fruits, vegetables, fish, lean meats, nuts, olive oil and avocado oil.  -Limit salt intake to less than 2000 mg per day.  -Recommend moderate walking, starting slowly with a few minutes and working up to 3-5 times/week for 30-50 minutes each session. Aim for at least 150 minutes.week. Goal should be pace of 3 miles/hours, or walking 1.5 miles in 30 minutes -Recommend avoidance of tobacco products. Avoid excess alcohol. -Keep blood pressure well  controlled, ideally less than 130/80.

## 2019-08-05 ENCOUNTER — Other Ambulatory Visit: Payer: Self-pay

## 2019-08-05 NOTE — Patient Outreach (Signed)
[facility]) Care Management  08/05/2019  [name] 1961-07-21 [id number]   Request from [place], Eduard Clos, to contact patient regarding outpatient mental health therapy providers nearby and in network. Successful outreach to patient today.  He reported that he contacted his insurance provider regarding therapists in-network, however they would not email or text a list as he requested.  Offered to contact [facility] and have them mail a list, however, patient declined.  Patient stated that he is willing to call therapists in the area to inquire about what type of insurance plans they accept.  Informed him of web site, Psychology Today and he requested that link be emailed to him.  Link was sent today.  Ronn Melena, BSW Social Worker [phone]

## 2019-08-06 ENCOUNTER — Other Ambulatory Visit: Payer: Self-pay | Admitting: *Deleted

## 2019-08-07 NOTE — Patient Outreach (Signed)
[facility]) Care Management  08/07/2019  [name] 1961-08-09 [id number]   CSW spoke briefly with pt who reported he was at  [employer] but doing well. Noted Amber Chrismon, BSW, has communicated and assisted pt with coordination of outpatient mental health therapy.  Pt denied any concerns at this time and asked to call back later.   CSW will reach out to pt again in 3-5 business days if no return call received.  Eduard Clos, MSW, [place] Worker  [place] [phone]

## 2019-08-09 ENCOUNTER — Ambulatory Visit: Payer: Medicare Other | Admitting: Podiatry

## 2019-08-09 ENCOUNTER — Other Ambulatory Visit: Payer: Self-pay | Admitting: *Deleted
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
## 2019-08-12 ENCOUNTER — Other Ambulatory Visit: Payer: Self-pay | Admitting: *Deleted

## 2019-08-12 NOTE — Patient Outreach (Signed)
[facility]) Care Management  08/12/2019  [name] 1961-01-03 [id number]   Call placed to member to follow up on diabetes management.  He report this is not a good time to talk as he is at work.  Will follow up within the next 3-4 business days.   Valente David, [place], MSN [place] [phone]

## 2019-08-15 ENCOUNTER — Other Ambulatory Visit: Payer: Self-pay | Admitting: *Deleted

## 2019-08-15 NOTE — Patient Outreach (Signed)
[facility]) Care Management  08/15/2019  [name] October 14, 1960 [id number]   Outreach attempt #2, successful.  Call placed to member to follow up on diabetes management.  Report he is doing well, blood sugars before meals around 130's, in the afternoon/evenings around 120's.  Noted in chart that he was a no show for his dietician appointment.  State he has already been to the diabetes classes, does not feel he need to attend more.  He has adjusted his diet to decrease carbs and sugars and feel it is working for him.  Also report he has cut down on his drinking and smoking, still not ready to quit completely.  Denies any urgent concerns at this time, will follow up within the next month.  If remain stable, will consider transition to health coach.  THN CM Care Plan Problem One     Most Recent Value  Care Plan Problem One  Knowledge deficit related to diabetes management as evidenced by elevated A1C  Role Documenting the Problem One  Care Management [place] for Problem One  Active  THN Long Term Goal   Member's A1C will be decreased to </=7 within the next 3 months  THN Long Term Goal Start Date  06/28/19  Interventions for Problem One Long Term Goal  RE-educated on appropriate diabetes diet and importance of managing glucose daily in effort to effectively manage diabetes  THN CM Short Term Goal #1   Member will report decrease in alcohol comsumption over the next 4 weeks  THN CM Short Term Goal #1 Start Date  06/28/19  [facility] CM Short Term Goal #1 Met Date  08/15/19  THN CM Short Term Goal #2   Member will report checking and recording blood sugar twice a day over the next 4 weeks  THN CM Short Term Goal #2 Start Date  06/28/19  [facility] CM Short Term Goal #2 Met Date  08/15/19  [facility] CM Short Term Goal #3  Member will report meeting with dietician within the next 4 weeks  THN CM Short Term Goal #3 Start Date  07/08/19  [facility] CM Short Term Goal #3 Met Date  -- [Not  met]     Valente David, RN, MSN [place] Manager [phone]

## 2019-08-16 ENCOUNTER — Ambulatory Visit: Payer: Self-pay | Admitting: *Deleted

## 2019-08-18 DIAGNOSIS — J449 Chronic obstructive pulmonary disease, unspecified: Secondary | ICD-10-CM | POA: Diagnosis not present

## 2019-08-23 ENCOUNTER — Emergency Department (HOSPITAL_COMMUNITY): Payer: Medicare Other

## 2019-08-23 ENCOUNTER — Emergency Department (HOSPITAL_COMMUNITY)
Admission: EM | Admit: 2019-08-23 | Discharge: 2019-08-23 | Discharge status: Against Medical Advice | Payer: Medicare Other | Attending: Emergency Medicine | Admitting: Emergency Medicine

## 2019-08-23 ENCOUNTER — Encounter (HOSPITAL_COMMUNITY): Payer: Self-pay | Admitting: Emergency Medicine

## 2019-08-23 ENCOUNTER — Other Ambulatory Visit: Payer: Self-pay

## 2019-08-23 DIAGNOSIS — R0602 Shortness of breath: Secondary | ICD-10-CM

## 2019-08-23 DIAGNOSIS — Y93E1 Activity, personal bathing and showering: Secondary | ICD-10-CM | POA: Insufficient documentation

## 2019-08-23 DIAGNOSIS — Z7984 Long term (current) use of oral hypoglycemic drugs: Secondary | ICD-10-CM | POA: Diagnosis not present

## 2019-08-23 DIAGNOSIS — R0789 Other chest pain: Secondary | ICD-10-CM | POA: Insufficient documentation

## 2019-08-23 DIAGNOSIS — J449 Chronic obstructive pulmonary disease, unspecified: Secondary | ICD-10-CM | POA: Insufficient documentation

## 2019-08-23 DIAGNOSIS — Y92002 Bathroom of unspecified non-institutional (private) residence single-family (private) house as the place of occurrence of the external cause: Secondary | ICD-10-CM | POA: Insufficient documentation

## 2019-08-23 DIAGNOSIS — R0600 Dyspnea, unspecified: Secondary | ICD-10-CM | POA: Diagnosis not present

## 2019-08-23 DIAGNOSIS — E119 Type 2 diabetes mellitus without complications: Secondary | ICD-10-CM | POA: Insufficient documentation

## 2019-08-23 DIAGNOSIS — I110 Hypertensive heart disease with heart failure: Secondary | ICD-10-CM | POA: Insufficient documentation

## 2019-08-23 DIAGNOSIS — R609 Edema, unspecified: Secondary | ICD-10-CM | POA: Diagnosis not present

## 2019-08-23 DIAGNOSIS — F17210 Nicotine dependence, cigarettes, uncomplicated: Secondary | ICD-10-CM | POA: Insufficient documentation

## 2019-08-23 DIAGNOSIS — S299XXA Unspecified injury of thorax, initial encounter: Secondary | ICD-10-CM | POA: Diagnosis not present

## 2019-08-23 DIAGNOSIS — Z743 Need for continuous supervision: Secondary | ICD-10-CM | POA: Diagnosis not present

## 2019-08-23 DIAGNOSIS — R55 Syncope and collapse: Secondary | ICD-10-CM | POA: Diagnosis not present

## 2019-08-23 DIAGNOSIS — G44309 Post-traumatic headache, unspecified, not intractable: Secondary | ICD-10-CM | POA: Diagnosis not present

## 2019-08-23 DIAGNOSIS — Y999 Unspecified external cause status: Secondary | ICD-10-CM | POA: Insufficient documentation

## 2019-08-23 DIAGNOSIS — I509 Heart failure, unspecified: Secondary | ICD-10-CM | POA: Diagnosis not present

## 2019-08-23 DIAGNOSIS — W0110XA Fall on same level from slipping, tripping and stumbling with subsequent striking against unspecified object, initial encounter: Secondary | ICD-10-CM | POA: Diagnosis not present

## 2019-08-23 DIAGNOSIS — R079 Chest pain, unspecified: Secondary | ICD-10-CM | POA: Diagnosis not present

## 2019-08-23 DIAGNOSIS — R0689 Other abnormalities of breathing: Secondary | ICD-10-CM | POA: Diagnosis not present

## 2019-08-23 DIAGNOSIS — Z20828 Contact with and (suspected) exposure to other viral communicable diseases: Secondary | ICD-10-CM | POA: Insufficient documentation

## 2019-08-23 DIAGNOSIS — W19XXXA Unspecified fall, initial encounter: Secondary | ICD-10-CM

## 2019-08-23 LAB — COMPREHENSIVE METABOLIC PANEL
ALT: 73 U/L — ABNORMAL HIGH (ref 0–44)
AST: 80 U/L — ABNORMAL HIGH (ref 15–41)
Albumin: 3.4 g/dL — ABNORMAL LOW (ref 3.5–5.0)
Alkaline Phosphatase: 73 U/L (ref 38–126)
Anion gap: 12 (ref 5–15)
BUN: <5 mg/dL — ABNORMAL LOW (ref 6–20)
CO2: 21 mmol/L — ABNORMAL LOW (ref 22–32)
Calcium: 8.9 mg/dL (ref 8.9–10.3)
Chloride: 109 mmol/L (ref 98–111)
Creatinine, Ser: 0.83 mg/dL (ref 0.61–1.24)
GFR calc Af Amer: >60 mL/min (ref >60)
GFR calc non Af Amer: >60 mL/min (ref >60)
Glucose, Bld: 108 mg/dL — ABNORMAL HIGH (ref 70–99)
Potassium: 4.2 mmol/L (ref 3.5–5.1)
Sodium: 142 mmol/L (ref 135–145)
Total Bilirubin: 0.9 mg/dL (ref 0.3–1.2)
Total Protein: 6.7 g/dL (ref 6.5–8.1)

## 2019-08-23 LAB — BRAIN NATRIURETIC PEPTIDE: B Natriuretic Peptide: 102.8 pg/mL — ABNORMAL HIGH (ref 0.0–100.0)

## 2019-08-23 LAB — CBC WITH DIFFERENTIAL/PLATELET
Abs Immature Granulocytes: 0.03 10*3/uL (ref 0.00–0.07)
Basophils Absolute: 0 10*3/uL (ref 0.0–0.1)
Basophils Relative: 1 %
Eosinophils Absolute: 0.1 10*3/uL (ref 0.0–0.5)
Eosinophils Relative: 1 %
HCT: 48 % (ref 39.0–52.0)
Hemoglobin: 16.3 g/dL (ref 13.0–17.0)
Immature Granulocytes: 1 %
Lymphocytes Relative: 49 %
Lymphs Abs: 3.2 10*3/uL (ref 0.7–4.0)
MCH: 32 pg (ref 26.0–34.0)
MCHC: 34 g/dL (ref 30.0–36.0)
MCV: 94.3 fL (ref 80.0–100.0)
Monocytes Absolute: 0.5 10*3/uL (ref 0.1–1.0)
Monocytes Relative: 9 %
Neutro Abs: 2.4 10*3/uL (ref 1.7–7.7)
Neutrophils Relative %: 39 %
Platelets: 105 10*3/uL — ABNORMAL LOW (ref 150–400)
RBC: 5.09 MIL/uL (ref 4.22–5.81)
RDW: 13.7 % (ref 11.5–15.5)
WBC: 6.2 10*3/uL (ref 4.0–10.5)
nRBC: 0 % (ref 0.0–0.2)

## 2019-08-23 LAB — TROPONIN I (HIGH SENSITIVITY)
Troponin I (High Sensitivity): 5 ng/L (ref <18)
Troponin I (High Sensitivity): 4 ng/L (ref <18)

## 2019-08-23 MED ORDER — NICOTINE 21 MG/24HR TD PT24
21.0000 mg | MEDICATED_PATCH | Freq: Once | TRANSDERMAL | Status: DC
  Administered 2019-08-23: 21 mg via TRANSDERMAL
  Filled 2019-08-23: qty 1

## 2019-08-23 NOTE — ED Triage Notes (Addendum)
Pt presents to ED from home BIB [facility]. Pt c/o weakness resulting in fall in shower about 3 hours ago. Pt fell face first and hit head and arms. Pt also reports SOB beginning 2h ago. Pt states he can't lay flat and has hx of CHF. Pt reports increased swelling in abdomin. Pt reports ETOH use tonight. EMS VSS.
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
## 2019-08-23 NOTE — ED Notes (Signed)
RN notified by EDT that pt is requesting to leave AMA. RN notified EDP.

## 2019-08-23 NOTE — ED Notes (Addendum)
Pt left AMA, signed AMA form and was discharged by wheelchair. EDP made aware.

## 2019-08-24 LAB — SARS CORONAVIRUS 2 (TAT 6-24 HRS): SARS Coronavirus 2: NEGATIVE

## 2019-08-28 ENCOUNTER — Ambulatory Visit: Payer: Self-pay | Admitting: *Deleted

## 2019-08-29 ENCOUNTER — Other Ambulatory Visit: Payer: Self-pay | Admitting: *Deleted

## 2019-08-30 NOTE — Patient Outreach (Signed)
[place] [facility]) Care Management  08/30/2019  CORDIE BUENING 10-06-1960 [id number]   CSW attempted to reach pt but was unable. CSW left a HIPPA compliant voicemail and will try again in 3-4 business days if no return call received.  Eduard Clos, MSW, [place] Worker  [place] [phone]

## 2019-09-03 ENCOUNTER — Ambulatory Visit: Payer: Self-pay | Admitting: *Deleted

## 2019-09-03 IMAGING — CT CT PARANASAL SINUSES LIMITED
1 of 2 series · 9 of 12 positions shown, 12 images · non-contrast
Comparison: MRI head October 26, 2015.

CLINICAL DATA: Intermittent LEFT epistaxis for a month. History of
lung cancer.

EXAM:
CT PARANASAL SINUS LIMITED WITHOUT CONTRAST
TECHNIQUE: Non-contiguous multidetector CT images of the paranasal sinuses were
obtained in a single plane without contrast.

[Series 4: limited sinus st · axial · 0.28mm/px · z∈[+157,+237]mm · 9 of 11 slices shown, 12 images]
[im 2/11  brain]
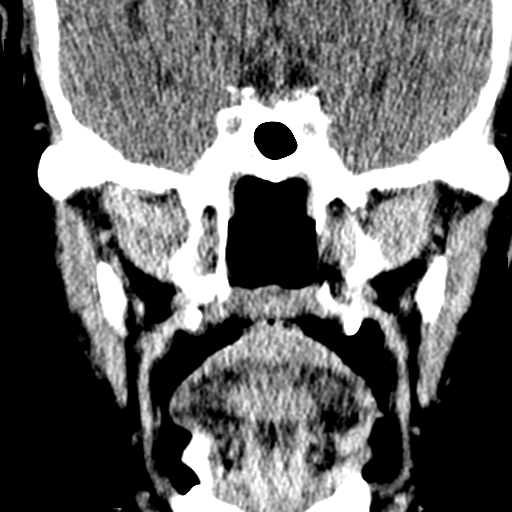
[im 2/11  bone]
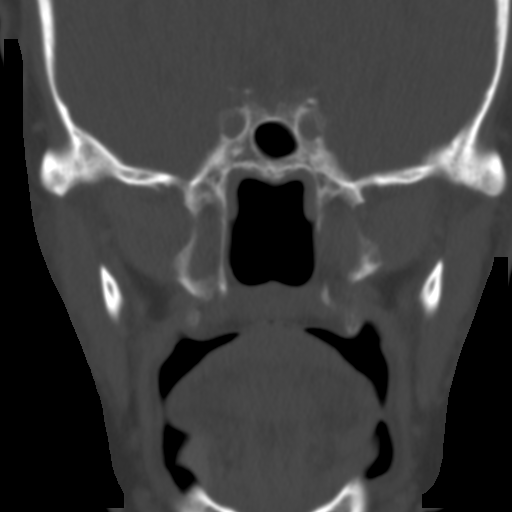
[im 3/11  bone]
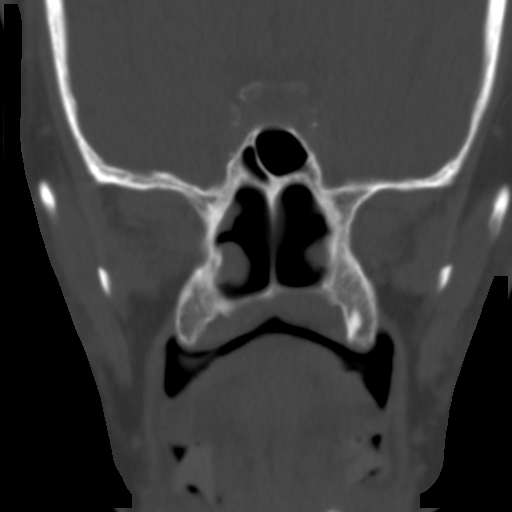
[im 4/11  bone]
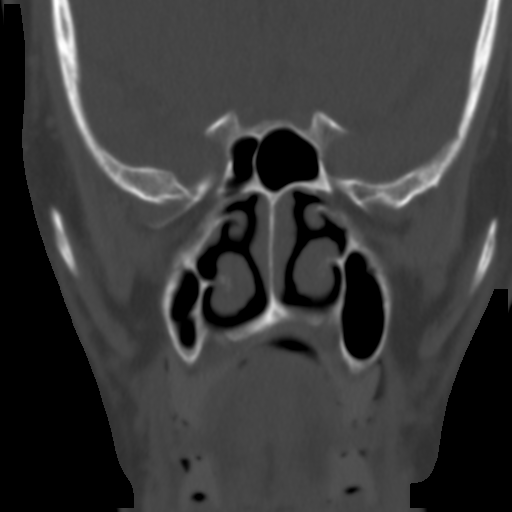
[im 5/11  bone]
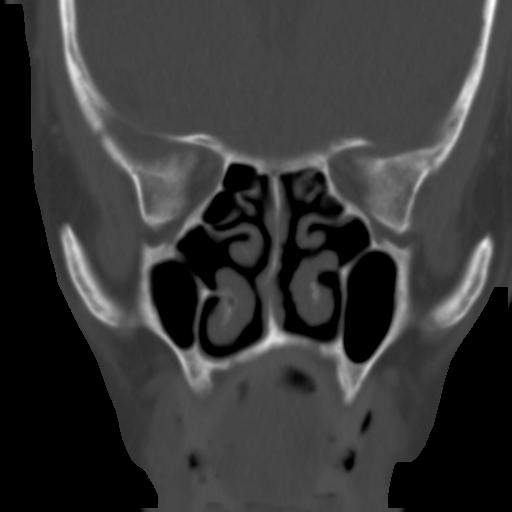
[im 6/11  brain]
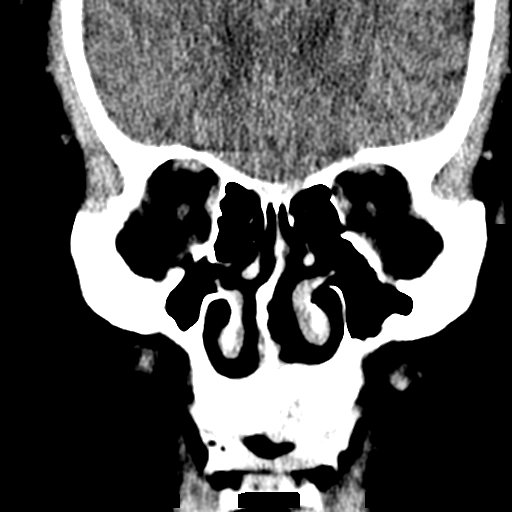
[im 6/11  bone]
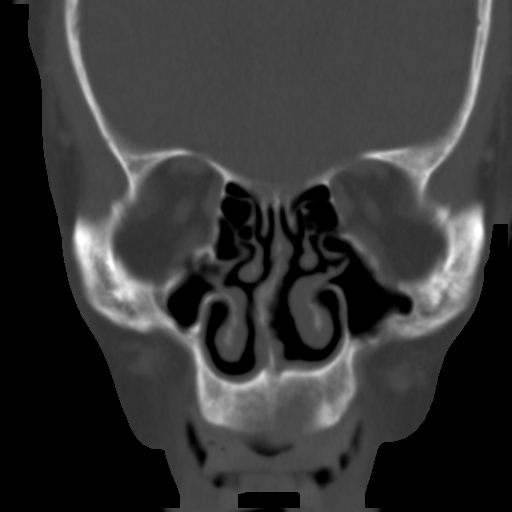
[im 7/11  bone]
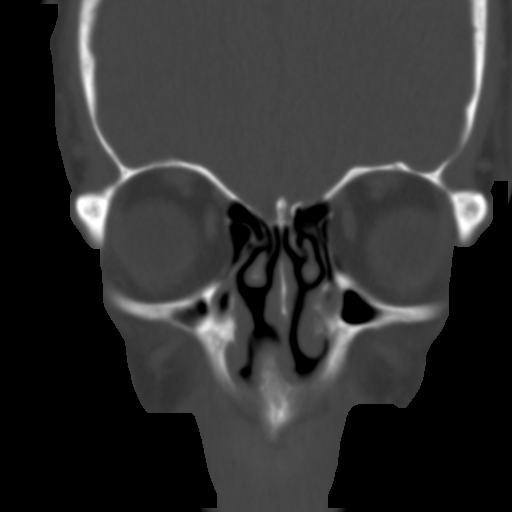
[im 8/11  bone]
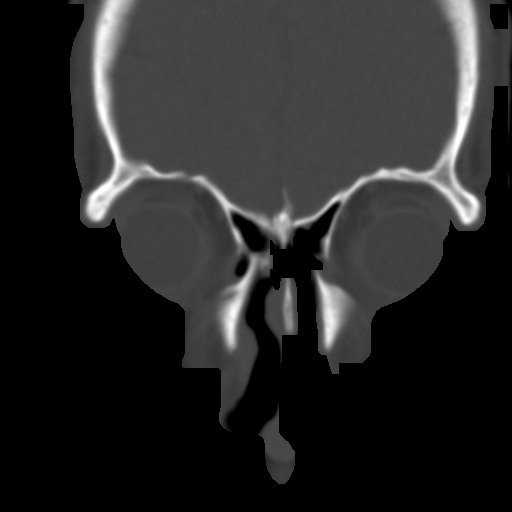
[im 9/11  bone]
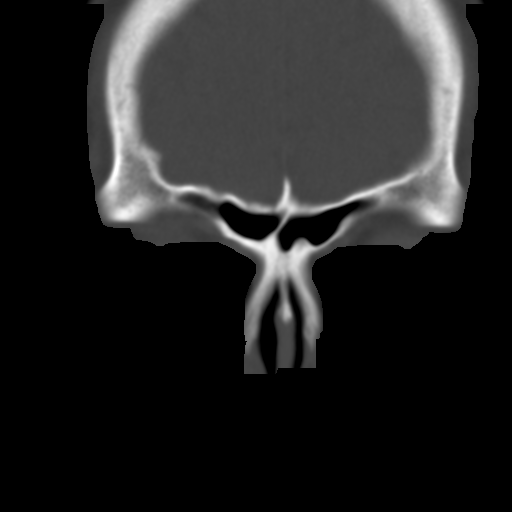
[im 10/11  brain]
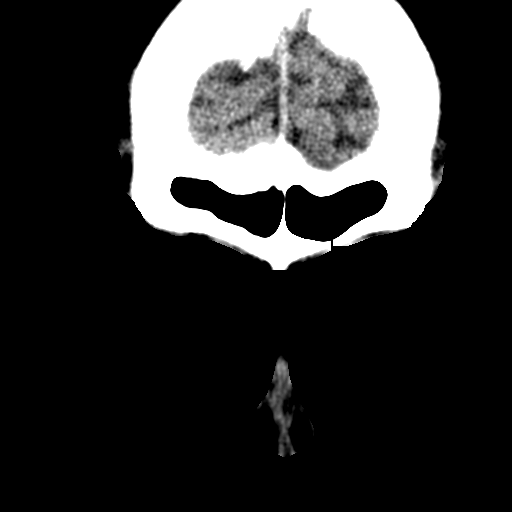
[im 10/11  bone]
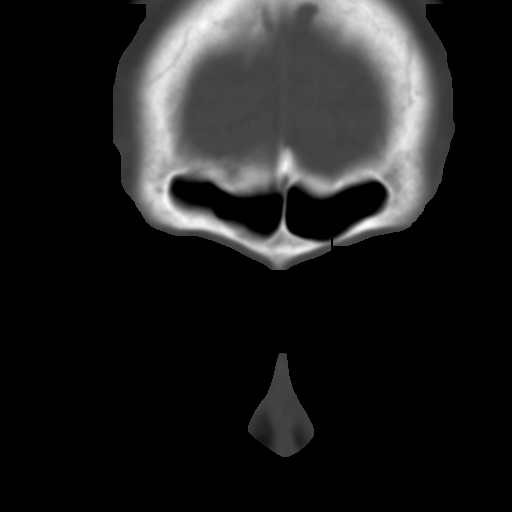

[9 of 12 positions shown; findings below may reference images not displayed]

FINDINGS: SINUSES: Trace LEFT maxillary sinus mucosal thickening without
air-fluid level ethmoid, sphenoid and frontal sinuses are well
aerated. No abnormal antral expansion or atresia, bony wall
thickening/mucoperiosteal reaction. Nasal septum is deviated to the
LEFT

OSTIA: Frontal recesses patent. Ostiomeatal units and sphenoid
ethmoid recesses not included due to slice selection.

FACIAL BONES: No acute facial fracture. No destructive bony lesions.
Patient is edentulous.

ORBITS: Ocular globes and orbital contents are non-suspicious by
low-dose protocol.
IMPRESSION: Trace LEFT maxillary sinus mucosal thickening.

## 2019-09-09 ENCOUNTER — Other Ambulatory Visit: Payer: Self-pay | Admitting: *Deleted

## 2019-09-09 NOTE — Patient Outreach (Signed)
[place] [facility]) Care Management  09/09/2019  [name] January 06, 1961 [id number]   Call placed to member to follow up on diabetes management, state he does not have time to talk, request call back in an hour.    Update @1645 :  Call placed back to member per his request, no answer.  HIPAA compliant voice message left.  Will follow up within the next 3-4 business days.  Valente David, [place], MSN [place] [phone]

## 2019-09-11 ENCOUNTER — Ambulatory Visit: Payer: Self-pay | Admitting: *Deleted

## 2019-09-12 ENCOUNTER — Other Ambulatory Visit: Payer: Self-pay | Admitting: *Deleted

## 2019-09-12 ENCOUNTER — Encounter: Payer: Self-pay | Admitting: *Deleted

## 2019-09-12 NOTE — Patient Outreach (Signed)
[place] [facility]) Care Management  09/12/2019  [name] 27-Feb-1961 [id number]   Outreach attempt #2, successful.    Call placed to member to follow up on diabetes management.  He state he is doing "really good."  Report blood sugars range 120's-130's, denies any hyper/hypoglycemic events.  State he has been compliant with medication management as well as diet management stating "I don't eat no sweets or drink sodas."  Noted that follow up with PCP was recommended for 4 months, last seen in September making him due to visit in January.  Report he will wait until next month to schedule visit, which he will do via My Chart.  Denies needing assistance in doing so.  State he feels he has his healthcare well managed at this time.  Discussed further involvement with [facility], agrees to receiving follow up calls from health coach.  Will place referral for disease management and notify PCP of transition.  THN CM Care Plan Problem One     Most Recent Value  Care Plan Problem One  Knowledge deficit related to diabetes management as evidenced by elevated A1C  Role Documenting the Problem One  Care Management [place] for Problem One  Active  THN Long Term Goal   Member's A1C will be decreased to </=7 within the next 3 months  THN Long Term Goal Start Date  06/28/19  Interventions for Problem One Long Term Goal  Member advised of importance of close follow up with providers in effort to effectively manage chronic conditions. Advised to schedule follow up with PCP.     Valente David, [place], MSN [place] [phone]

## 2019-09-12 NOTE — Patient Outreach (Signed)
[facility]) Care Management  09/12/2019  FADEL CLASON [birth year] [id number]  CSW attempted outreach call to pt on 12/16 and left a HIPPA compliant voice message. CSW will send pt an unsuccessful outreach letter and try againif no return call is received  per policy of 3 attempts in 10 business days.   Eduard Clos, MSW, [place] Worker  [place] [phone]

## 2019-09-13 ENCOUNTER — Ambulatory Visit: Payer: Self-pay | Admitting: *Deleted

## 2019-09-16 ENCOUNTER — Other Ambulatory Visit: Payer: Self-pay | Admitting: *Deleted

## 2019-09-16 ENCOUNTER — Encounter: Payer: Self-pay | Admitting: *Deleted

## 2019-09-16 NOTE — Patient Outreach (Signed)
[place] [facility]) Care Management  09/16/2019  [name] [birth year] [id number]   CSW attempted to reach pt and was unable. CSW left a HIPPA compliant voice message and will plan case closure per policy of 3 attempts in 10 days. CSW will mail pt a closure letter and advise PCP and [facility] team of above.  Eduard Clos, MSW, [place] Worker  [place] [phone]'

## 2019-09-17 DIAGNOSIS — J449 Chronic obstructive pulmonary disease, unspecified: Secondary | ICD-10-CM | POA: Diagnosis not present

## 2019-09-18 ENCOUNTER — Other Ambulatory Visit: Payer: Self-pay | Admitting: *Deleted

## 2019-09-25 ENCOUNTER — Other Ambulatory Visit: Payer: Self-pay

## 2019-09-25 NOTE — Patient Outreach (Signed)
[place] [facility]) Care Management  09/25/2019  [name] April 04, 1961 [id number]   Medication Adherence call to [name]  HIPPA Compliant Voice message left with a call back number. [name] is showing past due on Synjardy 01/999 mg under [place].   [place] Management Direct Dial [phone]  Fax [phone] [name]

## 2019-09-26 ENCOUNTER — Other Ambulatory Visit: Payer: Self-pay | Admitting: Internal Medicine

## 2019-09-26 DIAGNOSIS — E118 Type 2 diabetes mellitus with unspecified complications: Secondary | ICD-10-CM

## 2019-09-26 DIAGNOSIS — K580 Irritable bowel syndrome with diarrhea: Secondary | ICD-10-CM

## 2019-10-01 ENCOUNTER — Other Ambulatory Visit: Payer: Self-pay

## 2019-10-01 NOTE — Patient Outreach (Signed)
[place] [facility]) Care Management  10/01/2019  [name] 09/04/1961 [id number]   Medication Adherence call to [name] spoke with patient he is past due on Synjardy 01/999 mg,patient ask for a call back. [name] is showing past due under [facility] ins.  [place] Management Direct Dial [phone]  Fax [phone] [email or web address]

## 2019-10-02 ENCOUNTER — Encounter: Payer: Self-pay | Admitting: Internal Medicine

## 2019-10-03 ENCOUNTER — Emergency Department (HOSPITAL_COMMUNITY): Payer: Medicare Other

## 2019-10-03 ENCOUNTER — Other Ambulatory Visit: Payer: Self-pay

## 2019-10-03 ENCOUNTER — Encounter (HOSPITAL_COMMUNITY): Payer: Self-pay | Admitting: Emergency Medicine

## 2019-10-03 ENCOUNTER — Emergency Department (HOSPITAL_COMMUNITY)
Admission: EM | Admit: 2019-10-03 | Discharge: 2019-10-03 | Disposition: A | Discharge status: Home / Self Care | Payer: Medicare Other | Attending: Emergency Medicine | Admitting: Emergency Medicine

## 2019-10-03 DIAGNOSIS — I509 Heart failure, unspecified: Secondary | ICD-10-CM | POA: Diagnosis not present

## 2019-10-03 DIAGNOSIS — R079 Chest pain, unspecified: Secondary | ICD-10-CM

## 2019-10-03 DIAGNOSIS — Z20822 Contact with and (suspected) exposure to covid-19: Secondary | ICD-10-CM | POA: Diagnosis not present

## 2019-10-03 DIAGNOSIS — J449 Chronic obstructive pulmonary disease, unspecified: Secondary | ICD-10-CM | POA: Insufficient documentation

## 2019-10-03 DIAGNOSIS — I10 Essential (primary) hypertension: Secondary | ICD-10-CM | POA: Diagnosis not present

## 2019-10-03 DIAGNOSIS — Z79899 Other long term (current) drug therapy: Secondary | ICD-10-CM | POA: Insufficient documentation

## 2019-10-03 DIAGNOSIS — R1012 Left upper quadrant pain: Secondary | ICD-10-CM | POA: Diagnosis not present

## 2019-10-03 DIAGNOSIS — R0602 Shortness of breath: Secondary | ICD-10-CM | POA: Diagnosis not present

## 2019-10-03 DIAGNOSIS — R1032 Left lower quadrant pain: Secondary | ICD-10-CM | POA: Diagnosis not present

## 2019-10-03 DIAGNOSIS — Z743 Need for continuous supervision: Secondary | ICD-10-CM | POA: Diagnosis not present

## 2019-10-03 DIAGNOSIS — I2510 Atherosclerotic heart disease of native coronary artery without angina pectoris: Secondary | ICD-10-CM | POA: Insufficient documentation

## 2019-10-03 DIAGNOSIS — E119 Type 2 diabetes mellitus without complications: Secondary | ICD-10-CM | POA: Insufficient documentation

## 2019-10-03 DIAGNOSIS — R0902 Hypoxemia: Secondary | ICD-10-CM | POA: Diagnosis not present

## 2019-10-03 DIAGNOSIS — R109 Unspecified abdominal pain: Secondary | ICD-10-CM

## 2019-10-03 DIAGNOSIS — R0789 Other chest pain: Secondary | ICD-10-CM | POA: Diagnosis not present

## 2019-10-03 DIAGNOSIS — F17210 Nicotine dependence, cigarettes, uncomplicated: Secondary | ICD-10-CM | POA: Diagnosis not present

## 2019-10-03 DIAGNOSIS — Z85118 Personal history of other malignant neoplasm of bronchus and lung: Secondary | ICD-10-CM | POA: Insufficient documentation

## 2019-10-03 HISTORY — DX: Atherosclerotic heart disease of native coronary artery without angina pectoris: I25.10

## 2019-10-03 LAB — PROTIME-INR
INR: 1 (ref 0.8–1.2)
Prothrombin Time: 13 seconds (ref 11.4–15.2)

## 2019-10-03 LAB — CBC
HCT: 49 % (ref 39.0–52.0)
Hemoglobin: 16.4 g/dL (ref 13.0–17.0)
MCH: 31.5 pg (ref 26.0–34.0)
MCHC: 33.5 g/dL (ref 30.0–36.0)
MCV: 94 fL (ref 80.0–100.0)
Platelets: 121 10*3/uL — ABNORMAL LOW (ref 150–400)
RBC: 5.21 MIL/uL (ref 4.22–5.81)
RDW: 14.6 % (ref 11.5–15.5)
WBC: 6 10*3/uL (ref 4.0–10.5)
nRBC: 0 % (ref 0.0–0.2)

## 2019-10-03 LAB — BASIC METABOLIC PANEL
Anion gap: 11 (ref 5–15)
BUN: <5 mg/dL — ABNORMAL LOW (ref 6–20)
CO2: 22 mmol/L (ref 22–32)
Calcium: 9.3 mg/dL (ref 8.9–10.3)
Chloride: 101 mmol/L (ref 98–111)
Creatinine, Ser: 0.62 mg/dL (ref 0.61–1.24)
GFR calc Af Amer: >60 mL/min (ref >60)
GFR calc non Af Amer: >60 mL/min (ref >60)
Glucose, Bld: 153 mg/dL — ABNORMAL HIGH (ref 70–99)
Potassium: 4.2 mmol/L (ref 3.5–5.1)
Sodium: 134 mmol/L — ABNORMAL LOW (ref 135–145)

## 2019-10-03 LAB — TROPONIN I (HIGH SENSITIVITY)
Troponin I (High Sensitivity): 7 ng/L (ref <18)
Troponin I (High Sensitivity): 5 ng/L (ref <18)

## 2019-10-03 MED ORDER — SODIUM CHLORIDE 0.9% FLUSH: 3.0000 mL | Freq: Once | INTRAVENOUS | Status: DC

## 2019-10-03 MED ORDER — KETOROLAC TROMETHAMINE 15 MG/ML IJ SOLN
15.0000 mg | Freq: Once | INTRAMUSCULAR | Status: AC
  Administered 2019-10-03: 15 mg via INTRAVENOUS
  Filled 2019-10-03: qty 1

## 2019-10-03 NOTE — ED Notes (Signed)
Pt returned to the lobby from XR
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
## 2019-10-03 NOTE — ED Triage Notes (Signed)
Patient arrived with EMS from home reports left chest pain and SOB onset this evening with occasional productive cough , denies fever or chills , no emesis or diaphoresis , history of CAD/CHF/Coronary stents.

## 2019-10-04 LAB — NOVEL CORONAVIRUS, NAA (HOSP ORDER, SEND-OUT TO REF LAB; TAT 18-24 HRS): SARS-CoV-2, NAA: NOT DETECTED

## 2019-10-09 ENCOUNTER — Ambulatory Visit: Payer: Medicare Other | Admitting: Internal Medicine

## 2019-10-09 ENCOUNTER — Encounter (HOSPITAL_COMMUNITY): Payer: Self-pay | Admitting: *Deleted

## 2019-10-09 ENCOUNTER — Emergency Department (HOSPITAL_COMMUNITY)
Admission: EM | Admit: 2019-10-09 | Discharge: 2019-10-09 | Discharge status: Against Medical Advice | Payer: Medicare Other | Attending: Emergency Medicine | Admitting: Emergency Medicine

## 2019-10-09 ENCOUNTER — Other Ambulatory Visit: Payer: Self-pay

## 2019-10-09 DIAGNOSIS — Z5321 Procedure and treatment not carried out due to patient leaving prior to being seen by health care provider: Secondary | ICD-10-CM | POA: Insufficient documentation

## 2019-10-09 DIAGNOSIS — R0689 Other abnormalities of breathing: Secondary | ICD-10-CM | POA: Diagnosis not present

## 2019-10-09 DIAGNOSIS — R1032 Left lower quadrant pain: Secondary | ICD-10-CM | POA: Diagnosis present

## 2019-10-09 DIAGNOSIS — R52 Pain, unspecified: Secondary | ICD-10-CM | POA: Diagnosis not present

## 2019-10-09 DIAGNOSIS — Z743 Need for continuous supervision: Secondary | ICD-10-CM | POA: Diagnosis not present

## 2019-10-09 LAB — COMPREHENSIVE METABOLIC PANEL
ALT: 114 U/L — ABNORMAL HIGH (ref 0–44)
AST: 148 U/L — ABNORMAL HIGH (ref 15–41)
Albumin: 3.8 g/dL (ref 3.5–5.0)
Alkaline Phosphatase: 127 U/L — ABNORMAL HIGH (ref 38–126)
Anion gap: 16 — ABNORMAL HIGH (ref 5–15)
BUN: <5 mg/dL — ABNORMAL LOW (ref 6–20)
CO2: 18 mmol/L — ABNORMAL LOW (ref 22–32)
Calcium: 9.2 mg/dL (ref 8.9–10.3)
Chloride: 104 mmol/L (ref 98–111)
Creatinine, Ser: 0.75 mg/dL (ref 0.61–1.24)
GFR calc Af Amer: >60 mL/min (ref >60)
GFR calc non Af Amer: >60 mL/min (ref >60)
Glucose, Bld: 118 mg/dL — ABNORMAL HIGH (ref 70–99)
Potassium: 4 mmol/L (ref 3.5–5.1)
Sodium: 138 mmol/L (ref 135–145)
Total Bilirubin: 1.1 mg/dL (ref 0.3–1.2)
Total Protein: 8.1 g/dL (ref 6.5–8.1)

## 2019-10-09 LAB — CBC
HCT: 54.7 % — ABNORMAL HIGH (ref 39.0–52.0)
Hemoglobin: 18.6 g/dL — ABNORMAL HIGH (ref 13.0–17.0)
MCH: 32.2 pg (ref 26.0–34.0)
MCHC: 34 g/dL (ref 30.0–36.0)
MCV: 94.6 fL (ref 80.0–100.0)
Platelets: 161 10*3/uL (ref 150–400)
RBC: 5.78 MIL/uL (ref 4.22–5.81)
RDW: 14.6 % (ref 11.5–15.5)
WBC: 7.8 10*3/uL (ref 4.0–10.5)
nRBC: 0 % (ref 0.0–0.2)

## 2019-10-09 LAB — LIPASE, BLOOD: Lipase: 25 U/L (ref 11–51)

## 2019-10-09 MED ORDER — SODIUM CHLORIDE 0.9% FLUSH: 3.0000 mL | Freq: Once | INTRAVENOUS | Status: DC

## 2019-10-09 MED ORDER — OXYCODONE-ACETAMINOPHEN 5-325 MG PO TABS
1.0000 | ORAL_TABLET | ORAL | Status: DC | PRN
  Administered 2019-10-09: 1 via ORAL
  Filled 2019-10-09: qty 1

## 2019-10-09 NOTE — ED Triage Notes (Addendum)
Pt here via [facility] from home c/o L flank pain.  Was seen here on 1/7 for same issue.  Is due for visit with pcp tomorrow for this issue, but couldn't wait d/t the pain.  Denies changes in bowel and bladder habits, though pt states he is more fatigued than normal.  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  Pt was tested on Thursday and he was negative.

## 2019-10-09 NOTE — ED Notes (Signed)
Pt states "he has back pain and can not sit in these chairs or wait that long". Pt advised to stay. Pt seen walking out door.

## 2019-10-10 ENCOUNTER — Ambulatory Visit (INDEPENDENT_AMBULATORY_CARE_PROVIDER_SITE_OTHER): Payer: Medicare Other | Admitting: Internal Medicine

## 2019-10-10 ENCOUNTER — Encounter: Payer: Self-pay | Admitting: Internal Medicine

## 2019-10-10 VITALS — BP 124/80 | HR 90 | Temp 98.4°F | Ht 75.0 in | Wt 195.5 lb

## 2019-10-10 DIAGNOSIS — R972 Elevated prostate specific antigen [PSA]: Secondary | ICD-10-CM

## 2019-10-10 DIAGNOSIS — R10814 Left lower quadrant abdominal tenderness: Secondary | ICD-10-CM | POA: Insufficient documentation

## 2019-10-10 DIAGNOSIS — R7989 Other specified abnormal findings of blood chemistry: Secondary | ICD-10-CM

## 2019-10-10 DIAGNOSIS — G4700 Insomnia, unspecified: Secondary | ICD-10-CM

## 2019-10-10 DIAGNOSIS — G4730 Sleep apnea, unspecified: Secondary | ICD-10-CM

## 2019-10-10 DIAGNOSIS — F418 Other specified anxiety disorders: Secondary | ICD-10-CM

## 2019-10-10 LAB — POC URINALSYSI DIPSTICK (AUTOMATED)
Bilirubin, UA: NEGATIVE
Blood, UA: POSITIVE
Glucose, UA: NEGATIVE
Ketones, UA: NEGATIVE
Leukocytes, UA: NEGATIVE
Nitrite, UA: NEGATIVE
Protein, UA: POSITIVE — AB
Spec Grav, UA: 1.015 (ref 1.010–1.025)
Urobilinogen, UA: 0.2 E.U./dL
pH, UA: 6 (ref 5.0–8.0)

## 2019-10-10 MED ORDER — TRAZODONE HCL 100 MG PO TABS: 300.0000 mg | ORAL_TABLET | Freq: Every day | ORAL | 1 refills | Status: DC

## 2019-10-10 NOTE — Patient Instructions (Signed)
Abdominal Pain, Adult Pain in the abdomen (abdominal pain) can be caused by many things. Often, abdominal pain is not serious and it gets better with no treatment or by being treated at home. However, sometimes abdominal pain is serious. Your health care provider will ask questions about your medical history and do a physical exam to try to determine the cause of your abdominal pain. Follow these instructions at home:  Medicines  Take over-the-counter and prescription medicines only as told by your health care provider.  Do not take a laxative unless told by your health care provider. General instructions  Watch your condition for any changes.  Drink enough fluid to keep your urine pale yellow.  Keep all follow-up visits as told by your health care provider. This is important. Contact a health care provider if:  Your abdominal pain changes or gets worse.  You are not hungry or you lose weight without trying.  You are constipated or have diarrhea for more than 2-3 days.  You have pain when you urinate or have a bowel movement.  Your abdominal pain wakes you up at night.  Your pain gets worse with meals, after eating, or with certain foods.  You are vomiting and cannot keep anything down.  You have a fever.  You have blood in your urine. Get help right away if:  Your pain does not go away as soon as your health care provider told you to expect.  You cannot stop vomiting.  Your pain is only in areas of the abdomen, such as the right side or the left lower portion of the abdomen. Pain on the right side could be caused by appendicitis.  You have bloody or black stools, or stools that look like tar.  You have severe pain, cramping, or bloating in your abdomen.  You have signs of dehydration, such as: ? Dark urine, very little urine, or no urine. ? Cracked lips. ? Dry mouth. ? Sunken eyes. ? Sleepiness. ? Weakness.  You have trouble breathing or chest  pain. Summary  Often, abdominal pain is not serious and it gets better with no treatment or by being treated at home. However, sometimes abdominal pain is serious.  Watch your condition for any changes.  Take over-the-counter and prescription medicines only as told by your health care provider.  Contact a health care provider if your abdominal pain changes or gets worse.  Get help right away if you have severe pain, cramping, or bloating in your abdomen. This information is not intended to replace advice given to you by your health care provider. Make sure you discuss any questions you have with your health care provider. Document Revised: 01/21/2019 Document Reviewed: 01/21/2019 Elsevier Patient Education  2020 [employer]  

## 2019-10-10 NOTE — Progress Notes (Signed)
Subjective:  Patient ID: [name], male    DOB: 12-Nov-1960  Age: 59 y.o. MRN: [id number]  CC: Abdominal Pain  This visit occurred during the SARS-CoV-2 public health emergency.  Safety protocols were in place, including screening questions prior to the visit, additional usage of staff PPE, and extensive cleaning of exam room while observing appropriate contact time as indicated for disinfecting solutions.    HPI [name] presents for f/up - He complains of a 3-week history of left flank and left lower abdominal pain.  He was recently seen in the ED and was told that he needs to have a CT scan of the abdomen but one has not been ordered or completed yet.  He also complains of diarrhea but denies blood or melena.  He has a poor appetite and has lost weight.  He is admits that he is drinking about 7 beers a day and is smoking cigarettes.  He denies nausea, vomiting, fever, chills, dysuria, or hematuria.  Outpatient Medications Prior to Visit  Medication Sig Dispense Refill  . ACCU-CHEK AVIVA PLUS test strip use to check blood sugar every morning    . acetaminophen (TYLENOL) 325 MG tablet Take 650 mg by mouth every 6 (six) hours as needed for headache.    . albuterol (PROAIR HFA) 108 (90 Base) MCG/ACT inhaler Inhale 2 puffs into the lungs every 4 (four) hours as needed for wheezing. 3 Inhaler 3  . albuterol (PROVENTIL) (2.5 MG/3ML) 0.083% nebulizer solution inhale 3 milliliters (2.5 mg) by nebulization route every 6 hours as needed for 30 days    . ALPRAZolam (XANAX) 1 MG tablet Take 1 tablet (1 mg total) by mouth 3 (three) times daily as needed for anxiety. 90 tablet 3  . ANORO ELLIPTA 62.5-25 MCG/INH AEPB Inhale 1 puff into the lungs daily. (Patient taking differently: Inhale 1 puff into the lungs daily. ) 90 each 1  . carvedilol (COREG) 12.5 MG tablet Take 1 tablet (12.5 mg total) by mouth 2 (two) times daily with a meal. Follow- appt due in June must see provider for future refills  180 tablet 1  . cetirizine (ZYRTEC) 10 MG tablet Take 10 mg by mouth daily.    Marland Kitchen dicyclomine (BENTYL) 20 MG tablet Take 1 tablet (20 mg total) by mouth 3 (three) times daily before meals. 270 tablet 0  . esomeprazole (NEXIUM) 40 MG capsule Take 1 capsule (40 mg total) by mouth 2 (two) times daily before a meal. 90 capsule 1  . fluticasone (FLONASE) 50 MCG/ACT nasal spray Place 2 sprays into both nostrils daily. 48 g 1  . pravastatin (PRAVACHOL) 40 MG tablet Take 1 tablet (40 mg total) by mouth daily. 90 tablet 1  . SYNJARDY 01-999 MG TABS Take 1 tablet by mouth 2 (two) times daily. 180 tablet 0  . TRUVADA 200-300 MG tablet Take 1 tablet by mouth daily.    . trazodone (DESYREL) 300 MG tablet Take 300 mg by mouth at bedtime.    . Albuterol Sulfate (PROAIR RESPICLICK) 170 (90 Base) MCG/ACT AEPB Inhale 1-2 puffs into the lungs every 4 (four) hours as needed. 1 each 11  . LATUDA 40 MG TABS tablet Take 40 mg by mouth every evening.    Marland Kitchen PARoxetine (PAXIL) 20 MG tablet Take 20 mg by mouth daily.    . traZODone (DESYREL) 100 MG tablet Take 3 tablets (300 mg total) by mouth at bedtime. 270 tablet 1   No facility-administered medications prior to visit.  ROS Review of Systems  Constitutional: Positive for appetite change and unexpected weight change (wt loss). Negative for chills, diaphoresis, fatigue and fever.  HENT: Negative.  Negative for trouble swallowing and voice change.   Eyes: Negative.   Gastrointestinal: Positive for abdominal pain and diarrhea. Negative for blood in stool, constipation, nausea and vomiting.  Genitourinary: Positive for flank pain. Negative for decreased urine volume, difficulty urinating, dysuria, frequency, hematuria and urgency.  Musculoskeletal: Negative for arthralgias and myalgias.  Skin: Negative.  Negative for color change and rash.  Neurological: Negative.  Negative for dizziness, weakness and headaches.  Hematological: Negative for adenopathy. Does not  bruise/bleed easily.  Psychiatric/Behavioral: Positive for confusion, decreased concentration, dysphoric mood and sleep disturbance. Negative for agitation, behavioral problems, hallucinations, self-injury and suicidal ideas. The patient is nervous/anxious. The patient is not hyperactive.     Objective:  BP 124/80 (BP Location: Left Arm, Patient Position: Sitting, Cuff Size: Normal)   Pulse 90   Temp 98.4 F (36.9 C) (Oral)   Ht 6\' 3"  (1.905 m)   Wt 195 lb 8 oz (88.7 kg)   SpO2 93%   BMI 24.44 kg/m   BP Readings from Last 3 Encounters:  10/10/19 124/80  10/09/19 116/85  10/03/19 130/90    Wt Readings from Last 3 Encounters:  10/10/19 195 lb 8 oz (88.7 kg)  10/09/19 185 lb (83.9 kg)  08/02/19 205 lb 6.4 oz (93.2 kg)    Physical Exam Vitals reviewed.  Constitutional:      General: He is not in acute distress.    Appearance: He is ill-appearing. He is not toxic-appearing or diaphoretic.  HENT:     Nose: Nose normal.     Mouth/Throat:     Mouth: Mucous membranes are moist.  Eyes:     General: No scleral icterus.    Conjunctiva/sclera: Conjunctivae normal.  Cardiovascular:     Rate and Rhythm: Normal rate and regular rhythm.     Heart sounds: No murmur.  Pulmonary:     Effort: Pulmonary effort is normal.     Breath sounds: No stridor. No wheezing, rhonchi or rales.  Abdominal:     General: Abdomen is flat. Bowel sounds are normal. There is no distension.     Palpations: Abdomen is soft. There is no hepatomegaly, splenomegaly or mass.     Tenderness: There is abdominal tenderness in the left lower quadrant. There is no guarding or rebound.     Hernia: No hernia is present.  Musculoskeletal:        General: Normal range of motion.     Right lower leg: No edema.     Left lower leg: No edema.  Skin:    General: Skin is warm and dry.     Coloration: Skin is not pale.     Findings: No rash.  Neurological:     General: No focal deficit present.     Mental Status: He is  alert.  Psychiatric:        Attention and Perception: He is inattentive. He does not perceive auditory or visual hallucinations.        Mood and Affect: Mood normal. Affect is labile.        Speech: Speech is slurred and tangential. Speech is not delayed.        Behavior: Behavior normal. Behavior is not slowed or withdrawn. Behavior is cooperative.        Thought Content: Thought content normal. Thought content is not paranoid or delusional. Thought  content does not include homicidal or suicidal ideation.        Cognition and Memory: Cognition is impaired. Memory is impaired. He exhibits impaired recent memory and impaired remote memory.        Judgment: Judgment normal. Judgment is not impulsive or inappropriate.     Lab Results  Component Value Date   WBC 7.8 10/09/2019   HGB 18.6 (H) 10/09/2019   HCT 54.7 (H) 10/09/2019   PLT 161 10/09/2019   GLUCOSE 118 (H) 10/09/2019   CHOL 181 06/26/2019   TRIG 155.0 (H) 06/26/2019   HDL 33.90 (L) 06/26/2019   LDLDIRECT 122.0 11/30/2016   LDLCALC 116 (H) 06/26/2019   ALT 114 (H) 10/09/2019   AST 148 (H) 10/09/2019   NA 138 10/09/2019   K 4.0 10/09/2019   CL 104 10/09/2019   CREATININE 0.75 10/09/2019   BUN <5 (L) 10/09/2019   CO2 18 (L) 10/09/2019   TSH 0.90 06/26/2019   PSA 18.22 (H) 06/26/2019   INR 1.0 10/03/2019   HGBA1C 8.1 (H) 06/26/2019   MICROALBUR 0.2 10/08/2008   Results for SHAKIL, DIRK (MRN [id number]) as of 10/10/2019 12:57  Ref. Range 10/10/2019 09:58  Bilirubin, UA Unknown Negative  Clarity, UA Unknown cloudy  Color, UA Unknown brown  Glucose Latest Ref Range: Negative  Negative  Ketones, UA Unknown Negative  Leukocytes,UA Latest Ref Range: Negative  Negative  Nitrite, UA Unknown Negative  pH, UA Latest Ref Range: 5.0 - 8.0  6.0  Protein,UA Latest Ref Range: Negative  Positive (A)  Specific Gravity, UA Latest Ref Range: 1.010 - 1.025  1.015  Urobilinogen, UA Latest Ref Range: 0.2 or 1.0 E.U./dL 0.2  RBC, UA  Unknown POSITIVE     No results found.  Assessment & Plan:   Lachlan was seen today for abdominal pain.  Diagnoses and all orders for this visit:  Left lower quadrant abdominal tenderness without rebound tenderness- He has had a 3-week history of left flank and left lower abdominal pain.  His abdominal exam is not consistent with an acute abdominal process today.  His recent labs showed elevated LFTs and an elevated PSA.  Dipstick UA today is positive for blood.  I have asked him to undergo a CT scan of the abdomen and pelvis as soon as possible to screen for renal cancer, renal stone, metastatic disease from prostate cancer, infection (diverticulitis), abscess, and hepatocellular carcinoma, -     CT Abdomen Pelvis W Contrast; Future -     POCT Urinalysis Dipstick (Automated)  Elevated LFTs- See above -     CT Abdomen Pelvis W Contrast; Future  Elevated PSA- See above -     CT Abdomen Pelvis W Contrast; Future  Insomnia w/ sleep apnea -     traZODone (DESYREL) 100 MG tablet; Take 3 tablets (300 mg total) by mouth at bedtime.  Depression with anxiety- I encouraged him to try to quit drinking and to attend AA meetings again.  Will increase the dose of trazodone. -     traZODone (DESYREL) 100 MG tablet; Take 3 tablets (300 mg total) by mouth at bedtime.   I have discontinued Tarance T. Dalesandro's traZODone, Albuterol Sulfate, Latuda, PARoxetine, and trazodone. I am also having him start on traZODone. Additionally, I am having him maintain his carvedilol, albuterol, fluticasone, Anoro Ellipta, acetaminophen, esomeprazole, pravastatin, ALPRAZolam, Synjardy, dicyclomine, albuterol, Truvada, cetirizine, and Accu-Chek Aviva Plus.  Meds ordered this encounter  Medications  . traZODone (DESYREL) 100 MG tablet  Sig: Take 3 tablets (300 mg total) by mouth at bedtime.    Dispense:  270 tablet    Refill:  1     Follow-up: Return in about 3 weeks (around 10/31/2019).  Scarlette Calico, MD

## 2019-10-11 DIAGNOSIS — K760 Fatty (change of) liver, not elsewhere classified: Secondary | ICD-10-CM | POA: Diagnosis not present

## 2019-10-11 DIAGNOSIS — K5790 Diverticulosis of intestine, part unspecified, without perforation or abscess without bleeding: Secondary | ICD-10-CM | POA: Diagnosis not present

## 2019-10-14 ENCOUNTER — Other Ambulatory Visit: Payer: Self-pay

## 2019-10-14 ENCOUNTER — Emergency Department (HOSPITAL_COMMUNITY): Payer: Medicare Other

## 2019-10-14 ENCOUNTER — Other Ambulatory Visit: Payer: Self-pay | Admitting: *Deleted

## 2019-10-14 ENCOUNTER — Emergency Department (HOSPITAL_COMMUNITY)
Admission: EM | Admit: 2019-10-14 | Discharge: 2019-10-14 | Disposition: A | Discharge status: Home / Self Care | Payer: Medicare Other | Attending: Emergency Medicine | Admitting: Emergency Medicine

## 2019-10-14 ENCOUNTER — Encounter (HOSPITAL_COMMUNITY): Payer: Self-pay | Admitting: Emergency Medicine

## 2019-10-14 DIAGNOSIS — J449 Chronic obstructive pulmonary disease, unspecified: Secondary | ICD-10-CM | POA: Insufficient documentation

## 2019-10-14 DIAGNOSIS — Z8711 Personal history of peptic ulcer disease: Secondary | ICD-10-CM | POA: Diagnosis not present

## 2019-10-14 DIAGNOSIS — R1084 Generalized abdominal pain: Secondary | ICD-10-CM | POA: Diagnosis not present

## 2019-10-14 DIAGNOSIS — R319 Hematuria, unspecified: Secondary | ICD-10-CM | POA: Diagnosis not present

## 2019-10-14 DIAGNOSIS — F17210 Nicotine dependence, cigarettes, uncomplicated: Secondary | ICD-10-CM | POA: Insufficient documentation

## 2019-10-14 DIAGNOSIS — Z20822 Contact with and (suspected) exposure to covid-19: Secondary | ICD-10-CM | POA: Insufficient documentation

## 2019-10-14 DIAGNOSIS — R1032 Left lower quadrant pain: Secondary | ICD-10-CM | POA: Insufficient documentation

## 2019-10-14 DIAGNOSIS — R109 Unspecified abdominal pain: Secondary | ICD-10-CM

## 2019-10-14 DIAGNOSIS — Z79899 Other long term (current) drug therapy: Secondary | ICD-10-CM | POA: Diagnosis not present

## 2019-10-14 DIAGNOSIS — K219 Gastro-esophageal reflux disease without esophagitis: Secondary | ICD-10-CM | POA: Diagnosis not present

## 2019-10-14 DIAGNOSIS — E119 Type 2 diabetes mellitus without complications: Secondary | ICD-10-CM | POA: Diagnosis not present

## 2019-10-14 DIAGNOSIS — Z7984 Long term (current) use of oral hypoglycemic drugs: Secondary | ICD-10-CM | POA: Diagnosis not present

## 2019-10-14 DIAGNOSIS — I110 Hypertensive heart disease with heart failure: Secondary | ICD-10-CM | POA: Diagnosis not present

## 2019-10-14 DIAGNOSIS — I509 Heart failure, unspecified: Secondary | ICD-10-CM | POA: Insufficient documentation

## 2019-10-14 DIAGNOSIS — Z03818 Encounter for observation for suspected exposure to other biological agents ruled out: Secondary | ICD-10-CM | POA: Diagnosis not present

## 2019-10-14 DIAGNOSIS — Z743 Need for continuous supervision: Secondary | ICD-10-CM | POA: Diagnosis not present

## 2019-10-14 LAB — COMPREHENSIVE METABOLIC PANEL
ALT: 63 U/L — ABNORMAL HIGH (ref 0–44)
AST: 66 U/L — ABNORMAL HIGH (ref 15–41)
Albumin: 3.5 g/dL (ref 3.5–5.0)
Alkaline Phosphatase: 93 U/L (ref 38–126)
Anion gap: 12 (ref 5–15)
BUN: <5 mg/dL — ABNORMAL LOW (ref 6–20)
CO2: 24 mmol/L (ref 22–32)
Calcium: 9.5 mg/dL (ref 8.9–10.3)
Chloride: 102 mmol/L (ref 98–111)
Creatinine, Ser: 0.88 mg/dL (ref 0.61–1.24)
GFR calc Af Amer: >60 mL/min (ref >60)
GFR calc non Af Amer: >60 mL/min (ref >60)
Glucose, Bld: 100 mg/dL — ABNORMAL HIGH (ref 70–99)
Potassium: 4.4 mmol/L (ref 3.5–5.1)
Sodium: 138 mmol/L (ref 135–145)
Total Bilirubin: 1.7 mg/dL — ABNORMAL HIGH (ref 0.3–1.2)
Total Protein: 7.3 g/dL (ref 6.5–8.1)

## 2019-10-14 LAB — URINALYSIS, ROUTINE W REFLEX MICROSCOPIC
Bilirubin Urine: NEGATIVE
Glucose, UA: >=500 mg/dL — AB
Hgb urine dipstick: NEGATIVE
Ketones, ur: 20 mg/dL — AB
Leukocytes,Ua: NEGATIVE
Nitrite: NEGATIVE
Protein, ur: 30 mg/dL — AB
Specific Gravity, Urine: 1.022 (ref 1.005–1.030)
pH: 5 (ref 5.0–8.0)

## 2019-10-14 LAB — LIPASE, BLOOD: Lipase: 23 U/L (ref 11–51)

## 2019-10-14 LAB — CBC WITH DIFFERENTIAL/PLATELET
Abs Immature Granulocytes: 0.04 10*3/uL (ref 0.00–0.07)
Basophils Absolute: 0 10*3/uL (ref 0.0–0.1)
Basophils Relative: 0 %
Eosinophils Absolute: 0.1 10*3/uL (ref 0.0–0.5)
Eosinophils Relative: 1 %
HCT: 50.5 % (ref 39.0–52.0)
Hemoglobin: 17.1 g/dL — ABNORMAL HIGH (ref 13.0–17.0)
Immature Granulocytes: 1 %
Lymphocytes Relative: 31 %
Lymphs Abs: 2.4 10*3/uL (ref 0.7–4.0)
MCH: 32.2 pg (ref 26.0–34.0)
MCHC: 33.9 g/dL (ref 30.0–36.0)
MCV: 95.1 fL (ref 80.0–100.0)
Monocytes Absolute: 0.6 10*3/uL (ref 0.1–1.0)
Monocytes Relative: 8 %
Neutro Abs: 4.7 10*3/uL (ref 1.7–7.7)
Neutrophils Relative %: 59 %
Platelets: 106 10*3/uL — ABNORMAL LOW (ref 150–400)
RBC: 5.31 MIL/uL (ref 4.22–5.81)
RDW: 14.4 % (ref 11.5–15.5)
WBC: 7.9 10*3/uL (ref 4.0–10.5)
nRBC: 0 % (ref 0.0–0.2)

## 2019-10-14 LAB — SARS CORONAVIRUS 2 (TAT 6-24 HRS): SARS Coronavirus 2: NEGATIVE

## 2019-10-14 NOTE — Discharge Instructions (Addendum)
There were no serious problems found today. There is no blood in your urine today. You continue to have some mild inflammation in your liver, which will need follow-up by your doctor. Your blood sugar was slightly elevated at 100. Some of these abnormalities may improve by drinking more water. Try to drink 1 to 2 L of water each day. You can use heat on sore areas 3-4 times a day and take Tylenol for pain.

## 2019-10-14 NOTE — Patient Outreach (Signed)
[facility]) Care Management  10/14/2019  [name] February 26, 1961 [id number]   RN Health Coach attempted follow up outreach call to patient.  Patient was unavailable. HIPPA compliance voicemail message left with return callback number.  Plan: RN will call patient again within 30 days.  [facility] [phone]

## 2019-10-14 NOTE — ED Notes (Signed)
Patient verbalizes understanding of discharge instructions. Opportunity for questioning and answers were provided. Armband removed by staff, pt discharged from ED.  
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
## 2019-10-14 NOTE — ED Triage Notes (Signed)
Pt reports left flank pain worsening this morning. Arrives via EMS. Ambulatory. Denies recent fevers, vomiting. VSS. Had CT done Friday, waiting results.

## 2019-10-14 NOTE — ED Notes (Signed)
Pt aware we need a urine specimen

## 2019-10-17 ENCOUNTER — Other Ambulatory Visit: Payer: Self-pay | Admitting: Internal Medicine

## 2019-10-17 ENCOUNTER — Other Ambulatory Visit (INDEPENDENT_AMBULATORY_CARE_PROVIDER_SITE_OTHER): Payer: Self-pay | Admitting: Orthopedic Surgery

## 2019-10-17 DIAGNOSIS — K580 Irritable bowel syndrome with diarrhea: Secondary | ICD-10-CM

## 2019-10-18 DIAGNOSIS — J449 Chronic obstructive pulmonary disease, unspecified: Secondary | ICD-10-CM | POA: Diagnosis not present

## 2019-10-21 ENCOUNTER — Other Ambulatory Visit: Payer: Self-pay | Admitting: Internal Medicine

## 2019-10-21 DIAGNOSIS — F418 Other specified anxiety disorders: Secondary | ICD-10-CM

## 2019-10-24 ENCOUNTER — Other Ambulatory Visit: Payer: Self-pay | Admitting: Internal Medicine

## 2019-10-24 DIAGNOSIS — E118 Type 2 diabetes mellitus with unspecified complications: Secondary | ICD-10-CM

## 2019-10-29 ENCOUNTER — Ambulatory Visit: Payer: Medicare Other | Admitting: Internal Medicine

## 2019-10-30 ENCOUNTER — Other Ambulatory Visit: Payer: Self-pay | Admitting: Internal Medicine

## 2019-11-12 ENCOUNTER — Other Ambulatory Visit: Payer: Self-pay | Admitting: *Deleted

## 2019-11-12 NOTE — Patient Outreach (Signed)
[facility]) Care Management  11/12/2019  JOANDRY SLAGTER [birth year] [phone]   RN Health Coach attempted follow up outreach call to patient.  Patient was unavailable. HIPPA compliance voicemail message left with return callback number.  Plan: RN will call patient again within 30 days.  [facility] [phone]

## 2019-11-13 NOTE — Patient Outreach (Signed)
[facility]) Care Management  [place]  11/12/2019  [name] 1961-07-03 [id number]  RN Health Coach received return telephone call from patient.  Hipaa compliance verified. Per patient fasting blood sugar is 111  this morning. Patient stated that he actually has not been watching his diet. Patient stated he mostly bakes his foods and occasionally fried foods. Patient stated that he does not drive but is able to get around to anywhere he needs to with the [place] bus. Patient stated he does still smoke. Patient has hx of lung ca and partial lung removal. Patient stated his appetite is good but he eats two meals a day breakfast and dinner. Patient checks his blood sugars 2-3 times a day and checks his blood pressure occasionally. He has some episodes of elevated heart rate. Patient has agreed to further outreach calls.    Encounter Medications:  Outpatient Encounter Medications as of 11/12/2019  Medication Sig  . ACCU-CHEK AVIVA PLUS test strip use to check blood sugar every morning  . acetaminophen (TYLENOL) 325 MG tablet Take 650 mg by mouth every 6 (six) hours as needed for headache.  . albuterol (PROAIR HFA) 108 (90 Base) MCG/ACT inhaler Inhale 2 puffs into the lungs every 4 (four) hours as needed for wheezing.  Marland Kitchen albuterol (PROVENTIL) (2.5 MG/3ML) 0.083% nebulizer solution inhale 3 milliliters (2.5 mg) by nebulization route every 6 hours as needed for 30 days  . ALPRAZolam (XANAX) 1 MG tablet Take 1 tablet (1 mg total) by mouth 3 (three) times daily as needed for anxiety.  [name] ELLIPTA 62.5-25 MCG/INH AEPB Inhale 1 puff into the lungs daily. (Patient taking differently: Inhale 1 puff into the lungs daily. )  . carvedilol (COREG) 12.5 MG tablet Take 1 tablet (12.5 mg total) by mouth 2 (two) times daily with a meal. Follow- appt due in June must see provider for future refills  . cetirizine (ZYRTEC) 10 MG tablet Take 10 mg by mouth daily.  Marland Kitchen dicyclomine (BENTYL)  20 MG tablet Take 1 tablet (20 mg total) by mouth 3 (three) times daily before meals.  Marland Kitchen esomeprazole (NEXIUM) 40 MG capsule Take 1 capsule (40 mg total) by mouth 2 (two) times daily before a meal.  . fluticasone (FLONASE) 50 MCG/ACT nasal spray Place 2 sprays into both nostrils daily.  . pravastatin (PRAVACHOL) 40 MG tablet Take 1 tablet (40 mg total) by mouth daily.  Marland Kitchen SYNJARDY 01-999 MG TABS Take 1 tablet by mouth 2 (two) times daily.  . traZODone (DESYREL) 100 MG tablet Take 3 tablets (300 mg total) by mouth at bedtime.  . TRUVADA 200-300 MG tablet Take 1 tablet by mouth daily.   No facility-administered encounter medications on file as of 11/12/2019.    Functional Status:  In your present state of health, do you have any difficulty performing the following activities: 11/12/2019 06/28/2019  Hearing? N N  Vision? N N  Difficulty concentrating or making decisions? N N  Walking or climbing stairs? N N  Dressing or bathing? N N  Doing errands, shopping? N N  Preparing Food and eating ? N N  Using the Toilet? N N  In the past six months, have you accidently leaked urine? N N  Do you have problems with loss of bowel control? N N  Managing your Medications? N N  Managing your Finances? N N  Housekeeping or managing your Housekeeping? N N  Some recent data might be hidden    Fall/Depression Screening: Fall  Risk  11/12/2019 06/28/2019 06/24/2019  Falls in the past year? 0 0 0  Number falls in past yr: 0 0 0  Injury with Fall? 0 0 0  Follow up Falls evaluation completed - Falls evaluation completed   PHQ 2/9 Scores 10/10/2019 06/28/2019 06/24/2019 04/08/2018 03/26/2018 10/03/2017 12/04/2016  PHQ - 2 Score 1 6 4  0 1 2 0  PHQ- 9 Score 11 11 7  - 9 14 1    THN CM Care Plan Problem One     Most Recent Value  Care Plan Problem One  Knowledge Deficit in Self Management of Diabetes  Role Documenting the Problem One  [place] for Problem One  Active  THN Long Term Goal   Patient will  see a decrease in A1C from 8.1 within the next 90 days  THN Long Term Goal Start Date  11/12/19  Interventions for Problem One Long Term Goal  RN discussed what the A1C is currently and how it realtes to his  blood sugars.RN discussed healthy eating and medication adherence will help decreasing his A1C. RN will follow upwith further discussion  THN CM Short Term Goal #1   Patient will monitor and document CBG and blood pressures within the next 30 days  THN CM Short Term Goal #1 Start Date  11/12/19  Interventions for Short Term Goal #1  RN discussed documenting CBG and monitoring blood pressure since he is having elevations. RN sent a 2021 Calendar book and patient is to document and take information to physician. RN will follow up with compliance.  THN CM Short Term Goal #2   Patient will verbalize receiving list of healthy snacks within the next 30 days  THN CM Short Term Goal #2 Start Date  11/12/19  Interventions for Short Term Goal #2  RN discussed eating healthy snacks. RN sent a list of low carb snacks. RN will follow up with further discussion within the next 30 days      Assessment:  FBS 111 A1C 8.1 Patient takes medications as prescribed Patient does not follow diet Patient is still currently smoking Patient will benefit from [place] telephonic outreach for education and support for diabetes self management.  Plan:  RN assisted patient in signing up on [facility] waiting list RN discussed smoking RN discussed alcohol consumption RN discussed A1C and blood sugars RN sent a 2021 calendar for documentation RN sent a list of low carb snacks RN discussed healthy eating RN sent living well with diabetes booklet RN will follow up outreach within the month of May. RN sent barrier letter and assessment to PCP  [place] [facility] [phone]

## 2019-11-18 DIAGNOSIS — J449 Chronic obstructive pulmonary disease, unspecified: Secondary | ICD-10-CM | POA: Diagnosis not present

## 2019-11-19 ENCOUNTER — Encounter: Payer: Self-pay | Admitting: Internal Medicine

## 2019-11-24 IMAGING — CT CT HEAD CODE STROKE
3 series · 16 of 47 positions shown, 19 images · non-contrast
Comparison: MRI head 10/26/2015

CLINICAL DATA: Code stroke.  Slurred speech and facial numbness

EXAM:
CT HEAD WITHOUT CONTRAST
TECHNIQUE: Contiguous axial images were obtained from the base of the skull
through the vertex without intravenous contrast.

[Series 3: head 5.0 h30s · axial · 0.49mm/px · z∈[-139,+1]mm · 10 of 34 slices shown, 13 images]
[im 3/34  brain]
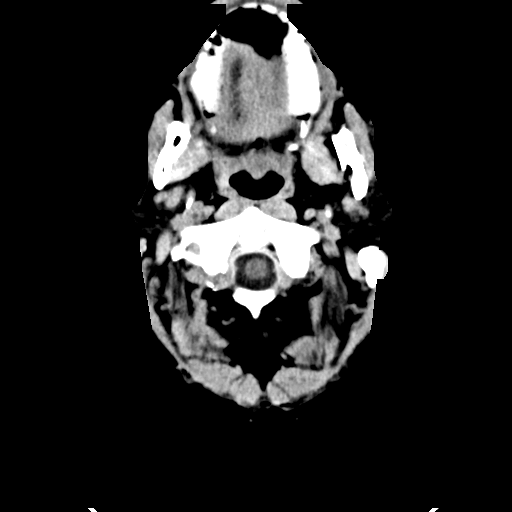
[im 3/34  bone]
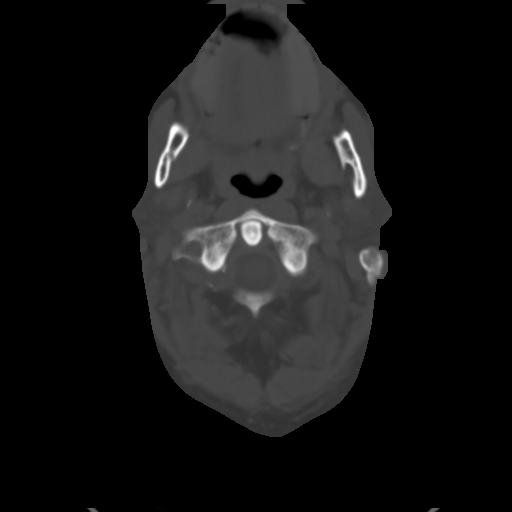
[im 6/34  brain]
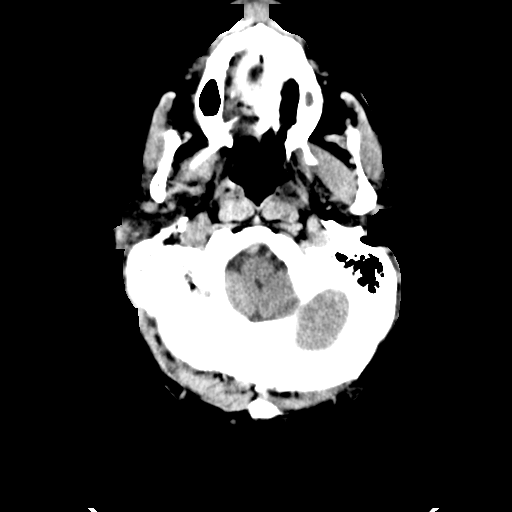
[im 10/34  brain]
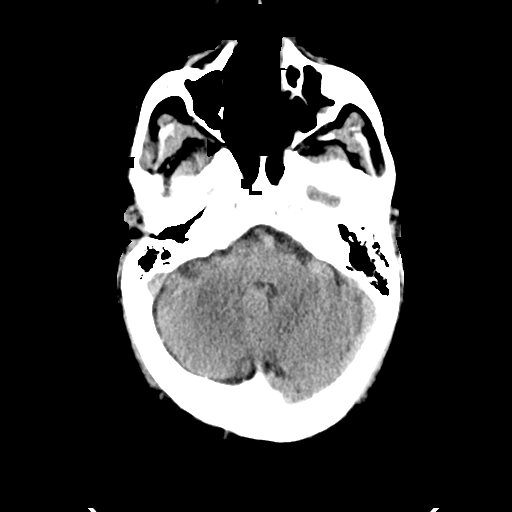
[im 12/34  brain]
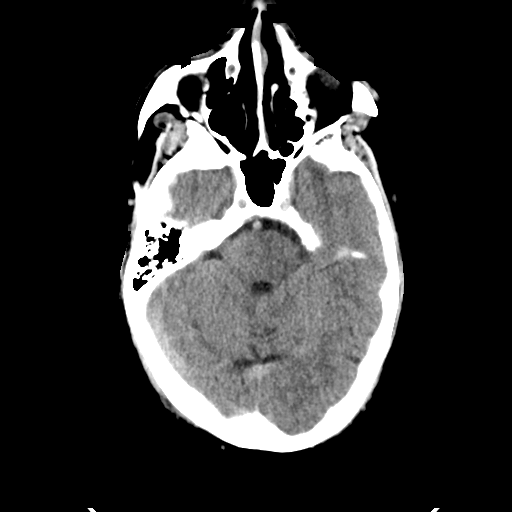
[im 15/34  brain]
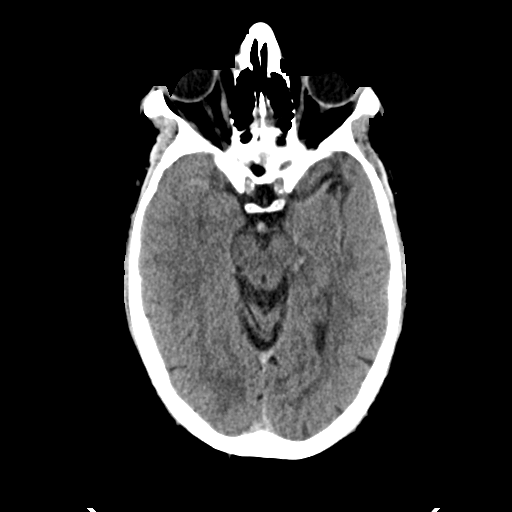
[im 15/34  bone]
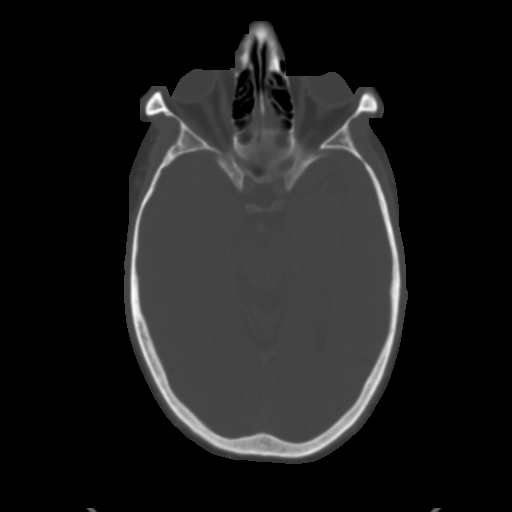
[im 19/34  brain]
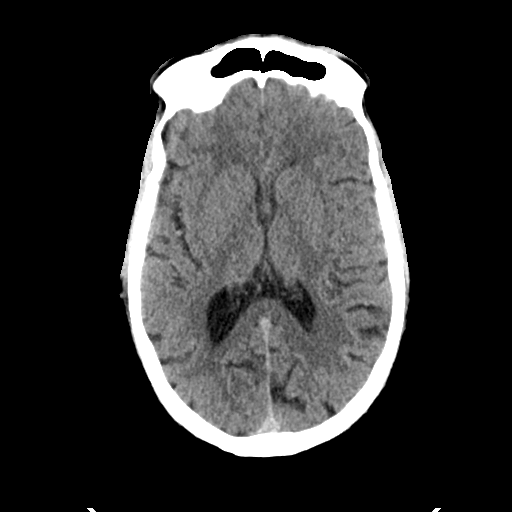
[im 22/34  brain]
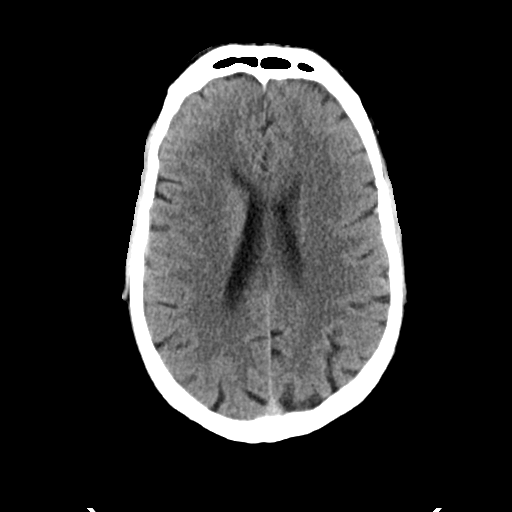
[im 26/34  brain]
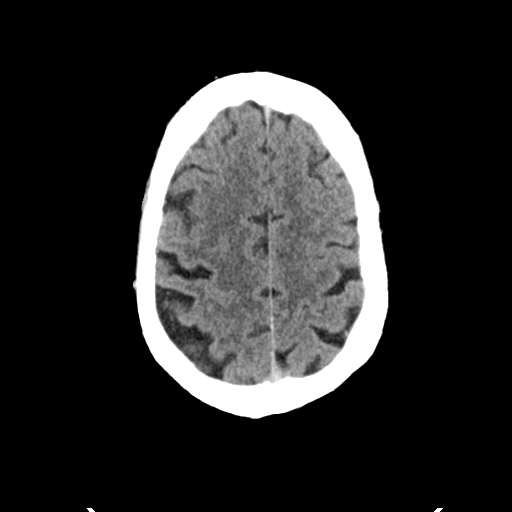
[im 28/34  brain]
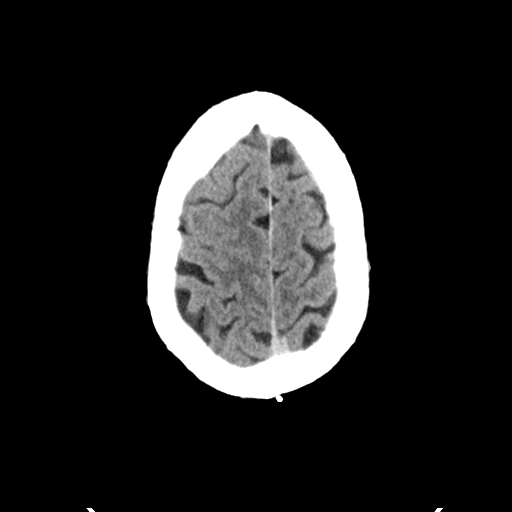
[im 28/34  bone]
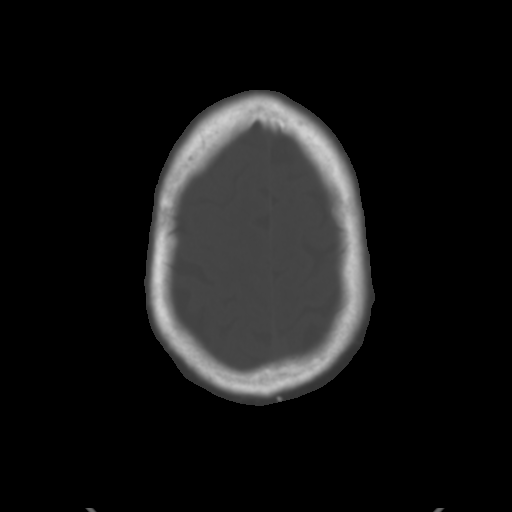
[im 31/34  brain]
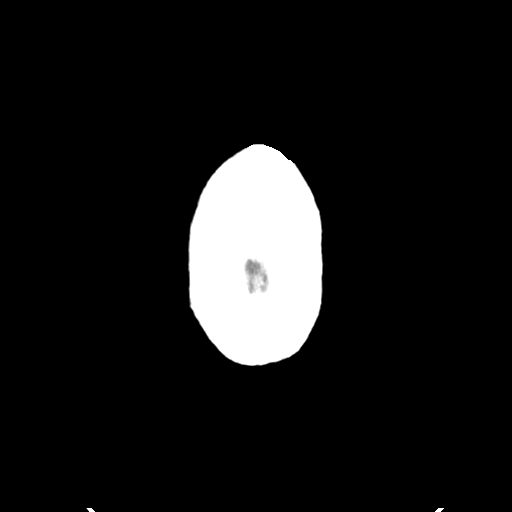

[Series 5: head 3.0 mpr cor · coronal · 0.32mm/px · 3 of 74 slices shown]
[im 25/74  brain]
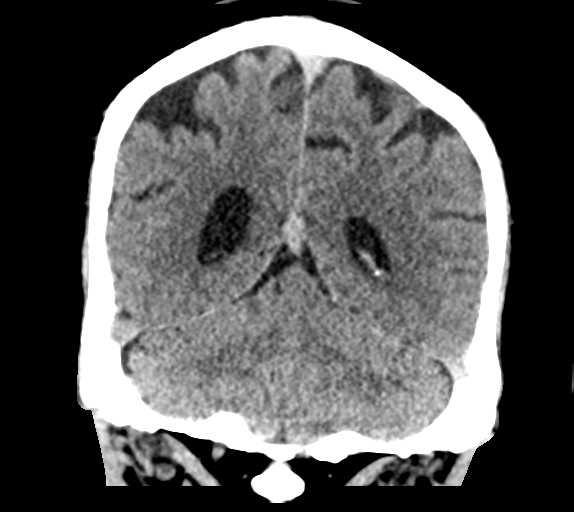
[im 33/74  brain]
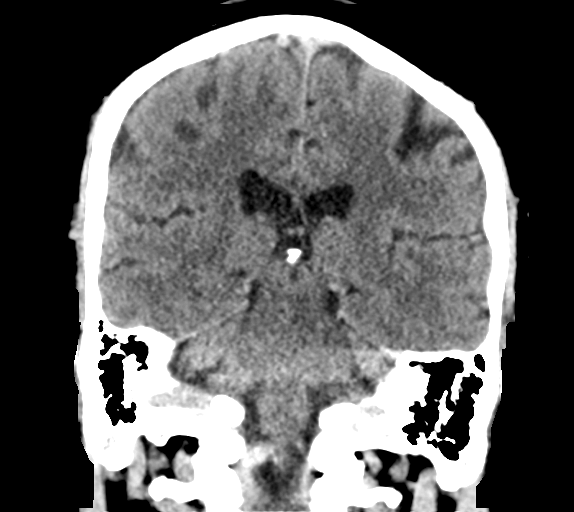
[im 41/74  brain]
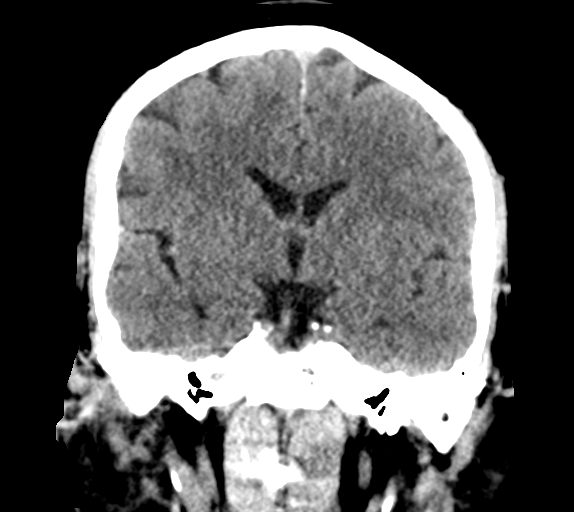

[Series 6: head 3.0 mpr sag · sagittal · 0.37mm/px · 3 of 56 slices shown]
[im 19/56  brain]
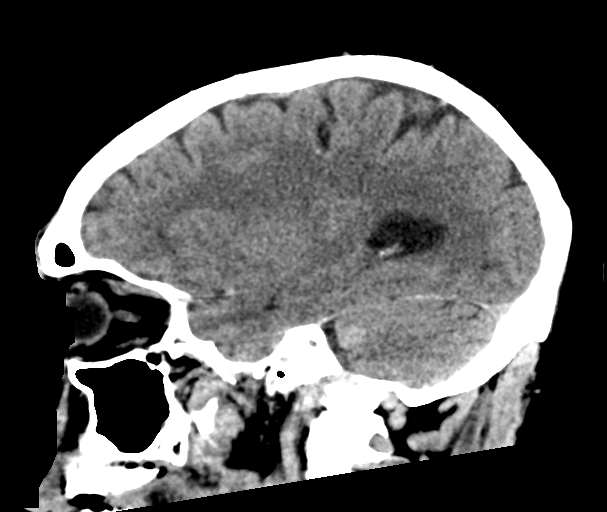
[im 28/56  brain]
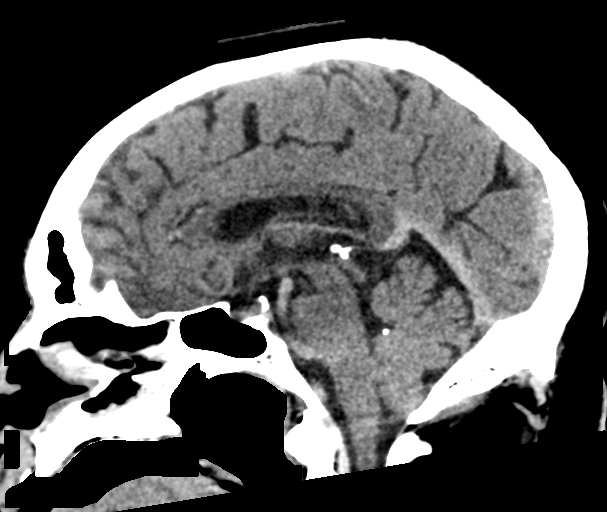
[im 37/56  brain]
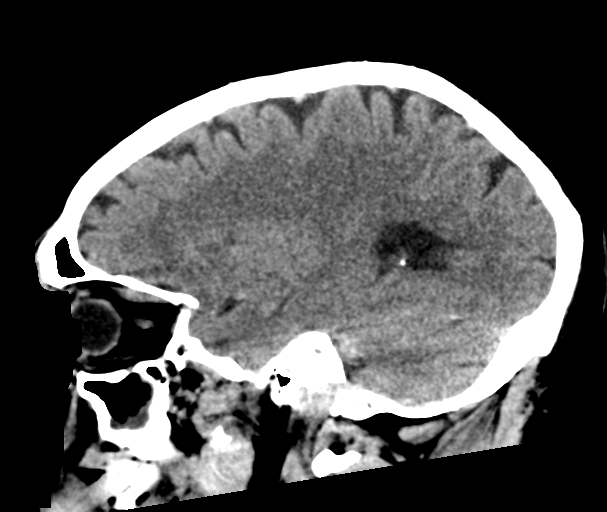

[16 of 47 positions shown; findings below may reference images not displayed]

FINDINGS: Brain: Mild atrophy. Negative for hydrocephalus. Negative for acute
infarct, hemorrhage, or mass lesion.

Vascular: Vessels of the base of the brain are diffusely hyperdense
likely due to high hematocrit or hemoconcentration.

Skull: Negative

Sinuses/Orbits: Mucosal edema paranasal sinuses.  Negative orbit.

Other: None

ASPECTS (Alberta Stroke Program Early CT Score)

- Ganglionic level infarction (caudate, lentiform nuclei, internal
capsule, insula, M1-M3 cortex): 7

- Supraganglionic infarction (M4-M6 cortex): 3

Total score (0-10 with 10 being normal): 10
IMPRESSION: 1. No acute intracranial abnormality
2. ASPECTS is 10
3. These results were called by telephone at the time of
interpretation on 09/04/2018 at [DATE] to Dr. Che Wah , who
verbally acknowledged these results.

## 2019-12-03 ENCOUNTER — Ambulatory Visit (HOSPITAL_COMMUNITY): Payer: Medicare Other

## 2019-12-03 ENCOUNTER — Other Ambulatory Visit: Payer: Self-pay

## 2019-12-04 ENCOUNTER — Encounter: Payer: Self-pay | Admitting: Internal Medicine

## 2019-12-04 ENCOUNTER — Other Ambulatory Visit: Payer: Self-pay | Admitting: Internal Medicine

## 2019-12-04 ENCOUNTER — Other Ambulatory Visit: Payer: Self-pay

## 2019-12-04 ENCOUNTER — Ambulatory Visit (INDEPENDENT_AMBULATORY_CARE_PROVIDER_SITE_OTHER): Payer: Medicare Other | Admitting: Internal Medicine

## 2019-12-04 VITALS — BP 138/80 | HR 91 | Temp 97.4°F | Resp 16 | Ht 75.0 in | Wt 188.0 lb

## 2019-12-04 DIAGNOSIS — R7989 Other specified abnormal findings of blood chemistry: Secondary | ICD-10-CM | POA: Diagnosis not present

## 2019-12-04 DIAGNOSIS — D696 Thrombocytopenia, unspecified: Secondary | ICD-10-CM | POA: Diagnosis not present

## 2019-12-04 DIAGNOSIS — J449 Chronic obstructive pulmonary disease, unspecified: Secondary | ICD-10-CM

## 2019-12-04 DIAGNOSIS — F418 Other specified anxiety disorders: Secondary | ICD-10-CM

## 2019-12-04 DIAGNOSIS — E785 Hyperlipidemia, unspecified: Secondary | ICD-10-CM

## 2019-12-04 DIAGNOSIS — G4730 Sleep apnea, unspecified: Secondary | ICD-10-CM

## 2019-12-04 DIAGNOSIS — E118 Type 2 diabetes mellitus with unspecified complications: Secondary | ICD-10-CM | POA: Diagnosis not present

## 2019-12-04 DIAGNOSIS — J301 Allergic rhinitis due to pollen: Secondary | ICD-10-CM

## 2019-12-04 DIAGNOSIS — L602 Onychogryphosis: Secondary | ICD-10-CM

## 2019-12-04 DIAGNOSIS — Z7252 High risk homosexual behavior: Secondary | ICD-10-CM

## 2019-12-04 DIAGNOSIS — R972 Elevated prostate specific antigen [PSA]: Secondary | ICD-10-CM | POA: Diagnosis not present

## 2019-12-04 DIAGNOSIS — G4700 Insomnia, unspecified: Secondary | ICD-10-CM

## 2019-12-04 DIAGNOSIS — Z111 Encounter for screening for respiratory tuberculosis: Secondary | ICD-10-CM

## 2019-12-04 DIAGNOSIS — I429 Cardiomyopathy, unspecified: Secondary | ICD-10-CM

## 2019-12-04 LAB — PSA: PSA: 0.63 ng/mL (ref 0.10–4.00)

## 2019-12-04 LAB — HEPATIC FUNCTION PANEL
ALT: 52 U/L (ref 0–53)
AST: 72 U/L — ABNORMAL HIGH (ref 0–37)
Albumin: 4.2 g/dL (ref 3.5–5.2)
Alkaline Phosphatase: 90 U/L (ref 39–117)
Bilirubin, Direct: 0.5 mg/dL — ABNORMAL HIGH (ref 0.0–0.3)
Total Bilirubin: 1.7 mg/dL — ABNORMAL HIGH (ref 0.2–1.2)
Total Protein: 7.7 g/dL (ref 6.0–8.3)

## 2019-12-04 LAB — CBC WITH DIFFERENTIAL/PLATELET
Basophils Absolute: 0 10*3/uL (ref 0.0–0.1)
Basophils Relative: 0.5 % (ref 0.0–3.0)
Eosinophils Absolute: 0 10*3/uL (ref 0.0–0.7)
Eosinophils Relative: 0.6 % (ref 0.0–5.0)
HCT: 53.8 % — ABNORMAL HIGH (ref 39.0–52.0)
Hemoglobin: 18.5 g/dL (ref 13.0–17.0)
Lymphocytes Relative: 27 % (ref 12.0–46.0)
Lymphs Abs: 1.8 10*3/uL (ref 0.7–4.0)
MCHC: 34.4 g/dL (ref 30.0–36.0)
MCV: 96.4 fl (ref 78.0–100.0)
Monocytes Absolute: 0.9 10*3/uL (ref 0.1–1.0)
Monocytes Relative: 12.7 % — ABNORMAL HIGH (ref 3.0–12.0)
Neutro Abs: 4 10*3/uL (ref 1.4–7.7)
Neutrophils Relative %: 59.2 % (ref 43.0–77.0)
Platelets: 122 10*3/uL — ABNORMAL LOW (ref 150.0–400.0)
RBC: 5.58 Mil/uL (ref 4.22–5.81)
RDW: 14.2 % (ref 11.5–15.5)
WBC: 6.8 10*3/uL (ref 4.0–10.5)

## 2019-12-04 LAB — HEMOGLOBIN A1C: Hgb A1c MFr Bld: 6 % (ref 4.6–6.5)

## 2019-12-04 LAB — URINALYSIS, ROUTINE W REFLEX MICROSCOPIC
Ketones, ur: NEGATIVE
Leukocytes,Ua: NEGATIVE
Nitrite: NEGATIVE
RBC / HPF: NONE SEEN (ref 0–?)
Specific Gravity, Urine: 1.01 (ref 1.000–1.030)
Total Protein, Urine: 30 — AB
Urine Glucose: >=1000 — AB
Urobilinogen, UA: 2 — AB (ref 0.0–1.0)
pH: 5.5 (ref 5.0–8.0)

## 2019-12-04 LAB — FOLATE: Folate: 14.7 ng/mL (ref >5.9)

## 2019-12-04 LAB — PROTIME-INR
INR: 1 ratio (ref 0.8–1.0)
Prothrombin Time: 11.3 s (ref 9.6–13.1)

## 2019-12-04 LAB — VITAMIN B12: Vitamin B-12: 325 pg/mL (ref 211–911)

## 2019-12-04 MED ORDER — TRAZODONE HCL 100 MG PO TABS: 300.0000 mg | ORAL_TABLET | Freq: Every day | ORAL | 1 refills | Status: DC

## 2019-12-04 MED ORDER — CETIRIZINE HCL 10 MG PO TABS: 10.0000 mg | ORAL_TABLET | Freq: Every day | ORAL | 1 refills | Status: DC

## 2019-12-04 MED ORDER — ANORO ELLIPTA 62.5-25 MCG/INH IN AEPB: INHALATION_SPRAY | RESPIRATORY_TRACT | 1 refills | Status: DC

## 2019-12-04 NOTE — Progress Notes (Signed)
Subjective:  Patient ID: [name], male    DOB: 1960/10/22  Age: 59 y.o. MRN: [id number]  CC: Hyperlipidemia, Diabetes, and COPD  This visit occurred during the SARS-CoV-2 public health emergency.  Safety protocols were in place, including screening questions prior to the visit, additional usage of staff PPE, and extensive cleaning of exam room while observing appropriate contact time as indicated for disinfecting solutions.    HPI [name] presents for f/up - He continues to complain of shortness of breath and dyspnea on exertion.  He is not very active but denies any recent episodes of chest pain or diaphoresis.  He continues to drink alcohol and smoke cigarettes.  He has a history of elevated PSA and was previously referred to urology but he did not keep the appointment.  He tells me his blood sugars have been well controlled and he denies polys.  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  Outpatient Medications Prior to Visit  Medication Sig Dispense Refill  . ACCU-CHEK AVIVA PLUS test strip use to check blood sugar every morning    . acetaminophen (TYLENOL) 325 MG tablet Take 650 mg by mouth every 6 (six) hours as needed for headache.    . albuterol (PROAIR HFA) 108 (90 Base) MCG/ACT inhaler Inhale 2 puffs into the lungs every 4 (four) hours as needed for wheezing. 3 Inhaler 3  . albuterol (PROVENTIL) (2.5 MG/3ML) 0.083% nebulizer solution inhale 3 milliliters (2.5 mg) by nebulization route every 6 hours as needed for 30 days    . carvedilol (COREG) 12.5 MG tablet Take 1 tablet (12.5 mg total) by mouth 2 (two) times daily with a meal. Follow- appt due in June must see provider for future refills 180 tablet 1  . dicyclomine (BENTYL) 20 MG tablet Take 1 tablet (20 mg total) by mouth 3 (three) times daily before meals. 270 tablet 0  . esomeprazole (NEXIUM) 40 MG capsule Take 1 capsule (40 mg total)  by mouth 2 (two) times daily before a meal. 90 capsule 1  . fluticasone (FLONASE) 50 MCG/ACT nasal spray Place 2 sprays into both nostrils daily. 48 g 1  . pravastatin (PRAVACHOL) 40 MG tablet Take 1 tablet (40 mg total) by mouth daily. 90 tablet 1  . SYNJARDY 01-999 MG TABS Take 1 tablet by mouth 2 (two) times daily. 180 tablet 0  . TRUVADA 200-300 MG tablet Take 1 tablet by mouth daily.    Marland Kitchen ALPRAZolam (XANAX) 1 MG tablet Take 1 tablet (1 mg total) by mouth 3 (three) times daily as needed for anxiety. 90 tablet 3  . ANORO ELLIPTA 62.5-25 MCG/INH AEPB Inhale 1 puff into the lungs daily. (Patient taking differently: Inhale 1 puff into the lungs daily. ) 90 each 1  . cetirizine (ZYRTEC) 10 MG tablet Take 10 mg by mouth daily.    . traZODone (DESYREL) 100 MG tablet Take 3 tablets (300 mg total) by mouth at bedtime. 270 tablet 1   No facility-administered medications prior to visit.    ROS Review of Systems  Constitutional: Negative for chills, diaphoresis, fatigue and fever.  HENT: Negative.  Negative for sore throat.   Eyes: Negative.   Respiratory: Positive for shortness of breath. Negative for cough, chest tightness and wheezing.   Cardiovascular: Negative for chest pain, palpitations and leg swelling.  Gastrointestinal: Negative for abdominal pain, constipation, diarrhea, nausea and vomiting.  Endocrine:  Negative.  Negative for polydipsia, polyphagia and polyuria.  Genitourinary: Negative.  Negative for difficulty urinating.  Musculoskeletal: Negative.  Negative for arthralgias and myalgias.  Skin: Negative.  Negative for color change and pallor.  Neurological: Negative.  Negative for dizziness, weakness, light-headedness and headaches.  Hematological: Negative for adenopathy. Does not bruise/bleed easily.  Psychiatric/Behavioral: Positive for sleep disturbance. Negative for behavioral problems, confusion, decreased concentration, dysphoric mood and suicidal ideas. The patient is  nervous/anxious.     Objective:  BP 138/80 (BP Location: Left Arm, Patient Position: Sitting, Cuff Size: Large)   Pulse 91   Temp (!) 97.4 F (36.3 C) (Oral)   Resp 16   Ht 6\' 3"  (1.905 m)   Wt 188 lb (85.3 kg)   SpO2 96%   BMI 23.50 kg/m   BP Readings from Last 3 Encounters:  12/04/19 138/80  10/14/19 117/75  10/10/19 124/80    Wt Readings from Last 3 Encounters:  12/04/19 188 lb (85.3 kg)  10/14/19 195 lb (88.5 kg)  10/10/19 195 lb 8 oz (88.7 kg)    Physical Exam  Lab Results  Component Value Date   WBC 6.8 12/04/2019   HGB 18.5 Repeated and verified X2. (HH) 12/04/2019   HCT 53.8 (H) 12/04/2019   PLT 122.0 (L) 12/04/2019   GLUCOSE 100 (H) 10/14/2019   CHOL 181 06/26/2019   TRIG 155.0 (H) 06/26/2019   HDL 33.90 (L) 06/26/2019   LDLDIRECT 122.0 11/30/2016   LDLCALC 116 (H) 06/26/2019   ALT 52 12/04/2019   AST 72 (H) 12/04/2019   NA 138 10/14/2019   K 4.4 10/14/2019   CL 102 10/14/2019   CREATININE 0.88 10/14/2019   BUN <5 (L) 10/14/2019   CO2 24 10/14/2019   TSH 0.90 06/26/2019   PSA 0.63 12/04/2019   INR 1.0 12/04/2019   HGBA1C 6.0 12/04/2019   MICROALBUR 11.8 (H) 12/04/2019    DG Chest 2 View  Result Date: 10/14/2019 CLINICAL DATA:  Left flank pain. EXAM: CHEST - 2 VIEW COMPARISON:  October 03, 2019 FINDINGS: Stable changes in the right base consistent previous surgery. The heart, hila, mediastinum, lungs, and pleura are otherwise unchanged and unremarkable. IMPRESSION: Stable postoperative changes on the right. No other acute abnormalities. Electronically Signed   By: Dorise Bullion III M.D   On: 10/14/2019 12:58    Assessment & Plan:   [name] was seen today for hyperlipidemia, diabetes and copd.  Diagnoses and all orders for this visit:  Type II diabetes mellitus with manifestations ([place])- His blood sugars are adequately well controlled. -     Hemoglobin A1c -     Urinalysis, Routine w reflex microscopic -     Microalbumin / creatinine urine  ratio -     Ambulatory referral to Ophthalmology -     HM Diabetes Foot Exam  Elevated LFTs- His LFTs remain elevated.  There is likely a component of alcohol exposure.  I will screen for hepatitis B infection. -     Hepatitis B surface antigen -     Hepatic function panel -     Protime-INR  Elevated PSA- His PSA is normal now. -     PSA -     Cancel: Ambulatory referral to Urology  Hyperlipidemia with target LDL less than 130  Thrombocytopenia ([place])- His platelet count is low at 122.  This is unlikely related to the alcohol intake.  I will screen for B12, folate, and thiamine deficiencies. -     CBC with Differential/Platelet -  Vitamin B1 -     Folate -     Vitamin B12  Long toenail -     Ambulatory referral to Podiatry  Screening-pulmonary TB -     QuantiFERON-TB Gold Plus  Seasonal allergic rhinitis due to pollen -     cetirizine (ZYRTEC) 10 MG tablet; Take 1 tablet (10 mg total) by mouth daily.  Insomnia w/ sleep apnea -     traZODone (DESYREL) 100 MG tablet; Take 3 tablets (300 mg total) by mouth at bedtime.  Depression with anxiety -     traZODone (DESYREL) 100 MG tablet; Take 3 tablets (300 mg total) by mouth at bedtime.  COPD GOLD 0 copd  -     umeclidinium-vilanterol (ANORO ELLIPTA) 62.5-25 MCG/INH AEPB; Inhale 1 puff into the lungs daily.  Secondary cardiomyopathy ([place])- I have asked him to have a follow-up with cardiology to see if this is causing the shortness of breath. -     Ambulatory referral to Cardiology   I have discontinued Marcelles T. Mcclune's ALPRAZolam. I have also changed his cetirizine and Anoro Ellipta. Additionally, I am having him maintain his carvedilol, albuterol, fluticasone, acetaminophen, esomeprazole, pravastatin, albuterol, Truvada, Accu-Chek Aviva Plus, dicyclomine, Synjardy, and traZODone.  Meds ordered this encounter  Medications  . cetirizine (ZYRTEC) 10 MG tablet    Sig: Take 1 tablet (10 mg total) by mouth daily.    Dispense:   90 tablet    Refill:  1  . traZODone (DESYREL) 100 MG tablet    Sig: Take 3 tablets (300 mg total) by mouth at bedtime.    Dispense:  270 tablet    Refill:  1  . umeclidinium-vilanterol (ANORO ELLIPTA) 62.5-25 MCG/INH AEPB    Sig: Inhale 1 puff into the lungs daily.    Dispense:  90 each    Refill:  1     Follow-up: Return in about 4 months (around 04/04/2020).  Scarlette Calico, MD

## 2019-12-04 NOTE — Patient Instructions (Signed)
Type 2 Diabetes Mellitus, Diagnosis, Adult Type 2 diabetes (type 2 diabetes mellitus) is a long-term (chronic) disease. In type 2 diabetes, one or both of these problems may be present:  The pancreas does not make enough of a hormone called insulin.  Cells in the body do not respond properly to insulin that the body makes (insulin resistance). Normally, insulin allows blood sugar (glucose) to enter cells in the body. The cells use glucose for energy. Insulin resistance or lack of insulin causes excess glucose to build up in the blood instead of going into cells. As a result, high blood glucose (hyperglycemia) develops. What increases the risk? The following factors may make you more likely to develop type 2 diabetes:  Having a family member with type 2 diabetes.  Being overweight or obese.  Having an inactive (sedentary) lifestyle.  Having been diagnosed with insulin resistance.  Having a history of prediabetes, gestational diabetes, or polycystic ovary syndrome (PCOS).  Being of American-Indian, African-American, Hispanic/Latino, or Asian/Pacific Islander descent. What are the signs or symptoms? In the early stage of this condition, you may not have symptoms. Symptoms develop slowly and may include:  Increased thirst (polydipsia).  Increased hunger(polyphagia).  Increased urination (polyuria).  Increased urination during the night (nocturia).  Unexplained weight loss.  Frequent infections that keep coming back (recurring).  Fatigue.  Weakness.  Vision changes, such as blurry vision.  Cuts or bruises that are slow to heal.  Tingling or numbness in the hands or feet.  Dark patches on the skin (acanthosis nigricans). How is this diagnosed? This condition is diagnosed based on your symptoms, your medical history, a physical exam, and your blood glucose level. Your blood glucose may be checked with one or more of the following blood tests:  A fasting blood glucose (FBG)  test. You will not be allowed to eat (you will fast) for 8 hours or longer before a blood sample is taken.  A random blood glucose test. This test checks blood glucose at any time of day regardless of when you ate.  An A1c (hemoglobin A1c) blood test. This test provides information about blood glucose control over the previous 2-3 months.  An oral glucose tolerance test (OGTT). This test measures your blood glucose at two times: ? After fasting. This is your baseline blood glucose level. ? Two hours after drinking a beverage that contains glucose. You may be diagnosed with type 2 diabetes if:  Your FBG level is 126 mg/dL (7.0 mmol/L) or higher.  Your random blood glucose level is 200 mg/dL (11.1 mmol/L) or higher.  Your A1c level is 6.5% or higher.  Your OGTT result is higher than 200 mg/dL (11.1 mmol/L). These blood tests may be repeated to confirm your diagnosis. How is this treated? Your treatment may be managed by a specialist called an endocrinologist. Type 2 diabetes may be treated by following instructions from your health care provider about:  Making diet and lifestyle changes. This may include: ? Following an individualized nutrition plan that is developed by a diet and nutrition specialist (registered dietitian). ? Exercising regularly. ? Finding ways to manage stress.  Checking your blood glucose level as often as told.  Taking diabetes medicines or insulin daily. This helps to keep your blood glucose levels in the healthy range. ? If you use insulin, you may need to adjust the dosage depending on how physically active you are and what foods you eat. Your health care provider will tell you how to adjust your dosage.    Taking medicines to help prevent complications from diabetes, such as: ? Aspirin. ? Medicine to lower cholesterol. ? Medicine to control blood pressure. Your health care provider will set individualized treatment goals for you. Your goals will be based on  your age, other medical conditions you have, and how you respond to diabetes treatment. Generally, the goal of treatment is to maintain the following blood glucose levels:  Before meals (preprandial): 80-130 mg/dL (4.4-7.2 mmol/L).  After meals (postprandial): below 180 mg/dL (10 mmol/L).  A1c level: less than 7%. Follow these instructions at home: Questions to ask your health care provider  Consider asking the following questions: ? Do I need to meet with a diabetes educator? ? Where can I find a support group for people with diabetes? ? What equipment will I need to manage my diabetes at home? ? What diabetes medicines do I need, and when should I take them? ? How often do I need to check my blood glucose? ? What number can I call if I have questions? ? When is my next appointment? General instructions  Take over-the-counter and prescription medicines only as told by your health care provider.  Keep all follow-up visits as told by your health care provider. This is important.  For more information about diabetes, visit: ? American Diabetes Association (ADA): www.diabetes.org ? American Association of Diabetes Educators (AADE): www.diabeteseducator.org Contact a health care provider if:  Your blood glucose is at or above 240 mg/dL (13.3 mmol/L) for 2 days in a row.  You have been sick or have had a fever for 2 days or longer, and you are not getting better.  You have any of the following problems for more than 6 hours: ? You cannot eat or drink. ? You have nausea and vomiting. ? You have diarrhea. Get help right away if:  Your blood glucose is lower than 54 mg/dL (3.0 mmol/L).  You become confused or you have trouble thinking clearly.  You have difficulty breathing.  You have moderate or large ketone levels in your urine. Summary  Type 2 diabetes (type 2 diabetes mellitus) is a long-term (chronic) disease. In type 2 diabetes, the pancreas does not make enough of a  hormone called insulin, or cells in the body do not respond properly to insulin that the body makes (insulin resistance).  This condition is treated by making diet and lifestyle changes and taking diabetes medicines or insulin.  Your health care provider will set individualized treatment goals for you. Your goals will be based on your age, other medical conditions you have, and how you respond to diabetes treatment.  Keep all follow-up visits as told by your health care provider. This is important. This information is not intended to replace advice given to you by your health care provider. Make sure you discuss any questions you have with your health care provider. Document Revised: 11/10/2017 Document Reviewed: 10/16/2015 Elsevier Patient Education  2020 Elsevier Inc.  

## 2019-12-05 ENCOUNTER — Ambulatory Visit: Payer: Self-pay | Admitting: Internal Medicine

## 2019-12-05 ENCOUNTER — Ambulatory Visit: Payer: Medicare Other | Attending: Internal Medicine

## 2019-12-05 DIAGNOSIS — Z23 Encounter for immunization: Secondary | ICD-10-CM

## 2019-12-05 LAB — MICROALBUMIN / CREATININE URINE RATIO
Creatinine,U: 139 mg/dL
Microalb Creat Ratio: 8.5 mg/g (ref 0.0–30.0)
Microalb, Ur: 11.8 mg/dL — ABNORMAL HIGH (ref 0.0–1.9)

## 2019-12-05 NOTE — Progress Notes (Signed)
   Cov[facility] Vaccination [facility]  Name:  [name]    MRN: [id number] DOB: [birth year]  12/05/2019  [name] was observed post Covid-19 immunization for 15 minutes without incident. He was provided with Vaccine Information Sheet and instruction to access the V-Safe system.   [name] was instructed to call 911 with any severe reactions post vaccine: Marland Kitchen Difficulty breathing  . Swelling of face and throat  . A fast heartbeat  . A bad rash all over body  . Dizziness and weakness   Immunizations Administered    Name Date Dose VIS Date Route   Pfizer COVID-19 Vaccine 12/05/2019 10:40 AM 0.3 mL 09/06/2019 Intramuscular   Manufacturer: [place]   Lot: [id number]   [place]: [id number]

## 2019-12-10 ENCOUNTER — Encounter: Payer: Self-pay | Admitting: Internal Medicine

## 2019-12-10 ENCOUNTER — Other Ambulatory Visit: Payer: Self-pay | Admitting: Internal Medicine

## 2019-12-10 DIAGNOSIS — E519 Thiamine deficiency, unspecified: Secondary | ICD-10-CM | POA: Insufficient documentation

## 2019-12-10 LAB — QUANTIFERON-TB GOLD PLUS
Mitogen-NIL: 8.61 IU/mL
NIL: 0.13 IU/mL
QuantiFERON-TB Gold Plus: NEGATIVE
TB1-NIL: <0 IU/mL
TB2-NIL: <0 IU/mL

## 2019-12-10 LAB — VITAMIN B1: Vitamin B1 (Thiamine): <6 nmol/L — ABNORMAL LOW (ref 8–30)

## 2019-12-10 LAB — HEPATITIS B SURFACE ANTIGEN: Hepatitis B Surface Ag: NONREACTIVE

## 2019-12-10 MED ORDER — TRUVADA 200-300 MG PO TABS: 1.0000 | ORAL_TABLET | Freq: Every day | ORAL | 0 refills | Status: DC

## 2019-12-10 MED ORDER — THIAMINE HCL 100 MG PO TABS: 100.0000 mg | ORAL_TABLET | ORAL | 1 refills | Status: DC

## 2019-12-10 NOTE — Addendum Note (Signed)
Addended by: [name] on: 12/10/2019 05:33 PM   Modules accepted: Orders

## 2019-12-11 ENCOUNTER — Other Ambulatory Visit: Payer: Self-pay | Admitting: Internal Medicine

## 2019-12-12 ENCOUNTER — Inpatient Hospital Stay: Payer: Medicare Other

## 2019-12-12 ENCOUNTER — Telehealth: Payer: Self-pay | Admitting: Medical Oncology

## 2019-12-12 ENCOUNTER — Ambulatory Visit (HOSPITAL_COMMUNITY): Admission: RE | Admit: 2019-12-12 | Payer: Medicare Other | Source: Ambulatory Visit

## 2019-12-12 NOTE — Telephone Encounter (Signed)
LVM to call re r/s appts for ct and f/u.  Appt cancelled.

## 2019-12-16 ENCOUNTER — Ambulatory Visit: Payer: Medicare Other | Admitting: Internal Medicine

## 2019-12-16 DIAGNOSIS — J449 Chronic obstructive pulmonary disease, unspecified: Secondary | ICD-10-CM | POA: Diagnosis not present

## 2019-12-25 ENCOUNTER — Encounter: Payer: Self-pay | Admitting: Internal Medicine

## 2019-12-25 DIAGNOSIS — R269 Unspecified abnormalities of gait and mobility: Secondary | ICD-10-CM | POA: Diagnosis not present

## 2019-12-25 DIAGNOSIS — J449 Chronic obstructive pulmonary disease, unspecified: Secondary | ICD-10-CM | POA: Diagnosis not present

## 2019-12-25 DIAGNOSIS — G4733 Obstructive sleep apnea (adult) (pediatric): Secondary | ICD-10-CM | POA: Diagnosis not present

## 2019-12-25 DIAGNOSIS — E118 Type 2 diabetes mellitus with unspecified complications: Secondary | ICD-10-CM

## 2020-01-01 ENCOUNTER — Ambulatory Visit: Payer: Medicare Other

## 2020-01-02 ENCOUNTER — Encounter: Payer: Self-pay | Admitting: Internal Medicine

## 2020-01-02 ENCOUNTER — Telehealth: Payer: Self-pay | Admitting: Internal Medicine

## 2020-01-02 NOTE — Telephone Encounter (Signed)
R/s appt per pt request - left message with appt date and time

## 2020-01-08 ENCOUNTER — Inpatient Hospital Stay: Payer: Medicare Other | Attending: Internal Medicine

## 2020-01-08 ENCOUNTER — Other Ambulatory Visit: Payer: Self-pay

## 2020-01-08 DIAGNOSIS — Z79899 Other long term (current) drug therapy: Secondary | ICD-10-CM | POA: Diagnosis not present

## 2020-01-08 DIAGNOSIS — E119 Type 2 diabetes mellitus without complications: Secondary | ICD-10-CM | POA: Diagnosis not present

## 2020-01-08 DIAGNOSIS — J449 Chronic obstructive pulmonary disease, unspecified: Secondary | ICD-10-CM | POA: Diagnosis not present

## 2020-01-08 DIAGNOSIS — C3411 Malignant neoplasm of upper lobe, right bronchus or lung: Secondary | ICD-10-CM | POA: Insufficient documentation

## 2020-01-08 DIAGNOSIS — I2510 Atherosclerotic heart disease of native coronary artery without angina pectoris: Secondary | ICD-10-CM | POA: Diagnosis not present

## 2020-01-08 DIAGNOSIS — I110 Hypertensive heart disease with heart failure: Secondary | ICD-10-CM | POA: Insufficient documentation

## 2020-01-08 DIAGNOSIS — I429 Cardiomyopathy, unspecified: Secondary | ICD-10-CM | POA: Insufficient documentation

## 2020-01-08 DIAGNOSIS — I700 Atherosclerosis of aorta: Secondary | ICD-10-CM | POA: Diagnosis not present

## 2020-01-08 DIAGNOSIS — R748 Abnormal levels of other serum enzymes: Secondary | ICD-10-CM | POA: Insufficient documentation

## 2020-01-08 DIAGNOSIS — C3490 Malignant neoplasm of unspecified part of unspecified bronchus or lung: Secondary | ICD-10-CM

## 2020-01-08 LAB — CMP (CANCER CENTER ONLY)
ALT: 105 U/L — ABNORMAL HIGH (ref 0–44)
AST: 163 U/L — ABNORMAL HIGH (ref 15–41)
Albumin: 3.7 g/dL (ref 3.5–5.0)
Alkaline Phosphatase: 74 U/L (ref 38–126)
Anion gap: 13 (ref 5–15)
BUN: <4 mg/dL — ABNORMAL LOW (ref 6–20)
CO2: 22 mmol/L (ref 22–32)
Calcium: 9.1 mg/dL (ref 8.9–10.3)
Chloride: 106 mmol/L (ref 98–111)
Creatinine: 0.76 mg/dL (ref 0.61–1.24)
GFR, Est AFR Am: >60 mL/min (ref >60)
GFR, Estimated: >60 mL/min (ref >60)
Glucose, Bld: 108 mg/dL — ABNORMAL HIGH (ref 70–99)
Potassium: 4.2 mmol/L (ref 3.5–5.1)
Sodium: 141 mmol/L (ref 135–145)
Total Bilirubin: 1.1 mg/dL (ref 0.3–1.2)
Total Protein: 7.2 g/dL (ref 6.5–8.1)

## 2020-01-08 LAB — CBC WITH DIFFERENTIAL (CANCER CENTER ONLY)
Abs Immature Granulocytes: 0.05 10*3/uL (ref 0.00–0.07)
Basophils Absolute: 0 10*3/uL (ref 0.0–0.1)
Basophils Relative: 1 %
Eosinophils Absolute: 0 10*3/uL (ref 0.0–0.5)
Eosinophils Relative: 1 %
HCT: 49 % (ref 39.0–52.0)
Hemoglobin: 16.8 g/dL (ref 13.0–17.0)
Immature Granulocytes: 1 %
Lymphocytes Relative: 32 %
Lymphs Abs: 2.1 10*3/uL (ref 0.7–4.0)
MCH: 32.4 pg (ref 26.0–34.0)
MCHC: 34.3 g/dL (ref 30.0–36.0)
MCV: 94.4 fL (ref 80.0–100.0)
Monocytes Absolute: 0.7 10*3/uL (ref 0.1–1.0)
Monocytes Relative: 10 %
Neutro Abs: 3.5 10*3/uL (ref 1.7–7.7)
Neutrophils Relative %: 55 %
Platelet Count: 105 10*3/uL — ABNORMAL LOW (ref 150–400)
RBC: 5.19 MIL/uL (ref 4.22–5.81)
RDW: 14.6 % (ref 11.5–15.5)
WBC Count: 6.4 10*3/uL (ref 4.0–10.5)
nRBC: 0 % (ref 0.0–0.2)

## 2020-01-08 LAB — HM DIABETES EYE EXAM

## 2020-01-09 ENCOUNTER — Ambulatory Visit: Payer: Medicare Other | Admitting: Cardiology

## 2020-01-09 ENCOUNTER — Encounter: Payer: Self-pay | Admitting: Internal Medicine

## 2020-01-09 ENCOUNTER — Ambulatory Visit (HOSPITAL_COMMUNITY)
Admission: RE | Admit: 2020-01-09 | Discharge: 2020-01-09 | Disposition: A | Discharge status: Home / Self Care | Payer: Medicare Other | Source: Ambulatory Visit | Attending: Internal Medicine | Admitting: Internal Medicine

## 2020-01-09 DIAGNOSIS — C3490 Malignant neoplasm of unspecified part of unspecified bronchus or lung: Secondary | ICD-10-CM | POA: Diagnosis not present

## 2020-01-09 MED ORDER — SODIUM CHLORIDE (PF) 0.9 % IJ SOLN
INTRAMUSCULAR | Status: AC
  Filled 2020-01-09: qty 50

## 2020-01-09 MED ORDER — IOHEXOL 300 MG/ML  SOLN
75.0000 mL | Freq: Once | INTRAMUSCULAR | Status: AC | PRN
  Administered 2020-01-09: 75 mL via INTRAVENOUS

## 2020-01-13 ENCOUNTER — Ambulatory Visit: Payer: Medicare Other | Admitting: Internal Medicine

## 2020-01-13 ENCOUNTER — Other Ambulatory Visit: Payer: Self-pay | Admitting: Internal Medicine

## 2020-01-13 DIAGNOSIS — K580 Irritable bowel syndrome with diarrhea: Secondary | ICD-10-CM

## 2020-01-15 ENCOUNTER — Inpatient Hospital Stay (HOSPITAL_BASED_OUTPATIENT_CLINIC_OR_DEPARTMENT_OTHER): Payer: Medicare Other | Admitting: Internal Medicine

## 2020-01-15 ENCOUNTER — Other Ambulatory Visit: Payer: Self-pay

## 2020-01-15 ENCOUNTER — Encounter: Payer: Self-pay | Admitting: Internal Medicine

## 2020-01-15 VITALS — BP 118/79 | HR 87 | Temp 98.5°F | Resp 18 | Ht 75.0 in | Wt 187.1 lb

## 2020-01-15 DIAGNOSIS — I2510 Atherosclerotic heart disease of native coronary artery without angina pectoris: Secondary | ICD-10-CM | POA: Diagnosis not present

## 2020-01-15 DIAGNOSIS — C3490 Malignant neoplasm of unspecified part of unspecified bronchus or lung: Secondary | ICD-10-CM

## 2020-01-15 DIAGNOSIS — R748 Abnormal levels of other serum enzymes: Secondary | ICD-10-CM | POA: Diagnosis not present

## 2020-01-15 DIAGNOSIS — I110 Hypertensive heart disease with heart failure: Secondary | ICD-10-CM | POA: Diagnosis not present

## 2020-01-15 DIAGNOSIS — I429 Cardiomyopathy, unspecified: Secondary | ICD-10-CM | POA: Diagnosis not present

## 2020-01-15 DIAGNOSIS — C3411 Malignant neoplasm of upper lobe, right bronchus or lung: Secondary | ICD-10-CM | POA: Diagnosis not present

## 2020-01-15 DIAGNOSIS — C3491 Malignant neoplasm of unspecified part of right bronchus or lung: Secondary | ICD-10-CM

## 2020-01-15 DIAGNOSIS — J449 Chronic obstructive pulmonary disease, unspecified: Secondary | ICD-10-CM | POA: Diagnosis not present

## 2020-01-15 DIAGNOSIS — I700 Atherosclerosis of aorta: Secondary | ICD-10-CM | POA: Diagnosis not present

## 2020-01-15 DIAGNOSIS — Z79899 Other long term (current) drug therapy: Secondary | ICD-10-CM | POA: Diagnosis not present

## 2020-01-15 DIAGNOSIS — E119 Type 2 diabetes mellitus without complications: Secondary | ICD-10-CM | POA: Diagnosis not present
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
## 2020-01-16 ENCOUNTER — Telehealth: Payer: Self-pay | Admitting: Internal Medicine

## 2020-01-16 DIAGNOSIS — J449 Chronic obstructive pulmonary disease, unspecified: Secondary | ICD-10-CM | POA: Diagnosis not present

## 2020-01-16 NOTE — Telephone Encounter (Signed)
Scheduled per los. Called and left msg. Mailed printout  °

## 2020-01-17 ENCOUNTER — Ambulatory Visit (INDEPENDENT_AMBULATORY_CARE_PROVIDER_SITE_OTHER): Payer: Medicare Other | Admitting: Cardiology

## 2020-01-17 ENCOUNTER — Other Ambulatory Visit: Payer: Self-pay

## 2020-01-17 ENCOUNTER — Encounter: Payer: Self-pay | Admitting: Cardiology

## 2020-01-17 VITALS — BP 108/66 | HR 82 | Ht 75.0 in | Wt 190.0 lb

## 2020-01-17 DIAGNOSIS — I5022 Chronic systolic (congestive) heart failure: Secondary | ICD-10-CM | POA: Diagnosis not present

## 2020-01-17 DIAGNOSIS — J449 Chronic obstructive pulmonary disease, unspecified: Secondary | ICD-10-CM

## 2020-01-17 DIAGNOSIS — I739 Peripheral vascular disease, unspecified: Secondary | ICD-10-CM

## 2020-01-17 MED ORDER — SACUBITRIL-VALSARTAN 24-26 MG PO TABS: 1.0000 | ORAL_TABLET | Freq: Two times a day (BID) | ORAL | 11 refills | Status: DC
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
## 2020-01-17 NOTE — Patient Instructions (Signed)
Medication Instructions:  Please start Entresto 24-26 mg twice a day. The current medical regimen is effective;  continue present plan and medications.  *If you need a refill on your cardiac medications before your next appointment, please call your pharmacy*  Follow-Up: At [facility], you and your health needs are our priority.  As part of our continuing mission to provide you with exceptional heart care, we have created designated Provider Care Teams.  These Care Teams include your primary Cardiologist (physician) and Advanced Practice Providers (APPs -  Physician Assistants and Nurse Practitioners) who all work together to provide you with the care you need, when you need it.  We recommend signing up for the patient portal called "MyChart".  Sign up information is provided on this After Visit Summary.  MyChart is used to connect with patients for Virtual Visits (Telemedicine).  Patients are able to view lab/test results, encounter notes, upcoming appointments, etc.  Non-urgent messages can be sent to your provider as well.   To learn more about what you can do with MyChart, go to [email or web address].    Your next appointment:   2 - 4 week(s)  The format for your next appointment:   In Person  Provider:   You may see Dr Candee Furbish or one of the following Advanced Practice Providers on your designated Care Team:    Truitt Merle, NP  Cecilie Kicks, NP  Kathyrn Drown, NP   Thank you for choosing [facility]!!

## 2020-01-21 ENCOUNTER — Telehealth: Payer: Self-pay | Admitting: Cardiology

## 2020-01-21 NOTE — Telephone Encounter (Signed)
New Message:   Pt said Dr Marlou Porch told him on 01-17-20 to come back in 2 to 3 weeks. His recall says 6 months.  I told pt I would check with nurse to see which one was right.

## 2020-01-21 NOTE — Telephone Encounter (Signed)
Pt aware his appt is as scheduled.

## 2020-01-21 NOTE — Telephone Encounter (Signed)
At 4/23 office visit with Dr Marlou Porch - pt was instructed to follow up in 2 to 4 weeks.  He was scheduled for f/u 02/04/2020 at 4 pm.  This was scheduled and given to him before he left that day.

## 2020-01-22 DIAGNOSIS — H40013 Open angle with borderline findings, low risk, bilateral: Secondary | ICD-10-CM | POA: Diagnosis not present

## 2020-01-22 DIAGNOSIS — H35033 Hypertensive retinopathy, bilateral: Secondary | ICD-10-CM | POA: Diagnosis not present

## 2020-01-22 DIAGNOSIS — H2513 Age-related nuclear cataract, bilateral: Secondary | ICD-10-CM | POA: Diagnosis not present

## 2020-01-22 DIAGNOSIS — H524 Presbyopia: Secondary | ICD-10-CM | POA: Diagnosis not present

## 2020-01-22 DIAGNOSIS — H0288A Meibomian gland dysfunction right eye, upper and lower eyelids: Secondary | ICD-10-CM | POA: Diagnosis not present

## 2020-01-22 DIAGNOSIS — E119 Type 2 diabetes mellitus without complications: Secondary | ICD-10-CM | POA: Diagnosis not present

## 2020-01-28 ENCOUNTER — Other Ambulatory Visit: Payer: Self-pay | Admitting: Internal Medicine

## 2020-01-28 DIAGNOSIS — J449 Chronic obstructive pulmonary disease, unspecified: Secondary | ICD-10-CM

## 2020-02-04 ENCOUNTER — Ambulatory Visit: Payer: Medicare Other | Admitting: Cardiology

## 2020-02-11 ENCOUNTER — Ambulatory Visit: Payer: Self-pay | Admitting: *Deleted

## 2020-02-12 ENCOUNTER — Ambulatory Visit: Payer: Medicare Other | Admitting: Podiatry

## 2020-02-15 DIAGNOSIS — J449 Chronic obstructive pulmonary disease, unspecified: Secondary | ICD-10-CM | POA: Diagnosis not present

## 2020-02-22 IMAGING — CT CT CHEST WITH CONTRAST
2 of 4 series · 15 of 36 positions shown, 18 images · IV contrast (omnipaque)
Comparison: 11/27/2017 chest CT.

CLINICAL DATA: Stage IA right upper lobe lung adenocarcinoma status
post right upper lobectomy 09/04/2015. Restaging.

EXAM:
CT CHEST WITH CONTRAST
TECHNIQUE: Multidetector CT imaging of the chest was performed during
intravenous contrast administration.
CONTRAST:  75mL OMNIPAQUE IOHEXOL 300 MG/ML  SOLN

[Series 2: axial st · axial · 0.79mm/px · z∈[+68,+368]mm · 12 of 176 slices shown, 15 images]
[im 13/176  mediastinal]
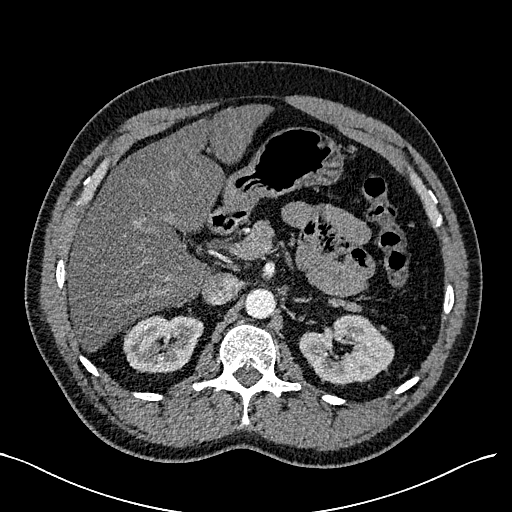
[im 13/176  lung]
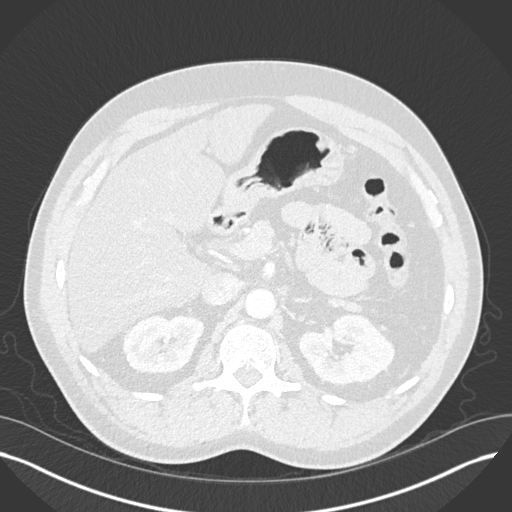
[im 26/176  lung]
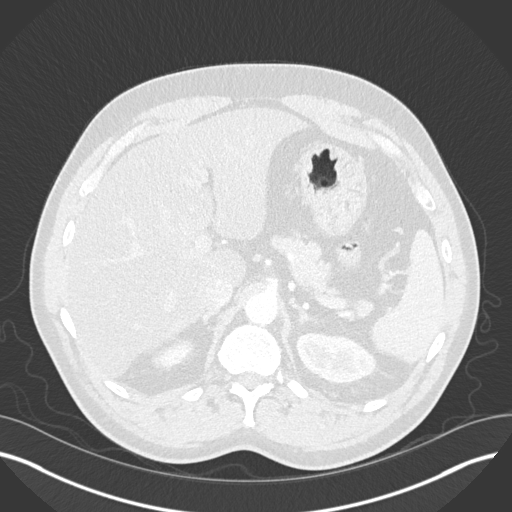
[im 38/176  lung]
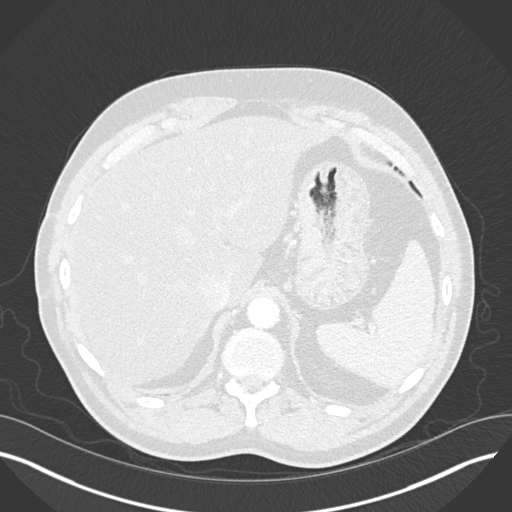
[im 51/176  lung]
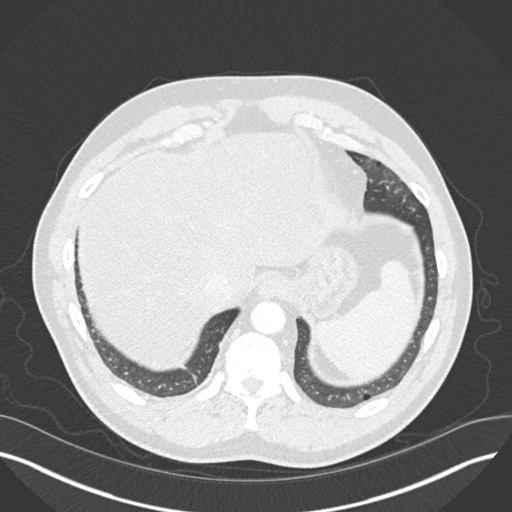
[im 63/176  mediastinal]
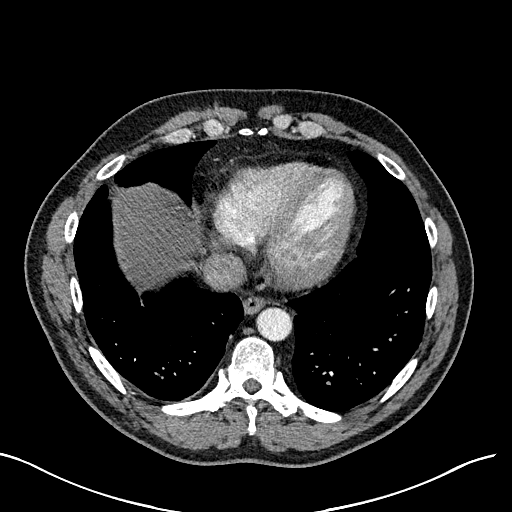
[im 63/176  lung]
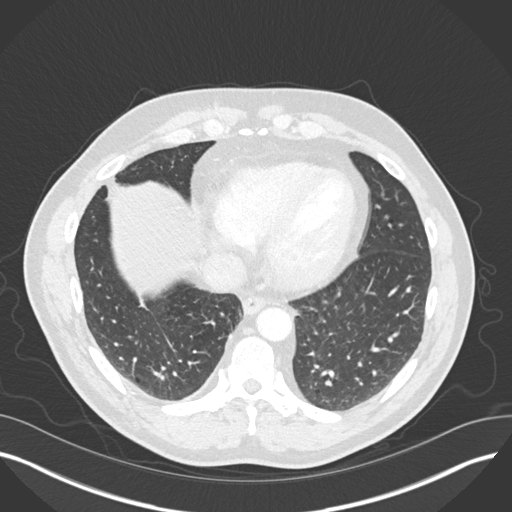
[im 76/176  lung]
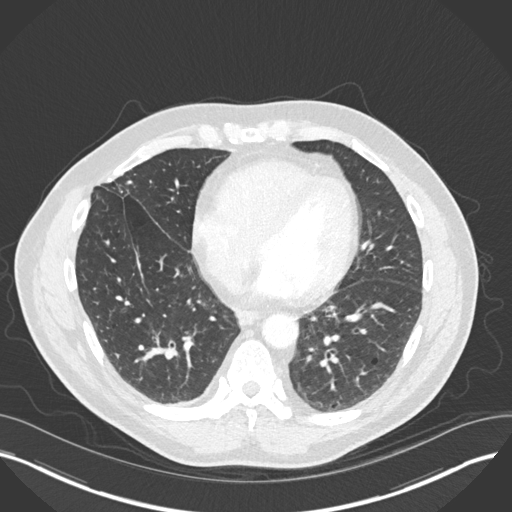
[im 101/176  lung]
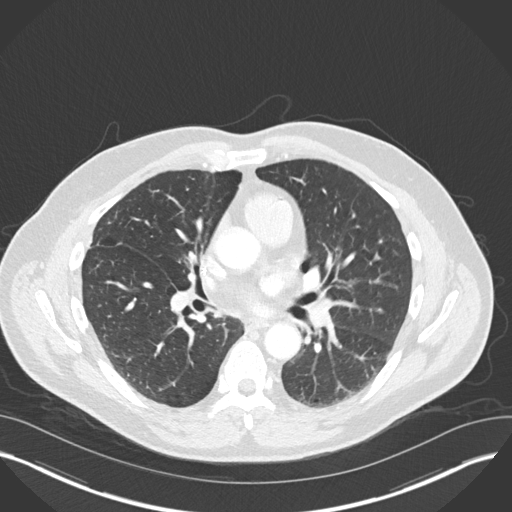
[im 113/176  lung]
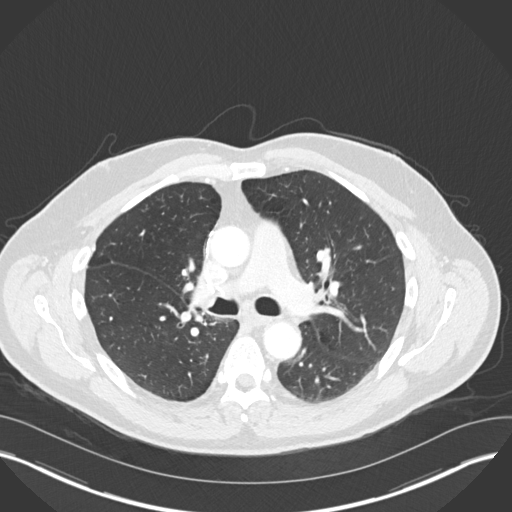
[im 126/176  mediastinal]
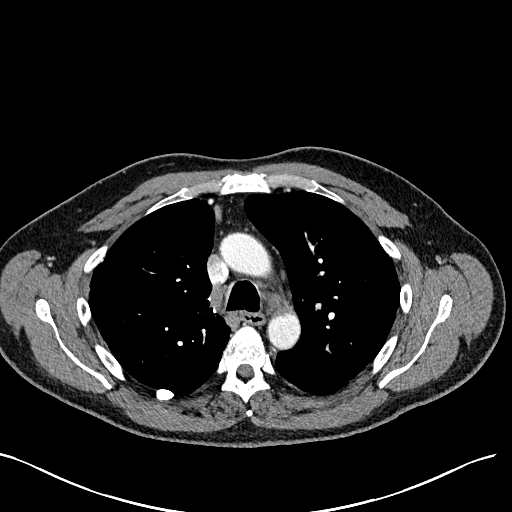
[im 126/176  lung]
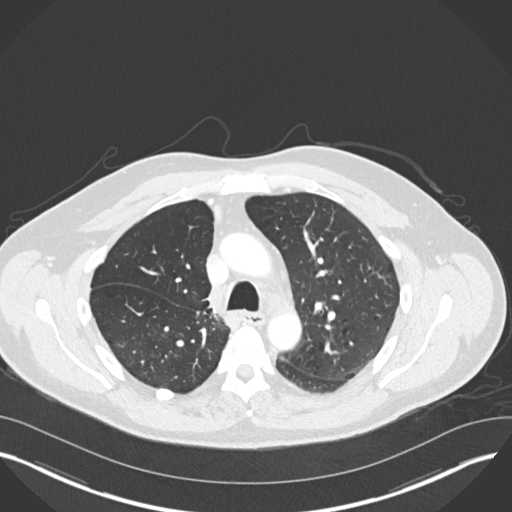
[im 138/176  lung]
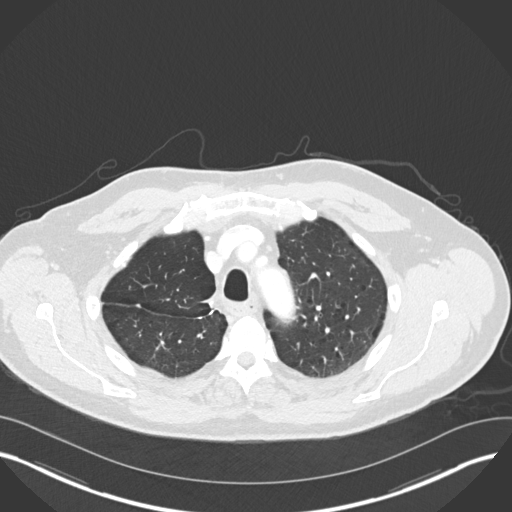
[im 151/176  lung]
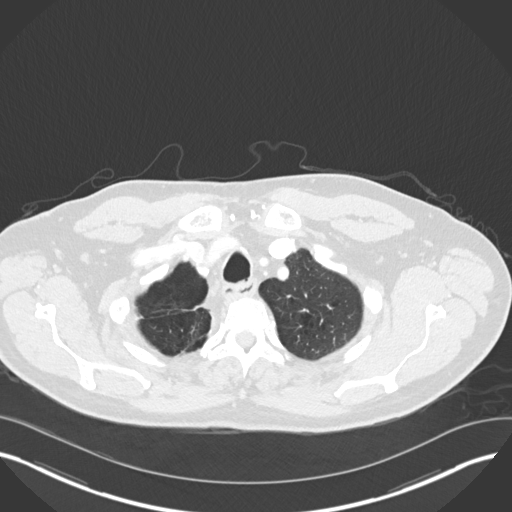
[im 163/176  lung]
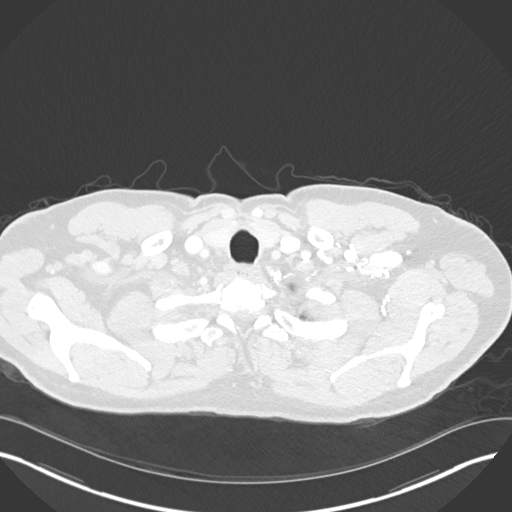

[Series 5: coronal · coronal · 0.79mm/px · 3 of 135 slices shown]
[im 27/135  lung]
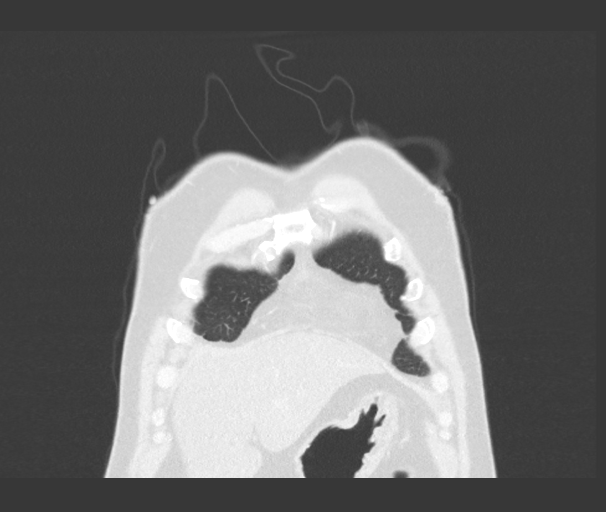
[im 54/135  lung]
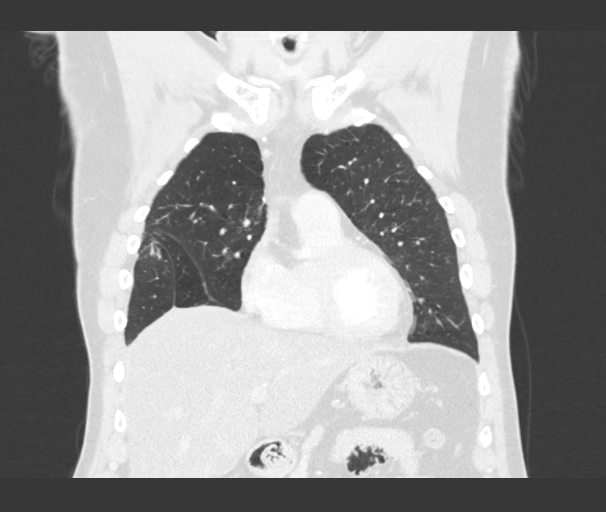
[im 81/135  lung]
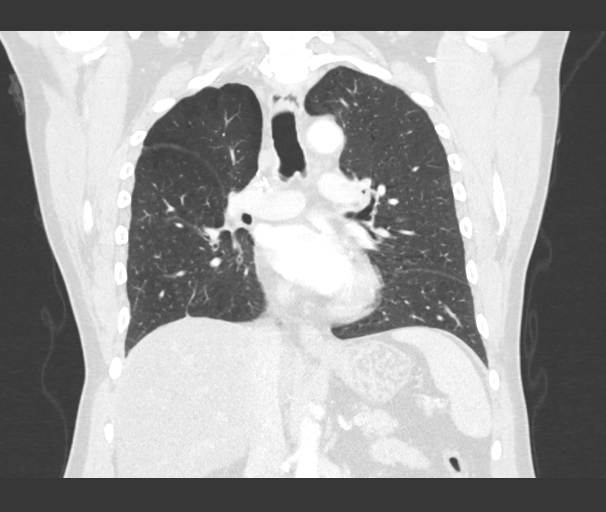

[15 of 36 positions shown; findings below may reference images not displayed]

FINDINGS: Cardiovascular: Normal heart size. No significant pericardial
effusion/thickening. Left anterior descending and left circumflex
coronary atherosclerosis. Mildly atherosclerotic nonaneurysmal
thoracic aorta. Normal caliber pulmonary arteries. No central
pulmonary emboli.

Mediastinum/Nodes: No discrete thyroid nodules. Unremarkable
esophagus. No pathologically enlarged axillary, mediastinal or hilar
lymph nodes.

Lungs/Pleura: No pneumothorax. No pleural effusion. Status post
right upper lobectomy. Moderate centrilobular and paraseptal
emphysema with diffuse bronchial wall thickening. No acute
consolidative airspace disease, lung masses or significant pulmonary
nodules.

Upper abdomen: Stable subcentimeter hypodense upper renal cortical
lesions bilaterally, too small to characterize, considered benign.
Diffuse hepatic steatosis.

Musculoskeletal:  No aggressive appearing focal osseous lesions.
IMPRESSION: 1. No evidence of local tumor recurrence status post right upper
lobectomy.
2. No findings of metastatic disease in the chest.

Aortic Atherosclerosis (JWQMO-49H.H) and Emphysema (JWQMO-4NG.J).

## 2020-02-25 ENCOUNTER — Other Ambulatory Visit: Payer: Self-pay | Admitting: Internal Medicine

## 2020-02-25 DIAGNOSIS — J301 Allergic rhinitis due to pollen: Secondary | ICD-10-CM

## 2020-02-26 ENCOUNTER — Ambulatory Visit: Payer: Medicare Other | Admitting: Cardiology
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
## 2020-02-28 ENCOUNTER — Encounter: Payer: Self-pay | Admitting: Internal Medicine

## 2020-03-02 ENCOUNTER — Ambulatory Visit (HOSPITAL_COMMUNITY)
Admission: AD | Admit: 2020-03-02 | Discharge: 2020-03-02 | Disposition: A | Discharge status: Home / Self Care | Payer: Medicare Other | Attending: Psychiatry | Admitting: Psychiatry

## 2020-03-02 ENCOUNTER — Other Ambulatory Visit: Payer: Self-pay | Admitting: *Deleted

## 2020-03-02 DIAGNOSIS — Z1339 Encounter for screening examination for other mental health and behavioral disorders: Secondary | ICD-10-CM | POA: Insufficient documentation

## 2020-03-02 DIAGNOSIS — F1020 Alcohol dependence, uncomplicated: Secondary | ICD-10-CM | POA: Insufficient documentation
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
## 2020-03-02 NOTE — Telephone Encounter (Signed)
FYI:   Neb supply and rx form to sign should be coming in soon, if it hasn't already.

## 2020-03-02 NOTE — H&P (Addendum)
Behavioral Health Medical Screening Exam  [name] is a 59 y.o. male patient presented to [facility] as a walk in with complaints of depression and having a bad day.  States he called medical provider and told how he was feeling and was told to come to hospital for assessment.  Patient states that he does not feel suicidal "I was just sitting at home alone and having a bad day.  I've been drinking all day.  I'm a alcoholic.  I drink about a 12 pack beer a day.  I don't like being around a lot of people and don't like a lot of people around me.  I was just having a bad day and needed to talk to somebody."  Patient states that he does have a support system "My best friend [name] we talk about almost everyday."  Patient states that he has had therapy in the past and it helped; states that he is interested in therapy again.   During evaluation [name] is alert/oriented x 4; calm/cooperative, pleasant, joking around, and laughing; and mood is congruent with affect.  "I feel a lot better since talking to y'all."  He does not appear to be responding to internal/external stimuli or delusional thoughts.  Patient denies suicidal/self-harm/homicidal ideation, psychosis, and paranoia.  Patient answered question appropriately.     Total Time spent with patient: 30 minutes  Psychiatric Specialty Exam  Presentation  General Appearance: No data recorded Eye Contact:No data recorded Speech:No data recorded Speech Volume:No data recorded Handedness:No data recorded  Mood and Affect  Mood:No data recorded Affect:No data recorded  Thought Process  Thought Processes:No data recorded Descriptions of Associations:No data recorded Orientation:No data recorded Thought Content:No data recorded Hallucinations:No data recorded Ideas of Reference:No data recorded Suicidal Thoughts:No data recorded Homicidal Thoughts:No data recorded  Sensorium  Memory:No data recorded Judgment:No data  recorded Insight:No data recorded  Executive Functions  Concentration:No data recorded Attention Span:No data recorded Recall:No data recorded Fund of Knowledge:No data recorded Language:No data recorded  Psychomotor Activity  Psychomotor Activity:No data recorded  Assets  Assets:No data recorded  Sleep  Sleep:No data recorded  Physical Exam: Physical Exam Vitals and nursing note reviewed. Exam conducted with a chaperone present.  Pulmonary:     Effort: Pulmonary effort is normal.  Musculoskeletal:        General: Normal range of motion.  Skin:    General: Skin is warm and dry.  Neurological:     Mental Status: He is alert.    Review of Systems  Psychiatric/Behavioral: Negative for hallucinations, memory loss and suicidal ideas. Depression: Reporting depression related to recent death of nephew. The patient is not nervous/anxious. Insomnia: Managed with medication.        Patient states he had a sister to pass away in January 2021 and mother passed 1 wk later and then the recent death of a nephew last week caused depression   All other systems reviewed and are negative.  Blood pressure 113/84, pulse (!) 109, temperature 98.1 F (36.7 C), temperature source Oral, resp. rate 18, SpO2 96 %. There is no height or weight on file to calculate BMI.  Musculoskeletal: Strength & Muscle Tone: within normal limits Gait & Station: normal Patient leans: N/A   Recommendations:  Outpatient psychiatric services.  Patient referred to [facility].    Based on my evaluation the patient does not appear to have an emergency medical condition.  Sady Monaco, NP 03/02/2020, 6:07 PM

## 2020-03-02 NOTE — Patient Outreach (Signed)
[facility]) Care Management  03/02/2020  KOLTER REAVER 04-19-1961 [id number]  RN Health Coach telephone call to patient.  [employer] compliance verified. RN discussed patient awesome decrease in A1C from  8.1 to 6.0. Patient stated the synjardy has been causing him abdominal cramps and diarrhea 4-5 times a day. He has been unable to go out of the house. Per patient he wanted to start back at the gym but has been unable to go. Patient went to the [facility] dentist today. Patient went for eye care 3 months ago and was told he had the beginning of a cataract. Per patient he has not had any alcohol  in 2 months. Patient is smoking 1 pk a day. Patient has received COVID vaccine.  Patient started crying and stated he did not want to be resuscitated and wanted to die. Patient had suicidal ideations and a plan. 4:01 [place] had co-worker Hubert Azure RN  contact 25 while [place] kept patient on phone talking. 4:12 Police arrived and patient agreed to go to hospital . Police transported patient too behavior health hospital. RN stayed on phone with patient until arrival at 4:57  into behavior health.  Plan: Dr Ronnald Ramp notified of the above RN call [facility] patient friend to go to behavorial health and pick up home key to feed cat as patient requested.   [facility] Care Management [phone]

## 2020-03-03 ENCOUNTER — Other Ambulatory Visit: Payer: Self-pay

## 2020-03-03 ENCOUNTER — Encounter: Payer: Self-pay | Admitting: Internal Medicine

## 2020-03-03 ENCOUNTER — Ambulatory Visit (INDEPENDENT_AMBULATORY_CARE_PROVIDER_SITE_OTHER): Payer: Medicare Other | Admitting: Internal Medicine

## 2020-03-03 VITALS — BP 130/80 | HR 104 | Temp 98.3°F | Resp 16 | Ht 75.0 in | Wt 183.2 lb

## 2020-03-03 DIAGNOSIS — D696 Thrombocytopenia, unspecified: Secondary | ICD-10-CM | POA: Diagnosis not present

## 2020-03-03 DIAGNOSIS — A530 Latent syphilis, unspecified as early or late: Secondary | ICD-10-CM

## 2020-03-03 DIAGNOSIS — F200 Paranoid schizophrenia: Secondary | ICD-10-CM | POA: Diagnosis not present

## 2020-03-03 DIAGNOSIS — E118 Type 2 diabetes mellitus with unspecified complications: Secondary | ICD-10-CM

## 2020-03-03 DIAGNOSIS — R7989 Other specified abnormal findings of blood chemistry: Secondary | ICD-10-CM

## 2020-03-03 LAB — HEPATIC FUNCTION PANEL
ALT: 60 U/L — ABNORMAL HIGH (ref 0–53)
AST: 68 U/L — ABNORMAL HIGH (ref 0–37)
Albumin: 4.4 g/dL (ref 3.5–5.2)
Alkaline Phosphatase: 74 U/L (ref 39–117)
Bilirubin, Direct: 0.3 mg/dL (ref 0.0–0.3)
Total Bilirubin: 0.8 mg/dL (ref 0.2–1.2)
Total Protein: 7.5 g/dL (ref 6.0–8.3)

## 2020-03-03 LAB — CBC WITH DIFFERENTIAL/PLATELET
Basophils Absolute: 0.1 10*3/uL (ref 0.0–0.1)
Basophils Relative: 0.7 % (ref 0.0–3.0)
Eosinophils Absolute: 0 10*3/uL (ref 0.0–0.7)
Eosinophils Relative: 0.4 % (ref 0.0–5.0)
HCT: 49.8 % (ref 39.0–52.0)
Hemoglobin: 17.1 g/dL — ABNORMAL HIGH (ref 13.0–17.0)
Lymphocytes Relative: 21.5 % (ref 12.0–46.0)
Lymphs Abs: 1.9 10*3/uL (ref 0.7–4.0)
MCHC: 34.3 g/dL (ref 30.0–36.0)
MCV: 99 fl (ref 78.0–100.0)
Monocytes Absolute: 0.8 10*3/uL (ref 0.1–1.0)
Monocytes Relative: 9.2 % (ref 3.0–12.0)
Neutro Abs: 5.9 10*3/uL (ref 1.4–7.7)
Neutrophils Relative %: 68.2 % (ref 43.0–77.0)
Platelets: 128 10*3/uL — ABNORMAL LOW (ref 150.0–400.0)
RBC: 5.03 Mil/uL (ref 4.22–5.81)
RDW: 15 % (ref 11.5–15.5)
WBC: 8.6 10*3/uL (ref 4.0–10.5)

## 2020-03-03 LAB — POCT GLYCOSYLATED HEMOGLOBIN (HGB A1C): Hemoglobin A1C: 5.9 % — AB (ref 4.0–5.6)

## 2020-03-03 LAB — PROTIME-INR
INR: 1 ratio (ref 0.8–1.0)
Prothrombin Time: 11.1 s (ref 9.6–13.1)

## 2020-03-03 MED ORDER — VRAYLAR 4.5 MG PO CAPS: 1.0000 | ORAL_CAPSULE | Freq: Every day | ORAL | 0 refills | Status: AC

## 2020-03-03 MED ORDER — CARIPRAZINE HCL 3 MG PO CAPS: 3.0000 mg | ORAL_CAPSULE | Freq: Every day | ORAL | 0 refills | Status: AC

## 2020-03-03 MED ORDER — CARIPRAZINE HCL 1.5 MG PO CAPS: 1.5000 mg | ORAL_CAPSULE | Freq: Every day | ORAL | 0 refills | Status: AC

## 2020-03-03 NOTE — Progress Notes (Signed)
Subjective:  Patient ID: [name], male    DOB: [birth year]  Age: 59 y.o. MRN: [id number]  CC: Diabetes  This visit occurred during the SARS-CoV-2 public health emergency.  Safety protocols were in place, including screening questions prior to the visit, additional usage of staff PPE, and extensive cleaning of exam room while observing appropriate contact time as indicated for disinfecting solutions.    HPI [name] presents for f/up - He is taking Synjardy every other day because when he takes it he experiences diarrhea.  He tells me his blood sugars have been well controlled and he denies polys.  He says he has been abstaining from alcohol for 2 weeks now.  He continues to have a baseline level of coughing, wheezing, and shortness of breath.  He denies any recent episodes of chest pain, hemoptysis, weight loss, abdominal pain, nausea, vomiting, dysuria, or hematuria.  Outpatient Medications Prior to Visit  Medication Sig Dispense Refill  . ACCU-CHEK AVIVA PLUS test strip use to check blood sugar every morning    . acetaminophen (TYLENOL) 325 MG tablet Take 650 mg by mouth every 6 (six) hours as needed for headache.    . albuterol (PROAIR HFA) 108 (90 Base) MCG/ACT inhaler Inhale 2 puffs into the lungs every 4 (four) hours as needed for wheezing. 3 Inhaler 3  . albuterol (PROVENTIL) (2.5 MG/3ML) 0.083% nebulizer solution inhale 3 milliliters (2.5 mg) by nebulization route every 6 hours as needed for 30 days    . ANORO ELLIPTA 62.5-25 MCG/INH AEPB Inhale 1 puff into the lungs daily. 90 each 1  . carvedilol (COREG) 12.5 MG tablet Take 1 tablet (12.5 mg total) by mouth 2 (two) times daily with a meal. Follow- appt due in June must see provider for future refills 180 tablet 1  . cetirizine (ZYRTEC) 10 MG tablet Take 1 tablet (10 mg total) by mouth daily. 90 tablet 1  . dicyclomine (BENTYL) 20 MG tablet Take 1 tablet (20 mg total) by mouth 3 (three) times daily before meals. 270 tablet  0  . esomeprazole (NEXIUM) 40 MG capsule Take 1 capsule (40 mg total) by mouth 2 (two) times daily before a meal. 90 capsule 1  . fluticasone (FLONASE) 50 MCG/ACT nasal spray Place 2 sprays into both nostrils daily. 48 g 1  . pravastatin (PRAVACHOL) 40 MG tablet Take 1 tablet (40 mg total) by mouth daily. 90 tablet 1  . sacubitril-valsartan (ENTRESTO) 24-26 MG Take 1 tablet by mouth 2 (two) times daily. 60 tablet 11  . thiamine 100 MG tablet Take 1 tablet (100 mg total) by mouth every other day. 45 tablet 1  . traZODone (DESYREL) 100 MG tablet Take 3 tablets (300 mg total) by mouth at bedtime. 270 tablet 1  . TRUVADA 200-300 MG tablet Take 1 tablet by mouth daily. 90 tablet 0  . SYNJARDY 01-999 MG TABS Take 1 tablet by mouth 2 (two) times daily. 180 tablet 0   No facility-administered medications prior to visit.    ROS Review of Systems  Constitutional: Negative for appetite change, diaphoresis, fatigue and unexpected weight change.  HENT: Negative.   Eyes: Negative.   Respiratory: Positive for cough, shortness of breath and wheezing. Negative for chest tightness and stridor.   Cardiovascular: Negative for chest pain, palpitations and leg swelling.  Gastrointestinal: Positive for diarrhea. Negative for abdominal pain, constipation, nausea and vomiting.  Endocrine: Negative.  Negative for polydipsia, polyphagia and polyuria.  Genitourinary: Negative.  Negative for difficulty  urinating, dysuria and hematuria.  Musculoskeletal: Negative for arthralgias and myalgias.  Skin: Negative.  Negative for pallor and rash.  Neurological: Negative.  Negative for dizziness, weakness, light-headedness and numbness.  Hematological: Negative.   Psychiatric/Behavioral: Positive for hallucinations. Negative for behavioral problems, confusion, dysphoric mood, self-injury, sleep disturbance and suicidal ideas. The patient is nervous/anxious.        He has had several family members die recently from COVID-27  infection.  He complains that over the last few weeks he has developed some delusions and hallucinations.  He has no longer taking Seroquel.  He is agreeable to restarting an antipsychotic.    Objective:  BP 130/80 (BP Location: Left Arm, Patient Position: Sitting, Cuff Size: Normal)   Pulse (!) 104   Temp 98.3 F (36.8 C) (Oral)   Resp 16   Ht 6\' 3"  (1.905 m)   Wt 183 lb 4 oz (83.1 kg)   SpO2 94%   BMI 22.90 kg/m   BP Readings from Last 3 Encounters:  03/04/20 120/87  03/03/20 130/80  01/17/20 108/66    Wt Readings from Last 3 Encounters:  03/04/20 184 lb (83.5 kg)  03/03/20 183 lb 4 oz (83.1 kg)  01/17/20 190 lb (86.2 kg)    Physical Exam Vitals reviewed.  Constitutional:      General: He is not in acute distress.    Appearance: Normal appearance. He is not ill-appearing, toxic-appearing or diaphoretic.  HENT:     Nose: Nose normal.     Mouth/Throat:     Mouth: Mucous membranes are moist.  Eyes:     General: No scleral icterus.    Conjunctiva/sclera: Conjunctivae normal.  Cardiovascular:     Rate and Rhythm: Normal rate and regular rhythm.     Heart sounds: No murmur heard.   Pulmonary:     Effort: Pulmonary effort is normal.     Breath sounds: No stridor. No wheezing, rhonchi or rales.  Abdominal:     General: Abdomen is flat. Bowel sounds are normal. There is no distension.     Palpations: Abdomen is soft. There is no hepatomegaly, splenomegaly or mass.     Tenderness: There is no abdominal tenderness.  Musculoskeletal:        General: Normal range of motion.     Cervical back: Neck supple.     Right lower leg: No edema.     Left lower leg: No edema.  Lymphadenopathy:     Cervical: No cervical adenopathy.  Skin:    General: Skin is warm and dry.     Coloration: Skin is not pale.  Neurological:     General: No focal deficit present.     Mental Status: He is alert.  Psychiatric:        Mood and Affect: Mood normal.        Behavior: Behavior normal.         Thought Content: Thought content normal.        Judgment: Judgment normal.     Lab Results  Component Value Date   WBC 8.2 03/04/2020   HGB 17.6 (H) 03/04/2020   HCT 51.7 03/04/2020   PLT 128 (L) 03/04/2020   GLUCOSE 106 (H) 03/04/2020   CHOL 181 06/26/2019   TRIG 155.0 (H) 06/26/2019   HDL 33.90 (L) 06/26/2019   LDLDIRECT 122.0 11/30/2016   LDLCALC 116 (H) 06/26/2019   ALT 53 (H) 03/04/2020   AST 54 (H) 03/04/2020   NA 139 03/04/2020   K 3.7  03/04/2020   CL 106 03/04/2020   CREATININE 0.67 03/04/2020   BUN <5 (L) 03/04/2020   CO2 19 (L) 03/04/2020   TSH 0.90 06/26/2019   PSA 0.63 12/04/2019   INR 1.0 03/03/2020   HGBA1C 5.9 (A) 03/03/2020   MICROALBUR 11.8 (H) 12/04/2019    No results found.  Assessment & Plan:   Townsend was seen today for diabetes.  Diagnoses and all orders for this visit:  Type II diabetes mellitus with manifestations ([place])- His A1c is down to 5.9% and he is experiencing diarrhea.  I recommended that he stop taking Synjardy. -     POCT glycosylated hemoglobin (Hb A1C)  Elevated LFTs- His LFTs have improved.  Screening for viral hepatitis is negative.  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  I praised him for abstaining from alcohol use and ask him to continue to abstain.  His meld score is reassuring at 7. -     Hepatic function panel; Future -     Protime-INR; Future -     Hepatitis C antibody; Future -     Hepatitis B surface antigen; Future -     Hepatitis B surface antigen -     Hepatitis C antibody -     Protime-INR -     Hepatic function panel  Paranoid schizophrenia ([place])- I have asked him to start taking Vraylar.  Will start at a low dose and gradually increase the dose over the next few weeks. -     cariprazine (VRAYLAR) capsule; Take 1 capsule (1.5 mg total) by mouth daily for 14 days. -     Cariprazine HCl (VRAYLAR) 4.5 MG CAPS; Take 1 capsule (4.5 mg total) by mouth daily for 21 days. -     cariprazine (VRAYLAR) capsule; Take 1  capsule (3 mg total) by mouth daily for 21 days.  Thrombocytopenia ([place])- His platelet count is stable and he is having no episodes of bleeding or bruising.  This is likely ITP. -     CBC with Differential/Platelet; Future -     CBC with Differential/Platelet  Syphili, latent   I have discontinued Luman T. [place]ado's Synjardy. I am also having him start on cariprazine, Vraylar, and cariprazine. Additionally, I am having him maintain his carvedilol, albuterol, fluticasone, acetaminophen, esomeprazole, pravastatin, albuterol, Accu-Chek Aviva Plus, traZODone, thiamine, Truvada, dicyclomine, sacubitril-valsartan, Anoro Ellipta, and cetirizine.  Meds ordered this encounter  Medications  . cariprazine (VRAYLAR) capsule    Sig: Take 1 capsule (1.5 mg total) by mouth daily for 14 days.    Dispense:  14 capsule    Refill:  0  . Cariprazine HCl (VRAYLAR) 4.5 MG CAPS    Sig: Take 1 capsule (4.5 mg total) by mouth daily for 21 days.    Dispense:  21 capsule    Refill:  0  . cariprazine (VRAYLAR) capsule    Sig: Take 1 capsule (3 mg total) by mouth daily for 21 days.    Dispense:  21 capsule    Refill:  0   I spent 50 minutes in preparing to see the patient by review of recent labs, imaging and procedures, obtaining and reviewing separately obtained history, communicating with the patient and family or caregiver, ordering medications, tests or procedures, and documenting clinical information in the EHR including the differential Dx, treatment, and any further evaluation and other management of 1. Type II diabetes mellitus with manifestations ([facility]) 2. Elevated LFTs 3. Paranoid schizophrenia ([place]) 4. Thrombocytopenia ([place])    Follow-up: Return in  about 2 months (around 05/03/2020).  Scarlette Calico, MD

## 2020-03-03 NOTE — Patient Instructions (Signed)
Schizophrenia Schizophrenia is a mental illness. It may cause disturbed or disorganized thinking, speech, or behavior. People with schizophrenia have problems functioning in one or more areas of life. People with schizophrenia are at increased risk for suicide, certain long-term (chronic) physical illnesses, and unhealthy behaviors, such as smoking and drug use. People who have family members with schizophrenia are at higher risk of developing the illness. Schizophrenia affects men and women equally, but it usually appears at an earlier age (teenage or early adult years) in men. What are the causes? The cause of this condition is not known. What increases the risk? The following factors may make you more likely to develop this condition:  Having a family member who has schizophrenia. Some gene combinations may increase the risk, but there is no single gene that causes schizophrenia.  Impaired brain or neurotransmitter development or chemistry. Neurotransmitters are chemicals in the brain. What are the signs or symptoms? The earliest symptoms are often subtle and may go unnoticed until the illness becomes more severe (first-break psychosis). Symptoms of schizophrenia may be ongoing (continuous) or may come and go in severity. Episodes are often triggered by major life events, such as:  Family stress.  College.  Armed forces logistics/support/administrative officer.  Marriage.  Pregnancy or childbirth.  Divorce.  Loss of a loved one. Symptoms may include:  Seeing, hearing, or feeling things that do not exist (hallucinations).  Having false beliefs (delusions). Delusions often involve beliefs that you are being attacked, harassed, cheated, persecuted, or conspired against (persecutory delusions).  Speech that does not make sense to others or is hard to understand (incoherent).  Behavior that is odd, confused, unfocused, withdrawn, or disorganized.  Extremely overactive or underactive motor activity (catatonia). Motor  activity is any action that involves the muscles.  Bland or blunted emotions (flat affect).  Loss of will power (avolition).  Withdrawal from social contacts (social isolation). Symptoms may affect the level of functioning in one or more major areas of life, such as work, school, relationships, or self-care. How is this diagnosed? Schizophrenia is diagnosed through an assessment by a mental health care provider.  Your mental health care provider may ask questions about: ? Your thoughts, behavior, and mood. ? Your ability to function in daily life. ? Your medical history. ? Any use of alcohol or drugs, including prescription medicines.  You may have blood tests and imaging exams. How is this treated? Schizophrenia is a chronic illness that is best controlled with continuous treatment rather than treatment only when symptoms occur. The following treatments are used to manage schizophrenia:  Medicine. This is the most effective and important form of treatment for schizophrenia. Antipsychotic medicines are usually prescribed to help manage schizophrenia. Other types of medicine may be added to relieve any symptoms that may occur despite the use of antipsychotic medicines.  Counseling or talk therapy. Individual, group, or family counseling may be helpful in providing education, support, and guidance. Many people also benefit from social skills and job skills (vocational) training. A combination of medicine and counseling is best for managing the disorder over time. A procedure in which electricity is applied to the brain through the scalp (electroconvulsive therapy) may be used to treat catatonic schizophrenia or schizophrenia in people who cannot take medicine or do not respond to medicine and counseling. Follow these instructions at home:   Keep stress under control. Stress may trigger psychosis and make symptoms worse.  Try to get as much sleep as you can.  Avoid alcohol and drugs.  They can affect how medicine works and make symptoms worse.  Surround yourself with people who care about you and can help you manage your condition.  Take over-the-counter and prescription medicines only as told by your health care provider.  Keep all follow-up visits as told by your health care provider and counselor. This is important. Contact a health care provider if:  You have a bad response to changes in medicines or to your treatment plan.  You have trouble falling sleep.  You have a low mood that will not go away.  You are using: ? Drugs. ? Too much caffeine. ? Tobacco products. ? Alcohol. Get help right away if:  You feel out of control.  You or others notice warning signs of suicide such as: ? Increased use of drugs or alcohol ? Expressing feelings of not having a purpose in life, being trapped, guilty, anxious and agitated, or hopeless. ? Withdrawing from friends and family. ? Showing uncontrolled anger, recklessness, and dramatic mood changes. ? Talking about suicide, discussing or searching for methods. If you ever feel like you may hurt yourself or others, or have thoughts about taking your own life, get help right away. You can go to your nearest emergency department or call:  Your local emergency services (911 in the [place]  A suicide crisis helpline, such as the [facility] at 641-341-5931. This is open 24 hours a day. Summary  Schizophrenia is a mental illness that causes disturbed or disorganized thinking, speech, or behavior.  Symptoms of schizophrenia may be ongoing or may come and go. They are often triggered by major life events.  Keep stress under control. Stress may trigger psychosis and make symptoms worse.  Avoid alcohol and drugs. They can affect how medicine works and make symptoms worse.  Get help right away if you feel out of control. This information is not intended to replace advice given to you by your health  care provider. Make sure you discuss any questions you have with your health care provider. Document Revised: 08/25/2017 Document Reviewed: 06/24/2016 Elsevier Patient Education  2020 [employer].

## 2020-03-04 ENCOUNTER — Encounter (HOSPITAL_COMMUNITY): Payer: Self-pay | Admitting: Emergency Medicine

## 2020-03-04 ENCOUNTER — Emergency Department (HOSPITAL_COMMUNITY)
Admission: EM | Admit: 2020-03-04 | Discharge: 2020-03-04 | Disposition: A | Discharge status: Home / Self Care | Payer: Medicare Other | Attending: Emergency Medicine | Admitting: Emergency Medicine

## 2020-03-04 ENCOUNTER — Other Ambulatory Visit: Payer: Self-pay

## 2020-03-04 DIAGNOSIS — K625 Hemorrhage of anus and rectum: Secondary | ICD-10-CM | POA: Insufficient documentation

## 2020-03-04 DIAGNOSIS — R52 Pain, unspecified: Secondary | ICD-10-CM | POA: Diagnosis not present

## 2020-03-04 DIAGNOSIS — R1031 Right lower quadrant pain: Secondary | ICD-10-CM | POA: Diagnosis not present

## 2020-03-04 DIAGNOSIS — M545 Low back pain: Secondary | ICD-10-CM | POA: Insufficient documentation

## 2020-03-04 DIAGNOSIS — Z5321 Procedure and treatment not carried out due to patient leaving prior to being seen by health care provider: Secondary | ICD-10-CM | POA: Insufficient documentation

## 2020-03-04 DIAGNOSIS — Z743 Need for continuous supervision: Secondary | ICD-10-CM | POA: Diagnosis not present

## 2020-03-04 DIAGNOSIS — R1011 Right upper quadrant pain: Secondary | ICD-10-CM | POA: Insufficient documentation

## 2020-03-04 DIAGNOSIS — R58 Hemorrhage, not elsewhere classified: Secondary | ICD-10-CM | POA: Diagnosis not present

## 2020-03-04 LAB — COMPREHENSIVE METABOLIC PANEL
ALT: 53 U/L — ABNORMAL HIGH (ref 0–44)
AST: 54 U/L — ABNORMAL HIGH (ref 15–41)
Albumin: 4 g/dL (ref 3.5–5.0)
Alkaline Phosphatase: 79 U/L (ref 38–126)
Anion gap: 14 (ref 5–15)
BUN: <5 mg/dL — ABNORMAL LOW (ref 6–20)
CO2: 19 mmol/L — ABNORMAL LOW (ref 22–32)
Calcium: 9.2 mg/dL (ref 8.9–10.3)
Chloride: 106 mmol/L (ref 98–111)
Creatinine, Ser: 0.67 mg/dL (ref 0.61–1.24)
GFR calc Af Amer: >60 mL/min (ref >60)
GFR calc non Af Amer: >60 mL/min (ref >60)
Glucose, Bld: 106 mg/dL — ABNORMAL HIGH (ref 70–99)
Potassium: 3.7 mmol/L (ref 3.5–5.1)
Sodium: 139 mmol/L (ref 135–145)
Total Bilirubin: 1.1 mg/dL (ref 0.3–1.2)
Total Protein: 7.6 g/dL (ref 6.5–8.1)

## 2020-03-04 LAB — TYPE AND SCREEN
ABO/RH(D): O POS
Antibody Screen: NEGATIVE

## 2020-03-04 LAB — CBC
HCT: 51.7 % (ref 39.0–52.0)
Hemoglobin: 17.6 g/dL — ABNORMAL HIGH (ref 13.0–17.0)
MCH: 33 pg (ref 26.0–34.0)
MCHC: 34 g/dL (ref 30.0–36.0)
MCV: 96.8 fL (ref 80.0–100.0)
Platelets: 128 10*3/uL — ABNORMAL LOW (ref 150–400)
RBC: 5.34 MIL/uL (ref 4.22–5.81)
RDW: 14.2 % (ref 11.5–15.5)
WBC: 8.2 10*3/uL (ref 4.0–10.5)
nRBC: 0 % (ref 0.0–0.2)

## 2020-03-04 LAB — HEPATITIS B SURFACE ANTIGEN: Hepatitis B Surface Ag: NONREACTIVE

## 2020-03-04 LAB — HEPATITIS C ANTIBODY
Hepatitis C Ab: NONREACTIVE
SIGNAL TO CUT-OFF: 0.03 (ref <1.00)

## 2020-03-04 MED ORDER — OXYCODONE-ACETAMINOPHEN 5-325 MG PO TABS
1.0000 | ORAL_TABLET | ORAL | Status: DC | PRN
  Administered 2020-03-04: 1 via ORAL
  Filled 2020-03-04: qty 1

## 2020-03-04 NOTE — ED Notes (Signed)
Called Pt for vitals no answer.
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
## 2020-03-05 ENCOUNTER — Encounter: Payer: Self-pay | Admitting: Internal Medicine

## 2020-03-11 DIAGNOSIS — J45909 Unspecified asthma, uncomplicated: Secondary | ICD-10-CM | POA: Diagnosis not present

## 2020-03-11 DIAGNOSIS — J449 Chronic obstructive pulmonary disease, unspecified: Secondary | ICD-10-CM | POA: Diagnosis not present

## 2020-03-17 ENCOUNTER — Encounter: Payer: Self-pay | Admitting: Internal Medicine

## 2020-03-26 ENCOUNTER — Other Ambulatory Visit: Payer: Self-pay | Admitting: Internal Medicine

## 2020-03-26 DIAGNOSIS — J301 Allergic rhinitis due to pollen: Secondary | ICD-10-CM

## 2020-04-02 ENCOUNTER — Other Ambulatory Visit: Payer: Self-pay | Admitting: *Deleted

## 2020-04-02 NOTE — Patient Outreach (Signed)
[facility]) Care Management  [place]  04/02/2020   JAKOBEE BRACKINS December 30, 1960 [id number]  RN Health Coach telephone call to patient.  [employer] compliance verified.  Patient A1C is 5.9. Fasting blood sugar is 98. Patient had recent fall 3 weeks ago. No injury. Patient has a me medical alert system. Per patient his cholesterol levels are elevated. Patient agreed to follow up outreach calls.  Encounter Medications:  Outpatient Encounter Medications as of 04/02/2020  Medication Sig  . ACCU-CHEK AVIVA PLUS test strip use to check blood sugar every morning  . acetaminophen (TYLENOL) 325 MG tablet Take 650 mg by mouth every 6 (six) hours as needed for headache.  . albuterol (PROAIR HFA) 108 (90 Base) MCG/ACT inhaler Inhale 2 puffs into the lungs every 4 (four) hours as needed for wheezing.  Marland Kitchen albuterol (PROVENTIL) (2.5 MG/3ML) 0.083% nebulizer solution inhale 3 milliliters (2.5 mg) by nebulization route every 6 hours as needed for 30 days  . ANORO ELLIPTA 62.5-25 MCG/INH AEPB Inhale 1 puff into the lungs daily.  . cariprazine (VRAYLAR) capsule Take 1 capsule (3 mg total) by mouth daily for 21 days.  Derrill Memo ON 04/07/2020] Cariprazine HCl (VRAYLAR) 4.5 MG CAPS Take 1 capsule (4.5 mg total) by mouth daily for 21 days.  . carvedilol (COREG) 12.5 MG tablet Take 1 tablet (12.5 mg total) by mouth 2 (two) times daily with a meal. Follow- appt due in June must see provider for future refills  . cetirizine (ZYRTEC) 10 MG tablet Take 1 tablet (10 mg total) by mouth daily.  Marland Kitchen dicyclomine (BENTYL) 20 MG tablet Take 1 tablet (20 mg total) by mouth 3 (three) times daily before meals.  Marland Kitchen esomeprazole (NEXIUM) 40 MG capsule Take 1 capsule (40 mg total) by mouth 2 (two) times daily before a meal.  . fluticasone (FLONASE) 50 MCG/ACT nasal spray Place 2 sprays into both nostrils daily.  . pravastatin (PRAVACHOL) 40 MG tablet Take 1 tablet (40 mg total) by mouth daily.  . sacubitril-valsartan  (ENTRESTO) 24-26 MG Take 1 tablet by mouth 2 (two) times daily.  Marland Kitchen thiamine 100 MG tablet Take 1 tablet (100 mg total) by mouth every other day.  . traZODone (DESYREL) 100 MG tablet Take 3 tablets (300 mg total) by mouth at bedtime.  . TRUVADA 200-300 MG tablet Take 1 tablet by mouth daily.   No facility-administered encounter medications on file as of 04/02/2020.    Functional Status:  In your present state of health, do you have any difficulty performing the following activities: 11/12/2019 06/28/2019  Hearing? N N  Vision? N N  Difficulty concentrating or making decisions? N N  Walking or climbing stairs? N N  Dressing or bathing? N N  Doing errands, shopping? N N  Preparing Food and eating ? N N  Using the Toilet? N N  In the past six months, have you accidently leaked urine? N N  Do you have problems with loss of bowel control? N N  Managing your Medications? N N  Managing your Finances? N N  Housekeeping or managing your Housekeeping? N N  Some encounter information is confidential and restricted. Go to Review Flowsheets activity to see all data.  Some recent data might be hidden    Fall/Depression Screening: Fall Risk  04/02/2020 11/12/2019 06/28/2019  Falls in the past year? 0 0 0  Number falls in past yr: 0 0 0  Injury with Fall? 0 0 0  Follow up Falls evaluation completed  Falls evaluation completed -   PHQ 2/9 Scores 10/10/2019 06/28/2019 06/24/2019 04/08/2018 03/26/2018 10/03/2017 12/04/2016  PHQ - 2 Score 1 6 4  0 1 2 0  PHQ- 9 Score 11 11 7  - 9 14 1    Goals Addressed            This Visit's Progress   . DIET - REDUCE FAT INTAKE       Patient will use his air fryer and bake foods more.  Patient will review educational material on low cholesterol diet     . Exercise 3x per week (30 min per time)      . Prevent falls       CARE PLAN ENTRY (see longitudinal plan of care for additional care plan information)  Current Barriers:  Marland Kitchen Knowledge Deficits related to fall  precautions . Decreased adherence to prescribed treatment for fall prevention . Knowledge Deficits related to self management of diabetes . Chronic Disease Management support and education needs related to hyperlipidemia and fall prevention  Clinical Goal(s):  Marland Kitchen Over the next 30 days, patient will demonstrate improved adherence to prescribed treatment plan for decreasing falls as evidenced by patient reporting and review of EMR . Over the next 30 days, patient will verbalize using fall risk reduction strategies discussed . Over the next 30 days, patient will not experience additional falls   Interventions:  . Provided written and verbal education re: Potential causes of falls and Fall prevention strategies . Reviewed medications and discussed potential side effects of medications such as dizziness and frequent urination . Assessed for s/s of orthostatic hypotension . Assessed for falls since last encounter. . Assessed patients knowledge of fall risk prevention secondary to previously provided education. . Assessed working status of life alert bracelet and patient adherence . Provided patient information for fall alert systems . Discussed plans with patient for ongoing care management follow up and provided patient with direct contact information for care management team . Provided patient with fall prevention educational materials related to falls  Patient Self Care Activities:  . Utilize walker (assistive device) appropriately with all ambulation . De-clutter walkways . Change positions slowly . Wear secure fitting shoes at all times with ambulation . Utilize home lighting for dim lit areas . Have self and pet awareness at all times  Plan:  . CM RN CM will follow up in the month of August . CM RN will follow up with any recent falls since last outreach       Assessment:  A1C 5.9 Blood sugar 98 Patient does not have an exercise routine Cholesterol levels are  elevated Patient had recent fall 3 weeks Patient has a medical alert system Plan:  RN discussed fall prevention RN discussed low fat low cholesterol diet RN sent education on lowfat low cholesterol diet RN sent educational material on fall prevention RN discussed medication adherence RN discussed keeping upcoming appointments RN sent update assessment to PCP RN will follow up outreach within the month of August  Aritzel [facility] [phone]

## 2020-04-03 ENCOUNTER — Other Ambulatory Visit: Payer: Self-pay | Admitting: Internal Medicine

## 2020-04-03 DIAGNOSIS — E118 Type 2 diabetes mellitus with unspecified complications: Secondary | ICD-10-CM

## 2020-04-04 ENCOUNTER — Encounter (HOSPITAL_COMMUNITY): Payer: Self-pay

## 2020-04-04 ENCOUNTER — Emergency Department (HOSPITAL_COMMUNITY)
Admission: EM | Admit: 2020-04-04 | Discharge: 2020-04-04 | Disposition: A | Discharge status: Home / Self Care | Payer: Medicare Other | Attending: Emergency Medicine | Admitting: Emergency Medicine

## 2020-04-04 ENCOUNTER — Other Ambulatory Visit: Payer: Self-pay

## 2020-04-04 DIAGNOSIS — J449 Chronic obstructive pulmonary disease, unspecified: Secondary | ICD-10-CM | POA: Insufficient documentation

## 2020-04-04 DIAGNOSIS — J441 Chronic obstructive pulmonary disease with (acute) exacerbation: Secondary | ICD-10-CM

## 2020-04-04 DIAGNOSIS — F17210 Nicotine dependence, cigarettes, uncomplicated: Secondary | ICD-10-CM | POA: Diagnosis not present

## 2020-04-04 DIAGNOSIS — F419 Anxiety disorder, unspecified: Secondary | ICD-10-CM | POA: Insufficient documentation

## 2020-04-04 DIAGNOSIS — Z85118 Personal history of other malignant neoplasm of bronchus and lung: Secondary | ICD-10-CM | POA: Insufficient documentation

## 2020-04-04 DIAGNOSIS — Z86010 Personal history of colonic polyps: Secondary | ICD-10-CM | POA: Insufficient documentation

## 2020-04-04 DIAGNOSIS — E118 Type 2 diabetes mellitus with unspecified complications: Secondary | ICD-10-CM | POA: Diagnosis not present

## 2020-04-04 DIAGNOSIS — J45909 Unspecified asthma, uncomplicated: Secondary | ICD-10-CM | POA: Insufficient documentation

## 2020-04-04 DIAGNOSIS — I509 Heart failure, unspecified: Secondary | ICD-10-CM | POA: Diagnosis not present

## 2020-04-04 DIAGNOSIS — R0602 Shortness of breath: Secondary | ICD-10-CM | POA: Diagnosis not present

## 2020-04-04 DIAGNOSIS — I2510 Atherosclerotic heart disease of native coronary artery without angina pectoris: Secondary | ICD-10-CM | POA: Insufficient documentation

## 2020-04-04 DIAGNOSIS — I110 Hypertensive heart disease with heart failure: Secondary | ICD-10-CM | POA: Diagnosis not present

## 2020-04-04 DIAGNOSIS — Z79899 Other long term (current) drug therapy: Secondary | ICD-10-CM | POA: Insufficient documentation

## 2020-04-04 DIAGNOSIS — F23 Brief psychotic disorder: Secondary | ICD-10-CM

## 2020-04-04 DIAGNOSIS — Z743 Need for continuous supervision: Secondary | ICD-10-CM | POA: Diagnosis not present
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
## 2020-04-04 NOTE — ED Notes (Signed)
Pt's belongings were placed in the cabinet labeled, "patient belongings [address]."

## 2020-04-04 NOTE — ED Triage Notes (Signed)
Pt states his "power is out and he is having suicidal thoughts. I have a cocktail at home to hurt himself".

## 2020-04-04 NOTE — ED Notes (Signed)
Pt throwing hands in air asking things to blow up. Pt yelling for aunt dora. Pt saying e wants to go home.

## 2020-04-04 NOTE — ED Notes (Signed)
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

## 2020-04-04 NOTE — Discharge Instructions (Addendum)
Continue your medications as previously prescribed.  Return to the emergency department for any new and/or concerning symptoms.

## 2020-04-07 IMAGING — CT CT ABDOMEN AND PELVIS WITH CONTRAST
2 of 5 series · 16 of 46 positions shown, 18 images · IV contrast (ISOVUE)
Comparison: PET-CT 08/21/2015, abdominal CT 12/04/2014

CLINICAL DATA: 57-year-old male with left-sided abdominal pain

EXAM:
CT ABDOMEN AND PELVIS WITH CONTRAST
TECHNIQUE: Multidetector CT imaging of the abdomen and pelvis was performed
using the standard protocol following bolus administration of
intravenous contrast.
CONTRAST:  100mL OMNIPAQUE IOHEXOL 300 MG/ML  SOLN

[Series 2: axial st · axial · 0.76mm/px · z∈[+1134,+1554]mm · 13 of 97 slices shown, 15 images]
[im 7/97  soft-tissue]
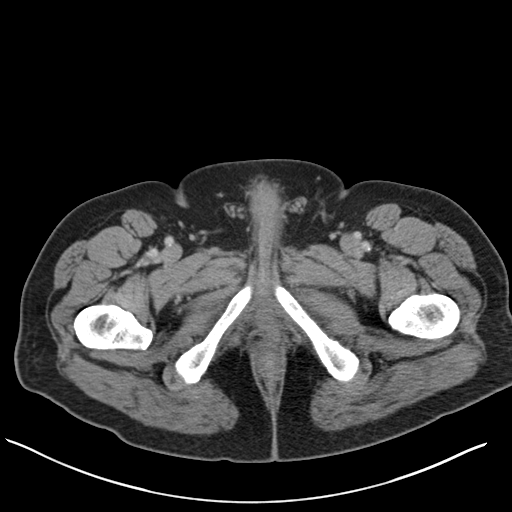
[im 7/97  bone]
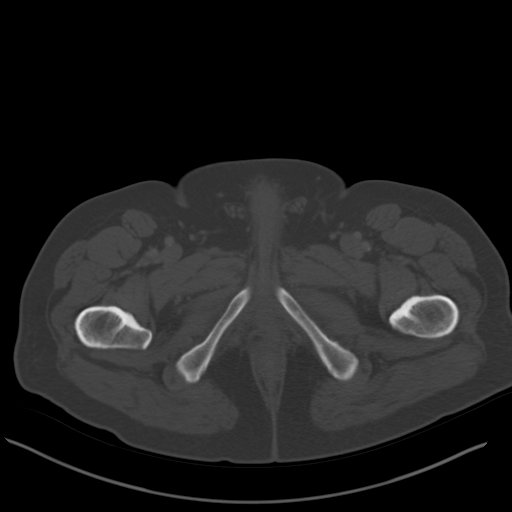
[im 13/97  soft-tissue]
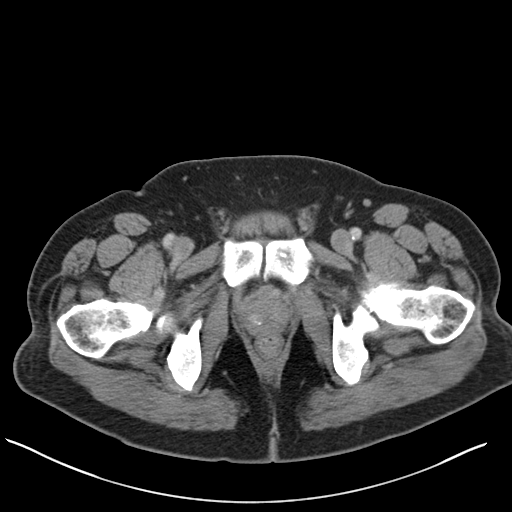
[im 19/97  soft-tissue]
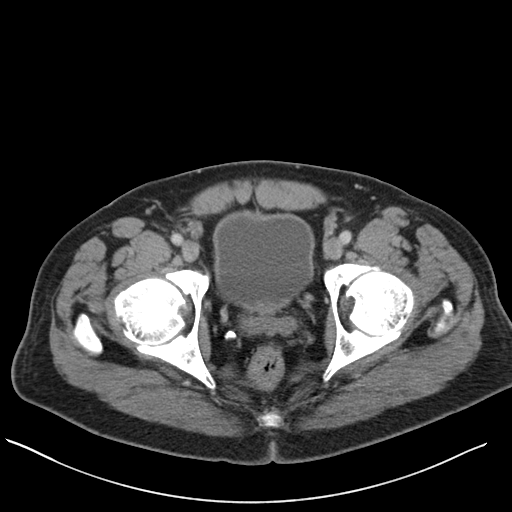
[im 31/97  soft-tissue]
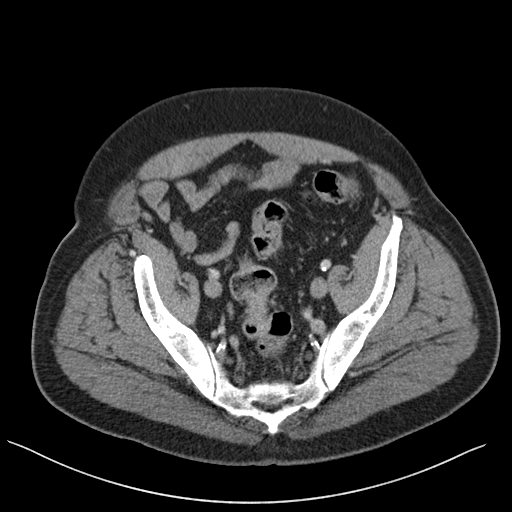
[im 37/97  soft-tissue]
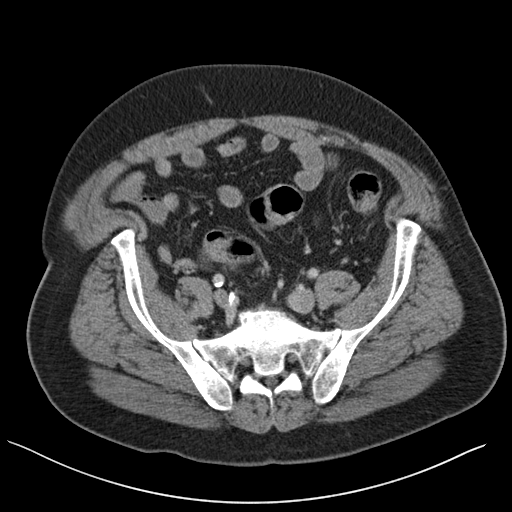
[im 43/97  soft-tissue]
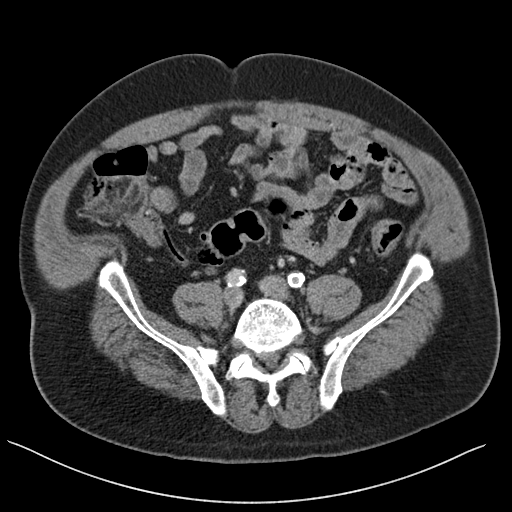
[im 49/97  soft-tissue]
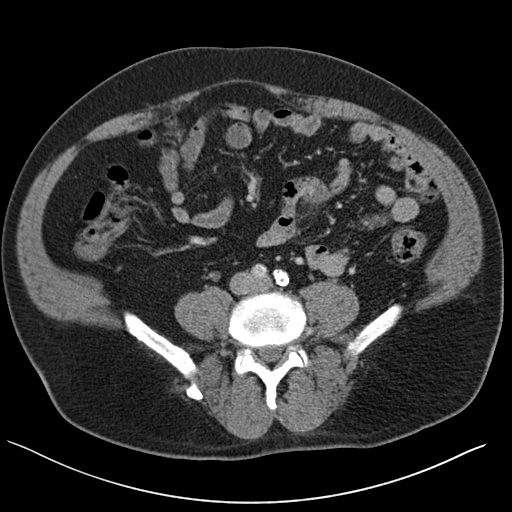
[im 55/97  soft-tissue]
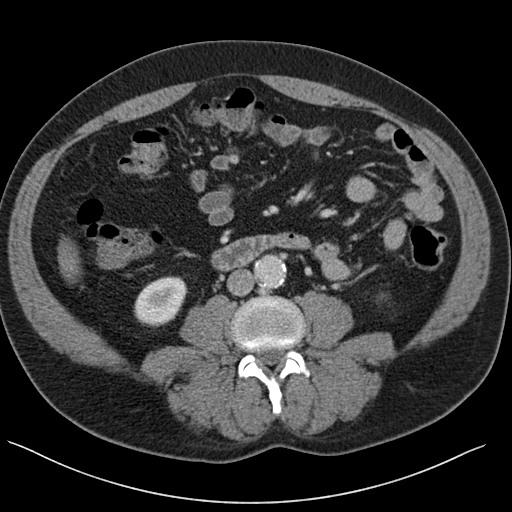
[im 61/97  soft-tissue]
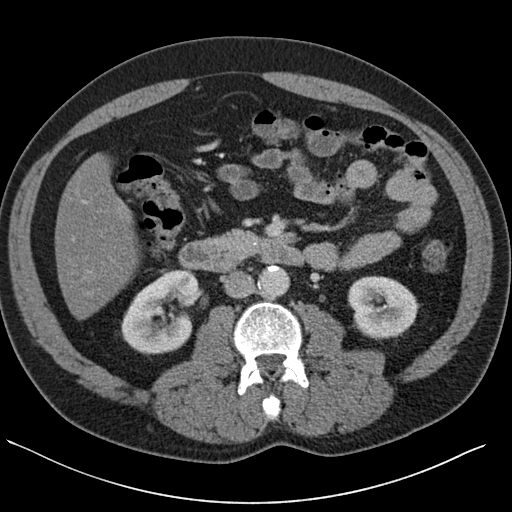
[im 61/97  bone]
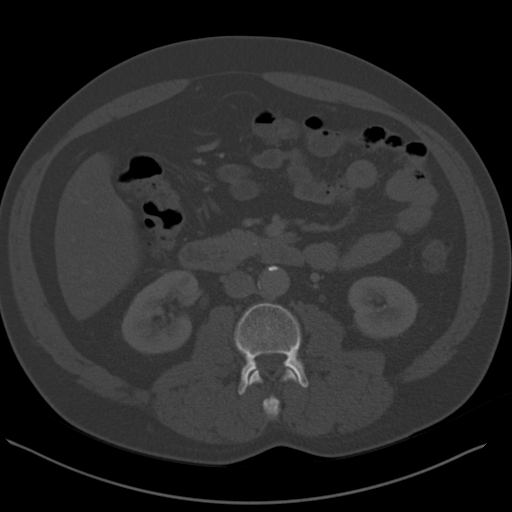
[im 67/97  soft-tissue]
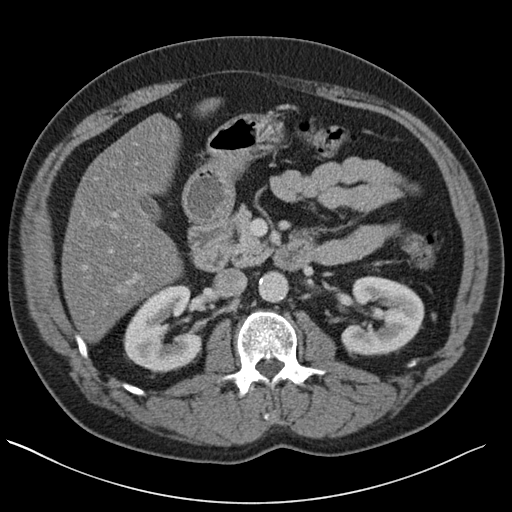
[im 79/97  soft-tissue]
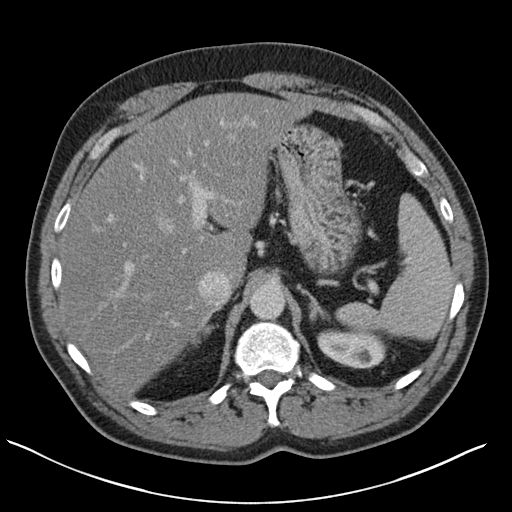
[im 85/97  soft-tissue]
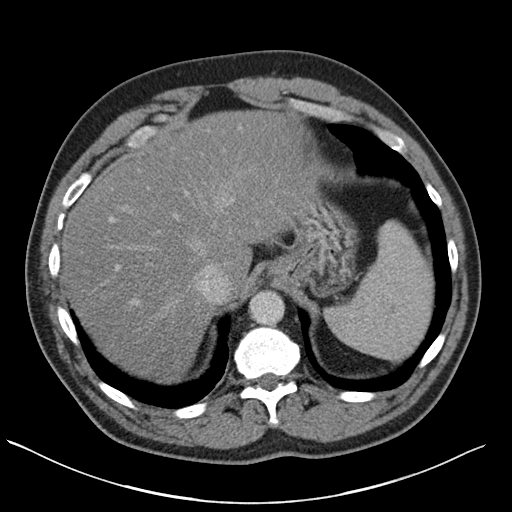
[im 91/97  soft-tissue]
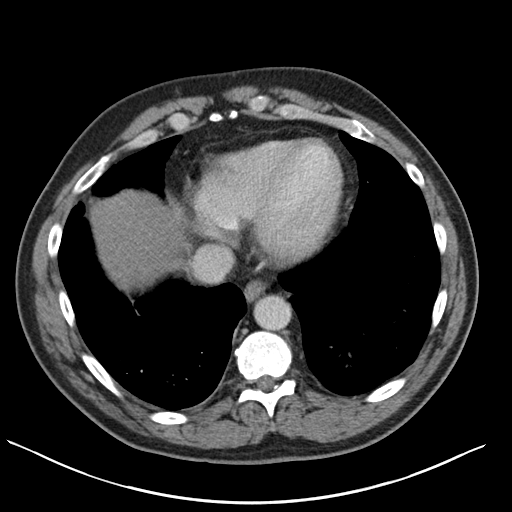

[Series 5: coronal st · coronal · 0.70mm/px · 3 of 151 slices shown]
[im 51/151  soft-tissue]
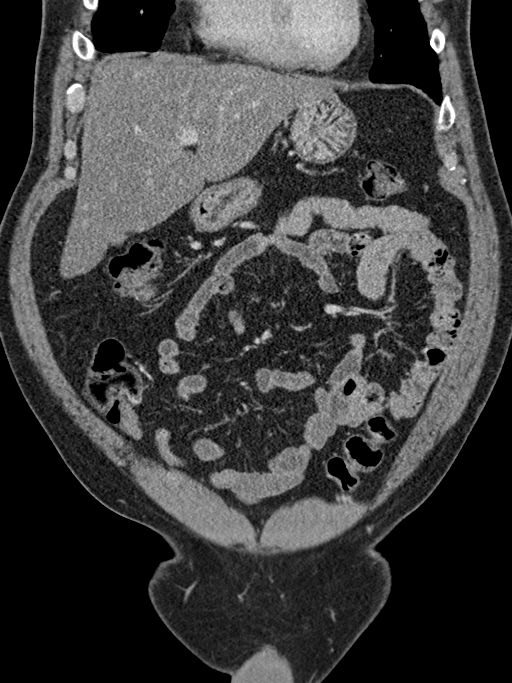
[im 67/151  soft-tissue]
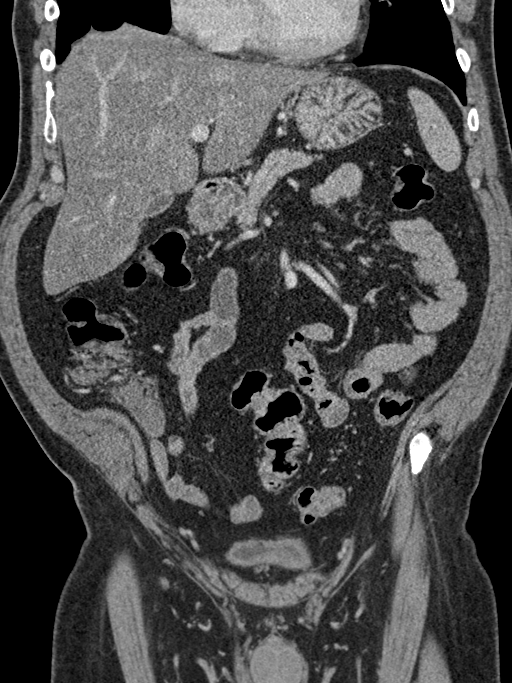
[im 84/151  soft-tissue]
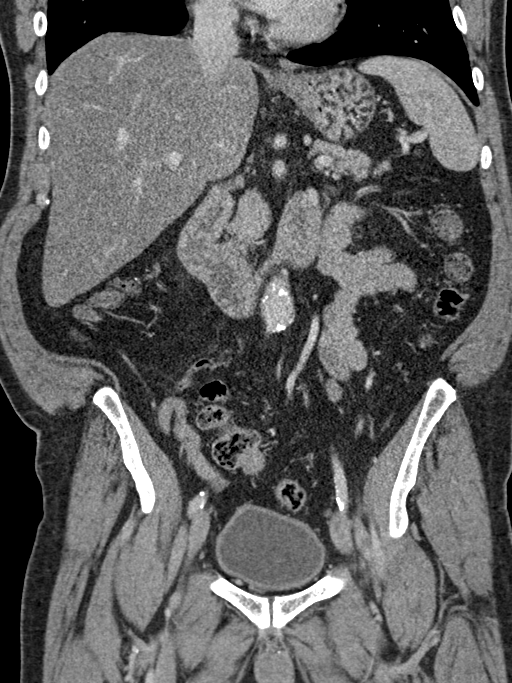

[16 of 46 positions shown; findings below may reference images not displayed]

FINDINGS: Lower chest: No acute abnormality.

Hepatobiliary: Diffusely decreased attenuation of liver parenchyma.
Gallbladder decompressed with no inflammatory changes.

Pancreas: Unremarkable

Spleen: Unremarkable

Adrenals/Urinary Tract: Unremarkable appearance of the adrenal
glands. No evidence of hydronephrosis of the right or left kidney.
No nephrolithiasis. Unremarkable course of the bilateral ureters.
Unremarkable appearance of the urinary bladder.

Stomach/Bowel: Unremarkable stomach. Unremarkable small bowel.
Normal appendix. No significant stool burden. No inflammatory
changes of the colon. Diverticular disease without acute
inflammatory change. No obstruction.

Vascular/Lymphatic: Atherosclerosis. Mesenteric arteries and renal
arteries are patent. Stenting of the left common iliac artery.
Bilateral external iliac arteries and proximal femoral arteries
patent. Unremarkable venous system.

No adenopathy. No inflammatory changes of the mesentery. No free
fluid.

Reproductive: Calcified changes of the prostate otherwise
unremarkable pelvic structures.

Other: Fat containing umbilical hernia.

Musculoskeletal: No acute displaced fracture. Degenerative changes
of the spine. A vascular necrosis of the left femoral head.
IMPRESSION: No acute CT finding.

Atherosclerosis.  Aortic Atherosclerosis ([id number]).

Ancillary findings as above.

## 2020-04-10 DIAGNOSIS — J449 Chronic obstructive pulmonary disease, unspecified: Secondary | ICD-10-CM | POA: Diagnosis not present

## 2020-04-10 DIAGNOSIS — J45909 Unspecified asthma, uncomplicated: Secondary | ICD-10-CM | POA: Diagnosis not present

## 2020-04-14 ENCOUNTER — Ambulatory Visit (HOSPITAL_COMMUNITY): Payer: Medicare Other | Admitting: Licensed Clinical Social Worker

## 2020-04-14 ENCOUNTER — Other Ambulatory Visit: Payer: Self-pay

## 2020-04-22 ENCOUNTER — Other Ambulatory Visit: Payer: Self-pay | Admitting: Internal Medicine

## 2020-04-22 DIAGNOSIS — J449 Chronic obstructive pulmonary disease, unspecified: Secondary | ICD-10-CM

## 2020-04-27 ENCOUNTER — Encounter: Payer: Self-pay | Admitting: Internal Medicine

## 2020-04-29 ENCOUNTER — Ambulatory Visit: Payer: Medicare Other | Admitting: Internal Medicine

## 2020-05-03 ENCOUNTER — Telehealth (INDEPENDENT_AMBULATORY_CARE_PROVIDER_SITE_OTHER): Payer: Medicare Other | Admitting: Adult Health

## 2020-05-03 ENCOUNTER — Other Ambulatory Visit: Payer: Self-pay

## 2020-05-03 ENCOUNTER — Encounter (HOSPITAL_COMMUNITY): Payer: Self-pay | Admitting: Adult Health

## 2020-05-03 VITALS — Ht 75.0 in | Wt 190.0 lb

## 2020-05-03 DIAGNOSIS — F332 Major depressive disorder, recurrent severe without psychotic features: Secondary | ICD-10-CM | POA: Diagnosis not present

## 2020-05-03 DIAGNOSIS — F411 Generalized anxiety disorder: Secondary | ICD-10-CM

## 2020-05-03 MED ORDER — GABAPENTIN 100 MG PO CAPS: 200.0000 mg | ORAL_CAPSULE | Freq: Two times a day (BID) | ORAL | 1 refills | Status: DC

## 2020-05-03 NOTE — Progress Notes (Addendum)
Virtual Visit via Telephone Note  I connected with [name] on 05/03/20 at  1:00 PM EDT by telephone and verified that I am speaking with the correct person using two identifiers.  Location: Patient: home Provider: virtual   I discussed the limitations, risks, security and privacy concerns of performing an evaluation and management service by telephone and the availability of in person appointments. I also discussed with the patient that there may be a patient responsible charge related to this service. The patient expressed understanding and agreed to proceed.   Subjective:   [name] is an 59 y.o. male who presents for evaluation and treatment of depressive symptoms.  Onset approximately 20 years ago, gradually worsening in the past month after he was seen in the ED for evaluation worsening anxiety related to audible and visual hallucinations of his deceased father.  States he was evaluated and discharged to follow-up with outpatient mental health services.  He reports a history of paranoid schizophrenia, historically diagnosed in 2004/2005.  States he has known he had schizophrenia during his young adult years but his mom was reluctant for him to seek care/treatment.  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  He denies marijuana or use of other illicit substances.  He reports history for prior suicide attempts and subsequent psychiatric inpatient hospitalization, most recent was 2004-05 period at [facility].  Today he request to have "xanax" to help with his anxiety.  States he does not enjoy being around people and tries to avoid social settings, "because I just don't like being around people".    Current symptoms include depressed mood, insomnia, feelings of worthlessness/guilt, difficulty concentrating, hopelessness, anxiety, disturbed sleep, decreased appetite,.  Current treatment for depression:Individual therapy,  Medication and patient currently taking trazodone 300mg  qhs.  Sleep problems:    Early awakening:ABSENT   Energy:  Motivation: Fair Concentration: Fair Rumination/worrying: MARKED Memory: POOR Tearfulness: ABSENT  Anxiety:MARKED  Panic: ABSENT  Overall Mood: Dysphoric Hopelessness: MARKED Suicidal ideation: ABSENT  Other/Psychosocial Stressors: family/relationship strain Family history positive for depression in the patient's unknown.  Previous treatment modalities employed include   Past episodes of depression: Organic causes of depression present: denies.  Review of Systems Behavioral/Psych: positive for anxiety, decreased appetite and depression   Objective:   During evaluation Barnet T Budai is alert/oriented x 4; calm/cooperative; and mood congruent with affect.  Patient is speaking in a clear tone at moderate volume, and normal pace.  His thought process is coherent and relevant; There is no indication that he is currently responding to internal/external stimuli or experiencing delusional thought content.  Patient denies suicidal/self-harm/homicidal ideation, psychosis, and paranoia.  Patient has remained calm throughout assessment and has answered questions appropriately.     Mental Status Examination: Posture and motor behavior: unable to assess via telephone Dress, grooming, personal hygiene: unable to assess via telephone visit Facial expression: Unable to assess via telephone Speech: Appropriate Mood: Euthymic, voice reflects appropriate inflection when talking about enjoyable activities such as "cooking". Coherency and relevance of thought: Appropriate Thought content: Appropriate Perceptions: Appropriate Orientation:Appropriate Attention and concentration: Appropriate Memory: : Appropriate Information: Not done Vocabulary: Appropriate Abstract reasoning: Appropriate Judgment: Appropriate    Assessment:   Experiencing the following symptoms of depression  most of the day nearly every day for more than two consecutive weeks: depressed mood, loss of interests/pleasure, change in sleep, change in appetite or weight, thoughts of worthlessness or guilt  Major Depressive Disorder  Generalized Anxiety Disorder  Suicide Risk Assessment: Suicidal intent: denies Suicidal plan: denies Access to means for suicide: denies Lethality of means for suicide: denies Prior suicide attempts: denies Recent exposure to suicide denies   Plan:   Severe episode of recurrent major depressive disorder, without psychotic features (HCC)  Generalized anxiety disorder /social anxiety  Recommend trial of gabapentin 200mg  BID with ability to upward titrate to achieve therapeutic response.  It is my thought that gabapentin can help with his anxiety disorder and off label act as adjunct to trazodone for his depressive symptoms as well.  He will follow-up in 2 weeks to assess to medication response.     Reviewed concept of depression as biochemical imbalance of neurotransmitters and rationale for treatment. Instructed patient to contact office or on-call physician promptly should condition worsen or any new symptoms appear and provided on-call telephone  numbers.    I discussed the assessment and treatment plan with the patient. The patient was provided an opportunity to ask questions and all were answered. The patient agreed with the plan and demonstrated an understanding of the instructions.   The patient was advised to call back or seek an in-person evaluation if the symptoms worsen or if the condition fails to improve as anticipated.  I provided 60 minutes of non-face-to-face time during this encounter.

## 2020-05-05 ENCOUNTER — Telehealth (HOSPITAL_COMMUNITY): Payer: Medicare Other

## 2020-05-11 DIAGNOSIS — J449 Chronic obstructive pulmonary disease, unspecified: Secondary | ICD-10-CM | POA: Diagnosis not present

## 2020-05-11 DIAGNOSIS — J45909 Unspecified asthma, uncomplicated: Secondary | ICD-10-CM | POA: Diagnosis not present

## 2020-05-12 ENCOUNTER — Ambulatory Visit (HOSPITAL_COMMUNITY): Payer: Medicare Other | Admitting: Licensed Clinical Social Worker

## 2020-05-12 ENCOUNTER — Other Ambulatory Visit: Payer: Self-pay

## 2020-05-12 ENCOUNTER — Telehealth (HOSPITAL_COMMUNITY): Payer: Self-pay | Admitting: Licensed Clinical Social Worker

## 2020-05-12 NOTE — Telephone Encounter (Signed)
LCSW sent link via text message for 11am video session. When pt did not sign on LCSW phoned him. The call went to vm. LCSW left vm re scheduled appt. Left phone # for him to reschedule as desired.

## 2020-05-14 DIAGNOSIS — J45909 Unspecified asthma, uncomplicated: Secondary | ICD-10-CM | POA: Diagnosis not present

## 2020-05-14 DIAGNOSIS — J449 Chronic obstructive pulmonary disease, unspecified: Secondary | ICD-10-CM | POA: Diagnosis not present

## 2020-05-17 ENCOUNTER — Other Ambulatory Visit (HOSPITAL_COMMUNITY): Payer: Self-pay

## 2020-05-17 ENCOUNTER — Encounter (HOSPITAL_COMMUNITY): Payer: Self-pay | Admitting: Pharmacy Technician

## 2020-05-17 ENCOUNTER — Emergency Department (HOSPITAL_COMMUNITY)
Admission: EM | Admit: 2020-05-17 | Discharge: 2020-05-18 | Disposition: A | Discharge status: Other Acute Inpatient Hospital | Payer: Medicare Other | Source: Home / Self Care | Attending: Emergency Medicine | Admitting: Emergency Medicine

## 2020-05-17 ENCOUNTER — Emergency Department (HOSPITAL_COMMUNITY): Payer: Medicare Other

## 2020-05-17 ENCOUNTER — Other Ambulatory Visit: Payer: Self-pay

## 2020-05-17 ENCOUNTER — Telehealth (HOSPITAL_COMMUNITY): Payer: Medicare Other | Admitting: Adult Health

## 2020-05-17 DIAGNOSIS — Z20822 Contact with and (suspected) exposure to covid-19: Secondary | ICD-10-CM | POA: Insufficient documentation

## 2020-05-17 DIAGNOSIS — R58 Hemorrhage, not elsewhere classified: Secondary | ICD-10-CM | POA: Diagnosis not present

## 2020-05-17 DIAGNOSIS — I110 Hypertensive heart disease with heart failure: Secondary | ICD-10-CM | POA: Insufficient documentation

## 2020-05-17 DIAGNOSIS — F1590 Other stimulant use, unspecified, uncomplicated: Secondary | ICD-10-CM | POA: Insufficient documentation

## 2020-05-17 DIAGNOSIS — M5489 Other dorsalgia: Secondary | ICD-10-CM | POA: Diagnosis not present

## 2020-05-17 DIAGNOSIS — Z7982 Long term (current) use of aspirin: Secondary | ICD-10-CM | POA: Insufficient documentation

## 2020-05-17 DIAGNOSIS — F10129 Alcohol abuse with intoxication, unspecified: Secondary | ICD-10-CM | POA: Insufficient documentation

## 2020-05-17 DIAGNOSIS — Y929 Unspecified place or not applicable: Secondary | ICD-10-CM | POA: Insufficient documentation

## 2020-05-17 DIAGNOSIS — S0181XA Laceration without foreign body of other part of head, initial encounter: Secondary | ICD-10-CM

## 2020-05-17 DIAGNOSIS — Y999 Unspecified external cause status: Secondary | ICD-10-CM | POA: Insufficient documentation

## 2020-05-17 DIAGNOSIS — R0902 Hypoxemia: Secondary | ICD-10-CM | POA: Diagnosis not present

## 2020-05-17 DIAGNOSIS — R45851 Suicidal ideations: Secondary | ICD-10-CM | POA: Insufficient documentation

## 2020-05-17 DIAGNOSIS — S199XXA Unspecified injury of neck, initial encounter: Secondary | ICD-10-CM | POA: Diagnosis not present

## 2020-05-17 DIAGNOSIS — F209 Schizophrenia, unspecified: Secondary | ICD-10-CM | POA: Diagnosis not present

## 2020-05-17 DIAGNOSIS — E119 Type 2 diabetes mellitus without complications: Secondary | ICD-10-CM | POA: Insufficient documentation

## 2020-05-17 DIAGNOSIS — J320 Chronic maxillary sinusitis: Secondary | ICD-10-CM | POA: Diagnosis not present

## 2020-05-17 DIAGNOSIS — F22 Delusional disorders: Secondary | ICD-10-CM

## 2020-05-17 DIAGNOSIS — J449 Chronic obstructive pulmonary disease, unspecified: Secondary | ICD-10-CM | POA: Insufficient documentation

## 2020-05-17 DIAGNOSIS — F333 Major depressive disorder, recurrent, severe with psychotic symptoms: Secondary | ICD-10-CM | POA: Insufficient documentation

## 2020-05-17 DIAGNOSIS — I509 Heart failure, unspecified: Secondary | ICD-10-CM | POA: Insufficient documentation

## 2020-05-17 DIAGNOSIS — R404 Transient alteration of awareness: Secondary | ICD-10-CM | POA: Diagnosis not present

## 2020-05-17 DIAGNOSIS — J45909 Unspecified asthma, uncomplicated: Secondary | ICD-10-CM | POA: Insufficient documentation

## 2020-05-17 DIAGNOSIS — W19XXXA Unspecified fall, initial encounter: Secondary | ICD-10-CM

## 2020-05-17 DIAGNOSIS — Y939 Activity, unspecified: Secondary | ICD-10-CM | POA: Insufficient documentation

## 2020-05-17 DIAGNOSIS — F10929 Alcohol use, unspecified with intoxication, unspecified: Secondary | ICD-10-CM

## 2020-05-17 DIAGNOSIS — Z743 Need for continuous supervision: Secondary | ICD-10-CM | POA: Diagnosis not present

## 2020-05-17 DIAGNOSIS — Z9889 Other specified postprocedural states: Secondary | ICD-10-CM | POA: Diagnosis not present

## 2020-05-17 DIAGNOSIS — J3489 Other specified disorders of nose and nasal sinuses: Secondary | ICD-10-CM | POA: Diagnosis not present

## 2020-05-17 DIAGNOSIS — S0990XA Unspecified injury of head, initial encounter: Secondary | ICD-10-CM | POA: Diagnosis not present

## 2020-05-17 DIAGNOSIS — F17210 Nicotine dependence, cigarettes, uncomplicated: Secondary | ICD-10-CM | POA: Insufficient documentation

## 2020-05-17 DIAGNOSIS — S3993XA Unspecified injury of pelvis, initial encounter: Secondary | ICD-10-CM | POA: Diagnosis not present

## 2020-05-17 LAB — COMPREHENSIVE METABOLIC PANEL
ALT: 44 U/L (ref 0–44)
AST: 50 U/L — ABNORMAL HIGH (ref 15–41)
Albumin: 3.5 g/dL (ref 3.5–5.0)
Alkaline Phosphatase: 60 U/L (ref 38–126)
Anion gap: 12 (ref 5–15)
BUN: <5 mg/dL — ABNORMAL LOW (ref 6–20)
CO2: 20 mmol/L — ABNORMAL LOW (ref 22–32)
Calcium: 8.8 mg/dL — ABNORMAL LOW (ref 8.9–10.3)
Chloride: 109 mmol/L (ref 98–111)
Creatinine, Ser: 0.74 mg/dL (ref 0.61–1.24)
GFR calc Af Amer: >60 mL/min (ref >60)
GFR calc non Af Amer: >60 mL/min (ref >60)
Glucose, Bld: 113 mg/dL — ABNORMAL HIGH (ref 70–99)
Potassium: 4.2 mmol/L (ref 3.5–5.1)
Sodium: 141 mmol/L (ref 135–145)
Total Bilirubin: 0.6 mg/dL (ref 0.3–1.2)
Total Protein: 6.9 g/dL (ref 6.5–8.1)

## 2020-05-17 LAB — URINALYSIS, ROUTINE W REFLEX MICROSCOPIC
Bacteria, UA: NONE SEEN
Bilirubin Urine: NEGATIVE
Glucose, UA: NEGATIVE mg/dL
Ketones, ur: NEGATIVE mg/dL
Leukocytes,Ua: NEGATIVE
Nitrite: NEGATIVE
Protein, ur: NEGATIVE mg/dL
Specific Gravity, Urine: 1.002 — ABNORMAL LOW (ref 1.005–1.030)
pH: 5 (ref 5.0–8.0)

## 2020-05-17 LAB — CK TOTAL AND CKMB (NOT AT ARMC)
CK, MB: 1.7 ng/mL (ref 0.5–5.0)
Relative Index: INVALID (ref 0.0–2.5)
Total CK: 90 U/L (ref 49–397)

## 2020-05-17 LAB — CBC
HCT: 48.2 % (ref 39.0–52.0)
Hemoglobin: 16.3 g/dL (ref 13.0–17.0)
MCH: 33.7 pg (ref 26.0–34.0)
MCHC: 33.8 g/dL (ref 30.0–36.0)
MCV: 99.8 fL (ref 80.0–100.0)
Platelets: 122 10*3/uL — ABNORMAL LOW (ref 150–400)
RBC: 4.83 MIL/uL (ref 4.22–5.81)
RDW: 13.9 % (ref 11.5–15.5)
WBC: 7.1 10*3/uL (ref 4.0–10.5)
nRBC: 0 % (ref 0.0–0.2)

## 2020-05-17 LAB — RAPID URINE DRUG SCREEN, HOSP PERFORMED
Amphetamines: NOT DETECTED
Barbiturates: NOT DETECTED
Benzodiazepines: NOT DETECTED
Cocaine: NOT DETECTED
Opiates: NOT DETECTED
Tetrahydrocannabinol: NOT DETECTED

## 2020-05-17 LAB — SARS CORONAVIRUS 2 BY RT PCR (HOSPITAL ORDER, PERFORMED IN ~~LOC~~ HOSPITAL LAB): SARS Coronavirus 2: NEGATIVE

## 2020-05-17 LAB — LACTIC ACID, PLASMA
Lactic Acid, Venous: 2 mmol/L (ref 0.5–1.9)
Lactic Acid, Venous: 2.1 mmol/L (ref 0.5–1.9)
Lactic Acid, Venous: 1.8 mmol/L (ref 0.5–1.9)

## 2020-05-17 LAB — ACETAMINOPHEN LEVEL: Acetaminophen (Tylenol), Serum: <10 ug/mL — ABNORMAL LOW (ref 10–30)

## 2020-05-17 LAB — SAMPLE TO BLOOD BANK

## 2020-05-17 LAB — PROTIME-INR
INR: 0.9 (ref 0.8–1.2)
Prothrombin Time: 12.1 seconds (ref 11.4–15.2)

## 2020-05-17 LAB — SALICYLATE LEVEL: Salicylate Lvl: <7 mg/dL — ABNORMAL LOW (ref 7.0–30.0)

## 2020-05-17 LAB — ETHANOL: Alcohol, Ethyl (B): 311 mg/dL (ref <10)

## 2020-05-17 MED ORDER — NICOTINE 21 MG/24HR TD PT24
21.0000 mg | MEDICATED_PATCH | Freq: Once | TRANSDERMAL | Status: DC
  Administered 2020-05-17: 21 mg via TRANSDERMAL
  Filled 2020-05-17: qty 1

## 2020-05-17 MED ORDER — LORAZEPAM 1 MG PO TABS
0.0000 mg | ORAL_TABLET | Freq: Four times a day (QID) | ORAL | Status: DC
  Administered 2020-05-17: 2 mg via ORAL
  Filled 2020-05-17: qty 2

## 2020-05-17 MED ORDER — SODIUM CHLORIDE 0.9 % IV BOLUS: 500.0000 mL | Freq: Once | INTRAVENOUS | Status: DC

## 2020-05-17 MED ORDER — SODIUM CHLORIDE 0.9 % IV BOLUS
1000.0000 mL | Freq: Once | INTRAVENOUS | Status: AC
  Administered 2020-05-17: 1000 mL via INTRAVENOUS

## 2020-05-17 MED ORDER — THIAMINE HCL 100 MG/ML IJ SOLN: 100.0000 mg | Freq: Every day | INTRAMUSCULAR | Status: DC

## 2020-05-17 MED ORDER — THIAMINE HCL 100 MG PO TABS
100.0000 mg | ORAL_TABLET | Freq: Every day | ORAL | Status: DC
  Administered 2020-05-17: 100 mg via ORAL
  Filled 2020-05-17: qty 1

## 2020-05-17 MED ORDER — LORAZEPAM 2 MG/ML IJ SOLN: 0.0000 mg | Freq: Two times a day (BID) | INTRAMUSCULAR | Status: DC

## 2020-05-17 MED ORDER — LORAZEPAM 1 MG PO TABS: 0.0000 mg | ORAL_TABLET | Freq: Two times a day (BID) | ORAL | Status: DC

## 2020-05-17 MED ORDER — LORAZEPAM 2 MG/ML IJ SOLN: 0.0000 mg | Freq: Four times a day (QID) | INTRAMUSCULAR | Status: DC
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
## 2020-05-17 NOTE — ED Notes (Signed)
Pt was wanded by [employer], Valuables placed in Envelope [phone]. Other belongings were inventoried and placed in [place]. Pt agreeable and signed all necessary documentation.

## 2020-05-17 NOTE — ED Notes (Signed)
Pt changed into [facility] [employer] Hold Scrub Top. Pt requested to keep Basketball Shorts on; RN allowed this since there are no strings or attachments.
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
## 2020-05-18 ENCOUNTER — Other Ambulatory Visit: Payer: Self-pay

## 2020-05-18 ENCOUNTER — Encounter (HOSPITAL_COMMUNITY): Payer: Self-pay

## 2020-05-18 ENCOUNTER — Inpatient Hospital Stay (HOSPITAL_COMMUNITY)
Admission: AD | Admit: 2020-05-18 | Discharge: 2020-05-19 | DRG: 885 | Disposition: A | Discharge status: Home / Self Care | Payer: Medicare Other | Source: Intra-hospital | Attending: Psychiatry | Admitting: Psychiatry

## 2020-05-18 DIAGNOSIS — F418 Other specified anxiety disorders: Secondary | ICD-10-CM

## 2020-05-18 DIAGNOSIS — I739 Peripheral vascular disease, unspecified: Secondary | ICD-10-CM

## 2020-05-18 DIAGNOSIS — F209 Schizophrenia, unspecified: Principal | ICD-10-CM | POA: Diagnosis present

## 2020-05-18 DIAGNOSIS — K580 Irritable bowel syndrome with diarrhea: Secondary | ICD-10-CM | POA: Diagnosis present

## 2020-05-18 DIAGNOSIS — F1994 Other psychoactive substance use, unspecified with psychoactive substance-induced mood disorder: Secondary | ICD-10-CM

## 2020-05-18 DIAGNOSIS — F17210 Nicotine dependence, cigarettes, uncomplicated: Secondary | ICD-10-CM | POA: Diagnosis present

## 2020-05-18 DIAGNOSIS — F10230 Alcohol dependence with withdrawal, uncomplicated: Secondary | ICD-10-CM

## 2020-05-18 DIAGNOSIS — F10229 Alcohol dependence with intoxication, unspecified: Secondary | ICD-10-CM | POA: Diagnosis present

## 2020-05-18 DIAGNOSIS — F4010 Social phobia, unspecified: Secondary | ICD-10-CM | POA: Diagnosis present

## 2020-05-18 DIAGNOSIS — Z915 Personal history of self-harm: Secondary | ICD-10-CM

## 2020-05-18 DIAGNOSIS — F1024 Alcohol dependence with alcohol-induced mood disorder: Secondary | ICD-10-CM

## 2020-05-18 DIAGNOSIS — I110 Hypertensive heart disease with heart failure: Secondary | ICD-10-CM | POA: Diagnosis present

## 2020-05-18 DIAGNOSIS — J449 Chronic obstructive pulmonary disease, unspecified: Secondary | ICD-10-CM | POA: Diagnosis present

## 2020-05-18 DIAGNOSIS — Z21 Asymptomatic human immunodeficiency virus [HIV] infection status: Secondary | ICD-10-CM | POA: Diagnosis present

## 2020-05-18 DIAGNOSIS — Y908 Blood alcohol level of 240 mg/100 ml or more: Secondary | ICD-10-CM | POA: Diagnosis present

## 2020-05-18 DIAGNOSIS — S01112A Laceration without foreign body of left eyelid and periocular area, initial encounter: Secondary | ICD-10-CM | POA: Diagnosis present

## 2020-05-18 DIAGNOSIS — E785 Hyperlipidemia, unspecified: Secondary | ICD-10-CM

## 2020-05-18 DIAGNOSIS — F200 Paranoid schizophrenia: Secondary | ICD-10-CM

## 2020-05-18 DIAGNOSIS — I509 Heart failure, unspecified: Secondary | ICD-10-CM | POA: Diagnosis present

## 2020-05-18 DIAGNOSIS — Z20822 Contact with and (suspected) exposure to covid-19: Secondary | ICD-10-CM | POA: Diagnosis present

## 2020-05-18 DIAGNOSIS — G4700 Insomnia, unspecified: Secondary | ICD-10-CM

## 2020-05-18 DIAGNOSIS — R45851 Suicidal ideations: Secondary | ICD-10-CM | POA: Diagnosis present

## 2020-05-18 MED ORDER — DICYCLOMINE HCL 20 MG PO TABS
20.0000 mg | ORAL_TABLET | Freq: Three times a day (TID) | ORAL | Status: DC
  Administered 2020-05-18 – 2020-05-19 (×3): 20 mg via ORAL
  Filled 2020-05-18 (×10): qty 1

## 2020-05-18 MED ORDER — THIAMINE HCL 100 MG PO TABS: 100.0000 mg | ORAL_TABLET | Freq: Every day | ORAL | Status: DC

## 2020-05-18 MED ORDER — LORAZEPAM 1 MG PO TABS
1.0000 mg | ORAL_TABLET | Freq: Four times a day (QID) | ORAL | Status: DC
  Administered 2020-05-18: 1 mg via ORAL
  Filled 2020-05-18: qty 1

## 2020-05-18 MED ORDER — CARIPRAZINE HCL 3 MG PO CAPS
3.0000 mg | ORAL_CAPSULE | Freq: Every day | ORAL | Status: DC
  Administered 2020-05-18 – 2020-05-19 (×2): 3 mg via ORAL
  Filled 2020-05-18 (×4): qty 1

## 2020-05-18 MED ORDER — SACUBITRIL-VALSARTAN 24-26 MG PO TABS
1.0000 | ORAL_TABLET | Freq: Two times a day (BID) | ORAL | Status: DC
  Administered 2020-05-18 – 2020-05-19 (×2): 1 via ORAL
  Filled 2020-05-18 (×4): qty 1

## 2020-05-18 MED ORDER — UMECLIDINIUM BROMIDE 62.5 MCG/INH IN AEPB
1.0000 | INHALATION_SPRAY | Freq: Every day | RESPIRATORY_TRACT | Status: DC
  Filled 2020-05-18: qty 7

## 2020-05-18 MED ORDER — ACETAMINOPHEN 325 MG PO TABS: 650.0000 mg | ORAL_TABLET | Freq: Four times a day (QID) | ORAL | Status: DC | PRN

## 2020-05-18 MED ORDER — ONDANSETRON 4 MG PO TBDP: 4.0000 mg | ORAL_TABLET | Freq: Four times a day (QID) | ORAL | Status: DC | PRN

## 2020-05-18 MED ORDER — MAGNESIUM HYDROXIDE 400 MG/5ML PO SUSP: 30.0000 mL | Freq: Every day | ORAL | Status: DC | PRN

## 2020-05-18 MED ORDER — TRAZODONE HCL 150 MG PO TABS
300.0000 mg | ORAL_TABLET | Freq: Every day | ORAL | Status: DC
  Administered 2020-05-18: 300 mg via ORAL
  Filled 2020-05-18 (×3): qty 2

## 2020-05-18 MED ORDER — LORAZEPAM 1 MG PO TABS: 1.0000 mg | ORAL_TABLET | Freq: Two times a day (BID) | ORAL | Status: DC

## 2020-05-18 MED ORDER — LORAZEPAM 1 MG PO TABS: 1.0000 mg | ORAL_TABLET | Freq: Every day | ORAL | Status: DC

## 2020-05-18 MED ORDER — EMTRICITABINE-TENOFOVIR AF 200-25 MG PO TABS
1.0000 | ORAL_TABLET | Freq: Every day | ORAL | Status: DC
  Administered 2020-05-18 – 2020-05-19 (×2): 1 via ORAL
  Filled 2020-05-18 (×4): qty 1

## 2020-05-18 MED ORDER — TRAZODONE HCL 50 MG PO TABS: 50.0000 mg | ORAL_TABLET | Freq: Every evening | ORAL | Status: DC | PRN

## 2020-05-18 MED ORDER — THIAMINE HCL 100 MG/ML IJ SOLN
100.0000 mg | Freq: Once | INTRAMUSCULAR | Status: AC
  Administered 2020-05-18: 100 mg via INTRAMUSCULAR
  Filled 2020-05-18: qty 2

## 2020-05-18 MED ORDER — LORAZEPAM 1 MG PO TABS: 1.0000 mg | ORAL_TABLET | Freq: Three times a day (TID) | ORAL | Status: DC

## 2020-05-18 MED ORDER — THIAMINE HCL 100 MG PO TABS
100.0000 mg | ORAL_TABLET | Freq: Every day | ORAL | Status: DC
  Administered 2020-05-19: 100 mg via ORAL
  Filled 2020-05-18 (×2): qty 1

## 2020-05-18 MED ORDER — LOPERAMIDE HCL 2 MG PO CAPS
2.0000 mg | ORAL_CAPSULE | ORAL | Status: DC | PRN
  Administered 2020-05-18: 2 mg via ORAL
  Filled 2020-05-18: qty 1

## 2020-05-18 MED ORDER — LORAZEPAM 1 MG PO TABS: 1.0000 mg | ORAL_TABLET | Freq: Four times a day (QID) | ORAL | Status: DC | PRN

## 2020-05-18 MED ORDER — GABAPENTIN 100 MG PO CAPS
200.0000 mg | ORAL_CAPSULE | Freq: Two times a day (BID) | ORAL | Status: DC
  Administered 2020-05-18 – 2020-05-19 (×2): 200 mg via ORAL
  Filled 2020-05-18 (×5): qty 2

## 2020-05-18 MED ORDER — NICOTINE 21 MG/24HR TD PT24
21.0000 mg | MEDICATED_PATCH | Freq: Every day | TRANSDERMAL | Status: DC
  Administered 2020-05-19: 21 mg via TRANSDERMAL
  Filled 2020-05-18 (×3): qty 1

## 2020-05-18 MED ORDER — PRAVASTATIN SODIUM 40 MG PO TABS
40.0000 mg | ORAL_TABLET | Freq: Every day | ORAL | Status: DC
  Administered 2020-05-18: 40 mg via ORAL
  Filled 2020-05-18 (×2): qty 1

## 2020-05-18 MED ORDER — HYDROXYZINE HCL 25 MG PO TABS
25.0000 mg | ORAL_TABLET | Freq: Four times a day (QID) | ORAL | Status: DC | PRN
  Administered 2020-05-18: 25 mg via ORAL

## 2020-05-18 MED ORDER — CARVEDILOL 6.25 MG PO TABS
6.2500 mg | ORAL_TABLET | Freq: Two times a day (BID) | ORAL | Status: DC
  Administered 2020-05-18 – 2020-05-19 (×2): 6.25 mg via ORAL
  Filled 2020-05-18 (×4): qty 1

## 2020-05-18 MED ORDER — UMECLIDINIUM-VILANTEROL 62.5-25 MCG/INH IN AEPB
1.0000 | INHALATION_SPRAY | Freq: Every day | RESPIRATORY_TRACT | Status: DC
  Filled 2020-05-18: qty 14

## 2020-05-18 MED ORDER — ALBUTEROL SULFATE HFA 108 (90 BASE) MCG/ACT IN AERS: 1.0000 | INHALATION_SPRAY | Freq: Four times a day (QID) | RESPIRATORY_TRACT | Status: DC | PRN

## 2020-05-18 MED ORDER — ALUM & MAG HYDROXIDE-SIMETH 200-200-20 MG/5ML PO SUSP: 30.0000 mL | ORAL | Status: DC | PRN

## 2020-05-18 MED ORDER — ADULT MULTIVITAMIN W/MINERALS CH
1.0000 | ORAL_TABLET | Freq: Every day | ORAL | Status: DC
  Administered 2020-05-18 – 2020-05-19 (×2): 1 via ORAL
  Filled 2020-05-18 (×3): qty 1

## 2020-05-18 NOTE — BHH Suicide Risk Assessment (Signed)
[facility] Admission Suicide Risk Assessment   Nursing information obtained from:  Patient Demographic factors:  Male, Living alone, Unemployed, Low socioeconomic status Current Mental Status:  NA Loss Factors:  Financial problems / change in socioeconomic status Historical Factors:  Impulsivity Risk Reduction Factors:  NA  Total Time spent with patient: 45 minutes Principal Problem: <principal problem not specified> Diagnosis:  Active Problems:   Schizophrenia (HCC)  Subjective Data: Patient is seen and examined.  Patient is a 59 year old male who presented to the [facility] emergency department via EMS after having been found lying down in the parking lot with a laceration to his left eye.  His blood alcohol on admission was 311.  He reports a history of depression.  He stated that in the emergency department that "I feel like committing suicide".  He had plan to ingest a cocktail of medications.  He has attempted suicide in the past.  He reported auditory hallucinations.  He was admitted to the hospital for evaluation and stabilization.  On examination today the patient denies suicidal ideation.  He stated he wants to be able to get home to take care of his cat.  He admitted to drinking excessively.  He remembers having fallen, and having his head sutured.  He has a past medical history significant for HIV as well as COPD, congestive heart failure.  He stated that he takes trazodone 300 mg p.o. nightly and cannot sleep without it, and that his primary care provider had started him on Vraylar 3 mg p.o. daily for the auditory hallucinations.  He denied any seizures in the past, but he is tremulous.  He was admitted to the hospital for evaluation and stabilization.  Continued Clinical Symptoms:  Alcohol Use Disorder Identification Test Final Score (AUDIT): 36 The "Alcohol Use Disorders Identification Test", Guidelines for Use in Primary Care, Second Edition.  World Pharmacologist [facility]). Score  between 0-7:  no or low risk or alcohol related problems. Score between 8-15:  moderate risk of alcohol related problems. Score between 16-19:  high risk of alcohol related problems. Score 20 or above:  warrants further diagnostic evaluation for alcohol dependence and treatment.   CLINICAL FACTORS:   Depression:   Anhedonia Comorbid alcohol abuse/dependence Hopelessness Impulsivity Insomnia Alcohol/Substance Abuse/Dependencies   Musculoskeletal: Strength & Muscle Tone: decreased Gait & Station: unsteady Patient leans: N/A  Psychiatric Specialty Exam: Physical Exam Vitals and nursing note reviewed.  HENT:     Head: Normocephalic and atraumatic.  Pulmonary:     Effort: Pulmonary effort is normal.  Neurological:     General: No focal deficit present.     Mental Status: He is alert and oriented to person, place, and time.     Review of Systems  Blood pressure 123/82, pulse 83, temperature 98.2 F (36.8 C), temperature source Oral, resp. rate 18, height 6\' 3"  (1.905 m), weight 83.9 kg, SpO2 100 %.Body mass index is 23.12 kg/m.  General Appearance: Disheveled  Eye Contact:  Fair  Speech:  Normal Rate  Volume:  Decreased  Mood:  Dysphoric  Affect:  Congruent  Thought Process:  Coherent and Descriptions of Associations: Circumstantial  Orientation:  Full (Time, Place, and Person)  Thought Content:  Hallucinations: Auditory  Suicidal Thoughts:  No  Homicidal Thoughts:  No  Memory:  Immediate;   Fair Recent;   Fair Remote;   Fair  Judgement:  Impaired  Insight:  Lacking  Psychomotor Activity:  Increased  Concentration:  Concentration: Fair and Attention Span: Fair  Recall:  Smiley Houseman of Knowledge:  Fair  Language:  Fair  Akathisia:  Negative  Handed:  Right  AIMS (if indicated):     Assets:  Desire for Improvement Resilience  ADL's:  Intact  Cognition:  WNL  Sleep:         COGNITIVE FEATURES THAT CONTRIBUTE TO RISK:  None    SUICIDE RISK:   Mild:   Suicidal ideation of limited frequency, intensity, duration, and specificity.  There are no identifiable plans, no associated intent, mild dysphoria and related symptoms, good self-control (both objective and subjective assessment), few other risk factors, and identifiable protective factors, including available and accessible social support.  PLAN OF CARE: Patient is seen and examined.  Patient is a 59 year old male with the above-stated past psychiatric history who was admitted secondary to suicidal ideation, alcohol intoxication/alcohol withdrawal and auditory hallucinations.  He will be admitted to the hospital.  He will be integrated in the milieu.  He will be encouraged to attend groups.  He will be placed on lorazepam 1 mg p.o. every 6 hours as needed a CIWA greater than 10.  We will also continue his Vraylar 3 mg p.o. nightly and his trazodone at 300 mg p.o. nightly.  He is on HIV medications and these will be continued.  His COPD medications will be continued.  His congestive heart failure medications will be continued.  Review of his admission laboratories showed a creatinine of 0.74, an AST of 50 and an ALT of 44.  His CBC showed essentially normal numbers except for his platelets at 122,000.  2 months ago it was 128,000.  PT was 12.1, INR was 0.9.  Acetaminophen was less than 10, salicylate less than 7.  His urinalysis was essentially normal.  Blood alcohol was 311.  Drug screen was negative.  His CT scan of the cervical spine as well as head was essentially normal.  Chest x-ray showed no acute cardiopulmonary disease.  There is status post right partial lung resection.  His EKG showed a sinus tachycardia with a QTc interval of 488.  I certify that inpatient services furnished can reasonably be expected to improve the patient's condition.   Sharma Covert, MD 05/18/2020, 4:51 PM

## 2020-05-18 NOTE — Plan of Care (Signed)
Nurse discussed anxiety, depression and coping skills with patient.  
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
## 2020-05-18 NOTE — Progress Notes (Signed)
Recreation Therapy Notes  INPATIENT RECREATION THERAPY ASSESSMENT  Patient Details Name: [name] MRN: [id number] DOB: [birth year] Today's Date: 05/18/2020       Information Obtained From: Patient  Able to Participate in Assessment/Interview: Yes  Patient Presentation: Alert, Anxious  Reason for Admission (Per Patient): Other (Comments) (Pt stated he passed out, fell and bumped his head.)  Patient Stressors: Other (Comment) (Pt stated he is stresssed all the time.)  Coping Skills:   Isolation, TV, Music, Exercise, Deep Breathing, Substance Abuse, Talk, Prayer, Avoidance, Read  Leisure Interests (2+):  Individual - TV, Individual - Other (Comment) (Play with cat)  Frequency of Recreation/Participation: Other (Comment) (Daily)  Awareness of Community Resources:  Yes  Community Resources:  [place], Other (Comment) (Stores)  Current Use: Yes  If no, Barriers?:    Expressed Interest in [place]: No  [employer] of Residence:  [place]  Patient Main Form of Transportation: Other (Comment) (SCAT)  Patient Strengths:  Keeping mouth closed; Helping others; Minding my business  Patient Identified Areas of Improvement:  Stop drinking; Go to church more  Patient Goal for Hospitalization:  "get out of here to go take care of my cat"  Current SI (including self-harm):  No  Current HI:  No  Current AVH: No  Staff Intervention Plan: Group Attendance, Collaborate with Interdisciplinary Treatment Team  Consent to Intern Participation: N/A    Victorino Sparrow, LRT/CTRS  Victorino Sparrow A 05/18/2020, 1:54 PM

## 2020-05-18 NOTE — ED Notes (Signed)
Ordered breakfast--[name] 

## 2020-05-18 NOTE — Tx Team (Signed)
Initial Treatment Plan 05/18/2020 2:17 PM [name] ZOX:[id number]    PATIENT STRESSORS: Financial difficulties Health problems Substance abuse   PATIENT STRENGTHS: Capable of independent living Communication skills General fund of knowledge Motivation for treatment/growth Supportive family/friends   PATIENT IDENTIFIED PROBLEMS: "alcohol use"  "hallucinations"  "Anxiety"  "depression"               DISCHARGE CRITERIA:  Ability to meet basic life and health needs Adequate post-discharge living arrangements Improved stabilization in mood, thinking, and/or behavior Medical problems require only outpatient monitoring Motivation to continue treatment in a less acute level of care Need for constant or close observation no longer present Reduction of life-threatening or endangering symptoms to within safe limits Safe-care adequate arrangements made Verbal commitment to aftercare and medication compliance Withdrawal symptoms are absent or subacute and managed without 24-hour nursing intervention  PRELIMINARY DISCHARGE PLAN: Attend aftercare/continuing care group Attend PHP/IOP Attend 12-step recovery group Outpatient therapy Return to previous living arrangement  PATIENT/FAMILY INVOLVEMENT: This treatment plan has been presented to and reviewed with the patient, [name]..  The patient and family have been given the opportunity to ask questions and make suggestions.  Grayland O[place]ond [place], [place] 05/18/2020, 2:17 PM

## 2020-05-18 NOTE — Progress Notes (Signed)
Patient is 59 yrs old, voluntary, EMS brought patient to [facility] ED after patient was found in a parking lot.  Patient stated he was passed out somewhere and now in the hospital.  Patient has dry/dirty feet/skin.  R lung cancer surgery 2016, partial surgery of R lunch.  Hx of COPD, CHF, diabetic for 10 yrs, no insulin or oral meds.   Rated depression 10, anxiety 10+, hopeless 6.  Denied SI now, does not remember yesterday.  Denied HI.  Voices to kill himself, worthless, "man says come to me.  Old black man 78, dad died of heart attack when I was 55 ys old.  Visual "crazy mess, demons."  Liquor, wine "much", since 59 yrs old, stole it, yard parties.  THC since 59 yrs old, joint twice a month.  Denied heroin/cocaine use.  Tobacco one pack daily since age of 59 yrs old.  Denied physical, verbal, sexual abuse.  Lives by himself on [address] near [facility] by himself.  High school graduate.  [place].   Fall risk information given and discussed with patient who stated he understood and had no questions, high fall risk. Patient oriented to 500 hall, given food/drink, etc.

## 2020-05-18 NOTE — H&P (Signed)
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 auditory hallucinations.  He will be admitted to the hospital.  He will be integrated in the milieu.  He will be encouraged to attend groups.  He will be placed on lorazepam 1 mg p.o. every 6 hours as needed a CIWA greater than 10.  We will also continue his Vraylar 3 mg p.o. nightly and his trazodone at 300 mg  p.o. nightly.  He is on HIV medications and these will be continued.  His COPD medications will be continued.  His congestive heart failure medications will be continued.  Review of his admission laboratories showed a creatinine of 0.74, an AST of 50 and an ALT of 44.  His CBC showed  essentially normal numbers except for his platelets at 122,000.  2 months ago it was 128,000.  PT was 12.1, INR was 0.9.  Acetaminophen was less than 10, salicylate less than 7.  His urinalysis was essentially normal.  Blood alcohol was 311.  Drug screen was negative.  His CT scan of the cervical spine as well as head was essentially normal.  Chest x-ray showed no acute cardiopulmonary disease.  There is status post right partial lung resection.  His EKG showed a sinus tachycardia with a QTc interval of 488.  Observation Level/Precautions:  Detox 15 minute checks  Laboratory:  Chemistry Profile  Psychotherapy:    Medications:    Consultations:    Discharge Concerns:    Estimated LOS:  Other:     Physician Treatment Plan for Primary Diagnosis: <principal problem not specified> Long Term Goal(s): Improvement in symptoms so as ready for discharge  Short Term Goals: Ability to identify changes in lifestyle to reduce recurrence of condition will improve, Ability to verbalize feelings will improve, Ability to disclose and discuss suicidal ideas, Ability to demonstrate self-control will improve, Ability to identify and develop effective coping behaviors will improve, Ability to maintain clinical measurements within normal limits will improve and Ability to identify triggers associated with substance abuse/mental health issues will improve  Physician Treatment Plan for Secondary Diagnosis: Active Problems:   Schizophrenia ([place])  Long Term Goal(s): Improvement in symptoms so as ready for discharge  Short Term Goals: Ability to identify changes in lifestyle to reduce recurrence of condition will improve, Ability to verbalize feelings will improve, Ability to disclose and discuss suicidal ideas, Ability to demonstrate self-control will improve, Ability to identify and develop effective coping behaviors will improve, Ability to maintain clinical measurements within normal limits will improve and Ability to  identify triggers associated with substance abuse/mental health issues will improve  I certify that inpatient services furnished can reasonably be expected to improve the patient's condition.    Sharma Covert, MD 8/23/20215:53 PM

## 2020-05-18 NOTE — BH Assessment (Signed)
[name], [name], said Pt is accepted to the service of Dr. Mallie Darting, room [phone], and can be transported after 0800. Notified Dr. Veryl Speak and RN of acceptance.   [name], [facility], [name] Triage Specialist [phone]

## 2020-05-18 NOTE — ED Notes (Signed)
Pt accepted by Dr. Mallie Darting at [facility] Room 506-1 will be available after 0800

## 2020-05-18 NOTE — ED Notes (Signed)
TTS at Bedside

## 2020-05-19 ENCOUNTER — Other Ambulatory Visit: Payer: Self-pay | Admitting: *Deleted

## 2020-05-19 MED ORDER — ENSURE ENLIVE PO LIQD: 237.0000 mL | ORAL | Status: DC

## 2020-05-19 MED ORDER — ADULT MULTIVITAMIN W/MINERALS CH: 1.0000 | ORAL_TABLET | Freq: Every day | ORAL | Status: DC

## 2020-05-19 MED ORDER — PRAVASTATIN SODIUM 40 MG PO TABS: 40.0000 mg | ORAL_TABLET | Freq: Every day | ORAL | 0 refills | Status: DC

## 2020-05-19 MED ORDER — CARVEDILOL 6.25 MG PO TABS
6.2500 mg | ORAL_TABLET | Freq: Two times a day (BID) | ORAL | 0 refills | Status: DC
Start: 2020-05-19 — End: 2021-02-18

## 2020-05-19 MED ORDER — CARIPRAZINE HCL 3 MG PO CAPS
3.0000 mg | ORAL_CAPSULE | Freq: Every day | ORAL | 0 refills | Status: DC
Start: 2020-05-20 — End: 2020-07-20

## 2020-05-19 MED ORDER — NICOTINE 21 MG/24HR TD PT24: 21.0000 mg | MEDICATED_PATCH | Freq: Every day | TRANSDERMAL | 0 refills | Status: DC

## 2020-05-19 MED ORDER — TRAZODONE HCL 100 MG PO TABS: 300.0000 mg | ORAL_TABLET | Freq: Every day | ORAL | 0 refills | Status: DC

## 2020-05-19 MED ORDER — EMTRICITABINE-TENOFOVIR AF 200-25 MG PO TABS
1.0000 | ORAL_TABLET | Freq: Every day | ORAL | 0 refills | Status: DC
Start: 2020-05-20 — End: 2020-07-26

## 2020-05-19 MED ORDER — GABAPENTIN 100 MG PO CAPS: 200.0000 mg | ORAL_CAPSULE | Freq: Two times a day (BID) | ORAL | 0 refills | Status: DC

## 2020-05-19 MED ORDER — DICYCLOMINE HCL 20 MG PO TABS: 20.0000 mg | ORAL_TABLET | Freq: Three times a day (TID) | ORAL | 0 refills | Status: DC

## 2020-05-19 NOTE — Progress Notes (Signed)
NUTRITION ASSESSMENT RD working remotely.  Pt identified as at risk on the Malnutrition Screen Tool  INTERVENTION: - will order Ensure Enlive once/day, each supplement provides 350 kcal and 20 grams of protein. - will order 1 tablet multivitamin with minerals/day.   NUTRITION DIAGNOSIS: Unintentional weight loss related to sub-optimal intake as evidenced by pt report.   Goal: Pt to meet >/= 90% of their estimated nutrition needs.  Monitor:  PO intake  Assessment:  Patient admitted for schizophrenia. He was found lying down in a parking lot with a laceration to his L eye. He was intoxicated d/t alcohol and reported feelings of SI.   Weight yesterday was 185 lb. Weight on 7/10 was 188 lb and weight on 6/9 was 184 lb.   59 y.o. male  Height: Ht Readings from Last 1 Encounters:  05/18/20 6\' 3"  (1.905 m)    Weight: Wt Readings from Last 1 Encounters:  05/18/20 83.9 kg    Weight Hx: Wt Readings from Last 10 Encounters:  05/18/20 83.9 kg  05/03/20 86.2 kg  04/04/20 85.7 kg  03/04/20 83.5 kg  03/03/20 83.1 kg  01/17/20 86.2 kg  01/15/20 84.9 kg  12/04/19 85.3 kg  10/14/19 88.5 kg  10/10/19 88.7 kg    BMI:  Body mass index is 23.12 kg/m. Pt meets criteria for normal weight based on current BMI.  Estimated Nutritional Needs: Kcal: 25-30 kcal/kg Protein: > 1 gram protein/kg Fluid: 1 ml/kcal  Diet Order:  Diet Order            Diet regular Room service appropriate? Yes; Fluid consistency: Thin  Diet effective now                Pt is also offered choice of unit snacks mid-morning and mid-afternoon.  Pt is eating as desired.   Lab results and medications reviewed.      Jarome Matin, MS, RD, LDN, CNSC Inpatient Clinical Dietitian RD pager # available in [place]  After hours/weekend pager # available in [facility]

## 2020-05-19 NOTE — Progress Notes (Signed)
Pt discharged to lobby. Pt was stable and appreciative at that time. All papers and prescriptions were given and valuables returned. Verbal understanding expressed. Denies SI/HI and A/VH. Pt given opportunity to express concerns and ask questions.  
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
## 2020-05-19 NOTE — BHH Counselor (Signed)
Adult Comprehensive Assessment  Patient ID: [name], male   DOB: 1961-03-12, 59 y.o.   MRN: [id number]  Information Source: Medical records   CSW was unable to complete full PSA due to this patient discharging within 24 hours.    Summary/Recommendations:   Summary and Recommendations (to be completed by the evaluator): [name] is an 59 y.o. single male who presents unaccompanied to [facility] ED via EMS after being found lying in a parking lot with laceration to left eye. Pt presented with blood alcohol level of 311. He reports a history of depression and repeatedly says "I feel like committing suicide." He reports plan to ingest "a cocktail" of medications. Full assessment was unable to be completed due to this patient discharging within 24 hours. While here, Ranell Skibinski can benefit from crisis stabilization, medication management, therapeutic milieu, and referrals for services.  Inge Waldroup A Jonelle Bann. 05/19/2020

## 2020-05-19 NOTE — Progress Notes (Signed)
  [facility] Adult Case Management Discharge Plan :  Will you be returning to the same living situation after discharge:  Yes,  to home. At discharge, do you have transportation home?: No. Safe Transport will be arranged. Do you have the ability to pay for your medications: Yes,  has insurance.  Release of information consent forms completed and in the chart;  Patient's signature needed at discharge.  Patient to Follow up at:   Next level of care provider has access to [place] and Suicide Prevention discussed: Yes,  with patient.  Have you used any form of tobacco in the last 30 days? (Cigarettes, Smokeless Tobacco, Cigars, and/or Pipes): Yes  Has patient been referred to the Quitline?: Patient refused referral  Patient has been referred for addiction treatment: Pt. refused referral  Vassie Moselle, LCSW 05/19/2020, 9:53 AM

## 2020-05-19 NOTE — Progress Notes (Signed)
DAR NOTE: Patient presents with anxious affect and depressed mood.  Denies pain, auditory and visual hallucinations. Maintained on routine safety checks.  Medications given as prescribed.  Support and encouragement offered as needed. Will continue to monitor.

## 2020-05-19 NOTE — Patient Outreach (Signed)
[facility]) Care Management  05/19/2020  LEX LINHARES 20-Feb-1961 [id number]   RN Health Coach Discipline closure. Patient has been admitted to hospital. Patient will be followed by Complex Care Coordinator once discharged.  Plan: RN Health Coach Discipline Closure Discipline Closure letter sent to PCP  [place] Management [phone]

## 2020-05-19 NOTE — BHH Suicide Risk Assessment (Signed)
[facility] Discharge Suicide Risk Assessment   Principal Problem: <principal problem not specified> Discharge Diagnoses: Active Problems:   Schizophrenia ([place])   Total Time spent with patient: 20 minutes  Musculoskeletal: Strength & Muscle Tone: within normal limits Gait & Station: normal Patient leans: N/A  Psychiatric Specialty Exam: Review of Systems  All other systems reviewed and are negative.   Blood pressure 115/86, pulse 80, temperature 98.2 F (36.8 C), temperature source Oral, resp. rate 18, height 6\' 3"  (1.905 m), weight 83.9 kg, SpO2 100 %.Body mass index is 23.12 kg/m.  General Appearance: Casual  Eye Contact::  Good  Speech:  Normal Rate409  Volume:  Normal  Mood:  Euthymic  Affect:  Congruent  Thought Process:  Coherent and Descriptions of Associations: Intact  Orientation:  Full (Time, Place, and Person)  Thought Content:  Logical  Suicidal Thoughts:  No  Homicidal Thoughts:  No  Memory:  Immediate;   Fair Recent;   Fair Remote;   Fair  Judgement:  Intact  Insight:  Lacking  Psychomotor Activity:  Normal  Concentration:  Fair  Recall:  [employer] of Knowledge:Fair  Language: Good  Akathisia:  Negative  Handed:  Right  AIMS (if indicated):     Assets:  Desire for Improvement Housing Resilience  Sleep:  Number of Hours: 6.25  Cognition: WNL  ADL's:  Intact   Mental Status Per Nursing Assessment::   On Admission:  NA  Demographic Factors:  Male, Low socioeconomic status, Living alone and Unemployed  Loss Factors: Decline in physical health  Historical Factors: Impulsivity  Risk Reduction Factors:   NA  Continued Clinical Symptoms:  Alcohol/Substance Abuse/Dependencies Schizophrenia:   Paranoid or undifferentiated type  Cognitive Features That Contribute To Risk:  Thought constriction (tunnel vision)    Suicide Risk:  Minimal: No identifiable suicidal ideation.  Patients presenting with no risk factors but with morbid ruminations; may  be classified as minimal risk based on the severity of the depressive symptoms    Plan Of Care/Follow-up recommendations:  Activity:  ad lib  Sharma Covert, MD 05/19/2020, 7:47 AM

## 2020-05-19 NOTE — BHH Suicide Risk Assessment (Signed)
[place] INPATIENT:  Family/Significant Other Suicide Prevention Education   Suicide Prevention Education:  Patient Refusal for Family/Significant Other Suicide Prevention Education: The patient [name] has refused to provide written consent for family/significant other to be provided Family/Significant Other Suicide Prevention Education during admission and/or prior to discharge.  Physician notified.   SPE completed with patient, as patient refused to consent to family contact. SPI pamphlet provided to pt and pt was encouraged to share information with support network, ask questions, and talk about any concerns relating to SPE. Patient denies access to guns/firearms and verbalized understanding of information provided. Mobile Crisis information also provided to patient.   Darletta Moll MSW, LCSW Clincal Social Worker  [facility]

## 2020-05-21 ENCOUNTER — Ambulatory Visit: Payer: Self-pay | Admitting: *Deleted

## 2020-05-22 IMAGING — DX PORTABLE CHEST - 1 VIEW
1 series · 1 of 1 positions shown · non-contrast
Comparison: Chest x-ray 11/03/2017.

CLINICAL DATA: 58-year-old male with history of chest pain and
shortness of breath.

EXAM:
PORTABLE CHEST 1 VIEW

[chest ap]
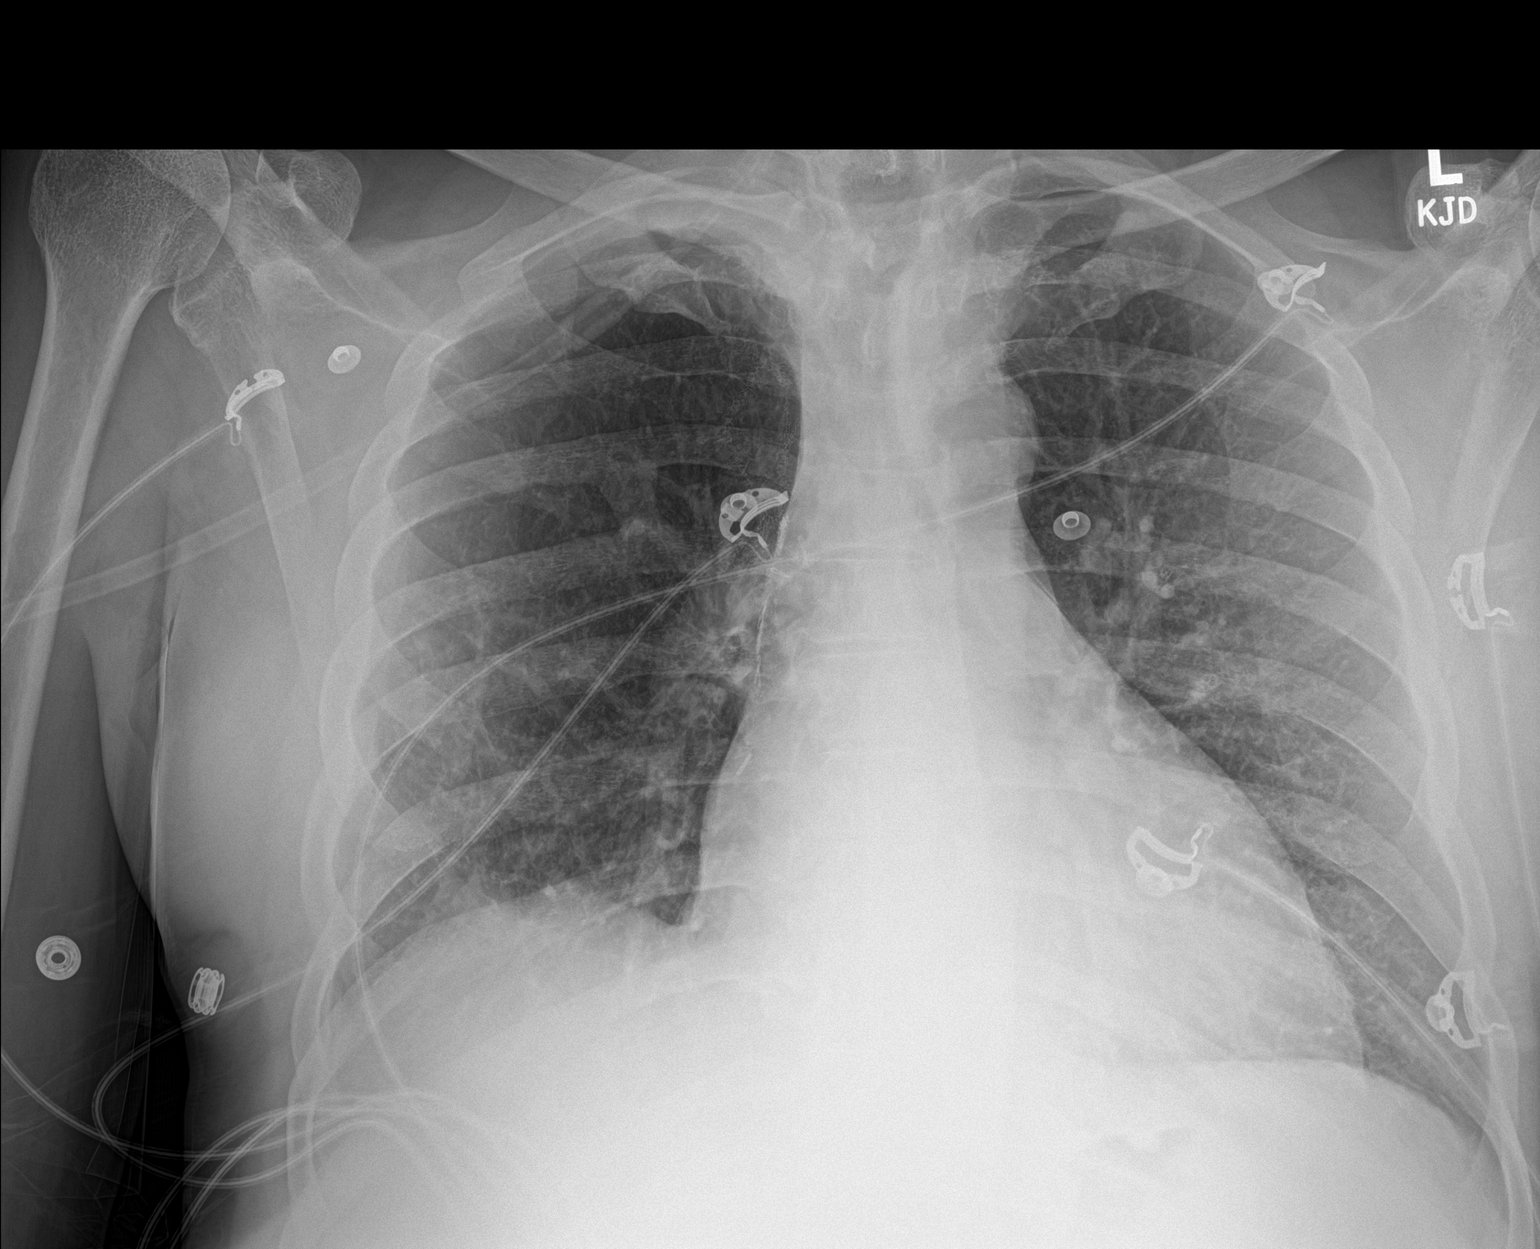

[1 of 1 positions shown; findings below may reference images not displayed]

FINDINGS: Areas of chronic scarring in the periphery of the right lung related
to prior right upper lobectomy. Lung volumes are normal. No
consolidative airspace disease. No pleural effusions. No
pneumothorax. No pulmonary nodule or mass noted. Pulmonary
vasculature and the cardiomediastinal silhouette are within normal
limits.
IMPRESSION: 1.  No radiographic evidence of acute cardiopulmonary disease.

## 2020-05-22 IMAGING — CT CT ANGIOGRAPHY CHEST
1 of 6 series · 4 of 16 positions shown · IV contrast (omnipaque)
Comparison: CT chest 12/03/2018

CLINICAL DATA: Patient with chest tightness and shortness of
breath. Elevated D-dimer. History of lung cancer.

EXAM:
CT ANGIOGRAPHY CHEST WITH CONTRAST
TECHNIQUE: Multidetector CT imaging of the chest was performed using the
standard protocol during bolus administration of intravenous
contrast. Multiplanar CT image reconstructions and MIPs were
obtained to evaluate the vascular anatomy.
CONTRAST:  100mL OMNIPAQUE IOHEXOL 350 MG/ML SOLN

[Series 7: pe thins · axial · 0.73mm/px · z∈[+11,+188]mm · 4 of 423 slices shown]
[im 85/423  lung]
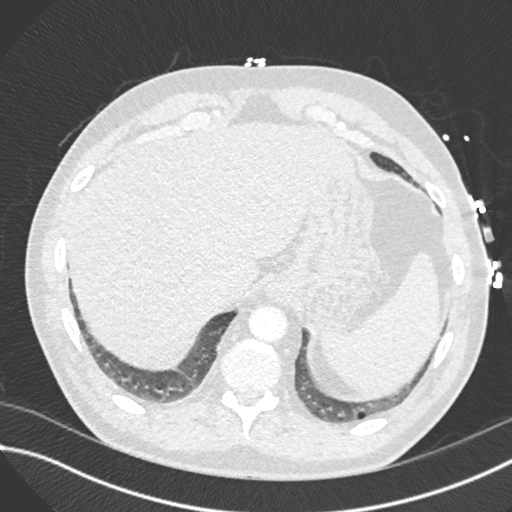
[im 169/423  soft-tissue]
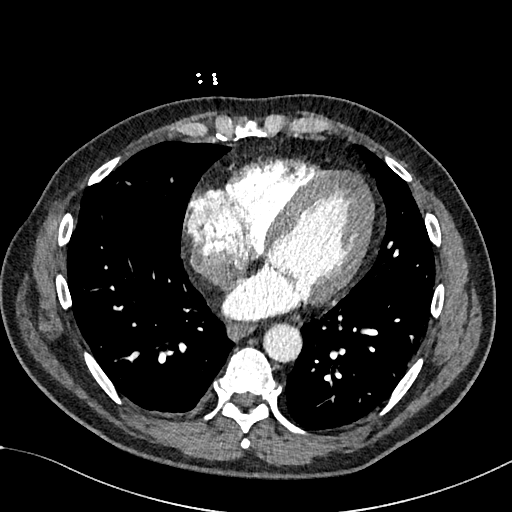
[im 254/423  lung]
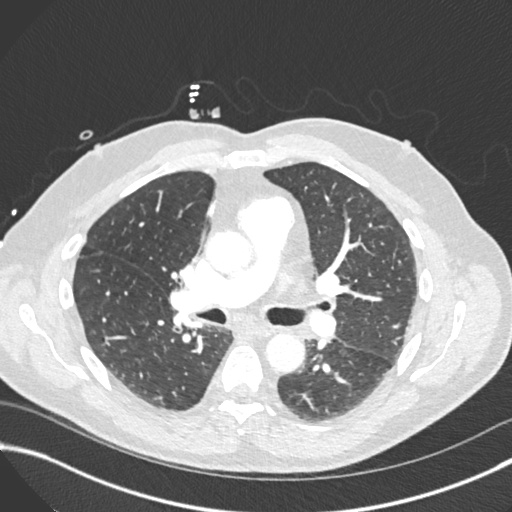
[im 338/423  soft-tissue]
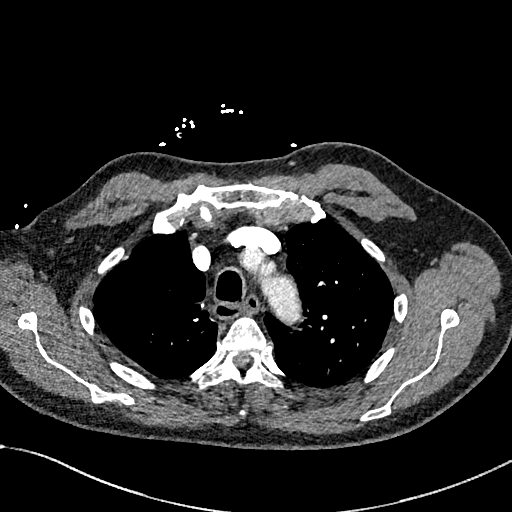

[4 of 16 positions shown; findings below may reference images not displayed]

FINDINGS: Cardiovascular: Heart is mildly enlarged. No pericardial effusion.
Aorta and main pulmonary artery normal in caliber. Adequate
opacification of the pulmonary arterial system. No intraluminal
filling defects identified within the pulmonary arterial system to
suggest acute pulmonary embolus.

Mediastinum/Nodes: No pathologically enlarged axillary, mediastinal
or hilar adenopathy. Normal appearance of the esophagus.

Lungs/Pleura: Postsurgical changes compatible with right upper
lobectomy. Dependent atelectasis within the bilateral lower lobes.
No large area pulmonary consolidation. No pleural effusion or
pneumothorax. Mild centrilobular emphysematous changes.

Upper Abdomen: Liver is normal in size and contour. Liver is
diffusely low in attenuation. No acute process.

Musculoskeletal: No aggressive or acute appearing osseous lesions.
Thoracic spine degenerative changes.

Review of the MIP images confirms the above findings.
IMPRESSION: No evidence for acute pulmonary embolus.

No evidence for recurrent disease or metastatic disease status post
right upper lobectomy.

Aortic Atherosclerosis (QJZ79-7VU.U) and Emphysema (QJZ79-4R3.7).

## 2020-06-06 ENCOUNTER — Telehealth (HOSPITAL_COMMUNITY): Payer: Medicare Other | Admitting: Adult Health

## 2020-06-11 ENCOUNTER — Telehealth (HOSPITAL_COMMUNITY): Payer: Self-pay | Admitting: Psychiatry

## 2020-06-11 DIAGNOSIS — J45909 Unspecified asthma, uncomplicated: Secondary | ICD-10-CM | POA: Diagnosis not present

## 2020-06-11 DIAGNOSIS — J449 Chronic obstructive pulmonary disease, unspecified: Secondary | ICD-10-CM | POA: Diagnosis not present

## 2020-06-12 ENCOUNTER — Encounter: Payer: Self-pay | Admitting: Internal Medicine

## 2020-06-15 DIAGNOSIS — J449 Chronic obstructive pulmonary disease, unspecified: Secondary | ICD-10-CM | POA: Diagnosis not present

## 2020-06-15 DIAGNOSIS — J45909 Unspecified asthma, uncomplicated: Secondary | ICD-10-CM | POA: Diagnosis not present

## 2020-06-18 ENCOUNTER — Other Ambulatory Visit: Payer: Self-pay | Admitting: Internal Medicine

## 2020-06-18 DIAGNOSIS — Z7252 High risk homosexual behavior: Secondary | ICD-10-CM

## 2020-06-23 ENCOUNTER — Other Ambulatory Visit: Payer: Self-pay | Admitting: Internal Medicine

## 2020-06-23 DIAGNOSIS — J301 Allergic rhinitis due to pollen: Secondary | ICD-10-CM

## 2020-06-29 ENCOUNTER — Ambulatory Visit: Payer: Medicare HMO | Admitting: Internal Medicine

## 2020-06-29 ENCOUNTER — Encounter: Payer: Self-pay | Admitting: Internal Medicine

## 2020-07-08 ENCOUNTER — Ambulatory Visit (HOSPITAL_COMMUNITY): Payer: Medicaid Other | Admitting: Licensed Clinical Social Worker

## 2020-07-08 ENCOUNTER — Other Ambulatory Visit: Payer: Self-pay

## 2020-07-08 ENCOUNTER — Telehealth (HOSPITAL_COMMUNITY): Payer: Self-pay | Admitting: Licensed Clinical Social Worker

## 2020-07-08 NOTE — Telephone Encounter (Signed)
LCSW sent text link for video session scheduled for 11a. When pt failed to sign on LCSW called pt. Call went to vm. LCSW left detailed vm re purpose of call. Left office contact phone number for pt to call back re rescheduling options as desired.

## 2020-07-09 DIAGNOSIS — J449 Chronic obstructive pulmonary disease, unspecified: Secondary | ICD-10-CM | POA: Diagnosis not present

## 2020-07-09 DIAGNOSIS — J45909 Unspecified asthma, uncomplicated: Secondary | ICD-10-CM | POA: Diagnosis not present

## 2020-07-11 DIAGNOSIS — J449 Chronic obstructive pulmonary disease, unspecified: Secondary | ICD-10-CM | POA: Diagnosis not present

## 2020-07-11 DIAGNOSIS — J45909 Unspecified asthma, uncomplicated: Secondary | ICD-10-CM | POA: Diagnosis not present

## 2020-07-13 ENCOUNTER — Encounter (INDEPENDENT_AMBULATORY_CARE_PROVIDER_SITE_OTHER): Payer: Self-pay

## 2020-07-13 ENCOUNTER — Telehealth (HOSPITAL_COMMUNITY): Payer: Medicare HMO | Admitting: Psychiatry

## 2020-07-14 ENCOUNTER — Telehealth (INDEPENDENT_AMBULATORY_CARE_PROVIDER_SITE_OTHER): Payer: Medicare HMO | Admitting: Physician Assistant

## 2020-07-14 ENCOUNTER — Other Ambulatory Visit: Payer: Self-pay

## 2020-07-14 DIAGNOSIS — F418 Other specified anxiety disorders: Secondary | ICD-10-CM | POA: Diagnosis not present

## 2020-07-14 MED ORDER — SERTRALINE HCL 25 MG PO TABS: 25.0000 mg | ORAL_TABLET | Freq: Every day | ORAL | 1 refills | Status: DC

## 2020-07-14 MED ORDER — BUSPIRONE HCL 10 MG PO TABS: 10.0000 mg | ORAL_TABLET | Freq: Two times a day (BID) | ORAL | 1 refills | Status: DC
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
## 2020-07-15 ENCOUNTER — Other Ambulatory Visit: Payer: Self-pay | Admitting: Internal Medicine

## 2020-07-15 ENCOUNTER — Telehealth: Payer: Self-pay | Admitting: Internal Medicine

## 2020-07-15 DIAGNOSIS — J449 Chronic obstructive pulmonary disease, unspecified: Secondary | ICD-10-CM

## 2020-07-15 NOTE — Telephone Encounter (Signed)
Key: [id number]

## 2020-07-15 NOTE — Telephone Encounter (Signed)
umeclidinium-vilanterol (ANORO ELLIPTA) 62.5-25 MCG/INH AEPB Pharmacy requesting prior authorization for this medication  [place], [place] Phone:  [phone]  Fax:  [phone]

## 2020-07-15 NOTE — Telephone Encounter (Addendum)
PA was denied per insurance.   Appeal submitted.

## 2020-07-16 ENCOUNTER — Encounter (HOSPITAL_COMMUNITY): Payer: Self-pay | Admitting: Physician Assistant

## 2020-07-16 ENCOUNTER — Other Ambulatory Visit: Payer: Self-pay | Admitting: Internal Medicine

## 2020-07-16 ENCOUNTER — Encounter: Payer: Self-pay | Admitting: Internal Medicine

## 2020-07-16 DIAGNOSIS — J449 Chronic obstructive pulmonary disease, unspecified: Secondary | ICD-10-CM

## 2020-07-17 ENCOUNTER — Other Ambulatory Visit: Payer: Self-pay | Admitting: Internal Medicine

## 2020-07-17 DIAGNOSIS — E118 Type 2 diabetes mellitus with unspecified complications: Secondary | ICD-10-CM

## 2020-07-20 ENCOUNTER — Encounter: Payer: Self-pay | Admitting: Internal Medicine

## 2020-07-20 ENCOUNTER — Ambulatory Visit (INDEPENDENT_AMBULATORY_CARE_PROVIDER_SITE_OTHER): Payer: Medicare HMO | Admitting: Internal Medicine

## 2020-07-20 ENCOUNTER — Other Ambulatory Visit: Payer: Self-pay

## 2020-07-20 VITALS — BP 116/68 | HR 62 | Temp 98.1°F | Ht 75.0 in | Wt 189.0 lb

## 2020-07-20 DIAGNOSIS — E118 Type 2 diabetes mellitus with unspecified complications: Secondary | ICD-10-CM | POA: Diagnosis not present

## 2020-07-20 DIAGNOSIS — R351 Nocturia: Secondary | ICD-10-CM | POA: Diagnosis not present

## 2020-07-20 DIAGNOSIS — Z0000 Encounter for general adult medical examination without abnormal findings: Secondary | ICD-10-CM | POA: Diagnosis not present

## 2020-07-20 DIAGNOSIS — A530 Latent syphilis, unspecified as early or late: Secondary | ICD-10-CM

## 2020-07-20 DIAGNOSIS — E519 Thiamine deficiency, unspecified: Secondary | ICD-10-CM

## 2020-07-20 DIAGNOSIS — D696 Thrombocytopenia, unspecified: Secondary | ICD-10-CM | POA: Diagnosis not present

## 2020-07-20 DIAGNOSIS — N401 Enlarged prostate with lower urinary tract symptoms: Secondary | ICD-10-CM | POA: Diagnosis not present

## 2020-07-20 DIAGNOSIS — E785 Hyperlipidemia, unspecified: Secondary | ICD-10-CM

## 2020-07-20 DIAGNOSIS — F200 Paranoid schizophrenia: Secondary | ICD-10-CM

## 2020-07-20 DIAGNOSIS — Z7252 High risk homosexual behavior: Secondary | ICD-10-CM | POA: Diagnosis not present

## 2020-07-20 DIAGNOSIS — Z23 Encounter for immunization: Secondary | ICD-10-CM

## 2020-07-20 LAB — POCT GLYCOSYLATED HEMOGLOBIN (HGB A1C): Hemoglobin A1C: 5.8 % — AB (ref 4.0–5.6)

## 2020-07-20 LAB — URINALYSIS, ROUTINE W REFLEX MICROSCOPIC
Bilirubin Urine: NEGATIVE
Hgb urine dipstick: NEGATIVE
Ketones, ur: NEGATIVE
Leukocytes,Ua: NEGATIVE
Nitrite: NEGATIVE
RBC / HPF: NONE SEEN (ref 0–?)
Specific Gravity, Urine: 1.025 (ref 1.000–1.030)
Total Protein, Urine: NEGATIVE
Urine Glucose: NEGATIVE
Urobilinogen, UA: 1 (ref 0.0–1.0)
WBC, UA: NONE SEEN (ref 0–?)
pH: 6 (ref 5.0–8.0)

## 2020-07-20 LAB — CBC WITH DIFFERENTIAL/PLATELET
Basophils Absolute: 0 10*3/uL (ref 0.0–0.1)
Basophils Relative: 0.5 % (ref 0.0–3.0)
Eosinophils Absolute: 0 10*3/uL (ref 0.0–0.7)
Eosinophils Relative: 0.7 % (ref 0.0–5.0)
HCT: 48.5 % (ref 39.0–52.0)
Hemoglobin: 16.2 g/dL (ref 13.0–17.0)
Lymphocytes Relative: 36.9 % (ref 12.0–46.0)
Lymphs Abs: 2.3 10*3/uL (ref 0.7–4.0)
MCHC: 33.4 g/dL (ref 30.0–36.0)
MCV: 96.5 fl (ref 78.0–100.0)
Monocytes Absolute: 0.5 10*3/uL (ref 0.1–1.0)
Monocytes Relative: 8 % (ref 3.0–12.0)
Neutro Abs: 3.3 10*3/uL (ref 1.4–7.7)
Neutrophils Relative %: 53.9 % (ref 43.0–77.0)
Platelets: 82 10*3/uL — ABNORMAL LOW (ref 150.0–400.0)
RBC: 5.03 Mil/uL (ref 4.22–5.81)
RDW: 14.4 % (ref 11.5–15.5)
WBC: 6.1 10*3/uL (ref 4.0–10.5)

## 2020-07-20 LAB — LIPID PANEL
Cholesterol: 123 mg/dL (ref 0–200)
HDL: 35.2 mg/dL — ABNORMAL LOW (ref >39.00)
LDL Cholesterol: 71 mg/dL (ref 0–99)
NonHDL: 87.86
Total CHOL/HDL Ratio: 3
Triglycerides: 86 mg/dL (ref 0.0–149.0)
VLDL: 17.2 mg/dL (ref 0.0–40.0)

## 2020-07-20 LAB — BASIC METABOLIC PANEL
BUN: 7 mg/dL (ref 6–23)
CO2: 27 mEq/L (ref 19–32)
Calcium: 9 mg/dL (ref 8.4–10.5)
Chloride: 106 mEq/L (ref 96–112)
Creatinine, Ser: 0.78 mg/dL (ref 0.40–1.50)
GFR: 96.95 mL/min (ref >60.00)
Glucose, Bld: 97 mg/dL (ref 70–99)
Potassium: 3.9 mEq/L (ref 3.5–5.1)
Sodium: 140 mEq/L (ref 135–145)

## 2020-07-20 LAB — HEPATIC FUNCTION PANEL
ALT: 26 U/L (ref 0–53)
AST: 26 U/L (ref 0–37)
Albumin: 4.1 g/dL (ref 3.5–5.2)
Alkaline Phosphatase: 58 U/L (ref 39–117)
Bilirubin, Direct: 0.2 mg/dL (ref 0.0–0.3)
Total Bilirubin: 0.7 mg/dL (ref 0.2–1.2)
Total Protein: 6.5 g/dL (ref 6.0–8.3)

## 2020-07-20 LAB — PSA: PSA: 0.52 ng/mL (ref 0.10–4.00)

## 2020-07-20 MED ORDER — CARIPRAZINE HCL 3 MG PO CAPS: 3.0000 mg | ORAL_CAPSULE | Freq: Every day | ORAL | 1 refills | Status: DC

## 2020-07-20 NOTE — Progress Notes (Signed)
Subjective:  Patient ID: [name], male    DOB: March 13, 1961  Age: 59 y.o. MRN: [id number]  CC: Annual Exam, COPD, Diabetes, and Hyperlipidemia  This visit occurred during the SARS-CoV-2 public health emergency.  Safety protocols were in place, including screening questions prior to the visit, additional usage of staff PPE, and extensive cleaning of exam room while observing appropriate contact time as indicated for disinfecting solutions.    HPI [name] presents for a CPX.  1.  He tells me he has abstained from alcohol intake for the last 2 months.  He denies abdominal pain, nausea, vomiting, loss of the appetite, or weight loss.  He wants to continue taking PrEP.  2.  He tells me he is down to 4 cigarettes a day.  He continues to have intermittent episodes of nonproductive cough with wheezing and shortness of breath.  He tells me he is using the inhaler.  He denies chest pain, hemoptysis, fever, or chills.  3.  He previously took Dietitian to treat schizophrenia.  He would like to restart it.  Outpatient Medications Prior to Visit  Medication Sig Dispense Refill  . albuterol (PROAIR HFA) 108 (90 Base) MCG/ACT inhaler Inhale 2 puffs into the lungs every 4 (four) hours as needed for wheezing. 3 Inhaler 3  . busPIRone (BUSPAR) 10 MG tablet Take 1 tablet (10 mg total) by mouth in the morning and at bedtime. 60 tablet 1  . carvedilol (COREG) 6.25 MG tablet Take 1 tablet (6.25 mg total) by mouth 2 (two) times daily with a meal. 60 tablet 0  . Multiple Vitamin (MULTIVITAMIN WITH MINERALS) TABS tablet Take 1 tablet by mouth daily.    . nicotine (NICODERM CQ - DOSED IN MG/24 HOURS) 21 mg/24hr patch Place 1 patch (21 mg total) onto the skin daily. 28 patch 0  . pravastatin (PRAVACHOL) 40 MG tablet Take 1 tablet (40 mg total) by mouth daily. 30 tablet 0  . sacubitril-valsartan (ENTRESTO) 24-26 MG Take 1 tablet by mouth 2 (two) times daily. 60 tablet 11  . sertraline (ZOLOFT) 25 MG tablet  Take 1 tablet (25 mg total) by mouth daily. 30 tablet 1  . traZODone (DESYREL) 100 MG tablet Take 3 tablets (300 mg total) by mouth at bedtime. 90 tablet 0  . umeclidinium-vilanterol (ANORO ELLIPTA) 62.5-25 MCG/INH AEPB Inhale 1 puff into the lungs daily. 120 each 1  . cariprazine (VRAYLAR) capsule Take 1 capsule (3 mg total) by mouth daily. 30 capsule 0  . dicyclomine (BENTYL) 20 MG tablet Take 1 tablet (20 mg total) by mouth 4 (four) times daily -  before meals and at bedtime. 120 tablet 0  . emtricitabine-tenofovir AF (DESCOVY) 200-25 MG tablet Take 1 tablet by mouth daily. 30 tablet 0  . fluticasone (FLONASE) 50 MCG/ACT nasal spray Place 2 sprays into both nostrils daily. 48 g 1  . gabapentin (NEURONTIN) 100 MG capsule Take 2 capsules (200 mg total) by mouth 2 (two) times daily. 120 capsule 0   No facility-administered medications prior to visit.    ROS Review of Systems  Constitutional: Negative.  Negative for appetite change, diaphoresis, fatigue and fever.  HENT: Negative.   Eyes: Negative.   Respiratory: Positive for cough, shortness of breath and wheezing. Negative for chest tightness and stridor.   Cardiovascular: Negative for chest pain, palpitations and leg swelling.  Gastrointestinal: Negative for constipation, diarrhea, nausea and vomiting.  Endocrine: Negative.  Negative for polydipsia, polyphagia and polyuria.  Genitourinary: Negative.  Negative  for difficulty urinating, discharge, dysuria, testicular pain and urgency.  Musculoskeletal: Negative for arthralgias, myalgias and neck pain.  Skin: Negative.  Negative for color change and pallor.  Neurological: Negative.  Negative for dizziness, weakness, light-headedness and headaches.  Hematological: Negative for adenopathy. Does not bruise/bleed easily.  Psychiatric/Behavioral: Negative for agitation, behavioral problems, confusion, decreased concentration, dysphoric mood, self-injury, sleep disturbance and suicidal ideas. The  patient is nervous/anxious. The patient is not hyperactive.     Objective:  BP 116/68   Pulse 62   Temp 98.1 F (36.7 C) (Oral)   Ht 6\' 3"  (1.905 m)   Wt 189 lb (85.7 kg)   SpO2 99%   BMI 23.62 kg/m   BP Readings from Last 3 Encounters:  07/20/20 116/68  05/18/20 134/65  04/04/20 (!) 145/87    Wt Readings from Last 3 Encounters:  07/20/20 189 lb (85.7 kg)  04/04/20 189 lb (85.7 kg)  03/04/20 184 lb (83.5 kg)    Physical Exam Vitals reviewed.  Constitutional:      General: He is not in acute distress.    Appearance: Normal appearance. He is not ill-appearing, toxic-appearing or diaphoretic.  HENT:     Nose: Nose normal.     Mouth/Throat:     Mouth: Mucous membranes are moist.  Eyes:     General: No scleral icterus.    Pupils: Pupils are equal, round, and reactive to light.  Cardiovascular:     Rate and Rhythm: Normal rate and regular rhythm.     Heart sounds: No murmur heard.   Pulmonary:     Effort: Pulmonary effort is normal.     Breath sounds: No stridor. No wheezing, rhonchi or rales.  Abdominal:     General: Abdomen is flat. Bowel sounds are normal. There is no distension.     Palpations: Abdomen is soft. There is no hepatomegaly, splenomegaly or mass.     Tenderness: There is no abdominal tenderness.  Genitourinary:    Comments: He did not undress for a GU or rectal exam.  He tells me he did not have enough time. Musculoskeletal:        General: Normal range of motion.     Cervical back: Neck supple.     Right lower leg: No edema.     Left lower leg: No edema.  Lymphadenopathy:     Cervical: No cervical adenopathy.  Skin:    General: Skin is warm and dry.     Coloration: Skin is not pale.     Findings: No rash.  Neurological:     General: No focal deficit present.     Mental Status: He is alert and oriented to person, place, and time. Mental status is at baseline.  Psychiatric:        Mood and Affect: Mood normal.        Behavior: Behavior  normal.     Lab Results  Component Value Date   WBC 6.1 07/20/2020   HGB 16.2 07/20/2020   HCT 48.5 07/20/2020   PLT 82.0 (L) 07/20/2020   GLUCOSE 97 07/20/2020   CHOL 123 07/20/2020   TRIG 86.0 07/20/2020   HDL 35.20 (L) 07/20/2020   LDLDIRECT 122.0 11/30/2016   LDLCALC 71 07/20/2020   ALT 26 07/20/2020   AST 26 07/20/2020   NA 140 07/20/2020   K 3.9 07/20/2020   CL 106 07/20/2020   CREATININE 0.78 07/20/2020   BUN 7 07/20/2020   CO2 27 07/20/2020   TSH 0.90 06/26/2019  PSA 0.52 07/20/2020   INR 0.9 05/17/2020   HGBA1C 5.8 (A) 07/20/2020   MICROALBUR 11.8 (H) 12/04/2019    No results found.  Assessment & Plan:   Franchot was seen today for annual exam, copd, diabetes and hyperlipidemia.  Diagnoses and all orders for this visit:  Hyperlipidemia with target LDL less than 130- He has achieved his LDL goal and is doing well on the statin. -     Lipid panel; Future -     Hepatic function panel; Future -     Hepatic function panel -     Lipid panel  Syphili, latent- His RPR titer remains low at 1:1.  There is no evidence of recurrence or reinfection. -     HIV Antibody (routine testing w rflx); Future -     RPR; Future -     RPR -     HIV Antibody (routine testing w rflx) -     GC/Chlamydia Probe Amp; Future -     Urinalysis, Routine w reflex microscopic; Future -     Urinalysis, Routine w reflex microscopic -     GC/Chlamydia Probe Amp  Thiamine deficiency- His thiamine level is undetectable.  I recommended that he restart a thiamine supplement. -     CBC with Differential/Platelet; Future -     Vitamin B1; Future -     Vitamin B1 -     CBC with Differential/Platelet -     thiamine (VITAMIN B-1) 50 MG tablet; Take 1 tablet (50 mg total) by mouth daily.  Thrombocytopenia ([place])- His platelet count remains low.  Work-up for secondary causes has been unremarkable.  This is likely ITP.  There is no history of bleeding or bruising. -     CBC with  Differential/Platelet; Future -     CBC with Differential/Platelet  Type II diabetes mellitus with manifestations ([place])- His blood sugars are adequately well controlled.  Medical therapy is not indicated. -     Basic metabolic panel; Future -     POCT glycosylated hemoglobin (Hb A1C) -     Ambulatory referral to Ophthalmology -     Basic metabolic panel -     Urinalysis, Routine w reflex microscopic; Future -     Urinalysis, Routine w reflex microscopic  High risk homosexual behavior- Screening for HIV and hepatitis B is negative.  He can continue to take PrEP. -     Hepatitis B surface antigen; Future -     Hepatitis B surface antigen -     GC/Chlamydia Probe Amp; Future -     Urinalysis, Routine w reflex microscopic; Future -     Urinalysis, Routine w reflex microscopic -     GC/Chlamydia Probe Amp -     emtricitabine-tenofovir AF (DESCOVY) 200-25 MG tablet; Take 1 tablet by mouth daily.  BPH associated with nocturia- His PSA is low which is a reassuring sign that he does not have prostate cancer.  He has no symptoms that need to be treated. -     PSA; Future -     PSA -     Urinalysis, Routine w reflex microscopic; Future -     Urinalysis, Routine w reflex microscopic  Routine general medical examination at a health care facility- Exam completed, labs reviewed, vaccines reviewed and updated, cancer screenings are up-to-date, patient education was given.  Paranoid schizophrenia ([place]) -     cariprazine (VRAYLAR) capsule; Take 1 capsule (3 mg total) by mouth daily.  Other  orders -     Flu Vaccine QUAD 6+ mos PF IM (Fluarix Quad PF) -     Rpr titer -     Fluorescent treponemal ab(fta)-IgG-bld   I have discontinued [facility]'s gabapentin, dicyclomine, and fluticasone. I am also having him start on thiamine. Additionally, I am having him maintain his albuterol, sacubitril-valsartan, traZODone, pravastatin, carvedilol, nicotine, multivitamin with minerals, sertraline,  busPIRone, Anoro Ellipta, cariprazine, and emtricitabine-tenofovir AF.  Meds ordered this encounter  Medications  . cariprazine (VRAYLAR) capsule    Sig: Take 1 capsule (3 mg total) by mouth daily.    Dispense:  90 capsule    Refill:  1  . thiamine (VITAMIN B-1) 50 MG tablet    Sig: Take 1 tablet (50 mg total) by mouth daily.    Dispense:  90 tablet    Refill:  1  . emtricitabine-tenofovir AF (DESCOVY) 200-25 MG tablet    Sig: Take 1 tablet by mouth daily.    Dispense:  90 tablet    Refill:  0   In addition to time spent on CPE, I spent 50 minutes in preparing to see the patient by review of recent labs, imaging and procedures, obtaining and reviewing separately obtained history, communicating with the patient and family or caregiver, ordering medications, tests or procedures, and documenting clinical information in the EHR including the differential Dx, treatment, and any further evaluation and other management of 1. Hyperlipidemia with target LDL less than 130 2. Syphili, latent 3. Thiamine deficiency 4. Thrombocytopenia ([place]) 5. Type II diabetes mellitus with manifestations ([place]) 6. High risk homosexual behavior 7. BPH associated with nocturia 8. Paranoid schizophrenia ([place])     Follow-up: Return in about 6 months (around 01/18/2021).  Scarlette Calico, MD

## 2020-07-20 NOTE — Patient Instructions (Signed)

## 2020-07-22 ENCOUNTER — Encounter: Payer: Self-pay | Admitting: Internal Medicine

## 2020-07-22 LAB — GC/CHLAMYDIA PROBE AMP
Chlamydia trachomatis, NAA: NEGATIVE
Neisseria Gonorrhoeae by PCR: NEGATIVE

## 2020-07-22 NOTE — Telephone Encounter (Signed)
I called [facility] to get him scheduled and apparently this is the eye doctor he's talking about.  They stated he had a full eye exam in April and is also scheduled for his next appt this coming April.  They also stated the exam doesn't show a diabetic and they do know he is diabetic.  Your thoughts on this?

## 2020-07-24 DIAGNOSIS — H1013 Acute atopic conjunctivitis, bilateral: Secondary | ICD-10-CM | POA: Diagnosis not present

## 2020-07-24 DIAGNOSIS — H04123 Dry eye syndrome of bilateral lacrimal glands: Secondary | ICD-10-CM | POA: Diagnosis not present

## 2020-07-25 LAB — HEPATITIS B SURFACE ANTIGEN: Hepatitis B Surface Ag: NONREACTIVE

## 2020-07-25 LAB — HIV ANTIBODY (ROUTINE TESTING W REFLEX): HIV 1&2 Ab, 4th Generation: NONREACTIVE

## 2020-07-25 LAB — RPR: RPR Ser Ql: REACTIVE — AB

## 2020-07-25 LAB — VITAMIN B1: Vitamin B1 (Thiamine): 7 nmol/L — ABNORMAL LOW (ref 8–30)

## 2020-07-25 LAB — FLUORESCENT TREPONEMAL AB(FTA)-IGG-BLD: Fluorescent Treponemal ABS: REACTIVE — AB

## 2020-07-25 LAB — RPR TITER: RPR Titer: 1:1 {titer} — ABNORMAL HIGH

## 2020-07-26 MED ORDER — EMTRICITABINE-TENOFOVIR AF 200-25 MG PO TABS: 1.0000 | ORAL_TABLET | Freq: Every day | ORAL | 0 refills | Status: DC

## 2020-07-26 MED ORDER — VITAMIN B-1 50 MG PO TABS: 50.0000 mg | ORAL_TABLET | Freq: Every day | ORAL | 1 refills | Status: DC

## 2020-07-28 ENCOUNTER — Emergency Department (HOSPITAL_COMMUNITY)
Admission: EM | Admit: 2020-07-28 | Discharge: 2020-07-28 | Discharge status: Against Medical Advice | Payer: Medicare HMO | Attending: Emergency Medicine | Admitting: Emergency Medicine

## 2020-07-28 ENCOUNTER — Encounter (HOSPITAL_COMMUNITY): Payer: Self-pay

## 2020-07-28 ENCOUNTER — Emergency Department (HOSPITAL_COMMUNITY): Payer: Medicare HMO

## 2020-07-28 ENCOUNTER — Other Ambulatory Visit: Payer: Self-pay

## 2020-07-28 DIAGNOSIS — I110 Hypertensive heart disease with heart failure: Secondary | ICD-10-CM | POA: Insufficient documentation

## 2020-07-28 DIAGNOSIS — E1169 Type 2 diabetes mellitus with other specified complication: Secondary | ICD-10-CM | POA: Diagnosis not present

## 2020-07-28 DIAGNOSIS — F17210 Nicotine dependence, cigarettes, uncomplicated: Secondary | ICD-10-CM | POA: Diagnosis not present

## 2020-07-28 DIAGNOSIS — E1151 Type 2 diabetes mellitus with diabetic peripheral angiopathy without gangrene: Secondary | ICD-10-CM | POA: Diagnosis not present

## 2020-07-28 DIAGNOSIS — Z743 Need for continuous supervision: Secondary | ICD-10-CM | POA: Diagnosis not present

## 2020-07-28 DIAGNOSIS — Z85118 Personal history of other malignant neoplasm of bronchus and lung: Secondary | ICD-10-CM | POA: Insufficient documentation

## 2020-07-28 DIAGNOSIS — Z79899 Other long term (current) drug therapy: Secondary | ICD-10-CM | POA: Insufficient documentation

## 2020-07-28 DIAGNOSIS — I509 Heart failure, unspecified: Secondary | ICD-10-CM | POA: Insufficient documentation

## 2020-07-28 DIAGNOSIS — J45909 Unspecified asthma, uncomplicated: Secondary | ICD-10-CM | POA: Diagnosis not present

## 2020-07-28 DIAGNOSIS — R0789 Other chest pain: Secondary | ICD-10-CM | POA: Insufficient documentation

## 2020-07-28 DIAGNOSIS — E1139 Type 2 diabetes mellitus with other diabetic ophthalmic complication: Secondary | ICD-10-CM | POA: Diagnosis not present

## 2020-07-28 DIAGNOSIS — R079 Chest pain, unspecified: Secondary | ICD-10-CM | POA: Diagnosis not present

## 2020-07-28 DIAGNOSIS — R0602 Shortness of breath: Secondary | ICD-10-CM | POA: Diagnosis not present

## 2020-07-28 DIAGNOSIS — Z86010 Personal history of colonic polyps: Secondary | ICD-10-CM | POA: Diagnosis not present

## 2020-07-28 DIAGNOSIS — Z5321 Procedure and treatment not carried out due to patient leaving prior to being seen by health care provider: Secondary | ICD-10-CM

## 2020-07-28 DIAGNOSIS — Z955 Presence of coronary angioplasty implant and graft: Secondary | ICD-10-CM | POA: Insufficient documentation

## 2020-07-28 DIAGNOSIS — E785 Hyperlipidemia, unspecified: Secondary | ICD-10-CM | POA: Insufficient documentation

## 2020-07-28 DIAGNOSIS — I2510 Atherosclerotic heart disease of native coronary artery without angina pectoris: Secondary | ICD-10-CM | POA: Insufficient documentation

## 2020-07-28 DIAGNOSIS — R0689 Other abnormalities of breathing: Secondary | ICD-10-CM | POA: Diagnosis not present

## 2020-07-28 DIAGNOSIS — J449 Chronic obstructive pulmonary disease, unspecified: Secondary | ICD-10-CM | POA: Insufficient documentation

## 2020-07-28 LAB — CBC WITH DIFFERENTIAL/PLATELET
Abs Immature Granulocytes: 0.03 10*3/uL (ref 0.00–0.07)
Basophils Absolute: 0.1 10*3/uL (ref 0.0–0.1)
Basophils Relative: 1 %
Eosinophils Absolute: 0.1 10*3/uL (ref 0.0–0.5)
Eosinophils Relative: 1 %
HCT: 50.7 % (ref 39.0–52.0)
Hemoglobin: 17.5 g/dL — ABNORMAL HIGH (ref 13.0–17.0)
Immature Granulocytes: 0 %
Lymphocytes Relative: 51 %
Lymphs Abs: 4.1 10*3/uL — ABNORMAL HIGH (ref 0.7–4.0)
MCH: 32.3 pg (ref 26.0–34.0)
MCHC: 34.5 g/dL (ref 30.0–36.0)
MCV: 93.5 fL (ref 80.0–100.0)
Monocytes Absolute: 0.5 10*3/uL (ref 0.1–1.0)
Monocytes Relative: 7 %
Neutro Abs: 3.1 10*3/uL (ref 1.7–7.7)
Neutrophils Relative %: 40 %
Platelets: 190 10*3/uL (ref 150–400)
RBC: 5.42 MIL/uL (ref 4.22–5.81)
RDW: 14.5 % (ref 11.5–15.5)
WBC: 7.9 10*3/uL (ref 4.0–10.5)
nRBC: 0 % (ref 0.0–0.2)

## 2020-07-28 LAB — COMPREHENSIVE METABOLIC PANEL
ALT: 26 U/L (ref 0–44)
AST: 28 U/L (ref 15–41)
Albumin: 3.8 g/dL (ref 3.5–5.0)
Alkaline Phosphatase: 67 U/L (ref 38–126)
Anion gap: 12 (ref 5–15)
BUN: <5 mg/dL — ABNORMAL LOW (ref 6–20)
CO2: 24 mmol/L (ref 22–32)
Calcium: 8.9 mg/dL (ref 8.9–10.3)
Chloride: 106 mmol/L (ref 98–111)
Creatinine, Ser: 0.72 mg/dL (ref 0.61–1.24)
GFR, Estimated: >60 mL/min (ref >60)
Glucose, Bld: 101 mg/dL — ABNORMAL HIGH (ref 70–99)
Potassium: 3.8 mmol/L (ref 3.5–5.1)
Sodium: 142 mmol/L (ref 135–145)
Total Bilirubin: 0.6 mg/dL (ref 0.3–1.2)
Total Protein: 7.2 g/dL (ref 6.5–8.1)

## 2020-07-28 LAB — ACETAMINOPHEN LEVEL: Acetaminophen (Tylenol), Serum: <10 ug/mL — ABNORMAL LOW (ref 10–30)

## 2020-07-28 LAB — BRAIN NATRIURETIC PEPTIDE: B Natriuretic Peptide: 48.8 pg/mL (ref 0.0–100.0)

## 2020-07-28 LAB — TROPONIN I (HIGH SENSITIVITY): Troponin I (High Sensitivity): 7 ng/L (ref <18)

## 2020-07-28 LAB — RAPID URINE DRUG SCREEN, HOSP PERFORMED
Amphetamines: NOT DETECTED
Barbiturates: NOT DETECTED
Benzodiazepines: NOT DETECTED
Cocaine: NOT DETECTED
Opiates: NOT DETECTED
Tetrahydrocannabinol: NOT DETECTED

## 2020-07-28 LAB — ETHANOL: Alcohol, Ethyl (B): 275 mg/dL — ABNORMAL HIGH (ref <10)

## 2020-07-28 LAB — SALICYLATE LEVEL: Salicylate Lvl: <7 mg/dL — ABNORMAL LOW (ref 7.0–30.0)

## 2020-07-28 MED ORDER — ALBUTEROL SULFATE HFA 108 (90 BASE) MCG/ACT IN AERS
2.0000 | INHALATION_SPRAY | Freq: Once | RESPIRATORY_TRACT | Status: AC
  Administered 2020-07-28: 2 via RESPIRATORY_TRACT
  Filled 2020-07-28: qty 6.7

## 2020-07-28 NOTE — ED Notes (Signed)
Pt ambulated to restroom with steady gait.

## 2020-07-28 NOTE — ED Triage Notes (Signed)
Pt BIB [facility] d/t Lt sided CP that began "earlier this evening." Pt gets SOB from the pain, O2 is at 97% on RA. EMS reports pt Refused [facility] while in route to ED. & Pt reports he does have a Hx of COPD CHF.

## 2020-07-28 NOTE — ED Notes (Signed)
Pt back from X-ray.  
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
## 2020-07-28 NOTE — ED Notes (Addendum)
Pt eloped, EDP aware.

## 2020-07-28 NOTE — ED Notes (Signed)
Patient transported to X-ray 

## 2020-08-01 ENCOUNTER — Encounter: Payer: Self-pay | Admitting: Internal Medicine

## 2020-08-04 ENCOUNTER — Encounter (HOSPITAL_COMMUNITY): Payer: Self-pay | Admitting: Emergency Medicine

## 2020-08-04 ENCOUNTER — Other Ambulatory Visit: Payer: Self-pay

## 2020-08-04 ENCOUNTER — Ambulatory Visit (HOSPITAL_COMMUNITY)
Admission: EM | Admit: 2020-08-04 | Discharge: 2020-08-04 | Disposition: A | Discharge status: Home / Self Care | Payer: Medicare HMO | Attending: Physician Assistant | Admitting: Physician Assistant

## 2020-08-04 ENCOUNTER — Encounter: Payer: Self-pay | Admitting: Internal Medicine

## 2020-08-04 ENCOUNTER — Other Ambulatory Visit: Payer: Self-pay | Admitting: Internal Medicine

## 2020-08-04 DIAGNOSIS — M545 Low back pain, unspecified: Secondary | ICD-10-CM | POA: Diagnosis not present

## 2020-08-04 DIAGNOSIS — M170 Bilateral primary osteoarthritis of knee: Secondary | ICD-10-CM

## 2020-08-04 DIAGNOSIS — G8929 Other chronic pain: Secondary | ICD-10-CM

## 2020-08-04 MED ORDER — MELOXICAM 15 MG PO TABS: 15.0000 mg | ORAL_TABLET | Freq: Every day | ORAL | 2 refills | Status: DC

## 2020-08-04 NOTE — Discharge Instructions (Signed)
Schedule to see Dr. Stann Mainland for evaluation
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
## 2020-08-04 NOTE — ED Triage Notes (Signed)
Pt presents with low back pain, right knee and ankle pain xs 1-2 weeks. Denies any fall or injury.

## 2020-08-11 DIAGNOSIS — J449 Chronic obstructive pulmonary disease, unspecified: Secondary | ICD-10-CM | POA: Diagnosis not present

## 2020-08-11 DIAGNOSIS — J45909 Unspecified asthma, uncomplicated: Secondary | ICD-10-CM | POA: Diagnosis not present

## 2020-08-25 ENCOUNTER — Other Ambulatory Visit: Payer: Self-pay

## 2020-08-25 ENCOUNTER — Telehealth (INDEPENDENT_AMBULATORY_CARE_PROVIDER_SITE_OTHER): Payer: Medicare HMO | Admitting: Physician Assistant

## 2020-08-25 ENCOUNTER — Encounter (HOSPITAL_COMMUNITY): Payer: Self-pay | Admitting: Physician Assistant

## 2020-08-25 DIAGNOSIS — F418 Other specified anxiety disorders: Secondary | ICD-10-CM | POA: Diagnosis not present

## 2020-08-25 MED ORDER — HYDROXYZINE HCL 25 MG PO TABS: 25.0000 mg | ORAL_TABLET | Freq: Three times a day (TID) | ORAL | 1 refills | Status: DC | PRN

## 2020-08-25 MED ORDER — BUSPIRONE HCL 15 MG PO TABS: 15.0000 mg | ORAL_TABLET | Freq: Two times a day (BID) | ORAL | 1 refills | Status: DC

## 2020-08-25 MED ORDER — SERTRALINE HCL 50 MG PO TABS: 50.0000 mg | ORAL_TABLET | Freq: Every day | ORAL | 1 refills | Status: DC
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
## 2020-08-26 DIAGNOSIS — J45909 Unspecified asthma, uncomplicated: Secondary | ICD-10-CM | POA: Diagnosis not present

## 2020-08-26 DIAGNOSIS — J449 Chronic obstructive pulmonary disease, unspecified: Secondary | ICD-10-CM | POA: Diagnosis not present

## 2020-09-03 IMAGING — CT CT ABD-PELV W/ CM
2 of 5 series · 16 of 46 positions shown, 18 images · IV contrast (APPLIED)
Comparison: CT, 01/17/2019

CLINICAL DATA: Abdominal pain and diarrhea for a few days.
Abdominal pain.

EXAM:
CT ABDOMEN AND PELVIS WITH CONTRAST
TECHNIQUE: Multidetector CT imaging of the abdomen and pelvis was performed
using the standard protocol following bolus administration of
intravenous contrast.
CONTRAST:  100mL OMNIPAQUE IOHEXOL 300 MG/ML  SOLN

[Series 3: abdomen 5.0 · axial · 0.78mm/px · z∈[+818,+1233]mm · 13 of 95 slices shown, 15 images]
[im 6/95  soft-tissue]
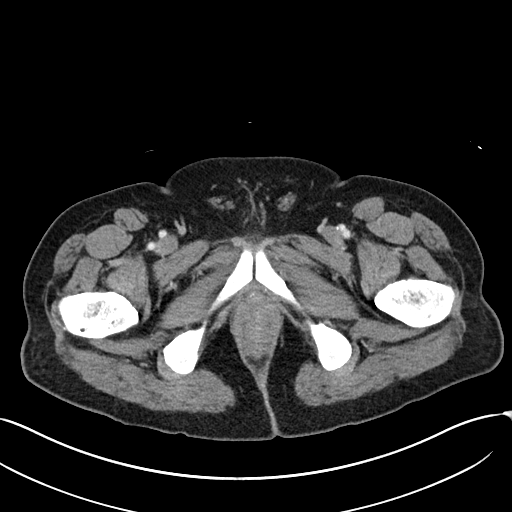
[im 6/95  bone]
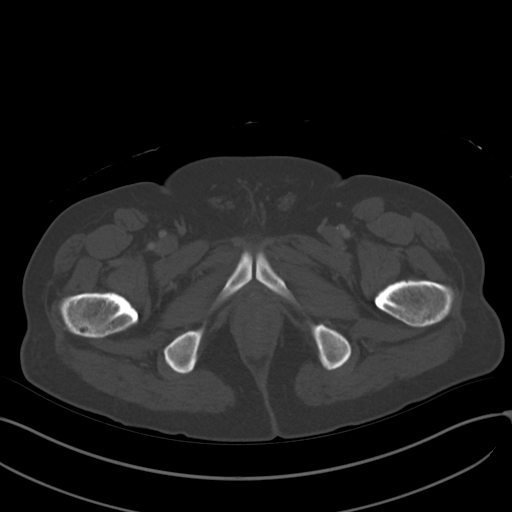
[im 12/95  soft-tissue]
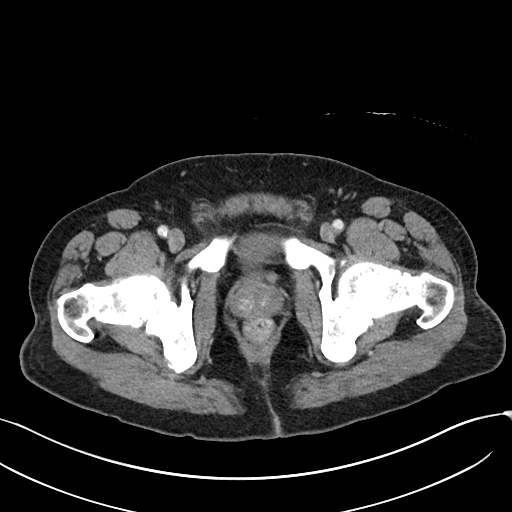
[im 18/95  soft-tissue]
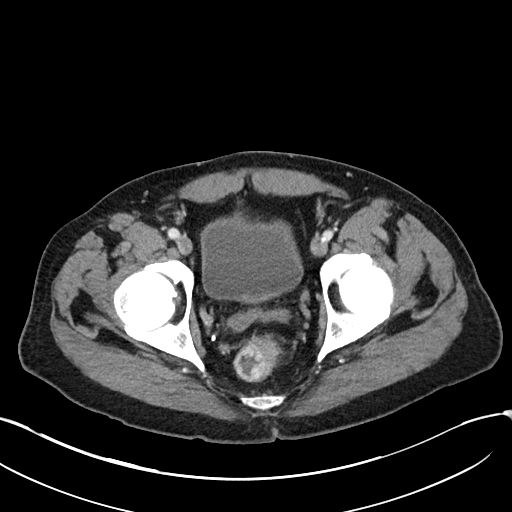
[im 30/95  soft-tissue]
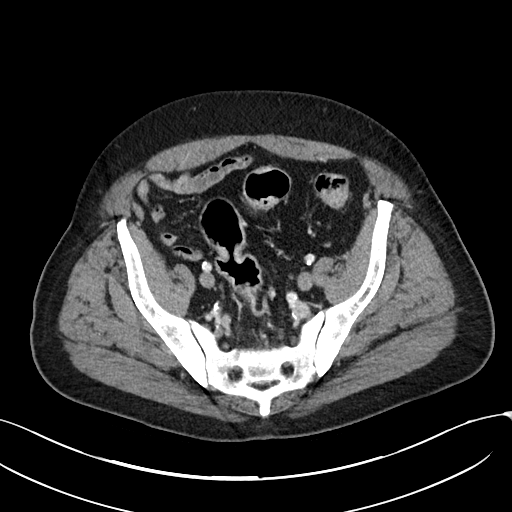
[im 36/95  soft-tissue]
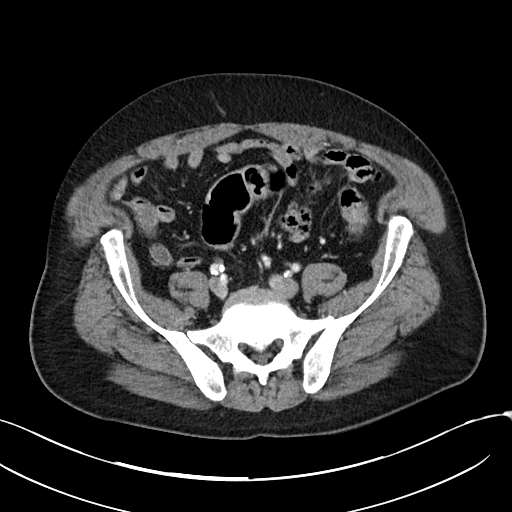
[im 42/95  soft-tissue]
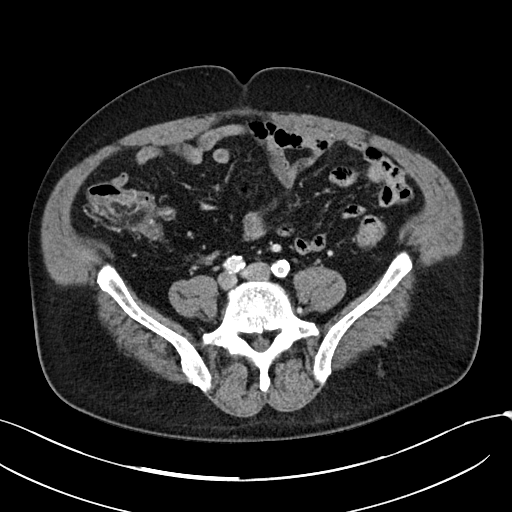
[im 48/95  soft-tissue]
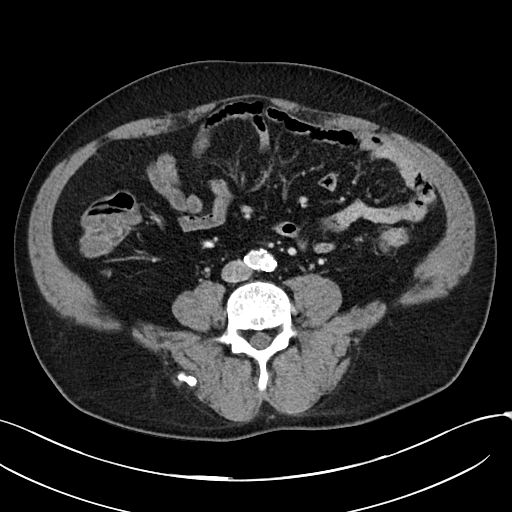
[im 53/95  soft-tissue]
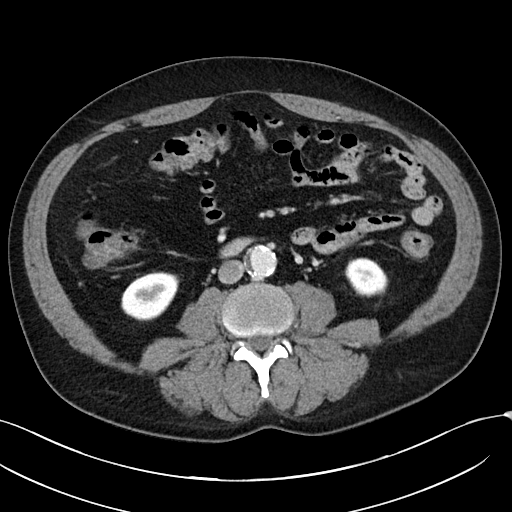
[im 59/95  soft-tissue]
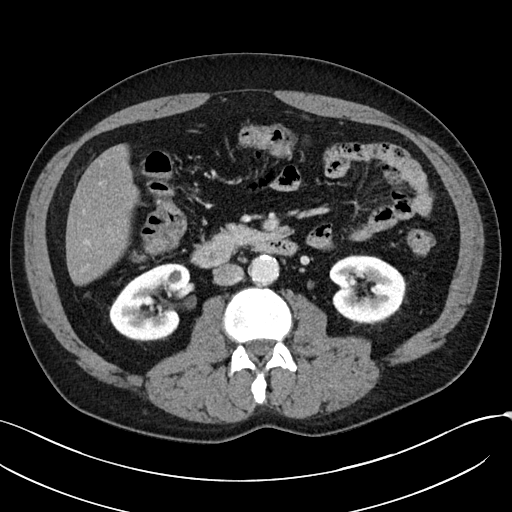
[im 59/95  bone]
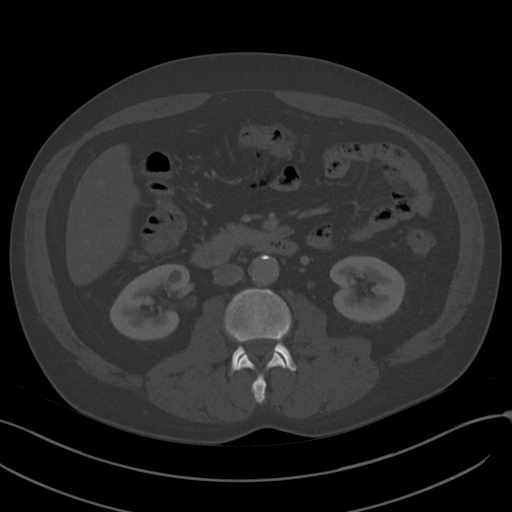
[im 65/95  soft-tissue]
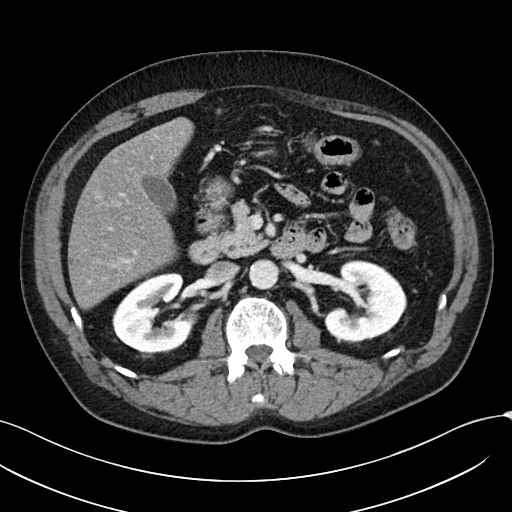
[im 77/95  soft-tissue]
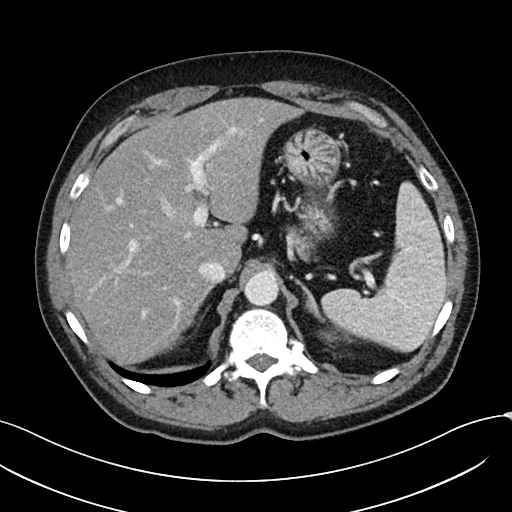
[im 83/95  soft-tissue]
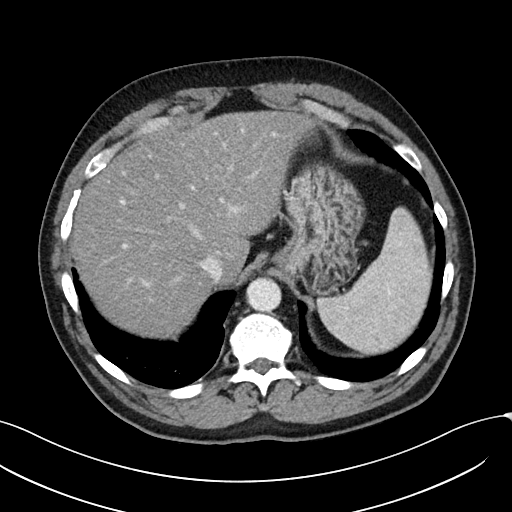
[im 89/95  soft-tissue]
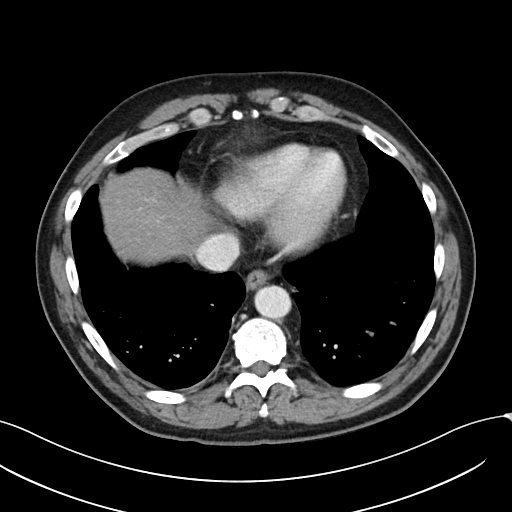

[Series 6: abdomen 3.0 mpr cor · coronal · 0.77mm/px · 3 of 104 slices shown]
[im 35/104  soft-tissue]
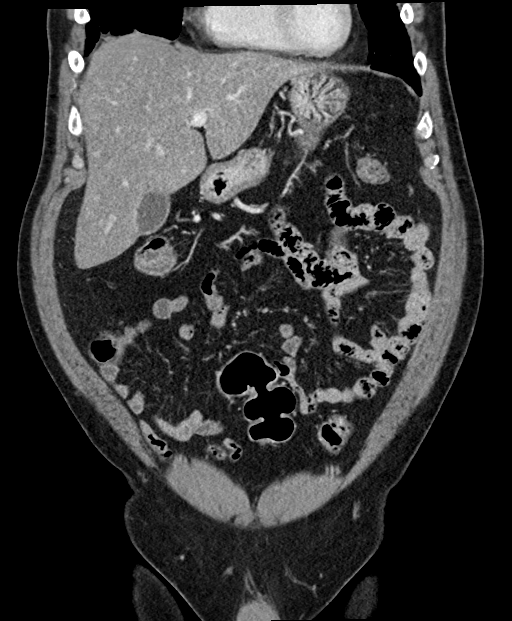
[im 46/104  soft-tissue]
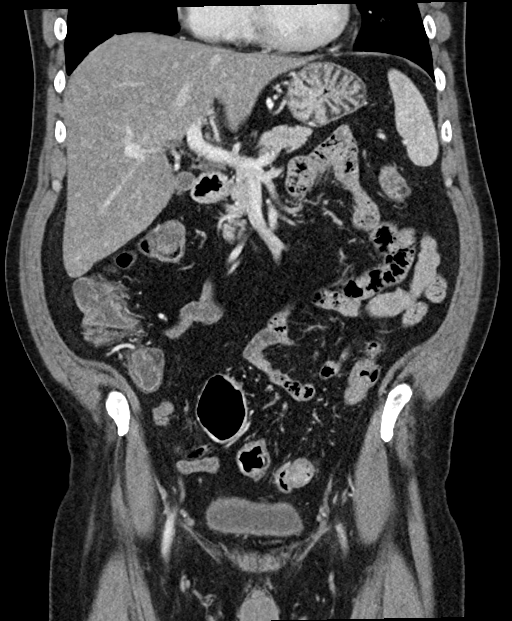
[im 58/104  soft-tissue]
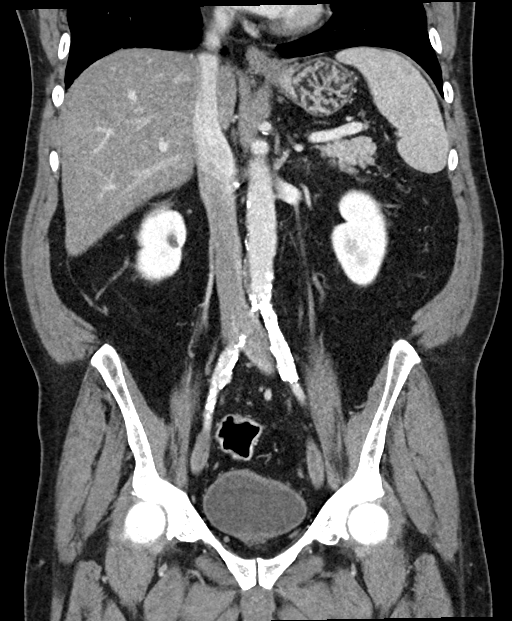

[16 of 46 positions shown; findings below may reference images not displayed]

FINDINGS: Lower chest: No acute abnormality.

Hepatobiliary: Decreased attenuation of the liver consistent with
fatty infiltration. No liver mass focal lesion. Normal gallbladder.
No bile duct dilation.

Pancreas: Unremarkable. No pancreatic ductal dilatation or
surrounding inflammatory changes.

Spleen: Normal in size without focal abnormality.

Adrenals/Urinary Tract: No adrenal masses. There are several
subcentimeter low-density renal masses bilaterally, largest on the
right, anterior mid to lower pole measuring 8 mm. These are
consistent with cysts and are stable from prior CT. No other renal
masses, no stones and no hydronephrosis. Normal ureters. Normal
bladder.

Stomach/Bowel: Stomach is within normal limits. Appendix appears
normal. No evidence of bowel wall thickening, distention, or
inflammatory changes.

Vascular/Lymphatic: Ectatic abdominal aorta, maximum AP diameter of
2.4 cm. Aortic atherosclerosis. Significant calcified
atherosclerosis in left common iliac artery with narrowing. These
findings are stable.

No enlarged lymph nodes.

Reproductive: Unremarkable.

Other: No abdominal wall hernia or abnormality. No abdominopelvic
ascites.

Musculoskeletal: No fracture or acute finding. No osteoblastic or
osteolytic lesions. Chronic avascular necrosis of the left femoral
head.
IMPRESSION: 1. No acute findings.  No evidence of bowel inflammation.
2. Hepatic steatosis.
3. Aortic atherosclerosis. Significant atherosclerotic narrowing of
the left common iliac artery similar to the prior CT.

## 2020-09-07 DIAGNOSIS — H5203 Hypermetropia, bilateral: Secondary | ICD-10-CM | POA: Diagnosis not present

## 2020-09-07 DIAGNOSIS — H524 Presbyopia: Secondary | ICD-10-CM | POA: Diagnosis not present

## 2020-09-10 DIAGNOSIS — J449 Chronic obstructive pulmonary disease, unspecified: Secondary | ICD-10-CM | POA: Diagnosis not present

## 2020-09-10 DIAGNOSIS — J45909 Unspecified asthma, uncomplicated: Secondary | ICD-10-CM | POA: Diagnosis not present

## 2020-09-17 DIAGNOSIS — J45909 Unspecified asthma, uncomplicated: Secondary | ICD-10-CM | POA: Diagnosis not present

## 2020-09-17 DIAGNOSIS — J449 Chronic obstructive pulmonary disease, unspecified: Secondary | ICD-10-CM | POA: Diagnosis not present

## 2020-09-22 ENCOUNTER — Other Ambulatory Visit: Payer: Self-pay | Admitting: Internal Medicine

## 2020-09-22 DIAGNOSIS — J301 Allergic rhinitis due to pollen: Secondary | ICD-10-CM

## 2020-09-23 ENCOUNTER — Telehealth (HOSPITAL_COMMUNITY): Payer: Self-pay | Admitting: *Deleted

## 2020-09-23 ENCOUNTER — Other Ambulatory Visit (HOSPITAL_COMMUNITY): Payer: Self-pay | Admitting: Physician Assistant

## 2020-09-23 DIAGNOSIS — F418 Other specified anxiety disorders: Secondary | ICD-10-CM

## 2020-09-23 MED ORDER — HYDROXYZINE HCL 25 MG PO TABS: 25.0000 mg | ORAL_TABLET | Freq: Three times a day (TID) | ORAL | 1 refills | Status: DC | PRN

## 2020-09-23 MED ORDER — SERTRALINE HCL 50 MG PO TABS: 50.0000 mg | ORAL_TABLET | Freq: Every day | ORAL | 1 refills | Status: DC

## 2020-09-23 NOTE — Telephone Encounter (Signed)
Refill request received. Medications sent to pharmacy of choice.

## 2020-09-23 NOTE — Progress Notes (Signed)
Refill request received. Medications sent to [facility].

## 2020-09-23 NOTE — Telephone Encounter (Signed)
[facility]  SENT REFILL REQUEST FOR sertraline (ZOLOFT) 50 MG tablet  && hydrOXYzine (ATARAX/VISTARIL) 25 MG tablet

## 2020-09-24 NOTE — Telephone Encounter (Signed)
Patient has since spoken with new provider, encounter being closed at this time.

## 2020-10-06 ENCOUNTER — Other Ambulatory Visit: Payer: Self-pay

## 2020-10-06 ENCOUNTER — Telehealth (INDEPENDENT_AMBULATORY_CARE_PROVIDER_SITE_OTHER): Payer: Medicare Other | Admitting: Physician Assistant

## 2020-10-06 ENCOUNTER — Encounter (HOSPITAL_COMMUNITY): Payer: Self-pay | Admitting: Physician Assistant

## 2020-10-06 DIAGNOSIS — F418 Other specified anxiety disorders: Secondary | ICD-10-CM

## 2020-10-06 MED ORDER — SERTRALINE HCL 100 MG PO TABS: 100.0000 mg | ORAL_TABLET | Freq: Every day | ORAL | 1 refills | Status: DC
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
## 2020-10-22 ENCOUNTER — Other Ambulatory Visit: Payer: Self-pay | Admitting: Internal Medicine

## 2020-10-22 ENCOUNTER — Telehealth: Payer: Self-pay

## 2020-10-22 DIAGNOSIS — F200 Paranoid schizophrenia: Secondary | ICD-10-CM

## 2020-10-22 NOTE — Telephone Encounter (Signed)
approved through 09/25/2021

## 2020-10-22 NOTE — Telephone Encounter (Signed)
Key: [id number]

## 2020-10-27 ENCOUNTER — Ambulatory Visit: Payer: Medicare Other | Admitting: Internal Medicine

## 2020-10-30 ENCOUNTER — Ambulatory Visit: Payer: Medicare Other | Admitting: Internal Medicine

## 2020-11-04 ENCOUNTER — Other Ambulatory Visit (HOSPITAL_COMMUNITY): Payer: Self-pay | Admitting: Physician Assistant

## 2020-11-04 ENCOUNTER — Telehealth (HOSPITAL_COMMUNITY): Payer: Self-pay | Admitting: *Deleted

## 2020-11-04 DIAGNOSIS — F418 Other specified anxiety disorders: Secondary | ICD-10-CM

## 2020-11-04 MED ORDER — SERTRALINE HCL 100 MG PO TABS: 100.0000 mg | ORAL_TABLET | Freq: Every day | ORAL | 1 refills | Status: DC

## 2020-11-04 NOTE — Telephone Encounter (Signed)
Provider was contacted by Eliezer Lofts, RMA regarding refill request. Patient's medication will be e-prescribed to pharmacy of choice.

## 2020-11-04 NOTE — Progress Notes (Signed)
Provider was contacted by Eliezer Lofts, RMA regarding refill request. Patient's medication will be e-prescribed to pharmacy of choice.

## 2020-11-04 NOTE — Telephone Encounter (Signed)
RX FAXED REFILL REQUEST  sertraline (ZOLOFT) 100 MG tablet 30 tablet 1 10/06/2020 10/06/2021

## 2020-11-11 ENCOUNTER — Ambulatory Visit (INDEPENDENT_AMBULATORY_CARE_PROVIDER_SITE_OTHER): Payer: Medicare Other

## 2020-11-11 DIAGNOSIS — Z0000 Encounter for general adult medical examination without abnormal findings: Secondary | ICD-10-CM | POA: Diagnosis not present

## 2020-11-11 IMAGING — DX DG CHEST 1V PORT
1 series · 1 of 1 positions shown · non-contrast
Comparison: March 03, 2019

CLINICAL DATA: Pain after fall

EXAM:
PORTABLE CHEST 1 VIEW

[chest ap]
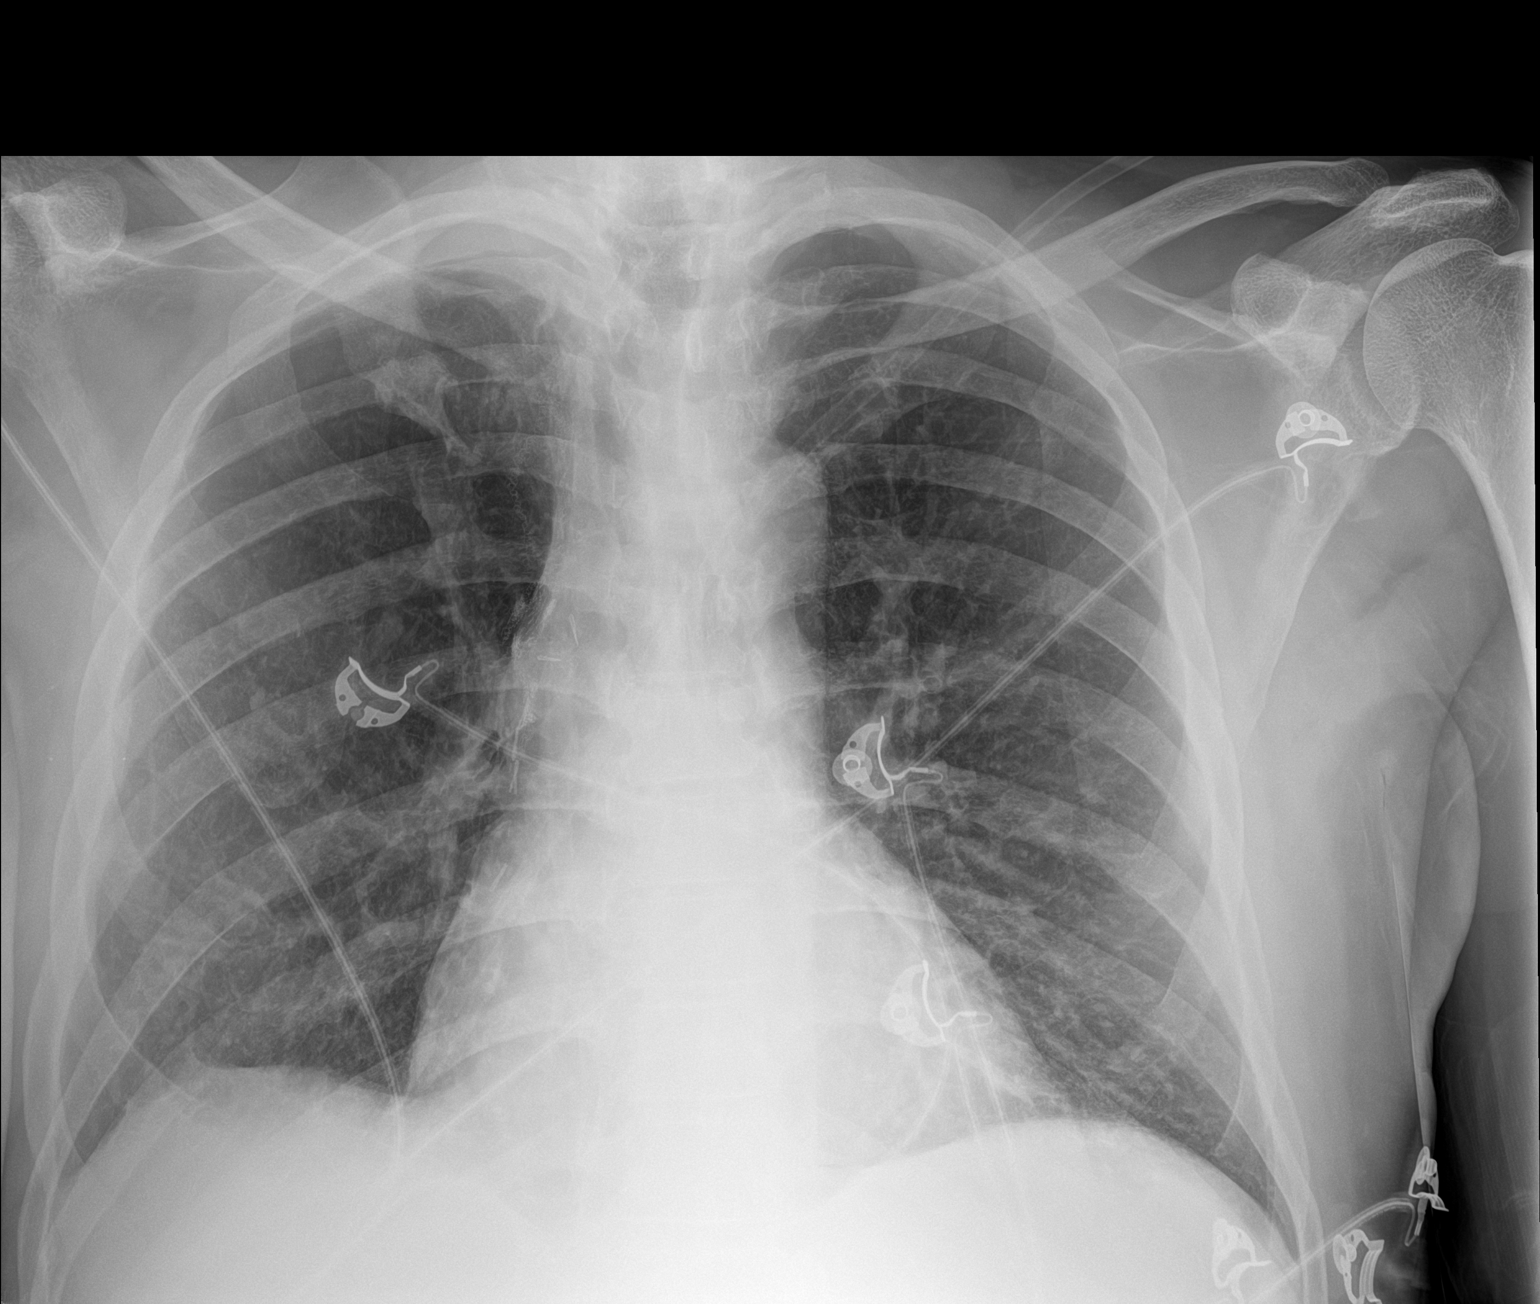

[1 of 1 positions shown; findings below may reference images not displayed]

FINDINGS: The heart size and mediastinal contours are within normal limits.
Both lungs are clear. The visualized skeletal structures are
unremarkable.
IMPRESSION: No active disease.
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
## 2020-11-11 NOTE — Patient Instructions (Signed)
[name] , Thank you for taking time to come for your Medicare Wellness Visit. I appreciate your ongoing commitment to your health goals. Please review the following plan we discussed and let me know if I can assist you in the future.   Screening recommendations/referrals: Colonoscopy: 02/22/2018; due every 3 years (2022) Recommended yearly ophthalmology/optometry visit for glaucoma screening and checkup Recommended yearly dental visit for hygiene and checkup  Vaccinations: Influenza vaccine: 07/20/2020 Pneumococcal vaccine: 06/06/2013, 10/12/2015 Tdap vaccine: 07/11/2015; due every 10 years (2026) Shingles vaccine: 04/05/2018, 06/06/2018   Covid-19: 06/06/2020, 01/01/2020, 06/02/2020  Advanced directives: Please bring a copy of your health care power of attorney and living will to the office at your convenience.  Conditions/risks identified: Yes; Reviewed health maintenance screenings with patient today and relevant education, vaccines, and/or referrals were provided. Please continue to do your personal lifestyle choices by: daily care of teeth and gums, regular physical activity (goal should be 5 days a week for 30 minutes), eat a healthy diet, avoid tobacco and drug use, limiting any alcohol intake, taking a low-dose aspirin (if not allergic or have been advised by your provider otherwise) and taking vitamins and minerals as recommended by your provider. Continue doing brain stimulating activities (puzzles, reading, adult coloring books, staying active) to keep memory sharp. Continue to eat heart healthy diet (full of fruits, vegetables, whole grains, lean protein, water--limit salt, fat, and sugar intake) and increase physical activity as tolerated.  Next appointment: Please schedule your next Medicare Wellness Visit with your Nurse Health Advisor in 1 year by calling [phone].   Preventive Care 40-64 Years, Male Preventive care refers to lifestyle choices and visits with your health care  provider that can promote health and wellness. What does preventive care include?  A yearly physical exam. This is also called an annual well check.  Dental exams once or twice a year.  Routine eye exams. Ask your health care provider how often you should have your eyes checked.  Personal lifestyle choices, including:  Daily care of your teeth and gums.  Regular physical activity.  Eating a healthy diet.  Avoiding tobacco and drug use.  Limiting alcohol use.  Practicing safe sex.  Taking low-dose aspirin every day starting at age 87. What happens during an annual well check? The services and screenings done by your health care provider during your annual well check will depend on your age, overall health, lifestyle risk factors, and family history of disease. Counseling  Your health care provider may ask you questions about your:  Alcohol use.  Tobacco use.  Drug use.  Emotional well-being.  Home and relationship well-being.  Sexual activity.  Eating habits.  Work and work Statistician. Screening  You may have the following tests or measurements:  Height, weight, and BMI.  Blood pressure.  Lipid and cholesterol levels. These may be checked every 5 years, or more frequently if you are over 47 years old.  Skin check.  Lung cancer screening. You may have this screening every year starting at age 56 if you have a 30-pack-year history of smoking and currently smoke or have quit within the past 15 years.  Fecal occult blood test (FOBT) of the stool. You may have this test every year starting at age 59.  Flexible sigmoidoscopy or colonoscopy. You may have a sigmoidoscopy every 5 years or a colonoscopy every 10 years starting at age 42.  Prostate cancer screening. Recommendations will vary depending on your family history and other risks.  Hepatitis C  blood test.  Hepatitis B blood test.  Sexually transmitted disease (STD) testing.  Diabetes screening. This  is done by checking your blood sugar (glucose) after you have not eaten for a while (fasting). You may have this done every 1-3 years. Discuss your test results, treatment options, and if necessary, the need for more tests with your health care provider. Vaccines  Your health care provider may recommend certain vaccines, such as:  Influenza vaccine. This is recommended every year.  Tetanus, diphtheria, and acellular pertussis (Tdap, Td) vaccine. You may need a Td booster every 10 years.  Zoster vaccine. You may need this after age 49.  Pneumococcal 13-valent conjugate (PCV13) vaccine. You may need this if you have certain conditions and have not been vaccinated.  Pneumococcal polysaccharide (PPSV23) vaccine. You may need one or two doses if you smoke cigarettes or if you have certain conditions. Talk to your health care provider about which screenings and vaccines you need and how often you need them. This information is not intended to replace advice given to you by your health care provider. Make sure you discuss any questions you have with your health care provider. Document Released: 10/09/2015 Document Revised: 06/01/2016 Document Reviewed: 07/14/2015 Elsevier Interactive Patient Education  2017 [place] Prevention in the Home Falls can cause injuries. They can happen to people of all ages. There are many things you can do to make your home safe and to help prevent falls. What can I do on the outside of my home?  Regularly fix the edges of walkways and driveways and fix any cracks.  Remove anything that might make you trip as you walk through a door, such as a raised step or threshold.  Trim any bushes or trees on the path to your home.  Use bright outdoor lighting.  Clear any walking paths of anything that might make someone trip, such as rocks or tools.  Regularly check to see if handrails are loose or broken. Make sure that both sides of any steps have  handrails.  Any raised decks and porches should have guardrails on the edges.  Have any leaves, snow, or ice cleared regularly.  Use sand or salt on walking paths during winter.  Clean up any spills in your garage right away. This includes oil or grease spills. What can I do in the bathroom?  Use night lights.  Install grab bars by the toilet and in the tub and shower. Do not use towel bars as grab bars.  Use non-skid mats or decals in the tub or shower.  If you need to sit down in the shower, use a plastic, non-slip stool.  Keep the floor dry. Clean up any water that spills on the floor as soon as it happens.  Remove soap buildup in the tub or shower regularly.  Attach bath mats securely with double-sided non-slip rug tape.  Do not have throw rugs and other things on the floor that can make you trip. What can I do in the bedroom?  Use night lights.  Make sure that you have a light by your bed that is easy to reach.  Do not use any sheets or blankets that are too big for your bed. They should not hang down onto the floor.  Have a firm chair that has side arms. You can use this for support while you get dressed.  Do not have throw rugs and other things on the floor that can make you trip. What  can I do in the kitchen?  Clean up any spills right away.  Avoid walking on wet floors.  Keep items that you use a lot in easy-to-reach places.  If you need to reach something above you, use a strong step stool that has a grab bar.  Keep electrical cords out of the way.  Do not use floor polish or wax that makes floors slippery. If you must use wax, use non-skid floor wax.  Do not have throw rugs and other things on the floor that can make you trip. What can I do with my stairs?  Do not leave any items on the stairs.  Make sure that there are handrails on both sides of the stairs and use them. Fix handrails that are broken or loose. Make sure that handrails are as long as  the stairways.  Check any carpeting to make sure that it is firmly attached to the stairs. Fix any carpet that is loose or worn.  Avoid having throw rugs at the top or bottom of the stairs. If you do have throw rugs, attach them to the floor with carpet tape.  Make sure that you have a light switch at the top of the stairs and the bottom of the stairs. If you do not have them, ask someone to add them for you. What else can I do to help prevent falls?  Wear shoes that:  Do not have high heels.  Have rubber bottoms.  Are comfortable and fit you well.  Are closed at the toe. Do not wear sandals.  If you use a stepladder:  Make sure that it is fully opened. Do not climb a closed stepladder.  Make sure that both sides of the stepladder are locked into place.  Ask someone to hold it for you, if possible.  Clearly mark and make sure that you can see:  Any grab bars or handrails.  First and last steps.  Where the edge of each step is.  Use tools that help you move around (mobility aids) if they are needed. These include:  Canes.  Walkers.  Scooters.  Crutches.  Turn on the lights when you go into a dark area. Replace any light bulbs as soon as they burn out.  Set up your furniture so you have a clear path. Avoid moving your furniture around.  If any of your floors are uneven, fix them.  If there are any pets around you, be aware of where they are.  Review your medicines with your doctor. Some medicines can make you feel dizzy. This can increase your chance of falling. Ask your doctor what other things that you can do to help prevent falls. This information is not intended to replace advice given to you by your health care provider. Make sure you discuss any questions you have with your health care provider. Document Released: 07/09/2009 Document Revised: 02/18/2016 Document Reviewed: 10/17/2014 Elsevier Interactive Patient Education  2017 [employer].

## 2020-11-16 ENCOUNTER — Encounter (HOSPITAL_COMMUNITY): Payer: Self-pay | Admitting: Physician Assistant

## 2020-11-18 ENCOUNTER — Other Ambulatory Visit: Payer: Self-pay | Admitting: Cardiology

## 2020-11-18 ENCOUNTER — Other Ambulatory Visit: Payer: Self-pay

## 2020-11-18 ENCOUNTER — Telehealth (INDEPENDENT_AMBULATORY_CARE_PROVIDER_SITE_OTHER): Payer: Medicare Other | Admitting: Physician Assistant

## 2020-11-18 ENCOUNTER — Telehealth (HOSPITAL_COMMUNITY): Payer: Self-pay | Admitting: *Deleted

## 2020-11-18 ENCOUNTER — Encounter (HOSPITAL_COMMUNITY): Payer: Self-pay | Admitting: Physician Assistant

## 2020-11-18 DIAGNOSIS — F410 Panic disorder [episodic paroxysmal anxiety] without agoraphobia: Secondary | ICD-10-CM

## 2020-11-18 DIAGNOSIS — F331 Major depressive disorder, recurrent, moderate: Secondary | ICD-10-CM | POA: Diagnosis not present

## 2020-11-18 DIAGNOSIS — F411 Generalized anxiety disorder: Secondary | ICD-10-CM | POA: Diagnosis not present

## 2020-11-18 MED ORDER — HYDROXYZINE HCL 50 MG PO TABS: 50.0000 mg | ORAL_TABLET | Freq: Three times a day (TID) | ORAL | 1 refills | Status: DC | PRN
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
## 2020-11-18 NOTE — Telephone Encounter (Signed)
REFILL REQUEST -- FILLED 11/18/20 ANY REFILLS WILL BE PLACED ON FILE hydrOXYzine (ATARAX/VISTARIL) 50 MG tablet 90 tablet

## 2020-11-19 ENCOUNTER — Other Ambulatory Visit (HOSPITAL_COMMUNITY): Payer: Self-pay | Admitting: Physician Assistant

## 2020-11-19 DIAGNOSIS — F411 Generalized anxiety disorder: Secondary | ICD-10-CM

## 2020-11-19 MED ORDER — HYDROXYZINE HCL 50 MG PO TABS: 50.0000 mg | ORAL_TABLET | Freq: Three times a day (TID) | ORAL | 1 refills | Status: DC | PRN

## 2020-11-19 NOTE — Telephone Encounter (Signed)
Provider was contacted by Clayton Lefort, RMA regarding patient's medication. Patient's medication will be e-prescribed to pharmacy of choice.

## 2020-11-19 NOTE — Progress Notes (Signed)
Provider was contacted by Clayton Lefort, RMA regarding patient's medication. Patient's medication will be e-prescribed to pharmacy of choice.

## 2020-12-22 ENCOUNTER — Encounter (HOSPITAL_COMMUNITY): Payer: Self-pay | Admitting: Physician Assistant

## 2020-12-22 ENCOUNTER — Other Ambulatory Visit: Payer: Self-pay

## 2020-12-22 ENCOUNTER — Telehealth (INDEPENDENT_AMBULATORY_CARE_PROVIDER_SITE_OTHER): Payer: Medicare Other | Admitting: Physician Assistant

## 2020-12-22 DIAGNOSIS — F331 Major depressive disorder, recurrent, moderate: Secondary | ICD-10-CM | POA: Diagnosis not present

## 2020-12-22 DIAGNOSIS — F410 Panic disorder [episodic paroxysmal anxiety] without agoraphobia: Secondary | ICD-10-CM | POA: Diagnosis not present

## 2020-12-22 DIAGNOSIS — F411 Generalized anxiety disorder: Secondary | ICD-10-CM | POA: Diagnosis not present

## 2020-12-22 IMAGING — DX DG CHEST 2V
2 series · 2 of 2 positions shown · non-contrast
Comparison: Radiograph 08/23/2019

CLINICAL DATA: Chest pain and shortness of breath, onset this
evening

EXAM:
CHEST - 2 VIEW

[chest pa]
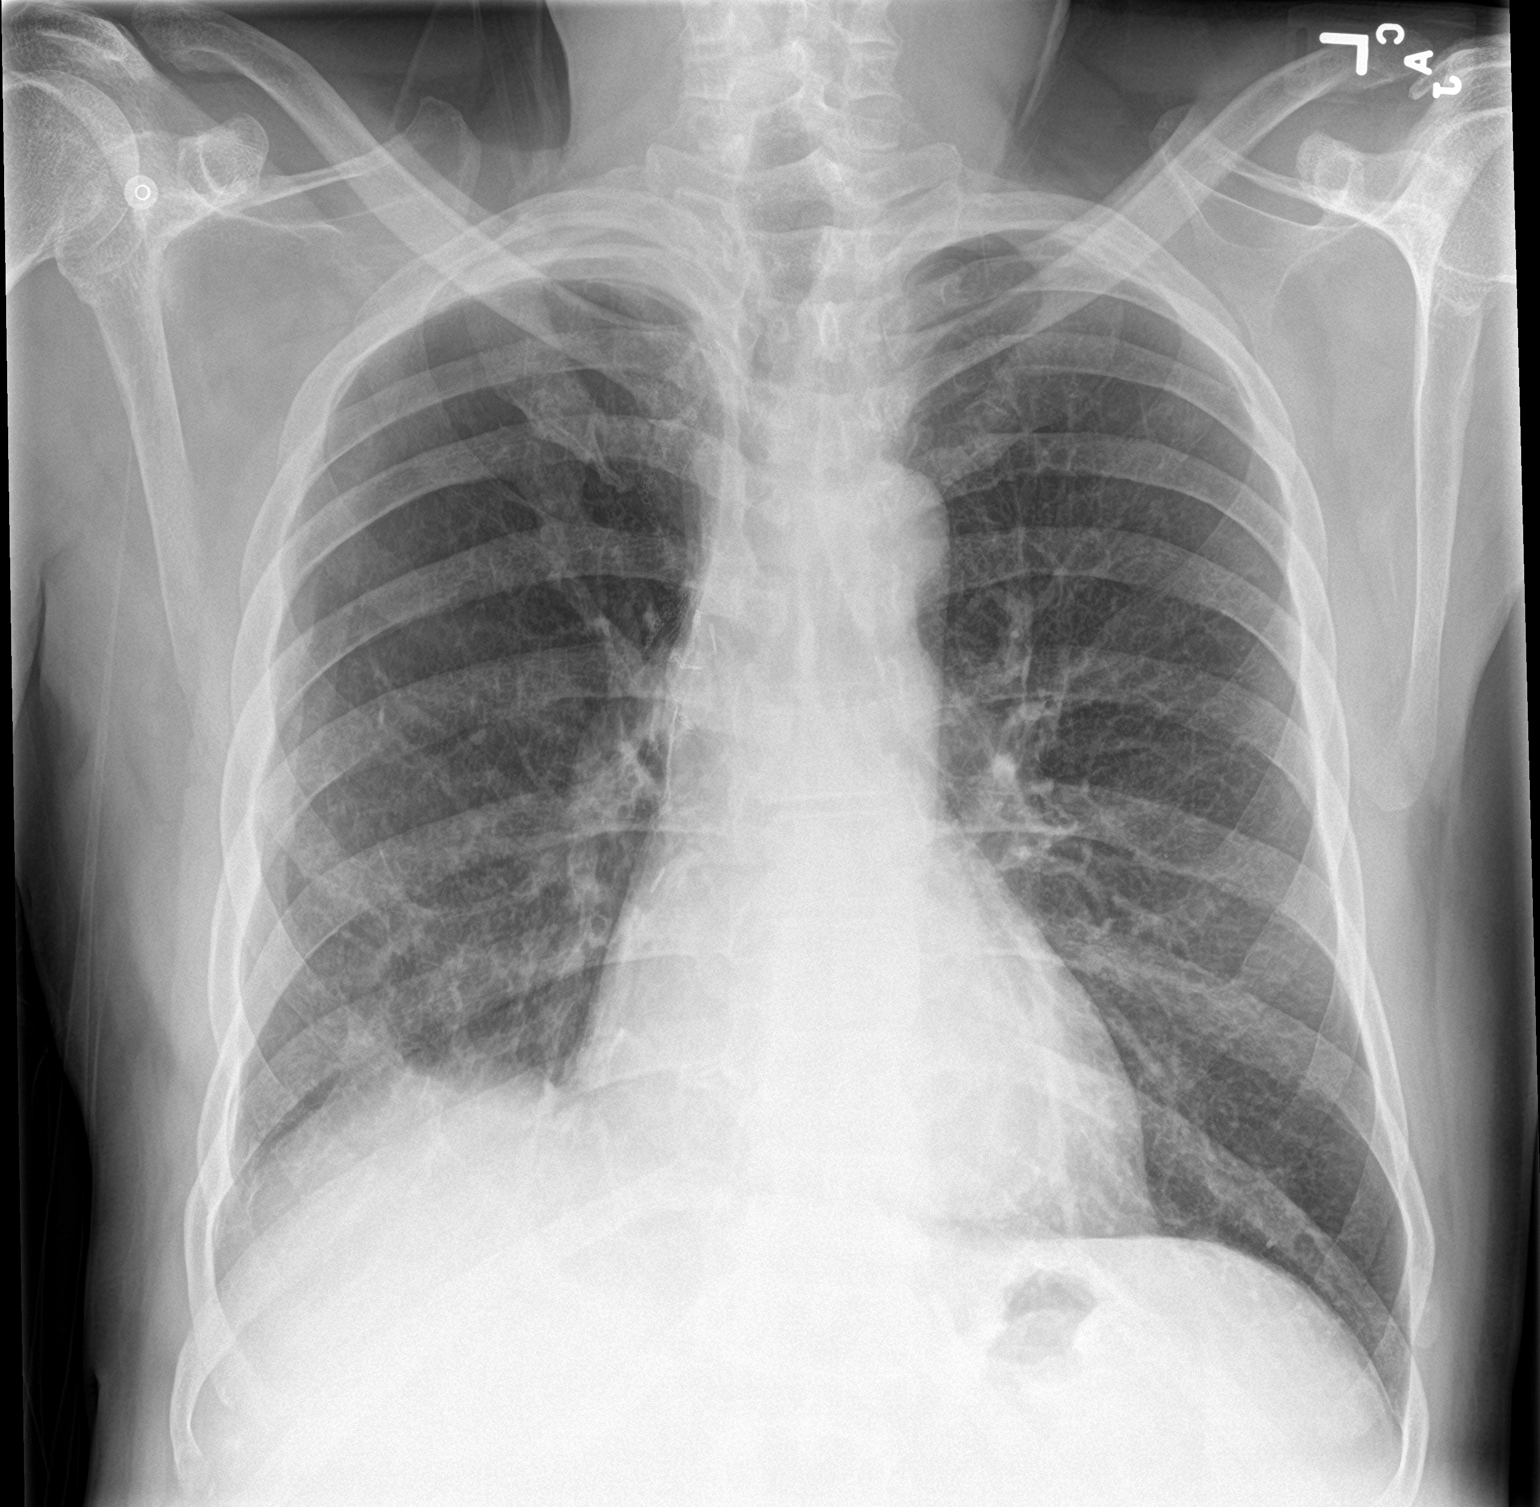

[chest lat]
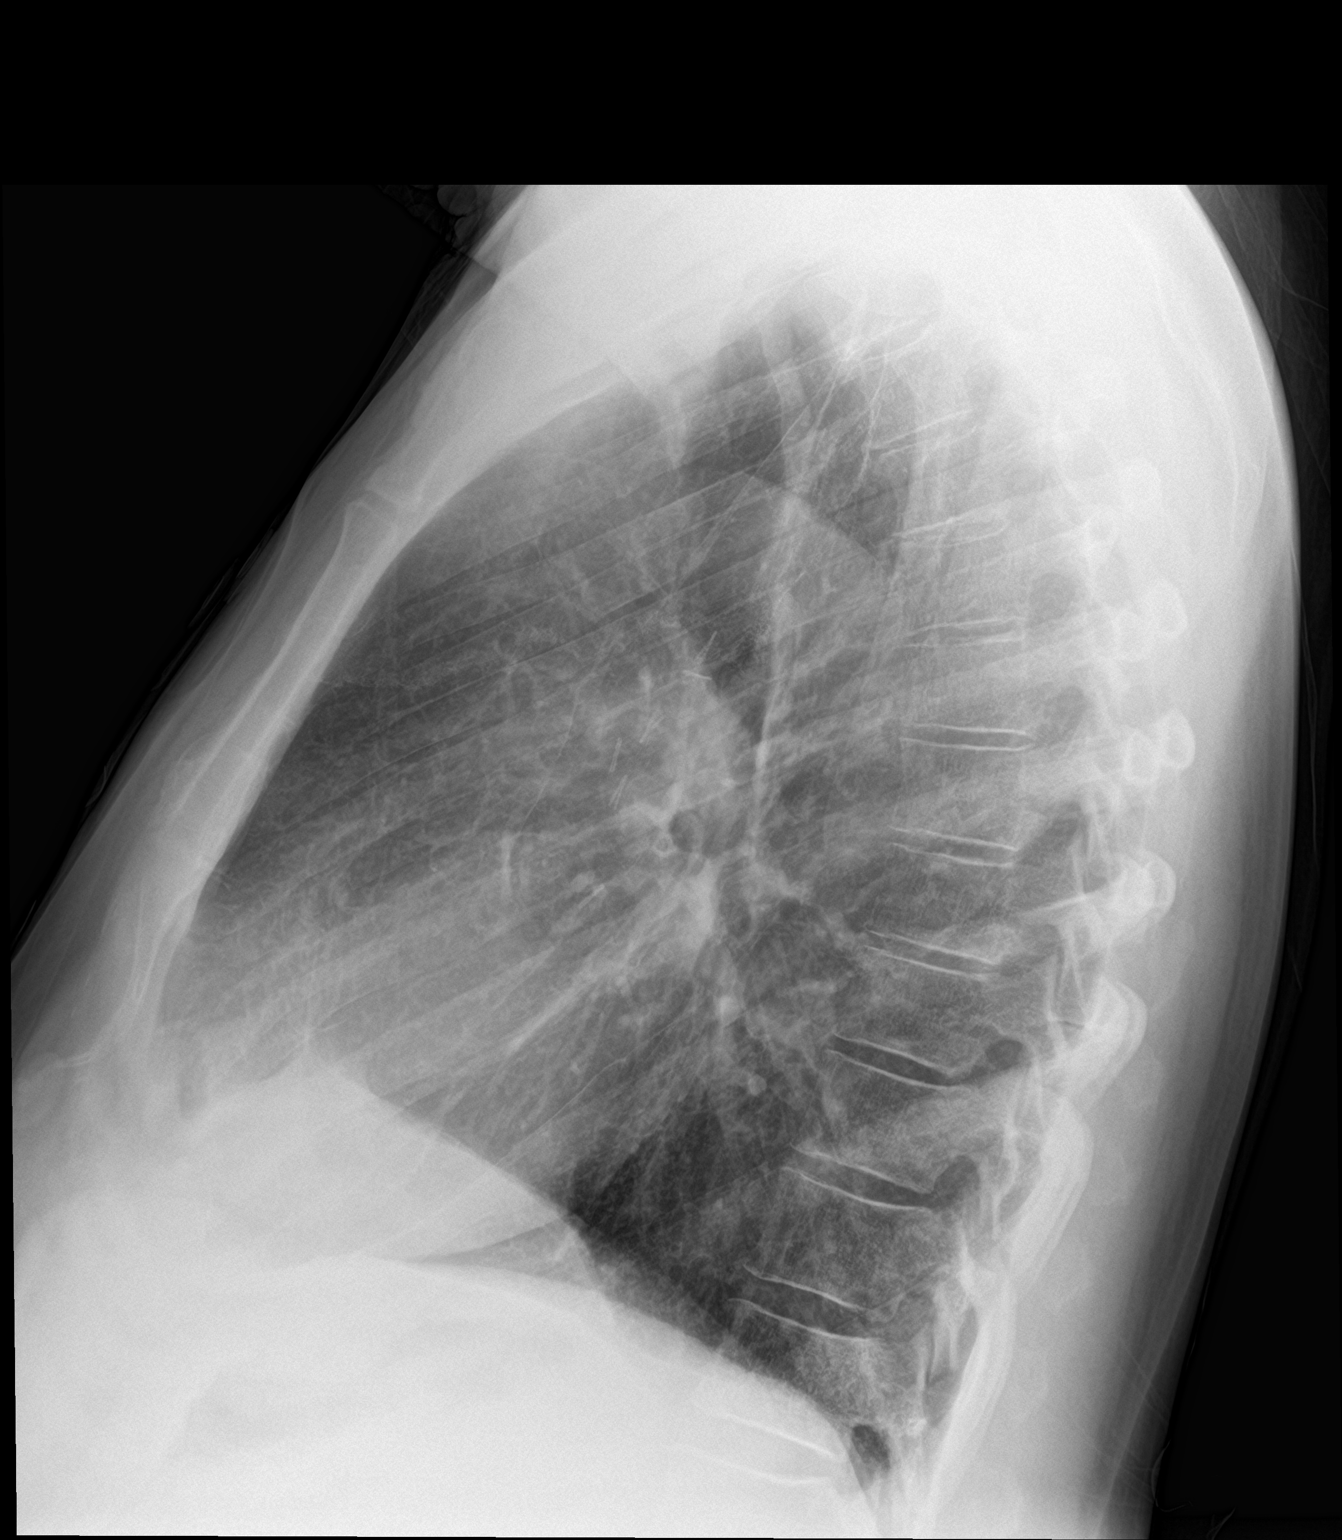

[2 of 2 positions shown; findings below may reference images not displayed]

FINDINGS: Chronic architectural distortion in the right lung base is similar
to comparison exams. There are coarsened interstitial and bronchitic
changes which are similar to prior. Mediastinal surgical material is
also unchanged. No focal consolidative opacity is seen. No
convincing features of edema. No pneumothorax or effusion.
Cardiomediastinal contours are unchanged from prior with a calcified
aorta. No acute osseous or soft tissue abnormality.
IMPRESSION: 1. Stable chronic architectural distortion in the right lung base.
2. No acute superimposed cardiopulmonary findings.
3. Stable mediastinal surgical and right hilar surgical changes.
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
## 2021-01-02 IMAGING — DX DG CHEST 2V
2 series · 2 of 2 positions shown · non-contrast
Comparison: October 03, 2019

CLINICAL DATA: Left flank pain.

EXAM:
CHEST - 2 VIEW

[chest pa]
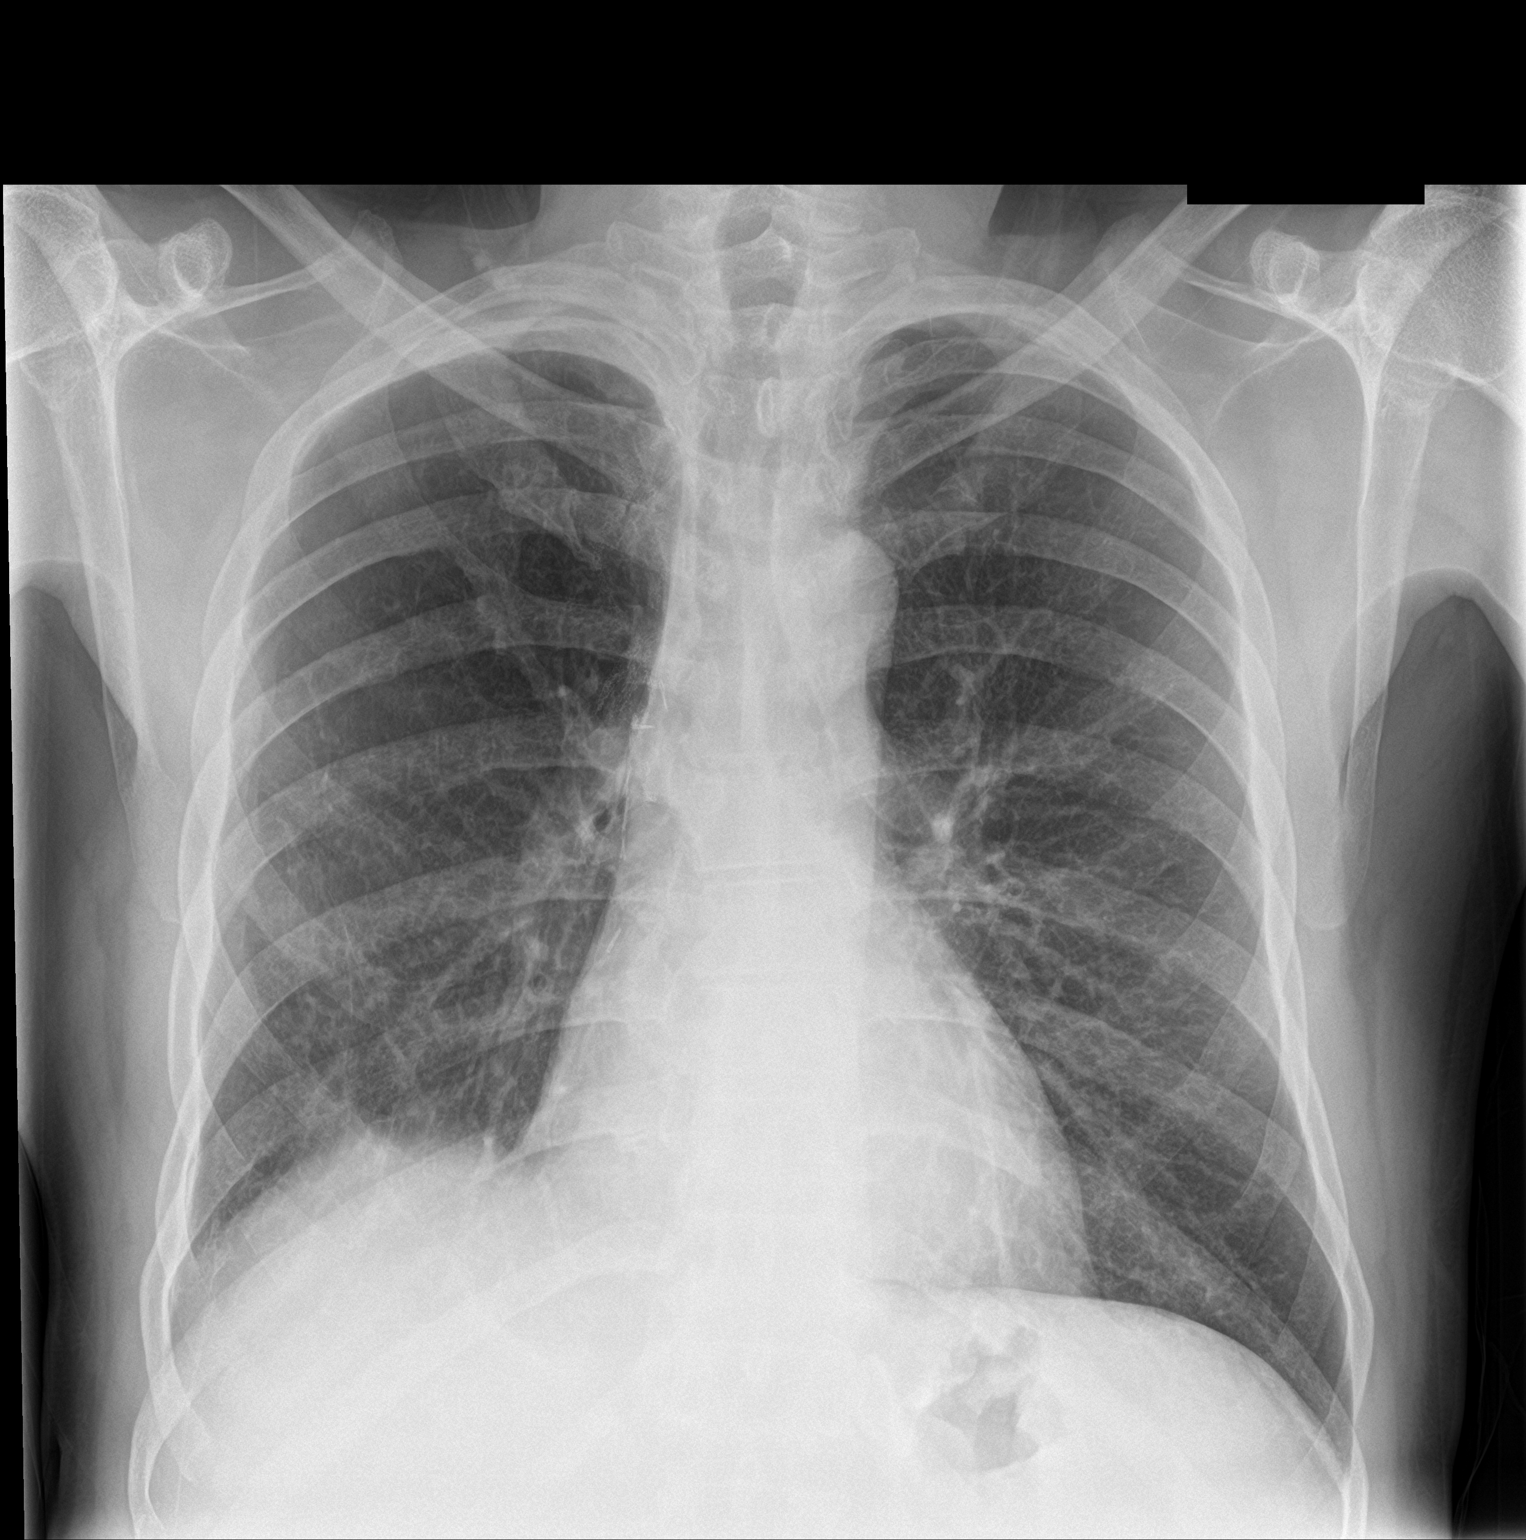

[chest lat]
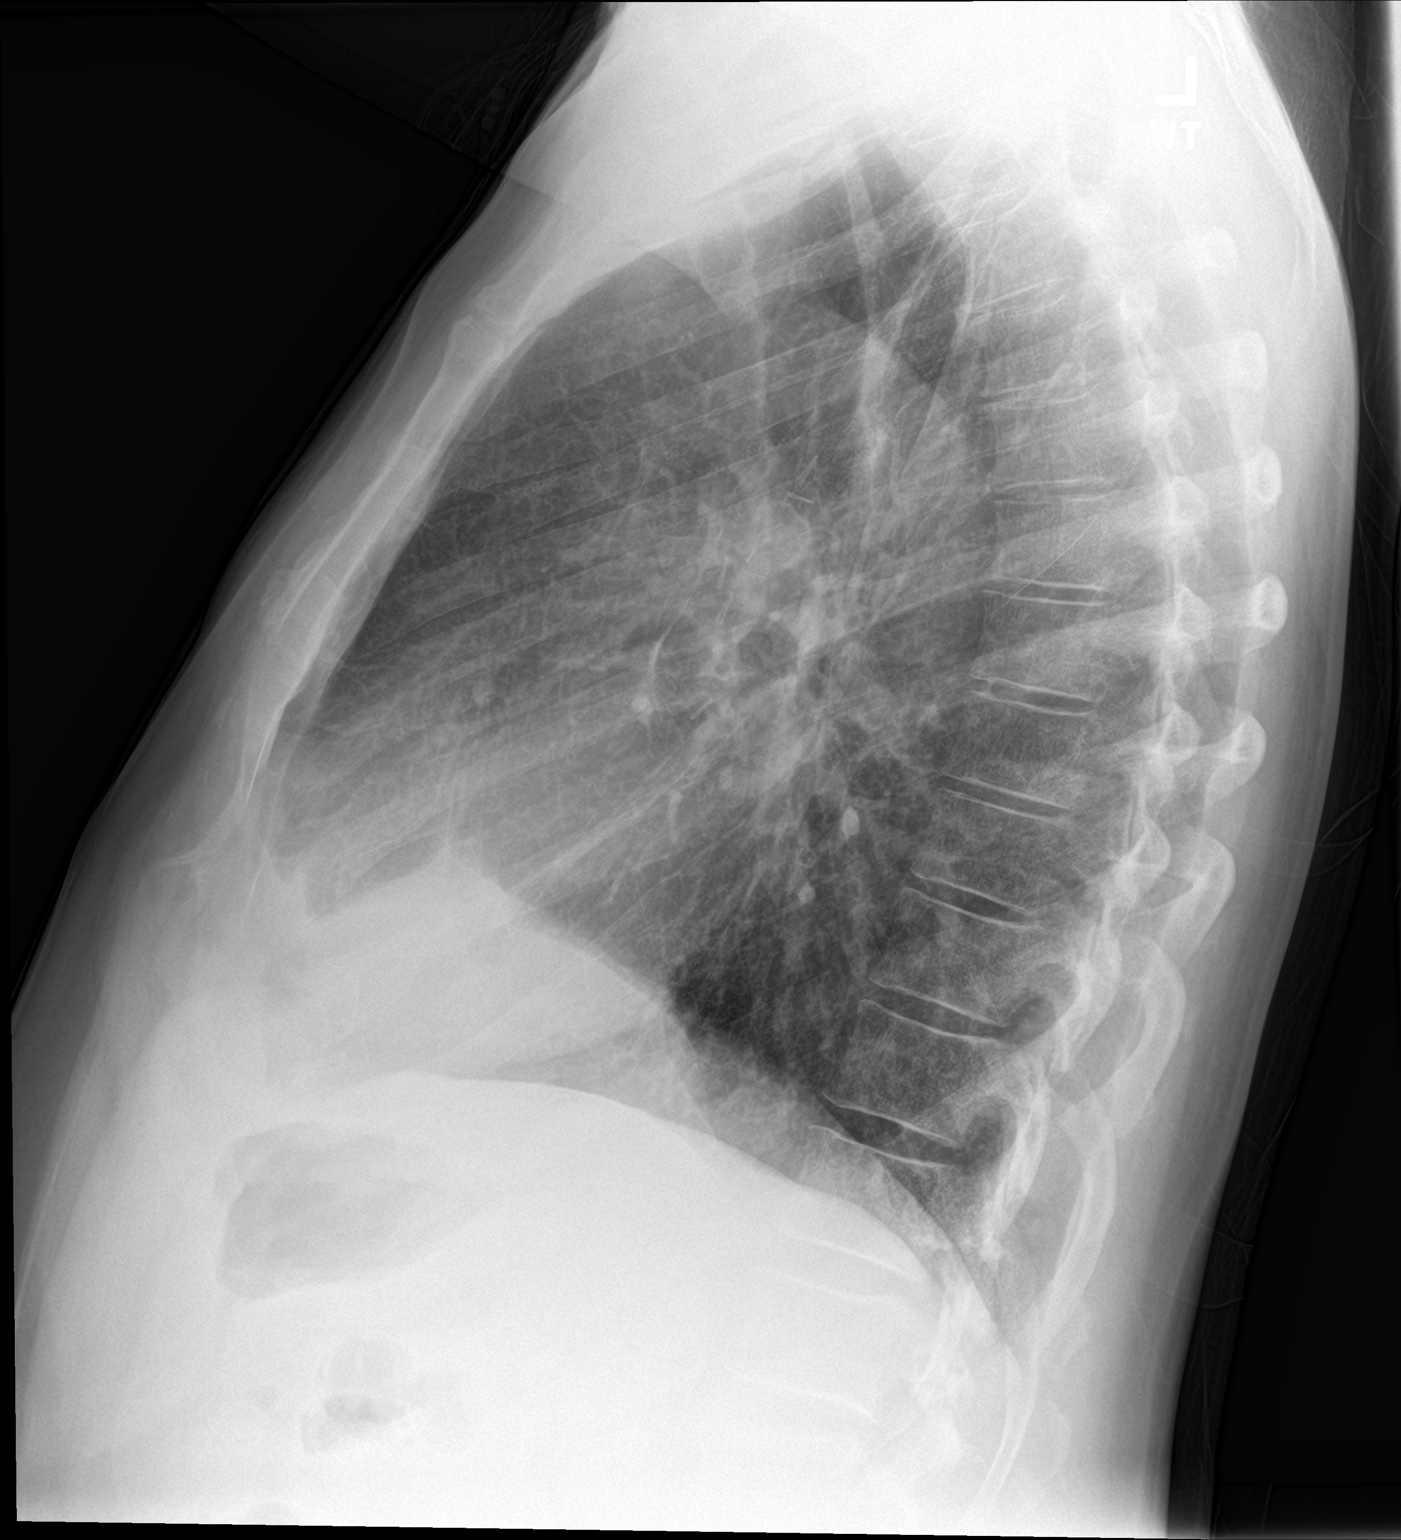

[2 of 2 positions shown; findings below may reference images not displayed]

FINDINGS: Stable changes in the right base consistent previous surgery. The
heart, hila, mediastinum, lungs, and pleura are otherwise unchanged
and unremarkable.
IMPRESSION: Stable postoperative changes on the right. No other acute
abnormalities.

## 2021-01-07 ENCOUNTER — Other Ambulatory Visit: Payer: Self-pay

## 2021-01-11 ENCOUNTER — Ambulatory Visit (INDEPENDENT_AMBULATORY_CARE_PROVIDER_SITE_OTHER): Payer: Medicare Other | Admitting: Internal Medicine

## 2021-01-11 ENCOUNTER — Inpatient Hospital Stay: Payer: Medicare Other

## 2021-01-11 ENCOUNTER — Encounter: Payer: Self-pay | Admitting: Internal Medicine

## 2021-01-11 ENCOUNTER — Other Ambulatory Visit: Payer: Self-pay

## 2021-01-11 ENCOUNTER — Telehealth: Payer: Self-pay | Admitting: Internal Medicine

## 2021-01-11 ENCOUNTER — Other Ambulatory Visit: Payer: Medicare Other

## 2021-01-11 VITALS — BP 120/66 | HR 76 | Temp 98.5°F | Ht 75.0 in | Wt 191.0 lb

## 2021-01-11 DIAGNOSIS — A530 Latent syphilis, unspecified as early or late: Secondary | ICD-10-CM

## 2021-01-11 DIAGNOSIS — J449 Chronic obstructive pulmonary disease, unspecified: Secondary | ICD-10-CM

## 2021-01-11 DIAGNOSIS — Z23 Encounter for immunization: Secondary | ICD-10-CM | POA: Diagnosis not present

## 2021-01-11 DIAGNOSIS — I700 Atherosclerosis of aorta: Secondary | ICD-10-CM | POA: Insufficient documentation

## 2021-01-11 DIAGNOSIS — L602 Onychogryphosis: Secondary | ICD-10-CM | POA: Diagnosis not present

## 2021-01-11 DIAGNOSIS — I10 Essential (primary) hypertension: Secondary | ICD-10-CM | POA: Diagnosis not present

## 2021-01-11 DIAGNOSIS — Z7252 High risk homosexual behavior: Secondary | ICD-10-CM

## 2021-01-11 DIAGNOSIS — E519 Thiamine deficiency, unspecified: Secondary | ICD-10-CM | POA: Diagnosis not present

## 2021-01-11 LAB — BASIC METABOLIC PANEL
BUN: 18 mg/dL (ref 6–23)
CO2: 28 mEq/L (ref 19–32)
Calcium: 9.3 mg/dL (ref 8.4–10.5)
Chloride: 107 mEq/L (ref 96–112)
Creatinine, Ser: 0.96 mg/dL (ref 0.40–1.50)
GFR: 85.64 mL/min (ref >60.00)
Glucose, Bld: 102 mg/dL — ABNORMAL HIGH (ref 70–99)
Potassium: 4 mEq/L (ref 3.5–5.1)
Sodium: 141 mEq/L (ref 135–145)

## 2021-01-11 LAB — CBC WITH DIFFERENTIAL/PLATELET
Basophils Absolute: 0 10*3/uL (ref 0.0–0.1)
Basophils Relative: 0.5 % (ref 0.0–3.0)
Eosinophils Absolute: 0.1 10*3/uL (ref 0.0–0.7)
Eosinophils Relative: 0.8 % (ref 0.0–5.0)
HCT: 48.2 % (ref 39.0–52.0)
Hemoglobin: 16.4 g/dL (ref 13.0–17.0)
Lymphocytes Relative: 32.5 % (ref 12.0–46.0)
Lymphs Abs: 2.2 10*3/uL (ref 0.7–4.0)
MCHC: 34.1 g/dL (ref 30.0–36.0)
MCV: 96.1 fl (ref 78.0–100.0)
Monocytes Absolute: 0.8 10*3/uL (ref 0.1–1.0)
Monocytes Relative: 12 % (ref 3.0–12.0)
Neutro Abs: 3.7 10*3/uL (ref 1.4–7.7)
Neutrophils Relative %: 54.2 % (ref 43.0–77.0)
Platelets: 94 10*3/uL — ABNORMAL LOW (ref 150.0–400.0)
RBC: 5.02 Mil/uL (ref 4.22–5.81)
RDW: 15.1 % (ref 11.5–15.5)
WBC: 6.8 10*3/uL (ref 4.0–10.5)

## 2021-01-11 NOTE — Patient Instructions (Signed)

## 2021-01-11 NOTE — Telephone Encounter (Signed)
Patient had a TB test done on 01/11/2021. He is requesting the results be emailed to him so he can keep it for his records.   Email: [email or web address]

## 2021-01-11 NOTE — Progress Notes (Signed)
Subjective:  Patient ID: [name], male    DOB: [birth year]  Age: 60 y.o. MRN: [id number]  CC: COPD, Osteoarthritis, Congestive Heart Failure, and Hypertension  This visit occurred during the SARS-CoV-2 public health emergency.  Safety protocols were in place, including screening questions prior to the visit, additional usage of staff PPE, and extensive cleaning of exam room while observing appropriate contact time as indicated for disinfecting solutions.    HPI SAL SPRATLEY presents for f/up -   He wants to continue taking PrEP - he has one partner but he does not know is his partner is monogamous.  Outpatient Medications Prior to Visit  Medication Sig Dispense Refill  . albuterol (PROAIR HFA) 108 (90 Base) MCG/ACT inhaler Inhale 2 puffs into the lungs every 4 (four) hours as needed for wheezing. 3 Inhaler 3  . busPIRone (BUSPAR) 15 MG tablet Take 1 tablet (15 mg total) by mouth in the morning and at bedtime. 60 tablet 1  . carvedilol (COREG) 6.25 MG tablet Take 1 tablet (6.25 mg total) by mouth 2 (two) times daily with a meal. 60 tablet 0  . cetirizine (ZYRTEC) 10 MG tablet Take 1 tablet (10 mg total) by mouth daily. 90 tablet 1  . Multiple Vitamin (MULTIVITAMIN WITH MINERALS) TABS tablet Take 1 tablet by mouth daily.    . pravastatin (PRAVACHOL) 40 MG tablet Take 1 tablet (40 mg total) by mouth daily. 30 tablet 0  . sertraline (ZOLOFT) 100 MG tablet Take 1 tablet (100 mg total) by mouth daily. 30 tablet 1  . traZODone (DESYREL) 100 MG tablet Take 3 tablets (300 mg total) by mouth at bedtime. 90 tablet 0  . emtricitabine-tenofovir AF (DESCOVY) 200-25 MG tablet Take 1 tablet by mouth daily. 90 tablet 0  . ENTRESTO 24-26 MG Take 1 tablet by mouth 2 (two) times daily. 60 tablet 1  . hydrOXYzine (ATARAX/VISTARIL) 50 MG tablet Take 1 tablet (50 mg total) by mouth 3 (three) times daily as needed. 90 tablet 1  . nicotine (NICODERM CQ - DOSED IN MG/24 HOURS) 21 mg/24hr patch Place 1  patch (21 mg total) onto the skin daily. 28 patch 0  . umeclidinium-vilanterol (ANORO ELLIPTA) 62.5-25 MCG/INH AEPB Inhale 1 puff into the lungs daily. 120 each 1  . meloxicam (MOBIC) 15 MG tablet Take 1 tablet (15 mg total) by mouth daily. (Patient not taking: Reported on 01/11/2021) 30 tablet 2  . thiamine (VITAMIN B-1) 50 MG tablet Take 1 tablet (50 mg total) by mouth daily. (Patient not taking: Reported on 01/11/2021) 90 tablet 1  . VRAYLAR capsule Take 1 capsule (3 mg total) by mouth daily. (Patient not taking: Reported on 01/11/2021) 90 capsule 1   No facility-administered medications prior to visit.    ROS Review of Systems  Constitutional: Negative.  Negative for appetite change, chills, diaphoresis, fatigue and fever.  HENT: Negative.  Negative for sinus pressure, sore throat and trouble swallowing.   Eyes: Negative.   Respiratory: Positive for shortness of breath. Negative for cough, chest tightness and wheezing.        No changes in SOB  Cardiovascular: Negative for chest pain, palpitations and leg swelling.  Gastrointestinal: Negative for abdominal pain, diarrhea, nausea and vomiting.  Endocrine: Negative.   Genitourinary: Negative.  Negative for difficulty urinating, dysuria, genital sores, hematuria, penile discharge, penile pain, penile swelling, scrotal swelling, testicular pain and urgency.  Musculoskeletal: Negative.  Negative for myalgias.  Skin: Negative.  Negative for color change and rash.  Neurological: Negative.  Negative for dizziness, weakness, light-headedness and headaches.  Hematological: Negative for adenopathy. Does not bruise/bleed easily.  Psychiatric/Behavioral: Negative.     Objective:  BP 120/66 (BP Location: Right Arm, Patient Position: Sitting, Cuff Size: Large)   Pulse 76   Temp 98.5 F (36.9 C) (Oral)   Ht 6\' 3"  (1.905 m)   Wt 191 lb (86.6 kg)   SpO2 97%   BMI 23.87 kg/m   BP Readings from Last 3 Encounters:  01/11/21 120/66  08/04/20 (!)  145/79  07/28/20 136/87    Wt Readings from Last 3 Encounters:  01/11/21 191 lb (86.6 kg)  07/20/20 189 lb (85.7 kg)  04/04/20 189 lb (85.7 kg)    Physical Exam Vitals reviewed.  Constitutional:      Appearance: Normal appearance.  HENT:     Nose: Nose normal.     Mouth/Throat:     Mouth: Mucous membranes are moist.  Eyes:     General: No scleral icterus.    Conjunctiva/sclera: Conjunctivae normal.  Cardiovascular:     Rate and Rhythm: Normal rate and regular rhythm.     Heart sounds: No murmur heard.   Pulmonary:     Effort: Pulmonary effort is normal.     Breath sounds: No stridor. No wheezing, rhonchi or rales.  Abdominal:     General: Abdomen is flat. Bowel sounds are normal. There is no distension.     Palpations: Abdomen is soft. There is no hepatomegaly, splenomegaly or mass.     Tenderness: There is no abdominal tenderness.  Musculoskeletal:        General: Normal range of motion.     Cervical back: Neck supple.     Right lower leg: No edema.     Left lower leg: No edema.  Lymphadenopathy:     Cervical: No cervical adenopathy.  Skin:    General: Skin is warm and dry.     Findings: No rash.  Neurological:     General: No focal deficit present.     Mental Status: He is alert.  Psychiatric:        Mood and Affect: Mood normal.        Behavior: Behavior normal.     Lab Results  Component Value Date   WBC 6.8 01/11/2021   HGB 16.4 01/11/2021   HCT 48.2 01/11/2021   PLT 94.0 (L) 01/11/2021   GLUCOSE 102 (H) 01/11/2021   CHOL 123 07/20/2020   TRIG 86.0 07/20/2020   HDL 35.20 (L) 07/20/2020   LDLDIRECT 122.0 11/30/2016   LDLCALC 71 07/20/2020   ALT 26 07/28/2020   AST 28 07/28/2020   NA 141 01/11/2021   K 4.0 01/11/2021   CL 107 01/11/2021   CREATININE 0.96 01/11/2021   BUN 18 01/11/2021   CO2 28 01/11/2021   TSH 0.90 06/26/2019   PSA 0.52 07/20/2020   INR 0.9 05/17/2020   HGBA1C 5.8 (A) 07/20/2020   MICROALBUR 11.8 (H) 12/04/2019    No  results found.  Assessment & Plan:   Cordarrius was seen today for copd, osteoarthritis, congestive heart failure and hypertension.  Diagnoses and all orders for this visit:  Syphili, latent- His RPR remains low at 1:1. This is reassuring. -     RPR; Future -     HIV Antibody (routine testing w rflx); Future -     HIV Antibody (routine testing w rflx) -     RPR  Thiamine deficiency- Will continue the vitamin B1 supplement. -  CBC with Differential/Platelet; Future -     Vitamin B1; Future -     Vitamin B1 -     CBC with Differential/Platelet -     thiamine (VITAMIN B-1) 50 MG tablet; Take 1 tablet (50 mg total) by mouth daily.  Long toenail -     Ambulatory referral to Podiatry  Aortic atherosclerosis ([place])- Risk factor modifications addressed.  High risk homosexual behavior- HIV and Hep B are negative. Will continue PrEP. -     Hepatitis B surface antigen; Future -     RPR; Future -     HIV Antibody (routine testing w rflx); Future -     HIV Antibody (routine testing w rflx) -     RPR -     Hepatitis B surface antigen -     emtricitabine-tenofovir AF (DESCOVY) 200-25 MG tablet; Take 1 tablet by mouth daily.  Essential hypertension, benign- His BP is well controlled. -     CBC with Differential/Platelet; Future -     Basic metabolic panel; Future -     Basic metabolic panel -     CBC with Differential/Platelet  COPD GOLD 0 copd- I encouraged him to quit smoking.  Will continue use the combination of a LABA/LAMA. -     umeclidinium-vilanterol (ANORO ELLIPTA) 62.5-25 MCG/INH AEPB; Inhale 1 puff into the lungs daily.  Other orders -     Pneumococcal polysaccharide vaccine 23-valent greater than or equal to 2yo subcutaneous/IM -     Rpr titer -     Fluorescent treponemal ab(fta)-IgG-bld   I have discontinued Otha T. Lerman's nicotine, meloxicam, and Vraylar. I am also having him maintain his albuterol, traZODone, pravastatin, carvedilol, multivitamin with minerals,  busPIRone, cetirizine, sertraline, Anoro Ellipta, thiamine, and emtricitabine-tenofovir AF.  Meds ordered this encounter  Medications  . umeclidinium-vilanterol (ANORO ELLIPTA) 62.5-25 MCG/INH AEPB    Sig: Inhale 1 puff into the lungs daily.    Dispense:  120 each    Refill:  1    This prescription was filled on 07/15/2020. Any refills authorized will be placed on file.  . thiamine (VITAMIN B-1) 50 MG tablet    Sig: Take 1 tablet (50 mg total) by mouth daily.    Dispense:  90 tablet    Refill:  1  . emtricitabine-tenofovir AF (DESCOVY) 200-25 MG tablet    Sig: Take 1 tablet by mouth daily.    Dispense:  90 tablet    Refill:  0     Follow-up: Return in about 6 months (around 07/13/2021).  Scarlette Calico, MD

## 2021-01-12 ENCOUNTER — Other Ambulatory Visit: Payer: Self-pay | Admitting: Cardiology

## 2021-01-13 ENCOUNTER — Telehealth (HOSPITAL_COMMUNITY): Payer: Self-pay | Admitting: *Deleted

## 2021-01-13 ENCOUNTER — Inpatient Hospital Stay: Payer: Medicare Other | Admitting: Internal Medicine

## 2021-01-13 NOTE — Telephone Encounter (Signed)
[facility] Refill Request hydrOXYzine (ATARAX/VISTARIL) 50 MG tablet

## 2021-01-14 ENCOUNTER — Encounter: Payer: Self-pay | Admitting: Internal Medicine

## 2021-01-14 MED ORDER — ANORO ELLIPTA 62.5-25 MCG/INH IN AEPB: 1.0000 | INHALATION_SPRAY | Freq: Every day | RESPIRATORY_TRACT | 1 refills | Status: DC

## 2021-01-14 MED ORDER — VITAMIN B-1 50 MG PO TABS: 50.0000 mg | ORAL_TABLET | Freq: Every day | ORAL | 1 refills | Status: DC

## 2021-01-15 ENCOUNTER — Other Ambulatory Visit (HOSPITAL_COMMUNITY): Payer: Self-pay | Admitting: Physician Assistant

## 2021-01-15 ENCOUNTER — Encounter: Payer: Self-pay | Admitting: Internal Medicine

## 2021-01-15 DIAGNOSIS — F411 Generalized anxiety disorder: Secondary | ICD-10-CM

## 2021-01-15 LAB — RPR TITER: RPR Titer: 1:1 {titer} — ABNORMAL HIGH

## 2021-01-15 LAB — HIV ANTIBODY (ROUTINE TESTING W REFLEX): HIV 1&2 Ab, 4th Generation: NONREACTIVE

## 2021-01-15 LAB — VITAMIN B1: Vitamin B1 (Thiamine): 11 nmol/L (ref 8–30)

## 2021-01-15 LAB — FLUORESCENT TREPONEMAL AB(FTA)-IGG-BLD: Fluorescent Treponemal ABS: REACTIVE — AB

## 2021-01-15 LAB — RPR: RPR Ser Ql: REACTIVE — AB

## 2021-01-15 LAB — HEPATITIS B SURFACE ANTIGEN: Hepatitis B Surface Ag: NONREACTIVE

## 2021-01-15 MED ORDER — EMTRICITABINE-TENOFOVIR AF 200-25 MG PO TABS: 1.0000 | ORAL_TABLET | Freq: Every day | ORAL | 0 refills | Status: DC

## 2021-01-15 MED ORDER — HYDROXYZINE HCL 50 MG PO TABS
50.0000 mg | ORAL_TABLET | Freq: Three times a day (TID) | ORAL | 1 refills | Status: DC | PRN
Start: 2021-01-15 — End: 2021-06-30

## 2021-01-15 NOTE — Progress Notes (Signed)
Provider was contacted by [name], RMA regarding patient's medication refill request. Patient's medication to be e-prescribed to pharmacy of choice.

## 2021-01-15 NOTE — Telephone Encounter (Signed)
Provider was contacted by [name], RMA regarding patient's medication refill request. Patient's medication to be e-prescribed to pharmacy of choice.

## 2021-01-16 ENCOUNTER — Encounter: Payer: Self-pay | Admitting: Internal Medicine

## 2021-01-18 ENCOUNTER — Ambulatory Visit: Payer: Medicare Other | Admitting: Podiatry

## 2021-01-27 ENCOUNTER — Ambulatory Visit (HOSPITAL_COMMUNITY): Payer: Medicare Other

## 2021-01-27 ENCOUNTER — Inpatient Hospital Stay: Payer: Medicare Other | Attending: Internal Medicine

## 2021-01-28 ENCOUNTER — Telehealth (INDEPENDENT_AMBULATORY_CARE_PROVIDER_SITE_OTHER): Payer: Medicare Other | Admitting: Physician Assistant

## 2021-01-28 ENCOUNTER — Other Ambulatory Visit: Payer: Self-pay

## 2021-01-28 ENCOUNTER — Telehealth: Payer: Self-pay

## 2021-01-28 DIAGNOSIS — F331 Major depressive disorder, recurrent, moderate: Secondary | ICD-10-CM | POA: Diagnosis not present

## 2021-01-28 DIAGNOSIS — F410 Panic disorder [episodic paroxysmal anxiety] without agoraphobia: Secondary | ICD-10-CM | POA: Diagnosis not present

## 2021-01-28 DIAGNOSIS — F411 Generalized anxiety disorder: Secondary | ICD-10-CM

## 2021-01-28 NOTE — Telephone Encounter (Signed)
Notification received that pt no showed his CT scan yesterday.  I have called the pt and his POA and left messages for them both regarding this missed appt and the number to call to r/s. I have also advised of his upcoming f/u appt on 5/12.

## 2021-01-30 ENCOUNTER — Encounter (HOSPITAL_COMMUNITY): Payer: Self-pay | Admitting: Physician Assistant
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
## 2021-02-01 ENCOUNTER — Telehealth: Payer: Self-pay | Admitting: Physician Assistant

## 2021-02-01 NOTE — Telephone Encounter (Signed)
Called pt to r/s appt per 5/9 sch msg. No answer. Left msg for pt to call back to r/s.

## 2021-02-01 NOTE — Telephone Encounter (Signed)
Pt called back to r/s appt. Pt aware of appt date and time.

## 2021-02-04 ENCOUNTER — Inpatient Hospital Stay: Payer: Medicare Other | Admitting: Physician Assistant

## 2021-02-05 ENCOUNTER — Telehealth: Payer: Self-pay | Admitting: Medical Oncology

## 2021-02-05 ENCOUNTER — Ambulatory Visit (HOSPITAL_COMMUNITY): Admission: RE | Admit: 2021-02-05 | Payer: Medicare Other | Source: Ambulatory Visit

## 2021-02-05 NOTE — Telephone Encounter (Signed)
LVM for [name] to call me back so I can give pt a message.

## 2021-02-05 NOTE — Telephone Encounter (Signed)
[name] will tell pt to call radiology to r/s CT scan and then call office and let us know.

## 2021-02-08 ENCOUNTER — Telehealth: Payer: Self-pay | Admitting: Physician Assistant
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
## 2021-02-08 NOTE — Telephone Encounter (Signed)
I tried to call the patient about his appointment tomorrow. The appointment is specifically to review his restaging CT scan which it does not appear is completed or even scheduled. I tried to call the patient to instruct him to schedule radiology scheduling. However, his voicemail is full and I am unable to leave a message.

## 2021-02-09 ENCOUNTER — Telehealth: Payer: Self-pay | Admitting: Physician Assistant

## 2021-02-09 ENCOUNTER — Inpatient Hospital Stay: Payer: Medicare Other | Admitting: Physician Assistant

## 2021-02-09 NOTE — Telephone Encounter (Signed)
The patient did not get his restaging CT scan performed. He also did not show up to his follow up appointment. I tried to call him but it goes straight to voicemail which is full. I tried to call his POA and left a detailed voicemail on how to reschedule his CT scan and then to call us back to reschedule his appointment. I asked that she can relay this to the patient. If she has any questions, she can call us back.

## 2021-02-12 ENCOUNTER — Telehealth: Payer: Self-pay | Admitting: Internal Medicine

## 2021-02-12 NOTE — Progress Notes (Signed)
  Chronic Care Management   Note  02/12/2021 Name: [name] MRN: [id number] DOB: Apr 14, 1961  [name] is a 60 y.o. year old male who is a primary care patient of [name], MD. I reached out to [name] by phone today in response to a referral sent by Mr. Jaylynn Siefert Mackley's PCP, [name], MD.   Mr. Persing was given information about Chronic Care Management services today including:  1. CCM service includes personalized support from designated clinical staff supervised by his physician, including individualized plan of care and coordination with other care providers 2. 24/7 contact phone numbers for assistance for urgent and routine care needs. 3. Service will only be billed when office clinical staff spend 20 minutes or more in a month to coordinate care. 4. Only one practitioner may furnish and bill the service in a calendar month. 5. The patient may stop CCM services at any time (effective at the end of the month) by phone call to the office staff.   Patient agreed to services and verbal consent obtained.   Follow up plan:  [place]
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
## 2021-02-18 ENCOUNTER — Other Ambulatory Visit: Payer: Self-pay | Admitting: Internal Medicine

## 2021-02-18 ENCOUNTER — Encounter: Payer: Self-pay | Admitting: Internal Medicine

## 2021-02-18 DIAGNOSIS — J301 Allergic rhinitis due to pollen: Secondary | ICD-10-CM

## 2021-02-18 DIAGNOSIS — I10 Essential (primary) hypertension: Secondary | ICD-10-CM

## 2021-02-18 DIAGNOSIS — E785 Hyperlipidemia, unspecified: Secondary | ICD-10-CM

## 2021-02-18 DIAGNOSIS — I739 Peripheral vascular disease, unspecified: Secondary | ICD-10-CM

## 2021-02-18 MED ORDER — CARVEDILOL 6.25 MG PO TABS: 6.2500 mg | ORAL_TABLET | Freq: Two times a day (BID) | ORAL | 1 refills | Status: DC

## 2021-02-18 MED ORDER — PRAVASTATIN SODIUM 40 MG PO TABS: 40.0000 mg | ORAL_TABLET | Freq: Every day | ORAL | 1 refills | Status: AC

## 2021-02-23 ENCOUNTER — Telehealth: Payer: Self-pay | Admitting: Physician Assistant

## 2021-02-23 ENCOUNTER — Inpatient Hospital Stay: Payer: Medicare Other

## 2021-02-23 ENCOUNTER — Inpatient Hospital Stay: Payer: Medicare Other | Admitting: Physician Assistant

## 2021-02-23 NOTE — Telephone Encounter (Signed)
R/s appts per 5/31 sch msg. Pt aware.  

## 2021-02-24 ENCOUNTER — Ambulatory Visit (INDEPENDENT_AMBULATORY_CARE_PROVIDER_SITE_OTHER): Payer: Medicare Other

## 2021-02-24 ENCOUNTER — Other Ambulatory Visit: Payer: Self-pay

## 2021-02-24 ENCOUNTER — Ambulatory Visit (INDEPENDENT_AMBULATORY_CARE_PROVIDER_SITE_OTHER): Payer: Medicare Other | Admitting: Podiatry

## 2021-02-24 ENCOUNTER — Ambulatory Visit: Payer: Medicare Other | Admitting: Podiatry

## 2021-02-24 DIAGNOSIS — S93491A Sprain of other ligament of right ankle, initial encounter: Secondary | ICD-10-CM | POA: Diagnosis not present

## 2021-02-24 DIAGNOSIS — M25571 Pain in right ankle and joints of right foot: Secondary | ICD-10-CM | POA: Diagnosis not present

## 2021-02-25 ENCOUNTER — Encounter: Payer: Self-pay | Admitting: Podiatry
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
## 2021-02-26 ENCOUNTER — Inpatient Hospital Stay: Payer: Medicare Other | Admitting: Physician Assistant

## 2021-03-10 NOTE — Progress Notes (Signed)
Chronic Care Management Pharmacy Note  03/11/2021 Name:  [name] MRN:  [id number] DOB:  1961/02/29  Summary: - Patient reports that he feels he is doing well, has no issues or concerns with current medications - Has not been checking blood pressures, will be out of entresto after this month, will need refills to last until next cardiology appointment 06/2021 - Does not currently have albuterol rescue inhaler, needs refilled - using nebulizer solution if needed at this time   Recommendations/Changes made from today's visit: Start: Saline Nasal Spray - as needed  Restart: Albuterol 108 mcg/act inhaler - use 2 puffs every 6 hours as needed for shortness of breath Stop: Diphenhydramine   Subjective: [name] is an 60 y.o. year old male who is a primary patient of [name], MD.  The CCM team was consulted for assistance with disease management and care coordination needs.    Engaged with patient by telephone for initial visit in response to provider referral for pharmacy case management and/or care coordination services.   Consent to Services:  The patient was given the following information about Chronic Care Management services today, agreed to services, and gave verbal consent: 1. CCM service includes personalized support from designated clinical staff supervised by the primary care provider, including individualized plan of care and coordination with other care providers 2. 24/7 contact phone numbers for assistance for urgent and routine care needs. 3. Service will only be billed when office clinical staff spend 20 minutes or more in a month to coordinate care. 4. Only one practitioner may furnish and bill the service in a calendar month. 5.The patient may stop CCM services at any time (effective at the end of the month) by phone call to the office staff. 6. The patient will be responsible for cost sharing (co-pay) of up to 20% of the service fee (after annual deductible is  met). Patient agreed to services and consent obtained.  Patient Care Team: [name], MD as PCP - General (Internal Medicine) Jerline Pain, MD as PCP - Cardiology (Cardiology) Warden Fillers, MD as Consulting Physician (Ophthalmology) Delice Bison Darnelle Maffucci, [facility] as Pharmacist (Pharmacist)  Recent office visits: 01/11/2021-  PCP visit - nicotine, meloxicam, and vraylar discontinued   Recent consult visits: 02/24/2021 - Dr. Posey Pronto DPM  - ankle sprain - patient given tri-lock ankle brace   Hospital visits: None in previous 6 months  Objective:  Lab Results  Component Value Date   CREATININE 0.96 01/11/2021   BUN 18 01/11/2021   GFR 85.64 01/11/2021   GFRNONAA >60 07/28/2020   GFRAA >60 05/17/2020   NA 141 01/11/2021   K 4.0 01/11/2021   CALCIUM 9.3 01/11/2021   CO2 28 01/11/2021   GLUCOSE 102 (H) 01/11/2021    Lab Results  Component Value Date/Time   HGBA1C 5.8 (A) 07/20/2020 10:12 AM   HGBA1C 5.9 (A) 03/03/2020 08:54 AM   HGBA1C 6.0 12/04/2019 08:56 AM   HGBA1C 8.1 (H) 06/26/2019 08:36 AM   FRUCTOSAMINE 180 06/23/2009 08:53 PM   GFR 85.64 01/11/2021 01:43 PM   GFR 96.95 07/20/2020 10:44 AM   MICROALBUR 11.8 (H) 12/04/2019 08:56 AM   MICROALBUR 0.2 10/08/2008 09:56 AM    Last diabetic Eye exam:  Lab Results  Component Value Date/Time   HMDIABEYEEXA No Retinopathy 01/08/2020 12:00 AM    Last diabetic Foot exam:  Lab Results  Component Value Date/Time   HMDIABFOOTEX normal 06/04/2012 12:00 AM     Lab Results  Component Value Date   CHOL 123 07/20/2020   HDL 35.20 (L) 07/20/2020   LDLCALC 71 07/20/2020   LDLDIRECT 122.0 11/30/2016   TRIG 86.0 07/20/2020   CHOLHDL 3 07/20/2020    Hepatic Function Latest Ref Rng & Units 07/28/2020 07/20/2020 05/17/2020  Total Protein 6.5 - 8.1 g/dL 7.2 6.5 6.9  Albumin 3.5 - 5.0 g/dL 3.8 4.1 3.5  AST 15 - 41 U/L 28 26 50(H)  ALT 0 - 44 U/L 26 26 44  Alk Phosphatase 38 - 126 U/L 67 58 60  Total Bilirubin 0.3 - 1.2 mg/dL  0.6 0.7 0.6  Bilirubin, Direct 0.0 - 0.3 mg/dL - 0.2 -    Lab Results  Component Value Date/Time   TSH 0.90 06/26/2019 08:36 AM   TSH 0.56 11/30/2016 11:37 AM    CBC Latest Ref Rng & Units 01/11/2021 07/28/2020 07/20/2020  WBC 4.0 - 10.5 K/uL 6.8 7.9 6.1  Hemoglobin 13.0 - 17.0 g/dL 16.4 17.5(H) 16.2  Hematocrit 39.0 - 52.0 % 48.2 50.7 48.5  Platelets 150.0 - 400.0 K/uL 94.0(L) 190 82.0(L)    No results found for: VD25OH  Clinical ASCVD: No  The ASCVD Risk score Mikey Bussing DC Jr., et al., 2013) failed to calculate for the following reasons:   The valid total cholesterol range is 130 to 320 mg/dL    Depression screen [facility] 2/9 03/11/2021 01/11/2021 01/11/2021  Decreased Interest '3 1 1  ' Down, Depressed, Hopeless '3 1 1  ' PHQ - 2 Score '6 2 2  ' Altered sleeping 0 0 -  Tired, decreased energy 0 0 -  Change in appetite 1 0 -  Feeling bad or failure about yourself  3 1 -  Trouble concentrating 0 0 -  Moving slowly or fidgety/restless 0 0 -  Suicidal thoughts 1 0 -  PHQ-9 Score 11 3 -  Difficult doing work/chores - - -  Some encounter information is confidential and restricted. Go to Review Flowsheets activity to see all data.  Some recent data might be hidden    Social History   Tobacco Use  Smoking Status Every Day   Packs/day: 1.00   Years: 38.00   Pack years: 38.00   Types: Cigarettes  Smokeless Tobacco Never  Tobacco Comments   one pack cigarettes daily   BP Readings from Last 3 Encounters:  01/11/21 120/66  08/04/20 (!) 145/79  07/28/20 136/87   Pulse Readings from Last 3 Encounters:  01/11/21 76  08/04/20 79  07/28/20 95   Wt Readings from Last 3 Encounters:  01/11/21 191 lb (86.6 kg)  07/20/20 189 lb (85.7 kg)  04/04/20 189 lb (85.7 kg)   BMI Readings from Last 3 Encounters:  01/11/21 23.87 kg/m  07/20/20 23.62 kg/m  04/04/20 24.27 kg/m    Assessment/Interventions: Review of patient past medical history, allergies, medications, health status, including  review of consultants reports, laboratory and other test data, was performed as part of comprehensive evaluation and provision of chronic care management services.   SDOH:  (Social Determinants of Health) assessments and interventions performed: Yes SDOH Interventions    Flowsheet Row Most Recent Value  SDOH Interventions   Depression Interventions/Treatment  Medication, Counseling, Currently on Treatment      SDOH Screenings   Alcohol Screen: Low Risk    Last Alcohol Screening Score (AUDIT): 0  Depression (PHQ2-9): Medium Risk   PHQ-2 Score: 11  Financial Resource Strain: Low Risk    Difficulty of Paying Living Expenses: Not hard at all  Food Insecurity:  No Food Insecurity   Worried About Charity fundraiser in the Last Year: Never true   Ran Out of Food in the Last Year: Never true  Housing: Low Risk    Last Housing Risk Score: 0  Physical Activity: Sufficiently Active   Days of Exercise per Week: 5 days   Minutes of Exercise per Session: 30 min  Social Connections: Moderately Isolated   Frequency of Communication with Friends and Family: More than three times a week   Frequency of Social Gatherings with Friends and Family: Once a week   Attends Religious Services: Never   Marine scientist or Organizations: Yes   Attends Archivist Meetings: Never   Marital Status: Never married  Stress: Stress Concern Present   Feeling of Stress : To some extent  Tobacco Use: High Risk   Smoking Tobacco Use: Every Day   Smokeless Tobacco Use: Never  Transportation Needs: No Transportation Needs   Lack of Transportation (Medical): No   Lack of Transportation (Non-Medical): No    CCM Care Plan  Allergies  Allergen Reactions   Ace Inhibitors Cough   Aspirin Other (See Comments)    Reaction:  Nose bleeds and GI bleeding    Clopidogrel Bisulfate Other (See Comments)    Reaction:  Nose bleeds    Crestor [Rosuvastatin Calcium] Other (See Comments)    Reaction:  Leg  cramps    Metformin And Related Diarrhea   Rosuvastatin Cough    Medications Reviewed Today     Reviewed by Felipa Furnace, DPM (Physician) on 02/25/21 at 1613  Med List Status: <None>   Medication Order Taking? Sig Documenting Provider Last Dose Status Informant  albuterol (PROAIR HFA) 108 (90 Base) MCG/ACT inhaler [id number] No Inhale 2 puffs into the lungs every 4 (four) hours as needed for wheezing. [name], MD Taking Active            Med Note Alroy Dust, TAYLOR A   Mon Jan 11, 2021  1:06 PM) Needs refill  busPIRone (BUSPAR) 15 MG tablet [id number] No Take 1 tablet (15 mg total) by mouth in the morning and at bedtime. Malachy Mood, PA Taking Active   carvedilol (COREG) 6.25 MG tablet [id number]  Take 1 tablet (6.25 mg total) by mouth 2 (two) times daily with a meal. [name], MD  Active   cetirizine (ZYRTEC) 10 MG tablet [id number] No Take 1 tablet (10 mg total) by mouth daily. [name], MD Taking Active   emtricitabine-tenofovir AF (DESCOVY) 200-25 MG tablet [id number]  Take 1 tablet by mouth daily. [name], MD  Active   fluticasone [facility]) 50 MCG/ACT nasal spray [id number]  Place 2 sprays into both nostrils daily. [name], MD  Active   hydrOXYzine (ATARAX/VISTARIL) 50 MG tablet [id number]  Take 1 tablet (50 mg total) by mouth 3 (three) times daily as needed. Malachy Mood, PA  Active   Multiple Vitamin (MULTIVITAMIN WITH MINERALS) TABS tablet [id number] No Take 1 tablet by mouth daily. Connye Burkitt, NP Taking Active   pravastatin (PRAVACHOL) 40 MG tablet [id number]  Take 1 tablet (40 mg total) by mouth daily. [name], MD  Active   sacubitril-valsartan [facility]) 24-26 MG [id number]  Take 1 tablet by mouth 2 (two) times daily. Please make overdue appt with Dr. Marlou Porch before anymore refills. Thank you 1st attempt Jerline Pain, MD  Active   sertraline (ZOLOFT) 100 MG tablet [id number] No Take 1  tablet (100 mg total) by mouth daily. Malachy Mood, PA Taking Active   thiamine (VITAMIN B-1) 50 MG tablet [id number]  Take 1 tablet (50 mg total) by mouth daily. [name], MD  Active   traZODone (DESYREL) 100 MG tablet [id number] No Take 3 tablets (300 mg total) by mouth at bedtime. Connye Burkitt, NP Taking Active   umeclidinium-vilanterol [facility] ELLIPTA) 62.5-25 MCG/INH AEPB [id number]  Inhale 1 puff into the lungs daily. [name], MD  Active             Patient Active Problem List   Diagnosis Date Noted   Long toenail 01/11/2021   Aortic atherosclerosis (HCC)    Generalized anxiety disorder 11/18/2020   Moderate episode of recurrent major depressive disorder ([place]) 11/18/2020   Panic disorder 11/18/2020   Schizophrenia ([place]) 05/18/2020   Thiamine deficiency 12/10/2019   Screening-pulmonary TB 12/04/2019   Irritable bowel syndrome with diarrhea 07/01/2019   Type II diabetes mellitus with manifestations ([place]) 06/26/2019   De Quervain's tenosynovitis, left 04/11/2018   Sleep apnea, primary central 11/30/2016   Insomnia w/ sleep apnea 11/30/2016   Syphili, latent 03/05/2016   Glaucoma suspect of both eyes 11/19/2015   Non-small cell carcinoma of lung, stage 1 ([place]) 10/12/2015   COPD GOLD II if use fev1/VC and still smoking  08/11/2015   High risk homosexual behavior 07/09/2014   PUD (peptic ulcer disease) 01/09/2013   Routine general medical examination at a health care facility 06/04/2012   Paranoid schizophrenia ([place]) 08/01/2011   DJD (degenerative joint disease) of knee 03/15/2011   Obstructive sleep apnea 11/29/2010   ERECTILE DYSFUNCTION, 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/2010   Allergic rhinitis 03/17/2010   Cigarette smoker 12/14/2009   HYPERTENSION, BENIGN 10/05/2009   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   PAD (peripheral artery disease) ([place]) 09/15/2009   Hx of adenomatous colonic polyps 12/03/2008   Hyperlipidemia with target LDL less than 130 06/23/2008   BPH associated with nocturia 06/23/2008     Immunization History  Administered Date(s) Administered   Hep A / Hep B 07/14/2009   Hepatitis A 07/21/2008   Influenza Inj Mdck Quad Pf 06/14/2019   Influenza Split 09/29/2011, 06/04/2012   Influenza Whole 06/12/2008, 06/23/2009, 08/05/2010   Influenza,inj,Quad PF,6+ Mos 06/06/2013, 07/08/2014, 06/01/2016, 10/03/2017, 06/05/2018, 07/20/2020   Influenza-Unspecified 07/28/2015   PFIZER(Purple Top)SARS-COV-2 Vaccination 12/05/2019, 01/01/2020, 06/02/2020   Pneumococcal Conjugate-13 06/06/2013   Pneumococcal Polysaccharide-23 12/06/2007, 10/12/2015, 01/11/2021   Td 04/09/2003   Tdap 06/06/2013, 07/11/2015   Zoster Recombinat (Shingrix) 04/05/2018, 06/06/2018    Conditions to be addressed/monitored:  Hypertension, Hyperlipidemia, Heart Failure, COPD, Allergic Rhinitis, and HIV prevention   Care Plan : CCM Care Plan  Updates made by Tomasa Blase, RPH since 03/11/2021 12:00 AM     Problem: HLD, HTN, HF, COPD, HIV PreP, Depression / Anxiety, Allergic Rhinitis   Priority: High  Onset Date: 03/11/2021     Long-Range Goal: Disease Management   Start Date: 03/11/2021  Expected End Date: 09/10/2021  This Visit's Progress: On track  Priority: High  Note:   Current Barriers:  Unable to independently monitor therapeutic efficacy Suboptimal therapeutic regimen for COPD (does not have rescue inhaler at this time)  Pharmacist Clinical Goal(s):  Patient will achieve adherence to monitoring guidelines and medication adherence to achieve therapeutic efficacy achieve control of Blood pressure and HR as evidenced by blood pressure monitoring logs maintain control of LDL as evidenced by next lipid panel results  contact provider office for questions/concerns as  evidenced notation of same in electronic health record through collaboration with PharmD and provider.   Interventions: 1:1 collaboration with [name], MD regarding development and update of comprehensive plan of care as  evidenced by provider attestation and co-signature Inter-disciplinary care team collaboration (see longitudinal plan of care) Comprehensive medication review performed; medication list updated in electronic medical record  Hyperlipidemia: (LDL goal < 130) -Controlled -Last LDL 75 mg/dL - 07/20/2020 -Current treatment: Pravastatin 19m daily  -Medications previously tried: livalo, lovaza, pitavastatin, crestor   -Current dietary patterns: moderation of red meats / fried and fatty foods  -Current exercise habits: none at this time  -Educated on Cholesterol goals;  Benefits of statin for ASCVD risk reduction; Importance of limiting foods high in cholesterol; Exercise goal of 150 minutes per week; -Counseled on diet and exercise extensively Recommended to continue current medication  Heart Failure / Hypertension(Goal: manage symptoms and prevent exacerbations /BP goal <130/80) -Controlled -Last ejection fraction: 35-40% (Date: 08/03/2020) -HF type: Systolic -Current treatment: Carvedilol 6.221mtwice daily  Entresto 24-268m 1 tablet twice daily  -Medications previously tried: losartan   -Current home BP/HR readings: unknown at this time, last office readings were:  -Current dietary habits: eats sodium reduced diet, smoking 1 ppd, reports abstinence from alcohol for 2 months  -Current exercise habits: none at this time  -Educated on BP goals and benefits of medications for prevention of heart attack, stroke and kidney damage; Daily salt intake goal < 2300 mg; Exercise goal of 150 minutes per week; Importance of home blood pressure monitoring; Proper BP monitoring technique; Symptoms of hypotension and importance of maintaining adequate hydration; -Counseled to monitor BP at home at least once weekly , document, and provide log at future appointments -Educated on Benefits of medications for managing symptoms and prolonging life Importance of blood pressure control -Counseled on diet  and exercise extensively Recommended to continue current medication  COPD (Goal: control symptoms and prevent exacerbations) -Controlled -Current treatment  Anoro Ellipta (umeclidinium-vilanterol) 62.5-25mcg - 1 puff daily Proair HFA 108 mcg/act - 2 puffs every 4 hours as needed   Albuterol 0.083% nebulizer solution - 1 vial twice daily if needed  -Medications previously tried: symbicort, breo, spiriva,   -Gold Grade: Gold 2 (FEV1 50-79%) -Current COPD Classification:  A (low sx, <2 exacerbations/yr) -MMRC/CAT score: 2 -Pulmonary function testing: 11/09/2017 -Exacerbations requiring treatment in last 6 months: none  -Patient reports consistent use of maintenance inhaler -Frequency of rescue inhaler use: uses twice daily (nebulizer) -4 times a week -Counseled on Proper inhaler technique; Benefits of consistent maintenance inhaler use When to use rescue inhaler Differences between maintenance and rescue inhalers -Counseled on proper use of rescue inhaler - will reach out to PCP to refill albuterol HFA inhaler  - Recommended for patient smoking cessation to improve breathing / lung capacity   Depression/Anxiety (Goal: mood control/ prevention of anxiety attacks ) -Not ideally controlled -Current treatment: Buspirone 16m26mwce daily  Hydroxyzine 50mg26m tablet 3 times daily if needed  Sertraline 100mg 52my  Trazodone 300mg n59mly at bedtime  -Medications previously tried/failed: paroxetine, quetiapine, temazepam, viibryd, vraylar, thorazine, latuda -PHQ9: 11 -[facility] -Educated on Benefits of medication for symptom control Benefits of cognitive-behavioral therapy with or without medication -Recommended to continue current medication Recommended Follow up with behavioral health provider - patient has appointment next month   PreP (Goal: Prevention of contraction of HIV) -Controlled - Last HIV antibody test - 01/11/2021 - Negative   -  Current treatment  Descovy (Emtricitabine-tenofovir)- 200-19m - 1 tablet daily  -Medications previously tried: truvada   -Recommended to continue current medication  Allergic Rhinitis (Goal: Prevention / treatment of allergy attacks) -Not ideally controlled -Current treatment  Cetirizine 144mdaily  Fluticasone 5058mact nasal spray - 2 sprays into each nostril daily - not currently using  Diphenhydramine 16m9mily as needed  -Medications previously tried: n/a  -Recommended for patient to start using fluticasone when needed, advised for patient to stop using diphenhydramine due to excessive sedation that it can cause along with duplication of therapy with cetirizine - Patient reports to dried blood in tissues when blowing his nose in the AM, advised for use of saline nasal spray to keep mucous membranes hydrated to prevent irritation / nose bleeds   Health Maintenance -Vaccine gaps: COVID booster  -Current therapy:  Multivitamin - 1 tablet daily  Thiamine 50mg41mly  Turmeric 500mg 46mtablet twice daily  Acetaminophen 500mg -98mablets twice daily  Neuriva - 1 tablet daily  Saline Nasal spray - daily as needed  -Educated on Herbal supplement research is limited and benefits usually cannot be proven Cost vs benefit of each product must be carefully weighed by individual consumer Supplements may interfere with prescription drugs -Patient is satisfied with current therapy and denies issues -Recommended to continue current medication   Patient Goals/Self-Care Activities Patient will:  - take medications as prescribed focus on medication adherence by missing 0 doses of medications within timeframe of next follow up  check blood pressure weekly, document, and provide at future appointments target a minimum of 150 minutes of moderate intensity exercise weekly  Follow Up Plan: Telephone follow up appointment with care management team member scheduled for: The patient has been  provided with contact information for the care management team and has been advised to call with any health related questions or concerns.        Medication Assistance: None required.  Patient affirms current coverage meets needs.  Compliance/Adherence/Medication fill history: Care Gaps: Colonoscopy Foot Exam, A1c, yearly Eye exam   [facility] [facility]:  [facility] [address] [phone] pill box? No - feels that he can manage without  Pt endorses 100% compliance  Care Plan and Follow Up Patient Decision:  Patient agrees to Care Plan and Follow-up.  Plan: Telephone follow up appointment with care management team member scheduled for:  2 months  and The patient has been provided with contact information for the care management team and has been advised to call with any health related questions or concerns.   Karsen Fellows Tomasa BlaseD Clinical Pharmacist, [place]/22 4:29 PM

## 2021-03-11 ENCOUNTER — Other Ambulatory Visit: Payer: Self-pay

## 2021-03-11 ENCOUNTER — Ambulatory Visit (INDEPENDENT_AMBULATORY_CARE_PROVIDER_SITE_OTHER): Payer: Medicare Other

## 2021-03-11 ENCOUNTER — Other Ambulatory Visit: Payer: Self-pay | Admitting: Internal Medicine

## 2021-03-11 DIAGNOSIS — J301 Allergic rhinitis due to pollen: Secondary | ICD-10-CM

## 2021-03-11 DIAGNOSIS — J449 Chronic obstructive pulmonary disease, unspecified: Secondary | ICD-10-CM

## 2021-03-11 DIAGNOSIS — I10 Essential (primary) hypertension: Secondary | ICD-10-CM | POA: Diagnosis not present

## 2021-03-11 DIAGNOSIS — Z7252 High risk homosexual behavior: Secondary | ICD-10-CM

## 2021-03-11 DIAGNOSIS — G4700 Insomnia, unspecified: Secondary | ICD-10-CM

## 2021-03-11 DIAGNOSIS — F411 Generalized anxiety disorder: Secondary | ICD-10-CM

## 2021-03-11 DIAGNOSIS — G4730 Sleep apnea, unspecified: Secondary | ICD-10-CM

## 2021-03-11 MED ORDER — ALBUTEROL SULFATE HFA 108 (90 BASE) MCG/ACT IN AERS: 2.0000 | INHALATION_SPRAY | RESPIRATORY_TRACT | 3 refills | Status: DC | PRN

## 2021-03-11 NOTE — Patient Instructions (Signed)
Visit Information   PATIENT GOALS:   Goals Addressed             This Visit's Progress    Track and Manage My Blood Pressure-Hypertension       Timeframe:  Long-Range Goal Priority:  High Start Date: 03/11/2021                            Expected End Date:  09/10/2021                     Follow Up Date 05/11/2021   - check blood pressure weekly - choose a place to take my blood pressure (home, clinic or office, retail store) - write blood pressure results in a log or diary    Why is this important?   You won't feel high blood pressure, but it can still hurt your blood vessels.  High blood pressure can cause heart or kidney problems. It can also cause a stroke.  Making lifestyle changes like losing a little weight or eating less salt will help.  Checking your blood pressure at home and at different times of the day can help to control blood pressure.  If the doctor prescribes medicine remember to take it the way the doctor ordered.  Call the office if you cannot afford the medicine or if there are questions about it.     Notes: Patient to reach out to clinic should blood pressure average >130/80      Track and Manage My Symptoms-COPD       Timeframe:  Long-Range Goal Priority:  High Start Date:   03/11/2021                          Expected End Date:  09/10/2021                     Follow Up Date 05/11/2021    - develop a rescue plan - eliminate symptom triggers at home - follow rescue plan if symptoms flare-up - keep follow-up appointments    Why is this important?   Tracking your symptoms and other information about your health helps your doctor plan your care.  Write down the symptoms, the time of day, what you were doing and what medicine you are taking.  You will soon learn how to manage your symptoms.     Notes: Patient to reach out should use of rescue inhaler become more frequent / should patient have an exacerbation          Consent to CCM Services: Mr.  Lyles was given information about Chronic Care Management services today including:  CCM service includes personalized support from designated clinical staff supervised by his physician, including individualized plan of care and coordination with other care providers 24/7 contact phone numbers for assistance for urgent and routine care needs. Service will only be billed when office clinical staff spend 20 minutes or more in a month to coordinate care. Only one practitioner may furnish and bill the service in a calendar month. The patient may stop CCM services at any time (effective at the end of the month) by phone call to the office staff. The patient will be responsible for cost sharing (co-pay) of up to 20% of the service fee (after annual deductible is met).  Patient agreed to services and verbal consent obtained.   Patient verbalizes understanding of instructions  provided today and agrees to view in [place].   Telephone follow up appointment with care management team member scheduled for: The patient has been provided with contact information for the care management team and has been advised to call with any health related questions or concerns.   Tomasa Blase, PharmD Clinical Pharmacist, [place]   CLINICAL CARE PLAN: Patient Care Plan: CCM Care Plan     Problem Identified: HLD, HTN, HF, COPD, HIV PreP, Depression / Anxiety, Allergic Rhinitis   Priority: High  Onset Date: 03/11/2021     Long-Range Goal: Disease Management   Start Date: 03/11/2021  Expected End Date: 09/10/2021  This Visit's Progress: On track  Priority: High  Note:   Current Barriers:  Unable to independently monitor therapeutic efficacy Suboptimal therapeutic regimen for COPD (does not have rescue inhaler at this time)  Pharmacist Clinical Goal(s):  Patient will achieve adherence to monitoring guidelines and medication adherence to achieve therapeutic efficacy achieve control of Blood  pressure and HR as evidenced by blood pressure monitoring logs maintain control of LDL as evidenced by next lipid panel results  contact provider office for questions/concerns as evidenced notation of same in electronic health record through collaboration with PharmD and provider.   Interventions: 1:1 collaboration with [name], MD regarding development and update of comprehensive plan of care as evidenced by provider attestation and co-signature Inter-disciplinary care team collaboration (see longitudinal plan of care) Comprehensive medication review performed; medication list updated in electronic medical record  Hyperlipidemia: (LDL goal < 130) -Controlled -Last LDL 75 mg/dL - 07/20/2020 -Current treatment: Pravastatin 72m daily  -Medications previously tried: livalo, lovaza, pitavastatin, crestor   -Current dietary patterns: moderation of red meats / fried and fatty foods  -Current exercise habits: none at this time  -Educated on Cholesterol goals;  Benefits of statin for ASCVD risk reduction; Importance of limiting foods high in cholesterol; Exercise goal of 150 minutes per week; -Counseled on diet and exercise extensively Recommended to continue current medication  Heart Failure / Hypertension(Goal: manage symptoms and prevent exacerbations /BP goal <130/80) -Controlled -Last ejection fraction: 35-40% (Date: 08/03/2020) -HF type: Systolic -Current treatment: Carvedilol 6.249mtwice daily  Entresto 24-2679m 1 tablet twice daily  -Medications previously tried: losartan   -Current home BP/HR readings: unknown at this time, last office readings were:  -Current dietary habits: eats sodium reduced diet, smoking 1 ppd, reports abstinence from alcohol for 2 months  -Current exercise habits: none at this time  -Educated on BP goals and benefits of medications for prevention of heart attack, stroke and kidney damage; Daily salt intake goal < 2300 mg; Exercise goal of 150  minutes per week; Importance of home blood pressure monitoring; Proper BP monitoring technique; Symptoms of hypotension and importance of maintaining adequate hydration; -Counseled to monitor BP at home at least once weekly , document, and provide log at future appointments -Educated on Benefits of medications for managing symptoms and prolonging life Importance of blood pressure control -Counseled on diet and exercise extensively Recommended to continue current medication  COPD (Goal: control symptoms and prevent exacerbations) -Controlled -Current treatment  Anoro Ellipta (umeclidinium-vilanterol) 62.5-25mcg - 1 puff daily Proair HFA 108 mcg/act - 2 puffs every 4 hours as needed   Albuterol 0.083% nebulizer solution - 1 vial twice daily if needed  -Medications previously tried: symbicort, breo, spiriva,   -Gold Grade: Gold 2 (FEV1 50-79%) -Current COPD Classification:  A (low sx, <2 exacerbations/yr) -MMRC/CAT score: 2 -Pulmonary function testing: 11/09/2017 -  Exacerbations requiring treatment in last 6 months: none  -Patient reports consistent use of maintenance inhaler -Frequency of rescue inhaler use: uses twice daily (nebulizer) -4 times a week -Counseled on Proper inhaler technique; Benefits of consistent maintenance inhaler use When to use rescue inhaler Differences between maintenance and rescue inhalers -Counseled on proper use of rescue inhaler - will reach out to PCP to refill albuterol HFA inhaler  - Recommended for patient smoking cessation to improve breathing / lung capacity   Depression/Anxiety (Goal: mood control/ prevention of anxiety attacks ) -Not ideally controlled -Current treatment: Buspirone 5m tiwce daily  Hydroxyzine 568m- 1 tablet 3 times daily if needed  Sertraline 10051maily  Trazodone 300m98mghtly at bedtime  -Medications previously tried/failed: paroxetine, quetiapine, temazepam, viibryd, vraylar, thorazine, latuda -PHQ9: 11 -Following with  [facility] for behavioral health -Educated on Benefits of medication for symptom control Benefits of cognitive-behavioral therapy with or without medication -Recommended to continue current medication Recommended Follow up with behavioral health provider - patient has appointment next month   PreP (Goal: Prevention of contraction of HIV) -Controlled - Last HIV antibody test - 01/11/2021 - Negative  -Current treatment  Descovy (Emtricitabine-tenofovir)- 200-25mg74m tablet daily  -Medications previously tried: truvada   -Recommended to continue current medication  Allergic Rhinitis (Goal: Prevention / treatment of allergy attacks) -Not ideally controlled -Current treatment  Cetirizine 10mg 90my  Fluticasone 50mcg/22mnasal spray - 2 sprays into each nostril daily - not currently using  Diphenhydramine 25mg da60mas needed  -Medications previously tried: n/a  -Recommended for patient to start using fluticasone when needed, advised for patient to stop using diphenhydramine due to excessive sedation that it can cause along with duplication of therapy with cetirizine - Patient reports to dried blood in tissues when blowing his nose in the AM, advised for use of saline nasal spray to keep mucous membranes hydrated to prevent irritation / nose bleeds   Health Maintenance -Vaccine gaps: COVID booster  -Current therapy:  Multivitamin - 1 tablet daily  Thiamine 50mg dai74mTurmeric 500mg - 1 49met twice daily  Acetaminophen 500mg - 2 t42mts twice daily  Neuriva - 1 tablet daily  Saline Nasal spray - daily as needed  -Educated on Herbal supplement research is limited and benefits usually cannot be proven Cost vs benefit of each product must be carefully weighed by individual consumer Supplements may interfere with prescription drugs -Patient is satisfied with current therapy and denies issues -Recommended to continue current medication   Patient Goals/Self-Care  Activities Patient will:  - take medications as prescribed focus on medication adherence by missing 0 doses of medications within timeframe of next follow up  check blood pressure weekly, document, and provide at future appointments target a minimum of 150 minutes of moderate intensity exercise weekly  Follow Up Plan: Telephone follow up appointment with care management team member scheduled for: The patient has been provided with contact information for the care management team and has been advised to call with any health related questions or concerns.

## 2021-03-13 ENCOUNTER — Other Ambulatory Visit: Payer: Self-pay | Admitting: Cardiology

## 2021-03-15 ENCOUNTER — Other Ambulatory Visit: Payer: Self-pay | Admitting: Internal Medicine

## 2021-03-15 DIAGNOSIS — J301 Allergic rhinitis due to pollen: Secondary | ICD-10-CM

## 2021-03-16 ENCOUNTER — Encounter (HOSPITAL_COMMUNITY): Payer: Self-pay

## 2021-03-16 ENCOUNTER — Inpatient Hospital Stay: Payer: Medicare Other | Attending: Physician Assistant

## 2021-03-16 ENCOUNTER — Ambulatory Visit (HOSPITAL_COMMUNITY)
Admission: RE | Admit: 2021-03-16 | Discharge: 2021-03-16 | Disposition: A | Discharge status: Home / Self Care | Payer: Medicare Other | Source: Ambulatory Visit | Attending: Internal Medicine | Admitting: Internal Medicine

## 2021-03-16 ENCOUNTER — Other Ambulatory Visit: Payer: Self-pay

## 2021-03-16 DIAGNOSIS — C3411 Malignant neoplasm of upper lobe, right bronchus or lung: Secondary | ICD-10-CM | POA: Diagnosis not present

## 2021-03-16 DIAGNOSIS — I110 Hypertensive heart disease with heart failure: Secondary | ICD-10-CM | POA: Insufficient documentation

## 2021-03-16 DIAGNOSIS — I509 Heart failure, unspecified: Secondary | ICD-10-CM | POA: Diagnosis not present

## 2021-03-16 DIAGNOSIS — Z79899 Other long term (current) drug therapy: Secondary | ICD-10-CM | POA: Diagnosis not present

## 2021-03-16 DIAGNOSIS — E119 Type 2 diabetes mellitus without complications: Secondary | ICD-10-CM | POA: Diagnosis not present

## 2021-03-16 DIAGNOSIS — E785 Hyperlipidemia, unspecified: Secondary | ICD-10-CM | POA: Diagnosis not present

## 2021-03-16 DIAGNOSIS — C3490 Malignant neoplasm of unspecified part of unspecified bronchus or lung: Secondary | ICD-10-CM

## 2021-03-16 DIAGNOSIS — Z902 Acquired absence of lung [part of]: Secondary | ICD-10-CM | POA: Diagnosis not present

## 2021-03-16 DIAGNOSIS — G4730 Sleep apnea, unspecified: Secondary | ICD-10-CM | POA: Diagnosis not present

## 2021-03-16 DIAGNOSIS — I2510 Atherosclerotic heart disease of native coronary artery without angina pectoris: Secondary | ICD-10-CM | POA: Insufficient documentation

## 2021-03-16 DIAGNOSIS — Z87891 Personal history of nicotine dependence: Secondary | ICD-10-CM | POA: Insufficient documentation

## 2021-03-16 DIAGNOSIS — Z85118 Personal history of other malignant neoplasm of bronchus and lung: Secondary | ICD-10-CM | POA: Diagnosis not present

## 2021-03-16 DIAGNOSIS — J439 Emphysema, unspecified: Secondary | ICD-10-CM | POA: Diagnosis not present

## 2021-03-16 DIAGNOSIS — I700 Atherosclerosis of aorta: Secondary | ICD-10-CM | POA: Diagnosis not present

## 2021-03-16 LAB — CBC WITH DIFFERENTIAL (CANCER CENTER ONLY)
Abs Immature Granulocytes: 0.03 10*3/uL (ref 0.00–0.07)
Basophils Absolute: 0 10*3/uL (ref 0.0–0.1)
Basophils Relative: 0 %
Eosinophils Absolute: 0.1 10*3/uL (ref 0.0–0.5)
Eosinophils Relative: 1 %
HCT: 47 % (ref 39.0–52.0)
Hemoglobin: 16.2 g/dL (ref 13.0–17.0)
Immature Granulocytes: 0 %
Lymphocytes Relative: 27 %
Lymphs Abs: 2 10*3/uL (ref 0.7–4.0)
MCH: 32.4 pg (ref 26.0–34.0)
MCHC: 34.5 g/dL (ref 30.0–36.0)
MCV: 94 fL (ref 80.0–100.0)
Monocytes Absolute: 0.8 10*3/uL (ref 0.1–1.0)
Monocytes Relative: 11 %
Neutro Abs: 4.4 10*3/uL (ref 1.7–7.7)
Neutrophils Relative %: 61 %
Platelet Count: 93 10*3/uL — ABNORMAL LOW (ref 150–400)
RBC: 5 MIL/uL (ref 4.22–5.81)
RDW: 14.2 % (ref 11.5–15.5)
WBC Count: 7.3 10*3/uL (ref 4.0–10.5)
nRBC: 0 % (ref 0.0–0.2)

## 2021-03-16 LAB — CMP (CANCER CENTER ONLY)
ALT: 30 U/L (ref 0–44)
AST: 22 U/L (ref 15–41)
Albumin: 4.1 g/dL (ref 3.5–5.0)
Alkaline Phosphatase: 63 U/L (ref 38–126)
Anion gap: 3 — ABNORMAL LOW (ref 5–15)
BUN: 12 mg/dL (ref 8–23)
CO2: 27 mmol/L (ref 22–32)
Calcium: 9.1 mg/dL (ref 8.9–10.3)
Chloride: 110 mmol/L (ref 98–111)
Creatinine: 0.82 mg/dL (ref 0.61–1.24)
GFR, Estimated: >60 mL/min (ref >60)
Glucose, Bld: 119 mg/dL — ABNORMAL HIGH (ref 70–99)
Potassium: 4.4 mmol/L (ref 3.5–5.1)
Sodium: 140 mmol/L (ref 135–145)
Total Bilirubin: 0.7 mg/dL (ref 0.3–1.2)
Total Protein: 7.6 g/dL (ref 6.5–8.1)

## 2021-03-16 MED ORDER — IOHEXOL 300 MG/ML  SOLN
75.0000 mL | Freq: Once | INTRAMUSCULAR | Status: AC | PRN
  Administered 2021-03-16: 75 mL via INTRAVENOUS

## 2021-03-16 MED ORDER — SODIUM CHLORIDE (PF) 0.9 % IJ SOLN
INTRAMUSCULAR | Status: AC
  Filled 2021-03-16: qty 50
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
## 2021-03-18 ENCOUNTER — Other Ambulatory Visit: Payer: Self-pay

## 2021-03-18 ENCOUNTER — Inpatient Hospital Stay (HOSPITAL_BASED_OUTPATIENT_CLINIC_OR_DEPARTMENT_OTHER): Payer: Medicare Other | Admitting: Physician Assistant

## 2021-03-18 ENCOUNTER — Encounter: Payer: Self-pay | Admitting: Physician Assistant

## 2021-03-18 VITALS — BP 129/67 | HR 80 | Temp 97.9°F | Resp 20 | Ht 75.0 in | Wt 186.8 lb

## 2021-03-18 DIAGNOSIS — I110 Hypertensive heart disease with heart failure: Secondary | ICD-10-CM | POA: Diagnosis not present

## 2021-03-18 DIAGNOSIS — I2510 Atherosclerotic heart disease of native coronary artery without angina pectoris: Secondary | ICD-10-CM | POA: Diagnosis not present

## 2021-03-18 DIAGNOSIS — Z85118 Personal history of other malignant neoplasm of bronchus and lung: Secondary | ICD-10-CM | POA: Diagnosis not present

## 2021-03-18 DIAGNOSIS — E119 Type 2 diabetes mellitus without complications: Secondary | ICD-10-CM | POA: Diagnosis not present

## 2021-03-18 DIAGNOSIS — Z87891 Personal history of nicotine dependence: Secondary | ICD-10-CM | POA: Diagnosis not present

## 2021-03-18 DIAGNOSIS — I509 Heart failure, unspecified: Secondary | ICD-10-CM | POA: Diagnosis not present

## 2021-03-18 DIAGNOSIS — C3491 Malignant neoplasm of unspecified part of right bronchus or lung: Secondary | ICD-10-CM

## 2021-03-18 DIAGNOSIS — G4730 Sleep apnea, unspecified: Secondary | ICD-10-CM | POA: Diagnosis not present

## 2021-03-18 DIAGNOSIS — Z902 Acquired absence of lung [part of]: Secondary | ICD-10-CM | POA: Diagnosis not present

## 2021-03-18 DIAGNOSIS — E785 Hyperlipidemia, unspecified: Secondary | ICD-10-CM | POA: Diagnosis not present

## 2021-03-18 DIAGNOSIS — Z79899 Other long term (current) drug therapy: Secondary | ICD-10-CM | POA: Diagnosis not present

## 2021-03-24 ENCOUNTER — Ambulatory Visit: Payer: Medicare Other | Admitting: Podiatry

## 2021-03-30 IMAGING — CT CT CHEST W/ CM
2 of 3 series · 15 of 36 positions shown, 18 images · IV contrast (OMNIPAQUE)
Comparison: CT angio chest 03/03/2019

CLINICAL DATA: Restaging non-small cell lung cancer.

EXAM:
CT CHEST WITH CONTRAST
TECHNIQUE: Multidetector CT imaging of the chest was performed during
intravenous contrast administration.
CONTRAST:  75mL OMNIPAQUE IOHEXOL 300 MG/ML  SOLN

[Series 2: axial st · axial · 0.72mm/px · z∈[-364,-74]mm · 12 of 171 slices shown, 15 images]
[im 13/171  mediastinal]
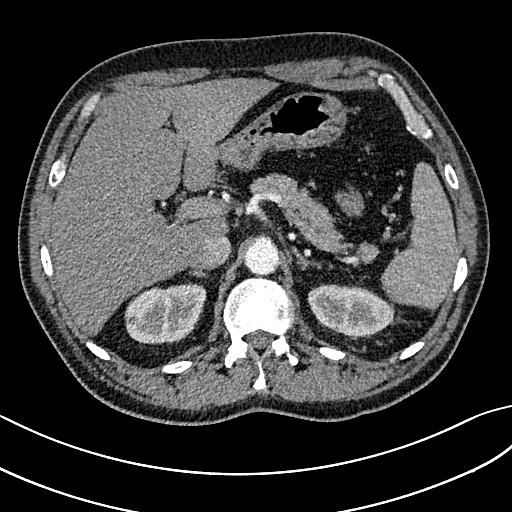
[im 13/171  lung]
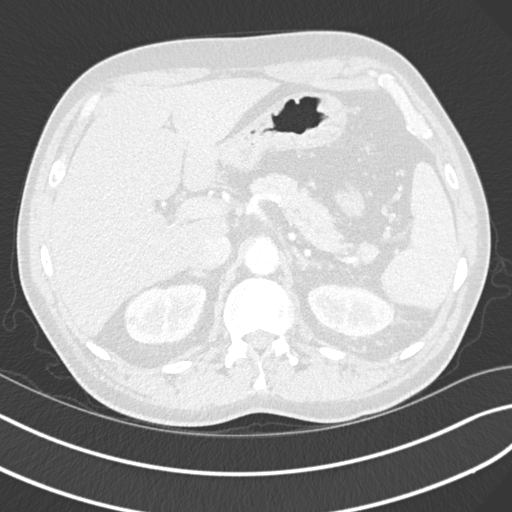
[im 26/171  lung]
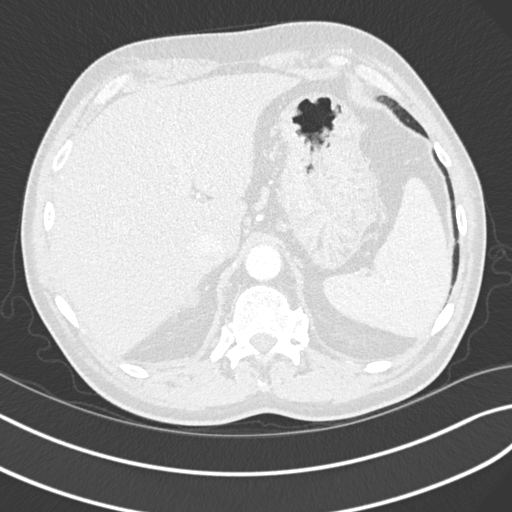
[im 38/171  lung]
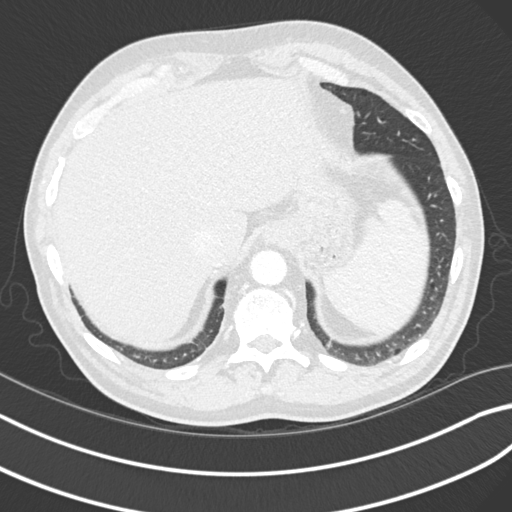
[im 51/171  lung]
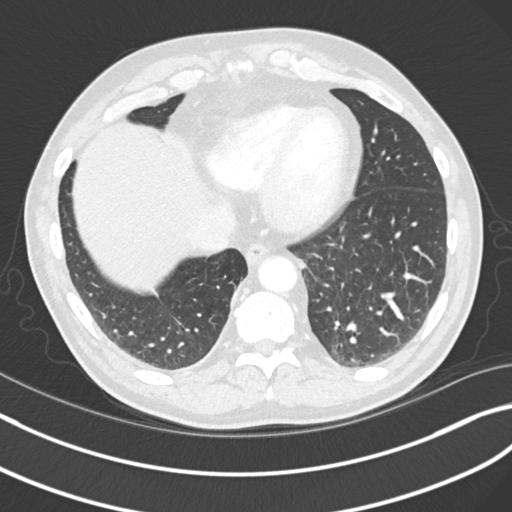
[im 63/171  mediastinal]
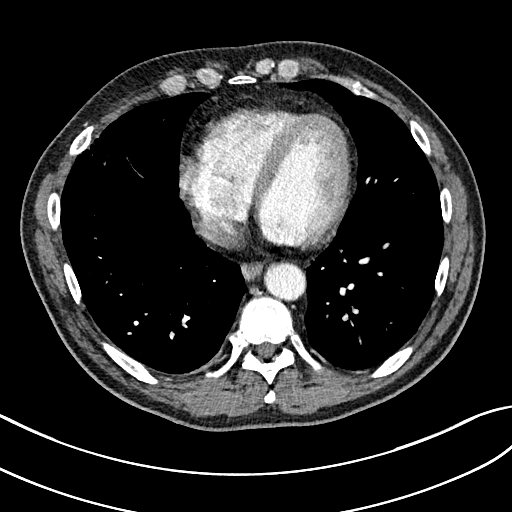
[im 63/171  lung]
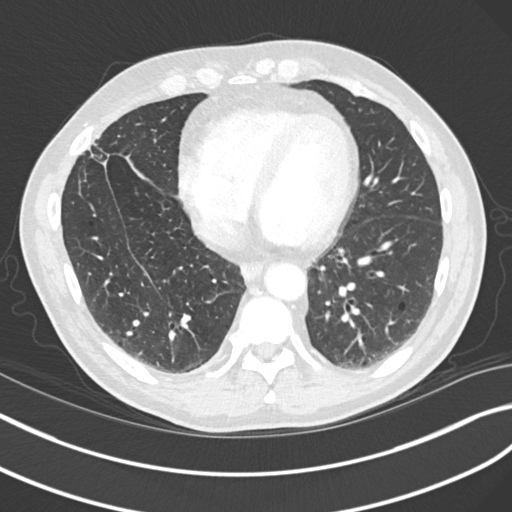
[im 76/171  lung]
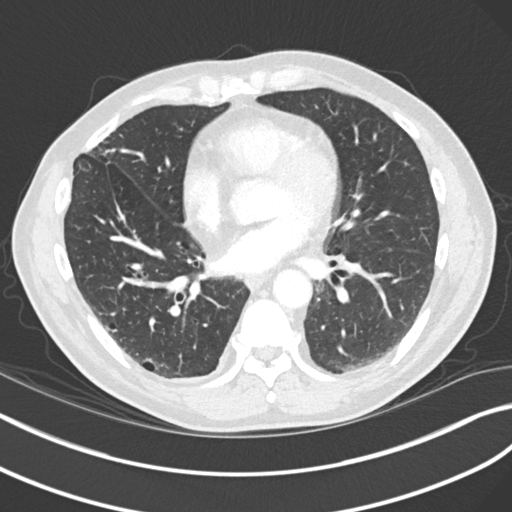
[im 95/171  lung]
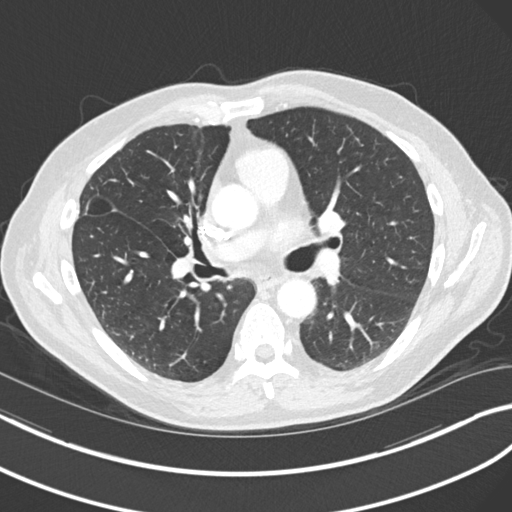
[im 108/171  lung]
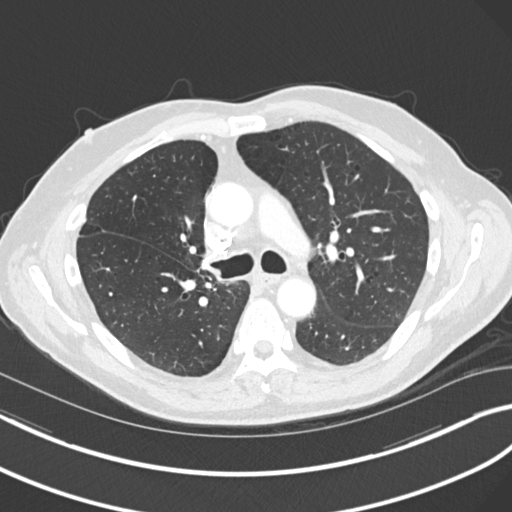
[im 120/171  mediastinal]
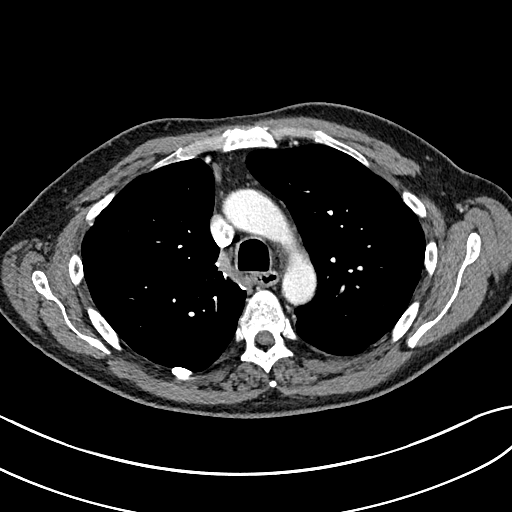
[im 120/171  lung]
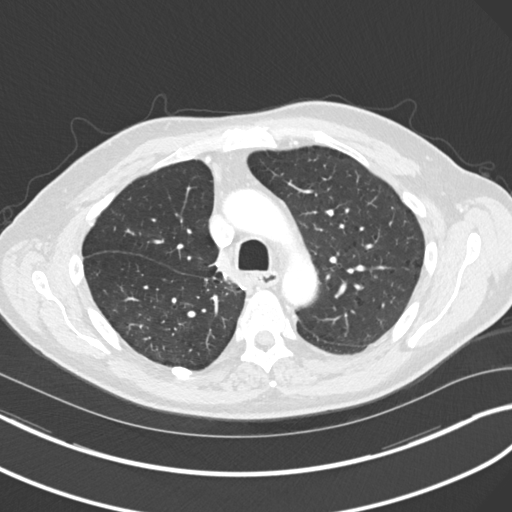
[im 133/171  lung]
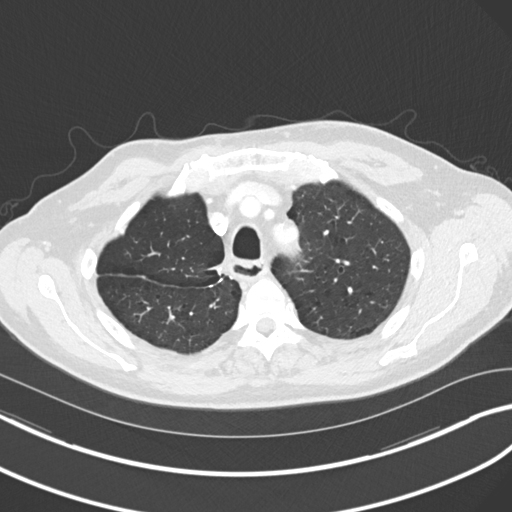
[im 145/171  lung]
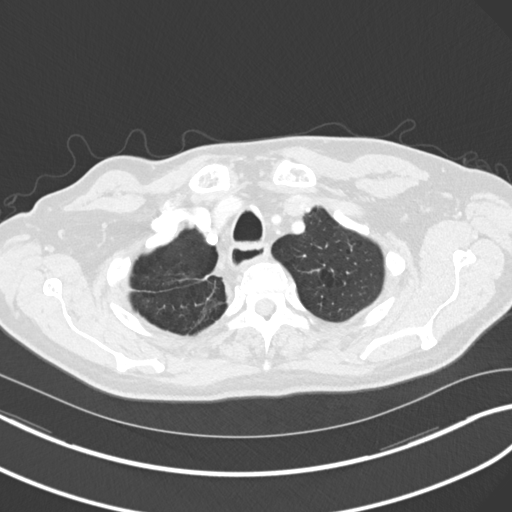
[im 158/171  lung]
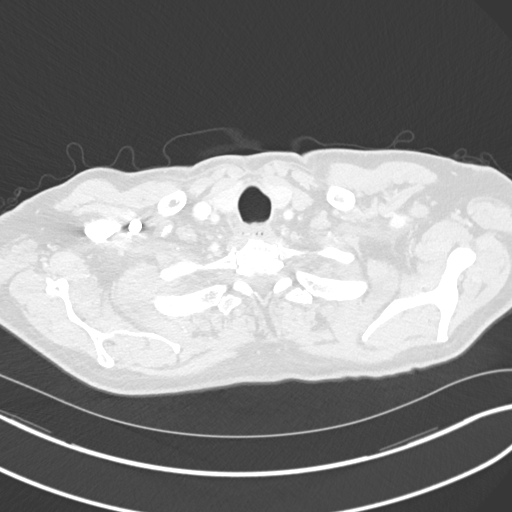

[Series 6: coronal · coronal · 0.70mm/px · 3 of 148 slices shown]
[im 30/148  lung]
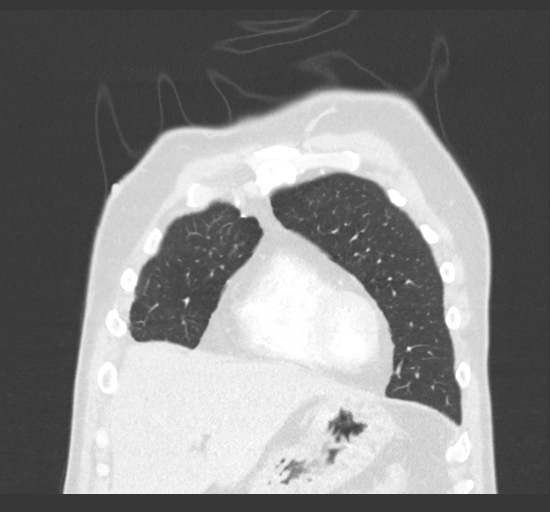
[im 59/148  lung]
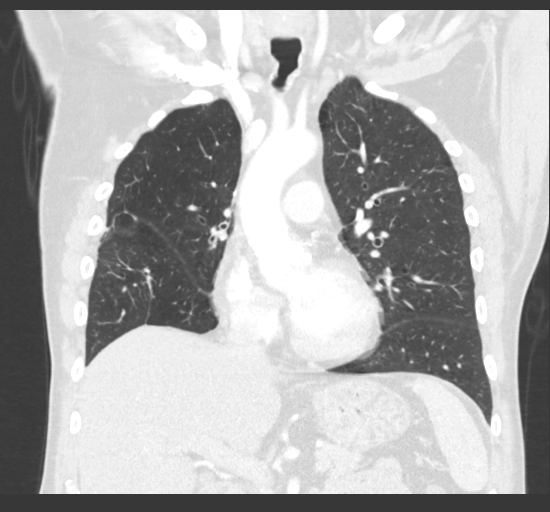
[im 89/148  lung]
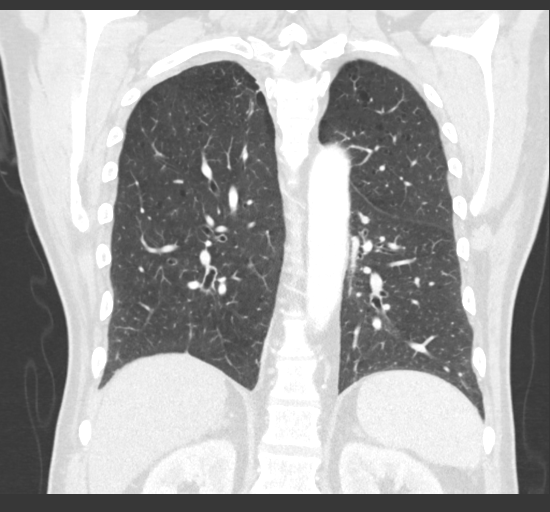

[15 of 36 positions shown; findings below may reference images not displayed]

FINDINGS: Cardiovascular: The heart size appears normal. No pericardial
effusion identified. The aortic atherosclerosis. Lad, RCA and left
circumflex coronary artery calcifications identified.

Mediastinum/Nodes: Normal appearance of the thyroid gland. The
trachea appears patent and is midline. Normal appearance of the
esophagus. No enlarged mediastinal or hilar lymph nodes.

Lungs/Pleura: Centrilobular and paraseptal emphysema with diffuse
bronchial wall thickening. Right upper lobectomy. No suspicious
pulmonary nodule or mass identified.

Upper Abdomen: No acute abnormality.

Musculoskeletal: No chest wall abnormality. No acute or significant
osseous findings.
IMPRESSION: 1. Status post right upper lobectomy. No findings to suggest
residual or recurrent tumor or metastatic disease.
2. Emphysema and aortic atherosclerosis. Three vessel coronary
artery calcifications noted.

Aortic Atherosclerosis (NE238-CNC.C) and Emphysema ([id number].H).

## 2021-04-01 ENCOUNTER — Other Ambulatory Visit: Payer: Self-pay

## 2021-04-01 ENCOUNTER — Encounter (HOSPITAL_COMMUNITY): Payer: Self-pay | Admitting: Physician Assistant

## 2021-04-01 ENCOUNTER — Telehealth (INDEPENDENT_AMBULATORY_CARE_PROVIDER_SITE_OTHER): Payer: Medicare Other | Admitting: Physician Assistant

## 2021-04-01 DIAGNOSIS — F411 Generalized anxiety disorder: Secondary | ICD-10-CM

## 2021-04-01 DIAGNOSIS — F418 Other specified anxiety disorders: Secondary | ICD-10-CM

## 2021-04-01 MED ORDER — BUSPIRONE HCL 15 MG PO TABS: 15.0000 mg | ORAL_TABLET | Freq: Three times a day (TID) | ORAL | 1 refills | Status: DC
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
## 2021-04-07 ENCOUNTER — Ambulatory Visit: Payer: Medicare Other | Admitting: Podiatry

## 2021-04-12 ENCOUNTER — Other Ambulatory Visit: Payer: Self-pay | Admitting: Internal Medicine

## 2021-04-12 DIAGNOSIS — Z7252 High risk homosexual behavior: Secondary | ICD-10-CM

## 2021-04-21 ENCOUNTER — Telehealth: Payer: Self-pay

## 2021-04-21 ENCOUNTER — Other Ambulatory Visit: Payer: Self-pay | Admitting: Internal Medicine

## 2021-04-21 DIAGNOSIS — J449 Chronic obstructive pulmonary disease, unspecified: Secondary | ICD-10-CM

## 2021-04-21 MED ORDER — ALBUTEROL SULFATE (2.5 MG/3ML) 0.083% IN NEBU: 2.5000 mg | INHALATION_SOLUTION | Freq: Four times a day (QID) | RESPIRATORY_TRACT | 2 refills | Status: AC | PRN

## 2021-04-21 NOTE — Telephone Encounter (Signed)
pts pharmacy called stating they have sent over a fax in regards to getting a rx refill for pts Albuterol neb solution. Pharmacy tech states pt is needing this medication refilled as soon as possible.

## 2021-04-22 ENCOUNTER — Telehealth: Payer: Self-pay

## 2021-04-22 NOTE — Telephone Encounter (Signed)
[facility] called regarding a refill that was not received by them for Albuterol solution and a disposable kit. The telephone number is [phone] for fax.

## 2021-04-23 NOTE — Telephone Encounter (Signed)
Rx was faxed today

## 2021-04-26 ENCOUNTER — Telehealth: Payer: Self-pay

## 2021-04-26 NOTE — Chronic Care Management (AMB) (Signed)
Chronic Care Management Pharmacy Assistant   Name: [name]  MRN: [id number] DOB: 1961/01/27  Reason for Encounter: Hypertension Disease State Call  Recent office visits:  No visits noted   Recent consult visits:  03/18/21- Cassandra Heilingoetter, PA-C (Oncology)- seen for evaluation and review of scan results, no medication changes, no follow up needed/ follow up with PCP  Hospital visits:  None in previous 6 months  Medications: Outpatient Encounter Medications as of 04/26/2021  Medication Sig   acetaminophen (TYLENOL) 500 MG tablet Take 1,000 mg by mouth 2 (two) times daily as needed.   albuterol (PROAIR HFA) 108 (90 Base) MCG/ACT inhaler Inhale 2 puffs into the lungs every 4 (four) hours as needed for wheezing.   albuterol (PROVENTIL) (2.5 MG/3ML) 0.083% nebulizer solution Take 3 mLs (2.5 mg total) by nebulization every 6 (six) hours as needed for wheezing or shortness of breath.   busPIRone (BUSPAR) 15 MG tablet Take 1 tablet (15 mg total) by mouth 3 (three) times daily.   carvedilol (COREG) 6.25 MG tablet Take 1 tablet (6.25 mg total) by mouth 2 (two) times daily with a meal.   cetirizine (ZYRTEC) 10 MG tablet Take 1 tablet (10 mg total) by mouth daily.   DESCOVY 200-25 MG tablet Take 1 tablet by mouth daily.   fluticasone (FLONASE) 50 MCG/ACT nasal spray Place 2 sprays into both nostrils daily.   hydrOXYzine (ATARAX/VISTARIL) 50 MG tablet Take 1 tablet (50 mg total) by mouth 3 (three) times daily as needed.   Misc Natural Products (NEURIVA PO) Take 1 tablet by mouth daily.   Multiple Vitamin (MULTIVITAMIN WITH MINERALS) TABS tablet Take 1 tablet by mouth daily.   pravastatin (PRAVACHOL) 40 MG tablet Take 1 tablet (40 mg total) by mouth daily.   sacubitril-valsartan (ENTRESTO) 24-26 MG Take 1 tablet by mouth 2 (two) times daily. Please keep upcoming appointment in October 2022 for future refills . Thank you   SALINE SPRAY NA Place into the nose daily as needed.    sertraline (ZOLOFT) 100 MG tablet Take 1 tablet (100 mg total) by mouth daily.   thiamine (VITAMIN B-1) 50 MG tablet Take 1 tablet (50 mg total) by mouth daily.   traZODone (DESYREL) 100 MG tablet Take 3 tablets (300 mg total) by mouth at bedtime.   Turmeric 500 MG CAPS Take 500 mg by mouth in the morning and at bedtime.   umeclidinium-vilanterol (ANORO ELLIPTA) 62.5-25 MCG/INH AEPB Inhale 1 puff into the lungs daily.   No facility-administered encounter medications on file as of 04/26/2021.   Reviewed chart prior to disease state call. Spoke with patient regarding BP  Recent Office Vitals: BP Readings from Last 3 Encounters:  03/18/21 129/67  01/11/21 120/66  08/04/20 (!) 145/79   Pulse Readings from Last 3 Encounters:  03/18/21 80  01/11/21 76  08/04/20 79    Wt Readings from Last 3 Encounters:  03/18/21 186 lb 12.8 oz (84.7 kg)  01/11/21 191 lb (86.6 kg)  07/20/20 189 lb (85.7 kg)     Kidney Function Lab Results  Component Value Date/Time   CREATININE 0.82 03/16/2021 08:59 AM   CREATININE 0.96 01/11/2021 01:43 PM   CREATININE 0.72 07/28/2020 03:43 PM   CREATININE 0.76 01/08/2020 09:52 AM   CREATININE 0.8 04/13/2016 08:03 AM   CREATININE 0.9 10/15/2015 12:38 PM   GFR 85.64 01/11/2021 01:43 PM   GFRNONAA >60 03/16/2021 08:59 AM   GFRAA >60 05/17/2020 03:29 PM   GFRAA >60 01/08/2020 09:52 AM  BMP Latest Ref Rng & Units 03/16/2021 01/11/2021 07/28/2020  Glucose 70 - 99 mg/dL 119(H) 102(H) 101(H)  BUN 8 - 23 mg/dL 12 18 <5(L)  Creatinine 0.61 - 1.24 mg/dL 0.82 0.96 0.72  Sodium 135 - 145 mmol/L 140 141 142  Potassium 3.5 - 5.1 mmol/L 4.4 4.0 3.8  Chloride 98 - 111 mmol/L 110 107 106  CO2 22 - 32 mmol/L 27 28 24   Calcium 8.9 - 10.3 mg/dL 9.1 9.3 8.9   Several unsuccessful attempts made to contact patient  Current antihypertensive regimen:  Carvedilol 6.25mg  twice daily Entresto 24-26mg  - 1 tablet twice daily  How often are you checking your Blood Pressure?    Current home BP readings:   What recent interventions/DTPs have been made by any provider to improve Blood Pressure control since last CPP Visit:   Any recent hospitalizations or ED visits since last visit with CPP?   What diet changes have been made to improve Blood Pressure Control?   What exercise is being done to improve your Blood Pressure Control?    Adherence Review: Is the patient currently on ACE/ARB medication?  Does the patient have >5 day gap between last estimated fill dates?  Carvedilol 6.25mg  -90 DS last filled 02/18/21  Star Rating Drugs: Pravastatin 40 mg- 90 DS last filled 02/18/21 Sacubitril- Valsartan 24-26 mg- 30 DS last filled 04/12/21   Wilford Sports CPA, CMA

## 2021-04-28 ENCOUNTER — Telehealth (HOSPITAL_COMMUNITY): Payer: Self-pay | Admitting: *Deleted

## 2021-04-28 NOTE — Telephone Encounter (Signed)
Rx Refill Request busPIRone (BUSPAR) 15 MG tablet

## 2021-04-29 ENCOUNTER — Other Ambulatory Visit (HOSPITAL_COMMUNITY): Payer: Self-pay | Admitting: Physician Assistant

## 2021-04-29 DIAGNOSIS — F411 Generalized anxiety disorder: Secondary | ICD-10-CM

## 2021-04-29 MED ORDER — BUSPIRONE HCL 15 MG PO TABS: 15.0000 mg | ORAL_TABLET | Freq: Three times a day (TID) | ORAL | 1 refills | Status: DC

## 2021-04-29 NOTE — Progress Notes (Signed)
Provider was contacted by Direce E McIntyre, RMA regarding medication refill. Patient's medication to be e-prescribed to pharmacy of choice.

## 2021-04-29 NOTE — Telephone Encounter (Signed)
Provider was contacted by Direce E McIntyre, RMA regarding medication refill. Patient's medication to be e-prescribed to pharmacy of choice.

## 2021-05-04 DIAGNOSIS — J449 Chronic obstructive pulmonary disease, unspecified: Secondary | ICD-10-CM | POA: Diagnosis not present

## 2021-05-04 DIAGNOSIS — J45909 Unspecified asthma, uncomplicated: Secondary | ICD-10-CM | POA: Diagnosis not present

## 2021-05-10 ENCOUNTER — Ambulatory Visit: Payer: Medicare Other | Admitting: Internal Medicine

## 2021-05-10 ENCOUNTER — Other Ambulatory Visit: Payer: Self-pay | Admitting: Cardiology

## 2021-05-11 ENCOUNTER — Telehealth: Payer: Self-pay | Admitting: Internal Medicine

## 2021-05-11 ENCOUNTER — Other Ambulatory Visit: Payer: Self-pay | Admitting: Internal Medicine

## 2021-05-11 DIAGNOSIS — F418 Other specified anxiety disorders: Secondary | ICD-10-CM

## 2021-05-11 DIAGNOSIS — G4700 Insomnia, unspecified: Secondary | ICD-10-CM

## 2021-05-11 MED ORDER — TRAZODONE HCL 100 MG PO TABS: 300.0000 mg | ORAL_TABLET | Freq: Every day | ORAL | 0 refills | Status: DC

## 2021-05-13 ENCOUNTER — Telehealth: Payer: Self-pay

## 2021-05-13 NOTE — Progress Notes (Signed)
Opened in Error.

## 2021-05-13 NOTE — Progress Notes (Signed)
    Chronic Care Management Pharmacy Assistant   Name: [name]  MRN: [id number] DOB: 1961/07/27   Reason for Encounter: Disease State Hypertension     Recent office visits:  None noted.   Recent consult visits:  03/18/21 Cassandra Heilingoetter PA - Non-small cell carcinoma of right lung, stage 1 - No follow ups noted 04/01/21 Ileene Musa PA - Anxiety/ Depression follow up - Adjusting his buspirone dose from 15 mg 2 times daily to 15 mg 3 times daily for the management of his anxiety. - Follow up in 2 months.   Hospital visits:  None in previous 6 months  Medications: Outpatient Encounter Medications as of 05/13/2021  Medication Sig   acetaminophen (TYLENOL) 500 MG tablet Take 1,000 mg by mouth 2 (two) times daily as needed.   albuterol (PROAIR HFA) 108 (90 Base) MCG/ACT inhaler Inhale 2 puffs into the lungs every 4 (four) hours as needed for wheezing.   albuterol (PROVENTIL) (2.5 MG/3ML) 0.083% nebulizer solution Take 3 mLs (2.5 mg total) by nebulization every 6 (six) hours as needed for wheezing or shortness of breath.   busPIRone (BUSPAR) 15 MG tablet Take 1 tablet (15 mg total) by mouth 3 (three) times daily.   carvedilol (COREG) 6.25 MG tablet Take 1 tablet (6.25 mg total) by mouth 2 (two) times daily with a meal.   cetirizine (ZYRTEC) 10 MG tablet Take 1 tablet (10 mg total) by mouth daily.   DESCOVY 200-25 MG tablet Take 1 tablet by mouth daily.   ENTRESTO 24-26 MG Take 1 tablet by mouth 2 (two) times daily. Please keep upcoming appointment in October 2022 for future refills. Thank you   fluticasone (FLONASE) 50 MCG/ACT nasal spray Place 2 sprays into both nostrils daily.   hydrOXYzine (ATARAX/VISTARIL) 50 MG tablet Take 1 tablet (50 mg total) by mouth 3 (three) times daily as needed.   Misc Natural Products (NEURIVA PO) Take 1 tablet by mouth daily.   Multiple Vitamin (MULTIVITAMIN WITH MINERALS) TABS tablet Take 1 tablet by mouth daily.   pravastatin (PRAVACHOL) 40 MG  tablet Take 1 tablet (40 mg total) by mouth daily.   SALINE SPRAY NA Place into the nose daily as needed.   sertraline (ZOLOFT) 100 MG tablet Take 1 tablet (100 mg total) by mouth daily.   thiamine (VITAMIN B-1) 50 MG tablet Take 1 tablet (50 mg total) by mouth daily.   traZODone (DESYREL) 100 MG tablet Take 3 tablets (300 mg total) by mouth at bedtime.   Turmeric 500 MG CAPS Take 500 mg by mouth in the morning and at bedtime.   umeclidinium-vilanterol (ANORO ELLIPTA) 62.5-25 MCG/INH AEPB Inhale 1 puff into the lungs daily.   No facility-administered encounter medications on file as of 05/13/2021.   Have you had any problems recently with your health? Patient states that he is doing well. No current concerns.   Have you had any problems with your pharmacy? N/a  What issues or side effects are you having with your medications? N/a  What would you like me to pass along to [facility] for them to help you with?  N/a  What can we do to take care of you better? N/a   Misc. Comments: Patient very pleasant man states that he is doing well. His appetite is getting better, Per patient he is staying active, and that his sleeping pattern was a little off but no concerns to report.   Star Rating Drugs: Pravastatin 40 mg  Andee Poles, CMA

## 2021-05-17 ENCOUNTER — Other Ambulatory Visit: Payer: Self-pay | Admitting: Internal Medicine

## 2021-05-17 DIAGNOSIS — I10 Essential (primary) hypertension: Secondary | ICD-10-CM

## 2021-05-17 DIAGNOSIS — I739 Peripheral vascular disease, unspecified: Secondary | ICD-10-CM

## 2021-05-17 DIAGNOSIS — E785 Hyperlipidemia, unspecified: Secondary | ICD-10-CM

## 2021-06-02 ENCOUNTER — Encounter: Payer: Self-pay | Admitting: Internal Medicine

## 2021-06-03 ENCOUNTER — Other Ambulatory Visit: Payer: Self-pay

## 2021-06-03 ENCOUNTER — Telehealth (INDEPENDENT_AMBULATORY_CARE_PROVIDER_SITE_OTHER): Payer: Medicare Other | Admitting: Physician Assistant

## 2021-06-03 DIAGNOSIS — F411 Generalized anxiety disorder: Secondary | ICD-10-CM

## 2021-06-03 DIAGNOSIS — F418 Other specified anxiety disorders: Secondary | ICD-10-CM | POA: Diagnosis not present

## 2021-06-03 MED ORDER — BUSPIRONE HCL 30 MG PO TABS: 30.0000 mg | ORAL_TABLET | Freq: Two times a day (BID) | ORAL | 2 refills | Status: DC

## 2021-06-03 MED ORDER — SERTRALINE HCL 50 MG PO TABS
150.0000 mg | ORAL_TABLET | Freq: Every day | ORAL | 1 refills | Status: DC
Start: 2021-06-03 — End: 2021-07-07
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
## 2021-06-04 ENCOUNTER — Other Ambulatory Visit (HOSPITAL_COMMUNITY): Payer: Self-pay | Admitting: Physician Assistant

## 2021-06-04 DIAGNOSIS — F411 Generalized anxiety disorder: Secondary | ICD-10-CM

## 2021-06-06 ENCOUNTER — Encounter (HOSPITAL_COMMUNITY): Payer: Self-pay | Admitting: Physician Assistant

## 2021-06-08 DIAGNOSIS — H40013 Open angle with borderline findings, low risk, bilateral: Secondary | ICD-10-CM | POA: Diagnosis not present

## 2021-06-08 DIAGNOSIS — H25013 Cortical age-related cataract, bilateral: Secondary | ICD-10-CM | POA: Diagnosis not present

## 2021-06-08 DIAGNOSIS — H2513 Age-related nuclear cataract, bilateral: Secondary | ICD-10-CM | POA: Diagnosis not present

## 2021-06-08 DIAGNOSIS — E119 Type 2 diabetes mellitus without complications: Secondary | ICD-10-CM | POA: Diagnosis not present

## 2021-06-08 DIAGNOSIS — H524 Presbyopia: Secondary | ICD-10-CM | POA: Diagnosis not present

## 2021-06-08 DIAGNOSIS — H52221 Regular astigmatism, right eye: Secondary | ICD-10-CM | POA: Diagnosis not present

## 2021-06-08 DIAGNOSIS — H5203 Hypermetropia, bilateral: Secondary | ICD-10-CM | POA: Diagnosis not present

## 2021-06-25 ENCOUNTER — Other Ambulatory Visit: Payer: Self-pay | Admitting: Internal Medicine

## 2021-06-25 DIAGNOSIS — Z7252 High risk homosexual behavior: Secondary | ICD-10-CM

## 2021-06-30 ENCOUNTER — Other Ambulatory Visit (HOSPITAL_COMMUNITY): Payer: Self-pay | Admitting: Physician Assistant

## 2021-06-30 DIAGNOSIS — F411 Generalized anxiety disorder: Secondary | ICD-10-CM

## 2021-07-05 ENCOUNTER — Other Ambulatory Visit (HOSPITAL_COMMUNITY): Payer: Self-pay | Admitting: Physician Assistant

## 2021-07-05 DIAGNOSIS — F418 Other specified anxiety disorders: Secondary | ICD-10-CM

## 2021-07-07 ENCOUNTER — Other Ambulatory Visit: Payer: Self-pay

## 2021-07-07 ENCOUNTER — Encounter: Payer: Self-pay | Admitting: Cardiology

## 2021-07-07 ENCOUNTER — Ambulatory Visit (INDEPENDENT_AMBULATORY_CARE_PROVIDER_SITE_OTHER): Payer: Medicare Other | Admitting: Cardiology

## 2021-07-07 DIAGNOSIS — I739 Peripheral vascular disease, unspecified: Secondary | ICD-10-CM

## 2021-07-07 DIAGNOSIS — I428 Other cardiomyopathies: Secondary | ICD-10-CM

## 2021-07-07 DIAGNOSIS — E785 Hyperlipidemia, unspecified: Secondary | ICD-10-CM

## 2021-07-07 NOTE — Assessment & Plan Note (Signed)
Seems to be able to tolerate the Entresto 24/26 twice daily.  His blood pressure is 112/80.  We will continue with this and we will continue with the carvedilol 6.25 mg twice a day.  Overall maintaining his weight very well.  No further changes in medications.  At some point, we may need to try low-dose spironolactone 12.5 mg a day or perhaps [place] as well.

## 2021-07-07 NOTE — Assessment & Plan Note (Signed)
Prior iliac stent Dr. Gwenlyn Found.
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
## 2021-07-07 NOTE — Assessment & Plan Note (Signed)
Pravachol  40 mg.  Last LDL 71.

## 2021-07-07 NOTE — Patient Instructions (Signed)
Medication Instructions:  The current medical regimen is effective;  continue present plan and medications.  *If you need a refill on your cardiac medications before your next appointment, please call your pharmacy*  Follow-Up: At [facility], you and your health needs are our priority.  As part of our continuing mission to provide you with exceptional heart care, we have created designated Provider Care Teams.  These Care Teams include your primary Cardiologist (physician) and Advanced Practice Providers (APPs -  Physician Assistants and Nurse Practitioners) who all work together to provide you with the care you need, when you need it.  We recommend signing up for the patient portal called "MyChart".  Sign up information is provided on this After Visit Summary.  MyChart is used to connect with patients for Virtual Visits (Telemedicine).  Patients are able to view lab/test results, encounter notes, upcoming appointments, etc.  Non-urgent messages can be sent to your provider as well.   To learn more about what you can do with MyChart, go to [email or web address].    Your next appointment:   1 year(s)  The format for your next appointment:   In Person  Provider:   Mark Skains, MD   Thank you for choosing [facility]!!    

## 2021-07-08 ENCOUNTER — Telehealth: Payer: Medicare Other

## 2021-07-08 ENCOUNTER — Ambulatory Visit: Payer: Medicare Other | Admitting: Internal Medicine

## 2021-07-08 NOTE — Progress Notes (Deleted)
Chronic Care Management Pharmacy Note  07/08/2021 Name:  [name] MRN:  [id number] DOB:  [birth year]  Summary: - Patient reports that he feels he is doing well, has no issues or concerns with current medications - Has not been checking blood pressures, will be out of entresto after this month, will need refills to last until next cardiology appointment 06/2021 - Does not currently have albuterol rescue inhaler, needs refilled - using nebulizer solution if needed at this time   Recommendations/Changes made from today's visit: Start: Saline Nasal Spray - as needed  Restart: Albuterol 108 mcg/act inhaler - use 2 puffs every 6 hours as needed for shortness of breath Stop: Diphenhydramine   Subjective: [name] is an 60 y.o. year old male who is a primary patient of [name], MD.  The CCM team was consulted for assistance with disease management and care coordination needs.    Engaged with patient by telephone for initial visit in response to provider referral for pharmacy case management and/or care coordination services.   Consent to Services:  The patient was given the following information about Chronic Care Management services today, agreed to services, and gave verbal consent: 1. CCM service includes personalized support from designated clinical staff supervised by the primary care provider, including individualized plan of care and coordination with other care providers 2. 24/7 contact phone numbers for assistance for urgent and routine care needs. 3. Service will only be billed when office clinical staff spend 20 minutes or more in a month to coordinate care. 4. Only one practitioner may furnish and bill the service in a calendar month. 5.The patient may stop CCM services at any time (effective at the end of the month) by phone call to the office staff. 6. The patient will be responsible for cost sharing (co-pay) of up to 20% of the service fee (after annual deductible  is met). Patient agreed to services and consent obtained.  Patient Care Team: [name], MD as PCP - General (Internal Medicine) Jerline Pain, MD as PCP - Cardiology (Cardiology) Warden Fillers, MD as Consulting Physician (Ophthalmology) Delice Bison Darnelle Maffucci, [facility] as Pharmacist (Pharmacist)  Recent office visits: 01/11/2021-  PCP visit - nicotine, meloxicam, and vraylar discontinued   Recent consult visits: 07/07/2021 - Dr. Marlou Porch - Cardiology - patient tolerating entresto 24/26 BID well - maintaining appropriate weight - no changes at appointment   03/18/2021 Cassandra Heilingoetter PA-C  - Oncology - non-small cell right upper lobe lung cancer adenocarcinoma - CT scan shows no evidence of disease progression - as he is 5 years after initial diagnosis - opted to follow with PCP q3 months  - counseled on smoking cessation - no changes to medications   Hospital visits: None in previous 6 months  Objective:  Lab Results  Component Value Date   CREATININE 0.82 03/16/2021   BUN 12 03/16/2021   GFR 85.64 01/11/2021   GFRNONAA >60 03/16/2021   GFRAA >60 05/17/2020   NA 140 03/16/2021   K 4.4 03/16/2021   CALCIUM 9.1 03/16/2021   CO2 27 03/16/2021   GLUCOSE 119 (H) 03/16/2021    Lab Results  Component Value Date/Time   HGBA1C 5.8 (A) 07/20/2020 10:12 AM   HGBA1C 5.9 (A) 03/03/2020 08:54 AM   HGBA1C 6.0 12/04/2019 08:56 AM   HGBA1C 8.1 (H) 06/26/2019 08:36 AM   FRUCTOSAMINE 180 06/23/2009 08:53 PM   GFR 85.64 01/11/2021 01:43 PM   GFR 96.95 07/20/2020 10:44 AM  MICROALBUR 11.8 (H) 12/04/2019 08:56 AM   MICROALBUR 0.2 10/08/2008 09:56 AM    Last diabetic Eye exam:  Lab Results  Component Value Date/Time   HMDIABEYEEXA No Retinopathy 01/08/2020 12:00 AM    Last diabetic Foot exam:  Lab Results  Component Value Date/Time   HMDIABFOOTEX normal 06/04/2012 12:00 AM     Lab Results  Component Value Date   CHOL 123 07/20/2020   HDL 35.20 (L) 07/20/2020   LDLCALC 71  07/20/2020   LDLDIRECT 122.0 11/30/2016   TRIG 86.0 07/20/2020   CHOLHDL 3 07/20/2020    Hepatic Function Latest Ref Rng & Units 03/16/2021 07/28/2020 07/20/2020  Total Protein 6.5 - 8.1 g/dL 7.6 7.2 6.5  Albumin 3.5 - 5.0 g/dL 4.1 3.8 4.1  AST 15 - 41 U/L _0 ALT 0 - 44 U/L _1 Alk Phosphatase 38 - 126 U/L 63 67 58  Total Bilirubin 0.3 - 1.2 mg/dL 0.7 0.6 0.7  Bilirubin, Direct 0.0 - 0.3 mg/dL - - 0.2    Lab Results  Component Value Date/Time   TSH 0.90 06/26/2019 08:36 AM   TSH 0.56 11/30/2016 11:37 AM    CBC Latest Ref Rng & Units 03/16/2021 01/11/2021 07/28/2020  WBC 4.0 - 10.5 K/uL 7.3 6.8 7.9  Hemoglobin 13.0 - 17.0 g/dL 16.2 16.4 17.5(H)  Hematocrit 39.0 - 52.0 % 47.0 48.2 50.7  Platelets 150 - 400 K/uL 93(L) 94.0(L) 190    No results found for: VD25OH  Clinical ASCVD: No  The ASCVD Risk score (Arnett DK, et al., 2019) failed to calculate for the following reasons:   The valid total cholesterol range is 130 to 320 mg/dL    Depression screen [facility] 2/9 03/11/2021 01/11/2021 01/11/2021  Decreased Interest _2 Down, Depressed, Hopeless _3 PHQ - 2 Score _4 Altered sleeping 0 0 -  Tired, decreased energy 0 0 -  Change in appetite 1 0 -  Feeling bad or failure about yourself  3 1 -  Trouble concentrating 0 0 -  Moving slowly or fidgety/restless 0 0 -  Suicidal thoughts 1 0 -  PHQ-9 Score 11 3 -  Some encounter information is confidential and restricted. Go to Review Flowsheets activity to see all data.  Some recent data might be hidden    Social History   Tobacco Use  Smoking Status Every Day   Packs/day: 1.00   Years: 38.00   Pack years: 38.00   Types: Cigarettes  Smokeless Tobacco Never  Tobacco Comments   one pack cigarettes daily   BP Readings from Last 3 Encounters:  07/07/21 112/80  03/18/21 129/67  01/11/21 120/66   Pulse Readings from Last 3 Encounters:  07/07/21 82  03/18/21 80  01/11/21 76   Wt Readings from Last 3  Encounters:  07/07/21 190 lb (86.2 kg)  03/18/21 186 lb 12.8 oz (84.7 kg)  01/11/21 191 lb (86.6 kg)   BMI Readings from Last 3 Encounters:  07/07/21 23.75 kg/m  03/18/21 23.35 kg/m  01/11/21 23.87 kg/m    Assessment/Interventions: Review of patient past medical history, allergies, medications, health status, including review of consultants reports, laboratory and other test data, was performed as part of comprehensive evaluation and provision of chronic care management services.   SDOH:  (Social Determinants of Health) assessments and interventions performed: Yes   SDOH Screenings   Alcohol Screen: Low Risk    Last Alcohol Screening Score (AUDIT): 0  Depression (PHQ2-9): Medium Risk   PHQ-2 Score: 22  Financial Resource Strain: Low Risk    Difficulty of Paying Living Expenses: Not hard at all  Food Insecurity: No Food Insecurity   Worried About Charity fundraiser in the Last Year: Never true   Ran Out of Food in the Last Year: Never true  Housing: Low Risk    Last Housing Risk Score: 0  Physical Activity: Sufficiently Active   Days of Exercise per Week: 5 days   Minutes of Exercise per Session: 30 min  Social Connections: Moderately Isolated   Frequency of Communication with Friends and Family: More than three times a week   Frequency of Social Gatherings with Friends and Family: Once a week   Attends Religious Services: Never   Marine scientist or Organizations: Yes   Attends Archivist Meetings: Never   Marital Status: Never married  Stress: Stress Concern Present   Feeling of Stress : To some extent  Tobacco Use: High Risk   Smoking Tobacco Use: Every Day   Smokeless Tobacco Use: Never  Transportation Needs: No Transportation Needs   Lack of Transportation (Medical): No   Lack of Transportation (Non-Medical): No    CCM Care Plan  Allergies  Allergen Reactions   Ace Inhibitors Cough   Aspirin Other (See Comments)    Reaction:  Nose bleeds  and GI bleeding    Clopidogrel Bisulfate Other (See Comments)    Reaction:  Nose bleeds    Crestor [Rosuvastatin Calcium] Other (See Comments)    Reaction:  Leg cramps    Metformin And Related Diarrhea   Rosuvastatin Cough    Medications Reviewed Today     Reviewed by Maren Beach, Adair Laundry, CMA (Certified Medical Assistant) on 07/07/21 at 14  Med List Status: <None>   Medication Order Taking? Sig Documenting Provider Last Dose Status Informant  acetaminophen (TYLENOL) 500 MG tablet [id number] Yes Take 1,000 mg by mouth 2 (two) times daily as needed. [provider] Taking Active   albuterol (PROAIR HFA) 108 (90 Base) MCG/ACT inhaler [id number] Yes Inhale 2 puffs into the lungs every 4 (four) hours as needed for wheezing. [name], MD Taking Active   albuterol (PROVENTIL) (2.5 MG/3ML) 0.083% nebulizer solution [id number] Yes Take 3 mLs (2.5 mg total) by nebulization every 6 (six) hours as needed for wheezing or shortness of breath. [name], MD Taking Active   busPIRone (BUSPAR) 30 MG tablet [id number] Yes Take 1 tablet (30 mg total) by mouth 2 (two) times daily. Malachy Mood, PA Taking Active   carvedilol (COREG) 6.25 MG tablet [id number] Yes Take 1 tablet (6.25 mg total) by mouth 2 (two) times daily with a meal. [name], MD Taking Active   cetirizine (ZYRTEC) 10 MG tablet [id number] Yes Take 1 tablet (10 mg total) by mouth daily. [name], MD Taking Active   DESCOVY 200-25 MG tablet [id number] Yes Take 1 tablet by mouth daily. [name], MD Taking Active   ENTRESTO 24-26 MG [id number] Yes Take 1 tablet by mouth 2 (two) times daily. Please keep upcoming appointment in October 2022 for future refills. Thank you Jerline Pain, MD Taking Active   fluticasone [facility]) 50 MCG/ACT nasal spray [id number] Yes Place 2 sprays into both nostrils daily. [name], MD Taking Active   hydrOXYzine (ATARAX/VISTARIL) 50 MG tablet [id number] Yes Take 1 tablet  (50 mg total) by mouth 3 (three) times daily as needed. [place],  Terese Door, PA Taking Active   Misc Natural Products (NEURIVA PO) [id number] Yes Take 1 tablet by mouth daily. [provider] Taking Active   Multiple Vitamin (MULTIVITAMIN WITH MINERALS) TABS tablet [id number] Yes Take 1 tablet by mouth daily. Connye Burkitt, NP Taking Active   pravastatin (PRAVACHOL) 40 MG tablet [id number] Yes Take 1 tablet (40 mg total) by mouth daily. [name], MD Taking Active   SALINE SPRAY [place] [id number] Yes Place into the nose daily as needed. [provider] Taking Active   sertraline (ZOLOFT) 50 MG tablet [id number] Yes Take 3 tablets (150 mg total) by mouth daily. Malachy Mood, PA Taking Active   thiamine (VITAMIN B-1) 50 MG tablet [id number] Yes Take 1 tablet (50 mg total) by mouth daily. [name], MD Taking Active   traZODone (DESYREL) 100 MG tablet [id number] Yes Take 3 tablets (300 mg total) by mouth at bedtime. [name], MD Taking Active   Turmeric 500 MG CAPS [id number] Yes Take 500 mg by mouth in the morning and at bedtime. [provider] Taking Active   umeclidinium-vilanterol (ANORO ELLIPTA) 62.5-25 MCG/INH AEPB [id number] Yes Inhale 1 puff into the lungs daily. [name], MD Taking Active             Patient Active Problem List   Diagnosis Date Noted   Long toenail 01/11/2021   Aortic atherosclerosis (HCC)    Generalized anxiety disorder 11/18/2020   Moderate episode of recurrent major depressive disorder ([place]) 11/18/2020   Panic disorder 11/18/2020   Schizophrenia ([place]) 05/18/2020   Thiamine deficiency 12/10/2019   Screening-pulmonary TB 12/04/2019   Irritable bowel syndrome with diarrhea 07/01/2019   Type II diabetes mellitus with manifestations ([place]) 06/26/2019   De Quervain's tenosynovitis, left 04/11/2018   Sleep apnea, primary central 11/30/2016   Insomnia w/ sleep apnea 11/30/2016   Syphili, latent 03/05/2016   Glaucoma suspect  of both eyes 11/19/2015   Non-small cell carcinoma of lung, stage 1 ([place]) 10/12/2015   COPD GOLD II if use fev1/VC and still smoking  08/11/2015   High risk homosexual behavior 07/09/2014   PUD (peptic ulcer disease) 01/09/2013   Routine general medical examination at a health care facility 06/04/2012   Paranoid schizophrenia ([place]) 08/01/2011   DJD (degenerative joint disease) of knee 03/15/2011   Obstructive sleep apnea 11/29/2010   ERECTILE DYSFUNCTION, 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/2010   Allergic rhinitis 03/17/2010   Smoking 12/14/2009   HYPERTENSION, BENIGN 10/05/2009   Nonischemic cardiomyopathy ([place]) 10/05/2009   PAD (peripheral artery disease) ([place]) 09/15/2009   Hx of adenomatous colonic polyps 12/03/2008   Hyperlipidemia with target LDL less than 130 06/23/2008   BPH associated with nocturia 06/23/2008    Immunization History  Administered Date(s) Administered   Hep A / Hep B 07/14/2009   Hepatitis A 07/21/2008   Influenza Inj Mdck Quad Pf 06/14/2019   Influenza Split 09/29/2011, 06/04/2012   Influenza Whole 06/12/2008, 06/23/2009, 08/05/2010   Influenza,inj,Quad PF,6+ Mos 06/06/2013, 07/08/2014, 06/01/2016, 10/03/2017, 06/05/2018, 07/20/2020   Influenza-Unspecified 07/28/2015   PFIZER(Purple Top)SARS-COV-2 Vaccination 12/05/2019, 01/01/2020, 06/02/2020   Pneumococcal Conjugate-13 06/06/2013   Pneumococcal Polysaccharide-23 12/06/2007, 10/12/2015, 01/11/2021   Td 04/09/2003   Tdap 06/06/2013, 07/11/2015   Zoster Recombinat (Shingrix) 04/05/2018, 06/06/2018    Conditions to be addressed/monitored:  Hypertension, Hyperlipidemia, Heart Failure, COPD, Allergic Rhinitis, and HIV prevention   There are no care plans that you recently modified to display for this patient.     Medication Assistance: None required.  Patient affirms current coverage meets needs.  Compliance/Adherence/Medication fill history: Care Gaps: Colonoscopy Foot Exam, A1c, yearly Eye exam   Patient's  preferred pharmacy is:  [place], [place] [address] [place] [address] Phone: [phone] Fax: [phone]   Uses pill box? No - feels that he can manage without  Pt endorses 100% compliance  Care Plan and Follow Up Patient Decision:  Patient agrees to Care Plan and Follow-up.  Plan: Telephone follow up appointment with care management team member scheduled for:  2 months  and The patient has been provided with contact information for the care management team and has been advised to call with any health related questions or concerns.   Tomasa Blase, PharmD Clinical Pharmacist, [place]  07/08/21 7:20 AM  Current Chart prep = 7 minutes

## 2021-07-09 ENCOUNTER — Encounter: Payer: Self-pay | Admitting: Internal Medicine

## 2021-07-10 ENCOUNTER — Other Ambulatory Visit: Payer: Self-pay | Admitting: Cardiology

## 2021-07-10 ENCOUNTER — Other Ambulatory Visit: Payer: Self-pay | Admitting: Internal Medicine

## 2021-07-10 DIAGNOSIS — Z7252 High risk homosexual behavior: Secondary | ICD-10-CM

## 2021-07-20 ENCOUNTER — Telehealth (HOSPITAL_COMMUNITY): Payer: Self-pay | Admitting: Psychiatry

## 2021-07-20 ENCOUNTER — Emergency Department (HOSPITAL_COMMUNITY)
Admission: EM | Admit: 2021-07-20 | Discharge: 2021-07-20 | Disposition: A | Discharge status: Home / Self Care | Payer: Medicare Other | Attending: Emergency Medicine | Admitting: Emergency Medicine

## 2021-07-20 ENCOUNTER — Other Ambulatory Visit (HOSPITAL_COMMUNITY): Payer: Self-pay | Admitting: Psychiatry

## 2021-07-20 ENCOUNTER — Encounter (HOSPITAL_COMMUNITY): Payer: Self-pay | Admitting: Emergency Medicine

## 2021-07-20 DIAGNOSIS — Z5321 Procedure and treatment not carried out due to patient leaving prior to being seen by health care provider: Secondary | ICD-10-CM | POA: Diagnosis not present

## 2021-07-20 DIAGNOSIS — F418 Other specified anxiety disorders: Secondary | ICD-10-CM

## 2021-07-20 DIAGNOSIS — Z760 Encounter for issue of repeat prescription: Secondary | ICD-10-CM | POA: Insufficient documentation

## 2021-07-20 DIAGNOSIS — R531 Weakness: Secondary | ICD-10-CM | POA: Diagnosis not present

## 2021-07-20 DIAGNOSIS — G4700 Insomnia, unspecified: Secondary | ICD-10-CM

## 2021-07-20 MED ORDER — TRAZODONE HCL 100 MG PO TABS: 300.0000 mg | ORAL_TABLET | Freq: Every day | ORAL | 2 refills | Status: DC

## 2021-07-20 NOTE — ED Triage Notes (Signed)
Per EMS-patient has not had his meds in 2 weeks-[employer] called [place] to see if they would refill his meds-they sent him here-states he has been drinking

## 2021-07-20 NOTE — Telephone Encounter (Signed)
Provider did refill medication and sent to preferred pharmacy.  Thank you for initiating a wellness check.  Patient can follow-up with his provider in a few weeks for further evaluation and medication management.

## 2021-07-20 NOTE — Telephone Encounter (Signed)
Patient called office very upset. Demanded to speak with his provider because he needed refills. Patient initially gave identifying information. Upon receiving this information, patient was informed that his provider is currently unavailable for this week. Patient demanded that someone refill his trazadone medication. Writer reviewed patients chart, medication patient is requesting is prescribed by his primary care doctor and he has already requested refill from them. Patient was informed of this information, but wanted his provider at [facility] to prescribe this mediation. Writer informed that this was not possible at this time, but his message can be given to provider for this to be discussed when he returns in office. Patient that yelled "look I need my gotdamnd medicine, if yall aint gon give it to me ima kill my damn self" and hung up. Writer notified nursing staff and other provider in office. Wirter also called [employer] [facility] to conduct a wellness check. When speaking with [employer], Probation officer was informed that patient had already called the police himself stating that he was having a psychotic episode and he has been drinking. Writer left name and number with [facility] for a follow-up call back.

## 2021-07-21 ENCOUNTER — Ambulatory Visit: Payer: Medicare Other | Admitting: Internal Medicine

## 2021-07-28 ENCOUNTER — Other Ambulatory Visit (HOSPITAL_COMMUNITY): Payer: Self-pay | Admitting: Physician Assistant

## 2021-07-28 DIAGNOSIS — F411 Generalized anxiety disorder: Secondary | ICD-10-CM

## 2021-07-30 ENCOUNTER — Encounter (HOSPITAL_COMMUNITY): Payer: Self-pay

## 2021-07-30 ENCOUNTER — Emergency Department (HOSPITAL_COMMUNITY): Payer: Medicare Other

## 2021-07-30 ENCOUNTER — Inpatient Hospital Stay (HOSPITAL_COMMUNITY)
Admission: EM | Admit: 2021-07-30 | Discharge: 2021-08-04 | DRG: 175 | Disposition: A | Discharge status: Home / Self Care | Payer: Medicare Other | Attending: Internal Medicine | Admitting: Internal Medicine

## 2021-07-30 DIAGNOSIS — F10231 Alcohol dependence with withdrawal delirium: Secondary | ICD-10-CM | POA: Diagnosis not present

## 2021-07-30 DIAGNOSIS — F10929 Alcohol use, unspecified with intoxication, unspecified: Secondary | ICD-10-CM

## 2021-07-30 DIAGNOSIS — I10 Essential (primary) hypertension: Secondary | ICD-10-CM | POA: Diagnosis present

## 2021-07-30 DIAGNOSIS — I2693 Single subsegmental pulmonary embolism without acute cor pulmonale: Principal | ICD-10-CM | POA: Diagnosis present

## 2021-07-30 DIAGNOSIS — Z902 Acquired absence of lung [part of]: Secondary | ICD-10-CM | POA: Diagnosis not present

## 2021-07-30 DIAGNOSIS — F10229 Alcohol dependence with intoxication, unspecified: Secondary | ICD-10-CM | POA: Diagnosis present

## 2021-07-30 DIAGNOSIS — Z23 Encounter for immunization: Secondary | ICD-10-CM

## 2021-07-30 DIAGNOSIS — I70202 Unspecified atherosclerosis of native arteries of extremities, left leg: Secondary | ICD-10-CM | POA: Diagnosis present

## 2021-07-30 DIAGNOSIS — F10129 Alcohol abuse with intoxication, unspecified: Secondary | ICD-10-CM

## 2021-07-30 DIAGNOSIS — Y909 Presence of alcohol in blood, level not specified: Secondary | ICD-10-CM | POA: Diagnosis present

## 2021-07-30 DIAGNOSIS — I428 Other cardiomyopathies: Secondary | ICD-10-CM | POA: Diagnosis present

## 2021-07-30 DIAGNOSIS — F331 Major depressive disorder, recurrent, moderate: Secondary | ICD-10-CM | POA: Diagnosis present

## 2021-07-30 DIAGNOSIS — G4700 Insomnia, unspecified: Secondary | ICD-10-CM | POA: Diagnosis not present

## 2021-07-30 DIAGNOSIS — F411 Generalized anxiety disorder: Secondary | ICD-10-CM | POA: Diagnosis present

## 2021-07-30 DIAGNOSIS — Z7951 Long term (current) use of inhaled steroids: Secondary | ICD-10-CM

## 2021-07-30 DIAGNOSIS — Z95820 Peripheral vascular angioplasty status with implants and grafts: Secondary | ICD-10-CM

## 2021-07-30 DIAGNOSIS — R45851 Suicidal ideations: Secondary | ICD-10-CM

## 2021-07-30 DIAGNOSIS — J439 Emphysema, unspecified: Secondary | ICD-10-CM | POA: Diagnosis present

## 2021-07-30 DIAGNOSIS — I2699 Other pulmonary embolism without acute cor pulmonale: Secondary | ICD-10-CM | POA: Diagnosis present

## 2021-07-30 DIAGNOSIS — I5022 Chronic systolic (congestive) heart failure: Secondary | ICD-10-CM | POA: Diagnosis present

## 2021-07-30 DIAGNOSIS — Z886 Allergy status to analgesic agent status: Secondary | ICD-10-CM

## 2021-07-30 DIAGNOSIS — B20 Human immunodeficiency virus [HIV] disease: Secondary | ICD-10-CM | POA: Diagnosis present

## 2021-07-30 DIAGNOSIS — F1010 Alcohol abuse, uncomplicated: Secondary | ICD-10-CM

## 2021-07-30 DIAGNOSIS — F200 Paranoid schizophrenia: Secondary | ICD-10-CM | POA: Diagnosis present

## 2021-07-30 DIAGNOSIS — Z8249 Family history of ischemic heart disease and other diseases of the circulatory system: Secondary | ICD-10-CM

## 2021-07-30 DIAGNOSIS — F419 Anxiety disorder, unspecified: Secondary | ICD-10-CM | POA: Diagnosis present

## 2021-07-30 DIAGNOSIS — E785 Hyperlipidemia, unspecified: Secondary | ICD-10-CM | POA: Diagnosis present

## 2021-07-30 DIAGNOSIS — U071 COVID-19: Secondary | ICD-10-CM | POA: Diagnosis present

## 2021-07-30 DIAGNOSIS — E1151 Type 2 diabetes mellitus with diabetic peripheral angiopathy without gangrene: Secondary | ICD-10-CM | POA: Diagnosis present

## 2021-07-30 DIAGNOSIS — Z716 Tobacco abuse counseling: Secondary | ICD-10-CM

## 2021-07-30 DIAGNOSIS — J449 Chronic obstructive pulmonary disease, unspecified: Secondary | ICD-10-CM | POA: Diagnosis present

## 2021-07-30 DIAGNOSIS — Z818 Family history of other mental and behavioral disorders: Secondary | ICD-10-CM

## 2021-07-30 DIAGNOSIS — F332 Major depressive disorder, recurrent severe without psychotic features: Secondary | ICD-10-CM

## 2021-07-30 DIAGNOSIS — F17210 Nicotine dependence, cigarettes, uncomplicated: Secondary | ICD-10-CM | POA: Diagnosis present

## 2021-07-30 DIAGNOSIS — R4589 Other symptoms and signs involving emotional state: Secondary | ICD-10-CM

## 2021-07-30 DIAGNOSIS — Z888 Allergy status to other drugs, medicaments and biological substances status: Secondary | ICD-10-CM

## 2021-07-30 DIAGNOSIS — I110 Hypertensive heart disease with heart failure: Secondary | ICD-10-CM | POA: Diagnosis present

## 2021-07-30 DIAGNOSIS — Z79899 Other long term (current) drug therapy: Secondary | ICD-10-CM

## 2021-07-30 DIAGNOSIS — R079 Chest pain, unspecified: Secondary | ICD-10-CM

## 2021-07-30 DIAGNOSIS — E519 Thiamine deficiency, unspecified: Secondary | ICD-10-CM

## 2021-07-30 DIAGNOSIS — Z800 Family history of malignant neoplasm of digestive organs: Secondary | ICD-10-CM

## 2021-07-30 DIAGNOSIS — Z823 Family history of stroke: Secondary | ICD-10-CM

## 2021-07-30 DIAGNOSIS — Z85118 Personal history of other malignant neoplasm of bronchus and lung: Secondary | ICD-10-CM | POA: Diagnosis not present

## 2021-07-30 DIAGNOSIS — Z955 Presence of coronary angioplasty implant and graft: Secondary | ICD-10-CM

## 2021-07-30 DIAGNOSIS — Z833 Family history of diabetes mellitus: Secondary | ICD-10-CM

## 2021-07-30 LAB — TROPONIN I (HIGH SENSITIVITY)
Troponin I (High Sensitivity): 7 ng/L (ref <18)
Troponin I (High Sensitivity): 7 ng/L (ref <18)

## 2021-07-30 LAB — CBC WITH DIFFERENTIAL/PLATELET
Abs Immature Granulocytes: 0.05 10*3/uL (ref 0.00–0.07)
Basophils Absolute: 0 10*3/uL (ref 0.0–0.1)
Basophils Relative: 0 %
Eosinophils Absolute: 0.1 10*3/uL (ref 0.0–0.5)
Eosinophils Relative: 1 %
HCT: 49.1 % (ref 39.0–52.0)
Hemoglobin: 17.3 g/dL — ABNORMAL HIGH (ref 13.0–17.0)
Immature Granulocytes: 1 %
Lymphocytes Relative: 54 %
Lymphs Abs: 3.3 10*3/uL (ref 0.7–4.0)
MCH: 32.3 pg (ref 26.0–34.0)
MCHC: 35.2 g/dL (ref 30.0–36.0)
MCV: 91.6 fL (ref 80.0–100.0)
Monocytes Absolute: 0.4 10*3/uL (ref 0.1–1.0)
Monocytes Relative: 7 %
Neutro Abs: 2.2 10*3/uL (ref 1.7–7.7)
Neutrophils Relative %: 37 %
Platelets: 115 10*3/uL — ABNORMAL LOW (ref 150–400)
RBC: 5.36 MIL/uL (ref 4.22–5.81)
RDW: 13.1 % (ref 11.5–15.5)
WBC: 6.1 10*3/uL (ref 4.0–10.5)
nRBC: 0 % (ref 0.0–0.2)

## 2021-07-30 LAB — COMPREHENSIVE METABOLIC PANEL
ALT: 29 U/L (ref 0–44)
AST: 27 U/L (ref 15–41)
Albumin: 3.2 g/dL — ABNORMAL LOW (ref 3.5–5.0)
Alkaline Phosphatase: 82 U/L (ref 38–126)
Anion gap: 12 (ref 5–15)
BUN: <5 mg/dL — ABNORMAL LOW (ref 8–23)
CO2: 23 mmol/L (ref 22–32)
Calcium: 8.1 mg/dL — ABNORMAL LOW (ref 8.9–10.3)
Chloride: 104 mmol/L (ref 98–111)
Creatinine, Ser: 0.65 mg/dL (ref 0.61–1.24)
GFR, Estimated: >60 mL/min (ref >60)
Glucose, Bld: 107 mg/dL — ABNORMAL HIGH (ref 70–99)
Potassium: 3.9 mmol/L (ref 3.5–5.1)
Sodium: 139 mmol/L (ref 135–145)
Total Bilirubin: 0.6 mg/dL (ref 0.3–1.2)
Total Protein: 6.7 g/dL (ref 6.5–8.1)

## 2021-07-30 LAB — D-DIMER, QUANTITATIVE: D-Dimer, Quant: 2.54 ug/mL-FEU — ABNORMAL HIGH (ref 0.00–0.50)

## 2021-07-30 LAB — LIPASE, BLOOD: Lipase: 44 U/L (ref 11–51)

## 2021-07-30 LAB — ETHANOL: Alcohol, Ethyl (B): 291 mg/dL — ABNORMAL HIGH (ref <10)

## 2021-07-30 MED ORDER — SODIUM CHLORIDE 0.9 % IV BOLUS
1000.0000 mL | Freq: Once | INTRAVENOUS | Status: AC
Start: 2021-07-30 — End: 2021-07-30
  Administered 2021-07-30: 1000 mL via INTRAVENOUS

## 2021-07-30 MED ORDER — THIAMINE HCL 100 MG/ML IJ SOLN
100.0000 mg | Freq: Every day | INTRAMUSCULAR | Status: DC
  Administered 2021-07-30: 100 mg via INTRAVENOUS
  Filled 2021-07-30 (×2): qty 2

## 2021-07-30 MED ORDER — NICOTINE 21 MG/24HR TD PT24
21.0000 mg | MEDICATED_PATCH | Freq: Every day | TRANSDERMAL | Status: DC
  Administered 2021-07-31 – 2021-08-04 (×4): 21 mg via TRANSDERMAL
  Filled 2021-07-30 (×5): qty 1

## 2021-07-30 MED ORDER — LORAZEPAM 2 MG/ML IJ SOLN: 0.0000 mg | Freq: Two times a day (BID) | INTRAMUSCULAR | Status: DC

## 2021-07-30 MED ORDER — IOHEXOL 350 MG/ML SOLN
100.0000 mL | Freq: Once | INTRAVENOUS | Status: AC | PRN
  Administered 2021-07-30: 100 mL via INTRAVENOUS

## 2021-07-30 MED ORDER — SODIUM CHLORIDE 0.9 % IV SOLN
1.0000 mg | Freq: Once | INTRAVENOUS | Status: AC
  Administered 2021-07-30: 1 mg via INTRAVENOUS
  Filled 2021-07-30: qty 0.2

## 2021-07-30 MED ORDER — LORAZEPAM 1 MG PO TABS
0.0000 mg | ORAL_TABLET | Freq: Four times a day (QID) | ORAL | Status: AC
  Administered 2021-07-30: 1 mg via ORAL
  Administered 2021-08-01 (×2): 2 mg via ORAL
  Filled 2021-07-30: qty 1
  Filled 2021-07-30 (×2): qty 2

## 2021-07-30 MED ORDER — THIAMINE HCL 100 MG/ML IJ SOLN: 100.0000 mg | Freq: Every day | INTRAMUSCULAR | Status: DC

## 2021-07-30 MED ORDER — FAMOTIDINE IN NACL 20-0.9 MG/50ML-% IV SOLN
20.0000 mg | Freq: Once | INTRAVENOUS | Status: AC
  Administered 2021-07-30: 20 mg via INTRAVENOUS
  Filled 2021-07-30: qty 50

## 2021-07-30 MED ORDER — DIPHENHYDRAMINE HCL 25 MG PO CAPS
25.0000 mg | ORAL_CAPSULE | Freq: Once | ORAL | Status: AC
  Administered 2021-07-30: 25 mg via ORAL
  Filled 2021-07-30: qty 1

## 2021-07-30 MED ORDER — LORAZEPAM 2 MG/ML IJ SOLN
0.0000 mg | Freq: Four times a day (QID) | INTRAMUSCULAR | Status: AC
  Administered 2021-07-31: 2 mg via INTRAVENOUS
  Administered 2021-07-31: 4 mg via INTRAVENOUS
  Administered 2021-07-31 – 2021-08-01 (×2): 2 mg via INTRAVENOUS
  Filled 2021-07-30 (×5): qty 1

## 2021-07-30 MED ORDER — LORAZEPAM 1 MG PO TABS
0.0000 mg | ORAL_TABLET | Freq: Two times a day (BID) | ORAL | Status: DC
Start: 2021-08-02 — End: 2021-08-04
  Administered 2021-08-02: 2 mg via ORAL
  Administered 2021-08-02: 4 mg via ORAL
  Administered 2021-08-03: 1 mg via ORAL
  Filled 2021-07-30: qty 4
  Filled 2021-07-30: qty 1
  Filled 2021-07-30: qty 2

## 2021-07-30 MED ORDER — THIAMINE HCL 100 MG PO TABS
100.0000 mg | ORAL_TABLET | Freq: Every day | ORAL | Status: DC
  Filled 2021-07-30 (×2): qty 1
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
## 2021-07-30 NOTE — ED Notes (Signed)
IVC paperwork completed.

## 2021-07-30 NOTE — ED Notes (Signed)
The pt is c/o itching on his back and his rt arm  no rash seen  the itching has been there for one week

## 2021-07-30 NOTE — ED Triage Notes (Signed)
Sudden onset left side CP after walking to the store, admits to ETOH. No radiation, SOB but has COPD.
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
## 2021-07-31 ENCOUNTER — Inpatient Hospital Stay (HOSPITAL_COMMUNITY): Payer: Medicare Other

## 2021-07-31 DIAGNOSIS — I2699 Other pulmonary embolism without acute cor pulmonale: Secondary | ICD-10-CM

## 2021-07-31 DIAGNOSIS — F332 Major depressive disorder, recurrent severe without psychotic features: Secondary | ICD-10-CM | POA: Diagnosis not present

## 2021-07-31 LAB — RAPID URINE DRUG SCREEN, HOSP PERFORMED
Amphetamines: NOT DETECTED
Barbiturates: NOT DETECTED
Benzodiazepines: NOT DETECTED
Cocaine: NOT DETECTED
Opiates: NOT DETECTED
Tetrahydrocannabinol: NOT DETECTED

## 2021-07-31 LAB — BASIC METABOLIC PANEL
Anion gap: 10 (ref 5–15)
BUN: 5 mg/dL — ABNORMAL LOW (ref 8–23)
CO2: 22 mmol/L (ref 22–32)
Calcium: 8.2 mg/dL — ABNORMAL LOW (ref 8.9–10.3)
Chloride: 109 mmol/L (ref 98–111)
Creatinine, Ser: 0.7 mg/dL (ref 0.61–1.24)
GFR, Estimated: >60 mL/min (ref >60)
Glucose, Bld: 101 mg/dL — ABNORMAL HIGH (ref 70–99)
Potassium: 3.6 mmol/L (ref 3.5–5.1)
Sodium: 141 mmol/L (ref 135–145)

## 2021-07-31 LAB — CBC
HCT: 45.3 % (ref 39.0–52.0)
Hemoglobin: 16 g/dL (ref 13.0–17.0)
MCH: 32.4 pg (ref 26.0–34.0)
MCHC: 35.3 g/dL (ref 30.0–36.0)
MCV: 91.7 fL (ref 80.0–100.0)
Platelets: 101 10*3/uL — ABNORMAL LOW (ref 150–400)
RBC: 4.94 MIL/uL (ref 4.22–5.81)
RDW: 13.2 % (ref 11.5–15.5)
WBC: 5 10*3/uL (ref 4.0–10.5)
nRBC: 0 % (ref 0.0–0.2)

## 2021-07-31 LAB — RESP PANEL BY RT-PCR (FLU A&B, COVID) ARPGX2
Influenza A by PCR: NEGATIVE
Influenza B by PCR: NEGATIVE
SARS Coronavirus 2 by RT PCR: POSITIVE — AB

## 2021-07-31 LAB — GLUCOSE, CAPILLARY
Glucose-Capillary: 112 mg/dL — ABNORMAL HIGH (ref 70–99)
Glucose-Capillary: 111 mg/dL — ABNORMAL HIGH (ref 70–99)
Glucose-Capillary: 127 mg/dL — ABNORMAL HIGH (ref 70–99)

## 2021-07-31 LAB — HEMOGLOBIN A1C
Hgb A1c MFr Bld: 6.1 % — ABNORMAL HIGH (ref 4.8–5.6)
Mean Plasma Glucose: 128.37 mg/dL

## 2021-07-31 LAB — CBG MONITORING, ED: Glucose-Capillary: 109 mg/dL — ABNORMAL HIGH (ref 70–99)

## 2021-07-31 MED ORDER — UMECLIDINIUM-VILANTEROL 62.5-25 MCG/ACT IN AEPB
1.0000 | INHALATION_SPRAY | Freq: Every day | RESPIRATORY_TRACT | Status: DC
  Administered 2021-08-01 – 2021-08-04 (×3): 1 via RESPIRATORY_TRACT
  Filled 2021-07-31: qty 14

## 2021-07-31 MED ORDER — ADULT MULTIVITAMIN W/MINERALS CH
1.0000 | ORAL_TABLET | Freq: Every day | ORAL | Status: DC
  Administered 2021-07-31 – 2021-08-04 (×5): 1 via ORAL
  Filled 2021-07-31 (×5): qty 1

## 2021-07-31 MED ORDER — APIXABAN 5 MG PO TABS
10.0000 mg | ORAL_TABLET | Freq: Two times a day (BID) | ORAL | Status: DC
  Administered 2021-07-31 – 2021-08-04 (×9): 10 mg via ORAL
  Filled 2021-07-31 (×9): qty 2

## 2021-07-31 MED ORDER — ACETAMINOPHEN 325 MG PO TABS
650.0000 mg | ORAL_TABLET | Freq: Four times a day (QID) | ORAL | Status: DC | PRN
  Administered 2021-08-03 – 2021-08-04 (×2): 650 mg via ORAL
  Filled 2021-07-31 (×2): qty 2

## 2021-07-31 MED ORDER — LACTATED RINGERS IV SOLN: INTRAVENOUS | Status: DC

## 2021-07-31 MED ORDER — ALBUTEROL SULFATE HFA 108 (90 BASE) MCG/ACT IN AERS: 2.0000 | INHALATION_SPRAY | RESPIRATORY_TRACT | Status: DC | PRN

## 2021-07-31 MED ORDER — APIXABAN 5 MG PO TABS: 5.0000 mg | ORAL_TABLET | Freq: Two times a day (BID) | ORAL | Status: DC

## 2021-07-31 MED ORDER — CHLORDIAZEPOXIDE HCL 5 MG PO CAPS
20.0000 mg | ORAL_CAPSULE | Freq: Once | ORAL | Status: AC
  Administered 2021-07-31: 20 mg via ORAL
  Filled 2021-07-31: qty 4

## 2021-07-31 MED ORDER — FOLIC ACID 1 MG PO TABS
1.0000 mg | ORAL_TABLET | Freq: Every day | ORAL | Status: DC
  Administered 2021-07-31 – 2021-08-04 (×5): 1 mg via ORAL
  Filled 2021-07-31 (×5): qty 1

## 2021-07-31 MED ORDER — THIAMINE HCL 100 MG/ML IJ SOLN
100.0000 mg | Freq: Every day | INTRAMUSCULAR | Status: DC
  Filled 2021-07-31: qty 2

## 2021-07-31 MED ORDER — INSULIN ASPART 100 UNIT/ML IJ SOLN
0.0000 [IU] | Freq: Three times a day (TID) | INTRAMUSCULAR | Status: DC
Start: 2021-07-31 — End: 2021-08-04
  Administered 2021-08-01 – 2021-08-03 (×4): 1 [IU] via SUBCUTANEOUS
  Administered 2021-08-04: 2 [IU] via SUBCUTANEOUS

## 2021-07-31 MED ORDER — SACUBITRIL-VALSARTAN 24-26 MG PO TABS
1.0000 | ORAL_TABLET | Freq: Two times a day (BID) | ORAL | Status: DC
  Administered 2021-07-31 – 2021-08-04 (×10): 1 via ORAL
  Filled 2021-07-31 (×10): qty 1

## 2021-07-31 MED ORDER — CARVEDILOL 6.25 MG PO TABS
6.2500 mg | ORAL_TABLET | Freq: Two times a day (BID) | ORAL | Status: DC
  Administered 2021-07-31 – 2021-08-03 (×7): 6.25 mg via ORAL
  Filled 2021-07-31 (×3): qty 1
  Filled 2021-07-31: qty 2
  Filled 2021-07-31 (×4): qty 1

## 2021-07-31 MED ORDER — CLONIDINE HCL 0.2 MG PO TABS
0.2000 mg | ORAL_TABLET | Freq: Four times a day (QID) | ORAL | Status: DC | PRN
  Administered 2021-07-31: 0.2 mg via ORAL
  Filled 2021-07-31: qty 1

## 2021-07-31 MED ORDER — SERTRALINE HCL 100 MG PO TABS
100.0000 mg | ORAL_TABLET | Freq: Every day | ORAL | Status: DC
  Administered 2021-07-31 – 2021-08-04 (×5): 100 mg via ORAL
  Filled 2021-07-31 (×5): qty 1

## 2021-07-31 MED ORDER — CHLORDIAZEPOXIDE HCL 5 MG PO CAPS
15.0000 mg | ORAL_CAPSULE | Freq: Three times a day (TID) | ORAL | Status: DC
  Administered 2021-07-31 – 2021-08-01 (×5): 15 mg via ORAL
  Filled 2021-07-31 (×5): qty 3

## 2021-07-31 MED ORDER — INFLUENZA VAC SPLIT QUAD 0.5 ML IM SUSY
0.5000 mL | PREFILLED_SYRINGE | INTRAMUSCULAR | Status: AC
  Administered 2021-08-03: 0.5 mL via INTRAMUSCULAR
  Filled 2021-07-31: qty 0.5

## 2021-07-31 MED ORDER — ACETAMINOPHEN 650 MG RE SUPP: 650.0000 mg | Freq: Four times a day (QID) | RECTAL | Status: DC | PRN

## 2021-07-31 MED ORDER — ALBUTEROL SULFATE (2.5 MG/3ML) 0.083% IN NEBU
2.5000 mg | INHALATION_SOLUTION | RESPIRATORY_TRACT | Status: DC | PRN
  Administered 2021-07-31: 2.5 mg via RESPIRATORY_TRACT
  Filled 2021-07-31: qty 3

## 2021-07-31 MED ORDER — PANTOPRAZOLE SODIUM 40 MG PO TBEC
40.0000 mg | DELAYED_RELEASE_TABLET | Freq: Every day | ORAL | Status: DC
  Administered 2021-07-31 – 2021-08-04 (×5): 40 mg via ORAL
  Filled 2021-07-31 (×5): qty 1

## 2021-07-31 MED ORDER — EMTRICITABINE-TENOFOVIR AF 200-25 MG PO TABS
1.0000 | ORAL_TABLET | Freq: Every day | ORAL | Status: DC
  Administered 2021-07-31 – 2021-08-03 (×4): 1 via ORAL
  Filled 2021-07-31 (×5): qty 1

## 2021-07-31 MED ORDER — SENNOSIDES-DOCUSATE SODIUM 8.6-50 MG PO TABS: 1.0000 | ORAL_TABLET | Freq: Every evening | ORAL | Status: DC | PRN

## 2021-07-31 MED ORDER — PRAVASTATIN SODIUM 40 MG PO TABS
40.0000 mg | ORAL_TABLET | Freq: Every day | ORAL | Status: DC
  Administered 2021-07-31 – 2021-08-04 (×5): 40 mg via ORAL
  Filled 2021-07-31 (×5): qty 1

## 2021-07-31 MED ORDER — THIAMINE HCL 100 MG PO TABS
100.0000 mg | ORAL_TABLET | Freq: Every day | ORAL | Status: DC
  Administered 2021-07-31 – 2021-08-04 (×5): 100 mg via ORAL
  Filled 2021-07-31 (×4): qty 1

## 2021-07-31 MED ORDER — TRAZODONE HCL 100 MG PO TABS
100.0000 mg | ORAL_TABLET | Freq: Every day | ORAL | Status: DC
  Administered 2021-07-31 – 2021-08-03 (×4): 100 mg via ORAL
  Filled 2021-07-31 (×4): qty 1

## 2021-07-31 NOTE — Discharge Instructions (Addendum)
Follow with Primary MD [name], MD and your psychiatrist in 7 days   Get CBC, CMP, 2 view Chest X ray -  checked next visit within 1 week by Primary MD   Activity: As tolerated with Full fall precautions use walker/cane & assistance as needed  Disposition Home   Diet: Heart Healthy Low Carb  Special Instructions: If you have smoked or chewed Tobacco  in the last 2 yrs please stop smoking, stop any regular Alcohol  and or any Recreational drug use.  On your next visit with your primary care physician please Get Medicines reviewed and adjusted.  Please request your Prim.MD to go over all Hospital Tests and Procedure/Radiological results at the follow up, please get all Hospital records sent to your Prim MD by signing hospital release before you go home.  If you experience worsening of your admission symptoms, develop shortness of breath, life threatening emergency, suicidal or homicidal thoughts you must seek medical attention immediately by calling 911 or calling your MD immediately  if symptoms less severe.  You Must read complete instructions/literature along with all the possible adverse reactions/side effects for all the Medicines you take and that have been prescribed to you. Take any new Medicines after you have completely understood and accpet all the possible adverse reactions/side effects.    Information on my medicine - ELIQUIS (apixaban)  Why was Eliquis prescribed for you? Eliquis was prescribed for you to reduce the risk of forming blood clots that can cause a stroke if you have a medical condition called atrial fibrillation (a type of irregular heartbeat) OR to reduce the risk of a blood clots forming after orthopedic surgery.  What do You need to know about Eliquis ? Take your Eliquis TWICE DAILY - one tablet in the morning and one tablet in the evening with or without food.  It would be best to take the doses about the same time each day.  If you have  difficulty swallowing the tablet whole please discuss with your pharmacist how to take the medication safely.  Take Eliquis exactly as prescribed by your doctor and DO NOT stop taking Eliquis without talking to the doctor who prescribed the medication.  Stopping may increase your risk of developing a new clot or stroke.  Refill your prescription before you run out.  After discharge, you should have regular check-up appointments with your healthcare provider that is prescribing your Eliquis.  In the future your dose may need to be changed if your kidney function or weight changes by a significant amount or as you get older.  What do you do if you miss a dose? If you miss a dose, take it as soon as you remember on the same day and resume taking twice daily.  Do not take more than one dose of ELIQUIS at the same time.  Important Safety Information A possible side effect of Eliquis is bleeding. You should call your healthcare provider right away if you experience any of the following: Bleeding from an injury or your nose that does not stop. Unusual colored urine (red or dark brown) or unusual colored stools (red or black). Unusual bruising for unknown reasons. A serious fall or if you hit your head (even if there is no bleeding).  Some medicines may interact with Eliquis and might increase your risk of bleeding or clotting while on Eliquis. To help avoid this, consult your healthcare provider or pharmacist prior to using any new prescription or non-prescription medications, including  herbals, vitamins, non-steroidal anti-inflammatory drugs (NSAIDs) and supplements.  This website has more information on Eliquis (apixaban): [email or web address].

## 2021-07-31 NOTE — Consult Note (Signed)
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 worker consult to facilitate inpatient psychiatric admission  Disposition: Recommend psychiatric Inpatient admission when medically cleared. Supportive therapy provided about ongoing stressors. Psychiatric service will continue to follow the patient  Corena Pilgrim, MD 07/31/2021 2:08 PM

## 2021-07-31 NOTE — ED Notes (Signed)
Up to floor with sitter, belongings bagged and labeled sent with pt to floor, valuables sent to security. [place] locked.

## 2021-07-31 NOTE — ED Notes (Addendum)
Pt resting on stretcher with eyes closed, respirations even and unlabored. IVF infusing without difficulty. No acute changes noted. Lights off, side rails up x2, call bell within reach, urinal at bedside. Will continue to monitor. IVC paperwork at desk.

## 2021-07-31 NOTE — Plan of Care (Signed)
  Problem: Education: Goal: Knowledge of General Education information will improve Description Including pain rating scale, medication(s)/side effects and non-pharmacologic comfort measures Outcome: Progressing   

## 2021-07-31 NOTE — ED Notes (Signed)
Patient transported to CT 

## 2021-07-31 NOTE — Progress Notes (Signed)
PROGRESS NOTE                                                                                                                                                                                                             Patient Demographics:    [name], is a 60 y.o. male, DOB - 1961-02-15, HXT:[id number]  Outpatient Primary MD for the patient is [name], MD    LOS - 1  Admit date - 07/30/2021    Chief Complaint  Patient presents with   Chest Pain       Brief Narrative (HPI from H&P)   [name] is a 60 y.o. male with medical history significant for hypertension, COPD, paranoid schizophrenia, depression/anxiety, nonischemic cardiomyopathy, tobacco abuse, alcohol abuse, noncompliance with medication.  Who presented to the hospital after feeling weak, fall at home, feeling depressed and suicidal.  Was found to have a possible small acute PE and admitted to the hospital.   Subjective:    [name] has, No headache, No chest pain, No abdominal pain - No Nausea, No new weakness tingling or numbness, no SOB.   Assessment  & Plan :     Incidental small acute PE - hemodynamically stable placed on Eliquis continue to monitor.  2.  Alcohol abuse with mechanical fall due to alcohol intoxication.  Currently in DTs placed on Librium, CIWA protocol.  Counseled to quit alcohol and any recreational drug use.  3.  History of paranoid schizophrenia, depression, currently some suicidal ideation.  Psych requested to see the patient, IVC done by ER staff.  4.  History of nonischemic cardiomyopathy chronic systolic heart failure EF around 40%.  Currently appears compensated.  Continue Coreg and Entresto combination.  5.  Dyslipidemia.  On statin.  6. COPD.  Stable no acute issues.  Supportive care.  7.  Incidental COVID-19 infection.  No acute issues monitor with supportive care.  Trend inflammatory  markers.  8. Stage IA (T1a, N0, M0) non-small cell right upper lobe lung cancer, adenocarcinoma diagnosed in November 2016 S/P wedge resection right upper lobe nodule, and thoracoscopic right upper lobectomy under the care of Dr. Roxan Hockey on 09/04/2015. Monitor.  9.  HIV.  Continue home medications.  10.  HTN.  On beta-blocker and Entresto.  11.  PAD.  Currently on Eliquis and statin for  secondary prevention.  Not on any antiplatelets at home, no acute issues.      Condition - Fair  Family Communication  :  None present  Code Status :  Full  Consults  :  Psych  PUD Prophylaxis : PPI    Procedures  :     CTA -  1. Questionable single small right lower lobe segmental pulmonary emboli. 2. Status post right upper lobectomy. No acute cardiopulmonary process. 3. Mild cardiomegaly. 4. Enlarged subcarinal lymph node, new from prior. This is indeterminate in this patient with history of cancer. 5.  Emphysema (ICD10-J43.9).      Disposition Plan  :    Status is: Inpatient  Remains inpatient appropriate because: Suicidal   DVT Prophylaxis  :     apixaban (ELIQUIS) tablet 10 mg  apixaban (ELIQUIS) tablet 5 mg      Lab Results  Component Value Date   PLT 101 (L) 07/31/2021    Diet :  Diet Order             Diet heart healthy/carb modified Room service appropriate? Yes; Fluid consistency: Thin  Diet effective now                    Inpatient Medications  Scheduled Meds:  apixaban  10 mg Oral BID   Followed by   Derrill Memo ON 08/07/2021] apixaban  5 mg Oral BID   carvedilol  6.25 mg Oral BID WC   chlordiazePOXIDE  15 mg Oral TID   emtricitabine-tenofovir AF  1 tablet Oral Daily   folic acid  1 mg Oral Daily   [START ON 08/01/2021] influenza vac split quadrivalent PF  0.5 mL Intramuscular Tomorrow-1000   insulin aspart  0-9 Units Subcutaneous TID WC & HS   LORazepam  0-4 mg Intravenous Q6H   Or   LORazepam  0-4 mg Oral Q6H   [START ON 08/02/2021] LORazepam   0-4 mg Intravenous Q12H   Or   [START ON 08/02/2021] LORazepam  0-4 mg Oral Q12H   multivitamin with minerals  1 tablet Oral Daily   nicotine  21 mg Transdermal Daily   pravastatin  40 mg Oral Daily   sacubitril-valsartan  1 tablet Oral BID   thiamine injection  100 mg Intravenous Daily   thiamine  100 mg Oral Daily   Or   thiamine  100 mg Intravenous Daily   thiamine  100 mg Oral Daily   Or   thiamine  100 mg Intravenous Daily   traZODone  100 mg Oral QHS   umeclidinium-vilanterol  1 puff Inhalation Daily   Continuous Infusions:  lactated ringers 75 mL/hr at 07/31/21 1204   PRN Meds:.acetaminophen **OR** acetaminophen, albuterol, cloNIDine, senna-docusate  Antibiotics  :    Anti-infectives (From admission, onward)    Start     Dose/Rate Route Frequency Ordered Stop   07/31/21 1000  emtricitabine-tenofovir AF (DESCOVY) 200-25 MG per tablet 1 tablet        1 tablet Oral Daily 07/31/21 0236          Time Spent in minutes  30   Lala Lund M.D on 07/31/2021 at 12:50 PM  To page go to [email or web address]   [facility] -  Office  [phone]  See all Orders from [name] for further details    Objective:   Vitals:   07/31/21 0800 07/31/21 0900 07/31/21 0930 07/31/21 1005  BP: (!) 146/85 (!) 158/94 130/85 120/83  Pulse: 91 89 95 91  Resp: (!) 22 20 (!) 24 (!) 22  Temp: 99 F (37.2 C)   98.9 F (37.2 C)  TempSrc: Oral   Oral  SpO2: 96% 94% 96% 96%  Weight:    83.5 kg  Height:    6\' 2"  (1.88 m)    Wt Readings from Last 3 Encounters:  07/31/21 83.5 kg  07/07/21 86.2 kg  03/18/21 84.7 kg     Intake/Output Summary (Last 24 hours) at 07/31/2021 1250 Last data filed at 07/31/2021 1223 Gross per 24 hour  Intake 805 ml  Output --  Net 805 ml     Physical Exam  Awake Alert, No new F.N deficits, Mild DTs [place].AT,PERRAL Supple Neck, No JVD,   Symmetrical Chest wall movement, Good air movement bilaterally, CTAB RRR,No Gallops,Rubs or new Murmurs,  +ve  B.Sounds, Abd Soft, No tenderness,   No Cyanosis, Clubbing or edema       Data Review:    CBC Recent Labs  Lab 07/30/21 1907 07/31/21 0312  WBC 6.1 5.0  HGB 17.3* 16.0  HCT 49.1 45.3  PLT 115* 101*  MCV 91.6 91.7  MCH 32.3 32.4  MCHC 35.2 35.3  RDW 13.1 13.2  LYMPHSABS 3.3  --   MONOABS 0.4  --   EOSABS 0.1  --   BASOSABS 0.0  --     Recent Labs  Lab 07/30/21 1918 07/31/21 0312  NA 139 141  K 3.9 3.6  CL 104 109  CO2 23 22  GLUCOSE 107* 101*  BUN <5* 5*  CREATININE 0.65 0.70  CALCIUM 8.1* 8.2*  AST 27  --   ALT 29  --   ALKPHOS 82  --   BILITOT 0.6  --   ALBUMIN 3.2*  --   DDIMER 2.54*  --   HGBA1C  --  6.1*    ------------------------------------------------------------------------------------------------------------------ No results for input(s): CHOL, HDL, LDLCALC, TRIG, CHOLHDL, LDLDIRECT in the last 72 hours.  Lab Results  Component Value Date   HGBA1C 6.1 (H) 07/31/2021   ------------------------------------------------------------------------------------------------------------------ No results for input(s): TSH, T4TOTAL, T3FREE, THYROIDAB in the last 72 hours.  Invalid input(s): FREET3  Cardiac Enzymes No results for input(s): CKMB, TROPONINI, MYOGLOBIN in the last 168 hours.  Invalid input(s): CK ------------------------------------------------------------------------------------------------------------------    Component Value Date/Time   BNP 48.8 07/28/2020 1521     Radiology Reports CT HEAD WO CONTRAST (5MM)  Result Date: 07/31/2021 CLINICAL DATA:  Head trauma with mental status changes. EXAM: CT HEAD WITHOUT CONTRAST TECHNIQUE: Contiguous axial images were obtained from the base of the skull through the vertex without intravenous contrast. COMPARISON:  Head CT 05/17/2020 FINDINGS: Brain: There is mild cerebral cerebellar atrophy with mild small vessel disease in the cerebral white matter. The ventricles are normal in size and  position. No asymmetry is seen concerning for an acute infarct, hemorrhage or mass. Vascular: There calcifications of the distal vertebral arteries and siphons but no hyperdense central vessels Skull: There is no visible scalp hematoma. The calvarium, skull base and orbits are intact Sinuses/Orbits: There is mild membrane thickening in the paranasal sinuses without fluid levels. The mastoid air cells and middle ears are clear. The nasal septum is S-shaped. Other: None. IMPRESSION: No acute intracranial CT findings, depressed skull fractures, or interval changes. Electronically Signed   By: Telford Nab M.D.   On: 07/31/2021 04:37   CT Angio Chest PE W and/or Wo Contrast  Result Date: 07/30/2021 CLINICAL DATA:  Positive D-dimer. History of non-small cell lung cancer in  the right upper lobe. EXAM: CT ANGIOGRAPHY CHEST WITH CONTRAST TECHNIQUE: Multidetector CT imaging of the chest was performed using the standard protocol during bolus administration of intravenous contrast. Multiplanar CT image reconstructions and MIPs were obtained to evaluate the vascular anatomy. CONTRAST:  134mL OMNIPAQUE IOHEXOL 350 MG/ML SOLN COMPARISON:  CT of the chest 03/16/2021. FINDINGS: Cardiovascular: Opacification of the pulmonary arteries is adequate. There is a questionable single small pulmonary embolism in a right lower lobe segmental branch image 5/105 and 06/26/2006 and 108. No other pulmonary emboli are identified. The main pulmonary artery is nonenlarged. The heart is mildly enlarged. Aorta is normal in size. There is no pericardial effusion. There are atherosclerotic calcifications of the coronary arteries. Mediastinum/Nodes: There is an enlarged subcarinal lymph node measuring 12 mm short axis. This is new. There are nonenlarged bilateral hilar and AP window lymph nodes. The esophagus and visualized thyroid gland are within normal limits. There is a small hiatal hernia. Lungs/Pleura: Patient is status post right upper  lobectomy. Moderate emphysematous changes are again seen. Lungs are otherwise clear. There is no pleural effusion or pneumothorax. Upper Abdomen: No acute abnormality. Musculoskeletal: No chest wall abnormality. No acute or significant osseous findings. Review of the MIP images confirms the above findings. IMPRESSION: 1. Questionable single small right lower lobe segmental pulmonary emboli. 2. Status post right upper lobectomy. No acute cardiopulmonary process. 3. Mild cardiomegaly. 4. Enlarged subcarinal lymph node, new from prior. This is indeterminate in this patient with history of cancer. 5.  Emphysema (ICD10-J43.9). Electronically Signed   By: Ronney Asters M.D.   On: 07/30/2021 22:08   DG Chest Portable 1 View  Result Date: 07/30/2021 CLINICAL DATA:  Sudden onset left-sided chest pain EXAM: PORTABLE CHEST 1 VIEW COMPARISON:  CT chest 03/16/2021 and chest radiograph from 07/28/2020 FINDINGS: Stable scarring along the right hemidiaphragm. Prior right upper lobectomy. The lungs appear otherwise clear. Cardiac and mediastinal margins appear normal. Emphysema noted. IMPRESSION: 1. No acute findings. 2. Emphysema. 3. Prior right upper lobectomy. Electronically Signed   By: Van Clines M.D.   On: 07/30/2021 19:55

## 2021-08-01 DIAGNOSIS — I2699 Other pulmonary embolism without acute cor pulmonale: Secondary | ICD-10-CM | POA: Diagnosis not present

## 2021-08-01 LAB — CBC WITH DIFFERENTIAL/PLATELET
Abs Immature Granulocytes: 0.06 10*3/uL (ref 0.00–0.07)
Basophils Absolute: 0 10*3/uL (ref 0.0–0.1)
Basophils Relative: 0 %
Eosinophils Absolute: 0.1 10*3/uL (ref 0.0–0.5)
Eosinophils Relative: 1 %
HCT: 46.1 % (ref 39.0–52.0)
Hemoglobin: 16 g/dL (ref 13.0–17.0)
Immature Granulocytes: 1 %
Lymphocytes Relative: 27 %
Lymphs Abs: 1.7 10*3/uL (ref 0.7–4.0)
MCH: 31.9 pg (ref 26.0–34.0)
MCHC: 34.7 g/dL (ref 30.0–36.0)
MCV: 91.8 fL (ref 80.0–100.0)
Monocytes Absolute: 0.6 10*3/uL (ref 0.1–1.0)
Monocytes Relative: 9 %
Neutro Abs: 3.9 10*3/uL (ref 1.7–7.7)
Neutrophils Relative %: 62 %
Platelets: 93 10*3/uL — ABNORMAL LOW (ref 150–400)
RBC: 5.02 MIL/uL (ref 4.22–5.81)
RDW: 12.9 % (ref 11.5–15.5)
WBC: 6.3 10*3/uL (ref 4.0–10.5)
nRBC: 0 % (ref 0.0–0.2)

## 2021-08-01 LAB — GLUCOSE, CAPILLARY
Glucose-Capillary: 114 mg/dL — ABNORMAL HIGH (ref 70–99)
Glucose-Capillary: 139 mg/dL — ABNORMAL HIGH (ref 70–99)
Glucose-Capillary: 118 mg/dL — ABNORMAL HIGH (ref 70–99)

## 2021-08-01 LAB — COMPREHENSIVE METABOLIC PANEL
ALT: 21 U/L (ref 0–44)
AST: 18 U/L (ref 15–41)
Albumin: 3 g/dL — ABNORMAL LOW (ref 3.5–5.0)
Alkaline Phosphatase: 66 U/L (ref 38–126)
Anion gap: 7 (ref 5–15)
BUN: 5 mg/dL — ABNORMAL LOW (ref 8–23)
CO2: 23 mmol/L (ref 22–32)
Calcium: 8.6 mg/dL — ABNORMAL LOW (ref 8.9–10.3)
Chloride: 110 mmol/L (ref 98–111)
Creatinine, Ser: 0.76 mg/dL (ref 0.61–1.24)
GFR, Estimated: >60 mL/min (ref >60)
Glucose, Bld: 103 mg/dL — ABNORMAL HIGH (ref 70–99)
Potassium: 3.5 mmol/L (ref 3.5–5.1)
Sodium: 140 mmol/L (ref 135–145)
Total Bilirubin: 1 mg/dL (ref 0.3–1.2)
Total Protein: 6 g/dL — ABNORMAL LOW (ref 6.5–8.1)

## 2021-08-01 LAB — MAGNESIUM: Magnesium: 1.9 mg/dL (ref 1.7–2.4)

## 2021-08-01 MED ORDER — CLONIDINE HCL 0.1 MG PO TABS
0.1000 mg | ORAL_TABLET | Freq: Three times a day (TID) | ORAL | Status: DC
  Administered 2021-08-01 (×3): 0.1 mg via ORAL
  Filled 2021-08-01 (×3): qty 1

## 2021-08-01 MED ORDER — POTASSIUM CHLORIDE CRYS ER 20 MEQ PO TBCR
40.0000 meq | EXTENDED_RELEASE_TABLET | Freq: Once | ORAL | Status: AC
  Administered 2021-08-01: 40 meq via ORAL
  Filled 2021-08-01: qty 2

## 2021-08-01 MED ORDER — MAGNESIUM OXIDE -MG SUPPLEMENT 400 (240 MG) MG PO TABS
800.0000 mg | ORAL_TABLET | Freq: Once | ORAL | Status: AC
  Administered 2021-08-01: 800 mg via ORAL
  Filled 2021-08-01: qty 2

## 2021-08-01 NOTE — Social Work (Addendum)
  CSW spoke with Bernadene Bell from [place] to ascertain their protocol for admittance with pt with COVID. Bernadene Bell stated that she would check with her [facility] and give me a call back.  3:54 PM- CSW received a return call from [facility] and [facility] and was informed that pt would need to outside of a 5 day COVID+ window and at that time would need to be re-evaluated by TTS to ascertain if pt still meets inpt criteria. Pt would also need to be cleared for his COVID symptoms.

## 2021-08-01 NOTE — Progress Notes (Signed)
PROGRESS NOTE                                                                                                                                                                                                             Patient Demographics:    [name], is a 60 y.o. male, DOB - 12/17/1960, XYO:[id number]  Outpatient Primary MD for the patient is [name], MD    LOS - 2  Admit date - 07/30/2021    Chief Complaint  Patient presents with   Chest Pain       Brief Narrative (HPI from H&P)   [name] is a 60 y.o. male with medical history significant for hypertension, COPD, paranoid schizophrenia, depression/anxiety, nonischemic cardiomyopathy, tobacco abuse, alcohol abuse, noncompliance with medication.  Who presented to the hospital after feeling weak, fall at home, feeling depressed and suicidal.  Was found to have a possible small acute PE and admitted to the hospital.   Subjective:   Patient in bed, appears comfortable, denies any headache, no fever, no chest pain or pressure, no shortness of breath , no abdominal pain. No new focal weakness.   Assessment  & Plan :     Incidental small acute PE - hemodynamically stable placed on Eliquis continue to monitor.  2.  Alcohol abuse with mechanical fall due to alcohol intoxication.  DTs are improving but will continue scheduled Librium along with CIWA protocol.  Counseled to quit alcohol and any recreational drug use.  3.  History of paranoid schizophrenia, depression, currently some suicidal ideation.  Seen by psych to be sent to [facility] once medically stable, IVC done by ER staff.  4.  History of nonischemic cardiomyopathy chronic systolic heart failure EF around 40%.  Currently appears compensated.  Continue Coreg and Entresto combination.  5.  Dyslipidemia.  On statin.  6. COPD.  Stable no acute issues.  Supportive care.  7.  Incidental COVID-19 infection.   No acute issues monitor with supportive care.  Trend inflammatory markers.  8. Stage IA (T1a, N0, M0) non-small cell right upper lobe lung cancer, adenocarcinoma diagnosed in November 2016 S/P wedge resection right upper lobe nodule, and thoracoscopic right upper lobectomy under the care of Dr. Roxan Hockey on 09/04/2015. Monitor.  9.  HIV.  Continue home medications.  10.  HTN.  On beta-blocker  and Entresto.  11.  PAD.  Currently on Eliquis and statin for secondary prevention.  Not on any antiplatelets at home, no acute issues.      Condition - Fair  Family Communication  :  None present  Code Status :  Full  Consults  :  Psych  PUD Prophylaxis : PPI    Procedures  :     CTA -  1. Questionable single small right lower lobe segmental pulmonary emboli. 2. Status post right upper lobectomy. No acute cardiopulmonary process. 3. Mild cardiomegaly. 4. Enlarged subcarinal lymph node, new from prior. This is indeterminate in this patient with history of cancer. 5.  Emphysema (ICD10-J43.9).      Disposition Plan  :    Status is: Inpatient  Remains inpatient appropriate because: Suicidal   DVT Prophylaxis  :     apixaban (ELIQUIS) tablet 10 mg  apixaban (ELIQUIS) tablet 5 mg      Lab Results  Component Value Date   PLT 93 (L) 08/01/2021    Diet :  Diet Order             Diet heart healthy/carb modified Room service appropriate? Yes; Fluid consistency: Thin  Diet effective now                    Inpatient Medications  Scheduled Meds:  apixaban  10 mg Oral BID   Followed by   Derrill Memo ON 08/07/2021] apixaban  5 mg Oral BID   carvedilol  6.25 mg Oral BID WC   chlordiazePOXIDE  15 mg Oral TID   cloNIDine  0.1 mg Oral TID   emtricitabine-tenofovir AF  1 tablet Oral Daily   folic acid  1 mg Oral Daily   influenza vac split quadrivalent PF  0.5 mL Intramuscular Tomorrow-1000   insulin aspart  0-9 Units Subcutaneous TID WC & HS   LORazepam  0-4 mg Intravenous  Q6H   Or   LORazepam  0-4 mg Oral Q6H   [START ON 08/02/2021] LORazepam  0-4 mg Intravenous Q12H   Or   [START ON 08/02/2021] LORazepam  0-4 mg Oral Q12H   multivitamin with minerals  1 tablet Oral Daily   nicotine  21 mg Transdermal Daily   pantoprazole  40 mg Oral Daily   pravastatin  40 mg Oral Daily   sacubitril-valsartan  1 tablet Oral BID   sertraline  100 mg Oral Daily   thiamine injection  100 mg Intravenous Daily   thiamine  100 mg Oral Daily   Or   thiamine  100 mg Intravenous Daily   thiamine  100 mg Oral Daily   Or   thiamine  100 mg Intravenous Daily   traZODone  100 mg Oral QHS   umeclidinium-vilanterol  1 puff Inhalation Daily   Continuous Infusions:   PRN Meds:.acetaminophen **OR** acetaminophen, albuterol, senna-docusate  Antibiotics  :    Anti-infectives (From admission, onward)    Start     Dose/Rate Route Frequency Ordered Stop   07/31/21 1000  emtricitabine-tenofovir AF (DESCOVY) 200-25 MG per tablet 1 tablet        1 tablet Oral Daily 07/31/21 0236          Time Spent in minutes  30   Lala Lund M.D on 08/01/2021 at 10:37 AM  To page go to [email or web address]   [facility] -  Office  [phone]  See all Orders from today for further details    Objective:   Vitals:  08/01/21 0531 08/01/21 0626 08/01/21 0858 08/01/21 0903  BP: (!) 153/87   131/74  Pulse: 66  74 71  Resp: 20   16  Temp: 97.7 F (36.5 C)   (!) 97.5 F (36.4 C)  TempSrc: Oral   Oral  SpO2: 99%  100% 99%  Weight:  84.5 kg    Height:  6\' 2"  (1.88 m)      Wt Readings from Last 3 Encounters:  08/01/21 84.5 kg  07/07/21 86.2 kg  03/18/21 84.7 kg     Intake/Output Summary (Last 24 hours) at 08/01/2021 1037 Last data filed at 07/31/2021 1833 Gross per 24 hour  Intake 240 ml  Output --  Net 240 ml     Physical Exam  Awake Alert, Oriented X 3, No new F.N deficits,   [place].AT,PERRAL Supple Neck,No JVD, Symmetrical Chest wall movement, Good air movement  bilaterally, CTAB RRR,No Gallops, Rubs or new Murmurs,   +ve B.Sounds, Abd Soft, No tenderness,  No Cyanosis, Clubbing or edema, No new Rash or bruise      Data Review:    CBC Recent Labs  Lab 07/30/21 1907 07/31/21 0312 08/01/21 0455  WBC 6.1 5.0 6.3  HGB 17.3* 16.0 16.0  HCT 49.1 45.3 46.1  PLT 115* 101* 93*  MCV 91.6 91.7 91.8  MCH 32.3 32.4 31.9  MCHC 35.2 35.3 34.7  RDW 13.1 13.2 12.9  LYMPHSABS 3.3  --  1.7  MONOABS 0.4  --  0.6  EOSABS 0.1  --  0.1  BASOSABS 0.0  --  0.0    Recent Labs  Lab 07/30/21 1918 07/31/21 0312 08/01/21 0455  NA 139 141 140  K 3.9 3.6 3.5  CL 104 109 110  CO2 23 22 23   GLUCOSE 107* 101* 103*  BUN <5* 5* 5*  CREATININE 0.65 0.70 0.76  CALCIUM 8.1* 8.2* 8.6*  AST 27  --  18  ALT 29  --  21  ALKPHOS 82  --  66  BILITOT 0.6  --  1.0  ALBUMIN 3.2*  --  3.0*  MG  --   --  1.9  DDIMER 2.54*  --   --   HGBA1C  --  6.1*  --     ------------------------------------------------------------------------------------------------------------------ No results for input(s): CHOL, HDL, LDLCALC, TRIG, CHOLHDL, LDLDIRECT in the last 72 hours.  Lab Results  Component Value Date   HGBA1C 6.1 (H) 07/31/2021   ------------------------------------------------------------------------------------------------------------------ No results for input(s): TSH, T4TOTAL, T3FREE, THYROIDAB in the last 72 hours.  Invalid input(s): FREET3  Cardiac Enzymes No results for input(s): CKMB, TROPONINI, MYOGLOBIN in the last 168 hours.  Invalid input(s): CK ------------------------------------------------------------------------------------------------------------------    Component Value Date/Time   BNP 48.8 07/28/2020 1521     Radiology Reports CT HEAD WO CONTRAST (5MM)  Result Date: 07/31/2021 CLINICAL DATA:  Head trauma with mental status changes. EXAM: CT HEAD WITHOUT CONTRAST TECHNIQUE: Contiguous axial images were obtained from the base of the  skull through the vertex without intravenous contrast. COMPARISON:  Head CT 05/17/2020 FINDINGS: Brain: There is mild cerebral cerebellar atrophy with mild small vessel disease in the cerebral white matter. The ventricles are normal in size and position. No asymmetry is seen concerning for an acute infarct, hemorrhage or mass. Vascular: There calcifications of the distal vertebral arteries and siphons but no hyperdense central vessels Skull: There is no visible scalp hematoma. The calvarium, skull base and orbits are intact Sinuses/Orbits: There is mild membrane thickening in the paranasal sinuses without fluid  levels. The mastoid air cells and middle ears are clear. The nasal septum is S-shaped. Other: None. IMPRESSION: No acute intracranial CT findings, depressed skull fractures, or interval changes. Electronically Signed   By: Telford Nab M.D.   On: 07/31/2021 04:37   CT Angio Chest PE W and/or Wo Contrast  Result Date: 07/30/2021 CLINICAL DATA:  Positive D-dimer. History of non-small cell lung cancer in the right upper lobe. EXAM: CT ANGIOGRAPHY CHEST WITH CONTRAST TECHNIQUE: Multidetector CT imaging of the chest was performed using the standard protocol during bolus administration of intravenous contrast. Multiplanar CT image reconstructions and MIPs were obtained to evaluate the vascular anatomy. CONTRAST:  153mL OMNIPAQUE IOHEXOL 350 MG/ML SOLN COMPARISON:  CT of the chest 03/16/2021. FINDINGS: Cardiovascular: Opacification of the pulmonary arteries is adequate. There is a questionable single small pulmonary embolism in a right lower lobe segmental branch image 5/105 and 06/26/2006 and 108. No other pulmonary emboli are identified. The main pulmonary artery is nonenlarged. The heart is mildly enlarged. Aorta is normal in size. There is no pericardial effusion. There are atherosclerotic calcifications of the coronary arteries. Mediastinum/Nodes: There is an enlarged subcarinal lymph node measuring 12 mm  short axis. This is new. There are nonenlarged bilateral hilar and AP window lymph nodes. The esophagus and visualized thyroid gland are within normal limits. There is a small hiatal hernia. Lungs/Pleura: Patient is status post right upper lobectomy. Moderate emphysematous changes are again seen. Lungs are otherwise clear. There is no pleural effusion or pneumothorax. Upper Abdomen: No acute abnormality. Musculoskeletal: No chest wall abnormality. No acute or significant osseous findings. Review of the MIP images confirms the above findings. IMPRESSION: 1. Questionable single small right lower lobe segmental pulmonary emboli. 2. Status post right upper lobectomy. No acute cardiopulmonary process. 3. Mild cardiomegaly. 4. Enlarged subcarinal lymph node, new from prior. This is indeterminate in this patient with history of cancer. 5.  Emphysema (ICD10-J43.9). Electronically Signed   By: Ronney Asters M.D.   On: 07/30/2021 22:08   DG Chest Portable 1 View  Result Date: 07/30/2021 CLINICAL DATA:  Sudden onset left-sided chest pain EXAM: PORTABLE CHEST 1 VIEW COMPARISON:  CT chest 03/16/2021 and chest radiograph from 07/28/2020 FINDINGS: Stable scarring along the right hemidiaphragm. Prior right upper lobectomy. The lungs appear otherwise clear. Cardiac and mediastinal margins appear normal. Emphysema noted. IMPRESSION: 1. No acute findings. 2. Emphysema. 3. Prior right upper lobectomy. Electronically Signed   By: Van Clines M.D.   On: 07/30/2021 19:55

## 2021-08-01 NOTE — Evaluation (Signed)
Physical Therapy Evaluation Patient Details Name: [name] MRN: [id number] DOB: 09-Aug-1961 Today's Date: 08/01/2021  History of Present Illness  Pt is a 60 y.o. male admitted to the hospital 11/4 due to chest pain, alcohol intoxication and suicidal thoughts.Chest CT revealed possible small R acute PE. Incidentally found to be covid+. PMH: hypertension, COPD, nonischemic cardiomyopathy on entresto (EF 40%), major depression, anxiety, paranoid schizophrenia, tobacco abuse and alcohol use disorder-severe   Clinical Impression  Pt admitted with above diagnosis. PTA pt lived alone, mod I mobility using rollator vs cane. On eval, pt required supervision bed mobility, min guard assist transfers, and min guard assist ambulation 25' with RW. Mobilized on RA with desat to 87%. Quick rebound to 94% upon sitting EOB with cues for pursed lip breathing. Pt reports he feels sluggish due to just waking up.  Pt currently with functional limitations due to the deficits listed below (see PT Problem List). Pt will benefit from skilled PT to increase their independence and safety with mobility to allow discharge to the venue listed below.          Recommendations for follow up therapy are one component of a multi-disciplinary discharge planning process, led by the attending physician.  Recommendations may be updated based on patient status, additional functional criteria and insurance authorization.  Follow Up Recommendations Home health PT    Assistance Recommended at Discharge Intermittent Supervision/Assistance  Functional Status Assessment Patient has had a recent decline in their functional status and demonstrates the ability to make significant improvements in function in a reasonable and predictable amount of time.  Equipment Recommendations  None recommended by PT    Recommendations for Other Services       Precautions / Restrictions Precautions Precautions: Fall;Other (comment) Precaution  Comments: watch sats      Mobility  Bed Mobility Overal bed mobility: Needs Assistance Bed Mobility: Supine to Sit;Sit to Supine     Supine to sit: Supervision Sit to supine: Supervision   General bed mobility comments: supervision for safety/lines    Transfers Overall transfer level: Needs assistance Equipment used: Rolling walker (2 wheels) Transfers: Sit to/from Stand Sit to Stand: Min guard           General transfer comment: assist for safety, cues for sequencing    Ambulation/Gait Ambulation/Gait assistance: Min guard Gait Distance (Feet): 25 Feet Assistive device: Rolling walker (2 wheels) Gait Pattern/deviations: Step-through pattern;Decreased stride length Gait velocity: decreased   General Gait Details: amb on RA with desat to 87%. 1/4 DOE with minimal activity. Quick rebound to 94% sitting EOB with cues for pursed lip breathing. Difficulty maneuvering around obstacles.  Stairs            Wheelchair Mobility    Modified Rankin (Stroke Patients Only)       Balance Overall balance assessment: Needs assistance Sitting-balance support: No upper extremity supported;Feet supported Sitting balance-Leahy Scale: Good     Standing balance support: Bilateral upper extremity supported;During functional activity Standing balance-Leahy Scale: Poor Standing balance comment: reliant on external support                             Pertinent Vitals/Pain Pain Assessment: Faces Faces Pain Scale: Hurts a little bit Pain Location: R hip and side Pain Descriptors / Indicators: Discomfort Pain Intervention(s): Monitored during session;Repositioned    Home Living Family/patient expects to be discharged to:: Private residence Living Arrangements: Alone Available Help at Discharge: Friend(s);Available PRN/intermittently;Family  Type of Home: Apartment Home Access: Level entry       Home Layout: One level Home Equipment: Conservation officer, nature (2  wheels);Rollator (4 wheels);Cane - single point      Prior Function Prior Level of Function : Independent/Modified Independent             Mobility Comments: amb with cane vs rollator, family/friends drive him to the grocery store, he uses motorized cart in store       Hand Dominance        Extremity/Trunk Assessment   Upper Extremity Assessment Upper Extremity Assessment: Defer to OT evaluation    Lower Extremity Assessment Lower Extremity Assessment: Generalized weakness       Communication   Communication: No difficulties  Cognition Arousal/Alertness: Awake/alert Behavior During Therapy: Flat affect Overall Cognitive Status: Within Functional Limits for tasks assessed                                          General Comments General comments (skin integrity, edema, etc.): SpO2 94% at rest on RA. Desat to 87% on RA during amb.    Exercises     Assessment/Plan    PT Assessment Patient needs continued PT services  PT Problem List Decreased strength;Decreased mobility;Decreased activity tolerance;Cardiopulmonary status limiting activity;Decreased balance;Pain       PT Treatment Interventions Therapeutic activities;Gait training;Therapeutic exercise;Patient/family education;Balance training;Functional mobility training    PT Goals (Current goals can be found in the Care Plan section)  Acute Rehab PT Goals Patient Stated Goal: feel better PT Goal Formulation: With patient Time For Goal Achievement: 08/15/21 Potential to Achieve Goals: Good    Frequency Min 3X/week   Barriers to discharge        Co-evaluation               AM-PAC PT "6 Clicks" Mobility  Outcome Measure Help needed turning from your back to your side while in a flat bed without using bedrails?: None Help needed moving from lying on your back to sitting on the side of a flat bed without using bedrails?: A Little Help needed moving to and from a bed to a chair  (including a wheelchair)?: A Little Help needed standing up from a chair using your arms (e.g., wheelchair or bedside chair)?: A Little Help needed to walk in hospital room?: A Little Help needed climbing 3-5 steps with a railing? : A Lot 6 Click Score: 18    End of Session   Activity Tolerance: Patient limited by fatigue Patient left: in bed;with call bell/phone within reach;with nursing/sitter in room Nurse Communication: Mobility status PT Visit Diagnosis: Difficulty in walking, not elsewhere classified (R26.2);Muscle weakness (generalized) (M62.81)    Time: 4680-3212 PT Time Calculation (min) (ACUTE ONLY): 15 min   Charges:   PT Evaluation $PT Eval Moderate Complexity: 1 Mod          Lorrin Goodell, PT  Office # [phone] Pager [phone]   Lorriane Shire 08/01/2021, 8:41 AM

## 2021-08-02 ENCOUNTER — Other Ambulatory Visit (HOSPITAL_COMMUNITY): Payer: Self-pay

## 2021-08-02 DIAGNOSIS — F332 Major depressive disorder, recurrent severe without psychotic features: Secondary | ICD-10-CM | POA: Diagnosis not present

## 2021-08-02 LAB — CBC WITH DIFFERENTIAL/PLATELET
Abs Immature Granulocytes: 0.03 10*3/uL (ref 0.00–0.07)
Basophils Absolute: 0 10*3/uL (ref 0.0–0.1)
Basophils Relative: 0 %
Eosinophils Absolute: 0.1 10*3/uL (ref 0.0–0.5)
Eosinophils Relative: 1 %
HCT: 41.7 % (ref 39.0–52.0)
Hemoglobin: 14.6 g/dL (ref 13.0–17.0)
Immature Granulocytes: 1 %
Lymphocytes Relative: 36 %
Lymphs Abs: 1.7 10*3/uL (ref 0.7–4.0)
MCH: 31.9 pg (ref 26.0–34.0)
MCHC: 35 g/dL (ref 30.0–36.0)
MCV: 91 fL (ref 80.0–100.0)
Monocytes Absolute: 0.4 10*3/uL (ref 0.1–1.0)
Monocytes Relative: 9 %
Neutro Abs: 2.5 10*3/uL (ref 1.7–7.7)
Neutrophils Relative %: 53 %
Platelets: 88 10*3/uL — ABNORMAL LOW (ref 150–400)
RBC: 4.58 MIL/uL (ref 4.22–5.81)
RDW: 12.6 % (ref 11.5–15.5)
WBC: 4.7 10*3/uL (ref 4.0–10.5)
nRBC: 0 % (ref 0.0–0.2)

## 2021-08-02 LAB — COMPREHENSIVE METABOLIC PANEL
ALT: 19 U/L (ref 0–44)
AST: 18 U/L (ref 15–41)
Albumin: 3 g/dL — ABNORMAL LOW (ref 3.5–5.0)
Alkaline Phosphatase: 63 U/L (ref 38–126)
Anion gap: 6 (ref 5–15)
BUN: 6 mg/dL — ABNORMAL LOW (ref 8–23)
CO2: 24 mmol/L (ref 22–32)
Calcium: 8.7 mg/dL — ABNORMAL LOW (ref 8.9–10.3)
Chloride: 109 mmol/L (ref 98–111)
Creatinine, Ser: 0.66 mg/dL (ref 0.61–1.24)
GFR, Estimated: >60 mL/min (ref >60)
Glucose, Bld: 150 mg/dL — ABNORMAL HIGH (ref 70–99)
Potassium: 3.6 mmol/L (ref 3.5–5.1)
Sodium: 139 mmol/L (ref 135–145)
Total Bilirubin: 0.3 mg/dL (ref 0.3–1.2)
Total Protein: 5.7 g/dL — ABNORMAL LOW (ref 6.5–8.1)

## 2021-08-02 LAB — GLUCOSE, CAPILLARY
Glucose-Capillary: 111 mg/dL — ABNORMAL HIGH (ref 70–99)
Glucose-Capillary: 116 mg/dL — ABNORMAL HIGH (ref 70–99)
Glucose-Capillary: 121 mg/dL — ABNORMAL HIGH (ref 70–99)
Glucose-Capillary: 122 mg/dL — ABNORMAL HIGH (ref 70–99)
Glucose-Capillary: 133 mg/dL — ABNORMAL HIGH (ref 70–99)

## 2021-08-02 LAB — MAGNESIUM: Magnesium: 1.9 mg/dL (ref 1.7–2.4)

## 2021-08-02 MED ORDER — LORAZEPAM 2 MG/ML IJ SOLN
2.0000 mg | Freq: Four times a day (QID) | INTRAMUSCULAR | Status: DC | PRN
  Administered 2021-08-03: 2 mg via INTRAVENOUS
  Filled 2021-08-02 (×2): qty 1

## 2021-08-02 MED ORDER — CLONIDINE HCL 0.1 MG PO TABS: 0.1000 mg | ORAL_TABLET | Freq: Three times a day (TID) | ORAL | Status: DC | PRN

## 2021-08-02 MED ORDER — HALOPERIDOL LACTATE 5 MG/ML IJ SOLN
2.0000 mg | Freq: Four times a day (QID) | INTRAMUSCULAR | Status: DC | PRN
  Administered 2021-08-02: 2 mg via INTRAVENOUS
  Filled 2021-08-02: qty 1

## 2021-08-02 MED ORDER — CHLORDIAZEPOXIDE HCL 5 MG PO CAPS
10.0000 mg | ORAL_CAPSULE | Freq: Three times a day (TID) | ORAL | Status: DC
  Administered 2021-08-02 – 2021-08-03 (×3): 10 mg via ORAL
  Filled 2021-08-02 (×3): qty 2

## 2021-08-02 MED ORDER — POTASSIUM CHLORIDE CRYS ER 20 MEQ PO TBCR
40.0000 meq | EXTENDED_RELEASE_TABLET | Freq: Once | ORAL | Status: AC
  Administered 2021-08-02: 40 meq via ORAL
  Filled 2021-08-02: qty 2

## 2021-08-02 NOTE — Plan of Care (Signed)

## 2021-08-02 NOTE — Progress Notes (Signed)
Pt has attempted to leave several times despite the sitters best efforts to keep him in the room. Pt keeps stating he needs to go home to take care of his mother. Pt agreeable to take medications. Prn medications given. Pt also had lighter and cigarettes which were removed from the room with the help of security.

## 2021-08-02 NOTE — TOC Benefit Eligibility Note (Signed)
Patient Advocate Encounter ° °Insurance verification completed.   ° °The patient is currently admitted and upon discharge could be taking Eliquis 5 mg. ° °The current 30 day co-pay is, $0.00.  ° °The patient is insured through [employer] Medicare Part D  ° ° ° °Cesario Weidinger, CPhT °Pharmacy Patient Advocate Specialist ° Pharmacy Patient Advocate Team °Direct Number: [phone]  Fax: [phone] ° ° ° ° ° °  °

## 2021-08-02 NOTE — Progress Notes (Signed)
PROGRESS NOTE                                                                                                                                                                                                             Patient Demographics:    [name], is a 60 y.o. male, DOB - [birth year], IDP:[id number]  Outpatient Primary MD for the patient is [name], MD    LOS - 3  Admit date - 07/30/2021    Chief Complaint  Patient presents with   Chest Pain       Brief Narrative (HPI from H&P)   [name] is a 60 y.o. male with medical history significant for hypertension, COPD, paranoid schizophrenia, depression/anxiety, nonischemic cardiomyopathy, tobacco abuse, alcohol abuse, noncompliance with medication.  Who presented to the hospital after feeling weak, fall at home, feeling depressed and suicidal.  Was found to have a possible small acute PE and admitted to the hospital.   Subjective:   Patient in bed, appears comfortable, denies any headache, no fever, no chest pain or pressure, no shortness of breath , no abdominal pain. No focal weakness.   Assessment  & Plan :     Incidental small acute PE - hemodynamically stable placed on Eliquis continue to monitor.  2.  Alcohol abuse with mechanical fall due to alcohol intoxication.  DTs are improving but will continue scheduled Librium along with CIWA protocol.  Counseled to quit alcohol and any recreational drug use.  3.  History of paranoid schizophrenia, depression, currently some suicidal ideation.  Defer management of all psych issues to psych team which is following the patient, IVC done by ER staff.  4.  History of nonischemic cardiomyopathy chronic systolic heart failure EF around 40%.  Currently appears compensated.  Continue Coreg and Entresto combination.  5.  Dyslipidemia.  On statin.  6. COPD.  Stable no acute issues.  Supportive care.  7.   Incidental COVID-19 infection.  No acute issues monitor with supportive care.  Trend inflammatory markers.  8. Stage IA (T1a, N0, M0) non-small cell right upper lobe lung cancer, adenocarcinoma diagnosed in November 2016 S/P wedge resection right upper lobe nodule, and thoracoscopic right upper lobectomy under the care of Dr. Roxan Hockey on 09/04/2015. Monitor.  9.  HIV.  Continue home medications.  10.  HTN.  On  beta-blocker and Entresto.  11.  PAD.  Currently on Eliquis and statin for secondary prevention.  Not on any antiplatelets at home, no acute issues.      Condition - Fair  Family Communication  :  None present  Code Status :  Full  Consults  :  Psych  PUD Prophylaxis : PPI    Procedures  :     CTA -  1. Questionable single small right lower lobe segmental pulmonary emboli. 2. Status post right upper lobectomy. No acute cardiopulmonary process. 3. Mild cardiomegaly. 4. Enlarged subcarinal lymph node, new from prior. This is indeterminate in this patient with history of cancer. 5.  Emphysema (ICD10-J43.9).      Disposition Plan  :    Status is: Inpatient  Remains inpatient appropriate because: Suicidal   DVT Prophylaxis  :     apixaban (ELIQUIS) tablet 10 mg  apixaban (ELIQUIS) tablet 5 mg      Lab Results  Component Value Date   PLT 88 (L) 08/02/2021    Diet :  Diet Order             Diet heart healthy/carb modified Room service appropriate? Yes; Fluid consistency: Thin  Diet effective now                    Inpatient Medications  Scheduled Meds:  apixaban  10 mg Oral BID   Followed by   Derrill Memo ON 08/07/2021] apixaban  5 mg Oral BID   carvedilol  6.25 mg Oral BID WC   chlordiazePOXIDE  10 mg Oral TID   emtricitabine-tenofovir AF  1 tablet Oral Daily   folic acid  1 mg Oral Daily   influenza vac split quadrivalent PF  0.5 mL Intramuscular Tomorrow-1000   insulin aspart  0-9 Units Subcutaneous TID WC & HS   LORazepam  0-4 mg Intravenous  Q12H   Or   LORazepam  0-4 mg Oral Q12H   multivitamin with minerals  1 tablet Oral Daily   nicotine  21 mg Transdermal Daily   pantoprazole  40 mg Oral Daily   potassium chloride  40 mEq Oral Once   pravastatin  40 mg Oral Daily   sacubitril-valsartan  1 tablet Oral BID   sertraline  100 mg Oral Daily   thiamine  100 mg Oral Daily   traZODone  100 mg Oral QHS   umeclidinium-vilanterol  1 puff Inhalation Daily   Continuous Infusions:   PRN Meds:.acetaminophen **OR** acetaminophen, albuterol, cloNIDine, senna-docusate  Antibiotics  :    Anti-infectives (From admission, onward)    Start     Dose/Rate Route Frequency Ordered Stop   07/31/21 1000  emtricitabine-tenofovir AF (DESCOVY) 200-25 MG per tablet 1 tablet        1 tablet Oral Daily 07/31/21 0236          Time Spent in minutes  30   Lala Lund M.D on 08/02/2021 at 12:50 PM  To page go to [email or web address]   [facility] -  Office  [phone]  See all Orders from today for further details    Objective:   Vitals:   08/01/21 1954 08/01/21 2358 08/02/21 0431 08/02/21 1049  BP: (!) 152/88 134/74 136/76 119/66  Pulse: 61 (!) 58 (!) 55 68  Resp: 18 20 (!) 23 (!) 22  Temp: 97.7 F (36.5 C) 97.6 F (36.4 C) (!) 97.5 F (36.4 C) (!) 97.5 F (36.4 C)  TempSrc: Oral Oral Oral Oral  SpO2:  100% 99% 100% 100%  Weight:   84.1 kg   Height:        Wt Readings from Last 3 Encounters:  08/02/21 84.1 kg  07/07/21 86.2 kg  03/18/21 84.7 kg     Intake/Output Summary (Last 24 hours) at 08/02/2021 1250 Last data filed at 08/01/2021 2300 Gross per 24 hour  Intake 720 ml  Output --  Net 720 ml     Physical Exam  Awake Alert, No new F.N deficits, Normal affect .AT,PERRAL Supple Neck, No JVD,   Symmetrical Chest wall movement, Good air movement bilaterally, CTAB RRR,No Gallops, Rubs or new Murmurs,  +ve B.Sounds, Abd Soft, No tenderness,   No Cyanosis, Clubbing or edema        Data Review:     CBC Recent Labs  Lab 07/30/21 1907 07/31/21 0312 08/01/21 0455 08/02/21 0112  WBC 6.1 5.0 6.3 4.7  HGB 17.3* 16.0 16.0 14.6  HCT 49.1 45.3 46.1 41.7  PLT 115* 101* 93* 88*  MCV 91.6 91.7 91.8 91.0  MCH 32.3 32.4 31.9 31.9  MCHC 35.2 35.3 34.7 35.0  RDW 13.1 13.2 12.9 12.6  LYMPHSABS 3.3  --  1.7 1.7  MONOABS 0.4  --  0.6 0.4  EOSABS 0.1  --  0.1 0.1  BASOSABS 0.0  --  0.0 0.0    Recent Labs  Lab 07/30/21 1918 07/31/21 0312 08/01/21 0455 08/02/21 0112  NA 139 141 140 139  K 3.9 3.6 3.5 3.6  CL 104 109 110 109  CO2 23 22 23 24   GLUCOSE 107* 101* 103* 150*  BUN <5* 5* 5* 6*  CREATININE 0.65 0.70 0.76 0.66  CALCIUM 8.1* 8.2* 8.6* 8.7*  AST 27  --  18 18  ALT 29  --  21 19  ALKPHOS 82  --  66 63  BILITOT 0.6  --  1.0 0.3  ALBUMIN 3.2*  --  3.0* 3.0*  MG  --   --  1.9 1.9  DDIMER 2.54*  --   --   --   HGBA1C  --  6.1*  --   --     ------------------------------------------------------------------------------------------------------------------ No results for input(s): CHOL, HDL, LDLCALC, TRIG, CHOLHDL, LDLDIRECT in the last 72 hours.  Lab Results  Component Value Date   HGBA1C 6.1 (H) 07/31/2021   ------------------------------------------------------------------------------------------------------------------ No results for input(s): TSH, T4TOTAL, T3FREE, THYROIDAB in the last 72 hours.  Invalid input(s): FREET3  Cardiac Enzymes No results for input(s): CKMB, TROPONINI, MYOGLOBIN in the last 168 hours.  Invalid input(s): CK ------------------------------------------------------------------------------------------------------------------    Component Value Date/Time   BNP 48.8 07/28/2020 1521     Radiology Reports CT HEAD WO CONTRAST (5MM)  Result Date: 07/31/2021 CLINICAL DATA:  Head trauma with mental status changes. EXAM: CT HEAD WITHOUT CONTRAST TECHNIQUE: Contiguous axial images were obtained from the base of the skull through the vertex  without intravenous contrast. COMPARISON:  Head CT 05/17/2020 FINDINGS: Brain: There is mild cerebral cerebellar atrophy with mild small vessel disease in the cerebral white matter. The ventricles are normal in size and position. No asymmetry is seen concerning for an acute infarct, hemorrhage or mass. Vascular: There calcifications of the distal vertebral arteries and siphons but no hyperdense central vessels Skull: There is no visible scalp hematoma. The calvarium, skull base and orbits are intact Sinuses/Orbits: There is mild membrane thickening in the paranasal sinuses without fluid levels. The mastoid air cells and middle ears are clear. The nasal septum is S-shaped. Other: None. IMPRESSION:  No acute intracranial CT findings, depressed skull fractures, or interval changes. Electronically Signed   By: Telford Nab M.D.   On: 07/31/2021 04:37   CT Angio Chest PE W and/or Wo Contrast  Result Date: 07/30/2021 CLINICAL DATA:  Positive D-dimer. History of non-small cell lung cancer in the right upper lobe. EXAM: CT ANGIOGRAPHY CHEST WITH CONTRAST TECHNIQUE: Multidetector CT imaging of the chest was performed using the standard protocol during bolus administration of intravenous contrast. Multiplanar CT image reconstructions and MIPs were obtained to evaluate the vascular anatomy. CONTRAST:  181mL OMNIPAQUE IOHEXOL 350 MG/ML SOLN COMPARISON:  CT of the chest 03/16/2021. FINDINGS: Cardiovascular: Opacification of the pulmonary arteries is adequate. There is a questionable single small pulmonary embolism in a right lower lobe segmental branch image 5/105 and 06/26/2006 and 108. No other pulmonary emboli are identified. The main pulmonary artery is nonenlarged. The heart is mildly enlarged. Aorta is normal in size. There is no pericardial effusion. There are atherosclerotic calcifications of the coronary arteries. Mediastinum/Nodes: There is an enlarged subcarinal lymph node measuring 12 mm short axis. This is new.  There are nonenlarged bilateral hilar and AP window lymph nodes. The esophagus and visualized thyroid gland are within normal limits. There is a small hiatal hernia. Lungs/Pleura: Patient is status post right upper lobectomy. Moderate emphysematous changes are again seen. Lungs are otherwise clear. There is no pleural effusion or pneumothorax. Upper Abdomen: No acute abnormality. Musculoskeletal: No chest wall abnormality. No acute or significant osseous findings. Review of the MIP images confirms the above findings. IMPRESSION: 1. Questionable single small right lower lobe segmental pulmonary emboli. 2. Status post right upper lobectomy. No acute cardiopulmonary process. 3. Mild cardiomegaly. 4. Enlarged subcarinal lymph node, new from prior. This is indeterminate in this patient with history of cancer. 5.  Emphysema (ICD10-J43.9). Electronically Signed   By: Ronney Asters M.D.   On: 07/30/2021 22:08   DG Chest Portable 1 View  Result Date: 07/30/2021 CLINICAL DATA:  Sudden onset left-sided chest pain EXAM: PORTABLE CHEST 1 VIEW COMPARISON:  CT chest 03/16/2021 and chest radiograph from 07/28/2020 FINDINGS: Stable scarring along the right hemidiaphragm. Prior right upper lobectomy. The lungs appear otherwise clear. Cardiac and mediastinal margins appear normal. Emphysema noted. IMPRESSION: 1. No acute findings. 2. Emphysema. 3. Prior right upper lobectomy. Electronically Signed   By: Van Clines M.D.   On: 07/30/2021 19:55
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
## 2021-08-03 DIAGNOSIS — R45851 Suicidal ideations: Secondary | ICD-10-CM

## 2021-08-03 DIAGNOSIS — F332 Major depressive disorder, recurrent severe without psychotic features: Secondary | ICD-10-CM | POA: Diagnosis not present

## 2021-08-03 LAB — CBC WITH DIFFERENTIAL/PLATELET
Abs Immature Granulocytes: 0.03 10*3/uL (ref 0.00–0.07)
Basophils Absolute: 0 10*3/uL (ref 0.0–0.1)
Basophils Relative: 0 %
Eosinophils Absolute: 0.1 10*3/uL (ref 0.0–0.5)
Eosinophils Relative: 2 %
HCT: 44.1 % (ref 39.0–52.0)
Hemoglobin: 15.3 g/dL (ref 13.0–17.0)
Immature Granulocytes: 1 %
Lymphocytes Relative: 40 %
Lymphs Abs: 2 10*3/uL (ref 0.7–4.0)
MCH: 32.3 pg (ref 26.0–34.0)
MCHC: 34.7 g/dL (ref 30.0–36.0)
MCV: 93 fL (ref 80.0–100.0)
Monocytes Absolute: 0.6 10*3/uL (ref 0.1–1.0)
Monocytes Relative: 12 %
Neutro Abs: 2.3 10*3/uL (ref 1.7–7.7)
Neutrophils Relative %: 45 %
Platelets: 92 10*3/uL — ABNORMAL LOW (ref 150–400)
RBC: 4.74 MIL/uL (ref 4.22–5.81)
RDW: 12.8 % (ref 11.5–15.5)
WBC: 5.1 10*3/uL (ref 4.0–10.5)
nRBC: 0 % (ref 0.0–0.2)

## 2021-08-03 LAB — COMPREHENSIVE METABOLIC PANEL
ALT: 20 U/L (ref 0–44)
AST: 16 U/L (ref 15–41)
Albumin: 3.1 g/dL — ABNORMAL LOW (ref 3.5–5.0)
Alkaline Phosphatase: 69 U/L (ref 38–126)
Anion gap: 8 (ref 5–15)
BUN: <5 mg/dL — ABNORMAL LOW (ref 8–23)
CO2: 28 mmol/L (ref 22–32)
Calcium: 9.1 mg/dL (ref 8.9–10.3)
Chloride: 106 mmol/L (ref 98–111)
Creatinine, Ser: 0.84 mg/dL (ref 0.61–1.24)
GFR, Estimated: >60 mL/min (ref >60)
Glucose, Bld: 107 mg/dL — ABNORMAL HIGH (ref 70–99)
Potassium: 4.1 mmol/L (ref 3.5–5.1)
Sodium: 142 mmol/L (ref 135–145)
Total Bilirubin: 0.4 mg/dL (ref 0.3–1.2)
Total Protein: 6.1 g/dL — ABNORMAL LOW (ref 6.5–8.1)

## 2021-08-03 LAB — GLUCOSE, CAPILLARY
Glucose-Capillary: 113 mg/dL — ABNORMAL HIGH (ref 70–99)
Glucose-Capillary: 141 mg/dL — ABNORMAL HIGH (ref 70–99)
Glucose-Capillary: 146 mg/dL — ABNORMAL HIGH (ref 70–99)
Glucose-Capillary: 148 mg/dL — ABNORMAL HIGH (ref 70–99)

## 2021-08-03 LAB — RESP PANEL BY RT-PCR (FLU A&B, COVID) ARPGX2
Influenza A by PCR: NEGATIVE
Influenza B by PCR: NEGATIVE
SARS Coronavirus 2 by RT PCR: POSITIVE — AB

## 2021-08-03 LAB — MAGNESIUM: Magnesium: 2 mg/dL (ref 1.7–2.4)

## 2021-08-03 MED ORDER — CHLORDIAZEPOXIDE HCL 5 MG PO CAPS: 5.0000 mg | ORAL_CAPSULE | Freq: Three times a day (TID) | ORAL | Status: DC

## 2021-08-03 MED ORDER — CHLORDIAZEPOXIDE HCL 5 MG PO CAPS
5.0000 mg | ORAL_CAPSULE | Freq: Two times a day (BID) | ORAL | Status: DC
  Administered 2021-08-03 (×2): 5 mg via ORAL
  Filled 2021-08-03 (×2): qty 1

## 2021-08-03 NOTE — Progress Notes (Signed)
[facility] night cross cover note:  I was contacted by patient's RN who conveyed that the patient is agitated, attempting to leave AMA.  He is under IVC due to suicidal ideations.  He has existing orders for as needed IV Ativan as well as as needed IV Haldol.  Between verbal redirection as well as 2 mg of IV Haldol per existing prn order, de-escalation occurred, with patient now redirected, calm, and willing to remain in the hospital.    Babs Bertin, DO Hospitalist

## 2021-08-03 NOTE — Progress Notes (Signed)
PROGRESS NOTE                                                                                                                                                                                                             Patient Demographics:    [name], is a 60 y.o. male, DOB - November 04, 1960, GSU:[id number]  Outpatient Primary MD for the patient is [name], MD    LOS - 4  Admit date - 07/30/2021    Chief Complaint  Patient presents with   Chest Pain       Brief Narrative (HPI from H&P)   [name] is a 60 y.o. male with medical history significant for hypertension, COPD, paranoid schizophrenia, depression/anxiety, nonischemic cardiomyopathy, tobacco abuse, alcohol abuse, noncompliance with medication.  Who presented to the hospital after feeling weak, fall at home, feeling depressed and suicidal.  Was found to have a possible small acute PE and admitted to the hospital.   Subjective:   Patient in bed, appears comfortable, denies any headache, no fever, no chest pain or pressure, no shortness of breath , no abdominal pain. No new focal weakness.    Assessment  & Plan :     Incidental small acute PE - hemodynamically stable placed on Eliquis continue to monitor.  2.  Alcohol abuse with mechanical fall due to alcohol intoxication.  DTs have almost completely resolved, tapering Librium continue with CIWA protocol.  Counseled to quit alcohol and any recreational drug use.  3.  History of paranoid schizophrenia, depression, currently some suicidal ideation.  Defer management of all psych issues to psych team which is following the patient, IVC done medically stable disposition per psych.  4.  History of nonischemic cardiomyopathy chronic systolic heart failure EF around 40%.  Currently appears compensated.  Continue Coreg and Entresto combination.  5.  Dyslipidemia.  On statin.  6. COPD.  Stable no acute  issues.  Supportive care.  7.  Incidental COVID-19 infection.  No acute issues this is incidental finding, cycle threshold is 35.  No precautions needed.  8. Stage IA (T1a, N0, M0) non-small cell right upper lobe lung cancer, adenocarcinoma diagnosed in November 2016 S/P wedge resection right upper lobe nodule, and thoracoscopic right upper lobectomy under the care of Dr. Roxan Hockey on 09/04/2015. Monitor.  9.  HIV.  Continue home  medications.  10.  HTN.  On beta-blocker and Entresto.  11.  PAD.  Currently on Eliquis and statin for secondary prevention.  Not on any antiplatelets at home, no acute issues.      Condition - Fair  Family Communication  :  None present  Code Status :  Full  Consults  :  Psych  PUD Prophylaxis : PPI    Procedures  :     CTA -  1. Questionable single small right lower lobe segmental pulmonary emboli. 2. Status post right upper lobectomy. No acute cardiopulmonary process. 3. Mild cardiomegaly. 4. Enlarged subcarinal lymph node, new from prior. This is indeterminate in this patient with history of cancer. 5.  Emphysema (ICD10-J43.9).      Disposition Plan  :    Status is: Inpatient  Remains inpatient appropriate because: Suicidal   DVT Prophylaxis  :     apixaban (ELIQUIS) tablet 10 mg  apixaban (ELIQUIS) tablet 5 mg      Lab Results  Component Value Date   PLT 92 (L) 08/03/2021    Diet :  Diet Order             Diet heart healthy/carb modified Room service appropriate? Yes; Fluid consistency: Thin  Diet effective now                    Inpatient Medications  Scheduled Meds:  apixaban  10 mg Oral BID   Followed by   Derrill Memo ON 08/07/2021] apixaban  5 mg Oral BID   carvedilol  6.25 mg Oral BID WC   chlordiazePOXIDE  5 mg Oral TID   emtricitabine-tenofovir AF  1 tablet Oral Daily   folic acid  1 mg Oral Daily   insulin aspart  0-9 Units Subcutaneous TID WC & HS   LORazepam  0-4 mg Intravenous Q12H   Or   LORazepam   0-4 mg Oral Q12H   multivitamin with minerals  1 tablet Oral Daily   nicotine  21 mg Transdermal Daily   pantoprazole  40 mg Oral Daily   pravastatin  40 mg Oral Daily   sacubitril-valsartan  1 tablet Oral BID   sertraline  100 mg Oral Daily   thiamine  100 mg Oral Daily   traZODone  100 mg Oral QHS   umeclidinium-vilanterol  1 puff Inhalation Daily   Continuous Infusions:   PRN Meds:.acetaminophen **OR** acetaminophen, albuterol, cloNIDine, haloperidol lactate, LORazepam, senna-docusate  Antibiotics  :    Anti-infectives (From admission, onward)    Start     Dose/Rate Route Frequency Ordered Stop   07/31/21 1000  emtricitabine-tenofovir AF (DESCOVY) 200-25 MG per tablet 1 tablet        1 tablet Oral Daily 07/31/21 0236          Time Spent in minutes  30   Lala Lund M.D on 08/03/2021 at 11:20 AM  To page go to [email or web address]   [facility] -  Office  [phone]  See all Orders from today for further details    Objective:   Vitals:   08/02/21 1049 08/02/21 1900 08/02/21 1930 08/03/21 0631  BP: 119/66 (!) 121/91 (!) 121/91 137/77  Pulse: 68 85 81 62  Resp: (!) 22 17 16 16   Temp: (!) 97.5 F (36.4 C)  97.9 F (36.6 C)   TempSrc: Oral  Oral   SpO2: 100% 100% 100% 100%  Weight:      Height:        Wt  Readings from Last 3 Encounters:  08/02/21 84.1 kg  07/07/21 86.2 kg  03/18/21 84.7 kg     Intake/Output Summary (Last 24 hours) at 08/03/2021 1120 Last data filed at 08/02/2021 1500 Gross per 24 hour  Intake 837 ml  Output --  Net 837 ml     Physical Exam  Awake Alert, No new F.N deficits, Normal affect [place].AT,PERRAL Supple Neck, No JVD,   Symmetrical Chest wall movement, Good air movement bilaterally, CTAB RRR,No Gallops, Rubs or new Murmurs,  +ve B.Sounds, Abd Soft, No tenderness,   No Cyanosis, Clubbing or edema        Data Review:    CBC Recent Labs  Lab 07/30/21 1907 07/31/21 0312 08/01/21 0455 08/02/21 0112  08/03/21 0349  WBC 6.1 5.0 6.3 4.7 5.1  HGB 17.3* 16.0 16.0 14.6 15.3  HCT 49.1 45.3 46.1 41.7 44.1  PLT 115* 101* 93* 88* 92*  MCV 91.6 91.7 91.8 91.0 93.0  MCH 32.3 32.4 31.9 31.9 32.3  MCHC 35.2 35.3 34.7 35.0 34.7  RDW 13.1 13.2 12.9 12.6 12.8  LYMPHSABS 3.3  --  1.7 1.7 2.0  MONOABS 0.4  --  0.6 0.4 0.6  EOSABS 0.1  --  0.1 0.1 0.1  BASOSABS 0.0  --  0.0 0.0 0.0    Recent Labs  Lab 07/30/21 1918 07/31/21 0312 08/01/21 0455 08/02/21 0112 08/03/21 0349  NA 139 141 140 139 142  K 3.9 3.6 3.5 3.6 4.1  CL 104 109 110 109 106  CO2 23 22 23 24 28   GLUCOSE 107* 101* 103* 150* 107*  BUN <5* 5* 5* 6* <5*  CREATININE 0.65 0.70 0.76 0.66 0.84  CALCIUM 8.1* 8.2* 8.6* 8.7* 9.1  AST 27  --  18 18 16   ALT 29  --  21 19 20   ALKPHOS 82  --  66 63 69  BILITOT 0.6  --  1.0 0.3 0.4  ALBUMIN 3.2*  --  3.0* 3.0* 3.1*  MG  --   --  1.9 1.9 2.0  DDIMER 2.54*  --   --   --   --   HGBA1C  --  6.1*  --   --   --     ------------------------------------------------------------------------------------------------------------------ No results for input(s): CHOL, HDL, LDLCALC, TRIG, CHOLHDL, LDLDIRECT in the last 72 hours.  Lab Results  Component Value Date   HGBA1C 6.1 (H) 07/31/2021   ------------------------------------------------------------------------------------------------------------------ No results for input(s): TSH, T4TOTAL, T3FREE, THYROIDAB in the last 72 hours.  Invalid input(s): FREET3  Cardiac Enzymes No results for input(s): CKMB, TROPONINI, MYOGLOBIN in the last 168 hours.  Invalid input(s): CK ------------------------------------------------------------------------------------------------------------------    Component Value Date/Time   BNP 48.8 07/28/2020 1521     Radiology Reports CT HEAD WO CONTRAST (5MM)  Result Date: 07/31/2021 CLINICAL DATA:  Head trauma with mental status changes. EXAM: CT HEAD WITHOUT CONTRAST TECHNIQUE: Contiguous axial images  were obtained from the base of the skull through the vertex without intravenous contrast. COMPARISON:  Head CT 05/17/2020 FINDINGS: Brain: There is mild cerebral cerebellar atrophy with mild small vessel disease in the cerebral white matter. The ventricles are normal in size and position. No asymmetry is seen concerning for an acute infarct, hemorrhage or mass. Vascular: There calcifications of the distal vertebral arteries and siphons but no hyperdense central vessels Skull: There is no visible scalp hematoma. The calvarium, skull base and orbits are intact Sinuses/Orbits: There is mild membrane thickening in the paranasal sinuses without fluid levels. The mastoid  air cells and middle ears are clear. The nasal septum is S-shaped. Other: None. IMPRESSION: No acute intracranial CT findings, depressed skull fractures, or interval changes. Electronically Signed   By: Telford Nab M.D.   On: 07/31/2021 04:37   CT Angio Chest PE W and/or Wo Contrast  Result Date: 07/30/2021 CLINICAL DATA:  Positive D-dimer. History of non-small cell lung cancer in the right upper lobe. EXAM: CT ANGIOGRAPHY CHEST WITH CONTRAST TECHNIQUE: Multidetector CT imaging of the chest was performed using the standard protocol during bolus administration of intravenous contrast. Multiplanar CT image reconstructions and MIPs were obtained to evaluate the vascular anatomy. CONTRAST:  117mL OMNIPAQUE IOHEXOL 350 MG/ML SOLN COMPARISON:  CT of the chest 03/16/2021. FINDINGS: Cardiovascular: Opacification of the pulmonary arteries is adequate. There is a questionable single small pulmonary embolism in a right lower lobe segmental branch image 5/105 and 06/26/2006 and 108. No other pulmonary emboli are identified. The main pulmonary artery is nonenlarged. The heart is mildly enlarged. Aorta is normal in size. There is no pericardial effusion. There are atherosclerotic calcifications of the coronary arteries. Mediastinum/Nodes: There is an enlarged  subcarinal lymph node measuring 12 mm short axis. This is new. There are nonenlarged bilateral hilar and AP window lymph nodes. The esophagus and visualized thyroid gland are within normal limits. There is a small hiatal hernia. Lungs/Pleura: Patient is status post right upper lobectomy. Moderate emphysematous changes are again seen. Lungs are otherwise clear. There is no pleural effusion or pneumothorax. Upper Abdomen: No acute abnormality. Musculoskeletal: No chest wall abnormality. No acute or significant osseous findings. Review of the MIP images confirms the above findings. IMPRESSION: 1. Questionable single small right lower lobe segmental pulmonary emboli. 2. Status post right upper lobectomy. No acute cardiopulmonary process. 3. Mild cardiomegaly. 4. Enlarged subcarinal lymph node, new from prior. This is indeterminate in this patient with history of cancer. 5.  Emphysema (ICD10-J43.9). Electronically Signed   By: Ronney Asters M.D.   On: 07/30/2021 22:08   DG Chest Portable 1 View  Result Date: 07/30/2021 CLINICAL DATA:  Sudden onset left-sided chest pain EXAM: PORTABLE CHEST 1 VIEW COMPARISON:  CT chest 03/16/2021 and chest radiograph from 07/28/2020 FINDINGS: Stable scarring along the right hemidiaphragm. Prior right upper lobectomy. The lungs appear otherwise clear. Cardiac and mediastinal margins appear normal. Emphysema noted. IMPRESSION: 1. No acute findings. 2. Emphysema. 3. Prior right upper lobectomy. Electronically Signed   By: Van Clines M.D.   On: 07/30/2021 19:55

## 2021-08-03 NOTE — TOC Progression Note (Signed)
Transition of Care [facility]) - Progression Note    Patient Details  Name: [name] MRN: [id number] Date of Birth: 09/09/1961  Transition of Care [facility]) CM/SW [place], [place] Phone Number: 08/03/2021, 3:00 PM  Clinical Narrative:    CSW was informed that pt IVC could be rescinded, CSW completed form and put it on pt chart for MD or psych to sign to be faxed over to the clerk of court. Pt is scheduled to DC tomorrow per MD/Psych.         Expected Discharge Plan and Services                                                 Social Determinants of Health (SDOH) Interventions    Readmission Risk Interventions No flowsheet data found.

## 2021-08-03 NOTE — Progress Notes (Signed)
Physical Therapy Treatment Patient Details Name: [name] MRN: [id number] DOB: [birth year] Today's Date: 08/03/2021   History of Present Illness Pt is a 60 y.o. male admitted to the hospital 11/4 due to chest pain, alcohol intoxication and suicidal thoughts.Chest CT revealed possible small R acute PE. Incidentally found to be covid+. PMH: hypertension, COPD, nonischemic cardiomyopathy on entresto (EF 40%), major depression, anxiety, paranoid schizophrenia, tobacco abuse and alcohol use disorder-severe    PT Comments    Pt agreeable to walking with therapy today, hopeful for discharge today. Pt is currently limited in safe mobility by generalized weakness, and decreased endurance, in presence of increased WoB which pt reports is due to his emphysema. Pt is currently supervision for bed mobility and min guard for transfers and ambulation. HHPT remains appropriate at any pt discharge location. PT will continue to follow acutely.   Recommendations for follow up therapy are one component of a multi-disciplinary discharge planning process, led by the attending physician.  Recommendations may be updated based on patient status, additional functional criteria and insurance authorization.  Follow Up Recommendations  Home health PT     Assistance Recommended at Discharge Intermittent Supervision/Assistance  Equipment Recommendations  None recommended by PT       Precautions / Restrictions Precautions Precautions: Fall;Other (comment) Precaution Comments: watch sats Restrictions Weight Bearing Restrictions: No     Mobility  Bed Mobility Overal bed mobility: Needs Assistance Bed Mobility: Supine to Sit;Sit to Supine     Supine to sit: Supervision Sit to supine: Supervision   General bed mobility comments: supervision for safety    Transfers Overall transfer level: Needs assistance Equipment used: Rolling walker (2 wheels) Transfers: Sit to/from Stand Sit to Stand: Min guard            General transfer comment: assist for safety, cues for sequencing    Ambulation/Gait Ambulation/Gait assistance: Min guard Gait Distance (Feet): 60 Feet Assistive device: Rolling walker (2 wheels) Gait Pattern/deviations: Step-through pattern;Decreased stride length Gait velocity: decreased Gait velocity interpretation: <1.31 ft/sec, indicative of household ambulator   General Gait Details: ambulated on RA SaO2 100%O2 with ambulation, vc for management of RW around obstacles          Balance Overall balance assessment: Needs assistance Sitting-balance support: No upper extremity supported;Feet supported Sitting balance-Leahy Scale: Good     Standing balance support: Bilateral upper extremity supported;During functional activity Standing balance-Leahy Scale: Poor Standing balance comment: reliant on external support                            Cognition Arousal/Alertness: Awake/alert Behavior During Therapy: Flat affect Overall Cognitive Status: Within Functional Limits for tasks assessed                                             General Comments General comments (skin integrity, edema, etc.): max noted HR with ambulation 112bpm, despite increased WoB SaO2 100%O2 on RA with ambulation      Pertinent Vitals/Pain Pain Assessment: Faces Faces Pain Scale: Hurts a little bit Pain Location: B LE Pain Descriptors / Indicators: Discomfort Pain Intervention(s): Limited activity within patient's tolerance;Monitored during session;Repositioned     PT Goals (current goals can now be found in the care plan section) Acute Rehab PT Goals Patient Stated Goal: feel better PT Goal Formulation: With patient Time  For Goal Achievement: 08/15/21 Potential to Achieve Goals: Good Progress towards PT goals: Progressing toward goals    Frequency    Min 3X/week      PT Plan Current plan remains appropriate       AM-PAC PT "6 Clicks"  Mobility   Outcome Measure  Help needed turning from your back to your side while in a flat bed without using bedrails?: None Help needed moving from lying on your back to sitting on the side of a flat bed without using bedrails?: None Help needed moving to and from a bed to a chair (including a wheelchair)?: A Little Help needed standing up from a chair using your arms (e.g., wheelchair or bedside chair)?: A Little Help needed to walk in hospital room?: A Little Help needed climbing 3-5 steps with a railing? : A Lot 6 Click Score: 19    End of Session   Activity Tolerance: Patient tolerated treatment well Patient left: in bed;with call bell/phone within reach;with nursing/sitter in room Nurse Communication: Mobility status PT Visit Diagnosis: Difficulty in walking, not elsewhere classified (R26.2);Muscle weakness (generalized) (M62.81)     Time: 5188-4166 PT Time Calculation (min) (ACUTE ONLY): 13 min  Charges:  $Therapeutic Exercise: 8-22 mins                     Mikea Quadros B. Migdalia Dk PT, DPT Acute Rehabilitation Services Pager [phone] Office [phone]    [place] 08/03/2021, 11:40 AM

## 2021-08-03 NOTE — Progress Notes (Signed)
Mobility Specialist Progress Note:   08/03/21 1648  Mobility  Activity Ambulated in hall  Level of Assistance Standby assist, set-up cues, supervision of patient - no hands on  Assistive Device None  Distance Ambulated (ft) 350 ft  Mobility Ambulated with assistance in hallway  Mobility Response Tolerated well  Mobility performed by Mobility specialist  $Mobility charge 1 Mobility   Pt received in bed willing to participate in mobility. No complaints of pain and asymptomatic. Pt returned to bed with cal bell in reach and all needs met.   [facility] Health and safety inspector Phone [phone]
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
## 2021-08-04 ENCOUNTER — Other Ambulatory Visit (HOSPITAL_COMMUNITY): Payer: Self-pay

## 2021-08-04 DIAGNOSIS — F332 Major depressive disorder, recurrent severe without psychotic features: Secondary | ICD-10-CM | POA: Diagnosis not present

## 2021-08-04 LAB — GLUCOSE, CAPILLARY: Glucose-Capillary: 165 mg/dL — ABNORMAL HIGH (ref 70–99)

## 2021-08-04 LAB — COMPREHENSIVE METABOLIC PANEL
ALT: 23 U/L (ref 0–44)
AST: 19 U/L (ref 15–41)
Albumin: 3.3 g/dL — ABNORMAL LOW (ref 3.5–5.0)
Alkaline Phosphatase: 67 U/L (ref 38–126)
Anion gap: 8 (ref 5–15)
BUN: 10 mg/dL (ref 8–23)
CO2: 22 mmol/L (ref 22–32)
Calcium: 9.2 mg/dL (ref 8.9–10.3)
Chloride: 108 mmol/L (ref 98–111)
Creatinine, Ser: 0.72 mg/dL (ref 0.61–1.24)
GFR, Estimated: >60 mL/min (ref >60)
Glucose, Bld: 113 mg/dL — ABNORMAL HIGH (ref 70–99)
Potassium: 3.9 mmol/L (ref 3.5–5.1)
Sodium: 138 mmol/L (ref 135–145)
Total Bilirubin: 0.7 mg/dL (ref 0.3–1.2)
Total Protein: 6.5 g/dL (ref 6.5–8.1)

## 2021-08-04 LAB — CBC WITH DIFFERENTIAL/PLATELET
Abs Immature Granulocytes: 0.05 10*3/uL (ref 0.00–0.07)
Basophils Absolute: 0 10*3/uL (ref 0.0–0.1)
Basophils Relative: 1 %
Eosinophils Absolute: 0.1 10*3/uL (ref 0.0–0.5)
Eosinophils Relative: 2 %
HCT: 46.4 % (ref 39.0–52.0)
Hemoglobin: 15.9 g/dL (ref 13.0–17.0)
Immature Granulocytes: 1 %
Lymphocytes Relative: 30 %
Lymphs Abs: 1.8 10*3/uL (ref 0.7–4.0)
MCH: 31.9 pg (ref 26.0–34.0)
MCHC: 34.3 g/dL (ref 30.0–36.0)
MCV: 93.2 fL (ref 80.0–100.0)
Monocytes Absolute: 0.7 10*3/uL (ref 0.1–1.0)
Monocytes Relative: 12 %
Neutro Abs: 3.2 10*3/uL (ref 1.7–7.7)
Neutrophils Relative %: 54 %
Platelets: 95 10*3/uL — ABNORMAL LOW (ref 150–400)
RBC: 4.98 MIL/uL (ref 4.22–5.81)
RDW: 13 % (ref 11.5–15.5)
WBC: 5.9 10*3/uL (ref 4.0–10.5)
nRBC: 0 % (ref 0.0–0.2)

## 2021-08-04 LAB — MAGNESIUM: Magnesium: 2 mg/dL (ref 1.7–2.4)

## 2021-08-04 MED ORDER — APIXABAN 5 MG PO TABS
10.0000 mg | ORAL_TABLET | Freq: Two times a day (BID) | ORAL | 0 refills | Status: DC
  Filled 2021-08-04: qty 8, 2d supply, fill #0

## 2021-08-04 MED ORDER — CHLORDIAZEPOXIDE HCL 5 MG PO CAPS: 5.0000 mg | ORAL_CAPSULE | Freq: Every day | ORAL | Status: DC

## 2021-08-04 MED ORDER — PANTOPRAZOLE SODIUM 40 MG PO TBEC
40.0000 mg | DELAYED_RELEASE_TABLET | Freq: Every day | ORAL | 0 refills | Status: AC
  Filled 2021-08-04: qty 30, 30d supply, fill #0

## 2021-08-04 MED ORDER — APIXABAN 5 MG PO TABS
5.0000 mg | ORAL_TABLET | Freq: Two times a day (BID) | ORAL | 0 refills | Status: DC
  Filled 2021-08-04: qty 60, 30d supply, fill #0

## 2021-08-04 MED ORDER — THIAMINE HCL 100 MG PO TABS
50.0000 mg | ORAL_TABLET | Freq: Every day | ORAL | 0 refills | Status: DC
  Filled 2021-08-04: qty 15, 30d supply, fill #0

## 2021-08-04 MED ORDER — FOLIC ACID 1 MG PO TABS
1.0000 mg | ORAL_TABLET | Freq: Every day | ORAL | 0 refills | Status: AC
  Filled 2021-08-04: qty 30, 30d supply, fill #0

## 2021-08-04 MED ORDER — SERTRALINE HCL 100 MG PO TABS
100.0000 mg | ORAL_TABLET | Freq: Every day | ORAL | 0 refills | Status: DC
  Filled 2021-08-04: qty 30, 30d supply, fill #0

## 2021-08-04 NOTE — Progress Notes (Signed)
Mobility Specialist Progress Note:   08/04/21 0950  Mobility  Activity Ambulated in hall  Level of Assistance Standby assist, set-up cues, supervision of patient - no hands on  Assistive Device None  Distance Ambulated (ft) 520 ft  Mobility Ambulated with assistance in hallway  Mobility Response Tolerated well  Mobility performed by Mobility specialist  Bed Position Chair  $Mobility charge 1 Mobility   Pt received willing to participate in mobility. No complaints of pain, a little SOB. Pt returned to chair.    [facility] and safety inspector Phone [phone]

## 2021-08-04 NOTE — Care Management Important Message (Signed)
Important Message  Patient Details  Name: TAGE FEGGINS MRN: [id number] Date of Birth: 1961-06-05   Medicare Important Message Given:  Yes     Shelda Altes 08/04/2021, 10:31 AM

## 2021-08-04 NOTE — Consult Note (Signed)
   [facility] CM Inpatient Consult   08/04/2021  [name] 13-Nov-1960 [id number]  [facility] [[facility]] Patient: [name]  Patient was screened for Embedded practice follow up needs which has a chronic disease management Embedded Care Management team.  Patient was initially IVC noted. Patient transitioned today after IVC was rescinded noted.   Plan: Notification sent to be sent for hospital referral to the [place] Management team.   Please contact for further questions,  Natividad Brood, RN BSN [facility] Liaison  [phone] business mobile phone Toll free office [phone]  Fax number: [phone] [email or web address]

## 2021-08-04 NOTE — Significant Event (Signed)
Patient left the floor with staff to go to bus stop. Did not want ot wait on Case manage has medications and Discharge paperwork.

## 2021-08-04 NOTE — Discharge Summary (Signed)
[name]:[id number] DOB: July 01, 1961 DOA: 07/30/2021  PCP: [name], MD  Admit date: 07/30/2021  Discharge date: 08/04/2021  Admitted From: Home   Disposition:  Home   Recommendations for Outpatient Follow-up:   Follow up with PCP in 1-2 weeks  PCP Please obtain BMP/CBC, 2 view CXR in 1week,  (see Discharge instructions)   PCP Please follow up on the following pending results: Needs to follow with his psychiatrist within a week.   Home Health: PT, SW  Equipment/Devices: None  Consultations: Psych Discharge Condition: Stable    CODE STATUS: Full    Diet Recommendation: Heart Healthy Low Carb   Chief Complaint  Patient presents with   Chest Pain     Brief history of present illness from the day of admission and additional interim summary    [name] is a 60 y.o. male with medical history significant for hypertension, COPD, paranoid schizophrenia, depression/anxiety, nonischemic cardiomyopathy, tobacco abuse, alcohol abuse, noncompliance with medication.  Who presented to the hospital after feeling weak, fall at home, feeling depressed and suicidal.  Was found to have a possible small acute PE and admitted to the hospital.                                                                 Hospital Course   Incidental small acute PE - hemodynamically stable placed on Eliquis now, symptom free on RA.   2.  Alcohol abuse with mechanical fall due to alcohol intoxication.  DTs have almost completely resolved. Counseled to quit alcohol and any recreational drug use.   3.  History of paranoid schizophrenia, depression, currently some suicidal ideation.  By psych now cleared for home discharge, Zoloft dose increased per psych.  Must follow with his primary psychiatrist within a week.  Currently not  suicidal homicidal.   4.  History of nonischemic cardiomyopathy chronic systolic heart failure EF around 40%.  Currently appears compensated.  Continue Coreg and Entresto combination.   5.  Dyslipidemia.  On statin.   6. COPD.  Stable no acute issues.  Supportive care.   7.  Incidental COVID-19 infection.  No acute issues this is incidental finding, cycle threshold is 35.  No precautions needed.   8. Stage IA (T1a, N0, M0) non-small cell right upper lobe lung cancer, adenocarcinoma diagnosed in November 2016 S/P wedge resection right upper lobe nodule, and thoracoscopic right upper lobectomy under the care of Dr. Roxan Hockey on 09/04/2015. Monitor.   9.  HIV.  Continue home medications.   10.  HTN.  On beta-blocker and Entresto.   11.  PAD.  Currently on Eliquis and statin for secondary prevention.  Not on any antiplatelets at home, no acute issues.     Discharge diagnosis     Principal Problem:  Major depressive disorder, recurrent, severe without psychotic behavior ([place]) Active Problems:   HYPERTENSION, BENIGN   Nonischemic cardiomyopathy (HCC)   Paranoid schizophrenia ([place])   COPD GOLD II if use fev1/VC and still smoking    Generalized anxiety disorder   Moderate episode of recurrent major depressive disorder ([place])   Pulmonary embolism (HCC)   Alcohol abuse with intoxication ([place])   Suicidal ideation    Discharge instructions    Discharge Instructions     Diet - low sodium heart healthy   Complete by: As directed    Discharge instructions   Complete by: As directed    Follow with Primary MD [name], MD and your psychiatrist in 7 days   Get CBC, CMP, 2 view Chest X ray -  checked next visit within 1 week by Primary MD   Activity: As tolerated with Full fall precautions use walker/cane & assistance as needed  Disposition Home   Diet: Heart Healthy Low Carb  Special Instructions: If you have smoked or chewed Tobacco  in the last 2 yrs please stop  smoking, stop any regular Alcohol  and or any Recreational drug use.  On your next visit with your primary care physician please Get Medicines reviewed and adjusted.  Please request your Prim.MD to go over all Hospital Tests and Procedure/Radiological results at the follow up, please get all Hospital records sent to your Prim MD by signing hospital release before you go home.  If you experience worsening of your admission symptoms, develop shortness of breath, life threatening emergency, suicidal or homicidal thoughts you must seek medical attention immediately by calling 911 or calling your MD immediately  if symptoms less severe.  You Must read complete instructions/literature along with all the possible adverse reactions/side effects for all the Medicines you take and that have been prescribed to you. Take any new Medicines after you have completely understood and accpet all the possible adverse reactions/side effects.   Increase activity slowly   Complete by: As directed        Discharge Medications   Allergies as of 08/04/2021       Reactions   Ace Inhibitors Cough   Aspirin Other (See Comments)   Reaction:  Nose bleeds and GI bleeding    Clopidogrel Bisulfate Other (See Comments)   Reaction:  Nose bleeds    Crestor [rosuvastatin Calcium] Other (See Comments)   Reaction:  Leg cramps    Metformin And Related Diarrhea   Rosuvastatin Cough        Medication List     TAKE these medications    acetaminophen 500 MG tablet Commonly known as: TYLENOL Take 1,000 mg by mouth 2 (two) times daily as needed for moderate pain.   albuterol 108 (90 Base) MCG/ACT inhaler Commonly known as: ProAir HFA Inhale 2 puffs into the lungs every 4 (four) hours as needed for wheezing.   albuterol (2.5 MG/3ML) 0.083% nebulizer solution Commonly known as: PROVENTIL Take 3 mLs (2.5 mg total) by nebulization every 6 (six) hours as needed for wheezing or shortness of breath.   Anoro Ellipta  62.5-25 MCG/ACT Aepb Generic drug: umeclidinium-vilanterol Inhale 1 puff into the lungs daily.   apixaban 5 MG Tabs tablet Commonly known as: ELIQUIS Take 2 tablets (10 mg total) by mouth 2 (two) times daily for 2 days.   apixaban 5 MG Tabs tablet Commonly known as: ELIQUIS Take 1 tablet (5 mg total) by mouth 2 (two) times daily. Start taking on: August 07, 2021  busPIRone 30 MG tablet Commonly known as: BUSPAR Take 1 tablet (30 mg total) by mouth 2 (two) times daily.   carvedilol 6.25 MG tablet Commonly known as: COREG Take 1 tablet (6.25 mg total) by mouth 2 (two) times daily with a meal.   cetirizine 10 MG tablet Commonly known as: ZYRTEC Take 1 tablet (10 mg total) by mouth daily.   Descovy 200-25 MG tablet Generic drug: emtricitabine-tenofovir AF Take 1 tablet by mouth daily.   Entresto 24-26 MG Generic drug: sacubitril-valsartan Take 1 tablet by mouth 2 (two) times daily.   fluticasone 50 MCG/ACT nasal spray Commonly known as: FLONASE Place 2 sprays into both nostrils daily.   folic acid 1 MG tablet Commonly known as: FOLVITE Take 1 tablet (1 mg total) by mouth daily. Start taking on: August 05, 2021   hydrOXYzine 50 MG tablet Commonly known as: ATARAX/VISTARIL Take 1 tablet (50 mg total) by mouth 3 (three) times daily as needed. What changed: reasons to take this   multivitamin with minerals Tabs tablet Take 1 tablet by mouth daily.   pantoprazole 40 MG tablet Commonly known as: PROTONIX Take 1 tablet (40 mg total) by mouth daily. Start taking on: August 05, 2021   pravastatin 40 MG tablet Commonly known as: PRAVACHOL Take 1 tablet (40 mg total) by mouth daily.   sertraline 100 MG tablet Commonly known as: ZOLOFT Take 1 tablet (100 mg total) by mouth daily. Start taking on: August 05, 2021 What changed:  medication strength how much to take   thiamine 50 MG tablet Commonly known as: VITAMIN B-1 Take 1 tablet (50 mg total) by mouth  daily.   traZODone 100 MG tablet Commonly known as: DESYREL Take 3 tablets (300 mg total) by mouth at bedtime. What changed: how much to take         Follow-up Information     [name], MD. Schedule an appointment as soon as possible for a visit in 1 week(s).   Specialty: Internal Medicine Contact information: [place] [address] [phone]         Jerline Pain, MD .   Specialty: Cardiology Contact information: [phone] [address] [place] [address] [place] [address] [phone]                 Major procedures and Radiology Reports - PLEASE review detailed and final reports thoroughly  -      CT HEAD WO CONTRAST (5MM)  Result Date: 07/31/2021 CLINICAL DATA:  Head trauma with mental status changes. EXAM: CT HEAD WITHOUT CONTRAST TECHNIQUE: Contiguous axial images were obtained from the base of the skull through the vertex without intravenous contrast. COMPARISON:  Head CT 05/17/2020 FINDINGS: Brain: There is mild cerebral cerebellar atrophy with mild small vessel disease in the cerebral white matter. The ventricles are normal in size and position. No asymmetry is seen concerning for an acute infarct, hemorrhage or mass. Vascular: There calcifications of the distal vertebral arteries and siphons but no hyperdense central vessels Skull: There is no visible scalp hematoma. The calvarium, skull base and orbits are intact Sinuses/Orbits: There is mild membrane thickening in the paranasal sinuses without fluid levels. The mastoid air cells and middle ears are clear. The nasal septum is S-shaped. Other: None. IMPRESSION: No acute intracranial CT findings, depressed skull fractures, or interval changes. Electronically Signed   By: Telford Nab M.D.   On: 07/31/2021 04:37   CT Angio Chest PE W and/or Wo Contrast  Result Date: 07/30/2021 CLINICAL DATA:  Positive D-dimer. History of non-small cell lung cancer in the right upper lobe. EXAM: CT  ANGIOGRAPHY CHEST WITH CONTRAST TECHNIQUE: Multidetector CT imaging of the chest was performed using the standard protocol during bolus administration of intravenous contrast. Multiplanar CT image reconstructions and MIPs were obtained to evaluate the vascular anatomy. CONTRAST:  170mL OMNIPAQUE IOHEXOL 350 MG/ML SOLN COMPARISON:  CT of the chest 03/16/2021. FINDINGS: Cardiovascular: Opacification of the pulmonary arteries is adequate. There is a questionable single small pulmonary embolism in a right lower lobe segmental branch image 5/105 and 06/26/2006 and 108. No other pulmonary emboli are identified. The main pulmonary artery is nonenlarged. The heart is mildly enlarged. Aorta is normal in size. There is no pericardial effusion. There are atherosclerotic calcifications of the coronary arteries. Mediastinum/Nodes: There is an enlarged subcarinal lymph node measuring 12 mm short axis. This is new. There are nonenlarged bilateral hilar and AP window lymph nodes. The esophagus and visualized thyroid gland are within normal limits. There is a small hiatal hernia. Lungs/Pleura: Patient is status post right upper lobectomy. Moderate emphysematous changes are again seen. Lungs are otherwise clear. There is no pleural effusion or pneumothorax. Upper Abdomen: No acute abnormality. Musculoskeletal: No chest wall abnormality. No acute or significant osseous findings. Review of the MIP images confirms the above findings. IMPRESSION: 1. Questionable single small right lower lobe segmental pulmonary emboli. 2. Status post right upper lobectomy. No acute cardiopulmonary process. 3. Mild cardiomegaly. 4. Enlarged subcarinal lymph node, new from prior. This is indeterminate in this patient with history of cancer. 5.  Emphysema (ICD10-J43.9). Electronically Signed   By: Ronney Asters M.D.   On: 07/30/2021 22:08   DG Chest Portable 1 View  Result Date: 07/30/2021 CLINICAL DATA:  Sudden onset left-sided chest pain EXAM:  PORTABLE CHEST 1 VIEW COMPARISON:  CT chest 03/16/2021 and chest radiograph from 07/28/2020 FINDINGS: Stable scarring along the right hemidiaphragm. Prior right upper lobectomy. The lungs appear otherwise clear. Cardiac and mediastinal margins appear normal. Emphysema noted. IMPRESSION: 1. No acute findings. 2. Emphysema. 3. Prior right upper lobectomy. Electronically Signed   By: Van Clines M.D.   On: 07/30/2021 19:55     Today   Subjective    [name] today has no headache,no chest abdominal pain,no new weakness tingling or numbness, feels much better wants to go home today.      Objective   Blood pressure 126/86, pulse 73, temperature 98.4 F (36.9 C), temperature source Oral, resp. rate 18, height 6\' 2"  (1.88 m), weight 86.9 kg, SpO2 97 %.   Intake/Output Summary (Last 24 hours) at 08/04/2021 0929 Last data filed at 08/04/2021 0900 Gross per 24 hour  Intake 1560 ml  Output --  Net 1560 ml    Exam  Awake Alert, No new F.N deficits, Normal affect [place].AT,PERRAL Supple Neck,No JVD, No cervical lymphadenopathy appriciated.  Symmetrical Chest wall movement, Good air movement bilaterally, CTAB RRR,No Gallops,Rubs or new Murmurs, No Parasternal Heave +ve B.Sounds, Abd Soft, Non tender, No organomegaly appriciated, No rebound -guarding or rigidity. No Cyanosis, Clubbing or edema, No new Rash or bruise   Data Review   CBC w Diff:  Lab Results  Component Value Date   WBC 5.9 08/04/2021   HGB 15.9 08/04/2021   HGB 16.2 03/16/2021   HGB 15.7 04/20/2017   HCT 46.4 08/04/2021   HCT 47.6 04/20/2017   PLT 95 (L) 08/04/2021   PLT 93 (L) 03/16/2021   PLT 113 (L)  04/20/2017   LYMPHOPCT 30 08/04/2021   LYMPHOPCT 28.3 04/20/2017   MONOPCT 12 08/04/2021   MONOPCT 11.1 04/20/2017   EOSPCT 2 08/04/2021   EOSPCT 0.8 04/20/2017   BASOPCT 1 08/04/2021   BASOPCT 0.6 04/20/2017    CMP:  Lab Results  Component Value Date   NA 138 08/04/2021   NA 141 04/13/2016   K 3.9  08/04/2021   K 4.2 04/13/2016   CL 108 08/04/2021   CO2 22 08/04/2021   CO2 22 04/13/2016   BUN 10 08/04/2021   BUN 11.6 04/13/2016   CREATININE 0.72 08/04/2021   CREATININE 0.82 03/16/2021   CREATININE 0.8 04/13/2016   PROT 6.5 08/04/2021   PROT 6.8 04/13/2016   ALBUMIN 3.3 (L) 08/04/2021   ALBUMIN 3.4 (L) 04/13/2016   BILITOT 0.7 08/04/2021   BILITOT 0.7 03/16/2021   BILITOT 0.49 04/13/2016   ALKPHOS 67 08/04/2021   ALKPHOS 66 04/13/2016   AST 19 08/04/2021   AST 22 03/16/2021   AST 23 04/13/2016   ALT 23 08/04/2021   ALT 30 03/16/2021   ALT 37 04/13/2016  .   Total Time in preparing paper work, data evaluation and todays exam - 58 minutes  Lala Lund M.D on 08/04/2021 at 9:29 AM  [facility]

## 2021-08-04 NOTE — Progress Notes (Signed)
Physical Therapy Treatment Patient Details Name: [name] MRN: [id number] DOB: 11/16/1960 Today's Date: 08/04/2021   History of Present Illness Pt is a 60 y.o. male admitted to the hospital 11/4 due to chest pain, alcohol intoxication and suicidal thoughts.Chest CT revealed possible small R acute PE. Incidentally found to be covid+. PMH: hypertension, COPD, nonischemic cardiomyopathy on entresto (EF 40%), major depression, anxiety, paranoid schizophrenia, tobacco abuse and alcohol use disorder-severe    PT Comments    Focused session on progressing pt's dynamic balance in standing and during gait to improve safety with functional tasks. Pt does display some mild unsteadiness when challenging his vestibular system during gait, but no LOB even without UE support. Educated pt on energy conservation and breathing techniques. Educated pt and simulated tub/shower transfers with grab bars, min guard to complete for safety due to noted instability. Pt would benefit from HHPT to address his deficits to decrease his risk for falls. Will continue to follow acutely.    Recommendations for follow up therapy are one component of a multi-disciplinary discharge planning process, led by the attending physician.  Recommendations may be updated based on patient status, additional functional criteria and insurance authorization.  Follow Up Recommendations  Home health PT     Assistance Recommended at Discharge Intermittent Supervision/Assistance  Equipment Recommendations  None recommended by PT    Recommendations for Other Services       Precautions / Restrictions Precautions Precautions: Fall;Other (comment) Precaution Comments: watch sats Restrictions Weight Bearing Restrictions: No     Mobility  Bed Mobility               General bed mobility comments: Pt standing in room upon arrival.    Transfers                   General transfer comment: Pt standing in room upon  arrival. Educated pt on shower/tub transfers with grab bars, pt demonstrating anterior approach (over trash can sideways) with grab bars simulated to be anterior to him (back of chair) without LOB, min guard for safety.    Ambulation/Gait Ambulation/Gait assistance: Supervision Gait Distance (Feet): 370 Feet Assistive device: None Gait Pattern/deviations: Step-through pattern;Decreased stride length Gait velocity: decreased Gait velocity interpretation: 1.31 - 2.62 ft/sec, indicative of limited community ambulator   General Gait Details: Pt with slow, but mostly steady gait, dragging feet at times. Cues provided to pick up feet when crossing thresholds or hard wood > carpet for safety at home, verbalized understanding. Challenged pt's dynamic gait balance through cuing pt to change speeds, slow but gradual success and no LOB, and to change head positions with noted decline in speed and mild unsteadiness but no LOB.   Stairs             Wheelchair Mobility    Modified Rankin (Stroke Patients Only)       Balance Overall balance assessment: Needs assistance Sitting-balance support: No upper extremity supported;Feet supported Sitting balance-Leahy Scale: Good     Standing balance support: During functional activity;No upper extremity supported Standing balance-Leahy Scale: Good Standing balance comment: Able to reach mod off BOS and to floor with supervision, mild instability noted with vestibular challenges when ambulating.                            Cognition Arousal/Alertness: Awake/alert Behavior During Therapy: WFL for tasks assessed/performed Overall Cognitive Status: Within Functional Limits for tasks assessed  Exercises      General Comments General comments (skin integrity, edema, etc.): SpO2 >/= 96% on RA throughout; educated pt on energy conservation techniques and breathing techniques       Pertinent Vitals/Pain Pain Assessment: Faces Faces Pain Scale: No hurt Pain Intervention(s): Monitored during session    Home Living                          Prior Function            PT Goals (current goals can now be found in the care plan section) Acute Rehab PT Goals Patient Stated Goal: to [name] home PT Goal Formulation: With patient Time For Goal Achievement: 08/15/21 Potential to Achieve Goals: Good Progress towards PT goals: Progressing toward goals    Frequency    Min 3X/week      PT Plan Current plan remains appropriate    Co-evaluation              AM-PAC PT "6 Clicks" Mobility   Outcome Measure  Help needed turning from your back to your side while in a flat bed without using bedrails?: None Help needed moving from lying on your back to sitting on the side of a flat bed without using bedrails?: None Help needed moving to and from a bed to a chair (including a wheelchair)?: A Little Help needed standing up from a chair using your arms (e.g., wheelchair or bedside chair)?: A Little Help needed to walk in hospital room?: A Little Help needed climbing 3-5 steps with a railing? : A Little 6 Click Score: 20    End of Session   Activity Tolerance: Patient tolerated treatment well Patient left: in bed;with call bell/phone within reach Nurse Communication: Mobility status PT Visit Diagnosis: Difficulty in walking, not elsewhere classified (R26.2);Muscle weakness (generalized) (M62.81);Unsteadiness on feet (R26.81);Other abnormalities of gait and mobility (R26.89)     Time: 4709-6283 PT Time Calculation (min) (ACUTE ONLY): 10 min  Charges:  $Therapeutic Activity: 8-22 mins                     Moishe Spice, PT, DPT Acute Rehabilitation Services  Pager: [phone] Office: [place] 08/04/2021, 8:35 AM

## 2021-08-05 ENCOUNTER — Other Ambulatory Visit: Payer: Self-pay | Admitting: Internal Medicine

## 2021-08-05 ENCOUNTER — Telehealth (INDEPENDENT_AMBULATORY_CARE_PROVIDER_SITE_OTHER): Payer: Medicare Other | Admitting: Physician Assistant

## 2021-08-05 ENCOUNTER — Telehealth: Payer: Self-pay

## 2021-08-05 DIAGNOSIS — F332 Major depressive disorder, recurrent severe without psychotic features: Secondary | ICD-10-CM | POA: Diagnosis not present

## 2021-08-05 DIAGNOSIS — F410 Panic disorder [episodic paroxysmal anxiety] without agoraphobia: Secondary | ICD-10-CM | POA: Diagnosis not present

## 2021-08-05 DIAGNOSIS — F411 Generalized anxiety disorder: Secondary | ICD-10-CM | POA: Diagnosis not present

## 2021-08-05 DIAGNOSIS — J449 Chronic obstructive pulmonary disease, unspecified: Secondary | ICD-10-CM

## 2021-08-05 DIAGNOSIS — J301 Allergic rhinitis due to pollen: Secondary | ICD-10-CM
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
## 2021-08-05 NOTE — Telephone Encounter (Signed)
Transition Care Management Unsuccessful Follow-up Telephone Call  Date of discharge and from where:  08/04/21 [name]  Attempts:  1st Attempt  Reason for unsuccessful TCM follow-up call:  Left voice message on mobile # unable to leave message on home #

## 2021-08-06 IMAGING — DX DG CHEST 1V PORT
1 series · 1 of 1 positions shown · non-contrast
Comparison: October 14, 2019

CLINICAL DATA: Fall, alcohol intoxication

EXAM:
PORTABLE CHEST 1 VIEW

[chest ap]
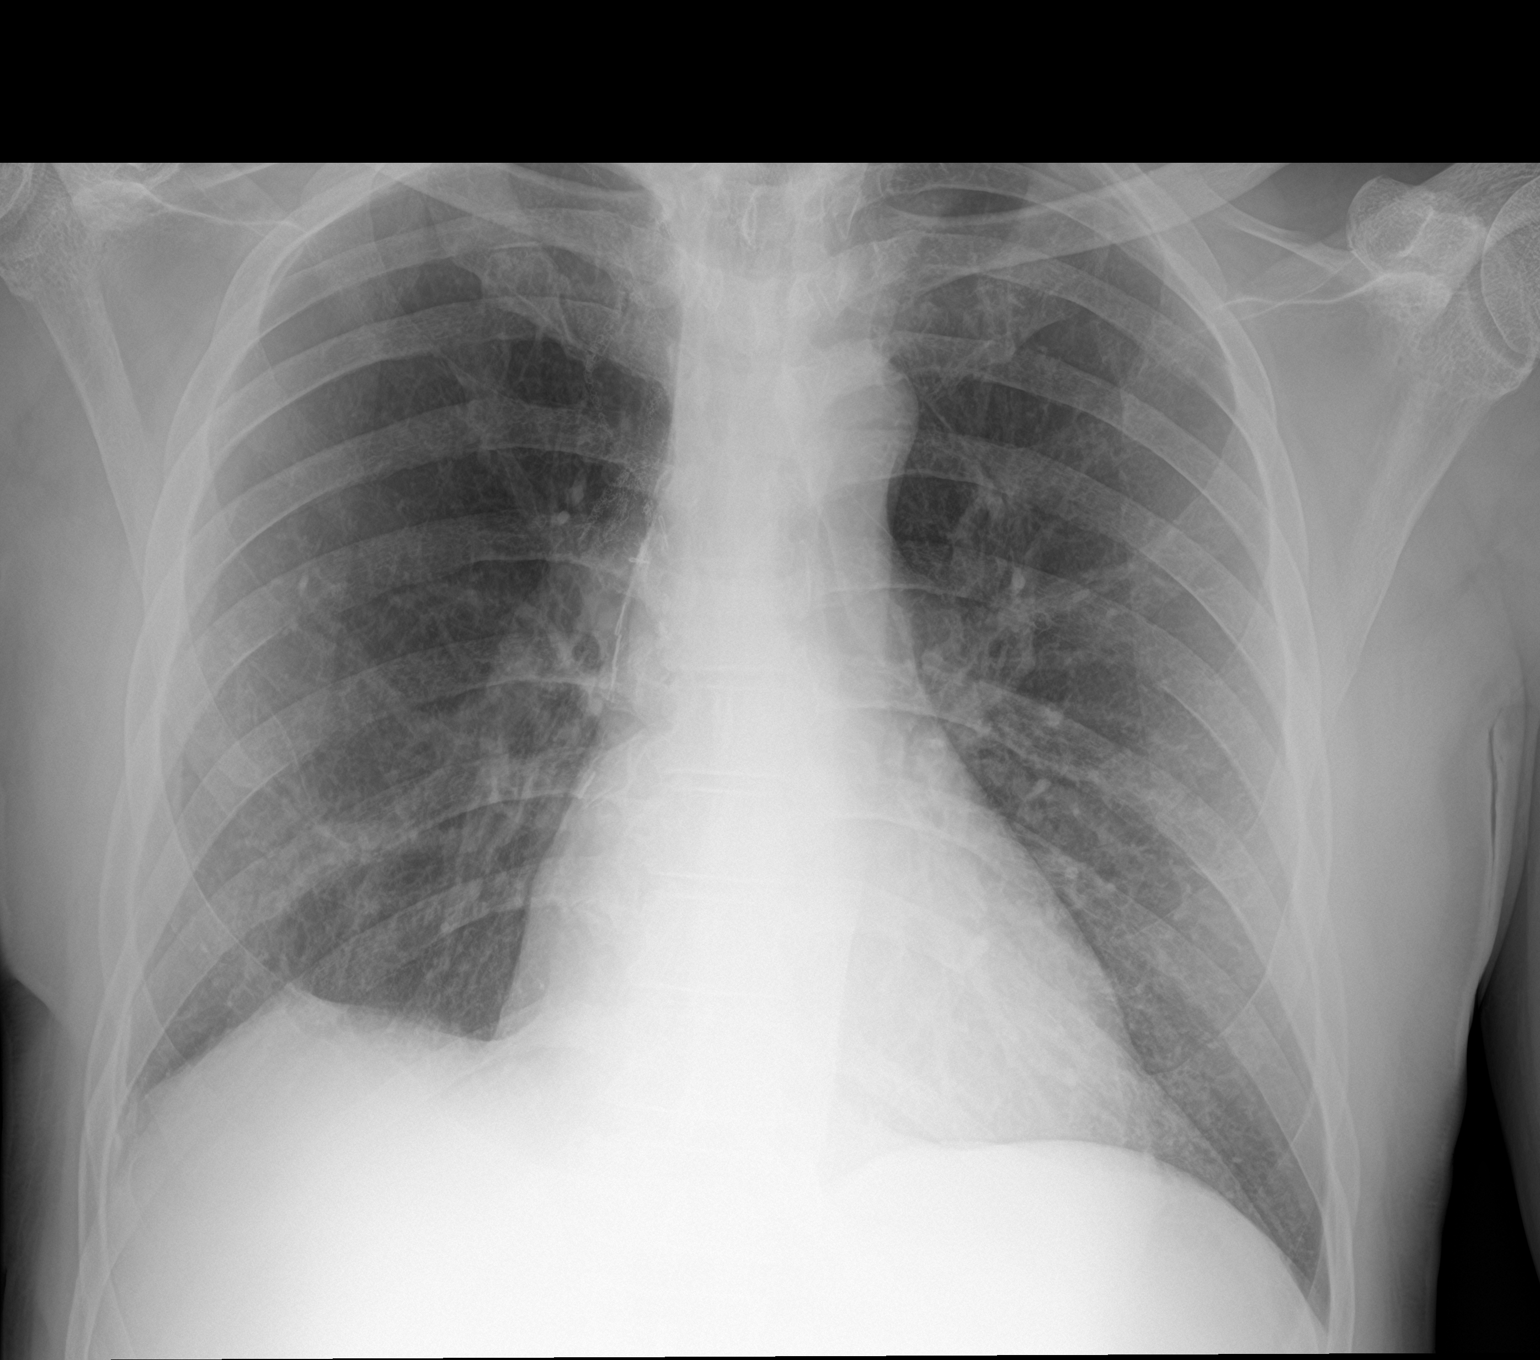

[1 of 1 positions shown; findings below may reference images not displayed]

FINDINGS: Trachea is midline. Cardiomediastinal contours are normal with
distortion of RIGHT hilum secondary to partial lung resection as
before.

Lungs are clear. Peaking of the RIGHT hemidiaphragm related to lung
resection changes no sign of pleural effusion.

On limited assessment skeletal structures without acute process.
IMPRESSION: 1. No acute cardiopulmonary disease.
2. Post RIGHT partial lung resection.

## 2021-08-06 IMAGING — CT CT HEAD W/O CM
3 series · 15 of 47 positions shown, 18 images · non-contrast
Comparison: 08/03/2019

CLINICAL DATA: Found down, left periorbital laceration,
intoxicated, fell

EXAM:
CT HEAD WITHOUT CONTRAST
CT CERVICAL SPINE WITHOUT CONTRAST
TECHNIQUE: Multidetector CT imaging of the head and cervical spine was
performed following the standard protocol without intravenous
contrast. Multiplanar CT image reconstructions of the cervical spine
were also generated.

[Series 3: head 5.0 h30s · axial · 0.51mm/px · z∈[+1492,+1632]mm · 9 of 34 slices shown, 12 images]
[im 3/34  brain]
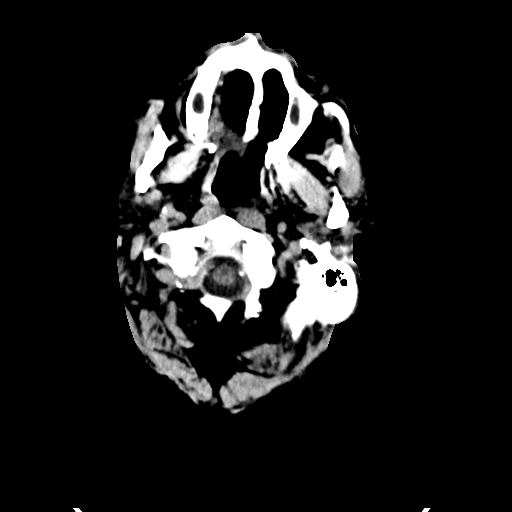
[im 3/34  bone]
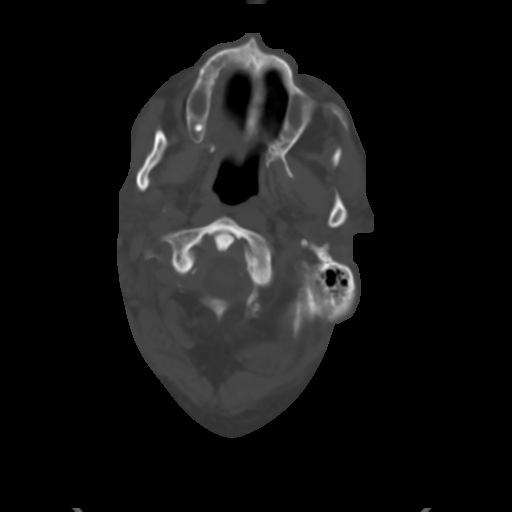
[im 6/34  brain]
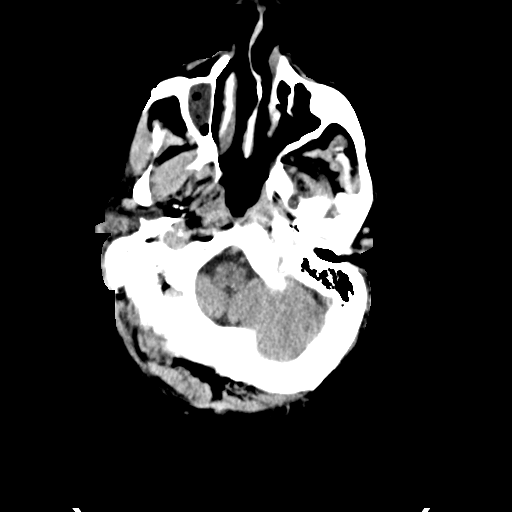
[im 10/34  brain]
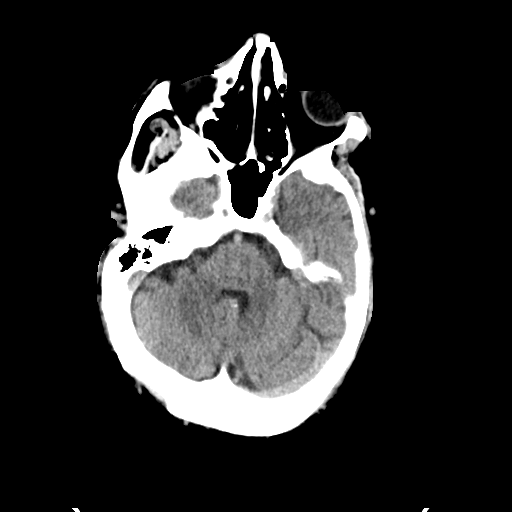
[im 13/34  brain]
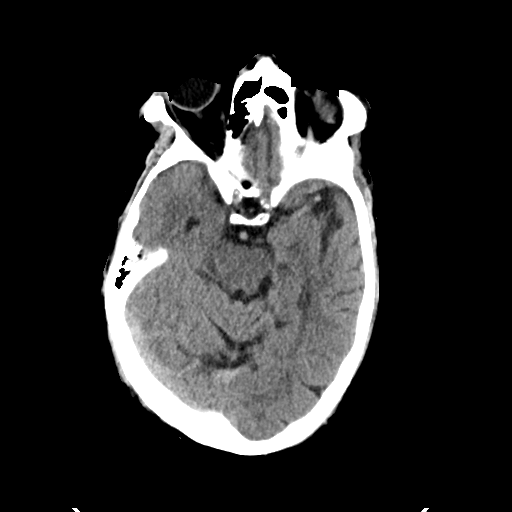
[im 18/34  brain]
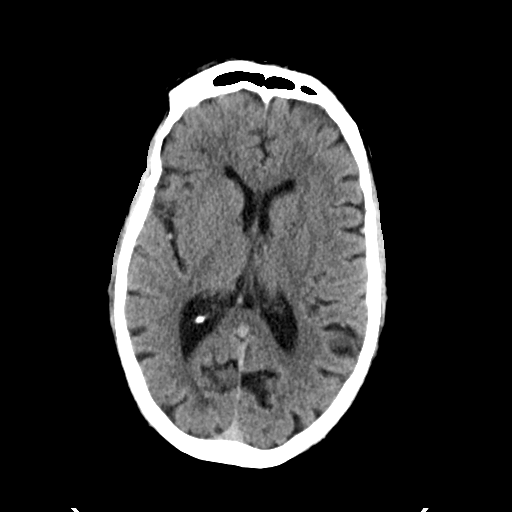
[im 18/34  bone]
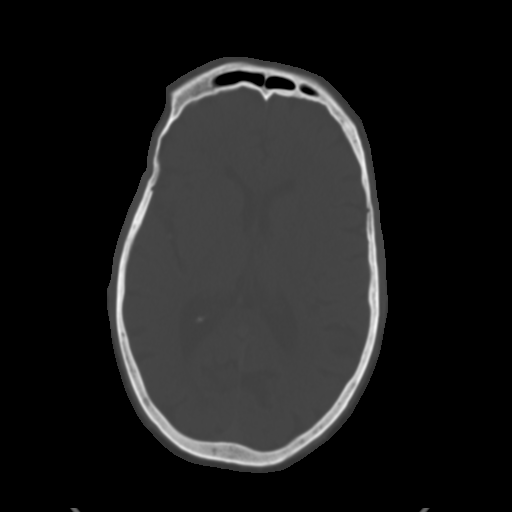
[im 21/34  brain]
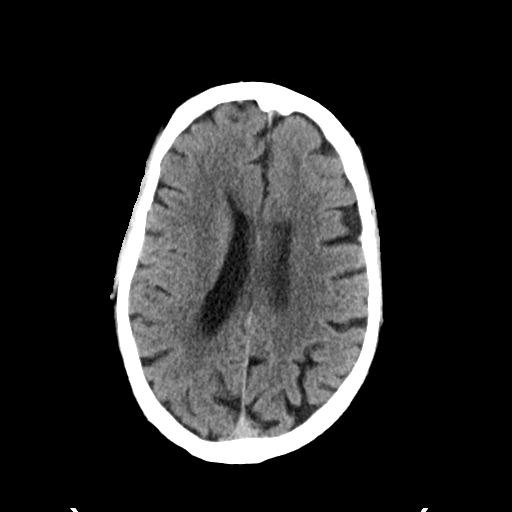
[im 24/34  brain]
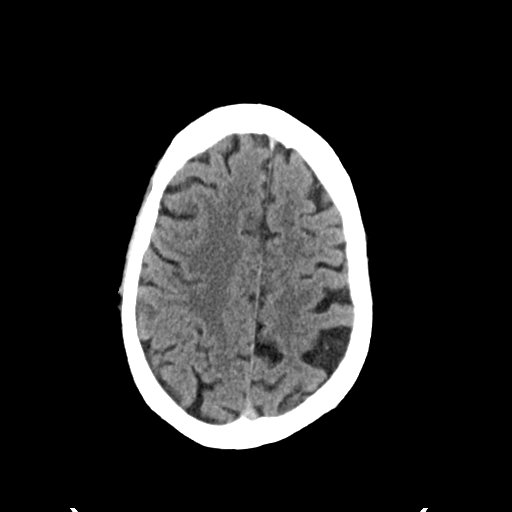
[im 28/34  brain]
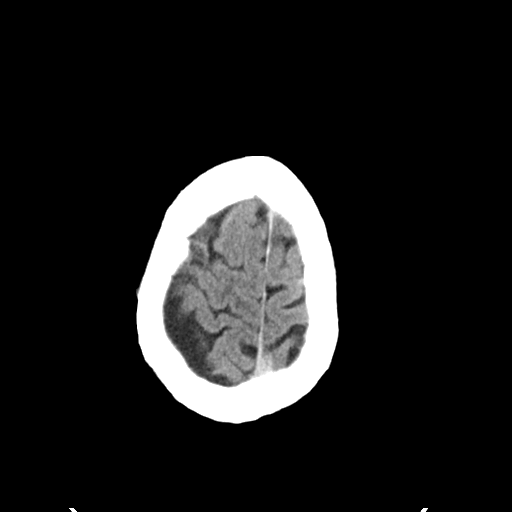
[im 31/34  brain]
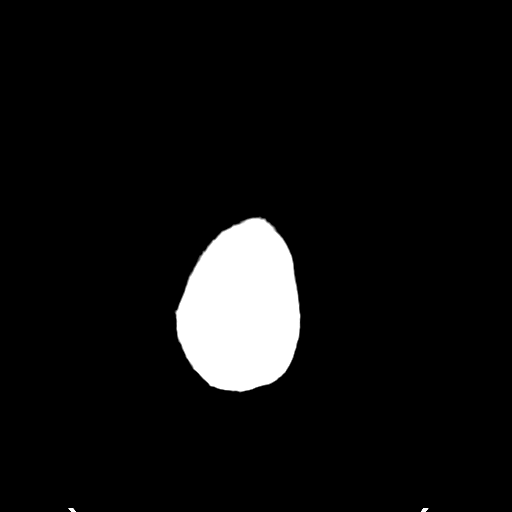
[im 31/34  bone]
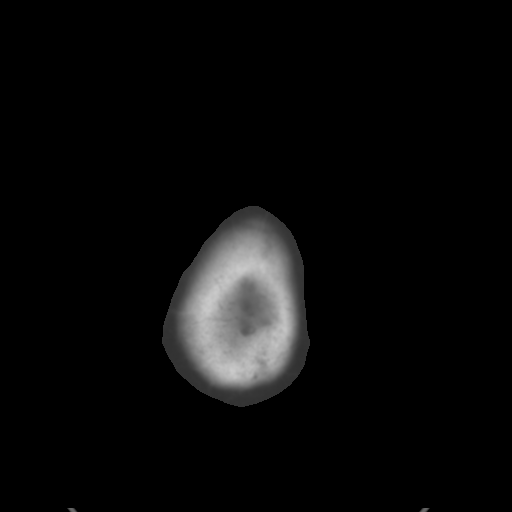

[Series 5: head 3.0 mpr cor · coronal · 0.33mm/px · 3 of 80 slices shown]
[im 27/80  brain]
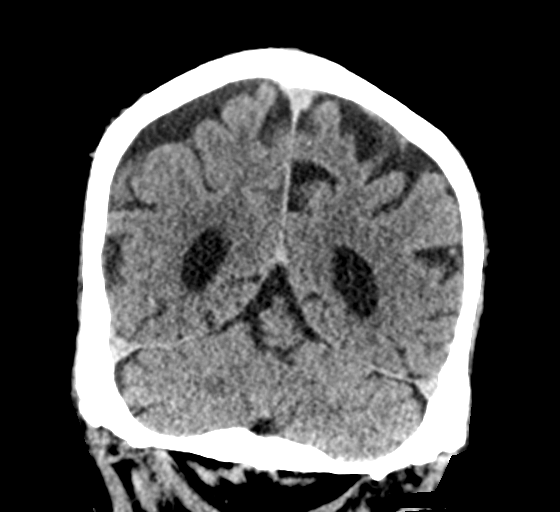
[im 36/80  brain]
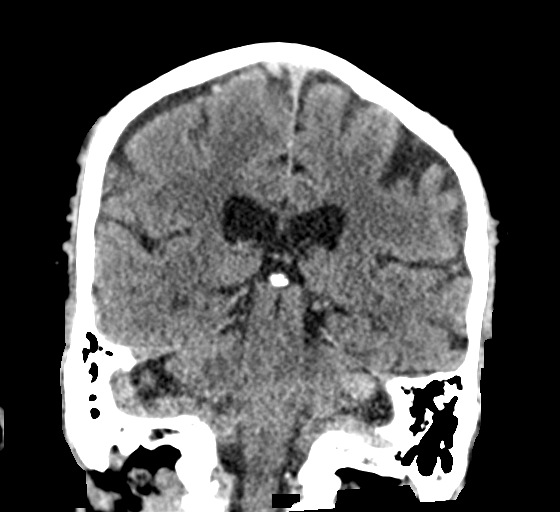
[im 44/80  brain]
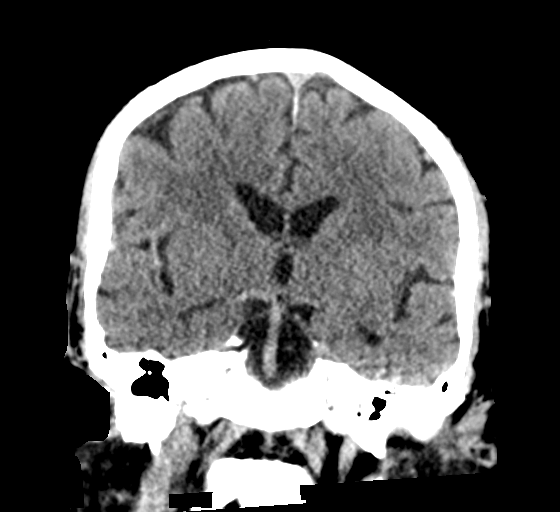

[Series 6: head 3.0 mpr sag · sagittal · 0.35mm/px · 3 of 63 slices shown]
[im 21/63  brain]
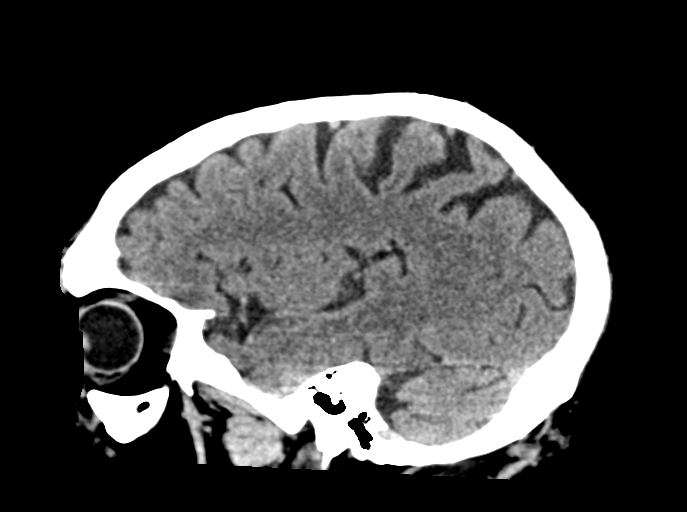
[im 32/63  brain]
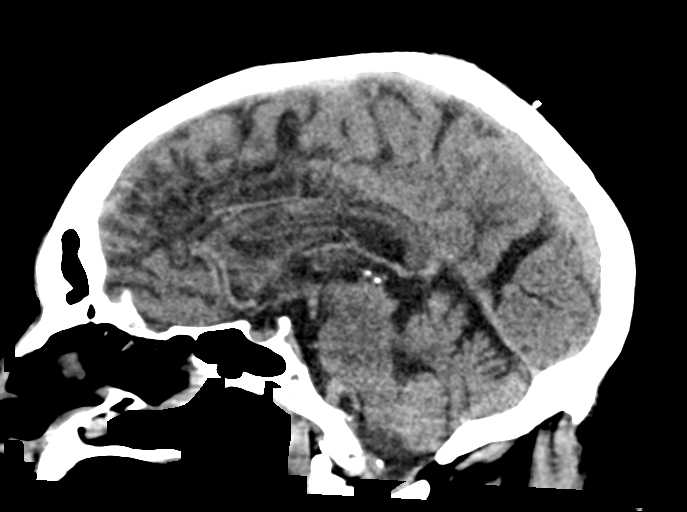
[im 42/63  brain]
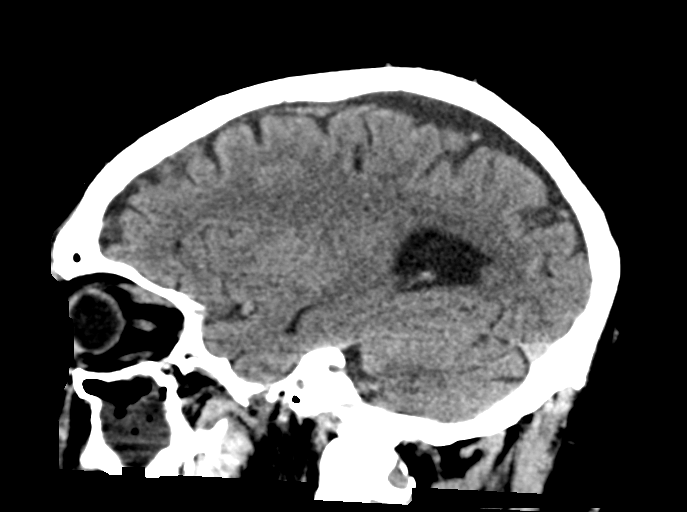

[15 of 47 positions shown; findings below may reference images not displayed]

FINDINGS: CT HEAD FINDINGS

Brain: No acute infarct or hemorrhage. Lateral ventricles and
midline structures are unremarkable. No acute extra-axial fluid
collections. No mass effect.

Vascular: No hyperdense vessel or unexpected calcification.

Skull: Normal. Negative for fracture or focal lesion.

Sinuses/Orbits: There is mucosal thickening within the bilateral
maxillary sinus. Evidence of prior left medial wall antrectomy.

Other: None.

CT CERVICAL SPINE FINDINGS

Alignment: Alignment is anatomic.

Skull base and vertebrae: No acute displaced fracture.

Soft tissues and spinal canal: No prevertebral fluid or swelling. No
visible canal hematoma.

Disc levels:  No significant spondylosis or facet hypertrophy.

Upper chest: Emphysematous changes are seen at the lung apices.
Postsurgical changes right lung.

Other: Reconstructed images demonstrate no additional findings.
IMPRESSION: 1. No acute intracranial process.
2. No acute cervical spine fracture.

## 2021-08-06 IMAGING — DX DG PORTABLE PELVIS
1 series · 1 of 1 positions shown · non-contrast
Comparison: None.

CLINICAL DATA: Fell, intoxicated

EXAM:
PORTABLE PELVIS 1-2 VIEWS

[pelvis ap]
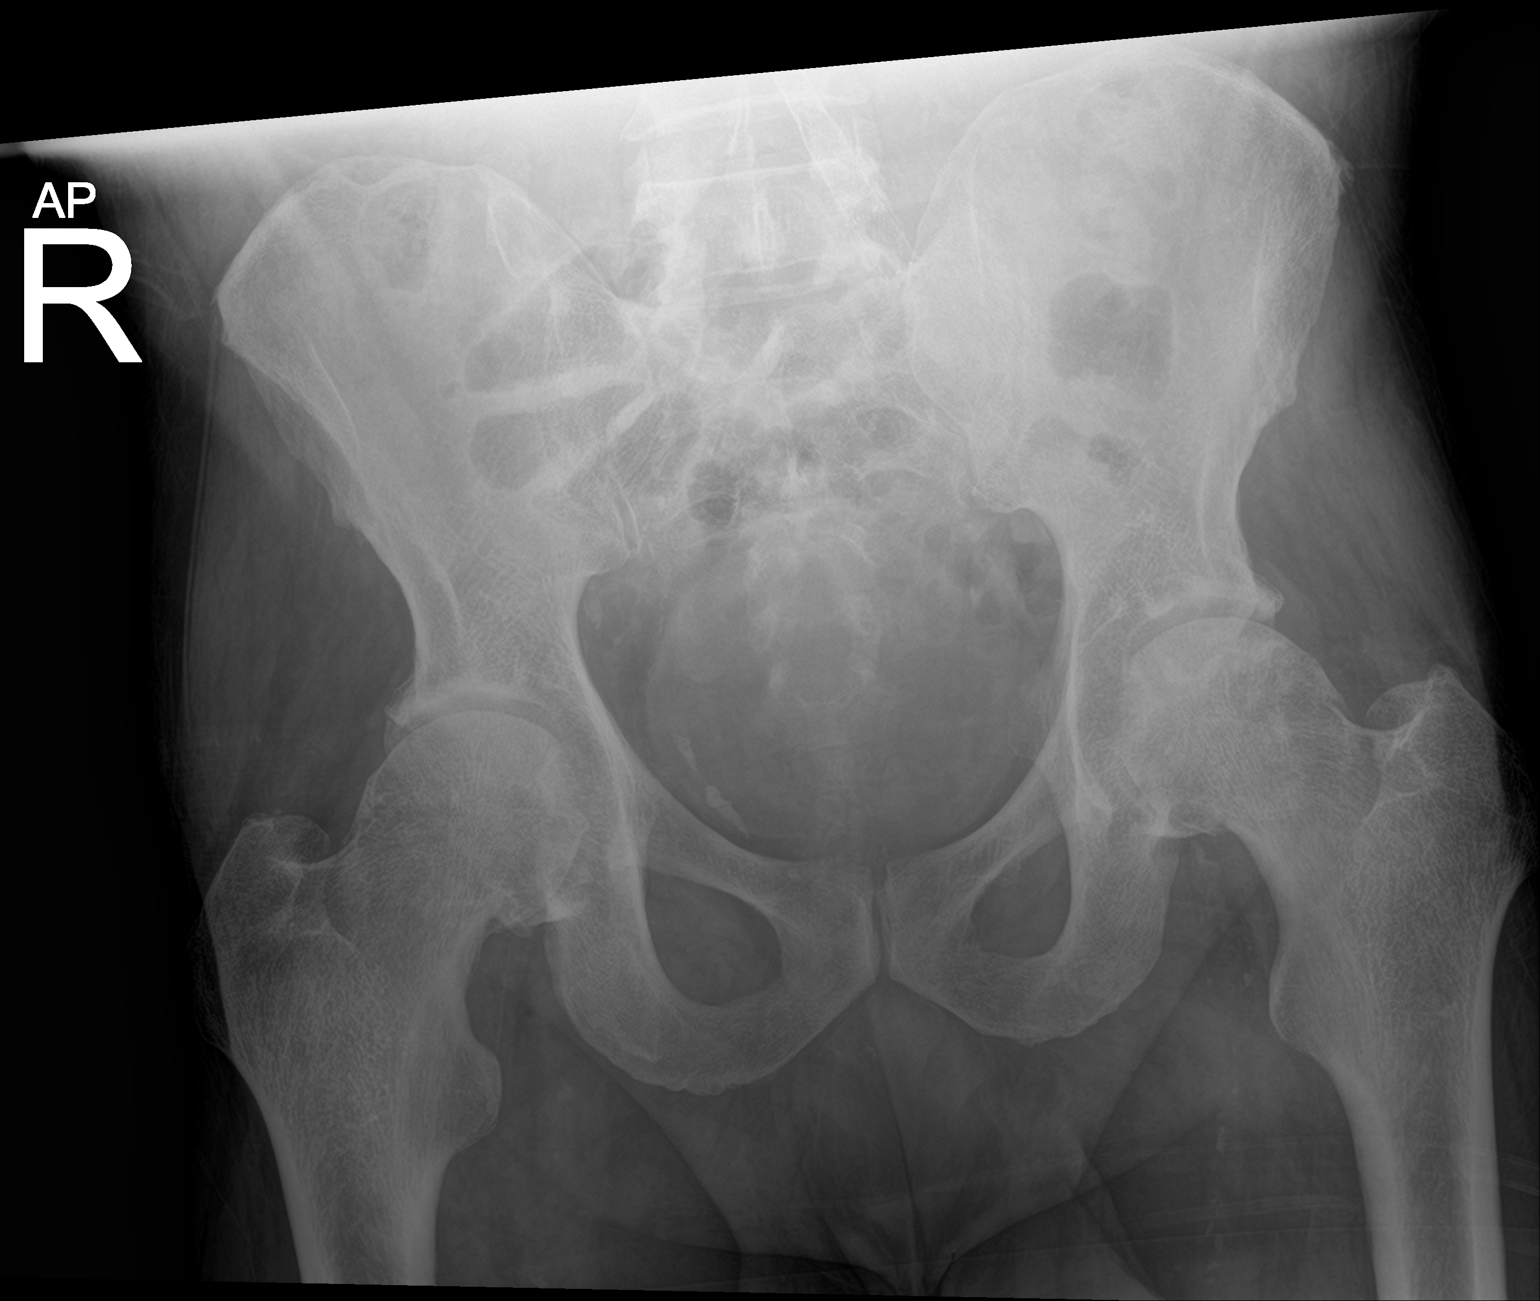

[1 of 1 positions shown; findings below may reference images not displayed]

FINDINGS: Single frontal view of the pelvis demonstrates no fractures.
Symmetrical bilateral hip osteoarthritis. Alignment is anatomic.
Soft tissues are normal.
IMPRESSION: 1. No acute fracture.
2. Symmetrical bilateral hip osteoarthritis.

## 2021-08-06 MED ORDER — HYDROXYZINE HCL 50 MG PO TABS: 50.0000 mg | ORAL_TABLET | Freq: Three times a day (TID) | ORAL | 1 refills | Status: DC | PRN

## 2021-08-06 MED ORDER — BUSPIRONE HCL 30 MG PO TABS: 30.0000 mg | ORAL_TABLET | Freq: Two times a day (BID) | ORAL | 1 refills | Status: DC

## 2021-08-06 MED ORDER — ARIPIPRAZOLE 2 MG PO TABS: 2.0000 mg | ORAL_TABLET | Freq: Every day | ORAL | 1 refills | Status: DC

## 2021-08-06 MED ORDER — SERTRALINE HCL 50 MG PO TABS: 150.0000 mg | ORAL_TABLET | Freq: Every day | ORAL | 1 refills | Status: DC

## 2021-08-06 NOTE — Telephone Encounter (Signed)
Transition Care Management Unsuccessful Follow-up Telephone Call  Date of discharge and from where:  08/04/21 [name]  Attempts:  2nd Attempt  Reason for unsuccessful TCM follow-up call:  Left voice message for pt to contact office for follow up.

## 2021-08-11 ENCOUNTER — Telehealth: Payer: Self-pay | Admitting: *Deleted

## 2021-08-11 ENCOUNTER — Encounter (HOSPITAL_COMMUNITY): Payer: Self-pay | Admitting: Physician Assistant

## 2021-08-11 NOTE — Chronic Care Management (AMB) (Signed)
  Chronic Care Management   Note  08/11/2021 Name: [name]  [name] is a 60 y.o. year old male who is a primary care patient of Ronnie Ramp Arvid Right, [name]. [name] is currently enrolled in care management services. An additional referral for RNCM was placed.   Follow up plan: Telephone appointment with care management team member scheduled for:08/18/21   [facility]  Direct Dial: [phone]

## 2021-08-13 ENCOUNTER — Emergency Department (HOSPITAL_COMMUNITY): Payer: Medicare Other

## 2021-08-13 ENCOUNTER — Encounter (HOSPITAL_COMMUNITY): Payer: Self-pay | Admitting: Emergency Medicine

## 2021-08-13 ENCOUNTER — Emergency Department (HOSPITAL_COMMUNITY)
Admission: EM | Admit: 2021-08-13 | Discharge: 2021-08-14 | Disposition: A | Discharge status: Home / Self Care | Payer: Medicare Other | Attending: Emergency Medicine | Admitting: Emergency Medicine

## 2021-08-13 ENCOUNTER — Other Ambulatory Visit: Payer: Self-pay

## 2021-08-13 DIAGNOSIS — J449 Chronic obstructive pulmonary disease, unspecified: Secondary | ICD-10-CM | POA: Diagnosis not present

## 2021-08-13 DIAGNOSIS — R079 Chest pain, unspecified: Secondary | ICD-10-CM | POA: Insufficient documentation

## 2021-08-13 DIAGNOSIS — F10288 Alcohol dependence with other alcohol-induced disorder: Secondary | ICD-10-CM | POA: Insufficient documentation

## 2021-08-13 DIAGNOSIS — Z5321 Procedure and treatment not carried out due to patient leaving prior to being seen by health care provider: Secondary | ICD-10-CM | POA: Insufficient documentation

## 2021-08-13 DIAGNOSIS — R0602 Shortness of breath: Secondary | ICD-10-CM | POA: Diagnosis not present

## 2021-08-13 NOTE — ED Triage Notes (Signed)
Patient here with chest pain and shortness of breath.  Patient is post covid two weeks.  Patient with history of COPD.  Patient does have ETOH on board at this time.

## 2021-08-14 DIAGNOSIS — R079 Chest pain, unspecified: Secondary | ICD-10-CM | POA: Diagnosis not present

## 2021-08-14 LAB — CBC
HCT: 47.8 % (ref 39.0–52.0)
Hemoglobin: 16.5 g/dL (ref 13.0–17.0)
MCH: 32.5 pg (ref 26.0–34.0)
MCHC: 34.5 g/dL (ref 30.0–36.0)
MCV: 94.3 fL (ref 80.0–100.0)
Platelets: 103 10*3/uL — ABNORMAL LOW (ref 150–400)
RBC: 5.07 MIL/uL (ref 4.22–5.81)
RDW: 13.9 % (ref 11.5–15.5)
WBC: 7.7 10*3/uL (ref 4.0–10.5)
nRBC: 0 % (ref 0.0–0.2)

## 2021-08-14 LAB — TROPONIN I (HIGH SENSITIVITY)
Troponin I (High Sensitivity): 9 ng/L (ref <18)
Troponin I (High Sensitivity): 9 ng/L (ref <18)

## 2021-08-14 LAB — BASIC METABOLIC PANEL
Anion gap: 9 (ref 5–15)
BUN: 7 mg/dL — ABNORMAL LOW (ref 8–23)
CO2: 22 mmol/L (ref 22–32)
Calcium: 9 mg/dL (ref 8.9–10.3)
Chloride: 111 mmol/L (ref 98–111)
Creatinine, Ser: 0.69 mg/dL (ref 0.61–1.24)
GFR, Estimated: >60 mL/min (ref >60)
Glucose, Bld: 121 mg/dL — ABNORMAL HIGH (ref 70–99)
Potassium: 3.8 mmol/L (ref 3.5–5.1)
Sodium: 142 mmol/L (ref 135–145)

## 2021-08-14 NOTE — ED Notes (Addendum)
Patient is leaving due to wait time .tried to talking him in to stay

## 2021-08-17 ENCOUNTER — Telehealth (HOSPITAL_COMMUNITY): Payer: Self-pay | Admitting: Pharmacist

## 2021-08-17 NOTE — Telephone Encounter (Signed)
LVM

## 2021-08-18 ENCOUNTER — Ambulatory Visit (INDEPENDENT_AMBULATORY_CARE_PROVIDER_SITE_OTHER): Payer: Medicare Other | Admitting: *Deleted

## 2021-08-18 DIAGNOSIS — I429 Cardiomyopathy, unspecified: Secondary | ICD-10-CM

## 2021-08-18 DIAGNOSIS — F418 Other specified anxiety disorders: Secondary | ICD-10-CM

## 2021-08-18 DIAGNOSIS — I10 Essential (primary) hypertension: Secondary | ICD-10-CM

## 2021-08-18 NOTE — Patient Instructions (Signed)
Visit Information   [name], thank you for taking time to talk with me today. Please don't hesitate to contact me if I can be of assistance to you before our next scheduled telephone appointment.  Below are the goals we discussed today:  Ronnie Self-Care Activities: Ronnie [name] will: Self administer medications as prescribed Attend all scheduled provider appointments Call pharmacy for medication refills Call provider office for new concerns or questions Continue to check blood pressures at home every day Continue to follow heart healthy, low salt, low cholesterol, carbohydrate-modified, low sugar diet Review attached educational material and consider beginning to check your weight at home several times each week; write your weights down a calendar or piece of paper where you can keep track of them Continue to stay active and walk for exercise and for mood improvement- exercise is very good for your overall state of health  Our next appointment is by telephone on Wednesday, September 29, 2021 at 1:00 pm  Please call the care guide team at [phone] if you need to cancel or reschedule your appointment.   If you are experiencing a Mental Health or [place] or need someone to talk to, Please call the Suicide and Crisis Lifeline: 988 call the [place] National Suicide Prevention Lifeline: 281-223-7088 or TTY: (718)798-9714 TTY (719)138-0231) to talk to a trained counselor call 1-800-273-TALK (toll free, 24 hour hotline) go to [facility] [phone]) call Milo, RN, BSN, [place] [phone]: direct office   Heart Failure Action Plan A heart failure action plan helps you understand what to do when you have symptoms of heart failure. Your action plan is a color-coded plan that lists the symptoms to watch for and indicates what actions  to take. If you have symptoms in the red zone, you need medical care right away. If you have symptoms in the yellow zone, you are having problems. If you have symptoms in the green zone, you are doing well. Follow the plan that was created by you and your health care provider. Review your plan each time you visit your health care provider. Red zone These signs and symptoms mean you should get medical help right away: You have trouble breathing when resting. You have a dry cough that is getting worse. You have swelling or pain in your legs or abdomen that is getting worse. You suddenly gain more than 2-3 lb (0.9-1.4 kg) in 24 hours, or more than 5 lb (2.3 kg) in a week. This amount may be more or less depending on your condition. You have trouble staying awake or you feel confused. You have chest pain. You do not have an appetite. You pass out. You have worsening sadness or depression. If you have any of these symptoms, call your local emergency services (911 in the [place] right away. Do not drive yourself to the hospital. Yellow zone These signs and symptoms mean your condition may be getting worse and you should make some changes: You have trouble breathing when you are active, or you need to sleep with your head raised on extra pillows to help you breathe. You have swelling in your legs or abdomen. You gain 2-3 lb (0.9-1.4 kg) in 24 hours, or 5 lb (2.3 kg) in a week. This amount may be more or less depending on your condition. You get tired easily. You have trouble sleeping. You have a dry  cough. If you have any of these symptoms: Contact your health care provider within the next day. Your health care provider may adjust your medicines. Green zone These signs mean you are doing well and can continue what you are doing: You do not have shortness of breath. You have very little swelling or no new swelling. Your weight is stable (no gain or loss). You have a normal activity level. You  do not have chest pain or any other new symptoms. Follow these instructions at home: Take over-the-counter and prescription medicines only as told by your health care provider. Weigh yourself daily. Your target weight is __________ lb (__________ kg). Call your health care provider if you gain more than __________ lb (__________ kg) in 24 hours, or more than __________ lb (__________ kg) in a week. Health care provider name: _____________________________________________________ Health care provider phone number: _____________________________________________________ Eat a heart-healthy diet. Work with a diet and nutrition specialist (dietitian) to create an eating plan that is best for you. Keep all follow-up visits. This is important. Where to find more information [name]: www.heart.org Summary A heart failure action plan helps you understand what to do when you have symptoms of heart failure. Follow the action plan that was created by you and your health care provider. Get help right away if you have any symptoms in the red zone. This information is not intended to replace advice given to you by your health care provider. Make sure you discuss any questions you have with your health care provider. Document Revised: 04/27/2020 Document Reviewed: 04/27/2020 Elsevier Ronnie Education  2022 [name].  Following is a copy of your full care plan:  Care Plan : RN Care Manager Plan of Care  Updates made by Ronnie Royalty, RN since 08/18/2021 12:00 AM     Problem: Chronic Disease Management Needs   Priority: High     Long-Range Goal: Development of plan of care for long term chronic disease management   Start Date: 08/18/2021  Expected End Date: 08/18/2022  Priority: High  Note:   Current Barriers:  Chronic Disease Management support and education needs related to CHF and HTN Recent hospitalization November 4-9, 2022 for mental health needs, incidental COVID  positive finding; small pulmonary embolism Mental health needs: well established with psychiatric provider  RNCM Clinical Goal(s):  Ronnie will demonstrate ongoing health management independence as evidenced by Ronnie reporting of self-health management independence, adherence to plan of care        through collaboration with RN Care manager, provider, and care team.   Interventions: 1:1 collaboration with primary care provider regarding development and update of comprehensive plan of care as evidenced by provider attestation and co-signature Inter-disciplinary care team collaboration (see longitudinal plan of care) Evaluation of current treatment plan related to  self management and Ronnie's adherence to plan as established by provider Initial assessment completed 08/18/21 Confirmed Ronnie has followed up with established psychiatric provider: he reports "feeling good," depression screen completed today: encouraged his ongoing active participation with established psychiatric provider; he reports action plan for mental health crisis as : "go to ER or call police department"  Heart Failure Interventions:  (Status: New goal.)  Long Term Goal  Basic overview and discussion of pathophysiology of Heart Failure reviewed Assessed need for readable accurate scales in home Provided education about placing scale on hard, flat surface Advised Ronnie to weigh each morning after emptying bladder Discussed importance of daily weight and advised Ronnie to weigh and record daily  Discussed the importance of keeping all appointments with provider Provided Ronnie with education about the role of exercise in the management of heart failure Screening for signs and symptoms of depression related to chronic disease state  Assessed social determinant of health barriers Reviewed recent hospitalization with Ronnie: he reports he went to hospital for cough, general malaise, reports "doing better" after recent  hospitalization Confirmed Ronnie has started taking eliquis post-hospital discharge; he is established/ active with CCM pharmacy team; denies medication concerns today; independently self- manages medications, uses monthly pill box per Ronnie report Discussed his participation with insurance plan care management team: he has a tablet that he monitors blood pressures daily- goes to directly to insurance provider; reports does not obtain daily weights at home, states "never been told to;" reviewed baseline weights at home: he reports general weight ranges between 190-195 lbs; initiated education around rationale/ purpose of daily weight monitoring at home, along with general weight gain guidelines/ action plan for development of weight gain Initiated education around signs/ symptoms CHF yellow zone, along with action plan for development of signs/ symptoms-- will provide additional educational material through My-Chart Confirmed Ronnie follows heart healthy low salt diet, "best as possible;" positive reinforcement provided with encouragement to continue efforts Confirmed Ronnie participates in regular activity/ exercise: reports walks "about every day;" benefits of maintaining mobility/ staying active discussed with Ronnie Noted Ronnie has not scheduled post-hospital discharge office visits with PCP/ cardiologist: encouraged Ronnie to do so- verbalizes understanding/ agreement  Hypertension: (Status: New goal.)  Long Term Goal Last practice recorded BP readings:  BP Readings from Last 3 Encounters:  08/14/21 135/87  08/04/21 126/86  07/07/21 112/80  Most recent eGFR/CrCl:  Lab Results  Component Value Date   EGFR >90 04/13/2016    No components found for: CRCL  Evaluation of current treatment plan related to hypertension self management and Ronnie's adherence to plan as established by provider;   Reviewed prescribed diet heart healthy, low salt, carbohydrate modified/ low sugar Counseled  on the importance of exercise goals with target of 150 minutes per week Discussed plans with Ronnie for ongoing care management follow up and provided Ronnie with direct contact information for care management team; Advised Ronnie, providing education and rationale, to monitor blood pressure daily and record, calling PCP for findings outside established parameters;  Discussed complications of poorly controlled blood pressure such as heart disease, stroke, circulatory complications, vision complications, kidney impairment, sexual dysfunction;  Reviewed recent blood pressures at home: reports "under good control;" reports general ranges between 114-120/ 60-70; denies signs/ symptoms low blood pressure Encouraged Ronnie to continue monitoring/ recording blood pressures at home QD- reports he will do Confirmed Ronnie obtained flu vaccine for 2022-23 flu/ winter season, while in hospital on 08/03/21 Confirmed Ronnie obtained monkey pox vaccines x 2 through health department  Ronnie Goals/Self-Care Activities: As evidenced by review of EHR, collaboration with care team, and Ronnie reporting during CCM RN CM outreach, [name] will: Self administer medications as prescribed Attend all scheduled provider appointments Call pharmacy for medication refills Call provider office for new concerns or questions Continue to check blood pressures at home every day Continue to follow heart healthy, low salt, low cholesterol, carbohydrate-modified, low sugar diet Review attached educational material and consider beginning to check your weight at home several times each week; write your weights down a calendar or piece of paper where you can keep track of them Continue to stay active and walk for exercise and for mood improvement- exercise  is very good for your overall state of health      Consent to CCM Services: [name] was given information about Chronic Care Management services 02/12/21  including:  CCM service includes personalized support from designated clinical staff supervised by his physician, including individualized plan of care and coordination with other care providers 24/7 contact phone numbers for assistance for urgent and routine care needs. Service will only be billed when office clinical staff spend 20 minutes or more in a month to coordinate care. Only one practitioner may furnish and bill the service in a calendar month. The Ronnie may stop CCM services at any time (effective at the end of the month) by phone call to the office staff. The Ronnie will be responsible for cost sharing (co-pay) of up to 20% of the service fee (after annual deductible is met).  Ronnie agreed to services and verbal consent obtained.   Ronnie verbalizes understanding of instructions provided today and agrees to view in MyChart Telephone follow up appointment with care management team member scheduled for:  Wednesday, September 29, 2021 at 1:00 pm The Ronnie has been provided with contact information for the care management team and has been advised to call with any health related questions or concerns  Ronnie Rack, RN, BSN, [place] [phone]: direct office [phone]: mobile

## 2021-08-18 NOTE — Chronic Care Management (AMB) (Signed)
Chronic Care Management   CCM RN Visit Note  08/18/2021 Name: [name] MRN: [id number] DOB: 1960-11-06  Subjective: [name] is a 60 y.o. year old male who is a primary care patient of [name], MD. The care management team was consulted for assistance with disease management and care coordination needs.    Engaged with patient by telephone for initial visit in response to provider referral for case management and/or care coordination services.   Consent to Services:  The patient was given information about Chronic Care Management services, agreed to services, and gave verbal consent 02/12/21 prior to initiation of services.  Please see initial visit note for detailed documentation.  Patient agreed to services and verbal consent obtained.   Assessment: Review of patient past medical history, allergies, medications, health status, including review of consultants reports, laboratory and other test data, was performed as part of comprehensive evaluation and provision of chronic care management services.   SDOH (Social Determinants of Health) assessments and interventions performed:  SDOH Interventions    Flowsheet Row Most Recent Value  SDOH Interventions   Food Insecurity Interventions Intervention Not Indicated  Housing Interventions Intervention Not Indicated  [2 bedroom apartment,  has lived there 2 months]  Transportation Interventions Intervention Not Indicated  [uses medicaid transportation and [place]]  Depression Interventions/Treatment  Medication, Counseling, Currently on Treatment  [Patient reports well established with psychiatry provider- last visit "a week ago"]       CCM Care Plan Allergies  Allergen Reactions   Ace Inhibitors Cough   Aspirin Other (See Comments)    Reaction:  Nose bleeds and GI bleeding    Clopidogrel Bisulfate Other (See Comments)    Reaction:  Nose bleeds    Crestor [Rosuvastatin Calcium] Other (See Comments)    Reaction:  Leg  cramps    Metformin And Related Diarrhea   Rosuvastatin Cough   Outpatient Encounter Medications as of 08/18/2021  Medication Sig   acetaminophen (TYLENOL) 500 MG tablet Take 1,000 mg by mouth 2 (two) times daily as needed for moderate pain.   albuterol (PROAIR HFA) 108 (90 Base) MCG/ACT inhaler Inhale 2 puffs into the lungs every 4 (four) hours as needed for wheezing.   albuterol (PROVENTIL) (2.5 MG/3ML) 0.083% nebulizer solution Take 3 mLs (2.5 mg total) by nebulization every 6 (six) hours as needed for wheezing or shortness of breath.   ANORO ELLIPTA 62.5-25 MCG/ACT AEPB Inhale 1 puff into the lungs daily.   apixaban (ELIQUIS) 5 MG TABS tablet Take 2 tablets (10 mg total) by mouth 2 (two) times daily for 2 days.   apixaban (ELIQUIS) 5 MG TABS tablet Take 2 tablets (10 mg total) by mouth 2 (two) times daily for 2 days then take 1 tablet (5 mg total) by mouth 2 (two) times daily.   ARIPiprazole (ABILIFY) 2 MG tablet Take 1 tablet (2 mg total) by mouth daily.   busPIRone (BUSPAR) 30 MG tablet Take 1 tablet (30 mg total) by mouth 2 (two) times daily.   carvedilol (COREG) 6.25 MG tablet Take 1 tablet (6.25 mg total) by mouth 2 (two) times daily with a meal.   cetirizine (ZYRTEC) 10 MG tablet Take 1 tablet (10 mg total) by mouth daily.   DESCOVY 200-25 MG tablet Take 1 tablet by mouth daily.   fluticasone (FLONASE) 50 MCG/ACT nasal spray Place 2 sprays into both nostrils daily.   folic acid (FOLVITE) 1 MG tablet Take 1 tablet (1 mg total) by mouth daily.  hydrOXYzine (ATARAX/VISTARIL) 50 MG tablet Take 1 tablet (50 mg total) by mouth 3 (three) times daily as needed for anxiety.   Multiple Vitamin (MULTIVITAMIN WITH MINERALS) TABS tablet Take 1 tablet by mouth daily.   pantoprazole (PROTONIX) 40 MG tablet Take 1 tablet (40 mg total) by mouth daily.   pravastatin (PRAVACHOL) 40 MG tablet Take 1 tablet (40 mg total) by mouth daily.   sacubitril-valsartan (ENTRESTO) 24-26 MG Take 1 tablet by mouth 2  (two) times daily.   sertraline (ZOLOFT) 50 MG tablet Take 3 tablets (150 mg total) by mouth daily.   thiamine 100 MG tablet Take 0.5 tablets (50 mg total) by mouth daily.   traZODone (DESYREL) 100 MG tablet Take 3 tablets (300 mg total) by mouth at bedtime. (Patient taking differently: Take 100 mg by mouth at bedtime.)   No facility-administered encounter medications on file as of 08/18/2021.   Patient Active Problem List   Diagnosis Date Noted   Severe episode of recurrent major depressive disorder, without psychotic features ([place]) 07/31/2021   Pulmonary embolism ([place]) 07/30/2021   Alcohol abuse with intoxication ([place]) 07/30/2021   Suicidal ideation 07/30/2021   Long toenail 01/11/2021   Aortic atherosclerosis (HCC)    Generalized anxiety disorder 11/18/2020   Moderate episode of recurrent major depressive disorder ([place]) 11/18/2020   Panic disorder 11/18/2020   Schizophrenia ([place]) 05/18/2020   Thiamine deficiency 12/10/2019   Screening-pulmonary TB 12/04/2019   Irritable bowel syndrome with diarrhea 07/01/2019   Type II diabetes mellitus with manifestations ([place]) 06/26/2019   De Quervain's tenosynovitis, left 04/11/2018   Sleep apnea, primary central 11/30/2016   Insomnia w/ sleep apnea 11/30/2016   Syphili, latent 03/05/2016   Glaucoma suspect of both eyes 11/19/2015   Non-small cell carcinoma of lung, stage 1 ([place]) 10/12/2015   COPD GOLD II if use fev1/VC and still smoking  08/11/2015   High risk homosexual behavior 07/09/2014   PUD (peptic ulcer disease) 01/09/2013   Routine general medical examination at a health care facility 06/04/2012   Paranoid schizophrenia ([place]) 08/01/2011   DJD (degenerative joint disease) of knee 03/15/2011   Obstructive sleep apnea 11/29/2010   ERECTILE DYSFUNCTION, 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/2010   Allergic rhinitis 03/17/2010   Smoking 12/14/2009   HYPERTENSION, BENIGN 10/05/2009   Nonischemic cardiomyopathy ([place]) 10/05/2009   PAD (peripheral artery  disease) ([place]) 09/15/2009   Hx of adenomatous colonic polyps 12/03/2008   Hyperlipidemia with target LDL less than 130 06/23/2008   BPH associated with nocturia 06/23/2008   Conditions to be addressed/monitored:  CHF and HTN  Care Plan : RN Care Manager Plan of Care  Updates made by Knox Royalty, RN since 08/18/2021 12:00 AM     Problem: Chronic Disease Management Needs   Priority: High     Long-Range Goal: Development of plan of care for long term chronic disease management   Start Date: 08/18/2021  Expected End Date: 08/18/2022  Priority: High  Note:   Current Barriers:  Chronic Disease Management support and education needs related to CHF and HTN Recent hospitalization November 4-9, 2022 for mental health needs, incidental COVID positive finding; small pulmonary embolism Mental health needs: well established with psychiatric provider  RNCM Clinical Goal(s):  Patient will demonstrate ongoing health management independence as evidenced by patient reporting of self-health management independence, adherence to plan of care        through collaboration with RN Care manager, provider, and care team.   Interventions: 1:1 collaboration with primary care provider regarding development  and update of comprehensive plan of care as evidenced by provider attestation and co-signature Inter-disciplinary care team collaboration (see longitudinal plan of care) Evaluation of current treatment plan related to  self management and patient's adherence to plan as established by provider Initial assessment completed 08/18/21 Confirmed patient has followed up with established psychiatric provider: he reports "feeling good," depression screen completed today: encouraged his ongoing active participation with established psychiatric provider; he reports action plan for mental health crisis as : "go to ER or call police department"  Heart Failure Interventions:  (Status: New goal.)  Long Term Goal   Basic overview and discussion of pathophysiology of Heart Failure reviewed Assessed need for readable accurate scales in home Provided education about placing scale on hard, flat surface Advised patient to weigh each morning after emptying bladder Discussed importance of daily weight and advised patient to weigh and record daily Discussed the importance of keeping all appointments with provider Provided patient with education about the role of exercise in the management of heart failure Screening for signs and symptoms of depression related to chronic disease state  Assessed social determinant of health barriers Reviewed recent hospitalization with patient: he reports he went to hospital for cough, general malaise, reports "doing better" after recent hospitalization Confirmed patient has started taking eliquis post-hospital discharge; he is established/ active with CCM pharmacy team; denies medication concerns today; independently self- manages medications, uses monthly pill box per patient report Discussed his participation with insurance plan care management team: he has a tablet that he monitors blood pressures daily- goes to directly to insurance provider; reports does not obtain daily weights at home, states "never been told to;" reviewed baseline weights at home: he reports general weight ranges between 190-195 lbs; initiated education around rationale/ purpose of daily weight monitoring at home, along with general weight gain guidelines/ action plan for development of weight gain Initiated education around signs/ symptoms CHF yellow zone, along with action plan for development of signs/ symptoms-- will provide additional educational material through My-Chart Confirmed patient follows heart healthy low salt diet, "best as possible;" positive reinforcement provided with encouragement to continue efforts Confirmed patient participates in regular activity/ exercise: reports walks "about every  day;" benefits of maintaining mobility/ staying active discussed with patient Noted patient has not scheduled post-hospital discharge office visits with PCP/ cardiologist: encouraged patient to do so- verbalizes understanding/ agreement  Hypertension: (Status: New goal.)  Long Term Goal Last practice recorded BP readings:  BP Readings from Last 3 Encounters:  08/14/21 135/87  08/04/21 126/86  07/07/21 112/80  Most recent eGFR/CrCl:  Lab Results  Component Value Date   EGFR >90 04/13/2016    No components found for: CRCL  Evaluation of current treatment plan related to hypertension self management and patient's adherence to plan as established by provider;   Reviewed prescribed diet heart healthy, low salt, carbohydrate modified/ low sugar Counseled on the importance of exercise goals with target of 150 minutes per week Discussed plans with patient for ongoing care management follow up and provided patient with direct contact information for care management team; Advised patient, providing education and rationale, to monitor blood pressure daily and record, calling PCP for findings outside established parameters;  Discussed complications of poorly controlled blood pressure such as heart disease, stroke, circulatory complications, vision complications, kidney impairment, sexual dysfunction;  Reviewed recent blood pressures at home: reports "under good control;" reports general ranges between 114-120/ 60-70; denies signs/ symptoms low blood pressure Encouraged patient to continue monitoring/ recording blood  pressures at home QD- reports he will do Confirmed patient obtained flu vaccine for 2022-23 flu/ winter season, while in hospital on 08/03/21 Confirmed patient obtained monkey pox vaccines x 2 through health department  Patient Goals/Self-Care Activities: As evidenced by review of EHR, collaboration with care team, and patient reporting during CCM RN CM outreach, Patient Harvey will: Self  administer medications as prescribed Attend all scheduled provider appointments Call pharmacy for medication refills Call provider office for new concerns or questions Continue to check blood pressures at home every day Continue to follow heart healthy, low salt, low cholesterol, carbohydrate-modified, low sugar diet Review attached educational material and consider beginning to check your weight at home several times each week; write your weights down a calendar or piece of paper where you can keep track of them Continue to stay active and walk for exercise and for mood improvement- exercise is very good for your overall state of health      Plan: Telephone follow up appointment with care management team member scheduled for:  Wednesday, September 29, 2020 at 1:00 pm The patient has been provided with contact information for the care management team and has been advised to call with any health related questions or concerns  Oneta Rack, RN, BSN, [place] [phone]: direct office [phone]: mobile

## 2021-08-23 ENCOUNTER — Other Ambulatory Visit (HOSPITAL_COMMUNITY): Payer: Self-pay

## 2021-08-23 ENCOUNTER — Telehealth (HOSPITAL_COMMUNITY): Payer: Self-pay

## 2021-08-23 ENCOUNTER — Ambulatory Visit: Payer: Medicare Other | Admitting: Physician Assistant

## 2021-08-23 NOTE — Telephone Encounter (Signed)
Attempted to call [name] for a follow up conversation.
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
## 2021-08-25 DIAGNOSIS — F418 Other specified anxiety disorders: Secondary | ICD-10-CM | POA: Diagnosis not present

## 2021-08-25 DIAGNOSIS — I10 Essential (primary) hypertension: Secondary | ICD-10-CM

## 2021-09-02 ENCOUNTER — Other Ambulatory Visit (HOSPITAL_COMMUNITY): Payer: Self-pay | Admitting: Physician Assistant

## 2021-09-02 DIAGNOSIS — F332 Major depressive disorder, recurrent severe without psychotic features: Secondary | ICD-10-CM

## 2021-09-14 ENCOUNTER — Other Ambulatory Visit: Payer: Self-pay | Admitting: Internal Medicine

## 2021-09-14 DIAGNOSIS — J449 Chronic obstructive pulmonary disease, unspecified: Secondary | ICD-10-CM

## 2021-09-14 DIAGNOSIS — J301 Allergic rhinitis due to pollen: Secondary | ICD-10-CM

## 2021-09-23 ENCOUNTER — Ambulatory Visit: Payer: Medicare Other | Admitting: Internal Medicine

## 2021-09-24 ENCOUNTER — Encounter (HOSPITAL_COMMUNITY): Payer: Self-pay | Admitting: Physician Assistant

## 2021-09-24 ENCOUNTER — Telehealth (INDEPENDENT_AMBULATORY_CARE_PROVIDER_SITE_OTHER): Payer: Medicare Other | Admitting: Physician Assistant

## 2021-09-24 DIAGNOSIS — F411 Generalized anxiety disorder: Secondary | ICD-10-CM | POA: Diagnosis not present

## 2021-09-24 DIAGNOSIS — F332 Major depressive disorder, recurrent severe without psychotic features: Secondary | ICD-10-CM | POA: Diagnosis not present

## 2021-09-24 DIAGNOSIS — F410 Panic disorder [episodic paroxysmal anxiety] without agoraphobia: Secondary | ICD-10-CM

## 2021-09-24 MED ORDER — HYDROXYZINE HCL 50 MG PO TABS: 50.0000 mg | ORAL_TABLET | Freq: Three times a day (TID) | ORAL | 1 refills | Status: DC | PRN

## 2021-09-24 MED ORDER — SERTRALINE HCL 100 MG PO TABS: 200.0000 mg | ORAL_TABLET | Freq: Every day | ORAL | 1 refills | Status: DC

## 2021-09-24 MED ORDER — ARIPIPRAZOLE 5 MG PO TABS: 5.0000 mg | ORAL_TABLET | Freq: Every day | ORAL | 1 refills | Status: DC

## 2021-09-24 MED ORDER — BUSPIRONE HCL 30 MG PO TABS: 30.0000 mg | ORAL_TABLET | Freq: Two times a day (BID) | ORAL | 1 refills | Status: DC
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
## 2021-09-29 ENCOUNTER — Ambulatory Visit (INDEPENDENT_AMBULATORY_CARE_PROVIDER_SITE_OTHER): Payer: Medicare Other | Admitting: *Deleted

## 2021-09-29 DIAGNOSIS — I429 Cardiomyopathy, unspecified: Secondary | ICD-10-CM

## 2021-09-29 DIAGNOSIS — F411 Generalized anxiety disorder: Secondary | ICD-10-CM

## 2021-09-29 DIAGNOSIS — I10 Essential (primary) hypertension: Secondary | ICD-10-CM

## 2021-09-29 NOTE — Chronic Care Management (AMB) (Signed)
Chronic Care Management   CCM RN Visit Note  09/29/2021 Name: [name] MRN: [id number] DOB: 05-15-1961  Subjective: [name] is a 61 y.o. year old male who is a primary care Ronnie of [name], MD. The care management team was consulted for assistance with disease management and care coordination needs.    Engaged with Ronnie by telephone for follow up visit in response to provider referral for case management and/or care coordination services.   Consent to Services:  The Ronnie was given information about Chronic Care Management services, agreed to services, and gave verbal consent prior to initiation of services.  Please see initial visit note for detailed documentation.  Ronnie agreed to services and verbal consent obtained.   Assessment: Review of Ronnie past medical history, allergies, medications, health status, including review of consultants reports, laboratory and other test data, was performed as part of comprehensive evaluation and provision of chronic care management services.  CCM Care Plan Allergies  Allergen Reactions   Ace Inhibitors Cough   Aspirin Other (See Comments)    Reaction:  Nose bleeds and GI bleeding    Clopidogrel Bisulfate Other (See Comments)    Reaction:  Nose bleeds    Crestor [Rosuvastatin Calcium] Other (See Comments)    Reaction:  Leg cramps    Metformin And Related Diarrhea   Rosuvastatin Cough   Outpatient Encounter Medications as of 09/29/2021  Medication Sig   acetaminophen (TYLENOL) 500 MG tablet Take 1,000 mg by mouth 2 (two) times daily as needed for moderate pain.   albuterol (PROVENTIL) (2.5 MG/3ML) 0.083% nebulizer solution Take 3 mLs (2.5 mg total) by nebulization every 6 (six) hours as needed for wheezing or shortness of breath.   albuterol (VENTOLIN HFA) 108 (90 Base) MCG/ACT inhaler Inhale 2 puffs into the lungs every 4 (four) hours as needed for wheezing.   ANORO ELLIPTA 62.5-25 MCG/ACT AEPB Inhale 1 puff into  the lungs daily.   apixaban (ELIQUIS) 5 MG TABS tablet Take 2 tablets (10 mg total) by mouth 2 (two) times daily for 2 days.   apixaban (ELIQUIS) 5 MG TABS tablet Take 2 tablets (10 mg total) by mouth 2 (two) times daily for 2 days then take 1 tablet (5 mg total) by mouth 2 (two) times daily.   ARIPiprazole (ABILIFY) 5 MG tablet Take 1 tablet (5 mg total) by mouth daily.   busPIRone (BUSPAR) 30 MG tablet Take 1 tablet (30 mg total) by mouth 2 (two) times daily.   carvedilol (COREG) 6.25 MG tablet Take 1 tablet (6.25 mg total) by mouth 2 (two) times daily with a meal.   cetirizine (ZYRTEC) 10 MG tablet Take 1 tablet (10 mg total) by mouth daily.   DESCOVY 200-25 MG tablet Take 1 tablet by mouth daily.   fluticasone (FLONASE) 50 MCG/ACT nasal spray Place 2 sprays into both nostrils daily.   folic acid (FOLVITE) 1 MG tablet Take 1 tablet (1 mg total) by mouth daily.   hydrOXYzine (ATARAX) 50 MG tablet Take 1 tablet (50 mg total) by mouth 3 (three) times daily as needed for anxiety.   Multiple Vitamin (MULTIVITAMIN WITH MINERALS) TABS tablet Take 1 tablet by mouth daily.   pantoprazole (PROTONIX) 40 MG tablet Take 1 tablet (40 mg total) by mouth daily.   pravastatin (PRAVACHOL) 40 MG tablet Take 1 tablet (40 mg total) by mouth daily.   sacubitril-valsartan (ENTRESTO) 24-26 MG Take 1 tablet by mouth 2 (two) times daily.   sertraline (  ZOLOFT) 100 MG tablet Take 2 tablets (200 mg total) by mouth daily.   thiamine 100 MG tablet Take 0.5 tablets (50 mg total) by mouth daily.   traZODone (DESYREL) 100 MG tablet Take 3 tablets (300 mg total) by mouth at bedtime. (Ronnie taking differently: Take 100 mg by mouth at bedtime.)   No facility-administered encounter medications on file as of 09/29/2021.   Ronnie Active Problem List   Diagnosis Date Noted   Severe episode of recurrent major depressive disorder, without psychotic features ([place]) 07/31/2021   Pulmonary embolism ([place]) 07/30/2021   Alcohol abuse  with intoxication ([place]) 07/30/2021   Suicidal ideation 07/30/2021   Long toenail 01/11/2021   Aortic atherosclerosis ([facility])    Generalized anxiety disorder 11/18/2020   Moderate episode of recurrent major depressive disorder ([place]) 11/18/2020   Panic disorder 11/18/2020   Schizophrenia ([place]) 05/18/2020   Thiamine deficiency 12/10/2019   Screening-pulmonary TB 12/04/2019   Irritable bowel syndrome with diarrhea 07/01/2019   Type II diabetes mellitus with manifestations ([place]) 06/26/2019   De Quervain's tenosynovitis, left 04/11/2018   Sleep apnea, primary central 11/30/2016   Insomnia w/ sleep apnea 11/30/2016   Syphili, latent 03/05/2016   Glaucoma suspect of both eyes 11/19/2015   Non-small cell carcinoma of lung, stage 1 ([place]) 10/12/2015   COPD GOLD II if use fev1/VC and still smoking  08/11/2015   High risk homosexual behavior 07/09/2014   PUD (peptic ulcer disease) 01/09/2013   Routine general medical examination at a health care facility 06/04/2012   Paranoid schizophrenia ([place]) 08/01/2011   DJD (degenerative joint disease) of knee 03/15/2011   Obstructive sleep apnea 11/29/2010   ERECTILE DYSFUNCTION, 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/2010   Allergic rhinitis 03/17/2010   Smoking 12/14/2009   HYPERTENSION, BENIGN 10/05/2009   Nonischemic cardiomyopathy ([place]) 10/05/2009   PAD (peripheral artery disease) ([place]) 09/15/2009   Hx of adenomatous colonic polyps 12/03/2008   Hyperlipidemia with target LDL less than 130 06/23/2008   BPH associated with nocturia 06/23/2008   Conditions to be addressed/monitored:  depression/ anxiety; CHF and HTN  Care Plan : RN Care Manager Plan of Care  Updates made by Knox Royalty, RN since 09/29/2021 12:00 AM     Problem: Chronic Disease Management Needs   Priority: High     Long-Range Goal: Development of plan of care for long term chronic disease management   Start Date: 08/18/2021  Expected End Date: 08/18/2022  Priority: High  Note:   Current Barriers:   Chronic Disease Management support and education needs related to CHF and HTN Recent hospitalization November 4-9, 2022 for mental health needs, incidental COVID positive finding; small pulmonary embolism Mental health needs: well established with psychiatric provider for counseling services; klast video visit: 09/24/21 09/29/21- reports intermittent non-adherence to medications: CCM Pharmacy team involved/ updated  RNCM Clinical Goal(s):  Ronnie will demonstrate ongoing health management independence as evidenced by Ronnie reporting of self-health management independence, adherence to plan of care        through collaboration with RN Care manager, provider, and care team.   Interventions: 1:1 collaboration with primary care provider regarding development and update of comprehensive plan of care as evidenced by provider attestation and co-signature Inter-disciplinary care team collaboration (see longitudinal plan of care) Evaluation of current treatment plan related to  self management and Ronnie's adherence to plan as established by provider Initial assessment completed 08/18/21 Confirmed Ronnie has ongoing counseling with established psychiatric provider: today, he reports he is "very mad" at his family; states  over and over during our conversation that "they want my money;" he confirms that he has continued regular follow up with his established provide: reports last visit 09/24/22; unfortunately, today, he is having difficulty focusing on anything other than his stated anger at his family members, and does not seem to wish to discuss anything else-- significant time spent in attempting to focus/ divert Ronnie away from his stated immediate anger at his family members: encouraged his ongoing follow up with counseling provider to manage anxiety/ anger issues- he is agreeable  Heart Failure Interventions:  (Status: Goal on Track (progressing): YES.)  Long Term Goal  Discussed importance of  daily weight and advised Ronnie to weigh and record daily Discussed the importance of keeping all appointments with provider Provided Ronnie with education about the role of exercise in the management of heart failure Reviewed recent missed appointments with PCP and cardiology providers-- Ronnie offers no reason for no-show's of his visits-- he just states that "it was all my sister's fault;" he was encouraged to re-schedule these visits promptly; he states he "will do" Confirms he has intermittently monitored his weights at home: reports consistent recent weights at "188 lbs" Denies clinical concerns around breathing, lower extremity swelling Denies medication concerns but tells me he has not been consistently taking all of his medications-- again, due to his "anger" at his family members; I attempted to obtain details around which medications he reports not taking: he does not wish to discuss in detail, states that he "just sometimes doesn't feel like" taking medications; education provided around importance of medication adherence and why it is important to take medications as prescribed; he tells me he will resume taking medications as prescribed "soon," and declines specific conversation about this today; I explained to Ronnie that if he does not take his medications as prescribed, this could lead to him having a set-back with his CHF and may even lead to hospitalization- he again tells me he will "start" taking again soon, he is "just made right now" (I.e-- at his family)-- I will make CCM pharmacy team aware of Ronnie's report of non-adherence today, should they wish to follow up (no scheduled follow up outreach noted today) Reinforced previously provided education around signs/ symptoms CHF yellow zone, along with action plan for development of signs/ symptoms; rationale/ purpose of daily weight monitoring at home, along with general weight gain guidelines/ action plan for development of weight  gain Confirms "trying" to follow heart healthy low salt diet, "best as possible;" positive reinforcement provided with encouragement to continue efforts Confirmed continues to participate in regular activity/ exercise: reports walks "about every day;" on days weather is permitting; benefits of maintaining mobility/ staying active reinforced   Hypertension: (Status: Goal on Track (progressing): YES.)  Long Term Goal Last practice recorded BP readings:  BP Readings from Last 3 Encounters:  08/14/21 135/87  08/04/21 126/86  07/07/21 112/80  Most recent eGFR/CrCl:  Lab Results  Component Value Date   EGFR >90 04/13/2016    No components found for: CRCL  Evaluation of current treatment plan related to hypertension self management and Ronnie's adherence to plan as established by provider;   Reviewed prescribed diet heart healthy, low salt, carbohydrate modified/ low sugar Counseled on the importance of exercise goals with target of 150 minutes per week Discussed plans with Ronnie for ongoing care management follow up and provided Ronnie with direct contact information for care management team; Advised Ronnie, providing education and rationale, to monitor blood pressure daily  and record, calling PCP for findings outside established parameters;  Discussed complications of poorly controlled blood pressure such as heart disease, stroke, circulatory complications, vision complications, kidney impairment, sexual dysfunction;  Reviewed recent blood pressures at home: reports remains "under good control;" reports general ranges between 114-120/ 60-80; reports most recent blood pressure of 119/80; denies signs/ symptoms low blood pressure Encouraged Ronnie to continue monitoring/ recording blood pressures at home QD- reports he will do; confirms he continues to use e-tablet from his health insurance provider  Ronnie Goals/Self-Care Activities: As evidenced by review of EHR, collaboration with care  team, and Ronnie reporting during CCM RN CM outreach,  [name] will: Take medications as prescribed Attend all scheduled provider appointments Call pharmacy for medication refills Call provider office for new concerns or questions Continue to check blood pressures at home every day Continue to follow heart healthy, low salt, low cholesterol, carbohydrate-modified, low sugar diet Check your weight at home several times each week; write your weights down a calendar or piece of paper where you can keep track of them Continue to stay active and walk for exercise and for mood improvement- exercise is very good for your overall state of health Continue working with your established psychiatric counseling services provider    Plan: Telephone follow up appointment with care management team member scheduled for:  Monday, November 22, 2021 at 3:00 pm The Ronnie has been provided with contact information for the care management team and has been advised to call with any health related questions or concerns   Oneta Rack, RN, BSN, [place] [phone]: direct office

## 2021-09-29 NOTE — Patient Instructions (Addendum)
Visit Information  [name], thank you for taking time to talk with me today. Please don't hesitate to contact me if I can be of assistance to you before our next scheduled telephone appointment.  Below are the goals we discussed today:  Patient Self-Care Activities: Patient Dick will: Take medications as prescribed Attend all scheduled provider appointments Call pharmacy for medication refills Call provider office for new concerns or questions Continue to check blood pressures at home every day Continue to follow heart healthy, low salt, low cholesterol, carbohydrate-modified, low sugar diet Check your weight at home several times each week; write your weights down a calendar or piece of paper where you can keep track of them Continue to stay active and walk for exercise and for mood improvement- exercise is very good for your overall state of health Continue working with your established psychiatric counseling services provider Visit Information  Thank you for taking time to visit with me today. Please don't hesitate to contact me if I can be of assistance to you before our next scheduled telephone appointment.  Our next scheduled telephone follow up visit/ appointment with care management team member is scheduled on:  Monday, November 22, 2021 at 3:00 pm  If you need to cancel or re-schedule our visit, please call [phone] and our care guide team will be happy to assist you.   I look forward to hearing about your progress.   Oneta Rack, RN, BSN, [place] [phone]: direct office  If you are experiencing a Mental Health or [place] or need someone to talk to, please  call the Suicide and Crisis Lifeline: 988 call the [place] National Suicide Prevention Lifeline: 475-026-0602 or TTY: 785-177-1632 TTY (931)077-1692) to talk to a trained counselor call 1-800-273-TALK (toll free, 24 hour  hotline) go to [facility] [place] [phone]) call 911   Patient verbalizes understanding of instructions provided today and agrees to view in [place] of Medicine Management Taking your medicines correctly is an important part of managing or preventing medical problems. Make sure you know what disease or condition your medicine is treating, and how and when to take it. If you do not take your medicine correctly, it may not work well and may cause unpleasant side effects, including serious health problems. What should I do when I am taking medicines?  Read all the labels and inserts that come with your medicines. Review the information often and with each refill. Talk with your pharmacist if you get a refill and notice a change in the size, color, or shape of your medicines. Know the potential side effects for each medicine that you take. Try to get all your medicines from the same pharmacy. The pharmacist will have all your information and will understand how your medicines will affect each other (interact). Always carry an updated list of your medicines with you. If there is an emergency, a first responder can quickly see what medicines you are taking. Tell your health care provider about all your medicines, including over-the-counter medicines, vitamins, and herbal or dietary supplements. Your health care provider will make sure that nothing will interact with any of your prescribed medicines. How can I take my medicines safely? Take medicines only as told by your health care provider. Do not take more of your medicine than instructed. Do not take anyone else's medicines. Do not share your medicines with others. Do not  stop taking your medicines unless your health care provider tells you to do so. You may need to avoid alcohol or certain foods or liquids when taking certain medicines. Follow your health care provider's  instructions. Do not split, cut, crush, or chew your medicines unless your health care provider tells you to do so. Tell your health care provider if you have trouble swallowing your medicines. For liquid medicine, use the dosing container that was provided. Household spoons are not accurate. How should I organize my medicines? Know your medicines Know what each of your medicines looks like. This includes size, color, and shape. Tell your health care provider if you are having trouble recognizing all the medicines that you are taking. If you cannot tell your medicines apart because they look similar, keep them in the original bottles. If you cannot read the labels on the bottles, tell your pharmacist to put your medicines in containers with large print. Review your medicines and your schedule with family members, a friend, or a caregiver. Use a pill organizer Use a tool to organize your medicine schedule. Tools include a weekly pillbox, a written chart, a notebook, or a calendar. Your tool should help you remember the following things about each medicine: The name of the medicine. The amount (dose) to take. The schedule. This is the day and time the medicine should be taken. The appearance. This includes color, shape, size, and stamp. How to take your medicines. This includes instructions to take them with food, without food, with fluids, or with other medicines. Create reminders for taking your medicines. Use sticky notes, or use alarms on your watch, mobile device, or phone calendar. You may choose to use a more advanced management system. These systems have storage, alarms, and visual and audio prompts. Some medicines can be taken on an "as-needed" basis. These may include medicines for nausea, constipation, pain, cough and cold, allergies, and anxiety. If you take an as-needed medicine, write down the name and dose, as well as the date and time that you took it. How should I plan for  travel? Take your pillbox, medicines, and organization system with you when traveling. Have your medicines refilled before you travel. This will ensure that you do not run out of your medicines while you are away from home. Always carry an updated list of your medicines with you. If there is an emergency, a first responder can quickly see what medicines you are taking. Do not pack your medicines in checked luggage in case your luggage is lost or delayed. Keep your medicines in your carry-on bag. If any of your medicines is considered a controlled substance, make sure you bring a letter from your health care provider with you. How should I store and discard my medicines? For safe storage: Store medicines in a cool, dry area away from light, or as directed by your health care provider. Do not store medicines in the bathroom. Heat and humidity will affect them. Do not store your medicines with other chemicals or with medicines for pets or other household members. Keep medicines away from children and pets. Do not leave them on counters or bedside tables. Store them in high cabinets or on high shelves. For safe disposal: Check expiration dates regularly. Do not take expired medicines. Discard medicines that are older than the expiration date. Learn a safe way to dispose of your medicines. You may: Use a local government, hospital, or pharmacy medicine-take-back program. If you cannot return the medicine, check the label  or package insert to see if the medicine should be thrown out in the garbage or flushed down the toilet. If you are not sure, ask your health care team. If it is safe to put the medicine in the trash, empty the medicine out of the container. Mix the medicine with cat litter, dirt, coffee grounds, or another unwanted substance. Seal the mixture in a bag or container. Put it in the trash. What should I remember? Tell your health care provider if you: Experience side effects. Have new  symptoms. Feel that your medicine is no longer working. Have other concerns about taking your medicines. Review your medicines regularly with your health care provider. Other medicines, diet, medical conditions, weight changes, and daily habits can all affect how medicines work. Ask if you need to continue taking each medicine, and discuss how well each one is working. Refill your medicines early to avoid running out of them. In case of an accidental overdose, call your local poison control center at 1-(228)870-2391 or go to your local emergency department right away. Summary Taking your medicines correctly is an important part of managing or preventing medical problems. You need to make sure that you understand what you are taking a medicine for, as well as how and when you need to take it. Use a tool to organize your medicine schedule. Tools include a weekly pillbox, a written chart, a notebook, or a calendar. In case of an accidental overdose, call your local [place] at [phone] or go to your local emergency department right away. This information is not intended to replace advice given to you by your health care provider. Make sure you discuss any questions you have with your health care provider. Document Revised: 04/20/2021 Document Reviewed: 04/20/2021 Elsevier Patient Education  2022 [place], Adult After being diagnosed with anxiety, you may be relieved to know why you have felt or behaved a certain way. You may also feel overwhelmed about the treatment ahead and what it will mean for your life. With care and support, you can manage this condition. How to manage lifestyle changes Managing stress and anxiety Stress is your body's reaction to life changes and events, both good and bad. Most stress will last just a few hours, but stress can be ongoing and can lead to more than just stress. Although stress can play a major role in anxiety, it is  not the same as anxiety. Stress is usually caused by something external, such as a deadline, test, or competition. Stress normally passes after the triggering event has ended.  Anxiety is caused by something internal, such as imagining a terrible outcome or worrying that something will go wrong that will devastate you. Anxiety often does not go away even after the triggering event is over, and it can become long-term (chronic) worry. It is important to understand the differences between stress and anxiety and to manage your stress effectively so that it does not lead to an anxious response. Talk with your health care provider or a counselor to learn more about reducing anxiety and stress. He or she may suggest tension reduction techniques, such as: Music therapy. Spend time creating or listening to music that you enjoy and that inspires you. Mindfulness-based meditation. Practice being aware of your normal breaths while not trying to control your breathing. It can be done while sitting or walking. Centering prayer. This involves focusing on a word, phrase, or sacred image that means something to you and  brings you peace. Deep breathing. To do this, expand your stomach and inhale slowly through your nose. Hold your breath for 3-5 seconds. Then exhale slowly, letting your stomach muscles relax. Self-talk. Learn to notice and identify thought patterns that lead to anxiety reactions and change those patterns to thoughts that feel peaceful. Muscle relaxation. Taking time to tense muscles and then relax them. Choose a tension reduction technique that fits your lifestyle and personality. These techniques take time and practice. Set aside 5-15 minutes a day to do them. Therapists can offer counseling and training in these techniques. The training to help with anxiety may be covered by some insurance plans. Other things you can do to manage stress and anxiety include: Keeping a stress diary. This can help you  learn what triggers your reaction and then learn ways to manage your response. Thinking about how you react to certain situations. You may not be able to control everything, but you can control your response. Making time for activities that help you relax and not feeling guilty about spending your time in this way. Doing visual imagery. This involves imagining or creating mental pictures to help you relax. Practicing yoga. Through yoga poses, you can lower tension and promote relaxation.  Medicines Medicines can help ease symptoms. Medicines for anxiety include: Antidepressant medicines. These are usually prescribed for long-term daily control. Anti-anxiety medicines. These may be added in severe cases, especially when panic attacks occur. Medicines will be prescribed by a health care provider. When used together, medicines, psychotherapy, and tension reduction techniques may be the most effective treatment. Relationships Relationships can play a big part in helping you recover. Try to spend more time connecting with trusted friends and family members. Consider going to couples counseling if you have a partner, taking family education classes, or going to family therapy. Therapy can help you and others better understand your condition. How to recognize changes in your anxiety Everyone responds differently to treatment for anxiety. Recovery from anxiety happens when symptoms decrease and stop interfering with your daily activities at home or work. This may mean that you will start to: Have better concentration and focus. Worry will interfere less in your daily thinking. Sleep better. Be less irritable. Have more energy. Have improved memory. It is also important to recognize when your condition is getting worse. Contact your health care provider if your symptoms interfere with home or work and you feel like your condition is not improving. Follow these instructions at home: Activity Exercise.  Adults should do the following: Exercise for at least 150 minutes each week. The exercise should increase your heart rate and make you sweat (moderate-intensity exercise). Strengthening exercises at least twice a week. Get the right amount and quality of sleep. Most adults need 7-9 hours of sleep each night. Lifestyle  Eat a healthy diet that includes plenty of vegetables, fruits, whole grains, low-fat dairy products, and lean protein. Do not eat a lot of foods that are high in fats, added sugars, or salt (sodium). Make choices that simplify your life. Do not use any products that contain nicotine or tobacco. These products include cigarettes, chewing tobacco, and vaping devices, such as e-cigarettes. If you need help quitting, ask your health care provider. Avoid caffeine, alcohol, and certain over-the-counter cold medicines. These may make you feel worse. Ask your pharmacist which medicines to avoid. General instructions Take over-the-counter and prescription medicines only as told by your health care provider. Keep all follow-up visits. This is important. Where to find  support You can get help and support from these sources: Self-help groups. Online and [employer]. A trusted spiritual leader. Couples counseling. Family education classes. Family therapy. Where to find more information You may find that joining a support group helps you deal with your anxiety. The following sources can help you locate counselors or support groups near you: [place]: www.mentalhealthamerica.net Anxiety and Depression Association of [place] (ADAA): https://www.clark.net/ National Alliance on Mental Illness (NAMI): www.nami.org Contact a health care provider if: You have a hard time staying focused or finishing daily tasks. You spend many hours a day feeling worried about everyday life. You become exhausted by worry. You start to have headaches or frequently feel tense. You develop  chronic nausea or diarrhea. Get help right away if: You have a racing heart and shortness of breath. You have thoughts of hurting yourself or others. If you ever feel like you may hurt yourself or others, or have thoughts about taking your own life, get help right away. Go to your nearest emergency department or: Call your local emergency services (911 in the [place] Call a suicide crisis helpline, such as the [place] at 636 868 0460 or 988 in the [place]. This is open 24 hours a day in the [place] Text the Crisis Text Line at 646-156-4307 (in the [place].). Summary Taking steps to learn and use tension reduction techniques can help calm you and help prevent triggering an anxiety reaction. When used together, medicines, psychotherapy, and tension reduction techniques may be the most effective treatment. Family, friends, and partners can play a big part in supporting you. This information is not intended to replace advice given to you by your health care provider. Make sure you discuss any questions you have with your health care provider. Document Revised: 04/07/2021 Document Reviewed: 01/03/2021 Elsevier Patient Education  [place].

## 2021-10-01 ENCOUNTER — Telehealth: Payer: Self-pay

## 2021-10-01 NOTE — Telephone Encounter (Signed)
Key: [id number]

## 2021-10-01 NOTE — Telephone Encounter (Signed)
approved through 09/25/2022.

## 2021-10-06 ENCOUNTER — Other Ambulatory Visit: Payer: Self-pay | Admitting: Internal Medicine

## 2021-10-06 DIAGNOSIS — J449 Chronic obstructive pulmonary disease, unspecified: Secondary | ICD-10-CM

## 2021-10-09 ENCOUNTER — Emergency Department (HOSPITAL_COMMUNITY)
Admission: EM | Admit: 2021-10-09 | Discharge: 2021-10-10 | Discharge status: Against Medical Advice | Payer: Medicare Other

## 2021-10-09 DIAGNOSIS — R1084 Generalized abdominal pain: Secondary | ICD-10-CM | POA: Diagnosis not present
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
## 2021-10-17 IMAGING — CR DG CHEST 2V
2 series · 2 of 2 positions shown · non-contrast
Comparison: None.

CLINICAL DATA: Chest pain

EXAM:
CHEST - 2 VIEW

[chest pa]
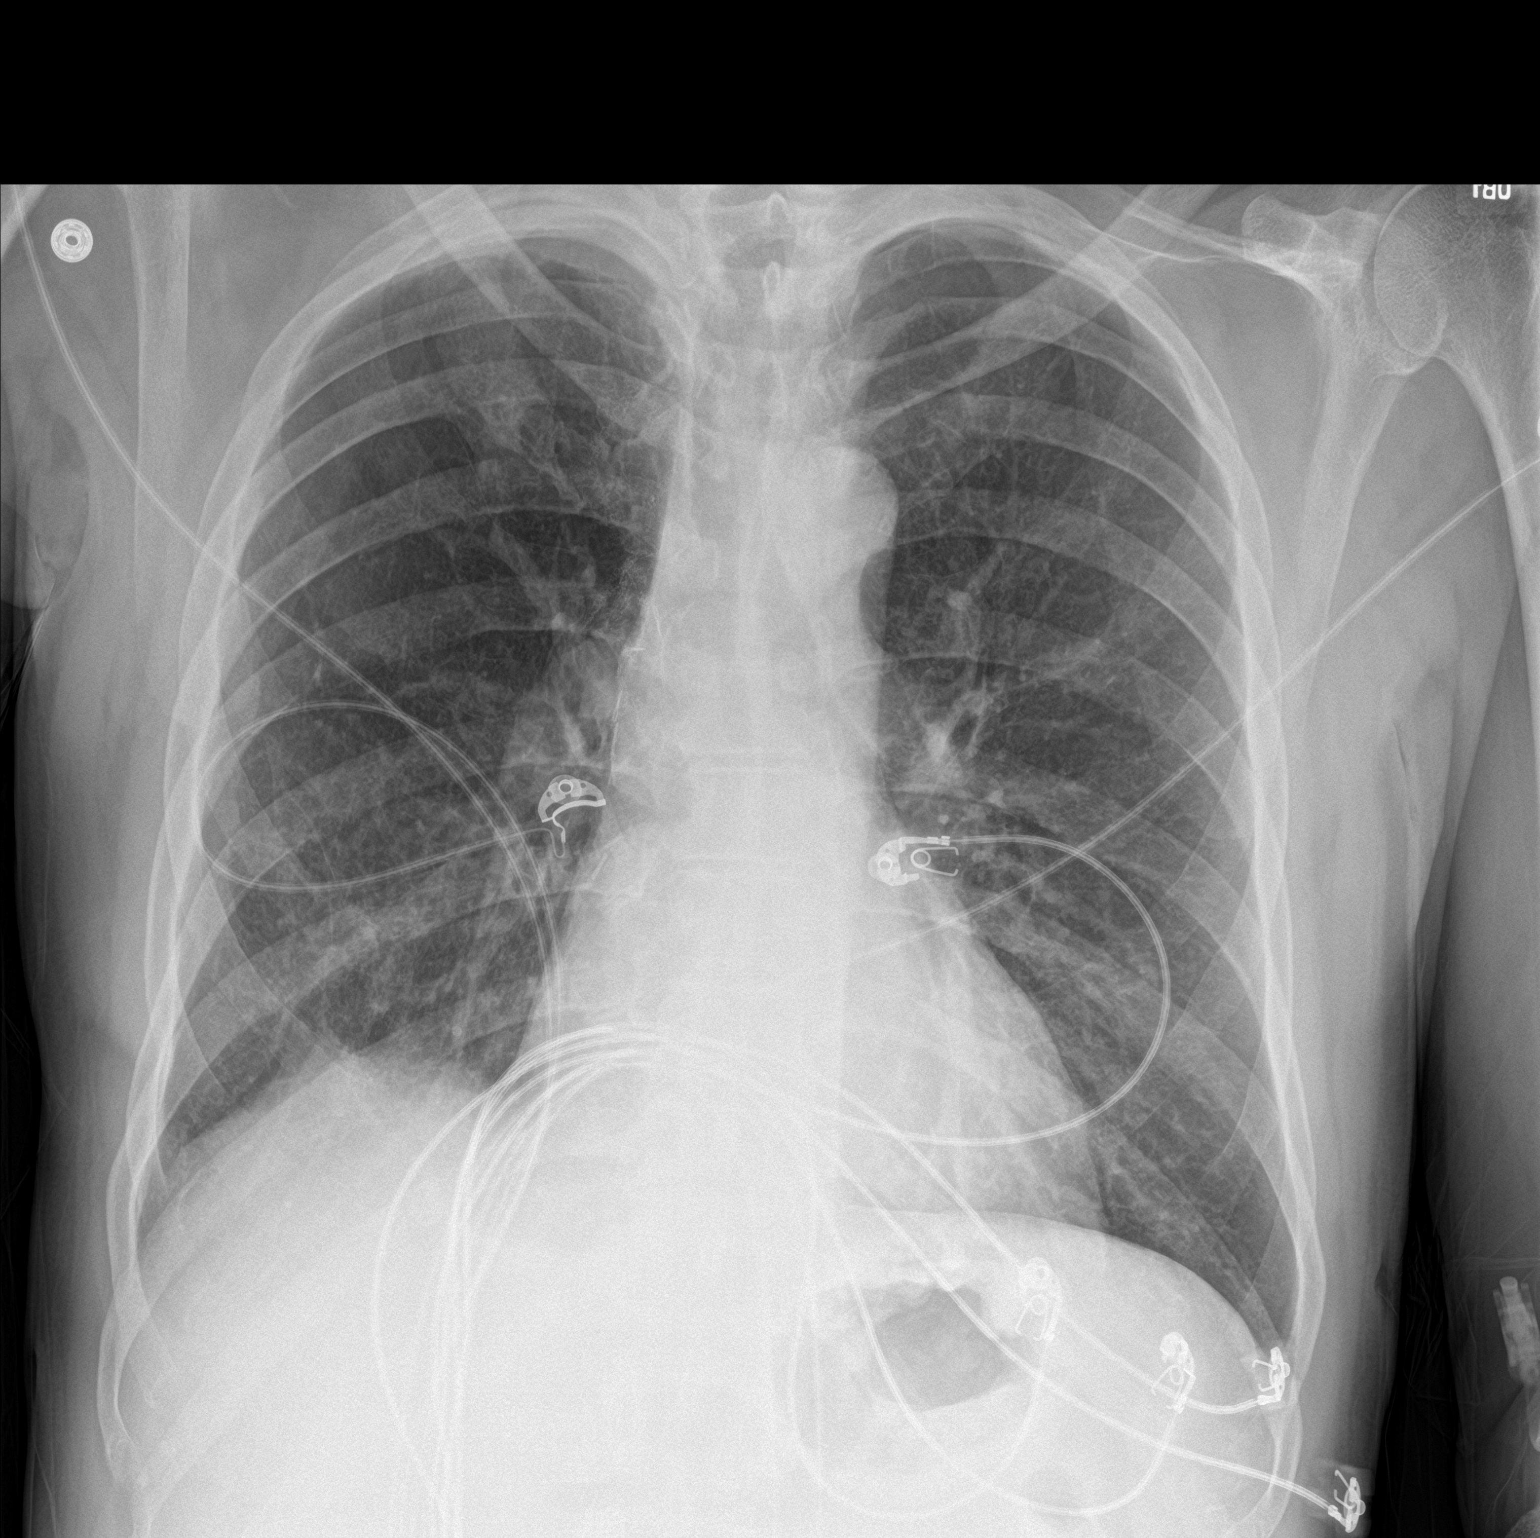

[chest lat]
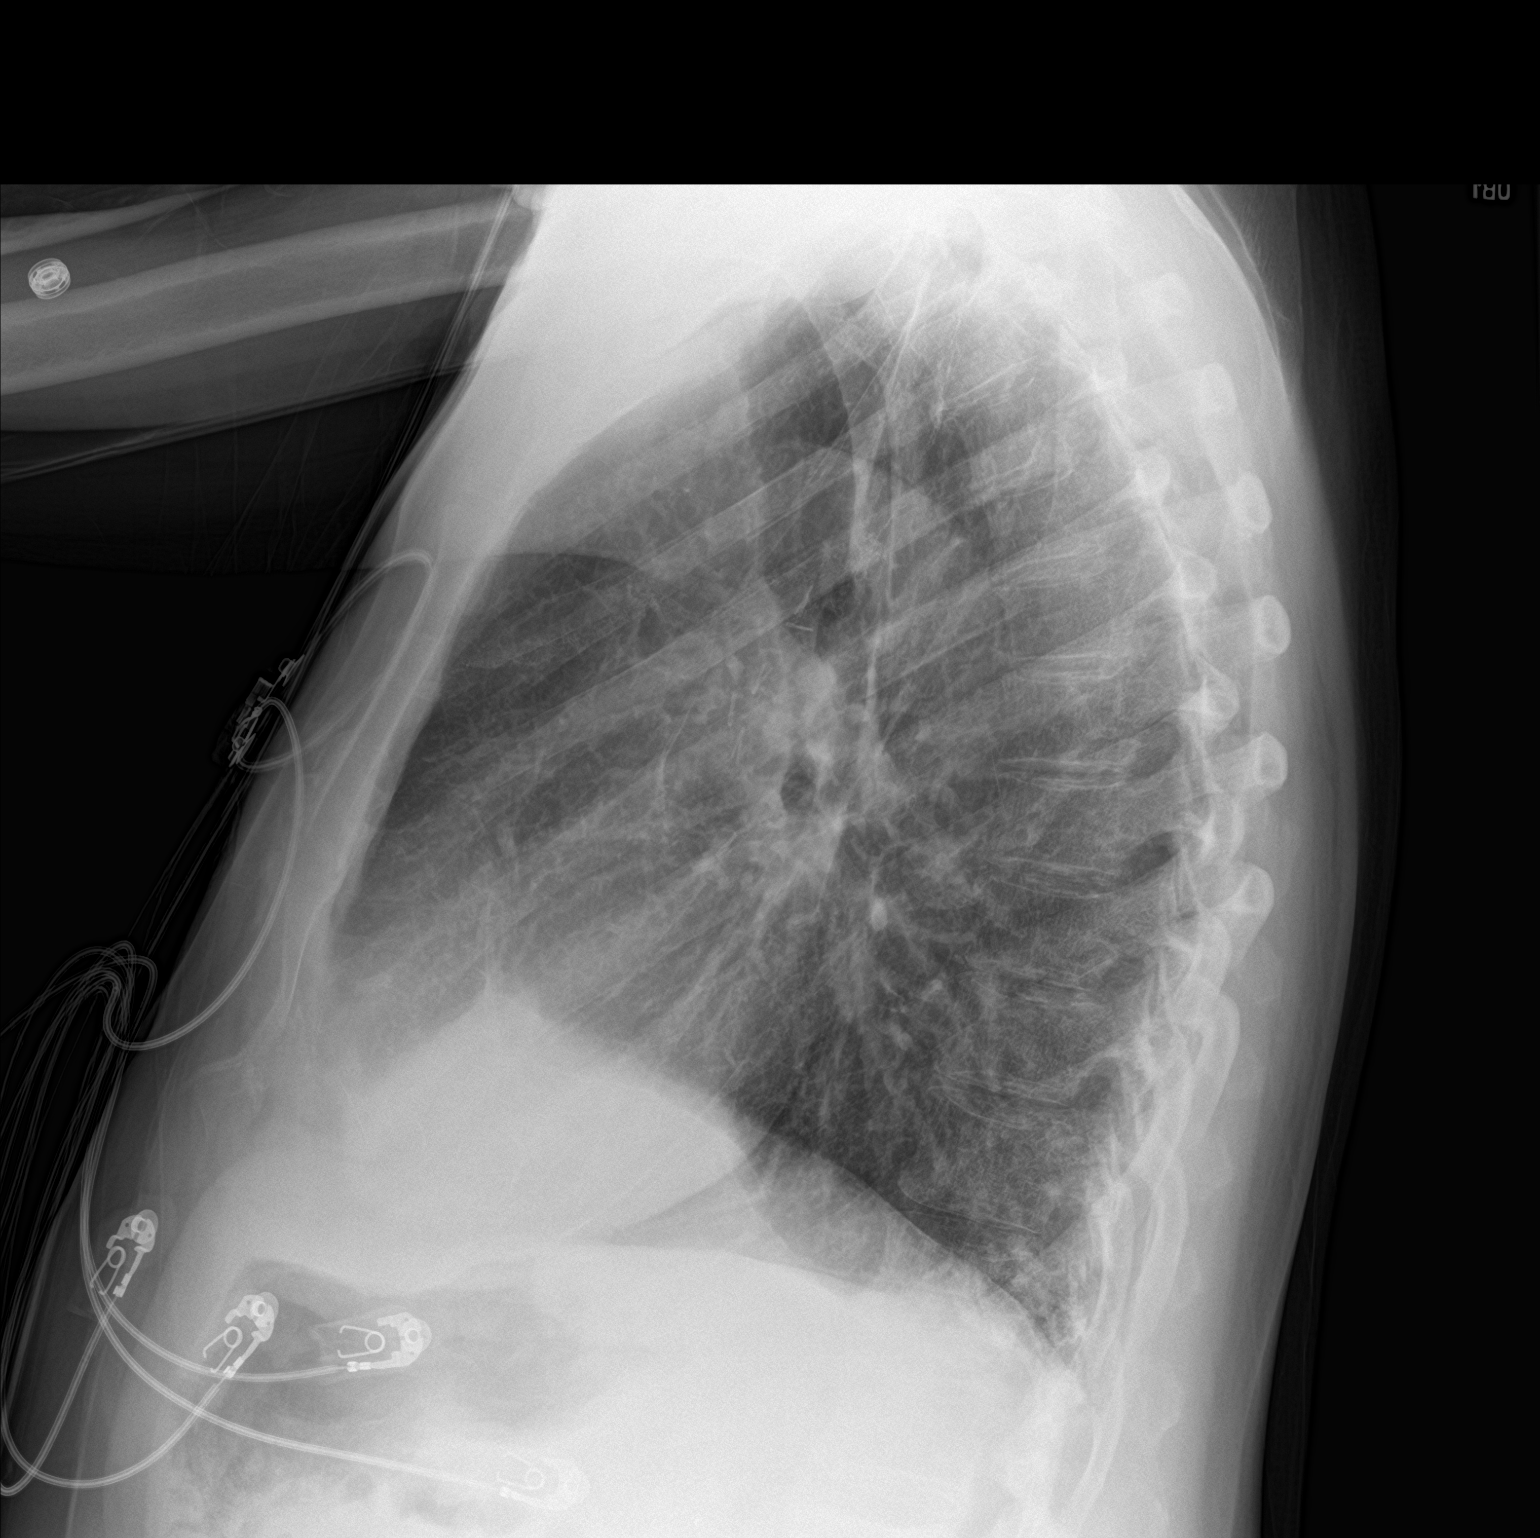

[2 of 2 positions shown; findings below may reference images not displayed]

FINDINGS: The heart size and mediastinal contours are within normal limits.
Both lungs are clear. No pleural effusion or pneumothorax. The
visualized skeletal structures are unremarkable.
IMPRESSION: No acute process in the chest.

## 2021-10-18 ENCOUNTER — Other Ambulatory Visit (HOSPITAL_COMMUNITY): Payer: Self-pay | Admitting: Physician Assistant

## 2021-10-18 ENCOUNTER — Other Ambulatory Visit (HOSPITAL_COMMUNITY): Payer: Self-pay | Admitting: Psychiatry

## 2021-10-18 DIAGNOSIS — F418 Other specified anxiety disorders: Secondary | ICD-10-CM

## 2021-10-18 DIAGNOSIS — G4700 Insomnia, unspecified: Secondary | ICD-10-CM

## 2021-10-18 DIAGNOSIS — G4730 Sleep apnea, unspecified: Secondary | ICD-10-CM

## 2021-10-19 ENCOUNTER — Telehealth: Payer: Medicare Other

## 2021-10-19 ENCOUNTER — Other Ambulatory Visit: Payer: Self-pay | Admitting: Internal Medicine

## 2021-10-19 DIAGNOSIS — I739 Peripheral vascular disease, unspecified: Secondary | ICD-10-CM

## 2021-10-19 DIAGNOSIS — E785 Hyperlipidemia, unspecified: Secondary | ICD-10-CM

## 2021-10-19 NOTE — Progress Notes (Deleted)
Chronic Care Management Pharmacy Note  10/19/2021 Name:  [name] MRN:  [id number] DOB:  1961/01/11  Summary: - Patient reports that he feels he is doing well, has no issues or concerns with current medications - Has not been checking blood pressures, will be out of entresto after this month, will need refills to last until next cardiology appointment 06/2021 - Does not currently have albuterol rescue inhaler, needs refilled - using nebulizer solution if needed at this time   Recommendations/Changes made from today's visit: Start: Saline Nasal Spray - as needed  Restart: Albuterol 108 mcg/act inhaler - use 2 puffs every 6 hours as needed for shortness of breath Stop: Diphenhydramine   Subjective: [name] is an 61 y.o. year old male who is a primary patient of [name], MD.  The CCM team was consulted for assistance with disease management and care coordination needs.    Engaged with patient by telephone for initial visit in response to provider referral for pharmacy case management and/or care coordination services.   Consent to Services:  The patient was given the following information about Chronic Care Management services today, agreed to services, and gave verbal consent: 1. CCM service includes personalized support from designated clinical staff supervised by the primary care provider, including individualized plan of care and coordination with other care providers 2. 24/7 contact phone numbers for assistance for urgent and routine care needs. 3. Service will only be billed when office clinical staff spend 20 minutes or more in a month to coordinate care. 4. Only one practitioner may furnish and bill the service in a calendar month. 5.The patient may stop CCM services at any time (effective at the end of the month) by phone call to the office staff. 6. The patient will be responsible for cost sharing (co-pay) of up to 20% of the service fee (after annual deductible is  met). Patient agreed to services and consent obtained.  Patient Care Team: [name], MD as PCP - General (Internal Medicine) Jerline Pain, MD as PCP - Cardiology (Cardiology) Warden Fillers, MD as Consulting Physician (Ophthalmology) Delice Bison, Darnelle Maffucci, [facility] as Pharmacist (Pharmacist) Knox Royalty, RN as Case Manager  Recent office visits: None since last visit   Recent consult visits: 07/07/2021 - Dr. Marlou Porch - Cardiology - stable, no changes to medications  03/18/2021 - Cassandra Heilingoetter PA-C - Oncology - stable, will now follow up with PCP - encouraged smoking cessation   Hospital visits: 10/09/2021 - ED visit - note not available at this time  08/13/2021 - ED Visit - chest pain / SOB - patient left due to wait time  07/30/2021 - 08/04/2021 [facility] admission - chest pain, suicidal, etoh intox - no changes to medications on discharge  Objective:  Lab Results  Component Value Date   CREATININE 0.69 08/13/2021   BUN 7 (L) 08/13/2021   GFR 85.64 01/11/2021   GFRNONAA >60 08/13/2021   GFRAA >60 05/17/2020   NA 142 08/13/2021   K 3.8 08/13/2021   CALCIUM 9.0 08/13/2021   CO2 22 08/13/2021   GLUCOSE 121 (H) 08/13/2021    Lab Results  Component Value Date/Time   HGBA1C 6.1 (H) 07/31/2021 03:12 AM   HGBA1C 5.8 (A) 07/20/2020 10:12 AM   HGBA1C 5.9 (A) 03/03/2020 08:54 AM   HGBA1C 6.0 12/04/2019 08:56 AM   FRUCTOSAMINE 180 06/23/2009 08:53 PM   GFR 85.64 01/11/2021 01:43 PM   GFR 96.95 07/20/2020 10:44 AM  MICROALBUR 11.8 (H) 12/04/2019 08:56 AM   MICROALBUR 0.2 10/08/2008 09:56 AM    Last diabetic Eye exam:  Lab Results  Component Value Date/Time   HMDIABEYEEXA No Retinopathy 01/08/2020 12:00 AM    Last diabetic Foot exam:  Lab Results  Component Value Date/Time   HMDIABFOOTEX normal 06/04/2012 12:00 AM     Lab Results  Component Value Date   CHOL 123 07/20/2020   HDL 35.20 (L) 07/20/2020   LDLCALC 71 07/20/2020   LDLDIRECT 122.0 11/30/2016    TRIG 86.0 07/20/2020   CHOLHDL 3 07/20/2020    Hepatic Function Latest Ref Rng & Units 08/04/2021 08/03/2021 08/02/2021  Total Protein 6.5 - 8.1 g/dL 6.5 6.1(L) 5.7(L)  Albumin 3.5 - 5.0 g/dL 3.3(L) 3.1(L) 3.0(L)  AST 15 - 41 U/L '19 16 18  ' ALT 0 - 44 U/L '23 20 19  ' Alk Phosphatase 38 - 126 U/L 67 69 63  Total Bilirubin 0.3 - 1.2 mg/dL 0.7 0.4 0.3  Bilirubin, Direct 0.0 - 0.3 mg/dL - - -    Lab Results  Component Value Date/Time   TSH 0.90 06/26/2019 08:36 AM   TSH 0.56 11/30/2016 11:37 AM    CBC Latest Ref Rng & Units 08/13/2021 08/04/2021 08/03/2021  WBC 4.0 - 10.5 K/uL 7.7 5.9 5.1  Hemoglobin 13.0 - 17.0 g/dL 16.5 15.9 15.3  Hematocrit 39.0 - 52.0 % 47.8 46.4 44.1  Platelets 150 - 400 K/uL 103(L) 95(L) 92(L)    No results found for: VD25OH  Clinical ASCVD: No  The ASCVD Risk score (Arnett DK, et al., 2019) failed to calculate for the following reasons:   The valid total cholesterol range is 130 to 320 mg/dL    Depression screen [facility] 2/9 08/18/2021 03/11/2021 01/11/2021  Decreased Interest '1 3 1  ' Down, Depressed, Hopeless '1 3 1  ' PHQ - 2 Score '2 6 2  ' Altered sleeping 0 0 0  Tired, decreased energy 1 0 0  Change in appetite 1 1 0  Feeling bad or failure about yourself  '1 3 1  ' Trouble concentrating 1 0 0  Moving slowly or fidgety/restless 0 0 0  Suicidal thoughts 0 1 0  PHQ-9 Score '6 11 3  ' Difficult doing work/chores Somewhat difficult - -  Some encounter information is confidential and restricted. Go to Review Flowsheets activity to see all data.  Some recent data might be hidden    Social History   Tobacco Use  Smoking Status Every Day   Packs/day: 1.00   Years: 38.00   Pack years: 38.00   Types: Cigarettes  Smokeless Tobacco Never  Tobacco Comments   one pack cigarettes daily   BP Readings from Last 3 Encounters:  08/14/21 135/87  08/04/21 126/86  07/07/21 112/80   Pulse Readings from Last 3 Encounters:  08/14/21 100  08/04/21 73  07/07/21 82   Wt  Readings from Last 3 Encounters:  08/04/21 191 lb 8 oz (86.9 kg)  07/07/21 190 lb (86.2 kg)  03/18/21 186 lb 12.8 oz (84.7 kg)   BMI Readings from Last 3 Encounters:  08/04/21 24.59 kg/m  07/07/21 23.75 kg/m  03/18/21 23.35 kg/m    Assessment/Interventions: Review of patient past medical history, allergies, medications, health status, including review of consultants reports, laboratory and other test data, was performed as part of comprehensive evaluation and provision of chronic care management services.   SDOH:  (Social Determinants of Health) assessments and interventions performed: Yes   SDOH Screenings   Alcohol Screen: Low Risk  Last Alcohol Screening Score (AUDIT): 0  Depression (PHQ2-9): Medium Risk   PHQ-2 Score: 22  Financial Resource Strain: Low Risk    Difficulty of Paying Living Expenses: Not hard at all  Food Insecurity: No Food Insecurity   Worried About Charity fundraiser in the Last Year: Never true   Ran Out of Food in the Last Year: Never true  Housing: Low Risk    Last Housing Risk Score: 0  Physical Activity: Sufficiently Active   Days of Exercise per Week: 5 days   Minutes of Exercise per Session: 30 min  Social Connections: Moderately Isolated   Frequency of Communication with Friends and Family: More than three times a week   Frequency of Social Gatherings with Friends and Family: Once a week   Attends Religious Services: Never   Marine scientist or Organizations: Yes   Attends Archivist Meetings: Never   Marital Status: Never married  Stress: Stress Concern Present   Feeling of Stress : To some extent  Tobacco Use: High Risk   Smoking Tobacco Use: Every Day   Smokeless Tobacco Use: Never   Passive Exposure: Not on file  Transportation Needs: No Transportation Needs   Lack of Transportation (Medical): No   Lack of Transportation (Non-Medical): No    CCM Care Plan  Allergies  Allergen Reactions   Ace Inhibitors Cough    Aspirin Other (See Comments)    Reaction:  Nose bleeds and GI bleeding    Clopidogrel Bisulfate Other (See Comments)    Reaction:  Nose bleeds    Crestor [Rosuvastatin Calcium] Other (See Comments)    Reaction:  Leg cramps    Metformin And Related Diarrhea   Rosuvastatin Cough    Medications Reviewed Today     Reviewed by Knox Royalty, RN (Registered Nurse) on 09/29/21 at [place] List Status: <None>   Medication Order Taking? Sig Documenting Provider Last Dose Status Informant  acetaminophen (TYLENOL) 500 MG tablet [id number] No Take 1,000 mg by mouth 2 (two) times daily as needed for moderate pain. [provider] [place] Active Self  albuterol (PROVENTIL) (2.5 MG/3ML) 0.083% nebulizer solution [id number] No Take 3 mLs (2.5 mg total) by nebulization every 6 (six) hours as needed for wheezing or shortness of breath. [name], MD unknown Active Self  albuterol (VENTOLIN HFA) 108 (90 Base) MCG/ACT inhaler [id number]  Inhale 2 puffs into the lungs every 4 (four) hours as needed for wheezing. [name], MD  Active   [facility] ELLIPTA 62.5-25 MCG/ACT AEPB [id number]  Inhale 1 puff into the lungs daily. [name], MD  Active   apixaban (ELIQUIS) 5 MG TABS tablet [id number]  Take 2 tablets (10 mg total) by mouth 2 (two) times daily for 2 days. Thurnell Lose, MD  Expired 08/06/21 2359   apixaban (ELIQUIS) 5 MG TABS tablet [id number]  Take 2 tablets (10 mg total) by mouth 2 (two) times daily for 2 days then take 1 tablet (5 mg total) by mouth 2 (two) times daily. Thurnell Lose, MD  Active   ARIPiprazole (ABILIFY) 5 MG tablet [id number]  Take 1 tablet (5 mg total) by mouth daily. [place], Terese Door, PA  Active   busPIRone (BUSPAR) 30 MG tablet [id number]  Take 1 tablet (30 mg total) by mouth 2 (two) times daily. Malachy Mood, PA  Active   carvedilol (COREG) 6.25 MG tablet [id number] No Take 1 tablet (6.25 mg total) by mouth 2 (  two) times daily with a meal. [name], MD 07/30/2021 0800 Active Self  cetirizine (ZYRTEC) 10 MG tablet [id number]  Take 1 tablet (10 mg total) by mouth daily. [name], MD  Active   DESCOVY 200-25 MG tablet [id number] No Take 1 tablet by mouth daily. [name], MD 07/30/2021 Active Self  fluticasone (FLONASE) 50 MCG/ACT nasal spray [id number]  Place 2 sprays into both nostrils daily. [name], MD  Active   folic acid (FOLVITE) 1 MG tablet [id number]  Take 1 tablet (1 mg total) by mouth daily. Thurnell Lose, MD  Active   hydrOXYzine (ATARAX) 50 MG tablet [id number]  Take 1 tablet (50 mg total) by mouth 3 (three) times daily as needed for anxiety. Malachy Mood, PA  Active   Multiple Vitamin (MULTIVITAMIN WITH MINERALS) TABS tablet [id number] No Take 1 tablet by mouth daily. Connye Burkitt, NP 07/30/2021 Active Self  pantoprazole (PROTONIX) 40 MG tablet [id number]  Take 1 tablet (40 mg total) by mouth daily. Thurnell Lose, MD  Active   pravastatin (PRAVACHOL) 40 MG tablet [id number] No Take 1 tablet (40 mg total) by mouth daily. [name], MD 07/30/2021 Active Self  sacubitril-valsartan (ENTRESTO) 24-26 MG [id number] No Take 1 tablet by mouth 2 (two) times daily. Jerline Pain, MD 07/30/2021 Active Self  sertraline (ZOLOFT) 100 MG tablet [id number]  Take 2 tablets (200 mg total) by mouth daily. Malachy Mood, PA  Active   thiamine 100 MG tablet [id number]  Take 0.5 tablets (50 mg total) by mouth daily. Thurnell Lose, MD  Active   traZODone (DESYREL) 100 MG tablet [id number] No Take 3 tablets (300 mg total) by mouth at bedtime.  Patient taking differently: Take 100 mg by mouth at bedtime.   Salley Slaughter, NP 07/29/2021 Active Self            Patient Active Problem List   Diagnosis Date Noted   Severe episode of recurrent major depressive disorder, without psychotic features ([place]) 07/31/2021   Pulmonary embolism ([place]) 07/30/2021   Alcohol abuse with intoxication ([place]) 07/30/2021   Suicidal  ideation 07/30/2021   Long toenail 01/11/2021   Aortic atherosclerosis ([place])    Generalized anxiety disorder 11/18/2020   Moderate episode of recurrent major depressive disorder ([place]) 11/18/2020   Panic disorder 11/18/2020   Schizophrenia ([place]) 05/18/2020   Thiamine deficiency 12/10/2019   Screening-pulmonary TB 12/04/2019   Irritable bowel syndrome with diarrhea 07/01/2019   Type II diabetes mellitus with manifestations ([place]) 06/26/2019   De Quervain's tenosynovitis, left 04/11/2018   Sleep apnea, primary central 11/30/2016   Insomnia w/ sleep apnea 11/30/2016   Syphili, latent 03/05/2016   Glaucoma suspect of both eyes 11/19/2015   Non-small cell carcinoma of lung, stage 1 ([place]) 10/12/2015   COPD GOLD II if use fev1/VC and still smoking  08/11/2015   High risk homosexual behavior 07/09/2014   PUD (peptic ulcer disease) 01/09/2013   Routine general medical examination at a health care facility 06/04/2012   Paranoid schizophrenia ([place]) 08/01/2011   DJD (degenerative joint disease) of knee 03/15/2011   Obstructive sleep apnea 11/29/2010   ERECTILE DYSFUNCTION, 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/2010   Allergic rhinitis 03/17/2010   Smoking 12/14/2009   HYPERTENSION, BENIGN 10/05/2009   Nonischemic cardiomyopathy ([place]) 10/05/2009   PAD (peripheral artery disease) ([place]) 09/15/2009   Hx of adenomatous colonic polyps 12/03/2008   Hyperlipidemia with target LDL less than 130 06/23/2008   BPH associated with nocturia  06/23/2008    Immunization History  Administered Date(s) Administered   Hep A / Hep B 07/14/2009   Hepatitis A 07/21/2008   Influenza Inj Mdck Quad Pf 06/14/2019   Influenza Split 09/29/2011, 06/04/2012   Influenza Whole 06/12/2008, 06/23/2009, 08/05/2010   Influenza,inj,Quad PF,6+ Mos 06/06/2013, 07/08/2014, 06/01/2016, 10/03/2017, 06/05/2018, 07/20/2020, 08/03/2021   Influenza-Unspecified 07/28/2015   PFIZER(Purple Top)SARS-COV-2 Vaccination 12/05/2019, 01/01/2020, 06/02/2020    Pneumococcal Conjugate-13 06/06/2013   Pneumococcal Polysaccharide-23 12/06/2007, 10/12/2015, 01/11/2021   Td 04/09/2003   Tdap 06/06/2013, 07/11/2015   Zoster Recombinat (Shingrix) 04/05/2018, 06/06/2018    Conditions to be addressed/monitored:  Hypertension, Hyperlipidemia, Heart Failure, COPD, Allergic Rhinitis, and HIV prevention   There are no care plans that you recently modified to display for this patient.      Medication Assistance: None required.  Patient affirms current coverage meets needs.  Compliance/Adherence/Medication fill history: Care Gaps: Colonoscopy Foot Exam, A1c, yearly Eye exam   Patient's preferred pharmacy is:  [facility], [place] [address] [place] [address] Phone: [phone] Fax: [phone]    Uses pill box? No - feels that he can manage without  Pt endorses 100% compliance  Care Plan and Follow Up Patient Decision:  Patient agrees to Care Plan and Follow-up.  Plan: Telephone follow up appointment with care management team member scheduled for:  2 months  and The patient has been provided with contact information for the care management team and has been advised to call with any health related questions or concerns.   ***

## 2021-10-21 ENCOUNTER — Other Ambulatory Visit (HOSPITAL_COMMUNITY): Payer: Self-pay | Admitting: Physician Assistant

## 2021-10-21 ENCOUNTER — Other Ambulatory Visit: Payer: Self-pay | Admitting: Internal Medicine

## 2021-10-21 DIAGNOSIS — Z7252 High risk homosexual behavior: Secondary | ICD-10-CM

## 2021-10-21 DIAGNOSIS — F332 Major depressive disorder, recurrent severe without psychotic features: Secondary | ICD-10-CM

## 2021-10-26 DIAGNOSIS — I10 Essential (primary) hypertension: Secondary | ICD-10-CM | POA: Diagnosis not present

## 2021-11-02 ENCOUNTER — Other Ambulatory Visit (HOSPITAL_COMMUNITY): Payer: Self-pay | Admitting: Physician Assistant

## 2021-11-02 ENCOUNTER — Other Ambulatory Visit (HOSPITAL_COMMUNITY): Payer: Self-pay | Admitting: Psychiatry

## 2021-11-02 ENCOUNTER — Other Ambulatory Visit: Payer: Self-pay | Admitting: Internal Medicine

## 2021-11-02 DIAGNOSIS — F332 Major depressive disorder, recurrent severe without psychotic features: Secondary | ICD-10-CM

## 2021-11-02 DIAGNOSIS — F418 Other specified anxiety disorders: Secondary | ICD-10-CM

## 2021-11-02 DIAGNOSIS — F410 Panic disorder [episodic paroxysmal anxiety] without agoraphobia: Secondary | ICD-10-CM

## 2021-11-02 DIAGNOSIS — G4700 Insomnia, unspecified: Secondary | ICD-10-CM

## 2021-11-02 DIAGNOSIS — J449 Chronic obstructive pulmonary disease, unspecified: Secondary | ICD-10-CM

## 2021-11-02 DIAGNOSIS — G4730 Sleep apnea, unspecified: Secondary | ICD-10-CM

## 2021-11-02 DIAGNOSIS — F411 Generalized anxiety disorder: Secondary | ICD-10-CM

## 2021-11-02 MED ORDER — TRAZODONE HCL 100 MG PO TABS: 300.0000 mg | ORAL_TABLET | Freq: Every day | ORAL | 2 refills | Status: DC

## 2021-11-02 NOTE — Telephone Encounter (Signed)
This patient needs refills. He is your patient.

## 2021-11-02 NOTE — Progress Notes (Signed)
Patient's medication to be e-prescribed to preferred pharmacy,

## 2021-11-02 NOTE — Telephone Encounter (Signed)
Message acknowledged and reviewed.

## 2021-11-02 NOTE — Telephone Encounter (Signed)
[name] patient

## 2021-11-04 ENCOUNTER — Telehealth: Payer: Self-pay | Admitting: Internal Medicine

## 2021-11-04 NOTE — Telephone Encounter (Signed)
N/A unable to leave a  message for patient to call back to schedule Medicare Annual Wellness Visit   Last AWV  11/11/20  Please schedule at anytime with [facility] [place] if patient calls the office back.    40 Minutes appointment   Any questions, please call me at [phone]

## 2021-11-16 ENCOUNTER — Other Ambulatory Visit (HOSPITAL_COMMUNITY): Payer: Self-pay | Admitting: Physician Assistant

## 2021-11-16 DIAGNOSIS — F411 Generalized anxiety disorder: Secondary | ICD-10-CM

## 2021-11-22 ENCOUNTER — Ambulatory Visit (INDEPENDENT_AMBULATORY_CARE_PROVIDER_SITE_OTHER): Payer: 59 | Admitting: *Deleted

## 2021-11-22 DIAGNOSIS — I10 Essential (primary) hypertension: Secondary | ICD-10-CM

## 2021-11-22 DIAGNOSIS — I429 Cardiomyopathy, unspecified: Secondary | ICD-10-CM

## 2021-11-22 NOTE — Patient Instructions (Signed)
Visit Information  [name], thank you for taking time to talk with me today. Please don't hesitate to contact me if I can be of assistance to you before our next scheduled telephone appointment.  Below are the goals we discussed today:  Patient Self-Care Activities: Patient [name] will: Take medications as prescribed Attend all scheduled provider appointments Call pharmacy for medication refills Call provider office for new concerns or questions Continue to check blood pressures at home regularly Continue to follow heart healthy, low salt, low cholesterol, carbohydrate-modified, low sugar diet Check your weight at home several times each week; write your weights down a calendar or piece of paper where you can keep track of them Continue to stay active and walk for exercise and for mood improvement- exercise is very good for your overall state of health Continue working with your established psychiatric counseling services provider Consider scheduling your annual Primary Care Appointment with Dr. Ronnald Ramp: your last appointment was January 11, 2021  Our next scheduled telephone follow up visit/ appointment is scheduled on: Tuesday, Feb 22, 2022 at 3:00  pm- This is a PHONE [place] appointment  If you need to cancel or re-schedule our visit, please call [phone] and our care guide team will be happy to assist you.   I look forward to hearing about your progress.   Oneta Rack, RN, BSN, [place] [phone]: direct office  If you are experiencing a Mental Health or [place] or need someone to talk to, please  call the Suicide and Crisis Lifeline: 988 call the [place] National Suicide Prevention Lifeline: 813-329-9811 or TTY: (929)067-5360 TTY 475 789 1826) to talk to a trained counselor call 1-800-273-TALK (toll free, 24 hour hotline) go to [facility] [address],  [place] [phone]) call 911   Patient verbalizes understanding of instructions and care plan provided today and agrees to view in [facility]. Active MyChart status confirmed with patient  Living With Heart Failure Heart failure is a long-term (chronic) condition in which the heart cannot pump enough blood through the body. When this happens, parts of the body do not get the blood and oxygen they need. There is no cure for heart failure at this time, so it is important for you to take good care of yourself and follow the treatment plan you set with your health care provider. If you are living with heart failure, there are ways to help you manage the disease. How to manage lifestyle changes Living with heart failure requires you to make changes in your life. Your health care team will teach you about the changes you need to make in order to relieve your symptoms and lower your risk of going to the hospital. Work with your health care provider to develop a treatment plan that works for you. Activity Ask your health care provider about attending cardiac rehabilitation. These programs include aerobic physical activity, which provides many benefits for your heart. If no cardiac rehabilitation program is available, ask your health care provider what aerobic exercises are safe for you to do. Return to your normal activities as told by your health care provider. Ask your health care provider what activities are safe for you. Pace your daily activities and allow time for rest as needed. Managing stress It is normal to have many emotions about your diagnosis, such as fear, sadness, anger, and loss. If you feel any of these emotions and need help coping, contact your  health care provider. Here are some ways to help yourself manage these emotions: Talk to friends and family members about your condition. They can give you support and guidance. Explain your symptoms to them and, if comfortable, invite them to  attend appointments or rehabilitation with you. Join a support group for people with chronic heart failure. Talking with other people who have the same symptoms may give you new ways of coping with your disease and your emotions. Accept help from others. Do not be ashamed if you need help with certain tasks. Use stress management techniques, such as meditation, breathing exercises, or listening to relaxing music. Conditions such as depression and anxiety are common in persons with heart failure. Pay attention to changes in your mood, emotions, and stress levels. Tell your health care provider if you have any of the following symptoms: Trouble sleeping or a change in your sleeping patterns. Feeling sad, down, or depressed more often than not, every day for more than 2 weeks. Losing interest in activities you normally enjoy. Feeling irritable or crying for no reason. Finding yourself worrying about the future often. Work You may need to develop a plan with your health care provider if heart failure interferes with your ability to work. This may include: Reducing your work hours. Finding functions that are less active or require less effort. Planning rest periods during your work hours. Travel Talk with your health care provider if you plan to travel. There may be circumstances in which your health care provider recommends that you do not travel or that you delay travel until your condition is under control. When you travel, bring your medicine and a list of your medicines. If you are traveling by air, keep your medicines with you in a carry-on bag. Consider finding a medical facility in the area you will be traveling to and determine what your health insurance will cover. If you will be traveling by public transportation (airplane, train, bus), contact the company prior to traveling if you have special needs. This may include needs related to diet, oxygen, a wheelchair, a seating request, or help  with luggage. If you use oxygen, make sure to bring enough oxygen with you. If you have a battery-powered device, bring a fully charged extra battery with you. If you have a device, bring a note from your health care provider and inform all security screening personnel that you have the device. You may need to go through special screening for safety. Sexual activity  Ask your health care provider when it is safe for you to resume sexual activity. You may need to start slowly and gradually increase intimacy. You can increase intimacy by doing such things as caressing, touching, and holding each other. Get regular exercise as told by your health care provider. This can benefit your sex life by building strength and endurance. Sleep If your condition interferes with your sleep, find ways to improve your sleep quality, such as: Sleep lying on your side, or sleep with your head elevated by raising the head of your bed or using multiple pillows. Ask your health care provider about screening for sleep apnea. Try to go to sleep and wake up at the same times every day. Sleep in a dark, cool room. Do not do any physical activity or eating for a few hours before bedtime. Plan rest periods during the day, but do not take long naps during the day.  Where to find support Consider talking with: Family members. Close friends. A mental  health professional or therapist. A member of your church, faith, or community group. Other sources of support include: Local support groups. Ask your health care provider about groups near you. Online support groups, such as those found through the American Heart Association: supportnetwork.heart.org Local home care agencies, community agencies, or social agencies. A palliative care specialist. Palliative care can help you manage symptoms, promote comfort, improve quality of life, and maintain dignity. Where to find more information American Heart Association:  heart.org National Heart, Lung, and Blood Institute: https://www.hartman-hill.biz/ Centers for Disease Control and Prevention: https://www.reeves.com/ [place]: SolutionApps.it Contact a health care provider if: You have a rapid weight gain. You have increasing shortness of breath that is unusual for you. You are unable to participate in your usual physical activities. You tire easily. You have difficulty sleeping, such as: You wake up feeling short of breath. You have to use more pillows to raise your head in order to sleep. You cough more than normal, especially with physical activity. You have any swelling or more swelling in areas such as your hands, feet, ankles, or abdomen. You become dizzy or light-headed when you stand up. You have changes to your appetite. You have symptoms of depression or anxiety. Get help right away if: You have difficulty breathing. You notice or your family notices a change in your awareness, such as having trouble staying awake or having difficulty with concentration. You have pain or discomfort in your chest. You have an episode of fainting (syncope). You feel like your heart is beating quickly (palpitations). You have extreme feelings of sadness or loss of hope, or you have thoughts about hurting yourself or others. These symptoms may represent a serious problem that is an emergency. Do not wait to see if the symptoms will go away. Get medical help right away. Call your local emergency services (911 in the [place] Do not drive yourself to the hospital. Summary There is no cure for heart failure, so it is important for you to take good care of yourself and follow the treatment plan set by your health care provider. Ask your health care provider about attending cardiac rehabilitation. These programs include aerobic physical activity, which provides many benefits for your heart. It is normal to have many emotions about your diagnosis, such as fear,  sadness, anger, and loss. If you feel any of these emotions and need help coping, contact your health care provider. You may need to develop a plan with your health care provider if heart failure interferes with your ability to work. This information is not intended to replace advice given to you by your health care provider. Make sure you discuss any questions you have with your health care provider. Document Revised: 04/27/2020 Document Reviewed: 04/27/2020 Elsevier Patient Education  2022 [employer].

## 2021-11-22 NOTE — Chronic Care Management (AMB) (Signed)
Chronic Care Management   CCM RN Visit Note  11/22/2021 Name: [name] MRN: [id number] DOB: Jul 22, 1961  Subjective: [name] is a 61 y.o. year old male who is a primary care patient of [name], MD. The care management team was consulted for assistance with disease management and care coordination needs.    Engaged with patient by telephone for follow up visit in response to provider referral for case management and/or care coordination services.   Consent to Services:  The patient was given information about Chronic Care Management services, agreed to services, and gave verbal consent prior to initiation of services.  Please see initial visit note for detailed documentation.  Patient agreed to services and verbal consent obtained.   Assessment: Review of patient past medical history, allergies, medications, health status, including review of consultants reports, laboratory and other test data, was performed as part of comprehensive evaluation and provision of chronic care management services.   SDOH (Social Determinants of Health) assessments and interventions performed:  SDOH Interventions    Flowsheet Row Most Recent Value  SDOH Interventions   Food Insecurity Interventions Intervention Not Indicated  [Continues to deny food insecurity]  Transportation Interventions Intervention Not Indicated  [reports continues using SCAT,  lives near [employer]- walks to errands]      CCM Care Plan  Allergies  Allergen Reactions   Ace Inhibitors Cough   Aspirin Other (See Comments)    Reaction:  Nose bleeds and GI bleeding    Clopidogrel Bisulfate Other (See Comments)    Reaction:  Nose bleeds    Crestor [Rosuvastatin Calcium] Other (See Comments)    Reaction:  Leg cramps    Metformin And Related Diarrhea   Rosuvastatin Cough   Outpatient Encounter Medications as of 11/22/2021  Medication Sig   acetaminophen (TYLENOL) 500 MG tablet Take 1,000 mg by mouth  2 (two) times daily as needed for moderate pain.   albuterol (PROVENTIL) (2.5 MG/3ML) 0.083% nebulizer solution Take 3 mLs (2.5 mg total) by nebulization every 6 (six) hours as needed for wheezing or shortness of breath.   albuterol (VENTOLIN HFA) 108 (90 Base) MCG/ACT inhaler Inhale 2 puffs into the lungs every 4 (four) hours as needed for wheezing.   ANORO ELLIPTA 62.5-25 MCG/ACT AEPB Inhale 1 puff into the lungs daily.   apixaban (ELIQUIS) 5 MG TABS tablet Take 2 tablets (10 mg total) by mouth 2 (two) times daily for 2 days.   apixaban (ELIQUIS) 5 MG TABS tablet Take 2 tablets (10 mg total) by mouth 2 (two) times daily for 2 days then take 1 tablet (5 mg total) by mouth 2 (two) times daily.   ARIPiprazole (ABILIFY) 5 MG tablet Take 1 tablet (5 mg total) by mouth daily.   busPIRone (BUSPAR) 30 MG tablet Take 1 tablet (30 mg total) by mouth 2 (two) times daily.   carvedilol (COREG) 6.25 MG tablet Take 1 tablet (6.25 mg total) by mouth 2 (two) times daily with a meal.   cetirizine (ZYRTEC) 10 MG tablet Take 1 tablet (10 mg total) by mouth daily.   DESCOVY 200-25 MG tablet Take 1 tablet by mouth daily.   fluticasone (FLONASE) 50 MCG/ACT nasal spray Place 2 sprays into both nostrils daily.   folic acid (FOLVITE) 1 MG tablet Take 1 tablet (1 mg total) by mouth daily.   hydrOXYzine (ATARAX) 50 MG tablet Take 1 tablet (50 mg total) by mouth 3 (three) times daily as needed for anxiety.  Multiple Vitamin (MULTIVITAMIN WITH MINERALS) TABS tablet Take 1 tablet by mouth daily.  ° pantoprazole (PROTONIX) 40 MG tablet Take 1 tablet (40 mg total) by mouth daily.  ° pravastatin (PRAVACHOL) 40 MG tablet Take 1 tablet (40 mg total) by mouth daily.  ° sacubitril-valsartan (ENTRESTO) 24-26 MG Take 1 tablet by mouth 2 (two) times daily.  ° sertraline (ZOLOFT) 100 MG tablet Take 2 tablets (200 mg total) by mouth daily.  ° thiamine 100 MG tablet Take 0.5 tablets (50 mg total) by mouth daily.  ° traZODone (DESYREL) 100 MG  tablet Take 3 tablets (300 mg total) by mouth at bedtime.  ° °No facility-administered encounter medications on file as of 11/22/2021.  ° °Patient Active Problem List  ° Diagnosis Date Noted  ° Severe episode of recurrent major depressive disorder, without psychotic features (HCC) 07/31/2021  ° Pulmonary embolism (HCC) 07/30/2021  ° Alcohol abuse with intoxication (HCC) 07/30/2021  ° Suicidal ideation 07/30/2021  ° Long toenail 01/11/2021  ° Aortic atherosclerosis (HCC)   ° Generalized anxiety disorder 11/18/2020  ° Moderate episode of recurrent major depressive disorder (HCC) 11/18/2020  ° Panic disorder 11/18/2020  ° Schizophrenia (HCC) 05/18/2020  ° Thiamine deficiency 12/10/2019  ° Screening-pulmonary TB 12/04/2019  ° Irritable bowel syndrome with diarrhea 07/01/2019  ° Type II diabetes mellitus with manifestations (HCC) 06/26/2019  ° De Quervain's tenosynovitis, left 04/11/2018  ° Sleep apnea, primary central 11/30/2016  ° Insomnia w/ sleep apnea 11/30/2016  ° Syphili, latent 03/05/2016  ° Glaucoma suspect of both eyes 11/19/2015  ° Non-small cell carcinoma of lung, stage 1 (HCC) 10/12/2015  ° COPD GOLD II if use fev1/VC and still smoking  08/11/2015  ° High risk homosexual behavior 07/09/2014  ° PUD (peptic ulcer disease) 01/09/2013  ° Routine general medical examination at a health care facility 06/04/2012  ° Paranoid schizophrenia (HCC) 08/01/2011  ° DJD (degenerative joint disease) of knee 03/15/2011  ° Obstructive sleep apnea 11/29/2010  ° ERECTILE DYSFUNCTION, 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/2010  ° Allergic rhinitis 03/17/2010  ° Smoking 12/14/2009  ° HYPERTENSION, BENIGN 10/05/2009  ° Nonischemic cardiomyopathy (HCC) 10/05/2009  ° PAD (peripheral artery disease) (HCC) 09/15/2009  ° Hx of adenomatous colonic polyps 12/03/2008  ° Hyperlipidemia with target LDL less than 130 06/23/2008  ° BPH associated with nocturia 06/23/2008  ° °Conditions to be addressed/monitored:  CHF and HTN ° °Care Plan : RN Care Manager Plan of Care   °Updates made by ,  M, RN since 11/22/2021 12:00 AM  °  ° °Problem: Chronic Disease Management Needs   °Priority: High  °  ° °Long-Range Goal: Ongoing adherence to established plan of care for long term chronic disease management   °Start Date: 08/18/2021  °Expected End Date: 08/18/2022  °Priority: High  °Note:   °Current Barriers:  °Chronic Disease Management support and education needs related to CHF and HTN °Recent hospitalization November 4-9, 2022 for mental health needs, incidental COVID positive finding; small pulmonary embolism °Mental health needs: well established with psychiatric provider for counseling services;  °Reports intermittent non-adherence to medications: CCM Pharmacy team involved/ updated ° °RNCM Clinical Goal(s):  °Patient will demonstrate ongoing health management independence as evidenced by patient reporting of self-health management independence, adherence to plan of care        through collaboration with RN Care manager, provider, and care team.  ° °Interventions: °1:1 collaboration with primary care provider regarding development and update of comprehensive plan of care as evidenced by provider attestation and co-signature °Inter-disciplinary care team   collaboration (see longitudinal plan of care) °Evaluation of current treatment plan related to  self management and patient's adherence to plan as established by provider °Initial assessment completed 08/18/21 °11/22/21: °SDOH updated: no new concerns/ unmet needs identified °Pain Assessment updated: patient continues to deny acute/ chronic pain; reports "occasional" knee pain, denies pain today °Falls assessment updated: denies new/ recent falls; reports "feels steady" on feet with ambulation, occasionally uses cane "if knee pain is flared up;" otherwise does not use assistive devices; previously provided education around fall risks/ prevention reinforced; positive reinforcement provided with encouragement to continue  efforts °Today, patient denies medication concerns and reports ongoing independent self-management of medications; he again denies medication review, but tells me is now taking Abilify for depression °Confirmed patient has continued ongoing counseling with established psychiatric provider: today, he reports "everything is fine;" and he denies unmet psychiatric needs; depression screening updated; he feels "good about" where he "is at;" reports next psychiatry office visit "in 2 weeks" °Confirmed patient did not re-schedule cardiology and PCP provider office visits, as per our last outreach in January; he was again encouraged to re-schedule and I reminded him that his last PCP office visit was almost a year ago: encouraged him to re-schedule promptly so he does not experience issues around medication refill lapses; he tells me he will do ° °Heart Failure Interventions:  (Status: 11/22/21: Goal on Track (progressing): YES.)  Long Term Goal  °Discussed importance of daily weight and advised patient to weigh and record daily °Discussed the importance of keeping all appointments with provider °Provided patient with education about the role of exercise in the management of heart failure °Confirmed no clinical concerns: reports "feeling real good, having no problems" °Confirms he continues to monitor and record his weights, "about every day;" today, he reports consistent weight ranges between 186-189 lbs with a weight today of "188 lbs" °Denies clinical concerns around breathing, lower extremity swelling °Today, he denies previously reported non-adherence to medications-- he has CCM pharmacy team on care team; he could benefit from ongoing support/ involvement from CCM pharmacy team as he has mentioned several times that he does not always take medications as prescribed, usually due to his mood/ flares with depression/ anxiety; will message second time to hopefully get medication adherence concerns addressed by pharmacy  team °Reinforced previously provided education around signs/ symptoms CHF yellow zone, along with action plan for development of signs/ symptoms; rationale/ purpose of daily weight monitoring at home, along with general weight gain guidelines/ action plan for development of weight gain °Confirms "trying" to follow heart healthy low salt diet, "best as possible;" positive reinforcement provided with encouragement to continue efforts °Confirmed continues to participate in regular activity/ exercise: reports walks "about every day;" on days weather is permitting; benefits of maintaining mobility/ staying active reinforced  ° °Hypertension: (Status: 11/22/21: Goal on Track (progressing): YES.)  Long Term Goal °Last practice recorded BP readings:  °BP Readings from Last 3 Encounters:  °08/14/21 135/87  °08/04/21 126/86  °07/07/21 112/80  °Most recent eGFR/CrCl:  °Lab Results  °Component Value Date  ° EGFR >90 04/13/2016  °  No components found for: CRCL ° °Evaluation of current treatment plan related to hypertension self management and patient's adherence to plan as established by provider;   °Reviewed prescribed diet heart healthy, low salt, carbohydrate modified/ low sugar °Counseled on the importance of exercise goals with target of 150 minutes per week °Discussed plans with patient for ongoing care management follow up and provided patient with   direct contact information for care management team; °Discussed complications of poorly controlled blood pressure such as heart disease, stroke, circulatory complications, vision complications, kidney impairment, sexual dysfunction;  °Reviewed recent blood pressures at home: reports remains "under good control;" reports general ranges between 114-130/ 60-80; reports most recent blood pressure of 108/86; denies signs/ symptoms low blood pressure °Encouraged patient to continue monitoring/ recording blood pressures at home weekly- reports he will do;  ° °Patient Goals/Self-Care  Activities: °As evidenced by review of EHR, collaboration with care team, and patient reporting during CCM RN CM outreach,  °Patient Amrit will: °Take medications as prescribed °Attend all scheduled provider appointments °Call pharmacy for medication refills °Call provider office for new concerns or questions °Continue to check blood pressures at home regularly °Continue to follow heart healthy, low salt, low cholesterol, carbohydrate-modified, low sugar diet °Check your weight at home several times each week; write your weights down a calendar or piece of paper where you can keep track of them °Continue to stay active and walk for exercise and for mood improvement- exercise is very good for your overall state of health °Continue working with your established psychiatric counseling services provider °Consider scheduling your annual Primary Care Appointment with Dr. Jones: your last appointment was January 11, 2021 °  ° °Plan: °Telephone follow up appointment with care management team member scheduled for:  Tuesday, Feb 22, 2022 at 3:00 pm °The patient has been provided with contact information for the care management team and has been advised to call with any health related questions or concerns  ° ° [place] , RN, BSN, CCRN Alumnus °CCM Clinic RN Care Coordination- [facility] °[phone]: direct office ° ° ° ° ° ° ° ° ° °

## 2021-11-23 DIAGNOSIS — I509 Heart failure, unspecified: Secondary | ICD-10-CM

## 2021-11-23 DIAGNOSIS — I110 Hypertensive heart disease with heart failure: Secondary | ICD-10-CM

## 2021-11-25 ENCOUNTER — Telehealth: Payer: Self-pay | Admitting: Internal Medicine

## 2021-11-25 NOTE — Telephone Encounter (Signed)
Left message for patient to call back to schedule Medicare Annual Wellness Visit  ? ?Last AWV  11/11/20 ? ?Please schedule at anytime with [facility] if patient calls the office back.   ? ?40 Minutes appointment  ? ?Any questions, please call me at [phone]  ?

## 2021-11-26 ENCOUNTER — Telehealth (HOSPITAL_COMMUNITY): Payer: 59 | Admitting: Physician Assistant

## 2021-11-26 ENCOUNTER — Encounter (HOSPITAL_COMMUNITY): Payer: Self-pay

## 2021-12-02 ENCOUNTER — Other Ambulatory Visit: Payer: Self-pay | Admitting: Internal Medicine

## 2021-12-02 DIAGNOSIS — J449 Chronic obstructive pulmonary disease, unspecified: Secondary | ICD-10-CM

## 2021-12-02 DIAGNOSIS — Z7252 High risk homosexual behavior: Secondary | ICD-10-CM

## 2021-12-06 ENCOUNTER — Other Ambulatory Visit: Payer: Self-pay | Admitting: Student

## 2021-12-06 DIAGNOSIS — F1010 Alcohol abuse, uncomplicated: Secondary | ICD-10-CM

## 2021-12-07 ENCOUNTER — Other Ambulatory Visit: Payer: Self-pay | Admitting: Internal Medicine

## 2021-12-07 DIAGNOSIS — Z7252 High risk homosexual behavior: Secondary | ICD-10-CM

## 2021-12-08 ENCOUNTER — Other Ambulatory Visit: Payer: Self-pay | Admitting: Internal Medicine

## 2021-12-08 DIAGNOSIS — J449 Chronic obstructive pulmonary disease, unspecified: Secondary | ICD-10-CM

## 2021-12-10 ENCOUNTER — Other Ambulatory Visit: Payer: 59

## 2021-12-14 ENCOUNTER — Other Ambulatory Visit (HOSPITAL_COMMUNITY): Payer: Self-pay | Admitting: Physician Assistant

## 2021-12-14 DIAGNOSIS — F332 Major depressive disorder, recurrent severe without psychotic features: Secondary | ICD-10-CM

## 2021-12-14 DIAGNOSIS — F411 Generalized anxiety disorder: Secondary | ICD-10-CM

## 2021-12-14 NOTE — Telephone Encounter (Signed)
Needs a follow-up appointment in order for medications to be refilled.

## 2021-12-17 ENCOUNTER — Telehealth: Payer: Self-pay

## 2021-12-17 NOTE — Chronic Care Management (AMB) (Signed)
? ? ?  Chronic Care Management ?Pharmacy Assistant  ? ?Name: [name]  MRN: [id number] DOB: 05-Dec-1960 ? ?Made a call to patient to schedule a follow up with clinical pharmacist Dan on 02/03/22 at 9:45am. Left patient to return call to confirm if date is ok with him. ?

## 2021-12-20 NOTE — Telephone Encounter (Signed)
His appt is scheduled for 12/28/21 @ 3:30 virtual

## 2021-12-22 ENCOUNTER — Ambulatory Visit: Payer: 59 | Admitting: Internal Medicine

## 2021-12-28 ENCOUNTER — Encounter (HOSPITAL_COMMUNITY): Payer: Self-pay

## 2021-12-28 ENCOUNTER — Telehealth (HOSPITAL_COMMUNITY): Payer: Medicare Other | Admitting: Physician Assistant

## 2021-12-28 ENCOUNTER — Telehealth (HOSPITAL_COMMUNITY): Payer: Self-pay | Admitting: Physician Assistant

## 2021-12-28 ENCOUNTER — Other Ambulatory Visit (HOSPITAL_COMMUNITY): Payer: Self-pay | Admitting: Physician Assistant

## 2021-12-28 DIAGNOSIS — F418 Other specified anxiety disorders: Secondary | ICD-10-CM

## 2021-12-28 DIAGNOSIS — F410 Panic disorder [episodic paroxysmal anxiety] without agoraphobia: Secondary | ICD-10-CM

## 2021-12-28 DIAGNOSIS — F332 Major depressive disorder, recurrent severe without psychotic features: Secondary | ICD-10-CM

## 2021-12-28 DIAGNOSIS — F411 Generalized anxiety disorder: Secondary | ICD-10-CM

## 2021-12-28 DIAGNOSIS — G4700 Insomnia, unspecified: Secondary | ICD-10-CM

## 2021-12-28 NOTE — Telephone Encounter (Signed)
Patient meets last follow up appointment. Patient must have documented follow up appointment for medications to be refilled.

## 2021-12-28 NOTE — Telephone Encounter (Signed)
Called patient to inform he missed today's appointment as well as his last appt 11/26/21 with U. Nwoko PA. Unable to reach pt on cell. Called pt listed home phone, male answered stating we have the wrong number. Mobile Crisis left a message on listed home phone also.

## 2022-01-03 ENCOUNTER — Emergency Department (HOSPITAL_COMMUNITY): Payer: Medicare Other

## 2022-01-03 ENCOUNTER — Other Ambulatory Visit: Payer: Self-pay

## 2022-01-03 ENCOUNTER — Inpatient Hospital Stay (HOSPITAL_COMMUNITY)
Admission: EM | Admit: 2022-01-03 | Discharge: 2022-01-13 | DRG: 897 | Disposition: A | Discharge status: Skilled Nursing Facility | Payer: Medicare Other | Attending: Internal Medicine | Admitting: Internal Medicine

## 2022-01-03 ENCOUNTER — Telehealth: Payer: Self-pay

## 2022-01-03 DIAGNOSIS — F10231 Alcohol dependence with withdrawal delirium: Secondary | ICD-10-CM | POA: Diagnosis present

## 2022-01-03 DIAGNOSIS — Z79899 Other long term (current) drug therapy: Secondary | ICD-10-CM | POA: Diagnosis not present

## 2022-01-03 DIAGNOSIS — R410 Disorientation, unspecified: Secondary | ICD-10-CM | POA: Diagnosis present

## 2022-01-03 DIAGNOSIS — G312 Degeneration of nervous system due to alcohol: Secondary | ICD-10-CM | POA: Diagnosis present

## 2022-01-03 DIAGNOSIS — I428 Other cardiomyopathies: Secondary | ICD-10-CM

## 2022-01-03 DIAGNOSIS — J441 Chronic obstructive pulmonary disease with (acute) exacerbation: Secondary | ICD-10-CM | POA: Diagnosis not present

## 2022-01-03 DIAGNOSIS — R4182 Altered mental status, unspecified: Secondary | ICD-10-CM

## 2022-01-03 DIAGNOSIS — Z7982 Long term (current) use of aspirin: Secondary | ICD-10-CM | POA: Diagnosis not present

## 2022-01-03 DIAGNOSIS — I700 Atherosclerosis of aorta: Secondary | ICD-10-CM | POA: Diagnosis present

## 2022-01-03 DIAGNOSIS — N401 Enlarged prostate with lower urinary tract symptoms: Secondary | ICD-10-CM | POA: Diagnosis present

## 2022-01-03 DIAGNOSIS — J439 Emphysema, unspecified: Secondary | ICD-10-CM | POA: Diagnosis present

## 2022-01-03 DIAGNOSIS — F17210 Nicotine dependence, cigarettes, uncomplicated: Secondary | ICD-10-CM | POA: Diagnosis present

## 2022-01-03 DIAGNOSIS — F0282 Dementia in other diseases classified elsewhere, unspecified severity, with psychotic disturbance: Secondary | ICD-10-CM | POA: Diagnosis present

## 2022-01-03 DIAGNOSIS — R296 Repeated falls: Secondary | ICD-10-CM | POA: Diagnosis present

## 2022-01-03 DIAGNOSIS — Z8249 Family history of ischemic heart disease and other diseases of the circulatory system: Secondary | ICD-10-CM

## 2022-01-03 DIAGNOSIS — I739 Peripheral vascular disease, unspecified: Secondary | ICD-10-CM | POA: Diagnosis not present

## 2022-01-03 DIAGNOSIS — F200 Paranoid schizophrenia: Secondary | ICD-10-CM

## 2022-01-03 DIAGNOSIS — I10 Essential (primary) hypertension: Secondary | ICD-10-CM | POA: Diagnosis present

## 2022-01-03 DIAGNOSIS — E785 Hyperlipidemia, unspecified: Secondary | ICD-10-CM | POA: Diagnosis not present

## 2022-01-03 DIAGNOSIS — Z8616 Personal history of COVID-19: Secondary | ICD-10-CM

## 2022-01-03 DIAGNOSIS — F10229 Alcohol dependence with intoxication, unspecified: Secondary | ICD-10-CM | POA: Diagnosis present

## 2022-01-03 DIAGNOSIS — K219 Gastro-esophageal reflux disease without esophagitis: Secondary | ICD-10-CM | POA: Diagnosis present

## 2022-01-03 DIAGNOSIS — Z7252 High risk homosexual behavior: Secondary | ICD-10-CM

## 2022-01-03 DIAGNOSIS — I110 Hypertensive heart disease with heart failure: Secondary | ICD-10-CM | POA: Diagnosis present

## 2022-01-03 DIAGNOSIS — K7689 Other specified diseases of liver: Secondary | ICD-10-CM | POA: Diagnosis not present

## 2022-01-03 DIAGNOSIS — Z86711 Personal history of pulmonary embolism: Secondary | ICD-10-CM

## 2022-01-03 DIAGNOSIS — Z813 Family history of other psychoactive substance abuse and dependence: Secondary | ICD-10-CM

## 2022-01-03 DIAGNOSIS — Z888 Allergy status to other drugs, medicaments and biological substances status: Secondary | ICD-10-CM | POA: Diagnosis not present

## 2022-01-03 DIAGNOSIS — Z8371 Family history of colonic polyps: Secondary | ICD-10-CM

## 2022-01-03 DIAGNOSIS — F209 Schizophrenia, unspecified: Secondary | ICD-10-CM | POA: Diagnosis present

## 2022-01-03 DIAGNOSIS — Z818 Family history of other mental and behavioral disorders: Secondary | ICD-10-CM

## 2022-01-03 DIAGNOSIS — I2510 Atherosclerotic heart disease of native coronary artery without angina pectoris: Secondary | ICD-10-CM | POA: Diagnosis present

## 2022-01-03 DIAGNOSIS — Z820 Family history of epilepsy and other diseases of the nervous system: Secondary | ICD-10-CM

## 2022-01-03 DIAGNOSIS — Z823 Family history of stroke: Secondary | ICD-10-CM

## 2022-01-03 DIAGNOSIS — G309 Alzheimer's disease, unspecified: Secondary | ICD-10-CM | POA: Diagnosis present

## 2022-01-03 DIAGNOSIS — R338 Other retention of urine: Secondary | ICD-10-CM | POA: Diagnosis present

## 2022-01-03 DIAGNOSIS — E1151 Type 2 diabetes mellitus with diabetic peripheral angiopathy without gangrene: Secondary | ICD-10-CM | POA: Diagnosis present

## 2022-01-03 DIAGNOSIS — Z886 Allergy status to analgesic agent status: Secondary | ICD-10-CM | POA: Diagnosis not present

## 2022-01-03 DIAGNOSIS — F10931 Alcohol use, unspecified with withdrawal delirium: Secondary | ICD-10-CM | POA: Diagnosis not present

## 2022-01-03 DIAGNOSIS — F419 Anxiety disorder, unspecified: Secondary | ICD-10-CM | POA: Diagnosis present

## 2022-01-03 DIAGNOSIS — Z85118 Personal history of other malignant neoplasm of bronchus and lung: Secondary | ICD-10-CM

## 2022-01-03 DIAGNOSIS — D72829 Elevated white blood cell count, unspecified: Secondary | ICD-10-CM | POA: Diagnosis not present

## 2022-01-03 DIAGNOSIS — Z7901 Long term (current) use of anticoagulants: Secondary | ICD-10-CM

## 2022-01-03 DIAGNOSIS — Z800 Family history of malignant neoplasm of digestive organs: Secondary | ICD-10-CM

## 2022-01-03 DIAGNOSIS — I5022 Chronic systolic (congestive) heart failure: Secondary | ICD-10-CM

## 2022-01-03 DIAGNOSIS — F32A Depression, unspecified: Secondary | ICD-10-CM | POA: Diagnosis present

## 2022-01-03 DIAGNOSIS — F10129 Alcohol abuse with intoxication, unspecified: Secondary | ICD-10-CM | POA: Diagnosis present

## 2022-01-03 DIAGNOSIS — R2681 Unsteadiness on feet: Secondary | ICD-10-CM | POA: Diagnosis not present

## 2022-01-03 DIAGNOSIS — Z21 Asymptomatic human immunodeficiency virus [HIV] infection status: Secondary | ICD-10-CM | POA: Diagnosis not present

## 2022-01-03 DIAGNOSIS — Z811 Family history of alcohol abuse and dependence: Secondary | ICD-10-CM

## 2022-01-03 DIAGNOSIS — E782 Mixed hyperlipidemia: Secondary | ICD-10-CM | POA: Diagnosis present

## 2022-01-03 DIAGNOSIS — R112 Nausea with vomiting, unspecified: Secondary | ICD-10-CM | POA: Diagnosis present

## 2022-01-03 DIAGNOSIS — Z955 Presence of coronary angioplasty implant and graft: Secondary | ICD-10-CM

## 2022-01-03 LAB — COMPREHENSIVE METABOLIC PANEL
ALT: 207 U/L — ABNORMAL HIGH (ref 0–44)
AST: 115 U/L — ABNORMAL HIGH (ref 15–41)
Albumin: 4.3 g/dL (ref 3.5–5.0)
Alkaline Phosphatase: 55 U/L (ref 38–126)
Anion gap: 15 (ref 5–15)
BUN: 29 mg/dL — ABNORMAL HIGH (ref 8–23)
CO2: 22 mmol/L (ref 22–32)
Calcium: 9.9 mg/dL (ref 8.9–10.3)
Chloride: 102 mmol/L (ref 98–111)
Creatinine, Ser: 1.06 mg/dL (ref 0.61–1.24)
GFR, Estimated: >60 mL/min (ref >60)
Glucose, Bld: 143 mg/dL — ABNORMAL HIGH (ref 70–99)
Potassium: 3.7 mmol/L (ref 3.5–5.1)
Sodium: 139 mmol/L (ref 135–145)
Total Bilirubin: 2.3 mg/dL — ABNORMAL HIGH (ref 0.3–1.2)
Total Protein: 7.7 g/dL (ref 6.5–8.1)

## 2022-01-03 LAB — HIV ANTIBODY (ROUTINE TESTING W REFLEX): HIV Screen 4th Generation wRfx: NONREACTIVE

## 2022-01-03 LAB — RAPID URINE DRUG SCREEN, HOSP PERFORMED
Amphetamines: NOT DETECTED
Barbiturates: NOT DETECTED
Benzodiazepines: NOT DETECTED
Cocaine: NOT DETECTED
Opiates: NOT DETECTED
Tetrahydrocannabinol: NOT DETECTED

## 2022-01-03 LAB — CREATININE, SERUM
Creatinine, Ser: 1.11 mg/dL (ref 0.61–1.24)
GFR, Estimated: >60 mL/min (ref >60)

## 2022-01-03 LAB — AMMONIA: Ammonia: 23 umol/L (ref 9–35)

## 2022-01-03 LAB — VITAMIN B12: Vitamin B-12: 718 pg/mL (ref 180–914)

## 2022-01-03 LAB — ETHANOL: Alcohol, Ethyl (B): <10 mg/dL (ref <10)

## 2022-01-03 LAB — CBG MONITORING, ED: Glucose-Capillary: 145 mg/dL — ABNORMAL HIGH (ref 70–99)

## 2022-01-03 LAB — CBC
HCT: 52.2 % — ABNORMAL HIGH (ref 39.0–52.0)
Hemoglobin: 18 g/dL — ABNORMAL HIGH (ref 13.0–17.0)
MCH: 31.5 pg (ref 26.0–34.0)
MCHC: 34.5 g/dL (ref 30.0–36.0)
MCV: 91.4 fL (ref 80.0–100.0)
Platelets: 119 10*3/uL — ABNORMAL LOW (ref 150–400)
RBC: 5.71 MIL/uL (ref 4.22–5.81)
RDW: 14.5 % (ref 11.5–15.5)
WBC: 10.3 10*3/uL (ref 4.0–10.5)
nRBC: 0 % (ref 0.0–0.2)

## 2022-01-03 LAB — TSH: TSH: 1.738 u[IU]/mL (ref 0.350–4.500)

## 2022-01-03 MED ORDER — FLEET ENEMA 7-19 GM/118ML RE ENEM
1.0000 | ENEMA | Freq: Once | RECTAL | Status: DC | PRN
  Filled 2022-01-03: qty 1

## 2022-01-03 MED ORDER — LORAZEPAM 1 MG PO TABS
0.0000 mg | ORAL_TABLET | Freq: Two times a day (BID) | ORAL | Status: AC
  Administered 2022-01-05: 1 mg via ORAL
  Administered 2022-01-06 – 2022-01-07 (×3): 2 mg via ORAL
  Filled 2022-01-03 (×3): qty 2
  Filled 2022-01-03: qty 1

## 2022-01-03 MED ORDER — UMECLIDINIUM-VILANTEROL 62.5-25 MCG/ACT IN AEPB
1.0000 | INHALATION_SPRAY | Freq: Every day | RESPIRATORY_TRACT | Status: DC
  Administered 2022-01-05 – 2022-01-12 (×2): 1 via RESPIRATORY_TRACT
  Filled 2022-01-03 (×3): qty 14

## 2022-01-03 MED ORDER — THIAMINE HCL 100 MG PO TABS: 100.0000 mg | ORAL_TABLET | Freq: Every day | ORAL | Status: DC

## 2022-01-03 MED ORDER — ONDANSETRON HCL 4 MG/2ML IJ SOLN: 4.0000 mg | Freq: Four times a day (QID) | INTRAMUSCULAR | Status: DC | PRN

## 2022-01-03 MED ORDER — PRAVASTATIN SODIUM 40 MG PO TABS
40.0000 mg | ORAL_TABLET | Freq: Every day | ORAL | Status: DC
  Administered 2022-01-03: 40 mg via ORAL
  Filled 2022-01-03: qty 1

## 2022-01-03 MED ORDER — POLYETHYLENE GLYCOL 3350 17 G PO PACK
17.0000 g | PACK | Freq: Every day | ORAL | Status: DC
  Administered 2022-01-04 – 2022-01-12 (×9): 17 g via ORAL
  Filled 2022-01-03 (×10): qty 1

## 2022-01-03 MED ORDER — HEPARIN SODIUM (PORCINE) 5000 UNIT/ML IJ SOLN
5000.0000 [IU] | Freq: Three times a day (TID) | INTRAMUSCULAR | Status: DC
  Administered 2022-01-03 – 2022-01-13 (×26): 5000 [IU] via SUBCUTANEOUS
  Filled 2022-01-03 (×28): qty 1

## 2022-01-03 MED ORDER — ACETAMINOPHEN 650 MG RE SUPP: 650.0000 mg | Freq: Four times a day (QID) | RECTAL | Status: DC | PRN

## 2022-01-03 MED ORDER — ONDANSETRON HCL 4 MG PO TABS: 4.0000 mg | ORAL_TABLET | Freq: Four times a day (QID) | ORAL | Status: DC | PRN

## 2022-01-03 MED ORDER — KCL IN DEXTROSE-NACL 20-5-0.45 MEQ/L-%-% IV SOLN
INTRAVENOUS | Status: DC
Start: 2022-01-03 — End: 2022-01-04
  Filled 2022-01-03 (×2): qty 1000

## 2022-01-03 MED ORDER — LORAZEPAM 2 MG/ML IJ SOLN: 0.0000 mg | Freq: Two times a day (BID) | INTRAMUSCULAR | Status: AC

## 2022-01-03 MED ORDER — ASPIRIN 81 MG PO CHEW
81.0000 mg | CHEWABLE_TABLET | Freq: Every day | ORAL | Status: DC
  Administered 2022-01-03 – 2022-01-13 (×11): 81 mg via ORAL
  Filled 2022-01-03 (×11): qty 1

## 2022-01-03 MED ORDER — ALBUTEROL SULFATE (2.5 MG/3ML) 0.083% IN NEBU
2.5000 mg | INHALATION_SOLUTION | Freq: Four times a day (QID) | RESPIRATORY_TRACT | Status: DC
  Administered 2022-01-04: 2.5 mg via RESPIRATORY_TRACT
  Filled 2022-01-03 (×3): qty 3

## 2022-01-03 MED ORDER — EMTRICITABINE-TENOFOVIR AF 200-25 MG PO TABS
1.0000 | ORAL_TABLET | Freq: Every day | ORAL | Status: DC
  Administered 2022-01-04 – 2022-01-08 (×5): 1 via ORAL
  Filled 2022-01-03 (×5): qty 1

## 2022-01-03 MED ORDER — CARVEDILOL 6.25 MG PO TABS
6.2500 mg | ORAL_TABLET | Freq: Two times a day (BID) | ORAL | Status: DC
  Administered 2022-01-04: 6.25 mg via ORAL
  Filled 2022-01-03: qty 2

## 2022-01-03 MED ORDER — CHLORDIAZEPOXIDE HCL 5 MG PO CAPS
20.0000 mg | ORAL_CAPSULE | Freq: Three times a day (TID) | ORAL | Status: DC
  Filled 2022-01-03: qty 4
  Filled 2022-01-03: qty 2
  Filled 2022-01-03: qty 4

## 2022-01-03 MED ORDER — SERTRALINE HCL 100 MG PO TABS
200.0000 mg | ORAL_TABLET | Freq: Every day | ORAL | Status: DC
  Administered 2022-01-04 – 2022-01-13 (×10): 200 mg via ORAL
  Filled 2022-01-03 (×10): qty 2

## 2022-01-03 MED ORDER — CLONIDINE HCL 0.1 MG PO TABS
0.1000 mg | ORAL_TABLET | Freq: Three times a day (TID) | ORAL | Status: DC
Start: 2022-01-03 — End: 2022-01-04
  Administered 2022-01-03: 0.1 mg via ORAL
  Filled 2022-01-03: qty 1

## 2022-01-03 MED ORDER — LORAZEPAM 1 MG PO TABS
0.0000 mg | ORAL_TABLET | Freq: Four times a day (QID) | ORAL | Status: AC
  Administered 2022-01-04: 2 mg via ORAL
  Administered 2022-01-04: 1 mg via ORAL
  Filled 2022-01-03: qty 2
  Filled 2022-01-03: qty 1

## 2022-01-03 MED ORDER — THIAMINE HCL 100 MG/ML IJ SOLN
500.0000 mg | Freq: Every day | INTRAVENOUS | Status: AC
  Administered 2022-01-04 – 2022-01-07 (×4): 500 mg via INTRAVENOUS
  Filled 2022-01-03 (×4): qty 5

## 2022-01-03 MED ORDER — THIAMINE HCL 100 MG/ML IJ SOLN
500.0000 mg | Freq: Once | INTRAMUSCULAR | Status: AC
  Administered 2022-01-03: 500 mg via INTRAVENOUS
  Filled 2022-01-03: qty 6

## 2022-01-03 MED ORDER — CHLORDIAZEPOXIDE HCL 10 MG PO CAPS
20.0000 mg | ORAL_CAPSULE | Freq: Three times a day (TID) | ORAL | Status: DC
  Administered 2022-01-03: 20 mg via ORAL
  Filled 2022-01-03 (×3): qty 4

## 2022-01-03 MED ORDER — ALBUTEROL SULFATE (2.5 MG/3ML) 0.083% IN NEBU: 3.0000 mL | INHALATION_SOLUTION | RESPIRATORY_TRACT | Status: DC | PRN

## 2022-01-03 MED ORDER — ACETAMINOPHEN 325 MG PO TABS: 650.0000 mg | ORAL_TABLET | Freq: Four times a day (QID) | ORAL | Status: DC | PRN

## 2022-01-03 MED ORDER — PANTOPRAZOLE SODIUM 40 MG PO TBEC
40.0000 mg | DELAYED_RELEASE_TABLET | Freq: Every day | ORAL | Status: DC
  Administered 2022-01-03 – 2022-01-13 (×11): 40 mg via ORAL
  Filled 2022-01-03 (×11): qty 1

## 2022-01-03 MED ORDER — IRBESARTAN 75 MG PO TABS
37.5000 mg | ORAL_TABLET | Freq: Every day | ORAL | Status: DC
Start: 2022-01-03 — End: 2022-01-05
  Administered 2022-01-04: 37.5 mg via ORAL
  Filled 2022-01-03 (×2): qty 0.5

## 2022-01-03 MED ORDER — THIAMINE HCL 100 MG PO TABS
100.0000 mg | ORAL_TABLET | Freq: Every day | ORAL | Status: DC
Start: 2022-01-09 — End: 2022-01-13
  Administered 2022-01-09 – 2022-01-13 (×5): 100 mg via ORAL
  Filled 2022-01-03 (×5): qty 1

## 2022-01-03 MED ORDER — THIAMINE HCL 100 MG/ML IJ SOLN
100.0000 mg | Freq: Every day | INTRAMUSCULAR | Status: DC
  Administered 2022-01-03: 100 mg via INTRAVENOUS
  Filled 2022-01-03: qty 2

## 2022-01-03 MED ORDER — LORAZEPAM 2 MG/ML IJ SOLN
0.0000 mg | Freq: Four times a day (QID) | INTRAMUSCULAR | Status: AC
  Administered 2022-01-03: 2 mg via INTRAVENOUS
  Administered 2022-01-03 – 2022-01-04 (×2): 1 mg via INTRAVENOUS
  Administered 2022-01-05: 2 mg via INTRAVENOUS
  Filled 2022-01-03 (×4): qty 1

## 2022-01-03 MED ORDER — HYDROXYZINE HCL 25 MG PO TABS
50.0000 mg | ORAL_TABLET | Freq: Three times a day (TID) | ORAL | Status: DC | PRN
  Filled 2022-01-03: qty 2

## 2022-01-03 NOTE — ED Triage Notes (Signed)
Pt BIB [facility] after neighbor called for a fall. Patient reports increased confusion and weakness x3 days. EMS reports patient was sitting upon their arrival, oriented to place, person, and time. Neighbors reported that patient is typically ambulatory and independent.  ?

## 2022-01-03 NOTE — ED Notes (Signed)
Patient observed to be sitting in the chair next to the bed. Patient reports that he is at the nurses house, not in the hospital. Patient reoriented patient to environment and assisted back to bed. Patient remains in bed at this time. ?

## 2022-01-03 NOTE — ED Notes (Signed)
Assisted patient to use urinal. Patient reports unable to urinate at this time. Patient verbalized understanding that in and out catheterization would be needed. ?
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
## 2022-01-03 NOTE — ED Notes (Signed)
Patient roused only during I/O catheterization. Patient remains sedated and is refusing PO medications. ?

## 2022-01-03 NOTE — Telephone Encounter (Signed)
Lurene Shadow [facility]) is calling very concerned about the pt. Pt is currently in the ER and she received a call stating that he didn't  know why he was there. Nurse reports pt states dont call my son and he doesn't have a son. Neighbors called EMS stating that he kept falling. Pt advised HPOA that he was recently Dx of early stages of Dementia. Last week he seemed different she reported asking same questions.  Reports pt not taking care of himself his appt is in a disarray.  ? ?HPOA states pt told her that he started a new alzheimer medication but she doesn't know the name. She reports that pt has lost weight in the last 2 weeks.  ? ?Please advise.  ?Mardene Celeste [phone] ? ? ? ?

## 2022-01-03 NOTE — ED Provider Notes (Signed)
?[facility] ?Provider Note ? ? ?CSN: [id number] ?Arrival date & time: 01/03/22  1229 ? ?  ? ?History ? ?Chief Complaint  ?Patient presents with  ? Altered Mental Status  ? Weakness  ? ? ?[name] is a 61 y.o. male with a past medical history of diabetes, depression, anxiety, PE, alcohol use disorder, brought in by EMS after they were called by a neighbor.  Neighbors apparently reported that the patient has had multiple falls recently.  Patient alert to self but disoriented to time and situation.   ? ? ?I got in touch with patient's best friend who reported that she was also his healthcare power of attorney.  She reports that she visited him 3 weeks ago after she was unable to get in touch with him because she usually speaks with him twice a day.  When he visited she said he did not seem quite like himself.  He saw a neurologist for memory problems and he told her that he was starting medication for early Alzheimer's.  When I asked the patient why he is here he reports "alcohol."  Also endorsing problems with his vision and epigastric discomfort. ? ?  ? ?Home Medications ?Prior to Admission medications   ?Medication Sig Start Date End Date Taking? Authorizing Provider  ?acetaminophen (TYLENOL) 500 MG tablet Take 1,000 mg by mouth 2 (two) times daily as needed for moderate pain.    [provider]  ?albuterol (PROVENTIL) (2.5 MG/3ML) 0.083% nebulizer solution Take 3 mLs (2.5 mg total) by nebulization every 6 (six) hours as needed for wheezing or shortness of breath. 04/21/21   [name], MD  ?albuterol (VENTOLIN HFA) 108 (90 Base) MCG/ACT inhaler Inhale 2 puffs into the lungs every 4 (four) hours as needed for wheezing. 09/14/21   [name], MD  ?[name] 62.5-25 MCG/ACT AEPB Inhale 1 puff into the lungs daily. 11/02/21   [name], MD  ?apixaban (ELIQUIS) 5 MG TABS tablet Take 2 tablets (10 mg total) by mouth 2 (two) times daily for 2 days.  08/04/21 08/06/21  Thurnell Lose, MD  ?apixaban (ELIQUIS) 5 MG TABS tablet Take 2 tablets (10 mg total) by mouth 2 (two) times daily for 2 days then take 1 tablet (5 mg total) by mouth 2 (two) times daily. 08/07/21   Thurnell Lose, MD  ?ARIPiprazole (ABILIFY) 5 MG tablet TAKE ONE TABLET BY MOUTH EVERY DAY 12/20/21   Nwoko, Terese Door, PA  ?busPIRone (BUSPAR) 30 MG tablet Take 1 tablet (30 mg total) by mouth 2 (two) times daily. 12/20/21 12/20/22  Malachy Mood, PA  ?carvedilol (COREG) 6.25 MG tablet Take 1 tablet (6.25 mg total) by mouth 2 (two) times daily with a meal. 02/18/21   [name], MD  ?cetirizine (ZYRTEC) 10 MG tablet Take 1 tablet (10 mg total) by mouth daily. 09/14/21   [name], MD  ?DESCOVY 200-25 MG tablet Take 1 tablet by mouth daily. 04/12/21   [name], MD  ?fluticasone (FLONASE) 50 MCG/ACT nasal spray Place 2 sprays into both nostrils daily. 08/05/21   [name], MD  ?folic acid (FOLVITE) 1 MG tablet Take 1 tablet (1 mg total) by mouth daily. 08/05/21   Thurnell Lose, MD  ?hydrOXYzine (ATARAX) 50 MG tablet Take 1 tablet (50 mg total) by mouth 3 (three) times daily as needed for anxiety. 11/16/21   Malachy Mood, PA  ?Multiple Vitamin (MULTIVITAMIN WITH  MINERALS) TABS tablet Take 1 tablet by mouth daily. 05/20/20   Connye Burkitt, NP  ?pantoprazole (PROTONIX) 40 MG tablet Take 1 tablet (40 mg total) by mouth daily. 08/05/21   Thurnell Lose, MD  ?pravastatin (PRAVACHOL) 40 MG tablet Take 1 tablet (40 mg total) by mouth daily. 02/18/21   [name], MD  ?sacubitril-valsartan (ENTRESTO) 24-26 MG Take 1 tablet by mouth 2 (two) times daily. 07/12/21 07/12/22  Jerline Pain, MD  ?sertraline (ZOLOFT) 100 MG tablet Take 2 tablets (200 mg total) by mouth daily. 11/02/21   Nwoko, Terese Door, PA  ?thiamine 100 MG tablet Take 0.5 tablets (50 mg total) by mouth daily. 08/04/21   Thurnell Lose, MD  ?traZODone (DESYREL) 100 MG tablet Take 3 tablets (300 mg total) by  mouth at bedtime. 11/02/21   Malachy Mood, PA  ?   ? ?Allergies    ?Ace inhibitors, Aspirin, Clopidogrel bisulfate, Crestor [rosuvastatin calcium], Metformin and related, and Rosuvastatin   ? ?Review of Systems   ?Review of Systems  ?Neurological:  Positive for weakness.  ? ?Physical Exam ?Updated Vital Signs ?SpO2 98%  ?Physical Exam ?Vitals and nursing note reviewed.  ?Constitutional:   ?   General: He is not in acute distress. ?   Appearance: Normal appearance. He is ill-appearing (Unkempt).  ?HENT:  ?   Head: Normocephalic and atraumatic.  ?   Mouth/Throat:  ?   Mouth: Mucous membranes are dry.  ?   Pharynx: Oropharynx is clear.  ?Eyes:  ?   General: No scleral icterus.    ?   Right eye: No discharge.     ?   Left eye: No discharge.  ?   Comments: Bilateral erythematous conjunctivae.  Pupils pinpoint and nonreactive to light.  Patient unable to perform EOM exam  ?Cardiovascular:  ?   Rate and Rhythm: Normal rate and regular rhythm.  ?Pulmonary:  ?   Effort: Pulmonary effort is normal. No respiratory distress.  ?Abdominal:  ?   General: Abdomen is flat.  ?   Palpations: Abdomen is soft.  ?Skin: ?   General: Skin is warm and dry.  ?   Findings: No bruising or rash.  ?Neurological:  ?   Mental Status: He is alert.  ?   Motor: Weakness present.  ?   Comments: Oriented to self.  4/5 strength to left sided finger grip.  Normal range of motion of all extremities.  Noticeable tremor when patient flexes his shoulders and extends his elbows.  ?Psychiatric:     ?   Mood and Affect: Mood normal.  ? ? ?ED Results / Procedures / Treatments   ?Labs ?(all labs ordered are listed, but only abnormal results are displayed) ?Labs Reviewed  ?COMPREHENSIVE METABOLIC PANEL - Abnormal; Notable for the following components:  ?    Result Value  ? Glucose, Bld 143 (*)   ? BUN 29 (*)   ? AST 115 (*)   ? ALT 207 (*)   ? Total Bilirubin 2.3 (*)   ? All other components within normal limits  ?CBC - Abnormal; Notable for the following  components:  ? Hemoglobin 18.0 (*)   ? HCT 52.2 (*)   ? Platelets 119 (*)   ? All other components within normal limits  ?CBG MONITORING, ED - Abnormal; Notable for the following components:  ? Glucose-Capillary 145 (*)   ? All other components within normal limits  ?ETHANOL  ?AMMONIA  ?RAPID URINE DRUG SCREEN,  HOSP PERFORMED  ?MAGNESIUM  ?COMPREHENSIVE METABOLIC PANEL  ?CBC WITH DIFFERENTIAL/PLATELET  ?BRAIN NATRIURETIC PEPTIDE  ?VITAMIN B1  ?VITAMIN B12  ?TSH  ?RPR  ?HIV ANTIBODY (ROUTINE TESTING W REFLEX)  ?CBC  ?CREATININE, SERUM  ? ? ?EKG ?None ? ?Radiology ?CT Head Wo Contrast ? ?Result Date: 01/03/2022 ?CLINICAL DATA:  Neurological deficit EXAM: CT HEAD WITHOUT CONTRAST TECHNIQUE: Contiguous axial images were obtained from the base of the skull through the vertex without intravenous contrast. RADIATION DOSE REDUCTION: This exam was performed according to the departmental dose-optimization program which includes automated exposure control, adjustment of the mA and/or kV according to patient size and/or use of iterative reconstruction technique. COMPARISON:  07/31/2021 FINDINGS: Brain: No acute intracranial findings are seen. There are no signs of bleeding. Cortical sulci are prominent, more so in the left cerebral hemisphere. There is decreased density in the periventricular white matter. There are no definite signs of bleeding within the cranium. Vascular: There are scattered arterial calcifications. Skull: Unremarkable. Sinuses/Orbits: Unremarkable. Other: None IMPRESSION: No acute intracranial findings are seen in noncontrast CT brain. Atrophy. Small-vessel disease. Electronically Signed   By: Elmer Picker M.D.   On: 01/03/2022 14:24  ? ?DG Chest Portable 1 View ? ?Result Date: 01/03/2022 ?CLINICAL DATA:  Shortness of breath. EXAM: PORTABLE CHEST 1 VIEW COMPARISON:  08/13/2021 FINDINGS: Stable chronic lung disease and evidence of prior right upper lobectomy. There is no evidence of pulmonary edema,  consolidation, pneumothorax or pleural fluid. Visualized bony structures are unremarkable. IMPRESSION: Stable postoperative appearance after prior right upper lobectomy. Electronically Signed   By: Aletta Edouard

## 2022-01-04 ENCOUNTER — Encounter (HOSPITAL_COMMUNITY): Payer: Self-pay | Admitting: Internal Medicine

## 2022-01-04 ENCOUNTER — Inpatient Hospital Stay (HOSPITAL_COMMUNITY): Payer: Medicare Other

## 2022-01-04 DIAGNOSIS — R4182 Altered mental status, unspecified: Secondary | ICD-10-CM | POA: Diagnosis not present

## 2022-01-04 LAB — COMPREHENSIVE METABOLIC PANEL
ALT: 213 U/L — ABNORMAL HIGH (ref 0–44)
AST: 128 U/L — ABNORMAL HIGH (ref 15–41)
Albumin: 4 g/dL (ref 3.5–5.0)
Alkaline Phosphatase: 54 U/L (ref 38–126)
Anion gap: 10 (ref 5–15)
BUN: 29 mg/dL — ABNORMAL HIGH (ref 8–23)
CO2: 27 mmol/L (ref 22–32)
Calcium: 9.7 mg/dL (ref 8.9–10.3)
Chloride: 104 mmol/L (ref 98–111)
Creatinine, Ser: 1.02 mg/dL (ref 0.61–1.24)
GFR, Estimated: >60 mL/min (ref >60)
Glucose, Bld: 84 mg/dL (ref 70–99)
Potassium: 3.6 mmol/L (ref 3.5–5.1)
Sodium: 141 mmol/L (ref 135–145)
Total Bilirubin: 2.6 mg/dL — ABNORMAL HIGH (ref 0.3–1.2)
Total Protein: 7.1 g/dL (ref 6.5–8.1)

## 2022-01-04 LAB — BRAIN NATRIURETIC PEPTIDE: B Natriuretic Peptide: 37.6 pg/mL (ref 0.0–100.0)

## 2022-01-04 LAB — CBC WITH DIFFERENTIAL/PLATELET
Abs Immature Granulocytes: 0.05 10*3/uL (ref 0.00–0.07)
Basophils Absolute: 0.1 10*3/uL (ref 0.0–0.1)
Basophils Relative: 1 %
Eosinophils Absolute: 0.2 10*3/uL (ref 0.0–0.5)
Eosinophils Relative: 2 %
HCT: 50.8 % (ref 39.0–52.0)
Hemoglobin: 17.9 g/dL — ABNORMAL HIGH (ref 13.0–17.0)
Immature Granulocytes: 1 %
Lymphocytes Relative: 31 %
Lymphs Abs: 2.5 10*3/uL (ref 0.7–4.0)
MCH: 32.3 pg (ref 26.0–34.0)
MCHC: 35.2 g/dL (ref 30.0–36.0)
MCV: 91.7 fL (ref 80.0–100.0)
Monocytes Absolute: 1 10*3/uL (ref 0.1–1.0)
Monocytes Relative: 12 %
Neutro Abs: 4.3 10*3/uL (ref 1.7–7.7)
Neutrophils Relative %: 53 %
Platelets: 107 10*3/uL — ABNORMAL LOW (ref 150–400)
RBC: 5.54 MIL/uL (ref 4.22–5.81)
RDW: 14.6 % (ref 11.5–15.5)
WBC: 8 10*3/uL (ref 4.0–10.5)
nRBC: 0 % (ref 0.0–0.2)

## 2022-01-04 LAB — MAGNESIUM: Magnesium: 2.2 mg/dL (ref 1.7–2.4)

## 2022-01-04 LAB — RPR: RPR Ser Ql: NONREACTIVE

## 2022-01-04 MED ORDER — KCL IN DEXTROSE-NACL 20-5-0.45 MEQ/L-%-% IV SOLN: INTRAVENOUS | Status: DC

## 2022-01-04 MED ORDER — ALBUTEROL SULFATE (2.5 MG/3ML) 0.083% IN NEBU
2.5000 mg | INHALATION_SOLUTION | Freq: Two times a day (BID) | RESPIRATORY_TRACT | Status: DC
  Administered 2022-01-05 – 2022-01-07 (×3): 2.5 mg via RESPIRATORY_TRACT
  Filled 2022-01-04 (×4): qty 3

## 2022-01-04 MED ORDER — CLONIDINE HCL 0.1 MG PO TABS: 0.1000 mg | ORAL_TABLET | Freq: Three times a day (TID) | ORAL | Status: DC | PRN

## 2022-01-04 MED ORDER — TRAMADOL HCL 50 MG PO TABS
50.0000 mg | ORAL_TABLET | Freq: Two times a day (BID) | ORAL | Status: DC | PRN
  Administered 2022-01-07 – 2022-01-12 (×4): 50 mg via ORAL
  Filled 2022-01-04 (×4): qty 1

## 2022-01-04 MED ORDER — CHLORDIAZEPOXIDE HCL 5 MG PO CAPS
15.0000 mg | ORAL_CAPSULE | Freq: Three times a day (TID) | ORAL | Status: DC
  Administered 2022-01-04 (×2): 15 mg via ORAL
  Filled 2022-01-04 (×2): qty 3

## 2022-01-04 NOTE — Evaluation (Signed)
Physical Therapy Evaluation ?Patient Details ?Name: [name] ?MRN: [id number] ?DOB: 1961/03/03 ?Today's Date: 01/04/2022 ? ?History of Present Illness ? 61 y.o. male admitted 4/10 who presented to the hospital after neighbors called EMS as they were concerned that patient was getting more confused and was having repeated falls at home.  PMH: ongoing  alcohol abuse, hypertension, COPD, paranoid schizophrenia, depression/anxiety, nonischemic cardiomyopathy, tobacco abuse, CAD, CHF, noncompliance with medications, recent small incidental PE in Nov 2022  ?Clinical Impression ? Pt admitted with above diagnosis. Pt was able to ambulate with PT but needs min assist for safety due to poor safety awareness, decr balance and impulsivity. Will benefit from PT.   Pt currently with functional limitations due to the deficits listed below (see PT Problem List). Pt will benefit from skilled PT to increase their independence and safety with mobility to allow discharge to the venue listed below.      ?   ? ?Recommendations for follow up therapy are one component of a multi-disciplinary discharge planning process, led by the attending physician.  Recommendations may be updated based on patient status, additional functional criteria and insurance authorization. ? ?Follow Up Recommendations Home health PT (if AMS clears; if it doesnt may need short term SNF) ? ?  ?Assistance Recommended at Discharge Intermittent Supervision/Assistance  ?Patient can return home with the following ? A little help with walking and/or transfers;A little help with bathing/dressing/bathroom ? ?  ?Equipment Recommendations None recommended by PT  ?Recommendations for Other Services ?    ?  ?Functional Status Assessment Patient has had a recent decline in their functional status and demonstrates the ability to make significant improvements in function in a reasonable and predictable amount of time.  ? ?  ?Precautions / Restrictions Precautions ?Precautions:  Fall ?Restrictions ?Weight Bearing Restrictions: No  ? ?  ? ?Mobility ? Bed Mobility ?Overal bed mobility: Needs Assistance ?Bed Mobility: Supine to Sit ?  ?  ?Supine to sit: Supervision ?  ?  ?General bed mobility comments: supervision for safety as he is impulsive ?  ? ?Transfers ?Overall transfer level: Needs assistance ?Equipment used: Rolling walker (2 wheels) ?Transfers: Sit to/from Stand ?Sit to Stand: Min guard ?  ?  ?  ?  ?  ?General transfer comment: guard assist for safety ?  ? ?Ambulation/Gait ?Ambulation/Gait assistance: Min assist ?Gait Distance (Feet): 250 Feet ?Assistive device: Rolling walker (2 wheels) ?Gait Pattern/deviations: Step-through pattern, Decreased stride length, Decreased step length - right, Decreased stance time - right, Decreased weight shift to right, Drifts right/left, Staggering right ?  ?Gait velocity interpretation: <1.8 ft/sec, indicate of risk for recurrent falls ?  ?General Gait Details: Pt was able to ambulate in ED but needed cues at times for safety awareness as he would run his RW into objects at times as well as moves impulsively therefore gets off balance at times.  Needed min assist for safety. ? ?Stairs ?  ?  ?  ?  ?  ? ?Wheelchair Mobility ?  ? ?Modified Rankin (Stroke Patients Only) ?  ? ?  ? ?Balance Overall balance assessment: Needs assistance ?Sitting-balance support: No upper extremity supported, Feet supported ?Sitting balance-Leahy Scale: Fair ?  ?  ?Standing balance support: Bilateral upper extremity supported, During functional activity ?Standing balance-Leahy Scale: Poor ?Standing balance comment: relies on UE support and external supoprt aas well ?  ?  ?  ?  ?  ?  ?  ?  ?  ?  ?  ?   ? ? ? ?  Pertinent Vitals/Pain Pain Assessment ?Pain Assessment: No/denies pain  ? ? ?Home Living Family/patient expects to be discharged to:: Private residence ?Living Arrangements: Alone ?Available Help at Discharge: Friend(s);Available PRN/intermittently;Family ?Type of Home:  Apartment ?Home Access: Stairs to enter ?Entrance Stairs-Rails: Right;Left;Can reach both ?Entrance Stairs-Number of Steps: 4 ?  ?Home Layout: One level ?Home Equipment: Conservation officer, nature (2 wheels);Rollator (4 wheels);Cane - single point;Wheelchair - manual;Tub bench ?   ?  ?Prior Function Prior Level of Function : Independent/Modified Independent ?  ?  ?  ?  ?  ?  ?Mobility Comments: amb with cane vs rollator, family/friends drive him to the grocery store, he uses motorized cart in store ?  ?  ? ? ?Hand Dominance  ?   ? ?  ?Extremity/Trunk Assessment  ? Upper Extremity Assessment ?Upper Extremity Assessment: Defer to OT evaluation ?  ? ?Lower Extremity Assessment ?Lower Extremity Assessment: Generalized weakness;RLE deficits/detail;LLE deficits/detail ?RLE Coordination: decreased gross motor ?LLE Coordination: decreased gross motor ?  ? ?   ?Communication  ? Communication: No difficulties  ?Cognition Arousal/Alertness: Awake/alert ?Behavior During Therapy: Impulsive ?Overall Cognitive Status: Impaired/Different from baseline ?Area of Impairment: Orientation, Attention, Memory, Following commands, Safety/judgement, Awareness, Problem solving ?  ?  ?  ?  ?  ?  ?  ?  ?Orientation Level: Disoriented to, Time, Situation, Place ?Current Attention Level: Focused ?Memory: Decreased recall of precautions, Decreased short-term memory ?Following Commands: Follows one step commands consistently ?Safety/Judgement: Decreased awareness of safety, Decreased awareness of deficits ?  ?Problem Solving: Difficulty sequencing, Requires verbal cues ?  ?  ?  ? ?  ?General Comments   ? ?  ?Exercises    ? ?Assessment/Plan  ?  ?PT Assessment Patient needs continued PT services  ?PT Problem List Decreased activity tolerance;Decreased balance;Decreased mobility;Decreased knowledge of use of DME;Decreased safety awareness;Decreased knowledge of precautions ? ?   ?  ?PT Treatment Interventions DME instruction;Gait training;Functional mobility  training;Stair training;Therapeutic activities;Therapeutic exercise;Balance training;Patient/family education   ? ?PT Goals (Current goals can be found in the Care Plan section)  ?Acute Rehab PT Goals ?Patient Stated Goal: to go home ?PT Goal Formulation: With patient ?Time For Goal Achievement: 01/18/22 ?Potential to Achieve Goals: Good ? ?  ?Frequency Min 3X/week ?  ? ? ?Co-evaluation   ?  ?  ?  ?  ? ? ?  ?AM-PAC PT "6 Clicks" Mobility  ?Outcome Measure Help needed turning from your back to your side while in a flat bed without using bedrails?: None ?Help needed moving from lying on your back to sitting on the side of a flat bed without using bedrails?: A Little ?Help needed moving to and from a bed to a chair (including a wheelchair)?: A Little ?Help needed standing up from a chair using your arms (e.g., wheelchair or bedside chair)?: A Little ?Help needed to walk in hospital room?: A Little ?Help needed climbing 3-5 steps with a railing? : A Lot ?6 Click Score: 18 ? ?  ?End of Session Equipment Utilized During Treatment: Gait belt ?Activity Tolerance: Patient limited by fatigue ?Patient left: with call bell/phone within reach;with family/visitor present (on strecher) ?Nurse Communication: Mobility status ?PT Visit Diagnosis: Unsteadiness on feet (R26.81) ?  ? ?Time: 6213-0865 ?PT Time Calculation (min) (ACUTE ONLY): 31 min ? ? ?Charges:   PT Evaluation ?$PT Eval Moderate Complexity: 1 Mod ?PT Treatments ?$Gait Training: 8-22 mins ?  ?   ? ? ?Taziyah Iannuzzi M,PT ?Acute Rehab Services ?[phone] ?[phone] (pager)  ? ?Rayquan Amrhein F Gurinder Toral ?  01/04/2022, 12:22 PM ?

## 2022-01-04 NOTE — Progress Notes (Addendum)
?                                  PROGRESS NOTE                                             ?                                                                                                                     ?                                         ? ? Patient Demographics:  ? ? [name], is a 61 y.o. male, DOB - 1960/10/11, RDE:[id number] ? ?Outpatient Primary MD for the patient is [name], MD    LOS - 1  Admit date - 01/03/2022   ? ?Chief Complaint  ?Patient presents with  ? Altered Mental Status  ? Weakness  ?    ? ?Brief Narrative (HPI from H&P)    61 y.o. male with medical history significant for ongoing alcohol abuse, hypertension, COPD, paranoid schizophrenia, depression/anxiety, nonischemic cardiomyopathy, tobacco abuse, CAD, CHF, noncompliance with medications, recent small incidental PE in Nov 2022, who presented to the hospital after neighbors called EMS as they were concerned that patient was getting more confused and was having repeated falls at home. ? ? Subjective:  ? ? [name] today has, No headache, No chest pain, No abdominal pain - No Nausea, No new weakness tingling or numbness, no SOB ? ? Assessment  & Plan :  ? ?History of alcohol abuse, currently in DTs with multiple falls at home.  Head CT unremarkable, no focal deficits, placed on Librium along with CIWA protocol, high-dose IV thiamine for 5 days.  Counseled to quit alcohol.  Monitor closely, with supportive care DTs seems to be stable.  Will advance activity, PT OT today. ?  ?2.  Unsafe living conditions.  Social work input ?  ?3.  History of paranoid schizophrenia, depression - currently no suicidal ideation.  Continue Zoloft. ?  ?4.  History of nonischemic cardiomyopathy chronic systolic heart failure EF around 40%.  Currently appears compensated.  Continue Coreg and ARB while here. ?  ?5.  Dyslipidemia.  Holding statin due to #13 below. ?  ?6. COPD.  Stable no acute issues.  Supportive  care. ?  ?7.  Stray of COVID-19 infection.  Supportive care. ?  ?8. Stage IA (T1a, N0, M0) non-small cell right upper lobe lung cancer, adenocarcinoma diagnosed in November 2016 ?S/P wedge resection right upper lobe nodule, and thoracoscopic right upper lobectomy under the care of Dr. Roxan Hockey on 09/04/2015.  PCP to monitor. ?  ?9.  HIV.  Continue home medications . ?  ?10.  HTN.  On beta-blocker and placed on ARB. ?  ?11.  PAD.  Continue beta-blocker, statin and start aspirin for secondary prevention. ?  ?12.  Incidental small PE in November 2022.  No further anticoagulation due to ongoing alcohol abuse and multiple falls.  Risks outweigh benefit. ? ?13.  Mild asymptomatic transaminitis.  Hold statin, check right upper quadrant ultrasound along with hepatitis panel and monitor. ? ?   ? ?Condition - Fair ? ?Family Communication  :  None present ? ?Code Status :  Full ? ?Consults  :  None ? ?PUD Prophylaxis : PPI ? ? Procedures  :    ? ?CT Head - Non acute ? ?RUQ [place] -  ? ?   ? ?Disposition Plan  :   ? ?Status is: Inpatient ? ?DVT Prophylaxis  :   ? ?heparin injection 5,000 Units Start: 01/03/22 1730 ?  ? ?Lab Results  ?Component Value Date  ? PLT 107 (L) 01/04/2022  ? ? ?Diet :  ?Diet Order   ? ?       ?  DIET SOFT Room service appropriate? Yes; Fluid consistency: Nectar Thick  Diet effective now       ?  ? ?  ?  ? ?  ?  ? ?Inpatient Medications ? ?Scheduled Meds: ? albuterol  2.5 mg Nebulization Q6H  ? aspirin  81 mg Oral Daily  ? carvedilol  6.25 mg Oral BID WC  ? chlordiazePOXIDE  15 mg Oral TID  ? emtricitabine-tenofovir AF  1 tablet Oral Daily  ? heparin  5,000 Units Subcutaneous Q8H  ? irbesartan  37.5 mg Oral Daily  ? LORazepam  0-4 mg Intravenous Q6H  ? Or  ? LORazepam  0-4 mg Oral Q6H  ? [START ON 01/05/2022] LORazepam  0-4 mg Intravenous Q12H  ? Or  ? [START ON 01/05/2022] LORazepam  0-4 mg Oral Q12H  ? pantoprazole  40 mg Oral Daily  ? polyethylene glycol  17 g Oral Daily  ? sertraline  200 mg Oral Daily   ? [START ON 01/09/2022] thiamine  100 mg Oral Daily  ? umeclidinium-vilanterol  1 puff Inhalation QAC breakfast  ? ?Continuous Infusions: ? thiamine injection    ? ?PRN Meds:.albuterol, cloNIDine, hydrOXYzine, ondansetron **OR** ondansetron (ZOFRAN) IV, sodium phosphate, traMADol ? ?Antibiotics  :   ? ?Anti-infectives (From admission, onward)  ? ? Start     Dose/Rate Route Frequency Ordered Stop  ? 01/03/22 1745  emtricitabine-tenofovir AF (DESCOVY) 200-25 MG per tablet 1 tablet       ? 1 tablet Oral Daily 01/03/22 1738    ? ?  ? ? ? Time Spent in minutes  30 ? ? ?Lala Lund M.D on 01/04/2022 at 9:05 AM ? ?To page go to [email or web address]  ? ?[facility] -  Office  [phone] ? ?See all Orders from today for further details ? ? ? Objective:  ? ?Vitals:  ? 01/04/22 0549 01/04/22 5852 01/04/22 0718 01/04/22 0828  ?BP:  106/76 106/76   ?Pulse:  90 90   ?Resp:  18    ?Temp:      ?TempSrc:      ?SpO2: 98% 98%    ?Weight:    81.6 kg  ?Height:    6\' 2"  (1.88 m)  ? ? ?Wt Readings from Last 3 Encounters:  ?01/04/22 81.6 kg  ?08/04/21 86.9 kg  ?07/07/21 86.2 kg  ? ? ?No intake or output data  in the 24 hours ending 01/04/22 0905 ? ? ?Physical Exam ? ?Awake but mildly confused, No new F.N deficits, stable mild DTs ?[place].AT,PERRAL ?Supple Neck, No JVD,   ?Symmetrical Chest wall movement, Good air movement bilaterally, CTAB ?RRR,No Gallops,Rubs or new Murmurs,  ?+ve B.Sounds, Abd Soft, No tenderness,   ?No Cyanosis, Clubbing or edema  ?  ?  ? ? Data Review:  ? ? ?CBC ?Recent Labs  ?Lab 01/03/22 ?1415 01/04/22 ?0448  ?WBC 10.3 8.0  ?HGB 18.0* 17.9*  ?HCT 52.2* 50.8  ?PLT 119* 107*  ?MCV 91.4 91.7  ?MCH 31.5 32.3  ?MCHC 34.5 35.2  ?RDW 14.5 14.6  ?LYMPHSABS  --  2.5  ?MONOABS  --  1.0  ?EOSABS  --  0.2  ?BASOSABS  --  0.1  ? ? ?Electrolytes ?Recent Labs  ?Lab 01/03/22 ?1415 01/03/22 ?1758 01/04/22 ?0448  ?NA 139  --  141  ?K 3.7  --  3.6  ?CL 102  --  104  ?CO2 22  --  27  ?GLUCOSE 143*  --  84  ?BUN 29*  --  29*  ?CREATININE  1.06 1.11 1.02  ?CALCIUM 9.9  --  9.7  ?AST 115*  --  128*  ?ALT 207*  --  213*  ?ALKPHOS 55  --  54  ?BILITOT 2.3*  --  2.6*  ?ALBUMIN 4.3  --  4.0  ?MG  --   --  2.2  ?TSH  --  1.738  --   ?AMMONIA 23  --   --   ?BNP  --   --  37.6  ? ? ?------------------------------------------------------------------------------------------------------------------ ?No results for input(s): CHOL, HDL, LDLCALC, TRIG, CHOLHDL, LDLDIRECT in the last 72 hours. ? ?Lab Results  ?Component Value Date  ? HGBA1C 6.1 (H) 07/31/2021  ? ? ?Recent Labs  ?  01/03/22 ?5726  ?TSH 1.738  ? ?  ? ?Radiology Reports ?CT Head Wo Contrast ? ?Result Date: 01/03/2022 ?CLINICAL DATA:  Neurological deficit EXAM: CT HEAD WITHOUT CONTRAST TECHNIQUE: Contiguous axial images were obtained from the base of the skull through the vertex without intravenous contrast. RADIATION DOSE REDUCTION: This exam was performed according to the departmental dose-optimization program which includes automated exposure control, adjustment of the mA and/or kV according to patient size and/or use of iterative reconstruction technique. COMPARISON:  07/31/2021 FINDINGS: Brain: No acute intracranial findings are seen. There are no signs of bleeding. Cortical sulci are prominent, more so in the left cerebral hemisphere. There is decreased density in the periventricular white matter. There are no definite signs of bleeding within the cranium. Vascular: There are scattered arterial calcifications. Skull: Unremarkable. Sinuses/Orbits: Unremarkable. Other: None IMPRESSION: No acute intracranial findings are seen in noncontrast CT brain. Atrophy. Small-vessel disease. Electronically Signed   By: Elmer Picker M.D.   On: 01/03/2022 14:24  ? ?DG Chest Portable 1 View ? ?Result Date: 01/03/2022 ?CLINICAL DATA:  Shortness of breath. EXAM: PORTABLE CHEST 1 VIEW COMPARISON:  08/13/2021 FINDINGS: Stable chronic lung disease and evidence of prior right upper lobectomy. There is no evidence  of pulmonary edema, consolidation, pneumothorax or pleural fluid. Visualized bony structures are unremarkable. IMPRESSION: Stable postoperative appearance after prior right upper lobectomy. [employer]

## 2022-01-04 NOTE — ED Notes (Signed)
Pt is alert to self and dob.  ? ?He believes he is at [facility]. Pt is unsure of the year. Pt is cooperative and calm. ?

## 2022-01-04 NOTE — Progress Notes (Signed)
NEW ADMISSION NOTE ?New Admission Note:  ? ?Arrival Method: E.D. Bobbye Riggs ?Mental Orientation: Alert to self and place. ?Telemetry:#20 called and confirmed -NSR ?Assessment: Completed ?Skin:Intact but dry all over,assessed with Darylene Price R.N. ?LP:NPYYF A/C NSL ?Pain:Denies ?Tubes:None ?Safety Measures: Safety Fall Prevention Plan has been given, discussed and signed ?Admission: Completed ?[facility] Orientation: Patient has been orientated to the room, unit and staff.  ?Family:Health POA at the bedside. ? ?Orders have been reviewed and implemented. Will continue to monitor the patient. Call light has been placed within reach and bed alarm has been activated.  ? ?Rogelia Mire, RN   ?

## 2022-01-04 NOTE — ED Notes (Signed)
Patient transported to Ultrasound 

## 2022-01-04 NOTE — ED Notes (Signed)
Breakfast order placed ?

## 2022-01-04 NOTE — ED Notes (Signed)
RN assisted pt with lunch tray. Pt is eating food at the bedside.  ?

## 2022-01-04 NOTE — ED Notes (Signed)
A RN was passing by when she noticed pt had pulled IV out. Pt linens and gown was changed. New dressing was applied to pt arm.  ? ?

## 2022-01-05 DIAGNOSIS — Z21 Asymptomatic human immunodeficiency virus [HIV] infection status: Secondary | ICD-10-CM

## 2022-01-05 DIAGNOSIS — E785 Hyperlipidemia, unspecified: Secondary | ICD-10-CM | POA: Diagnosis not present

## 2022-01-05 DIAGNOSIS — R4182 Altered mental status, unspecified: Secondary | ICD-10-CM | POA: Diagnosis not present

## 2022-01-05 DIAGNOSIS — K7689 Other specified diseases of liver: Secondary | ICD-10-CM

## 2022-01-05 DIAGNOSIS — I739 Peripheral vascular disease, unspecified: Secondary | ICD-10-CM

## 2022-01-05 DIAGNOSIS — F200 Paranoid schizophrenia: Secondary | ICD-10-CM | POA: Diagnosis not present

## 2022-01-05 DIAGNOSIS — F10931 Alcohol use, unspecified with withdrawal delirium: Secondary | ICD-10-CM | POA: Diagnosis not present

## 2022-01-05 LAB — HEPATITIS PANEL, ACUTE
HCV Ab: NONREACTIVE
Hep A IgM: NONREACTIVE
Hep B C IgM: NONREACTIVE
Hepatitis B Surface Ag: NONREACTIVE

## 2022-01-05 LAB — CBC WITH DIFFERENTIAL/PLATELET
Abs Immature Granulocytes: 0.04 10*3/uL (ref 0.00–0.07)
Basophils Absolute: 0.1 10*3/uL (ref 0.0–0.1)
Basophils Relative: 1 %
Eosinophils Absolute: 0.2 10*3/uL (ref 0.0–0.5)
Eosinophils Relative: 2 %
HCT: 47 % (ref 39.0–52.0)
Hemoglobin: 17 g/dL (ref 13.0–17.0)
Immature Granulocytes: 1 %
Lymphocytes Relative: 33 %
Lymphs Abs: 2.6 10*3/uL (ref 0.7–4.0)
MCH: 32.4 pg (ref 26.0–34.0)
MCHC: 36.2 g/dL — ABNORMAL HIGH (ref 30.0–36.0)
MCV: 89.7 fL (ref 80.0–100.0)
Monocytes Absolute: 1 10*3/uL (ref 0.1–1.0)
Monocytes Relative: 13 %
Neutro Abs: 3.8 10*3/uL (ref 1.7–7.7)
Neutrophils Relative %: 50 %
Platelets: 109 10*3/uL — ABNORMAL LOW (ref 150–400)
RBC: 5.24 MIL/uL (ref 4.22–5.81)
RDW: 14.1 % (ref 11.5–15.5)
WBC: 7.7 10*3/uL (ref 4.0–10.5)
nRBC: 0 % (ref 0.0–0.2)

## 2022-01-05 LAB — COMPREHENSIVE METABOLIC PANEL
ALT: 192 U/L — ABNORMAL HIGH (ref 0–44)
AST: 94 U/L — ABNORMAL HIGH (ref 15–41)
Albumin: 3.8 g/dL (ref 3.5–5.0)
Alkaline Phosphatase: 52 U/L (ref 38–126)
Anion gap: 10 (ref 5–15)
BUN: 31 mg/dL — ABNORMAL HIGH (ref 8–23)
CO2: 22 mmol/L (ref 22–32)
Calcium: 9.5 mg/dL (ref 8.9–10.3)
Chloride: 105 mmol/L (ref 98–111)
Creatinine, Ser: 1.27 mg/dL — ABNORMAL HIGH (ref 0.61–1.24)
GFR, Estimated: >60 mL/min (ref >60)
Glucose, Bld: 115 mg/dL — ABNORMAL HIGH (ref 70–99)
Potassium: 3.7 mmol/L (ref 3.5–5.1)
Sodium: 137 mmol/L (ref 135–145)
Total Bilirubin: 1.8 mg/dL — ABNORMAL HIGH (ref 0.3–1.2)
Total Protein: 6.8 g/dL (ref 6.5–8.1)

## 2022-01-05 LAB — T-HELPER CELLS (CD4) COUNT (NOT AT ARMC)
CD4 % Helper T Cell: 55 % (ref 33–65)
CD4 T Cell Abs: 1246 /uL (ref 400–1790)

## 2022-01-05 LAB — BRAIN NATRIURETIC PEPTIDE: B Natriuretic Peptide: 46 pg/mL (ref 0.0–100.0)

## 2022-01-05 LAB — MAGNESIUM: Magnesium: 2.1 mg/dL (ref 1.7–2.4)

## 2022-01-05 MED ORDER — CHLORDIAZEPOXIDE HCL 5 MG PO CAPS
5.0000 mg | ORAL_CAPSULE | Freq: Three times a day (TID) | ORAL | Status: DC
  Administered 2022-01-05: 5 mg via ORAL
  Filled 2022-01-05: qty 1

## 2022-01-05 MED ORDER — TAMSULOSIN HCL 0.4 MG PO CAPS
0.4000 mg | ORAL_CAPSULE | Freq: Every day | ORAL | Status: DC
Start: 2022-01-05 — End: 2022-01-13
  Administered 2022-01-05 – 2022-01-13 (×9): 0.4 mg via ORAL
  Filled 2022-01-05 (×9): qty 1

## 2022-01-05 MED ORDER — HALOPERIDOL LACTATE 5 MG/ML IJ SOLN
2.0000 mg | Freq: Once | INTRAMUSCULAR | Status: AC
  Administered 2022-01-05: 2 mg via INTRAVENOUS
  Filled 2022-01-05: qty 1

## 2022-01-05 MED ORDER — LACTATED RINGERS IV SOLN: INTRAVENOUS | Status: AC

## 2022-01-05 MED ORDER — LORAZEPAM 2 MG/ML IJ SOLN: 4.0000 mg | Freq: Once | INTRAMUSCULAR | Status: DC

## 2022-01-05 MED ORDER — CARVEDILOL 3.125 MG PO TABS: 3.1250 mg | ORAL_TABLET | Freq: Two times a day (BID) | ORAL | Status: DC

## 2022-01-05 MED ORDER — CHLORDIAZEPOXIDE HCL 5 MG PO CAPS
10.0000 mg | ORAL_CAPSULE | Freq: Three times a day (TID) | ORAL | Status: DC
  Administered 2022-01-05 – 2022-01-06 (×3): 10 mg via ORAL
  Filled 2022-01-05 (×3): qty 2

## 2022-01-05 MED ORDER — BUSPIRONE HCL 5 MG PO TABS
30.0000 mg | ORAL_TABLET | Freq: Two times a day (BID) | ORAL | Status: DC
  Administered 2022-01-05 – 2022-01-13 (×17): 30 mg via ORAL
  Filled 2022-01-05 (×17): qty 6

## 2022-01-05 MED ORDER — CHLORDIAZEPOXIDE HCL 5 MG PO CAPS: 10.0000 mg | ORAL_CAPSULE | Freq: Three times a day (TID) | ORAL | Status: DC

## 2022-01-05 MED ORDER — CHLORHEXIDINE GLUCONATE CLOTH 2 % EX PADS
6.0000 | MEDICATED_PAD | Freq: Every day | CUTANEOUS | Status: DC
  Administered 2022-01-05 – 2022-01-08 (×4): 6 via TOPICAL

## 2022-01-05 MED ORDER — ARIPIPRAZOLE 5 MG PO TABS
5.0000 mg | ORAL_TABLET | Freq: Every day | ORAL | Status: DC
  Administered 2022-01-05: 5 mg via ORAL
  Filled 2022-01-05: qty 1

## 2022-01-05 MED ORDER — QUETIAPINE FUMARATE 50 MG PO TABS
50.0000 mg | ORAL_TABLET | Freq: Two times a day (BID) | ORAL | Status: DC
Start: 2022-01-05 — End: 2022-01-10
  Administered 2022-01-05 – 2022-01-09 (×9): 50 mg via ORAL
  Filled 2022-01-05 (×9): qty 1

## 2022-01-05 MED ORDER — QUETIAPINE FUMARATE 25 MG PO TABS
25.0000 mg | ORAL_TABLET | Freq: Two times a day (BID) | ORAL | Status: DC
Start: 2022-01-05 — End: 2022-01-05
  Administered 2022-01-05: 25 mg via ORAL
  Filled 2022-01-05: qty 1

## 2022-01-05 MED ORDER — CARVEDILOL 3.125 MG PO TABS
3.1250 mg | ORAL_TABLET | Freq: Two times a day (BID) | ORAL | Status: DC
  Administered 2022-01-06 – 2022-01-13 (×15): 3.125 mg via ORAL
  Filled 2022-01-05 (×15): qty 1

## 2022-01-05 NOTE — Evaluation (Signed)
Occupational Therapy Evaluation ?Patient Details ?Name: [name] ?MRN: [id number] ?DOB: 1961/03/26 ?Today's Date: 01/05/2022 ? ? ?History of Present Illness 61 y.o. male admitted 4/10 who presented to the hospital after neighbors called EMS as they were concerned that patient was getting more confused and was having repeated falls at home.  PMH: ongoing  alcohol abuse, hypertension, COPD, paranoid schizophrenia, depression/anxiety, nonischemic cardiomyopathy, tobacco abuse, CAD, CHF, noncompliance with medications, recent small incidental PE in Nov 2022  ? ?Clinical Impression ?  ?Patient is a 61 year old male who was living at home alone prior level. Currently, patient was noted to have continued AMS with orthostatic BP with attempts at sitting on edge of bed with increased level of assistance to participate in tasks compared to PT session previous day. Patient was able to communicate with therapist and nurse during all movement but noted to be nonsensical at times. Patient was immediately returned to supine with reports of dizziness with BLE elevated above head. Patient was noted to be more appropriate with communication when returned to supine. Nurse was present during session. Patient was noted to have decreased functional activity tolerance, decreased endurance, decreased sitting balance, decreased safety awareness, and increased pain impacting participation in ADLs. Patient would continue to benefit from skilled OT services at this time while admitted and after d/c to address noted deficits in order to improve overall safety and independence in ADLs.  ?  ?Blood pressures ?Supine 92/57 mmhg HR 84 bpm  ?Sitting edge of bed: 68/44 mmhg HR 88 bpm. Nurse in room and aware of Bps. ?   ? ?Recommendations for follow up therapy are one component of a multi-disciplinary discharge planning process, led by the attending physician.  Recommendations may be updated based on patient status, additional functional criteria  and insurance authorization.  ? ?Follow Up Recommendations ? Skilled nursing-short term rehab (<3 hours/day)  ?  ?Assistance Recommended at Discharge Frequent or constant Supervision/Assistance  ?Patient can return home with the following A lot of help with walking and/or transfers;A lot of help with bathing/dressing/bathroom;Direct supervision/assist for medications management;Help with stairs or ramp for entrance;Assist for transportation;Direct supervision/assist for financial management;Assistance with cooking/housework ? ?  ?Functional Status Assessment ? Patient has had a recent decline in their functional status and demonstrates the ability to make significant improvements in function in a reasonable and predictable amount of time.  ?Equipment Recommendations ? Other (comment) (defer to next venue)  ?  ?Recommendations for Other Services   ? ? ?  ?Precautions / Restrictions Precautions ?Precautions: Fall ?Precaution Comments: orthostatic ?Restrictions ?Weight Bearing Restrictions: No  ? ?  ? ?Mobility Bed Mobility ?Overal bed mobility: Needs Assistance ?Bed Mobility: Supine to Sit, Sit to Supine ?  ?  ?Supine to sit: Mod assist, HOB elevated ?Sit to supine: Mod assist ?  ?General bed mobility comments: with increased time and cues. ?  ? ?Transfers ?  ?  ?  ?  ?  ?  ?  ?  ?  ?  ?  ? ?  ?Balance Overall balance assessment: Needs assistance ?Sitting-balance support: Feet supported, Bilateral upper extremity supported ?Sitting balance-Leahy Scale: Poor ?Sitting balance - Comments: patient was noted to have slight posterior leaning to R side sitting on edge of bed. ?Postural control: Right lateral lean ?  ?  ?  ?  ?  ?  ?  ?  ?  ?  ?  ?  ?  ?  ?   ? ?ADL either performed or assessed  with clinical judgement  ? ?ADL Overall ADL's : Needs assistance/impaired ?Eating/Feeding: Minimal assistance;Bed level ?  ?Grooming: Dance movement psychotherapist;Moderate assistance;Sitting ?Grooming Details (indicate cue type and reason): unable to  attempt with low BP with attempted sitting on edge of bed. patient returned to supine ?Upper Body Bathing: Moderate assistance;Bed level ?  ?Lower Body Bathing: Bed level;Maximal assistance ?  ?Upper Body Dressing : Moderate assistance;Bed level ?  ?Lower Body Dressing: Maximal assistance;Bed level ?  ?Toilet Transfer: +2 for physical assistance;+2 for safety/equipment ?Toilet Transfer Details (indicate cue type and reason): unable to assess with patients BP dropping in sitting on edge of bed. patient returned to supine ?Toileting- Clothing Manipulation and Hygiene: Total assistance;Bed level ?  ?  ?  ?Functional mobility during ADLs: +2 for physical assistance;+2 for safety/equipment ?   ? ? ? ?Vision   ?Additional Comments: unable to assess with patients current COG level  ?   ?Perception   ?  ?Praxis   ?  ? ?Pertinent Vitals/Pain Pain Assessment ?Pain Assessment: Faces ?Faces Pain Scale: Hurts a little bit ?Pain Location: reports pain in bilateral feet ?Pain Descriptors / Indicators: Discomfort ?Pain Intervention(s): Monitored during session, Patient requesting pain meds-RN notified, Other (comment) (nurse in room during session)  ? ? ? ?Hand Dominance   ?  ?Extremity/Trunk Assessment Upper Extremity Assessment ?Upper Extremity Assessment: RUE deficits/detail;Difficult to assess due to impaired cognition;LUE deficits/detail ?LUE Deficits / Details: IV was leaking with nurse fixing dressing. ?  ?  ?  ?  ?  ?Communication Communication ?Communication: Expressive difficulties ?  ?Cognition Arousal/Alertness: Awake/alert ?Behavior During Therapy: Impulsive, Restless ?Overall Cognitive Status: Impaired/Different from baseline ?Area of Impairment: Orientation, Attention, Memory, Following commands, Safety/judgement, Awareness, Problem solving ?  ?  ?  ?  ?  ?  ?  ?  ?Orientation Level: Disoriented to, Time, Situation, Place ?  ?Memory: Decreased recall of precautions, Decreased short-term memory ?Following Commands:  Follows one step commands consistently ?Safety/Judgement: Decreased awareness of safety, Decreased awareness of deficits ?  ?Problem Solving: Difficulty sequencing, Requires verbal cues ?General Comments: reported it was 10 when aske dyear unable to state month. patient was confused during session with some nonsensical speech at times. at end of session, patietn was able to remember he was at [facility]. ?  ?  ?General Comments    ? ?  ?Exercises   ?  ?Shoulder Instructions    ? ? ?Home Living Family/patient expects to be discharged to:: Private residence ?Living Arrangements: Alone ?Available Help at Discharge: Friend(s);Available PRN/intermittently;Family ?Type of Home: Apartment ?Home Access: Stairs to enter ?Entrance Stairs-Number of Steps: 4 ?Entrance Stairs-Rails: Right;Left;Can reach both ?Home Layout: One level ?  ?  ?Bathroom Shower/Tub: Tub/shower unit ?  ?Bathroom Toilet: Standard ?  ?  ?Home Equipment: Conservation officer, nature (2 wheels);Rollator (4 wheels);Cane - single point;Wheelchair - manual;Tub bench ?  ?  ?  ? ?  ?Prior Functioning/Environment Prior Level of Function : Independent/Modified Independent ?  ?  ?  ?  ?  ?  ?  ?ADLs Comments: info taken from chart, patietn unable to give PLOF on this date. ?  ? ?  ?  ?OT Problem List: Decreased strength;Decreased activity tolerance;Impaired balance (sitting and/or standing);Decreased safety awareness;Pain;Decreased knowledge of precautions;Cardiopulmonary status limiting activity;Decreased knowledge of use of DME or AE;Decreased cognition ?  ?   ?OT Treatment/Interventions: Self-care/ADL training;Therapeutic exercise;Neuromuscular education;Energy conservation;DME and/or AE instruction;Cognitive remediation/compensation;Therapeutic activities;Balance training;Patient/family education  ?  ?OT Goals(Current goals can be found in the care plan  section) Acute Rehab OT Goals ?Patient Stated Goal: none stated ?OT Goal Formulation: Patient unable to participate in  goal setting ?Time For Goal Achievement: 01/19/22 ?Potential to Achieve Goals: Fair  ?OT Frequency: Min 2X/week ?  ? ?Co-evaluation   ?  ?  ?  ?  ? ?  ?AM-PAC OT "6 Clicks" Daily Activity     ?Outcome

## 2022-01-05 NOTE — Progress Notes (Addendum)
?                                  PROGRESS NOTE                                             ?                                                                                                                     ?                                         ? ? Patient Demographics:  ? ? [name], is a 61 y.o. male, DOB - January 17, 1961, IRC:[id number] ? ?Outpatient Primary MD for the patient is [name], MD    LOS - 2  Admit date - 01/03/2022   ? ?Chief Complaint  ?Patient presents with  ? Altered Mental Status  ? Weakness  ?    ? ?Brief Narrative (HPI from H&P)    61 y.o. male with medical history significant for ongoing alcohol abuse, hypertension, COPD, paranoid schizophrenia, depression/anxiety, nonischemic cardiomyopathy, tobacco abuse, CAD, CHF, noncompliance with medications, recent small incidental PE in Nov 2022, who presented to the hospital after neighbors called EMS as they were concerned that patient was getting more confused and was having repeated falls at home. ? ? Subjective:  ? ?Patient in bed, appears comfortable, denies any headache, no fever, no chest pain or pressure, no shortness of breath , no abdominal pain. No focal weakness. ? ? Assessment  & Plan :  ? ?History of alcohol abuse, currently in DTs with multiple falls at home.  Head CT unremarkable, no focal deficits, placed on Librium along with CIWA protocol, high-dose IV thiamine for 5 days.  Counseled to quit alcohol.  Monitor closely, with supportive care DTs seems to be stable.  Will advance activity, PT OT today. ?  ?2.  Unsafe living conditions.  Social work input ?  ?3.  History of paranoid schizophrenia, depression - currently no suicidal ideation.  Continue Zoloft , Buspar + add Seroquel. ?  ?4.  History of nonischemic cardiomyopathy chronic systolic heart failure EF around 40%.  Currently appears compensated.  Continue Coreg and ARB while here. ?  ?5.  Dyslipidemia.  Holding statin due to #13  below. ?  ?6. COPD.  Stable no acute issues.  Supportive care. ?  ?7.  Stray of COVID-19 infection.  Supportive care. ?  ?8. Stage IA (T1a, N0, M0) non-small cell right upper lobe lung cancer, adenocarcinoma diagnosed in November 2016 ?S/P wedge resection right upper lobe nodule, and thoracoscopic right upper lobectomy under the care of Dr. Roxan Hockey on 09/04/2015.  PCP  to monitor. ?  ?9.  HIV.  Continue home medications, stable CD4 count, Viral load pending. ?  ?10.  HTN.  On beta-blocker and placed on ARB. ?  ?11.  PAD.  Continue beta-blocker, statin and start aspirin for secondary prevention. ?  ?12.  Incidental small PE in November 2022.  No further anticoagulation due to ongoing alcohol abuse and multiple falls.  Risks outweigh benefit. ? ?13.  Mild asymptomatic transaminitis.  Hold statin, trend is improving and nonacute right mid quadrant ultrasound. -ve Ac.Hep.panel. ? ?14.  2 hepatic cyst noted on right upper quadrant ultrasound.  Outpatient liver MRI and GI follow-up. ? ?15. Ur. Retention - Foley + Flomax 01/05/22, outpt Urology follow up. ? ?   ? ?Condition - Fair ? ?Family Communication  :  None present ? ?Code Status :  Full ? ?Consults  :  None ? ?PUD Prophylaxis : PPI ? ? Procedures  :    ? ?CT Head - Non acute ? ?RUQ [place] - Sludge noted within gallbladder lumen. Increased echogenicity of hepatic parenchyma is noted suggesting hepatic steatosis. At least 2 hepatic cysts are noted. 1.9 x 1.1 cm hypoechoic area is noted along right hepatic periphery which may represent fatty sparing, but other pathology cannot be excluded. When the patient is clinically stable and able to follow directions and hold their breath (preferably as an outpatient) further evaluation with dedicated abdominal MRI should be considered. ? ?   ? ?Disposition Plan  :   ? ?Status is: Inpatient ? ?DVT Prophylaxis  :   ? ?heparin injection 5,000 Units Start: 01/03/22 1730 ?  ? ?Lab Results  ?Component Value Date  ? PLT 109 (L)  01/05/2022  ? ? ?Diet :  ?Diet Order   ? ?       ?  DIET SOFT Room service appropriate? Yes; Fluid consistency: Nectar Thick  Diet effective now       ?  ? ?  ?  ? ?  ?  ? ?Inpatient Medications ? ?Scheduled Meds: ? albuterol  2.5 mg Nebulization BID  ? aspirin  81 mg Oral Daily  ? busPIRone  30 mg Oral BID  ? [START ON 01/06/2022] carvedilol  3.125 mg Oral BID WC  ? chlordiazePOXIDE  10 mg Oral TID  ? emtricitabine-tenofovir AF  1 tablet Oral Daily  ? heparin  5,000 Units Subcutaneous Q8H  ? LORazepam  0-4 mg Intravenous Q12H  ? Or  ? LORazepam  0-4 mg Oral Q12H  ? LORazepam  4 mg Intravenous Once  ? pantoprazole  40 mg Oral Daily  ? polyethylene glycol  17 g Oral Daily  ? QUEtiapine  50 mg Oral BID  ? sertraline  200 mg Oral Daily  ? [START ON 01/09/2022] thiamine  100 mg Oral Daily  ? umeclidinium-vilanterol  1 puff Inhalation QAC breakfast  ? ?Continuous Infusions: ? lactated ringers 75 mL/hr at 01/05/22 0960  ? thiamine injection 500 mg (01/05/22 1115)  ? ?PRN Meds:.albuterol, cloNIDine, hydrOXYzine, ondansetron **OR** ondansetron (ZOFRAN) IV, sodium phosphate, traMADol ? ?Antibiotics  :   ? ?Anti-infectives (From admission, onward)  ? ? Start     Dose/Rate Route Frequency Ordered Stop  ? 01/03/22 1745  emtricitabine-tenofovir AF (DESCOVY) 200-25 MG per tablet 1 tablet       ? 1 tablet Oral Daily 01/03/22 1738    ? ?  ? ? ? Time Spent in minutes  30 ? ? ?Lala Lund M.D on 01/05/2022 at 12:29 PM ? ?  To page go to [email or web address]  ? ?[facility] -  Office  [phone] ? ?See all Orders from today for further details ? ? ? Objective:  ? ?Vitals:  ? 01/05/22 0001 01/05/22 0415 01/05/22 2330 01/05/22 0852  ?BP: (!) 75/63 (!) 85/61 (!) 92/57   ?Pulse: 84 80 81   ?Resp: 18 18 19    ?Temp: 97.7 ?F (36.5 ?C) 98 ?F (36.7 ?C) 98.4 ?F (36.9 ?C)   ?TempSrc: Axillary Oral Oral   ?SpO2: 96% 97% 95% 96%  ?Weight:      ?Height:      ? ? ?Wt Readings from Last 3 Encounters:  ?01/04/22 81.6 kg  ?08/04/21 86.9 kg  ?07/07/21  86.2 kg  ? ? ? ?Intake/Output Summary (Last 24 hours) at 01/05/2022 1229 ?Last data filed at 01/05/2022 0854 ?Gross per 24 hour  ?Intake 530 ml  ?Output --  ?Net 530 ml  ? ? ? ?Physical Exam ? ?Awake Alert x1, No new F.N deficits,   ?Octa.AT,PERRAL ?Supple Neck, No JVD,   ?Symmetrical Chest wall movement, Good air movement bilaterally, CTAB ?RRR,No Gallops, Rubs or new Murmurs,  ?+ve B.Sounds, Abd Soft, No tenderness,   ?No Cyanosis, Clubbing or edema  ?  ?  ? ? Data Review:  ? ? ?CBC ?Recent Labs  ?Lab 01/03/22 ?1415 01/04/22 ?0448 01/05/22 ?0403  ?WBC 10.3 8.0 7.7  ?HGB 18.0* 17.9* 17.0  ?HCT 52.2* 50.8 47.0  ?PLT 119* 107* 109*  ?MCV 91.4 91.7 89.7  ?MCH 31.5 32.3 32.4  ?MCHC 34.5 35.2 36.2*  ?RDW 14.5 14.6 14.1  ?LYMPHSABS  --  2.5 2.6  ?MONOABS  --  1.0 1.0  ?EOSABS  --  0.2 0.2  ?BASOSABS  --  0.1 0.1  ? ? ?Electrolytes ?Recent Labs  ?Lab 01/03/22 ?1415 01/03/22 ?1758 01/04/22 ?0448 01/05/22 ?0403  ?NA 139  --  141 137  ?K 3.7  --  3.6 3.7  ?CL 102  --  104 105  ?CO2 22  --  27 22  ?GLUCOSE 143*  --  84 115*  ?BUN 29*  --  29* 31*  ?CREATININE 1.06 1.11 1.02 1.27*  ?CALCIUM 9.9  --  9.7 9.5  ?AST 115*  --  128* 94*  ?ALT 207*  --  213* 192*  ?ALKPHOS 55  --  54 52  ?BILITOT 2.3*  --  2.6* 1.8*  ?ALBUMIN 4.3  --  4.0 3.8  ?MG  --   --  2.2 2.1  ?TSH  --  1.738  --   --   ?AMMONIA 23  --   --   --   ?BNP  --   --  37.6 46.0  ? ? ?------------------------------------------------------------------------------------------------------------------ ?No results for input(s): CHOL, HDL, LDLCALC, TRIG, CHOLHDL, LDLDIRECT in the last 72 hours. ? ?Lab Results  ?Component Value Date  ? HGBA1C 6.1 (H) 07/31/2021  ? ? ?Recent Labs  ?  01/03/22 ?0762  ?TSH 1.738  ? ?  ? ?Radiology Reports ?CT Head Wo Contrast ? ?Result Date: 01/03/2022 ?CLINICAL DATA:  Neurological deficit EXAM: CT HEAD WITHOUT CONTRAST TECHNIQUE: Contiguous axial images were obtained from the base of the skull through the vertex without intravenous contrast.  RADIATION DOSE REDUCTION: This exam was performed according to the departmental dose-optimization program which includes automated exposure control, adjustment of the mA and/or kV according to patient size and/or use of

## 2022-01-06 DIAGNOSIS — F10931 Alcohol use, unspecified with withdrawal delirium: Secondary | ICD-10-CM | POA: Diagnosis not present

## 2022-01-06 LAB — MAGNESIUM: Magnesium: 2 mg/dL (ref 1.7–2.4)

## 2022-01-06 LAB — CBC WITH DIFFERENTIAL/PLATELET
Abs Immature Granulocytes: 0.02 10*3/uL (ref 0.00–0.07)
Basophils Absolute: 0 10*3/uL (ref 0.0–0.1)
Basophils Relative: 1 %
Eosinophils Absolute: 0.1 10*3/uL (ref 0.0–0.5)
Eosinophils Relative: 3 %
HCT: 46.3 % (ref 39.0–52.0)
Hemoglobin: 16.7 g/dL (ref 13.0–17.0)
Immature Granulocytes: 0 %
Lymphocytes Relative: 35 %
Lymphs Abs: 2 10*3/uL (ref 0.7–4.0)
MCH: 32.5 pg (ref 26.0–34.0)
MCHC: 36.1 g/dL — ABNORMAL HIGH (ref 30.0–36.0)
MCV: 90.1 fL (ref 80.0–100.0)
Monocytes Absolute: 0.7 10*3/uL (ref 0.1–1.0)
Monocytes Relative: 13 %
Neutro Abs: 2.8 10*3/uL (ref 1.7–7.7)
Neutrophils Relative %: 48 %
Platelets: 96 10*3/uL — ABNORMAL LOW (ref 150–400)
RBC: 5.14 MIL/uL (ref 4.22–5.81)
RDW: 14 % (ref 11.5–15.5)
WBC: 5.7 10*3/uL (ref 4.0–10.5)
nRBC: 0 % (ref 0.0–0.2)

## 2022-01-06 LAB — BRAIN NATRIURETIC PEPTIDE: B Natriuretic Peptide: 21.2 pg/mL (ref 0.0–100.0)

## 2022-01-06 LAB — COMPREHENSIVE METABOLIC PANEL
ALT: 160 U/L — ABNORMAL HIGH (ref 0–44)
AST: 64 U/L — ABNORMAL HIGH (ref 15–41)
Albumin: 3.5 g/dL (ref 3.5–5.0)
Alkaline Phosphatase: 50 U/L (ref 38–126)
Anion gap: 8 (ref 5–15)
BUN: 19 mg/dL (ref 8–23)
CO2: 24 mmol/L (ref 22–32)
Calcium: 9.2 mg/dL (ref 8.9–10.3)
Chloride: 107 mmol/L (ref 98–111)
Creatinine, Ser: 0.87 mg/dL (ref 0.61–1.24)
GFR, Estimated: >60 mL/min (ref >60)
Glucose, Bld: 104 mg/dL — ABNORMAL HIGH (ref 70–99)
Potassium: 3.5 mmol/L (ref 3.5–5.1)
Sodium: 139 mmol/L (ref 135–145)
Total Bilirubin: 1.6 mg/dL — ABNORMAL HIGH (ref 0.3–1.2)
Total Protein: 6.5 g/dL (ref 6.5–8.1)

## 2022-01-06 LAB — VITAMIN B1: Vitamin B1 (Thiamine): 486.6 nmol/L — ABNORMAL HIGH (ref 66.5–200.0)

## 2022-01-06 LAB — HIV-1 RNA QUANT-NO REFLEX-BLD
HIV 1 RNA Quant: <20 copies/mL
LOG10 HIV-1 RNA: UNDETERMINED log10copy/mL

## 2022-01-06 MED ORDER — CHLORDIAZEPOXIDE HCL 5 MG PO CAPS
5.0000 mg | ORAL_CAPSULE | Freq: Three times a day (TID) | ORAL | Status: DC
  Administered 2022-01-06 (×2): 5 mg via ORAL
  Filled 2022-01-06 (×3): qty 1

## 2022-01-06 MED ORDER — HALOPERIDOL LACTATE 5 MG/ML IJ SOLN
2.0000 mg | Freq: Four times a day (QID) | INTRAMUSCULAR | Status: DC | PRN
  Administered 2022-01-07 – 2022-01-08 (×3): 2 mg via INTRAVENOUS
  Filled 2022-01-06 (×4): qty 1

## 2022-01-06 MED ORDER — POTASSIUM CHLORIDE CRYS ER 20 MEQ PO TBCR
40.0000 meq | EXTENDED_RELEASE_TABLET | Freq: Once | ORAL | Status: AC
Start: 2022-01-06 — End: 2022-01-06
  Administered 2022-01-06: 40 meq via ORAL
  Filled 2022-01-06: qty 2

## 2022-01-06 NOTE — Progress Notes (Signed)
Physical Therapy Treatment ?Patient Details ?Name: [name] ?MRN: [id number] ?DOB: 10-10-1960 ?Today's Date: 01/06/2022 ? ? ?History of Present Illness 61 y.o. male admitted 4/10 who presented to the hospital after neighbors called EMS as they were concerned that patient was getting more confused and was having repeated falls at home.  PMH: ongoing  alcohol abuse, hypertension, COPD, paranoid schizophrenia, depression/anxiety, nonischemic cardiomyopathy, tobacco abuse, CAD, CHF, noncompliance with medications, recent small incidental PE in Nov 2022 ? ?  ?PT Comments  ? ? Patient continues to be limited confusion and weakness. Patient required modA for sit to stand and side steps towards HOB. Patient denying lightheadedness and dizziness but by observation seemed to be dizzy with staggering towards R. Difficult to obtain BP due to patient utlizing UE despite repeated cues to keep arm still. Updated recommendation to SNF at discharge to maximize functional mobility and safety.  ?   ?Recommendations for follow up therapy are one component of a multi-disciplinary discharge planning process, led by the attending physician.  Recommendations may be updated based on patient status, additional functional criteria and insurance authorization. ? ?Follow Up Recommendations ? Skilled nursing-short term rehab (<3 hours/day) ?  ?  ?Assistance Recommended at Discharge Frequent or constant Supervision/Assistance  ?Patient can return home with the following A lot of help with walking and/or transfers;A lot of help with bathing/dressing/bathroom;Assistance with cooking/housework;Direct supervision/assist for medications management;Direct supervision/assist for financial management;Assist for transportation;Help with stairs or ramp for entrance ?  ?Equipment Recommendations ? Rolling Muntaha Vermette (2 wheels)  ?  ?Recommendations for Other Services   ? ? ?  ?Precautions / Restrictions Precautions ?Precautions: Fall ?Precaution Comments:  orthostatic ?Restrictions ?Weight Bearing Restrictions: No  ?  ? ?Mobility ? Bed Mobility ?Overal bed mobility: Needs Assistance ?Bed Mobility: Supine to Sit, Sit to Supine ?  ?  ?Supine to sit: Min assist ?Sit to supine: Min assist ?  ?General bed mobility comments: assist for LE management ?  ? ?Transfers ?Overall transfer level: Needs assistance ?Equipment used: Rolling Naiya Corral (2 wheels) ?Transfers: Sit to/from Stand, Bed to chair/wheelchair/BSC ?Sit to Stand: Mod assist ?  ?Step pivot transfers: Mod assist ?  ?  ?  ?General transfer comment: modA to rise and steady from EOB. Able to take steps towards [facility] but modA for balance. Patient staggering towards R side and looking in a daze at EOB but denying dizziness or lightheadedness. Difficult to obtain accurate BP readings due to patient using arm despite multiple cues to keep arm still ?  ? ?Ambulation/Gait ?  ?  ?  ?  ?  ?  ?  ?General Gait Details: deferred due to assist requirement for short steps and lack of +2 assist ? ? ?Stairs ?  ?  ?  ?  ?  ? ? ?Wheelchair Mobility ?  ? ?Modified Rankin (Stroke Patients Only) ?  ? ? ?  ?Balance Overall balance assessment: Needs assistance ?Sitting-balance support: Feet supported, Bilateral upper extremity supported ?Sitting balance-Leahy Scale: Poor ?Sitting balance - Comments: R and posterior lean while sitting EOB requiring cues and assist to maintain midline ?Postural control: Right lateral lean, Posterior lean ?Standing balance support: Bilateral upper extremity supported, During functional activity ?Standing balance-Leahy Scale: Poor ?Standing balance comment: relies on UE support and external supoprt ?  ?  ?  ?  ?  ?  ?  ?  ?  ?  ?  ?  ? ?  ?Cognition Arousal/Alertness: Awake/alert ?Behavior During Therapy: Impulsive, Restless ?Overall Cognitive Status: Impaired/Different  from baseline ?Area of Impairment: Orientation, Attention, Memory, Following commands, Safety/judgement, Awareness, Problem solving ?  ?  ?  ?  ?   ?  ?  ?  ?Orientation Level: Disoriented to, Place, Time, Situation ?Current Attention Level: Focused ?Memory: Decreased recall of precautions, Decreased short-term memory ?Following Commands: Follows one step commands consistently ?Safety/Judgement: Decreased awareness of safety, Decreased awareness of deficits ?Awareness: Intellectual ?Problem Solving: Difficulty sequencing, Requires verbal cues ?General Comments: reporting August and 1997. Able to follow commands consistently but poor awareness ?  ?  ? ?  ?Exercises   ? ?  ?General Comments   ?  ?  ? ?Pertinent Vitals/Pain Pain Assessment ?Pain Assessment: No/denies pain  ? ? ?Home Living   ?  ?  ?  ?  ?  ?  ?  ?  ?  ?   ?  ?Prior Function    ?  ?  ?   ? ?PT Goals (current goals can now be found in the care plan section) Acute Rehab PT Goals ?Patient Stated Goal: to go home ?PT Goal Formulation: With patient ?Time For Goal Achievement: 01/18/22 ?Potential to Achieve Goals: Good ?Progress towards PT goals: Progressing toward goals ? ?  ?Frequency ? ? ? Min 3X/week ? ? ? ?  ?PT Plan Current plan remains appropriate  ? ? ?Co-evaluation   ?  ?  ?  ?  ? ?  ?AM-PAC PT "6 Clicks" Mobility   ?Outcome Measure ? Help needed turning from your back to your side while in a flat bed without using bedrails?: None ?Help needed moving from lying on your back to sitting on the side of a flat bed without using bedrails?: A Little ?Help needed moving to and from a bed to a chair (including a wheelchair)?: A Lot ?Help needed standing up from a chair using your arms (e.g., wheelchair or bedside chair)?: A Lot ?Help needed to walk in hospital room?: Total ?Help needed climbing 3-5 steps with a railing? : Total ?6 Click Score: 13 ? ?  ?End of Session Equipment Utilized During Treatment: Gait belt ?Activity Tolerance: Patient tolerated treatment well ?Patient left: in bed;with call bell/phone within reach;with bed alarm set;with restraints reapplied ?Nurse Communication: Mobility  status ?PT Visit Diagnosis: Unsteadiness on feet (R26.81) ?  ? ? ?Time: 8325-4982 ?PT Time Calculation (min) (ACUTE ONLY): 24 min ? ?Charges:  $Therapeutic Activity: 23-37 mins          ?          ? ?Shanette Tamargo A. Gilford Rile, PT, DPT ?Acute Rehabilitation Services ?Pager [phone] ?Office [phone] ? ? ? ?Jorgen Wolfinger A Renai Lopata ?01/06/2022, 3:18 PM ? ?

## 2022-01-06 NOTE — Consult Note (Signed)
? ?  [facility] CM Inpatient Consult ? ? ?01/06/2022 ? ?[name] ?02-Nov-1960 ?[id number] ? ?[name] [ACO] Patient: [name] ? ?Primary Care Provider:  [name], MD, [facility],  is an embedded provider with a Chronic Care Management team and program, and is listed for the transition of care follow up and appointments. ? ?Patient was screened for Embedded practice service needs for chronic care management and found to be active with the CCM RNCM and  Embedded pharmacist ? ?Plan: Continue to follow for progress and any additional needs for Embedded Chronic Care Management for post hospital needs. ? ?Please contact for further questions, ? ?Natividad Brood, RN BSN CCM ?[facility] Liaison ? [phone] business mobile phone ?Toll free office [phone]  ?Fax number: [phone] ?[email or web address] ? ? ? ?

## 2022-01-06 NOTE — Care Management Important Message (Signed)
Important Message ? ?Patient Details  ?Name: [name] ?MRN: [id number] ?Date of Birth: 1960-10-24 ? ? ?Medicare Important Message Given:  Yes ? ? ? ? ?[name] ?01/06/2022, 3:09 PM ?

## 2022-01-06 NOTE — Progress Notes (Signed)
?                                  PROGRESS NOTE                                             ?                                                                                                                     ?                                         ? ? Patient Demographics:  ? ? [name], is a 61 y.o. male, DOB - [birth year], [id number]:[id number] ? ?Outpatient Primary MD for the patient is [name], MD    LOS - 3  Admit date - 01/03/2022   ? ?Chief Complaint  ?Patient presents with  ? Altered Mental Status  ? Weakness  ?    ? ?Brief Narrative (HPI from H&P)    61 y.o. male with medical history significant for ongoing alcohol abuse, hypertension, COPD, paranoid schizophrenia, depression/anxiety, nonischemic cardiomyopathy, tobacco abuse, CAD, CHF, noncompliance with medications, recent small incidental PE in Nov 2022, who presented to the hospital after neighbors called EMS as they were concerned that patient was getting more confused and was having repeated falls at home. ? ? Subjective:  ? ?Patient in bed appears to be in no distress but confused denies any headache chest or abdominal pain. ? ? Assessment  & Plan :  ? ?History of alcohol abuse, currently in DTs with multiple falls at home.  Head CT unremarkable, no focal deficits, placed on Librium along with CIWA protocol, high-dose IV thiamine for 5 days.  Counseled to quit alcohol.  Clean mild to moderate DTs, continue supportive care low-dose Seroquel and Haldol added as well as he also has underlying paranoid schizophrenia with frequent hallucinations.  PT-OT May require placement. ?  ?2.  Unsafe living conditions.  Social work input ?  ?3.  History of paranoid schizophrenia, depression - currently no suicidal ideation.  Continue Zoloft , Buspar + added Seroquel for hallucinations along with as needed Haldol. ?  ?4.  History of nonischemic cardiomyopathy chronic systolic heart failure EF around 40%.  Currently appears  compensated.  Continue Coreg and ARB while here. ?  ?5.  Dyslipidemia.  Holding statin due to #13 below. ?  ?6. COPD.  Stable no acute issues.  Supportive care. ?  ?7.  Past history of COVID-19 infection.  Supportive care. ?  ?8. Stage IA (T1a, N0, M0) non-small cell right upper lobe lung cancer, adenocarcinoma diagnosed in November 2016 ?S/P wedge resection right upper lobe nodule, and thoracoscopic  right upper lobectomy under the care of Dr. Roxan Hockey on 09/04/2015.  PCP to monitor. ?  ?9.  HIV.  Continue home medications, stable CD4 count, Viral load pending. ?  ?10.  HTN.  On beta-blocker and placed on ARB. ?  ?11.  PAD.  Continue beta-blocker, statin and start aspirin for secondary prevention. ?  ?12.  Incidental small PE in November 2022.  No further anticoagulation due to ongoing alcohol abuse and multiple falls.  Risks outweigh benefit. ? ?13.  Mild asymptomatic transaminitis.  Hold statin, trend is improving and nonacute right mid quadrant ultrasound. -ve Ac.Hep.panel. ? ?14.  2 hepatic cyst noted on right upper quadrant ultrasound.  Outpatient liver MRI and GI follow-up. ? ?15. Ur. Retention - Foley + Flomax 01/05/22, outpt Urology follow up. ? ?   ? ?Condition - Fair ? ?Family Communication  :  None present ? ?Code Status :  Full ? ?Consults  :  None ? ?PUD Prophylaxis : PPI ? ? Procedures  :    ? ?CT Head - Non acute ? ?RUQ [place] - Sludge noted within gallbladder lumen. Increased echogenicity of hepatic parenchyma is noted suggesting hepatic steatosis. At least 2 hepatic cysts are noted. 1.9 x 1.1 cm hypoechoic area is noted along right hepatic periphery which may represent fatty sparing, but other pathology cannot be excluded. When the patient is clinically stable and able to follow directions and hold their breath (preferably as an outpatient) further evaluation with dedicated abdominal MRI should be considered. ? ?   ? ?Disposition Plan  :   ? ?Status is: Inpatient ? ?DVT Prophylaxis  :   ? ?heparin  injection 5,000 Units Start: 01/03/22 1730 ?  ? ?Lab Results  ?Component Value Date  ? PLT 96 (L) 01/06/2022  ? ? ?Diet :  ?Diet Order   ? ?       ?  DIET SOFT Room service appropriate? Yes; Fluid consistency: Nectar Thick  Diet effective now       ?  ? ?  ?  ? ?  ?  ? ?Inpatient Medications ? ?Scheduled Meds: ? albuterol  2.5 mg Nebulization BID  ? aspirin  81 mg Oral Daily  ? busPIRone  30 mg Oral BID  ? carvedilol  3.125 mg Oral BID WC  ? chlordiazePOXIDE  5 mg Oral TID  ? Chlorhexidine Gluconate Cloth  6 each Topical Daily  ? emtricitabine-tenofovir AF  1 tablet Oral Daily  ? heparin  5,000 Units Subcutaneous Q8H  ? LORazepam  0-4 mg Intravenous Q12H  ? Or  ? LORazepam  0-4 mg Oral Q12H  ? pantoprazole  40 mg Oral Daily  ? polyethylene glycol  17 g Oral Daily  ? potassium chloride  40 mEq Oral Once  ? QUEtiapine  50 mg Oral BID  ? sertraline  200 mg Oral Daily  ? tamsulosin  0.4 mg Oral Daily  ? [START ON 01/09/2022] thiamine  100 mg Oral Daily  ? umeclidinium-vilanterol  1 puff Inhalation QAC breakfast  ? ?Continuous Infusions: ? thiamine injection 500 mg (01/06/22 0951)  ? ?PRN Meds:.albuterol, cloNIDine, haloperidol lactate, ondansetron **OR** ondansetron (ZOFRAN) IV, sodium phosphate, traMADol ? ?Antibiotics  :   ? ?Anti-infectives (From admission, onward)  ? ? Start     Dose/Rate Route Frequency Ordered Stop  ? 01/03/22 1745  emtricitabine-tenofovir AF (DESCOVY) 200-25 MG per tablet 1 tablet       ? 1 tablet Oral Daily 01/03/22 1738    ? ?  ? ? ?  Time Spent in minutes  30 ? ? ?Lala Lund M.D on 01/06/2022 at 11:29 AM ? ?To page go to [email or web address]  ? ?[facility] -  Office  [phone] ? ?See all Orders from today for further details ? ? ? Objective:  ? ?Vitals:  ? 01/05/22 2136 01/06/22 0419 01/06/22 0827 01/06/22 1006  ?BP:  116/67 106/74 120/89  ?Pulse:  83 97 98  ?Resp:  16 16 20   ?Temp:  98 ?F (36.7 ?C) 98.3 ?F (36.8 ?C) 98.2 ?F (36.8 ?C)  ?TempSrc:  Oral Oral Oral  ?SpO2: 98% 100%  100%   ?Weight:      ?Height:      ? ? ?Wt Readings from Last 3 Encounters:  ?01/04/22 81.6 kg  ?08/04/21 86.9 kg  ?07/07/21 86.2 kg  ? ? ? ?Intake/Output Summary (Last 24 hours) at 01/06/2022 1129 ?Last data filed at 01/06/2022 0750 ?Gross per 24 hour  ?Intake 1578.75 ml  ?Output 825 ml  ?Net 753.75 ml  ? ? ? ?Physical Exam ? ?Awake but pleasantly confused, minimal DTs, no focal neurological deficits moving all 4 extremities by himself, Foley in place ?[place].AT,PERRAL ?Supple Neck, No JVD,   ?Symmetrical Chest wall movement, Good air movement bilaterally, CTAB ?RRR,No Gallops, Rubs or new Murmurs,  ?+ve B.Sounds, Abd Soft, No tenderness,   ?No Cyanosis,   ? ? ? Data Review:  ? ? ?CBC ?Recent Labs  ?Lab 01/03/22 ?1415 01/04/22 ?0448 01/05/22 ?0403 01/06/22 ?7902  ?WBC 10.3 8.0 7.7 5.7  ?HGB 18.0* 17.9* 17.0 16.7  ?HCT 52.2* 50.8 47.0 46.3  ?PLT 119* 107* 109* 96*  ?MCV 91.4 91.7 89.7 90.1  ?MCH 31.5 32.3 32.4 32.5  ?MCHC 34.5 35.2 36.2* 36.1*  ?RDW 14.5 14.6 14.1 14.0  ?LYMPHSABS  --  2.5 2.6 2.0  ?MONOABS  --  1.0 1.0 0.7  ?EOSABS  --  0.2 0.2 0.1  ?BASOSABS  --  0.1 0.1 0.0  ? ? ?Electrolytes ?Recent Labs  ?Lab 01/03/22 ?1415 01/03/22 ?1758 01/04/22 ?0448 01/05/22 ?0403 01/06/22 ?4097  ?NA 139  --  141 137 139  ?K 3.7  --  3.6 3.7 3.5  ?CL 102  --  104 105 107  ?CO2 22  --  27 22 24   ?GLUCOSE 143*  --  84 115* 104*  ?BUN 29*  --  29* 31* 19  ?CREATININE 1.06 1.11 1.02 1.27* 0.87  ?CALCIUM 9.9  --  9.7 9.5 9.2  ?AST 115*  --  128* 94* 64*  ?ALT 207*  --  213* 192* 160*  ?ALKPHOS 55  --  54 52 50  ?BILITOT 2.3*  --  2.6* 1.8* 1.6*  ?ALBUMIN 4.3  --  4.0 3.8 3.5  ?MG  --   --  2.2 2.1 2.0  ?TSH  --  1.738  --   --   --   ?AMMONIA 23  --   --   --   --   ?BNP  --   --  37.6 46.0 21.2  ? ? ?------------------------------------------------------------------------------------------------------------------ ?No results for input(s): CHOL, HDL, LDLCALC, TRIG, CHOLHDL, LDLDIRECT in the last 72 hours. ? ?Lab Results  ?Component Value  Date  ? HGBA1C 6.1 (H) 07/31/2021  ? ? ?Recent Labs  ?  01/03/22 ?3532  ?TSH 1.738  ? ?  ? ?Radiology Reports ?CT Head Wo Contrast ? ?Result Date: 01/03/2022 ?CLINICAL DATA:  Neurological deficit EXAM: CT HEAD WITH

## 2022-01-07 DIAGNOSIS — F10931 Alcohol use, unspecified with withdrawal delirium: Secondary | ICD-10-CM | POA: Diagnosis not present

## 2022-01-07 LAB — COMPREHENSIVE METABOLIC PANEL
ALT: 147 U/L — ABNORMAL HIGH (ref 0–44)
AST: 56 U/L — ABNORMAL HIGH (ref 15–41)
Albumin: 3.7 g/dL (ref 3.5–5.0)
Alkaline Phosphatase: 62 U/L (ref 38–126)
Anion gap: 6 (ref 5–15)
BUN: 16 mg/dL (ref 8–23)
CO2: 26 mmol/L (ref 22–32)
Calcium: 9.6 mg/dL (ref 8.9–10.3)
Chloride: 107 mmol/L (ref 98–111)
Creatinine, Ser: 1.11 mg/dL (ref 0.61–1.24)
GFR, Estimated: >60 mL/min (ref >60)
Glucose, Bld: 107 mg/dL — ABNORMAL HIGH (ref 70–99)
Potassium: 4.7 mmol/L (ref 3.5–5.1)
Sodium: 139 mmol/L (ref 135–145)
Total Bilirubin: 1.3 mg/dL — ABNORMAL HIGH (ref 0.3–1.2)
Total Protein: 6.9 g/dL (ref 6.5–8.1)

## 2022-01-07 LAB — CBC WITH DIFFERENTIAL/PLATELET
Abs Immature Granulocytes: 0.05 10*3/uL (ref 0.00–0.07)
Basophils Absolute: 0.1 10*3/uL (ref 0.0–0.1)
Basophils Relative: 1 %
Eosinophils Absolute: 0.2 10*3/uL (ref 0.0–0.5)
Eosinophils Relative: 2 %
HCT: 47 % (ref 39.0–52.0)
Hemoglobin: 16.4 g/dL (ref 13.0–17.0)
Immature Granulocytes: 1 %
Lymphocytes Relative: 30 %
Lymphs Abs: 2.2 10*3/uL (ref 0.7–4.0)
MCH: 31.9 pg (ref 26.0–34.0)
MCHC: 34.9 g/dL (ref 30.0–36.0)
MCV: 91.4 fL (ref 80.0–100.0)
Monocytes Absolute: 1 10*3/uL (ref 0.1–1.0)
Monocytes Relative: 13 %
Neutro Abs: 3.9 10*3/uL (ref 1.7–7.7)
Neutrophils Relative %: 53 %
Platelets: 125 10*3/uL — ABNORMAL LOW (ref 150–400)
RBC: 5.14 MIL/uL (ref 4.22–5.81)
RDW: 14.2 % (ref 11.5–15.5)
WBC: 7.3 10*3/uL (ref 4.0–10.5)
nRBC: 0 % (ref 0.0–0.2)

## 2022-01-07 LAB — BRAIN NATRIURETIC PEPTIDE: B Natriuretic Peptide: 36.2 pg/mL (ref 0.0–100.0)

## 2022-01-07 LAB — MAGNESIUM: Magnesium: 2.3 mg/dL (ref 1.7–2.4)

## 2022-01-07 MED ORDER — LACTATED RINGERS IV SOLN: INTRAVENOUS | Status: AC

## 2022-01-07 NOTE — Progress Notes (Signed)
MD order to flush foley with 113mL sterile water. Foley has seal. MD verbal order to break seal to flush foley. Foley catheter flushed and urine returned. ?

## 2022-01-07 NOTE — NC FL2 (Signed)
?[place] MEDICAID FL2 LEVEL OF CARE SCREENING TOOL  ?  ? ?IDENTIFICATION  ?Patient Name: ?[name] Birthdate: [birth year] Sex: male Admission Date (Current Location): ?01/03/2022  ?[place] and [place] Number: ? [place] ?  Facility and Address:  ?[facility], [place] [address] [place], [place] [address] ?     Provider Number: ?[id number]  ?Attending Physician Name and Address:  ?Thurnell Lose, MD ? Relative Name and Phone Number:  ?[name] (Other)   [phone] ?   ?Current Level of Care: ?Hospital Recommended Level of Care: ?[place] Prior Approval Number: ?  ? ?Date Approved/Denied: ?  PASRR Number: ?  ? ?Discharge Plan: ?SNF ?  ? ?Current Diagnoses: ?Patient Active Problem List  ? Diagnosis Date Noted  ? DTs (delirium tremens) ([place]) 01/05/2022  ? AMS (altered mental status) 01/03/2022  ? Severe episode of recurrent major depressive disorder, without psychotic features ([place]) 07/31/2021  ? Pulmonary embolism ([place]) 07/30/2021  ? Alcohol abuse with intoxication ([place]) 07/30/2021  ? Suicidal ideation 07/30/2021  ? Long toenail 01/11/2021  ? Aortic atherosclerosis ([place])   ? Generalized anxiety disorder 11/18/2020  ? Moderate episode of recurrent major depressive disorder ([place]) 11/18/2020  ? Panic disorder 11/18/2020  ? Schizophrenia ([place]) 05/18/2020  ? Thiamine deficiency 12/10/2019  ? Screening-pulmonary TB 12/04/2019  ? Irritable bowel syndrome with diarrhea 07/01/2019  ? Type II diabetes mellitus with manifestations ([place]) 06/26/2019  ? De Quervain's tenosynovitis, left 04/11/2018  ? 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 Insomnia w/ sleep apnea 11/30/2016  ? Syphili, latent 03/05/2016  ? Glaucoma suspect of both eyes 11/19/2015  ? Non-small cell carcinoma of lung, stage 1 ([place]) 10/12/2015  ? COPD GOLD II if use fev1/VC and still smoking  08/11/2015  ? High risk homosexual behavior 07/09/2014  ? PUD (peptic ulcer disease) 01/09/2013  ? Routine general  medical examination at a health care facility 06/04/2012  ? Paranoid schizophrenia ([place]) 08/01/2011  ? DJD (degenerative joint disease) of knee 03/15/2011  ? Obstructive sleep apnea 11/29/2010  ? 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 Allergic rhinitis 03/17/2010  ? Smoking 12/14/2009  ? HYPERTENSION, BENIGN 10/05/2009  ? Nonischemic cardiomyopathy ([place]) 10/05/2009  ? PAD (peripheral artery disease) ([place]) 09/15/2009  ? Hx of adenomatous colonic polyps 12/03/2008  ? Hyperlipidemia with target LDL less than 130 06/23/2008  ? BPH associated with nocturia 06/23/2008  ? ? ?Orientation RESPIRATION BLADDER Height & Weight   ?  ?Self ? Normal Incontinent Weight: 180 lb (81.6 kg) ?Height:  6\' 2"  (188 cm)  ?BEHAVIORAL SYMPTOMS/MOOD NEUROLOGICAL BOWEL NUTRITION STATUS  ?    Incontinent Diet (see d/c summary)  ?AMBULATORY STATUS COMMUNICATION OF NEEDS Skin   ?Extensive Assist Verbally Normal ?  ?  ?  ?    ?     ?     ? ? ?Personal Care Assistance Level of Assistance  ?Feeding, Dressing, Bathing Bathing Assistance: Maximum assistance ?  ?Dressing Assistance: Maximum assistance ?   ? ?Functional Limitations Info  ?Speech, Sight, Hearing Sight Info: Adequate ?Hearing Info: Adequate ?Speech Info: Adequate  ? ? ?SPECIAL CARE FACTORS FREQUENCY  ?OT (By licensed OT), PT (By licensed PT)   ?  ?PT Frequency: 5x/ week ?OT Frequency: 5x/ week ?  ?  ?  ?   ? ? ?Contractures Contractures Info: Not present  ? ? ?Additional Factors Info  ?Code Status, Allergies Code Status Info: Full ?Allergies Info: Ace Inhibitors   Aspirin   Clopidogrel Bisulfate  Crestor (Rosuvastatin Calcium)   Metformin And Related   Rosuvastatin ?  ?  ?  ?   ? ?Current Medications (01/07/2022):  This is the current hospital active medication list ?Current Facility-Administered Medications  ?Medication Dose Route Frequency Provider Last Rate Last Admin  ? albuterol (PROVENTIL) (2.5 MG/3ML) 0.083% nebulizer solution 3 mL  3 mL Inhalation Q4H PRN Thurnell Lose, MD       ? aspirin chewable tablet 81 mg  81 mg Oral Daily Thurnell Lose, MD   81 mg at 01/07/22 0948  ? busPIRone (BUSPAR) tablet 30 mg  30 mg Oral BID Thurnell Lose, MD   30 mg at 01/07/22 0947  ? carvedilol (COREG) tablet 3.125 mg  3.125 mg Oral BID WC Thurnell Lose, MD   3.125 mg at 01/07/22 0948  ? Chlorhexidine Gluconate Cloth 2 % PADS 6 each  6 each Topical Daily Thurnell Lose, MD   6 each at 01/07/22 [phone]  ? cloNIDine (CATAPRES) tablet 0.1 mg  0.1 mg Oral TID PRN Thurnell Lose, MD      ? emtricitabine-tenofovir AF (DESCOVY) 200-25 MG per tablet 1 tablet  1 tablet Oral Daily Thurnell Lose, MD   1 tablet at 01/07/22 0947  ? haloperidol lactate (HALDOL) injection 2 mg  2 mg Intravenous Q6H PRN Thurnell Lose, MD   2 mg at 01/07/22 0522  ? heparin injection 5,000 Units  5,000 Units Subcutaneous Q8H Thurnell Lose, MD   5,000 Units at 01/07/22 1314  ? lactated ringers infusion   Intravenous Continuous Thurnell Lose, MD 100 mL/hr at 01/07/22 0949 New Bag at 01/07/22 0949  ? ondansetron (ZOFRAN) tablet 4 mg  4 mg Oral Q6H PRN Thurnell Lose, MD      ? Or  ? ondansetron (ZOFRAN) injection 4 mg  4 mg Intravenous Q6H PRN Thurnell Lose, MD      ? pantoprazole (PROTONIX) EC tablet 40 mg  40 mg Oral Daily Thurnell Lose, MD   40 mg at 01/07/22 0948  ? polyethylene glycol (MIRALAX / GLYCOLAX) packet 17 g  17 g Oral Daily Thurnell Lose, MD   17 g at 01/07/22 0948  ? QUEtiapine (SEROQUEL) tablet 50 mg  50 mg Oral BID Thurnell Lose, MD   50 mg at 01/07/22 0948  ? sertraline (ZOLOFT) tablet 200 mg  200 mg Oral Daily Thurnell Lose, MD   200 mg at 01/07/22 0947  ? sodium phosphate (FLEET) 7-19 GM/118ML enema 1 enema  1 enema Rectal Once PRN Thurnell Lose, MD      ? tamsulosin (FLOMAX) capsule 0.4 mg  0.4 mg Oral Daily Thurnell Lose, MD   0.4 mg at 01/07/22 0948  ? [START ON 01/09/2022] thiamine tablet 100 mg  100 mg Oral Daily Thurnell Lose, MD      ? traMADol  Veatrice [place]) tablet 50 mg  50 mg Oral Q12H PRN Thurnell Lose, MD   50 mg at 01/07/22 0522  ? umeclidinium-vilanterol (ANORO ELLIPTA) 62.5-25 MCG/ACT 1 puff  1 puff Inhalation QAC breakfast Thurnell Lose, MD   1 puff at 01/05/22 0851  ? ? ? ?Discharge Medications: ?Please see discharge summary for a list of discharge medications. ? ?Relevant Imaging Results: ? ?Relevant Lab Results: ? ? ?Additional Information ?SSN:  [id number];  [place] COVID-19 Vaccine 06/02/2020 , 01/01/2020 , 12/05/2019 ? ?Paulene Floor Ohm Dentler, LCSWA ? ? ? ? ?

## 2022-01-07 NOTE — Progress Notes (Signed)
?                                  PROGRESS NOTE                                             ?                                                                                                                     ?                                         ? ? Patient Demographics:  ? ? [name], is a 61 y.o. male, DOB - [birth year], IZT:[id number] ? ?Outpatient Primary MD for the patient is [name], MD    LOS - 4  Admit date - 01/03/2022   ? ?Chief Complaint  ?Patient presents with  ? Altered Mental Status  ? Weakness  ?    ? ?Brief Narrative (HPI from H&P)    61 y.o. male with medical history significant for ongoing alcohol abuse, hypertension, COPD, paranoid schizophrenia, depression/anxiety, nonischemic cardiomyopathy, tobacco abuse, CAD, CHF, noncompliance with medications, recent small incidental PE in Nov 2022, who presented to the hospital after neighbors called EMS as they were concerned that patient was getting more confused and was having repeated falls at home. ? ? Subjective:  ? ?Patient in bed, appears comfortable but confused, denies any headache, no fever, no chest pain or pressure, no shortness of breath , no abdominal pain. No new focal weakness. ? ? Assessment  & Plan :  ? ?History of alcohol abuse, currently in DTs with multiple falls at home.  Head CT unremarkable, no focal deficits, placed on Librium along with CIWA protocol, high-dose IV thiamine for 5 days.  Counseled to quit alcohol.  Clean mild to moderate DTs, continue supportive care low-dose Seroquel and Haldol added as well as he also has underlying paranoid schizophrenia with frequent hallucinations.  DTs seem to be improving we will discontinue Librium, continue PT-OT >> SNF. ?  ?2.  Unsafe living conditions.  Social work input ?  ?3.  History of paranoid schizophrenia, depression - currently no suicidal ideation.  Continue Zoloft , Buspar + added Seroquel for hallucinations along with as needed  Haldol. ?  ?4.  History of nonischemic cardiomyopathy chronic systolic heart failure EF around 40%.  Currently appears compensated.  Continue Coreg and ARB while here. ?  ?5.  Dyslipidemia.  Holding statin due to #13 below. ?  ?6. COPD.  Stable no acute issues.  Supportive care. ?  ?7.  Past history of COVID-19 infection.  Supportive care. ?  ?8. Stage IA (T1a, N0, M0) non-small  cell right upper lobe lung cancer, adenocarcinoma diagnosed in November 2016 ?S/P wedge resection right upper lobe nodule, and thoracoscopic right upper lobectomy under the care of Dr. Roxan Hockey on 09/04/2015.  PCP to monitor. ?  ?9.  HIV.  Continue home medications, stable CD4 count, undetectable HIV viral load. ?  ?10.  HTN.  On beta-blocker and placed on ARB. ?  ?11.  PAD.  Continue beta-blocker, statin and start aspirin for secondary prevention. ?  ?12.  Incidental small PE in November 2022.  No further anticoagulation due to ongoing alcohol abuse and multiple falls.  Risks outweigh benefit. ? ?13.  Mild asymptomatic transaminitis.  Hold statin, trend is improving and nonacute right mid quadrant ultrasound. -ve Ac.Hep.panel. ? ?14.  2 hepatic cyst noted on right upper quadrant ultrasound.  Outpatient liver MRI and GI follow-up. ? ?15. Ur. Retention - Foley + Flomax 01/05/22, outpt Urology follow up. ? ?   ? ?Condition - Fair ? ?Family Communication  :  None present ? ?Code Status :  Full ? ?Consults  :  None ? ?PUD Prophylaxis : PPI ? ? Procedures  :    ? ?CT Head - Non acute ? ?RUQ [place] - Sludge noted within gallbladder lumen. Increased echogenicity of hepatic parenchyma is noted suggesting hepatic steatosis. At least 2 hepatic cysts are noted. 1.9 x 1.1 cm hypoechoic area is noted along right hepatic periphery which may represent fatty sparing, but other pathology cannot be excluded. When the patient is clinically stable and able to follow directions and hold their breath (preferably as an outpatient) further evaluation with dedicated  abdominal MRI should be considered. ? ?   ? ?Disposition Plan  :   ? ?Status is: Inpatient ? ?DVT Prophylaxis  :   ? ?heparin injection 5,000 Units Start: 01/03/22 1730 ?  ? ?Lab Results  ?Component Value Date  ? PLT 125 (L) 01/07/2022  ? ? ?Diet :  ?Diet Order   ? ?       ?  DIET SOFT Room service appropriate? Yes; Fluid consistency: Nectar Thick  Diet effective now       ?  ? ?  ?  ? ?  ?  ? ?Inpatient Medications ? ?Scheduled Meds: ? albuterol  2.5 mg Nebulization BID  ? aspirin  81 mg Oral Daily  ? busPIRone  30 mg Oral BID  ? carvedilol  3.125 mg Oral BID WC  ? Chlorhexidine Gluconate Cloth  6 each Topical Daily  ? emtricitabine-tenofovir AF  1 tablet Oral Daily  ? heparin  5,000 Units Subcutaneous Q8H  ? LORazepam  0-4 mg Intravenous Q12H  ? Or  ? LORazepam  0-4 mg Oral Q12H  ? pantoprazole  40 mg Oral Daily  ? polyethylene glycol  17 g Oral Daily  ? QUEtiapine  50 mg Oral BID  ? sertraline  200 mg Oral Daily  ? tamsulosin  0.4 mg Oral Daily  ? [START ON 01/09/2022] thiamine  100 mg Oral Daily  ? umeclidinium-vilanterol  1 puff Inhalation QAC breakfast  ? ?Continuous Infusions: ? lactated ringers    ? thiamine injection 500 mg (01/06/22 0951)  ? ?PRN Meds:.albuterol, cloNIDine, haloperidol lactate, ondansetron **OR** ondansetron (ZOFRAN) IV, sodium phosphate, traMADol ? ?Antibiotics  :   ? ?Anti-infectives (From admission, onward)  ? ? Start     Dose/Rate Route Frequency Ordered Stop  ? 01/03/22 1745  emtricitabine-tenofovir AF (DESCOVY) 200-25 MG per tablet 1 tablet       ?  1 tablet Oral Daily 01/03/22 1738    ? ?  ? ? ? Time Spent in minutes  30 ? ? ?Lala Lund M.D on 01/07/2022 at 9:46 AM ? ?To page go to [email or web address]  ? ?[facility] -  Office  [phone] ? ?See all Orders from today for further details ? ? ? Objective:  ? ?Vitals:  ? 01/06/22 2008 01/06/22 2151 01/07/22 0448 01/07/22 0728  ?BP:  140/78 117/89   ?Pulse: 85  95 91  ?Resp: 16   17  ?Temp:  98.6 ?F (37 ?C) 97.9 ?F (36.6 ?C)    ?TempSrc:  Axillary    ?SpO2: 99%  100% 100%  ?Weight:      ?Height:      ? ? ?Wt Readings from Last 3 Encounters:  ?01/04/22 81.6 kg  ?08/04/21 86.9 kg  ?07/07/21 86.2 kg  ? ? ? ?Intake/Output Summary (Last 24 hours) at 01/07/2022 0946 ?Last data filed at 01/07/2022 0859 ?Gross per 24 hour  ?Intake 360 ml  ?Output 350 ml  ?Net 10 ml  ? ? ? ?Physical Exam ? ?Awake but pleasantly confused, minimal DTs, no focal neurological deficits moving all 4 extremities by himself, Foley in place ?[place].AT,PERRAL ?Supple Neck, No JVD,   ?Symmetrical Chest wall movement, Good air movement bilaterally, CTAB ?RRR,No Gallops, Rubs or new Murmurs,  ?+ve B.Sounds, Abd Soft, No tenderness,   ?No Cyanosis, Clubbing or edema  ? ? ? Data Review:  ? ? ?CBC ?Recent Labs  ?Lab 01/03/22 ?1415 01/04/22 ?0448 01/05/22 ?0403 01/06/22 ?1245 01/07/22 ?0330  ?WBC 10.3 8.0 7.7 5.7 7.3  ?HGB 18.0* 17.9* 17.0 16.7 16.4  ?HCT 52.2* 50.8 47.0 46.3 47.0  ?PLT 119* 107* 109* 96* 125*  ?MCV 91.4 91.7 89.7 90.1 91.4  ?MCH 31.5 32.3 32.4 32.5 31.9  ?MCHC 34.5 35.2 36.2* 36.1* 34.9  ?RDW 14.5 14.6 14.1 14.0 14.2  ?LYMPHSABS  --  2.5 2.6 2.0 2.2  ?MONOABS  --  1.0 1.0 0.7 1.0  ?EOSABS  --  0.2 0.2 0.1 0.2  ?BASOSABS  --  0.1 0.1 0.0 0.1  ? ? ?Electrolytes ?Recent Labs  ?Lab 01/03/22 ?1415 01/03/22 ?1758 01/04/22 ?0448 01/05/22 ?0403 01/06/22 ?8099 01/07/22 ?0330  ?NA 139  --  141 137 139 139  ?K 3.7  --  3.6 3.7 3.5 4.7  ?CL 102  --  104 105 107 107  ?CO2 22  --  27 22 24 26   ?GLUCOSE 143*  --  84 115* 104* 107*  ?BUN 29*  --  29* 31* 19 16  ?CREATININE 1.06 1.11 1.02 1.27* 0.87 1.11  ?CALCIUM 9.9  --  9.7 9.5 9.2 9.6  ?AST 115*  --  128* 94* 64* 56*  ?ALT 207*  --  213* 192* 160* 147*  ?ALKPHOS 55  --  83 38 25 05  ?BILITOT 2.3*  --  2.6* 1.8* 1.6* 1.3*  ?ALBUMIN 4.3  --  4.0 3.8 3.5 3.7  ?MG  --   --  2.2 2.1 2.0 2.3  ?TSH  --  1.738  --   --   --   --   ?AMMONIA 23  --   --   --   --   --   ?BNP  --   --  37.6 46.0 21.2 36.2   ? ? ?------------------------------------------------------------------------------------------------------------------ ?No results for input(s): CHOL, HDL, LDLCALC, TRIG, CHOLHDL, LDLDIRECT in the last 72 hours. ? ?Lab Results  ?Component Value

## 2022-01-07 NOTE — TOC Progression Note (Addendum)
Transition of Care (TOC) - Initial/Assessment Note  ? ? ?Patient Details  ?Name: [name] ?MRN: [id number] ?Date of Birth: [birth year] ? ?Transition of Care (TOC) CM/SW Contact:    ?Paulene Floor Cade Olberding, LCSWA ?Phone Number: ?01/07/2022, 5:03 PM ? ?Clinical Narrative:                 ?RE: [name] ?Date of Birth:  [birth year] ?Date: 01/07/2022 ? ?Please be advised that the above-named patient will require a short-term nursing home stay - anticipated 30 days or less for rehabilitation and strengthening.  The plan is for return home.    ? ?  ?  ? ? ?Patient Goals and CMS Choice ?  ?  ?  ? ?Expected Discharge Plan and Services ?  ?  ?  ?  ?  ?                ?  ?  ?  ?  ?  ?  ?  ?  ?  ?  ? ?Prior Living Arrangements/Services ?  ?  ?  ?       ?  ?  ?  ?  ? ?Activities of Daily Living ?Home Assistive Devices/Equipment: None ?  ? ?Permission Sought/Granted ?  ?  ?   ?   ?   ?   ? ?Emotional Assessment ?  ?  ?  ?  ?  ?  ? ?Admission diagnosis:  Disorientation [R41.0] ?AMS (altered mental status) [R41.82] ?Patient Active Problem List  ? Diagnosis Date Noted  ? DTs (delirium tremens) ([place]) 01/05/2022  ? AMS (altered mental status) 01/03/2022  ? Severe episode of recurrent major depressive disorder, without psychotic features ([place]) 07/31/2021  ? Pulmonary embolism ([place]) 07/30/2021  ? Alcohol abuse with intoxication ([place]) 07/30/2021  ? Suicidal ideation 07/30/2021  ? Long toenail 01/11/2021  ? Aortic atherosclerosis ([place])   ? Generalized anxiety disorder 11/18/2020  ? Moderate episode of recurrent major depressive disorder ([place]) 11/18/2020  ? Panic disorder 11/18/2020  ? Schizophrenia ([place]) 05/18/2020  ? Thiamine deficiency 12/10/2019  ? Screening-pulmonary TB 12/04/2019  ? Irritable bowel syndrome with diarrhea 07/01/2019  ? Type II diabetes mellitus with manifestations ([place]) 06/26/2019  ? De Quervain's tenosynovitis, left 04/11/2018  ? 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 Insomnia w/ sleep apnea 11/30/2016  ? Syphili,  latent 03/05/2016  ? Glaucoma suspect of both eyes 11/19/2015  ? Non-small cell carcinoma of lung, stage 1 ([place]) 10/12/2015  ? COPD GOLD II if use fev1/VC and still smoking  08/11/2015  ? High risk homosexual behavior 07/09/2014  ? PUD (peptic ulcer disease) 01/09/2013  ? Routine general medical examination at a health care facility 06/04/2012  ? Paranoid schizophrenia ([place]) 08/01/2011  ? DJD (degenerative joint disease) of knee 03/15/2011  ? Obstructive sleep apnea 11/29/2010  ? 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 Allergic rhinitis 03/17/2010  ? Smoking 12/14/2009  ? HYPERTENSION, BENIGN 10/05/2009  ? Nonischemic cardiomyopathy ([place]) 10/05/2009  ? PAD (peripheral artery disease) ([place]) 09/15/2009  ? Hx of adenomatous colonic polyps 12/03/2008  ? Hyperlipidemia with target LDL less than 130 06/23/2008  ? BPH associated with nocturia 06/23/2008  ? ?PCP:  [name], MD ?Pharmacy:   ?[place], [place] ?[address] ?[place] [address] ?Phone: [phone] Fax: [phone] ? ? ? ? ?Social Determinants of Health (SDOH) Interventions ?  ? ?Readmission Risk Interventions ?   ? View :  No data to display.  ?  ?  ?  ? ? ? ?

## 2022-01-08 DIAGNOSIS — F10931 Alcohol use, unspecified with withdrawal delirium: Secondary | ICD-10-CM | POA: Diagnosis not present

## 2022-01-08 LAB — COMPREHENSIVE METABOLIC PANEL
ALT: 99 U/L — ABNORMAL HIGH (ref 0–44)
AST: 33 U/L (ref 15–41)
Albumin: 3.1 g/dL — ABNORMAL LOW (ref 3.5–5.0)
Alkaline Phosphatase: 49 U/L (ref 38–126)
Anion gap: 5 (ref 5–15)
BUN: 12 mg/dL (ref 8–23)
CO2: 24 mmol/L (ref 22–32)
Calcium: 8.9 mg/dL (ref 8.9–10.3)
Chloride: 113 mmol/L — ABNORMAL HIGH (ref 98–111)
Creatinine, Ser: 0.87 mg/dL (ref 0.61–1.24)
GFR, Estimated: >60 mL/min (ref >60)
Glucose, Bld: 102 mg/dL — ABNORMAL HIGH (ref 70–99)
Potassium: 3.6 mmol/L (ref 3.5–5.1)
Sodium: 142 mmol/L (ref 135–145)
Total Bilirubin: 1 mg/dL (ref 0.3–1.2)
Total Protein: 6 g/dL — ABNORMAL LOW (ref 6.5–8.1)

## 2022-01-08 LAB — CBC WITH DIFFERENTIAL/PLATELET
Abs Immature Granulocytes: 0.03 10*3/uL (ref 0.00–0.07)
Basophils Absolute: 0.1 10*3/uL (ref 0.0–0.1)
Basophils Relative: 1 %
Eosinophils Absolute: 0.1 10*3/uL (ref 0.0–0.5)
Eosinophils Relative: 2 %
HCT: 41.2 % (ref 39.0–52.0)
Hemoglobin: 14.1 g/dL (ref 13.0–17.0)
Immature Granulocytes: 0 %
Lymphocytes Relative: 26 %
Lymphs Abs: 1.8 10*3/uL (ref 0.7–4.0)
MCH: 31.6 pg (ref 26.0–34.0)
MCHC: 34.2 g/dL (ref 30.0–36.0)
MCV: 92.4 fL (ref 80.0–100.0)
Monocytes Absolute: 0.9 10*3/uL (ref 0.1–1.0)
Monocytes Relative: 12 %
Neutro Abs: 4.1 10*3/uL (ref 1.7–7.7)
Neutrophils Relative %: 59 %
Platelets: 105 10*3/uL — ABNORMAL LOW (ref 150–400)
RBC: 4.46 MIL/uL (ref 4.22–5.81)
RDW: 14.2 % (ref 11.5–15.5)
WBC: 7 10*3/uL (ref 4.0–10.5)
nRBC: 0 % (ref 0.0–0.2)

## 2022-01-08 LAB — MAGNESIUM: Magnesium: 2.2 mg/dL (ref 1.7–2.4)

## 2022-01-08 MED ORDER — HALOPERIDOL LACTATE 5 MG/ML IJ SOLN
2.0000 mg | Freq: Four times a day (QID) | INTRAMUSCULAR | Status: DC | PRN
  Administered 2022-01-09 – 2022-01-10 (×2): 2 mg via INTRAMUSCULAR
  Filled 2022-01-08 (×2): qty 1

## 2022-01-08 MED ORDER — SALINE SPRAY 0.65 % NA SOLN
1.0000 | Freq: Two times a day (BID) | NASAL | Status: DC
  Administered 2022-01-08 – 2022-01-12 (×8): 1 via NASAL
  Filled 2022-01-08: qty 44

## 2022-01-08 NOTE — Progress Notes (Addendum)
?                                  PROGRESS NOTE                                             ?                                                                                                                     ?                                         ? ? Patient Demographics:  ? ? [name], is a 61 y.o. male, DOB - 12-01-1960, UXN:[id number] ? ?Outpatient Primary MD for the patient is [name], MD    LOS - 5  Admit date - 01/03/2022   ? ?Chief Complaint  ?Patient presents with  ? Altered Mental Status  ? Weakness  ?    ? ?Brief Narrative (HPI from H&P)    61 y.o. male with medical history significant for ongoing alcohol abuse, hypertension, COPD, paranoid schizophrenia, depression/anxiety, nonischemic cardiomyopathy, tobacco abuse, CAD, CHF, noncompliance with medications, recent small incidental PE in Nov 2022, who presented to the hospital after neighbors called EMS as they were concerned that patient was getting more confused and was having repeated falls at home. ? ? Subjective:  ? ?Patient in bed, appears comfortable, denies any headache, no fever, no chest pain or pressure, no shortness of breath , no abdominal pain. No new focal weakness. ? ? Assessment  & Plan :  ? ?History of alcohol abuse, currently in DTs with multiple falls at home.  Head CT unremarkable, no focal deficits, was placed on Librium along with CIWA protocol + high-dose IV thiamine for 5 days, counseled to quit alcohol. Resolved DTs, continue supportive care low-dose Seroquel and Haldol added as well as he also has underlying paranoid schizophrenia with frequent hallucinations. PT-OT >> SNF. ?  ?2.  Unsafe living conditions.  Social work input ?  ?3.  History of paranoid schizophrenia, depression - currently no suicidal ideation.  Continue Zoloft , Buspar + added Seroquel for hallucinations along with as needed Haldol. ?  ?4.  History of nonischemic cardiomyopathy chronic systolic heart failure EF  around 40%.  Currently appears compensated.  Continue Coreg and ARB while here. ?  ?5.  Dyslipidemia.  Holding statin due to #13 below. ?  ?6. COPD.  Stable no acute issues.  Supportive care. ?  ?7.  Past history of COVID-19 infection.  Supportive care. ?  ?8. Stage IA (T1a, N0, M0) non-small cell right upper lobe lung cancer, adenocarcinoma diagnosed in November 2016 ?S/P wedge resection right upper  lobe nodule, and thoracoscopic right upper lobectomy under the care of Dr. Roxan Hockey on 09/04/2015.  PCP to monitor. ?  ?9.  HIV prevention due to high risk sexual behavior.  Continue home medications outside the hospital, will get refills from his PCP or primary ID physician, stable CD4 count, undetectable HIV viral load. ?  ?10.  HTN.  On beta-blocker and placed on ARB. ?  ?11.  PAD.  Continue beta-blocker, statin and start aspirin for secondary prevention. ?  ?12.  Incidental small PE in November 2022.  No further anticoagulation due to ongoing alcohol abuse and multiple falls.  Risks outweigh benefit. ? ?13.  Mild asymptomatic transaminitis.  Hold statin, trend is improving and nonacute right mid quadrant ultrasound. -ve Ac.Hep.panel. ? ?14.  2 hepatic cyst noted on right upper quadrant ultrasound.  Outpatient liver MRI and GI follow-up. ? ?15. Ur. Retention - Foley + Flomax 01/05/22 DC Foley 01/08/2022 - monitor Bladder scans, outpt Urology follow up. ? ?   ? ?Condition - Fair ? ?Family Communication  :  None present ? ?Code Status :  Full ? ?Consults  :  None ? ?PUD Prophylaxis : PPI ? ? Procedures  :    ? ?CT Head - Non acute ? ?RUQ [place] - Sludge noted within gallbladder lumen. Increased echogenicity of hepatic parenchyma is noted suggesting hepatic steatosis. At least 2 hepatic cysts are noted. 1.9 x 1.1 cm hypoechoic area is noted along right hepatic periphery which may represent fatty sparing, but other pathology cannot be excluded. When the patient is clinically stable and able to follow directions and hold  their breath (preferably as an outpatient) further evaluation with dedicated abdominal MRI should be considered. ? ?   ? ?Disposition Plan  :   ? ?Status is: Inpatient ? ?DVT Prophylaxis  :   ? ?heparin injection 5,000 Units Start: 01/03/22 1730 ?  ? ?Lab Results  ?Component Value Date  ? PLT 105 (L) 01/08/2022  ? ? ?Diet :  ?Diet Order   ? ?       ?  DIET SOFT Room service appropriate? Yes; Fluid consistency: Nectar Thick  Diet effective now       ?  ? ?  ?  ? ?  ?  ? ?Inpatient Medications ? ?Scheduled Meds: ? aspirin  81 mg Oral Daily  ? busPIRone  30 mg Oral BID  ? carvedilol  3.125 mg Oral BID WC  ? Chlorhexidine Gluconate Cloth  6 each Topical Daily  ? emtricitabine-tenofovir AF  1 tablet Oral Daily  ? heparin  5,000 Units Subcutaneous Q8H  ? pantoprazole  40 mg Oral Daily  ? polyethylene glycol  17 g Oral Daily  ? QUEtiapine  50 mg Oral BID  ? sertraline  200 mg Oral Daily  ? tamsulosin  0.4 mg Oral Daily  ? [START ON 01/09/2022] thiamine  100 mg Oral Daily  ? umeclidinium-vilanterol  1 puff Inhalation QAC breakfast  ? ?Continuous Infusions: ? ? ?PRN Meds:.albuterol, cloNIDine, haloperidol lactate, ondansetron **OR** ondansetron (ZOFRAN) IV, sodium phosphate, traMADol ? ?Antibiotics  :   ? ?Anti-infectives (From admission, onward)  ? ? Start     Dose/Rate Route Frequency Ordered Stop  ? 01/03/22 1745  emtricitabine-tenofovir AF (DESCOVY) 200-25 MG per tablet 1 tablet       ? 1 tablet Oral Daily 01/03/22 1738    ? ?  ? ? ? Time Spent in minutes  30 ? ? ?Lala Lund M.D on 01/08/2022 at  10:49 AM ? ?To page go to [email or web address]  ? ?[facility] -  Office  [phone] ? ?See all Orders from today for further details ? ? ? Objective:  ? ?Vitals:  ? 01/07/22 1737 01/07/22 2049 01/08/22 0506 01/08/22 0829  ?BP: 106/78 121/78 132/82 128/79  ?Pulse: 85 75 80 80  ?Resp: 19 19 20 18   ?Temp: (!) 97.4 ?F (36.3 ?C) 98.8 ?F (37.1 ?C) 98.4 ?F (36.9 ?C)   ?TempSrc: Oral Axillary    ?SpO2: 98% 98% 98% 98%  ?Weight:       ?Height:      ? ? ?Wt Readings from Last 3 Encounters:  ?01/04/22 81.6 kg  ?08/04/21 86.9 kg  ?07/07/21 86.2 kg  ? ? ? ?Intake/Output Summary (Last 24 hours) at 01/08/2022 1049 ?Last data filed at 01/08/2022 3546 ?Gross per 24 hour  ?Intake 1130 ml  ?Output 750 ml  ?Net 380 ml  ? ? ? ?Physical Exam ? ?Awake but pleasantly confused, resolved DTs, no focal neurological deficits moving all 4 extremities by himself, Foley in place ?[place].AT,PERRAL ?Supple Neck, No JVD,   ?Symmetrical Chest wall movement, Good air movement bilaterally, CTAB ?RRR,No Gallops, Rubs or new Murmurs,  ?+ve B.Sounds, Abd Soft, No tenderness,   ?No Cyanosis, Clubbing or edema  ? ? ? ? Data Review:  ? ? ?CBC ?Recent Labs  ?Lab 01/04/22 ?0448 01/05/22 ?0403 01/06/22 ?5681 01/07/22 ?0330 01/08/22 ?0301  ?WBC 8.0 7.7 5.7 7.3 7.0  ?HGB 17.9* 17.0 16.7 16.4 14.1  ?HCT 50.8 47.0 46.3 47.0 41.2  ?PLT 107* 109* 96* 125* 105*  ?MCV 91.7 89.7 90.1 91.4 92.4  ?MCH 32.3 32.4 32.5 31.9 31.6  ?MCHC 35.2 36.2* 36.1* 34.9 34.2  ?RDW 14.6 14.1 14.0 14.2 14.2  ?LYMPHSABS 2.5 2.6 2.0 2.2 1.8  ?MONOABS 1.0 1.0 0.7 1.0 0.9  ?EOSABS 0.2 0.2 0.1 0.2 0.1  ?BASOSABS 0.1 0.1 0.0 0.1 0.1  ? ? ?Electrolytes ?Recent Labs  ?Lab 01/03/22 ?1415 01/03/22 ?1758 01/04/22 ?0448 01/05/22 ?0403 01/06/22 ?2751 01/07/22 ?0330 01/08/22 ?0301  ?NA 139  --  141 137 139 139 142  ?K 3.7  --  3.6 3.7 3.5 4.7 3.6  ?CL 102  --  104 105 107 107 113*  ?CO2 22  --  27 22 24 26 24   ?GLUCOSE 143*  --  84 115* 104* 107* 102*  ?BUN 29*  --  29* 31* 19 16 12   ?CREATININE 1.06 1.11 1.02 1.27* 0.87 1.11 0.87  ?CALCIUM 9.9  --  9.7 9.5 9.2 9.6 8.9  ?AST 115*  --  128* 94* 64* 56* 33  ?ALT 207*  --  213* 192* 160* 147* 99*  ?ALKPHOS 55  --  70 01 74 94 49  ?BILITOT 2.3*  --  2.6* 1.8* 1.6* 1.3* 1.0  ?ALBUMIN 4.3  --  4.0 3.8 3.5 3.7 3.1*  ?MG  --   --  2.2 2.1 2.0 2.3 2.2  ?TSH  --  1.738  --   --   --   --   --   ?AMMONIA 23  --   --   --   --   --   --   ?BNP  --   --  37.6 46.0 21.2 36.2  --    ? ? ?------------------------------------------------------------------------------------------------------------------ ?No results for input(s): CHOL, HDL, LDLCALC, TRIG, CHOLHDL, LDLDIRECT in the last 72 hours. ? ?L

## 2022-01-08 NOTE — Consult Note (Signed)
Patient removed IV. Made two unsuccessful attempts. Placed order for IV team. Patient refused IV stick from IV team. Will contact  ?MD.  ?

## 2022-01-08 NOTE — Progress Notes (Signed)
Foley catheter flushed with 18mL sterile water,  Urine returned. Bladder scan completed for 7mL. ?

## 2022-01-08 NOTE — Progress Notes (Signed)
Ok to stop Descovy for HIV PreP while here per Dr. Candiss Norse ? ?Onnie Boer, PharmD, BCIDP, AAHIVP, CPP ?Infectious Disease Pharmacist ?01/08/2022 10:56 AM ? ? ?

## 2022-01-09 DIAGNOSIS — F10931 Alcohol use, unspecified with withdrawal delirium: Secondary | ICD-10-CM | POA: Diagnosis not present

## 2022-01-09 LAB — COMPREHENSIVE METABOLIC PANEL WITH GFR
ALT: 75 U/L — ABNORMAL HIGH (ref 0–44)
AST: 25 U/L (ref 15–41)
Albumin: 3 g/dL — ABNORMAL LOW (ref 3.5–5.0)
Alkaline Phosphatase: 48 U/L (ref 38–126)
Anion gap: 5 (ref 5–15)
BUN: 10 mg/dL (ref 8–23)
CO2: 25 mmol/L (ref 22–32)
Calcium: 9 mg/dL (ref 8.9–10.3)
Chloride: 114 mmol/L — ABNORMAL HIGH (ref 98–111)
Creatinine, Ser: 0.87 mg/dL (ref 0.61–1.24)
GFR, Estimated: >60 mL/min
Glucose, Bld: 100 mg/dL — ABNORMAL HIGH (ref 70–99)
Potassium: 3.8 mmol/L (ref 3.5–5.1)
Sodium: 144 mmol/L (ref 135–145)
Total Bilirubin: 0.7 mg/dL (ref 0.3–1.2)
Total Protein: 5.9 g/dL — ABNORMAL LOW (ref 6.5–8.1)

## 2022-01-09 LAB — CBC WITH DIFFERENTIAL/PLATELET
Abs Immature Granulocytes: 0.03 10*3/uL (ref 0.00–0.07)
Basophils Absolute: 0 10*3/uL (ref 0.0–0.1)
Basophils Relative: 1 %
Eosinophils Absolute: 0.2 10*3/uL (ref 0.0–0.5)
Eosinophils Relative: 3 %
HCT: 41.4 % (ref 39.0–52.0)
Hemoglobin: 13.8 g/dL (ref 13.0–17.0)
Immature Granulocytes: 1 %
Lymphocytes Relative: 33 %
Lymphs Abs: 2.1 10*3/uL (ref 0.7–4.0)
MCH: 31.3 pg (ref 26.0–34.0)
MCHC: 33.3 g/dL (ref 30.0–36.0)
MCV: 93.9 fL (ref 80.0–100.0)
Monocytes Absolute: 0.8 10*3/uL (ref 0.1–1.0)
Monocytes Relative: 13 %
Neutro Abs: 3.2 10*3/uL (ref 1.7–7.7)
Neutrophils Relative %: 49 %
Platelets: 114 10*3/uL — ABNORMAL LOW (ref 150–400)
RBC: 4.41 MIL/uL (ref 4.22–5.81)
RDW: 14.6 % (ref 11.5–15.5)
WBC: 6.4 10*3/uL (ref 4.0–10.5)
nRBC: 0 % (ref 0.0–0.2)

## 2022-01-09 LAB — MAGNESIUM: Magnesium: 2.1 mg/dL (ref 1.7–2.4)

## 2022-01-09 MED ORDER — NICOTINE 21 MG/24HR TD PT24
21.0000 mg | MEDICATED_PATCH | Freq: Every day | TRANSDERMAL | Status: DC
  Administered 2022-01-09 – 2022-01-13 (×5): 21 mg via TRANSDERMAL
  Filled 2022-01-09 (×5): qty 1

## 2022-01-09 NOTE — Progress Notes (Signed)
Pt woke up verbally aggressive threatening staff balling up his fist and not following commands. Dr. Candiss Norse notified prn medication given and soft restraints reordered.  ?

## 2022-01-09 NOTE — Progress Notes (Signed)
?                                  PROGRESS NOTE                                             ?                                                                                                                     ?                                         ? ? Patient Demographics:  ? ? [name], is a 61 y.o. male, DOB - 1961-03-24, YBO:[id number] ? ?Outpatient Primary MD for the patient is [name], MD    LOS - 6  Admit date - 01/03/2022   ? ?Chief Complaint  ?Patient presents with  ? Altered Mental Status  ? Weakness  ?    ? ?Brief Narrative (HPI from H&P)    62 y.o. male with medical history significant for ongoing alcohol abuse, hypertension, COPD, paranoid schizophrenia, depression/anxiety, nonischemic cardiomyopathy, tobacco abuse, CAD, CHF, noncompliance with medications, recent small incidental PE in Nov 2022, who presented to the hospital after neighbors called EMS as they were concerned that patient was getting more confused and was having repeated falls at home. ? ? Subjective:  ? ?Patient in bed, appears comfortable, denies any headache, no fever, no chest pain or pressure, no shortness of breath , no abdominal pain. No new focal weakness. ? ? Assessment  & Plan :  ? ?History of alcohol abuse, currently in DTs with multiple falls at home.  Head CT unremarkable, no focal deficits, was placed on Librium along with CIWA protocol + high-dose IV thiamine for 5 days, counseled to quit alcohol. Resolved DTs, continue supportive care low-dose Seroquel and Haldol added as well as he also has underlying paranoid schizophrenia with frequent hallucinations. PT-OT >> SNF. ?  ?2.  Unsafe living conditions.  Social work input ?  ?3.  History of paranoid schizophrenia, depression - currently no suicidal ideation.  Continue Zoloft , Buspar + added Seroquel for hallucinations along with as needed Haldol. ?  ?4.  History of nonischemic cardiomyopathy chronic systolic heart failure EF  around 40%.  Currently appears compensated.  Continue Coreg and ARB while here. ?  ?5.  Dyslipidemia.  Holding statin due to #13 below. ?  ?6. COPD.  Stable no acute issues.  Supportive care. ?  ?7.  Past history of COVID-19 infection.  Supportive care. ?  ?8. Stage IA (T1a, N0, M0) non-small cell right upper lobe lung cancer, adenocarcinoma diagnosed in November 2016 ?S/P wedge resection right upper  lobe nodule, and thoracoscopic right upper lobectomy under the care of Dr. Roxan Hockey on 09/04/2015.  PCP to monitor. ?  ?9.  HIV prevention with Prophylactic Descovy due to high risk sexual behavior.  Continue home medications outside the hospital, will get refills from his PCP or primary ID physician, stable CD4 count, undetectable HIV viral load. ?  ?10.  HTN.  On beta-blocker and placed on ARB. ?  ?11.  PAD.  Continue beta-blocker, statin and start aspirin for secondary prevention. ?  ?12.  Incidental small PE in November 2022.  No further anticoagulation due to ongoing alcohol abuse and multiple falls.  Risks outweigh benefit. ? ?13.  Mild asymptomatic transaminitis.  Hold statin, trend is improving and nonacute right mid quadrant ultrasound. -ve Ac.Hep.panel. ? ?14.  2 hepatic cyst noted on right upper quadrant ultrasound.  Outpatient liver MRI and GI follow-up. ? ?15. Ur. Retention - Foley + Flomax 01/05/22, DC Foley 01/08/2022 -  stable Bladder scans, outpt Urology follow up. ? ?   ? ?Condition - Fair ? ?Family Communication  :  None present ? ?Code Status :  Full ? ?Consults  :  None ? ?PUD Prophylaxis : PPI ? ? Procedures  :    ? ?CT Head - Non acute ? ?RUQ [place] - Sludge noted within gallbladder lumen. Increased echogenicity of hepatic parenchyma is noted suggesting hepatic steatosis. At least 2 hepatic cysts are noted. 1.9 x 1.1 cm hypoechoic area is noted along right hepatic periphery which may represent fatty sparing, but other pathology cannot be excluded. When the patient is clinically stable and able to  follow directions and hold their breath (preferably as an outpatient) further evaluation with dedicated abdominal MRI should be considered. ? ?   ? ?Disposition Plan  :   ? ?Status is: Inpatient ? ?DVT Prophylaxis  :   ? ?heparin injection 5,000 Units Start: 01/03/22 1730 ?  ? ?Lab Results  ?Component Value Date  ? PLT 114 (L) 01/09/2022  ? ? ?Diet :  ?Diet Order   ? ?       ?  DIET SOFT Room service appropriate? Yes; Fluid consistency: Nectar Thick  Diet effective now       ?  ? ?  ?  ? ?  ?  ? ?Inpatient Medications ? ?Scheduled Meds: ? aspirin  81 mg Oral Daily  ? busPIRone  30 mg Oral BID  ? carvedilol  3.125 mg Oral BID WC  ? heparin  5,000 Units Subcutaneous Q8H  ? nicotine  21 mg Transdermal Daily  ? pantoprazole  40 mg Oral Daily  ? polyethylene glycol  17 g Oral Daily  ? QUEtiapine  50 mg Oral BID  ? sertraline  200 mg Oral Daily  ? sodium chloride  1 spray Each Nare BID  ? tamsulosin  0.4 mg Oral Daily  ? thiamine  100 mg Oral Daily  ? umeclidinium-vilanterol  1 puff Inhalation QAC breakfast  ? ?Continuous Infusions: ? ? ?PRN Meds:.albuterol, cloNIDine, haloperidol lactate, ondansetron **OR** ondansetron (ZOFRAN) IV, sodium phosphate, traMADol ? ?Antibiotics  :   ? ?Anti-infectives (From admission, onward)  ? ? Start     Dose/Rate Route Frequency Ordered Stop  ? 01/03/22 1745  emtricitabine-tenofovir AF (DESCOVY) 200-25 MG per tablet 1 tablet  Status:  Discontinued       ? 1 tablet Oral Daily 01/03/22 1738 01/08/22 1055  ? ?  ? ? ? Time Spent in minutes  30 ? ? ?Lala Lund  M.D on 01/09/2022 at 10:19 AM ? ?To page go to [email or web address]  ? ?[facility] -  Office  [phone] ? ?See all Orders from today for further details ? ? ? Objective:  ? ?Vitals:  ? 01/08/22 1648 01/08/22 2008 01/09/22 0405 01/09/22 0823  ?BP: 115/74 122/76 (!) 112/59 105/71  ?Pulse: 82 77 72 80  ?Resp: 17 18 20 18   ?Temp: 97.8 ?F (36.6 ?C) 98.6 ?F (37 ?C) (!) 97.4 ?F (36.3 ?C) 97.6 ?F (36.4 ?C)  ?TempSrc: Oral  Oral Oral   ?SpO2: 100% 99% 100% 100%  ?Weight:      ?Height:      ? ? ?Wt Readings from Last 3 Encounters:  ?01/04/22 81.6 kg  ?08/04/21 86.9 kg  ?07/07/21 86.2 kg  ? ? ? ?Intake/Output Summary (Last 24 hours) at 01/09/2022 1019 ?Last data filed at 01/09/2022 0755 ?Gross per 24 hour  ?Intake 460 ml  ?Output 450 ml  ?Net 10 ml  ? ? ? ?Physical Exam ? ?Awake but pleasantly confused, resolved DTs, no focal neurological deficits moving all 4 extremities by himself,  ? ?.AT,PERRAL ?Supple Neck, No JVD,   ?Symmetrical Chest wall movement, Good air movement bilaterally, CTAB ?RRR,No Gallops, Rubs or new Murmurs,  ?+ve B.Sounds, Abd Soft, No tenderness,   ?No Cyanosis, Clubbing or edema  ? ? ? ? Data Review:  ? ? ?CBC ?Recent Labs  ?Lab 01/05/22 ?0403 01/06/22 ?1749 01/07/22 ?0330 01/08/22 ?0301 01/09/22 ?4496  ?WBC 7.7 5.7 7.3 7.0 6.4  ?HGB 17.0 16.7 16.4 14.1 13.8  ?HCT 47.0 46.3 47.0 41.2 41.4  ?PLT 109* 96* 125* 105* 114*  ?MCV 89.7 90.1 91.4 92.4 93.9  ?MCH 32.4 32.5 31.9 31.6 31.3  ?MCHC 36.2* 36.1* 34.9 34.2 33.3  ?RDW 14.1 14.0 14.2 14.2 14.6  ?LYMPHSABS 2.6 2.0 2.2 1.8 2.1  ?MONOABS 1.0 0.7 1.0 0.9 0.8  ?EOSABS 0.2 0.1 0.2 0.1 0.2  ?BASOSABS 0.1 0.0 0.1 0.1 0.0  ? ? ?Electrolytes ?Recent Labs  ?Lab 01/03/22 ?1415 01/03/22 ?1415 01/03/22 ?1758 01/04/22 ?0448 01/05/22 ?0403 01/06/22 ?7591 01/07/22 ?0330 01/08/22 ?0301 01/09/22 ?6384  ?NA 139  --   --  141 137 139 139 142 144  ?K 3.7  --   --  3.6 3.7 3.5 4.7 3.6 3.8  ?CL 102  --   --  104 105 107 107 113* 114*  ?CO2 22  --   --  27 22 24 26 24 25   ?GLUCOSE 143*  --   --  84 115* 104* 107* 102* 100*  ?BUN 29*  --   --  29* 31* 19 16 12 10   ?CREATININE 1.06  --  1.11 1.02 1.27* 0.87 1.11 0.87 0.87  ?CALCIUM 9.9  --   --  9.7 9.5 9.2 9.6 8.9 9.0  ?AST 115*  --   --  128* 94* 64* 56* 33 25  ?ALT 207*  --   --  213* 192* 160* 147* 99* 75*  ?ALKPHOS 55  --   --  66 59 93 57 01 77  ?BILITOT 2.3*  --   --  2.6* 1.8* 1.6* 1.3* 1.0 0.7  ?ALBUMIN 4.3  --   --  4.0 3.8 3.5 3.7 3.1* 3.0*   ?MG  --    < >  --  2.2 2.1 2.0 2.3 2.2 2.1  ?TSH  --   --  1.738  --   --   --   --   --   --   ?  AMMONIA 23  --   --   --   --   --   --   --   --   ?BNP  --   --   --  37.6 46.0 21.2 36.2  --   --   ? < > = v

## 2022-01-10 DIAGNOSIS — F10931 Alcohol use, unspecified with withdrawal delirium: Secondary | ICD-10-CM | POA: Diagnosis not present

## 2022-01-10 MED ORDER — QUETIAPINE FUMARATE 100 MG PO TABS
100.0000 mg | ORAL_TABLET | Freq: Every morning | ORAL | Status: DC
  Administered 2022-01-10 – 2022-01-13 (×4): 100 mg via ORAL
  Filled 2022-01-10 (×3): qty 1

## 2022-01-10 MED ORDER — QUETIAPINE FUMARATE 50 MG PO TABS
50.0000 mg | ORAL_TABLET | Freq: Every morning | ORAL | Status: DC
  Filled 2022-01-10: qty 1

## 2022-01-10 MED ORDER — QUETIAPINE FUMARATE 100 MG PO TABS
100.0000 mg | ORAL_TABLET | Freq: Every evening | ORAL | Status: DC
  Administered 2022-01-10 – 2022-01-12 (×3): 100 mg via ORAL
  Filled 2022-01-10 (×4): qty 1

## 2022-01-10 MED ORDER — HALOPERIDOL LACTATE 5 MG/ML IJ SOLN
5.0000 mg | Freq: Four times a day (QID) | INTRAMUSCULAR | Status: DC | PRN
  Administered 2022-01-10 – 2022-01-13 (×6): 5 mg via INTRAMUSCULAR
  Filled 2022-01-10 (×7): qty 1

## 2022-01-10 NOTE — TOC Progression Note (Addendum)
Transition of Care (TOC) - Initial/Assessment Note  ? ? ?Patient Details  ?Name: [name] ?MRN: [id number] ?Date of Birth: 06/16/1961 ? ?Transition of Care (TOC) CM/SW Contact:    ?Paulene Floor Brithany Whitworth, LCSWA ?Phone Number: ?01/10/2022, 11:24 AM ? ?Clinical Narrative:                 ? ?CSW received notification from attending that that restraints have been removed and the patient should be medically ready to d/c tomorrow.  CSW attempted to speak with the patient's POA and there was no answer.  CSW left a VM requesting a returned call. ? ?CSW faxed the patient's referral out to SNF agencies and requested that [facility] CMA's begin insurance authorization with the facility pending.   ? ?14:30-  CSW  received a returned call from the patient's POA.  CSW explained the insurance authorization process, and presented bed offers.  The POA is in  agreement with the patient discharging to [facility] when medically ready. ? ?CSW contacted Abigail Butts with admissions at [facility] the facility can accept the patient.   ? ?TOC CMA's were given facility choice so that insurance authorization could be updated. ? ?  ?  ? ? ?Patient Goals and CMS Choice ?  ?  ?  ? ?Expected Discharge Plan and Services ?  ?  ?  ?  ?  ?                ?  ?  ?  ?  ?  ?  ?  ?  ?  ?  ? ?Prior Living Arrangements/Services ?  ?  ?  ?       ?  ?  ?  ?  ? ?Activities of Daily Living ?Home Assistive Devices/Equipment: None ?ADL Screening (condition at time of admission) ?Patient's cognitive ability adequate to safely complete daily activities?: No ?Is the patient deaf or have difficulty hearing?: No ?Does the patient have difficulty seeing, even when wearing glasses/contacts?: No ?Does the patient have difficulty concentrating, remembering, or making decisions?: Yes ?Patient able to express need for assistance with ADLs?: Yes ?Does the patient have difficulty dressing or bathing?: Yes ?Independently performs ADLs?: No ?Communication: Independent ?Dressing (OT):  Needs assistance ?Is this a change from baseline?: Change from baseline, expected to last <3days ?Grooming: Needs assistance ?Is this a change from baseline?: Change from baseline, expected to last <3 days ?Feeding: Needs assistance ?Is this a change from baseline?: Change from baseline, expected to last >3 days ?Bathing: Needs assistance ?Is this a change from baseline?: Change from baseline, expected to last <3 days ?Toileting: Needs assistance ?Is this a change from baseline?: Change from baseline, expected to last <3 days ?In/Out Bed: Needs assistance ?Is this a change from baseline?: Change from baseline, expected to last <3 days ?Walks in Home: Needs assistance ?Is this a change from baseline?: Change from baseline, expected to last <3 days ?Does the patient have difficulty walking or climbing stairs?: Yes ?Weakness of Legs: Both ?Weakness of Arms/Hands: Both ? ?Permission Sought/Granted ?  ?  ?   ?   ?   ?   ? ?Emotional Assessment ?  ?  ?  ?  ?  ?  ? ?Admission diagnosis:  Disorientation [R41.0] ?AMS (altered mental status) [R41.82] ?Patient Active Problem List  ? Diagnosis Date Noted  ? DTs (delirium tremens) ([place]) 01/05/2022  ? AMS (altered mental status) 01/03/2022  ? Severe episode of recurrent major depressive disorder, without psychotic features ([place])  07/31/2021  ? Pulmonary embolism ([place]) 07/30/2021  ? Alcohol abuse with intoxication ([place]) 07/30/2021  ? Suicidal ideation 07/30/2021  ? Long toenail 01/11/2021  ? Aortic atherosclerosis ([place])   ? Generalized anxiety disorder 11/18/2020  ? Moderate episode of recurrent major depressive disorder ([place]) 11/18/2020  ? Panic disorder 11/18/2020  ? Schizophrenia ([place]) 05/18/2020  ? Thiamine deficiency 12/10/2019  ? Screening-pulmonary TB 12/04/2019  ? Irritable bowel syndrome with diarrhea 07/01/2019  ? Type II diabetes mellitus with manifestations ([place]) 06/26/2019  ? De Quervain's tenosynovitis, left 04/11/2018  ? 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  Insomnia w/ sleep apnea 11/30/2016  ? Syphili, latent 03/05/2016  ? Glaucoma suspect of both eyes 11/19/2015  ? Non-small cell carcinoma of lung, stage 1 ([place]) 10/12/2015  ? COPD GOLD II if use fev1/VC and still smoking  08/11/2015  ? High risk homosexual behavior 07/09/2014  ? PUD (peptic ulcer disease) 01/09/2013  ? Routine general medical examination at a health care facility 06/04/2012  ? Paranoid schizophrenia ([place]) 08/01/2011  ? DJD (degenerative joint disease) of knee 03/15/2011  ? Obstructive sleep apnea 11/29/2010  ? 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 Allergic rhinitis 03/17/2010  ? Smoking 12/14/2009  ? HYPERTENSION, BENIGN 10/05/2009  ? Nonischemic cardiomyopathy ([place]) 10/05/2009  ? PAD (peripheral artery disease) ([place]) 09/15/2009  ? Hx of adenomatous colonic polyps 12/03/2008  ? Hyperlipidemia with target LDL less than 130 06/23/2008  ? BPH associated with nocturia 06/23/2008  ? ?PCP:  [name], MD ?Pharmacy:   ?[facility] [place] ?[address] ?[place] [address] ?Phone: [phone] Fax: [phone] ? ? ? ? ?Social Determinants of Health (SDOH) Interventions ?  ? ?Readmission Risk Interventions ?   ? View : No data to display.  ?  ?  ?  ? ? ? ?

## 2022-01-10 NOTE — Progress Notes (Signed)
Physical Therapy Treatment ?Patient Details ?Name: [name] ?MRN: [id number] ?DOB: 08/01/1961 ?Today's Date: 01/10/2022 ? ? ?History of Present Illness 61 y.o. male admitted 4/10 who presented to the hospital after neighbors called EMS as they were concerned that patient was getting more confused and was having repeated falls at home.  PMH: ongoing  alcohol abuse, hypertension, COPD, paranoid schizophrenia, depression/anxiety, nonischemic cardiomyopathy, tobacco abuse, CAD, CHF, noncompliance with medications, recent small incidental PE in Nov 2022 ? ?  ?PT Comments  ? ? Pt pleasant and cooperative throughout PT session, although does demonstrate mildly impulsive behaviors. Pt initially requiring min assist for limited room ambulation with no assistive device; progressing to min guard assist with use of RW. Pt with poor cognition, displays decreased orientation, memory, and awareness. Pt A&O to self only. Presents as a high fall risk based on decreased gait speed, history of falls, and decreased safety awareness. Continue to recommend SNF for ongoing Physical Therapy.   ?   ?Recommendations for follow up therapy are one component of a multi-disciplinary discharge planning process, led by the attending physician.  Recommendations may be updated based on patient status, additional functional criteria and insurance authorization. ? ?Follow Up Recommendations ? Skilled nursing-short term rehab (<3 hours/day) ?  ?  ?Assistance Recommended at Discharge Frequent or constant Supervision/Assistance  ?Patient can return home with the following A little help with walking and/or transfers;A little help with bathing/dressing/bathroom;Assistance with cooking/housework;Direct supervision/assist for medications management;Direct supervision/assist for financial management;Assist for transportation;Help with stairs or ramp for entrance ?  ?Equipment Recommendations ? Rolling walker (2 wheels)  ?  ?Recommendations for Other  Services   ? ? ?  ?Precautions / Restrictions Precautions ?Precautions: Fall ?Restrictions ?Weight Bearing Restrictions: No  ?  ? ?Mobility ? Bed Mobility ?Overal bed mobility: Needs Assistance ?Bed Mobility: Supine to Sit, Sit to Supine ?  ?  ?Supine to sit: Supervision ?Sit to supine: Supervision ?  ?General bed mobility comments: supervision for safety ?  ? ?Transfers ?Overall transfer level: Needs assistance ?Equipment used: None ?Transfers: Sit to/from Stand ?Sit to Stand: Min guard ?  ?  ?  ?  ?  ?General transfer comment: close min guard assist ?  ? ?Ambulation/Gait ?Ambulation/Gait assistance: Min guard, Min assist ?Gait Distance (Feet): 100 Feet (100 ft, then additional 100 ft) ?Assistive device: Rolling walker (2 wheels), None ?Gait Pattern/deviations: Step-through pattern, Decreased stride length, Narrow base of support, Scissoring ?Gait velocity: decreased ?  ?  ?General Gait Details: Pt initially requiring minA for balance with no AD, progressing to min guard assist with RW. Demonstrates scissoring and narrow BOS, increased trunk flexion. needs cues for activity pacing ? ? ?Stairs ?  ?  ?  ?  ?  ? ? ?Wheelchair Mobility ?  ? ?Modified Rankin (Stroke Patients Only) ?  ? ? ?  ?Balance Overall balance assessment: Needs assistance ?Sitting-balance support: Feet supported ?Sitting balance-Leahy Scale: Fair ?  ?  ?Standing balance support: No upper extremity supported, During functional activity ?Standing balance-Leahy Scale: Poor ?  ?  ?  ?  ?  ?  ?  ?  ?  ?  ?  ?  ?  ? ?  ?Cognition Arousal/Alertness: Awake/alert ?Behavior During Therapy: Impulsive ?Overall Cognitive Status: Impaired/Different from baseline ?Area of Impairment: Orientation, Memory, Following commands, Safety/judgement, Awareness, Problem solving ?  ?  ?  ?  ?  ?  ?  ?  ?Orientation Level: Disoriented to, Place, Time, Situation ?  ?Memory: Decreased recall  of precautions, Decreased short-term memory ?Following Commands: Follows one step  commands consistently ?Safety/Judgement: Decreased awareness of safety, Decreased awareness of deficits ?Awareness: Intellectual ?  ?General Comments: Pt reporting it was February and could not recall year. Poor STM recall ?  ?  ? ?  ?Exercises   ? ?  ?General Comments   ?  ?  ? ?Pertinent Vitals/Pain Pain Assessment ?Pain Assessment: Faces ?Faces Pain Scale: No hurt  ? ? ?Home Living   ?  ?  ?  ?  ?  ?  ?  ?  ?  ?   ?  ?Prior Function    ?  ?  ?   ? ?PT Goals (current goals can now be found in the care plan section) Acute Rehab PT Goals ?Patient Stated Goal: to go home ?Potential to Achieve Goals: Good ?Progress towards PT goals: Progressing toward goals ? ?  ?Frequency ? ? ? Min 2X/week ? ? ? ?  ?PT Plan Frequency needs to be updated  ? ? ?Co-evaluation   ?  ?  ?  ?  ? ?  ?AM-PAC PT "6 Clicks" Mobility   ?Outcome Measure ? Help needed turning from your back to your side while in a flat bed without using bedrails?: A Little ?Help needed moving from lying on your back to sitting on the side of a flat bed without using bedrails?: A Little ?Help needed moving to and from a bed to a chair (including a wheelchair)?: A Little ?Help needed standing up from a chair using your arms (e.g., wheelchair or bedside chair)?: A Little ?Help needed to walk in hospital room?: A Little ?Help needed climbing 3-5 steps with a railing? : A Lot ?6 Click Score: 17 ? ?  ?End of Session Equipment Utilized During Treatment: Gait belt ?Activity Tolerance: Patient tolerated treatment well ?Patient left: in bed;with call bell/phone within reach;with bed alarm set;with restraints reapplied ?Nurse Communication: Mobility status ?PT Visit Diagnosis: Unsteadiness on feet (R26.81) ?  ? ? ?Time: 1194-1740 ?PT Time Calculation (min) (ACUTE ONLY): 10 min ? ?Charges:  $Therapeutic Activity: 8-22 mins          ?          ? ?Wyona Almas, PT, DPT ?Acute Rehabilitation Services ?Pager [phone] ?Office [phone] ? ? ? ?Carloine Margo Aye ?01/10/2022,  2:21 PM ? ?

## 2022-01-10 NOTE — Progress Notes (Signed)
?                                  PROGRESS NOTE                                             ?                                                                                                                     ?                                         ? ? Patient Demographics:  ? ? [name], is a 61 y.o. male, DOB - 1961/09/20, DGU:[id number] ? ?Outpatient Primary MD for the patient is Ronnie Lima, MD    LOS - 7  Admit date - 01/03/2022   ? ?Chief Complaint  ?Patient presents with  ? Altered Mental Status  ? Weakness  ?    ? ?Brief Narrative (HPI from H&P)    61 y.o. male with medical history significant for ongoing alcohol abuse, hypertension, COPD, paranoid schizophrenia, depression/anxiety, nonischemic cardiomyopathy, tobacco abuse, CAD, CHF, noncompliance with medications, recent small incidental PE in Nov 2022, who presented to the hospital after neighbors called EMS as they were concerned that patient was getting more confused and was having repeated falls at home. ? ? Subjective:  ? ?Patient in bed comfortable but slightly confused, no headache chest or abdominal pain. ? ? Assessment  & Plan :  ? ?History of alcohol abuse, currently in DTs with multiple falls at home.  Head CT unremarkable, no focal deficits, was placed on Librium along with CIWA protocol + high-dose IV thiamine for 5 days, counseled to quit alcohol. Resolved DTs, continue supportive care low-dose Seroquel and Haldol added as well as he also has underlying paranoid schizophrenia with frequent hallucinations. PT- OT >> SNF. ?  ?2.  Unsafe living conditions.  Social work input ?  ?3.  History of paranoid schizophrenia, depression - currently no suicidal ideation.  Continue Zoloft , Buspar + added Seroquel for episodes of hallucinations and agitation along with as needed Haldol.  Gradually improving continue to monitor. ?  ?4.  History of nonischemic cardiomyopathy chronic systolic heart failure EF around  40%.  Currently appears compensated.  Continue Coreg and ARB while here. ?  ?5.  Dyslipidemia.  Holding statin due to #13 below. ?  ?6. COPD.  Stable no acute issues.  Supportive care. ?  ?7.  Past history of COVID-19 infection.  Supportive care. ?  ?8. Stage IA (T1a, N0, M0) non-small cell right upper lobe lung cancer, adenocarcinoma diagnosed in November 2016 ?S/P wedge resection right upper lobe nodule, and  thoracoscopic right upper lobectomy under the care of Dr. Roxan Hockey on 09/04/2015.  PCP to monitor. ?  ?9.  HIV prevention with Prophylactic [place] due to high risk sexual behavior.  Continue home medications outside the hospital, will get refills from his PCP or primary ID physician, stable CD4 count, undetectable HIV viral load. ?  ?10.  HTN.  On beta-blocker and placed on ARB. ?  ?11.  PAD.  Continue beta-blocker, statin and start aspirin for secondary prevention. ?  ?12.  Incidental small PE in November 2022.  No further anticoagulation due to ongoing alcohol abuse and multiple falls.  Risks outweigh benefit. ? ?13.  Mild asymptomatic transaminitis.  Hold statin, trend is improving and nonacute right mid quadrant ultrasound. -ve Ac.Hep.panel. ? ?14.  2 hepatic cyst noted on right upper quadrant ultrasound.  Outpatient liver MRI and GI follow-up. ? ?15. Ur. Retention - Foley + Flomax 01/05/22, DC Foley 01/08/2022 -  stable Bladder scans, outpt Urology follow up. ? ?   ? ?Condition - Fair ? ?Family Communication  :  None present ? ?Code Status :  Full ? ?Consults  :  None ? ?PUD Prophylaxis : PPI ? ? Procedures  :    ? ?CT Head - Non acute ? ?RUQ [place] - Sludge noted within gallbladder lumen. Increased echogenicity of hepatic parenchyma is noted suggesting hepatic steatosis. At least 2 hepatic cysts are noted. 1.9 x 1.1 cm hypoechoic area is noted along right hepatic periphery which may represent fatty sparing, but other pathology cannot be excluded. When the patient is clinically stable and able to follow  directions and hold their breath (preferably as an outpatient) further evaluation with dedicated abdominal MRI should be considered. ? ?   ? ?Disposition Plan  :   ? ?Status is: Inpatient ? ?DVT Prophylaxis  :   ? ?heparin injection 5,000 Units Start: 01/03/22 1730 ?  ? ?Lab Results  ?Component Value Date  ? PLT 114 (L) 01/09/2022  ? ? ?Diet :  ?Diet Order   ? ?       ?  DIET SOFT Room service appropriate? Yes; Fluid consistency: Nectar Thick  Diet effective now       ?  ? ?  ?  ? ?  ?  ? ?Inpatient Medications ? ?Scheduled Meds: ? aspirin  81 mg Oral Daily  ? busPIRone  30 mg Oral BID  ? carvedilol  3.125 mg Oral BID WC  ? heparin  5,000 Units Subcutaneous Q8H  ? nicotine  21 mg Transdermal Daily  ? pantoprazole  40 mg Oral Daily  ? polyethylene glycol  17 g Oral Daily  ? QUEtiapine  100 mg Oral QPM  ? QUEtiapine  100 mg Oral q morning  ? sertraline  200 mg Oral Daily  ? sodium chloride  1 spray Each Nare BID  ? tamsulosin  0.4 mg Oral Daily  ? thiamine  100 mg Oral Daily  ? umeclidinium-vilanterol  1 puff Inhalation QAC breakfast  ? ?Continuous Infusions: ? ? ?PRN Meds:.albuterol, cloNIDine, haloperidol lactate, ondansetron **OR** ondansetron (ZOFRAN) IV, sodium phosphate, traMADol ? ?Antibiotics  :   ? ?Anti-infectives (From admission, onward)  ? ? Start     Dose/Rate Route Frequency Ordered Stop  ? 01/03/22 1745  emtricitabine-tenofovir AF (DESCOVY) 200-25 MG per tablet 1 tablet  Status:  Discontinued       ? 1 tablet Oral Daily 01/03/22 1738 01/08/22 1055  ? ?  ? ? ? Time Spent in minutes  30 ? ? ?Lala Lund M.D on 01/10/2022 at 11:00 AM ? ?To page go to [email or web address]  ? ?[facility] -  Office  [phone] ? ?See all Orders from today for further details ? ? ? Objective:  ? ?Vitals:  ? 01/09/22 1719 01/09/22 2011 01/10/22 0503 01/10/22 0930  ?BP: 110/72 116/75 125/85 107/74  ?Pulse: 97 83 66 87  ?Resp: 18 20 20 16   ?Temp: (!) 97.5 ?F (36.4 ?C) 98.6 ?F (37 ?C) 98 ?F (36.7 ?C) 97.6 ?F (36.4 ?C)   ?TempSrc:  Oral Oral   ?SpO2: 100% 100% 100% 99%  ?Weight:      ?Height:      ? ? ?Wt Readings from Last 3 Encounters:  ?01/04/22 81.6 kg  ?08/04/21 86.9 kg  ?07/07/21 86.2 kg  ? ? ? ?Intake/Output Summary (Last 24 hours) at 01/10/2022 1100 ?Last data filed at 01/10/2022 0600 ?Gross per 24 hour  ?Intake 700 ml  ?Output 0 ml  ?Net 700 ml  ? ? ? ?Physical Exam ? ?Awake but pleasantly confused, resolved DTs, no focal neurological deficits moving all 4 extremities by himself,  ? ?[place].AT,PERRAL ?Supple Neck, No JVD,   ?Symmetrical Chest wall movement, Good air movement bilaterally, CTAB ?RRR,No Gallops, Rubs or new Murmurs,  ?+ve B.Sounds, Abd Soft, No tenderness,   ?No Cyanosis, Clubbing or edema  ? ? ? Data Review:  ? ? ?CBC ?Recent Labs  ?Lab 01/05/22 ?0403 01/06/22 ?6144 01/07/22 ?0330 01/08/22 ?0301 01/09/22 ?3154  ?WBC 7.7 5.7 7.3 7.0 6.4  ?HGB 17.0 16.7 16.4 14.1 13.8  ?HCT 47.0 46.3 47.0 41.2 41.4  ?PLT 109* 96* 125* 105* 114*  ?MCV 89.7 90.1 91.4 92.4 93.9  ?MCH 32.4 32.5 31.9 31.6 31.3  ?MCHC 36.2* 36.1* 34.9 34.2 33.3  ?RDW 14.1 14.0 14.2 14.2 14.6  ?LYMPHSABS 2.6 2.0 2.2 1.8 2.1  ?MONOABS 1.0 0.7 1.0 0.9 0.8  ?EOSABS 0.2 0.1 0.2 0.1 0.2  ?BASOSABS 0.1 0.0 0.1 0.1 0.0  ? ? ?Electrolytes ?Recent Labs  ?Lab 01/03/22 ?1415 01/03/22 ?1415 01/03/22 ?1758 01/04/22 ?0448 01/05/22 ?0403 01/06/22 ?0086 01/07/22 ?0330 01/08/22 ?0301 01/09/22 ?7619  ?NA 139  --   --  141 137 139 139 142 144  ?K 3.7  --   --  3.6 3.7 3.5 4.7 3.6 3.8  ?CL 102  --   --  104 105 107 107 113* 114*  ?CO2 22  --   --  27 22 24 26 24 25   ?GLUCOSE 143*  --   --  84 115* 104* 107* 102* 100*  ?BUN 29*  --   --  29* 31* 19 16 12 10   ?CREATININE 1.06  --  1.11 1.02 1.27* 0.87 1.11 0.87 0.87  ?CALCIUM 9.9  --   --  9.7 9.5 9.2 9.6 8.9 9.0  ?AST 115*  --   --  128* 94* 64* 56* 33 25  ?ALT 207*  --   --  213* 192* 160* 147* 99* 75*  ?ALKPHOS 55  --   --  50 93 26 71 24 58  ?BILITOT 2.3*  --   --  2.6* 1.8* 1.6* 1.3* 1.0 0.7  ?ALBUMIN 4.3  --   --  4.0 3.8  3.5 3.7 3.1* 3.0*  ?MG  --    < >  --  2.2 2.1 2.0 2.3 2.2 2.1  ?TSH  --   --  1.738  --   --   --   --   --   --   ?  AMMONIA 23  --   --   --   --   --   --   --   --   ?BNP  --   --   --  37.6 46.0 21.2 36.2

## 2022-01-11 DIAGNOSIS — F10931 Alcohol use, unspecified with withdrawal delirium: Secondary | ICD-10-CM | POA: Diagnosis not present

## 2022-01-11 MED ORDER — ASPIRIN 81 MG PO CHEW: 81.0000 mg | CHEWABLE_TABLET | Freq: Every day | ORAL | Status: AC

## 2022-01-11 MED ORDER — THIAMINE HCL 100 MG PO TABS: 100.0000 mg | ORAL_TABLET | Freq: Every day | ORAL | Status: AC

## 2022-01-11 MED ORDER — POLYETHYLENE GLYCOL 3350 17 G PO PACK: 17.0000 g | PACK | Freq: Every day | ORAL | 0 refills | Status: AC | PRN

## 2022-01-11 MED ORDER — NICOTINE 21 MG/24HR TD PT24: 21.0000 mg | MEDICATED_PATCH | Freq: Every day | TRANSDERMAL | 0 refills | Status: AC

## 2022-01-11 MED ORDER — TAMSULOSIN HCL 0.4 MG PO CAPS
0.4000 mg | ORAL_CAPSULE | Freq: Every day | ORAL | Status: AC
Start: 2022-01-12 — End: ?

## 2022-01-11 MED ORDER — QUETIAPINE FUMARATE 100 MG PO TABS: 100.0000 mg | ORAL_TABLET | Freq: Two times a day (BID) | ORAL | Status: DC

## 2022-01-11 MED ORDER — CARVEDILOL 3.125 MG PO TABS: 3.1250 mg | ORAL_TABLET | Freq: Two times a day (BID) | ORAL | Status: AC

## 2022-01-11 NOTE — Discharge Summary (Signed)
?                                                                                ? ?[name] VOJ:[id number] DOB: June 08, 1961 DOA: 01/03/2022 ? ?PCP: [name], MD ? ?Admit date: 01/03/2022  Discharge date: 01/11/2022 ? ?Admitted From: Home   Disposition:  SNF ? ? ?Recommendations for Outpatient Follow-up:  ? ?Follow up with PCP in 1-2 weeks ? ?PCP Please obtain BMP/CBC, 2 view CXR in 1week,  (see Discharge instructions)  ? ?PCP Please follow up on the following pending results: he needs outpatient GI and psych follow-up. ? ? ?Home Health: None   ?Equipment/Devices: None  ?Consultations: None  ?Discharge Condition: Stable    ?CODE STATUS: Full    ?Diet Recommendation: Heart Healthy ?  ? ?Chief Complaint  ?Patient presents with  ? Altered Mental Status  ? Weakness  ?  ? ?Brief history of present illness from the day of admission and additional interim summary   ? ? 61 y.o. male with medical history significant for ongoing alcohol abuse, hypertension, COPD, paranoid schizophrenia, depression/anxiety, nonischemic cardiomyopathy, tobacco abuse, CAD, CHF, noncompliance with medications, recent small incidental PE in Nov 2022, who presented to the hospital after neighbors called EMS as they were concerned that patient was getting more confused and was having repeated falls at home. ? ?                                                               Hospital Course  ? ?History of alcohol abuse, currently in DTs with multiple falls at home.  Head CT unremarkable, no focal deficits, was placed on Librium along with CIWA protocol + high-dose IV thiamine for 5 days, counseled to quit alcohol. Resolved DTs, continue supportive care low-dose Seroquel and Haldol added as well as he also has underlying paranoid schizophrenia with frequent hallucinations.  Remains quite deconditioned, continue PT- OT >> SNF. ?  ?2.  Unsafe living  conditions.  Social work input ?  ?3.  History of paranoid schizophrenia, depression - currently no suicidal ideation.  Continue Zoloft , Buspar + added Seroquel for episodes of hallucinations and agitation along with as needed Haldol.  Encephalopathy much improved, now at baseline mental status and calm, continue to monitor. ?  ?4.  History of nonischemic cardiomyopathy chronic systolic heart failure EF around 40%.  Currently appears compensated.  Continue Coreg for blood pressure too low for ACE/ARB or Entresto. ?  ?5.  Dyslipidemia.  Holding statin due to #13 below. ?  ?6. COPD.  Stable no acute issues.  Supportive care. ?  ?7.  Past history of COVID-19 infection.  Supportive care. ?  ?8. Stage IA (T1a, N0, M0) non-small cell right upper lobe lung cancer, adenocarcinoma diagnosed in November 2016 ?S/P wedge resection right upper lobe nodule, and thoracoscopic right upper lobectomy under the care of Dr. Roxan Hockey on 09/04/2015.  PCP to monitor. ?  ?9.  HIV prevention with Prophylactic  Descovy due to high risk sexual behavior.  Continue home medications outside the hospital, will get refills from his PCP or primary ID physician, stable CD4 count, undetectable HIV viral load. ?  ?10.  HTN.  On beta-blocker, blood pressure stable no room to add other medications like ACE/ARB or Entresto. ?  ?11.  PAD.  Continue beta-blocker, statin and start aspirin for secondary prevention. ?  ?12.  Incidental small PE in November 2022.  No further anticoagulation due to ongoing alcohol abuse and multiple falls.  Risks outweigh benefit.  As it is he said he was not taking Eliquis for months.    ?  ?13.  Mild asymptomatic transaminitis.  Hold statin, trend is improving and nonacute right mid quadrant ultrasound. -ve Ac.Hep.panel. ?  ?14.  2 hepatic cyst noted on right upper quadrant ultrasound.  Outpatient liver MRI and GI follow-up to be arranged by PCP. ?  ?15. Ur. Retention - Foley + Flomax 01/05/22, DC Foley 01/08/2022 -  stable  Bladder scans, outpt Urology follow up. ? ? ?Discharge diagnosis   ? ? ?Principal Problem: ?  DTs (delirium tremens) ([place]) ?Active Problems: ?  Hyperlipidemia with target LDL less than 130 ?  HYPERTENSION, BENIGN ?  Nonischemic cardiomyopathy ([place]) ?  PAD (peripheral artery disease) ([place]) ?  Paranoid schizophrenia ([place]) ?  Schizophrenia ([place]) ?  Alcohol abuse with intoxication ([place]) ?  AMS (altered mental status) ? ? ? ?Discharge instructions   ? ?Discharge Instructions   ? ? Discharge instructions   Complete by: As directed ?  ? Follow with Primary MD [name], MD in 7 days  ? ?Get CBC, CMP, 2 view Chest X ray -  checked next visit within 1 week by SNF MD  ? ?Activity: As tolerated with Full fall precautions use walker/cane & assistance as needed ? ?Disposition SNF ? ?Diet: Heart Healthy  with feeding assistance and aspiration precautions. ?  ? ?Special Instructions: If you have smoked or chewed Tobacco  in the last 2 yrs please stop smoking, stop any regular Alcohol  and or any Recreational drug use. ? ?On your next visit with your primary care physician please Get Medicines reviewed and adjusted. ? ?Please request your Prim.MD to go over all Hospital Tests and Procedure/Radiological results at the follow up, please get all Hospital records sent to your Prim MD by signing hospital release before you go home. ? ?If you experience worsening of your admission symptoms, develop shortness of breath, life threatening emergency, suicidal or homicidal thoughts you must seek medical attention immediately by calling 911 or calling your MD immediately  if symptoms less severe. ? ?You Must read complete instructions/literature along with all the possible adverse reactions/side effects for all the Medicines you take and that have been prescribed to you. Take any new Medicines after you have completely understood and accpet all the possible adverse reactions/side effects.  ? Increase activity slowly   Complete by: As  directed ?  ? No wound care   Complete by: As directed ?  ? ?  ? ? ?Discharge Medications  ? ?Allergies as of 01/11/2022   ? ?   Reactions  ? Ace Inhibitors Cough  ? Aspirin Other (See Comments)  ? Reaction:  Nose bleeds and GI bleeding   ? Clopidogrel Bisulfate Other (See Comments)  ? Reaction:  Nose bleeds   ? Crestor [rosuvastatin Calcium] Other (See Comments)  ? Reaction:  Leg cramps   ? Metformin And Related Diarrhea  ?  Rosuvastatin Cough  ? ?  ? ?  ?Medication List  ?  ? ?STOP taking these medications   ? ?apixaban 5 MG Tabs tablet ?Commonly known as: ELIQUIS ?  ?ARIPiprazole 5 MG tablet ?Commonly known as: ABILIFY ?  ?cetirizine 10 MG tablet ?Commonly known as: ZYRTEC ?  ?Eliquis 5 MG Tabs tablet ?Generic drug: apixaban ?  ?Entresto 24-26 MG ?Generic drug: sacubitril-valsartan ?  ?fluticasone 50 MCG/ACT nasal spray ?Commonly known as: FLONASE ?  ?hydrOXYzine 50 MG tablet ?Commonly known as: ATARAX ?  ?traZODone 100 MG tablet ?Commonly known as: DESYREL ?  ? ?  ? ?TAKE these medications   ? ?acetaminophen 500 MG tablet ?Commonly known as: TYLENOL ?Take 1,000 mg by mouth 2 (two) times daily as needed for moderate pain. ?  ?albuterol (2.5 MG/3ML) 0.083% nebulizer solution ?Commonly known as: PROVENTIL ?Take 3 mLs (2.5 mg total) by nebulization every 6 (six) hours as needed for wheezing or shortness of breath. ?  ?albuterol 108 (90 Base) MCG/ACT inhaler ?Commonly known as: VENTOLIN HFA ?Inhale 2 puffs into the lungs every 4 (four) hours as needed for wheezing. ?  ?Anoro Ellipta 62.5-25 MCG/ACT Aepb ?Generic drug: umeclidinium-vilanterol ?Inhale 1 puff into the lungs daily. ?  ?aspirin 81 MG chewable tablet ?Chew 1 tablet (81 mg total) by mouth daily. ?Start taking on: January 12, 2022 ?  ?busPIRone 30 MG tablet ?Commonly known as: BUSPAR ?Take 1 tablet (30 mg total) by mouth 2 (two) times daily. ?  ?carvedilol 3.125 MG tablet ?Commonly known as: COREG ?Take 1 tablet (3.125 mg total) by mouth 2 (two) times daily with  a meal. ?What changed:  ?medication strength ?how much to take ?  ?Descovy 200-25 MG tablet ?Generic drug: emtricitabine-tenofovir AF ?Take 1 tablet by mouth daily. ?  ?donepezil 5 MG tablet ?Commonly known as:

## 2022-01-11 NOTE — Progress Notes (Signed)
Per social work, patient is unable to discharge today. ?

## 2022-01-11 NOTE — Care Management Important Message (Signed)
Important Message ? ?Patient Details  ?Name: [name] ?MRN: [id number] ?Date of Birth: 09-Dec-1960 ? ? ?Medicare Important Message Given:  Yes ? ? ? ? ?Samaiyah Howes ?01/11/2022, 4:10 PM ?

## 2022-01-11 NOTE — Discharge Instructions (Signed)
Follow with Primary MD [name], MD in 7 days  ? ?Get CBC, CMP, 2 view Chest X ray -  checked next visit within 1 week by SNF MD  ? ?Activity: As tolerated with Full fall precautions use walker/cane & assistance as needed ? ?Disposition SNF ? ?Diet: Heart Healthy  with feeding assistance and aspiration precautions. ?  ? ?Special Instructions: If you have smoked or chewed Tobacco  in the last 2 yrs please stop smoking, stop any regular Alcohol  and or any Recreational drug use. ? ?On your next visit with your primary care physician please Get Medicines reviewed and adjusted. ? ?Please request your Prim.MD to go over all Hospital Tests and Procedure/Radiological results at the follow up, please get all Hospital records sent to your Prim MD by signing hospital release before you go home. ? ?If you experience worsening of your admission symptoms, develop shortness of breath, life threatening emergency, suicidal or homicidal thoughts you must seek medical attention immediately by calling 911 or calling your MD immediately  if symptoms less severe. ? ?You Must read complete instructions/literature along with all the possible adverse reactions/side effects for all the Medicines you take and that have been prescribed to you. Take any new Medicines after you have completely understood and accpet all the possible adverse reactions/side effects.  ? ?  ? ?

## 2022-01-11 NOTE — Progress Notes (Signed)
Occupational Therapy Treatment ?Patient Details ?Name: [name] ?MRN: [id number] ?DOB: May 22, 1961 ?Today's Date: 01/11/2022 ? ? ?History of present illness 61 y.o. male admitted 4/10 who presented to the hospital after neighbors called EMS as they were concerned that patient was getting more confused and was having repeated falls at home.  PMH: ongoing  alcohol abuse, hypertension, COPD, paranoid schizophrenia, depression/anxiety, nonischemic cardiomyopathy, tobacco abuse, CAD, CHF, noncompliance with medications, recent small incidental PE in Nov 2022 ?  ?OT comments ? Pt  completed light sponge bath while sitting and standing at sink with min guard to min assistance due to safety with tasks. Pt required set up of all materials and cues on sequencing with task. Pt is making good progress to all goals but still limited due to decrease in recall, judgement and orientation. Pt currently with functional limitations due to the deficits listed below (see OT Problem List).  Pt will benefit from skilled OT to increase their safety and independence with ADL and functional mobility for ADL to facilitate discharge to venue listed below.  ?  ? ?Recommendations for follow up therapy are one component of a multi-disciplinary discharge planning process, led by the attending physician.  Recommendations may be updated based on patient status, additional functional criteria and insurance authorization. ?   ?Follow Up Recommendations ? Skilled nursing-short term rehab (<3 hours/day)  ?  ?Assistance Recommended at Discharge Frequent or constant Supervision/Assistance  ?Patient can return home with the following ? A lot of help with walking and/or transfers;A lot of help with bathing/dressing/bathroom;Direct supervision/assist for medications management;Help with stairs or ramp for entrance;Assist for transportation;Direct supervision/assist for financial management;Assistance with cooking/housework ?  ?Equipment Recommendations ?   (TBA at the next site of care)  ?  ?Recommendations for Other Services   ? ?  ?Precautions / Restrictions Precautions ?Precautions: Fall ?Restrictions ?Weight Bearing Restrictions: No  ? ? ?  ? ?Mobility Bed Mobility ?Overal bed mobility: Needs Assistance ?Bed Mobility: Supine to Sit, Sit to Supine ?  ?  ?Supine to sit: Min guard ?Sit to supine: Min guard ?  ?  ?  ? ?Transfers ?Overall transfer level: Needs assistance ?Equipment used: None ?Transfers: Sit to/from Stand ?Sit to Stand: Min guard ?  ?  ?  ?  ?  ?  ?  ?  ?Balance Overall balance assessment: Needs assistance ?Sitting-balance support: Feet supported ?Sitting balance-Leahy Scale: Fair ?  ?  ?Standing balance support: No upper extremity supported ?Standing balance-Leahy Scale: Poor ?Standing balance comment: noted one time to start to lean toR side with turning around ?  ?  ?  ?  ?  ?  ?  ?  ?  ?  ?  ?   ? ?ADL either performed or assessed with clinical judgement  ? ?ADL Overall ADL's : Needs assistance/impaired ?  ?  ?Grooming: Wash/dry hands;Wash/dry face;Oral care;Applying deodorant;Min guard;Sitting;Standing ?  ?Upper Body Bathing: Set up;Sitting ?  ?Lower Body Bathing: Min guard;Cueing for safety;Cueing for sequencing;Sit to/from stand ?  ?Upper Body Dressing : Min guard;Cueing for safety;Cueing for sequencing;Sitting;Standing ?  ?Lower Body Dressing: Minimal assistance;Cueing for safety;Cueing for sequencing;Sit to/from stand ?  ?Toilet Transfer: Min guard;Cueing for safety;Cueing for sequencing;Ambulation ?  ?Toileting- Clothing Manipulation and Hygiene: Minimal assistance;Cueing for safety;Cueing for sequencing;Sit to/from stand ?  ?  ?  ?Functional mobility during ADLs: Min guard;Cueing for safety;Cueing for sequencing (hand held assist) ?  ?  ? ?Extremity/Trunk Assessment Upper Extremity Assessment ?Upper Extremity Assessment: Generalized weakness ?LUE  Deficits / Details: wrapping around LUE due to pt attempting to pull onto IV ?  ?Lower Extremity  Assessment ?Lower Extremity Assessment: Defer to PT evaluation ?  ?  ?  ? ?Vision   ?  ?  ?Perception   ?  ?Praxis   ?  ? ?Cognition Arousal/Alertness: Awake/alert ?Behavior During Therapy: Impulsive ?Overall Cognitive Status: Impaired/Different from baseline ?Area of Impairment: Orientation, Attention, Memory, Following commands, Safety/judgement, Awareness, Problem solving ?  ?  ?  ?  ?  ?  ?  ?  ?Orientation Level: Disoriented to, Place, Time, Situation ?Current Attention Level: Focused ?Memory: Decreased recall of precautions, Decreased short-term memory ?Following Commands: Follows one step commands consistently ?Safety/Judgement: Decreased awareness of safety, Decreased awareness of deficits ?Awareness: Intellectual ?Problem Solving: Difficulty sequencing, Requires verbal cues ?General Comments: Pt reported they were in a nursing home but could not recall date ?  ?  ?   ?Exercises   ? ?  ?Shoulder Instructions   ? ? ?  ?General Comments    ? ? ?Pertinent Vitals/ Pain       Pain Assessment ?Pain Assessment: No/denies pain ? ?Home Living   ?  ?  ?  ?  ?  ?  ?  ?  ?  ?  ?  ?  ?  ?  ?  ?  ?  ?  ? ?  ?Prior Functioning/Environment    ?  ?  ?  ?   ? ?Frequency ? Min 2X/week  ? ? ? ? ?  ?Progress Toward Goals ? ?OT Goals(current goals can now be found in the care plan section) ? Progress towards OT goals: Progressing toward goals ? ?Acute Rehab OT Goals ?Patient Stated Goal: to get comfortable ?OT Goal Formulation: With patient ?Time For Goal Achievement: 01/19/22 ?Potential to Achieve Goals: Fair ?ADL Goals ?Pt Will Perform Grooming: with supervision;sitting ?Pt Will Perform Upper Body Dressing: with supervision;sitting ?Pt Will Transfer to Toilet: with supervision;stand pivot transfer;bedside commode ?Pt Will Perform Toileting - Clothing Manipulation and hygiene: with supervision;sit to/from stand;sitting/lateral leans  ?Plan Discharge plan remains appropriate   ? ?Co-evaluation ? ? ?   ?  ?  ?  ?  ? ?  ?AM-PAC OT  "6 Clicks" Daily Activity     ?Outcome Measure ? ? Help from another person eating meals?: A Little ?Help from another person taking care of personal grooming?: A Little ?Help from another person toileting, which includes using toliet, bedpan, or urinal?: A Little ?Help from another person bathing (including washing, rinsing, drying)?: A Little ?Help from another person to put on and taking off regular upper body clothing?: A Little ?Help from another person to put on and taking off regular lower body clothing?: A Little ?6 Click Score: 18 ? ?  ?End of Session Equipment Utilized During Treatment: Gait belt ? ?OT Visit Diagnosis: Other symptoms and signs involving cognitive function;Muscle weakness (generalized) (M62.81);Pain;Unsteadiness on feet (R26.81) ?  ?Activity Tolerance   ?  ?Patient Left in bed;with call bell/phone within reach;with bed alarm set ?  ?Nurse Communication   ?  ? ?   ? ?Time: 8101-7510 ?OT Time Calculation (min): 31 min ? ?Charges: OT General Charges ?$OT Visit: 1 Visit ?OT Treatments ?$Self Care/Home Management : 23-37 mins ? ?Joeseph Amor OTR/L  ?Acute Rehab Services  ?[phone] office number ?[phone] pager number ? ? ?Joeseph Amor ?01/11/2022, 11:08 AM ?

## 2022-01-11 NOTE — TOC Progression Note (Addendum)
Transition of Care (TOC) - Initial/Assessment Note  ? ? ?Patient Details  ?Name: [name] ?MRN: [id number] ?Date of Birth: Feb 12, 1961 ? ?Transition of Care (TOC) CM/SW Contact:    ?Paulene Floor Leita Lindbloom, LCSWA ?Phone Number: ?01/11/2022, 10:26 AM ? ?Clinical Narrative:                 ?CSW notified that insurance authorization is still pending for SNF placement. ? ? 11: 82-  Insurance auth Approved 4/18 - 4/20, next review 4/20.  Reference ID: [id number].  Plan Auth ID: [id number]. ? ?-Facility and HCPOA have been notified of the above information, The facility reports that the patient cannot be be admitted until [facility] signs paperwork at the facility.   ? ?CSW waiting for confirmation from facility that paperwork has been completed by POA. ? ?1350-  Level 2 PASRR (in person eval) needed for patient to discharge . ? ?Pending: Pasrr ?  ? ? ?Patient Goals and CMS Choice ?  ?  ?  ? ?Expected Discharge Plan and Services ?  ?  ?  ?  ?  ?                ?  ?  ?  ?  ?  ?  ?  ?  ?  ?  ? ?Prior Living Arrangements/Services ?  ?  ?  ?       ?  ?  ?  ?  ? ?Activities of Daily Living ?Home Assistive Devices/Equipment: None ?ADL Screening (condition at time of admission) ?Patient's cognitive ability adequate to safely complete daily activities?: No ?Is the patient deaf or have difficulty hearing?: No ?Does the patient have difficulty seeing, even when wearing glasses/contacts?: No ?Does the patient have difficulty concentrating, remembering, or making decisions?: Yes ?Patient able to express need for assistance with ADLs?: Yes ?Does the patient have difficulty dressing or bathing?: Yes ?Independently performs ADLs?: No ?Communication: Independent ?Dressing (OT): Needs assistance ?Is this a change from baseline?: Change from baseline, expected to last <3days ?Grooming: Needs assistance ?Is this a change from baseline?: Change from baseline, expected to last <3 days ?Feeding: Needs assistance ?Is this a change from baseline?: Change from  baseline, expected to last >3 days ?Bathing: Needs assistance ?Is this a change from baseline?: Change from baseline, expected to last <3 days ?Toileting: Needs assistance ?Is this a change from baseline?: Change from baseline, expected to last <3 days ?In/Out Bed: Needs assistance ?Is this a change from baseline?: Change from baseline, expected to last <3 days ?Walks in Home: Needs assistance ?Is this a change from baseline?: Change from baseline, expected to last <3 days ?Does the patient have difficulty walking or climbing stairs?: Yes ?Weakness of Legs: Both ?Weakness of Arms/Hands: Both ? ?Permission Sought/Granted ?  ?  ?   ?   ?   ?   ? ?Emotional Assessment ?  ?  ?  ?  ?  ?  ? ?Admission diagnosis:  Disorientation [R41.0] ?AMS (altered mental status) [R41.82] ?Patient Active Problem List  ? Diagnosis Date Noted  ? DTs (delirium tremens) ([place]) 01/05/2022  ? AMS (altered mental status) 01/03/2022  ? Severe episode of recurrent major depressive disorder, without psychotic features ([place]) 07/31/2021  ? Pulmonary embolism ([place]) 07/30/2021  ? Alcohol abuse with intoxication ([place]) 07/30/2021  ? Suicidal ideation 07/30/2021  ? Long toenail 01/11/2021  ? Aortic atherosclerosis ([place])   ? Generalized anxiety disorder 11/18/2020  ? Moderate episode of recurrent major depressive disorder (HCC)  11/18/2020  ? Panic disorder 11/18/2020  ? Schizophrenia ([place]) 05/18/2020  ? Thiamine deficiency 12/10/2019  ? Screening-pulmonary TB 12/04/2019  ? Irritable bowel syndrome with diarrhea 07/01/2019  ? Type II diabetes mellitus with manifestations ([place]) 06/26/2019  ? De Quervain's tenosynovitis, left 04/11/2018  ? 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 Insomnia w/ sleep apnea 11/30/2016  ? Syphili, latent 03/05/2016  ? Glaucoma suspect of both eyes 11/19/2015  ? Non-small cell carcinoma of lung, stage 1 ([place]) 10/12/2015  ? COPD GOLD II if use fev1/VC and still smoking  08/11/2015  ? High risk homosexual behavior 07/09/2014  ? PUD  (peptic ulcer disease) 01/09/2013  ? Routine general medical examination at a health care facility 06/04/2012  ? Paranoid schizophrenia ([place]) 08/01/2011  ? DJD (degenerative joint disease) of knee 03/15/2011  ? Obstructive sleep apnea 11/29/2010  ? 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 Allergic rhinitis 03/17/2010  ? Smoking 12/14/2009  ? HYPERTENSION, BENIGN 10/05/2009  ? Nonischemic cardiomyopathy ([place]) 10/05/2009  ? PAD (peripheral artery disease) ([place]) 09/15/2009  ? Hx of adenomatous colonic polyps 12/03/2008  ? Hyperlipidemia with target LDL less than 130 06/23/2008  ? BPH associated with nocturia 06/23/2008  ? ?PCP:  [name], MD ?Pharmacy:   ?[facility] [place] ?[address] ?[place] [address] ?Phone: [phone] Fax: [phone] ? ? ? ? ?Social Determinants of Health (SDOH) Interventions ?  ? ?Readmission Risk Interventions ?   ? View : No data to display.  ?  ?  ?  ? ? ? ?

## 2022-01-12 DIAGNOSIS — F10931 Alcohol use, unspecified with withdrawal delirium: Secondary | ICD-10-CM | POA: Diagnosis not present

## 2022-01-12 NOTE — TOC Progression Note (Signed)
Transition of Care (TOC) - Initial/Assessment Note  ? ? ?Patient Details  ?Name: [name] ?MRN: [id number] ?Date of Birth: 1961-09-10 ? ?Transition of Care (TOC) CM/SW Contact:    ?Paulene Floor Murlene Revell, LCSWA ?Phone Number: ?01/12/2022, 9:05 AM ? ?Clinical Narrative:                 ?CSW contacted the state PASRR system to inquire about the time frame for the patient receiving a PASRR.  CSW was informed that someone would be out to interview the patient within the "next couple of days".   ? ?Pending- state PASRR number ? ?  ?  ? ? ?Patient Goals and CMS Choice ?  ?  ?  ? ?Expected Discharge Plan and Services ?  ?  ?  ?  ?  ?Expected Discharge Date: 01/11/22               ?  ?  ?  ?  ?  ?  ?  ?  ?  ?  ? ?Prior Living Arrangements/Services ?  ?  ?  ?       ?  ?  ?  ?  ? ?Activities of Daily Living ?Home Assistive Devices/Equipment: None ?ADL Screening (condition at time of admission) ?Patient's cognitive ability adequate to safely complete daily activities?: No ?Is the patient deaf or have difficulty hearing?: No ?Does the patient have difficulty seeing, even when wearing glasses/contacts?: No ?Does the patient have difficulty concentrating, remembering, or making decisions?: Yes ?Patient able to express need for assistance with ADLs?: Yes ?Does the patient have difficulty dressing or bathing?: Yes ?Independently performs ADLs?: No ?Communication: Independent ?Dressing (OT): Needs assistance ?Is this a change from baseline?: Change from baseline, expected to last <3days ?Grooming: Needs assistance ?Is this a change from baseline?: Change from baseline, expected to last <3 days ?Feeding: Needs assistance ?Is this a change from baseline?: Change from baseline, expected to last >3 days ?Bathing: Needs assistance ?Is this a change from baseline?: Change from baseline, expected to last <3 days ?Toileting: Needs assistance ?Is this a change from baseline?: Change from baseline, expected to last <3 days ?In/Out Bed: Needs  assistance ?Is this a change from baseline?: Change from baseline, expected to last <3 days ?Walks in Home: Needs assistance ?Is this a change from baseline?: Change from baseline, expected to last <3 days ?Does the patient have difficulty walking or climbing stairs?: Yes ?Weakness of Legs: Both ?Weakness of Arms/Hands: Both ? ?Permission Sought/Granted ?  ?  ?   ?   ?   ?   ? ?Emotional Assessment ?  ?  ?  ?  ?  ?  ? ?Admission diagnosis:  Disorientation [R41.0] ?AMS (altered mental status) [R41.82] ?Patient Active Problem List  ? Diagnosis Date Noted  ? DTs (delirium tremens) ([place]) 01/05/2022  ? AMS (altered mental status) 01/03/2022  ? Severe episode of recurrent major depressive disorder, without psychotic features ([place]) 07/31/2021  ? Pulmonary embolism ([place]) 07/30/2021  ? Alcohol abuse with intoxication ([place]) 07/30/2021  ? Suicidal ideation 07/30/2021  ? Long toenail 01/11/2021  ? Aortic atherosclerosis ([place])   ? Generalized anxiety disorder 11/18/2020  ? Moderate episode of recurrent major depressive disorder ([place]) 11/18/2020  ? Panic disorder 11/18/2020  ? Schizophrenia ([place]) 05/18/2020  ? Thiamine deficiency 12/10/2019  ? Screening-pulmonary TB 12/04/2019  ? Irritable bowel syndrome with diarrhea 07/01/2019  ? Type II diabetes mellitus with manifestations ([place]) 06/26/2019  ? De Quervain's tenosynovitis, left 04/11/2018  ?  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 Insomnia w/ sleep apnea 11/30/2016  ? Syphili, latent 03/05/2016  ? Glaucoma suspect of both eyes 11/19/2015  ? Non-small cell carcinoma of lung, stage 1 ([place]) 10/12/2015  ? COPD GOLD II if use fev1/VC and still smoking  08/11/2015  ? High risk homosexual behavior 07/09/2014  ? PUD (peptic ulcer disease) 01/09/2013  ? Routine general medical examination at a health care facility 06/04/2012  ? Paranoid schizophrenia ([place]) 08/01/2011  ? DJD (degenerative joint disease) of knee 03/15/2011  ? Obstructive sleep apnea 11/29/2010  ? ERECTILE DYSFUNCTION,  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/2010  ? Allergic rhinitis 03/17/2010  ? Smoking 12/14/2009  ? HYPERTENSION, BENIGN 10/05/2009  ? Nonischemic cardiomyopathy ([place]) 10/05/2009  ? PAD (peripheral artery disease) ([place]) 09/15/2009  ? Hx of adenomatous colonic polyps 12/03/2008  ? Hyperlipidemia with target LDL less than 130 06/23/2008  ? BPH associated with nocturia 06/23/2008  ? ?PCP:  [name], MD ?Pharmacy:   ?[place] [place] ?[address] ?[place] [address] ?Phone: [phone] Fax: [phone] ? ? ? ? ?Social Determinants of Health (SDOH) Interventions ?  ? ?Readmission Risk Interventions ?   ? View : No data to display.  ?  ?  ?  ? ? ? ?

## 2022-01-12 NOTE — Progress Notes (Signed)
?PROGRESS NOTE ? ? ? ?[name]  GUR:[id number] DOB: 12-31-1960 DOA: 01/03/2022 ?PCP: [name], MD  ? ?Brief Narrative: 61 y.o. male with medical history significant for ongoing alcohol abuse, hypertension, COPD, paranoid schizophrenia, depression/anxiety, nonischemic cardiomyopathy, tobacco abuse, CAD, CHF, noncompliance with medications, recent small incidental PE in Nov 2022, who presented to the hospital after neighbors called EMS as they were concerned that patient was getting more confused and was having repeated falls at home. ? ?Assessment & Plan: ?  ?Principal Problem: ?  DTs (delirium tremens) ([place]) ?Active Problems: ?  Hyperlipidemia with target LDL less than 130 ?  HYPERTENSION, BENIGN ?  Nonischemic cardiomyopathy ([place]) ?  PAD (peripheral artery disease) ([place]) ?  Paranoid schizophrenia ([name]) ?  Schizophrenia ([name]) ?  Alcohol abuse with intoxication ([name]) ?  AMS (altered mental status) ? ? ?#1 alcohol abuse with DTs resolved status post high-dose thiamine for 5 days on Seroquel and Haldol for symptom control.  PT OT recommends SNF.  Patient has unsafe living conditions therefore needs to be discharged to SNF. ? ?#2 history of depression/paranoid schizophrenia on Zoloft BuSpar Haldol and Seroquel. ? ?#3 history of nonischemic cardiomyopathy with chronic systolic heart failure with EF 40%.  On Coreg and ARB ? ?#4 stage Ia non-small cell right upper lobe lung cancer diagnosed in 2016 and status post wedge resection of right upper lobe nodule and lobectomy. ? ?#5 HIV prevention with prophylactic Descovy due to high risk sexual behavior. ? ?#6 hypertension continue ARB and Coreg. ? ?#7 incidental small PE no anticoagulation ? ?#8 mild transaminitis holding statin ? ?#9 urinary retention continue Flomax Foley DC'd on 01/08/2022 needs outpatient neurology follow-up ? ?#10 hepatic cyst noted on the right upper quadrant ultrasound needs outpatient MRI or CT and GI follow-up. ? ? ? ?Estimated body mass  index is 23.11 kg/m? as calculated from the following: ?  Height as of this encounter: 6\' 2"  (1.88 m). ?  Weight as of this encounter: 81.6 kg. ? ?DVT prophylaxis: Heparin  ?code Status: Full code ?Family Communication: None ?Disposition Plan:  Status is: Inpatient ?Remains inpatient appropriate because: Unsafe DC plan ?  ?Consultants:  ?None ? ?Procedures: None ?Antimicrobials: None ? ?Subjective: ?Patient sitting up ?Follow some commands ? ?Objective: ?Vitals:  ? 01/11/22 0500 01/11/22 0735 01/11/22 1719 01/11/22 2102  ?BP: 102/66 104/75 119/74 96/61  ?Pulse: 80 93 79 86  ?Resp: 18 18 16 18   ?Temp: 98 ?F (36.7 ?C) 98.4 ?F (36.9 ?C) 97.6 ?F (36.4 ?C) 98.5 ?F (36.9 ?C)  ?TempSrc:  Oral Oral   ?SpO2: 100% 100% 100% 100%  ?Weight:      ?Height:      ? ? ?Intake/Output Summary (Last 24 hours) at 01/12/2022 1702 ?Last data filed at 01/12/2022 0751 ?Gross per 24 hour  ?Intake 1080 ml  ?Output 0 ml  ?Net 1080 ml  ? ?Filed Weights  ? 01/04/22 0828  ?Weight: 81.6 kg  ? ? ?Examination: ? ?General exam: Appears in some mild distress  ?respiratory system: Clear to auscultation. Respiratory effort normal. ?Cardiovascular system: S1 & S2 heard, RRR. No JVD, murmurs, rubs, gallops or clicks. No pedal edema. ?Gastrointestinal system: Abdomen is nondistended, soft and nontender. No organomegaly or masses felt. Normal bowel sounds heard. ?Central nervous system awake moves all extremities  ?extremities: No edema ?Skin: No rashes, lesions or ulcers ?Psychiatry: Unable to assess ? ? ?Data Reviewed: I have personally reviewed following labs and imaging studies ? ?CBC: ?Recent Labs  ?Lab 01/06/22 ?  7290 01/07/22 ?0330 01/08/22 ?0301 01/09/22 ?2111  ?WBC 5.7 7.3 7.0 6.4  ?NEUTROABS 2.8 3.9 4.1 3.2  ?HGB 16.7 16.4 14.1 13.8  ?HCT 46.3 47.0 41.2 41.4  ?MCV 90.1 91.4 92.4 93.9  ?PLT 96* 125* 105* 114*  ? ?Basic Metabolic Panel: ?Recent Labs  ?Lab 01/06/22 ?5520 01/07/22 ?0330 01/08/22 ?0301 01/09/22 ?8022  ?NA 139 139 142 144  ?K 3.5 4.7 3.6  3.8  ?CL 107 107 113* 114*  ?CO2 24 26 24 25   ?GLUCOSE 104* 107* 102* 100*  ?BUN 19 16 12 10   ?CREATININE 0.87 1.11 0.87 0.87  ?CALCIUM 9.2 9.6 8.9 9.0  ?MG 2.0 2.3 2.2 2.1  ? ?GFR: ?Estimated Creatinine Clearance: 102.9 mL/min (by C-G formula based on SCr of 0.87 mg/dL). ?Liver Function Tests: ?Recent Labs  ?Lab 01/06/22 ?3361 01/07/22 ?0330 01/08/22 ?0301 01/09/22 ?2244  ?AST 64* 56* 33 25  ?ALT 160* 147* 99* 75*  ?ALKPHOS 50 62 49 48  ?BILITOT 1.6* 1.3* 1.0 0.7  ?PROT 6.5 6.9 6.0* 5.9*  ?ALBUMIN 3.5 3.7 3.1* 3.0*  ? ?No results for input(s): LIPASE, AMYLASE in the last 168 hours. ?No results for input(s): AMMONIA in the last 168 hours. ?Coagulation Profile: ?No results for input(s): INR, PROTIME in the last 168 hours. ?Cardiac Enzymes: ?No results for input(s): CKTOTAL, CKMB, CKMBINDEX, TROPONINI in the last 168 hours. ?BNP (last 3 results) ?No results for input(s): PROBNP in the last 8760 hours. ?HbA1C: ?No results for input(s): HGBA1C in the last 72 hours. ?CBG: ?No results for input(s): GLUCAP in the last 168 hours. ?Lipid Profile: ?No results for input(s): CHOL, HDL, LDLCALC, TRIG, CHOLHDL, LDLDIRECT in the last 72 hours. ?Thyroid Function Tests: ?No results for input(s): TSH, T4TOTAL, FREET4, T3FREE, THYROIDAB in the last 72 hours. ?Anemia Panel: ?No results for input(s): VITAMINB12, FOLATE, FERRITIN, TIBC, IRON, RETICCTPCT in the last 72 hours. ?Sepsis Labs: ?No results for input(s): PROCALCITON, LATICACIDVEN in the last 168 hours. ? ?No results found for this or any previous visit (from the past 240 hour(s)).  ? ? ? ? ? ?Radiology Studies: ?No results found. ? ? ? ? ? ?Scheduled Meds: ? aspirin  81 mg Oral Daily  ? busPIRone  30 mg Oral BID  ? carvedilol  3.125 mg Oral BID WC  ? heparin  5,000 Units Subcutaneous Q8H  ? nicotine  21 mg Transdermal Daily  ? pantoprazole  40 mg Oral Daily  ? polyethylene glycol  17 g Oral Daily  ? QUEtiapine  100 mg Oral QPM  ? QUEtiapine  100 mg Oral q morning  ? sertraline   200 mg Oral Daily  ? sodium chloride  1 spray Each Nare BID  ? tamsulosin  0.4 mg Oral Daily  ? thiamine  100 mg Oral Daily  ? umeclidinium-vilanterol  1 puff Inhalation QAC breakfast  ? ?Continuous Infusions: ? ? LOS: 9 days  ? ? ?Time spent: 38 min ? ?Georgette Shell, MD ? ?01/12/2022, 5:02 PM  ? ?

## 2022-01-13 ENCOUNTER — Other Ambulatory Visit: Payer: Self-pay

## 2022-01-13 ENCOUNTER — Emergency Department (HOSPITAL_COMMUNITY): Payer: Medicare Other

## 2022-01-13 ENCOUNTER — Emergency Department (HOSPITAL_COMMUNITY)
Admission: EM | Admit: 2022-01-13 | Discharge: 2022-01-17 | Discharge status: Against Medical Advice | Payer: Medicare Other | Attending: Emergency Medicine | Admitting: Emergency Medicine

## 2022-01-13 DIAGNOSIS — R4182 Altered mental status, unspecified: Secondary | ICD-10-CM | POA: Insufficient documentation

## 2022-01-13 DIAGNOSIS — R112 Nausea with vomiting, unspecified: Secondary | ICD-10-CM | POA: Diagnosis not present

## 2022-01-13 DIAGNOSIS — D72829 Elevated white blood cell count, unspecified: Secondary | ICD-10-CM | POA: Insufficient documentation

## 2022-01-13 DIAGNOSIS — Z79899 Other long term (current) drug therapy: Secondary | ICD-10-CM | POA: Insufficient documentation

## 2022-01-13 DIAGNOSIS — R2681 Unsteadiness on feet: Secondary | ICD-10-CM | POA: Insufficient documentation

## 2022-01-13 DIAGNOSIS — F10931 Alcohol use, unspecified with withdrawal delirium: Secondary | ICD-10-CM | POA: Diagnosis not present

## 2022-01-13 DIAGNOSIS — Z7982 Long term (current) use of aspirin: Secondary | ICD-10-CM | POA: Insufficient documentation

## 2022-01-13 DIAGNOSIS — G9341 Metabolic encephalopathy: Secondary | ICD-10-CM | POA: Diagnosis not present

## 2022-01-13 DIAGNOSIS — I10 Essential (primary) hypertension: Secondary | ICD-10-CM | POA: Insufficient documentation

## 2022-01-13 DIAGNOSIS — R410 Disorientation, unspecified: Secondary | ICD-10-CM | POA: Insufficient documentation

## 2022-01-13 LAB — CBC WITH DIFFERENTIAL/PLATELET
Abs Immature Granulocytes: 0.08 10*3/uL — ABNORMAL HIGH (ref 0.00–0.07)
Basophils Absolute: 0 10*3/uL (ref 0.0–0.1)
Basophils Relative: 0 %
Eosinophils Absolute: 0.1 10*3/uL (ref 0.0–0.5)
Eosinophils Relative: 2 %
HCT: 43.5 % (ref 39.0–52.0)
Hemoglobin: 14.2 g/dL (ref 13.0–17.0)
Immature Granulocytes: 1 %
Lymphocytes Relative: 20 %
Lymphs Abs: 1.7 10*3/uL (ref 0.7–4.0)
MCH: 31.3 pg (ref 26.0–34.0)
MCHC: 32.6 g/dL (ref 30.0–36.0)
MCV: 95.8 fL (ref 80.0–100.0)
Monocytes Absolute: 0.9 10*3/uL (ref 0.1–1.0)
Monocytes Relative: 11 %
Neutro Abs: 5.8 10*3/uL (ref 1.7–7.7)
Neutrophils Relative %: 66 %
Platelets: 169 10*3/uL (ref 150–400)
RBC: 4.54 MIL/uL (ref 4.22–5.81)
RDW: 15.2 % (ref 11.5–15.5)
WBC: 8.6 10*3/uL (ref 4.0–10.5)
nRBC: 0 % (ref 0.0–0.2)

## 2022-01-13 LAB — COMPREHENSIVE METABOLIC PANEL
ALT: 68 U/L — ABNORMAL HIGH (ref 0–44)
AST: 55 U/L — ABNORMAL HIGH (ref 15–41)
Albumin: 3.6 g/dL (ref 3.5–5.0)
Alkaline Phosphatase: 60 U/L (ref 38–126)
Anion gap: 8 (ref 5–15)
BUN: 10 mg/dL (ref 8–23)
CO2: 22 mmol/L (ref 22–32)
Calcium: 9.5 mg/dL (ref 8.9–10.3)
Chloride: 111 mmol/L (ref 98–111)
Creatinine, Ser: 0.86 mg/dL (ref 0.61–1.24)
GFR, Estimated: >60 mL/min (ref >60)
Glucose, Bld: 135 mg/dL — ABNORMAL HIGH (ref 70–99)
Potassium: 3.6 mmol/L (ref 3.5–5.1)
Sodium: 141 mmol/L (ref 135–145)
Total Bilirubin: 0.4 mg/dL (ref 0.3–1.2)
Total Protein: 6.9 g/dL (ref 6.5–8.1)

## 2022-01-13 LAB — LIPASE, BLOOD: Lipase: 44 U/L (ref 11–51)

## 2022-01-13 MED ORDER — SODIUM CHLORIDE 0.9 % IV BOLUS
1000.0000 mL | Freq: Once | INTRAVENOUS | Status: AC
  Administered 2022-01-13: 1000 mL via INTRAVENOUS

## 2022-01-13 MED ORDER — OLANZAPINE 5 MG PO TBDP
10.0000 mg | ORAL_TABLET | Freq: Once | ORAL | Status: AC
  Administered 2022-01-13: 10 mg via ORAL
  Filled 2022-01-13: qty 2

## 2022-01-13 MED ORDER — THIAMINE HCL 100 MG PO TABS
100.0000 mg | ORAL_TABLET | Freq: Every day | ORAL | Status: DC
  Administered 2022-01-13 – 2022-01-16 (×4): 100 mg via ORAL
  Filled 2022-01-13 (×4): qty 1

## 2022-01-13 MED ORDER — ONDANSETRON 4 MG PO TBDP
4.0000 mg | ORAL_TABLET | Freq: Once | ORAL | Status: AC
  Administered 2022-01-13: 4 mg via ORAL
  Filled 2022-01-13: qty 1

## 2022-01-13 MED ORDER — ONDANSETRON HCL 4 MG/2ML IJ SOLN
4.0000 mg | Freq: Once | INTRAMUSCULAR | Status: AC
  Administered 2022-01-13: 4 mg via INTRAVENOUS
  Filled 2022-01-13: qty 2

## 2022-01-13 NOTE — Social Work (Signed)
CSW got a consult about the Pt being brought back to the hospital from a SNF due to behavior issues. CSW called the SNF and spoke to the Director Cori Razor about the Pt. The director reported that the Pt was going into females rooms as well creating safety hazards. The facility does not want to take the patient back due to Pt. Unsafe behaviors.  ?

## 2022-01-13 NOTE — Progress Notes (Signed)
Nursing report given to nurse Maudie Mercury at [facility]  ?

## 2022-01-13 NOTE — Progress Notes (Signed)
DISCHARGE NOTE SNF ?[name] to be discharged Skilled nursing facility [facility]  per MD order. Patient verbalized understanding. ? ?Skin clean, dry and intact without evidence of skin break down, no evidence of skin tears noted. IV catheter discontinued intact. Site without signs and symptoms of complications. Dressing and pressure applied. Pt denies pain at the site currently. No complaints noted. ? ?Patient free of lines, drains, and wounds.  ? ?Discharge packet assembled. An After Visit Summary (AVS) was printed and given to the EMS personnel. Patient escorted via stretcher and discharged to [facility] via ambulance. Report called to accepting facility; all questions and concerns addressed.  ? ?Gatha Mcnulty S Brentley Horrell, RN ?_______________________________________________________________________  ?

## 2022-01-13 NOTE — ED Notes (Signed)
Called staffing regarding Air cabin crew. Per staffing there are no sitters available at this time. ?
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
## 2022-01-13 NOTE — TOC Transition Note (Signed)
Transition of Care (TOC) - CM/SW Discharge Note ? ? ?Patient Details  ?Name: [name] ?MRN: [id number] ?Date of Birth: 05/08/1961 ? ?Transition of Care (TOC) CM/SW Contact:  ?Paulene Floor Kanda Deluna, LCSWA ?Phone Number: ?01/13/2022, 10:54 AM ? ? ?Clinical Narrative:    ?Patient will DC to: [facility] ?Anticipated DC date: 01/13/2022 ?Family notified: Yes ?Transport by: Corey Harold ? ? ?Per MD patient ready for DC to SNF. RN to call report prior to discharge [phone] room 201. RN, patient, patient's family, and facility notified of DC. Discharge Summary and FL2 sent to facility. DC packet on chart. Ambulance transport requested for patient.  ? ?CSW will sign off for now as social work intervention is no longer needed. Please consult [place] again if new needs arise. ?  ? ? ?  ?  ? ? ?Patient Goals and CMS Choice ?  ?  ?  ? ?Discharge Placement ?  ?           ?Patient chooses bed at:  ([facility]) ?Patient to be transferred to facility by: [facility] ?Name of family member notified: [name] (Other)   [phone] ?Patient and family notified of of transfer: 01/13/22 ? ?Discharge Plan and Services ?  ?  ?           ?  ?  ?  ?  ?  ?  ?  ?  ?  ?  ? ?Social Determinants of Health (SDOH) Interventions ?  ? ? ?Readmission Risk Interventions ?   ? View : No data to display.  ?  ?  ?  ? ? ? ? ? ?

## 2022-01-13 NOTE — Discharge Summary (Signed)
?                                                                                ? ?[name] PJA:[id number] DOB: [birth year] DOA: 01/03/2022 ? ?PCP: [name], MD ? ?Admit date: 01/03/2022  Discharge date: 01/13/2022 ? ?Admitted From: Home   Disposition:  SNF ? ? ?Recommendations for Outpatient Follow-up:  ? ?Follow up with PCP in 1-2 weeks ? ?PCP Please obtain BMP/CBC, 2 view CXR in 1week,  (see Discharge instructions)  ? ?PCP Please follow up on the following pending results: he needs outpatient GI and psych follow-up. ? ? ?Home Health: None   ?Equipment/Devices: None  ?Consultations: None  ?Discharge Condition: Stable    ?CODE STATUS: Full    ?Diet Recommendation: Heart Healthy ?  ? ?Chief Complaint  ?Patient presents with  ? Altered Mental Status  ? Weakness  ?  ? ?Brief history of present illness from the day of admission and additional interim summary   ? ? 61 y.o. male with medical history significant for ongoing alcohol abuse, hypertension, COPD, paranoid schizophrenia, depression/anxiety, nonischemic cardiomyopathy, tobacco abuse, CAD, CHF, noncompliance with medications, recent small incidental PE in Nov 2022, who presented to the hospital after neighbors called EMS as they were concerned that patient was getting more confused and was having repeated falls at home. ? ?                                                               Hospital Course  ? ?History of alcohol abuse, currently in DTs with multiple falls at home.  Head CT unremarkable, no focal deficits, was placed on Librium along with CIWA protocol + high-dose IV thiamine for 5 days, counseled to quit alcohol. Resolved DTs, continue supportive care low-dose Seroquel and Haldol added as well as he also has underlying paranoid schizophrenia with frequent hallucinations.  Remains quite deconditioned, continue PT- OT >> SNF. ?  ?2.  Unsafe living  conditions.  Social work input ?  ?3.  History of paranoid schizophrenia, depression - currently no suicidal ideation.  Continue Zoloft , Buspar + added Seroquel for episodes of hallucinations and agitation along with as needed Haldol.  Encephalopathy much improved, now at baseline mental status and calm, continue to monitor. ?  ?4.  History of nonischemic cardiomyopathy chronic systolic heart failure EF around 40%.  Currently appears compensated.  Continue Coreg for blood pressure too low for ACE/ARB or Entresto. ?  ?5.  Dyslipidemia.  Holding statin due to #13 below. ?  ?6. COPD.  Stable no acute issues.  Supportive care. ?  ?7.  Past history of COVID-19 infection.  Supportive care. ?  ?8. Stage IA (T1a, N0, M0) non-small cell right upper lobe lung cancer, adenocarcinoma diagnosed in November 2016 ?S/P wedge resection right upper lobe nodule, and thoracoscopic right upper lobectomy under the care of Dr. Roxan Hockey on 09/04/2015.  PCP to monitor. ?  ?9.  HIV prevention with Prophylactic  Descovy due to high risk sexual behavior.  Continue home medications outside the hospital, will get refills from his PCP or primary ID physician, stable CD4 count, undetectable HIV viral load. ?  ?10.  HTN.  On beta-blocker, blood pressure stable no room to add other medications like ACE/ARB or Entresto. ?  ?11.  PAD.  Continue beta-blocker, statin and start aspirin for secondary prevention. ?  ?12.  Incidental small PE in November 2022.  No further anticoagulation due to ongoing alcohol abuse and multiple falls.  Risks outweigh benefit.  As it is he said he was not taking Eliquis for months.    ?  ?13.  Mild asymptomatic transaminitis.  Hold statin, trend is improving and nonacute right mid quadrant ultrasound. -ve Ac.Hep.panel. ?  ?14.  2 hepatic cyst noted on right upper quadrant ultrasound.  Outpatient liver MRI and GI follow-up to be arranged by PCP. ?  ?15. Ur. Retention - Foley + Flomax 01/05/22, DC Foley 01/08/2022 -  stable  Bladder scans, outpt Urology follow up. ? ? ?Discharge diagnosis   ? ? ?Principal Problem: ?  DTs (delirium tremens) ([place]) ?Active Problems: ?  Hyperlipidemia with target LDL less than 130 ?  HYPERTENSION, BENIGN ?  Nonischemic cardiomyopathy ([place]) ?  PAD (peripheral artery disease) ([place]) ?  Paranoid schizophrenia ([place]) ?  Schizophrenia ([place]) ?  Alcohol abuse with intoxication ([place]) ?  AMS (altered mental status) ? ? ? ?Discharge instructions   ? ?Discharge Instructions   ? ? Discharge instructions   Complete by: As directed ?  ? Follow with Primary MD [name], MD in 7 days  ? ?Get CBC, CMP, 2 view Chest X ray -  checked next visit within 1 week by SNF MD  ? ?Activity: As tolerated with Full fall precautions use walker/cane & assistance as needed ? ?Disposition SNF ? ?Diet: Heart Healthy  with feeding assistance and aspiration precautions. ?  ? ?Special Instructions: If you have smoked or chewed Tobacco  in the last 2 yrs please stop smoking, stop any regular Alcohol  and or any Recreational drug use. ? ?On your next visit with your primary care physician please Get Medicines reviewed and adjusted. ? ?Please request your Prim.MD to go over all Hospital Tests and Procedure/Radiological results at the follow up, please get all Hospital records sent to your Prim MD by signing hospital release before you go home. ? ?If you experience worsening of your admission symptoms, develop shortness of breath, life threatening emergency, suicidal or homicidal thoughts you must seek medical attention immediately by calling 911 or calling your MD immediately  if symptoms less severe. ? ?You Must read complete instructions/literature along with all the possible adverse reactions/side effects for all the Medicines you take and that have been prescribed to you. Take any new Medicines after you have completely understood and accpet all the possible adverse reactions/side effects.  ? Increase activity slowly   Complete by: As  directed ?  ? No wound care   Complete by: As directed ?  ? ?  ? ? ?Discharge Medications  ? ?Allergies as of 01/13/2022   ? ?   Reactions  ? Ace Inhibitors Cough  ? Aspirin Other (See Comments)  ? Reaction:  Nose bleeds and GI bleeding   ? Clopidogrel Bisulfate Other (See Comments)  ? Reaction:  Nose bleeds   ? Crestor [rosuvastatin Calcium] Other (See Comments)  ? Reaction:  Leg cramps   ? Metformin And Related Diarrhea  ?  Rosuvastatin Cough  ? ?  ? ?  ?Medication List  ?  ? ?STOP taking these medications   ? ?apixaban 5 MG Tabs tablet ?Commonly known as: ELIQUIS ?  ?ARIPiprazole 5 MG tablet ?Commonly known as: ABILIFY ?  ?cetirizine 10 MG tablet ?Commonly known as: ZYRTEC ?  ?Eliquis 5 MG Tabs tablet ?Generic drug: apixaban ?  ?Entresto 24-26 MG ?Generic drug: sacubitril-valsartan ?  ?fluticasone 50 MCG/ACT nasal spray ?Commonly known as: FLONASE ?  ?hydrOXYzine 50 MG tablet ?Commonly known as: ATARAX ?  ?traZODone 100 MG tablet ?Commonly known as: DESYREL ?  ? ?  ? ?TAKE these medications   ? ?acetaminophen 500 MG tablet ?Commonly known as: TYLENOL ?Take 1,000 mg by mouth 2 (two) times daily as needed for moderate pain. ?  ?albuterol (2.5 MG/3ML) 0.083% nebulizer solution ?Commonly known as: PROVENTIL ?Take 3 mLs (2.5 mg total) by nebulization every 6 (six) hours as needed for wheezing or shortness of breath. ?  ?albuterol 108 (90 Base) MCG/ACT inhaler ?Commonly known as: VENTOLIN HFA ?Inhale 2 puffs into the lungs every 4 (four) hours as needed for wheezing. ?  ?Anoro Ellipta 62.5-25 MCG/ACT Aepb ?Generic drug: umeclidinium-vilanterol ?Inhale 1 puff into the lungs daily. ?  ?aspirin 81 MG chewable tablet ?Chew 1 tablet (81 mg total) by mouth daily. ?  ?busPIRone 30 MG tablet ?Commonly known as: BUSPAR ?Take 1 tablet (30 mg total) by mouth 2 (two) times daily. ?  ?carvedilol 3.125 MG tablet ?Commonly known as: COREG ?Take 1 tablet (3.125 mg total) by mouth 2 (two) times daily with a meal. ?What changed:   ?medication strength ?how much to take ?  ?Descovy 200-25 MG tablet ?Generic drug: emtricitabine-tenofovir AF ?Take 1 tablet by mouth daily. ?  ?donepezil 5 MG tablet ?Commonly known as: ARICEPT ?Take 5 mg by mouth at b

## 2022-01-13 NOTE — ED Triage Notes (Signed)
Pt BIB EMS from [facility] for AMS and N/V. Staff said that pt had previously been in the hospital for ETOH withdrawal. Upon EMS arrival pt was AxOx4 but still complaining of nausea. ? ?VSS w/ EMS.  ?

## 2022-01-13 NOTE — TOC Progression Note (Signed)
Transition of Care (TOC) - Initial/Assessment Note  ? ? ?Patient Details  ?Name: [name] ?MRN: [id number] ?Date of Birth: [birth year] ? ?Transition of Care (TOC) CM/SW Contact:    ?Paulene Floor Sunni Richardson, LCSWA ?Phone Number: ?01/13/2022, 8:15 AM ? ?Clinical Narrative:                 ?CSW contacted [place] PASRR to inquire about the status of the patient's PASRR number.  CSW was informed that the reviewer assessed the patient yesterday and a determination will be made "soon".  No exact time frame for the determination was given.  ? ?  ?  ? ? ?Patient Goals and CMS Choice ?  ?  ?  ? ?Expected Discharge Plan and Services ?  ?  ?  ?  ?  ?Expected Discharge Date: 01/11/22               ?  ?  ?  ?  ?  ?  ?  ?  ?  ?  ? ?Prior Living Arrangements/Services ?  ?  ?  ?       ?  ?  ?  ?  ? ?Activities of Daily Living ?Home Assistive Devices/Equipment: None ?ADL Screening (condition at time of admission) ?Patient's cognitive ability adequate to safely complete daily activities?: No ?Is the patient deaf or have difficulty hearing?: No ?Does the patient have difficulty seeing, even when wearing glasses/contacts?: No ?Does the patient have difficulty concentrating, remembering, or making decisions?: Yes ?Patient able to express need for assistance with ADLs?: Yes ?Does the patient have difficulty dressing or bathing?: Yes ?Independently performs ADLs?: No ?Communication: Independent ?Dressing (OT): Needs assistance ?Is this a change from baseline?: Change from baseline, expected to last <3days ?Grooming: Needs assistance ?Is this a change from baseline?: Change from baseline, expected to last <3 days ?Feeding: Needs assistance ?Is this a change from baseline?: Change from baseline, expected to last >3 days ?Bathing: Needs assistance ?Is this a change from baseline?: Change from baseline, expected to last <3 days ?Toileting: Needs assistance ?Is this a change from baseline?: Change from baseline, expected to last <3 days ?In/Out Bed: Needs  assistance ?Is this a change from baseline?: Change from baseline, expected to last <3 days ?Walks in Home: Needs assistance ?Is this a change from baseline?: Change from baseline, expected to last <3 days ?Does the patient have difficulty walking or climbing stairs?: Yes ?Weakness of Legs: Both ?Weakness of Arms/Hands: Both ? ?Permission Sought/Granted ?  ?  ?   ?   ?   ?   ? ?Emotional Assessment ?  ?  ?  ?  ?  ?  ? ?Admission diagnosis:  Disorientation [R41.0] ?AMS (altered mental status) [R41.82] ?Patient Active Problem List  ? Diagnosis Date Noted  ? DTs (delirium tremens) ([place]) 01/05/2022  ? AMS (altered mental status) 01/03/2022  ? Severe episode of recurrent major depressive disorder, without psychotic features ([place]) 07/31/2021  ? Pulmonary embolism ([place]) 07/30/2021  ? Alcohol abuse with intoxication ([place]) 07/30/2021  ? Suicidal ideation 07/30/2021  ? Long toenail 01/11/2021  ? Aortic atherosclerosis ([place])   ? Generalized anxiety disorder 11/18/2020  ? Moderate episode of recurrent major depressive disorder ([place]) 11/18/2020  ? Panic disorder 11/18/2020  ? Schizophrenia ([place]) 05/18/2020  ? Thiamine deficiency 12/10/2019  ? Screening-pulmonary TB 12/04/2019  ? Irritable bowel syndrome with diarrhea 07/01/2019  ? Type II diabetes mellitus with manifestations ([place]) 06/26/2019  ? De Quervain's tenosynovitis, left 04/11/2018  ?  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 Insomnia w/ sleep apnea 11/30/2016  ? Syphili, latent 03/05/2016  ? Glaucoma suspect of both eyes 11/19/2015  ? Non-small cell carcinoma of lung, stage 1 ([place]) 10/12/2015  ? COPD GOLD II if use fev1/VC and still smoking  08/11/2015  ? High risk homosexual behavior 07/09/2014  ? PUD (peptic ulcer disease) 01/09/2013  ? Routine general medical examination at a health care facility 06/04/2012  ? Paranoid schizophrenia ([place]) 08/01/2011  ? DJD (degenerative joint disease) of knee 03/15/2011  ? Obstructive sleep apnea 11/29/2010  ? ERECTILE DYSFUNCTION,  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/2010  ? Allergic rhinitis 03/17/2010  ? Smoking 12/14/2009  ? HYPERTENSION, BENIGN 10/05/2009  ? Nonischemic cardiomyopathy ([place]) 10/05/2009  ? PAD (peripheral artery disease) ([place]) 09/15/2009  ? Hx of adenomatous colonic polyps 12/03/2008  ? Hyperlipidemia with target LDL less than 130 06/23/2008  ? BPH associated with nocturia 06/23/2008  ? ?PCP:  [name], MD ?Pharmacy:   ?[facility] [place] ?[address] ?[place] [address] ?Phone: [phone] Fax: [phone] ? ? ? ? ?Social Determinants of Health (SDOH) Interventions ?  ? ?Readmission Risk Interventions ?   ? View : No data to display.  ?  ?  ?  ? ? ? ?

## 2022-01-14 DIAGNOSIS — G9341 Metabolic encephalopathy: Secondary | ICD-10-CM

## 2022-01-14 LAB — URINALYSIS, ROUTINE W REFLEX MICROSCOPIC
Bilirubin Urine: NEGATIVE
Glucose, UA: NEGATIVE mg/dL
Hgb urine dipstick: NEGATIVE
Ketones, ur: NEGATIVE mg/dL
Leukocytes,Ua: NEGATIVE
Nitrite: NEGATIVE
Protein, ur: NEGATIVE mg/dL
Specific Gravity, Urine: 1.02 (ref 1.005–1.030)
pH: 7 (ref 5.0–8.0)

## 2022-01-14 MED ORDER — LORAZEPAM 1 MG PO TABS
1.0000 mg | ORAL_TABLET | Freq: Four times a day (QID) | ORAL | Status: DC | PRN
Start: 2022-01-14 — End: 2022-01-17
  Administered 2022-01-14 – 2022-01-16 (×3): 1 mg via ORAL
  Filled 2022-01-14 (×3): qty 1

## 2022-01-14 MED ORDER — QUETIAPINE FUMARATE ER 200 MG PO TB24: 200.0000 mg | ORAL_TABLET | Freq: Every day | ORAL | 0 refills | Status: AC

## 2022-01-14 MED ORDER — FOLIC ACID 1 MG PO TABS
1.0000 mg | ORAL_TABLET | Freq: Every day | ORAL | Status: DC
Start: 2022-01-14 — End: 2022-01-17
  Administered 2022-01-14 – 2022-01-16 (×3): 1 mg via ORAL
  Filled 2022-01-14 (×3): qty 1

## 2022-01-14 MED ORDER — DONEPEZIL HCL 5 MG PO TABS
5.0000 mg | ORAL_TABLET | Freq: Every day | ORAL | Status: DC
Start: 2022-01-14 — End: 2022-01-17
  Administered 2022-01-14 – 2022-01-16 (×3): 5 mg via ORAL
  Filled 2022-01-14 (×4): qty 1

## 2022-01-14 MED ORDER — NICOTINE 14 MG/24HR TD PT24
14.0000 mg | MEDICATED_PATCH | Freq: Every day | TRANSDERMAL | Status: DC
  Administered 2022-01-14 – 2022-01-16 (×3): 14 mg via TRANSDERMAL
  Filled 2022-01-14 (×3): qty 1

## 2022-01-14 MED ORDER — QUETIAPINE FUMARATE 100 MG PO TABS
100.0000 mg | ORAL_TABLET | Freq: Two times a day (BID) | ORAL | Status: DC
  Administered 2022-01-14: 100 mg via ORAL
  Filled 2022-01-14: qty 1

## 2022-01-14 MED ORDER — ONDANSETRON 4 MG PO TBDP: 4.0000 mg | ORAL_TABLET | Freq: Four times a day (QID) | ORAL | Status: DC | PRN

## 2022-01-14 MED ORDER — QUETIAPINE FUMARATE ER 200 MG PO TB24
200.0000 mg | ORAL_TABLET | Freq: Every day | ORAL | Status: DC
  Administered 2022-01-14 – 2022-01-16 (×3): 200 mg via ORAL
  Filled 2022-01-14 (×5): qty 1

## 2022-01-14 MED ORDER — SODIUM CHLORIDE 0.9 % IV SOLN
250.0000 mg | Freq: Once | INTRAVENOUS | Status: DC
  Filled 2022-01-14: qty 2.5

## 2022-01-14 MED ORDER — ONDANSETRON 4 MG PO TBDP
4.0000 mg | ORAL_TABLET | Freq: Once | ORAL | Status: AC
  Administered 2022-01-14: 4 mg via ORAL
  Filled 2022-01-14: qty 1

## 2022-01-14 MED ORDER — LOPERAMIDE HCL 2 MG PO CAPS: 2.0000 mg | ORAL_CAPSULE | ORAL | Status: DC | PRN

## 2022-01-14 MED ORDER — ADULT MULTIVITAMIN W/MINERALS CH
1.0000 | ORAL_TABLET | Freq: Every day | ORAL | Status: DC
  Administered 2022-01-14 – 2022-01-16 (×3): 1 via ORAL
  Filled 2022-01-14 (×3): qty 1

## 2022-01-14 MED ORDER — MELATONIN 3 MG PO TABS
3.0000 mg | ORAL_TABLET | Freq: Every evening | ORAL | Status: DC
  Administered 2022-01-14 – 2022-01-16 (×3): 3 mg via ORAL
  Filled 2022-01-14 (×3): qty 1

## 2022-01-14 NOTE — Progress Notes (Signed)
CSW spoke with patient healthcare power of attorney [name] in the ED lobby. There is an advanced directive under the media tab uploaded in 2020 stating [name] is the healthcare power of attorney. [name] stated there is no family to care for patient and his sisters are in their 82's. [name] provided CSW with patients primary care providers information from [facility]. The paperwork shows that patient was prescribed medication for dementia. CSW told [name] that [name] will send out referrals to memory care placements that accept medicaid. The family does not have the money to pay out of pocket for memory care. CSW will also send referrals to long term nursing facilities that have lockdown units. CSW told Ms. Sentinel that patient may be difficult to place with his current behaviors and substance abuse/mental health history. [name] stated patient has a niece [name] who works for [employer] who also may be able to assist with placement. CSW asked [name] to have [name] call CSW. CSW told [name] that she would keep her updated.  ?

## 2022-01-14 NOTE — Progress Notes (Signed)
Referral sent to Memory Care via hub @ ?[facility] ?[facility] ?

## 2022-01-14 NOTE — ED Provider Notes (Addendum)
Talked with case management.  They would like neurology to assess to help with placement.  Neurology will evaluate the patient.  They likely will recommend some thiamine replacement given his heavy history of alcohol use.  They will leave recommendations but suspect that most of this work-up will need to be done outpatient.  They will look for some reversible causes of memory issues but they strongly suspect that there is some memory related issues likely secondary to polysubstance abuse.  PT evaluation has been placed. ?  ? ?Chest x-ray shows no evidence of infection.  No concern for TB. ? ?Neurology has evaluated patient.  They are recommending Seroquel 200 mg XR at bedtime.  Recommending thiamine.  Case management team made aware. ? ?When patient is discharged we need to make sure that Seroquel prescription is updated as well as thiamine prescription.  He is awaiting placement. ? Lennice Sites, DO ?01/14/22 1028 ? ?  ?Lennice Sites, DO ?01/14/22 1233 ? ?  ?Lennice Sites, DO ?01/14/22 1403 ? ?

## 2022-01-14 NOTE — BH Assessment (Signed)
Clinician reviewed [name]'s chart in an attempt to prepare to complete [name]'s [facility] Assessment. However, based on a note by his EDP Tyrone Nine, labs are not yet complete and, depending on the findings, may result in the need for [name] to be hospitalized. TTS to attempt MH Assessment once [name] has been medically cleared. ?

## 2022-01-14 NOTE — ED Notes (Signed)
Sitter at bedside having to frequently redirect patient to stay in his room. ?

## 2022-01-14 NOTE — NC FL2 (Signed)
?[place] MEDICAID FL2 LEVEL OF CARE SCREENING TOOL  ?  ? ?IDENTIFICATION  ?Patient Name: ?[name] Birthdate: 09-20-1961 Sex: male Admission Date (Current Location): ?01/13/2022  ?[place] and [place] Number: ? [place] ?  Facility and Address:  ?[facility], [place] [address] [place], [place] [address] ?     Provider Number: ?[id number]  ?Attending Physician Name and Address:  ?Default, Provider, MD ? Relative Name and Phone Number:  ?[name], [place], [phone], Jiles Garter, sister, [phone] ?   ?Current Level of Care: ?Hospital Recommended Level of Care: ?Memory Care, Other (Comment) (Long term Care) Prior Approval Number: ?  ? ?Date Approved/Denied: ?  PASRR Number: ?  ? ?Discharge Plan: ?Other (Comment) (Memory Care, Long-term care) ?  ? ?Current Diagnoses: ?Patient Active Problem List  ? Diagnosis Date Noted  ? DTs (delirium tremens) ([place]) 01/05/2022  ? AMS (altered mental status) 01/03/2022  ? Severe episode of recurrent major depressive disorder, without psychotic features ([place]) 07/31/2021  ? Pulmonary embolism ([place]) 07/30/2021  ? Alcohol abuse with intoxication ([place]) 07/30/2021  ? Suicidal ideation 07/30/2021  ? Long toenail 01/11/2021  ? Aortic atherosclerosis ([place])   ? Generalized anxiety disorder 11/18/2020  ? Moderate episode of recurrent major depressive disorder ([place]) 11/18/2020  ? Panic disorder 11/18/2020  ? Schizophrenia ([place]) 05/18/2020  ? Thiamine deficiency 12/10/2019  ? Screening-pulmonary TB 12/04/2019  ? Irritable bowel syndrome with diarrhea 07/01/2019  ? Type II diabetes mellitus with manifestations ([place]) 06/26/2019  ? De Quervain's tenosynovitis, left 04/11/2018  ? 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 Insomnia w/ sleep apnea 11/30/2016  ? Syphili, latent 03/05/2016  ? Glaucoma suspect of both eyes 11/19/2015  ? Non-small cell carcinoma of lung, stage 1 ([place]) 10/12/2015  ? COPD GOLD II if use fev1/VC and still smoking  08/11/2015  ? High  risk homosexual behavior 07/09/2014  ? PUD (peptic ulcer disease) 01/09/2013  ? Routine general medical examination at a health care facility 06/04/2012  ? Paranoid schizophrenia ([place]) 08/01/2011  ? DJD (degenerative joint disease) of knee 03/15/2011  ? Obstructive sleep apnea 11/29/2010  ? 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 Allergic rhinitis 03/17/2010  ? Smoking 12/14/2009  ? HYPERTENSION, BENIGN 10/05/2009  ? Nonischemic cardiomyopathy ([place]) 10/05/2009  ? PAD (peripheral artery disease) ([place]) 09/15/2009  ? Hx of adenomatous colonic polyps 12/03/2008  ? Hyperlipidemia with target LDL less than 130 06/23/2008  ? BPH associated with nocturia 06/23/2008  ? ? ?Orientation RESPIRATION BLADDER Height & Weight   ?  ?Self ? Normal Continent Weight: 180 lb (81.6 kg) ?Height:  6\' 2"  (188 cm)  ?BEHAVIORAL SYMPTOMS/MOOD NEUROLOGICAL BOWEL NUTRITION STATUS  ?[place]   Continent Diet (Regular)  ?AMBULATORY STATUS COMMUNICATION OF NEEDS Skin   ?Limited Assist Verbally Normal ?  ?  ?  ?    ?     ?     ? ? ?Personal Care Assistance Level of Assistance  ?Feeding Bathing Assistance: Maximum assistance ?Feeding assistance: Independent ?Dressing Assistance: Maximum assistance ?   ? ?Functional Limitations Info  ?  Sight Info: Adequate ?Hearing Info: Adequate ?Speech Info: Adequate  ? ? ?SPECIAL CARE FACTORS FREQUENCY  ?    ?  ?  ?  ?  ?  ?  ?   ? ? ?Contractures Contractures Info: Not present  ? ? ?Additional Factors Info  ?Code Status, Allergies Code Status Info: Full ?Allergies Info: Ace Inhibitors Aspirin Clopidogrel Bisulfate Crestor (Rosuvastatin Calcium) Metformin And Related Rosuvastatin ?  ?  ?  ?   ? ?  Current Medications (01/14/2022):  This is the current hospital active medication list ?Current Facility-Administered Medications  ?Medication Dose Route Frequency Provider Last Rate Last Admin  ? nicotine (NICODERM CQ - dosed in mg/24 hours) patch 14 mg  14 mg Transdermal Daily Curatolo, Adam, DO      ? QUEtiapine  (SEROQUEL) tablet 100 mg  100 mg Oral BID Quintella Reichert, MD   100 mg at 01/14/22 0606  ? thiamine tablet 100 mg  100 mg Oral Daily Deno Etienne, DO   100 mg at 01/14/22 0941  ? ?Current Outpatient Medications  ?Medication Sig Dispense Refill  ? acetaminophen (TYLENOL) 500 MG tablet Take 1,000 mg by mouth 2 (two) times daily as needed for moderate pain.    ? albuterol (PROVENTIL) (2.5 MG/3ML) 0.083% nebulizer solution Take 3 mLs (2.5 mg total) by nebulization every 6 (six) hours as needed for wheezing or shortness of breath. 150 mL 2  ? albuterol (VENTOLIN HFA) 108 (90 Base) MCG/ACT inhaler Inhale 2 puffs into the lungs every 4 (four) hours as needed for wheezing. 25.5 g 3  ? ANORO ELLIPTA 62.5-25 MCG/ACT AEPB Inhale 1 puff into the lungs daily. 30 each 0  ? aspirin 81 MG chewable tablet Chew 1 tablet (81 mg total) by mouth daily.    ? busPIRone (BUSPAR) 30 MG tablet Take 1 tablet (30 mg total) by mouth 2 (two) times daily. 60 tablet 1  ? carvedilol (COREG) 3.125 MG tablet Take 1 tablet (3.125 mg total) by mouth 2 (two) times daily with a meal.    ? DESCOVY 200-25 MG tablet Take 1 tablet by mouth daily. 90 tablet 0  ? donepezil (ARICEPT) 5 MG tablet Take 5 mg by mouth at bedtime.    ? folic acid (FOLVITE) 1 MG tablet Take 1 tablet (1 mg total) by mouth daily. 30 tablet 0  ? nicotine (NICODERM CQ - DOSED IN MG/24 HOURS) 21 mg/24hr patch Place 1 patch (21 mg total) onto the skin daily. 28 patch 0  ? pantoprazole (PROTONIX) 40 MG tablet Take 1 tablet (40 mg total) by mouth daily. 30 tablet 0  ? polyethylene glycol (MIRALAX / GLYCOLAX) 17 g packet Take 17 g by mouth daily as needed. (Patient taking differently: Take 17 g by mouth daily as needed for mild constipation.) 14 each 0  ? pravastatin (PRAVACHOL) 40 MG tablet Take 1 tablet (40 mg total) by mouth daily. 90 tablet 1  ? QUEtiapine (SEROQUEL) 100 MG tablet Take 1 tablet (100 mg total) by mouth 2 (two) times daily.    ? sertraline (ZOLOFT) 100 MG tablet Take 2  tablets (200 mg total) by mouth daily. 60 tablet 1  ? tamsulosin (FLOMAX) 0.4 MG CAPS capsule Take 1 capsule (0.4 mg total) by mouth daily. 30 capsule   ? thiamine 100 MG tablet Take 1 tablet (100 mg total) by mouth daily.    ? ? ? ?Discharge Medications: ?Please see discharge summary for a list of discharge medications. ? ?Relevant Imaging Results: ? ?Relevant Lab Results: ? ? ?Additional Information ?SSN:  [id number];  [place] COVID-19 Vaccine 06/02/2020 , 01/01/2020 , 12/05/2019 ? ?Raina Mina, LCSWA ? ? ? ? ?

## 2022-01-14 NOTE — Progress Notes (Signed)
Care of patient assumed at this time. Upon initial assessment patient is resting in bed mumbling incoherently to himself. Patient is alert to person but not place or time and is a very poor historian. Patient denies any pain and states "I feel much better" Patient remains resting in bed, call bell in reach.  ?

## 2022-01-14 NOTE — ED Notes (Signed)
Patient laid down and went to sleep. ?

## 2022-01-14 NOTE — Progress Notes (Signed)
CSW spoke with patients sister Jiles Garter in the ED lobby. Patients sister wanted CSW to know to keep her updated with any placements. Ms. Harrington Challenger stated she is in her 9's and is unable to care for her brother.  ?
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
## 2022-01-14 NOTE — Evaluation (Signed)
Physical Therapy Evaluation ?Patient Details ?Name: [name] ?MRN: [id number] ?DOB: 1961-02-22 ?Today's Date: 01/14/2022 ? ?History of Present Illness ? Pt is a 61 y/o male admitted from SNF secondary to AMS. PMH includes ongoing  alcohol abuse, HTN, COPD, paranoid schizophrenia, depression/anxiety, nonischemic cardiomyopathy, tobacco abuse, CAD, CHF, noncompliance with medications, recent small incidental PE in Nov 2022  ?Clinical Impression ? Pt admitted secondary to problem above with deficits below. Pt with decreased safety awareness and poor memory. Requiring min guard up to min A for mobility tasks this session. Had difficulty finding room even after very short walk. 1 LOB noted and required min A for steadying to safely return to sitting. Recommending SNF level therapies at d/c, and would likely benefit from memory care long term if family cannot provide 24/7 assist. Will continue to follow acutely to maximize functional mobility independence and safety.    ?   ? ?Recommendations for follow up therapy are one component of a multi-disciplinary discharge planning process, led by the attending physician.  Recommendations may be updated based on patient status, additional functional criteria and insurance authorization. ? ?Follow Up Recommendations Skilled nursing-short term rehab (<3 hours/day) (would likely benefit from memory care long term) ? ?  ?Assistance Recommended at Discharge Frequent or constant Supervision/Assistance  ?Patient can return home with the following ? A little help with walking and/or transfers;A little help with bathing/dressing/bathroom;Assistance with cooking/housework;Direct supervision/assist for medications management;Direct supervision/assist for financial management;Assist for transportation;Help with stairs or ramp for entrance ? ?  ?Equipment Recommendations None recommended by PT  ?Recommendations for Other Services ?    ?  ?Functional Status Assessment Patient has had a  recent decline in their functional status and demonstrates the ability to make significant improvements in function in a reasonable and predictable amount of time.  ? ?  ?Precautions / Restrictions Precautions ?Precautions: Fall ?Restrictions ?Weight Bearing Restrictions: No  ? ?  ? ?Mobility ? Bed Mobility ?  ?  ?  ?  ?  ?  ?  ?General bed mobility comments: Sitting EOB upon entry ?  ? ?Transfers ?Overall transfer level: Needs assistance ?Equipment used: None ?Transfers: Sit to/from Stand ?Sit to Stand: Min guard ?  ?  ?  ?  ?  ?General transfer comment: Min guard A for safety. Pt impulsive and requiring cues to wait for PT. ?  ? ?Ambulation/Gait ?Ambulation/Gait assistance: Min guard, Min assist ?Gait Distance (Feet): 50 Feet ?Assistive device: None ?Gait Pattern/deviations: Step-through pattern, Decreased stride length, Shuffle ?Gait velocity: decreased ?  ?  ?General Gait Details: Pt easily distracted and requiring safety cues throughout. Required directional cues to make it back into room. Pt with increased SOB and unsteadiness upon return to room. Required min A to safely return to sitting. ? ?Stairs ?  ?  ?  ?  ?  ? ?Wheelchair Mobility ?  ? ?Modified Rankin (Stroke Patients Only) ?  ? ?  ? ?Balance Overall balance assessment: Needs assistance ?Sitting-balance support: Feet supported ?Sitting balance-Leahy Scale: Fair ?  ?  ?Standing balance support: No upper extremity supported ?Standing balance-Leahy Scale: Poor ?Standing balance comment: Reliant on external support ?  ?  ?  ?  ?  ?  ?  ?  ?  ?  ?  ?   ? ? ? ?Pertinent Vitals/Pain Pain Assessment ?Pain Assessment: No/denies pain  ? ? ?Home Living Family/patient expects to be discharged to:: Private residence ?Living Arrangements: Parent ?Available Help at Discharge: Friend(s);Available PRN/intermittently;Family ?Type of  Home: House ?Home Access: Stairs to enter ?Entrance Stairs-Rails: Right;Left;Can reach both ?Entrance Stairs-Number of Steps: 5 ?Alternate  Level Stairs-Number of Steps: flight ?Home Layout: Two level ?Home Equipment: Conservation officer, nature (2 wheels);Rollator (4 wheels);Cane - single point;Wheelchair - manual;Tub bench ?Additional Comments: Reports his mother is at home, but unsure of accuracy. Pt reporting conflicting information from previous information  ?  ?Prior Function Prior Level of Function : Independent/Modified Independent ?  ?  ?  ?  ?  ?  ?Mobility Comments: Pt reports he is normally independent, but per previous notes, uses cane vs RW ?  ?  ? ? ?Hand Dominance  ?   ? ?  ?Extremity/Trunk Assessment  ? Upper Extremity Assessment ?Upper Extremity Assessment: Defer to OT evaluation ?  ? ?Lower Extremity Assessment ?Lower Extremity Assessment: Generalized weakness ?  ? ?Cervical / Trunk Assessment ?Cervical / Trunk Assessment: Kyphotic  ?Communication  ? Communication: No difficulties  ?Cognition Arousal/Alertness: Awake/alert ?Behavior During Therapy: Impulsive ?Overall Cognitive Status: Impaired/Different from baseline ?Area of Impairment: Orientation, Attention, Memory, Following commands, Safety/judgement, Awareness, Problem solving ?  ?  ?  ?  ?  ?  ?  ?  ?Orientation Level: Disoriented to, Place, Time, Situation ?Current Attention Level: Focused ?Memory: Decreased recall of precautions, Decreased short-term memory ?Following Commands: Follows one step commands with increased time ?Safety/Judgement: Decreased awareness of safety, Decreased awareness of deficits ?Awareness: Intellectual ?Problem Solving: Difficulty sequencing, Requires verbal cues ?General Comments: Pt with difficulty sequencing and poor memory deficits. Unable to remember words during 3 word recall task. ?  ?  ? ?  ?General Comments   ? ?  ?Exercises    ? ?Assessment/Plan  ?  ?PT Assessment Patient needs continued PT services  ?PT Problem List Decreased activity tolerance;Decreased balance;Decreased mobility;Decreased knowledge of use of DME;Decreased safety awareness;Decreased  knowledge of precautions ? ?   ?  ?PT Treatment Interventions DME instruction;Gait training;Functional mobility training;Stair training;Therapeutic activities;Therapeutic exercise;Balance training;Patient/family education   ? ?PT Goals (Current goals can be found in the Care Plan section)  ?Acute Rehab PT Goals ?Patient Stated Goal: to go home ?PT Goal Formulation: With patient ?Time For Goal Achievement: 01/28/22 ?Potential to Achieve Goals: Good ? ?  ?Frequency Min 2X/week ?  ? ? ?Co-evaluation   ?  ?  ?  ?  ? ? ?  ?AM-PAC PT "6 Clicks" Mobility  ?Outcome Measure Help needed turning from your back to your side while in a flat bed without using bedrails?: A Little ?Help needed moving from lying on your back to sitting on the side of a flat bed without using bedrails?: A Little ?Help needed moving to and from a bed to a chair (including a wheelchair)?: A Little ?Help needed standing up from a chair using your arms (e.g., wheelchair or bedside chair)?: A Little ?Help needed to walk in hospital room?: A Little ?Help needed climbing 3-5 steps with a railing? : A Lot ?6 Click Score: 17 ? ?  ?End of Session Equipment Utilized During Treatment: Gait belt ?Activity Tolerance: Patient tolerated treatment well ?Patient left: in bed;with call bell/phone within reach;with nursing/sitter in room (in ED with sitter present) ?Nurse Communication: Mobility status ?PT Visit Diagnosis: Unsteadiness on feet (R26.81) ?  ? ?Time: 4193-7902 ?PT Time Calculation (min) (ACUTE ONLY): 12 min ? ? ?Charges:   PT Evaluation ?$PT Eval Moderate Complexity: 1 Mod ?  ?  ?   ? ? ?Reuel Derby, PT, DPT  ?Acute Rehabilitation Services  ?Pager: [phone] ?  Office: [phone] ? ? ?[place] ?01/14/2022, 11:06 AM ?

## 2022-01-14 NOTE — BH Assessment (Signed)
Clinician was contacted via internal secure IM at 0335 by pt's nurse, Lenice Pressman, inquiring about having pt's Faith Assessment completed. Pt's other nurse, [place] RN, stated via internal secure IM at [phone] that they are currently working to have pt's medical clearance documented by pt's EDP and will be in contact when the note is written. TTS to complete pt's MH Assessment once documentation of medical clearance is complete. ?
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
## 2022-01-14 NOTE — Progress Notes (Signed)
Patient found standing at bedside going through bedside cabinet stating that he is "looking for something" but cannot recall what. Patient has then attempts to ambulate to hallway and is redirected by nurse and placed back in bed. Patient verbalizes understanding of need to stay in bed. Call bell in reach.  ?

## 2022-01-14 NOTE — ED Notes (Signed)
Have to keep the patient engaged, at the desk of walking with sitter. If not patient attempts to leave, stating he needs cigarettes, needs to go to the store, etc. Has to be repeatedly redirected. ?

## 2022-01-14 NOTE — ED Notes (Signed)
Patient attempted to walk out stating "I'm going home" patient  was easily redirected back to his room and has gotten in the bed at this time-Monique,RN  ?

## 2022-01-14 NOTE — Progress Notes (Signed)
Memory Care Units  ? ?[place]- No male beds  ?[facility]- Denied  ?[place]- Referral sent  ?[place]- left message  ?[place] ?[facility]- Denied  ?[place] [facility]- Message left  ?[facility]- Left message  ?[facility] [place]-Sent referral  ?[place] referral  ?[place] referral  ?[place] referral  ?

## 2022-01-14 NOTE — ED Provider Notes (Signed)
7:18 AM ?Urine returned overnight showing no evidence of UTI.  Based on previous team's plan, patient is medically clear for psychiatric and social work disposition. ?  ?[name], Gwenyth Allegra, MD ?01/14/22 [phone] ? ?

## 2022-01-15 MED ORDER — EMTRICITABINE-TENOFOVIR AF 200-25 MG PO TABS
1.0000 | ORAL_TABLET | Freq: Every day | ORAL | Status: DC
  Filled 2022-01-15: qty 1

## 2022-01-15 NOTE — ED Notes (Signed)
Breakfast order placed ?

## 2022-01-15 NOTE — ED Notes (Signed)
Patient has started coming in and out his room, denies that this is a hospital and saying he needs to go.  ?

## 2022-01-15 NOTE — TOC Progression Note (Signed)
Transition of Care (TOC) - Progression Note  ? ? ?Patient Details  ?Name: [name] ?MRN: [id number] ?Date of Birth: [birth year] ? ?Transition of Care (TOC) CM/SW Contact  ?Alfredia Ferguson, LCSW ?Phone Number: ?01/15/2022, 8:28 AM ? ?Clinical Narrative:    ?CSW reviewed and sent additional referrals to [facility] and [facility]. ? ? ?  ?  ? ?Expected Discharge Plan and Services ?  ?  ?  ?  ?  ?                ?  ?  ?  ?  ?  ?  ?  ?  ?  ?  ? ? ?Social Determinants of Health (SDOH) Interventions ?  ? ?Readmission Risk Interventions ?   ? View : No data to display.  ?  ?  ?  ? ? ?

## 2022-01-15 NOTE — ED Provider Notes (Addendum)
Emergency Medicine Observation Re-evaluation Note ? ?[name] is a 61 y.o. male, seen on rounds today.  Pt initially presented to the ED for complaints of behavioral symptoms at SNF, wandering away from former SNF.  ? ?Physical Exam  ?BP 115/67 (BP Location: Left Arm)   Pulse 86   Temp 98.7 ?F (37.1 ?C) (Oral)   Resp 16   Ht 1.88 m (6\' 2" )   Wt 81.6 kg   SpO2 97%   BMI 23.11 kg/m?  ?Physical Exam ?General: alert, content.  ?Cardiac: regular rate.  ?Lungs: breathing comfortably.  ?Psych: alert, content, calm. Pt does not appear to be responding to internal stimuli currently. Voices no thoughts of harm to self or others.  ? ?ED Course / MDM  ? ? ?I have reviewed the labs performed to date as well as medications administered while in observation.  Recent changes in the last 24 hours include ED obs, med management, and reassessment.  ? ?Plan  ? ? [name] is not under involuntary commitment. ? ?[place] team has declared psych cleared.  TOC team working on placement.  ? ?It appears patients current SNF should have worked with patient, family/guardian to find new SNF if they felt theirs was not the most appropriate for patient (as opposed to sending to ED and refusing to take back).   TOC will work with patient, facility, guardian to secure new placement for patient.  ? ?On review meds, pt had previousy been on descovy as pre-exp prophl - given current situation, and in ED awaiting placement, will hold.  ?  ? ? ?  ?Ronnie Saver, MD ?01/15/22 1712 ? ?

## 2022-01-15 NOTE — ED Notes (Signed)
Lunch has arrived ?

## 2022-01-15 NOTE — ED Notes (Signed)
Visitor at bedside.

## 2022-01-15 NOTE — Progress Notes (Signed)
[name] NP has psych cleared patient. This pt will now be removed from [facility] shift report and TOC CSW will assist with pt's discharge needs. ? ? ?Benjaman Kindler, MSW, LCSWA ?01/15/2022 12:47 AM ? ? ?

## 2022-01-16 ENCOUNTER — Encounter (HOSPITAL_COMMUNITY): Payer: Self-pay | Admitting: Emergency Medicine

## 2022-01-16 LAB — CBG MONITORING, ED: Glucose-Capillary: 152 mg/dL — ABNORMAL HIGH (ref 70–99)

## 2022-01-16 NOTE — ED Notes (Signed)
Patient ambulated into hallway states he was looking for his belongings. Patient redirected back into room.  ?

## 2022-01-16 NOTE — ED Provider Notes (Signed)
Emergency Medicine Observation Re-evaluation Note [name] is a 61 y.o. male, seen on rounds today.  Pt initially presented to the ED for complaints of dementia w behavioral symptoms.  Pts former SNF, [facility], had allowed patient to sign out, and then they sent him to ED (refusing back), instead finding him new placement.  Pt currently alert, content, without new c/o - has been awaiting placement.  ? ?Physical Exam  ?BP 115/71 (BP Location: Right Arm)   Pulse 81   Temp 99.1 ?F (37.3 ?C) (Oral)   Resp 16   Ht 1.88 m (6\' 2" )   Wt 81.6 kg   SpO2 100%   BMI 23.11 kg/m?  ?Physical Exam ?General: alert, content.  ?Cardiac: regular rate/ ?Lungs:  breathing comfortably ?Psych: alert, content. Normal mood and affect. Not currently responding to internal stimuli.  ? ?ED Course / MDM  ? ? ?I have reviewed the labs performed to date as well as medications administered while in observation.  Recent changes in the last 24 hours include ED obs, reassessment and placement efforts. . ? ?Plan  ? ? [name] is not under involuntary commitment. ? ?Pt is medically and psych cleared, at his baseline - pt is awaiting TOC placement.  ? ? ?  ?Lajean Saver, MD ?01/16/22 1134 ? ?

## 2022-01-16 NOTE — ED Notes (Signed)
Pt now asleep in bed ?

## 2022-01-16 NOTE — ED Notes (Signed)
Report to [place], [place] ?

## 2022-01-16 NOTE — ED Notes (Signed)
Pt found repeatedly wandering down the hall, pleasantly confused but not remaining in his bed. Easily redirectable back to his hallway bed. Pt continues to be very cooperative and pleasant. Plan to offer PO PRN ativan with other due medications for his ongoing wandering issues.  ?

## 2022-01-17 ENCOUNTER — Emergency Department (HOSPITAL_COMMUNITY)
Admission: EM | Admit: 2022-01-17 | Discharge: 2022-02-01 | Disposition: A | Discharge status: Skilled Nursing Facility | Payer: Medicare Other | Source: Home / Self Care | Attending: Emergency Medicine | Admitting: Emergency Medicine

## 2022-01-17 ENCOUNTER — Encounter (HOSPITAL_COMMUNITY): Payer: Self-pay

## 2022-01-17 ENCOUNTER — Other Ambulatory Visit: Payer: Self-pay

## 2022-01-17 DIAGNOSIS — R410 Disorientation, unspecified: Secondary | ICD-10-CM | POA: Insufficient documentation

## 2022-01-17 DIAGNOSIS — D72829 Elevated white blood cell count, unspecified: Secondary | ICD-10-CM | POA: Insufficient documentation

## 2022-01-17 LAB — CBC WITH DIFFERENTIAL/PLATELET
Abs Immature Granulocytes: 0.14 10*3/uL — ABNORMAL HIGH (ref 0.00–0.07)
Basophils Absolute: 0 10*3/uL (ref 0.0–0.1)
Basophils Relative: 0 %
Eosinophils Absolute: 0 10*3/uL (ref 0.0–0.5)
Eosinophils Relative: 0 %
HCT: 40.8 % (ref 39.0–52.0)
Hemoglobin: 13.8 g/dL (ref 13.0–17.0)
Immature Granulocytes: 1 %
Lymphocytes Relative: 7 %
Lymphs Abs: 1 10*3/uL (ref 0.7–4.0)
MCH: 31.9 pg (ref 26.0–34.0)
MCHC: 33.8 g/dL (ref 30.0–36.0)
MCV: 94.4 fL (ref 80.0–100.0)
Monocytes Absolute: 1.4 10*3/uL — ABNORMAL HIGH (ref 0.1–1.0)
Monocytes Relative: 10 %
Neutro Abs: 11.2 10*3/uL — ABNORMAL HIGH (ref 1.7–7.7)
Neutrophils Relative %: 82 %
Platelets: 170 10*3/uL (ref 150–400)
RBC: 4.32 MIL/uL (ref 4.22–5.81)
RDW: 15.1 % (ref 11.5–15.5)
WBC: 13.7 10*3/uL — ABNORMAL HIGH (ref 4.0–10.5)
nRBC: 0 % (ref 0.0–0.2)

## 2022-01-17 LAB — COMPREHENSIVE METABOLIC PANEL
ALT: 68 U/L — ABNORMAL HIGH (ref 0–44)
AST: 47 U/L — ABNORMAL HIGH (ref 15–41)
Albumin: 3.2 g/dL — ABNORMAL LOW (ref 3.5–5.0)
Alkaline Phosphatase: 63 U/L (ref 38–126)
Anion gap: 7 (ref 5–15)
BUN: 9 mg/dL (ref 8–23)
CO2: 25 mmol/L (ref 22–32)
Calcium: 9.4 mg/dL (ref 8.9–10.3)
Chloride: 110 mmol/L (ref 98–111)
Creatinine, Ser: 0.98 mg/dL (ref 0.61–1.24)
GFR, Estimated: >60 mL/min (ref >60)
Glucose, Bld: 174 mg/dL — ABNORMAL HIGH (ref 70–99)
Potassium: 3.7 mmol/L (ref 3.5–5.1)
Sodium: 142 mmol/L (ref 135–145)
Total Bilirubin: 0.7 mg/dL (ref 0.3–1.2)
Total Protein: 6.4 g/dL — ABNORMAL LOW (ref 6.5–8.1)

## 2022-01-17 LAB — ETHANOL: Alcohol, Ethyl (B): <10 mg/dL (ref <10)

## 2022-01-17 MED ORDER — BUSPIRONE HCL 10 MG PO TABS
30.0000 mg | ORAL_TABLET | Freq: Two times a day (BID) | ORAL | Status: DC
  Administered 2022-01-17 – 2022-02-01 (×31): 30 mg via ORAL
  Filled 2022-01-17 (×31): qty 3

## 2022-01-17 MED ORDER — DONEPEZIL HCL 5 MG PO TABS
5.0000 mg | ORAL_TABLET | Freq: Every day | ORAL | Status: DC
Start: 2022-01-17 — End: 2022-02-01
  Administered 2022-01-17 – 2022-01-31 (×15): 5 mg via ORAL
  Filled 2022-01-17 (×15): qty 1

## 2022-01-17 MED ORDER — THIAMINE HCL 100 MG PO TABS
100.0000 mg | ORAL_TABLET | Freq: Every day | ORAL | Status: DC
  Administered 2022-01-17 – 2022-02-01 (×16): 100 mg via ORAL
  Filled 2022-01-17 (×16): qty 1

## 2022-01-17 MED ORDER — ALBUTEROL SULFATE (2.5 MG/3ML) 0.083% IN NEBU: 2.5000 mg | INHALATION_SOLUTION | Freq: Four times a day (QID) | RESPIRATORY_TRACT | Status: DC | PRN

## 2022-01-17 MED ORDER — LORAZEPAM 1 MG PO TABS
1.0000 mg | ORAL_TABLET | Freq: Four times a day (QID) | ORAL | Status: DC | PRN
  Administered 2022-01-17 – 2022-01-30 (×17): 1 mg via ORAL
  Filled 2022-01-17 (×18): qty 1

## 2022-01-17 MED ORDER — ACETAMINOPHEN 500 MG PO TABS
1000.0000 mg | ORAL_TABLET | Freq: Two times a day (BID) | ORAL | Status: DC | PRN
  Administered 2022-01-23 – 2022-01-24 (×2): 1000 mg via ORAL
  Filled 2022-01-17 (×3): qty 2

## 2022-01-17 MED ORDER — NICOTINE 21 MG/24HR TD PT24
21.0000 mg | MEDICATED_PATCH | Freq: Every day | TRANSDERMAL | Status: DC
  Administered 2022-01-17 – 2022-02-01 (×16): 21 mg via TRANSDERMAL
  Filled 2022-01-17 (×16): qty 1

## 2022-01-17 MED ORDER — CARVEDILOL 3.125 MG PO TABS
3.1250 mg | ORAL_TABLET | Freq: Two times a day (BID) | ORAL | Status: DC
  Administered 2022-01-17 – 2022-02-01 (×30): 3.125 mg via ORAL
  Filled 2022-01-17 (×42): qty 1

## 2022-01-17 MED ORDER — POLYETHYLENE GLYCOL 3350 17 G PO PACK: 17.0000 g | PACK | Freq: Every day | ORAL | Status: DC | PRN

## 2022-01-17 MED ORDER — FOLIC ACID 1 MG PO TABS
1.0000 mg | ORAL_TABLET | Freq: Every day | ORAL | Status: DC
  Administered 2022-01-17 – 2022-02-01 (×16): 1 mg via ORAL
  Filled 2022-01-17 (×16): qty 1

## 2022-01-17 MED ORDER — SERTRALINE HCL 100 MG PO TABS
200.0000 mg | ORAL_TABLET | Freq: Every day | ORAL | Status: DC
  Administered 2022-01-17 – 2022-02-01 (×16): 200 mg via ORAL
  Filled 2022-01-17 (×16): qty 2

## 2022-01-17 MED ORDER — ALBUTEROL SULFATE HFA 108 (90 BASE) MCG/ACT IN AERS: 2.0000 | INHALATION_SPRAY | RESPIRATORY_TRACT | Status: DC | PRN

## 2022-01-17 MED ORDER — PRAVASTATIN SODIUM 40 MG PO TABS
40.0000 mg | ORAL_TABLET | Freq: Every day | ORAL | Status: DC
  Administered 2022-01-17 – 2022-02-01 (×16): 40 mg via ORAL
  Filled 2022-01-17 (×16): qty 1

## 2022-01-17 MED ORDER — PANTOPRAZOLE SODIUM 40 MG PO TBEC
40.0000 mg | DELAYED_RELEASE_TABLET | Freq: Every day | ORAL | Status: DC
  Administered 2022-01-17 – 2022-02-01 (×16): 40 mg via ORAL
  Filled 2022-01-17 (×16): qty 1

## 2022-01-17 MED ORDER — UMECLIDINIUM-VILANTEROL 62.5-25 MCG/ACT IN AEPB
1.0000 | INHALATION_SPRAY | Freq: Every day | RESPIRATORY_TRACT | Status: DC
  Administered 2022-01-19 – 2022-02-01 (×8): 1 via RESPIRATORY_TRACT
  Filled 2022-01-17 (×2): qty 14

## 2022-01-17 MED ORDER — QUETIAPINE FUMARATE ER 200 MG PO TB24
200.0000 mg | ORAL_TABLET | Freq: Every day | ORAL | Status: DC
Start: 2022-01-17 — End: 2022-02-01
  Administered 2022-01-17 – 2022-01-31 (×15): 200 mg via ORAL
  Filled 2022-01-17 (×17): qty 1

## 2022-01-17 MED ORDER — TAMSULOSIN HCL 0.4 MG PO CAPS
0.4000 mg | ORAL_CAPSULE | Freq: Every day | ORAL | Status: DC
  Administered 2022-01-17 – 2022-02-01 (×16): 0.4 mg via ORAL
  Filled 2022-01-17 (×21): qty 1

## 2022-01-17 NOTE — ED Triage Notes (Addendum)
BIB after leaving hospital this morning (eloping) while waiting as a boarder for SNF.  Patient is A&Ox2.  Denies any pain or complaints.  Found at sisters house.  Reports he did drink 2 12oz beers and smoked some cigaretter.  CBG 149 with EMS ?

## 2022-01-17 NOTE — ED Notes (Signed)
Safety sitter at bedside now.   ?

## 2022-01-17 NOTE — NC FL2 (Signed)
?[place] MEDICAID FL2 LEVEL OF CARE SCREENING TOOL  ?  ? ?IDENTIFICATION  ?Patient Name: ?[name] Birthdate: 1961-07-14 Sex: male Admission Date (Current Location): ?01/17/2022  ?[place] and [place] Number: ? [place] ?  Facility and Address:  ?[facility], [place] [address], [place], [place] [address] ?     Provider Number: ?[id number]  ?Attending Physician Name and Address:  ?Margette Fast, MD ? Relative Name and Phone Number:  ?Marice Potter, [phone], Jiles Garter, sister, [phone] ?   ?Current Level of Care: ?Hospital Recommended Level of Care: ?Memory Care, Other (Comment) (Long-term Care) Prior Approval Number: ?  ? ?Date Approved/Denied: ?  PASRR Number: ?  ? ?Discharge Plan: ?Other (Comment) (Memory care/Long-term care) ?  ? ?Current Diagnoses: ?Patient Active Problem List  ? Diagnosis Date Noted  ? DTs (delirium tremens) ([facility]) 01/05/2022  ? AMS (altered mental status) 01/03/2022  ? Severe episode of recurrent major depressive disorder, without psychotic features ([place]) 07/31/2021  ? Pulmonary embolism ([place]) 07/30/2021  ? Suicidal ideation 07/30/2021  ? Long toenail 01/11/2021  ? Aortic atherosclerosis ([place])   ? Generalized anxiety disorder 11/18/2020  ? Moderate episode of recurrent major depressive disorder ([place]) 11/18/2020  ? Panic disorder 11/18/2020  ? Schizophrenia ([place]) 05/18/2020  ? Thiamine deficiency 12/10/2019  ? Screening-pulmonary TB 12/04/2019  ? Irritable bowel syndrome with diarrhea 07/01/2019  ? Type II diabetes mellitus with manifestations ([place]) 06/26/2019  ? De Quervain's tenosynovitis, left 04/11/2018  ? 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 Insomnia w/ sleep apnea 11/30/2016  ? Syphili, latent 03/05/2016  ? Glaucoma suspect of both eyes 11/19/2015  ? Non-small cell carcinoma of lung, stage 1 ([place]) 10/12/2015  ? COPD GOLD II if use fev1/VC and still smoking  08/11/2015  ? High risk homosexual behavior 07/09/2014  ? PUD (peptic  ulcer disease) 01/09/2013  ? Routine general medical examination at a health care facility 06/04/2012  ? Paranoid schizophrenia ([place]) 08/01/2011  ? DJD (degenerative joint disease) of knee 03/15/2011  ? Obstructive sleep apnea 11/29/2010  ? 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 Allergic rhinitis 03/17/2010  ? Smoking 12/14/2009  ? HYPERTENSION, BENIGN 10/05/2009  ? Nonischemic cardiomyopathy ([place]) 10/05/2009  ? PAD (peripheral artery disease) ([place]) 09/15/2009  ? Hx of adenomatous colonic polyps 12/03/2008  ? Hyperlipidemia with target LDL less than 130 06/23/2008  ? BPH associated with nocturia 06/23/2008  ? ? ?Orientation RESPIRATION BLADDER Height & Weight   ?  ?Self ? Normal Continent Weight: 180 lb (81.6 kg) ?Height:  6\' 2"  (188 cm)  ?BEHAVIORAL SYMPTOMS/MOOD NEUROLOGICAL BOWEL NUTRITION STATUS  ?[place]   Continent Diet (Regular)  ?AMBULATORY STATUS COMMUNICATION OF NEEDS Skin   ?Independent Verbally Normal ?  ?  ?  ?    ?     ?     ? ? ?Personal Care Assistance Level of Assistance  ?Bathing, Feeding, Dressing Bathing Assistance: Limited assistance ?Feeding assistance: Independent ?Dressing Assistance: Limited assistance ?   ? ?Functional Limitations Info  ?Sight, Hearing, Speech Sight Info: Adequate ?Hearing Info: Adequate ?Speech Info: Adequate  ? ? ?SPECIAL CARE FACTORS FREQUENCY  ?    ?  ?  ?  ?  ?  ?  ?   ? ? ?Contractures Contractures Info: Not present  ? ? ?Additional Factors Info  ?Code Status, Allergies Code Status Info: Full ?Allergies Info: Ace Inhibitors Aspirin Clopidogrel Bisulfate Crestor (Rosuvastatin Calcium) Metformin And Related Rosuvastatin ?  ?  ?  ?   ? ?  Current Medications (01/17/2022):  This is the current hospital active medication list ?Current Facility-Administered Medications  ?Medication Dose Route Frequency Provider Last Rate Last Admin  ? acetaminophen (TYLENOL) tablet 1,000 mg  1,000 mg Oral BID PRN Long, Wonda Olds, MD      ? albuterol (PROVENTIL) (2.5 MG/3ML) 0.083%  nebulizer solution 2.5 mg  2.5 mg Nebulization Q6H PRN Long, Wonda Olds, MD      ? busPIRone (BUSPAR) tablet 30 mg  30 mg Oral BID Long, Wonda Olds, MD   30 mg at 01/17/22 1124  ? carvedilol (COREG) tablet 3.125 mg  3.125 mg Oral BID WC Long, Wonda Olds, MD      ? donepezil (ARICEPT) tablet 5 mg  5 mg Oral QHS Long, Wonda Olds, MD      ? folic acid (FOLVITE) tablet 1 mg  1 mg Oral Daily Long, Wonda Olds, MD   1 mg at 01/17/22 1124  ? nicotine (NICODERM CQ - dosed in mg/24 hours) patch 21 mg  21 mg Transdermal Daily Long, Wonda Olds, MD   21 mg at 01/17/22 1124  ? pantoprazole (PROTONIX) EC tablet 40 mg  40 mg Oral Daily Long, Wonda Olds, MD   40 mg at 01/17/22 1123  ? polyethylene glycol (MIRALAX / GLYCOLAX) packet 17 g  17 g Oral Daily PRN Long, Wonda Olds, MD      ? pravastatin (PRAVACHOL) tablet 40 mg  40 mg Oral Daily Long, Wonda Olds, MD   40 mg at 01/17/22 1124  ? QUEtiapine (SEROQUEL XR) 24 hr tablet 200 mg  200 mg Oral QHS Long, Wonda Olds, MD      ? sertraline (ZOLOFT) tablet 200 mg  200 mg Oral Daily Long, Wonda Olds, MD   200 mg at 01/17/22 1124  ? tamsulosin (FLOMAX) capsule 0.4 mg  0.4 mg Oral Daily Long, Wonda Olds, MD   0.4 mg at 01/17/22 1124  ? thiamine tablet 100 mg  100 mg Oral Daily Long, Wonda Olds, MD   100 mg at 01/17/22 1123  ? umeclidinium-vilanterol (ANORO ELLIPTA) 62.5-25 MCG/ACT 1 puff  1 puff Inhalation Daily Long, Wonda Olds, MD      ? ?Current Outpatient Medications  ?Medication Sig Dispense Refill  ? acetaminophen (TYLENOL) 500 MG tablet Take 1,000 mg by mouth 2 (two) times daily as needed for moderate pain.    ? albuterol (PROVENTIL) (2.5 MG/3ML) 0.083% nebulizer solution Take 3 mLs (2.5 mg total) by nebulization every 6 (six) hours as needed for wheezing or shortness of breath. 150 mL 2  ? albuterol (VENTOLIN HFA) 108 (90 Base) MCG/ACT inhaler Inhale 2 puffs into the lungs every 4 (four) hours as needed for wheezing. 25.5 g 3  ? ANORO ELLIPTA 62.5-25 MCG/ACT AEPB Inhale 1 puff into the lungs daily. 30 each 0   ? aspirin 81 MG chewable tablet Chew 1 tablet (81 mg total) by mouth daily.    ? busPIRone (BUSPAR) 30 MG tablet Take 1 tablet (30 mg total) by mouth 2 (two) times daily. 60 tablet 1  ? carvedilol (COREG) 3.125 MG tablet Take 1 tablet (3.125 mg total) by mouth 2 (two) times daily with a meal.    ? DESCOVY 200-25 MG tablet Take 1 tablet by mouth daily. 90 tablet 0  ? donepezil (ARICEPT) 5 MG tablet Take 5 mg by mouth at bedtime.    ? folic acid (FOLVITE) 1 MG tablet Take 1 tablet (1 mg total) by mouth daily. 30 tablet 0  ?  nicotine (NICODERM CQ - DOSED IN MG/24 HOURS) 21 mg/24hr patch Place 1 patch (21 mg total) onto the skin daily. 28 patch 0  ? pantoprazole (PROTONIX) 40 MG tablet Take 1 tablet (40 mg total) by mouth daily. 30 tablet 0  ? polyethylene glycol (MIRALAX / GLYCOLAX) 17 g packet Take 17 g by mouth daily as needed. (Patient taking differently: Take 17 g by mouth daily as needed for mild constipation.) 14 each 0  ? pravastatin (PRAVACHOL) 40 MG tablet Take 1 tablet (40 mg total) by mouth daily. 90 tablet 1  ? QUEtiapine (SEROQUEL XR) 200 MG 24 hr tablet Take 1 tablet (200 mg total) by mouth at bedtime. 30 tablet 0  ? sertraline (ZOLOFT) 100 MG tablet Take 2 tablets (200 mg total) by mouth daily. 60 tablet 1  ? tamsulosin (FLOMAX) 0.4 MG CAPS capsule Take 1 capsule (0.4 mg total) by mouth daily. 30 capsule   ? thiamine 100 MG tablet Take 1 tablet (100 mg total) by mouth daily.    ? ? ? ?Discharge Medications: ?Please see discharge summary for a list of discharge medications. ? ?Relevant Imaging Results: ? ?Relevant Lab Results: ? ? ?Additional Information ?SSN:  [id number];  [place] COVID-19 Vaccine 06/02/2020 , 01/01/2020 , 12/05/2019 ? ?Raina Mina, LCSWA ? ? ? ? ?

## 2022-01-17 NOTE — ED Notes (Signed)
Pt currently asleep in his bed.  ?

## 2022-01-17 NOTE — ED Notes (Signed)
Pt seen by ED staff wandering down hall into a different section of ED. Re-directed back to his hallway bed. Pt continues to be pleasantly confused and cooperative.  ?

## 2022-01-17 NOTE — ED Notes (Signed)
Breakfast order placed ?

## 2022-01-17 NOTE — ED Provider Notes (Signed)
? ?Emergency Department Provider Note ? ? ?I have reviewed the triage vital signs and the nursing notes. ? ? ?HISTORY ? ?Chief Complaint ?Altered Mental Status ? ? ?HPI ?[name] is a 61 y.o. male with past medical history reviewed returns to the emergency department after apparently wandering away from the department early this morning. Per chart review the patient was boarding in the ED awaiting SNF placement. Nursing notes reviewed. Patient apparently wandering throughout the halls but described as easy to redirect. He apparently eloped from the ED and ultimately arrived at his sister's house.  ? ?Patient denies any falls to me. Denies pain. He is cooperative and pleasant but confused.  ? ? ?Review of Systems ? ?Constitutional: No fever/chills ?Cardiovascular: Denies chest pain. ?Respiratory: Denies shortness of breath. ?Gastrointestinal: No abdominal pain. ?Skin: Negative for rash. ?Neurological: Negative for headaches. ? ?____________________________________________ ? ? ?PHYSICAL EXAM: ? ?VITAL SIGNS: ?ED Triage Vitals  ?Enc Vitals Group  ?   BP 01/17/22 0948 116/89  ?   Pulse Rate 01/17/22 0948 (!) 119  ?   Resp 01/17/22 0948 20  ?   Temp 01/17/22 0948 97.8 ?F (36.6 ?C)  ?   Temp Source 01/17/22 0948 Oral  ?   SpO2 01/17/22 0948 95 %  ?   Weight 01/17/22 0949 180 lb (81.6 kg)  ?   Height 01/17/22 0949 6\' 2"  (1.88 m)  ? ?Constitutional: Alert and conversational but confused.  ?Eyes: Conjunctivae are normal.  ?Head: Atraumatic. ?Nose: No congestion/rhinnorhea. ?Mouth/Throat: Mucous membranes are moist.   ?Neck: No stridor.   ?Cardiovascular: Normal rate, regular rhythm. Good peripheral circulation. Grossly normal heart sounds.   ?Respiratory: Normal respiratory effort.  No retractions. Lungs CTAB. ?Gastrointestinal: Soft and nontender. No distention.  ?Musculoskeletal: No lower extremity tenderness nor edema. No gross deformities of extremities. ?Neurologic:  Normal speech and language. No gross focal  neurologic deficits are appreciated.  ?Skin:  Skin is warm and dry. Superficial abrasions to the fingers of the right hand.  ? ? ?____________________________________________ ?  ?LABS ?(all labs ordered are listed, but only abnormal results are displayed) ? ?Labs Reviewed  ?COMPREHENSIVE METABOLIC PANEL - Abnormal; Notable for the following components:  ?    Result Value  ? Glucose, Bld 174 (*)   ? Total Protein 6.4 (*)   ? Albumin 3.2 (*)   ? AST 47 (*)   ? ALT 68 (*)   ? All other components within normal limits  ?CBC WITH DIFFERENTIAL/PLATELET - Abnormal; Notable for the following components:  ? WBC 13.7 (*)   ? Neutro Abs 11.2 (*)   ? Monocytes Absolute 1.4 (*)   ? Abs Immature Granulocytes 0.14 (*)   ? All other components within normal limits  ?ETHANOL  ?URINALYSIS, ROUTINE W REFLEX MICROSCOPIC  ? ? ?____________________________________________ ? ? ?PROCEDURES ? ?Procedure(s) performed:  ? ?Procedures ? ?None ? ?____________________________________________ ? ? ?INITIAL IMPRESSION / ASSESSMENT AND PLAN / ED COURSE ? ?Pertinent labs & imaging results that were available during my care of the patient were reviewed by me and considered in my medical decision making (see chart for details). ? ?Medical Decision Making: Summary:  ?Patient apparently eloped from the emergency department early this morning while awaiting skilled nursing facility placement.  He does not have any outward sign of injury.  I do appreciate some rations to the fingers in the right hand but no bony tenderness or laceration requiring repair.  Will repeat labs and UA as last blood work was  done 4 days prior.  I have consulted case management and placed order for one-to-one observation/sitter.  ? ?Labs significant for leukocytosis. No fever or other clear infection source at this time. UA is pending.  ? ?CM consulted. FL2 signed and discussion in progress for SNF placement. Home medications ordered.  ? ?Disposition: pending TOC   ? ?____________________________________________ ? ?FINAL CLINICAL IMPRESSION(S) / ED DIAGNOSES ? ?Final diagnoses:  ?Disorientation  ? ? ?Note:  This document was prepared using Dragon voice recognition software and may include unintentional dictation errors. ? ?Nanda Quinton, MD, FACEP ?Emergency Medicine ? ?  ?Margette Fast, MD ?01/17/22 1455 ? ?

## 2022-01-17 NOTE — ED Notes (Signed)
Per CN a "missing police report" needed to be filed .Marland Kitchen This RN called [employer] and filed a missing person report. They stated that someone will be out shortly to help with the search.  ?

## 2022-01-17 NOTE — ED Notes (Signed)
Patient up and wandering the halls, directed back to room.  Lying on stretcher at this time.  ?

## 2022-01-17 NOTE — Evaluation (Signed)
Physical Therapy Evaluation ?Patient Details ?Name: [name] ?MRN: [id number] ?DOB: August 01, 1961 ?Today's Date: 01/17/2022 ? ?History of Present Illness ? Pt is a 61 y/o male admitted from SNF secondary to AMS. During admission, wandered out of ED on 4/24 and was found and brought back into ED by EMS. PMH includes ongoing  alcohol abuse, HTN, COPD, paranoid schizophrenia, depression/anxiety, nonischemic cardiomyopathy, tobacco abuse, CAD, CHF, noncompliance with medications, recent small incidental PE in Nov 2022  ?Clinical Impression ? Pt admitted secondary to problem above with deficits below. Pt requiring min A for short distance ambulation within the room. Pt with increased shakiness from previous eval. Continues to exhibit significan cognitive deficits. Recommending SNF level care at d/c to address current deficits. Long term, pt would likely benefit from memory care. Will continue to follow acutely.    ?   ? ?Recommendations for follow up therapy are one component of a multi-disciplinary discharge planning process, led by the attending physician.  Recommendations may be updated based on patient status, additional functional criteria and insurance authorization. ? ?Follow Up Recommendations Skilled nursing-short term rehab (<3 hours/day) ? ?  ?Assistance Recommended at Discharge Frequent or constant Supervision/Assistance  ?Patient can return home with the following ? A little help with walking and/or transfers;A little help with bathing/dressing/bathroom;Assistance with cooking/housework;Direct supervision/assist for medications management;Direct supervision/assist for financial management;Assist for transportation;Help with stairs or ramp for entrance ? ?  ?Equipment Recommendations None recommended by PT  ?Recommendations for Other Services ?    ?  ?Functional Status Assessment Patient has had a recent decline in their functional status and demonstrates the ability to make significant improvements in  function in a reasonable and predictable amount of time.  ? ?  ?Precautions / Restrictions Precautions ?Precautions: Fall ?Restrictions ?Weight Bearing Restrictions: No  ? ?  ? ?Mobility ? Bed Mobility ?Overal bed mobility: Needs Assistance ?Bed Mobility: Supine to Sit, Sit to Supine ?  ?  ?Supine to sit: Min assist ?Sit to supine: Supervision ?  ?General bed mobility comments: Min A for trunk elevation. Increased shakiness noted upon sitting. ?  ? ?Transfers ?Overall transfer level: Needs assistance ?Equipment used: 1 person hand held assist ?Transfers: Sit to/from Stand ?Sit to Stand: Min assist ?  ?  ?  ?  ?  ?General transfer comment: Pt shaky upon standing and requiring min A for steadying. ?  ? ?Ambulation/Gait ?Ambulation/Gait assistance: Min guard, Min assist ?Gait Distance (Feet): 5 Feet ?Assistive device: 1 person hand held assist ?Gait Pattern/deviations: Step-through pattern, Decreased stride length ?Gait velocity: Decreased ?  ?  ?General Gait Details: Pt shaky during ambulation and requiring up to min A. Increased SOB noted, however, oxygen sats at 95-96% on RA. ? ?Stairs ?  ?  ?  ?  ?  ? ?Wheelchair Mobility ?  ? ?Modified Rankin (Stroke Patients Only) ?  ? ?  ? ?Balance Overall balance assessment: Needs assistance ?Sitting-balance support: Feet supported ?Sitting balance-Leahy Scale: Fair ?  ?  ?Standing balance support: Single extremity supported ?Standing balance-Leahy Scale: Poor ?Standing balance comment: Reliant on external support ?  ?  ?  ?  ?  ?  ?  ?  ?  ?  ?  ?   ? ? ? ?Pertinent Vitals/Pain Pain Assessment ?Pain Assessment: No/denies pain  ? ? ?Home Living Family/patient expects to be discharged to:: Private residence ?Living Arrangements: Alone ?Available Help at Discharge: Personal care attendant;Available PRN/intermittently ?Type of Home: Apartment ?Home Access: Stairs to enter ?Entrance Stairs-Rails:  Right;Left;Can reach both ?Entrance Stairs-Number of Steps: 5 ?Alternate Level  Stairs-Number of Steps: flight ?Home Layout: Two level ?Home Equipment: Conservation officer, nature (2 wheels);Rollator (4 wheels);Cane - single point;Wheelchair - manual;Tub bench ?Additional Comments: Reports aide comes 7 days/week. Reports completely different home set up than when seen 4/21.  ?  ?Prior Function Prior Level of Function : Independent/Modified Independent ?  ?  ?  ?  ?  ?  ?Mobility Comments: Pt reports he is normally independent, but per previous notes, uses cane vs RW ?  ?  ? ? ?Hand Dominance  ?   ? ?  ?Extremity/Trunk Assessment  ? Upper Extremity Assessment ?Upper Extremity Assessment: Defer to OT evaluation ?  ? ?Lower Extremity Assessment ?Lower Extremity Assessment: Generalized weakness (increased shakiness) ?  ? ?Cervical / Trunk Assessment ?Cervical / Trunk Assessment: Kyphotic  ?Communication  ? Communication: No difficulties  ?Cognition Arousal/Alertness: Awake/alert ?Behavior During Therapy: Impulsive ?Overall Cognitive Status: Impaired/Different from baseline ?Area of Impairment: Orientation, Attention, Memory, Following commands, Safety/judgement, Awareness, Problem solving ?  ?  ?  ?  ?  ?  ?  ?  ?Orientation Level: Disoriented to, Place, Time, Situation ?Current Attention Level: Focused ?Memory: Decreased recall of precautions, Decreased short-term memory ?Following Commands: Follows one step commands with increased time ?Safety/Judgement: Decreased awareness of safety, Decreased awareness of deficits ?Awareness: Intellectual ?Problem Solving: Slow processing, Difficulty sequencing ?General Comments: Pt reporting he was at home, did not know why he was in the hospital and reports it was july 2022. Pt with slow processing and decreased safety awareness. Wandered out of ED on 4/24 while awaiting placement. ?  ?  ? ?  ?General Comments   ? ?  ?Exercises    ? ?Assessment/Plan  ?  ?PT Assessment Patient needs continued PT services  ?PT Problem List Decreased activity tolerance;Decreased  balance;Decreased mobility;Decreased knowledge of use of DME;Decreased safety awareness;Decreased knowledge of precautions ? ?   ?  ?PT Treatment Interventions DME instruction;Gait training;Functional mobility training;Stair training;Therapeutic activities;Therapeutic exercise;Balance training;Patient/family education   ? ?PT Goals (Current goals can be found in the Care Plan section)  ?Acute Rehab PT Goals ?Patient Stated Goal: to go home ?PT Goal Formulation: With patient ?Time For Goal Achievement: 01/31/22 ?Potential to Achieve Goals: Good ? ?  ?Frequency Min 2X/week ?  ? ? ?Co-evaluation   ?  ?  ?  ?  ? ? ?  ?AM-PAC PT "6 Clicks" Mobility  ?Outcome Measure Help needed turning from your back to your side while in a flat bed without using bedrails?: A Little ?Help needed moving from lying on your back to sitting on the side of a flat bed without using bedrails?: A Little ?Help needed moving to and from a bed to a chair (including a wheelchair)?: A Little ?Help needed standing up from a chair using your arms (e.g., wheelchair or bedside chair)?: A Little ?Help needed to walk in hospital room?: A Lot ?Help needed climbing 3-5 steps with a railing? : A Lot ?6 Click Score: 16 ? ?  ?End of Session Equipment Utilized During Treatment: Gait belt ?Activity Tolerance: Patient tolerated treatment well ?Patient left: in bed;with call bell/phone within reach (on stretcher in ED) ?Nurse Communication: Mobility status ?PT Visit Diagnosis: Unsteadiness on feet (R26.81);Muscle weakness (generalized) (M62.81);Difficulty in walking, not elsewhere classified (R26.2) ?  ? ?Time: 1100-1113 ?PT Time Calculation (min) (ACUTE ONLY): 13 min ? ? ?Charges:   PT Evaluation ?$PT Eval Moderate Complexity: 1 Mod ?  ?  ?   ? ? ?  Reuel Derby, PT, DPT  ?Acute Rehabilitation Services  ?Pager: [phone] ?Office: [phone] ? ? ?[place] ?01/17/2022, 12:18 PM ?

## 2022-01-17 NOTE — ED Notes (Signed)
This RN spoke with [employer] and provided background information and physical description of pt.  ?

## 2022-01-17 NOTE — ED Notes (Signed)
[employer] officer arrived at [facility] ED to assist in search for pt ?

## 2022-01-17 NOTE — ED Notes (Signed)
Pt is beginning to wind down and is laying on the bed resting  ?

## 2022-01-17 NOTE — ED Notes (Signed)
Patient's niece came to visit but did not disturb patient while he was sleeping; Family updated on patient's mood for the day-Monique,RN  ?

## 2022-01-17 NOTE — ED Notes (Signed)
This RN spoke with ED security and [employer] for update on search for pt. This RN was informed that [employer] and security continue to search through camera footage for pt (no new updates for pt sighting on camera since 0510) and officers are currently searching grounds for pt.  ?

## 2022-01-17 NOTE — ED Notes (Signed)
Eating meal tray at this time ? ?

## 2022-01-17 NOTE — ED Notes (Signed)
Patient confused talking about riding home in a car with someone and that he has nothing to do tomorrow.  Reoriented and informed him he is to stay in his room.  Urinal at bedside.  ?

## 2022-01-17 NOTE — ED Notes (Signed)
Security still reviewing camera footage at this time for patient ?

## 2022-01-17 NOTE — ED Notes (Signed)
This RN called and spoke with security for update on camera footage review for pt. Per security, they are still reviewing internal camera footage to find pt and plan to now also begin reviewing outdoor camera footage. ?

## 2022-01-17 NOTE — ED Notes (Signed)
RN spoke with security about locating pt with camera footage. Per security, they are still reviewing the camera footage to locate pt. RN will continue to speak with security for updates.  ?

## 2022-01-17 NOTE — ED Notes (Signed)
Pt seen wandering around department while this RN was temporarily occupied with another pt. As reported to this RN, pt continued to ambulate self around and is currently not seen throughout department. This RN spoke with security and emphasized the need to watch pt on security camera footage in order to locate pt and guarantee continued safety for pt with regards to wandering nature and altered mental status. Charge RN aware of situation and plan.  ?

## 2022-01-17 NOTE — ED Notes (Signed)
[employer] officer stated to this RN that he will now go to ED security and assist them in search for pt ?

## 2022-01-17 NOTE — ED Notes (Signed)
This RN spoke with podmate RN [place] and requested that she continue to monitor this pt for wandering while this RN expects to be unavailable for anticipated critical EMS pt ?

## 2022-01-17 NOTE — ED Notes (Signed)
Pt is restless pacing back and forth in and out of his room. He is disoriented to place and situation. Only oriented to self. He keeps walking towards the exits and having to be reminded that he is in the hospital and he cannot leave. He is having difficulty remembering directions and things that were discussed only a couple minutes prior. He does have some obvious tremors in his hands.  ?

## 2022-01-17 NOTE — ED Notes (Signed)
Pt is showering  ?

## 2022-01-18 NOTE — ED Notes (Signed)
Breakfast order placed ?

## 2022-01-18 NOTE — Progress Notes (Signed)
Patient needs memory care placement or long term care in a dementia unit. The following referrals have been sent  ? ? ? ? ?[place]- No male beds  ?[facility] [place]- Denied  ?[place]- Referral sent  ?[place]- left message  ?[place] ?[facility]- Denied  ?[facility]- Denied ?[facility]- Denied ?[place] ?[place] referral  ?[place] referral  ?[place] referral  ?[employer]- Denied  ?[place] ?[facility]- No dementia unit  ?[place] patients to have special assistance. This can take 45 days to process with DSS.  ?

## 2022-01-18 NOTE — Progress Notes (Signed)
CSW spoke with patients niece, Hazim Treadway who is assisting CSW with patient applying for special assistance. [facility] Memory care requires patients to have special assistance. If patient is approved for special assistance CSW can send a memory care referral for review. Unknown if [place] would accept patient.  ?

## 2022-01-18 NOTE — [place] FL2 (Signed)
?[place] MEDICAID FL2 LEVEL OF CARE SCREENING TOOL  ?  ? ?IDENTIFICATION  ?Patient Name: ?[name] Birthdate: [birth year] Sex: male Admission Date (Current Location): ?01/17/2022  ?[place] and [place] Number: ? [place] ?  Facility and Address:  ?The . [facility], [place] [address] [place], [place] [address] ?     Provider Number: ?[id number]  ?Attending Physician Name and Address:  ?Default, Provider, MD ? Relative Name and Phone Number:  ?[name], [place], [phone], Jiles Garter, sister, [phone] ?   ?Current Level of Care: ?Hospital Recommended Level of Care: ?Other (Comment) (Special Assistance) Prior Approval Number: ?  ? ?Date Approved/Denied: ?  PASRR Number: ?  ? ?Discharge Plan: ?Other (Comment) (Special Assistance) ?  ? ?Current Diagnoses: ?Patient Active Problem List  ? Diagnosis Date Noted  ? DTs (delirium tremens) ([place]) 01/05/2022  ? AMS (altered mental status) 01/03/2022  ? Severe episode of recurrent major depressive disorder, without psychotic features ([place]) 07/31/2021  ? Pulmonary embolism ([place]) 07/30/2021  ? Suicidal ideation 07/30/2021  ? Long toenail 01/11/2021  ? Aortic atherosclerosis ([place])   ? Generalized anxiety disorder 11/18/2020  ? Moderate episode of recurrent major depressive disorder ([place]) 11/18/2020  ? Panic disorder 11/18/2020  ? Schizophrenia ([place]) 05/18/2020  ? Thiamine deficiency 12/10/2019  ? Screening-pulmonary TB 12/04/2019  ? Irritable bowel syndrome with diarrhea 07/01/2019  ? Type II diabetes mellitus with manifestations ([place]) 06/26/2019  ? De Quervain's tenosynovitis, left 04/11/2018  ? 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 Insomnia w/ sleep apnea 11/30/2016  ? Syphili, latent 03/05/2016  ? Glaucoma suspect of both eyes 11/19/2015  ? Non-small cell carcinoma of lung, stage 1 ([place]) 10/12/2015  ? COPD GOLD II if use fev1/VC and still smoking  08/11/2015  ? High risk homosexual behavior 07/09/2014  ? PUD (peptic ulcer disease)  01/09/2013  ? Routine general medical examination at a health care facility 06/04/2012  ? Paranoid schizophrenia ([place]) 08/01/2011  ? DJD (degenerative joint disease) of knee 03/15/2011  ? Obstructive sleep apnea 11/29/2010  ? 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 Allergic rhinitis 03/17/2010  ? Smoking 12/14/2009  ? HYPERTENSION, BENIGN 10/05/2009  ? Nonischemic cardiomyopathy ([place]) 10/05/2009  ? PAD (peripheral artery disease) ([place]) 09/15/2009  ? Hx of adenomatous colonic polyps 12/03/2008  ? Hyperlipidemia with target LDL less than 130 06/23/2008  ? BPH associated with nocturia 06/23/2008  ? ? ?Orientation RESPIRATION BLADDER Height & Weight   ?  ?Self ? Normal Continent Weight: 180 lb (81.6 kg) ?Height:  6\' 2"  (188 cm)  ?BEHAVIORAL SYMPTOMS/MOOD NEUROLOGICAL BOWEL NUTRITION STATUS  ?Wanderer   Continent Diet (Regular)  ?AMBULATORY STATUS COMMUNICATION OF NEEDS Skin   ?Limited Assist Verbally Normal ?  ?  ?  ?    ?     ?     ? ? ?Personal Care Assistance Level of Assistance  ?Bathing, Feeding, Dressing Bathing Assistance: Limited assistance ?Feeding assistance: Independent ?Dressing Assistance: Limited assistance ?   ? ?Functional Limitations Info  ?Sight, Hearing, Speech Sight Info: Adequate ?Hearing Info: Adequate ?Speech Info: Adequate  ? ? ?SPECIAL CARE FACTORS FREQUENCY  ?    ?  ?  ?  ?  ?  ?  ?   ? ? ?Contractures Contractures Info: Not present  ? ? ?Additional Factors Info  ?Code Status, Allergies Code Status Info: Full ?Allergies Info: Ace Inhibitors Aspirin Clopidogrel Bisulfate Crestor (Rosuvastatin Calcium) Metformin And Related Rosuvastatin ?  ?  ?  ?   ? ?Current  Medications (01/18/2022):  This is the current hospital active medication list ?Current Facility-Administered Medications  ?Medication Dose Route Frequency Provider Last Rate Last Admin  ? acetaminophen (TYLENOL) tablet 1,000 mg  1,000 mg Oral BID PRN Long, Wonda Olds, MD      ? albuterol (PROVENTIL) (2.5 MG/3ML) 0.083% nebulizer solution  2.5 mg  2.5 mg Nebulization Q6H PRN Long, Wonda Olds, MD      ? busPIRone (BUSPAR) tablet 30 mg  30 mg Oral BID Long, Wonda Olds, MD   30 mg at 01/18/22 0845  ? carvedilol (COREG) tablet 3.125 mg  3.125 mg Oral BID WC Long, Wonda Olds, MD   3.125 mg at 01/18/22 0845  ? donepezil (ARICEPT) tablet 5 mg  5 mg Oral QHS Long, Wonda Olds, MD   5 mg at 01/17/22 2159  ? folic acid (FOLVITE) tablet 1 mg  1 mg Oral Daily Long, Wonda Olds, MD   1 mg at 01/18/22 0845  ? LORazepam (ATIVAN) tablet 1 mg  1 mg Oral Q6H PRN Daleen Bo, MD   1 mg at 01/17/22 1644  ? nicotine (NICODERM CQ - dosed in mg/24 hours) patch 21 mg  21 mg Transdermal Daily Long, Wonda Olds, MD   21 mg at 01/18/22 0850  ? pantoprazole (PROTONIX) EC tablet 40 mg  40 mg Oral Daily Long, Wonda Olds, MD   40 mg at 01/18/22 0845  ? polyethylene glycol (MIRALAX / GLYCOLAX) packet 17 g  17 g Oral Daily PRN Long, Wonda Olds, MD      ? pravastatin (PRAVACHOL) tablet 40 mg  40 mg Oral Daily Long, Wonda Olds, MD   40 mg at 01/18/22 0844  ? QUEtiapine (SEROQUEL XR) 24 hr tablet 200 mg  200 mg Oral QHS Long, Wonda Olds, MD   200 mg at 01/17/22 2159  ? sertraline (ZOLOFT) tablet 200 mg  200 mg Oral Daily Long, Wonda Olds, MD   200 mg at 01/18/22 4132  ? tamsulosin (FLOMAX) capsule 0.4 mg  0.4 mg Oral Daily Long, Wonda Olds, MD   0.4 mg at 01/18/22 0845  ? thiamine tablet 100 mg  100 mg Oral Daily Long, Wonda Olds, MD   100 mg at 01/18/22 0845  ? umeclidinium-vilanterol (ANORO ELLIPTA) 62.5-25 MCG/ACT 1 puff  1 puff Inhalation Daily Long, Wonda Olds, MD      ? ?Current Outpatient Medications  ?Medication Sig Dispense Refill  ? acetaminophen (TYLENOL) 500 MG tablet Take 1,000 mg by mouth 2 (two) times daily as needed for moderate pain.    ? albuterol (PROVENTIL) (2.5 MG/3ML) 0.083% nebulizer solution Take 3 mLs (2.5 mg total) by nebulization every 6 (six) hours as needed for wheezing or shortness of breath. 150 mL 2  ? albuterol (VENTOLIN HFA) 108 (90 Base) MCG/ACT inhaler Inhale 2 puffs into the  lungs every 4 (four) hours as needed for wheezing. 25.5 g 3  ? ANORO ELLIPTA 62.5-25 MCG/ACT AEPB Inhale 1 puff into the lungs daily. 30 each 0  ? aspirin 81 MG chewable tablet Chew 1 tablet (81 mg total) by mouth daily.    ? busPIRone (BUSPAR) 30 MG tablet Take 1 tablet (30 mg total) by mouth 2 (two) times daily. 60 tablet 1  ? carvedilol (COREG) 3.125 MG tablet Take 1 tablet (3.125 mg total) by mouth 2 (two) times daily with a meal.    ? DESCOVY 200-25 MG tablet Take 1 tablet by mouth daily. 90 tablet 0  ? donepezil (ARICEPT) 5 MG tablet  Take 5 mg by mouth at bedtime.    ? folic acid (FOLVITE) 1 MG tablet Take 1 tablet (1 mg total) by mouth daily. 30 tablet 0  ? nicotine (NICODERM CQ - DOSED IN MG/24 HOURS) 21 mg/24hr patch Place 1 patch (21 mg total) onto the skin daily. 28 patch 0  ? pantoprazole (PROTONIX) 40 MG tablet Take 1 tablet (40 mg total) by mouth daily. 30 tablet 0  ? polyethylene glycol (MIRALAX / GLYCOLAX) 17 g packet Take 17 g by mouth daily as needed. (Patient taking differently: Take 17 g by mouth daily as needed for mild constipation.) 14 each 0  ? pravastatin (PRAVACHOL) 40 MG tablet Take 1 tablet (40 mg total) by mouth daily. 90 tablet 1  ? QUEtiapine (SEROQUEL XR) 200 MG 24 hr tablet Take 1 tablet (200 mg total) by mouth at bedtime. 30 tablet 0  ? sertraline (ZOLOFT) 100 MG tablet Take 2 tablets (200 mg total) by mouth daily. 60 tablet 1  ? tamsulosin (FLOMAX) 0.4 MG CAPS capsule Take 1 capsule (0.4 mg total) by mouth daily. 30 capsule   ? thiamine 100 MG tablet Take 1 tablet (100 mg total) by mouth daily.    ? ? ? ?Discharge Medications: ?Please see discharge summary for a list of discharge medications. ? ?Relevant Imaging Results: ? ?Relevant Lab Results: ? ? ?Additional Information ?SSN:  [id number];  [place] COVID-19 Vaccine 06/02/2020 , 01/01/2020 , 12/05/2019 ? ?Raina Mina, LCSWA ? ? ? ? ?

## 2022-01-18 NOTE — Progress Notes (Signed)
CSW spoke with Dyane Dustman, regional director of [facility], @ [phone] to enquire if [facility] had any locked memory care units in the state. Per Ms. Aida Puffer, they have one, [facility], in [place]. ?CSW sent referral to Ms. Aida Puffer @ [phone] for review ?

## 2022-01-18 NOTE — ED Notes (Signed)
Dinner tray provided for patient at this time. ?

## 2022-01-18 NOTE — ED Notes (Signed)
Patient niece came to visit patient; pt is pleasantly confused but able to be redirected; Family updated on latest dispo information-Monique,RN  ?

## 2022-01-18 NOTE — ED Notes (Addendum)
Pt's bed found to be soiled despite several reminders to use RR throughout shift. RN Caryl Pina found that bed was soiled. Linen was changed, pt bathed.  ?

## 2022-01-18 NOTE — ED Provider Notes (Signed)
Emergency Medicine Observation Re-evaluation Note ? ?[name] is a 61 y.o. male, seen on rounds today.  Pt initially presented to the ED for complaints of Altered Mental Status ?Currently, the patient is eating lunch. ? ?Physical Exam  ?BP 129/81 (BP Location: Left Arm)   Pulse 94   Temp 98.1 ?F (36.7 ?C) (Oral)   Resp 15   Ht 6\' 2"  (1.88 m)   Wt 81.6 kg   SpO2 100%   BMI 23.11 kg/m?  ?Physical Exam ?General: awake, alert ?Lungs: respirations even and unlabored  ?Psych: cooperative  ? ?ED Course / MDM  ?EKG:  ? ?I have reviewed the labs performed to date as well as medications administered while in observation.  Recent changes in the last 24 hours include none. ? ?Plan  ?Current plan is for awaiting SNF- memory care placement. ? [name] is not under involuntary commitment. ? ? ?  ?Tacy Learn, PA-C ?01/18/22 1259 ? ?  ?Isla Pence, MD ?01/18/22 1341 ? ?

## 2022-01-18 NOTE — Progress Notes (Signed)
Updated facilities that have denied patient   ? ? ?[facility] ?[facility] ? ? ? ?

## 2022-01-18 NOTE — ED Notes (Signed)
Patient sitting on side of bed, alert and talking. No complaints of pain at this time, no needs expressed. VSS.  ?

## 2022-01-18 NOTE — Evaluation (Signed)
Occupational Therapy Evaluation ?Patient Details ?Name: [name] ?MRN: [id number] ?DOB: June 28, 1961 ?Today's Date: 01/18/2022 ? ? ?History of Present Illness Pt is a 61 y/o male admitted from SNF secondary to AMS. During admission, wandered out of ED on 4/24 and was found and brought back into ED by EMS. PMH includes ongoing  alcohol abuse, HTN, COPD, paranoid schizophrenia, depression/anxiety, nonischemic cardiomyopathy, tobacco abuse, CAD, CHF, noncompliance with medications, recent small incidental PE in Nov 2022  ? ?Clinical Impression ?  ?Patient admitted for above and presents with problem list below, including impaired cognition, generalized weakness, decreased activity tolerance and impaired balance.  Patient currently requires min assist for transfers and limited in room mobility, up to min assist for ADLs.  He is able to follow simple commands with increased time, but presents with poor attention, recall, awareness and problem solving; disoriented to place, time and situation.  He is a poor historian, but patient reports PTA independent in ADLs with some assist from aide.  At this time, recommend continued OT services acutely and after dc at SNF level to optimize return to PLOF and increase safety.   ?   ? ?Recommendations for follow up therapy are one component of a multi-disciplinary discharge planning process, led by the attending physician.  Recommendations may be updated based on patient status, additional functional criteria and insurance authorization.  ? ?Follow Up Recommendations ? Skilled nursing-short term rehab (<3 hours/day)  ?  ?Assistance Recommended at Discharge Frequent or constant Supervision/Assistance  ?Patient can return home with the following Direct supervision/assist for medications management;Help with stairs or ramp for entrance;Assist for transportation;Direct supervision/assist for financial management;Assistance with cooking/housework;A little help with walking and/or  transfers;A little help with bathing/dressing/bathroom ? ?  ?Functional Status Assessment ? Patient has had a recent decline in their functional status and demonstrates the ability to make significant improvements in function in a reasonable and predictable amount of time.  ?Equipment Recommendations ? Other (comment) (defer)  ?  ?Recommendations for Other Services   ? ? ?  ?Precautions / Restrictions Precautions ?Precautions: Fall ?Restrictions ?Weight Bearing Restrictions: No  ? ?  ? ?Mobility Bed Mobility ?Overal bed mobility: Needs Assistance ?Bed Mobility: Supine to Sit, Sit to Supine ?  ?  ?Supine to sit: Min guard ?Sit to supine: Min guard ?  ?General bed mobility comments: for safety ?  ? ?Transfers ?  ?  ?  ?  ?  ?  ?  ?  ?  ?  ?  ? ?  ?Balance Overall balance assessment: Needs assistance ?Sitting-balance support: Feet supported ?Sitting balance-Leahy Scale: Good ?  ?  ?Standing balance support: Single extremity supported, During functional activity ?Standing balance-Leahy Scale: Poor ?Standing balance comment: relies on UE support, reaching out for bil UE support ?  ?  ?  ?  ?  ?  ?  ?  ?  ?  ?  ?   ? ?ADL either performed or assessed with clinical judgement  ? ?ADL Overall ADL's : Needs assistance/impaired ?  ?  ?Grooming: Minimal assistance;Standing ?  ?  ?  ?  ?  ?Upper Body Dressing : Minimal assistance;Sitting ?  ?Lower Body Dressing: Minimal assistance;Sit to/from stand ?  ?Toilet Transfer: Minimal assistance;Ambulation ?  ?  ?  ?  ?  ?Functional mobility during ADLs: Minimal assistance;Cueing for safety;Cueing for sequencing ?General ADL Comments: pt limited by impaired balance, decreased activity tolerance, weakness, cognition  ? ? ? ?Vision   ?Vision Assessment?: No apparent  visual deficits  ?   ?Perception   ?  ?Praxis   ?  ? ?Pertinent Vitals/Pain Pain Assessment ?Pain Assessment: No/denies pain  ? ? ? ?Hand Dominance   ?  ?Extremity/Trunk Assessment Upper Extremity Assessment ?Upper Extremity  Assessment: Generalized weakness ?  ?Lower Extremity Assessment ?Lower Extremity Assessment: Defer to PT evaluation ?  ?Cervical / Trunk Assessment ?Cervical / Trunk Assessment: Kyphotic ?  ?Communication Communication ?Communication: No difficulties ?  ?Cognition Arousal/Alertness: Awake/alert ?Behavior During Therapy: Impulsive ?Overall Cognitive Status: Impaired/Different from baseline ?Area of Impairment: Orientation, Attention, Memory, Following commands, Safety/judgement, Awareness, Problem solving ?  ?  ?  ?  ?  ?  ?  ?  ?Orientation Level: Disoriented to, Place, Time, Situation ?Current Attention Level: Focused ?Memory: Decreased recall of precautions, Decreased short-term memory ?Following Commands: Follows one step commands consistently, Follows one step commands with increased time ?Safety/Judgement: Decreased awareness of safety, Decreased awareness of deficits ?Awareness: Intellectual ?Problem Solving: Slow processing, Difficulty sequencing, Requires verbal cues, Decreased initiation, Requires tactile cues ?General Comments: pt reports at home, dec 2018 with poor awareness to deficits.  Perseverating on his aide has "never stolen from him" and requires constant redirection. ?  ?  ?General Comments    ? ?  ?Exercises   ?  ?Shoulder Instructions    ? ? ?Home Living Family/patient expects to be discharged to:: Private residence ?Living Arrangements: Alone ?Available Help at Discharge: Personal care attendant;Available PRN/intermittently ?Type of Home: Apartment ?Home Access: Stairs to enter ?Entrance Stairs-Number of Steps: 5 ?Entrance Stairs-Rails: Right;Left;Can reach both ?Home Layout: Two level ?Alternate Level Stairs-Number of Steps: flight ?Alternate Level Stairs-Rails: Right;Left ?Bathroom Shower/Tub: Tub/shower unit ?  ?Bathroom Toilet: Standard ?  ?  ?Home Equipment: Conservation officer, nature (2 wheels);Rollator (4 wheels);Cane - single point;Wheelchair - manual;Tub bench ?  ?Additional Comments: reports  aide assist, poor historian ?  ? ?  ?Prior Functioning/Environment Prior Level of Function : Independent/Modified Independent;Patient poor historian/Family not available ?  ?  ?  ?  ?  ?  ?Mobility Comments: reports using RW for mobility ?ADLs Comments: reports independent, has caregiver to assists as needed (unclear hx on what he needs assist with) ?  ? ?  ?  ?OT Problem List: Decreased strength;Decreased activity tolerance;Impaired balance (sitting and/or standing);Decreased cognition;Decreased safety awareness;Decreased knowledge of use of DME or AE;Decreased knowledge of precautions ?  ?   ?OT Treatment/Interventions: Self-care/ADL training;Therapeutic exercise;DME and/or AE instruction;Therapeutic activities;Balance training;Patient/family education;Cognitive remediation/compensation  ?  ?OT Goals(Current goals can be found in the care plan section) Acute Rehab OT Goals ?Patient Stated Goal: none stated ?Time For Goal Achievement: 02/01/22 ?Potential to Achieve Goals: Fair  ?OT Frequency: Min 2X/week ?  ? ?Co-evaluation   ?  ?  ?  ?  ? ?  ?AM-PAC OT "6 Clicks" Daily Activity     ?Outcome Measure Help from another person eating meals?: A Little ?Help from another person taking care of personal grooming?: A Little ?Help from another person toileting, which includes using toliet, bedpan, or urinal?: A Lot ?Help from another person bathing (including washing, rinsing, drying)?: A Little ?Help from another person to put on and taking off regular upper body clothing?: A Little ?Help from another person to put on and taking off regular lower body clothing?: A Little ?6 Click Score: 17 ?  ?End of Session Nurse Communication: Mobility status ? ?Activity Tolerance: Patient tolerated treatment well ?Patient left: in bed;with call bell/phone within reach;with nursing/sitter in room ? ?OT Visit Diagnosis:  Other abnormalities of gait and mobility (R26.89);Muscle weakness (generalized) (M62.81);Other symptoms and signs  involving cognitive function  ?              ?Time: 9784-7841 ?OT Time Calculation (min): 9 min ?Charges:  OT General Charges ?$OT Visit: 1 Visit ?OT Evaluation ?$OT Eval Moderate Complexity: 1 Mod ? ?Jolaine Artist, OT ?

## 2022-01-19 LAB — URINALYSIS, ROUTINE W REFLEX MICROSCOPIC
Bacteria, UA: NONE SEEN
Bilirubin Urine: NEGATIVE
Glucose, UA: NEGATIVE mg/dL
Hgb urine dipstick: NEGATIVE
Ketones, ur: NEGATIVE mg/dL
Leukocytes,Ua: NEGATIVE
Nitrite: NEGATIVE
Protein, ur: NEGATIVE mg/dL
Specific Gravity, Urine: 1.02 (ref 1.005–1.030)
pH: 5 (ref 5.0–8.0)

## 2022-01-19 NOTE — ED Provider Notes (Signed)
Emergency Medicine Observation Re-evaluation Note ? ?[name] is a 61 y.o. male, seen on rounds today.  Pt initially presented to the ED for complaints of Altered Mental Status ?Currently, the patient is waiting placement. ? ?Physical Exam  ?BP 117/77 (BP Location: Left Arm)   Pulse 88   Temp 99.2 ?F (37.3 ?C) (Oral)   Resp 18   Ht 6\' 2"  (1.88 m)   Wt 81.6 kg   SpO2 100%   BMI 23.11 kg/m?  ?Physical Exam ?Neurological:  ?   Mental Status: He is alert.  ? ? ?ED Course / MDM  ?EKG:  ? ?I have reviewed the labs performed to date as well as medications administered while in observation.  Recent changes in the last 24 hours include nothing. ? ?Plan  ?Current plan is for placement. ? [name] is not under involuntary commitment. ? ? ?  ?Ronnie Sites, DO ?01/19/22 0820 ? ?

## 2022-01-19 NOTE — ED Notes (Signed)
Breakfast order placed ?

## 2022-01-19 NOTE — ED Notes (Signed)
Pt walked out of room and attempting to enter other pts rooms, redirected pt back to pts room, pt denies any needs at this time  ?

## 2022-01-19 NOTE — ED Notes (Signed)
Safety sitter at bedside 

## 2022-01-19 NOTE — ED Notes (Signed)
Patient is very restless and continues to come out of room attempting to go home; Patient is pleasantly redirectable; Patient  given another dose of PRN medication-Monique,RN  ?

## 2022-01-19 NOTE — ED Notes (Signed)
Called staffing to request safety sitter, staffing said they don't have any available. Notified charge RN ?

## 2022-01-19 NOTE — Progress Notes (Signed)
Update on placement denials  ? ? ?[employer] ?[facility]  ? ? ?CSW left a message with Dyane Dustman, regional director of [facility], @ [phone] to follow up with referral to [facility] in [place] ?

## 2022-01-20 NOTE — Progress Notes (Signed)
CSW left a message with Dyane Dustman, regional director of [facility], @ [phone] to follow up with referral to [facility] in [facility] ?

## 2022-01-20 NOTE — ED Provider Notes (Signed)
?  Physical Exam  ?BP 123/85 (BP Location: Left Arm)   Pulse (!) 103   Temp 98.9 ?F (37.2 ?C) (Oral)   Resp 18   Ht 6\' 2"  (1.88 m)   Wt 81.6 kg   SpO2 99%   BMI 23.11 kg/m?  ? ?Physical Exam ? ?Procedures  ?Procedures ? ?ED Course / MDM  ?  ?Medical Decision Making ?Amount and/or Complexity of Data Reviewed ?Labs: ordered. ? ?Risk ?OTC drugs. ?Prescription drug management. ? ? ?Patient is pending rehab placement and memory care. ? ? ? ? ?  ?Davonna Belling, MD ?01/20/22 0840 ? ?

## 2022-01-20 NOTE — Progress Notes (Signed)
CSW contacted facilities outside of the [place] area to search for memory care units. Patient will need a facility that takes Medicaid. Family does not have the money to pay privately. Please see progress below for facilities in [place], [place], [place], [place], [place], [place], [place], [place], [place], [place], [place] ? ?[place] Pay ?[place] Pay ?[facility]- Sometimes takes [place]. Referral sent for review  ?[place] Pay ?[facility] [place] Pay ?[facility] [employer] Pay ?[facility] [place] Pay ?[facility]- Private Pay ?[facility] [place] Pay ?[facility] [place] ?[place] Pay  ?[place] Pay ?[place] Pay ?[facility]- Private Pay ?[facility]- Private Pay ?[facility]- Takes medicaid- No memory care beds  ?[facility]- Private Pay ?[facility] - Private Pay ?[facility] Private Pay ?[facility] [place]. No [facility] care unit  ?[facility] [place] Pay ?[facility] [employer] pay  ?[facility]-Private Pay ?[facility] [place]- Private Pay  ?[facility] [employer]. No male beds at this time. [phone] ?26. [facility]- Private pay  ?27. [facility] [employer] ?28. [facility] [place] Pay  ?29. [facility] [place] Pay  ?30. [facility] [place]  ?31. [place]. Referral sent for review ? ?

## 2022-01-20 NOTE — Progress Notes (Signed)
Physical Therapy Treatment ?Patient Details ?Name: [name] ?MRN: [id number] ?DOB: 04-06-1961 ?Today's Date: 01/20/2022 ? ? ?History of Present Illness Pt is a 61 y/o male admitted from SNF secondary to AMS. During admission, wandered out of ED on 4/24 and was found and brought back into ED by EMS. PMH includes ongoing  alcohol abuse, HTN, COPD, paranoid schizophrenia, depression/anxiety, nonischemic cardiomyopathy, tobacco abuse, CAD, CHF, noncompliance with medications, recent small incidental PE in Nov 2022 ? ?  ?PT Comments  ? ? Pt was seen for progression of mobility on the short hall of his unit, more a large room space.  Pt is slow to follow corrections, able to walk but has need for minor assistance to get to memory level of care.  Follow along with him to maintain and increase mobility, to work on AD if needed but also to challenge his balance as he is able to follow instructions.  Acute PT goals are still appropriate for this pt.   ?Recommendations for follow up therapy are one component of a multi-disciplinary discharge planning process, led by the attending physician.  Recommendations may be updated based on patient status, additional functional criteria and insurance authorization. ? ?Follow Up Recommendations ? Skilled nursing-short term rehab (<3 hours/day) ?  ?  ?Assistance Recommended at Discharge Frequent or constant Supervision/Assistance  ?Patient can return home with the following A little help with walking and/or transfers;A little help with bathing/dressing/bathroom;Assistance with cooking/housework;Direct supervision/assist for medications management;Direct supervision/assist for financial management;Assist for transportation;Help with stairs or ramp for entrance ?  ?Equipment Recommendations ? None recommended by PT  ?  ?Recommendations for Other Services   ? ? ?  ?Precautions / Restrictions Precautions ?Precautions: Fall ?Precaution Comments: orthostatic ?Restrictions ?Weight Bearing  Restrictions: No  ?  ? ?Mobility ? Bed Mobility ?Overal bed mobility: Needs Assistance ?Bed Mobility: Supine to Sit, Sit to Supine ?  ?  ?Supine to sit: Min guard ?Sit to supine: Min guard ?  ?General bed mobility comments: pt is trying to sit into bed by going over rail, requires reminders for safety ?  ? ?Transfers ?Overall transfer level: Needs assistance ?Equipment used: 1 person hand held assist ?Transfers: Sit to/from Stand ?Sit to Stand: Min guard ?  ?  ?  ?  ?  ?General transfer comment: min guard for safety, ?  ? ?Ambulation/Gait ?Ambulation/Gait assistance: Min guard, Min assist ?Gait Distance (Feet): 120 Feet ?Assistive device: 1 person hand held assist ?Gait Pattern/deviations: Step-through pattern, Decreased stride length, Wide base of support, Shuffle ?Gait velocity: Decreased ?Gait velocity interpretation: <1.31 ft/sec, indicative of household ambulator ?Pre-gait activities: standing balance ck ?General Gait Details: pt is stable with vitals but unsteady to walk, very distracted and talking about wanting to leave the unit ? ? ?Stairs ?  ?  ?  ?  ?  ? ? ?Wheelchair Mobility ?  ? ?Modified Rankin (Stroke Patients Only) ?  ? ? ?  ?Balance Overall balance assessment: Needs assistance ?Sitting-balance support: Feet supported ?Sitting balance-Leahy Scale: Good ?  ?  ?Standing balance support: Single extremity supported ?Standing balance-Leahy Scale: Fair ?Standing balance comment: less than fair dynamically ?  ?  ?  ?  ?  ?  ?  ?  ?  ?  ?  ?  ? ?  ?Cognition Arousal/Alertness: Awake/alert ?Behavior During Therapy: Impulsive ?Overall Cognitive Status: Impaired/Different from baseline ?Area of Impairment: Awareness, Safety/judgement, Attention, Orientation ?  ?  ?  ?  ?  ?  ?  ?  ?  Orientation Level: Place, Time, Situation ?Current Attention Level: Selective ?Memory: Decreased short-term memory, Decreased recall of precautions ?Following Commands: Follows one step commands with increased  time ?Safety/Judgement: Decreased awareness of safety, Decreased awareness of deficits ?Awareness: Intellectual ?Problem Solving: Slow processing, Difficulty sequencing, Requires verbal cues, Requires tactile cues ?General Comments: Pt is making unsafe choices such as trying to get back to bed by leaning over side of bedrail in up position ?  ?  ? ?  ?Exercises General Exercises - Lower Extremity ?Ankle Circles/Pumps: AROM, AAROM, 5 reps ?Quad Sets: AROM, 10 reps ?Heel Slides: AROM, 10 reps ?Hip ABduction/ADduction: AROM, 10 reps ? ?  ?General Comments General comments (skin integrity, edema, etc.): pt is not attentive with instructions for gait, taking extra time to process the information and this may be a hindrance to using AD ?  ?  ? ?Pertinent Vitals/Pain Pain Assessment ?Pain Assessment: Faces ?Faces Pain Scale: Hurts little more ?Breathing: normal ?Negative Vocalization: none ?Facial Expression: smiling or inexpressive ?Body Language: relaxed ?Consolability: no need to console ?PAINAD Score: 0 ?Pain Location: knees ?Pain Descriptors / Indicators: Guarding ?Pain Intervention(s): Limited activity within patient's tolerance, Monitored during session, Repositioned  ? ? ?Home Living   ?  ?  ?  ?  ?  ?  ?  ?  ?  ?   ?  ?Prior Function    ?  ?  ?   ? ?PT Goals (current goals can now be found in the care plan section) Acute Rehab PT Goals ?Patient Stated Goal: to go home ?Progress towards PT goals: Progressing toward goals ? ?  ?Frequency ? ? ? Min 2X/week ? ? ? ?  ?PT Plan Current plan remains appropriate  ? ? ?Co-evaluation   ?  ?  ?  ?  ? ?  ?AM-PAC PT "6 Clicks" Mobility   ?Outcome Measure ? Help needed turning from your back to your side while in a flat bed without using bedrails?: A Little ?Help needed moving from lying on your back to sitting on the side of a flat bed without using bedrails?: A Little ?Help needed moving to and from a bed to a chair (including a wheelchair)?: A Little ?Help needed standing up  from a chair using your arms (e.g., wheelchair or bedside chair)?: A Little ?Help needed to walk in hospital room?: A Little ?Help needed climbing 3-5 steps with a railing? : Total ?6 Click Score: 16 ? ?  ?End of Session Equipment Utilized During Treatment: Gait belt ?Activity Tolerance: Patient tolerated treatment well ?Patient left: in bed;with call bell/phone within reach ?Nurse Communication: Mobility status ?PT Visit Diagnosis: Unsteadiness on feet (R26.81);Muscle weakness (generalized) (M62.81);Difficulty in walking, not elsewhere classified (R26.2) ?  ? ? ?Time: 0160-1093 (+2355-7322) ?PT Time Calculation (min) (ACUTE ONLY): 10 min ? ?Charges:  $Gait Training: 8-22 mins ?$Therapeutic Exercise: 8-22 mins     ?Ramond Dial ?01/20/2022, 2:25 PM ? ?Mee Hives, PT PhD ?Acute Rehab Dept. Number: [facility] [phone] and [place] [phone] ? ? ?

## 2022-01-21 NOTE — ED Notes (Signed)
Patient reports no complaints at this time and is up at lib in his room.  ?

## 2022-01-21 NOTE — ED Notes (Signed)
This Rn spoke with the main pharmacy who reports that they will send the patient's medication through the tube station ?

## 2022-01-21 NOTE — ED Notes (Signed)
Family at bedside. 

## 2022-01-21 NOTE — ED Notes (Signed)
Charge nurse Meagan made aware that the patient's sitter will not be relieved by another sitter at this time and that the patient will be without a safety sitter from staffing due to shortage in such sitters. Charge nurse verbalizes understanding and reports that she will work on the issue. Patient calm and cooperative however continues to try to come out of the room and wander the unit. Per former sitter patient is known to try and wander out of his appropriate area. This RN to monitor the patient.  ?

## 2022-01-21 NOTE — ED Provider Notes (Signed)
Emergency Medicine Observation Re-evaluation Note ? ?MYLZ YUAN is a 61 y.o. male, seen on rounds today.  Pt initially presented to the ED for complaints of Altered Mental Status ?Currently, the patient is walking around the room, pleasant. ? ?Physical Exam  ?BP (!) 132/92 (BP Location: Right Arm)   Pulse 100   Temp 97.8 ?F (36.6 ?C) (Oral)   Resp 16   Ht 6\' 2"  (1.88 m)   Wt 81.6 kg   SpO2 97%   BMI 23.11 kg/m?  ?Physical Exam ?General: No acute distress ?Cardiac: Regular rate ?Lungs: Clear without signs of respiratory distress ?Psych: Demented, difficult to follow his conversation but pleasant ? ?ED Course / MDM  ?EKG:  ? ?I have reviewed the labs performed to date as well as medications administered while in observation.  Recent changes in the last 24 hours include he tends to Roman likes to come out of the room but no other acute issues. ? ?Plan  ?Current plan is for currently looking for residents where patient can be handled.  Looking outside of [facility] at this time but will most likely be difficult placement due to financial constraints. ? Richad SKILER OLDEN is not under involuntary commitment. ? ? ?  ?Blanchie Dessert, MD ?01/21/22 [phone] ? ?

## 2022-01-21 NOTE — Progress Notes (Signed)
TOC still looking for placement. No bed offers at this time.  ?

## 2022-01-22 MED ORDER — LORAZEPAM 2 MG/ML IJ SOLN: 2.0000 mg | INTRAMUSCULAR | Status: DC | PRN

## 2022-01-22 NOTE — TOC Progression Note (Addendum)
Transition of Care (TOC) - Progression Note  ? ? ?Patient Details  ?Name: WENDY MIKLES ?MRN: [id number] ?Date of Birth: November 21, 1960 ? ?Transition of Care (TOC) CM/SW Contact  ?Verdell Carmine, RN ?Phone Number: ?01/22/2022, 11:33 AM ? ?Clinical Narrative:    ? ?Called and followed up with Lattie [facility] , [phone]. They were purchased by a company and are now doing SNF, called [facility]. They do not have a locked unit. No acceptances in [place], [place] called for follow up on referral [phone]. They do not do admissions on the weekends, left message with Shirlean Mylar  Long for Monday.  ?[facility] in [place] called , no admissions on weekend. Call [place] on Monday [phone] . Information sent via [place] for [employer] and [place] ?  ?  ? ?Expected Discharge Plan and Services ?  ?  ?  ? Memory care unit placement ?  ?                ?  ?  ?  ?  ?  ?  ?  ?  ?  ?  ? ? ?Social Determinants of Health (SDOH) Interventions ?  ? ?Readmission Risk Interventions ?   ? View : No data to display.  ?  ?  ?  ? ? ?

## 2022-01-22 NOTE — ED Provider Notes (Signed)
Emergency Medicine Observation Re-evaluation Note ? ?[name] is a 61 y.o. male, seen on rounds today.  Pt initially presented to the ED for complaints of Altered Mental Status ?Currently, the patient is resting. ? ?Physical Exam  ?BP (!) 148/88 (BP Location: Right Arm)   Pulse 88   Temp 98.1 ?F (36.7 ?C) (Oral)   Resp 16   Ht 6\' 2"  (1.88 m)   Wt 81.6 kg   SpO2 99%   BMI 23.11 kg/m?  ?Physical Exam ?General: resting comfortably, NAD ?Lungs: normal WOB ?Psych: currently calm and resting ?ED Course / MDM  ?EKG:  ? ?I have reviewed the labs performed to date as well as medications administered while in observation.  Recent changes in the last 24 hours include none. ? ?Plan  ?Current plan is for placement per TOC. ? [name] is not under involuntary commitment. ? ? ?  ?Lorelle Gibbs, DO ?01/22/22 1138 ? ?

## 2022-01-22 NOTE — ED Notes (Signed)
Pt wondering around nurses stated. RN attempted to redirect pt back into room. Pt became verbally aggressive and attempted to walk out of area. Security present and redirected pt back to room. RN went to give PRN oral anxiety medication to pt. Pt refused. Pt got up again and attempted to leave. NT was able to redirect pt back into room. Pt now lying in bed watching TV. RN informed EDP regarding pt behavior. MD aware, see new order. ?

## 2022-01-22 NOTE — ED Notes (Signed)
Patient is A&OX1- oriented only to self. Patient continues to wander out of room and is pacing around unit. This RN and sitter have redirected patient and de-escalated the situation with multiple attempts to get patient back into room. Patient continues to be anxious and wander in room.  ?

## 2022-01-23 NOTE — ED Notes (Signed)
Pt pacing in unit and attempting to leave multiple times, wandering around unit appearing anxious. Verbal redirection attempted. Pt given PRN ativan for wandering.  ?

## 2022-01-23 NOTE — ED Notes (Signed)
Breakfast Orders Placed °

## 2022-01-23 NOTE — ED Provider Notes (Signed)
Emergency Medicine Observation Re-evaluation Note ? ?[name] is a 61 y.o. male, seen on rounds today.  Pt initially presented to the ED for complaints of Altered Mental Status ?Currently, the patient is sitting in room. ? ?Physical Exam  ?BP 115/71 (BP Location: Left Arm)   Pulse 75   Temp 98.1 ?F (36.7 ?C) (Oral)   Resp 17   Ht 6\' 2"  (1.88 m)   Wt 81.6 kg   SpO2 99%   BMI 23.11 kg/m?  ?Physical Exam ?General: resting comfortably, NAD ?Lungs: normal WOB ?Psych: currently calm and resting ? ?ED Course / MDM  ?EKG:  ? ?I have reviewed the labs performed to date as well as medications administered while in observation.  Recent changes in the last 24 hours include none. ? ?Plan  ?Current plan is for placement per TOC recommendations. ? [name] is not under involuntary commitment. ? ? ?  ?Lorelle Gibbs, DO ?01/23/22 1125 ? ?

## 2022-01-24 NOTE — Progress Notes (Signed)
Referral faxed to [facility] for memory care placement.  ?

## 2022-01-24 NOTE — ED Notes (Signed)
NT providing shower, new scrubs, and adult diaper application for PT  ?

## 2022-01-24 NOTE — ED Notes (Signed)
Pt is resting on right side with eyes closed. NAD noted, equal and symmetrical RR noted. Care plan continued. ?

## 2022-01-24 NOTE — ED Notes (Signed)
Pt is resting on right side with eyes closed. NAD noted, equal and symmetrical RR noted. Care plan continued ?

## 2022-01-24 NOTE — Progress Notes (Signed)
PT Cancellation Note ? ?Patient Details ?Name: WARD BOISSONNEAULT ?MRN: [id number] ?DOB: 02-Aug-1961 ? ? ?Cancelled Treatment:    Reason Eval/Treat Not Completed: Other (comment) Pt observed ambulating with OT at a supervision level with no LOB and no unsteadiness. No further skilled PT needs at this time. Feel pt would benefit from LTC/memory care long term. Will sign off. If needs change, please re-consult.  ? ?Reuel Derby, PT, DPT  ?Acute Rehabilitation Services  ?Pager: [phone] ?Office: [phone] ? ?[place] ?01/24/2022, 12:54 PM ?

## 2022-01-24 NOTE — Progress Notes (Signed)
Occupational Therapy Treatment ?Patient Details ?Name: [name] ?MRN: [id number] ?DOB: Dec 31, 1960 ?Today's Date: 01/24/2022 ? ? ?History of present illness Pt is a 61 y/o male admitted from SNF secondary to AMS. During admission, wandered out of ED on 4/24 and was found and brought back into ED by EMS. PMH includes ongoing  alcohol abuse, HTN, COPD, paranoid schizophrenia, depression/anxiety, nonischemic cardiomyopathy, tobacco abuse, CAD, CHF, noncompliance with medications, recent small incidental PE in Nov 2022 ?  ?OT comments ? Patient up in hallway with NT, engaged with OT pleasant but confused.  Completing mobility and transfers with supervision, LB dressing and grooming at sink with supervision.  Patient remains disoriented with poor awareness and problem solving, but anticipate this is near his baseline. He follow simple commands, can sequence ADLs, but requires redirection to attend to tasks.  No further acute OT needs identified.  Updated dc recs to long term care or memory care, as pt will need 24/7 supervision due to cognition.  OT to sign off acutely.   ? ?Recommendations for follow up therapy are one component of a multi-disciplinary discharge planning process, led by the attending physician.  Recommendations may be updated based on patient status, additional functional criteria and insurance authorization. ?   ?Follow Up Recommendations ? Long-term institutional care without follow-up therapy (Memory care)  ?  ?Assistance Recommended at Discharge Frequent or constant Supervision/Assistance  ?Patient can return home with the following ? Direct supervision/assist for medications management;Help with stairs or ramp for entrance;Assist for transportation;Direct supervision/assist for financial management;Assistance with cooking/housework;A little help with bathing/dressing/bathroom ?  ?Equipment Recommendations ? None recommended by OT  ?  ?Recommendations for Other Services   ? ?  ?Precautions /  Restrictions Precautions ?Precautions: Fall ?Restrictions ?Weight Bearing Restrictions: No  ? ? ?  ? ?Mobility Bed Mobility ?  ?  ?  ?  ?  ?  ?  ?General bed mobility comments: OOB upon entry ?  ? ?Transfers ?  ?  ?  ?  ?  ?  ?  ?  ?  ?  ?  ?  ?Balance Overall balance assessment: Mild deficits observed, not formally tested ?  ?  ?  ?  ?  ?  ?  ?  ?  ?  ?  ?  ?  ?  ?  ?  ?  ?  ?   ? ?ADL either performed or assessed with clinical judgement  ? ?ADL Overall ADL's : Needs assistance/impaired ?  ?  ?Grooming: Supervision/safety;Standing ?  ?  ?  ?  ?  ?  ?  ?Lower Body Dressing: Supervision/safety;Sit to/from stand ?  ?Toilet Transfer: Supervision/safety;Ambulation ?  ?  ?  ?  ?  ?Functional mobility during ADLs: Supervision/safety ?  ?  ? ?Extremity/Trunk Assessment   ?  ?  ?  ?  ?  ? ?Vision   ?  ?  ?Perception   ?  ?Praxis   ?  ? ?Cognition Arousal/Alertness: Awake/alert ?Behavior During Therapy: Impulsive ?Overall Cognitive Status: Impaired/Different from baseline ?Area of Impairment: Attention, Memory, Following commands, Safety/judgement, Awareness, Problem solving, Orientation ?  ?  ?  ?  ?  ?  ?  ?  ?Orientation Level: Place, Time, Situation ?Current Attention Level: Sustained ?Memory: Decreased recall of precautions, Decreased short-term memory ?Following Commands: Follows one step commands consistently, Follows one step commands with increased time ?Safety/Judgement: Decreased awareness of safety, Decreased awareness of deficits ?Awareness: Intellectual ?Problem Solving: Slow processing, Requires verbal  cues ?General Comments: pt able to sequence and engage in ADL tasks with supervision, limited by recall, problem solving and awareness- unclear baseline ?  ?  ?   ?Exercises   ? ?  ?Shoulder Instructions   ? ? ?  ?General Comments    ? ? ?Pertinent Vitals/ Pain       Pain Assessment ?Pain Assessment: No/denies pain ? ?Home Living   ?  ?  ?  ?  ?  ?  ?  ?  ?  ?  ?  ?  ?  ?  ?  ?  ?  ?  ? ?  ?Prior  Functioning/Environment    ?  ?  ?  ?   ? ?Frequency ? Min 2X/week  ? ? ? ? ?  ?Progress Toward Goals ? ?OT Goals(current goals can now be found in the care plan section) ? Progress towards OT goals: Goals met/education completed, patient discharged from OT ? ?Acute Rehab OT Goals ?Patient Stated Goal: none stated ?OT Goal Formulation: Patient unable to participate in goal setting  ?Plan Discharge plan remains appropriate;Frequency remains appropriate   ? ?Co-evaluation ? ? ?   ?  ?  ?  ?  ? ?  ?AM-PAC OT "6 Clicks" Daily Activity     ?Outcome Measure ? ? Help from another person eating meals?: None ?Help from another person taking care of personal grooming?: A Little ?Help from another person toileting, which includes using toliet, bedpan, or urinal?: A Little ?Help from another person bathing (including washing, rinsing, drying)?: A Little ?Help from another person to put on and taking off regular upper body clothing?: A Little ?Help from another person to put on and taking off regular lower body clothing?: A Little ?6 Click Score: 19 ? ?  ?End of Session   ? ?OT Visit Diagnosis: Other abnormalities of gait and mobility (R26.89);Muscle weakness (generalized) (M62.81);Other symptoms and signs involving cognitive function ?  ?Activity Tolerance Patient tolerated treatment well ?  ?Patient Left Other (comment) (sitting EOB) ?  ?Nurse Communication Mobility status ?  ? ?   ? ?Time: 6812-7517 ?OT Time Calculation (min): 8 min ? ?Charges: OT General Charges ?$OT Visit: 1 Visit ?OT Treatments ?$Self Care/Home Management : 8-22 mins ? ?Jolaine Artist, OT ?Acute Rehabilitation Services ?Pager [phone] ?Office [phone] ? ? ?[place] ?01/24/2022, 12:56 PM ?

## 2022-01-24 NOTE — ED Notes (Signed)
Patient ambulated to the bathroom with a steady gait

## 2022-01-24 NOTE — ED Notes (Signed)
Patient ate 100% drink 480 ?

## 2022-01-24 NOTE — ED Notes (Signed)
Patient provided dinner tray at this time.  ?

## 2022-01-24 NOTE — ED Notes (Signed)
Breakfast Order Placed ?

## 2022-01-24 NOTE — ED Notes (Signed)
Pt is resting on right side with eyes closed. NAD noted, equal and symmetrical RR noted. Care plan continued.  ?

## 2022-01-25 NOTE — ED Notes (Signed)
Placed breakfast order ?

## 2022-01-25 NOTE — ED Notes (Signed)
Close friend/roommate [name] [phone] would like an update asap ?

## 2022-01-26 NOTE — Progress Notes (Signed)
Memory care referrals sent to [facility] care in [place] at [place]  ?

## 2022-01-26 NOTE — Progress Notes (Signed)
Patient was denied memory care placement at [place]. TOC will continue to search for placement  ?

## 2022-01-26 NOTE — ED Notes (Signed)
Breakfast order placed ?

## 2022-01-26 NOTE — ED Provider Notes (Signed)
Emergency Medicine Observation Re-evaluation Note ? ?[name] is a 61 y.o. male, seen on rounds today.  Pt initially presented to the ED for complaints of Altered Mental Status ?Currently, the patient is awake and alert. ? ?Physical Exam  ?BP 120/81 (BP Location: Left Arm)   Pulse 79   Temp 98.5 ?F (36.9 ?C) (Oral)   Resp 18   Ht 6\' 2"  (1.88 m)   Wt 81.6 kg   SpO2 99%   BMI 23.11 kg/m?  ?Physical Exam ?General: awake and alert ?Cardiac: rrr ?Lungs: cta b ?Psych: calm ? ?ED Course / MDM  ?EKG:  ? ?I have reviewed the labs performed to date as well as medications administered while in observation.  Recent changes in the last 24 hours include none. ? ?Plan  ?Current plan is for awaiting memory care placement. ? [name] is not under involuntary commitment. ? ? ?  ?Isla Pence, MD ?01/26/22 [phone] ? ?

## 2022-01-26 NOTE — ED Notes (Signed)
Visitor in room ?

## 2022-01-27 NOTE — ED Notes (Signed)
Sitter at bedside.

## 2022-01-27 NOTE — Progress Notes (Signed)
Efforts to locate placement  ? ? ?[facility]- No beds available  ?[facility] waiting list  ?[name]- Referral sent  ?[place]: 2-4 month wait list for memory care ?[place] no male beds ?[facility]- Referral sent  ?[facility]- Message left with admissions  ?[facility]- Message left with admissions  ?[facility]- No beds ?

## 2022-01-28 NOTE — ED Notes (Signed)
Pt wanders from room periodically, easily redirected back to room. Sitting in a chair in the doorway, interacting with staff at this time ?

## 2022-01-28 NOTE — ED Provider Notes (Signed)
Emergency Medicine Observation Re-evaluation Note ? ?[name] is a 61 y.o. male, seen on rounds today.  Pt initially presented to the ED for complaints of Altered Mental Status ?Currently, the patient is resting comfortable ? ?Physical Exam  ?BP 126/71   Pulse 86   Temp 98.2 ?F (36.8 ?C) (Oral)   Resp 20   Ht 6\' 2"  (1.88 m)   Wt 81.6 kg   SpO2 100%   BMI 23.11 kg/m?  ?Physical Exam ?General: NAD ?Cardiac: Regular HR ?Lungs: No respiratory distress ?Psych: Stable ? ?ED Course / MDM  ?EKG:  ? ? ?Plan  ?Current plan is for memory care placement ? [name] is not under involuntary commitment. ? ? ?  ?Ronnie Dusky, MD ?01/28/22 [phone] ? ?

## 2022-01-28 NOTE — Progress Notes (Signed)
Efforts to locate placement  ? ?1.[facility] left with admissions to follow up with referral that was faxed ?2. [facility]- No beds  ?3. [facility]- Left message  ?4. [facility]- Left Message  ?5. [facility]- No memory care  ?[facility]- Left Message  ?7. [facility]- Left Message  ?8. [facility]- Left Message  ?9. [facility]- Full, no beds at this time  ?10. [facility]- No male beds  ?11. [facility]- Attempted to fax referral to [phone] ?

## 2022-01-29 NOTE — ED Notes (Addendum)
Verified breakfast tray ordered for this patient. ? ?Pt awake intermittently starting at 2am. Easily redirected back to room by staff ?

## 2022-01-29 NOTE — ED Notes (Signed)
Pt resting quietly on hospital bed with eyes closed, resp even and unlabored.  Will hold AM medications at this time, will administer when pt awake. ?

## 2022-01-29 NOTE — ED Notes (Signed)
Report received, care of pt assumed.  Pt sitting in hallway talking with safety sitter.  No concerns voiced at this time.  Will monitor. ?

## 2022-01-30 NOTE — ED Notes (Signed)
Patient having to be constantly redirected to keep him out of other patient's room. ?

## 2022-01-30 NOTE — ED Provider Notes (Signed)
Emergency Medicine Observation Re-evaluation Note ? ?[name] is a 61 y.o. male, seen on rounds today.  Pt initially presented to the ED for complaints of Altered Mental Status ?Currently, the patient is resting. ? ?Physical Exam  ?BP 104/78 (BP Location: Left Arm)   Pulse 99   Temp 98.7 ?F (37.1 ?C) (Oral)   Resp 18   Ht 6\' 2"  (1.88 m)   Wt 81.6 kg   SpO2 99%   BMI 23.11 kg/m?  ?Physical Exam ?General: calm ?C ? ?ED Course / MDM  ?EKG:  ? ?I have reviewed the labs performed to date as well as medications administered while in observation.  Recent changes in the last 24 hours include nothing. ? ?Plan  ?Current plan is for placement. ? [facility] is not under involuntary commitment. ? ? ?  ?Lennice Sites, DO ?01/30/22 1950 ? ?

## 2022-01-30 NOTE — ED Notes (Signed)
Breakfast order placed ?

## 2022-01-30 NOTE — ED Notes (Signed)
Patient sitting next to sitter eating dinner. Very pleasant. ?

## 2022-01-31 NOTE — Progress Notes (Addendum)
Efforts to locate placement  ? ?[facility]- Left Message  ?[facility], [place]- No beds  ?[facility] [place]- No male beds and no openings anytime soon ?[facility]- Left Message  ?[facility] [place]  ?[facility]- No beds. 25 people on the waiting list ?[facility], [facility]- No beds, 6-9 month waiting list  ?[facility]- Only takes their residents  ?[facility] No Beds ?[facility]- Has beds, will send referral to [phone] ?[place] waiting list nothing anytime soon  ?[facility]- 1 open memory care bed, will send referral to [phone] ?[facility]- Left Message  ?[address], [place]- No lockdown unit. Patients cannot wander  ?[place] will review via hub ?[facility]- Referral sent again for review  ?[facility], [place]- Will send to [facility] with admissions for review  ?

## 2022-01-31 NOTE — ED Notes (Signed)
Sitter not available per charge nurse ?

## 2022-01-31 NOTE — ED Notes (Signed)
Breakfast order placed ?

## 2022-01-31 NOTE — ED Provider Notes (Signed)
Emergency Medicine Observation Re-evaluation Note ? ?[name] is a 61 y.o. male, seen on rounds today.  Pt initially presented to the ED for complaints of Altered Mental Status ?Currently, the patient is resting. ? ?Physical Exam  ?BP (!) 158/88 (BP Location: Right Arm)   Pulse 75   Temp 98 ?F (36.7 ?C) (Oral)   Resp 20   Ht 6\' 2"  (1.88 m)   Wt 81.6 kg   SpO2 100%   BMI 23.11 kg/m?  ?Physical Exam ?General: NAD ?Cardiac: well perfused ?Lungs: even and unlabored ?Psych: no agitation ? ?ED Course / MDM  ?EKG:  ? ?I have reviewed the labs performed to date as well as medications administered while in observation.  Recent changes in the last 24 hours include none. The patient has been in the ED for 335 hours waiting on placement, specifically memory care placement. ? ?Plan  ?Current plan is for medically and psychiatrically cleared. Social work coordinating placement ? [facility] is not under involuntary commitment. ? ? ?  ?Matteus Lemming, MD ?01/31/22 0935 ? ?

## 2022-02-01 NOTE — Progress Notes (Signed)
Patient discharged to [facility] in [place] for long term placement. Room 513.  ?

## 2022-02-01 NOTE — Progress Notes (Signed)
CSW received an e-mail stating patient has been accepted at [name] in [place] and admission can be setup 02/01/22. Due to patient not having long-term medicaid a LOG will have to be put in place for 30 days until long term medicaid can be applied for and approved. Patients have to be in a long term facility for 30 days in order to get long term medicaid. CSW will speak with [facility] director about approving a LOG.  ?

## 2022-02-01 NOTE — Progress Notes (Signed)
CSW spoke with admissions Farrel Conners at [facility] in [place]. Tressa Busman stated he did not receive the original referral and told CSW to send it again. Tressa Busman asked if patient was aggressive and combative. CSW stated no but patient does wander. Tressa Busman told CSW to send the referral over for review.  ?

## 2022-02-01 NOTE — ED Notes (Signed)
Family member at bedside.

## 2022-02-01 NOTE — ED Notes (Signed)
Breakfast order placed ?

## 2022-02-01 NOTE — Progress Notes (Signed)
Patient can discharge to [facility] today if LOG is approved. Patients HCPOA, [name] was notified of the plan.  ?

## 2022-02-01 NOTE — Discharge Instructions (Addendum)
Mr. Hodgkin has been in the ER since late April. ? ?He has medical history significant for hypertension, COPD, depression/anxiety, nonischemic cardiomyopathy, alcohol use disorder and likely dementia. ? ?While in the emergency room, his lab work-up has been normal and within normal limits.  His vital signs have stayed stable.  He has been placed on his home medications, and tolerating them well. ? ?Mr. Lajara did not have any delirium tremens while in the emergency room or any severe alcohol withdrawal.  He is well past any complications from alcohol withdrawal at this stage. ? ?He will need continued primary care assessment by the provider at the facility or through his primary care doctor. ?

## 2022-02-01 NOTE — ED Notes (Signed)
Pt requesting tylenol [place] RN made aware. ?

## 2022-02-01 NOTE — ED Provider Notes (Signed)
Emergency Medicine Observation Re-evaluation Note ? ?[name] is a 61 y.o. male, seen on rounds today.  Pt initially presented to the ED for complaints of Altered Mental Status ?Currently, the patient is resting comfortably on her bed. ? ?Physical Exam  ?BP 119/64 (BP Location: Left Arm)   Pulse 73   Temp 97.8 ?F (36.6 ?C) (Oral)   Resp 18   Ht 6\' 2"  (1.88 m)   Wt 81.6 kg   SpO2 100%   BMI 23.11 kg/m?  ?Physical Exam ?General: No acute distress ?Cardiac: Regular rate ?Lungs: No respiratory distress ?Psych: Cooperative ? ?ED Course / MDM  ?EKG:  ? ?I have reviewed the labs performed to date as well as medications administered while in observation.  Recent changes in the last 24 hours include no new changes.  Nursing staff informed me that patient has done well overnight.  Social work is still looking for placement. ? ?Plan  ?Current plan is for patient to await placement.Marland Kitchen ?His vital signs have been stable and within normal limits.  I do not think he needs repeat imaging or labs at this time. ? [facility] is not under involuntary commitment. ? ? ?  ?Varney Biles, MD ?02/01/22 1015 ? ?

## 2022-02-03 ENCOUNTER — Telehealth: Payer: Medicare Other

## 2022-02-03 NOTE — Progress Notes (Incomplete)
? ?Chronic Care Management ?Pharmacy Note ? ?02/03/2022 ?Name:  [name] MRN:  [id number] DOB:  September 03, 1961 ? ?Summary: ?- Patient reports that he feels he is doing well, has no issues or concerns with current medications ?- Has not been checking blood pressures, will be out of entresto after this month, will need refills to last until next cardiology appointment 06/2021 ?- Does not currently have albuterol rescue inhaler, needs refilled - using nebulizer solution if needed at this time  ? ?Recommendations/Changes made from today's visit: ?Start: Saline Nasal Spray - as needed  ?Restart: Albuterol 108 mcg/act inhaler - use 2 puffs every 6 hours as needed for shortness of breath ?Stop: Diphenhydramine  ? ?Subjective: ?[name] is an 61 y.o. year old male who is a primary patient of [name], MD.  The CCM team was consulted for assistance with disease management and care coordination needs.   ? ?Engaged with patient by telephone for follow up visit in response to provider referral for pharmacy case management and/or care coordination services.  ? ?Consent to Services:  ?The patient was given the following information about Chronic Care Management services today, agreed to services, and gave verbal consent: 1. CCM service includes personalized support from designated clinical staff supervised by the primary care provider, including individualized plan of care and coordination with other care providers 2. 24/7 contact phone numbers for assistance for urgent and routine care needs. 3. Service will only be billed when office clinical staff spend 20 minutes or more in a month to coordinate care. 4. Only one practitioner may furnish and bill the service in a calendar month. 5.The patient may stop CCM services at any time (effective at the end of the month) by phone call to the office staff. 6. The patient will be responsible for cost sharing (co-pay) of up to 20% of the service fee (after annual deductible  is met). Patient agreed to services and consent obtained. ? ?Patient Care Team: ?[name], MD as PCP - General (Internal Medicine) ?Jerline Pain, MD as PCP - Cardiology (Cardiology) ?Warden Fillers, MD as Consulting Physician (Ophthalmology) ?Tomasa Blase, [facility] as Pharmacist (Pharmacist) ?Knox Royalty, RN as Case Manager ? ?Recent office visits: ?01/11/2021-  PCP visit - nicotine, meloxicam, and vraylar discontinued  ? ?Recent consult visits: ?02/24/2021 - Dr. Posey Pronto DPM  - ankle sprain - patient given tri-lock ankle brace  ? ?Hospital visits: ?01/17/2022-02/01/2022 [facility] admission - initially presented for altered mental status - boarding in ED while awaiting placement in SNF - had eloped from ED previously - plan is for memory care placement  ?01/13/2022-01/17/2022 - ED to hospital admission - dementia w/ behavioral symptoms - awaiting SNF placement  ?01/03/2022-01/13/2022 - Hospital admission - altered mental status, weakness - discharged to SNF ?10/09/2021 - ED visit  ?08/13/2021 - ED visit - pt left before being seen  ? ?Objective: ? ?Lab Results  ?Component Value Date  ? CREATININE 0.98 01/17/2022  ? BUN 9 01/17/2022  ? GFR 85.64 01/11/2021  ? GFRNONAA >60 01/17/2022  ? GFRAA >60 05/17/2020  ? NA 142 01/17/2022  ? K 3.7 01/17/2022  ? CALCIUM 9.4 01/17/2022  ? CO2 25 01/17/2022  ? GLUCOSE 174 (H) 01/17/2022  ? ? ?Lab Results  ?Component Value Date/Time  ? HGBA1C 6.1 (H) 07/31/2021 03:12 AM  ? HGBA1C 5.8 (A) 07/20/2020 10:12 AM  ? HGBA1C 5.9 (A) 03/03/2020 08:54 AM  ? HGBA1C 6.0 12/04/2019 08:56 AM  ? FRUCTOSAMINE  180 06/23/2009 08:53 PM  ? GFR 85.64 01/11/2021 01:43 PM  ? GFR 96.95 07/20/2020 10:44 AM  ? MICROALBUR 11.8 (H) 12/04/2019 08:56 AM  ? MICROALBUR 0.2 10/08/2008 09:56 AM  ?  ?Last diabetic Eye exam:  ?Lab Results  ?Component Value Date/Time  ? HMDIABEYEEXA No Retinopathy 01/08/2020 12:00 AM  ?  ?Last diabetic Foot exam:  ?Lab Results  ?Component Value Date/Time  ? HMDIABFOOTEX normal  06/04/2012 12:00 AM  ?  ? ?Lab Results  ?Component Value Date  ? CHOL 123 07/20/2020  ? HDL 35.20 (L) 07/20/2020  ? [place] 71 07/20/2020  ? LDLDIRECT 122.0 11/30/2016  ? TRIG 86.0 07/20/2020  ? CHOLHDL 3 07/20/2020  ? ? ? ?  Latest Ref Rng & Units 01/17/2022  ? 10:20 AM 01/13/2022  ?  7:14 PM 01/09/2022  ?  6:53 AM  ?Hepatic Function  ?Total Protein 6.5 - 8.1 g/dL 6.4   6.9   5.9    ?Albumin 3.5 - 5.0 g/dL 3.2   3.6   3.0    ?AST 15 - 41 U/L 47   55   25    ?ALT 0 - 44 U/L 68   68   75    ?Alk Phosphatase 38 - 126 U/L 63   60   48    ?Total Bilirubin 0.3 - 1.2 mg/dL 0.7   0.4   0.7    ? ? ?Lab Results  ?Component Value Date/Time  ? TSH 1.738 01/03/2022 05:58 PM  ? TSH 0.90 06/26/2019 08:36 AM  ? TSH 0.56 11/30/2016 11:37 AM  ? ? ? ?  Latest Ref Rng & Units 01/17/2022  ? 10:20 AM 01/13/2022  ?  7:14 PM 01/09/2022  ?  6:53 AM  ?CBC  ?WBC 4.0 - 10.5 K/uL 13.7   8.6   6.4    ?Hemoglobin 13.0 - 17.0 g/dL 13.8   14.2   13.8    ?Hematocrit 39.0 - 52.0 % 40.8   43.5   41.4    ?Platelets 150 - 400 K/uL 170   169   114    ? ? ?No results found for: VD25OH ? ?Clinical ASCVD: No  ?The ASCVD Risk score (Arnett DK, et al., 2019) failed to calculate for the following reasons: ?  The valid total cholesterol range is 130 to 320 mg/dL   ? ? ?  11/22/2021  ?  3:00 PM 09/24/2021  ?  2:49 PM 08/18/2021  ? 11:30 AM  ?Depression screen PHQ 2/9  ?Decreased Interest 0  1  ?Down, Depressed, Hopeless 1  1  ?PHQ - 2 Score 1  2  ?Altered sleeping   0  ?Tired, decreased energy   1  ?Change in appetite   1  ?Feeling bad or failure about yourself    1  ?Trouble concentrating   1  ?Moving slowly or fidgety/restless   0  ?Suicidal thoughts   0  ?PHQ-9 Score   6  ?Difficult doing work/chores   Somewhat difficult  ?  ? Information is confidential and restricted. Go to Review Flowsheets to unlock data.  ? ? ?Social History  ? ?Tobacco Use  ?Smoking Status Every Day  ? Packs/day: 1.00  ? Years: 38.00  ? Pack years: 38.00  ? Types: Cigarettes  ?Smokeless  Tobacco Never  ?Tobacco Comments  ? one pack cigarettes daily  ? ?BP Readings from Last 3 Encounters:  ?02/01/22 125/90  ?01/17/22 (!) 145/78  ?01/13/22 94/64  ? ?Pulse Readings  from Last 3 Encounters:  ?02/01/22 86  ?01/17/22 77  ?01/13/22 94  ? ?Wt Readings from Last 3 Encounters:  ?01/17/22 180 lb (81.6 kg)  ?01/13/22 180 lb (81.6 kg)  ?01/04/22 180 lb (81.6 kg)  ? ?BMI Readings from Last 3 Encounters:  ?01/17/22 23.11 kg/m?  ?01/13/22 23.11 kg/m?  ?01/04/22 23.11 kg/m?  ? ? ?Assessment/Interventions: Review of patient past medical history, allergies, medications, health status, including review of consultants reports, laboratory and other test data, was performed as part of comprehensive evaluation and provision of chronic care management services.  ? ?SDOH:  (Social Determinants of Health) assessments and interventions performed: Yes ? ? ?SDOH Screenings  ? ?Alcohol Screen: Not on file  ?Depression (PHQ2-9): Low Risk   ? PHQ-2 Score: 1  ?Financial Resource Strain: Not on file  ?Food Insecurity: No Food Insecurity  ? Worried About Charity fundraiser in the Last Year: Never true  ? Ran Out of Food in the Last Year: Never true  ?Housing: Low Risk   ? Last Housing Risk Score: 0  ?Physical Activity: Not on file  ?Social Connections: Not on file  ?Stress: Not on file  ?Tobacco Use: High Risk  ? Smoking Tobacco Use: Every Day  ? Smokeless Tobacco Use: Never  ? Passive Exposure: Not on file  ?Transportation Needs: No Transportation Needs  ? Lack of Transportation (Medical): No  ? Lack of Transportation (Non-Medical): No  ? ? ?CCM Care Plan ? ?Allergies  ?Allergen Reactions  ? Ace Inhibitors Cough  ? Aspirin Other (See Comments)  ?  Reaction:  Nose bleeds and GI bleeding   ? Clopidogrel Bisulfate Other (See Comments)  ?  Reaction:  Nose bleeds   ? Crestor [Rosuvastatin Calcium] Other (See Comments)  ?  Reaction:  Leg cramps   ? Metformin And Related Diarrhea  ? Rosuvastatin Cough  ? ? ?Medications Reviewed Today   ? ?  Reviewed by Tollie Pizza, CPhT (Pharmacy Technician) on 01/14/22 at 1143  Med List Status: Complete  ? ?Medication Order Taking? Sig Documenting Provider Last Dose Status Informant  ?acetaminophen (TYLENOL)

## 2022-02-22 ENCOUNTER — Ambulatory Visit (INDEPENDENT_AMBULATORY_CARE_PROVIDER_SITE_OTHER): Payer: Medicare Other | Admitting: *Deleted

## 2022-02-22 DIAGNOSIS — I429 Cardiomyopathy, unspecified: Secondary | ICD-10-CM

## 2022-02-22 DIAGNOSIS — I10 Essential (primary) hypertension: Secondary | ICD-10-CM

## 2022-02-22 DIAGNOSIS — F200 Paranoid schizophrenia: Secondary | ICD-10-CM

## 2022-02-22 NOTE — Chronic Care Management (AMB) (Signed)
Chronic Care Management   CCM RN Visit Note  02/22/2022 Name: [name] MRN: [id number] DOB: 1960/10/10  Subjective: [name] is a 61 y.o. year old male who is a primary care patient of [name], MD. The care management team was consulted for assistance with disease management and care coordination needs.    Collaboration with [facility] in [place], [place]  for  case closure  in response to provider referral for case management and/or care coordination services.   Consent to Services:  The patient was given information about Chronic Care Management services, agreed to services, and gave verbal consent prior to initiation of services.  Please see initial visit note for detailed documentation.  Patient agreed to services and verbal consent obtained.   Assessment: Review of patient past medical history, allergies, medications, health status, including review of consultants reports, laboratory and other test data, was performed as part of comprehensive evaluation and provision of chronic care management services.  CCM Care Plan  Allergies  Allergen Reactions   Ace Inhibitors Cough   Aspirin Other (See Comments)    Reaction:  Nose bleeds and GI bleeding    Clopidogrel Bisulfate Other (See Comments)    Reaction:  Nose bleeds    Crestor [Rosuvastatin Calcium] Other (See Comments)    Reaction:  Leg cramps    Metformin And Related Diarrhea   Rosuvastatin Cough   Outpatient Encounter Medications as of 02/22/2022  Medication Sig   acetaminophen (TYLENOL) 500 MG tablet Take 1,000 mg by mouth 2 (two) times daily as needed for moderate pain.   albuterol (PROVENTIL) (2.5 MG/3ML) 0.083% nebulizer solution Take 3 mLs (2.5 mg total) by nebulization every 6 (six) hours as needed for wheezing or shortness of breath.   albuterol (VENTOLIN HFA) 108 (90 Base) MCG/ACT inhaler Inhale 2 puffs into the lungs every 4 (four) hours as needed for wheezing.   ANORO ELLIPTA  62.5-25 MCG/ACT AEPB Inhale 1 puff into the lungs daily.   aspirin 81 MG chewable tablet Chew 1 tablet (81 mg total) by mouth daily.   busPIRone (BUSPAR) 30 MG tablet Take 1 tablet (30 mg total) by mouth 2 (two) times daily.   carvedilol (COREG) 3.125 MG tablet Take 1 tablet (3.125 mg total) by mouth 2 (two) times daily with a meal.   DESCOVY 200-25 MG tablet Take 1 tablet by mouth daily.   donepezil (ARICEPT) 5 MG tablet Take 5 mg by mouth at bedtime.   folic acid (FOLVITE) 1 MG tablet Take 1 tablet (1 mg total) by mouth daily.   nicotine (NICODERM CQ - DOSED IN MG/24 HOURS) 21 mg/24hr patch Place 1 patch (21 mg total) onto the skin daily.   pantoprazole (PROTONIX) 40 MG tablet Take 1 tablet (40 mg total) by mouth daily.   polyethylene glycol (MIRALAX / GLYCOLAX) 17 g packet Take 17 g by mouth daily as needed. (Patient taking differently: Take 17 g by mouth daily as needed for mild constipation.)   pravastatin (PRAVACHOL) 40 MG tablet Take 1 tablet (40 mg total) by mouth daily.   QUEtiapine (SEROQUEL XR) 200 MG 24 hr tablet Take 1 tablet (200 mg total) by mouth at bedtime.   sertraline (ZOLOFT) 100 MG tablet Take 2 tablets (200 mg total) by mouth daily.   tamsulosin (FLOMAX) 0.4 MG CAPS capsule Take 1 capsule (0.4 mg total) by mouth daily.   thiamine 100 MG tablet Take 1 tablet (100 mg total) by mouth daily.   No  facility-administered encounter medications on file as of 02/22/2022.   Patient Active Problem List   Diagnosis Date Noted   DTs (delirium tremens) ([place]) 01/05/2022   AMS (altered mental status) 01/03/2022   Severe episode of recurrent major depressive disorder, without psychotic features ([place]) 07/31/2021   Pulmonary embolism ([place]) 07/30/2021   Suicidal ideation 07/30/2021   Long toenail 01/11/2021   Aortic atherosclerosis (H[facility])    Generalized anxiety disorder 11/18/2020   Moderate episode of recurrent major depressive disorder ([place]) 11/18/2020   Panic disorder 11/18/2020    Schizophrenia ([place]) 05/18/2020   Thiamine deficiency 12/10/2019   Screening-pulmonary TB 12/04/2019   Irritable bowel syndrome with diarrhea 07/01/2019   Type II diabetes mellitus with manifestations ([place]) 06/26/2019   De Quervain's tenosynovitis, left 04/11/2018   Sleep apnea, primary central 11/30/2016   Insomnia w/ sleep apnea 11/30/2016   Syphili, latent 03/05/2016   Glaucoma suspect of both eyes 11/19/2015   Non-small cell carcinoma of lung, stage 1 ([place]) 10/12/2015   COPD GOLD II if use fev1/VC and still smoking  08/11/2015   High risk homosexual behavior 07/09/2014   PUD (peptic ulcer disease) 01/09/2013   Routine general medical examination at a health care facility 06/04/2012   Paranoid schizophrenia ([place]) 08/01/2011   DJD (degenerative joint disease) of knee 03/15/2011   Obstructive sleep apnea 11/29/2010   ERECTILE DYSFUNCTION, 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/2010   Allergic rhinitis 03/17/2010   Smoking 12/14/2009   HYPERTENSION, BENIGN 10/05/2009   Nonischemic cardiomyopathy ([place]) 10/05/2009   PAD (peripheral artery disease) ([place]) 09/15/2009   Hx of adenomatous colonic polyps 12/03/2008   Hyperlipidemia with target LDL less than 130 06/23/2008   BPH associated with nocturia 06/23/2008   Conditions to be addressed/monitored:  CHF and HTN  Care Plan : RN Care Manager Plan of Care  Updates made by Knox Royalty, RN since 02/22/2022 12:00 AM     Problem: Chronic Disease Management Needs   Priority: High     Long-Range Goal: Ongoing adherence to established plan of care for long term chronic disease management   Start Date: 08/18/2021  Expected End Date: 08/18/2022  Priority: High  Note:   Current Barriers:  Chronic Disease Management support and education needs related to CHF and HTN Recent hospitalization November 4-9, 2022 for mental health needs, incidental COVID positive finding; small pulmonary embolism Mental health needs: well established with psychiatric provider for  counseling services;  Reports intermittent non-adherence to medications: CCM Pharmacy team involved/ updated  RNCM Clinical Goal(s):  Patient will demonstrate ongoing health management independence as evidenced by patient reporting of self-health management independence, adherence to plan of care        through collaboration with RN Care manager, provider, and care team.   Interventions: 1:1 collaboration with primary care provider regarding development and update of comprehensive plan of care as evidenced by provider attestation and co-signature Inter-disciplinary care team collaboration (see longitudinal plan of care) Evaluation of current treatment plan related to  self management and patient's adherence to plan as established by provider Initial assessment completed 08/18/21 02/22/22: upon review of EHR prior to placing scheduled telephone visit this afternoon, noted from review of recent ED visits that patient is now residing in [place], [place]; placed care coordination outreach to [facility] [phone]) and received confirmation that patient is currently residing in their long term memory care unit; CCM RN CM case closure accordingly; will make PCP aware of same     Plan: No further follow up  required: patient is currently residing in [place] memory care unit; case closure accordingly; PCP made aware of CCM RN CM case closure  Oneta Rack, RN, BSN, [facility] RN Care Coordination- [place] [phone]: direct office

## 2022-02-23 DIAGNOSIS — I10 Essential (primary) hypertension: Secondary | ICD-10-CM

## 2022-02-23 DIAGNOSIS — I509 Heart failure, unspecified: Secondary | ICD-10-CM

## 2022-06-05 IMAGING — CT CT CHEST W/ CM
2 of 4 series · 15 of 36 positions shown, 18 images · IV contrast (omnipaque)
Comparison: Most recent CT chest 01/09/2020.  08/21/2015 PET-CT.

CLINICAL DATA: Primary Cancer Type: Lung
TECHNIQUE: Multidetector CT imaging of the chest was performed during
intravenous contrast administration.

CONTRAST:  75mL OMNIPAQUE IOHEXOL 300 MG/ML  SOLN

[Series 2: axial st · axial · 0.71mm/px · z∈[-358,-62]mm · 12 of 176 slices shown, 15 images]
[im 14/176  mediastinal]
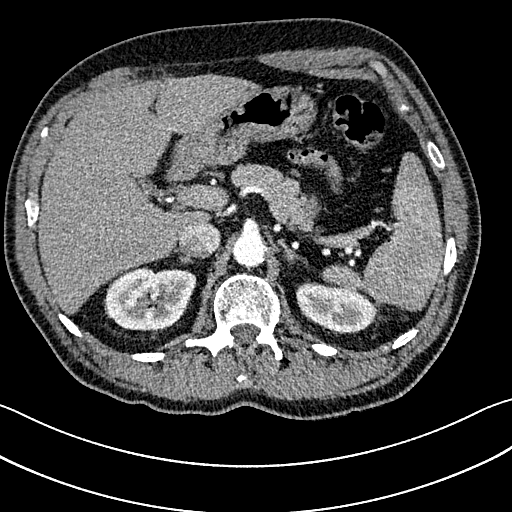
[im 14/176  lung]
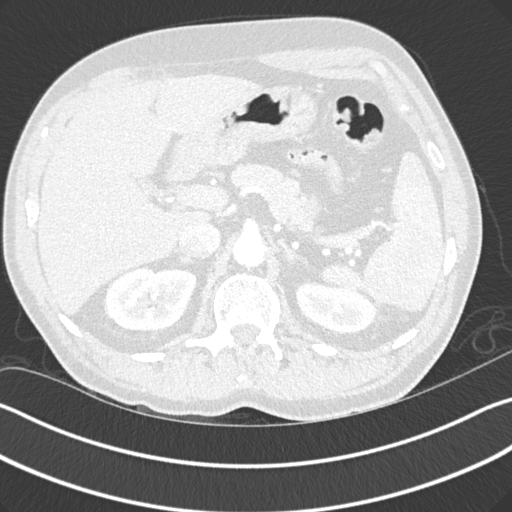
[im 27/176  lung]
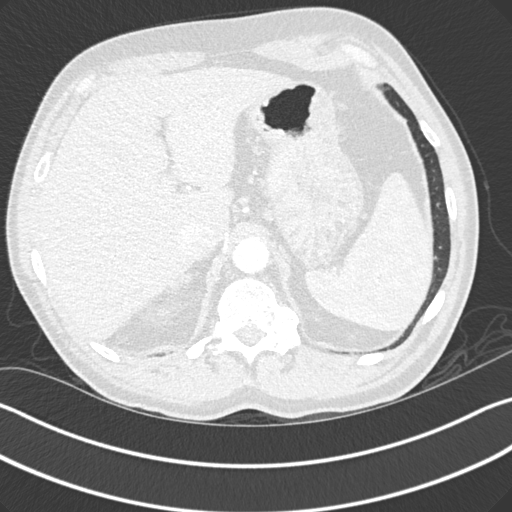
[im 41/176  lung]
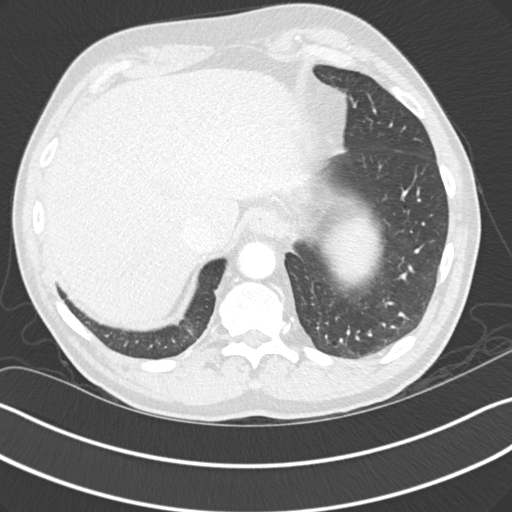
[im 54/176  lung]
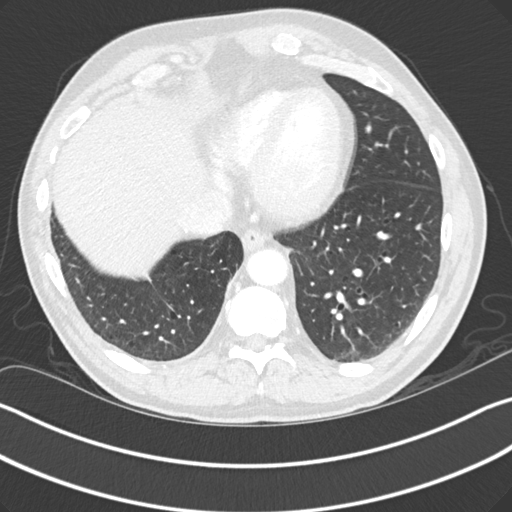
[im 68/176  mediastinal]
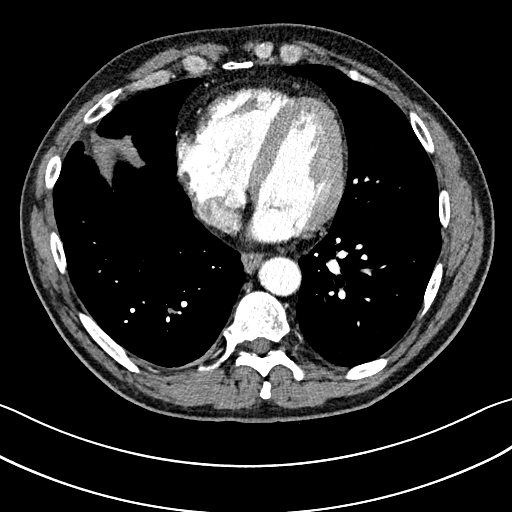
[im 68/176  lung]
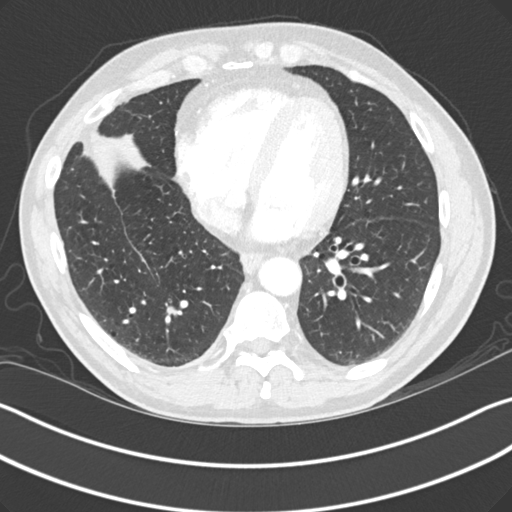
[im 81/176  lung]
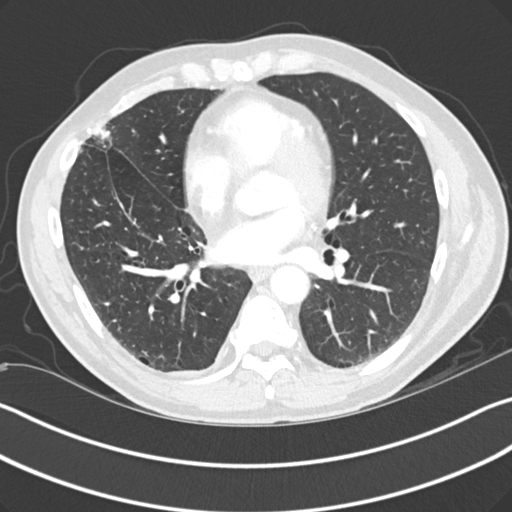
[im 95/176  lung]
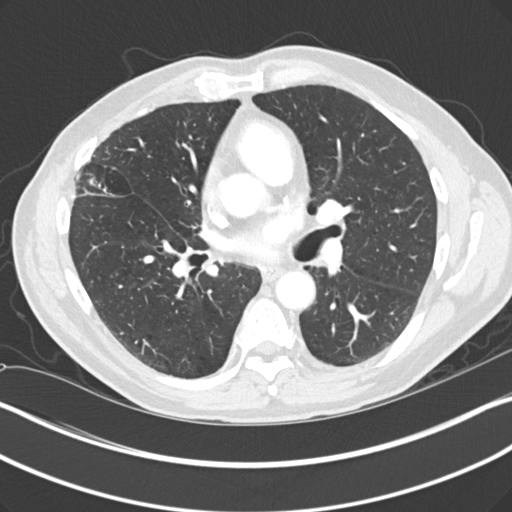
[im 108/176  lung]
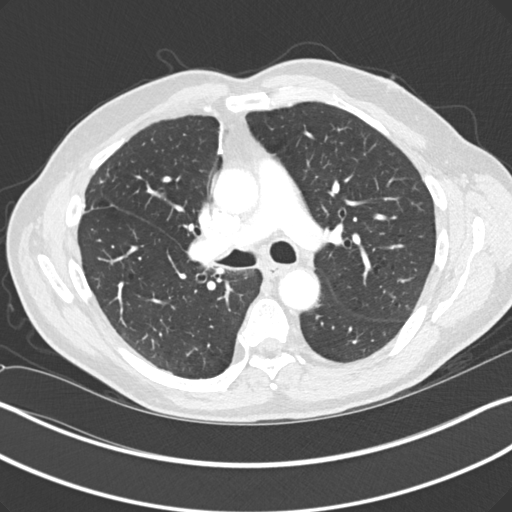
[im 122/176  mediastinal]
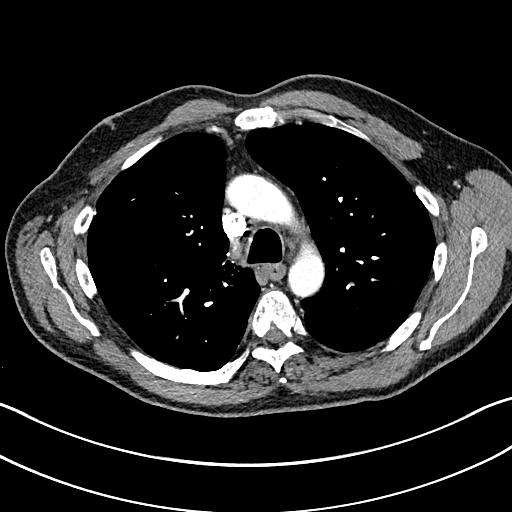
[im 122/176  lung]
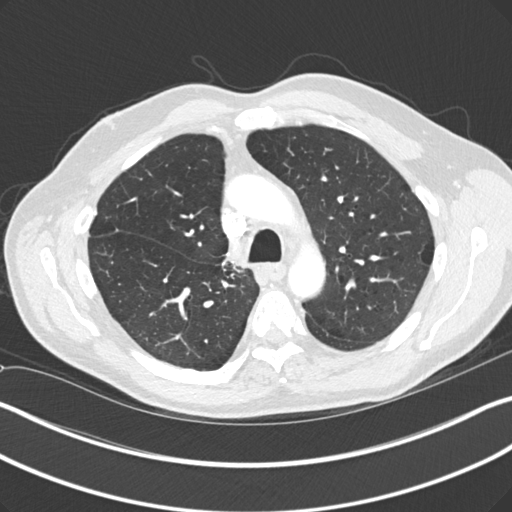
[im 135/176  lung]
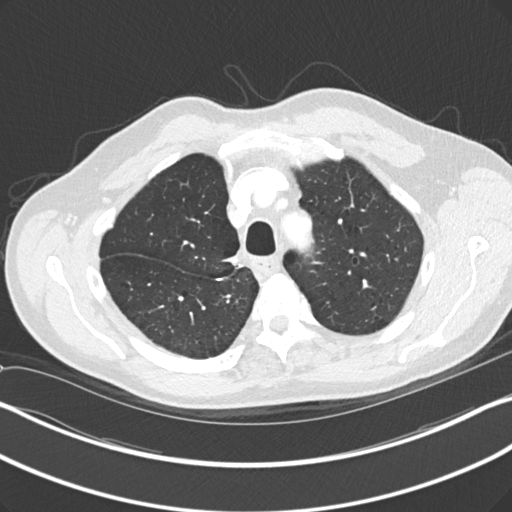
[im 149/176  lung]
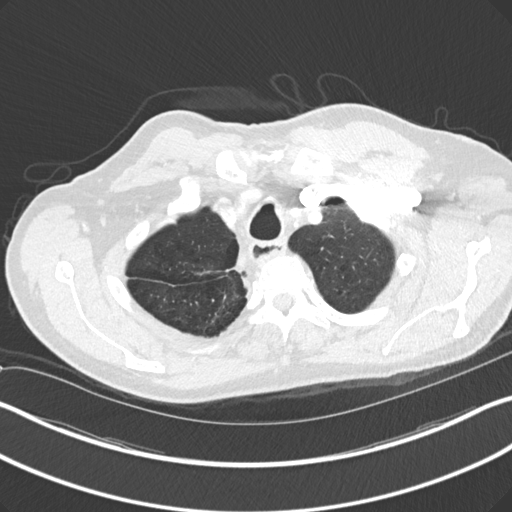
[im 162/176  lung]
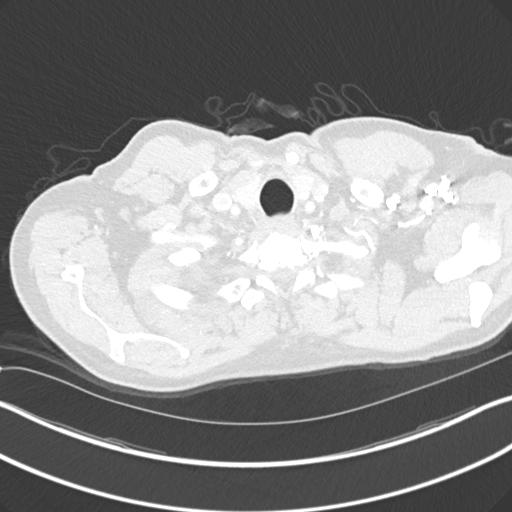

[Series 5: coronal · coronal · 0.72mm/px · 3 of 150 slices shown]
[im 30/150  lung]
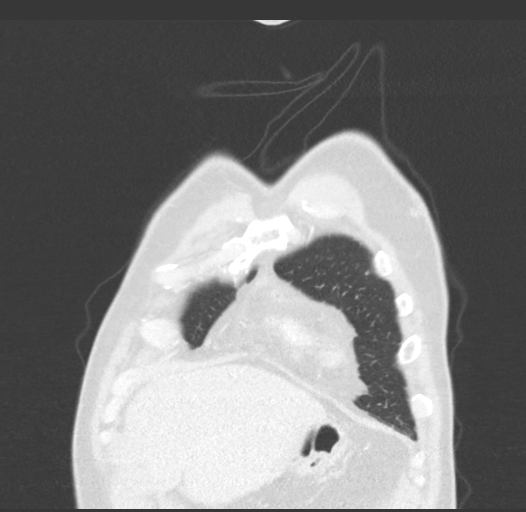
[im 60/150  lung]
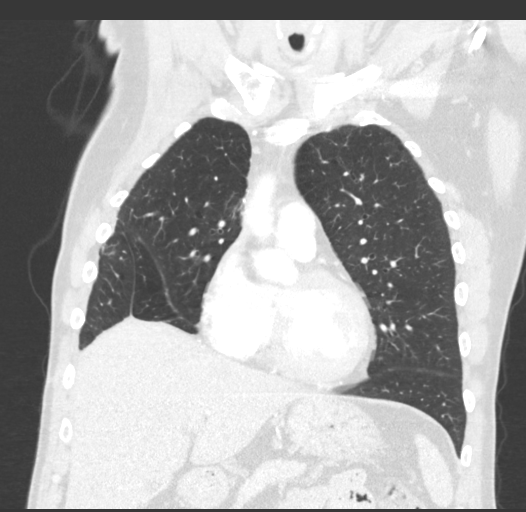
[im 90/150  lung]
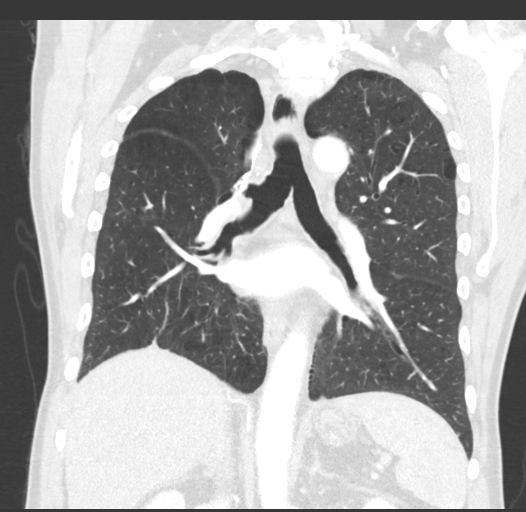

[15 of 36 positions shown; findings below may reference images not displayed]

Imaging Indication: Routine surveillance

Interval therapy since last imaging? No

Initial Cancer Diagnosis

Date: 09/04/2015; Established by: Presumed

Detailed Pathology: Stage IA non-small cell right upper lobe lung
cancer, adenocarcinoma.

Primary Tumor location: Right upper lobe.

Surgeries: Right upper lobectomy 09/14/2015.  Coronary stent.

Chemotherapy: No

Immunotherapy? No

Radiation therapy? No

EXAM:
CT CHEST WITH CONTRAST
FINDINGS: Cardiovascular: Calcified and noncalcified atheromatous plaque of
the thoracic aorta. No aneurysmal dilation. Normal heart size
without pericardial effusion. Normal caliber of the central
pulmonary vasculature.

Mediastinum/Nodes: Thoracic inlet structures without signs of
adenopathy. No axillary lymphadenopathy. No mediastinal adenopathy
with stable appearance of AP window and LEFT paratracheal lymph
nodes compared to previous imaging largest in the realm of 6-7 mm
short axis. No hilar lymphadenopathy with distortion of the RIGHT
hilum following RIGHT upper lobectomy.

Lungs/Pleura: Pulmonary emphysema and RIGHT upper lobectomy. No
consolidation. No pleural effusion. Scarring in the RIGHT mid chest
similar to previous imaging. Material in RIGHT lower lobe bronchi at
the segmental level. No new or suspicious pulmonary nodule.

Upper Abdomen: Incidental imaging of upper abdominal chron select
chron contents without acute process.

Musculoskeletal: No chest wall mass. Spinal degenerative changes
without acute or destructive bone finding. Signs of RIGHT
thoracotomy as before.
IMPRESSION: No new or progressive findings following RIGHT upper lobectomy.

Emphysema, aortic atherosclerosis and vessel coronary artery
calcifications as before.

Aortic Atherosclerosis (E2Y42-B01.1) and Emphysema (E2Y42-T4C.5).

## 2022-07-18 ENCOUNTER — Other Ambulatory Visit: Payer: Self-pay | Admitting: Cardiology

## 2022-08-09 ENCOUNTER — Other Ambulatory Visit: Payer: Self-pay | Admitting: Internal Medicine

## 2022-08-09 DIAGNOSIS — J301 Allergic rhinitis due to pollen: Secondary | ICD-10-CM

## 2022-09-02 ENCOUNTER — Telehealth: Payer: Self-pay

## 2022-09-02 NOTE — Telephone Encounter (Signed)
Transition Care Management Unsuccessful Follow-up Telephone Call  Date of discharge and from where:  [facility] 09/01/22  Attempts:  1st Attempt  Reason for unsuccessful TCM follow-up call:  Unable to reach patient

## 2022-09-06 NOTE — Telephone Encounter (Signed)
Transition Care Management Unsuccessful Follow-up Telephone Call  Date of discharge and from where:  [facility] 09/01/22  Attempts:  2nd Attempt  Reason for unsuccessful TCM follow-up call:  Unable to reach patient    Per Epic pt was admitted on 12/10 for altered mental status via [facility]

## 2022-09-12 ENCOUNTER — Telehealth: Payer: Self-pay

## 2022-09-12 NOTE — Telephone Encounter (Signed)
Per CoverMyMeds: [place] denied.   Medicare allows [place] to cover a drug only when it is a Part D drug. A Part D drug is one that is used for a "medically accepted indication." A medically accepted indication means the use is approved by the Food and Drug Administration (FDA) OR the use is supported by one of the following accepted references: (1) [place]. (2) Micromedex [place]. VRAYLAR CAP 3MG  is not FDA approved or supported by an accepted reference for your medical condition(s): Major depressive disorder. In order to approve your medication, you must also be adjunctive therapy to antidepressants. Therefore, your drug is denied because it is not being used for a "medically accepted indication.

## 2022-09-12 NOTE — Telephone Encounter (Signed)
Key: [id number]

## 2022-10-10 ENCOUNTER — Telehealth: Payer: Self-pay

## 2022-10-10 NOTE — Telephone Encounter (Signed)
Transition Care Management Unsuccessful Follow-up Telephone Call  Date of discharge and from where:  10/09/22 from [facility]  Attempts:  1st Attempt  Reason for unsuccessful TCM follow-up call:  Missing or invalid number  Called 2nd number on the list and left patient a message. AS, CMA

## 2022-10-11 ENCOUNTER — Telehealth: Payer: Self-pay

## 2022-10-11 NOTE — Telephone Encounter (Signed)
Transition Care Management Unsuccessful Follow-up Telephone Call  Date of discharge and from where:  1/14 from [facility]  Attempts:  2nd Attempt  Reason for unsuccessful TCM follow-up call:  Left voice message

## 2022-10-11 NOTE — Telephone Encounter (Signed)
Error

## 2022-10-14 NOTE — Telephone Encounter (Signed)
Transition Care Management Unsuccessful Follow-up Telephone Call  Date of discharge and from where:  [facility] 10/09/22  Attempts:  3rd Attempt  Reason for unsuccessful TCM follow-up call:  Left voice message

## 2022-10-19 IMAGING — DX DG CHEST 1V PORT
1 series · 1 of 1 positions shown · non-contrast
Comparison: CT chest 03/16/2021 and chest radiograph from
07/28/2020

CLINICAL DATA: Sudden onset left-sided chest pain

EXAM:
PORTABLE CHEST 1 VIEW

[chest ap]
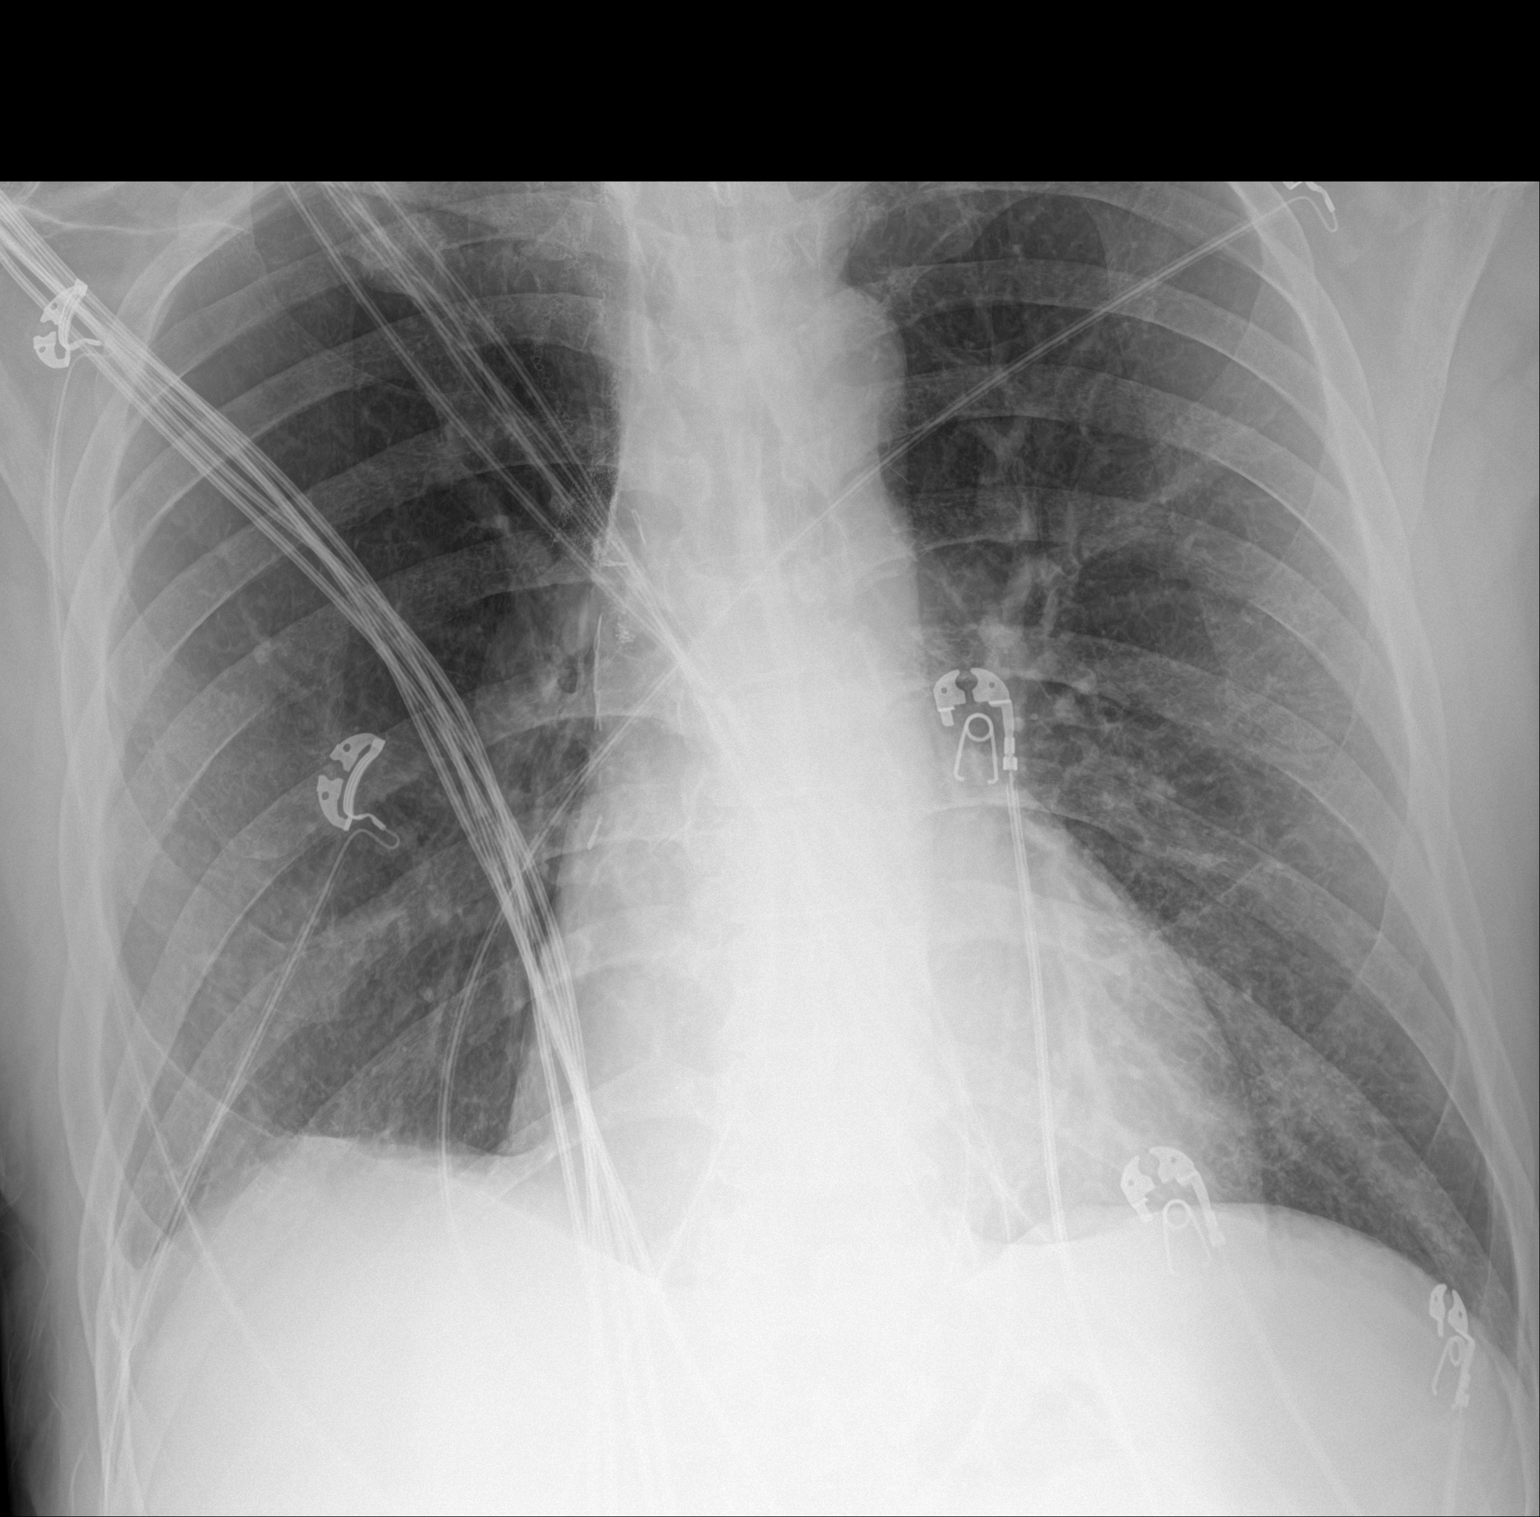

[1 of 1 positions shown; findings below may reference images not displayed]

FINDINGS: Stable scarring along the right hemidiaphragm. Prior right upper
lobectomy. The lungs appear otherwise clear. Cardiac and mediastinal
margins appear normal. Emphysema noted.
IMPRESSION: 1. No acute findings.
2. Emphysema.
3. Prior right upper lobectomy.

## 2022-10-19 IMAGING — CT CT ANGIO CHEST
2 of 7 series · 18 of 46 positions shown · IV contrast (omnipaque)
Comparison: CT of the chest 03/16/2021.

CLINICAL DATA: Positive D-dimer. History of non-small cell lung
cancer in the right upper lobe.

EXAM:
CT ANGIOGRAPHY CHEST WITH CONTRAST
TECHNIQUE: Multidetector CT imaging of the chest was performed using the
standard protocol during bolus administration of intravenous
contrast. Multiplanar CT image reconstructions and MIPs were
obtained to evaluate the vascular anatomy.
CONTRAST:  100mL OMNIPAQUE IOHEXOL 350 MG/ML SOLN

[Series 7: thins · axial · 0.81mm/px · z∈[-197,+119]mm · 15 of 509 slices shown]
[im 29/509  lung]
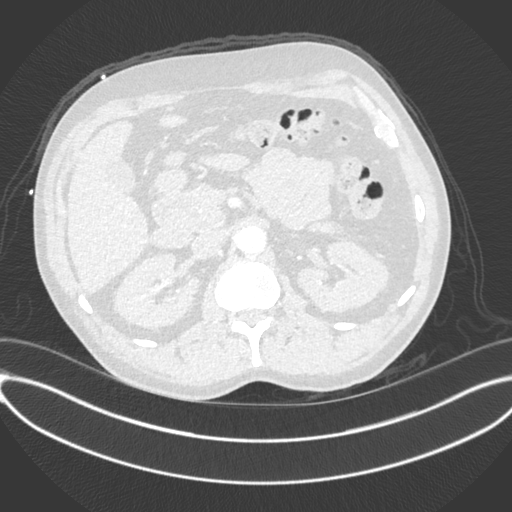
[im 57/509  soft-tissue]
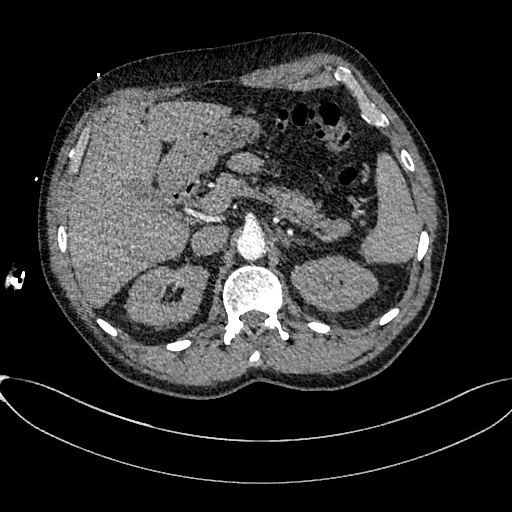
[im 85/509  lung]
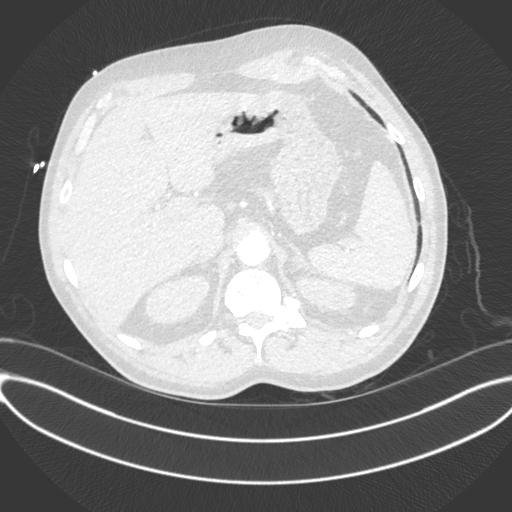
[im 113/509  soft-tissue]
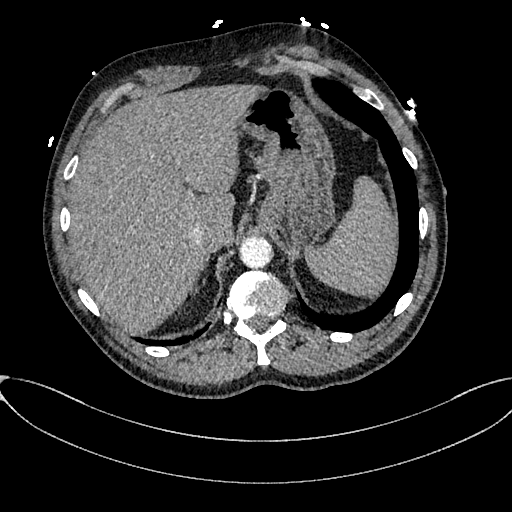
[im 170/509  lung]
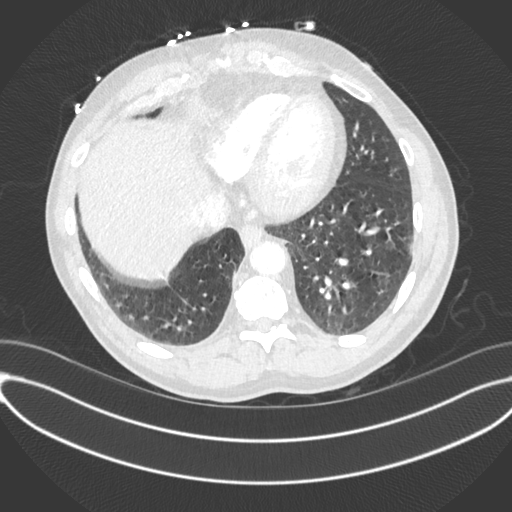
[im 198/509  soft-tissue]
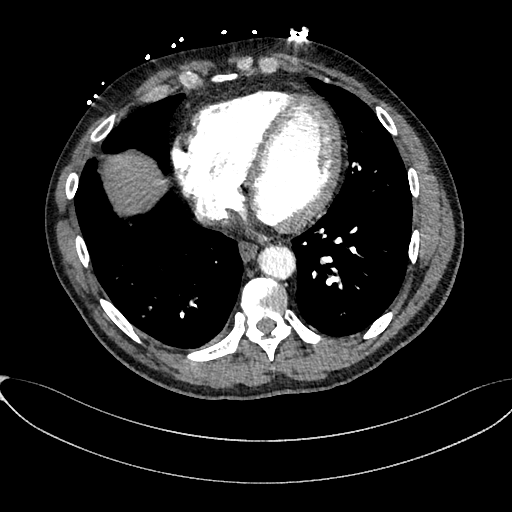
[im 226/509  lung]
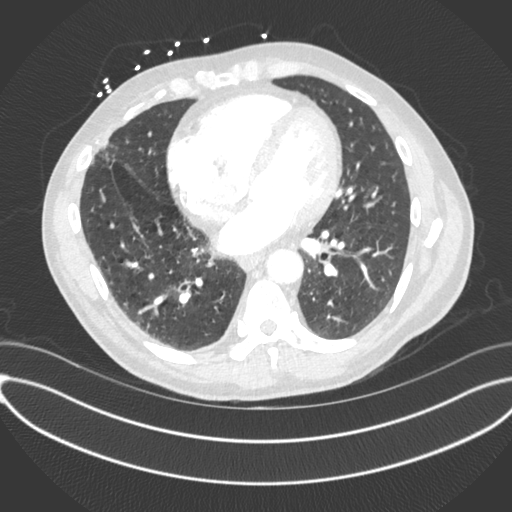
[im 255/509  soft-tissue]
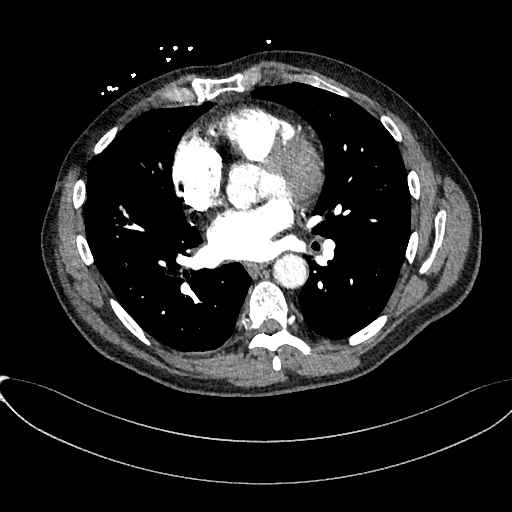
[im 283/509  lung]
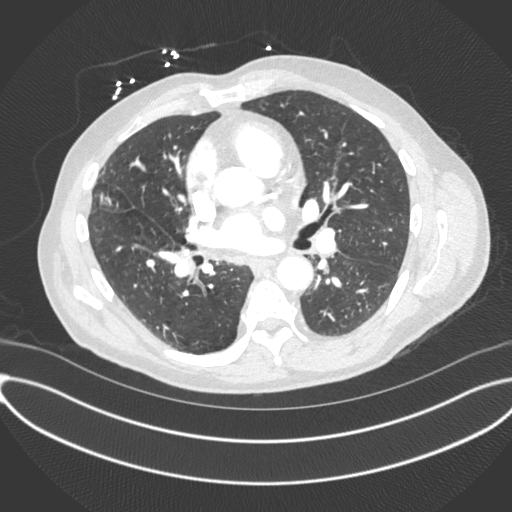
[im 311/509  soft-tissue]
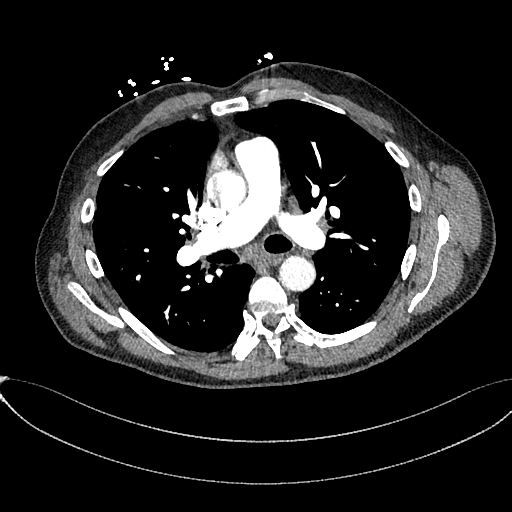
[im 339/509  lung]
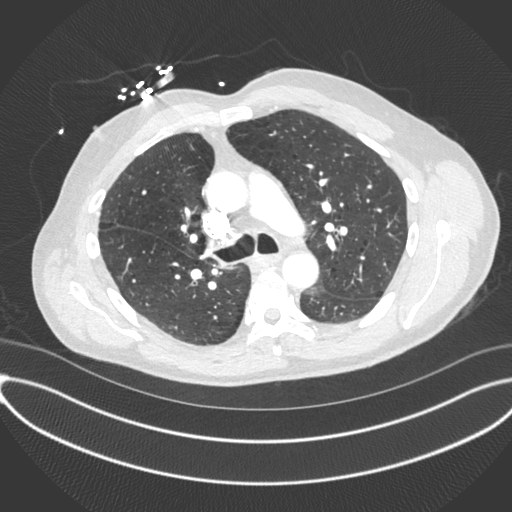
[im 396/509  soft-tissue]
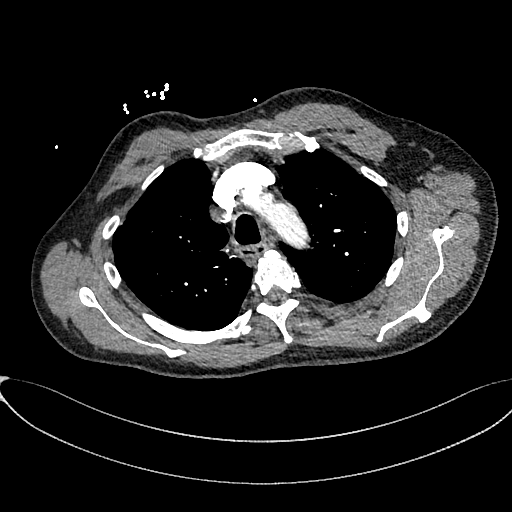
[im 424/509  lung]
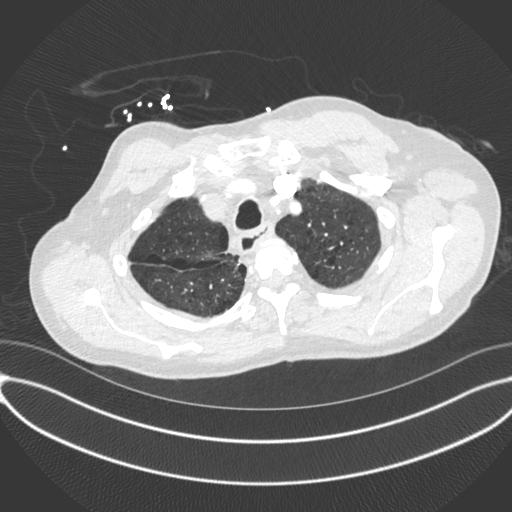
[im 452/509  soft-tissue]
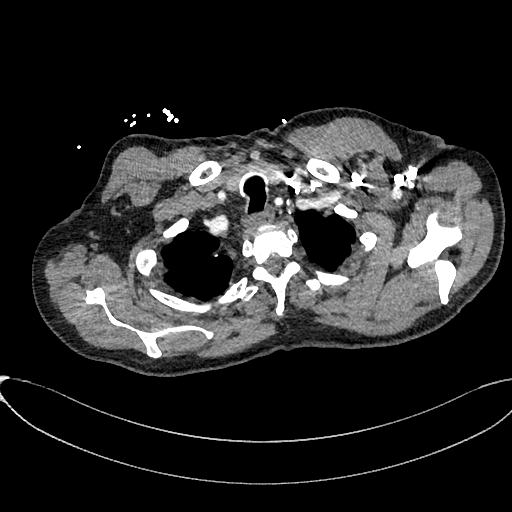
[im 480/509  lung]
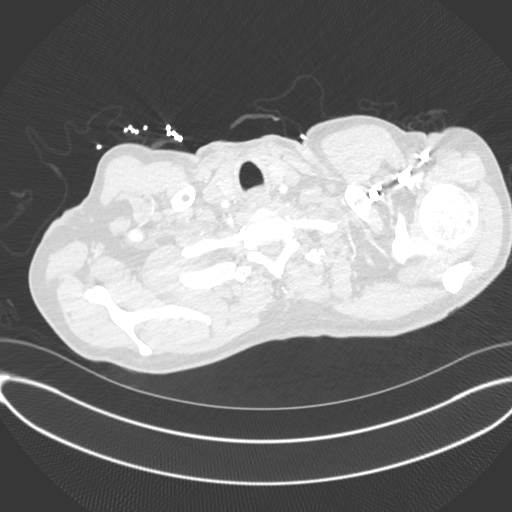

[Series 8: cor · coronal · 0.72mm/px · 3 of 156 slices shown]
[im 39/156  soft-tissue]
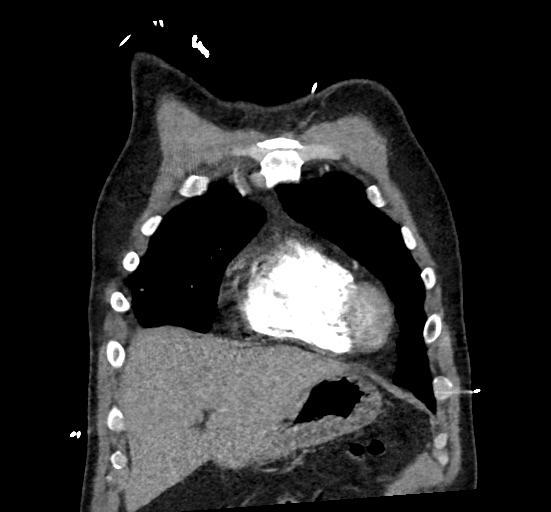
[im 78/156  soft-tissue]
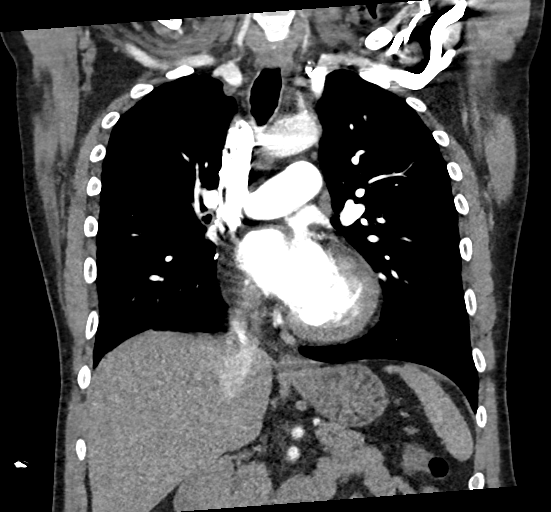
[im 117/156  soft-tissue]
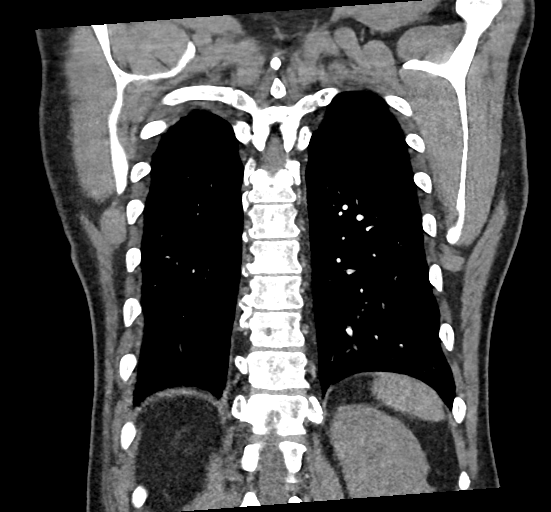

[18 of 46 positions shown; findings below may reference images not displayed]

FINDINGS: Cardiovascular: Opacification of the pulmonary arteries is adequate.
There is a questionable single small pulmonary embolism in a right
lower lobe segmental branch image 5/[REDACTED] and 108. No
other pulmonary emboli are identified. The main pulmonary artery is
nonenlarged. The heart is mildly enlarged. Aorta is normal in size.
There is no pericardial effusion. There are atherosclerotic
calcifications of the coronary arteries.

Mediastinum/Nodes: There is an enlarged subcarinal lymph node
measuring 12 mm short axis. This is new. There are nonenlarged
bilateral hilar and AP window lymph nodes. The esophagus and
visualized thyroid gland are within normal limits. There is a small
hiatal hernia.

Lungs/Pleura: Patient is status post right upper lobectomy. Moderate
emphysematous changes are again seen. Lungs are otherwise clear.
There is no pleural effusion or pneumothorax.

Upper Abdomen: No acute abnormality.

Musculoskeletal: No chest wall abnormality. No acute or significant
osseous findings.

Review of the MIP images confirms the above findings.
IMPRESSION: 1. Questionable single small right lower lobe segmental pulmonary
emboli.
2. Status post right upper lobectomy. No acute cardiopulmonary
process.
3. Mild cardiomegaly.
4. Enlarged subcarinal lymph node, new from prior. This is
indeterminate in this patient with history of cancer.
5.  Emphysema ([id number]).

## 2022-10-20 IMAGING — CT CT HEAD W/O CM
4 series · 16 of 47 positions shown, 18 images · non-contrast
Comparison: Head CT 05/17/2020

CLINICAL DATA: Head trauma with mental status changes.

EXAM:
CT HEAD WITHOUT CONTRAST
TECHNIQUE: Contiguous axial images were obtained from the base of the skull
through the vertex without intravenous contrast.

[Series 2: head without · axial · non-contrast · 0.45mm/px · z∈[-113,+27]mm · 7 of 38 slices shown, 9 images]
[im 5/38  brain]
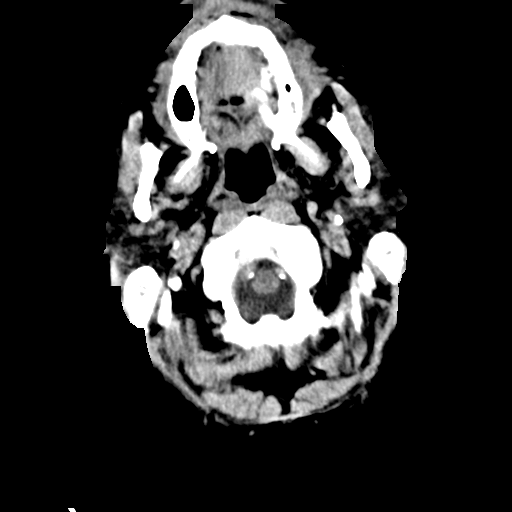
[im 5/38  bone]
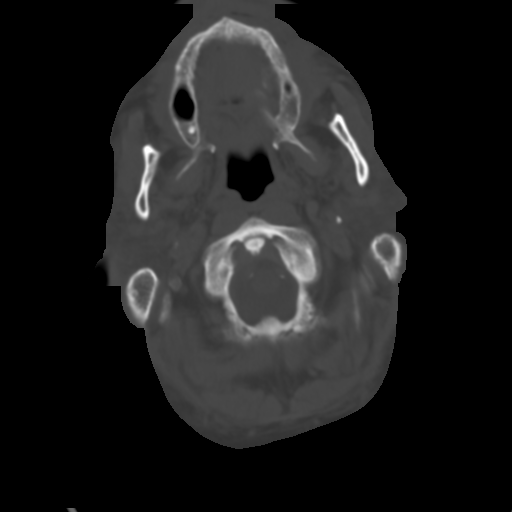
[im 10/38  brain]
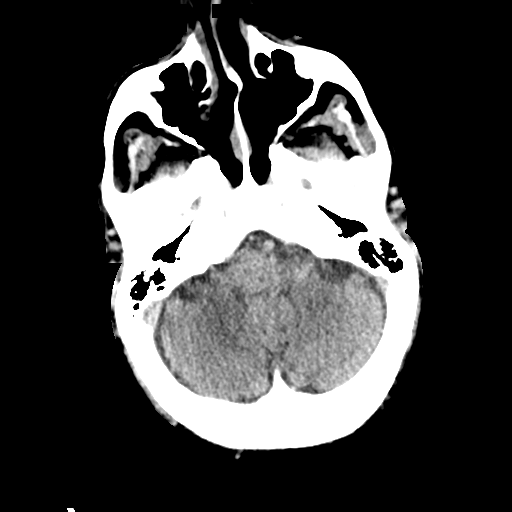
[im 14/38  brain]
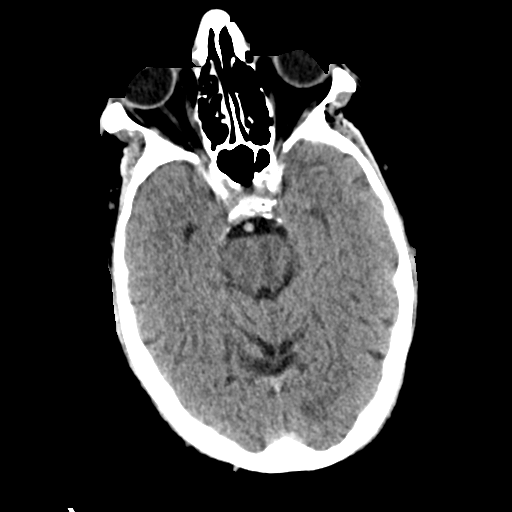
[im 19/38  brain]
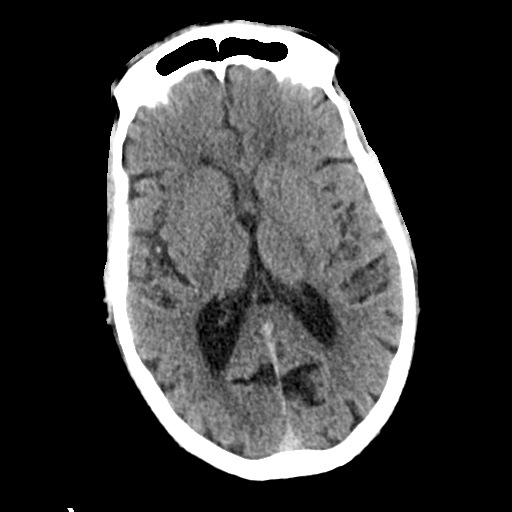
[im 24/38  brain]
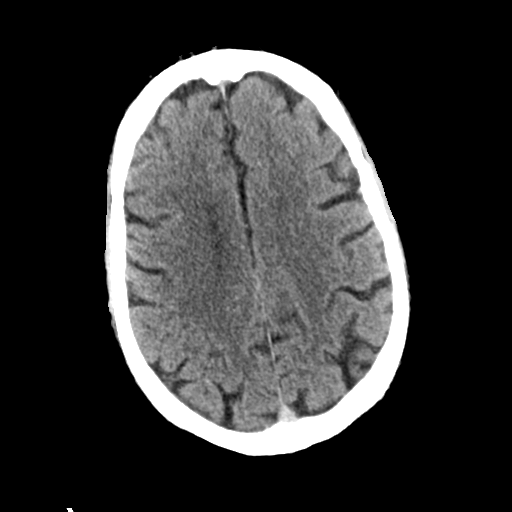
[im 24/38  bone]
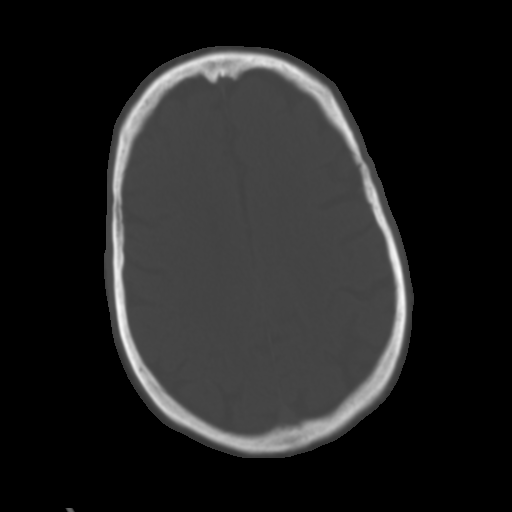
[im 28/38  brain]
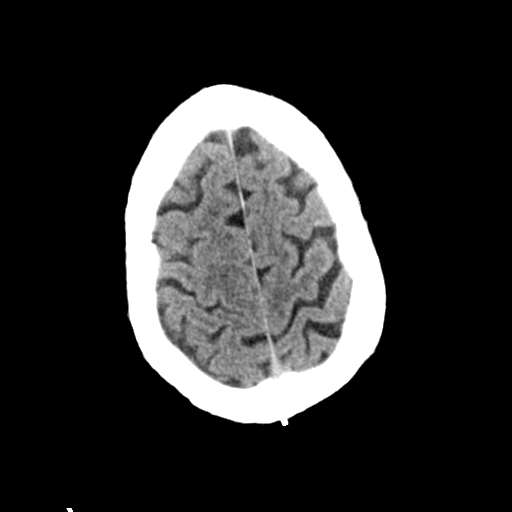
[im 33/38  brain]
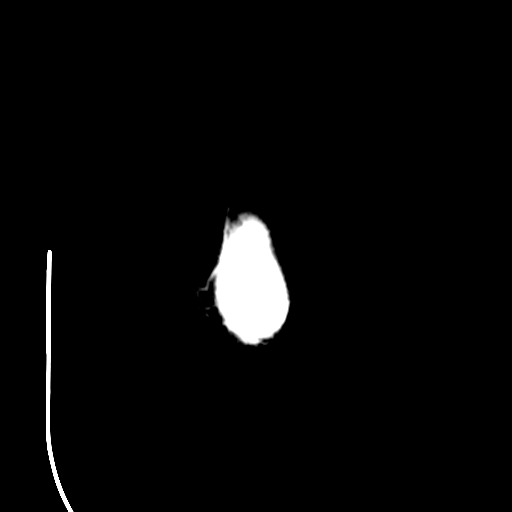

[Series 3: head bone · axial · 0.45mm/px · z∈[-115,-79]mm · 3 of 94 slices shown]
[im 10/94  bone]
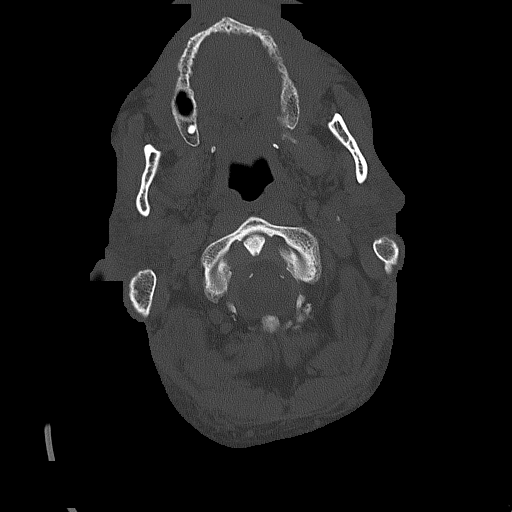
[im 19/94  bone]
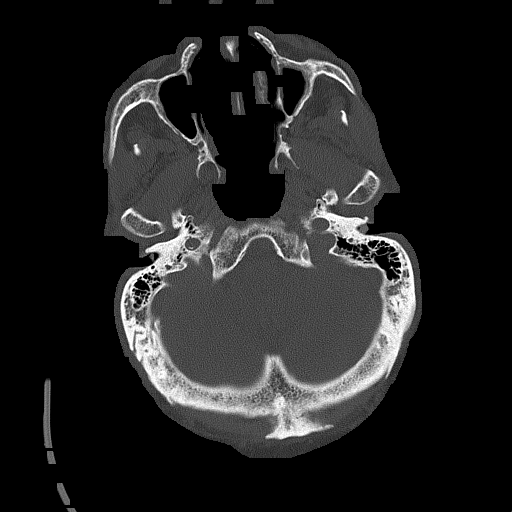
[im 28/94  bone]
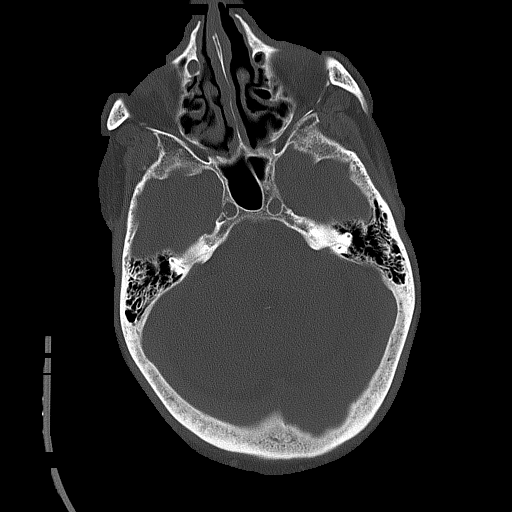

[Series 4: head without cor · coronal · non-contrast · 0.33mm/px · 3 of 78 slices shown]
[im 26/78  brain]
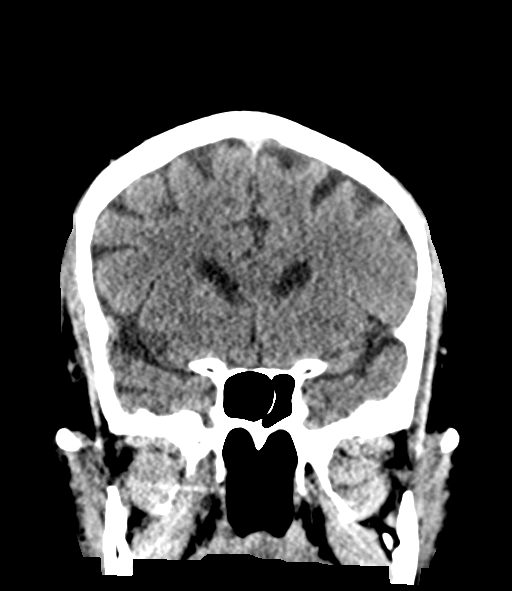
[im 35/78  brain]
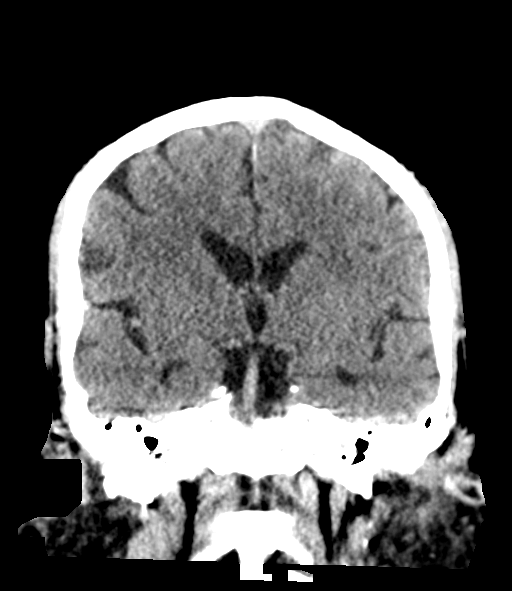
[im 43/78  brain]
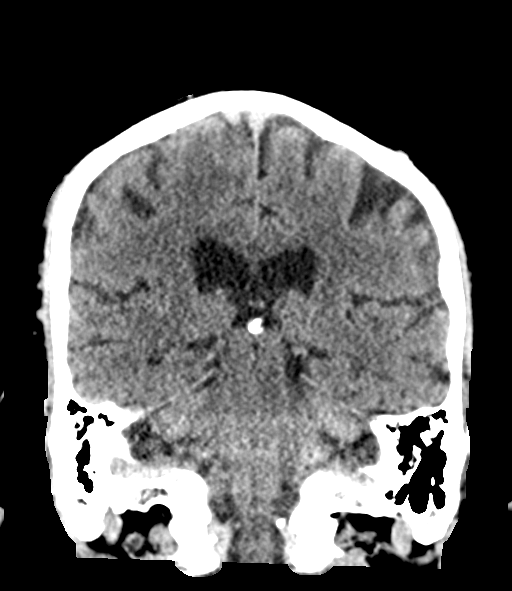

[Series 5: head without sag · sagittal · non-contrast · 0.37mm/px · 3 of 67 slices shown]
[im 23/67  brain]
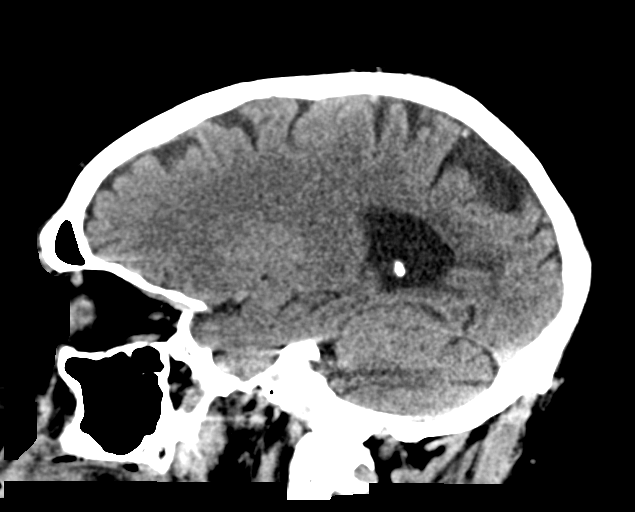
[im 34/67  brain]
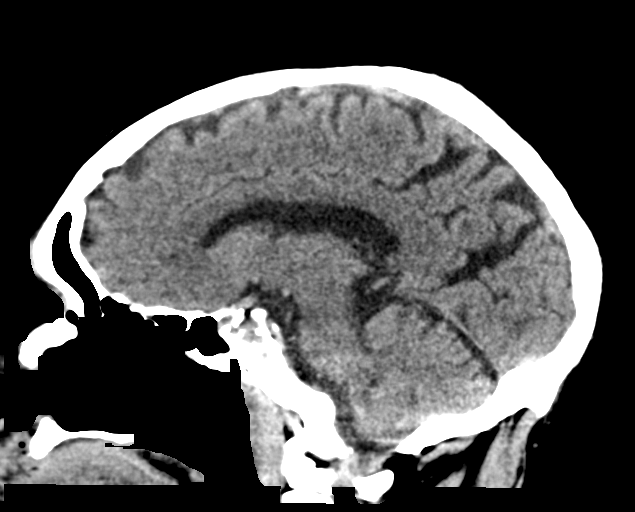
[im 45/67  brain]
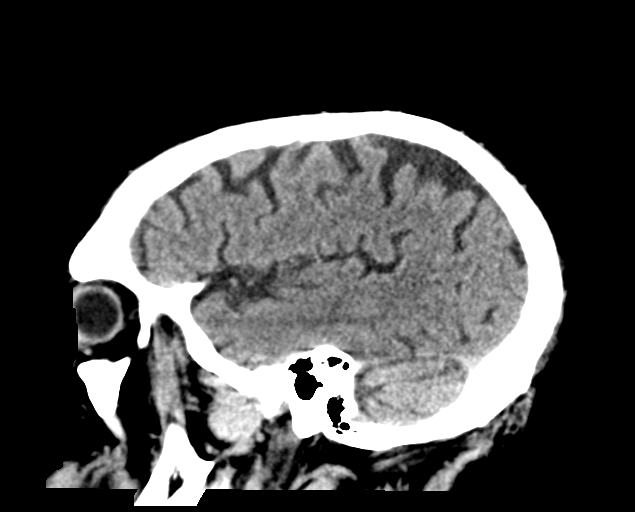

[16 of 47 positions shown; findings below may reference images not displayed]

FINDINGS: Brain: There is mild cerebral cerebellar atrophy with mild small
vessel disease in the cerebral white matter. The ventricles are
normal in size and position. No asymmetry is seen concerning for an
acute infarct, hemorrhage or mass.

Vascular: There calcifications of the distal vertebral arteries and
siphons but no hyperdense central vessels

Skull: There is no visible scalp hematoma. The calvarium, skull base
and orbits are intact

Sinuses/Orbits: There is mild membrane thickening in the paranasal
sinuses without fluid levels. The mastoid air cells and middle ears
are clear. The nasal septum is S-shaped.

Other: None.
IMPRESSION: No acute intracranial CT findings, depressed skull fractures, or
interval changes.

## 2022-11-02 IMAGING — DX DG CHEST 2V
2 series · 2 of 2 positions shown · non-contrast
Comparison: 07/30/2021

CLINICAL DATA: Chest pain and shortness of breath, history of prior
COVID infection

EXAM:
CHEST - 2 VIEW

[chest lat]
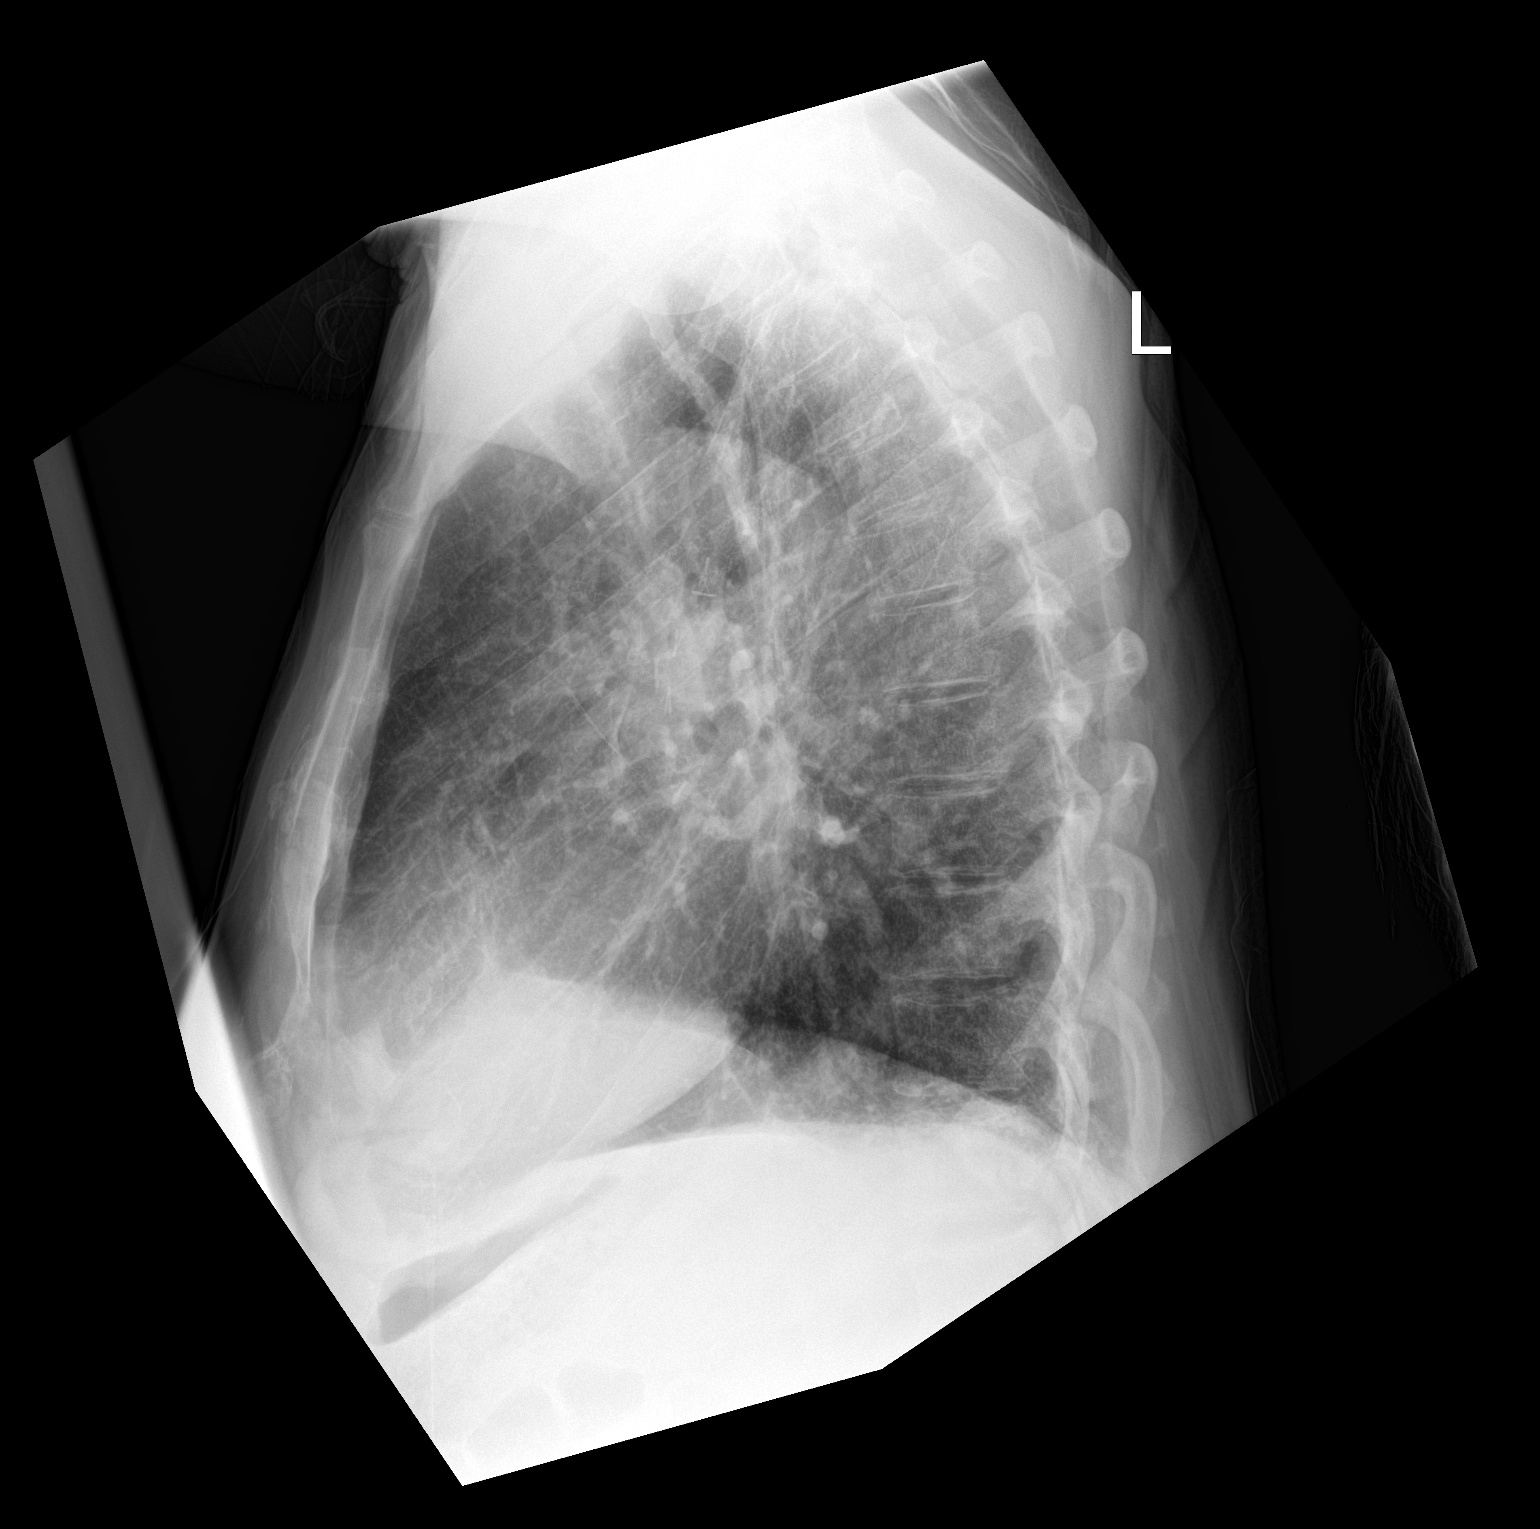

[chest ap]
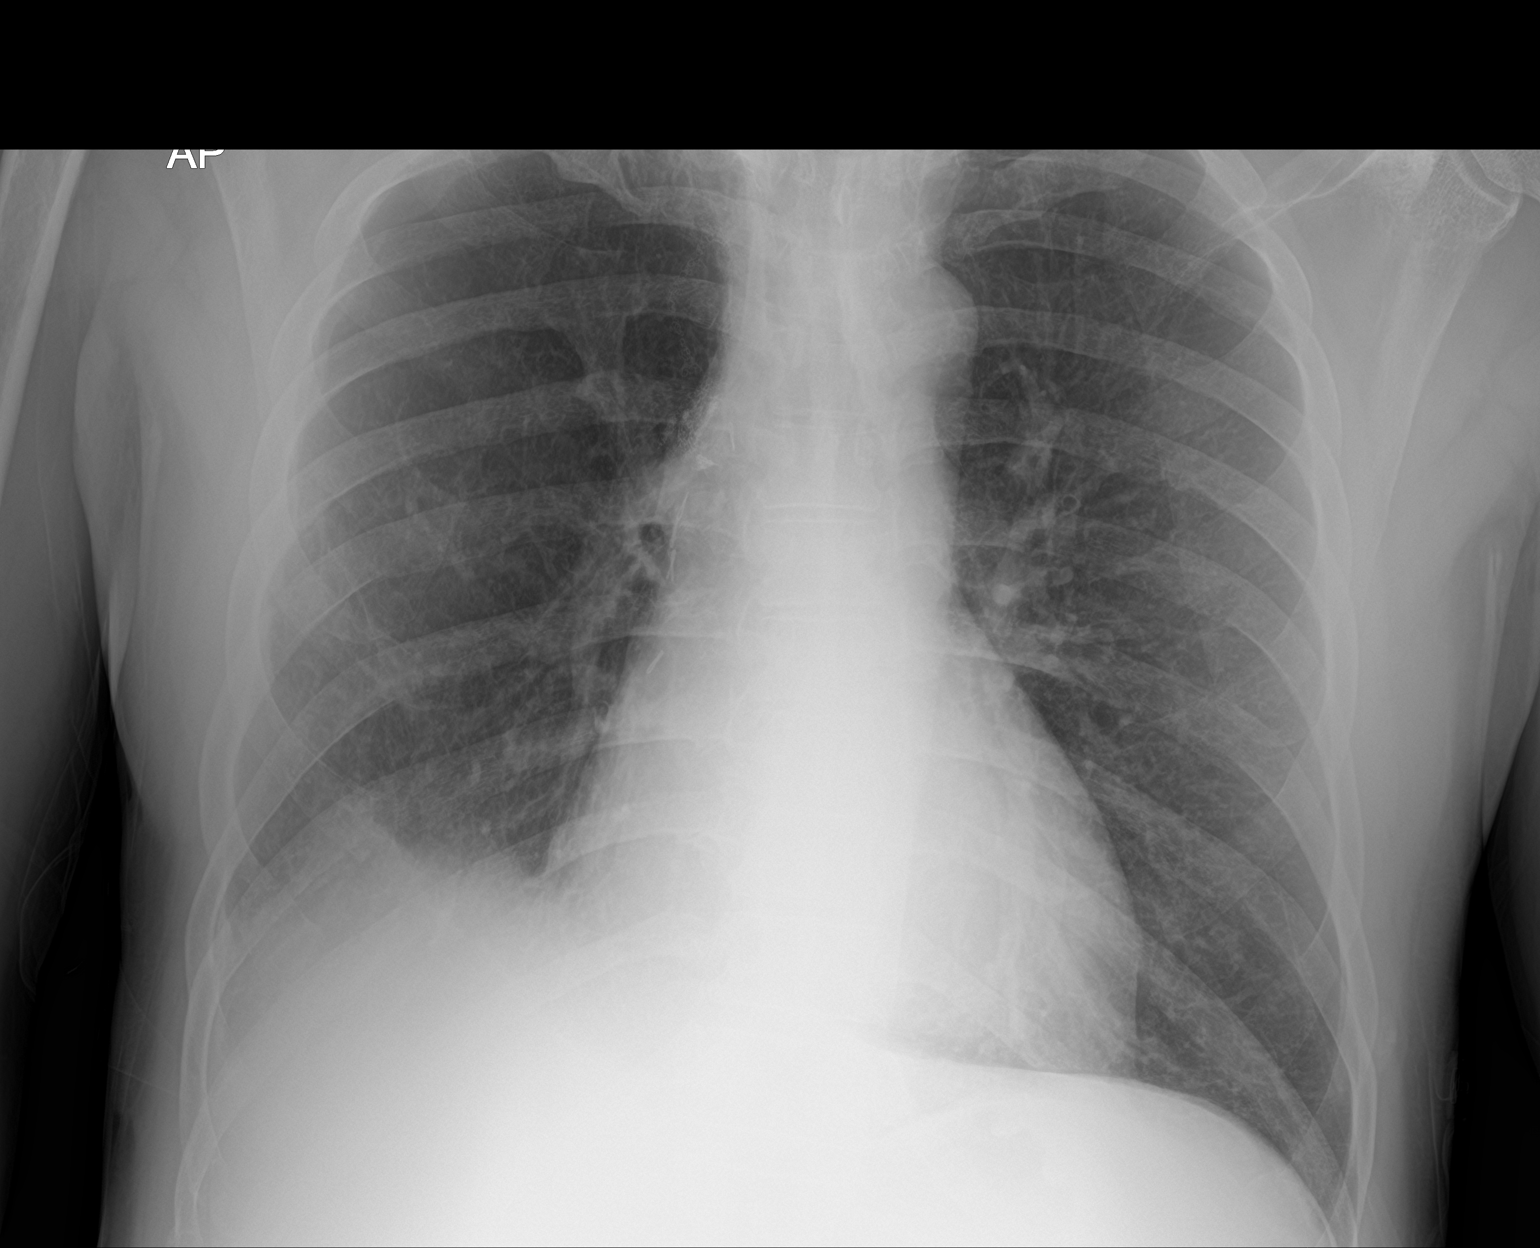

[2 of 2 positions shown; findings below may reference images not displayed]

FINDINGS: Cardiac shadow is within normal limits. Lungs are well aerated
bilaterally. Postsurgical changes are noted in the right hilar
region consistent with prior upper lobectomy. No focal infiltrate is
seen. No bony abnormality is noted.
IMPRESSION: No active cardiopulmonary disease.

## 2023-02-15 ENCOUNTER — Other Ambulatory Visit: Payer: Self-pay

## 2023-03-25 IMAGING — CT CT HEAD W/O CM
2 series · 15 of 30 positions shown, 17 images · non-contrast
Comparison: 07/31/2021

CLINICAL DATA: Neurological deficit



[Series 3: head wo · axial · 0.37mm/px · z∈[-244,-127]mm · 7 of 34 slices shown, 9 images]
[im 5/34  brain]
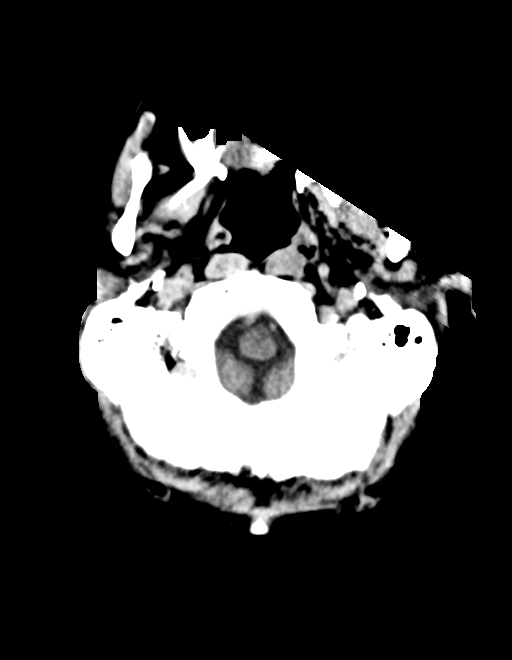
[im 5/34  bone]
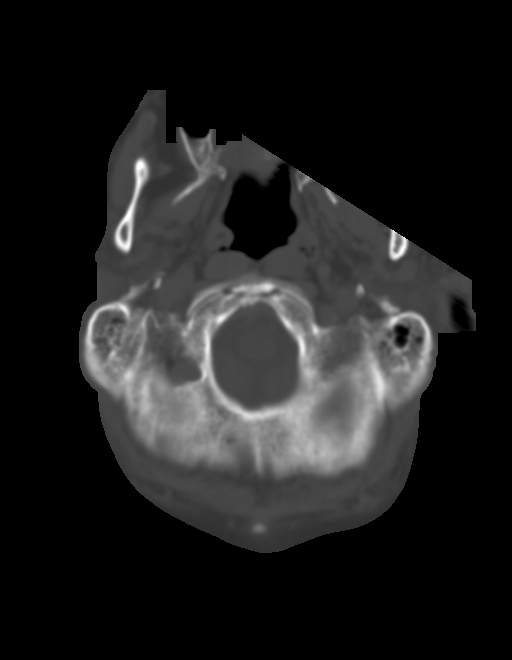
[im 9/34  brain]
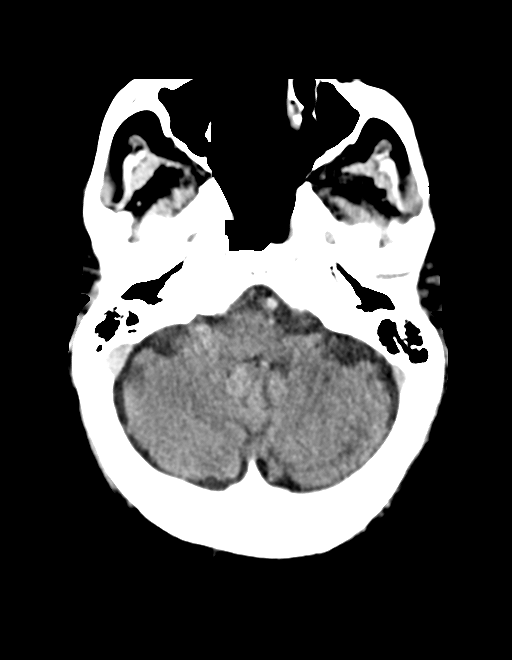
[im 13/34  brain]
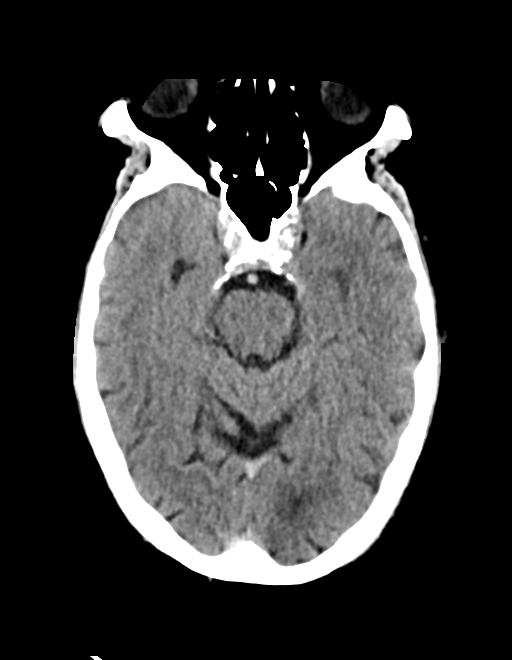
[im 17/34  brain]
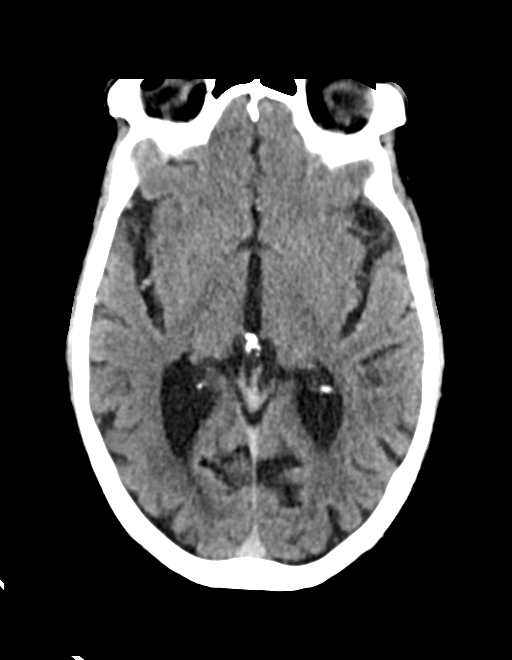
[im 21/34  brain]
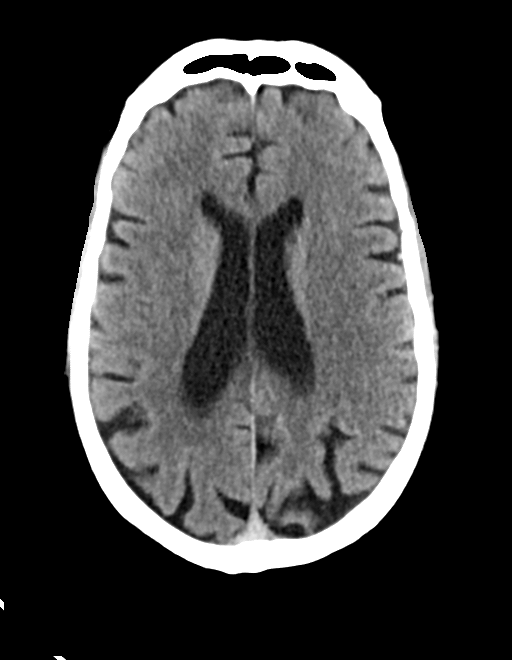
[im 21/34  bone]
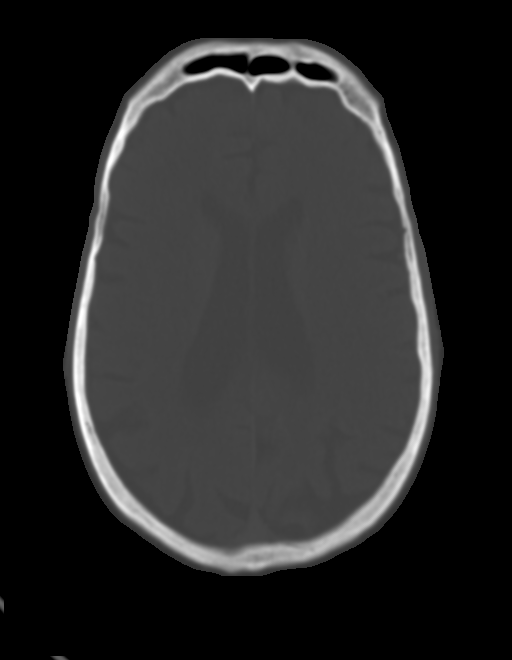
[im 25/34  brain]
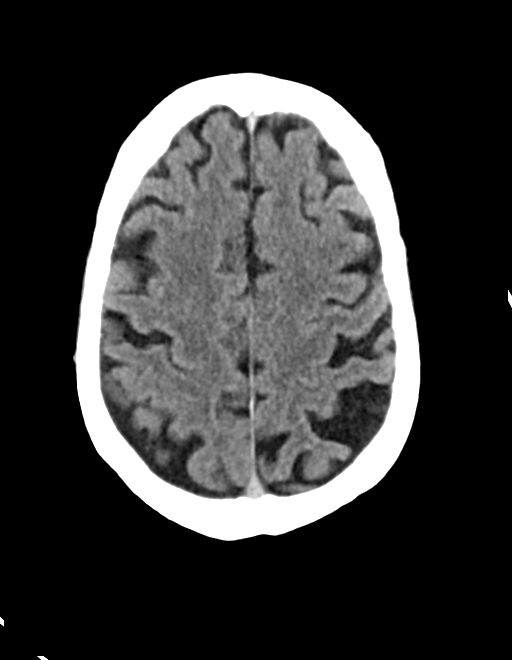
[im 29/34  brain]
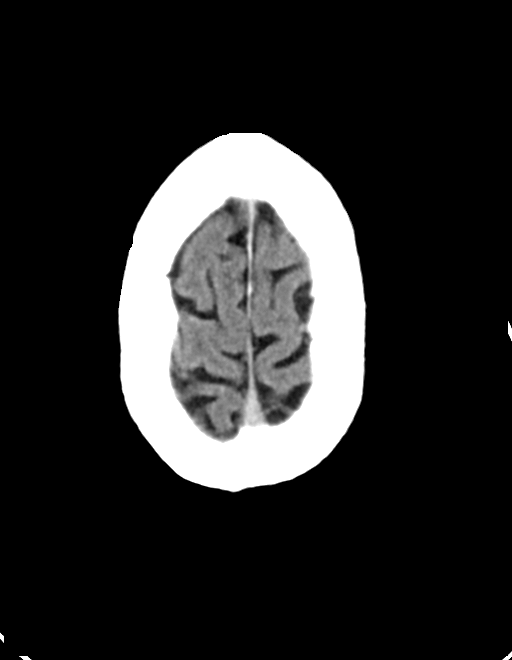

[Series 4: head bone · axial · 0.47mm/px · z∈[-227,-99]mm · 8 of 82 slices shown]
[im 9/82  bone]
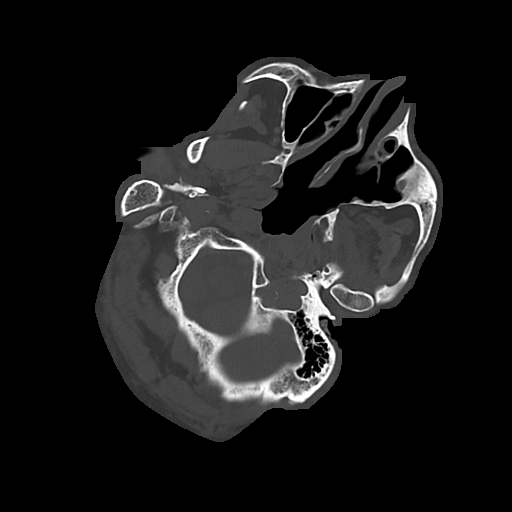
[im 17/82  bone]
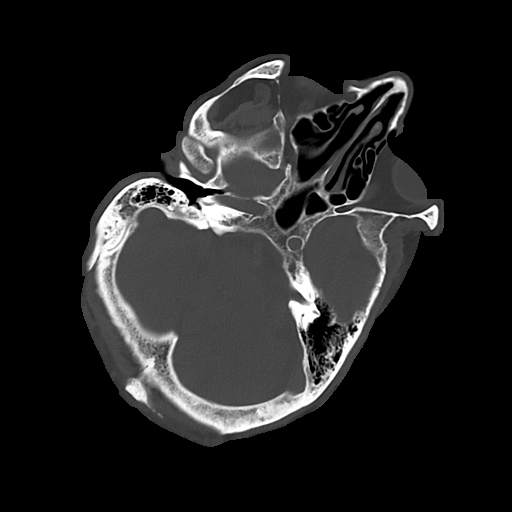
[im 25/82  bone]
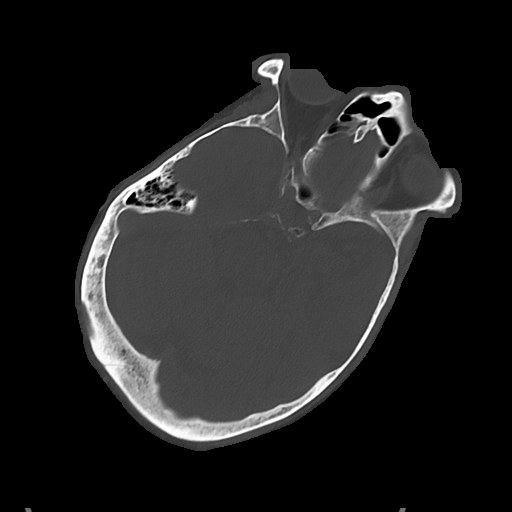
[im 37/82  bone]
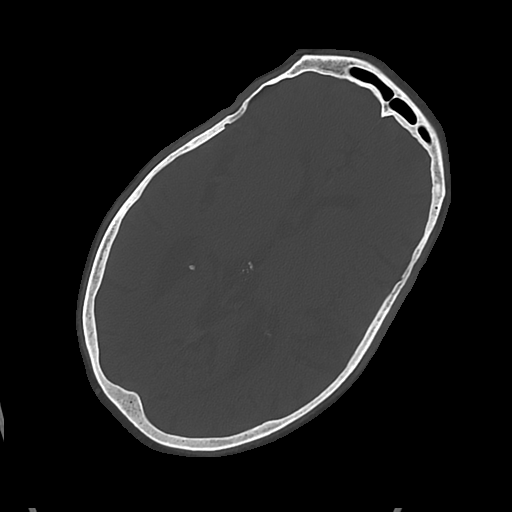
[im 45/82  bone]
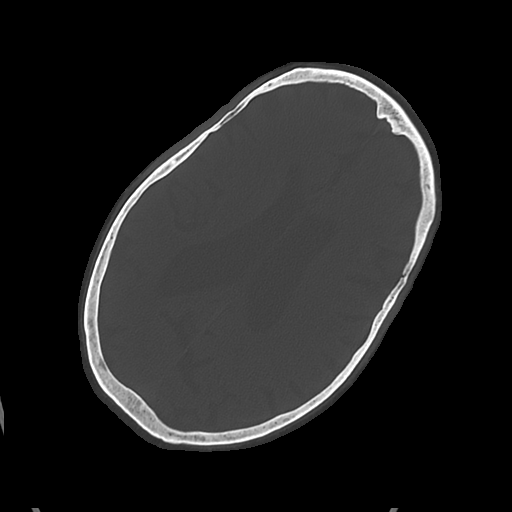
[im 57/82  bone]
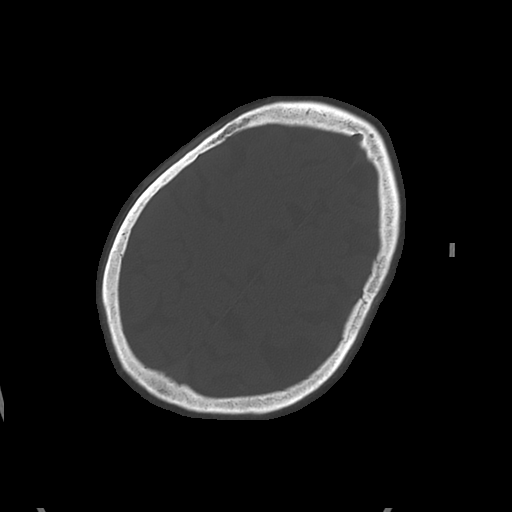
[im 65/82  bone]
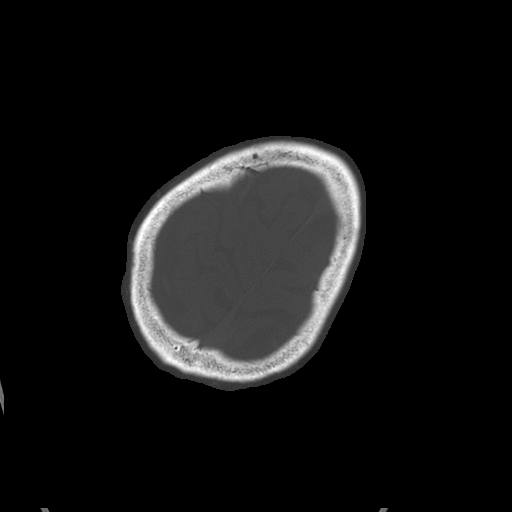
[im 73/82  bone]
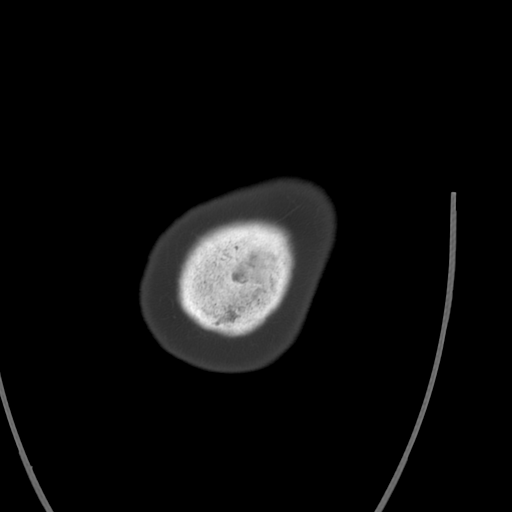

[15 of 30 positions shown; findings below may reference images not displayed]

FINDINGS: Brain: No acute intracranial findings are seen. There are no signs
of bleeding. Cortical sulci are prominent, more so in the left
cerebral hemisphere. There is decreased density in the
periventricular white matter. There are no definite signs of
bleeding within the cranium.

Vascular: There are scattered arterial calcifications.

Skull: Unremarkable.

Sinuses/Orbits: Unremarkable.

Other: None
IMPRESSION: No acute intracranial findings are seen in noncontrast CT brain.
Atrophy. Small-vessel disease.

## 2023-03-26 IMAGING — US US ABDOMEN LIMITED
1 series · 13 of 25 positions shown · non-contrast
Comparison: June 15, 2019.

CLINICAL DATA: Transaminitis.

EXAM:
ULTRASOUND ABDOMEN LIMITED RIGHT UPPER QUADRANT

[Series 1: us abdomen limited ruq (liver/gb) · 13 of 64 slices shown]
[im 1/64]
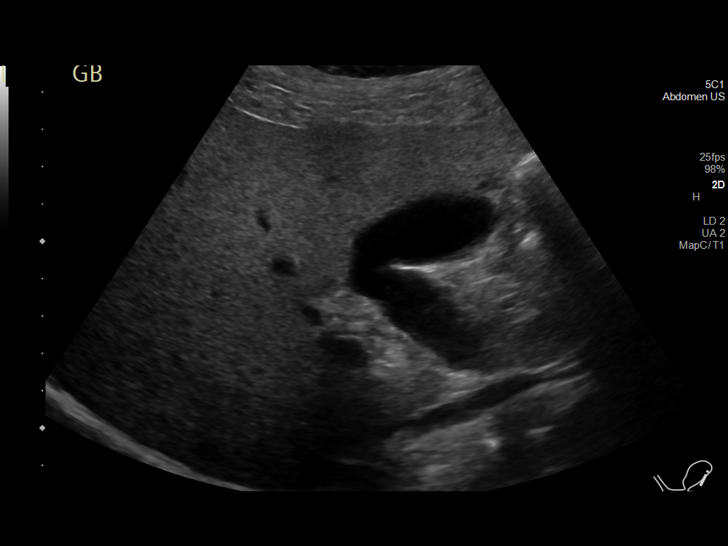
[im 6/64]
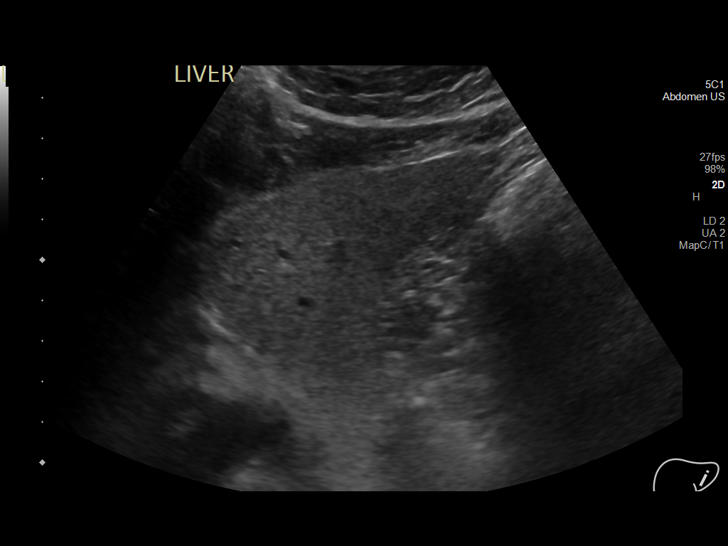
[im 11/64]
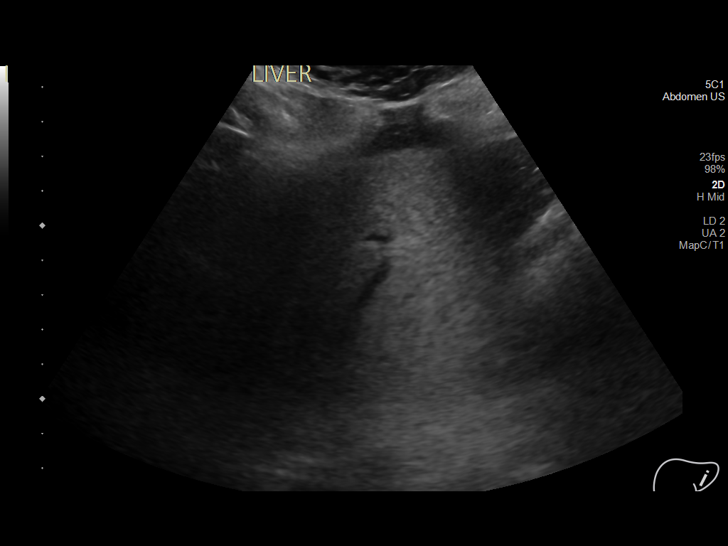
[im 16/64]
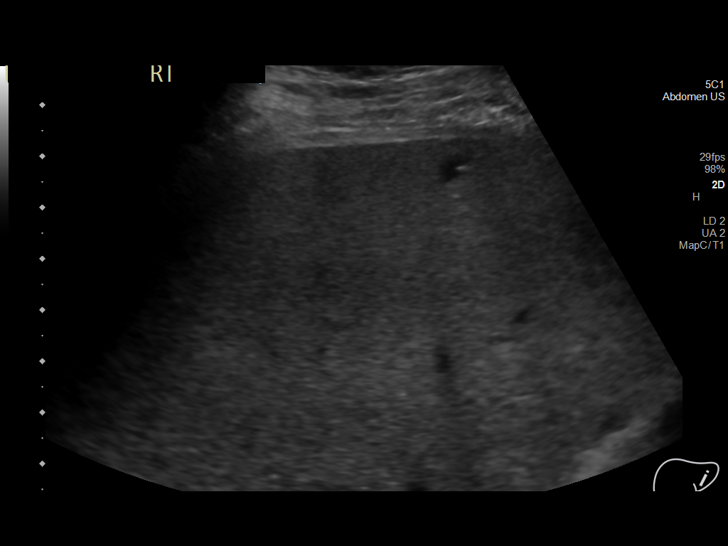
[im 22/64]
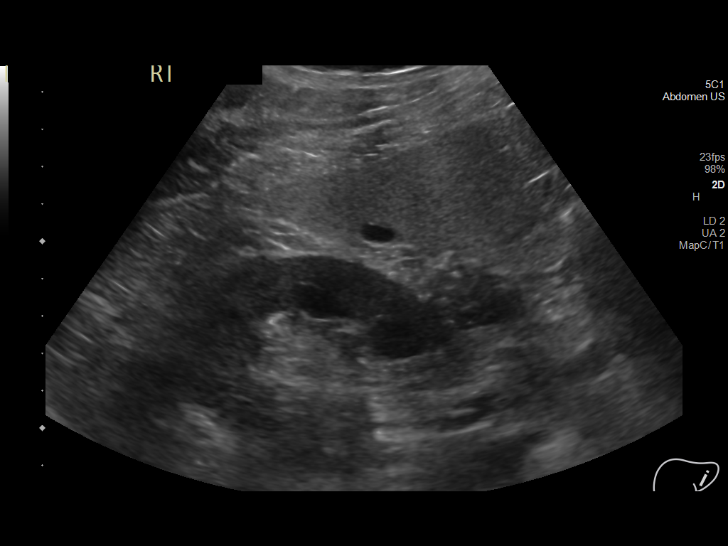
[im 27/64]
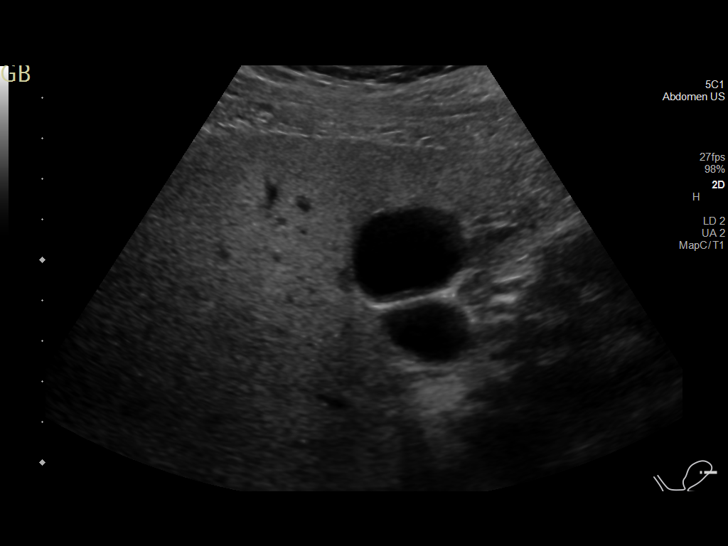
[im 32/64]
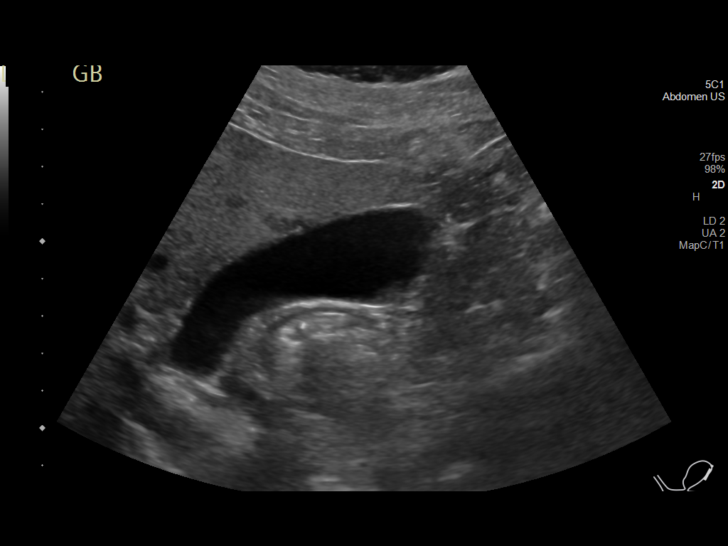
[im 37/64]
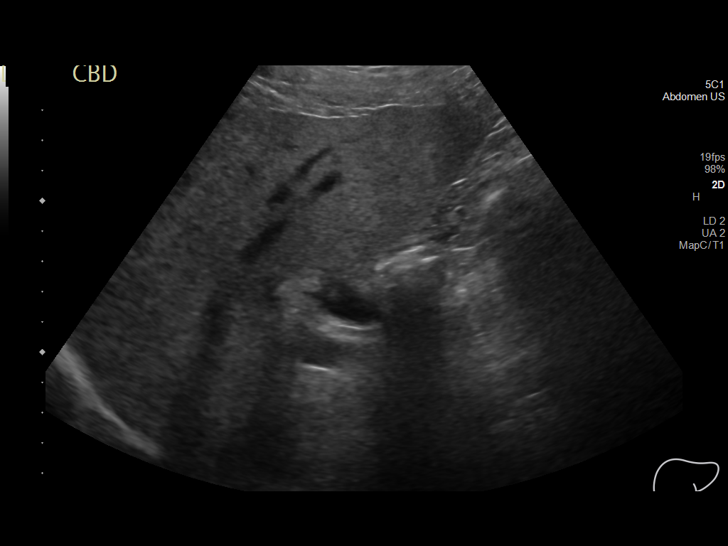
[im 43/64]
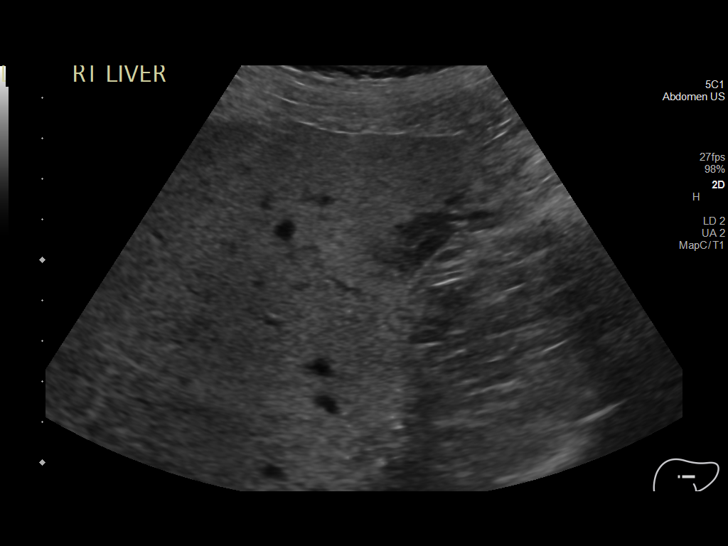
[im 48/64]
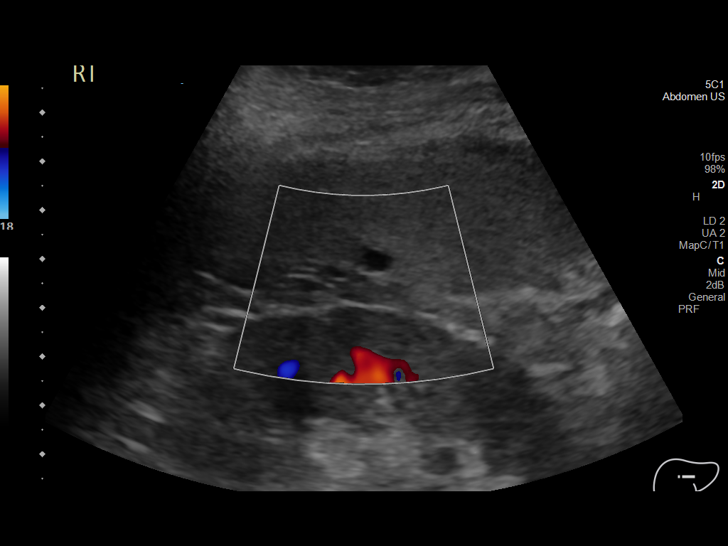
[im 53/64]
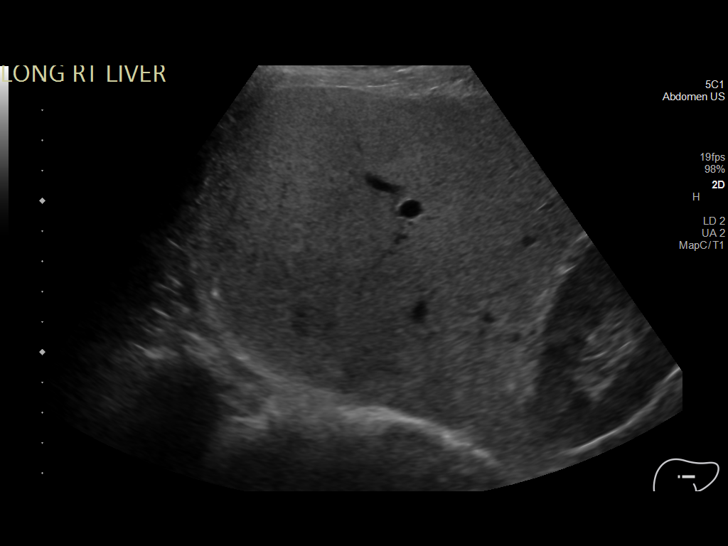
[im 58/64]
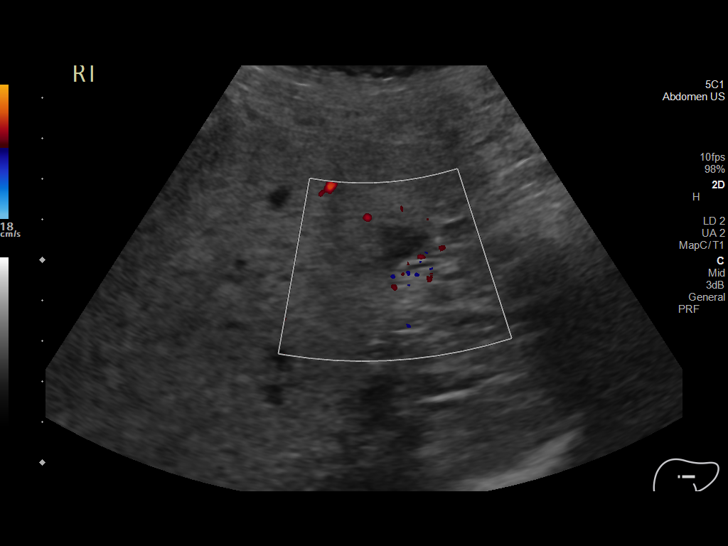
[im 64/64]
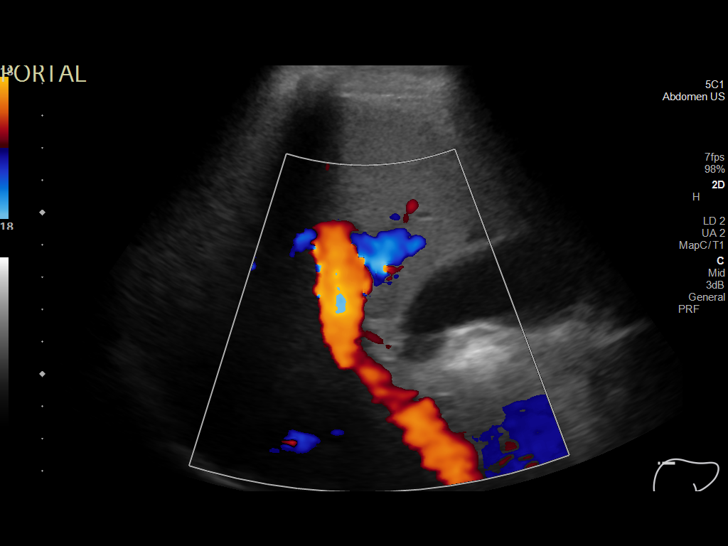

[13 of 25 positions shown; findings below may reference images not displayed]

FINDINGS: Gallbladder:

No gallstones or wall thickening visualized. No sonographic Murphy
sign noted by sonographer. Small amount of sludge is noted within
the gallbladder.

Common bile duct:

Diameter: 3 mm which is within normal limits.

Liver:

Increased echogenicity of hepatic parenchyma is noted suggesting
hepatic steatosis. At least 2 cysts are noted, 1 measuring 8 mm in
the other measuring 1 cm. 1.9 x 1.1 cm hypoechoic area is noted
along the right hepatic periphery which may represent fatty sparing,
but other pathology cannot excluded portal vein is patent on color
Doppler imaging with normal direction of blood flow towards the
liver.

Other: None.
IMPRESSION: Sludge noted within gallbladder lumen.

Increased echogenicity of hepatic parenchyma is noted suggesting
hepatic steatosis. At least 2 hepatic cysts are noted. 1.9 x 1.1 cm
hypoechoic area is noted along right hepatic periphery which may
represent fatty sparing, but other pathology cannot be excluded.
When the patient is clinically stable and able to follow directions
and hold their breath (preferably as an outpatient) further
evaluation with dedicated abdominal MRI should be considered.

## 2023-04-04 IMAGING — DX DG CHEST 1V PORT
1 series · 1 of 1 positions shown · non-contrast
Comparison: 01/03/2022

CLINICAL DATA: Altered mental status, nausea

EXAM:
PORTABLE CHEST 1 VIEW

[chest ap]
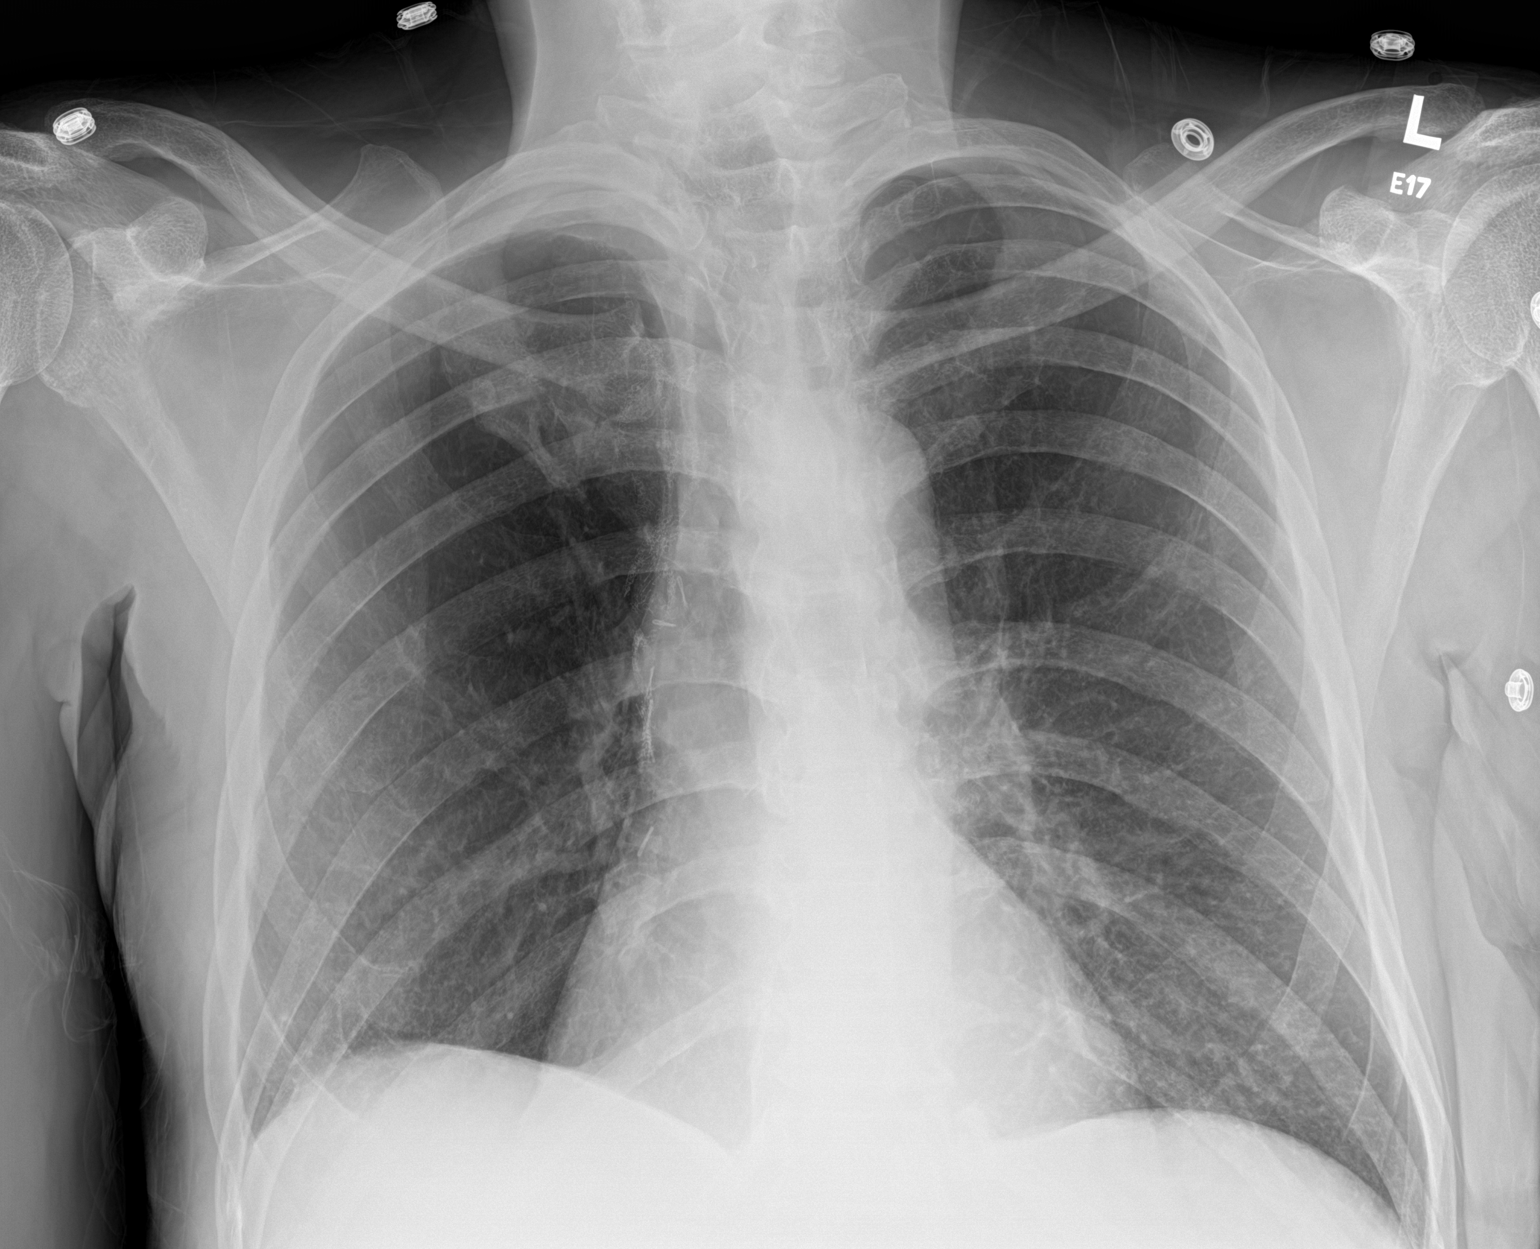

[1 of 1 positions shown; findings below may reference images not displayed]

FINDINGS: Unchanged cardiac and mediastinal contours. No new focal pulmonary
opacity. Redemonstrated sequela of prior right upper lobectomy. No
pleural effusion or pneumothorax. No acute osseous abnormality.
IMPRESSION: No acute cardiopulmonary process.
